# Patient Record
Sex: Male | Born: 1950
Health system: Southern US, Community
[De-identification: ages and names within clinical notes are randomized; demographics above are authoritative.]

## PROBLEM LIST (undated history)

## (undated) DIAGNOSIS — I499 Cardiac arrhythmia, unspecified: Secondary | ICD-10-CM

## (undated) DIAGNOSIS — N183 Chronic kidney disease, stage 3 unspecified: Secondary | ICD-10-CM

## (undated) DIAGNOSIS — I10 Essential (primary) hypertension: Secondary | ICD-10-CM

## (undated) DIAGNOSIS — I4891 Unspecified atrial fibrillation: Secondary | ICD-10-CM

## (undated) DIAGNOSIS — E785 Hyperlipidemia, unspecified: Secondary | ICD-10-CM

## (undated) DIAGNOSIS — M1990 Unspecified osteoarthritis, unspecified site: Secondary | ICD-10-CM

## (undated) DIAGNOSIS — E119 Type 2 diabetes mellitus without complications: Secondary | ICD-10-CM

## (undated) HISTORY — DX: Hyperlipidemia, unspecified: E78.5

## (undated) HISTORY — DX: Chronic kidney disease, stage 3 unspecified: N18.30

## (undated) HISTORY — PX: KIDNEY SURGERY: SHX687

---

## 2014-09-12 ENCOUNTER — Other Ambulatory Visit: Payer: Self-pay | Admitting: Physician Assistant

## 2014-09-12 DIAGNOSIS — M545 Low back pain: Secondary | ICD-10-CM

## 2014-09-15 ENCOUNTER — Other Ambulatory Visit: Payer: Self-pay

## 2017-10-26 ENCOUNTER — Encounter (HOSPITAL_COMMUNITY): Payer: Self-pay | Admitting: Emergency Medicine

## 2017-10-26 ENCOUNTER — Ambulatory Visit (HOSPITAL_COMMUNITY)
Admission: EM | Admit: 2017-10-26 | Discharge: 2017-10-26 | Disposition: A | Discharge status: Home / Self Care | Payer: 59 | Attending: Family Medicine | Admitting: Family Medicine

## 2017-10-26 ENCOUNTER — Ambulatory Visit (INDEPENDENT_AMBULATORY_CARE_PROVIDER_SITE_OTHER): Payer: 59

## 2017-10-26 DIAGNOSIS — M25561 Pain in right knee: Secondary | ICD-10-CM

## 2017-10-26 DIAGNOSIS — M25551 Pain in right hip: Secondary | ICD-10-CM

## 2017-10-26 DIAGNOSIS — M1711 Unilateral primary osteoarthritis, right knee: Secondary | ICD-10-CM

## 2017-10-26 HISTORY — DX: Unspecified osteoarthritis, unspecified site: M19.90

## 2017-10-26 HISTORY — DX: Type 2 diabetes mellitus without complications: E11.9

## 2017-10-26 HISTORY — DX: Essential (primary) hypertension: I10

## 2017-10-26 MED ORDER — CYCLOBENZAPRINE HCL 5 MG PO TABS: 5.0000 mg | ORAL_TABLET | Freq: Three times a day (TID) | ORAL | 0 refills | Status: DC | PRN

## 2017-10-26 MED ORDER — HYDROCODONE-ACETAMINOPHEN 7.5-325 MG PO TABS: 1.0000 | ORAL_TABLET | Freq: Four times a day (QID) | ORAL | 0 refills | Status: DC | PRN

## 2017-10-26 NOTE — Discharge Instructions (Signed)
Wear the knee immobilizer at all times that you are ambulatory/walking This will prevent another fall if your knee happens to get up Call your orthopedic in the morning to be seen this week Take pain medicine as needed.  Caution drowsiness.  Caution constipation. Take the muscle relaxer as needed for muscle spasm.  Caution drowsiness. Use ice on the injured knee to help take down the warmth and swelling

## 2017-10-26 NOTE — ED Triage Notes (Signed)
Pt states he has stage 3 arthritis in his R knee, c/o chronic pain, states he was walking and his R knee gave out and he fell on his bottom. Pt c/o severe R knee pain.

## 2017-10-26 NOTE — ED Provider Notes (Signed)
[facility]    CSN: [id number] Arrival date & time: 10/26/17  1809     History   Chief Complaint Chief Complaint  Patient presents with  . Appointment    6:18PM  . Knee Pain    HPI Calvin Schultz is a 67 y.o. male.   HPI  [name] has known arthritis in his knees.  Right greater than left.  He states his right knee has arthritis "bone-on-bone".  It is painful on a daily basis.  He was told 5 years ago that has needed to be replaced.  He states that a couple of days ago his right hip became painful.  No fall or injury.  No overuse.  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  He stayed home from work because of his pain, and was walking across the floor towards his kitchen.  Without provocation, his right knee buckled and gave out on him and he fell backwards landing on his backside.  He had some difficulty getting back up off of the floor that because of the pain in his hip and knee.  This happened yesterday.  Today the hip pain is slightly better, the knee pain is worse.  He cannot comfortably put weight on the right leg.  He has swelling in the knee, warmth in the knee, and swelling all the way down the leg to the ankle. He is an insulin-dependent diabetic.  He states his diabetes is well controlled.  He is compliant with his treatment.  He takes blood pressure medication. He called his orthopedic surgeon.  They are unable to see him today or tomorrow.  He is here for evaluation. Past Medical History:  Diagnosis Date  . Arthritis   . Diabetes mellitus without complication (HCC)   . Hypertension     There are no active problems to display for this patient.   Past Surgical History:  Procedure Laterality Date  . KIDNEY SURGERY         Home Medications    Prior to Admission medications   Medication Sig Start Date End Date Taking? Authorizing Provider  insulin aspart (NOVOLOG) 100 UNIT/ML injection  Inject into the skin 3 (three) times daily before meals.   Yes [provider]  insulin glargine (LANTUS) 100 UNIT/ML injection Inject into the skin at bedtime.   Yes [provider]  LISINOPRIL PO Take by mouth.   Yes [provider]  cyclobenzaprine (FLEXERIL) 5 MG tablet Take 1 tablet (5 mg total) by mouth 3 (three) times daily as needed for muscle spasms. 10/26/17   Calvin Moore, MD  HYDROcodone-acetaminophen (NORCO) 7.5-325 MG tablet Take 1 tablet by mouth every 6 (six) hours as needed for moderate pain. 10/26/17   Calvin Moore, MD    Family History No family history on file.  Social History Social History   Tobacco Use  . Smoking status: Not on file  Substance Use Topics  . Alcohol use: Not on file  . Drug use: Not on file     Allergies   Patient has no known allergies.   Review of Systems Review of Systems  Constitutional: Negative for chills and fever.  HENT: Negative for ear pain and sore throat.   Eyes: Negative for pain and visual disturbance.  Respiratory: Negative for cough and shortness of breath.   Cardiovascular: Negative for chest pain and palpitations.  Gastrointestinal: Negative for abdominal pain  and vomiting.  Genitourinary: Negative for dysuria and hematuria.  Musculoskeletal: Positive for arthralgias and gait problem. Negative for back pain.  Skin: Negative for color change and rash.  Neurological: Negative for seizures, syncope and headaches.  Psychiatric/Behavioral: Positive for sleep disturbance.       Pain kept him awake  All other systems reviewed and are negative.    Physical Exam Triage Vital Signs ED Triage Vitals [10/26/17 1827]  Enc Vitals Group     BP (!) 180/92     Pulse Rate 77     Resp 18     Temp 98.4 F (36.9 C)     Temp src      SpO2 97 %     Weight      Height      Head Circumference      Peak Flow      Pain Score      Pain Loc      Pain Edu?      Excl. in GC?    No data  found.  Updated Vital Signs BP (!) 180/92   Pulse 77   Temp 98.4 F (36.9 C)   Resp 18   SpO2 97%      Physical Exam  Constitutional: He appears well-developed and well-nourished. No distress.  Appears uncomfortable  HENT:  Head: Normocephalic and atraumatic.  Mouth/Throat: Oropharynx is clear and moist.  Eyes: Pupils are equal, round, and reactive to light. Conjunctivae are normal.  Neck: Normal range of motion.  Cardiovascular: Normal rate.  Pulmonary/Chest: Effort normal. No respiratory distress.  Abdominal: Soft. He exhibits no distension.  Musculoskeletal: Normal range of motion. He exhibits no edema.  Very antalgic.  Patient in wheelchair.  Can hardly put weight on right leg.  Right hip has no tenderness over the posterior pelvis, or greater trochanter.  Moderate hip pain with range of motion and external rotation.  Right knee has moderate effusion.  No tenderness to palpation.  Warmth.  No instability to examination but examination is limited due to patient's pain and large body habitus.  Neurological: He is alert.  Skin: Skin is warm and dry.     UC Treatments / Results  Labs (all labs ordered are listed, but only abnormal results are displayed) Labs Reviewed - No data to display  EKG None  Radiology Dg Knee Complete 4 Views Right  Result Date: 10/26/2017 CLINICAL DATA:  Knee pain after recent fall. EXAM: RIGHT KNEE - COMPLETE 4+ VIEW COMPARISON:  None. FINDINGS: Tricompartmental osteoarthritis with marked joint space narrowing and spurring is noted of the femorotibial and patellofemoral compartments. No significant joint effusion. No fracture or suspicious osseous lesions. Phleboliths are present along the posterior aspect of the proximal calf. IMPRESSION: Marked tricompartmental osteoarthritis of the right knee. Electronically Signed   By: Tollie Ethavid  Kwon M.D.   On: 10/26/2017 19:28   Dg Hip Unilat W Or Wo Pelvis 2-3 Views Right  Result Date: 10/26/2017 CLINICAL  DATA:  Patient fell this morning at the right hip and knee. Pain with nonweightbearing. EXAM: DG HIP (WITH OR WITHOUT PELVIS) 2-3V RIGHT COMPARISON:  None. FINDINGS: AP view the pelvis as well as AP and frog-leg views of the right hip are provided. The lower lumbar spine is maintained without significant disc flattening. There is mild facet arthropathy at L5-S1. No pelvic diastasis or fracture. Pubic rami are intact bilaterally. Femoral heads are seated within their respective acetabular components. No flattening of the femoral heads. No fracture of the  proximal femora. Small os acetabuli are noted bilaterally. IMPRESSION: No acute fracture or malalignment of either hip. Intact bony pelvis. Electronically Signed   By: Tollie Eth M.D.   On: 10/26/2017 19:27    Procedures Procedures (including critical care time)  Medications Ordered in UC Medications - No data to display  Initial Impression / Assessment and Plan / UC Course  I have reviewed the triage vital signs and the nursing notes.  Pertinent labs & imaging results that were available during my care of the patient were reviewed by me and considered in my medical decision making (see chart for details).      Final Clinical Impressions(s) / UC Diagnoses   Final diagnoses:  Acute pain of right knee  Osteoarthrosis, localized, primary, knee, right  Acute hip pain, right     Discharge Instructions     Wear the knee immobilizer at all times that you are ambulatory/walking This will prevent another fall if your knee happens to get up Call your orthopedic in the morning to be seen this week Take pain medicine as needed.  Caution drowsiness.  Caution constipation. Take the muscle relaxer as needed for muscle spasm.  Caution drowsiness. Use ice on the injured knee to help take down the warmth and swelling    ED Prescriptions    Medication Sig Dispense Auth. Provider   HYDROcodone-acetaminophen (NORCO) 7.5-325 MG tablet Take 1 tablet  by mouth every 6 (six) hours as needed for moderate pain. 20 tablet Calvin Moore, MD   cyclobenzaprine (FLEXERIL) 5 MG tablet Take 1 tablet (5 mg total) by mouth 3 (three) times daily as needed for muscle spasms. 30 tablet Calvin Moore, MD     Controlled Substance Prescriptions [place] Controlled Substance Registry consulted? Yes, I have consulted the [place] Controlled Substances Registry for this patient, and feel the risk/benefit ratio today is favorable for proceeding with this prescription for a controlled substance.  Prior prescriptions   Calvin Moore, MD 10/26/17 2047

## 2017-11-11 ENCOUNTER — Other Ambulatory Visit: Payer: Self-pay | Admitting: Family Medicine

## 2017-11-11 DIAGNOSIS — M1711 Unilateral primary osteoarthritis, right knee: Secondary | ICD-10-CM

## 2019-03-25 ENCOUNTER — Telehealth: Payer: Self-pay | Admitting: Hematology and Oncology

## 2019-03-25 NOTE — Telephone Encounter (Signed)
A new hem appt has been scheduled for Calvin Schultz to see Dr. Lorenso Courier on 12/2 at 2pm. Pt aware to arrive 15 minutes early.

## 2019-04-05 NOTE — Progress Notes (Deleted)
[place] [phone]   [phone]  INITIAL CONSULT NOTE  Patient Care Team: [facility] PCP - General (Family Medicine)  Hematological/Oncological History # ***  CHIEF COMPLAINTS/PURPOSE OF CONSULTATION:  "*** "  HISTORY OF PRESENTING ILLNESS:  Calvin Schultz 68 y.o. male with medical history significant for ***  MEDICAL HISTORY:  Past Medical History:  Diagnosis Date  . Arthritis   . Diabetes mellitus without complication ([place])   . Hypertension     SURGICAL HISTORY: Past Surgical History:  Procedure Laterality Date  . KIDNEY SURGERY      SOCIAL HISTORY: Social History   Socioeconomic History  . Marital status: Married    Spouse name: Not on file  . Number of children: Not on file  . Years of education: Not on file  . Highest education level: Not on file  Occupational History  . Not on file  Social Needs  . Financial resource strain: Not on file  . Food insecurity    Worry: Not on file    Inability: Not on file  . Transportation needs    Medical: Not on file    Non-medical: Not on file  Tobacco Use  . Smoking status: Not on file  Substance and Sexual Activity  . Alcohol use: Not on file  . Drug use: Not on file  . Sexual activity: Not on file  Lifestyle  . Physical activity    Days per week: Not on file    Minutes per session: Not on file  . Stress: Not on file  Relationships  . Social Herbalist on phone: Not on file    Gets together: Not on file    Attends religious service: Not on file    Active member of club or organization: Not on file    Attends meetings of clubs or organizations: Not on file    Relationship status: Not on file  . Intimate partner violence    Fear of current or ex partner: Not on file    Emotionally abused: Not on file    Physically abused: Not on file    Forced sexual activity: Not on file  Other Topics Concern  . Not on file  Social  History Narrative  . Not on file    FAMILY HISTORY: No family history on file.  ALLERGIES:  has No Known Allergies.  MEDICATIONS:  Current Outpatient Medications  Medication Sig Dispense Refill  . cyclobenzaprine (FLEXERIL) 5 MG tablet Take 1 tablet (5 mg total) by mouth 3 (three) times daily as needed for muscle spasms. 30 tablet 0  . HYDROcodone-acetaminophen (NORCO) 7.5-325 MG tablet Take 1 tablet by mouth every 6 (six) hours as needed for moderate pain. 20 tablet 0  . insulin aspart (NOVOLOG) 100 UNIT/ML injection Inject into the skin 3 (three) times daily before meals.    . insulin glargine (LANTUS) 100 UNIT/ML injection Inject into the skin at bedtime.    Marland Kitchen LISINOPRIL PO Take by mouth.     No current facility-administered medications for this visit.     REVIEW OF SYSTEMS:   Constitutional: ( - ) fevers, ( - )  chills , ( - ) night sweats Eyes: ( - ) blurriness of vision, ( - ) double vision, ( - ) watery eyes Ears, nose, mouth, throat, and face: ( - ) mucositis, ( - ) sore throat Respiratory: ( - ) cough, ( - ) dyspnea, ( - ) wheezes  Cardiovascular: ( - ) palpitation, ( - ) chest discomfort, ( - ) lower extremity swelling Gastrointestinal:  ( - ) nausea, ( - ) heartburn, ( - ) change in bowel habits Skin: ( - ) abnormal skin rashes Lymphatics: ( - ) new lymphadenopathy, ( - ) easy bruising Neurological: ( - ) numbness, ( - ) tingling, ( - ) new weaknesses Behavioral/Psych: ( - ) mood change, ( - ) new changes  All other systems were reviewed with the patient and are negative.  PHYSICAL EXAMINATION: ECOG PERFORMANCE STATUS: {CHL ONC ECOG PS:[id number]}  There were no vitals filed for this visit. There were no vitals filed for this visit.  GENERAL: well appearing *** in NAD  SKIN: skin color, texture, turgor are normal, no rashes or significant lesions EYES: conjunctiva are pink and non-injected, sclera clear OROPHARYNX: no exudate, no erythema; lips, buccal mucosa, and  tongue normal  NECK: supple, non-tender LYMPH:  no palpable lymphadenopathy in the cervical, axillary or inguinal LUNGS: clear to auscultation and percussion with normal breathing effort HEART: regular rate & rhythm and no murmurs and no lower extremity edema ABDOMEN: soft, non-tender, non-distended, normal bowel sounds Musculoskeletal: no cyanosis of digits and no clubbing  PSYCH: alert & oriented x 3, fluent speech NEURO: no focal motor/sensory deficits  LABORATORY DATA:  I have reviewed the data as listed No results found for: WBC, HGB, HCT, MCV, PLT, NEUTROABS  PATHOLOGY: ***  BLOOD FILM: *** Review of the peripheral blood smear showed normal appearing white cells with neutrophils that were appropriately lobated and granulated. There was no predominance of bi-lobed or hyper-segmented neutrophils appreciated. No Dohle bodies were noted. There was no left shifting, immature forms or blasts noted. Lymphocytes remain normal in size without any predominance of large granular lymphocytes. Red cells show no anisopoikilocytosis, macrocytes , microcytes or polychromasia. There were no schistocytes, target cells, echinocytes, acanthocytes, dacrocytes, or stomatocytes.There was no rouleaux formation, nucleated red cells, or intra-cellular inclusions noted. The platelets are normal in size, shape, and color without any clumping evident.  RADIOGRAPHIC STUDIES: I have personally reviewed the radiological images as listed and agreed with the findings in the report. No results found.  ASSESSMENT & PLAN ***  No orders of the defined types were placed in this encounter.   All questions were answered. The patient knows to call the clinic with any problems, questions or concerns.  A total of more than {CHL ONC TIME VISIT - [facility]:[id number]} were spent on this encounter and over half of that time was spent on counseling and coordination of care as outlined above.   Ulysees Barns, MD Department of  Hematology/Oncology [facility] Phone: [phone] Pager: [phone] Email: [email or web address]  04/05/2019 6:29 PM

## 2019-04-06 ENCOUNTER — Inpatient Hospital Stay: Payer: 59 | Attending: Hematology and Oncology | Admitting: Hematology and Oncology

## 2019-04-06 ENCOUNTER — Inpatient Hospital Stay: Payer: 59

## 2019-09-22 IMAGING — DX DG KNEE COMPLETE 4+V*R*
4 series · 4 of 4 positions shown · non-contrast
Comparison: None.

CLINICAL DATA: Knee pain after recent fall.

EXAM:
RIGHT KNEE - COMPLETE 4+ VIEW

[knee ap]
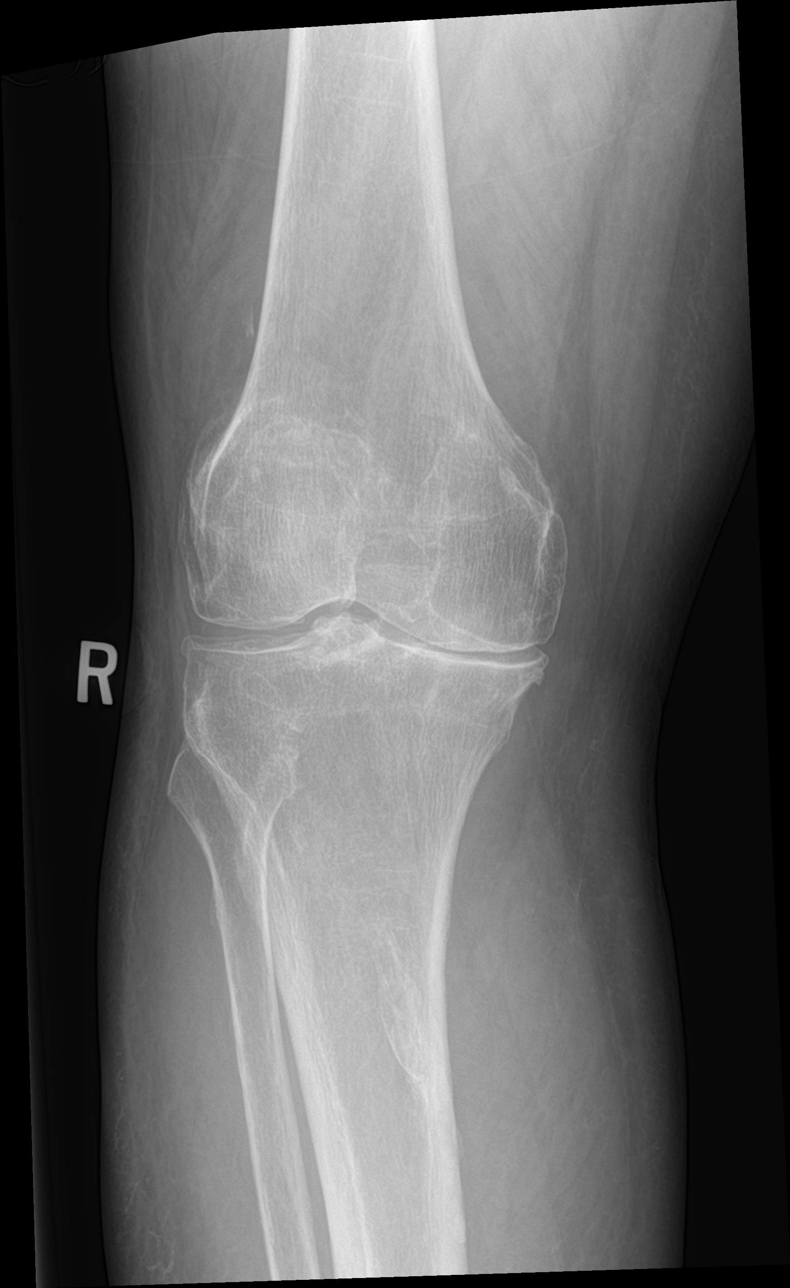

[knee obl (1 of 2)]
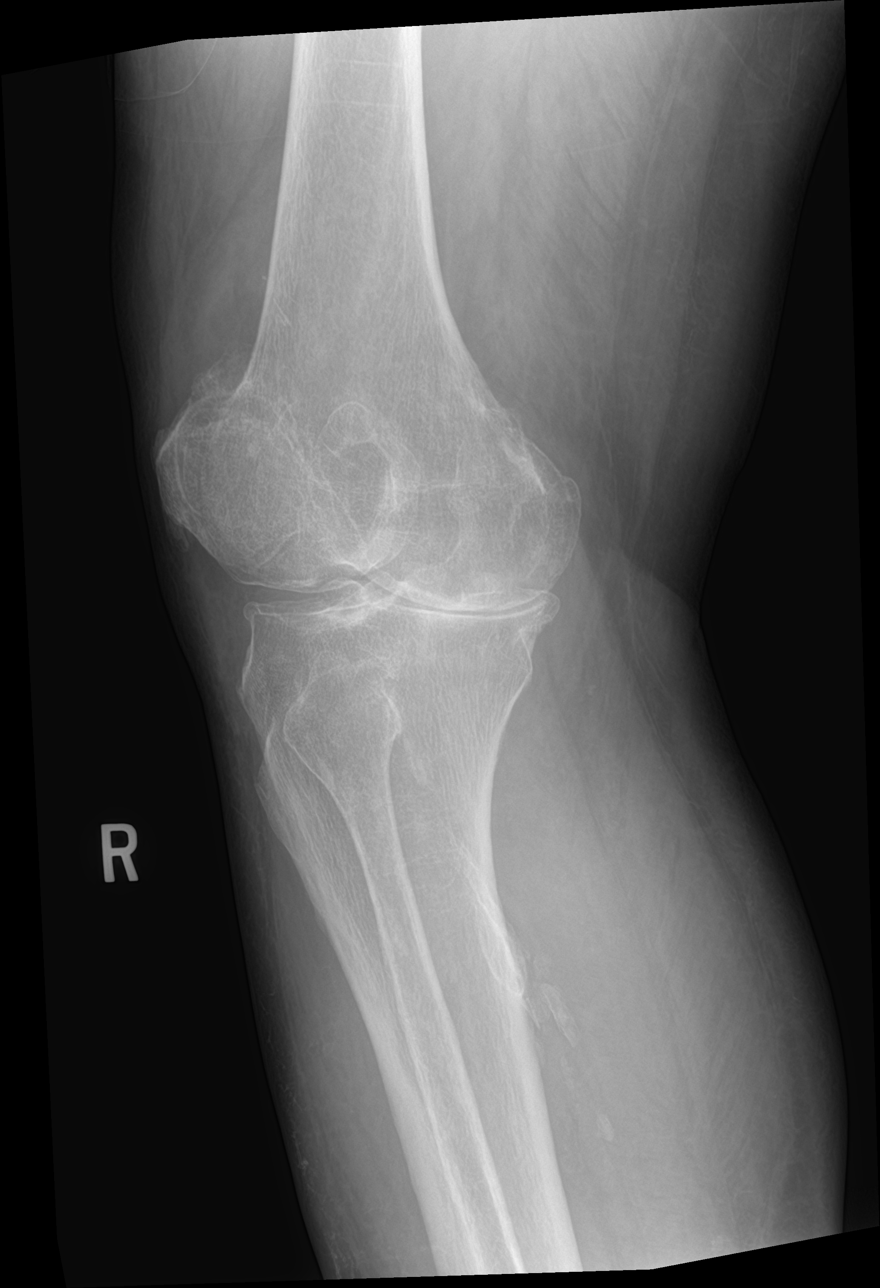

[knee obl (2 of 2)]
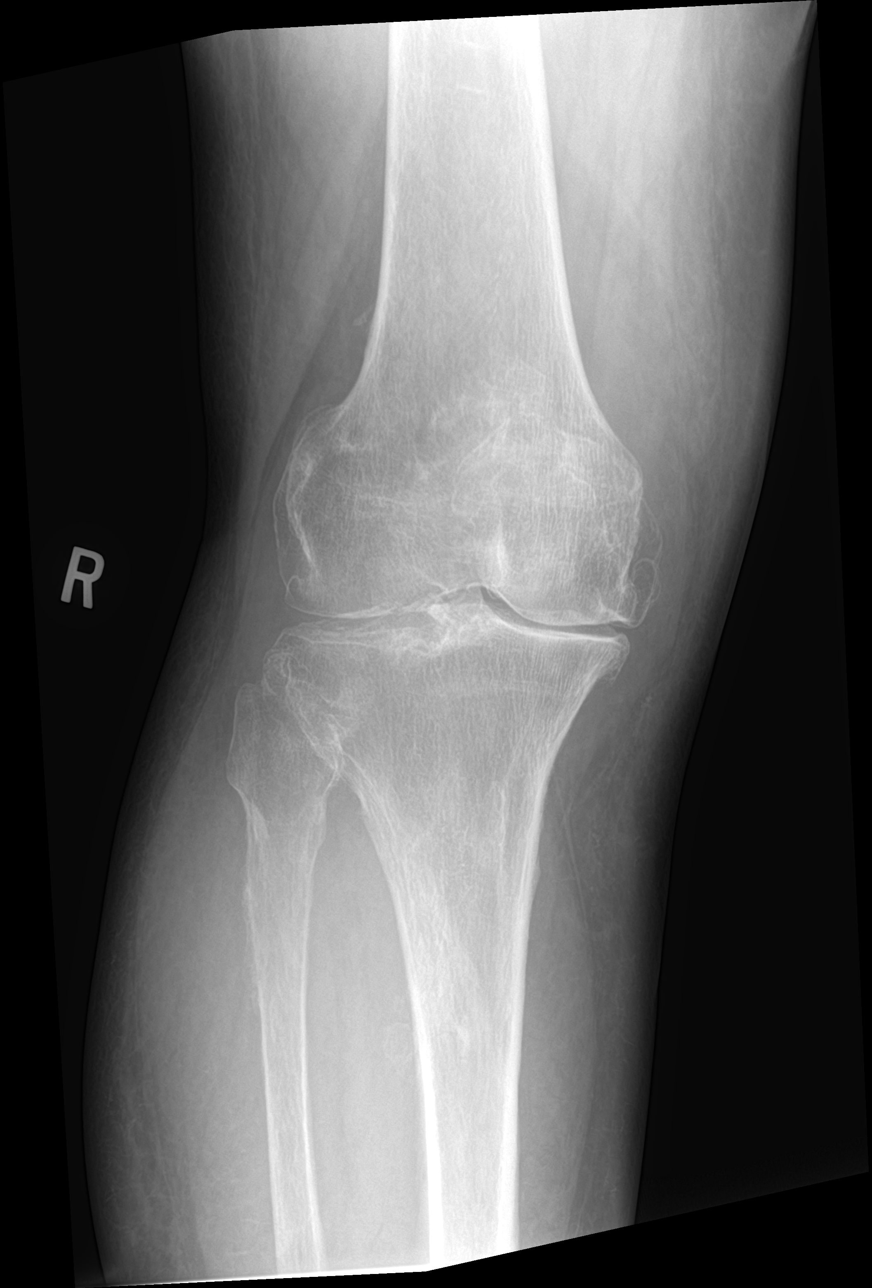

[knee lat]
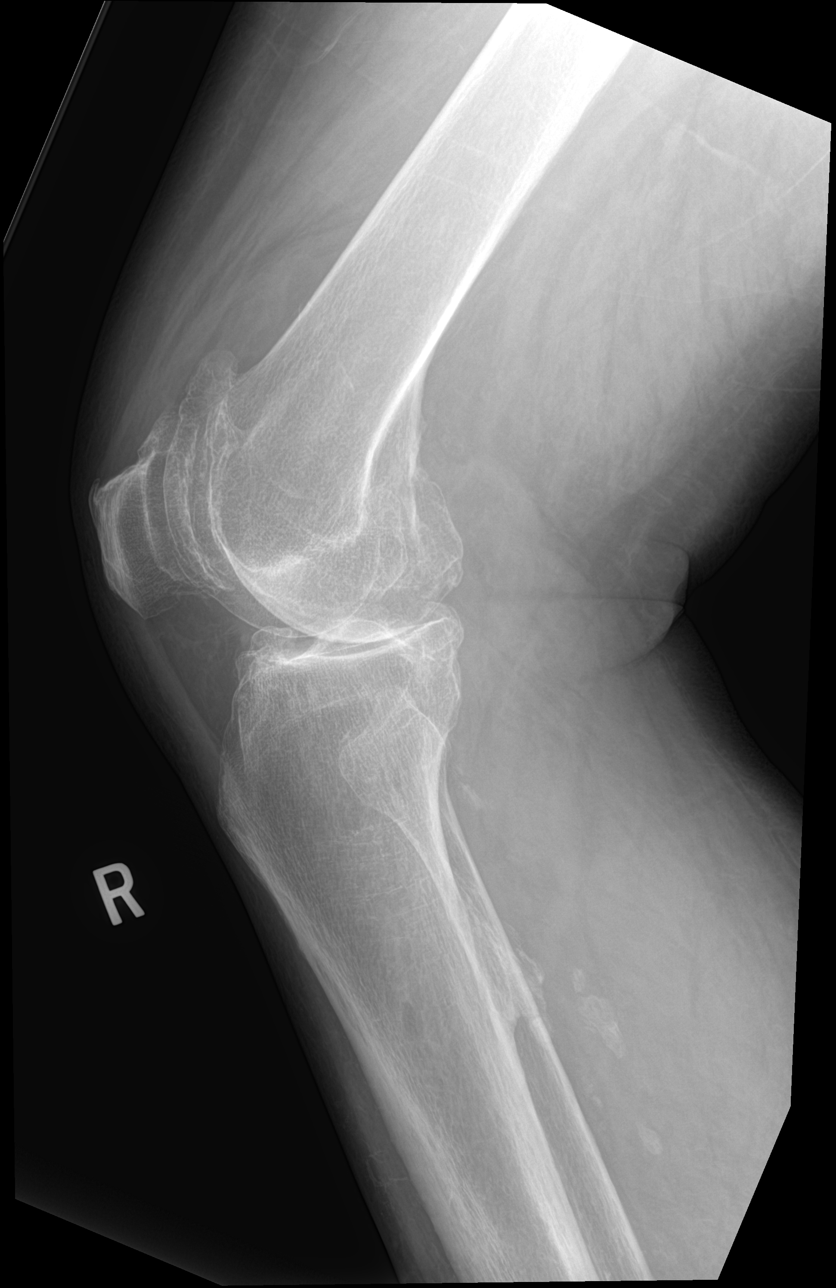

[4 of 4 positions shown; findings below may reference images not displayed]

FINDINGS: Tricompartmental osteoarthritis with marked joint space narrowing
and spurring is noted of the femorotibial and patellofemoral
compartments. No significant joint effusion. No fracture or
suspicious osseous lesions. Phleboliths are present along the
posterior aspect of the proximal calf.
IMPRESSION: Marked tricompartmental osteoarthritis of the right knee.

## 2020-01-20 ENCOUNTER — Ambulatory Visit: Payer: 59 | Admitting: Podiatry

## 2020-01-30 ENCOUNTER — Ambulatory Visit (INDEPENDENT_AMBULATORY_CARE_PROVIDER_SITE_OTHER): Payer: No Typology Code available for payment source | Admitting: Podiatry

## 2020-01-30 ENCOUNTER — Encounter: Payer: Self-pay | Admitting: Podiatry

## 2020-01-30 ENCOUNTER — Other Ambulatory Visit: Payer: Self-pay

## 2020-01-30 DIAGNOSIS — B351 Tinea unguium: Secondary | ICD-10-CM

## 2020-01-30 DIAGNOSIS — M79675 Pain in left toe(s): Secondary | ICD-10-CM | POA: Diagnosis not present

## 2020-01-30 DIAGNOSIS — L309 Dermatitis, unspecified: Secondary | ICD-10-CM | POA: Diagnosis not present

## 2020-01-30 DIAGNOSIS — M79674 Pain in right toe(s): Secondary | ICD-10-CM | POA: Diagnosis not present

## 2020-01-30 NOTE — Patient Instructions (Signed)
Diabetes Mellitus and Foot Care Foot care is an important part of your health, especially when you have diabetes. Diabetes may cause you to have problems because of poor blood flow (circulation) to your feet and legs, which can cause your skin to:  Become thinner and drier.  Break more easily.  Heal more slowly.  Peel and crack. You may also have nerve damage (neuropathy) in your legs and feet, causing decreased feeling in them. This means that you may not notice minor injuries to your feet that could lead to more serious problems. Noticing and addressing any potential problems early is the best way to prevent future foot problems. How to care for your feet Foot hygiene  Wash your feet daily with warm water and mild soap. Do not use hot water. Then, pat your feet and the areas between your toes until they are completely dry. Do not soak your feet as this can dry your skin.  Trim your toenails straight across. Do not dig under them or around the cuticle. File the edges of your nails with an emery board or nail file.  Apply a moisturizing lotion or petroleum jelly to the skin on your feet and to dry, brittle toenails. Use lotion that does not contain alcohol and is unscented. Do not apply lotion between your toes. Shoes and socks  Wear clean socks or stockings every day. Make sure they are not too tight. Do not wear knee-high stockings since they may decrease blood flow to your legs.  Wear shoes that fit properly and have enough cushioning. Always look in your shoes before you put them on to be sure there are no objects inside.  To break in new shoes, wear them for just a few hours a day. This prevents injuries on your feet. Wounds, scrapes, corns, and calluses  Check your feet daily for blisters, cuts, bruises, sores, and redness. If you cannot see the bottom of your feet, use a mirror or ask someone for help.  Do not cut corns or calluses or try to remove them with medicine.  If you  find a minor scrape, cut, or break in the skin on your feet, keep it and the skin around it clean and dry. You may clean these areas with mild soap and water. Do not clean the area with peroxide, alcohol, or iodine.  If you have a wound, scrape, corn, or callus on your foot, look at it several times a day to make sure it is healing and not infected. Check for: ? Redness, swelling, or pain. ? Fluid or blood. ? Warmth. ? Pus or a bad smell. General instructions  Do not cross your legs. This may decrease blood flow to your feet.  Do not use heating pads or hot water bottles on your feet. They may burn your skin. If you have lost feeling in your feet or legs, you may not know this is happening until it is too late.  Protect your feet from hot and cold by wearing shoes, such as at the beach or on hot pavement.  Schedule a complete foot exam at least once a year (annually) or more often if you have foot problems. If you have foot problems, report any cuts, sores, or bruises to your health care provider immediately. Contact a health care provider if:  You have a medical condition that increases your risk of infection and you have any cuts, sores, or bruises on your feet.  You have an injury that is not   healing.  You have redness on your legs or feet.  You feel burning or tingling in your legs or feet.  You have pain or cramps in your legs and feet.  Your legs or feet are numb.  Your feet always feel cold.  You have pain around a toenail. Get help right away if:  You have a wound, scrape, corn, or callus on your foot and: ? You have pain, swelling, or redness that gets worse. ? You have fluid or blood coming from the wound, scrape, corn, or callus. ? Your wound, scrape, corn, or callus feels warm to the touch. ? You have pus or a bad smell coming from the wound, scrape, corn, or callus. ? You have a fever. ? You have a red line going up your leg. Summary  Check your feet every day  for cuts, sores, red spots, swelling, and blisters.  Moisturize feet and legs daily.  Wear shoes that fit properly and have enough cushioning.  If you have foot problems, report any cuts, sores, or bruises to your health care provider immediately.  Schedule a complete foot exam at least once a year (annually) or more often if you have foot problems. This information is not intended to replace advice given to you by your health care provider. Make sure you discuss any questions you have with your health care provider. Document Revised: 01/12/2019 Document Reviewed: 05/23/2016 Elsevier Patient Education  2020 [employer]  

## 2020-01-30 NOTE — Progress Notes (Signed)
Subjective:   Patient ID: Calvin Schultz, male   DOB: 69 y.o.   MRN: [id number]   HPI Patient is a long-term diabetic with neuropathic-like symptomatology and numbness who has nail disease 1-5 both feet that get thick and distal formed and states that at times he cannot take care of them or his family and is concerned about his neuropathy.  Also complains of tissue that gets irritated and red.  Patient does not smoke likes to be active   Review of Systems  All other systems reviewed and are negative.       Objective:  Physical Exam Vitals and nursing note reviewed.  Constitutional:      Appearance: He is well-developed.  Pulmonary:     Effort: Pulmonary effort is normal.  Musculoskeletal:        General: Normal range of motion.  Skin:    General: Skin is warm.  Neurological:     Mental Status: He is alert.     Neurovascular status found to be intact but does have diminishment of sharp dull vibratory bilateral with redness around both feet in a moccasin-like appearance with dry type skin.  He is also noted to have thick yellow brittle nailbeds 1-5 both feet that are tender when pressed and make shoe gear difficult and states he tries to take care of them but is worried about doing it due to his diabetes and neuropathic-like condition.  Patient has good digital perfusion and he is well oriented     Assessment:  Mycotic nail infection 1-5 both feet long-term diabetes and what appears to be moderate fungal infection     Plan:  H&P diabetic foot education given to patient and discussed and I debrided nailbeds 1-5 both feet with no iatrogenic bleeding.  As far as his skin goes I do not recommend oral medicine but did discuss possible Lamisil we also discussed medicine if he were start to develop burning or shooting pain that is causing sleep issues or more chronic like discomfort.  Patient will be seen back as needed

## 2021-04-19 ENCOUNTER — Other Ambulatory Visit: Payer: Self-pay

## 2021-04-19 ENCOUNTER — Ambulatory Visit (INDEPENDENT_AMBULATORY_CARE_PROVIDER_SITE_OTHER): Payer: No Typology Code available for payment source

## 2021-04-19 ENCOUNTER — Ambulatory Visit (INDEPENDENT_AMBULATORY_CARE_PROVIDER_SITE_OTHER): Payer: No Typology Code available for payment source | Admitting: Podiatry

## 2021-04-19 DIAGNOSIS — L97512 Non-pressure chronic ulcer of other part of right foot with fat layer exposed: Secondary | ICD-10-CM

## 2021-04-19 DIAGNOSIS — E11621 Type 2 diabetes mellitus with foot ulcer: Secondary | ICD-10-CM | POA: Diagnosis not present

## 2021-04-19 DIAGNOSIS — L97412 Non-pressure chronic ulcer of right heel and midfoot with fat layer exposed: Secondary | ICD-10-CM | POA: Diagnosis not present

## 2021-04-19 MED ORDER — SILVER SULFADIAZINE 1 % EX CREA: TOPICAL_CREAM | CUTANEOUS | 0 refills | Status: DC

## 2021-04-19 MED ORDER — KETOCONAZOLE 2 % EX CREA: TOPICAL_CREAM | CUTANEOUS | 0 refills | Status: DC

## 2021-04-19 MED ORDER — DOXYCYCLINE HYCLATE 100 MG PO TABS: 100.0000 mg | ORAL_TABLET | Freq: Two times a day (BID) | ORAL | 0 refills | Status: DC

## 2021-04-19 NOTE — Progress Notes (Signed)
°  Subjective:  Patient ID: Calvin Schultz, male    DOB: 09/26/1950,  MRN: [id number]  Chief Complaint  Patient presents with   Foot Ulcer    Right first submet   70 y.o. male presents for wound care. Hx confirmed with patient. States he has had this ulcer for a few weeks. Unsure how this started. Denies N/V/F/Ch. Objective:  Physical Exam: Wound Location: right submet 1 Wound Measurement: 1.5x1.5 post-debridement Wound Base: Granular/Healthy Peri-wound: Calloused Exudate: wound dry + induration, + tenderness, and no undermining Foot warm and well perfused, pedal pulses palpable.  No images are attached to the encounter.  Radiographs:  X-ray of the right foot: no soft tissue emphysema, no signs of osteomyelitis; mild degenerative changes 1st metatarsophalangeal joint. Assessment:   1. Diabetic ulcer of right midfoot associated with type 2 diabetes mellitus, with fat layer exposed (HCC)    Plan:  Patient was evaluated and treated and all questions answered.  Ulcer right foot -XR reviewed with patient -Wound cleansed and debrided -Dressing applied consisting of silvadene, sterile gauze, kerlix, and ACE bandage -Offload ulcer with CAM boot -CAM boot dispensed  Procedure: Excisional Debridement of Wound Indication: Removal of non-viable soft tissue from the wound to promote healing.  Anesthesia: none Pre-Debridement Wound Measurements: 1 cm x 1 cm x 0.3 cm  Post-Debridement Wound Measurements: 1.5x1.5 x 0.3 cm  Type of Debridement: Sharp Excisional Tissue Removed: Non-viable soft tissue Instrumentation: 15 blade and tissue nipper Depth of Debridement: subcutaneous tissue. Technique: Sharp excisional debridement to bleeding, viable wound base.  Dressing: Dry, sterile, compression dressing. Disposition: Patient tolerated procedure well.  Return in about 2 weeks (around 05/03/2021) for Wound Care.

## 2021-05-10 ENCOUNTER — Other Ambulatory Visit: Payer: Self-pay

## 2021-05-10 ENCOUNTER — Ambulatory Visit (INDEPENDENT_AMBULATORY_CARE_PROVIDER_SITE_OTHER): Payer: No Typology Code available for payment source | Admitting: Podiatry

## 2021-05-10 DIAGNOSIS — L97412 Non-pressure chronic ulcer of right heel and midfoot with fat layer exposed: Secondary | ICD-10-CM | POA: Diagnosis not present

## 2021-05-10 DIAGNOSIS — E11621 Type 2 diabetes mellitus with foot ulcer: Secondary | ICD-10-CM

## 2021-05-13 NOTE — Progress Notes (Signed)
°  Subjective:  Patient ID: Calvin Schultz, male    DOB: 1951-03-21,  MRN: [id number]  Chief Complaint  Patient presents with   Diabetic Ulcer     Wound Care 2WK F/U   71 y.o. male presents for wound care. Hx confirmed with patient. Wound doing better per patient. Reports some clear dainage. Apply silvadene daily to wound. Oral temp 98.2 Objective:  Physical Exam: Wound Location: right submet 1 Wound Measurement: 1x1 Wound Base: Granular/Healthy Peri-wound: Calloused Exudate: wound dry + induration, + tenderness, and no undermining Foot warm and well perfused, pedal pulses palpable.  No images are attached to the encounter.  Radiographs:  X-ray of the right foot: no soft tissue emphysema, no signs of osteomyelitis; mild degenerative changes 1st metatarsophalangeal joint. Assessment:   1. Diabetic ulcer of right midfoot associated with type 2 diabetes mellitus, with fat layer exposed (HCC)     Plan:  Patient was evaluated and treated and all questions answered.  Ulcer right foot -Wound improving.  -Wound cleansed and minimally debrided -Dressing applied consisting of silvadene and foam border dressing. -Offload ulcer with CAM boot    No follow-ups on file.

## 2021-05-24 ENCOUNTER — Ambulatory Visit: Payer: No Typology Code available for payment source | Admitting: Podiatry

## 2021-05-28 ENCOUNTER — Other Ambulatory Visit: Payer: Self-pay

## 2021-05-28 ENCOUNTER — Ambulatory Visit (INDEPENDENT_AMBULATORY_CARE_PROVIDER_SITE_OTHER): Payer: No Typology Code available for payment source | Admitting: Podiatry

## 2021-05-28 DIAGNOSIS — E11621 Type 2 diabetes mellitus with foot ulcer: Secondary | ICD-10-CM

## 2021-05-28 DIAGNOSIS — L97412 Non-pressure chronic ulcer of right heel and midfoot with fat layer exposed: Secondary | ICD-10-CM | POA: Diagnosis not present

## 2021-05-28 NOTE — Patient Instructions (Signed)
I would recommend looking for compression socks that say 15 - 20 mmHg if the 20-30 mm Hg is too tight.

## 2021-05-28 NOTE — Progress Notes (Signed)
°  Subjective:  Patient ID: Calvin Schultz, male    DOB: Jan 26, 1951,  MRN: [id number]  No chief complaint on file.  71 y.o. male presents for wound care. Hx confirmed with patient. Thinks the wound is doing better. Minimal clear drainage daily - changing daily with silvadene and gauze. Pain rated 4/10. Oral temperature 97.8 Objective:  Physical Exam: Wound Location: right submet 1 Wound Measurement: 1x1.5 Wound Base: Granular/Healthy Peri-wound: Calloused Exudate: wound dry + induration, + tenderness, and no undermining Foot warm and well perfused, pedal pulses palpable.  No images are attached to the encounter.  Radiographs:  X-ray of the right foot: no soft tissue emphysema, no signs of osteomyelitis; mild degenerative changes 1st metatarsophalangeal joint. Assessment:   1. Diabetic ulcer of right midfoot associated with type 2 diabetes mellitus, with fat layer exposed ([place])    Plan:  Patient was evaluated and treated and all questions answered.  Ulcer right foot -Wound slightly larger than previous. -Wound cleansed and debrided to healthy bleeding wound. -Start [employer] for collagen scaffold to promote healing. Applied today. -Will verify benefits for advanced wound therapy to promote healing. -Offload ulcer with CAM boot  -Dressing supplies ordered from [employer]  Procedure: Excisional Debridement of Wound Indication: Removal of non-viable soft tissue from the wound to promote healing.  Anesthesia: none Pre-Debridement Wound Measurements: 1 cm x 1 cm x 0.3 cm  Post-Debridement Wound Measurements: 1 cm x 1.5 cm x 0.3 cm  Type of Debridement: Sharp Excisional Tissue Removed: Non-viable soft tissue Instrumentation: 15 blade and tissue nipper Depth of Debridement: subcutaneous tissue. Technique: Sharp excisional debridement to bleeding, viable wound base.  Dressing: Dry, sterile, compression dressing. Disposition: Patient tolerated procedure well.  Return in about 3 weeks  (around 06/18/2021) for Wound Care.

## 2021-06-03 ENCOUNTER — Telehealth: Payer: Self-pay | Admitting: Podiatry

## 2021-06-03 NOTE — Telephone Encounter (Signed)
Patient was seen in the office on 05/28/21 he states that he was told that some supplies would be ordered for him. Patient has not heard or received anything at this time and was told to call back if has not heard anything in a few days. Please advise

## 2021-06-03 NOTE — Telephone Encounter (Signed)
Ammie can we call [facility] to ensure they got the order and check on the status of it?

## 2021-06-04 ENCOUNTER — Telehealth: Payer: Self-pay | Admitting: *Deleted

## 2021-06-04 NOTE — Telephone Encounter (Signed)
New order form filled out please send

## 2021-06-04 NOTE — Telephone Encounter (Signed)
Called [facility] and spoke with [place], do not have order but if refax tonight, will try to have to patient in 48 hours. Notified patient,verbalized understanding and said that he may have enough until then.

## 2021-06-04 NOTE — Telephone Encounter (Addendum)
Patient called again , he is using the last of the supplies he had, doesn't know what he is suppose to do until the rest of the supplies get there, do you have an update?  Patient is requesting a call back today if possible.

## 2021-06-04 NOTE — Telephone Encounter (Signed)
Patient called again regarding supplies have not shown up, supply company is saying they do not have order?

## 2021-06-05 NOTE — Telephone Encounter (Signed)
Faxed to [facility] 06/04/21

## 2021-06-05 NOTE — Telephone Encounter (Signed)
[facility] order sent,patient notified

## 2021-06-21 ENCOUNTER — Ambulatory Visit: Payer: No Typology Code available for payment source | Admitting: Podiatry

## 2021-06-24 ENCOUNTER — Other Ambulatory Visit: Payer: Self-pay

## 2021-06-24 ENCOUNTER — Ambulatory Visit (INDEPENDENT_AMBULATORY_CARE_PROVIDER_SITE_OTHER): Payer: No Typology Code available for payment source

## 2021-06-24 ENCOUNTER — Ambulatory Visit (INDEPENDENT_AMBULATORY_CARE_PROVIDER_SITE_OTHER): Payer: No Typology Code available for payment source | Admitting: Podiatry

## 2021-06-24 DIAGNOSIS — L97512 Non-pressure chronic ulcer of other part of right foot with fat layer exposed: Secondary | ICD-10-CM

## 2021-06-25 ENCOUNTER — Telehealth: Payer: No Typology Code available for payment source | Admitting: *Deleted

## 2021-06-25 ENCOUNTER — Ambulatory Visit: Payer: No Typology Code available for payment source

## 2021-06-25 DIAGNOSIS — L97512 Non-pressure chronic ulcer of other part of right foot with fat layer exposed: Secondary | ICD-10-CM

## 2021-06-25 DIAGNOSIS — L97412 Non-pressure chronic ulcer of right heel and midfoot with fat layer exposed: Secondary | ICD-10-CM

## 2021-06-25 DIAGNOSIS — E11621 Type 2 diabetes mellitus with foot ulcer: Secondary | ICD-10-CM

## 2021-06-25 NOTE — Progress Notes (Signed)
SITUATION Reason for Consult: Follow-up with right CAM boot Patient / Caregiver Report: Patient needs offloading  OBJECTIVE DATA History / Diagnosis:    ICD-10-CM   1. Diabetic ulcer of right midfoot associated with type 2 diabetes mellitus, with fat layer exposed (HCC)  E11.621    L97.412     2. Ulcer of right foot with fat layer exposed (HCC)  L97.512       Change in Pathology: None  ACTIONS PERFORMED Patient's equipment was checked for structural stability and fit. Offloaded cam boot and reinstructed patient and wife in donning and doffing. Device(s) intact and fit is excellent. All questions answered and concerns addressed.  PLAN Follow-up as needed (PRN). Plan of care discussed with and agreed upon by patient / caregiver.

## 2021-06-25 NOTE — Telephone Encounter (Signed)
Prior authorization is needed for the [employer] ABI with /without TBI. Please contact Duncan when completed([phone]).

## 2021-06-26 LAB — BASIC METABOLIC PANEL
BUN/Creatinine Ratio: 11 (ref 10–24)
BUN: 22 mg/dL (ref 8–27)
CO2: 22 mmol/L (ref 20–29)
Calcium: 9.8 mg/dL (ref 8.6–10.2)
Chloride: 102 mmol/L (ref 96–106)
Creatinine, Ser: 2 mg/dL — ABNORMAL HIGH (ref 0.76–1.27)
Glucose: 275 mg/dL — ABNORMAL HIGH (ref 70–99)
Potassium: 3.5 mmol/L (ref 3.5–5.2)
Sodium: 139 mmol/L (ref 134–144)
eGFR: 36 mL/min/{1.73_m2} — ABNORMAL LOW (ref >59)

## 2021-06-26 LAB — CBC WITH DIFFERENTIAL/PLATELET
Basophils Absolute: 0.1 10*3/uL (ref 0.0–0.2)
Basos: 0 %
EOS (ABSOLUTE): 0.2 10*3/uL (ref 0.0–0.4)
Eos: 1 %
Hematocrit: 41.2 % (ref 37.5–51.0)
Hemoglobin: 13.2 g/dL (ref 13.0–17.7)
Immature Grans (Abs): 0 10*3/uL (ref 0.0–0.1)
Immature Granulocytes: 0 %
Lymphocytes Absolute: 2.4 10*3/uL (ref 0.7–3.1)
Lymphs: 17 %
MCH: 26.8 pg (ref 26.6–33.0)
MCHC: 32 g/dL (ref 31.5–35.7)
MCV: 84 fL (ref 79–97)
Monocytes Absolute: 1 10*3/uL — ABNORMAL HIGH (ref 0.1–0.9)
Monocytes: 8 %
Neutrophils Absolute: 10.1 10*3/uL — ABNORMAL HIGH (ref 1.4–7.0)
Neutrophils: 74 %
Platelets: 367 10*3/uL (ref 150–450)
RBC: 4.92 x10E6/uL (ref 4.14–5.80)
RDW: 12.6 % (ref 11.6–15.4)
WBC: 13.7 10*3/uL — ABNORMAL HIGH (ref 3.4–10.8)

## 2021-06-26 LAB — SEDIMENTATION RATE: Sed Rate: 38 mm/hr — ABNORMAL HIGH (ref 0–30)

## 2021-06-26 LAB — C-REACTIVE PROTEIN: CRP: 8 mg/L (ref 0–10)

## 2021-06-27 ENCOUNTER — Other Ambulatory Visit: Payer: Self-pay | Admitting: Podiatry

## 2021-06-27 MED ORDER — DOXYCYCLINE HYCLATE 100 MG PO TABS: 100.0000 mg | ORAL_TABLET | Freq: Two times a day (BID) | ORAL | 0 refills | Status: DC

## 2021-06-27 NOTE — Telephone Encounter (Signed)
Hi leah, can you pre authorize this?

## 2021-06-29 NOTE — Progress Notes (Signed)
Subjective: 71 year old male presents the office today for follow-up evaluation of an ulcer on his right foot submetatarsal 1.  He is previous been under the care of Dr. Samuella Cota and his care is been transitioned to myself.  His wife has been keeping a close monitoring of the wound and she states it was doing better but seems to have stalled out recently.  Did not see any purulence or increasing swelling or redness.  Denies any fevers or chills.  No other concerns.  Objective: AAO x3, NAD DP/PT pulses palpable bilaterally, CRT less than 3 seconds Protective sensation decreased with Simms Weinstein monofilament On the right foot submetatarsal 1 is hyperkeratotic tissue with central granular wound.  After debridement the wound is granular and measures 1 x 0.7 x 0.3 cm.  There is no probing, undermining or tunneling.  There is no surrounding erythema, ascending cellulitis.  No fluctuation or crepitation but there is no malodor.  No pain with calf compression, swelling, warmth, erythema       Assessment: Chronic ulceration right foot  Plan: -All treatment options discussed with the patient including all alternatives, risks, complications.  -I sharply debrided the wound today utilizing #312 with scalpel to any complications.  I debrided nonviable devitalized tissue in order to promote wound healing.  There was minimal blood loss and hemostasis achieved through manual compression.  Cleansed the wound with saline.  Silvadene was applied followed by dressing. -Clinically the wound looks healthy and based on the previous notes the wound is smaller.  However I would order blood work today including CBC, CRP, BMP, sed rate.  If abnormal will order MRI.   -ABI also ordered. -Offloading.  I will have him follow-up with Arlys John for further offloading. -Monitor for any clinical signs or symptoms of infection and directed to call the office immediately should any occur or go to the ER. -Patient encouraged to  call the office with any questions, concerns, change in symptoms.   30 minutes were spent with the patient greater than 50% time was in face-to-face contact discussing treatment options and debridement of the wound.  Vivi Barrack DPM

## 2021-07-08 ENCOUNTER — Encounter: Payer: Self-pay | Admitting: Podiatry

## 2021-07-08 ENCOUNTER — Other Ambulatory Visit: Payer: Self-pay

## 2021-07-08 ENCOUNTER — Ambulatory Visit (INDEPENDENT_AMBULATORY_CARE_PROVIDER_SITE_OTHER): Payer: No Typology Code available for payment source | Admitting: Podiatry

## 2021-07-08 VITALS — Temp 97.5°F

## 2021-07-08 DIAGNOSIS — E11621 Type 2 diabetes mellitus with foot ulcer: Secondary | ICD-10-CM

## 2021-07-08 DIAGNOSIS — L97412 Non-pressure chronic ulcer of right heel and midfoot with fat layer exposed: Secondary | ICD-10-CM

## 2021-07-08 DIAGNOSIS — L97512 Non-pressure chronic ulcer of other part of right foot with fat layer exposed: Secondary | ICD-10-CM

## 2021-07-08 MED ORDER — DOXYCYCLINE HYCLATE 100 MG PO TABS: 100.0000 mg | ORAL_TABLET | Freq: Two times a day (BID) | ORAL | 0 refills | Status: DC

## 2021-07-08 NOTE — Progress Notes (Unsigned)
Subjective:  Denies any systemic complaints such as fevers, chills, nausea, vomiting. No acute changes since last appointment, and no other complaints at this time.   Objective: AAO x3, NAD DP/PT pulses palpable bilaterally, CRT less than 3 seconds Protective sensation intact with Simms Weinstein monofilament, vibratory sensation intact, Achilles tendon reflex intact No areas of pinpoint bony tenderness or pain with vibratory sensation. MMT 5/5, ROM WNL. No edema, erythema, increase in warmth to bilateral lower extremities.  No open lesions or pre-ulcerative lesions.  No pain with calf compression, swelling, warmth, erythema  Assessment:  Plan: -All treatment options discussed with the patient including all alternatives, risks, complications.   -Patient encouraged to call the office with any questions, concerns, change in symptoms.   *x-ray next appointment, repeat blood work if needed

## 2021-07-22 ENCOUNTER — Other Ambulatory Visit: Payer: Self-pay

## 2021-07-22 ENCOUNTER — Ambulatory Visit (INDEPENDENT_AMBULATORY_CARE_PROVIDER_SITE_OTHER): Payer: No Typology Code available for payment source | Admitting: Podiatry

## 2021-07-22 DIAGNOSIS — E11621 Type 2 diabetes mellitus with foot ulcer: Secondary | ICD-10-CM | POA: Diagnosis not present

## 2021-07-22 DIAGNOSIS — L97412 Non-pressure chronic ulcer of right heel and midfoot with fat layer exposed: Secondary | ICD-10-CM

## 2021-07-26 ENCOUNTER — Telehealth: Payer: Self-pay | Admitting: *Deleted

## 2021-07-26 NOTE — Telephone Encounter (Signed)
Para March w/ vascular and vein is asking for prior authorization vas [place] . Please contact him at [phone] to schedule when completed. ?

## 2021-07-29 NOTE — Progress Notes (Signed)
Subjective: ?71 year old male presents the office today for follow-up evaluation of wound on the plantar aspect the right foot submetatarsal 1.  States he has some minimal drainage but he has no pain.  He thinks the wound is getting smaller.  Denies any fevers or chills.  No nausea or vomiting.  No other concerns. ? ? ?Last A1c was 9.4 on July 19, 2021 ? ?Objective: ?AAO x3, NAD ?DP/PT pulses palpable bilaterally, CRT less than 3 seconds ?Ulceration of the right foot submetatarsal 1.  Today the wound measures after debridement 0.7 x 0.3 x 0.3 cm.  There is no probing, undermining or tunneling.  There is granulation tissue present but the wound is smaller.  There is no significant cellulitis noted.  There is no drainage or pus.  No fluctuation or crepitation.  There is no malodor.  ?No pain with calf compression, swelling, warmth, erythema ? ? ? ? ? ? ? ?Assessment: ?Ulceration right foot ? ?Plan: ?-All treatment options discussed with the patient including all alternatives, risks, complications.  ?-Clinically the foot appears to be doing better today and the wound is healing although slowly.  I sharply debrided the wound today utilizing a #312 with scalpel to remove any nonviable devitalized tissue in order to promote wound healing.  Sharp debridement to healthy, granular tissue.  There is minimal blood loss anemia states that she through manual compression.  Irrigated with saline and Silvadene was applied followed by dressing.  Continue daily dressing changes as well as Silvadene. ?-Reviewed blood work (06/25/2021) CRP was 8, sed rate slightly elevated at 38, white blood cell count was elevated at 13.7, creatinine 2 ?-ABI: pending ?-MRI: Pending ?-Continue offloading at all times ?-Patient encouraged to call the office with any questions, concerns, change in symptoms.  ? ?*Repeat blood work if needed ? ?Vivi Barrack DPM ? ?

## 2021-07-31 ENCOUNTER — Telehealth: Payer: Self-pay | Admitting: Podiatry

## 2021-07-31 NOTE — Telephone Encounter (Signed)
Called patient left voicemail advising 4/3 appt has been changed from 4:00pm to 4:45pm  ?

## 2021-07-31 NOTE — Telephone Encounter (Signed)
Called insurance([employer]), no prior authorization is required, call reference # is : [id number]: Calvin Schultz. ?Have contacted [facility] ([facility]),of this and to schedule appointment for patient. ?

## 2021-08-01 ENCOUNTER — Telehealth: Payer: Self-pay | Admitting: *Deleted

## 2021-08-01 NOTE — Telephone Encounter (Signed)
-----   Message from Trula Slade, DPM sent at 07/16/2021  6:06 AM EDT ----- ?I had ordered a ABI previously and he states nobody called. Can you call to see if [facility] can schedule? Thanks.  ? ?

## 2021-08-01 NOTE — Telephone Encounter (Signed)
Patient has been pre approved, contacted [facility] to schedule. ?

## 2021-08-05 ENCOUNTER — Ambulatory Visit (INDEPENDENT_AMBULATORY_CARE_PROVIDER_SITE_OTHER): Payer: No Typology Code available for payment source | Admitting: Podiatry

## 2021-08-05 DIAGNOSIS — L97511 Non-pressure chronic ulcer of other part of right foot limited to breakdown of skin: Secondary | ICD-10-CM

## 2021-08-07 ENCOUNTER — Other Ambulatory Visit: Payer: No Typology Code available for payment source

## 2021-08-12 ENCOUNTER — Ambulatory Visit
Admission: RE | Admit: 2021-08-12 | Discharge: 2021-08-12 | Disposition: A | Discharge status: Home / Self Care | Payer: No Typology Code available for payment source | Source: Ambulatory Visit | Attending: Podiatry | Admitting: Podiatry

## 2021-08-12 DIAGNOSIS — E11621 Type 2 diabetes mellitus with foot ulcer: Secondary | ICD-10-CM

## 2021-08-12 NOTE — Progress Notes (Signed)
Subjective: ?71 year old male presents the office today for follow-up evaluation of wound on the plantar aspect right foot.  He feels that the wound is doing better.  Denies any drainage or pus.  Get some occasional swelling or redness still.  He has no pain.  No fevers or chills that he reports today. ? ?Last A1c was 9.4 on July 19, 2021 ? ?Objective: ?AAO x3, NAD ?DP/PT pulses palpable bilaterally, CRT less than 3 seconds ?Sensation decreased. ?On the plantar aspect right foot submetatarsal 1 is a hyperkeratotic tissue with dried blood without any visible wound however upon debridement there is still central pinpoint opening present but there is no edema, erythema or signs of infection.  No probing, amount or tunneling.  No fluctuation or crepitation.  There is no malodor.   ?No pain with calf compression, swelling, warmth, erythema ? ? ? ? ? ? ?Assessment: ?Ulceration right foot submetatarsal 1 ? ?Plan: ?-All treatment options discussed with the patient including all alternatives, risks, complications.  ?-Debrided the hyperkeratotic lesion today with a #312 with scalpel and then to healthy, granular tissue in order to help promote wound healing.  He tolerated the procedure well and there was no bleeding.  I cleansed the wound with saline.  Recommended a small amount of antibiotic ointment dressing changes daily.  Continue offloading. ?-Awaiting MRI that was ordered given longstanding nature of the wound  ?-Patient encouraged to call the office with any questions, concerns, change in symptoms.  ? ?Vivi Barrack DPM ? ?

## 2021-08-15 ENCOUNTER — Ambulatory Visit (INDEPENDENT_AMBULATORY_CARE_PROVIDER_SITE_OTHER): Payer: No Typology Code available for payment source | Admitting: Podiatry

## 2021-08-15 ENCOUNTER — Telehealth: Payer: Self-pay | Admitting: Podiatry

## 2021-08-15 DIAGNOSIS — M205X1 Other deformities of toe(s) (acquired), right foot: Secondary | ICD-10-CM

## 2021-08-15 DIAGNOSIS — L97511 Non-pressure chronic ulcer of other part of right foot limited to breakdown of skin: Secondary | ICD-10-CM

## 2021-08-15 NOTE — Telephone Encounter (Signed)
Damita Dunnings from [facility] has been trying to reach patient to get him scheduled for test and not getting a response back.  When he is in office today please call Damita Dunnings so that they can get him schedule for the testing you ordered.  [phone]

## 2021-08-17 LAB — COLOGUARD: COLOGUARD: POSITIVE — AB

## 2021-08-17 LAB — EXTERNAL GENERIC LAB PROCEDURE: COLOGUARD: POSITIVE — AB

## 2021-08-18 NOTE — Progress Notes (Signed)
Subjective: ?71 year old male presents the office today for follow-up evaluation of wound on the plantar aspect right foot.  She feels the wound is continuing to improve.  Has not seen any swelling or redness or any drainage.  He did have the MRI performed.  Arterial studies still pending. ? ?Last A1c was 9.4 on July 19, 2021 ? ?Objective: ?AAO x3, NAD ?DP/PT pulses palpable bilaterally, CRT less than 3 seconds ?Sensation decreased. ?On the plantar aspect right foot submetatarsal 1 is a hyperkeratotic tissue with dried blood without any visible wound.  After debridement is able to debride the callus, dried blood and the underlying wound appears to be healed.  There is no edema, erythema or signs of infection.  Today there is a new spot present on the dorsal lateral aspect the right fifth toe.  Hyperkeratotic tissue with dried blood.  Upon debridement the area is preulcerative.  There is no edema, erythema or signs of infection.  Adductovarus is present of the toe. ?No pain with calf compression, swelling, warmth, erythema ? ? ? ? ? ? ? ?Assessment: ?Ulceration right foot submetatarsal 1-healed; preulcerative lesion right fifth toe ? ?Plan: ?-All treatment options discussed with the patient including all alternatives, risks, complications.  ?-MRI was reviewed with the patient and his wife who is present today.  No signs of osteomyelitis.  Arthritis is noted. ?-Debrided the hyperkeratotic lesion today with a #312 scalpel and appears underlying ulcer is healed.  There is no skin breakdown to the area today.  There is a new area that I debrided on the right fifth toe this is hyperkeratotic tissue.  The area is preulcerative.  Continue offloading at all times. ?-Daily foot inspection, glucose control. ?-Monitor for any clinical signs or symptoms of infection and directed to call the office immediately should any occur or go to the ER. ? ? ? ?

## 2021-08-21 ENCOUNTER — Ambulatory Visit: Payer: No Typology Code available for payment source

## 2021-08-21 DIAGNOSIS — E11621 Type 2 diabetes mellitus with foot ulcer: Secondary | ICD-10-CM

## 2021-08-21 DIAGNOSIS — L97512 Non-pressure chronic ulcer of other part of right foot with fat layer exposed: Secondary | ICD-10-CM

## 2021-08-21 NOTE — Progress Notes (Signed)
SITUATION ?Patient Name:  Calvin Schultz ?MRN:   [id number] ?Reason for Visit: Evaluation for custom foot orthotics ? ?Patient Report: ?Chief Complaint: Patient would like to obtain pre-certification from their insurance regarding coverage and an out of pocket estimate before proceeding. ? ?OBJECTIVE DATA ?Patient History / Diagnosis:   ?  ICD-10-CM   ?1. Diabetic ulcer of right midfoot associated with type 2 diabetes mellitus, with fat layer exposed (HCC)  E11.621   ? B71.696   ?  ?2. Ulcer of right foot with fat layer exposed (HCC)  L97.512   ?  ? ? ?Physician Recommended Device(s): Custom foot orthotics ? ?Laterality HCPCS Code  ?bilateral L3020  ? ?ACTIONS PERFORMED ?Patient was seen for evaluation for foot orthotics. Financial responsibility was discussed, and patient and family requested a check of benefits and an estimation of their out of pocket cost for the equipment.  ? ?PLAN ?patient and family is to be contacted once insurance pre-certification and out of pocket cost estimate is obtained so that they make an informed decision regarding plan of care. In the event patient and family elects not to pursue orthotic treatment, referring physician is to be contacted in order to update plan of care as needed. All questions were answered and concerns addressed. ? ? ? ? ? ?

## 2021-08-30 ENCOUNTER — Encounter: Payer: Self-pay | Admitting: Cardiology

## 2021-08-30 ENCOUNTER — Ambulatory Visit: Payer: No Typology Code available for payment source | Admitting: Cardiology

## 2021-08-30 VITALS — BP 180/93 | HR 66 | Temp 98.0°F | Resp 17 | Ht 69.0 in | Wt 225.0 lb

## 2021-08-30 DIAGNOSIS — I480 Paroxysmal atrial fibrillation: Secondary | ICD-10-CM

## 2021-08-30 DIAGNOSIS — Z7901 Long term (current) use of anticoagulants: Secondary | ICD-10-CM

## 2021-08-30 DIAGNOSIS — E1169 Type 2 diabetes mellitus with other specified complication: Secondary | ICD-10-CM

## 2021-08-30 DIAGNOSIS — I10 Essential (primary) hypertension: Secondary | ICD-10-CM

## 2021-08-30 DIAGNOSIS — R0683 Snoring: Secondary | ICD-10-CM

## 2021-08-30 DIAGNOSIS — I4510 Unspecified right bundle-branch block: Secondary | ICD-10-CM

## 2021-08-30 DIAGNOSIS — E1165 Type 2 diabetes mellitus with hyperglycemia: Secondary | ICD-10-CM

## 2021-08-30 MED ORDER — METOPROLOL SUCCINATE ER 25 MG PO TB24: 25.0000 mg | ORAL_TABLET | Freq: Every morning | ORAL | 0 refills | Status: DC

## 2021-08-30 NOTE — Progress Notes (Signed)
? ?ID:  Calvin Schultz, DOB October 17, 1950, MRN [id number] ? ?PCP:  Nickola Major, MD  ?Cardiologist:  Rex Kras, DO, [facility] (established care August 30, 2021) ? ?REASON FOR CONSULT: Atrial fibrillation ? ?REQUESTING PHYSICIAN:  ?Nickola Major, MD ?[address] [place],  [place] [address] ? ?Chief Complaint  ?Patient presents with  ? Atrial Fibrillation  ? New Patient (Initial Visit)  ? ? ?HPI  ?Calvin Schultz is a 71 y.o. Caucasian male whose past medical history and cardiovascular risk factors include: Insulin-dependent diabetes mellitus type 2 complicated by diabetic foot ulcer, hypertension, hyperlipidemia, stage III CKD, paroxysmal atrial fibrillation, right bundle branch block, advanced age ? ?He is referred to the office at the request of Nickola Major, MD for evaluation of atrial fibrillation. ? ?Atrial fibrillation: ?Patient recently went for a follow-up visit to PCPs office and was noted to be in atrial fibrillation.  He was started on Eliquis with normal wall prophylaxis and referred to cardiology for further evaluation and management.  Patient states that he is asymptomatic and does not appreciate when he goes in and out of atrial fibrillation.  This is a relatively new diagnosis as of March 2023.  No prior history of intracranial or gastrointestinal bleeding.  He does have a history of falls which have been precipitated by either increased pain medications (once while at dentist office) or mechanical.  ? ?Office blood pressures are not well controlled at home blood pressures usually range between 130-170 mmHg. ? ?Diabetes is also not well controlled with hemoglobin A1c's in the past being as high as 15 per patient and family.  Most recent hemoglobin A1c being 10.1. ? ?FUNCTIONAL STATUS: ?No structured exercise program or daily routine.  ? ?ALLERGIES: ?No Known Allergies ? ?MEDICATION LIST PRIOR TO VISIT: ?Current Meds  ?Medication Sig  ? ELIQUIS 5 MG TABS tablet Take 5 mg by mouth 2 (two) times  daily.  ? LEVEMIR FLEXTOUCH 100 UNIT/ML FlexTouch Pen Inject 20 Units into the skin at bedtime.  ? lisinopril (ZESTRIL) 10 MG tablet Take 20 mg by mouth in the morning.  ? metoprolol succinate (TOPROL XL) 25 MG 24 hr tablet Take 1 tablet (25 mg total) by mouth every morning.  ? rosuvastatin (CRESTOR) 40 MG tablet Take 40 mg by mouth daily.  ?  ? ?PAST MEDICAL HISTORY: ?Past Medical History:  ?Diagnosis Date  ? Arthritis   ? CKD (chronic kidney disease) stage 3, GFR 30-59 ml/min (HCC)   ? Diabetes mellitus without complication ([place])   ? Hyperlipidemia   ? Hypertension   ? ? ?PAST SURGICAL HISTORY: ?Past Surgical History:  ?Procedure Laterality Date  ? KIDNEY SURGERY    ? ? ?FAMILY HISTORY: ?The patient family history includes Dementia in his mother. ? ?SOCIAL HISTORY:  ?The patient  reports that he has never smoked. He has never used smokeless tobacco. He reports that he does not drink alcohol and does not use drugs. ? ?REVIEW OF SYSTEMS: ?Review of Systems  ?Cardiovascular:  Negative for chest pain, claudication, dyspnea on exertion, leg swelling, near-syncope, orthopnea, palpitations, paroxysmal nocturnal dyspnea and syncope.  ?Respiratory:  Positive for sleep disturbances due to breathing and snoring. Negative for shortness of breath.   ? ?PHYSICAL EXAM: ? ?  08/30/2021  ? 10:38 AM 10/26/2017  ?  6:27 PM  ?Vitals with BMI  ?Height '5\' 9"'    ?Weight 225 lbs   ?BMI 33.21   ?Systolic 102 725  ?Diastolic 93 92  ?Pulse 66 77  ? ? ?  CONSTITUTIONAL: Age-appropriate, obese, well-developed and well-nourished. No acute distress.  ?SKIN: Skin is warm and dry. No rash noted. No cyanosis. No pallor. No jaundice ?HEAD: Normocephalic and atraumatic.  ?EYES: No scleral icterus ?MOUTH/THROAT: Moist oral membranes.  ?NECK: No JVD present. No thyromegaly noted. No carotid bruits  ?CHEST Normal respiratory effort. No intercostal retractions  ?LUNGS: Clear to auscultation bilaterally.  No stridor. No wheezes. No rales.  ?CARDIOVASCULAR:  Regular rate and rhythm, positive S1-S2, no murmurs rubs or gallops appreciated. ?ABDOMINAL: Obese, soft, nontender, nondistended, positive bowel sounds in all 4 quadrants, no apparent ascites.  ?EXTREMITIES: +1 lower extremity pitting edema bilateral, warm to touch, right lower extremity in a boot.  ?HEMATOLOGIC: No significant bruising ?NEUROLOGIC: Oriented to person, place, and time. Nonfocal. Normal muscle tone.  ?PSYCHIATRIC: Normal mood and affect. Normal behavior. Cooperative ? ?CARDIAC DATABASE: ?EKG: ?July 19, 2021 (provided by referring physician): Atrial fibrillation, 66 bpm, left anterior fascicular block, right bundle branch block. ? ?August 30, 2021: Probable normal sinus rhythm, 64 bpm, left axis, left anterior fascicular block, right bundle branch block.   ? ?Echocardiogram: ?No results found for this or any previous visit from the past 1095 days. ?  ?Stress Testing: ?No results found for this or any previous visit from the past 1095 days. ? ?Heart Catheterization: ?None ? ?LABORATORY DATA: ?External Labs: ?Collected: July 19, 2021.  Available in Care Everywhere ?Hemoglobin A1c 10.1 ?WBC 16.5, hemoglobin 12.6, hematocrit 38.8%, platelets 325. ?Sodium 141, potassium 3.1, chloride 107, bicarb 26, BUN 20, creatinine 1.73. ?AST 15, ALT 17, alkaline phosphatase 113. ?eGFR 42 ?Total cholesterol 97, triglycerides 138, HDL 41, LDL direct 32, non-HDL 56 ? ? ?IMPRESSION: ? ?  ICD-10-CM   ?1. Paroxysmal atrial fibrillation (HCC)  I48.0 EKG 12-Lead  ?  metoprolol succinate (TOPROL XL) 25 MG 24 hr tablet  ?  PCV ECHOCARDIOGRAM COMPLETE  ?  PCV MYOCARDIAL PERFUSION WITH LEXISCAN  ?  Ambulatory referral to Sleep Studies  ?  ?2. Long term (current) use of anticoagulants  Z79.01   ?  ?3. Type 2 diabetes mellitus with hyperglycemia, with long-term current use of insulin (HCC)  E11.65   ? Z79.4   ?  ?4. Type 2 diabetes mellitus with hyperlipidemia (HCC)  E11.69   ? E78.5   ?  ?5. Benign hypertension  I10   ?  ?6. RBBB   I45.10   ?  ?7. Snoring  R06.83 Ambulatory referral to Sleep Studies  ?  ?  ? ?RECOMMENDATIONS: ?Calvin Schultz is a 71 y.o. Caucasian male whose past medical history and cardiac risk factors include: Insulin-dependent diabetes mellitus type 2 complicated by diabetic foot ulcer, hypertension, hyperlipidemia, stage III CKD, paroxysmal atrial fibrillation, right bundle branch block, advanced age. ? ?Paroxysmal atrial fibrillation ([place]) ?Rate control: Start Toprol-XL 25 mg p.o. every afternoon. ?Rhythm control: N/A. ?Thromboembolic prophylaxis: Eliquis ?CHA2DS2-VASc SCORE is 3 which correlates to 3.2% risk of stroke per year. ?Discussed the pathophysiology, management of rate and rhythm control, and thromboembolic prophylaxis with regards to atrial fibrillation. ?Echo will be ordered to evaluate for structural heart disease and left ventricular systolic function. ?Plan nuclear stress test -would like to rule out ischemic substrate given his comorbid conditions along with new onset of A-fib.  Unable to exercise due to diabetic wound in the right lower extremity with the current boot in place.  ? ?Long term (current) use of anticoagulants ?Indication: Paroxysmal atrial fibrillation. ?Risks, benefits, alternatives to oral anticoagulation discussed. ?Patient is educated on importance of fall prevention. ?  May consider left atrial appendage closure device in the future if he experiences frequent falls or troubles with bleeding.   ? ?Type 2 diabetes mellitus with hyperglycemia, with long-term current use of insulin ([place]) ?Plans to follow-up with endocrinology in the near future. ?Consider addition of Farxiga/Jardiance if clinically warranted given his comorbid conditions and A1c ? ?Type 2 diabetes mellitus with hyperlipidemia ([place]) ?Currently on rosuvastatin 40 mg p.o. nightly. ?Outside labs independently reviewed and noted above. Direct LDL 39 mg/dL. ?Continue current medical therapy. ? ?Benign hypertension ?Office blood  pressures are not well controlled. ?Based on my discussion with the patient home blood pressures are also not well controlled. ?I have asked him to keep a log of his blood pressures and to review it with eith

## 2021-09-06 ENCOUNTER — Other Ambulatory Visit: Payer: No Typology Code available for payment source

## 2021-09-09 ENCOUNTER — Other Ambulatory Visit: Payer: No Typology Code available for payment source

## 2021-09-13 ENCOUNTER — Ambulatory Visit (INDEPENDENT_AMBULATORY_CARE_PROVIDER_SITE_OTHER): Payer: No Typology Code available for payment source

## 2021-09-13 ENCOUNTER — Other Ambulatory Visit: Payer: No Typology Code available for payment source

## 2021-09-13 ENCOUNTER — Ambulatory Visit (INDEPENDENT_AMBULATORY_CARE_PROVIDER_SITE_OTHER): Payer: No Typology Code available for payment source | Admitting: Podiatry

## 2021-09-13 DIAGNOSIS — E11621 Type 2 diabetes mellitus with foot ulcer: Secondary | ICD-10-CM

## 2021-09-13 DIAGNOSIS — E1149 Type 2 diabetes mellitus with other diabetic neurological complication: Secondary | ICD-10-CM | POA: Diagnosis not present

## 2021-09-13 DIAGNOSIS — M205X1 Other deformities of toe(s) (acquired), right foot: Secondary | ICD-10-CM

## 2021-09-13 DIAGNOSIS — L97412 Non-pressure chronic ulcer of right heel and midfoot with fat layer exposed: Secondary | ICD-10-CM | POA: Diagnosis not present

## 2021-09-13 NOTE — Progress Notes (Signed)
Subjective: ?71 year old male presents the office today for follow-up evaluation of wound on the plantar aspect right foot.  States that the wound is doing better.  No swelling redness or any drainage that he reports.  No fevers or chills. ? ?Last A1c was 9.4 on July 19, 2021 ? ?Objective: ?AAO x3, NAD ?DP/PT pulses palpable bilaterally, CRT less than 3 seconds ?Sensation decreased. ?Plantar aspect of right foot submetatarsal 1 is a hyperkeratotic lesion.  Upon debridement there is no underlying ulceration drainage or signs of infection.  There is new, healthy skin present and the wound appears to be healed.  No new ulcerations are noted. ?No pain with calf compression, swelling, warmth, erythema ? ? ? ? ? ? ?Assessment: ?Healed Ulceration right foot submetatarsal 1-healed ? ?Plan: ?-All treatment options discussed with the patient including all alternatives, risks, complications.  ?-Debrided the hyperkeratotic lesion without any complications or bleeding.  The wound appears to be healed.  Continue moisturizer, offloading.  I do think benefit from diabetic shoes to help offload and prevent further ulcerations. ? ?Return in about 4 weeks (around 10/11/2021). ? ?Vivi Barrack DPM ?

## 2021-09-13 NOTE — Progress Notes (Signed)
SITUATION ?Reason for Consult: Evaluation for Bilateral Custom Foot Orthoses ?Patient / Caregiver Report: Patient is ready for foot orthotics ? ?OBJECTIVE DATA: ?Patient History / Diagnosis:  ?  ICD-10-CM   ?1. Acquired adductovarus rotation of toe, right  M20.5X1   ?  ?2. Diabetic ulcer of right midfoot associated with type 2 diabetes mellitus, with fat layer exposed (HCC)  E11.621   ? L97.673   ?  ? ? ?Current or Previous Devices:   None and no history ? ?Foot Examination: ?Skin presentation:   Compromised ?Ulcers & Callousing:   Ulcer of right midfoot ?Toe / Foot Deformities:  Pes planus ?Weight Bearing Presentation:  Planus ?Sensation:    Compromised ? ?Shoe Size:    11XW ? ?ORTHOTIC RECOMMENDATION ?Recommended Device: 1x pair of custom functional foot orthotics ? ?GOALS OF ORTHOSES ?- Reduce Pain ?- Prevent Foot Deformity ?- Prevent Progression of Further Foot Deformity ?- Relieve Pressure ?- Improve the Overall Biomechanical Function of the Foot and Lower Extremity. ? ?ACTIONS PERFORMED ?Potential out of pocket cost was communicated to patient. Patient understood and consent to casting. Patient was casted for Foot Orthoses via crush box. Procedure was explained and patient tolerated procedure well. Casts were shipped to central fabrication. All questions were answered and concerns addressed. ? ?PLAN ?Patient is to be called for fitting when devices are ready.  ? ? ?

## 2021-10-08 ENCOUNTER — Ambulatory Visit: Payer: No Typology Code available for payment source

## 2021-10-08 ENCOUNTER — Ambulatory Visit (INDEPENDENT_AMBULATORY_CARE_PROVIDER_SITE_OTHER): Payer: No Typology Code available for payment source | Admitting: Podiatry

## 2021-10-08 DIAGNOSIS — L84 Corns and callosities: Secondary | ICD-10-CM

## 2021-10-08 DIAGNOSIS — E1149 Type 2 diabetes mellitus with other diabetic neurological complication: Secondary | ICD-10-CM

## 2021-10-08 DIAGNOSIS — E11621 Type 2 diabetes mellitus with foot ulcer: Secondary | ICD-10-CM

## 2021-10-08 NOTE — Progress Notes (Signed)
Subjective: 71 year old male presents the office today for follow-up evaluation of wound on the plantar aspect right foot.  He states he is continue to be doing well.  Denies any opening, swelling or redness or any drainage.  Denies any fevers or chills.  He has no other concerns.  Last A1c was 9.4 on July 19, 2021  Objective: AAO x3, NAD DP/PT pulses palpable bilaterally, CRT less than 3 seconds Sensation decreased. Minimal hyperkeratotic tissue submetatarsal 1 right foot.  There is no definitive skin opening at this time and more dry skin.  Is been applying moisturizer to the area.  There is no edema, erythema, drainage or pus.  No fluctuance or crepitation. No pain with calf compression, swelling, warmth, erythema  Assessment: Healed Ulceration right foot submetatarsal 1-healed  Plan: -All treatment options discussed with the patient including all alternatives, risks, complications.  -Minimal hyperkeratotic tissue which is debrided today without any complications or bleeding.  Continue moisturizer, offloading.  He is following up today with Arlys John for inserts to help with offloading.  Discussed with him that as he gets the inserts daily foot inspection and if he needs any adjustments to let us now. -Glucose control  RTC 3 months for diabetic foot exam or sooner if needed.  Vivi Barrack DPM

## 2021-10-08 NOTE — Progress Notes (Signed)
SITUATION: Reason for Visit: Fitting and Delivery of Custom Fabricated Foot Orthoses Patient Report: Patient reports comfort and is satisfied with device.  OBJECTIVE DATA: Patient History / Diagnosis:     ICD-10-CM   1. Type II diabetes mellitus with neurological manifestations (HCC)  E11.49     2. Diabetic ulcer of right midfoot associated with type 2 diabetes mellitus, with fat layer exposed (HCC)  E11.621    L97.412       Provided Device:  Custom Functional Foot Orthotics     [employer]: [id number]  GOAL OF ORTHOSIS - Improve gait - Decrease energy expenditure - Improve Balance - Provide Triplanar stability of foot complex - Facilitate motion  ACTIONS PERFORMED Patient was fit with foot orthotics trimmed to shoe last. Patient tolerated fittign procedure.   Patient was provided with verbal and written instruction and demonstration regarding donning, doffing, wear, care, proper fit, function, purpose, cleaning, and use of the orthosis and in all related precautions and risks and benefits regarding the orthosis.  Patient was also provided with verbal instruction regarding how to report any failures or malfunctions of the orthosis and necessary follow up care. Patient was also instructed to contact our office regarding any change in status that may affect the function of the orthosis.  Patient demonstrated independence with proper donning, doffing, and fit and verbalized understanding of all instructions.  PLAN: Patient is to follow up in one week or as necessary (PRN). All questions were answered and concerns addressed. Plan of care was discussed with and agreed upon by the patient.

## 2021-10-08 NOTE — Patient Instructions (Signed)
Diabetes Mellitus and Foot Care Foot care is an important part of your health, especially when you have diabetes. Diabetes may cause you to have problems because of poor blood flow (circulation) to your feet and legs, which can cause your skin to: Become thinner and drier. Break more easily. Heal more slowly. Peel and crack. You may also have nerve damage (neuropathy) in your legs and feet, causing decreased feeling in them. This means that you may not notice minor injuries to your feet that could lead to more serious problems. Noticing and addressing any potential problems early is the best way to prevent future foot problems. How to care for your feet Foot hygiene  Wash your feet daily with warm water and mild soap. Do not use hot water. Then, pat your feet and the areas between your toes until they are completely dry. Do not soak your feet as this can dry your skin. Trim your toenails straight across. Do not dig under them or around the cuticle. File the edges of your nails with an emery board or nail file. Apply a moisturizing lotion or petroleum jelly to the skin on your feet and to dry, brittle toenails. Use lotion that does not contain alcohol and is unscented. Do not apply lotion between your toes. Shoes and socks Wear clean socks or stockings every day. Make sure they are not too tight. Do not wear knee-high stockings since they may decrease blood flow to your legs. Wear shoes that fit properly and have enough cushioning. Always look in your shoes before you put them on to be sure there are no objects inside. To break in new shoes, wear them for just a few hours a day. This prevents injuries on your feet. Wounds, scrapes, corns, and calluses  Check your feet daily for blisters, cuts, bruises, sores, and redness. If you cannot see the bottom of your feet, use a mirror or ask someone for help. Do not cut corns or calluses or try to remove them with medicine. If you find a minor scrape,  cut, or break in the skin on your feet, keep it and the skin around it clean and dry. You may clean these areas with mild soap and water. Do not clean the area with peroxide, alcohol, or iodine. If you have a wound, scrape, corn, or callus on your foot, look at it several times a day to make sure it is healing and not infected. Check for: Redness, swelling, or pain. Fluid or blood. Warmth. Pus or a bad smell. General tips Do not cross your legs. This may decrease blood flow to your feet. Do not use heating pads or hot water bottles on your feet. They may burn your skin. If you have lost feeling in your feet or legs, you may not know this is happening until it is too late. Protect your feet from hot and cold by wearing shoes, such as at the beach or on hot pavement. Schedule a complete foot exam at least once a year (annually) or more often if you have foot problems. Report any cuts, sores, or bruises to your health care provider immediately. Where to find more information American Diabetes Association: www.diabetes.org Association of Diabetes Care & Education Specialists: www.diabeteseducator.org Contact a health care provider if: You have a medical condition that increases your risk of infection and you have any cuts, sores, or bruises on your feet. You have an injury that is not healing. You have redness on your legs or feet. You   feel burning or tingling in your legs or feet. You have pain or cramps in your legs and feet. Your legs or feet are numb. Your feet always feel cold. You have pain around any toenails. Get help right away if: You have a wound, scrape, corn, or callus on your foot and: You have pain, swelling, or redness that gets worse. You have fluid or blood coming from the wound, scrape, corn, or callus. Your wound, scrape, corn, or callus feels warm to the touch. You have pus or a bad smell coming from the wound, scrape, corn, or callus. You have a fever. You have a red  line going up your leg. Summary Check your feet every day for blisters, cuts, bruises, sores, and redness. Apply a moisturizing lotion or petroleum jelly to the skin on your feet and to dry, brittle toenails. Wear shoes that fit properly and have enough cushioning. If you have foot problems, report any cuts, sores, or bruises to your health care provider immediately. Schedule a complete foot exam at least once a year (annually) or more often if you have foot problems. This information is not intended to replace advice given to you by your health care provider. Make sure you discuss any questions you have with your health care provider. Document Revised: 11/10/2019 Document Reviewed: 11/10/2019 Elsevier Patient Education  2023 [employer].  

## 2021-10-11 ENCOUNTER — Ambulatory Visit: Payer: No Typology Code available for payment source | Admitting: Cardiology

## 2021-10-11 ENCOUNTER — Ambulatory Visit: Payer: No Typology Code available for payment source

## 2021-10-11 DIAGNOSIS — I480 Paroxysmal atrial fibrillation: Secondary | ICD-10-CM

## 2021-10-14 ENCOUNTER — Other Ambulatory Visit: Payer: No Typology Code available for payment source

## 2021-10-21 ENCOUNTER — Ambulatory Visit: Payer: No Typology Code available for payment source | Admitting: Cardiology

## 2021-10-25 ENCOUNTER — Other Ambulatory Visit: Payer: No Typology Code available for payment source

## 2021-10-28 ENCOUNTER — Institutional Professional Consult (permissible substitution): Payer: No Typology Code available for payment source | Admitting: Neurology

## 2021-10-30 ENCOUNTER — Other Ambulatory Visit: Payer: No Typology Code available for payment source

## 2021-11-06 ENCOUNTER — Ambulatory Visit: Payer: No Typology Code available for payment source | Admitting: Cardiology

## 2021-11-13 ENCOUNTER — Other Ambulatory Visit: Payer: No Typology Code available for payment source

## 2021-11-19 ENCOUNTER — Ambulatory Visit: Payer: No Typology Code available for payment source | Admitting: Cardiology

## 2021-11-28 ENCOUNTER — Other Ambulatory Visit: Payer: Self-pay | Admitting: Cardiology

## 2021-11-28 DIAGNOSIS — I480 Paroxysmal atrial fibrillation: Secondary | ICD-10-CM

## 2022-01-07 ENCOUNTER — Ambulatory Visit (INDEPENDENT_AMBULATORY_CARE_PROVIDER_SITE_OTHER): Payer: No Typology Code available for payment source | Admitting: Podiatry

## 2022-01-07 DIAGNOSIS — L84 Corns and callosities: Secondary | ICD-10-CM | POA: Diagnosis not present

## 2022-01-07 DIAGNOSIS — E1149 Type 2 diabetes mellitus with other diabetic neurological complication: Secondary | ICD-10-CM

## 2022-01-07 NOTE — Progress Notes (Signed)
Subjective: Chief Complaint  Patient presents with   Diabetes    Rm 11 Diabetic foot exam. Pt states he has not current issues. Noted that there is a callus on right ball of foot that pt has not complaints about. Pt states he does not need nail trim.    71 year old male presents with above concerns.  He states he is doing well.  Has not noticed any open lesions or any drainage.  No swelling or redness.  Last A1c 9.1 on 12/11/2021  Objective: AAO x3, NAD DP/PT pulses palpable bilaterally, CRT less than 3 seconds Sensation decreased with SWMF bilaterally On the right foot submetatarsal 1 is a hyperkeratotic lesion with dried blood.  See picture below which was present prior to debridement.  After debridement still some mild dried blood but there is no definitive skin opening there is no swelling or redness or any drainage.  No fluctuation or crepitation.  There is no malodor. No pain with calf compression, swelling, warmth, erythema     Assessment: Preulcerative calluses right foot, uncontrolled diabetes with neuropathy  Plan: -All treatment options discussed with the patient including all alternatives, risks, complications.  -Sharply debrided lesion to the any complications or bleeding.  Recommend moisturizer and offloading at all times. -Discussed daily foot inspection, glucose control. -Patient encouraged to call the office with any questions, concerns, change in symptoms.   Return in about 3 months (around 04/08/2022).  Vivi Barrack DPM

## 2022-02-26 ENCOUNTER — Other Ambulatory Visit: Payer: Self-pay | Admitting: Cardiology

## 2022-02-26 DIAGNOSIS — I480 Paroxysmal atrial fibrillation: Secondary | ICD-10-CM

## 2022-04-08 ENCOUNTER — Ambulatory Visit: Payer: No Typology Code available for payment source | Admitting: Podiatry

## 2022-05-25 ENCOUNTER — Other Ambulatory Visit: Payer: Self-pay | Admitting: Cardiology

## 2022-05-25 DIAGNOSIS — I480 Paroxysmal atrial fibrillation: Secondary | ICD-10-CM

## 2022-08-22 ENCOUNTER — Other Ambulatory Visit: Payer: Self-pay | Admitting: Cardiology

## 2022-08-22 DIAGNOSIS — I480 Paroxysmal atrial fibrillation: Secondary | ICD-10-CM

## 2023-05-30 ENCOUNTER — Inpatient Hospital Stay (HOSPITAL_COMMUNITY)
Admission: EM | Admit: 2023-05-30 | Discharge: 2023-06-05 | DRG: 683 | Disposition: A | Discharge status: Home / Self Care | Payer: No Typology Code available for payment source | Attending: Internal Medicine | Admitting: Internal Medicine

## 2023-05-30 ENCOUNTER — Observation Stay (HOSPITAL_COMMUNITY): Payer: Medicare Other

## 2023-05-30 ENCOUNTER — Other Ambulatory Visit: Payer: Self-pay

## 2023-05-30 ENCOUNTER — Encounter (HOSPITAL_COMMUNITY): Payer: Self-pay | Admitting: Internal Medicine

## 2023-05-30 DIAGNOSIS — Z7901 Long term (current) use of anticoagulants: Secondary | ICD-10-CM

## 2023-05-30 DIAGNOSIS — N185 Chronic kidney disease, stage 5: Secondary | ICD-10-CM | POA: Diagnosis present

## 2023-05-30 DIAGNOSIS — D509 Iron deficiency anemia, unspecified: Secondary | ICD-10-CM | POA: Diagnosis present

## 2023-05-30 DIAGNOSIS — E872 Acidosis, unspecified: Secondary | ICD-10-CM | POA: Diagnosis present

## 2023-05-30 DIAGNOSIS — D649 Anemia, unspecified: Secondary | ICD-10-CM | POA: Diagnosis present

## 2023-05-30 DIAGNOSIS — N189 Chronic kidney disease, unspecified: Secondary | ICD-10-CM | POA: Diagnosis present

## 2023-05-30 DIAGNOSIS — N136 Pyonephrosis: Secondary | ICD-10-CM | POA: Diagnosis present

## 2023-05-30 DIAGNOSIS — I083 Combined rheumatic disorders of mitral, aortic and tricuspid valves: Secondary | ICD-10-CM | POA: Diagnosis present

## 2023-05-30 DIAGNOSIS — N179 Acute kidney failure, unspecified: Secondary | ICD-10-CM | POA: Diagnosis not present

## 2023-05-30 DIAGNOSIS — R809 Proteinuria, unspecified: Secondary | ICD-10-CM | POA: Diagnosis present

## 2023-05-30 DIAGNOSIS — H9319 Tinnitus, unspecified ear: Secondary | ICD-10-CM | POA: Diagnosis present

## 2023-05-30 DIAGNOSIS — D631 Anemia in chronic kidney disease: Secondary | ICD-10-CM | POA: Diagnosis present

## 2023-05-30 DIAGNOSIS — E8770 Fluid overload, unspecified: Secondary | ICD-10-CM | POA: Diagnosis present

## 2023-05-30 DIAGNOSIS — Z794 Long term (current) use of insulin: Secondary | ICD-10-CM

## 2023-05-30 DIAGNOSIS — E785 Hyperlipidemia, unspecified: Secondary | ICD-10-CM | POA: Diagnosis present

## 2023-05-30 DIAGNOSIS — E1122 Type 2 diabetes mellitus with diabetic chronic kidney disease: Secondary | ICD-10-CM | POA: Diagnosis present

## 2023-05-30 DIAGNOSIS — I120 Hypertensive chronic kidney disease with stage 5 chronic kidney disease or end stage renal disease: Secondary | ICD-10-CM | POA: Diagnosis present

## 2023-05-30 DIAGNOSIS — R001 Bradycardia, unspecified: Secondary | ICD-10-CM | POA: Diagnosis present

## 2023-05-30 DIAGNOSIS — E876 Hypokalemia: Secondary | ICD-10-CM | POA: Diagnosis present

## 2023-05-30 DIAGNOSIS — R944 Abnormal results of kidney function studies: Secondary | ICD-10-CM | POA: Diagnosis not present

## 2023-05-30 DIAGNOSIS — R8290 Unspecified abnormal findings in urine: Secondary | ICD-10-CM | POA: Diagnosis present

## 2023-05-30 DIAGNOSIS — M1990 Unspecified osteoarthritis, unspecified site: Secondary | ICD-10-CM | POA: Diagnosis present

## 2023-05-30 DIAGNOSIS — Z87442 Personal history of urinary calculi: Secondary | ICD-10-CM

## 2023-05-30 DIAGNOSIS — I482 Chronic atrial fibrillation, unspecified: Secondary | ICD-10-CM | POA: Diagnosis present

## 2023-05-30 DIAGNOSIS — R310 Gross hematuria: Secondary | ICD-10-CM | POA: Diagnosis not present

## 2023-05-30 DIAGNOSIS — R600 Localized edema: Secondary | ICD-10-CM | POA: Diagnosis present

## 2023-05-30 DIAGNOSIS — R972 Elevated prostate specific antigen [PSA]: Secondary | ICD-10-CM | POA: Diagnosis present

## 2023-05-30 DIAGNOSIS — Z79899 Other long term (current) drug therapy: Secondary | ICD-10-CM

## 2023-05-30 DIAGNOSIS — M170 Bilateral primary osteoarthritis of knee: Secondary | ICD-10-CM | POA: Diagnosis present

## 2023-05-30 DIAGNOSIS — N200 Calculus of kidney: Secondary | ICD-10-CM

## 2023-05-30 LAB — COMPREHENSIVE METABOLIC PANEL
ALT: 16 U/L (ref 0–44)
AST: 14 U/L — ABNORMAL LOW (ref 15–41)
Albumin: 2.9 g/dL — ABNORMAL LOW (ref 3.5–5.0)
Alkaline Phosphatase: 101 U/L (ref 38–126)
Anion gap: 14 (ref 5–15)
BUN: 55 mg/dL — ABNORMAL HIGH (ref 8–23)
CO2: 15 mmol/L — ABNORMAL LOW (ref 22–32)
Calcium: 8.8 mg/dL — ABNORMAL LOW (ref 8.9–10.3)
Chloride: 111 mmol/L (ref 98–111)
Creatinine, Ser: 4.46 mg/dL — ABNORMAL HIGH (ref 0.61–1.24)
GFR, Estimated: 13 mL/min — ABNORMAL LOW (ref >60)
Glucose, Bld: 128 mg/dL — ABNORMAL HIGH (ref 70–99)
Potassium: 3.8 mmol/L (ref 3.5–5.1)
Sodium: 140 mmol/L (ref 135–145)
Total Bilirubin: 0.6 mg/dL (ref 0.0–1.2)
Total Protein: 6.1 g/dL — ABNORMAL LOW (ref 6.5–8.1)

## 2023-05-30 LAB — URINALYSIS, ROUTINE W REFLEX MICROSCOPIC
Bilirubin Urine: NEGATIVE
Glucose, UA: 150 mg/dL — AB
Ketones, ur: NEGATIVE mg/dL
Nitrite: NEGATIVE
Protein, ur: >=300 mg/dL — AB
Specific Gravity, Urine: 1.011 (ref 1.005–1.030)
WBC, UA: >50 WBC/hpf (ref 0–5)
pH: 6 (ref 5.0–8.0)

## 2023-05-30 LAB — MAGNESIUM: Magnesium: 2 mg/dL (ref 1.7–2.4)

## 2023-05-30 LAB — HIV ANTIBODY (ROUTINE TESTING W REFLEX): HIV Screen 4th Generation wRfx: NONREACTIVE

## 2023-05-30 LAB — CBC WITH DIFFERENTIAL/PLATELET
Abs Immature Granulocytes: 0.04 10*3/uL (ref 0.00–0.07)
Basophils Absolute: 0 10*3/uL (ref 0.0–0.1)
Basophils Relative: 0 %
Eosinophils Absolute: 0.2 10*3/uL (ref 0.0–0.5)
Eosinophils Relative: 2 %
HCT: 26.4 % — ABNORMAL LOW (ref 39.0–52.0)
Hemoglobin: 8 g/dL — ABNORMAL LOW (ref 13.0–17.0)
Immature Granulocytes: 0 %
Lymphocytes Relative: 6 %
Lymphs Abs: 0.7 10*3/uL (ref 0.7–4.0)
MCH: 27 pg (ref 26.0–34.0)
MCHC: 30.3 g/dL (ref 30.0–36.0)
MCV: 89.2 fL (ref 80.0–100.0)
Monocytes Absolute: 1.1 10*3/uL — ABNORMAL HIGH (ref 0.1–1.0)
Monocytes Relative: 9 %
Neutro Abs: 9.9 10*3/uL — ABNORMAL HIGH (ref 1.7–7.7)
Neutrophils Relative %: 83 %
Platelets: 221 10*3/uL (ref 150–400)
RBC: 2.96 MIL/uL — ABNORMAL LOW (ref 4.22–5.81)
RDW: 14.1 % (ref 11.5–15.5)
WBC: 12 10*3/uL — ABNORMAL HIGH (ref 4.0–10.5)
nRBC: 0 % (ref 0.0–0.2)

## 2023-05-30 LAB — PROTEIN / CREATININE RATIO, URINE
Creatinine, Urine: 20 mg/dL
Protein Creatinine Ratio: 10.15 mg/mg{creat} — ABNORMAL HIGH (ref 0.00–0.15)
Total Protein, Urine: 203 mg/dL

## 2023-05-30 LAB — CREATININE, URINE, RANDOM: Creatinine, Urine: 20 mg/dL

## 2023-05-30 LAB — SODIUM, URINE, RANDOM: Sodium, Ur: 114 mmol/L

## 2023-05-30 LAB — PHOSPHORUS: Phosphorus: 5.8 mg/dL — ABNORMAL HIGH (ref 2.5–4.6)

## 2023-05-30 MED ORDER — AMLODIPINE BESYLATE 5 MG PO TABS
5.0000 mg | ORAL_TABLET | Freq: Every day | ORAL | Status: DC
  Administered 2023-05-30 – 2023-06-05 (×7): 5 mg via ORAL
  Filled 2023-05-30 (×7): qty 1

## 2023-05-30 MED ORDER — HEPARIN SODIUM (PORCINE) 5000 UNIT/ML IJ SOLN
5000.0000 [IU] | Freq: Three times a day (TID) | INTRAMUSCULAR | Status: DC
  Administered 2023-05-30: 5000 [IU] via SUBCUTANEOUS
  Filled 2023-05-30: qty 1

## 2023-05-30 MED ORDER — APIXABAN 5 MG PO TABS
5.0000 mg | ORAL_TABLET | Freq: Two times a day (BID) | ORAL | Status: DC
  Administered 2023-05-30 – 2023-06-01 (×5): 5 mg via ORAL
  Filled 2023-05-30 (×4): qty 1
  Filled 2023-05-30: qty 2

## 2023-05-30 MED ORDER — ACETAMINOPHEN 325 MG PO TABS: 650.0000 mg | ORAL_TABLET | Freq: Four times a day (QID) | ORAL | Status: DC | PRN

## 2023-05-30 MED ORDER — FUROSEMIDE 10 MG/ML IJ SOLN
80.0000 mg | Freq: Three times a day (TID) | INTRAMUSCULAR | Status: DC
Start: 2023-05-30 — End: 2023-06-01
  Administered 2023-05-30 – 2023-06-01 (×7): 80 mg via INTRAVENOUS
  Filled 2023-05-30 (×7): qty 8

## 2023-05-30 MED ORDER — ROSUVASTATIN CALCIUM 20 MG PO TABS
40.0000 mg | ORAL_TABLET | Freq: Every day | ORAL | Status: DC
  Administered 2023-05-30 – 2023-06-05 (×7): 40 mg via ORAL
  Filled 2023-05-30 (×7): qty 2

## 2023-05-30 MED ORDER — SENNOSIDES-DOCUSATE SODIUM 8.6-50 MG PO TABS: 1.0000 | ORAL_TABLET | Freq: Every evening | ORAL | Status: DC | PRN

## 2023-05-30 MED ORDER — ACETAMINOPHEN 650 MG RE SUPP: 650.0000 mg | Freq: Four times a day (QID) | RECTAL | Status: DC | PRN

## 2023-05-30 NOTE — H&P (Cosign Needed Addendum)
Date: 05/31/2023               Patient Name:  Calvin Schultz MRN: [id number]  DOB: 1950-05-26 Age / Sex: 73 y.o., male   PCP: Gwenlyn Found, MD              Medical Service: Internal Medicine Teaching Service              Attending Physician: Dr. Mayford Knife, Dorene Ar, MD    First Contact: Filomena Jungling, MS 3 Pager: [phone]  Second Contact: Dr. Manuela Neptune, MD Pager: [phone]  Third Contact Dr. Gwenevere Abbot, MD Pager: [phone]       After Hours (After 5p/  First Contact Pager: [phone]  weekends / holidays): Second Contact Pager: [phone]   Chief Complaint: Elevated creatinine levels  History of Present Illness: Calvin Schultz is a 73 y.o. male with past medical hx significant for HTN, TII DM, HLD, and A Fib on Eliquis presenting from home for elevated creatinine levels found on pre-physical routine labs. Patient reports he has a PCP appointment schedule next week and had labs drawn yesterday. Reports his PCP contacted him this AM to go to the ED given his elevated creatinine levels. He denies dysuria, hematuria, polyuria or difficulty initiating or maintaining urinary stream. Endorses urinating 1-2 x per night and being able to feel the sensation to urinate. Denies new foods or drinks and endorses appetite and fluid intake ok. Endorses some lightheadedness when getting up but denies dizziness or lightheadedness on exam. Endorses current tinnitus that is not new and a vertigo episode about 8-9 months ago. Endorses hx of kidney stones requiring surgery x2 more than 10 years ago. Patient's wife reports he is a fall risk given his bilateral knee arthritis and uses a cane at baseline.  Patient denies starting new medications recently or having a recent infection. Endorses taking a diabetic supplement pack that includes vitamin C, Mg, K+ and iron. Reports he also takes a gummy supplement that contains ashwagandha and a supplement for his prostate.   While in the ED, nephrology was  called and recommended renal US.   Meds:  Olmesartan Medoxomil 20 mg daily Rosuvastatin 40 mg daily Eliquis 5 mg BID Insulin PRN for elevated glucose levels  Allergies: Allergies as of 05/30/2023   (No Known Allergies)   Past Medical History:  Diagnosis Date   Arthritis    CKD (chronic kidney disease) stage 3, GFR 30-59 ml/min (HCC)    Diabetes mellitus without complication (HCC)    Hyperlipidemia    Hypertension    Family History:  Family History  Problem Relation Age of Onset   Dementia Mother   Patient denies family hx of prostate cancer.  Social History: Denies tobacco or other drug use. Reports he stopped drinking alcohol about 4-5 years ago when he was diagnosed with TII DM. Reports living with his wife and daughter who recently had a leg amputation. Endorses walking with a cane for support with his bilateral knee arthritis.  Review of Systems: A complete ROS was negative except as per HPI.   Physical Exam: Blood pressure (!) 147/91, pulse 81, temperature 97.9 F (36.6 C), temperature source Oral, resp. rate 16, height 5\' 9"  (1.753 m), weight 108 kg, SpO2 100%. Physical Exam Constitutional:      General: He is not in acute distress.    Appearance: He is not ill-appearing or toxic-appearing.  Cardiovascular:     Rate and Rhythm: Normal rate and regular rhythm.  Pulmonary:  Effort: Pulmonary effort is normal.     Breath sounds: Normal breath sounds.  Abdominal:     General: Bowel sounds are normal.     Palpations: Abdomen is soft.  Musculoskeletal:     Right lower leg: Edema present.     Left lower leg: Edema present.  Skin:    General: Skin is warm and dry.  Neurological:     Mental Status: He is alert.  Psychiatric:        Mood and Affect: Mood normal.        Behavior: Behavior normal.       Latest Ref Rng & Units 05/31/2023    5:42 AM 05/30/2023    8:56 AM 06/25/2021    3:39 PM  CBC  WBC 4.0 - 10.5 K/uL 12.1  12.0  13.7   Hemoglobin 13.0 - 17.0  g/dL 8.5  8.0  54.0   Hematocrit 39.0 - 52.0 % 28.2  26.4  41.2   Platelets 150 - 400 K/uL 260  221  367       Latest Ref Rng & Units 05/31/2023    5:42 AM 05/30/2023    8:56 AM 06/25/2021    3:39 PM  CMP  Glucose 70 - 99 mg/dL 77  981  191   BUN 8 - 23 mg/dL 53  55  22   Creatinine 0.61 - 1.24 mg/dL 4.78  2.95  6.21   Sodium 135 - 145 mmol/L 140  140  139   Potassium 3.5 - 5.1 mmol/L 3.5  3.8  3.5   Chloride 98 - 111 mmol/L 111  111  102   CO2 22 - 32 mmol/L 16  15  22    Calcium 8.9 - 10.3 mg/dL 9.2  8.8  9.8   Total Protein 6.5 - 8.1 g/dL  6.1    Total Bilirubin 0.0 - 1.2 mg/dL  0.6    Alkaline Phos 38 - 126 U/L  101    AST 15 - 41 U/L  14    ALT 0 - 44 U/L  16     UA Hgb urine dipstick: Moderate Leukocytes: Large Protein: >= 300   Urinalysis Microscopy Bacteria: Few RBC/HPF: 11-20 WBC Clumps: Present WBC: >50  Renal US IMPRESSION: 1. Moderate left hydronephrosis. 2. Normal sonographic appearance of the right kidney.  Assessment & Plan by Problem: Principal Problem:   Acute kidney injury superimposed on chronic kidney disease (HCC) Active Problems:   Elevated PSA   Nephrolithiasis   Bilateral lower extremity edema   Chronic atrial fibrillation (HCC)  Calvin Schultz is a 73 y.o. male with past medical hx significant for HTN, TII DM, HLD, and A Fib on Eliquis presenting from home for elevated creatinine levels found on pre-physical routine labs and admitted to the [facility] for AKI superimposed on CKD.  AKI superimposed on CKD NAGMA Proteinuria Creatinine 4.46. Per chart review, patient appears to have declining kidney function with creatinine levels trending from ~1.38 in 2015 to ~2.48 in 2023. Patient is asymptomatic. Considered pre-renal; however, BUN/Creatinine ratio ~12.3 and patient's does not appear hypovolemic on exam. Infectious etiology considered as patient does have slight leukocytosis (12.0) in addition to large leukocytes, few bacteria and WBC>50 on UA.  Patient denies dysuria, polyuria or hematuria. Medication-induced etiology considered; however, patient does not endorse any new medications recently. Post-renal obstruction considered such as enlarged prostate given moderate L hydronephrosis seen on renal U/S; however, patient does not endorse difficulty initiating or maintaining stream. Suspect this is  an AKI superimposed on CKD. Will plan to consult nephrology, appreciate recommendations. - Plan to consult nephrology, appreciate recommendation  - HIV Antibody - Mg lab - Phosphorus lab - Fall precautions - Follow up urine culture  Elevated PSA Checked yesterday by PCP. Elevated at 17.86 with previous one being at 4.38 which is within normal range around 1.5 years ago. Will need urology follow up. Will discuss with our urology team.   Iron Deficiency Anemia Hematuria Hgb 8.0 and iron level 34, and ferritin 23. Will start iron supplementation. Given hematuria, will discuss with urology but will need outpatient follow up.   Chronic Conditions #HTN Patient hypertensive in the ED. Will hold home Olmesartan Medoxomil. May consider calcium channel blocker or hydralazine if needed.   #HLD Last lipid panel noted on 07/19/21 WNL. Continue home Rosuvastatin.  #TII DM Patient's glucose 128 on admission. Patient reports using insulin PRN as needed at home. Hx of uncontrolled diabetes.  - Start SSI while pt is here  #A Fib On Eliquis at home. Continue home Eliquis 5 mg BID.  Diet: NPO VTE: Eliquis Fluids: None Code: Full   Dispo: Admit patient to Observation with expected length of stay less than 2 midnights.  Signed: Miguel Aschoff, MD 05/31/2023, 7:09 PM  Pager: [phone]  Attestation for Student Documentation:  Mr.  Schultz admission was discussed with the admitting team.  I re-performed the history, physical exam and medical decision-making activities of this service and have verified that the service and findings are  accurately documented in the student's note.  Calvin Schultz has asymptomatic progression of CKD associated with findings of proteinuria and metabolic acidosis, as well as findings of staghorn calculi and a L hydroureter due to a UVP process.  He has undergone surgical procedures for former large stones over the decades.  Hematuria is suspected to be arising from mucosal injury due to stone abrasions.  LE edema may be a result of hypoalbuminemia third spacing resulting from proteinuria.  We appreciate expertise of nephrologist and urologist in determining next steps.  Given his asymptomatic nature he is hoping that further investigations can be done as an outpatient.    Miguel Aschoff, MD 05/31/2023, 7:09 PM

## 2023-05-30 NOTE — Hospital Course (Addendum)
Calvin Schultz is a 73 y.o. with a PMH significant for HTN, T2DM, HLD, Afib, CKD who presented to the ED after prompting by PCP because of elevated Cr levels and admitted for an AKI superimposed on CKD.  AKI superimposed on CKD stage V NAGMA Proteinuria Staghorn calculus (L)- non-obstructing Hx of bilateral staghorn calculi s/p surgery mod to severe hydronephrosis on the left  Nephrolithiasis  Patient obtained labs for a routine PCP appointment. Creatine found to be 4.46 on admission. Per chart review, Uncertain if creatinine had been increasing chronically, there was a gap in the labs from 2023 to 2025 (Cr 2.48 > 4.06). On admission, patient was asymptomatic except for significant bilateral leg swelling. Concern swelling may have been related to nephrotic syndrome related to diabetic kidney disease or heart-related. Echo revealed EF 60-65%. High dose lasix 80 TID started for volume overload. Sevelamer carbonate started with increased phosphorus levels. On admission, patient also had slight leukocytosis (12.0) in addition to large leukocytes, few bacteria and WBC>50 on UA. Patient received 1 dose of ceftriaxone. Urine cultures revealed multiple species present.  Nephrotic labs unremarkable other than increased kappa, lambda and kappa/lambda ratio. K/L ratio <3, less concerning for plasma cell disorder and more consistent with CKD. Protein electrophoresis WNL and M-spike not observed.  Renal U/S revealed moderate L hydronephrosis. CT revealed several calculi in the left renal collecting system with possible staghorn calculus concerning for L UPJ stricture. Given rising creatinine levels, patient underwent a left percutaneous nephrostomy tube placement on 06/03/2023 with outpt percutaneous nephrolithotomy recommended. After procedure, purulent fluid drained from nephrostomy tube was concerning for infection. Culture grew moderate WBC, predominately PMN with rare gram positive cocci in clusters. Continued  ceftriaxone for 2 more days. At discharge, speciation pending with few staphylococcus epidermidis growing. Patient remained asymptomatic. Patient transitioned from IV ceftriaxone to oral Bactrim per urology's recommendation.  During admission, lasix 80 mg IV TID decreased to lasix 80 mg IV daily with discontinuation after edema resolution. Patient's b/l LE edema significantly improved during admission.    -Pt to follow up with [facility] for CKD Stage V -Pt to follow up with Urology for antibiotic sensitivities, discharged on 15 days of Bactrim per Urology.  -Pt to follow up with Urology in 2-3 weeks for percutaneous nephrostomy tube placement  -Pt has an appt with IR in 4-6 weeks   Elevated PSA  Routine pre-hospital labs revealed a PSA of 17.86. As PSA can be elevated with acute injury, recommend outpatient follow-up. Timing of PSA may be more than 4-6 weeks as surgery can cause increased levels of PSA.  -Pt to follow up with Urology   Iron Deficiency Anemia Microscopic Hematuria Hbg on admission 8.0 with iron level 34, and ferritin 23. RBC 11-20 in urine microscopy. Per chart review, (+) cologuard in 2023. Do not see colonoscopy follow-up.  -Pt will need outpatient colonoscopy.   HTN  Patient on home Olmesartan Medoxomil prior to admission. Given AKI, medication held and amlodipine 5 mg started. Patient continued on amlodipine at discharge given CKD.   HLD  Last lipid panel noted on 07/19/21 WNL. Patient was continued on home rosuvastatin.   TII DM  Patient's glucose stable during admission. He reports using insulin PRN as needed at home prior to admission with hx of uncontrolled DM. Hbg A1c on prior hospital labs 6.4.  -ctm outpatient   A Fib  On Eliquis at home. Eliquis continued during hospitalization and held prior to IR procedure. Restarted prior to discharge. -ctm  outpatient

## 2023-05-30 NOTE — Consult Note (Addendum)
Renal Service Consult Note [facility]  Calvin Schultz 05/30/2023 Calvin Krabbe, MD Requesting Physician: Dr. Charissa Bash  Reason for Consult: Renal failure  HPI: The patient is a 72 y.o. year-old w/ PMH as below who presented to ED this am due to office labs that showed new renal failure. In the ED pt was asymptomatic, no hx of kidney disease. Labs here showed creat 4.4, UA shows >300 protein, > 50 wbc w/ clumps, few bacteria. WBC 12k, no fever, VS wnl, no UTI symptoms. Pt admitted for AKI. We are asked to see for renal failure.   Pt seen in ED. Pt states he is voiding well, no change in UOP or color. His PCP has told him along the way that he has had some kidney issues, but he doesn't know much other than that. He does not have a kidney doctor. He did have stone surgeries over 30 yrs ago, once on each side, in the flank area, they went in and pulled the stones out. He is diabetic on insulin < 10 yrs. Has been having sig LE swelling for several months. He has no SOB or orthopnea, cough, etc.    ROS - denies CP, no joint pain, no HA, no blurry vision, no rash, no diarrhea, no nausea/ vomiting, no dysuria, no difficulty voiding   Past Medical History  Past Medical History:  Diagnosis Date   Arthritis    CKD (chronic kidney disease) stage 3, GFR 30-59 ml/min (HCC)    Diabetes mellitus without complication (HCC)    Hyperlipidemia    Hypertension    Past Surgical History  Past Surgical History:  Procedure Laterality Date   KIDNEY SURGERY     Family History  Family History  Problem Relation Age of Onset   Dementia Mother    Social History  reports that he has never smoked. He has never used smokeless tobacco. He reports that he does not drink alcohol and does not use drugs. Allergies No Known Allergies Home medications Prior to Admission medications   Medication Sig Start Date End Date Taking? Authorizing Provider  doxycycline (VIBRA-TABS) 100 MG tablet Take 1  tablet (100 mg total) by mouth 2 (two) times daily. 07/08/21   Vivi Barrack, DPM  ELIQUIS 5 MG TABS tablet Take 5 mg by mouth 2 (two) times daily. 08/24/21   [provider]  insulin aspart (NOVOLOG) 100 UNIT/ML injection Inject into the skin 3 (three) times daily before meals.    [provider]  insulin glargine (LANTUS) 100 UNIT/ML injection Inject into the skin at bedtime.    [provider]  ketoconazole (NIZORAL) 2 % cream Apply 1 fingertip amount to toenails and bottom of each foot daily. 04/19/21   Park Liter, DPM  LEVEMIR FLEXTOUCH 100 UNIT/ML FlexTouch Pen Inject 20 Units into the skin at bedtime. 06/27/21   [provider]  lisinopril (ZESTRIL) 10 MG tablet Take 20 mg by mouth in the morning. 04/19/21   [provider]  metoprolol succinate (TOPROL-XL) 25 MG 24 hr tablet TAKE 1 TABLET BY MOUTH EVERY DAY IN THE MORNING 05/26/22   Tolia, Sunit, DO  rosuvastatin (CRESTOR) 40 MG tablet Take 40 mg by mouth daily. 08/27/21   [provider]  silver sulfADIAZINE (SILVADENE) 1 % cream Apply pea-sized amount to wound daily. 04/19/21   Park Liter, DPM     Vitals:   05/30/23 0830 05/30/23 0845 05/30/23 0900 05/30/23 0930  BP: (!) 154/82 (!) 165/130 Marland Kitchen)  153/105 (!) 163/66  Pulse: 89 65 71 86  Resp: (!) 21     Temp:      TempSrc:      SpO2: 98% 100% 100% 100%  Weight:      Height:       Exam Gen alert, no distress No rash, cyanosis or gangrene Sclera anicteric, throat clear  No jvd or bruits Chest clear bilat to bases, no rales/ wheezing RRR no MRG Abd soft ntnd no mass or ascites +bs GU nl male  MS no joint effusions or deformity Ext 2-3+ bilat pretib edema, 1+ hip edema, no other edema Neuro is alert, Ox 3 , nf, no asterixis.    Renal-related home meds: - lisinopril 20 qam - toprol xl 25 qam - others: crestor, insulins, eliquis, doxycycline   Date   Creat  eGFR  (ml/min) 04/18/2021  1.87  39 06/25/21  2.00  36 ml/min 07/2021   1.73  42 09/2021   2.17  32 12/2021   2.51  27 01/2022   2.48  28 ml/min, early stage IV 05/29/23  4.06  15 ml/min 05/30/23  4.46  13 ml/min   Renal US 05/30/23:     - R kidney 11 cm, no hydro, normal echo     - L kidney 12.8 cm, moderate hydro present, normal echo     - Bladder appears normal   UA 05/30/23 - turbid, amber, glu 150, mod Hb, large LE, prot >300, 11-20 rbc/ >50 wbc     BP's 140 -180/ 66- 95, HR 74-100, RR 20-21  on RA 100%   Assessment/ Plan: AKI on CKD 4 - b/l creat difficult to say, most recent labs are from 2023, creat 2.1- 2.4, eGFR 27-32 ml/min. Creat here is 4.6 which could you a new baseline, or AKI.  Pt is asymptomatic except for sig LE swelling which has been going on for several months. This may be nephrotic syndrome related to diabetic kidney disease, or could be heart -related. Would get echo. UA suggests UTI. Get UP/C ratio. L kidney shows significant hydronephrosis on the renal US --> have ordered noncon CT scan of abd/pelvis for this. BP's are high. Suspect CKD is due to HTN + DM2. Recommend high dose IV lasix 80 tid to start for the volume overload. Will follow.  L hydronephrosis - may be acute, he is not symptomatic. Ordered noncon CT to get more information. Will need urology if stones are involved (remote hx of stones causing blockage).  Volume overload - sig bilat LE edema, will try to diurese as above. Consider echo if not done recently. Will get baseline CXR.  HTN - hold acei while diuresing and w/ potential AKI. Can use BB, CCB's , hydralazine.  DM2 - on insulin Anemia - per pmd Atrial fib        [name]  MD [facility] 05/30/2023, 2:06 PM  Recent Labs  Lab 05/30/23 0856  HGB 8.0*  ALBUMIN 2.9*  CALCIUM 8.8*  CREATININE 4.46*  K 3.8   Inpatient medications:  heparin  5,000 Units Subcutaneous Q8H    acetaminophen **OR** acetaminophen, senna-docusate

## 2023-05-30 NOTE — ED Notes (Signed)
Pt ambulated to restroom without incident.

## 2023-05-30 NOTE — ED Triage Notes (Signed)
Pt called by PCP this morning and advised to come to the ED regarding AKI shown on blood work drawn at the office yesterday. Creatinine 4.06. Patient has no new complaints.

## 2023-05-30 NOTE — ED Provider Notes (Signed)
[place] [facility]ERGENCY DEPARTMENT AT [facility] Provider Note   CSN: [id number] Arrival date & time: 05/30/23  4166     History  Chief Complaint  Patient presents with   Abnormal Lab    Calvin Schultz is a 73 y.o. male.  Patient sent to ED by PCP after routine labs done yesterday showed an elevated Cr reported at 4.06. The patient is asymptomatic and without complaint. The labs were done as part of his general physical exam scheduled for next week. No history of kidney disease.   The history is provided by the patient and the spouse. No language interpreter was used.  Abnormal Lab      Home Medications Prior to Admission medications   Medication Sig Start Date End Date Taking? Authorizing Provider  doxycycline (VIBRA-TABS) 100 MG tablet Take 1 tablet (100 mg total) by mouth 2 (two) times daily. 07/08/21   Vivi Barrack, DPM  ELIQUIS 5 MG TABS tablet Take 5 mg by mouth 2 (two) times daily. 08/24/21   [provider]  insulin aspart (NOVOLOG) 100 UNIT/ML injection Inject into the skin 3 (three) times daily before meals.    [provider]  insulin glargine (LANTUS) 100 UNIT/ML injection Inject into the skin at bedtime.    [provider]  ketoconazole (NIZORAL) 2 % cream Apply 1 fingertip amount to toenails and bottom of each foot daily. 04/19/21   Park Liter, DPM  LEVEMIR FLEXTOUCH 100 UNIT/ML FlexTouch Pen Inject 20 Units into the skin at bedtime. 06/27/21   [provider]  lisinopril (ZESTRIL) 10 MG tablet Take 20 mg by mouth in the morning. 04/19/21   [provider]  metoprolol succinate (TOPROL-XL) 25 MG 24 hr tablet TAKE 1 TABLET BY MOUTH EVERY DAY IN THE MORNING 05/26/22   Tolia, Sunit, DO  rosuvastatin (CRESTOR) 40 MG tablet Take 40 mg by mouth daily. 08/27/21   [provider]  silver sulfADIAZINE (SILVADENE) 1 % cream Apply pea-sized amount to wound daily. 04/19/21   Park Liter, DPM       Allergies    Patient has no known allergies.    Review of Systems   Review of Systems  Physical Exam Updated Vital Signs BP (!) 163/66   Pulse 86   Temp 98 F (36.7 C) (Oral)   Resp (!) 21   Ht 5\' 9"  (1.753 m)   Wt 104.3 kg   SpO2 100%   BMI 33.97 kg/m  Physical Exam Constitutional:      Appearance: He is well-developed.  HENT:     Head: Normocephalic.  Cardiovascular:     Rate and Rhythm: Normal rate and regular rhythm.     Heart sounds: No murmur heard. Pulmonary:     Effort: Pulmonary effort is normal.     Breath sounds: Normal breath sounds. No wheezing, rhonchi or rales.  Abdominal:     Palpations: Abdomen is soft.     Tenderness: There is no abdominal tenderness. There is no right CVA tenderness, left CVA tenderness, guarding or rebound.  Musculoskeletal:        General: Normal range of motion.     Cervical back: Normal range of motion and neck supple.  Skin:    General: Skin is warm and dry.  Neurological:     General: No focal deficit present.     Mental Status: He is alert and oriented to person, place, and time.     ED Results / Procedures / Treatments  Labs (all labs ordered are listed, but only abnormal results are displayed) Labs Reviewed  CBC WITH DIFFERENTIAL/PLATELET - Abnormal; Notable for the following components:      Result Value   WBC 12.0 (*)    RBC 2.96 (*)    Hemoglobin 8.0 (*)    HCT 26.4 (*)    Neutro Abs 9.9 (*)    Monocytes Absolute 1.1 (*)    All other components within normal limits  COMPREHENSIVE METABOLIC PANEL - Abnormal; Notable for the following components:   CO2 15 (*)    Glucose, Bld 128 (*)    BUN 55 (*)    Creatinine, Ser 4.46 (*)    Calcium 8.8 (*)    Total Protein 6.1 (*)    Albumin 2.9 (*)    AST 14 (*)    GFR, Estimated 13 (*)    All other components within normal limits  URINALYSIS, ROUTINE W REFLEX MICROSCOPIC - Abnormal; Notable for the following components:   Color, Urine AMBER (*)    APPearance  TURBID (*)    Glucose, UA 150 (*)    Hgb urine dipstick MODERATE (*)    Protein, ur >=300 (*)    Leukocytes,Ua LARGE (*)    Bacteria, UA FEW (*)    All other components within normal limits  URINE CULTURE   Results for orders placed or performed during the hospital encounter of 05/30/23  CBC with Differential   Collection Time: 05/30/23  8:56 AM  Result Value Ref Range   WBC 12.0 (H) 4.0 - 10.5 K/uL   RBC 2.96 (L) 4.22 - 5.81 MIL/uL   Hemoglobin 8.0 (L) 13.0 - 17.0 g/dL   HCT 16.1 (L) 09.6 - 04.5 %   MCV 89.2 80.0 - 100.0 fL   MCH 27.0 26.0 - 34.0 pg   MCHC 30.3 30.0 - 36.0 g/dL   RDW 40.9 81.1 - 91.4 %   Platelets 221 150 - 400 K/uL   nRBC 0.0 0.0 - 0.2 %   Neutrophils Relative % 83 %   Neutro Abs 9.9 (H) 1.7 - 7.7 K/uL   Lymphocytes Relative 6 %   Lymphs Abs 0.7 0.7 - 4.0 K/uL   Monocytes Relative 9 %   Monocytes Absolute 1.1 (H) 0.1 - 1.0 K/uL   Eosinophils Relative 2 %   Eosinophils Absolute 0.2 0.0 - 0.5 K/uL   Basophils Relative 0 %   Basophils Absolute 0.0 0.0 - 0.1 K/uL   Immature Granulocytes 0 %   Abs Immature Granulocytes 0.04 0.00 - 0.07 K/uL  Comprehensive metabolic panel   Collection Time: 05/30/23  8:56 AM  Result Value Ref Range   Sodium 140 135 - 145 mmol/L   Potassium 3.8 3.5 - 5.1 mmol/L   Chloride 111 98 - 111 mmol/L   CO2 15 (L) 22 - 32 mmol/L   Glucose, Bld 128 (H) 70 - 99 mg/dL   BUN 55 (H) 8 - 23 mg/dL   Creatinine, Ser 7.82 (H) 0.61 - 1.24 mg/dL   Calcium 8.8 (L) 8.9 - 10.3 mg/dL   Total Protein 6.1 (L) 6.5 - 8.1 g/dL   Albumin 2.9 (L) 3.5 - 5.0 g/dL   AST 14 (L) 15 - 41 U/L   ALT 16 0 - 44 U/L   Alkaline Phosphatase 101 38 - 126 U/L   Total Bilirubin 0.6 0.0 - 1.2 mg/dL   GFR, Estimated 13 (L) >60 mL/min   Anion gap 14 5 - 15  Urinalysis, Routine w reflex  microscopic -Urine, Clean Catch   Collection Time: 05/30/23  8:56 AM  Result Value Ref Range   Color, Urine AMBER (A) YELLOW   APPearance TURBID (A) CLEAR   Specific Gravity, Urine  1.011 1.005 - 1.030   pH 6.0 5.0 - 8.0   Glucose, UA 150 (A) NEGATIVE mg/dL   Hgb urine dipstick MODERATE (A) NEGATIVE   Bilirubin Urine NEGATIVE NEGATIVE   Ketones, ur NEGATIVE NEGATIVE mg/dL   Protein, ur >=621 (A) NEGATIVE mg/dL   Nitrite NEGATIVE NEGATIVE   Leukocytes,Ua LARGE (A) NEGATIVE   RBC / HPF 11-20 0 - 5 RBC/hpf   WBC, UA >50 0 - 5 WBC/hpf   Bacteria, UA FEW (A) NONE SEEN   Squamous Epithelial / HPF 0-5 0 - 5 /HPF   WBC Clumps PRESENT      EKG None  Radiology No results found.  Procedures Procedures    Medications Ordered in ED Medications - No data to display  ED Course/ Medical Decision Making/ A&P                                 Medical Decision Making Here as advised by PCP reporting elevated Cr yesterday of 4.06. On chart review it appears his Cr has been progressively worsening over the last 2 years with last Cr 2.48 01/2022. He is asymptomatic.   VSS. Cr confirmed to be significantly elevated in ED today of 4.46 with GFR of 13. Urine with >300 protein, >50 WBC with clumps, few bacteria. Culture pending. Mild WBC of 12. No fever, normal vital signs, no UTI symptoms.   Discussed imaging with nephrology Arta Silence) who advises renal US, which was ordered. Patient to be admitted for AKI. Patient and spouse updated on plan of care.   Amount and/or Complexity of Data Reviewed Labs: ordered.           Final Clinical Impression(s) / ED Diagnoses Final diagnoses:  AKI (acute kidney injury) [facility])    Rx / DC Orders ED Discharge Orders     None         Elpidio Anis, PA-C 05/30/23 1039    Tegeler, Canary Brim, MD 05/30/23 [phone]

## 2023-05-30 NOTE — ED Notes (Signed)
Patient transported to Ultrasound

## 2023-05-30 NOTE — ED Notes (Signed)
Pt ambulated to restroom with cane without incident

## 2023-05-31 ENCOUNTER — Observation Stay (HOSPITAL_BASED_OUTPATIENT_CLINIC_OR_DEPARTMENT_OTHER): Payer: Medicare Other

## 2023-05-31 DIAGNOSIS — D649 Anemia, unspecified: Secondary | ICD-10-CM | POA: Diagnosis present

## 2023-05-31 DIAGNOSIS — E872 Acidosis, unspecified: Secondary | ICD-10-CM | POA: Diagnosis present

## 2023-05-31 DIAGNOSIS — N135 Crossing vessel and stricture of ureter without hydronephrosis: Secondary | ICD-10-CM

## 2023-05-31 DIAGNOSIS — E785 Hyperlipidemia, unspecified: Secondary | ICD-10-CM | POA: Diagnosis present

## 2023-05-31 DIAGNOSIS — Z794 Long term (current) use of insulin: Secondary | ICD-10-CM | POA: Diagnosis not present

## 2023-05-31 DIAGNOSIS — R600 Localized edema: Secondary | ICD-10-CM | POA: Diagnosis present

## 2023-05-31 DIAGNOSIS — N200 Calculus of kidney: Secondary | ICD-10-CM | POA: Diagnosis not present

## 2023-05-31 DIAGNOSIS — R809 Proteinuria, unspecified: Secondary | ICD-10-CM | POA: Diagnosis present

## 2023-05-31 DIAGNOSIS — N189 Chronic kidney disease, unspecified: Secondary | ICD-10-CM | POA: Diagnosis not present

## 2023-05-31 DIAGNOSIS — D509 Iron deficiency anemia, unspecified: Secondary | ICD-10-CM | POA: Diagnosis present

## 2023-05-31 DIAGNOSIS — N136 Pyonephrosis: Secondary | ICD-10-CM | POA: Diagnosis present

## 2023-05-31 DIAGNOSIS — I482 Chronic atrial fibrillation, unspecified: Secondary | ICD-10-CM | POA: Diagnosis present

## 2023-05-31 DIAGNOSIS — D631 Anemia in chronic kidney disease: Secondary | ICD-10-CM | POA: Diagnosis present

## 2023-05-31 DIAGNOSIS — Z7901 Long term (current) use of anticoagulants: Secondary | ICD-10-CM | POA: Diagnosis not present

## 2023-05-31 DIAGNOSIS — E876 Hypokalemia: Secondary | ICD-10-CM | POA: Diagnosis present

## 2023-05-31 DIAGNOSIS — R310 Gross hematuria: Secondary | ICD-10-CM | POA: Diagnosis not present

## 2023-05-31 DIAGNOSIS — N179 Acute kidney failure, unspecified: Principal | ICD-10-CM

## 2023-05-31 DIAGNOSIS — Z87442 Personal history of urinary calculi: Secondary | ICD-10-CM | POA: Diagnosis not present

## 2023-05-31 DIAGNOSIS — I083 Combined rheumatic disorders of mitral, aortic and tricuspid valves: Secondary | ICD-10-CM | POA: Diagnosis present

## 2023-05-31 DIAGNOSIS — R972 Elevated prostate specific antigen [PSA]: Secondary | ICD-10-CM

## 2023-05-31 DIAGNOSIS — I120 Hypertensive chronic kidney disease with stage 5 chronic kidney disease or end stage renal disease: Secondary | ICD-10-CM | POA: Diagnosis present

## 2023-05-31 DIAGNOSIS — M1990 Unspecified osteoarthritis, unspecified site: Secondary | ICD-10-CM | POA: Diagnosis present

## 2023-05-31 DIAGNOSIS — E1122 Type 2 diabetes mellitus with diabetic chronic kidney disease: Secondary | ICD-10-CM | POA: Diagnosis present

## 2023-05-31 DIAGNOSIS — H9319 Tinnitus, unspecified ear: Secondary | ICD-10-CM | POA: Diagnosis present

## 2023-05-31 DIAGNOSIS — R8290 Unspecified abnormal findings in urine: Secondary | ICD-10-CM | POA: Diagnosis present

## 2023-05-31 DIAGNOSIS — N185 Chronic kidney disease, stage 5: Secondary | ICD-10-CM | POA: Diagnosis present

## 2023-05-31 DIAGNOSIS — R001 Bradycardia, unspecified: Secondary | ICD-10-CM | POA: Diagnosis present

## 2023-05-31 DIAGNOSIS — Z79899 Other long term (current) drug therapy: Secondary | ICD-10-CM | POA: Diagnosis not present

## 2023-05-31 DIAGNOSIS — M170 Bilateral primary osteoarthritis of knee: Secondary | ICD-10-CM | POA: Diagnosis present

## 2023-05-31 DIAGNOSIS — R944 Abnormal results of kidney function studies: Secondary | ICD-10-CM | POA: Diagnosis present

## 2023-05-31 DIAGNOSIS — E8770 Fluid overload, unspecified: Secondary | ICD-10-CM | POA: Diagnosis present

## 2023-05-31 LAB — ECHOCARDIOGRAM COMPLETE
Area-P 1/2: 5.97 cm2
Height: 69 in
S' Lateral: 4.3 cm
Weight: 3809.55 [oz_av]

## 2023-05-31 LAB — CBC
HCT: 28.2 % — ABNORMAL LOW (ref 39.0–52.0)
Hemoglobin: 8.5 g/dL — ABNORMAL LOW (ref 13.0–17.0)
MCH: 26.8 pg (ref 26.0–34.0)
MCHC: 30.1 g/dL (ref 30.0–36.0)
MCV: 89 fL (ref 80.0–100.0)
Platelets: 260 10*3/uL (ref 150–400)
RBC: 3.17 MIL/uL — ABNORMAL LOW (ref 4.22–5.81)
RDW: 14.1 % (ref 11.5–15.5)
WBC: 12.1 10*3/uL — ABNORMAL HIGH (ref 4.0–10.5)
nRBC: 0 % (ref 0.0–0.2)

## 2023-05-31 LAB — URINE CULTURE

## 2023-05-31 LAB — BASIC METABOLIC PANEL
Anion gap: 13 (ref 5–15)
BUN: 53 mg/dL — ABNORMAL HIGH (ref 8–23)
CO2: 16 mmol/L — ABNORMAL LOW (ref 22–32)
Calcium: 9.2 mg/dL (ref 8.9–10.3)
Chloride: 111 mmol/L (ref 98–111)
Creatinine, Ser: 4.43 mg/dL — ABNORMAL HIGH (ref 0.61–1.24)
GFR, Estimated: 14 mL/min — ABNORMAL LOW (ref >60)
Glucose, Bld: 77 mg/dL (ref 70–99)
Potassium: 3.5 mmol/L (ref 3.5–5.1)
Sodium: 140 mmol/L (ref 135–145)

## 2023-05-31 LAB — HEPATITIS PANEL, ACUTE
HCV Ab: NONREACTIVE
Hep A IgM: NONREACTIVE
Hep B C IgM: NONREACTIVE
Hepatitis B Surface Ag: NONREACTIVE

## 2023-05-31 LAB — MRSA NEXT GEN BY PCR, NASAL: MRSA by PCR Next Gen: NOT DETECTED

## 2023-05-31 MED ORDER — SODIUM CHLORIDE 0.9 % IV SOLN
1.0000 g | INTRAVENOUS | Status: DC
  Administered 2023-05-31: 1 g via INTRAVENOUS
  Filled 2023-05-31: qty 10

## 2023-05-31 MED ORDER — SEVELAMER CARBONATE 800 MG PO TABS
800.0000 mg | ORAL_TABLET | Freq: Three times a day (TID) | ORAL | Status: DC
  Administered 2023-06-01 – 2023-06-05 (×12): 800 mg via ORAL
  Filled 2023-05-31 (×13): qty 1

## 2023-05-31 NOTE — Care Management Obs Status (Signed)
MEDICARE OBSERVATION STATUS NOTIFICATION   Patient Details  Name: Calvin Schultz MRN: [id number] Date of Birth: Feb 04, 1951   Medicare Observation Status Notification Given:  Yes    Ronny Bacon, RN 05/31/2023, 9:02 AM

## 2023-05-31 NOTE — Consult Note (Signed)
Urology Consult  Referring physician: Dr. Mayford Knife Reason for referral: Elevated PSA and bilateral renal calculi  Chief Complaint: bilateral renal calculi  History of Present Illness: Mr Calvin Schultz is a 73yo wit a history of nephrolithiasis, CKD who was admitted with acute renal failure. Ct scan in the ER shows bilaterla renal calculi moderate left hydronephrosis due to UPJ obstruction with staghorn calculus in the left renal pelvis. The patient denies nay flank pain. He denies any significant LUTS. He denies fevers. He is urinating normally per patient. He has a history of a right pyelolithotomy and then a left percutaneous nephrostolithotomy.  He was found to have an elevated PSA of 7 on admission. Patient does not recall his prior PSA values. He denies any dysuria or recent UTI.   Past Medical History:  Diagnosis Date   Arthritis    CKD (chronic kidney disease) stage 3, GFR 30-59 ml/min (HCC)    Diabetes mellitus without complication (HCC)    Hyperlipidemia    Hypertension    Past Surgical History:  Procedure Laterality Date   KIDNEY SURGERY      Medications: I have reviewed the patient's current medications. Allergies: No Known Allergies  Family History  Problem Relation Age of Onset   Dementia Mother    Social History:  reports that he has never smoked. He has never used smokeless tobacco. He reports that he does not drink alcohol and does not use drugs.  Review of Systems  All other systems reviewed and are negative.   Physical Exam:  Vital signs in last 24 hours: Temp:  [97.8 F (36.6 C)-98.4 F (36.9 C)] 98 F (36.7 C) (01/26 0757) Pulse Rate:  [68-91] 91 (01/26 0757) Resp:  [16-20] 18 (01/26 0757) BP: (135-180)/(71-102) 162/97 (01/26 0757) SpO2:  [97 %-100 %] 99 % (01/26 0757) Weight:  [638 kg] 108 kg (01/26 0500) Physical Exam Vitals reviewed.  Constitutional:      Appearance: Normal appearance.  HENT:     Head: Normocephalic and atraumatic.     Mouth/Throat:      Mouth: Mucous membranes are dry.  Eyes:     Extraocular Movements: Extraocular movements intact.     Pupils: Pupils are equal, round, and reactive to light.  Cardiovascular:     Rate and Rhythm: Normal rate and regular rhythm.  Abdominal:     General: Abdomen is flat. There is no distension.  Musculoskeletal:        General: Swelling present. Normal range of motion.     Cervical back: Normal range of motion and neck supple.  Skin:    General: Skin is warm and dry.  Neurological:     General: No focal deficit present.     Mental Status: He is alert and oriented to person, place, and time.  Psychiatric:        Mood and Affect: Mood normal.        Behavior: Behavior normal.        Thought Content: Thought content normal.        Judgment: Judgment normal.     Laboratory Data:  Results for orders placed or performed during the hospital encounter of 05/30/23 (from the past 72 hours)  CBC with Differential     Status: Abnormal   Collection Time: 05/30/23  8:56 AM  Result Value Ref Range   WBC 12.0 (H) 4.0 - 10.5 K/uL   RBC 2.96 (L) 4.22 - 5.81 MIL/uL   Hemoglobin 8.0 (L) 13.0 - 17.0 g/dL   HCT  26.4 (L) 39.0 - 52.0 %   MCV 89.2 80.0 - 100.0 fL   MCH 27.0 26.0 - 34.0 pg   MCHC 30.3 30.0 - 36.0 g/dL   RDW 02.7 25.3 - 66.4 %   Platelets 221 150 - 400 K/uL   nRBC 0.0 0.0 - 0.2 %   Neutrophils Relative % 83 %   Neutro Abs 9.9 (H) 1.7 - 7.7 K/uL   Lymphocytes Relative 6 %   Lymphs Abs 0.7 0.7 - 4.0 K/uL   Monocytes Relative 9 %   Monocytes Absolute 1.1 (H) 0.1 - 1.0 K/uL   Eosinophils Relative 2 %   Eosinophils Absolute 0.2 0.0 - 0.5 K/uL   Basophils Relative 0 %   Basophils Absolute 0.0 0.0 - 0.1 K/uL   Immature Granulocytes 0 %   Abs Immature Granulocytes 0.04 0.00 - 0.07 K/uL    Comment: Performed at [facility], [address], [place], [place] [address]  Comprehensive metabolic panel     Status: Abnormal   Collection Time: 05/30/23  8:56 AM  Result Value Ref  Range   Sodium 140 135 - 145 mmol/L   Potassium 3.8 3.5 - 5.1 mmol/L   Chloride 111 98 - 111 mmol/L   CO2 15 (L) 22 - 32 mmol/L   Glucose, Bld 128 (H) 70 - 99 mg/dL    Comment: Glucose reference range applies only to samples taken after fasting for at least 8 hours.   BUN 55 (H) 8 - 23 mg/dL   Creatinine, Ser 4.25 (H) 0.61 - 1.24 mg/dL   Calcium 8.8 (L) 8.9 - 10.3 mg/dL   Total Protein 6.1 (L) 6.5 - 8.1 g/dL   Albumin 2.9 (L) 3.5 - 5.0 g/dL   AST 14 (L) 15 - 41 U/L   ALT 16 0 - 44 U/L   Alkaline Phosphatase 101 38 - 126 U/L   Total Bilirubin 0.6 0.0 - 1.2 mg/dL   GFR, Estimated 13 (L) >60 mL/min    Comment: (NOTE) Calculated using the CKD-EPI Creatinine Equation (2021)    Anion gap 14 5 - 15    Comment: Performed at [facility], [address] [place], [place] [address]  Urinalysis, Routine w reflex microscopic -Urine, Clean Catch     Status: Abnormal   Collection Time: 05/30/23  8:56 AM  Result Value Ref Range   Color, Urine AMBER (A) YELLOW    Comment: BIOCHEMICALS MAY BE AFFECTED BY COLOR   APPearance TURBID (A) CLEAR   Specific Gravity, Urine 1.011 1.005 - 1.030   pH 6.0 5.0 - 8.0   Glucose, UA 150 (A) NEGATIVE mg/dL   Hgb urine dipstick MODERATE (A) NEGATIVE   Bilirubin Urine NEGATIVE NEGATIVE   Ketones, ur NEGATIVE NEGATIVE mg/dL   Protein, ur >=756 (A) NEGATIVE mg/dL   Nitrite NEGATIVE NEGATIVE   Leukocytes,Ua LARGE (A) NEGATIVE   RBC / HPF 11-20 0 - 5 RBC/hpf   WBC, UA >50 0 - 5 WBC/hpf   Bacteria, UA FEW (A) NONE SEEN   Squamous Epithelial / HPF 0-5 0 - 5 /HPF   WBC Clumps PRESENT     Comment: Performed at [facility], [address] [place], [place] [address]  Magnesium     Status: None   Collection Time: 05/30/23  8:56 AM  Result Value Ref Range   Magnesium 2.0 1.7 - 2.4 mg/dL    Comment: Performed at [facility], [address], [place], [place]  [address]  Phosphorus     Status: Abnormal   Collection Time: 05/30/23  8:56 AM  Result  Value Ref Range   Phosphorus 5.8 (H) 2.5 - 4.6 mg/dL    Comment: Performed at [facility], [address] [place], [place] [address]  Urine Culture     Status: Abnormal (Preliminary result)   Collection Time: 05/30/23  9:44 AM   Specimen: Urine, Clean Catch  Result Value Ref Range   Specimen Description URINE, CLEAN CATCH    Special Requests NONE    Culture (A)     <10,000 COLONIES/mL INSIGNIFICANT GROWTH Performed at [facility], [address] [place], [place] [address]    Report Status PENDING   Creatinine, urine, random     Status: None   Collection Time: 05/30/23  5:04 PM  Result Value Ref Range   Creatinine, Urine 20 mg/dL    Comment: Performed at [facility], [address] [place], [place] [address]  Sodium, urine, random     Status: None   Collection Time: 05/30/23  5:04 PM  Result Value Ref Range   Sodium, Ur 114 mmol/L    Comment: Performed at [facility], [address], [place], [place] [address]  Protein / creatinine ratio, urine     Status: Abnormal   Collection Time: 05/30/23  5:04 PM  Result Value Ref Range   Creatinine, Urine 20 mg/dL   Total Protein, Urine 203 mg/dL    Comment: RESULT CONFIRMED BY MANUAL DILUTION NO NORMAL RANGE ESTABLISHED FOR THIS TEST    Protein Creatinine Ratio 10.15 (H) 0.00 - 0.15 mg/mg[Cre]    Comment: Performed at [facility] Lab, [address] [place], [place] [address]  MRSA Next Gen by PCR, Nasal     Status: None   Collection Time: 05/30/23  9:03 PM   Specimen: Nasal Mucosa; Nasal Swab  Result Value Ref Range   MRSA by PCR Next Gen NOT DETECTED NOT DETECTED    Comment: (NOTE) The GeneXpert MRSA Assay (FDA approved for NASAL specimens only), is one component of a comprehensive MRSA colonization surveillance program. It is not intended to diagnose MRSA infection nor to guide or monitor treatment for MRSA infections. Test performance is not FDA approved in patients less than 16  years old. Performed at [facility], [address] [place], [place] [address]   HIV Antibody (routine testing w rflx)     Status: None   Collection Time: 05/30/23  9:35 PM  Result Value Ref Range   HIV Screen 4th Generation wRfx Non Reactive Non Reactive    Comment: Performed at [facility], [address] [place], [place] [address]  CBC     Status: Abnormal   Collection Time: 05/31/23  5:42 AM  Result Value Ref Range   WBC 12.1 (H) 4.0 - 10.5 K/uL   RBC 3.17 (L) 4.22 - 5.81 MIL/uL   Hemoglobin 8.5 (L) 13.0 - 17.0 g/dL   HCT 72.5 (L) 36.6 - 44.0 %   MCV 89.0 80.0 - 100.0 fL   MCH 26.8 26.0 - 34.0 pg   MCHC 30.1 30.0 - 36.0 g/dL   RDW 34.7 42.5 - 95.6 %   Platelets 260 150 - 400 K/uL   nRBC 0.0 0.0 - 0.2 %    Comment: Performed at [facility], [address], [place], [place] [address]  Basic metabolic panel     Status: Abnormal  Collection Time: 05/31/23  5:42 AM  Result Value Ref Range   Sodium 140 135 - 145 mmol/L   Potassium 3.5 3.5 - 5.1 mmol/L   Chloride 111 98 - 111 mmol/L   CO2 16 (L) 22 - 32 mmol/L   Glucose, Bld 77 70 - 99 mg/dL    Comment: Glucose reference range applies only to samples taken after fasting for at least 8 hours.   BUN 53 (H) 8 - 23 mg/dL   Creatinine, Ser 1.61 (H) 0.61 - 1.24 mg/dL   Calcium 9.2 8.9 - 09.6 mg/dL   GFR, Estimated 14 (L) >60 mL/min    Comment: (NOTE) Calculated using the CKD-EPI Creatinine Equation (2021)    Anion gap 13 5 - 15    Comment: Performed at [facility], [address] [place], [place] [address]   Recent Results (from the past 240 hours)  Urine Culture     Status: Abnormal (Preliminary result)   Collection Time: 05/30/23  9:44 AM   Specimen: Urine, Clean Catch  Result Value Ref Range Status   Specimen Description URINE, CLEAN CATCH  Final   Special Requests NONE  Final   Culture (A)  Final    <10,000 COLONIES/mL INSIGNIFICANT GROWTH Performed at [facility], [address], [place], [place] [address]    Report Status PENDING  Incomplete  MRSA Next Gen by PCR, Nasal     Status: None   Collection Time: 05/30/23  9:03 PM   Specimen: Nasal Mucosa; Nasal Swab  Result Value Ref Range Status   MRSA by PCR Next Gen NOT DETECTED NOT DETECTED Final    Comment: (NOTE) The GeneXpert MRSA Assay (FDA approved for NASAL specimens only), is one component of a comprehensive MRSA colonization surveillance program. It is not intended to diagnose MRSA infection nor to guide or monitor treatment for MRSA infections. Test performance is not FDA approved in patients less than 18 years old. Performed at [facility], [address] [place], [place] [address]    Creatinine: Recent Labs    05/30/23 9562 05/31/23 0542  CREATININE 4.46* 4.43*   Baseline Creatinine: 2  Impression/Assessment:  73yo with bilateral renal calculi, Left UPJ obstrcution, elevated PSA  Plan:  Left UPJ obstruction: We discussed the natural hx of UPJ obstruction and the various treatment options including observation, stetn placement and nephrostomy tube placement. For now we have agreed to observe his chronic UPJ obstruction but if his creatinine fails to improve he would benefit from left nephrostomy tube placement.  Elevated PSA:The patient and I talked about etiologies of elevated PSA.  We discussed the possible relationship between elevated PSA and prostate cancer, BPH, prostatitis, infection trauma and recent ejaculations.  recommended that we follow-up with a repeat PSA in 6 weeks.  If it remains elevated with a positive rising trend we will discuss prostate biopsy at his follow-up appointment.   Wilkie Aye 05/31/2023, 1:09 PM

## 2023-05-31 NOTE — Progress Notes (Signed)
[facility] Progress Note  Subjective:pt states he filled 2-3 of the liter jugs w/ urine overnight. Swelling a bit better today.   Vitals:   05/30/23 2051 05/31/23 0440 05/31/23 0500 05/31/23 0757  BP: (!) 145/71 135/82  (!) 162/97  Pulse: 74 68  91  Resp: 16 18  18   Temp: 98.4 F (36.9 C) 98.1 F (36.7 C)  98 F (36.7 C)  TempSrc: Oral Oral  Oral  SpO2: 100% 98%  99%  Weight:   108 kg   Height:        Exam: Gen alert, no distress Sclera anicteric, throat clear  No jvd or bruits Chest clear bilat to bases RRR no MRG Abd soft ntnd no mass or ascites +bs Ext 2+ bilat pretib edema, 1+ hip edema Neuro is alert, Ox 3 , nf, no asterixis.     Renal-related home meds: - lisinopril 20 qam - toprol xl 25 qam - others: crestor, insulins, eliquis, doxycycline     Date                             Creat               eGFR (ml/min) 04/18/2021                  1.87                 39 06/25/21                        2.00                 36 ml/min 07/2021                         1.73                 42 09/2021                         2.17                 32 12/2021                         2.51                 27 01/2022                         2.48                 28 ml/min, early stage IV 05/29/23                        4.06                 15 ml/min 05/30/23                        4.46                 13 ml/min    Renal US 05/30/23:     - R kidney 11 cm, no hydro, normal echo     - L kidney 12.8 cm, moderate hydro present, normal echo     - Bladder appears normal  Abd / pelvis noncon 1/25 - nonobstructing calculus in the right kidney lower pole w/ mild right hydroureter however, no discrete obstructing mass/ ureteric calculus seen. No right hydronephrosis. Several calculi in the left kidney with largest branching calculus in the interpolar region measuring 1.3 x 1.7 cm. Moderate-to-severe left hydronephrosis with moderate thinning of the left renal cortex. However, left  ureter is nondilated. The above-mentioned calculus in the interpolar region may represent portion of staghorn calculus with probable associated renal UPJ stricture.     UA 05/30/23 - turbid, amber, glu 150, mod Hb, large LE, prot >300, 11-20 rbc/ >50 wbc     BP's 140 -180/ 66- 95, HR 74-100, RR 20-21  on RA 100%    CXR - no active disease   Assessment/ Plan: AKI on CKD 4 - b/l creat difficult to say, most recent labs are from 2023, creat 2.1- 2.4, eGFR 27-32 ml/min. Creat here is 4.4. They have not seen nephrology before and would like to f/u w/ [facility] after dc. No symptoms except sig bilat LE edema x several months. Echo pending. UP/C ratio = 10gm. UA suggests UTI. CT showed L kidney w/ significant hydronephrosis but no ureteral hydro, and there is L staghorn calculus on CT but no clear obstructing ureteral mass. Hx of bilat staghorn stones rx'd surgically (remote). BP's high. DM hx < 10 yrs w/o eye involvemnent. Will send serologies for other cause of proteinuria. Long hx HTN. Urology consult pending re: poss obstruction. AKI may be decomp HF and/ or obstruction. Creat stable today. Diuresing well w/ IV lasix 80 tid, will continue.  L hydronephrosis - as above, not symptomatic.  Volume overload - cont IV lasix as above, improving. Echo pend.  HTN - hold lisinopril while diuresing and w/ potential AKI. Norvasc started.  DM2 - on insulin Anemia - per pmd Atrial fib         Vinson Moselle MD  [facility] 05/31/2023, 12:06 PM  Recent Labs  Lab 05/30/23 0856 05/31/23 0542  HGB 8.0* 8.5*  ALBUMIN 2.9*  --   CALCIUM 8.8* 9.2  PHOS 5.8*  --   CREATININE 4.46* 4.43*  K 3.8 3.5   No results for input(s): "IRON", "TIBC", "FERRITIN" in the last 168 hours. Inpatient medications:  amLODipine  5 mg Oral Daily   apixaban  5 mg Oral BID   furosemide  80 mg Intravenous Q8H   rosuvastatin  40 mg Oral Daily    cefTRIAXone (ROCEPHIN)  IV 1 g (05/31/23 1110)   acetaminophen **OR** acetaminophen,  senna-docusate

## 2023-05-31 NOTE — Plan of Care (Signed)
Pt is alert and oriented x 4. Up as tolerated. Wife at bedside. No prns given. Vitals stable. NO tele events this shift.  Problem: Education: Goal: Knowledge of General Education information will improve Description: Including pain rating scale, medication(s)/side effects and non-pharmacologic comfort measures Outcome: Progressing   Problem: Health Behavior/Discharge Planning: Goal: Ability to manage health-related needs will improve Outcome: Progressing   Problem: Clinical Measurements: Goal: Ability to maintain clinical measurements within normal limits will improve Outcome: Progressing Goal: Will remain free from infection Outcome: Progressing Goal: Diagnostic test results will improve Outcome: Progressing Goal: Respiratory complications will improve Outcome: Progressing Goal: Cardiovascular complication will be avoided Outcome: Progressing   Problem: Activity: Goal: Risk for activity intolerance will decrease Outcome: Progressing   Problem: Nutrition: Goal: Adequate nutrition will be maintained Outcome: Progressing   Problem: Coping: Goal: Level of anxiety will decrease Outcome: Progressing   Problem: Elimination: Goal: Will not experience complications related to bowel motility Outcome: Progressing Goal: Will not experience complications related to urinary retention Outcome: Progressing   Problem: Pain Managment: Goal: General experience of comfort will improve and/or be controlled Outcome: Progressing   Problem: Safety: Goal: Ability to remain free from injury will improve Outcome: Progressing   Problem: Skin Integrity: Goal: Risk for impaired skin integrity will decrease Outcome: Progressing   Problem: Education: Goal: Knowledge of disease and its progression will improve Outcome: Progressing   Problem: Fluid Volume: Goal: Compliance with measures to maintain balanced fluid volume will improve Outcome: Progressing   Problem: Health Behavior/Discharge  Planning: Goal: Ability to manage health-related needs will improve Outcome: Progressing   Problem: Nutritional: Goal: Ability to make healthy dietary choices will improve Outcome: Progressing   Problem: Clinical Measurements: Goal: Complications related to the disease process, condition or treatment will be avoided or minimized Outcome: Progressing

## 2023-05-31 NOTE — Progress Notes (Signed)
  Echocardiogram 2D Echocardiogram has been performed.  Calvin Schultz 05/31/2023, 10:25 AM

## 2023-05-31 NOTE — Progress Notes (Signed)
HD#0 SUBJECTIVE:  Patient Summary: Calvin Schultz is a 73 y.o. with a PMH of HTN and T2DM and neprholithiasis presenting with concerns of an increased creatinine admitted for an AKI superimposed on CKD.   Overnight Events: Nephro saw him. Renal US showing hydronephrosis.  CT ordered, concerns for CHF with worsening BLE edema.   Interim History: Doing well, no symptoms. Hx of nephrolithiasis requiring surgical intervention bilaterally.   OBJECTIVE:  Vital Signs: Vitals:   05/31/23 0440 05/31/23 0500 05/31/23 0757 05/31/23 1554  BP: 135/82  (!) 162/97 (!) 147/91  Pulse: 68  91 81  Resp: 18  18 16   Temp: 98.1 F (36.7 C)  98 F (36.7 C) 97.9 F (36.6 C)  TempSrc: Oral  Oral Oral  SpO2: 98%  99% 100%  Weight:  108 kg    Height:       Supplemental O2: Room Air SpO2: 100 %  Filed Weights   05/30/23 0823 05/31/23 0500  Weight: 104.3 kg 108 kg     Intake/Output Summary (Last 24 hours) at 05/31/2023 1626 Last data filed at 05/31/2023 1300 Gross per 24 hour  Intake 480 ml  Output 200 ml  Net 280 ml   Net IO Since Admission: 280 mL [05/31/23 1626]  Physical Exam: Physical Exam HENT:     Nose: No congestion.  Cardiovascular:     Rate and Rhythm: Normal rate. Rhythm irregular.     Heart sounds: No murmur heard.    No friction rub. No gallop.  Pulmonary:     Effort: Pulmonary effort is normal. No respiratory distress.     Breath sounds: No wheezing or rales.  Abdominal:     General: There is no distension.     Palpations: Abdomen is soft.     Tenderness: There is no abdominal tenderness. There is no right CVA tenderness, left CVA tenderness, guarding or rebound.  Musculoskeletal:     Right lower leg: Edema present.     Left lower leg: Edema present.  Neurological:     Mental Status: He is oriented to person, place, and time.    Patient Lines/Drains/Airways Status     Active Line/Drains/Airways     Name Placement date Placement time Site Days   Peripheral IV  05/30/23 20 G Anterior;Right Forearm 05/30/23  0834  Forearm  1           ECHOCARDIOGRAM COMPLETE Result Date: 05/31/2023    ECHOCARDIOGRAM REPORT   Patient Name:   Calvin Schultz Date of Exam: 05/31/2023 Medical Rec #:  [id number]   Height:       69.0 in Accession #:    [id number]  Weight:       238.1 lb Date of Birth:  1950-09-30  BSA:          2.225 m Patient Age:    72 years    BP:           162/97 mmHg Patient Gender: M           HR:           76 bpm. Exam Location:  Inpatient Procedure: 2D Echo, Color Doppler and Cardiac Doppler Indications:    Lower extremity edema  History:        Patient has no prior history of Echocardiogram examinations.                 Chronic Kidney Disease.  Sonographer:    Rosaland Lao Sonographer#2:  Delcie Roch RDCS Referring Phys:  [id number] JULIE ANNE WILLIAMS IMPRESSIONS  1. Left ventricular ejection fraction, by estimation, is 60 to 65%. The left ventricle has normal function. The left ventricle has no regional wall motion abnormalities. The left ventricular internal cavity size was moderately dilated. There is moderate  left ventricular hypertrophy. Left ventricular diastolic parameters were normal.  2. Right ventricular systolic function is normal. The right ventricular size is mildly enlarged.  3. Left atrial size was mildly dilated.  4. Right atrial size was mildly dilated.  5. The mitral valve is normal in structure. Mild mitral valve regurgitation. No evidence of mitral stenosis.  6. The aortic valve is tricuspid. Aortic valve regurgitation is not visualized. Aortic valve sclerosis is present, with no evidence of aortic valve stenosis.  7. Aortic dilatation noted. There is borderline dilatation of the aortic root, measuring 38 mm.  8. The inferior vena cava is normal in size with greater than 50% respiratory variability, suggesting right atrial pressure of 3 mmHg. Comparison(s): No prior Echocardiogram. FINDINGS  Left Ventricle: Left ventricular ejection fraction, by  estimation, is 60 to 65%. The left ventricle has normal function. The left ventricle has no regional wall motion abnormalities. The left ventricular internal cavity size was moderately dilated. There is moderate left ventricular hypertrophy. Left ventricular diastolic parameters were normal. Right Ventricle: The right ventricular size is mildly enlarged. Right ventricular systolic function is normal. Left Atrium: Left atrial size was mildly dilated. Right Atrium: Right atrial size was mildly dilated. Pericardium: There is no evidence of pericardial effusion. Mitral Valve: The mitral valve is normal in structure. Mild mitral annular calcification. Mild mitral valve regurgitation. No evidence of mitral valve stenosis. Tricuspid Valve: The tricuspid valve is normal in structure. Tricuspid valve regurgitation is trivial. No evidence of tricuspid stenosis. Aortic Valve: The aortic valve is tricuspid. Aortic valve regurgitation is not visualized. Aortic valve sclerosis is present, with no evidence of aortic valve stenosis. Pulmonic Valve: The pulmonic valve was normal in structure. Pulmonic valve regurgitation is not visualized. No evidence of pulmonic stenosis. Aorta: Aortic dilatation noted. There is borderline dilatation of the aortic root, measuring 38 mm. Venous: The inferior vena cava is normal in size with greater than 50% respiratory variability, suggesting right atrial pressure of 3 mmHg. IAS/Shunts: No atrial level shunt detected by color flow Doppler.  LEFT VENTRICLE PLAX 2D LVIDd:         6.10 cm   Diastology LVIDs:         4.30 cm   LV e' medial:    9.81 cm/s LV PW:         1.40 cm   LV E/e' medial:  10.5 LV IVS:        1.20 cm   LV e' lateral:   9.24 cm/s LVOT diam:     2.20 cm   LV E/e' lateral: 11.1 LV SV:         81 LV SV Index:   36 LVOT Area:     3.80 cm  RIGHT VENTRICLE             IVC RV Basal diam:  4.30 cm     IVC diam: 2.10 cm RV Mid diam:    3.80 cm RV S prime:     16.00 cm/s TAPSE (M-mode): 3.2  cm LEFT ATRIUM             Index        RIGHT ATRIUM           Index  LA diam:        5.20 cm 2.34 cm/m   RA Area:     24.50 cm LA Vol (A2C):   50.8 ml 22.83 ml/m  RA Volume:   85.60 ml  38.47 ml/m LA Vol (A4C):   72.1 ml 32.41 ml/m LA Biplane Vol: 60.8 ml 27.33 ml/m  AORTIC VALVE LVOT Vmax:   102.00 cm/s LVOT Vmean:  64.800 cm/s LVOT VTI:    0.213 m  AORTA Ao Root diam: 3.80 cm Ao Asc diam:  3.60 cm MITRAL VALVE MV Area (PHT): 5.97 cm     SHUNTS MV Decel Time: 127 msec     Systemic VTI:  0.21 m MV E velocity: 103.00 cm/s  Systemic Diam: 2.20 cm MV A velocity: 107.00 cm/s MV E/A ratio:  0.96 [name] MD Electronically signed by [name] MD Signature Date/Time: 05/31/2023/12:19:56 PM    Final    DG Chest 2 View Result Date: 05/30/2023 CLINICAL DATA:  Acute renal injury EXAM: CHEST - 2 VIEW COMPARISON:  None Available. FINDINGS: Normal lung volumes. No focal consolidations. Blunting of the left costophrenic angle with thickening of the left lateral pleura. No pneumothorax. Mildly enlarged cardiomediastinal silhouette. No acute osseous abnormality. IMPRESSION: 1. Mild cardiomegaly. 2. Blunting of the left costophrenic angle, which may represent pleural thickening. Electronically Signed   By: Agustin Cree M.D.   On: 05/30/2023 16:57   CT ABDOMEN PELVIS WO CONTRAST Result Date: 05/30/2023 CLINICAL DATA:  unilateral significant hydro on [place], creat 4.5 new diagnosis renal failure, eval for cause of hydro. EXAM: CT ABDOMEN AND PELVIS WITHOUT CONTRAST TECHNIQUE: Multidetector CT imaging of the abdomen and pelvis was performed following the standard protocol without IV contrast. RADIATION DOSE REDUCTION: This exam was performed according to the departmental dose-optimization program which includes automated exposure control, adjustment of the mA and/or kV according to patient size and/or use of iterative reconstruction technique. COMPARISON:  CT scan urogram report from 09/12/2014. Please note images are  not available for review at the time of dictation. FINDINGS: Lower chest: There are atelectatic changes at the left lung base. The lung bases are otherwise clear. No pleural effusion. The heart is normal in size. No pericardial effusion. Hepatobiliary: The liver is normal in size. There is subtle irregularity/nodularity of the liver surface, raising the concern for liver parenchymal disease. Correlate clinically. No suspicious mass. No intrahepatic or extrahepatic bile duct dilation. There are small volume calcified gallstones, without imaging signs of acute cholecystitis. Normal gallbladder wall thickness. No pericholecystic inflammatory changes. Pancreas: Unremarkable. No pancreatic ductal dilatation or surrounding inflammatory changes. Spleen: Within normal limits. No focal lesion. Adrenals/Urinary Tract: Adrenal glands are unremarkable. There is a 5 mm nonobstructing calculus in the right kidney lower pole. There is mild right hydroureter however, no discrete obstructing mass ureteric calculus seen. No right hydronephrosis. Mild right perinephric fat stranding, nonspecific. There are several calculi in the left kidney with largest branching calculus in the interpolar region measuring up to 1.3 x 1.7 cm. There is moderate-to-severe left hydronephrosis with moderate thinning of the left renal cortex. However, left ureter is nondilated. The above-mentioned calculus in the interpolar region may represent portion of staghorn calculus with probable associated renal UPJ stricture. However, examination is limited due to lack of intravenous contrast. There is mild left perinephric fat stranding as well. Urinary bladder is partially distended precluding optimal assessment however, no urinary bladder mass or calculi seen. No significant bladder wall thickening or perivesical fat stranding. Stomach/Bowel: No disproportionate dilation  of the small or large bowel loops. No evidence of abnormal bowel wall thickening or  inflammatory changes. The appendix is unremarkable. There are scattered diverticula throughout the colon, without imaging signs of diverticulitis. Vascular/Lymphatic: No ascites or pneumoperitoneum. No abdominal or pelvic lymphadenopathy, by size criteria. No aneurysmal dilation of the major abdominal arteries. There are mild peripheral atherosclerotic vascular calcifications of the aorta and its major branches. Reproductive: Enlarged prostate. Symmetric seminal vesicles. Other: There is a tiny fat containing umbilical hernia. There is mild anasarca. Musculoskeletal: No suspicious osseous lesions. There are mild - moderate multilevel degenerative changes in the visualized spine. IMPRESSION: 1. There are several calculi in the left renal collecting system with largest branching calculus in the interpolar region measuring up to 1.3 x 1.7 cm, favored to represent portion of staghorn calculus. There is moderate to severe left hydronephrosis with moderate left renal cortical thinning; however, left ureter is nondilated. No left ureterolithiasis. 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 Consider contrast enhanced/delayed images for further evaluation. 2. There are additional left renal calculi and a 5 mm nonobstructing calculus in the right kidney lower pole. No right hydroureteronephrosis or ureterolithiasis. 3. Multiple other nonacute observations (such as subtle liver surface irregularity/nodularity, cholelithiasis without acute cholecystitis, scattered colonic diverticulosis, etc.), As described above. Electronically Signed   By: Jules Schick M.D.   On: 05/30/2023 16:23   US RENAL Result Date: 05/30/2023 CLINICAL DATA:  Acute kidney injury. EXAM: RENAL / URINARY TRACT ULTRASOUND COMPLETE COMPARISON:  Report of renal ultrasound 06/08/2015. Images are not currently available. FINDINGS: Right Kidney: Renal measurements: 11.1 x 5.1 x 7.6 cm = volume:  228 mL. Echogenicity within normal limits. No mass or hydronephrosis visualized. Left Kidney: Renal measurements: 12.8 x 7.6 x 7.6 cm = volume: 247 mL. Moderate hydronephrosis is present. Renal parenchyma is within normal limits. Bladder: Appears normal for degree of bladder distention. Other: None. IMPRESSION: 1. Moderate left hydronephrosis. 2. Normal sonographic appearance of the right kidney. Electronically Signed   By: Marin Roberts M.D.   On: 05/30/2023 12:51     ASSESSMENT/PLAN:  Assessment: Principal Problem:   Acute kidney injury superimposed on chronic kidney disease (HCC)   Plan:  AKI superimposed on CKD NAGMA Proteinuria Staghorn calculus (L)- non-obstructing Hx of bilateral staghorn calculi s/p surgery mod to severe hydronephrosis on the left  Nephrolithiasis  Appreciate nephrology. US showing hydronephrosis on the left. Multiple stones but pt is asymptomatic. Seems like his Cr values have been increasing for several years. Will follow up with Nephro outpatient. Urology consult pending. BLE edema continues, patient urinating with lasix 80 TID. Echo showing normal EF with no regional wall motion abnormalities and moderate LVH. Edema could be related to worsening renal function. Started ceftriaxone today but cultures seem to be <100,000 colonies and he is asymptomatic. Will hold off more abx tomorrow. HIV negative. -Appreciate Nephrology - Urology recs pending  -Proteinuria studies pending    Elevated PSA -Pending urology recs   Iron Deficiency Anemia Hgb 8.5 today and stable. CTM cw iron supplementation. May most likely need colonoscopy if he has not gotten one yet. I do not see one in his records.   HTN Hypertensive today. Will hold home Olmesartan Medoxomil in setting of AKI. On Amlodipine 5mg  since yesterday. CTM.   HLD Last lipid panel noted on 07/19/21 WNL.  -Continue Rosuvastatin.   TII DM Glucose 77 this AM.  -CTM - SSI  -hypoglycemia precautions  A Fib On Eliquis at home. Continue home Eliquis 5 mg BID.  Best Practice: Diet: Cardiac diet Eliquis Code: Full DISPO: Anticipated discharge tomorrow to Home pending  medical work-up .  Signature: [facility]  Internal Medicine Resident, PGY-1 [facility]  Pager: [phone] 4:26 PM, 05/31/2023   Please contact the on call pager after 5 pm and on weekends at [phone].

## 2023-06-01 DIAGNOSIS — N189 Chronic kidney disease, unspecified: Secondary | ICD-10-CM | POA: Diagnosis not present

## 2023-06-01 DIAGNOSIS — N200 Calculus of kidney: Secondary | ICD-10-CM | POA: Diagnosis not present

## 2023-06-01 DIAGNOSIS — N179 Acute kidney failure, unspecified: Secondary | ICD-10-CM | POA: Diagnosis not present

## 2023-06-01 LAB — GLOMERULAR BASEMENT MEMBRANE ANTIBODIES: GBM Ab: <0.2 U (ref 0.0–0.9)

## 2023-06-01 LAB — MAGNESIUM: Magnesium: 1.9 mg/dL (ref 1.7–2.4)

## 2023-06-01 LAB — KAPPA/LAMBDA LIGHT CHAINS
Kappa free light chain: 102.1 mg/L — ABNORMAL HIGH (ref 3.3–19.4)
Kappa, lambda light chain ratio: 1.76 — ABNORMAL HIGH (ref 0.26–1.65)
Lambda free light chains: 58.1 mg/L — ABNORMAL HIGH (ref 5.7–26.3)

## 2023-06-01 LAB — CBC
HCT: 26.3 % — ABNORMAL LOW (ref 39.0–52.0)
Hemoglobin: 8.2 g/dL — ABNORMAL LOW (ref 13.0–17.0)
MCH: 26.8 pg (ref 26.0–34.0)
MCHC: 31.2 g/dL (ref 30.0–36.0)
MCV: 85.9 fL (ref 80.0–100.0)
Platelets: 238 10*3/uL (ref 150–400)
RBC: 3.06 MIL/uL — ABNORMAL LOW (ref 4.22–5.81)
RDW: 14 % (ref 11.5–15.5)
WBC: 10.5 10*3/uL (ref 4.0–10.5)
nRBC: 0 % (ref 0.0–0.2)

## 2023-06-01 LAB — RENAL FUNCTION PANEL
Albumin: 2.7 g/dL — ABNORMAL LOW (ref 3.5–5.0)
Anion gap: 12 (ref 5–15)
BUN: 52 mg/dL — ABNORMAL HIGH (ref 8–23)
CO2: 18 mmol/L — ABNORMAL LOW (ref 22–32)
Calcium: 8.7 mg/dL — ABNORMAL LOW (ref 8.9–10.3)
Chloride: 107 mmol/L (ref 98–111)
Creatinine, Ser: 4.71 mg/dL — ABNORMAL HIGH (ref 0.61–1.24)
GFR, Estimated: 13 mL/min — ABNORMAL LOW (ref >60)
Glucose, Bld: 121 mg/dL — ABNORMAL HIGH (ref 70–99)
Phosphorus: 5.9 mg/dL — ABNORMAL HIGH (ref 2.5–4.6)
Potassium: 3.3 mmol/L — ABNORMAL LOW (ref 3.5–5.1)
Sodium: 137 mmol/L (ref 135–145)

## 2023-06-01 MED ORDER — POTASSIUM CHLORIDE CRYS ER 20 MEQ PO TBCR
40.0000 meq | EXTENDED_RELEASE_TABLET | Freq: Two times a day (BID) | ORAL | Status: AC
  Administered 2023-06-01 (×2): 40 meq via ORAL
  Filled 2023-06-01 (×2): qty 2

## 2023-06-01 MED ORDER — FUROSEMIDE 10 MG/ML IJ SOLN
80.0000 mg | Freq: Every day | INTRAMUSCULAR | Status: DC
  Administered 2023-06-02: 80 mg via INTRAVENOUS
  Filled 2023-06-01: qty 8

## 2023-06-01 NOTE — Progress Notes (Signed)
Subjective: NAEON.  Reviewed the natural history of this kidney stone development and available interventions.  He is amenable to being n.p.o. at midnight and reevaluating surgical intervention in the morning.  Objective: Vital signs in last 24 hours: Temp:  [97.9 F (36.6 C)-98 F (36.7 C)] 98 F (36.7 C) (01/27 0804) Pulse Rate:  [60-88] 60 (01/27 0804) Resp:  [16-19] 19 (01/27 0804) BP: (133-148)/(61-91) 133/61 (01/27 0804) SpO2:  [96 %-100 %] 99 % (01/27 0804) Weight:  [103.3 kg] 103.3 kg (01/27 0500)  Assessment/Plan: #AoCKD # Staghorn stone # Hydronephrosis  CT A/P collected 05/30/2023 shows 13 x 17 mm left UPJ stone with associated hydronephrosis and moderate cortical thinning.  N.p.o. at midnight  Hold Eliquis if possible  In the a.m. will consider and left percutaneous nephrostomy tube with IR and outpatient percutaneous nephrolithotomy.  Intake/Output from previous day: 01/26 0701 - 01/27 0700 In: 1060 [P.O.:960; IV Piggyback:100] Out: -   Intake/Output this shift: Total I/O In: -  Out: 600 [Urine:600]  Physical Exam:  General: Alert and oriented CV: No cyanosis Lungs: equal chest rise  Lab Results: Recent Labs    05/30/23 0856 05/31/23 0542 06/01/23 0512  HGB 8.0* 8.5* 8.2*  HCT 26.4* 28.2* 26.3*   BMET Recent Labs    05/31/23 0542 06/01/23 0512  NA 140 137  K 3.5 3.3*  CL 111 107  CO2 16* 18*  GLUCOSE 77 121*  BUN 53* 52*  CREATININE 4.43* 4.71*  CALCIUM 9.2 8.7*     Studies/Results: ECHOCARDIOGRAM COMPLETE Result Date: 05/31/2023    ECHOCARDIOGRAM REPORT   Patient Name:   Calvin Schultz Date of Exam: 05/31/2023 Medical Rec #:  [id number]   Height:       69.0 in Accession #:    [id number]  Weight:       238.1 lb Date of Birth:  1950/09/01  BSA:          2.225 m Patient Age:    72 years    BP:           162/97 mmHg Patient Gender: M           HR:           76 bpm. Exam Location:  Inpatient Procedure: 2D Echo, Color Doppler and Cardiac  Doppler Indications:    Lower extremity edema  History:        Patient has no prior history of Echocardiogram examinations.                 Chronic Kidney Disease.  Sonographer:    Calvin Schultz Sonographer#2:  Calvin Schultz RDCS Referring Phys: [id number] Calvin Schultz IMPRESSIONS  1. Left ventricular ejection fraction, by estimation, is 60 to 65%. The left ventricle has normal function. The left ventricle has no regional wall motion abnormalities. The left ventricular internal cavity size was moderately dilated. There is moderate  left ventricular hypertrophy. Left ventricular diastolic parameters were normal.  2. Right ventricular systolic function is normal. The right ventricular size is mildly enlarged.  3. Left atrial size was mildly dilated.  4. Right atrial size was mildly dilated.  5. The mitral valve is normal in structure. Mild mitral valve regurgitation. No evidence of mitral stenosis.  6. The aortic valve is tricuspid. Aortic valve regurgitation is not visualized. Aortic valve sclerosis is present, with no evidence of aortic valve stenosis.  7. Aortic dilatation noted. There is borderline dilatation of the aortic root, measuring 38 mm.  8. The inferior vena cava is normal in size with greater than 50% respiratory variability, suggesting right atrial pressure of 3 mmHg. Comparison(s): No prior Echocardiogram. FINDINGS  Left Ventricle: Left ventricular ejection fraction, by estimation, is 60 to 65%. The left ventricle has normal function. The left ventricle has no regional wall motion abnormalities. The left ventricular internal cavity size was moderately dilated. There is moderate left ventricular hypertrophy. Left ventricular diastolic parameters were normal. Right Ventricle: The right ventricular size is mildly enlarged. Right ventricular systolic function is normal. Left Atrium: Left atrial size was mildly dilated. Right Atrium: Right atrial size was mildly dilated. Pericardium: There is no  evidence of pericardial effusion. Mitral Valve: The mitral valve is normal in structure. Mild mitral annular calcification. Mild mitral valve regurgitation. No evidence of mitral valve stenosis. Tricuspid Valve: The tricuspid valve is normal in structure. Tricuspid valve regurgitation is trivial. No evidence of tricuspid stenosis. Aortic Valve: The aortic valve is tricuspid. Aortic valve regurgitation is not visualized. Aortic valve sclerosis is present, with no evidence of aortic valve stenosis. Pulmonic Valve: The pulmonic valve was normal in structure. Pulmonic valve regurgitation is not visualized. No evidence of pulmonic stenosis. Aorta: Aortic dilatation noted. There is borderline dilatation of the aortic root, measuring 38 mm. Venous: The inferior vena cava is normal in size with greater than 50% respiratory variability, suggesting right atrial pressure of 3 mmHg. IAS/Shunts: No atrial level shunt detected by color flow Doppler.  LEFT VENTRICLE PLAX 2D LVIDd:         6.10 cm   Diastology LVIDs:         4.30 cm   LV e' medial:    9.81 cm/s LV PW:         1.40 cm   LV E/e' medial:  10.5 LV IVS:        1.20 cm   LV e' lateral:   9.24 cm/s LVOT diam:     2.20 cm   LV E/e' lateral: 11.1 LV SV:         81 LV SV Index:   36 LVOT Area:     3.80 cm  RIGHT VENTRICLE             IVC RV Basal diam:  4.30 cm     IVC diam: 2.10 cm RV Mid diam:    3.80 cm RV S prime:     16.00 cm/s TAPSE (M-mode): 3.2 cm LEFT ATRIUM             Index        RIGHT ATRIUM           Index LA diam:        5.20 cm 2.34 cm/m   RA Area:     24.50 cm LA Vol (A2C):   50.8 ml 22.83 ml/m  RA Volume:   85.60 ml  38.47 ml/m LA Vol (A4C):   72.1 ml 32.41 ml/m LA Biplane Vol: 60.8 ml 27.33 ml/m  AORTIC VALVE LVOT Vmax:   102.00 cm/s LVOT Vmean:  64.800 cm/s LVOT VTI:    0.213 m  AORTA Ao Root diam: 3.80 cm Ao Asc diam:  3.60 cm MITRAL VALVE MV Area (PHT): 5.97 cm     SHUNTS MV Decel Time: 127 msec     Systemic VTI:  0.21 m MV E velocity: 103.00  cm/s  Systemic Diam: 2.20 cm MV A velocity: 107.00 cm/s MV E/A ratio:  0.96 Calvin Millers MD Electronically signed by Calvin John  Crenshaw MD Signature Date/Time: 05/31/2023/12:19:56 PM    Final    DG Chest 2 View Result Date: 05/30/2023 CLINICAL DATA:  Acute renal injury EXAM: CHEST - 2 VIEW COMPARISON:  None Available. FINDINGS: Normal lung volumes. No focal consolidations. Blunting of the left costophrenic angle with thickening of the left lateral pleura. No pneumothorax. Mildly enlarged cardiomediastinal silhouette. No acute osseous abnormality. IMPRESSION: 1. Mild cardiomegaly. 2. Blunting of the left costophrenic angle, which may represent pleural thickening. Electronically Signed   By: Agustin Cree M.D.   On: 05/30/2023 16:57   CT ABDOMEN PELVIS WO CONTRAST Result Date: 05/30/2023 CLINICAL DATA:  unilateral significant hydro on [place], creat 4.5 new diagnosis renal failure, eval for cause of hydro. EXAM: CT ABDOMEN AND PELVIS WITHOUT CONTRAST TECHNIQUE: Multidetector CT imaging of the abdomen and pelvis was performed following the standard protocol without IV contrast. RADIATION DOSE REDUCTION: This exam was performed according to the departmental dose-optimization program which includes automated exposure control, adjustment of the mA and/or kV according to patient size and/or use of iterative reconstruction technique. COMPARISON:  CT scan urogram report from 09/12/2014. Please note images are not available for review at the time of dictation. FINDINGS: Lower chest: There are atelectatic changes at the left lung base. The lung bases are otherwise clear. No pleural effusion. The heart is normal in size. No pericardial effusion. Hepatobiliary: The liver is normal in size. There is subtle irregularity/nodularity of the liver surface, raising the concern for liver parenchymal disease. Correlate clinically. No suspicious mass. No intrahepatic or extrahepatic bile duct dilation. There are small volume calcified  gallstones, without imaging signs of acute cholecystitis. Normal gallbladder wall thickness. No pericholecystic inflammatory changes. Pancreas: Unremarkable. No pancreatic ductal dilatation or surrounding inflammatory changes. Spleen: Within normal limits. No focal lesion. Adrenals/Urinary Tract: Adrenal glands are unremarkable. There is a 5 mm nonobstructing calculus in the right kidney lower pole. There is mild right hydroureter however, no discrete obstructing mass ureteric calculus seen. No right hydronephrosis. Mild right perinephric fat stranding, nonspecific. There are several calculi in the left kidney with largest branching calculus in the interpolar region measuring up to 1.3 x 1.7 cm. There is moderate-to-severe left hydronephrosis with moderate thinning of the left renal cortex. However, left ureter is nondilated. The above-mentioned calculus in the interpolar region may represent portion of staghorn calculus with probable associated renal UPJ stricture. However, examination is limited due to lack of intravenous contrast. There is mild left perinephric fat stranding as well. Urinary bladder is partially distended precluding optimal assessment however, no urinary bladder mass or calculi seen. No significant bladder wall thickening or perivesical fat stranding. Stomach/Bowel: No disproportionate dilation of the small or large bowel loops. No evidence of abnormal bowel wall thickening or inflammatory changes. The appendix is unremarkable. There are scattered diverticula throughout the colon, without imaging signs of diverticulitis. Vascular/Lymphatic: No ascites or pneumoperitoneum. No abdominal or pelvic lymphadenopathy, by size criteria. No aneurysmal dilation of the major abdominal arteries. There are mild peripheral atherosclerotic vascular calcifications of the aorta and its major branches. Reproductive: Enlarged prostate. Symmetric seminal vesicles. Other: There is a tiny fat containing umbilical  hernia. There is mild anasarca. Musculoskeletal: No suspicious osseous lesions. There are mild - moderate multilevel degenerative changes in the visualized spine. IMPRESSION: 1. There are several calculi in the left renal collecting system with largest branching calculus in the interpolar region measuring up to 1.3 x 1.7 cm, favored to represent portion of staghorn calculus. There is moderate to severe left  hydronephrosis with moderate left renal cortical thinning; however, left ureter is nondilated. No left ureterolithiasis. 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 Consider contrast enhanced/delayed images for further evaluation. 2. There are additional left renal calculi and a 5 mm nonobstructing calculus in the right kidney lower pole. No right hydroureteronephrosis or ureterolithiasis. 3. Multiple other nonacute observations (such as subtle liver surface irregularity/nodularity, cholelithiasis without acute cholecystitis, scattered colonic diverticulosis, etc.), As described above. Electronically Signed   By: Jules Schick M.D.   On: 05/30/2023 16:23      LOS: 1 day   Elmon Kirschner, NP [facility] Pager: [phone]  06/01/2023, 1:56 PM

## 2023-06-01 NOTE — Plan of Care (Signed)
Problem: Education: Goal: Knowledge of General Education information will improve Description: Including pain rating scale, medication(s)/side effects and non-pharmacologic comfort measures Outcome: Progressing   Problem: Activity: Goal: Risk for activity intolerance will decrease Outcome: Progressing   Problem: Coping: Goal: Level of anxiety will decrease Outcome: Progressing   Problem: Elimination: Goal: Will not experience complications related to bowel motility Outcome: Progressing

## 2023-06-01 NOTE — Plan of Care (Addendum)
Pt is alert and oriented x 4. Up adlib to bathroom with cane. Wife at bedside. Vitals stable. No prns required.  Problem: Education: Goal: Knowledge of General Education information will improve Description: Including pain rating scale, medication(s)/side effects and non-pharmacologic comfort measures Outcome: Progressing   Problem: Health Behavior/Discharge Planning: Goal: Ability to manage health-related needs will improve Outcome: Progressing   Problem: Clinical Measurements: Goal: Ability to maintain clinical measurements within normal limits will improve Outcome: Progressing Goal: Will remain free from infection Outcome: Progressing Goal: Diagnostic test results will improve Outcome: Progressing Goal: Respiratory complications will improve Outcome: Progressing Goal: Cardiovascular complication will be avoided Outcome: Progressing   Problem: Activity: Goal: Risk for activity intolerance will decrease Outcome: Progressing   Problem: Nutrition: Goal: Adequate nutrition will be maintained Outcome: Progressing   Problem: Coping: Goal: Level of anxiety will decrease Outcome: Progressing   Problem: Elimination: Goal: Will not experience complications related to bowel motility Outcome: Progressing Goal: Will not experience complications related to urinary retention Outcome: Progressing   Problem: Pain Managment: Goal: General experience of comfort will improve and/or be controlled Outcome: Progressing   Problem: Safety: Goal: Ability to remain free from injury will improve Outcome: Progressing   Problem: Skin Integrity: Goal: Risk for impaired skin integrity will decrease Outcome: Progressing   Problem: Education: Goal: Knowledge of disease and its progression will improve Outcome: Progressing   Problem: Fluid Volume: Goal: Compliance with measures to maintain balanced fluid volume will improve Outcome: Progressing   Problem: Health Behavior/Discharge  Planning: Goal: Ability to manage health-related needs will improve Outcome: Progressing   Problem: Nutritional: Goal: Ability to make healthy dietary choices will improve Outcome: Progressing   Problem: Clinical Measurements: Goal: Complications related to the disease process, condition or treatment will be avoided or minimized Outcome: Progressing

## 2023-06-01 NOTE — Progress Notes (Signed)
Patient ID: Calvin Schultz, male   DOB: 10-12-1950, 73 y.o.   MRN: [id number] S: No new complaints but wants to go home today. O:BP 133/61 (BP Location: Left Arm)   Pulse 60   Temp 98 F (36.7 C) (Oral)   Resp 19   Ht 5\' 9"  (1.753 m)   Wt 103.3 kg   SpO2 99%   BMI 33.63 kg/m   Intake/Output Summary (Last 24 hours) at 06/01/2023 1222 Last data filed at 06/01/2023 1024 Gross per 24 hour  Intake 820 ml  Output 600 ml  Net 220 ml   Intake/Output: I/O last 3 completed shifts: In: 1060 [P.O.:960; IV Piggyback:100] Out: -   Intake/Output this shift:  Total I/O In: -  Out: 600 [Urine:600] Weight change: -1.027 kg Gen: NAD CVS: RRR Resp:CTA Abd: +BS, soft, NT/ND Ext: trace to 1+ pretibial edema  Recent Labs  Lab 05/30/23 0856 05/31/23 0542 06/01/23 0512  NA 140 140 137  K 3.8 3.5 3.3*  CL 111 111 107  CO2 15* 16* 18*  GLUCOSE 128* 77 121*  BUN 55* 53* 52*  CREATININE 4.46* 4.43* 4.71*  ALBUMIN 2.9*  --  2.7*  CALCIUM 8.8* 9.2 8.7*  PHOS 5.8*  --  5.9*  AST 14*  --   --   ALT 16  --   --    Liver Function Tests: Recent Labs  Lab 05/30/23 0856 06/01/23 0512  AST 14*  --   ALT 16  --   ALKPHOS 101  --   BILITOT 0.6  --   PROT 6.1*  --   ALBUMIN 2.9* 2.7*   No results for input(s): "LIPASE", "AMYLASE" in the last 168 hours. No results for input(s): "AMMONIA" in the last 168 hours. CBC: Recent Labs  Lab 05/30/23 0856 05/31/23 0542 06/01/23 0512  WBC 12.0* 12.1* 10.5  NEUTROABS 9.9*  --   --   HGB 8.0* 8.5* 8.2*  HCT 26.4* 28.2* 26.3*  MCV 89.2 89.0 85.9  PLT 221 260 238   Cardiac Enzymes: No results for input(s): "CKTOTAL", "CKMB", "CKMBINDEX", "TROPONINI" in the last 168 hours. CBG: No results for input(s): "GLUCAP" in the last 168 hours.  Iron Studies: No results for input(s): "IRON", "TIBC", "TRANSFERRIN", "FERRITIN" in the last 72 hours. Studies/Results: ECHOCARDIOGRAM COMPLETE Result Date: 05/31/2023    ECHOCARDIOGRAM REPORT   Patient Name:    Calvin Schultz Date of Exam: 05/31/2023 Medical Rec #:  [id number]   Height:       69.0 in Accession #:    [id number]  Weight:       238.1 lb Date of Birth:  08/20/1950  BSA:          2.225 m Patient Age:    72 years    BP:           162/97 mmHg Patient Gender: M           HR:           76 bpm. Exam Location:  Inpatient Procedure: 2D Echo, Color Doppler and Cardiac Doppler Indications:    Lower extremity edema  History:        Patient has no prior history of Echocardiogram examinations.                 Chronic Kidney Disease.  Sonographer:    Rosaland Lao Sonographer#2:  Delcie Roch RDCS Referring Phys: [id number] JULIE ANNE WILLIAMS IMPRESSIONS  1. Left ventricular ejection fraction, by estimation, is 60 to 65%. The  left ventricle has normal function. The left ventricle has no regional wall motion abnormalities. The left ventricular internal cavity size was moderately dilated. There is moderate  left ventricular hypertrophy. Left ventricular diastolic parameters were normal.  2. Right ventricular systolic function is normal. The right ventricular size is mildly enlarged.  3. Left atrial size was mildly dilated.  4. Right atrial size was mildly dilated.  5. The mitral valve is normal in structure. Mild mitral valve regurgitation. No evidence of mitral stenosis.  6. The aortic valve is tricuspid. Aortic valve regurgitation is not visualized. Aortic valve sclerosis is present, with no evidence of aortic valve stenosis.  7. Aortic dilatation noted. There is borderline dilatation of the aortic root, measuring 38 mm.  8. The inferior vena cava is normal in size with greater than 50% respiratory variability, suggesting right atrial pressure of 3 mmHg. Comparison(s): No prior Echocardiogram. FINDINGS  Left Ventricle: Left ventricular ejection fraction, by estimation, is 60 to 65%. The left ventricle has normal function. The left ventricle has no regional wall motion abnormalities. The left ventricular internal cavity size  was moderately dilated. There is moderate left ventricular hypertrophy. Left ventricular diastolic parameters were normal. Right Ventricle: The right ventricular size is mildly enlarged. Right ventricular systolic function is normal. Left Atrium: Left atrial size was mildly dilated. Right Atrium: Right atrial size was mildly dilated. Pericardium: There is no evidence of pericardial effusion. Mitral Valve: The mitral valve is normal in structure. Mild mitral annular calcification. Mild mitral valve regurgitation. No evidence of mitral valve stenosis. Tricuspid Valve: The tricuspid valve is normal in structure. Tricuspid valve regurgitation is trivial. No evidence of tricuspid stenosis. Aortic Valve: The aortic valve is tricuspid. Aortic valve regurgitation is not visualized. Aortic valve sclerosis is present, with no evidence of aortic valve stenosis. Pulmonic Valve: The pulmonic valve was normal in structure. Pulmonic valve regurgitation is not visualized. No evidence of pulmonic stenosis. Aorta: Aortic dilatation noted. There is borderline dilatation of the aortic root, measuring 38 mm. Venous: The inferior vena cava is normal in size with greater than 50% respiratory variability, suggesting right atrial pressure of 3 mmHg. IAS/Shunts: No atrial level shunt detected by color flow Doppler.  LEFT VENTRICLE PLAX 2D LVIDd:         6.10 cm   Diastology LVIDs:         4.30 cm   LV e' medial:    9.81 cm/s LV PW:         1.40 cm   LV E/e' medial:  10.5 LV IVS:        1.20 cm   LV e' lateral:   9.24 cm/s LVOT diam:     2.20 cm   LV E/e' lateral: 11.1 LV SV:         81 LV SV Index:   36 LVOT Area:     3.80 cm  RIGHT VENTRICLE             IVC RV Basal diam:  4.30 cm     IVC diam: 2.10 cm RV Mid diam:    3.80 cm RV S prime:     16.00 cm/s TAPSE (M-mode): 3.2 cm LEFT ATRIUM             Index        RIGHT ATRIUM           Index LA diam:        5.20 cm 2.34 cm/m   RA Area:  24.50 cm LA Vol (A2C):   50.8 ml 22.83 ml/m  RA  Volume:   85.60 ml  38.47 ml/m LA Vol (A4C):   72.1 ml 32.41 ml/m LA Biplane Vol: 60.8 ml 27.33 ml/m  AORTIC VALVE LVOT Vmax:   102.00 cm/s LVOT Vmean:  64.800 cm/s LVOT VTI:    0.213 m  AORTA Ao Root diam: 3.80 cm Ao Asc diam:  3.60 cm MITRAL VALVE MV Area (PHT): 5.97 cm     SHUNTS MV Decel Time: 127 msec     Systemic VTI:  0.21 m MV E velocity: 103.00 cm/s  Systemic Diam: 2.20 cm MV A velocity: 107.00 cm/s MV E/A ratio:  0.96 [name] MD Electronically signed by [name] MD Signature Date/Time: 05/31/2023/12:19:56 PM    Final    DG Chest 2 View Result Date: 05/30/2023 CLINICAL DATA:  Acute renal injury EXAM: CHEST - 2 VIEW COMPARISON:  None Available. FINDINGS: Normal lung volumes. No focal consolidations. Blunting of the left costophrenic angle with thickening of the left lateral pleura. No pneumothorax. Mildly enlarged cardiomediastinal silhouette. No acute osseous abnormality. IMPRESSION: 1. Mild cardiomegaly. 2. Blunting of the left costophrenic angle, which may represent pleural thickening. Electronically Signed   By: Agustin Cree M.D.   On: 05/30/2023 16:57   CT ABDOMEN PELVIS WO CONTRAST Result Date: 05/30/2023 CLINICAL DATA:  unilateral significant hydro on [place], creat 4.5 new diagnosis renal failure, eval for cause of hydro. EXAM: CT ABDOMEN AND PELVIS WITHOUT CONTRAST TECHNIQUE: Multidetector CT imaging of the abdomen and pelvis was performed following the standard protocol without IV contrast. RADIATION DOSE REDUCTION: This exam was performed according to the departmental dose-optimization program which includes automated exposure control, adjustment of the mA and/or kV according to patient size and/or use of iterative reconstruction technique. COMPARISON:  CT scan urogram report from 09/12/2014. Please note images are not available for review at the time of dictation. FINDINGS: Lower chest: There are atelectatic changes at the left lung base. The lung bases are otherwise clear. No  pleural effusion. The heart is normal in size. No pericardial effusion. Hepatobiliary: The liver is normal in size. There is subtle irregularity/nodularity of the liver surface, raising the concern for liver parenchymal disease. Correlate clinically. No suspicious mass. No intrahepatic or extrahepatic bile duct dilation. There are small volume calcified gallstones, without imaging signs of acute cholecystitis. Normal gallbladder wall thickness. No pericholecystic inflammatory changes. Pancreas: Unremarkable. No pancreatic ductal dilatation or surrounding inflammatory changes. Spleen: Within normal limits. No focal lesion. Adrenals/Urinary Tract: Adrenal glands are unremarkable. There is a 5 mm nonobstructing calculus in the right kidney lower pole. There is mild right hydroureter however, no discrete obstructing mass ureteric calculus seen. No right hydronephrosis. Mild right perinephric fat stranding, nonspecific. There are several calculi in the left kidney with largest branching calculus in the interpolar region measuring up to 1.3 x 1.7 cm. There is moderate-to-severe left hydronephrosis with moderate thinning of the left renal cortex. However, left ureter is nondilated. The above-mentioned calculus in the interpolar region may represent portion of staghorn calculus with probable associated renal UPJ stricture. However, examination is limited due to lack of intravenous contrast. There is mild left perinephric fat stranding as well. Urinary bladder is partially distended precluding optimal assessment however, no urinary bladder mass or calculi seen. No significant bladder wall thickening or perivesical fat stranding. Stomach/Bowel: No disproportionate dilation of the small or large bowel loops. No evidence of abnormal bowel wall thickening or inflammatory changes. The appendix is  unremarkable. There are scattered diverticula throughout the colon, without imaging signs of diverticulitis. Vascular/Lymphatic: No  ascites or pneumoperitoneum. No abdominal or pelvic lymphadenopathy, by size criteria. No aneurysmal dilation of the major abdominal arteries. There are mild peripheral atherosclerotic vascular calcifications of the aorta and its major branches. Reproductive: Enlarged prostate. Symmetric seminal vesicles. Other: There is a tiny fat containing umbilical hernia. There is mild anasarca. Musculoskeletal: No suspicious osseous lesions. There are mild - moderate multilevel degenerative changes in the visualized spine. IMPRESSION: 1. There are several calculi in the left renal collecting system with largest branching calculus in the interpolar region measuring up to 1.3 x 1.7 cm, favored to represent portion of staghorn calculus. There is moderate to severe left hydronephrosis with moderate left renal cortical thinning; however, left ureter is nondilated. No left ureterolithiasis. 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 Consider contrast enhanced/delayed images for further evaluation. 2. There are additional left renal calculi and a 5 mm nonobstructing calculus in the right kidney lower pole. No right hydroureteronephrosis or ureterolithiasis. 3. Multiple other nonacute observations (such as subtle liver surface irregularity/nodularity, cholelithiasis without acute cholecystitis, scattered colonic diverticulosis, etc.), As described above. Electronically Signed   By: Jules Schick M.D.   On: 05/30/2023 16:23    amLODipine  5 mg Oral Daily   apixaban  5 mg Oral BID   furosemide  80 mg Intravenous Q8H   potassium chloride  40 mEq Oral BID   rosuvastatin  40 mg Oral Daily   sevelamer carbonate  800 mg Oral TID WC    BMET    Component Value Date/Time   NA 137 06/01/2023 0512   NA 139 06/25/2021 1539   K 3.3 (L) 06/01/2023 0512   CL 107 06/01/2023 0512   CO2 18 (L) 06/01/2023 0512   GLUCOSE 121 (H) 06/01/2023 0512   BUN 52 (H) 06/01/2023 0512    BUN 22 06/25/2021 1539   CREATININE 4.71 (H) 06/01/2023 0512   CALCIUM 8.7 (L) 06/01/2023 0512   GFRNONAA 13 (L) 06/01/2023 0512   CBC    Component Value Date/Time   WBC 10.5 06/01/2023 0512   RBC 3.06 (L) 06/01/2023 0512   HGB 8.2 (L) 06/01/2023 0512   HGB 13.2 06/25/2021 1539   HCT 26.3 (L) 06/01/2023 0512   HCT 41.2 06/25/2021 1539   PLT 238 06/01/2023 0512   PLT 367 06/25/2021 1539   MCV 85.9 06/01/2023 0512   MCV 84 06/25/2021 1539   MCH 26.8 06/01/2023 0512   MCHC 31.2 06/01/2023 0512   RDW 14.0 06/01/2023 0512   RDW 12.6 06/25/2021 1539   LYMPHSABS 0.7 05/30/2023 0856   LYMPHSABS 2.4 06/25/2021 1539   MONOABS 1.1 (H) 05/30/2023 0856   EOSABS 0.2 05/30/2023 0856   EOSABS 0.2 06/25/2021 1539   BASOSABS 0.0 05/30/2023 0856   BASOSABS 0.1 06/25/2021 1539     Assessment/Plan:  AKI/CKD stage IV vs progressive disease - no labs for the past year and a half.  Renal US and CT with left sided hydronephrosis and staghorn calculus.  Scr up slightly but remains asymptomatic.  He is adamant that he is going home today.  HE will need to f/u with Dr. Ronne Binning as an outpatient, and we will see him in our office in the next 2-3 weeks.  Continue with furosemide 80 mg po. Left UPJ obstruction - if Scr continues to climb, would recommend left nephrostomy tube placement. Elevated PSA - f/u with  Dr. Ronne Binning. Iron deficiency anemia - CKD likely contributing. HTN - continue to hold olmesartan in AKI.  Continue with amlodipine. Will sign off.  Please call with questions or concerns.   Irena Cords, MD [employer] [phone]

## 2023-06-01 NOTE — Progress Notes (Signed)
Summary: Calvin Schultz is a 73 y.o. with a PMH of HTN and T2DM and neprholithiasis presenting with concerns of an increased creatinine admitted for an AKI superimposed on CKD.  HD: 2  Subjective:  Patient seen and evaluated while resting in bed. He states he feels fine. Reports swelling is better, no difficulty breathing and urinating well.   Objective:  Vital signs in last 24 hours: Vitals:   06/01/23 0500 06/01/23 0530 06/01/23 0804 06/01/23 1403  BP:  (!) 148/68 133/61 (!) 143/73  Pulse:  88 60 72  Resp:  18 19 18   Temp:  97.9 F (36.6 C) 98 F (36.7 C) 98 F (36.7 C)  TempSrc:   Oral Oral  SpO2:  96% 99% 100%  Weight: 103.3 kg     Height:       Physical Exam Constitutional:      General: He is not in acute distress.    Appearance: He is not ill-appearing or toxic-appearing.  Cardiovascular:     Rate and Rhythm: Normal rate and regular rhythm.  Pulmonary:     Effort: Pulmonary effort is normal.     Breath sounds: Normal breath sounds.  Abdominal:     Palpations: Abdomen is soft.     Tenderness: There is no abdominal tenderness.  Musculoskeletal:     Right lower leg: Edema present.     Left lower leg: Edema present.  Skin:    General: Skin is warm and dry.  Neurological:     Mental Status: He is alert.    Weight change: -1.027 kg  Intake/Output Summary (Last 24 hours) at 06/01/2023 1556 Last data filed at 06/01/2023 1024 Gross per 24 hour  Intake 580 ml  Output 600 ml  Net -20 ml      Latest Ref Rng & Units 06/01/2023    5:12 AM 05/31/2023    5:42 AM 05/30/2023    8:56 AM  CBC  WBC 4.0 - 10.5 K/uL 10.5  12.1  12.0   Hemoglobin 13.0 - 17.0 g/dL 8.2  8.5  8.0   Hematocrit 39.0 - 52.0 % 26.3  28.2  26.4   Platelets 150 - 400 K/uL 238  260  221       Latest Ref Rng & Units 06/01/2023    5:12 AM 05/31/2023    5:42 AM 05/30/2023    8:56 AM  BMP  Glucose 70 - 99 mg/dL 161  77  096   BUN 8 - 23 mg/dL 52  53  55   Creatinine 0.61 - 1.24 mg/dL 0.45  4.09   8.11   Sodium 135 - 145 mmol/L 137  140  140   Potassium 3.5 - 5.1 mmol/L 3.3  3.5  3.8   Chloride 98 - 111 mmol/L 107  111  111   CO2 22 - 32 mmol/L 18  16  15    Calcium 8.9 - 10.3 mg/dL 8.7  9.2  8.8    Albumin: 2.7 Mg: 1.9 Phosphorus: 5.9  Kappa free light chain: 102.1 Lambda free light chains: 58.1 Kappa, lambda light chain ratio: 1.76  Assessment/Plan:  Principal Problem:   Acute kidney injury superimposed on chronic kidney disease (HCC) Active Problems:   Elevated PSA   Nephrolithiasis   Bilateral lower extremity edema   Chronic atrial fibrillation (HCC)   Anemia   Metabolic acidosis   Abnormal urinalysis   Proteinuria  [name] is a 73 y.o. with a PMH of HTN and T2DM and neprholithiasis presenting  with concerns of an increased creatinine admitted for an AKI superimposed on CKD on HD 2.   AKI superimposed on CKD NAGMA Proteinuria Staghorn calculus (L)- non-obstructing Hx of bilateral staghorn calculi s/p surgery mod to severe hydronephrosis on the left  Nephrolithiasis  Nephrology and Urology following, appreciate recommendations. Creatinine increased to 4.71 from 4.43 yesterday. UP/C ratio = 10.15. US showing hydronephrosis on the left. Multiple stones but pt is asymptomatic. CT concerning for L UPJ stricture. Urology initially recommended observation and if creatinine fails to improve, may consider nephrostomy tube placement yesterday. Today, they recommend reevaluating surgical intervention tomorrow AM, so NPO at midnight and holding Eliquis until they decide. Nephrology sending serologies for other causes of proteinuria. BLE edema improving but still present. Echo showing normal EF with no regional wall motion abnormalities and moderate LVH. Edema could be related to worsening renal function. Patient urinating with lasix 80 TID. Reached out to nursing as output was not documented this AM. Per nursing, patient reported he was told not to measure output. Patient and  nursing staff encouraged to measure urine.   - Appreciate Nephrology - Appreciate Urology  - Recommend nephrology outpatient follow-up - Additional serologies for proteinuria pending  - Strict I/O - NPO at midnight  Hypokalemia K+ 3.3 from 3.5 yesterday. Suspect this is 2/2 to lasix. Mg lab resulted WNL (1.9). Will replete K+. - Start Potassium chloride SA 40 mEq BID   Elevated PSA Urology following, recommend repeat PSA in 6 weeks. Appreciate recommendations.  - PSA in 6 weeks outpatient    Iron Deficiency Anemia Hgb 8.2 today and stable. Per chart review, (+) cologuard in 2023. Do not see colonoscopy follow-up. Will likely need one outpatient.    HTN Hypertensive episode overnight with normal BP this AM. Will hold home Olmesartan Medoxomil in setting of AKI. Amlodipine 5mg  started on admission.  - CTM - Continue Amlodipine 5mg    HLD Last lipid panel noted on 07/19/21 WNL.  -Continue Rosuvastatin.   TII DM Glucose 121 this AM.  - CTM - SSI  - Hypoglycemia precautions   A Fib On Eliquis at home. - Hold home Eliquis 5 mg BID until decision about surgery is made   Diet: Cardiac diet VTE: Eliquis (Holding until decision about surgery) Fluids: None Code: Full  Dispo: Anticipate discharge in 1 day.   LOS: 1 day   Bubba Hales, Medical Student 06/01/2023, 3:56 PM

## 2023-06-02 DIAGNOSIS — N189 Chronic kidney disease, unspecified: Secondary | ICD-10-CM | POA: Diagnosis not present

## 2023-06-02 DIAGNOSIS — I482 Chronic atrial fibrillation, unspecified: Secondary | ICD-10-CM

## 2023-06-02 DIAGNOSIS — N179 Acute kidney failure, unspecified: Secondary | ICD-10-CM | POA: Diagnosis not present

## 2023-06-02 DIAGNOSIS — N200 Calculus of kidney: Secondary | ICD-10-CM | POA: Diagnosis not present

## 2023-06-02 LAB — RENAL FUNCTION PANEL
Albumin: 2.8 g/dL — ABNORMAL LOW (ref 3.5–5.0)
Anion gap: 12 (ref 5–15)
BUN: 49 mg/dL — ABNORMAL HIGH (ref 8–23)
CO2: 21 mmol/L — ABNORMAL LOW (ref 22–32)
Calcium: 8.9 mg/dL (ref 8.9–10.3)
Chloride: 106 mmol/L (ref 98–111)
Creatinine, Ser: 4.81 mg/dL — ABNORMAL HIGH (ref 0.61–1.24)
GFR, Estimated: 12 mL/min — ABNORMAL LOW (ref >60)
Glucose, Bld: 108 mg/dL — ABNORMAL HIGH (ref 70–99)
Phosphorus: 5.4 mg/dL — ABNORMAL HIGH (ref 2.5–4.6)
Potassium: 3.6 mmol/L (ref 3.5–5.1)
Sodium: 139 mmol/L (ref 135–145)

## 2023-06-02 LAB — ANCA TITERS
Atypical P-ANCA titer: <1:20 {titer}
C-ANCA: <1:20 {titer}
P-ANCA: <1:20 {titer}

## 2023-06-02 LAB — CBC
HCT: 27.2 % — ABNORMAL LOW (ref 39.0–52.0)
Hemoglobin: 8.8 g/dL — ABNORMAL LOW (ref 13.0–17.0)
MCH: 27.6 pg (ref 26.0–34.0)
MCHC: 32.4 g/dL (ref 30.0–36.0)
MCV: 85.3 fL (ref 80.0–100.0)
Platelets: 250 10*3/uL (ref 150–400)
RBC: 3.19 MIL/uL — ABNORMAL LOW (ref 4.22–5.81)
RDW: 13.9 % (ref 11.5–15.5)
WBC: 10.7 10*3/uL — ABNORMAL HIGH (ref 4.0–10.5)
nRBC: 0 % (ref 0.0–0.2)

## 2023-06-02 LAB — ANTINUCLEAR ANTIBODIES, IFA: ANA Ab, IFA: NEGATIVE

## 2023-06-02 MED ORDER — DARBEPOETIN ALFA 40 MCG/0.4ML IJ SOSY
40.0000 ug | PREFILLED_SYRINGE | INTRAMUSCULAR | Status: DC
  Administered 2023-06-02: 40 ug via SUBCUTANEOUS
  Filled 2023-06-02: qty 0.4

## 2023-06-02 MED ORDER — SODIUM CHLORIDE 0.9 % IV SOLN: 2.0000 g | INTRAVENOUS | Status: AC

## 2023-06-02 MED ORDER — MAGNESIUM SULFATE 2 GM/50ML IV SOLN
2.0000 g | Freq: Once | INTRAVENOUS | Status: AC
  Administered 2023-06-02: 2 g via INTRAVENOUS
  Filled 2023-06-02: qty 50

## 2023-06-02 NOTE — Progress Notes (Addendum)
Summary: Calvin Schultz is a 73 y.o. with a PMH of HTN and T2DM and neprholithiasis presenting with concerns of an increased creatinine admitted for an AKI superimposed on CKD.   HD: 3  Subjective:  Patient seen and evaluated while sitting on the side of hospital bed. Patient reports he is doing well. Reports improvement in leg swelling, breathing ok and currently in no pain. No acute complaints.  Objective:  Vital signs in last 24 hours: Vitals:   06/01/23 2111 06/02/23 0447 06/02/23 0734 06/02/23 0820  BP: (!) 164/76 138/64 (!) 146/88   Pulse: 65 (!) 59 93   Resp: 20 17 18    Temp: 97.8 F (36.6 C) 97.8 F (36.6 C) 98.2 F (36.8 C)   TempSrc: Oral Oral Oral   SpO2: 98% 99% 95%   Weight:    101.6 kg  Height:       Physical Exam Constitutional:      General: He is not in acute distress.    Appearance: He is not ill-appearing or toxic-appearing.  Cardiovascular:     Rate and Rhythm: Normal rate and regular rhythm.  Pulmonary:     Effort: Pulmonary effort is normal.     Breath sounds: Normal breath sounds.  Musculoskeletal:     Right lower leg: Edema present.     Left lower leg: Edema present.     Comments: B/l edema improving  Skin:    General: Skin is warm and dry.  Neurological:     Mental Status: He is alert.  Psychiatric:        Mood and Affect: Mood normal.        Behavior: Behavior normal.    Weight change:   Intake/Output Summary (Last 24 hours) at 06/02/2023 1337 Last data filed at 06/02/2023 1032 Gross per 24 hour  Intake --  Output 4150 ml  Net -4150 ml      Latest Ref Rng & Units 06/02/2023    5:52 AM 06/01/2023    5:12 AM 05/31/2023    5:42 AM  CBC  WBC 4.0 - 10.5 K/uL 10.7  10.5  12.1   Hemoglobin 13.0 - 17.0 g/dL 8.8  8.2  8.5   Hematocrit 39.0 - 52.0 % 27.2  26.3  28.2   Platelets 150 - 400 K/uL 250  238  260       Latest Ref Rng & Units 06/02/2023    5:52 AM 06/01/2023    5:12 AM 05/31/2023    5:42 AM  BMP  Glucose 70 - 99 mg/dL 161  096   77   BUN 8 - 23 mg/dL 49  52  53   Creatinine 0.61 - 1.24 mg/dL 0.45  4.09  8.11   Sodium 135 - 145 mmol/L 139  137  140   Potassium 3.5 - 5.1 mmol/L 3.6  3.3  3.5   Chloride 98 - 111 mmol/L 106  107  111   CO2 22 - 32 mmol/L 21  18  16    Calcium 8.9 - 10.3 mg/dL 8.9  8.7  9.2    Phosphorus: 5.4 Albumin: 2.8 GFR: 12  ANA Ab: Negative GBM Ab: <0.2  Assessment/Plan:  Principal Problem:   Acute kidney injury superimposed on chronic kidney disease (HCC) Active Problems:   Elevated PSA   Nephrolithiasis   Bilateral lower extremity edema   Chronic atrial fibrillation (HCC)   Anemia   Metabolic acidosis   Abnormal urinalysis   Proteinuria  Calvin Schultz is a 73  y.o. with a PMH of HTN and T2DM and neprholithiasis presenting with concerns of an increased creatinine admitted for an AKI superimposed on CKD on HD 3.    AKI superimposed on CKD NAGMA Proteinuria Staghorn calculus (L)- non-obstructing Hx of bilateral staghorn calculi s/p surgery mod to severe hydronephrosis on the left  Nephrolithiasis  Nephrology and Urology following, appreciate recommendations. Creatinine increased to 4.81 from 4.71 yesterday. US showing hydronephrosis on the left. Multiple stones but pt is asymptomatic. CT concerning for L UPJ stricture. Urology elects to proceed with with left percutaneous nephrostomy tube and outpatient percutaneous nephrolithotomy. IR consulted by urology. ANA and GBM antibodies negative. Kappa free chain, lambda free chains and Kappa/Lambda ratio elevated. Suspect this is 2/2 to CKD as ratio is <3. BLE edema much improved since admission. Patient urinating. Lasix switched to 80 mg IV daily from TID as creatinine continues to increase. 600 mL output measured yesterday; however, nursing reports this AM patient had 6 urinals in the bathroom equaling ~3700 mL. - Appreciate Nephrology - Appreciate Urology  - Recommend nephrology outpatient follow-up - Strict I/O - Renal function panel  tomorrow AM  Hypokalemia K+ stable at 3.6. Last Mag 1.9. Magnesium sulfate 2 g IV to increase Mag level >2.  - Mag lab tomorrow AM   Elevated PSA Urology following, recommend repeat PSA in 6 weeks. Appreciate recommendations.  - PSA in 6 weeks outpatient    Iron Deficiency Anemia Hgb 8.8 today and stable. Per chart review, (+) cologuard in 2023. Do not see colonoscopy follow-up. Will likely need one outpatient.    HTN Hypertensive episode overnight with normal BP this AM. BP elevated at last measurement 146/88. Will continue holding home Olmesartan Medoxomil in setting of AKI. Amlodipine 5mg  started on admission.  - CTM - Continue Amlodipine 5mg    HLD Last lipid panel noted on 07/19/21 WNL.  -Continue Rosuvastatin.   TII DM Glucose 108 this AM.  - CTM - SSI  - Hypoglycemia precautions   A Fib On Eliquis at home. - Continue holding home Eliquis 5 mg BID as patient prepares for procedures   Diet: Cardiac diet; NPO at midnight VTE: Holding Eliquis as patient prepares for procedures Fluids: None Code: Full   Dispo: Anticipate discharge in 1 day.   LOS: 2 days   Bubba Hales, Medical Student 06/02/2023, 1:37 PM

## 2023-06-02 NOTE — Progress Notes (Signed)
Patient ID: Calvin Schultz, male   DOB: 02-15-1951, 73 y.o.   MRN: [id number] S: No new complaints.  He decided to stay for nephrostomy tube placement, but it is delayed due to Eliquis. O:BP (!) 146/88 (BP Location: Right Arm)   Pulse 93   Temp 98.2 F (36.8 C) (Oral)   Resp 18   Ht 5\' 9"  (1.753 m)   Wt 101.6 kg   SpO2 95%   BMI 33.08 kg/m   Intake/Output Summary (Last 24 hours) at 06/02/2023 1157 Last data filed at 06/02/2023 1032 Gross per 24 hour  Intake --  Output 4150 ml  Net -4150 ml   Intake/Output: I/O last 3 completed shifts: In: 340 [P.O.:240; IV Piggyback:100] Out: 600 [Urine:600]  Intake/Output this shift:  Total I/O In: -  Out: 4150 [Urine:4150] Weight change:  Gen: NAD CVS: RRR Resp:CTA  Abd:+BS, soft, NT/ND Ext: 1+ pretibial edema  Recent Labs  Lab 05/30/23 0856 05/31/23 0542 06/01/23 0512 06/02/23 0552  NA 140 140 137 139  K 3.8 3.5 3.3* 3.6  CL 111 111 107 106  CO2 15* 16* 18* 21*  GLUCOSE 128* 77 121* 108*  BUN 55* 53* 52* 49*  CREATININE 4.46* 4.43* 4.71* 4.81*  ALBUMIN 2.9*  --  2.7* 2.8*  CALCIUM 8.8* 9.2 8.7* 8.9  PHOS 5.8*  --  5.9* 5.4*  AST 14*  --   --   --   ALT 16  --   --   --    Liver Function Tests: Recent Labs  Lab 05/30/23 0856 06/01/23 0512 06/02/23 0552  AST 14*  --   --   ALT 16  --   --   ALKPHOS 101  --   --   BILITOT 0.6  --   --   PROT 6.1*  --   --   ALBUMIN 2.9* 2.7* 2.8*   No results for input(s): "LIPASE", "AMYLASE" in the last 168 hours. No results for input(s): "AMMONIA" in the last 168 hours. CBC: Recent Labs  Lab 05/30/23 0856 05/31/23 0542 06/01/23 0512 06/02/23 0552  WBC 12.0* 12.1* 10.5 10.7*  NEUTROABS 9.9*  --   --   --   HGB 8.0* 8.5* 8.2* 8.8*  HCT 26.4* 28.2* 26.3* 27.2*  MCV 89.2 89.0 85.9 85.3  PLT 221 260 238 250   Cardiac Enzymes: No results for input(s): "CKTOTAL", "CKMB", "CKMBINDEX", "TROPONINI" in the last 168 hours. CBG: No results for input(s): "GLUCAP" in the last 168  hours.  Iron Studies: No results for input(s): "IRON", "TIBC", "TRANSFERRIN", "FERRITIN" in the last 72 hours. Studies/Results: No results found.  amLODipine  5 mg Oral Daily   furosemide  80 mg Intravenous Daily   rosuvastatin  40 mg Oral Daily   sevelamer carbonate  800 mg Oral TID WC    BMET    Component Value Date/Time   NA 139 06/02/2023 0552   NA 139 06/25/2021 1539   K 3.6 06/02/2023 0552   CL 106 06/02/2023 0552   CO2 21 (L) 06/02/2023 0552   GLUCOSE 108 (H) 06/02/2023 0552   BUN 49 (H) 06/02/2023 0552   BUN 22 06/25/2021 1539   CREATININE 4.81 (H) 06/02/2023 0552   CALCIUM 8.9 06/02/2023 0552   GFRNONAA 12 (L) 06/02/2023 0552   CBC    Component Value Date/Time   WBC 10.7 (H) 06/02/2023 0552   RBC 3.19 (L) 06/02/2023 0552   HGB 8.8 (L) 06/02/2023 0552   HGB 13.2 06/25/2021 1539  HCT 27.2 (L) 06/02/2023 0552   HCT 41.2 06/25/2021 1539   PLT 250 06/02/2023 0552   PLT 367 06/25/2021 1539   MCV 85.3 06/02/2023 0552   MCV 84 06/25/2021 1539   MCH 27.6 06/02/2023 0552   MCHC 32.4 06/02/2023 0552   RDW 13.9 06/02/2023 0552   RDW 12.6 06/25/2021 1539   LYMPHSABS 0.7 05/30/2023 0856   LYMPHSABS 2.4 06/25/2021 1539   MONOABS 1.1 (H) 05/30/2023 0856   EOSABS 0.2 05/30/2023 0856   EOSABS 0.2 06/25/2021 1539   BASOSABS 0.0 05/30/2023 0856   BASOSABS 0.1 06/25/2021 1539    Assessment/Plan:   AKI/CKD stage IV vs progressive disease - no labs for the past year and a half.  Renal US and CT with left sided hydronephrosis and staghorn calculus.  Scr up slightly but remains asymptomatic.  He is now willing to proceed with nephrostomy tube placement due to climbing Scr.  Will need to delay due to Eliquis.  Will continue to follow for now.  Left UPJ obstruction - for left nephrostomy tube placement by IR and Eliquis on hold since 06/01/23. Elevated PSA - f/u with Dr. Ronne Binning. Iron deficiency anemia - CKD likely contributing.  Will check iron stores and start ESA. HTN -  continue to hold olmesartan in AKI.  Continue with amlodipine.   Irena Cords, MD [facility]

## 2023-06-02 NOTE — Consult Note (Addendum)
Chief Complaint: Patient was seen in consultation today for renal stones and hydronephrosis--- for Left percutaneous nephrostomy drain placement Chief Complaint  Patient presents with   Abnormal Lab   at the request of Tressie Stalker NP   Supervising Physician: Marliss Coots  Patient Status: [facility] - In-pt  History of Present Illness: Calvin Schultz is a 73 y.o. male   FULL Code status per pt Previous renal stones; 30 yrs or more New stone; new left hydronephrosis  CT 1/25:IMPRESSION: 1. There are several calculi in the left renal collecting system with largest branching calculus in the interpolar region measuring up to 1.3 x 1.7 cm, favored to represent portion of staghorn calculus. There is moderate to severe left hydronephrosis with moderate left renal cortical thinning; however, left ureter is nondilated. No left ureterolithiasis. 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 Consider contrast enhanced/delayed images for further evaluation. 2. There are additional left renal calculi and a 5 mm nonobstructing calculus in the right kidney lower pole. No right hydroureteronephrosis or ureterolithiasis.  Request made for Left PCN placement per Urology Proceed with left percutaneous nephrostomy tube and outpatient percutaneous nephrolithotomy. IR consulted.   LD Eliquis 1/27   Approved with Dr Elby Showers Planned for 1/29   Past Medical History:  Diagnosis Date   Arthritis    CKD (chronic kidney disease) stage 3, GFR 30-59 ml/min (HCC)    Diabetes mellitus without complication (HCC)    Hyperlipidemia    Hypertension     Past Surgical History:  Procedure Laterality Date   KIDNEY SURGERY      Allergies: Patient has no known allergies.  Medications: Prior to Admission medications   Medication Sig Start Date End Date Taking? Authorizing Provider  ELIQUIS 5 MG TABS tablet Take 5 mg by mouth 2 (two) times  daily. 08/24/21  Yes [provider]  LEVEMIR FLEXTOUCH 100 UNIT/ML FlexTouch Pen Inject 20 Units into the skin at bedtime. 06/27/21  Yes [provider]  metoprolol succinate (TOPROL-XL) 25 MG 24 hr tablet TAKE 1 TABLET BY MOUTH EVERY DAY IN THE MORNING 05/26/22  Yes Tolia, Sunit, DO  rosuvastatin (CRESTOR) 40 MG tablet Take 40 mg by mouth daily. 08/27/21  Yes [provider]     Family History  Problem Relation Age of Onset   Dementia Mother     Social History   Socioeconomic History   Marital status: Married    Spouse name: Not on file   Number of children: 1   Years of education: Not on file   Highest education level: Not on file  Occupational History   Not on file  Tobacco Use   Smoking status: Never   Smokeless tobacco: Never  Vaping Use   Vaping status: Never Used  Substance and Sexual Activity   Alcohol use: Never   Drug use: Never   Sexual activity: Not on file  Other Topics Concern   Not on file  Social History Narrative   Not on file   Social Drivers of Health   Financial Resource Strain: Not on file  Food Insecurity: No Food Insecurity (05/30/2023)   Hunger Vital Sign    Worried About Running Out of Food in the Last Year: Never true    Ran Out of Food in the Last Year: Never true  Transportation Needs: No Transportation Needs (05/30/2023)   PRAPARE - Administrator, Civil Service (Medical): No    Lack of Transportation (  Non-Medical): No  Physical Activity: Not on file  Stress: Not on file  Social Connections: Moderately Isolated (05/30/2023)   Social Connection and Isolation Panel [NHANES]    Frequency of Communication with Friends and Family: More than three times a week    Frequency of Social Gatherings with Friends and Family: More than three times a week    Attends Religious Services: Never    Database administrator or Organizations: No    Attends Banker Meetings: Never    Marital Status: Married     Review of Systems: A 12 point ROS discussed and pertinent positives are indicated in the HPI above.  All other systems are negative.  Review of Systems  Constitutional:  Positive for activity change. Negative for fatigue and fever.  Respiratory:  Negative for cough and shortness of breath.   Cardiovascular:  Negative for chest pain.  Gastrointestinal:  Negative for abdominal pain and nausea.  Musculoskeletal:  Negative for back pain.  Psychiatric/Behavioral:  Negative for behavioral problems and confusion.     Vital Signs: BP (!) 146/88 (BP Location: Right Arm)   Pulse 93   Temp 98.2 F (36.8 C) (Oral)   Resp 18   Ht 5\' 9"  (1.753 m)   Wt 223 lb 15.8 oz (101.6 kg)   SpO2 95%   BMI 33.08 kg/m   Advance Care Plan: The advanced care plan/surrogate decision maker was discussed at the time of visit and documented in the medical record.    Physical Exam Vitals reviewed.  HENT:     Mouth/Throat:     Mouth: Mucous membranes are moist.  Cardiovascular:     Rate and Rhythm: Normal rate.     Heart sounds: No murmur heard. Pulmonary:     Effort: Pulmonary effort is normal.     Breath sounds: Normal breath sounds. No wheezing.  Abdominal:     Tenderness: There is no abdominal tenderness.  Musculoskeletal:        General: Normal range of motion.  Skin:    General: Skin is warm.  Neurological:     Mental Status: He is alert and oriented to person, place, and time.  Psychiatric:        Behavior: Behavior normal.     Imaging: ECHOCARDIOGRAM COMPLETE Result Date: 05/31/2023    ECHOCARDIOGRAM REPORT   Patient Name:   Calvin Schultz Date of Exam: 05/31/2023 Medical Rec #:  [id number]   Height:       69.0 in Accession #:    [id number]  Weight:       238.1 lb Date of Birth:  01-Oct-1950  BSA:          2.225 m Patient Age:    72 years    BP:           162/97 mmHg Patient Gender: M           HR:           76 bpm. Exam Location:  Inpatient Procedure: 2D Echo, Color Doppler and Cardiac  Doppler Indications:    Lower extremity edema  History:        Patient has no prior history of Echocardiogram examinations.                 Chronic Kidney Disease.  Sonographer:    Rosaland Lao Sonographer#2:  Delcie Roch RDCS Referring Phys: [id number] JULIE ANNE WILLIAMS IMPRESSIONS  1. Left ventricular ejection fraction, by estimation, is 60 to 65%. The left  ventricle has normal function. The left ventricle has no regional wall motion abnormalities. The left ventricular internal cavity size was moderately dilated. There is moderate  left ventricular hypertrophy. Left ventricular diastolic parameters were normal.  2. Right ventricular systolic function is normal. The right ventricular size is mildly enlarged.  3. Left atrial size was mildly dilated.  4. Right atrial size was mildly dilated.  5. The mitral valve is normal in structure. Mild mitral valve regurgitation. No evidence of mitral stenosis.  6. The aortic valve is tricuspid. Aortic valve regurgitation is not visualized. Aortic valve sclerosis is present, with no evidence of aortic valve stenosis.  7. Aortic dilatation noted. There is borderline dilatation of the aortic root, measuring 38 mm.  8. The inferior vena cava is normal in size with greater than 50% respiratory variability, suggesting right atrial pressure of 3 mmHg. Comparison(s): No prior Echocardiogram. FINDINGS  Left Ventricle: Left ventricular ejection fraction, by estimation, is 60 to 65%. The left ventricle has normal function. The left ventricle has no regional wall motion abnormalities. The left ventricular internal cavity size was moderately dilated. There is moderate left ventricular hypertrophy. Left ventricular diastolic parameters were normal. Right Ventricle: The right ventricular size is mildly enlarged. Right ventricular systolic function is normal. Left Atrium: Left atrial size was mildly dilated. Right Atrium: Right atrial size was mildly dilated. Pericardium: There is no  evidence of pericardial effusion. Mitral Valve: The mitral valve is normal in structure. Mild mitral annular calcification. Mild mitral valve regurgitation. No evidence of mitral valve stenosis. Tricuspid Valve: The tricuspid valve is normal in structure. Tricuspid valve regurgitation is trivial. No evidence of tricuspid stenosis. Aortic Valve: The aortic valve is tricuspid. Aortic valve regurgitation is not visualized. Aortic valve sclerosis is present, with no evidence of aortic valve stenosis. Pulmonic Valve: The pulmonic valve was normal in structure. Pulmonic valve regurgitation is not visualized. No evidence of pulmonic stenosis. Aorta: Aortic dilatation noted. There is borderline dilatation of the aortic root, measuring 38 mm. Venous: The inferior vena cava is normal in size with greater than 50% respiratory variability, suggesting right atrial pressure of 3 mmHg. IAS/Shunts: No atrial level shunt detected by color flow Doppler.  LEFT VENTRICLE PLAX 2D LVIDd:         6.10 cm   Diastology LVIDs:         4.30 cm   LV e' medial:    9.81 cm/s LV PW:         1.40 cm   LV E/e' medial:  10.5 LV IVS:        1.20 cm   LV e' lateral:   9.24 cm/s LVOT diam:     2.20 cm   LV E/e' lateral: 11.1 LV SV:         81 LV SV Index:   36 LVOT Area:     3.80 cm  RIGHT VENTRICLE             IVC RV Basal diam:  4.30 cm     IVC diam: 2.10 cm RV Mid diam:    3.80 cm RV S prime:     16.00 cm/s TAPSE (M-mode): 3.2 cm LEFT ATRIUM             Index        RIGHT ATRIUM           Index LA diam:        5.20 cm 2.34 cm/m   RA Area:  24.50 cm LA Vol (A2C):   50.8 ml 22.83 ml/m  RA Volume:   85.60 ml  38.47 ml/m LA Vol (A4C):   72.1 ml 32.41 ml/m LA Biplane Vol: 60.8 ml 27.33 ml/m  AORTIC VALVE LVOT Vmax:   102.00 cm/s LVOT Vmean:  64.800 cm/s LVOT VTI:    0.213 m  AORTA Ao Root diam: 3.80 cm Ao Asc diam:  3.60 cm MITRAL VALVE MV Area (PHT): 5.97 cm     SHUNTS MV Decel Time: 127 msec     Systemic VTI:  0.21 m MV E velocity: 103.00  cm/s  Systemic Diam: 2.20 cm MV A velocity: 107.00 cm/s MV E/A ratio:  0.96 [name] MD Electronically signed by [name] MD Signature Date/Time: 05/31/2023/12:19:56 PM    Final    DG Chest 2 View Result Date: 05/30/2023 CLINICAL DATA:  Acute renal injury EXAM: CHEST - 2 VIEW COMPARISON:  None Available. FINDINGS: Normal lung volumes. No focal consolidations. Blunting of the left costophrenic angle with thickening of the left lateral pleura. No pneumothorax. Mildly enlarged cardiomediastinal silhouette. No acute osseous abnormality. IMPRESSION: 1. Mild cardiomegaly. 2. Blunting of the left costophrenic angle, which may represent pleural thickening. Electronically Signed   By: Agustin Cree M.D.   On: 05/30/2023 16:57   CT ABDOMEN PELVIS WO CONTRAST Result Date: 05/30/2023 CLINICAL DATA:  unilateral significant hydro on [place], creat 4.5 new diagnosis renal failure, eval for cause of hydro. EXAM: CT ABDOMEN AND PELVIS WITHOUT CONTRAST TECHNIQUE: Multidetector CT imaging of the abdomen and pelvis was performed following the standard protocol without IV contrast. RADIATION DOSE REDUCTION: This exam was performed according to the departmental dose-optimization program which includes automated exposure control, adjustment of the mA and/or kV according to patient size and/or use of iterative reconstruction technique. COMPARISON:  CT scan urogram report from 09/12/2014. Please note images are not available for review at the time of dictation. FINDINGS: Lower chest: There are atelectatic changes at the left lung base. The lung bases are otherwise clear. No pleural effusion. The heart is normal in size. No pericardial effusion. Hepatobiliary: The liver is normal in size. There is subtle irregularity/nodularity of the liver surface, raising the concern for liver parenchymal disease. Correlate clinically. No suspicious mass. No intrahepatic or extrahepatic bile duct dilation. There are small volume calcified  gallstones, without imaging signs of acute cholecystitis. Normal gallbladder wall thickness. No pericholecystic inflammatory changes. Pancreas: Unremarkable. No pancreatic ductal dilatation or surrounding inflammatory changes. Spleen: Within normal limits. No focal lesion. Adrenals/Urinary Tract: Adrenal glands are unremarkable. There is a 5 mm nonobstructing calculus in the right kidney lower pole. There is mild right hydroureter however, no discrete obstructing mass ureteric calculus seen. No right hydronephrosis. Mild right perinephric fat stranding, nonspecific. There are several calculi in the left kidney with largest branching calculus in the interpolar region measuring up to 1.3 x 1.7 cm. There is moderate-to-severe left hydronephrosis with moderate thinning of the left renal cortex. However, left ureter is nondilated. The above-mentioned calculus in the interpolar region may represent portion of staghorn calculus with probable associated renal UPJ stricture. However, examination is limited due to lack of intravenous contrast. There is mild left perinephric fat stranding as well. Urinary bladder is partially distended precluding optimal assessment however, no urinary bladder mass or calculi seen. No significant bladder wall thickening or perivesical fat stranding. Stomach/Bowel: No disproportionate dilation of the small or large bowel loops. No evidence of abnormal bowel wall thickening or inflammatory changes. The appendix is  unremarkable. There are scattered diverticula throughout the colon, without imaging signs of diverticulitis. Vascular/Lymphatic: No ascites or pneumoperitoneum. No abdominal or pelvic lymphadenopathy, by size criteria. No aneurysmal dilation of the major abdominal arteries. There are mild peripheral atherosclerotic vascular calcifications of the aorta and its major branches. Reproductive: Enlarged prostate. Symmetric seminal vesicles. Other: There is a tiny fat containing umbilical  hernia. There is mild anasarca. Musculoskeletal: No suspicious osseous lesions. There are mild - moderate multilevel degenerative changes in the visualized spine. IMPRESSION: 1. There are several calculi in the left renal collecting system with largest branching calculus in the interpolar region measuring up to 1.3 x 1.7 cm, favored to represent portion of staghorn calculus. There is moderate to severe left hydronephrosis with moderate left renal cortical thinning; however, left ureter is nondilated. No left ureterolithiasis. 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 Consider contrast enhanced/delayed images for further evaluation. 2. There are additional left renal calculi and a 5 mm nonobstructing calculus in the right kidney lower pole. No right hydroureteronephrosis or ureterolithiasis. 3. Multiple other nonacute observations (such as subtle liver surface irregularity/nodularity, cholelithiasis without acute cholecystitis, scattered colonic diverticulosis, etc.), As described above. Electronically Signed   By: Jules Schick M.D.   On: 05/30/2023 16:23   US RENAL Result Date: 05/30/2023 CLINICAL DATA:  Acute kidney injury. EXAM: RENAL / URINARY TRACT ULTRASOUND COMPLETE COMPARISON:  Report of renal ultrasound 06/08/2015. Images are not currently available. FINDINGS: Right Kidney: Renal measurements: 11.1 x 5.1 x 7.6 cm = volume: 228 mL. Echogenicity within normal limits. No mass or hydronephrosis visualized. Left Kidney: Renal measurements: 12.8 x 7.6 x 7.6 cm = volume: 247 mL. Moderate hydronephrosis is present. Renal parenchyma is within normal limits. Bladder: Appears normal for degree of bladder distention. Other: None. IMPRESSION: 1. Moderate left hydronephrosis. 2. Normal sonographic appearance of the right kidney. Electronically Signed   By: Marin Roberts M.D.   On: 05/30/2023 12:51    Labs:  CBC: Recent Labs     05/30/23 0856 05/31/23 0542 06/01/23 0512 06/02/23 0552  WBC 12.0* 12.1* 10.5 10.7*  HGB 8.0* 8.5* 8.2* 8.8*  HCT 26.4* 28.2* 26.3* 27.2*  PLT 221 260 238 250    COAGS: No results for input(s): "INR", "APTT" in the last 8760 hours.  BMP: Recent Labs    05/30/23 0856 05/31/23 0542 06/01/23 0512 06/02/23 0552  NA 140 140 137 139  K 3.8 3.5 3.3* 3.6  CL 111 111 107 106  CO2 15* 16* 18* 21*  GLUCOSE 128* 77 121* 108*  BUN 55* 53* 52* 49*  CALCIUM 8.8* 9.2 8.7* 8.9  CREATININE 4.46* 4.43* 4.71* 4.81*  GFRNONAA 13* 14* 13* 12*    LIVER FUNCTION TESTS: Recent Labs    05/30/23 0856 06/01/23 0512 06/02/23 0552  BILITOT 0.6  --   --   AST 14*  --   --   ALT 16  --   --   ALKPHOS 101  --   --   PROT 6.1*  --   --   ALBUMIN 2.9* 2.7* 2.8*    TUMOR MARKERS: No results for input(s): "AFPTM", "CEA", "CA199", "CHROMGRNA" in the last 8760 hours.  Assessment and Plan:  Scheduled for L percutaneous nephrostomy drain placement in IR 1/29 Risks and benefits of Left PCN placement was discussed with the patient including, but not limited to, infection, bleeding, significant bleeding causing loss or decrease in renal function or damage to adjacent structures.  All of the patient's questions were answered, patient is agreeable to proceed.  Consent signed and in chart.  Thank you for this interesting consult.  I greatly enjoyed meeting Calvin Schultz and look forward to participating in their care.  A copy of this report was sent to the requesting provider on this date.  Electronically Signed: Robet Leu, PA-C 06/02/2023, 1:08 PM   I spent a total of 20 Minutes    in face to face in clinical consultation, greater than 50% of which was counseling/coordinating care for L PCN placement

## 2023-06-02 NOTE — Progress Notes (Signed)
     Subjective: NAEON.  Reviewed the natural history of this kidney stone development and available interventions again, now that his wife was able to join [place].  Objective: Vital signs in last 24 hours: Temp:  [97.8 F (36.6 C)-98.2 F (36.8 C)] 98.2 F (36.8 C) (01/28 0734) Pulse Rate:  [59-93] 93 (01/28 0734) Resp:  [17-20] 18 (01/28 0734) BP: (138-164)/(64-88) 146/88 (01/28 0734) SpO2:  [95 %-100 %] 95 % (01/28 0734) Weight:  [101.6 kg] 101.6 kg (01/28 0820)  Assessment/Plan: #AoCKD # Staghorn stone # Hydronephrosis  CT A/P collected 05/30/2023 shows 13 x 17 mm left UPJ stone with associated hydronephrosis and moderate cortical thinning.  Progressing AKI. Trend labs.  4.43-->4.71-->4.81  Lasix down to daily administration from TID.   Holding Eliquis. Pt to stay in hospital through washout. Last dose 1/27 a.m.  Proceed with left percutaneous nephrostomy tube and outpatient percutaneous nephrolithotomy. IR consulted.   Intake/Output from previous day: 01/27 0701 - 01/28 0700 In: -  Out: 600 [Urine:600]  Intake/Output this shift: Total I/O In: -  Out: 4150 [Urine:4150]  Physical Exam:  General: Alert and oriented CV: No cyanosis Lungs: equal chest rise  Lab Results: Recent Labs    05/31/23 0542 06/01/23 0512 06/02/23 0552  HGB 8.5* 8.2* 8.8*  HCT 28.2* 26.3* 27.2*   BMET Recent Labs    06/01/23 0512 06/02/23 0552  NA 137 139  K 3.3* 3.6  CL 107 106  CO2 18* 21*  GLUCOSE 121* 108*  BUN 52* 49*  CREATININE 4.71* 4.81*  CALCIUM 8.7* 8.9     Studies/Results: No results found.     LOS: 2 days   Elmon Kirschner, NP [facility] Pager: [phone]  06/02/2023, 11:12 AM

## 2023-06-02 NOTE — Plan of Care (Signed)
  Problem: Education: Goal: Knowledge of General Education information will improve Description: Including pain rating scale, medication(s)/side effects and non-pharmacologic comfort measures Outcome: Progressing   Problem: Activity: Goal: Risk for activity intolerance will decrease Outcome: Progressing   Problem: Nutrition: Goal: Adequate nutrition will be maintained Outcome: Progressing   Problem: Coping: Goal: Level of anxiety will decrease Outcome: Progressing   Problem: Elimination: Goal: Will not experience complications related to bowel motility Outcome: Progressing   Problem: Pain Managment: Goal: General experience of comfort will improve and/or be controlled Outcome: Progressing

## 2023-06-02 NOTE — Plan of Care (Signed)
  Problem: Education: Goal: Knowledge of General Education information will improve Description: Including pain rating scale, medication(s)/side effects and non-pharmacologic comfort measures Outcome: Progressing   Problem: Health Behavior/Discharge Planning: Goal: Ability to manage health-related needs will improve Outcome: Progressing   Problem: Clinical Measurements: Goal: Ability to maintain clinical measurements within normal limits will improve Outcome: Progressing Goal: Will remain free from infection Outcome: Progressing Goal: Diagnostic test results will improve Outcome: Progressing Goal: Respiratory complications will improve Outcome: Progressing Goal: Cardiovascular complication will be avoided Outcome: Progressing   Problem: Activity: Goal: Risk for activity intolerance will decrease Outcome: Progressing   Problem: Nutrition: Goal: Adequate nutrition will be maintained Outcome: Progressing   Problem: Coping: Goal: Level of anxiety will decrease Outcome: Progressing   Problem: Elimination: Goal: Will not experience complications related to bowel motility Outcome: Progressing Goal: Will not experience complications related to urinary retention Outcome: Progressing   Problem: Pain Managment: Goal: General experience of comfort will improve and/or be controlled Outcome: Progressing   Problem: Safety: Goal: Ability to remain free from injury will improve Outcome: Progressing   Problem: Skin Integrity: Goal: Risk for impaired skin integrity will decrease Outcome: Progressing   Problem: Education: Goal: Knowledge of disease and its progression will improve Outcome: Progressing   Problem: Fluid Volume: Goal: Compliance with measures to maintain balanced fluid volume will improve Outcome: Progressing   Problem: Health Behavior/Discharge Planning: Goal: Ability to manage health-related needs will improve Outcome: Progressing   Problem:  Nutritional: Goal: Ability to make healthy dietary choices will improve Outcome: Progressing   Problem: Clinical Measurements: Goal: Complications related to the disease process, condition or treatment will be avoided or minimized Outcome: Progressing

## 2023-06-02 NOTE — Plan of Care (Addendum)
Spoke to pt by phone and shared decision was made to proceed with left percutaneous nephrostomy tube and outpt percutaneous nephrolithotomy. Consult has been placed. Provided diet this morning while continuing with Eliquis washout. Primary team was to hold [facility] last night, so his last dose should have been 06/01/23 am. I will be by to assess him formally later today

## 2023-06-03 ENCOUNTER — Inpatient Hospital Stay (HOSPITAL_COMMUNITY): Payer: No Typology Code available for payment source

## 2023-06-03 HISTORY — PX: IR NEPHROSTOMY PLACEMENT LEFT: IMG6063

## 2023-06-03 LAB — IRON AND TIBC
Iron: 45 ug/dL (ref 45–182)
Saturation Ratios: 12 % — ABNORMAL LOW (ref 17.9–39.5)
TIBC: 384 ug/dL (ref 250–450)
UIBC: 339 ug/dL

## 2023-06-03 LAB — RENAL FUNCTION PANEL
Albumin: 3.3 g/dL — ABNORMAL LOW (ref 3.5–5.0)
Anion gap: 15 (ref 5–15)
BUN: 54 mg/dL — ABNORMAL HIGH (ref 8–23)
CO2: 20 mmol/L — ABNORMAL LOW (ref 22–32)
Calcium: 9.2 mg/dL (ref 8.9–10.3)
Chloride: 100 mmol/L (ref 98–111)
Creatinine, Ser: 5.34 mg/dL — ABNORMAL HIGH (ref 0.61–1.24)
GFR, Estimated: 11 mL/min — ABNORMAL LOW (ref >60)
Glucose, Bld: 120 mg/dL — ABNORMAL HIGH (ref 70–99)
Phosphorus: 5.9 mg/dL — ABNORMAL HIGH (ref 2.5–4.6)
Potassium: 3.6 mmol/L (ref 3.5–5.1)
Sodium: 135 mmol/L (ref 135–145)

## 2023-06-03 LAB — CBC
HCT: 32.4 % — ABNORMAL LOW (ref 39.0–52.0)
Hemoglobin: 10.2 g/dL — ABNORMAL LOW (ref 13.0–17.0)
MCH: 26.9 pg (ref 26.0–34.0)
MCHC: 31.5 g/dL (ref 30.0–36.0)
MCV: 85.5 fL (ref 80.0–100.0)
Platelets: 304 10*3/uL (ref 150–400)
RBC: 3.79 MIL/uL — ABNORMAL LOW (ref 4.22–5.81)
RDW: 14.1 % (ref 11.5–15.5)
WBC: 11.8 10*3/uL — ABNORMAL HIGH (ref 4.0–10.5)
nRBC: 0 % (ref 0.0–0.2)

## 2023-06-03 LAB — PROTEIN ELECTROPHORESIS, SERUM
A/G Ratio: 1 (ref 0.7–1.7)
Albumin ELP: 3 g/dL (ref 2.9–4.4)
Alpha-1-Globulin: 0.3 g/dL (ref 0.0–0.4)
Alpha-2-Globulin: 0.9 g/dL (ref 0.4–1.0)
Beta Globulin: 1.1 g/dL (ref 0.7–1.3)
Gamma Globulin: 0.8 g/dL (ref 0.4–1.8)
Globulin, Total: 3 g/dL (ref 2.2–3.9)
Total Protein ELP: 6 g/dL (ref 6.0–8.5)

## 2023-06-03 LAB — PROTIME-INR
INR: 1.1 (ref 0.8–1.2)
Prothrombin Time: 14.9 s (ref 11.4–15.2)

## 2023-06-03 LAB — MAGNESIUM: Magnesium: 2.4 mg/dL (ref 1.7–2.4)

## 2023-06-03 MED ORDER — APIXABAN 5 MG PO TABS
5.0000 mg | ORAL_TABLET | Freq: Two times a day (BID) | ORAL | Status: DC
  Administered 2023-06-04 – 2023-06-05 (×3): 5 mg via ORAL
  Filled 2023-06-03 (×3): qty 1

## 2023-06-03 MED ORDER — MIDAZOLAM HCL 2 MG/2ML IJ SOLN
INTRAMUSCULAR | Status: AC | PRN
  Administered 2023-06-03: 1 mg via INTRAVENOUS
  Administered 2023-06-03: .5 mg via INTRAVENOUS

## 2023-06-03 MED ORDER — SODIUM CHLORIDE 0.9% FLUSH
5.0000 mL | Freq: Three times a day (TID) | INTRAVENOUS | Status: DC
  Administered 2023-06-03 – 2023-06-05 (×8): 5 mL

## 2023-06-03 MED ORDER — FENTANYL CITRATE (PF) 100 MCG/2ML IJ SOLN
INTRAMUSCULAR | Status: AC
  Filled 2023-06-03: qty 2

## 2023-06-03 MED ORDER — FENTANYL CITRATE (PF) 100 MCG/2ML IJ SOLN
INTRAMUSCULAR | Status: AC | PRN
  Administered 2023-06-03: 50 ug via INTRAVENOUS
  Administered 2023-06-03: 25 ug via INTRAVENOUS

## 2023-06-03 MED ORDER — LIDOCAINE-EPINEPHRINE 1 %-1:100000 IJ SOLN
INTRAMUSCULAR | Status: AC
  Filled 2023-06-03: qty 1

## 2023-06-03 MED ORDER — MIDAZOLAM HCL 2 MG/2ML IJ SOLN
INTRAMUSCULAR | Status: AC
  Filled 2023-06-03: qty 2

## 2023-06-03 MED ORDER — SODIUM CHLORIDE 0.9 % IV SOLN
INTRAVENOUS | Status: AC | PRN
  Administered 2023-06-03: 2 g via INTRAVENOUS

## 2023-06-03 MED ORDER — SODIUM CHLORIDE 0.9 % IV SOLN
INTRAVENOUS | Status: AC
  Filled 2023-06-03: qty 20

## 2023-06-03 MED ORDER — IOHEXOL 300 MG/ML  SOLN
50.0000 mL | Freq: Once | INTRAMUSCULAR | Status: AC | PRN
  Administered 2023-06-03: 10 mL

## 2023-06-03 NOTE — TOC Initial Note (Signed)
Transition of Care [facility]) - Initial/Assessment Note    Patient Details  Name: Calvin Schultz MRN: [id number] Date of Birth: 12/18/1950  Transition of Care [facility]) CM/SW Contact:    Epifanio Lesches, RN Phone Number: 06/03/2023, 11:40 AM  Clinical Narrative:                 Admitted with  AKI/renal failure. Hx of nephrolithiasis, CKD. From home with wife. PTA independent with ADL's. States uses cane with ambulation. Pt without RX med concerns or transportation issues.     -  1/29 s/p left percutaneous nephrostomy placement....cultures / sensitivities pending  TOC team following and will assist with needs.  Expected Discharge Plan: Home/Self Care Barriers to Discharge: Continued Medical Work up   Patient Goals and CMS Choice            Expected Discharge Plan and Services   Discharge Planning Services: CM Consult   Living arrangements for the past 2 months: Single Family Home     Prior Living Arrangements/Services Living arrangements for the past 2 months: Single Family Home Lives with:: Spouse Patient language and need for interpreter reviewed:: Yes Do you feel safe going back to the place where you live?: Yes      Need for Family Participation in Patient Care: Yes (Comment) Care giver support system in place?: Yes (comment) Current home services: DME (cane) Criminal Activity/Legal Involvement Pertinent to Current Situation/Hospitalization: No - Comment as needed  Activities of Daily Living   ADL Screening (condition at time of admission) Independently performs ADLs?: Yes (appropriate for developmental age) Is the patient deaf or have difficulty hearing?: Yes Does the patient have difficulty seeing, even when wearing glasses/contacts?: No Does the patient have difficulty concentrating, remembering, or making decisions?: No  Permission Sought/Granted                  Emotional Assessment       Orientation: : Oriented to Self, Oriented to Place, Oriented  to  Time, Oriented to Situation Alcohol / Substance Use: Not Applicable Psych Involvement: No (comment)  Admission diagnosis:  AKI (acute kidney injury) (HCC) [N17.9] Acute kidney injury superimposed on chronic kidney disease (HCC) [N17.9, N18.9] Patient Active Problem List   Diagnosis Date Noted   Elevated PSA 05/31/2023   Nephrolithiasis 05/31/2023   Bilateral lower extremity edema 05/31/2023   Chronic atrial fibrillation (HCC) 05/31/2023   Anemia 05/31/2023   Metabolic acidosis 05/31/2023   Abnormal urinalysis 05/31/2023   Proteinuria 05/31/2023   Acute kidney injury superimposed on chronic kidney disease (HCC) 05/30/2023   PCP:  Gwenlyn Found, MD Pharmacy:   [facility] [phone] Ginette Otto, [place] - [address] [place] [address] Phone: [phone] Fax: [phone]     Social Drivers of Health (SDOH) Social History: SDOH Screenings   Food Insecurity: No Food Insecurity (05/30/2023)  Housing: Low Risk  (05/30/2023)  Transportation Needs: No Transportation Needs (05/30/2023)  Utilities: Not At Risk (05/30/2023)  Social Connections: Moderately Isolated (05/30/2023)  Tobacco Use: Low Risk  (05/30/2023)   SDOH Interventions:     Readmission Risk Interventions     No data to display

## 2023-06-03 NOTE — Progress Notes (Signed)
Patient ID: Calvin Schultz, male   DOB: July 14, 1950, 73 y.o.   MRN: [id number] S: Feels well, tolerated left percutaneous nephrostomy tube placement well.  He did have purulent urine aspirate which was sent for culture and sensitivity. O:BP 129/62 (BP Location: Right Arm)   Pulse 97   Temp 97.8 F (36.6 C) (Oral)   Resp 16   Ht 5\' 9"  (1.753 m)   Wt 101.6 kg   SpO2 97%   BMI 33.08 kg/m   Intake/Output Summary (Last 24 hours) at 06/03/2023 0958 Last data filed at 06/02/2023 1800 Gross per 24 hour  Intake 50 ml  Output 1750 ml  Net -1700 ml   Intake/Output: I/O last 3 completed shifts: In: 50 [IV Piggyback:50] Out: 5450 [Urine:5450]  Intake/Output this shift:  No intake/output data recorded. Weight change:  Gen: NAD CVS: RRR Resp:CTA  Abd:+BS, soft, NT/ND Ext: minimal pretibial edema.   Recent Labs  Lab 05/30/23 0856 05/31/23 0542 06/01/23 0512 06/02/23 0552 06/03/23 0647  NA 140 140 137 139 135  K 3.8 3.5 3.3* 3.6 3.6  CL 111 111 107 106 100  CO2 15* 16* 18* 21* 20*  GLUCOSE 128* 77 121* 108* 120*  BUN 55* 53* 52* 49* 54*  CREATININE 4.46* 4.43* 4.71* 4.81* 5.34*  ALBUMIN 2.9*  --  2.7* 2.8* 3.3*  CALCIUM 8.8* 9.2 8.7* 8.9 9.2  PHOS 5.8*  --  5.9* 5.4* 5.9*  AST 14*  --   --   --   --   ALT 16  --   --   --   --    Liver Function Tests: Recent Labs  Lab 05/30/23 0856 06/01/23 0512 06/02/23 0552 06/03/23 0647  AST 14*  --   --   --   ALT 16  --   --   --   ALKPHOS 101  --   --   --   BILITOT 0.6  --   --   --   PROT 6.1*  --   --   --   ALBUMIN 2.9* 2.7* 2.8* 3.3*   No results for input(s): "LIPASE", "AMYLASE" in the last 168 hours. No results for input(s): "AMMONIA" in the last 168 hours. CBC: Recent Labs  Lab 05/30/23 0856 05/31/23 0542 06/01/23 0512 06/02/23 0552 06/03/23 0647  WBC 12.0* 12.1* 10.5 10.7* 11.8*  NEUTROABS 9.9*  --   --   --   --   HGB 8.0* 8.5* 8.2* 8.8* 10.2*  HCT 26.4* 28.2* 26.3* 27.2* 32.4*  MCV 89.2 89.0 85.9 85.3 85.5   PLT 221 260 238 250 304   Cardiac Enzymes: No results for input(s): "CKTOTAL", "CKMB", "CKMBINDEX", "TROPONINI" in the last 168 hours. CBG: No results for input(s): "GLUCAP" in the last 168 hours.  Iron Studies:  Recent Labs    06/03/23 0647  IRON 45  TIBC 384   Studies/Results: IR NEPHROSTOMY PLACEMENT LEFT Result Date: 06/03/2023 INDICATION: 73 year old male with history of obstructive left nephrolithiasis. EXAM: 1. ULTRASOUND GUIDANCE FOR PUNCTURE OF THE LEFT RENAL COLLECTING SYSTEM 2. LEFT PERCUTANEOUS NEPHROSTOMY TUBE PLACEMENT. COMPARISON:  None Available. MEDICATIONS: The patient was receiving intravenous antibiotics as an inpatient. No additional antibiotics were administered. ANESTHESIA/SEDATION: Moderate (conscious) sedation was employed during this procedure. A total of Versed 1.5 mg and Fentanyl 75 mcg was administered intravenously. Moderate Sedation Time: 12 minutes. The patient's level of consciousness and vital signs were monitored continuously by radiology nursing throughout the procedure under my direct supervision. CONTRAST:  10  mL Omnipque 300 - administered into the renal collecting system FLUOROSCOPY TIME:  31 mGy COMPLICATIONS: None immediate. PROCEDURE: The procedure, risks, benefits, and alternatives were explained to the patient. Questions regarding the procedure were encouraged and answered. The patient understands and consents to the procedure. A timeout was performed prior to the initiation of the procedure. The left flank region was prepped and draped in the usual sterile fashion and a sterile drape was applied covering the operative field. A sterile gown and sterile gloves were used for the procedure. Local anesthesia was provided with 1% Lidocaine with epinephrine. Ultrasound was used to localize the left kidney. Under direct ultrasound guidance, a 20 gauge needle was advanced into the renal collecting system. An ultrasound image documentation was performed. Access  within the collecting system was confirmed with the efflux of purulent urine. Over a Nitrex wire, the tract was dilated with an Accustick stent. Next, under intermittent fluoroscopic guidance and over a short Amplatz wire, the track was dilated ultimately allowing placement of a 10-French percutaneous nephrostomy catheter which was advanced to the level of the renal pelvis where the coil was formed and locked. Contrast was injected and several spot fluoroscopic images were obtained in various obliquities. The catheter was secured at the skin with a Prolene retention suture and stat lock device and connected to a gravity bag was placed. Dressings were applied. The patient tolerated procedure well without immediate postprocedural complication. FINDINGS: Ultrasound scanning demonstrates a moderate to severely dilated left collecting system. Under a combination of ultrasound and fluoroscopic guidance, a posterior inferior calix was targeted allowing placement of a 10-French percutaneous nephrostomy catheter with end coiled and locked within the renal pelvis. Contrast injection confirmed appropriate positioning. Purulent urine sample was sent for culture. IMPRESSION: Successful ultrasound and fluoroscopic guided placement of a left sided 10 French percutaneous nephrostomy. Marliss Coots, MD [facility] Radiology Electronically Signed   By: Marliss Coots M.D.   On: 06/03/2023 09:29    amLODipine  5 mg Oral Daily   darbepoetin (ARANESP) injection - NON-DIALYSIS  40 mcg Subcutaneous Q Tue-1800   furosemide  80 mg Intravenous Daily   rosuvastatin  40 mg Oral Daily   sevelamer carbonate  800 mg Oral TID WC   sodium chloride flush  5 mL Intracatheter Q8H    BMET    Component Value Date/Time   NA 135 06/03/2023 0647   NA 139 06/25/2021 1539   K 3.6 06/03/2023 0647   CL 100 06/03/2023 0647   CO2 20 (L) 06/03/2023 0647   GLUCOSE 120 (H) 06/03/2023 0647   BUN 54  (H) 06/03/2023 0647   BUN 22 06/25/2021 1539   CREATININE 5.34 (H) 06/03/2023 0647   CALCIUM 9.2 06/03/2023 0647   GFRNONAA 11 (L) 06/03/2023 0647   CBC    Component Value Date/Time   WBC 11.8 (H) 06/03/2023 0647   RBC 3.79 (L) 06/03/2023 0647   HGB 10.2 (L) 06/03/2023 0647   HGB 13.2 06/25/2021 1539   HCT 32.4 (L) 06/03/2023 0647   HCT 41.2 06/25/2021 1539   PLT 304 06/03/2023 0647   PLT 367 06/25/2021 1539   MCV 85.5 06/03/2023 0647   MCV 84 06/25/2021 1539   MCH 26.9 06/03/2023 0647   MCHC 31.5 06/03/2023 0647   RDW 14.1 06/03/2023 0647   RDW 12.6 06/25/2021 1539   LYMPHSABS 0.7 05/30/2023 0856   LYMPHSABS 2.4 06/25/2021 1539   MONOABS 1.1 (H) 05/30/2023 0856   EOSABS 0.2 05/30/2023  0856   EOSABS 0.2 06/25/2021 1539   BASOSABS 0.0 05/30/2023 0856   BASOSABS 0.1 06/25/2021 1539    Assessment/Plan:   AKI/CKD stage IV vs progressive disease - no labs for the past year and a half.  Renal US and CT with left sided hydronephrosis and staghorn calculus.  Scr continues to climb but he remains asymptomatic.  Will hold IV lasix today due to resolution of lower extremity edema.  S/p nephrostomy tube placement today and will follow UOP and Scr.    Left UPJ obstruction - for left nephrostomy tube placement by IR and Eliquis on hold since 06/01/23.  S/p left nephrostomy tube placement today with purulent urine aspiration which was sent for culture and sensitivity.  Abx per primary svc. Elevated PSA - f/u with Dr. Ronne Binning. Iron deficiency anemia - CKD likely contributing.  Will check iron stores and start ESA. HTN - continue to hold olmesartan in AKI.  Continue with amlodipine.  Irena Cords, MD [facility]

## 2023-06-03 NOTE — Plan of Care (Signed)
  Problem: Education: Goal: Knowledge of General Education information will improve Description: Including pain rating scale, medication(s)/side effects and non-pharmacologic comfort measures Outcome: Progressing   Problem: Health Behavior/Discharge Planning: Goal: Ability to manage health-related needs will improve Outcome: Progressing   Problem: Clinical Measurements: Goal: Ability to maintain clinical measurements within normal limits will improve Outcome: Progressing Goal: Will remain free from infection Outcome: Progressing Goal: Diagnostic test results will improve Outcome: Progressing Goal: Respiratory complications will improve Outcome: Progressing Goal: Cardiovascular complication will be avoided Outcome: Progressing   Problem: Activity: Goal: Risk for activity intolerance will decrease Outcome: Progressing   Problem: Nutrition: Goal: Adequate nutrition will be maintained Outcome: Progressing   Problem: Coping: Goal: Level of anxiety will decrease Outcome: Progressing   Problem: Elimination: Goal: Will not experience complications related to bowel motility Outcome: Progressing Goal: Will not experience complications related to urinary retention Outcome: Progressing   Problem: Pain Managment: Goal: General experience of comfort will improve and/or be controlled Outcome: Progressing   Problem: Safety: Goal: Ability to remain free from injury will improve Outcome: Progressing   Problem: Skin Integrity: Goal: Risk for impaired skin integrity will decrease Outcome: Progressing   Problem: Education: Goal: Knowledge of disease and its progression will improve Outcome: Progressing   Problem: Fluid Volume: Goal: Compliance with measures to maintain balanced fluid volume will improve Outcome: Progressing   Problem: Health Behavior/Discharge Planning: Goal: Ability to manage health-related needs will improve Outcome: Progressing   Problem:  Nutritional: Goal: Ability to make healthy dietary choices will improve Outcome: Progressing   Problem: Clinical Measurements: Goal: Complications related to the disease process, condition or treatment will be avoided or minimized Outcome: Progressing

## 2023-06-03 NOTE — Progress Notes (Signed)
Summary: Calvin Schultz is a 73 y.o. with a PMH of HTN and T2DM and neprholithiasis presenting with concerns of an increased creatinine admitted for an AKI superimposed on CKD.   HD: 4  Subjective:  Patient seen and evaluated while resting in bed. He report he is doing better. No pain. Eating and drinking well. Urinating well. No acute complaints.   Objective:  Vital signs in last 24 hours: Vitals:   06/03/23 0815 06/03/23 0820 06/03/23 0823 06/03/23 0856  BP: (!) 143/85 135/67 138/67 129/62  Pulse: 79 77 75 97  Resp: (!) 22 12 19 16   Temp:    97.8 F (36.6 C)  TempSrc:    Oral  SpO2: 100% 100% 100% 97%  Weight:      Height:       Physical Exam Constitutional:      General: He is not in acute distress. Cardiovascular:     Rate and Rhythm: Normal rate. Rhythm irregular.  Pulmonary:     Effort: Pulmonary effort is normal.     Breath sounds: Normal breath sounds.  Musculoskeletal:     Right lower leg: Edema present.     Left lower leg: Edema present.     Comments: B/l edema significantly improved since admission  Skin:    General: Skin is warm and dry.  Neurological:     Mental Status: He is alert.  Psychiatric:        Mood and Affect: Mood normal.        Behavior: Behavior normal.     Weight change:   Intake/Output Summary (Last 24 hours) at 06/03/2023 1509 Last data filed at 06/03/2023 1055 Gross per 24 hour  Intake 50 ml  Output 1675 ml  Net -1625 ml      Latest Ref Rng & Units 06/03/2023    6:47 AM 06/02/2023    5:52 AM 06/01/2023    5:12 AM  CBC  WBC 4.0 - 10.5 K/uL 11.8  10.7  10.5   Hemoglobin 13.0 - 17.0 g/dL 09.8  8.8  8.2   Hematocrit 39.0 - 52.0 % 32.4  27.2  26.3   Platelets 150 - 400 K/uL 304  250  238       Latest Ref Rng & Units 06/03/2023    6:47 AM 06/02/2023    5:52 AM 06/01/2023    5:12 AM  BMP  Glucose 70 - 99 mg/dL 119  147  829   BUN 8 - 23 mg/dL 54  49  52   Creatinine 0.61 - 1.24 mg/dL 5.62  1.30  8.65   Sodium 135 - 145 mmol/L  135  139  137   Potassium 3.5 - 5.1 mmol/L 3.6  3.6  3.3   Chloride 98 - 111 mmol/L 100  106  107   CO2 22 - 32 mmol/L 20  21  18    Calcium 8.9 - 10.3 mg/dL 9.2  8.9  8.7    Iron: 45 UIBC: 339 TIBC: 384 Saturation Ratios: 12  Mag: 2.4 Phosphorus: 5.9  PT: 14.9 INR: 1.1  IR Nephrostomy Placement Left IMPRESSION: Successful ultrasound and fluoroscopic guided placement of a left sided 10 French percutaneous nephrostomy.  Assessment/Plan:  Principal Problem:   Acute kidney injury superimposed on chronic kidney disease (HCC) Active Problems:   Elevated PSA   Nephrolithiasis   Bilateral lower extremity edema   Chronic atrial fibrillation (HCC)   Anemia   Metabolic acidosis   Abnormal urinalysis   Proteinuria  Calvin Hidden  Schultz is a 72 y.o. with a PMH of HTN and T2DM and neprholithiasis presenting with concerns of an increased creatinine admitted for an AKI superimposed on CKD on HD 4.    AKI superimposed on CKD NAGMA Proteinuria Staghorn calculus (L)- non-obstructing Hx of bilateral staghorn calculi s/p surgery mod to severe hydronephrosis on the left  Nephrolithiasis  Nephrology and Urology following, appreciate recommendations. Creatinine increased to 5.34 from 4.81 yesterday. US showing hydronephrosis on the left. Multiple stones but pt is asymptomatic. CT concerning for L UPJ stricture. Left percutaneous nephrostomy tube placement occurred today and Urology recommends outpatient percutaneous nephrolithotomy. BLE edema significantly improved since admission. Patient urinating well. Nephrology recommended holding lasix given patient's resolution of lower extremity edema. Will observe patient overnight and reassess renal function panel tomorrow morning.  - Appreciate Nephrology - Appreciate Urology  - Recommend nephrology outpatient follow-up - Strict I/O - Renal function panel tomorrow AM   Hypokalemia K+ stable at 3.6. Mag level 2.4 after repletion yesterday.    Elevated  PSA Urology following, recommend repeat PSA in 6 weeks. Appreciate recommendations.  - PSA in 6 weeks outpatient    Iron Deficiency Anemia Hgb 10.2 today and stable. Per chart review, (+) cologuard in 2023. Do not see colonoscopy follow-up. Will likely need one outpatient.    HTN Normotensive overnight. Will continue holding home Olmesartan Medoxomil in setting of AKI. Amlodipine 5mg  started on admission.  - CTM - Continue Amlodipine 5mg    HLD Last lipid panel noted on 07/19/21 WNL.  -Continue Rosuvastatin.   TII DM Glucose 120 this AM.  - CTM - SSI  - Hypoglycemia precautions   A Fib On Eliquis at home. - Continue holding home Eliquis 5 mg BID. Plan to resume tomorrow AM.   Diet: Renal VTE: Holding Eliquis until tomorrow AM; SCD  Fluids: None Code: Full   Dispo: Anticipate discharge in 1 day.   LOS: 3 days   Bubba Hales, Medical Student 06/03/2023, 3:09 PM

## 2023-06-03 NOTE — Procedures (Signed)
Interventional Radiology Procedure Note  Procedure: Image guided left percutaneous nephrostomy placement.  Findings: Please refer to procedural dictation for full description. 10 Fr left nephrostomy, posterior lower pole access.  Purulent urine aspirate, sent for culture.  Complications: None immediate  Estimated Blood Loss: < 5 ml  Recommendations: Follow up culture. IR will follow.   Marliss Coots, MD

## 2023-06-04 DIAGNOSIS — N189 Chronic kidney disease, unspecified: Secondary | ICD-10-CM | POA: Diagnosis not present

## 2023-06-04 DIAGNOSIS — I482 Chronic atrial fibrillation, unspecified: Secondary | ICD-10-CM | POA: Diagnosis not present

## 2023-06-04 DIAGNOSIS — N179 Acute kidney failure, unspecified: Secondary | ICD-10-CM | POA: Diagnosis not present

## 2023-06-04 DIAGNOSIS — N200 Calculus of kidney: Secondary | ICD-10-CM | POA: Diagnosis not present

## 2023-06-04 LAB — CBC WITH DIFFERENTIAL/PLATELET
Abs Immature Granulocytes: 0.05 10*3/uL (ref 0.00–0.07)
Basophils Absolute: 0.1 10*3/uL (ref 0.0–0.1)
Basophils Relative: 0 %
Eosinophils Absolute: 0.3 10*3/uL (ref 0.0–0.5)
Eosinophils Relative: 2 %
HCT: 29.3 % — ABNORMAL LOW (ref 39.0–52.0)
Hemoglobin: 9.1 g/dL — ABNORMAL LOW (ref 13.0–17.0)
Immature Granulocytes: 0 %
Lymphocytes Relative: 9 %
Lymphs Abs: 1.1 10*3/uL (ref 0.7–4.0)
MCH: 26.8 pg (ref 26.0–34.0)
MCHC: 31.1 g/dL (ref 30.0–36.0)
MCV: 86.4 fL (ref 80.0–100.0)
Monocytes Absolute: 1.3 10*3/uL — ABNORMAL HIGH (ref 0.1–1.0)
Monocytes Relative: 11 %
Neutro Abs: 9.6 10*3/uL — ABNORMAL HIGH (ref 1.7–7.7)
Neutrophils Relative %: 78 %
Platelets: 261 10*3/uL (ref 150–400)
RBC: 3.39 MIL/uL — ABNORMAL LOW (ref 4.22–5.81)
RDW: 13.9 % (ref 11.5–15.5)
WBC: 12.3 10*3/uL — ABNORMAL HIGH (ref 4.0–10.5)
nRBC: 0 % (ref 0.0–0.2)

## 2023-06-04 LAB — RENAL FUNCTION PANEL
Albumin: 2.8 g/dL — ABNORMAL LOW (ref 3.5–5.0)
Anion gap: 9 (ref 5–15)
BUN: 53 mg/dL — ABNORMAL HIGH (ref 8–23)
CO2: 22 mmol/L (ref 22–32)
Calcium: 8.7 mg/dL — ABNORMAL LOW (ref 8.9–10.3)
Chloride: 106 mmol/L (ref 98–111)
Creatinine, Ser: 5.24 mg/dL — ABNORMAL HIGH (ref 0.61–1.24)
GFR, Estimated: 11 mL/min — ABNORMAL LOW (ref >60)
Glucose, Bld: 121 mg/dL — ABNORMAL HIGH (ref 70–99)
Phosphorus: 6 mg/dL — ABNORMAL HIGH (ref 2.5–4.6)
Potassium: 3.5 mmol/L (ref 3.5–5.1)
Sodium: 137 mmol/L (ref 135–145)

## 2023-06-04 LAB — FERRITIN: Ferritin: 32 ng/mL (ref 24–336)

## 2023-06-04 MED ORDER — IRON SUCROSE 200 MG IVPB - SIMPLE MED
200.0000 mg | Freq: Once | Status: AC
  Administered 2023-06-04: 200 mg via INTRAVENOUS
  Filled 2023-06-04: qty 200

## 2023-06-04 MED ORDER — ACETAMINOPHEN 500 MG PO TABS
1000.0000 mg | ORAL_TABLET | Freq: Four times a day (QID) | ORAL | Status: DC | PRN
  Administered 2023-06-04 – 2023-06-05 (×3): 1000 mg via ORAL
  Filled 2023-06-04 (×3): qty 2

## 2023-06-04 MED ORDER — SODIUM CHLORIDE 0.9 % IV SOLN
1.0000 g | INTRAVENOUS | Status: DC
  Administered 2023-06-04: 1 g via INTRAVENOUS
  Filled 2023-06-04 (×3): qty 10

## 2023-06-04 NOTE — Progress Notes (Addendum)
Summary: Rease Wence is a 73 y.o. with a PMH of HTN and T2DM and neprholithiasis presenting with concerns of an increased creatinine admitted for an AKI superimposed on CKD.   HD: 5  Subjective:  Patient seen and evaluated while resting in bed. Patient reports he is doing well. He denies pain, endorses eating and drinking well and endorses urinating well. No acute complaints.   Objective:  Vital signs in last 24 hours: Vitals:   06/03/23 0856 06/03/23 2051 06/04/23 0429 06/04/23 0808  BP: 129/62 135/70 128/72 (!) 95/59  Pulse: 97 65 78 (!) 57  Resp: 16 18 17 18   Temp: 97.8 F (36.6 C) 98 F (36.7 C) 98 F (36.7 C) 98.7 F (37.1 C)  TempSrc: Oral Oral Oral Oral  SpO2: 97% 97% 98% 100%  Weight:      Height:       Physical Exam Cardiovascular:     Rate and Rhythm: Normal rate. Rhythm irregular.     Heart sounds: No murmur heard.    No friction rub. No gallop.  Pulmonary:     Effort: Pulmonary effort is normal.     Breath sounds: Normal breath sounds.  Abdominal:     General: Bowel sounds are normal.     Palpations: Abdomen is soft.  Musculoskeletal:     Right lower leg: Edema present.     Left lower leg: Edema present.     Comments: B/L edema appears same as yesterday; significantly improved since admission  Neurological:     Mental Status: He is alert.     Weight change:   Intake/Output Summary (Last 24 hours) at 06/04/2023 1508 Last data filed at 06/04/2023 0438 Gross per 24 hour  Intake 10 ml  Output 775 ml  Net -765 ml      Latest Ref Rng & Units 06/04/2023    5:03 AM 06/03/2023    6:47 AM 06/02/2023    5:52 AM  CBC  WBC 4.0 - 10.5 K/uL 12.3  11.8  10.7   Hemoglobin 13.0 - 17.0 g/dL 9.1  81.1  8.8   Hematocrit 39.0 - 52.0 % 29.3  32.4  27.2   Platelets 150 - 400 K/uL 261  304  250       Latest Ref Rng & Units 06/04/2023    5:03 AM 06/03/2023    6:47 AM 06/02/2023    5:52 AM  BMP  Glucose 70 - 99 mg/dL 914  782  956   BUN 8 - 23 mg/dL 53  54  49    Creatinine 0.61 - 1.24 mg/dL 2.13  0.86  5.78   Sodium 135 - 145 mmol/L 137  135  139   Potassium 3.5 - 5.1 mmol/L 3.5  3.6  3.6   Chloride 98 - 111 mmol/L 106  100  106   CO2 22 - 32 mmol/L 22  20  21    Calcium 8.9 - 10.3 mg/dL 8.7  9.2  8.9    Aerobic/Anaerobic Culture w/ Gram Stain (surgical/deep wound): MODERATE WBC PRESENT, PREDOMINANTLY PMN  RARE GRAM POSITIVE COCCI IN CLUSTERS   Assessment/Plan:  Principal Problem:   Acute kidney injury superimposed on chronic kidney disease (HCC) Active Problems:   Elevated PSA   Nephrolithiasis   Bilateral lower extremity edema   Chronic atrial fibrillation (HCC)   Anemia   Metabolic acidosis   Abnormal urinalysis   Proteinuria  Yida Hyams is a 73 y.o. with a PMH of HTN and T2DM and neprholithiasis  presenting with concerns of an increased creatinine admitted for an AKI superimposed on CKD on HD 5.    AKI superimposed on CKD NAGMA Proteinuria Staghorn calculus (L)- non-obstructing Hx of bilateral staghorn calculi s/p surgery mod to severe hydronephrosis on the left  Nephrolithiasis  UTI Nephrology and Urology following, appreciate recommendations. Creatinine decreased to 5.24 from 5.34 yesterday. S/p left percutaneous nephrostomy tube placement POD 1. Report of purulent fluid draining from tube yesterday. Culture grew moderate WBC, predominately PMN with rare gram (+) cocci in clusters. Patient afebrile and endorses no pain. Appears asymptomatic. Team discussed with pharmacy about appropriate antibiotic coverage. Will continue ceftriaxone for now. BLE edema stable since yesterday with much improved since admission. Patient continues to be urinating well without lasix. Plan to trend creatinine and follow culture. Once specific bacteria is identified, will determine best bacterial coverage.  - Appreciate Nephrology - Appreciate Urology  - Recommend nephrology outpatient follow-up - Renal function panel tomorrow AM    Hypokalemia Stable. K+ 3.5.   Elevated PSA Urology following, recommend repeat PSA in 6 weeks. Appreciate recommendations.  - PSA in 6 weeks outpatient    Iron Deficiency Anemia Hgb 9.1 today from 10.2 yesterday. Per chart review, (+) cologuard in 2023. Do not see colonoscopy follow-up. Will likely need one outpatient.  - CBC tomorrow AM   HTN Normotensive overnight with episode of slight hypotension this AM. Will continue holding home Olmesartan Medoxomil in setting of AKI. Amlodipine 5mg  started on admission.  - CTM - Continue Amlodipine 5mg    HLD Last lipid panel noted on 07/19/21 WNL.  -Continue Rosuvastatin.   TII DM Glucose 121 this AM.  - CTM - SSI  - Hypoglycemia precautions   A Fib On Eliquis at home. Eliquis held for procedure yesterday. Will plan to restart this AM - Continue home Eliquis 5 mg BID   Diet: Renal VTE: Eliquis Fluids: None Code: Full   Dispo: Anticipate discharge in 1 day.   LOS: 4 days   Bubba Hales, Medical Student 06/04/2023, 3:08 PM

## 2023-06-04 NOTE — Plan of Care (Signed)
  Problem: Education: Goal: Knowledge of General Education information will improve Description: Including pain rating scale, medication(s)/side effects and non-pharmacologic comfort measures Outcome: Progressing   Problem: Health Behavior/Discharge Planning: Goal: Ability to manage health-related needs will improve Outcome: Progressing   Problem: Clinical Measurements: Goal: Ability to maintain clinical measurements within normal limits will improve Outcome: Progressing Goal: Will remain free from infection Outcome: Progressing Goal: Diagnostic test results will improve Outcome: Progressing Goal: Respiratory complications will improve Outcome: Progressing Goal: Cardiovascular complication will be avoided Outcome: Progressing   Problem: Activity: Goal: Risk for activity intolerance will decrease Outcome: Progressing   Problem: Nutrition: Goal: Adequate nutrition will be maintained Outcome: Progressing   Problem: Coping: Goal: Level of anxiety will decrease Outcome: Progressing   Problem: Elimination: Goal: Will not experience complications related to bowel motility Outcome: Progressing Goal: Will not experience complications related to urinary retention Outcome: Progressing   Problem: Pain Managment: Goal: General experience of comfort will improve and/or be controlled Outcome: Progressing   Problem: Safety: Goal: Ability to remain free from injury will improve Outcome: Progressing   Problem: Skin Integrity: Goal: Risk for impaired skin integrity will decrease Outcome: Progressing   Problem: Education: Goal: Knowledge of disease and its progression will improve Outcome: Progressing   Problem: Fluid Volume: Goal: Compliance with measures to maintain balanced fluid volume will improve Outcome: Progressing   Problem: Health Behavior/Discharge Planning: Goal: Ability to manage health-related needs will improve Outcome: Progressing   Problem:  Nutritional: Goal: Ability to make healthy dietary choices will improve Outcome: Progressing   Problem: Clinical Measurements: Goal: Complications related to the disease process, condition or treatment will be avoided or minimized Outcome: Progressing

## 2023-06-04 NOTE — Progress Notes (Signed)
Patient ID: Aaran Enberg, male   DOB: 25-Jan-1951, 73 y.o.   MRN: [id number] S: Feels better. O:BP (!) 95/59 (BP Location: Left Arm)   Pulse (!) 57   Temp 98.7 F (37.1 C) (Oral)   Resp 18   Ht 5\' 9"  (1.753 m)   Wt 101.6 kg   SpO2 100%   BMI 33.08 kg/m   Intake/Output Summary (Last 24 hours) at 06/04/2023 1252 Last data filed at 06/04/2023 0438 Gross per 24 hour  Intake 10 ml  Output 1125 ml  Net -1115 ml   Intake/Output: I/O last 3 completed shifts: In: 10 [Other:10] Out: 1500 [Urine:1500]  Intake/Output this shift:  No intake/output data recorded. Weight change:  Gen: NAD CVS: bradycardia Resp:CTA Abd:+BS, soft, NT/ND Ext: no edema  Recent Labs  Lab 05/30/23 0856 05/31/23 0542 06/01/23 0512 06/02/23 0552 06/03/23 0647 06/04/23 0503  NA 140 140 137 139 135 137  K 3.8 3.5 3.3* 3.6 3.6 3.5  CL 111 111 107 106 100 106  CO2 15* 16* 18* 21* 20* 22  GLUCOSE 128* 77 121* 108* 120* 121*  BUN 55* 53* 52* 49* 54* 53*  CREATININE 4.46* 4.43* 4.71* 4.81* 5.34* 5.24*  ALBUMIN 2.9*  --  2.7* 2.8* 3.3* 2.8*  CALCIUM 8.8* 9.2 8.7* 8.9 9.2 8.7*  PHOS 5.8*  --  5.9* 5.4* 5.9* 6.0*  AST 14*  --   --   --   --   --   ALT 16  --   --   --   --   --    Liver Function Tests: Recent Labs  Lab 05/30/23 0856 06/01/23 0512 06/02/23 0552 06/03/23 0647 06/04/23 0503  AST 14*  --   --   --   --   ALT 16  --   --   --   --   ALKPHOS 101  --   --   --   --   BILITOT 0.6  --   --   --   --   PROT 6.1*  --   --   --   --   ALBUMIN 2.9*   < > 2.8* 3.3* 2.8*   < > = values in this interval not displayed.   No results for input(s): "LIPASE", "AMYLASE" in the last 168 hours. No results for input(s): "AMMONIA" in the last 168 hours. CBC: Recent Labs  Lab 05/30/23 0856 05/31/23 0542 06/01/23 0512 06/02/23 0552 06/03/23 0647 06/04/23 0503  WBC 12.0* 12.1* 10.5 10.7* 11.8* 12.3*  NEUTROABS 9.9*  --   --   --   --  9.6*  HGB 8.0* 8.5* 8.2* 8.8* 10.2* 9.1*  HCT 26.4* 28.2* 26.3*  27.2* 32.4* 29.3*  MCV 89.2 89.0 85.9 85.3 85.5 86.4  PLT 221 260 238 250 304 261   Cardiac Enzymes: No results for input(s): "CKTOTAL", "CKMB", "CKMBINDEX", "TROPONINI" in the last 168 hours. CBG: No results for input(s): "GLUCAP" in the last 168 hours.  Iron Studies:  Recent Labs    06/03/23 0647 06/04/23 1126  IRON 45  --   TIBC 384  --   FERRITIN  --  32   Studies/Results: IR NEPHROSTOMY PLACEMENT LEFT Result Date: 06/03/2023 INDICATION: 73 year old male with history of obstructive left nephrolithiasis. EXAM: 1. ULTRASOUND GUIDANCE FOR PUNCTURE OF THE LEFT RENAL COLLECTING SYSTEM 2. LEFT PERCUTANEOUS NEPHROSTOMY TUBE PLACEMENT. COMPARISON:  None Available. MEDICATIONS: The patient was receiving intravenous antibiotics as an inpatient. No additional antibiotics were administered. ANESTHESIA/SEDATION: Moderate (  conscious) sedation was employed during this procedure. A total of Versed 1.5 mg and Fentanyl 75 mcg was administered intravenously. Moderate Sedation Time: 12 minutes. The patient's level of consciousness and vital signs were monitored continuously by radiology nursing throughout the procedure under my direct supervision. CONTRAST:  10 mL Omnipque 300 - administered into the renal collecting system FLUOROSCOPY TIME:  31 mGy COMPLICATIONS: None immediate. PROCEDURE: The procedure, risks, benefits, and alternatives were explained to the patient. Questions regarding the procedure were encouraged and answered. The patient understands and consents to the procedure. A timeout was performed prior to the initiation of the procedure. The left flank region was prepped and draped in the usual sterile fashion and a sterile drape was applied covering the operative field. A sterile gown and sterile gloves were used for the procedure. Local anesthesia was provided with 1% Lidocaine with epinephrine. Ultrasound was used to localize the left kidney. Under direct ultrasound guidance, a 20 gauge needle  was advanced into the renal collecting system. An ultrasound image documentation was performed. Access within the collecting system was confirmed with the efflux of purulent urine. Over a Nitrex wire, the tract was dilated with an Accustick stent. Next, under intermittent fluoroscopic guidance and over a short Amplatz wire, the track was dilated ultimately allowing placement of a 10-French percutaneous nephrostomy catheter which was advanced to the level of the renal pelvis where the coil was formed and locked. Contrast was injected and several spot fluoroscopic images were obtained in various obliquities. The catheter was secured at the skin with a Prolene retention suture and stat lock device and connected to a gravity bag was placed. Dressings were applied. The patient tolerated procedure well without immediate postprocedural complication. FINDINGS: Ultrasound scanning demonstrates a moderate to severely dilated left collecting system. Under a combination of ultrasound and fluoroscopic guidance, a posterior inferior calix was targeted allowing placement of a 10-French percutaneous nephrostomy catheter with end coiled and locked within the renal pelvis. Contrast injection confirmed appropriate positioning. Purulent urine sample was sent for culture. IMPRESSION: Successful ultrasound and fluoroscopic guided placement of a left sided 10 French percutaneous nephrostomy. Marliss Coots, MD [facility] Electronically Signed   By: Marliss Coots M.D.   On: 06/03/2023 09:29    amLODipine  5 mg Oral Daily   apixaban  5 mg Oral BID   darbepoetin (ARANESP) injection - NON-DIALYSIS  40 mcg Subcutaneous Q Tue-1800   rosuvastatin  40 mg Oral Daily   sevelamer carbonate  800 mg Oral TID WC   sodium chloride flush  5 mL Intracatheter Q8H    BMET    Component Value Date/Time   NA 137 06/04/2023 0503   NA 139 06/25/2021 1539   K 3.5 06/04/2023 0503   CL 106  06/04/2023 0503   CO2 22 06/04/2023 0503   GLUCOSE 121 (H) 06/04/2023 0503   BUN 53 (H) 06/04/2023 0503   BUN 22 06/25/2021 1539   CREATININE 5.24 (H) 06/04/2023 0503   CALCIUM 8.7 (L) 06/04/2023 0503   GFRNONAA 11 (L) 06/04/2023 0503   CBC    Component Value Date/Time   WBC 12.3 (H) 06/04/2023 0503   RBC 3.39 (L) 06/04/2023 0503   HGB 9.1 (L) 06/04/2023 0503   HGB 13.2 06/25/2021 1539   HCT 29.3 (L) 06/04/2023 0503   HCT 41.2 06/25/2021 1539   PLT 261 06/04/2023 0503   PLT 367 06/25/2021 1539   MCV 86.4 06/04/2023 0503   MCV 84 06/25/2021  1539   MCH 26.8 06/04/2023 0503   MCHC 31.1 06/04/2023 0503   RDW 13.9 06/04/2023 0503   RDW 12.6 06/25/2021 1539   LYMPHSABS 1.1 06/04/2023 0503   LYMPHSABS 2.4 06/25/2021 1539   MONOABS 1.3 (H) 06/04/2023 0503   EOSABS 0.3 06/04/2023 0503   EOSABS 0.2 06/25/2021 1539   BASOSABS 0.1 06/04/2023 0503   BASOSABS 0.1 06/25/2021 1539     Assessment/Plan:   AKI/CKD stage IV vs progressive disease - no labs for the past year and a half.  Renal US and CT with left sided hydronephrosis and staghorn calculus.  Scr peaked at  5.34 but since PNT, it has started to improve slightly to 5.24.  Will hold IV lasix today due to resolution of lower extremity edema.  S/p nephrostomy tube placement 06/03/23 and will  continue to follow UOP and Scr.  Hopefully Scr will continue to improve.  We did discuss that he does have underlying CKD and will need to follow up with me in our office after discharge.  He understands and agrees to follow up.   Left UPJ obstruction - for left nephrostomy tube placement by IR and Eliquis on hold since 06/01/23.  S/p left nephrostomy tube placement today with purulent urine aspiration which was sent for culture and sensitivity.  Preliminary results rare GPC in clusters.  Abx per primary svc. Elevated PSA - f/u with Dr. Ronne Binning. Iron deficiency anemia - CKD likely contributing.  Iron stores low and will dose with IV Iron and  started ESA. HTN - continue to hold olmesartan in AKI.  Continue with amlodipine.  Irena Cords, MD [facility], [place]

## 2023-06-05 ENCOUNTER — Other Ambulatory Visit (HOSPITAL_COMMUNITY): Payer: Self-pay

## 2023-06-05 DIAGNOSIS — N179 Acute kidney failure, unspecified: Secondary | ICD-10-CM | POA: Diagnosis not present

## 2023-06-05 DIAGNOSIS — N200 Calculus of kidney: Secondary | ICD-10-CM | POA: Diagnosis not present

## 2023-06-05 DIAGNOSIS — N189 Chronic kidney disease, unspecified: Secondary | ICD-10-CM | POA: Diagnosis not present

## 2023-06-05 LAB — RENAL FUNCTION PANEL
Albumin: 3.1 g/dL — ABNORMAL LOW (ref 3.5–5.0)
Anion gap: 10 (ref 5–15)
BUN: 48 mg/dL — ABNORMAL HIGH (ref 8–23)
CO2: 20 mmol/L — ABNORMAL LOW (ref 22–32)
Calcium: 8.9 mg/dL (ref 8.9–10.3)
Chloride: 105 mmol/L (ref 98–111)
Creatinine, Ser: 4.94 mg/dL — ABNORMAL HIGH (ref 0.61–1.24)
GFR, Estimated: 12 mL/min — ABNORMAL LOW (ref >60)
Glucose, Bld: 110 mg/dL — ABNORMAL HIGH (ref 70–99)
Phosphorus: 5.6 mg/dL — ABNORMAL HIGH (ref 2.5–4.6)
Potassium: 3.7 mmol/L (ref 3.5–5.1)
Sodium: 135 mmol/L (ref 135–145)

## 2023-06-05 LAB — CBC
HCT: 32.4 % — ABNORMAL LOW (ref 39.0–52.0)
Hemoglobin: 10.2 g/dL — ABNORMAL LOW (ref 13.0–17.0)
MCH: 27 pg (ref 26.0–34.0)
MCHC: 31.5 g/dL (ref 30.0–36.0)
MCV: 85.7 fL (ref 80.0–100.0)
Platelets: 309 10*3/uL (ref 150–400)
RBC: 3.78 MIL/uL — ABNORMAL LOW (ref 4.22–5.81)
RDW: 13.9 % (ref 11.5–15.5)
WBC: 12 10*3/uL — ABNORMAL HIGH (ref 4.0–10.5)
nRBC: 0 % (ref 0.0–0.2)

## 2023-06-05 MED ORDER — SEVELAMER CARBONATE 800 MG PO TABS
800.0000 mg | ORAL_TABLET | Freq: Three times a day (TID) | ORAL | 0 refills | Status: DC
  Filled 2023-06-05: qty 30, 10d supply, fill #0

## 2023-06-05 MED ORDER — AMLODIPINE BESYLATE 5 MG PO TABS
5.0000 mg | ORAL_TABLET | Freq: Every day | ORAL | 0 refills | Status: DC
  Filled 2023-06-05: qty 30, 30d supply, fill #0

## 2023-06-05 MED ORDER — SULFAMETHOXAZOLE-TRIMETHOPRIM 800-160 MG PO TABS
1.0000 | ORAL_TABLET | Freq: Two times a day (BID) | ORAL | 0 refills | Status: AC
  Filled 2023-06-05: qty 30, 15d supply, fill #0

## 2023-06-05 NOTE — Progress Notes (Signed)
Referring Physician(s): Elmon Kirschner, NP  Supervising Physician: Gilmer Mor  Patient Status:  Calvin Schultz - In-pt  Chief Complaint: Left renal calculi with hydronephrosis s/p Left PCN in IR 06/02/23  Subjective: Patient resting comfortably in bed watching TV. He is scheduled for discharge home today.   Allergies: Patient has no known allergies.  Medications: Prior to Admission medications   Medication Sig Start Date End Date Taking? Authorizing Provider  ELIQUIS 5 MG TABS tablet Take 5 mg by mouth 2 (two) times daily. 08/24/21  Yes [provider]  LEVEMIR FLEXTOUCH 100 UNIT/ML FlexTouch Pen Inject 20 Units into the skin at bedtime. 06/27/21  Yes [provider]  metoprolol succinate (TOPROL-XL) 25 MG 24 hr tablet TAKE 1 TABLET BY MOUTH EVERY DAY IN THE MORNING 05/26/22  Yes Tolia, Sunit, DO  rosuvastatin (CRESTOR) 40 MG tablet Take 40 mg by mouth daily. 08/27/21  Yes [provider]     Vital Signs: BP (!) 148/55 (BP Location: Left Arm)   Pulse (!) 103   Temp 97.8 F (36.6 C) (Oral)   Resp 18   Ht 5\' 9"  (1.753 m)   Wt 223 lb 15.8 oz (101.6 kg)   SpO2 100%   BMI 33.08 kg/m   Physical Exam Constitutional:      General: He is not in acute distress.    Appearance: He is not ill-appearing.  Pulmonary:     Effort: Pulmonary effort is normal.  Abdominal:     Comments: Left PCN with thin, clear, blood-tinged urine in bag. Dressing is clean/dry/intact. Site is non-tender.   Skin:    General: Skin is warm and dry.  Neurological:     Mental Status: He is alert and oriented to person, place, and time.  Psychiatric:        Mood and Affect: Mood normal.        Behavior: Behavior normal.        Thought Content: Thought content normal.        Judgment: Judgment normal.     Imaging: IR NEPHROSTOMY PLACEMENT LEFT Result Date: 06/03/2023 INDICATION: 73 year old male with history of obstructive left nephrolithiasis. EXAM: 1. ULTRASOUND GUIDANCE FOR  PUNCTURE OF THE LEFT RENAL COLLECTING SYSTEM 2. LEFT PERCUTANEOUS NEPHROSTOMY TUBE PLACEMENT. COMPARISON:  None Available. MEDICATIONS: The patient was receiving intravenous antibiotics as an inpatient. No additional antibiotics were administered. ANESTHESIA/SEDATION: Moderate (conscious) sedation was employed during this procedure. A total of Versed 1.5 mg and Fentanyl 75 mcg was administered intravenously. Moderate Sedation Time: 12 minutes. The patient's level of consciousness and vital signs were monitored continuously by radiology nursing throughout the procedure under my direct supervision. CONTRAST:  10 mL Omnipque 300 - administered into the renal collecting system FLUOROSCOPY TIME:  31 mGy COMPLICATIONS: None immediate. PROCEDURE: The procedure, risks, benefits, and alternatives were explained to the patient. Questions regarding the procedure were encouraged and answered. The patient understands and consents to the procedure. A timeout was performed prior to the initiation of the procedure. The left flank region was prepped and draped in the usual sterile fashion and a sterile drape was applied covering the operative field. A sterile gown and sterile gloves were used for the procedure. Local anesthesia was provided with 1% Lidocaine with epinephrine. Ultrasound was used to localize the left kidney. Under direct ultrasound guidance, a 20 gauge needle was advanced into the renal collecting system. An ultrasound image documentation was performed. Access within the collecting system was confirmed with the efflux of  purulent urine. Over a Nitrex wire, the tract was dilated with an Accustick stent. Next, under intermittent fluoroscopic guidance and over a short Amplatz wire, the track was dilated ultimately allowing placement of a 10-French percutaneous nephrostomy catheter which was advanced to the level of the renal pelvis where the coil was formed and locked. Contrast was injected and several spot fluoroscopic  images were obtained in various obliquities. The catheter was secured at the skin with a Prolene retention suture and stat lock device and connected to a gravity bag was placed. Dressings were applied. The patient tolerated procedure well without immediate postprocedural complication. FINDINGS: Ultrasound scanning demonstrates a moderate to severely dilated left collecting system. Under a combination of ultrasound and fluoroscopic guidance, a posterior inferior calix was targeted allowing placement of a 10-French percutaneous nephrostomy catheter with end coiled and locked within the renal pelvis. Contrast injection confirmed appropriate positioning. Purulent urine sample was sent for culture. IMPRESSION: Successful ultrasound and fluoroscopic guided placement of a left sided 10 French percutaneous nephrostomy. Marliss Coots, MD [facility] Electronically Signed   By: Marliss Coots M.D.   On: 06/03/2023 09:29    Labs:  CBC: Recent Labs    06/02/23 0552 06/03/23 0647 06/04/23 0503 06/05/23 0608  WBC 10.7* 11.8* 12.3* 12.0*  HGB 8.8* 10.2* 9.1* 10.2*  HCT 27.2* 32.4* 29.3* 32.4*  PLT 250 304 261 309    COAGS: Recent Labs    06/03/23 0647  INR 1.1    BMP: Recent Labs    06/02/23 0552 06/03/23 0647 06/04/23 0503 06/05/23 0608  NA 139 135 137 135  K 3.6 3.6 3.5 3.7  CL 106 100 106 105  CO2 21* 20* 22 20*  GLUCOSE 108* 120* 121* 110*  BUN 49* 54* 53* 48*  CALCIUM 8.9 9.2 8.7* 8.9  CREATININE 4.81* 5.34* 5.24* 4.94*  GFRNONAA 12* 11* 11* 12*    LIVER FUNCTION TESTS: Recent Labs    05/30/23 0856 06/01/23 0512 06/02/23 0552 06/03/23 0647 06/04/23 0503 06/05/23 0608  BILITOT 0.6  --   --   --   --   --   AST 14*  --   --   --   --   --   ALT 16  --   --   --   --   --   ALKPHOS 101  --   --   --   --   --   PROT 6.1*  --   --   --   --   --   ALBUMIN 2.9*   < > 2.8* 3.3* 2.8* 3.1*   < > = values in this interval  not displayed.    Assessment and Plan:  Left renal calculi with hydronephrosis s/p Left PCN in IR 06/02/23  Patient with tentative plans for discharge home today. Patient states Urology is planning for stone removal sometime next week. Patient aware Urology will determine timing of PCN removal but if the PCN is still in place in 4-6 weeks he will need a routine exchange. A scheduler from our office will call him with a date/time of his appointment. Patient will cancel the appointment if Urology removes the drain. We discussed drain care including emptying the gravity bag, changing the dressing and keeping the site clean and dry. Patient knows to call IR if he has any drain related issues.   Electronically Signed: Alwyn Ren, AGACNP-BC 06/05/2023, 10:15 AM   I spent a total of 15  Minutes at the the patient's bedside AND on the patient's hospital floor or unit, greater than 50% of which was counseling/coordinating care for Left PCN

## 2023-06-05 NOTE — Progress Notes (Signed)
Patient ID: Calvin Schultz, male   DOB: May 22, 1950, 73 y.o.   MRN: [id number]   S: Feels better. 800 of UOP - walking around in room-  crt again slightly better   O:BP (!) 148/55 (BP Location: Left Arm)   Pulse (!) 103   Temp 97.8 F (36.6 C) (Oral)   Resp 18   Ht 5\' 9"  (1.753 m)   Wt 101.6 kg   SpO2 100%   BMI 33.08 kg/m   Intake/Output Summary (Last 24 hours) at 06/05/2023 1129 Last data filed at 06/05/2023 1000 Gross per 24 hour  Intake 370 ml  Output 1000 ml  Net -630 ml   Intake/Output: I/O last 3 completed shifts: In: 20 [Other:20] Out: 1250 [Urine:1250]  Intake/Output this shift:  Total I/O In: 360 [P.O.:360] Out: 200 [Urine:200] Weight change:  Gen: NAD CVS: bradycardia Resp:CTA Abd:+BS, soft, NT/ND Ext: min edema  Recent Labs  Lab 05/30/23 0856 05/31/23 0542 06/01/23 0512 06/02/23 0552 06/03/23 0647 06/04/23 0503 06/05/23 0608  NA 140 140 137 139 135 137 135  K 3.8 3.5 3.3* 3.6 3.6 3.5 3.7  CL 111 111 107 106 100 106 105  CO2 15* 16* 18* 21* 20* 22 20*  GLUCOSE 128* 77 121* 108* 120* 121* 110*  BUN 55* 53* 52* 49* 54* 53* 48*  CREATININE 4.46* 4.43* 4.71* 4.81* 5.34* 5.24* 4.94*  ALBUMIN 2.9*  --  2.7* 2.8* 3.3* 2.8* 3.1*  CALCIUM 8.8* 9.2 8.7* 8.9 9.2 8.7* 8.9  PHOS 5.8*  --  5.9* 5.4* 5.9* 6.0* 5.6*  AST 14*  --   --   --   --   --   --   ALT 16  --   --   --   --   --   --    Liver Function Tests: Recent Labs  Lab 05/30/23 0856 06/01/23 0512 06/03/23 0647 06/04/23 0503 06/05/23 0608  AST 14*  --   --   --   --   ALT 16  --   --   --   --   ALKPHOS 101  --   --   --   --   BILITOT 0.6  --   --   --   --   PROT 6.1*  --   --   --   --   ALBUMIN 2.9*   < > 3.3* 2.8* 3.1*   < > = values in this interval not displayed.   No results for input(s): "LIPASE", "AMYLASE" in the last 168 hours. No results for input(s): "AMMONIA" in the last 168 hours. CBC: Recent Labs  Lab 05/30/23 0856 05/31/23 0542 06/01/23 0512 06/02/23 0552  06/03/23 0647 06/04/23 0503 06/05/23 0608  WBC 12.0*   < > 10.5 10.7* 11.8* 12.3* 12.0*  NEUTROABS 9.9*  --   --   --   --  9.6*  --   HGB 8.0*   < > 8.2* 8.8* 10.2* 9.1* 10.2*  HCT 26.4*   < > 26.3* 27.2* 32.4* 29.3* 32.4*  MCV 89.2   < > 85.9 85.3 85.5 86.4 85.7  PLT 221   < > 238 250 304 261 309   < > = values in this interval not displayed.   Cardiac Enzymes: No results for input(s): "CKTOTAL", "CKMB", "CKMBINDEX", "TROPONINI" in the last 168 hours. CBG: No results for input(s): "GLUCAP" in the last 168 hours.  Iron Studies:  Recent Labs    06/03/23 0647 06/04/23 1126  IRON 45  --  TIBC 384  --   FERRITIN  --  32   Studies/Results: No results found.   amLODipine  5 mg Oral Daily   apixaban  5 mg Oral BID   darbepoetin (ARANESP) injection - NON-DIALYSIS  40 mcg Subcutaneous Q Tue-1800   rosuvastatin  40 mg Oral Daily   sevelamer carbonate  800 mg Oral TID WC   sodium chloride flush  5 mL Intracatheter Q8H    BMET    Component Value Date/Time   NA 135 06/05/2023 0608   NA 139 06/25/2021 1539   K 3.7 06/05/2023 0608   CL 105 06/05/2023 0608   CO2 20 (L) 06/05/2023 0608   GLUCOSE 110 (H) 06/05/2023 0608   BUN 48 (H) 06/05/2023 0608   BUN 22 06/25/2021 1539   CREATININE 4.94 (H) 06/05/2023 0608   CALCIUM 8.9 06/05/2023 0608   GFRNONAA 12 (L) 06/05/2023 0608   CBC    Component Value Date/Time   WBC 12.0 (H) 06/05/2023 0608   RBC 3.78 (L) 06/05/2023 0608   HGB 10.2 (L) 06/05/2023 0608   HGB 13.2 06/25/2021 1539   HCT 32.4 (L) 06/05/2023 0608   HCT 41.2 06/25/2021 1539   PLT 309 06/05/2023 0608   PLT 367 06/25/2021 1539   MCV 85.7 06/05/2023 0608   MCV 84 06/25/2021 1539   MCH 27.0 06/05/2023 0608   MCHC 31.5 06/05/2023 0608   RDW 13.9 06/05/2023 0608   RDW 12.6 06/25/2021 1539   LYMPHSABS 1.1 06/04/2023 0503   LYMPHSABS 2.4 06/25/2021 1539   MONOABS 1.3 (H) 06/04/2023 0503   EOSABS 0.3 06/04/2023 0503   EOSABS 0.2 06/25/2021 1539   BASOSABS 0.1  06/04/2023 0503   BASOSABS 0.1 06/25/2021 1539     Assessment/Plan:   AKI/CKD stage IV vs progressive disease - no labs for the past year and a half.  Renal US and CT with left sided hydronephrosis and staghorn calculus.  Scr peaked at  5.34 but since PNT, it has started to improve slightly to 5.24-  4.9 today.  S/p nephrostomy tube placement 06/03/23 and will  continue to follow UOP and Scr.  Hopefully Scr will continue to improve- is down slightly again today .  We did discuss that he does have underlying CKD and will need to follow up with me in our office after discharge.  He understands and agrees to follow up.   Left UPJ obstruction - for left nephrostomy tube placement by IR and Eliquis on hold since 06/01/23.  S/p left nephrostomy tube placement today with purulent urine aspiration which was sent for culture and sensitivity.  Preliminary results rare GPC in clusters.  Abx per primary svc. Elevated PSA - f/u with Dr. Ronne Binning. Iron deficiency anemia - CKD likely contributing.  Iron stores low and will dose with IV Iron and started ESA. HTN - continue to hold olmesartan in AKI.  Continue with amlodipine.  Dispo-  pt is eager for discharge-  crt has trended down since PCN.   He looks good clinically so I would be ok with discharge-  I will arrange follow up labs next week and close follow up with [facility]   Cecille Aver  [facility]

## 2023-06-05 NOTE — Discharge Instructions (Addendum)
Calvin Schultz,   Thank you for entrusting [place] with your care. You were hospitalized due to an increase in your creatinine levels, which is an indicator of kidney function. Imaging revealed a staghorn caliculus in your kidney. Given that your creatinine levels continued to rise, urology performed a percutaneous nephrostomy tube placement in your left kidney. We cultured the draining fluid from the tube to assess for bacterial growth. It grew bacteria and now we are waiting to see what specific type of bacteria this is. You were started on ceftriaxone an antibiotic in your vein as prophylaxis before your procedure yesterday. Urology recommended we switch you to an oral antibiotic called Bactrim today until we know the more specific type of bacteria that may be growing. With regards to:  Acute Kidney Injury and Staghorn Caliculus  You saw three specialists during hospitalization Urologists, Nephrologists, and Interventional Radiologists. For urology:  - You will be discharged with Bactrim for two weeks. Urology will follow up with you should these antibiotics be changed once we determine what bacteria are growing and what antibiotics they will respond to. -Urology is working on scheduling your outpatient procedure to break the kidney stone ( percutaneous nephrolithotomy). Please follow-up with Urology in 2-3 weeks. You should receive a call from the Urology office. They mentioned they will attempt to call you twice. If you do not answer either call, you will need to call their office to schedule an appointment. Their phone number for Urology is [phone].  -Urology should also be able to follow your elevated PSA levels  For Interventional Radiology - Interventional Radiology placed the nephrostomy tube while you were hospitalized. They recommend following up with them in 4-6 weeks. They will be calling you to schedule this appointment.  - At your follow-up Urology appointment in 2-3 weeks, Urology will  assess if they can remove your nephrostomy tube. If they cannot remove it, you will need to see Interventional Radiology group. Urology recommends keeping the appointment with Interventional Radiology in the case they are unable to remove the tube. -IF THEY TUBE IS NOT DRAINING: Please call [phone]  -TO MAKE THE FOLLOW UP APPOINTMENT: Please call [phone]   For Nephrology:  - Your creatinine is slowly improving during your admission, which is reassuring; however, it is still elevated. Nephrology would like to follow up with you regarding more specialized care for your chronic kidney disease. They will be reaching out to you. If you do not hear from their office, please call to establish care with them. The number for [employer] is [phone].   - Nephrology started you on a medicine called Sevelamer because your phosphorus levels were elevated. We think this is due to your decreasing kidney function as phosphorus is excreted through the kidneys. Please continue this medication; you should take it three times per day with meals. Follow-up with nephrology for this.    High Blood Pressure: - Stop taking olmesartan because this medication is not recommended if you have a kidney injury - We started you on amlodipine 5 mg daily. Please continue this medication as prescribed.  - We recommend following up with your PCP on whether you need to take metoprolol XL.   Thank you again for allowing [place] to take care of you.   Sincerely,  Your Internal Medicine Team

## 2023-06-05 NOTE — Evaluation (Signed)
Physical Therapy Evaluation Patient Details Name: Zacariah Belue MRN: [id number] DOB: [birth year] Today's Date: 06/05/2023  History of Present Illness  73 y.o. male presents to [facility] hospital on 05/30/2023 with concerns of increased creatinine, admitted for an AKI on CKD. PMH includes HTN, DMII, HLD, afib.  Clinical Impression  Pt presents to PT at or near his baseline. Pt is able to ambulate with support of SPC for household distances. Pt reports that he has been mobilizing frequently during this admission both within the room and in the hallway without staff assistance, and he has no concerns about his mobility at this time. PT recommends discharge home, no PT or DME needs.        If plan is discharge home, recommend the following:     Can travel by private vehicle        Equipment Recommendations None recommended by PT  Recommendations for Other Services       Functional Status Assessment Patient has not had a recent decline in their functional status     Precautions / Restrictions Precautions Precautions: Other (comment) Precaution Comments: L nephrostomy tube Restrictions Weight Bearing Restrictions Per Provider Order: No      Mobility  Bed Mobility Overal bed mobility: Modified Independent                  Transfers Overall transfer level: Modified independent Equipment used: Straight cane               General transfer comment: from elevated surface, taller bed at home    Ambulation/Gait Ambulation/Gait assistance: Modified independent (Device/Increase time) Gait Distance (Feet): 300 Feet Assistive device: Straight cane Gait Pattern/deviations: Step-through pattern Gait velocity: reduced Gait velocity interpretation: 1.31 - 2.62 ft/sec, indicative of limited community ambulator   General Gait Details: steady step-through gait, no significant balance deviations noted, reduced stance time on RLE due to discomfort from knee OA  Stairs             Wheelchair Mobility     Tilt Bed    Modified Rankin (Stroke Patients Only)       Balance Overall balance assessment: Needs assistance Sitting-balance support: No upper extremity supported, Feet supported Sitting balance-Leahy Scale: Good     Standing balance support: Single extremity supported, Reliant on assistive device for balance Standing balance-Leahy Scale: Poor                               Pertinent Vitals/Pain Pain Assessment Pain Assessment: Faces Faces Pain Scale: Hurts little more Pain Location: low back Pain Descriptors / Indicators: Aching Pain Intervention(s): Monitored during session    Home Living Family/patient expects to be discharged to:: Private residence Living Arrangements: Spouse/significant other Available Help at Discharge: Family;Available PRN/intermittently Type of Home: House Home Access: Ramped entrance       Home Layout: Two level;Able to live on main level with bedroom/bathroom Home Equipment: Rolling Walker (2 wheels);Rollator (4 wheels);Cane - single point;BSC/3in1;Shower seat      Prior Function Prior Level of Function : Independent/Modified Independent;Driving             Mobility Comments: ambulatory with [facility]       Extremity/Trunk Assessment   Upper Extremity Assessment Upper Extremity Assessment: Overall WFL for tasks assessed    Lower Extremity Assessment Lower Extremity Assessment: RLE deficits/detail;LLE deficits/detail RLE Deficits / Details: pt with chronic knee OA, ROM WFL, at least 4/5 based on observed mobility.  LLE Deficits / Details: pt with chronic knee OA, ROM WFL, at least 4/5 based on observed mobility.    Cervical / Trunk Assessment Cervical / Trunk Assessment: Normal  Communication   Communication Communication: No apparent difficulties Cueing Techniques: Verbal cues  Cognition Arousal: Alert Behavior During Therapy: WFL for tasks assessed/performed Overall Cognitive Status:  Within Functional Limits for tasks assessed                                          General Comments General comments (skin integrity, edema, etc.): VSS on RA    Exercises     Assessment/Plan    PT Assessment Patient does not need any further PT services  PT Problem List         PT Treatment Interventions      PT Goals (Current goals can be found in the Care Plan section)       Frequency       Co-evaluation               AM-PAC PT "6 Clicks" Mobility  Outcome Measure Help needed turning from your back to your side while in a flat bed without using bedrails?: None Help needed moving from lying on your back to sitting on the side of a flat bed without using bedrails?: None Help needed moving to and from a bed to a chair (including a wheelchair)?: None Help needed standing up from a chair using your arms (e.g., wheelchair or bedside chair)?: None Help needed to walk in hospital room?: None Help needed climbing 3-5 steps with a railing? : None 6 Click Score: 24    End of Session   Activity Tolerance: Patient tolerated treatment well Patient left: with call bell/phone within reach;in bed Nurse Communication: Mobility status PT Visit Diagnosis: Other abnormalities of gait and mobility (R26.89)    Time: 1610-9604 PT Time Calculation (min) (ACUTE ONLY): 17 min   Charges:   PT Evaluation $PT Eval Low Complexity: 1 Low   PT General Charges $$ ACUTE PT VISIT: 1 Visit         Arlyss Gandy, PT, DPT Acute Rehabilitation Office [phone]   Arlyss Gandy 06/05/2023, 10:33 AM

## 2023-06-05 NOTE — Progress Notes (Signed)
Jaekwon and wife who is present at bedside for education. Pt and wife educated on dressing changes to left nephrosotmy tube and how to drain and document output, how to flush and to only use syringes and not any other water to keep sterile at site. Pt educated on signs and symptoms of infection and reasons to contact MD or call 911. Thor verbalized understanding and was able to demonstrate draining, and wife was able to demonstrate flushes and how to change dressing.

## 2023-06-05 NOTE — Discharge Summary (Cosign Needed)
Name: Calvin Schultz MRN: [id number] DOB: 1950-06-13 73 y.o. PCP: Calvin Found, MD  Date of Admission: 05/30/2023  8:10 AM Date of Discharge:  06/05/2023 Attending Physician: Dr. Cleda Schultz  DISCHARGE DIAGNOSIS:  Primary Problem: Acute kidney injury superimposed on chronic kidney disease [facility])   Hospital Problems: Principal Problem:   Acute kidney injury superimposed on chronic kidney disease (HCC) Active Problems:   Elevated PSA   Nephrolithiasis   Bilateral lower extremity edema   Chronic atrial fibrillation (HCC)   Anemia   Metabolic acidosis   Abnormal urinalysis   Proteinuria    DISCHARGE MEDICATIONS:   Allergies as of 06/05/2023   No Known Allergies      Medication List     STOP taking these medications    metoprolol succinate 25 MG 24 hr tablet Commonly known as: TOPROL-XL       TAKE these medications    amLODipine 5 MG tablet Commonly known as: NORVASC Take 1 tablet (5 mg total) by mouth daily.   Eliquis 5 MG Tabs tablet Generic drug: apixaban Take 5 mg by mouth 2 (two) times daily.   Levemir FlexTouch 100 UNIT/ML FlexTouch Pen Generic drug: insulin detemir Inject 20 Units into the skin at bedtime.   rosuvastatin 40 MG tablet Commonly known as: CRESTOR Take 40 mg by mouth daily.   sevelamer carbonate 800 MG tablet Commonly known as: RENVELA Take 1 tablet (800 mg total) by mouth 3 (three) times daily with meals.   sulfamethoxazole-trimethoprim 800-160 MG tablet Commonly known as: BACTRIM DS Take 1 tablet by mouth 2 (two) times daily for 15 days. Start taking on: June 06, 2023        DISPOSITION AND FOLLOW-UP:  Mr.Calvin Schultz was discharged from [facility] in Stable condition. At the hospital follow up visit please address:  Follow-up Recommendations: Consults: Nephrology, Urology, Interventional Radiology Labs: CBC and Kidney Profile PSA Medications: FU on metoprolol, stop olmesartan, on amlodipine and  sevelamer now.  - Urology to follow up on bacterial cultures and reach out to patient if antibiotic needs to be adjusted - Urology to schedule follow-up appointment in 2-3 weeks and assess if nephrostomy tube can be removed - Urology to schedule outpatient percutaneous nephrolithotomy  - Urology to follow-up on elevated PSA Schultz on pre-hospitalization labs - IR to schedule appointment in 4-6 weeks if nephrostomy tube is unable to be removed by urology - Nephrology to follow-up with CKD management - PCP to follow-up post hospitalization. Decide if patient needs to be on metoprolol.   Follow-up Appointments:  Follow-up Information     Calvin Found, MD Follow up.   Specialty: Family Medicine Contact information: [address] [place] [address] [phone]         [facility] Follow up.   Why: Routine left nephrostomy tube exchange will be needed in 4-6 weeks. A scheduler from our clinic will call you with a date/time of your appointment. Contact information: [address] [place] [address] [phone]         Calvin Rhodes, MD. Schedule an appointment as soon as possible for a visit.   Specialty: Nephrology Why: For hospital follow up and establishing care with nephrology Contact information: [address] [place] [address] [phone]                 HOSPITAL COURSE:  Patient Summary: Calvin Schultz is a 73 y.o. with a PMH significant for HTN, T2DM, HLD,  Afib, CKD who presented to the ED after prompting by PCP because of elevated Cr levels and admitted for an AKI superimposed on CKD.  AKI superimposed on CKD stage V NAGMA Proteinuria Staghorn calculus (L)- non-obstructing Hx of bilateral staghorn calculi s/p surgery mod to severe hydronephrosis on the left  Nephrolithiasis  Patient obtained labs for a routine PCP appointment. Creatine Schultz to be 4.46 on admission. Per chart review, Uncertain if  creatinine had been increasing chronically, there was a gap in the labs from 2023 to 2025 (Cr 2.48 > 4.06). On admission, patient was asymptomatic except for significant bilateral leg swelling. Concern swelling may have been related to nephrotic syndrome related to diabetic kidney disease or heart-related. Echo revealed EF 60-65%. High dose lasix 80 TID started for volume overload. Sevelamer carbonate started with increased phosphorus levels. On admission, patient also had slight leukocytosis (12.0) in addition to large leukocytes, few bacteria and WBC>50 on UA. Patient received 1 dose of ceftriaxone. Urine cultures revealed multiple species present.  Nephrotic labs unremarkable other than increased kappa, lambda and kappa/lambda ratio. K/L ratio <3, less concerning for plasma cell disorder and more consistent with CKD. Protein electrophoresis WNL and M-spike not observed.  Renal U/S revealed moderate L hydronephrosis. CT revealed several calculi in the left renal collecting system with possible staghorn calculus concerning for L UPJ stricture. Given rising creatinine levels, patient underwent a left percutaneous nephrostomy tube placement on 06/03/2023 with outpt percutaneous nephrolithotomy recommended. After procedure, purulent fluid drained from nephrostomy tube was concerning for infection. Culture grew moderate WBC, predominately PMN with rare gram positive cocci in clusters. Continued ceftriaxone for 2 more days. At discharge, speciation pending with few staphylococcus epidermidis growing. Patient remained asymptomatic. Patient transitioned from IV ceftriaxone to oral Bactrim per urology's recommendation.  During admission, lasix 80 mg IV TID decreased to lasix 80 mg IV daily with discontinuation after edema resolution. Patient's b/l LE edema significantly improved during admission.    -Pt to follow up with [facility] for CKD Stage V -Pt to follow up with Urology for antibiotic  sensitivities, discharged on 15 days of Bactrim per Urology.  -Pt to follow up with Urology in 2-3 weeks for percutaneous nephrostomy tube placement  -Pt has an appt with IR in 4-6 weeks   Elevated PSA  Routine pre-hospital labs revealed a PSA of 17.86. As PSA can be elevated with acute injury, recommend outpatient follow-up. Timing of PSA may be more than 4-6 weeks as surgery can cause increased levels of PSA.  -Pt to follow up with Urology   Iron Deficiency Anemia Microscopic Hematuria Hbg on admission 8.0 with iron level 34, and ferritin 23. RBC 11-20 in urine microscopy. Per chart review, (+) cologuard in 2023. Do not see colonoscopy follow-up.  -Pt will need outpatient colonoscopy.   HTN  Patient on home Olmesartan Medoxomil prior to admission. Given AKI, medication held and amlodipine 5 mg started. Patient continued on amlodipine at discharge given CKD.   HLD  Last lipid panel noted on 07/19/21 WNL. Patient was continued on home rosuvastatin.   TII DM  Patient's glucose stable during admission. He reports using insulin PRN as needed at home prior to admission with hx of uncontrolled DM. Hbg A1c on prior hospital labs 6.4.  -ctm outpatient   A Fib  On Eliquis at home. Eliquis continued during hospitalization and held prior to IR procedure. Restarted prior to discharge. -ctm outpatient   DISCHARGE INSTRUCTIONS:   Discharge Instructions  Call MD for:  difficulty breathing, headache or visual disturbances   Complete by: As directed    Call MD for:  extreme fatigue   Complete by: As directed    Call MD for:  hives   Complete by: As directed    Call MD for:  persistant dizziness or light-headedness   Complete by: As directed    Call MD for:  persistant nausea and vomiting   Complete by: As directed    Call MD for:  redness, tenderness, or signs of infection (pain, swelling, redness, odor or green/yellow discharge around incision site)   Complete by: As directed    Call MD  for:  severe uncontrolled pain   Complete by: As directed    Call MD for:  temperature >100.4   Complete by: As directed    Diet - low sodium heart healthy   Complete by: As directed    Discharge instructions   Complete by: As directed    Mr. Cubillos,    Thank you for entrusting [place] with your care. You were hospitalized due to an increase in your creatinine levels, which is an indicator of kidney function. Imaging revealed a staghorn caliculus in your kidney. Given that your creatinine levels continued to rise, urology performed a percutaneous nephrostomy tube placement in your left kidney. We cultured the draining fluid from the tube to assess for bacterial growth. It grew bacteria and now we are waiting to see what specific type of bacteria this is. You were started on ceftriaxone an antibiotic in your vein as prophylaxis before your procedure yesterday. Urology recommended we switch you to an oral antibiotic called Bactrim today until we know the more specific type of bacteria that may be growing. With regards to:   Acute Kidney Injury and Staghorn Caliculus   You saw three specialists during hospitalization Urologists, Nephrologists, and Interventional Radiologists. For urology:   - You will be discharged with Bactrim for two weeks. Urology will follow up with you should these antibiotics be changed once we determine what bacteria are growing and what antibiotics they will respond to. -Urology is working on scheduling your outpatient procedure to break the kidney stone ( percutaneous nephrolithotomy). Please follow-up with Urology in 2-3 weeks. You should receive a call from the Urology office. They mentioned they will attempt to call you twice. If you do not answer either call, you will need to call their office to schedule an appointment. Their phone number for Urology is [phone].  -Urology should also be able to follow your elevated PSA levels   For Interventional Radiology -  Interventional Radiology placed the nephrostomy tube while you were hospitalized. They recommend following up with them in 4-6 weeks. They will be calling you to schedule this appointment.  - At your follow-up Urology appointment in 2-3 weeks, Urology will assess if they can remove your nephrostomy tube. If they cannot remove it, you will need to see Interventional Radiology group. Urology recommends keeping the appointment with Interventional Radiology in the case they are unable to remove the tube. -IF THEY TUBE IS NOT DRAINING: Please call [phone]  -TO MAKE THE FOLLOW UP APPOINTMENT: Please call [phone]    For Nephrology:  - Your creatinine is slowly improving during your admission, which is reassuring; however, it is still elevated. Nephrology would like to follow up with you regarding more specialized care for your chronic kidney disease. They will be reaching out to you. If you do not hear from their  office, please call to establish care with them. The number for [employer] is [phone].    - Nephrology started you on a medicine called Sevelamer because your phosphorus levels were elevated. We think this is due to your decreasing kidney function as phosphorus is excreted through the kidneys. Please continue this medication; you should take it three times per day with meals. Follow-up with nephrology for this.     High Blood Pressure: - Stop taking olmesartan because this medication is not recommended if you have a kidney injury - We started you on amlodipine 5 mg daily. Please continue this medication as prescribed.  - We recommend following up with your PCP on whether you need to take metoprolol XL.    Thank you again for allowing [place] to take care of you.    Sincerely,  Your Internal Medicine Team   Increase activity slowly   Complete by: As directed    No wound care   Complete by: As directed        SUBJECTIVE:   Feeling well, has back pain from  sleeping on the bed. Took tylenol and that helped. Legs are not so edematous anymore. Wondering about antibiotic use.   Discharge Vitals:   BP (!) 148/55 (BP Location: Left Arm)   Pulse (!) 103   Temp 97.8 F (36.6 C) (Oral)   Resp 18   Ht 5\' 9"  (1.753 m)   Wt 101.6 kg   SpO2 100%   BMI 33.08 kg/m   OBJECTIVE:  Physical Exam Constitutional:      General: He is not in acute distress.    Appearance: He is not ill-appearing or toxic-appearing.  HENT:     Head: Normocephalic.  Cardiovascular:     Rate and Rhythm: Normal rate. Rhythm irregular.  Pulmonary:     Effort: Pulmonary effort is normal. No respiratory distress.     Breath sounds: Rales (on left base) present. No wheezing or rhonchi.  Abdominal:     General: Bowel sounds are normal. There is no distension.     Palpations: Abdomen is soft.     Tenderness: There is no abdominal tenderness. There is no right CVA tenderness, left CVA tenderness, guarding or rebound.  Musculoskeletal:     Right lower leg: 1+ Edema present.     Left lower leg: 1+ Edema present.  Skin:    General: Skin is warm and dry.     Capillary Refill: Capillary refill takes less than 2 seconds.  Neurological:     Mental Status: He is alert and oriented to person, place, and time.     Pertinent Labs, Studies, and Procedures:     Latest Ref Rng & Units 06/05/2023    6:08 AM 06/04/2023    5:03 AM 06/03/2023    6:47 AM  CBC  WBC 4.0 - 10.5 K/uL 12.0  12.3  11.8   Hemoglobin 13.0 - 17.0 g/dL 57.8  9.1  46.9   Hematocrit 39.0 - 52.0 % 32.4  29.3  32.4   Platelets 150 - 400 K/uL 309  261  304        Latest Ref Rng & Units 06/05/2023    6:08 AM 06/04/2023    5:03 AM 06/03/2023    6:47 AM  CMP  Glucose 70 - 99 mg/dL 629  528  413   BUN 8 - 23 mg/dL 48  53  54   Creatinine 0.61 - 1.24 mg/dL 2.44  0.10  2.72  Sodium 135 - 145 mmol/L 135  137  135   Potassium 3.5 - 5.1 mmol/L 3.7  3.5  3.6   Chloride 98 - 111 mmol/L 105  106  100   CO2 22 - 32 mmol/L  20  22  20    Calcium 8.9 - 10.3 mg/dL 8.9  8.7  9.2     ECHOCARDIOGRAM COMPLETE Result Date: 05/31/2023    ECHOCARDIOGRAM REPORT   Patient Name:   ELVEN LABOY Date of Exam: 05/31/2023 Medical Rec #:  [id number]   Height:       69.0 in Accession #:    [id number]  Weight:       238.1 lb Date of Birth:  07/30/1950  BSA:          2.225 m Patient Age:    72 years    BP:           162/97 mmHg Patient Gender: M           HR:           76 bpm. Exam Location:  Inpatient Procedure: 2D Echo, Color Doppler and Cardiac Doppler Indications:    Lower extremity edema  History:        Patient has no prior history of Echocardiogram examinations.                 Chronic Kidney Disease.  Sonographer:    Rosaland Lao Sonographer#2:  Delcie Roch RDCS Referring Phys: [id number] JULIE ANNE WILLIAMS IMPRESSIONS  1. Left ventricular ejection fraction, by estimation, is 60 to 65%. The left ventricle has normal function. The left ventricle has no regional wall motion abnormalities. The left ventricular internal cavity size was moderately dilated. There is moderate  left ventricular hypertrophy. Left ventricular diastolic parameters were normal.  2. Right ventricular systolic function is normal. The right ventricular size is mildly enlarged.  3. Left atrial size was mildly dilated.  4. Right atrial size was mildly dilated.  5. The mitral valve is normal in structure. Mild mitral valve regurgitation. No evidence of mitral stenosis.  6. The aortic valve is tricuspid. Aortic valve regurgitation is not visualized. Aortic valve sclerosis is present, with no evidence of aortic valve stenosis.  7. Aortic dilatation noted. There is borderline dilatation of the aortic root, measuring 38 mm.  8. The inferior vena cava is normal in size with greater than 50% respiratory variability, suggesting right atrial pressure of 3 mmHg. Comparison(s): No prior Echocardiogram. FINDINGS  Left Ventricle: Left ventricular ejection fraction, by estimation, is 60 to  65%. The left ventricle has normal function. The left ventricle has no regional wall motion abnormalities. The left ventricular internal cavity size was moderately dilated. There is moderate left ventricular hypertrophy. Left ventricular diastolic parameters were normal. Right Ventricle: The right ventricular size is mildly enlarged. Right ventricular systolic function is normal. Left Atrium: Left atrial size was mildly dilated. Right Atrium: Right atrial size was mildly dilated. Pericardium: There is no evidence of pericardial effusion. Mitral Valve: The mitral valve is normal in structure. Mild mitral annular calcification. Mild mitral valve regurgitation. No evidence of mitral valve stenosis. Tricuspid Valve: The tricuspid valve is normal in structure. Tricuspid valve regurgitation is trivial. No evidence of tricuspid stenosis. Aortic Valve: The aortic valve is tricuspid. Aortic valve regurgitation is not visualized. Aortic valve sclerosis is present, with no evidence of aortic valve stenosis. Pulmonic Valve: The pulmonic valve was normal in structure. Pulmonic valve regurgitation is not visualized. No evidence of pulmonic  stenosis. Aorta: Aortic dilatation noted. There is borderline dilatation of the aortic root, measuring 38 mm. Venous: The inferior vena cava is normal in size with greater than 50% respiratory variability, suggesting right atrial pressure of 3 mmHg. IAS/Shunts: No atrial level shunt detected by color flow Doppler.  LEFT VENTRICLE PLAX 2D LVIDd:         6.10 cm   Diastology LVIDs:         4.30 cm   LV e' medial:    9.81 cm/s LV PW:         1.40 cm   LV E/e' medial:  10.5 LV IVS:        1.20 cm   LV e' lateral:   9.24 cm/s LVOT diam:     2.20 cm   LV E/e' lateral: 11.1 LV SV:         81 LV SV Index:   36 LVOT Area:     3.80 cm  RIGHT VENTRICLE             IVC RV Basal diam:  4.30 cm     IVC diam: 2.10 cm RV Mid diam:    3.80 cm RV S prime:     16.00 cm/s TAPSE (M-mode): 3.2 cm LEFT ATRIUM              Index        RIGHT ATRIUM           Index LA diam:        5.20 cm 2.34 cm/m   RA Area:     24.50 cm LA Vol (A2C):   50.8 ml 22.83 ml/m  RA Volume:   85.60 ml  38.47 ml/m LA Vol (A4C):   72.1 ml 32.41 ml/m LA Biplane Vol: 60.8 ml 27.33 ml/m  AORTIC VALVE LVOT Vmax:   102.00 cm/s LVOT Vmean:  64.800 cm/s LVOT VTI:    0.213 m  AORTA Ao Root diam: 3.80 cm Ao Asc diam:  3.60 cm MITRAL VALVE MV Area (PHT): 5.97 cm     SHUNTS MV Decel Time: 127 msec     Systemic VTI:  0.21 m MV E velocity: 103.00 cm/s  Systemic Diam: 2.20 cm MV A velocity: 107.00 cm/s MV E/A ratio:  0.96 [name] MD Electronically signed by [name] MD Signature Date/Time: 05/31/2023/12:19:56 PM    Final    DG Chest 2 View Result Date: 05/30/2023 CLINICAL DATA:  Acute renal injury EXAM: CHEST - 2 VIEW COMPARISON:  None Available. FINDINGS: Normal lung volumes. No focal consolidations. Blunting of the left costophrenic angle with thickening of the left lateral pleura. No pneumothorax. Mildly enlarged cardiomediastinal silhouette. No acute osseous abnormality. IMPRESSION: 1. Mild cardiomegaly. 2. Blunting of the left costophrenic angle, which may represent pleural thickening. Electronically Signed   By: Agustin Cree M.D.   On: 05/30/2023 16:57   CT ABDOMEN PELVIS WO CONTRAST Result Date: 05/30/2023 CLINICAL DATA:  unilateral significant hydro on [place], creat 4.5 new diagnosis renal failure, eval for cause of hydro. EXAM: CT ABDOMEN AND PELVIS WITHOUT CONTRAST TECHNIQUE: Multidetector CT imaging of the abdomen and pelvis was performed following the standard protocol without IV contrast. RADIATION DOSE REDUCTION: This exam was performed according to the departmental dose-optimization program which includes automated exposure control, adjustment of the mA and/or kV according to patient size and/or use of iterative reconstruction technique. COMPARISON:  CT scan urogram report from 09/12/2014. Please note images are not available for review  at the  time of dictation. FINDINGS: Lower chest: There are atelectatic changes at the left lung base. The lung bases are otherwise clear. No pleural effusion. The heart is normal in size. No pericardial effusion. Hepatobiliary: The liver is normal in size. There is subtle irregularity/nodularity of the liver surface, raising the concern for liver parenchymal disease. Correlate clinically. No suspicious mass. No intrahepatic or extrahepatic bile duct dilation. There are small volume calcified gallstones, without imaging signs of acute cholecystitis. Normal gallbladder wall thickness. No pericholecystic inflammatory changes. Pancreas: Unremarkable. No pancreatic ductal dilatation or surrounding inflammatory changes. Spleen: Within normal limits. No focal lesion. Adrenals/Urinary Tract: Adrenal glands are unremarkable. There is a 5 mm nonobstructing calculus in the right kidney lower pole. There is mild right hydroureter however, no discrete obstructing mass ureteric calculus seen. No right hydronephrosis. Mild right perinephric fat stranding, nonspecific. There are several calculi in the left kidney with largest branching calculus in the interpolar region measuring up to 1.3 x 1.7 cm. There is moderate-to-severe left hydronephrosis with moderate thinning of the left renal cortex. However, left ureter is nondilated. The above-mentioned calculus in the interpolar region may represent portion of staghorn calculus with probable associated renal UPJ stricture. However, examination is limited due to lack of intravenous contrast. There is mild left perinephric fat stranding as well. Urinary bladder is partially distended precluding optimal assessment however, no urinary bladder mass or calculi seen. No significant bladder wall thickening or perivesical fat stranding. Stomach/Bowel: No disproportionate dilation of the small or large bowel loops. No evidence of abnormal bowel wall thickening or inflammatory changes. The  appendix is unremarkable. There are scattered diverticula throughout the colon, without imaging signs of diverticulitis. Vascular/Lymphatic: No ascites or pneumoperitoneum. No abdominal or pelvic lymphadenopathy, by size criteria. No aneurysmal dilation of the major abdominal arteries. There are mild peripheral atherosclerotic vascular calcifications of the aorta and its major branches. Reproductive: Enlarged prostate. Symmetric seminal vesicles. Other: There is a tiny fat containing umbilical hernia. There is mild anasarca. Musculoskeletal: No suspicious osseous lesions. There are mild - moderate multilevel degenerative changes in the visualized spine. IMPRESSION: 1. There are several calculi in the left renal collecting system with largest branching calculus in the interpolar region measuring up to 1.3 x 1.7 cm, favored to represent portion of staghorn calculus. There is moderate to severe left hydronephrosis with moderate left renal cortical thinning; however, left ureter is nondilated. No left ureterolithiasis. 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 Consider contrast enhanced/delayed images for further evaluation. 2. There are additional left renal calculi and a 5 mm nonobstructing calculus in the right kidney lower pole. No right hydroureteronephrosis or ureterolithiasis. 3. Multiple other nonacute observations (such as subtle liver surface irregularity/nodularity, cholelithiasis without acute cholecystitis, scattered colonic diverticulosis, etc.), As described above. Electronically Signed   By: Jules Schick M.D.   On: 05/30/2023 16:23   US RENAL Result Date: 05/30/2023 CLINICAL DATA:  Acute kidney injury. EXAM: RENAL / URINARY TRACT ULTRASOUND COMPLETE COMPARISON:  Report of renal ultrasound 06/08/2015. Images are not currently available. FINDINGS: Right Kidney: Renal measurements: 11.1 x 5.1 x 7.6 cm = volume: 228 mL. Echogenicity within  normal limits. No mass or hydronephrosis visualized. Left Kidney: Renal measurements: 12.8 x 7.6 x 7.6 cm = volume: 247 mL. Moderate hydronephrosis is present. Renal parenchyma is within normal limits. Bladder: Appears normal for degree of bladder distention. Other: None. IMPRESSION: 1. Moderate left hydronephrosis. 2. Normal sonographic appearance of the right kidney. Electronically  Signed   By: Marin Roberts M.D.   On: 05/30/2023 12:51     Signed: Manuela Neptune, MD Internal Medicine Resident, PGY-1 [facility]  Pager: [phone] 3:49 PM, 06/05/2023

## 2023-06-05 NOTE — Progress Notes (Signed)
TOC medications picked up by wife and brought to room.

## 2023-06-05 NOTE — Progress Notes (Signed)
Supplies given to [facility] for nephrostomy tube.

## 2023-06-05 NOTE — Progress Notes (Signed)
     Subjective: NAEON.  Reviewed the natural history of this kidney stone development and available interventions again, now that his wife was able to join [place].  Objective: Vital signs in last 24 hours: Temp:  [97.8 F (36.6 C)-98.5 F (36.9 C)] 97.8 F (36.6 C) (01/31 0905) Pulse Rate:  [71-107] 103 (01/31 0905) Resp:  [17-18] 18 (01/31 0905) BP: (105-148)/(55-63) 148/55 (01/31 0905) SpO2:  [98 %-100 %] 100 % (01/31 0905)  Assessment/Plan: #AoCKD # Staghorn stone # Hydronephrosis  CT A/P collected 05/30/2023 shows 13 x 17 mm left UPJ stone with associated hydronephrosis and moderate cortical thinning.  S/p left percutaneous nephrostomy tube placed with IR on 1/29.  Outpatient follow-up scheduled for tube exchange  Lasix down to daily administration from TID.   Nephrology following.  Close outpatient follow-up arranged.  Continued interval improvement in serum creatinine.    Okay to discharge from urologic perspective.  He will follow-up in clinic within a couple of weeks to discuss continuation of antibiotics if necessary and schedule ureteroscopy versus PCNL.  Intake/Output from previous day: 01/30 0701 - 01/31 0700 In: 10  Out: 800 [Urine:800]  Intake/Output this shift: Total I/O In: 360 [P.O.:360] Out: 200 [Urine:200]  Physical Exam:  General: Alert and oriented CV: No cyanosis Lungs: equal chest rise GU: Left PCNT in place   Lab Results: Recent Labs    06/03/23 0647 06/04/23 0503 06/05/23 0608  HGB 10.2* 9.1* 10.2*  HCT 32.4* 29.3* 32.4*   BMET Recent Labs    06/04/23 0503 06/05/23 0608  NA 137 135  K 3.5 3.7  CL 106 105  CO2 22 20*  GLUCOSE 121* 110*  BUN 53* 48*  CREATININE 5.24* 4.94*  CALCIUM 8.7* 8.9     Studies/Results: No results found.     LOS: 5 days   Elmon Kirschner, NP [facility] Pager: [phone]  06/05/2023, 1:54 PM

## 2023-06-08 ENCOUNTER — Other Ambulatory Visit: Payer: Self-pay | Admitting: Interventional Radiology

## 2023-06-08 ENCOUNTER — Other Ambulatory Visit (HOSPITAL_COMMUNITY): Payer: Self-pay

## 2023-06-08 DIAGNOSIS — N200 Calculus of kidney: Secondary | ICD-10-CM

## 2023-06-08 LAB — AEROBIC/ANAEROBIC CULTURE W GRAM STAIN (SURGICAL/DEEP WOUND)

## 2023-06-09 ENCOUNTER — Encounter: Payer: Self-pay | Admitting: Interventional Radiology

## 2023-06-22 ENCOUNTER — Other Ambulatory Visit: Payer: Self-pay | Admitting: Urology

## 2023-06-22 ENCOUNTER — Telehealth: Payer: Self-pay | Admitting: Cardiology

## 2023-06-22 NOTE — Telephone Encounter (Signed)
   Name: Calvin Schultz  DOB: 1951/05/03  MRN: [id number]  Primary Cardiologist: None  Chart reviewed as part of pre-operative protocol coverage. Because of [name] past medical history and time since last visit, he will require a follow-up in-office visit in order to better assess preoperative cardiovascular risk.  Pre-op covering staff: - Please schedule appointment and call patient to inform them. If patient already had an upcoming appointment within acceptable timeframe, please add "pre-op clearance" to the appointment notes so provider is aware. - Please contact requesting surgeon's office via preferred method (i.e, phone, fax) to inform them of need for appointment prior to surgery.  This message will also be routed to pharmacy pool for input on holding Eliquis as requested below so that this information is available to the clearing provider at time of patient's appointment.   [name], NP  06/22/2023, 4:49 PM

## 2023-06-22 NOTE — Telephone Encounter (Signed)
   Pre-operative Risk Assessment    Patient Name: Calvin Schultz  DOB: 1950/05/25 MRN: [id number]   Date of last office visit: 08/30/21 Date of next office visit:   Not yet scheduled   Request for Surgical Clearance    Procedure:   Urethroscopy, holmium laser of stone  Date of Surgery:  Clearance 07/24/23                                Surgeon:  Dr. Jerilee Field Surgeon's Group or Practice Name:  [facility]  Phone number:  [phone]  Fax number:  [phone]   Type of Clearance Requested:   - Medical  - Pharmacy:  Hold Apixaban (Eliquis)     Type of Anesthesia:  General    Additional requests/questions:   Caller Bjorn Loser) noted patient will need to hold his Eliquis for 2 days   Signed, Annetta Maw   06/22/2023, 3:21 PM

## 2023-06-22 NOTE — Telephone Encounter (Signed)
 Patient has been scheduled for in person office visit for preop clearance

## 2023-06-22 NOTE — Telephone Encounter (Signed)
 Please advise holding Eliquis prior to urethroscopy, holmium laser of stone.  Thank you!  DW

## 2023-06-24 NOTE — Telephone Encounter (Signed)
 Patient with diagnosis of atrial fibrillation on Eliquis for anticoagulation.    Procedure:   Urethroscopy, holmium laser of stone   Date of Surgery:  Clearance 07/24/23     CHA2DS2-VASc Score = 3   This indicates a 3.2% annual risk of stroke. The patient's score is based upon: CHF History: 0 HTN History: 1 Diabetes History: 1 Stroke History: 0 Vascular Disease History: 0 Age Score: 1 Gender Score: 0   CrCl 20 Platelet count 293  Per office protocol, patient can hold Eliquis for 2-3 days prior to procedure.   Patient will not need bridging with Lovenox (enoxaparin) around procedure.  **This guidance is not considered finalized until pre-operative APP has relayed final recommendations.**

## 2023-06-26 ENCOUNTER — Emergency Department (HOSPITAL_COMMUNITY): Payer: No Typology Code available for payment source

## 2023-06-26 ENCOUNTER — Encounter (HOSPITAL_COMMUNITY): Payer: Self-pay | Admitting: Pulmonary Disease

## 2023-06-26 ENCOUNTER — Other Ambulatory Visit: Payer: Self-pay

## 2023-06-26 ENCOUNTER — Inpatient Hospital Stay (HOSPITAL_COMMUNITY)
Admission: EM | Admit: 2023-06-26 | Discharge: 2023-07-10 | DRG: 371 | Disposition: A | Discharge status: Rehabilitation Facility | Payer: No Typology Code available for payment source | Attending: Internal Medicine | Admitting: Internal Medicine

## 2023-06-26 DIAGNOSIS — A0472 Enterocolitis due to Clostridium difficile, not specified as recurrent: Principal | ICD-10-CM | POA: Diagnosis present

## 2023-06-26 DIAGNOSIS — Z7901 Long term (current) use of anticoagulants: Secondary | ICD-10-CM | POA: Diagnosis not present

## 2023-06-26 DIAGNOSIS — R579 Shock, unspecified: Secondary | ICD-10-CM

## 2023-06-26 DIAGNOSIS — E669 Obesity, unspecified: Secondary | ICD-10-CM | POA: Diagnosis present

## 2023-06-26 DIAGNOSIS — N184 Chronic kidney disease, stage 4 (severe): Secondary | ICD-10-CM | POA: Diagnosis present

## 2023-06-26 DIAGNOSIS — R600 Localized edema: Secondary | ICD-10-CM | POA: Diagnosis not present

## 2023-06-26 DIAGNOSIS — E872 Acidosis, unspecified: Secondary | ICD-10-CM | POA: Diagnosis present

## 2023-06-26 DIAGNOSIS — A414 Sepsis due to anaerobes: Secondary | ICD-10-CM | POA: Diagnosis present

## 2023-06-26 DIAGNOSIS — I495 Sick sinus syndrome: Secondary | ICD-10-CM | POA: Diagnosis present

## 2023-06-26 DIAGNOSIS — I4891 Unspecified atrial fibrillation: Secondary | ICD-10-CM | POA: Diagnosis not present

## 2023-06-26 DIAGNOSIS — I4892 Unspecified atrial flutter: Secondary | ICD-10-CM | POA: Diagnosis present

## 2023-06-26 DIAGNOSIS — Z8619 Personal history of other infectious and parasitic diseases: Secondary | ICD-10-CM | POA: Diagnosis not present

## 2023-06-26 DIAGNOSIS — M1711 Unilateral primary osteoarthritis, right knee: Secondary | ICD-10-CM | POA: Diagnosis not present

## 2023-06-26 DIAGNOSIS — E860 Dehydration: Secondary | ICD-10-CM | POA: Diagnosis present

## 2023-06-26 DIAGNOSIS — N183 Chronic kidney disease, stage 3 unspecified: Secondary | ICD-10-CM

## 2023-06-26 DIAGNOSIS — E785 Hyperlipidemia, unspecified: Secondary | ICD-10-CM | POA: Diagnosis present

## 2023-06-26 DIAGNOSIS — Z6833 Body mass index (BMI) 33.0-33.9, adult: Secondary | ICD-10-CM

## 2023-06-26 DIAGNOSIS — E861 Hypovolemia: Secondary | ICD-10-CM | POA: Diagnosis present

## 2023-06-26 DIAGNOSIS — N179 Acute kidney failure, unspecified: Secondary | ICD-10-CM | POA: Diagnosis present

## 2023-06-26 DIAGNOSIS — M24512 Contracture, left shoulder: Secondary | ICD-10-CM | POA: Diagnosis not present

## 2023-06-26 DIAGNOSIS — D72823 Leukemoid reaction: Secondary | ICD-10-CM | POA: Diagnosis not present

## 2023-06-26 DIAGNOSIS — R6521 Severe sepsis with septic shock: Secondary | ICD-10-CM | POA: Diagnosis present

## 2023-06-26 DIAGNOSIS — E876 Hypokalemia: Secondary | ICD-10-CM | POA: Diagnosis present

## 2023-06-26 DIAGNOSIS — R262 Difficulty in walking, not elsewhere classified: Secondary | ICD-10-CM | POA: Diagnosis present

## 2023-06-26 DIAGNOSIS — I480 Paroxysmal atrial fibrillation: Secondary | ICD-10-CM | POA: Diagnosis present

## 2023-06-26 DIAGNOSIS — E871 Hypo-osmolality and hyponatremia: Secondary | ICD-10-CM | POA: Diagnosis present

## 2023-06-26 DIAGNOSIS — N3001 Acute cystitis with hematuria: Secondary | ICD-10-CM | POA: Diagnosis not present

## 2023-06-26 DIAGNOSIS — N200 Calculus of kidney: Secondary | ICD-10-CM | POA: Diagnosis not present

## 2023-06-26 DIAGNOSIS — R571 Hypovolemic shock: Secondary | ICD-10-CM | POA: Diagnosis present

## 2023-06-26 DIAGNOSIS — I4811 Longstanding persistent atrial fibrillation: Secondary | ICD-10-CM | POA: Diagnosis not present

## 2023-06-26 DIAGNOSIS — Z87442 Personal history of urinary calculi: Secondary | ICD-10-CM

## 2023-06-26 DIAGNOSIS — I129 Hypertensive chronic kidney disease with stage 1 through stage 4 chronic kidney disease, or unspecified chronic kidney disease: Secondary | ICD-10-CM | POA: Diagnosis present

## 2023-06-26 DIAGNOSIS — N170 Acute kidney failure with tubular necrosis: Secondary | ICD-10-CM | POA: Diagnosis not present

## 2023-06-26 DIAGNOSIS — E119 Type 2 diabetes mellitus without complications: Secondary | ICD-10-CM

## 2023-06-26 DIAGNOSIS — M170 Bilateral primary osteoarthritis of knee: Secondary | ICD-10-CM | POA: Diagnosis not present

## 2023-06-26 DIAGNOSIS — F39 Unspecified mood [affective] disorder: Secondary | ICD-10-CM | POA: Diagnosis not present

## 2023-06-26 DIAGNOSIS — N189 Chronic kidney disease, unspecified: Secondary | ICD-10-CM | POA: Diagnosis not present

## 2023-06-26 DIAGNOSIS — R339 Retention of urine, unspecified: Secondary | ICD-10-CM | POA: Diagnosis not present

## 2023-06-26 DIAGNOSIS — E118 Type 2 diabetes mellitus with unspecified complications: Secondary | ICD-10-CM | POA: Diagnosis not present

## 2023-06-26 DIAGNOSIS — D649 Anemia, unspecified: Secondary | ICD-10-CM | POA: Diagnosis not present

## 2023-06-26 DIAGNOSIS — I4510 Unspecified right bundle-branch block: Secondary | ICD-10-CM | POA: Diagnosis present

## 2023-06-26 DIAGNOSIS — Z794 Long term (current) use of insulin: Secondary | ICD-10-CM

## 2023-06-26 DIAGNOSIS — R319 Hematuria, unspecified: Secondary | ICD-10-CM | POA: Diagnosis not present

## 2023-06-26 DIAGNOSIS — I482 Chronic atrial fibrillation, unspecified: Secondary | ICD-10-CM | POA: Diagnosis present

## 2023-06-26 DIAGNOSIS — I10 Essential (primary) hypertension: Secondary | ICD-10-CM | POA: Diagnosis not present

## 2023-06-26 DIAGNOSIS — I483 Typical atrial flutter: Secondary | ICD-10-CM | POA: Diagnosis not present

## 2023-06-26 DIAGNOSIS — R310 Gross hematuria: Secondary | ICD-10-CM | POA: Diagnosis not present

## 2023-06-26 DIAGNOSIS — M1712 Unilateral primary osteoarthritis, left knee: Secondary | ICD-10-CM | POA: Diagnosis not present

## 2023-06-26 DIAGNOSIS — D72829 Elevated white blood cell count, unspecified: Secondary | ICD-10-CM | POA: Diagnosis not present

## 2023-06-26 DIAGNOSIS — I951 Orthostatic hypotension: Secondary | ICD-10-CM | POA: Diagnosis not present

## 2023-06-26 DIAGNOSIS — F418 Other specified anxiety disorders: Secondary | ICD-10-CM | POA: Diagnosis not present

## 2023-06-26 DIAGNOSIS — Z818 Family history of other mental and behavioral disorders: Secondary | ICD-10-CM

## 2023-06-26 DIAGNOSIS — Z79899 Other long term (current) drug therapy: Secondary | ICD-10-CM

## 2023-06-26 DIAGNOSIS — R5381 Other malaise: Secondary | ICD-10-CM | POA: Diagnosis not present

## 2023-06-26 DIAGNOSIS — N201 Calculus of ureter: Secondary | ICD-10-CM | POA: Diagnosis not present

## 2023-06-26 DIAGNOSIS — E1122 Type 2 diabetes mellitus with diabetic chronic kidney disease: Secondary | ICD-10-CM | POA: Diagnosis present

## 2023-06-26 DIAGNOSIS — Z936 Other artificial openings of urinary tract status: Secondary | ICD-10-CM

## 2023-06-26 DIAGNOSIS — D62 Acute posthemorrhagic anemia: Secondary | ICD-10-CM | POA: Diagnosis present

## 2023-06-26 DIAGNOSIS — G4700 Insomnia, unspecified: Secondary | ICD-10-CM | POA: Diagnosis not present

## 2023-06-26 DIAGNOSIS — A419 Sepsis, unspecified organism: Secondary | ICD-10-CM | POA: Diagnosis not present

## 2023-06-26 DIAGNOSIS — L8990 Pressure ulcer of unspecified site, unspecified stage: Secondary | ICD-10-CM | POA: Diagnosis not present

## 2023-06-26 LAB — CBC WITH DIFFERENTIAL/PLATELET
Abs Immature Granulocytes: 0.23 10*3/uL — ABNORMAL HIGH (ref 0.00–0.07)
Basophils Absolute: 0.1 10*3/uL (ref 0.0–0.1)
Basophils Relative: 0 %
Eosinophils Absolute: 0 10*3/uL (ref 0.0–0.5)
Eosinophils Relative: 0 %
HCT: 29.9 % — ABNORMAL LOW (ref 39.0–52.0)
Hemoglobin: 8.9 g/dL — ABNORMAL LOW (ref 13.0–17.0)
Immature Granulocytes: 1 %
Lymphocytes Relative: 2 %
Lymphs Abs: 0.7 10*3/uL (ref 0.7–4.0)
MCH: 26.6 pg (ref 26.0–34.0)
MCHC: 29.8 g/dL — ABNORMAL LOW (ref 30.0–36.0)
MCV: 89.3 fL (ref 80.0–100.0)
Monocytes Absolute: 2.4 10*3/uL — ABNORMAL HIGH (ref 0.1–1.0)
Monocytes Relative: 8 %
Neutro Abs: 26.1 10*3/uL — ABNORMAL HIGH (ref 1.7–7.7)
Neutrophils Relative %: 89 %
Platelets: 344 10*3/uL (ref 150–400)
RBC: 3.35 MIL/uL — ABNORMAL LOW (ref 4.22–5.81)
RDW: 14.6 % (ref 11.5–15.5)
Smear Review: NORMAL
WBC: 29.5 10*3/uL — ABNORMAL HIGH (ref 4.0–10.5)
nRBC: 0 % (ref 0.0–0.2)

## 2023-06-26 LAB — I-STAT CG4 LACTIC ACID, ED: Lactic Acid, Venous: 1.2 mmol/L (ref 0.5–1.9)

## 2023-06-26 LAB — BASIC METABOLIC PANEL
Anion gap: 14 (ref 5–15)
Anion gap: 14 (ref 5–15)
BUN: 96 mg/dL — ABNORMAL HIGH (ref 8–23)
BUN: 89 mg/dL — ABNORMAL HIGH (ref 8–23)
CO2: 10 mmol/L — ABNORMAL LOW (ref 22–32)
CO2: 11 mmol/L — ABNORMAL LOW (ref 22–32)
Calcium: 7.9 mg/dL — ABNORMAL LOW (ref 8.9–10.3)
Calcium: 7.8 mg/dL — ABNORMAL LOW (ref 8.9–10.3)
Chloride: 105 mmol/L (ref 98–111)
Chloride: 105 mmol/L (ref 98–111)
Creatinine, Ser: 10.8 mg/dL — ABNORMAL HIGH (ref 0.61–1.24)
Creatinine, Ser: 10.49 mg/dL — ABNORMAL HIGH (ref 0.61–1.24)
GFR, Estimated: 5 mL/min — ABNORMAL LOW (ref >60)
GFR, Estimated: 5 mL/min — ABNORMAL LOW (ref >60)
Glucose, Bld: 95 mg/dL (ref 70–99)
Glucose, Bld: 79 mg/dL (ref 70–99)
Potassium: 4.3 mmol/L (ref 3.5–5.1)
Potassium: 4.2 mmol/L (ref 3.5–5.1)
Sodium: 129 mmol/L — ABNORMAL LOW (ref 135–145)
Sodium: 130 mmol/L — ABNORMAL LOW (ref 135–145)

## 2023-06-26 LAB — C DIFFICILE QUICK SCREEN W PCR REFLEX
C Diff antigen: POSITIVE — AB
C Diff interpretation: DETECTED
C Diff toxin: POSITIVE — AB

## 2023-06-26 LAB — GLUCOSE, CAPILLARY
Glucose-Capillary: 75 mg/dL (ref 70–99)
Glucose-Capillary: 93 mg/dL (ref 70–99)

## 2023-06-26 LAB — MRSA NEXT GEN BY PCR, NASAL: MRSA by PCR Next Gen: NOT DETECTED

## 2023-06-26 LAB — CBG MONITORING, ED: Glucose-Capillary: 81 mg/dL (ref 70–99)

## 2023-06-26 MED ORDER — POLYETHYLENE GLYCOL 3350 17 G PO PACK: 17.0000 g | PACK | Freq: Every day | ORAL | Status: DC | PRN

## 2023-06-26 MED ORDER — METRONIDAZOLE 500 MG/100ML IV SOLN
500.0000 mg | Freq: Three times a day (TID) | INTRAVENOUS | Status: DC
  Administered 2023-06-26: 500 mg via INTRAVENOUS
  Filled 2023-06-26: qty 100

## 2023-06-26 MED ORDER — LACTATED RINGERS IV BOLUS
1000.0000 mL | Freq: Once | INTRAVENOUS | Status: AC
  Administered 2023-06-26: 1000 mL via INTRAVENOUS

## 2023-06-26 MED ORDER — LACTATED RINGERS IV SOLN: INTRAVENOUS | Status: DC

## 2023-06-26 MED ORDER — METRONIDAZOLE 500 MG/100ML IV SOLN
500.0000 mg | Freq: Three times a day (TID) | INTRAVENOUS | Status: DC
  Administered 2023-06-27 – 2023-07-06 (×27): 500 mg via INTRAVENOUS
  Filled 2023-06-26 (×27): qty 100

## 2023-06-26 MED ORDER — LACTATED RINGERS IV BOLUS
500.0000 mL | Freq: Once | INTRAVENOUS | Status: AC
  Administered 2023-06-26: 500 mL via INTRAVENOUS

## 2023-06-26 MED ORDER — NOREPINEPHRINE 4 MG/250ML-% IV SOLN
0.0000 ug/min | INTRAVENOUS | Status: DC
  Administered 2023-06-26: 2 ug/min via INTRAVENOUS
  Administered 2023-06-27: 10 ug/min via INTRAVENOUS
  Filled 2023-06-26 (×2): qty 250

## 2023-06-26 MED ORDER — INSULIN ASPART 100 UNIT/ML IJ SOLN
0.0000 [IU] | INTRAMUSCULAR | Status: DC
  Filled 2023-06-26: qty 0.15

## 2023-06-26 MED ORDER — ONDANSETRON HCL 4 MG/2ML IJ SOLN
4.0000 mg | Freq: Once | INTRAMUSCULAR | Status: AC
  Administered 2023-06-26: 4 mg via INTRAVENOUS
  Filled 2023-06-26: qty 2

## 2023-06-26 MED ORDER — CHLORHEXIDINE GLUCONATE CLOTH 2 % EX PADS
6.0000 | MEDICATED_PAD | Freq: Every day | CUTANEOUS | Status: DC
  Administered 2023-06-26 – 2023-07-10 (×13): 6 via TOPICAL

## 2023-06-26 MED ORDER — STERILE WATER FOR INJECTION IV SOLN
INTRAVENOUS | Status: DC
  Filled 2023-06-26 (×2): qty 1000
  Filled 2023-06-26 (×5): qty 150

## 2023-06-26 MED ORDER — METRONIDAZOLE 500 MG/100ML IV SOLN: 500.0000 mg | Freq: Two times a day (BID) | INTRAVENOUS | Status: DC

## 2023-06-26 MED ORDER — VANCOMYCIN HCL 250 MG PO CAPS
500.0000 mg | ORAL_CAPSULE | Freq: Four times a day (QID) | ORAL | Status: AC
  Administered 2023-06-26 – 2023-07-10 (×56): 500 mg via ORAL
  Filled 2023-06-26 (×18): qty 2
  Filled 2023-06-26: qty 4
  Filled 2023-06-26 (×10): qty 2
  Filled 2023-06-26 (×2): qty 4
  Filled 2023-06-26 (×24): qty 2
  Filled 2023-06-26: qty 4
  Filled 2023-06-26: qty 2
  Filled 2023-06-26: qty 4
  Filled 2023-06-26 (×4): qty 2

## 2023-06-26 MED ORDER — OXYCODONE HCL 5 MG PO TABS
5.0000 mg | ORAL_TABLET | Freq: Four times a day (QID) | ORAL | Status: AC | PRN
  Administered 2023-06-26 – 2023-06-28 (×4): 5 mg via ORAL
  Filled 2023-06-26 (×4): qty 1

## 2023-06-26 MED ORDER — FENTANYL CITRATE PF 50 MCG/ML IJ SOSY
50.0000 ug | PREFILLED_SYRINGE | Freq: Once | INTRAMUSCULAR | Status: AC
  Administered 2023-06-26: 50 ug via INTRAVENOUS
  Filled 2023-06-26: qty 1

## 2023-06-26 MED ORDER — HEPARIN (PORCINE) 25000 UT/250ML-% IV SOLN
1500.0000 [IU]/h | INTRAVENOUS | Status: DC
  Administered 2023-06-26: 1300 [IU]/h via INTRAVENOUS
  Filled 2023-06-26: qty 250

## 2023-06-26 MED ORDER — ORAL CARE MOUTH RINSE: 15.0000 mL | OROMUCOSAL | Status: DC | PRN

## 2023-06-26 MED ORDER — DOCUSATE SODIUM 100 MG PO CAPS: 100.0000 mg | ORAL_CAPSULE | Freq: Two times a day (BID) | ORAL | Status: DC | PRN

## 2023-06-26 MED ORDER — INSULIN ASPART 100 UNIT/ML IJ SOLN
0.0000 [IU] | Freq: Three times a day (TID) | INTRAMUSCULAR | Status: DC
  Administered 2023-06-27: 3 [IU] via SUBCUTANEOUS
  Administered 2023-06-28: 1 [IU] via SUBCUTANEOUS
  Administered 2023-06-28: 2 [IU] via SUBCUTANEOUS
  Administered 2023-06-29: 1 [IU] via SUBCUTANEOUS
  Administered 2023-06-29: 2 [IU] via SUBCUTANEOUS
  Administered 2023-06-29 – 2023-06-30 (×3): 1 [IU] via SUBCUTANEOUS
  Administered 2023-06-30: 2 [IU] via SUBCUTANEOUS
  Administered 2023-07-01: 1 [IU] via SUBCUTANEOUS
  Administered 2023-07-01 (×2): 3 [IU] via SUBCUTANEOUS
  Administered 2023-07-02 – 2023-07-03 (×2): 1 [IU] via SUBCUTANEOUS
  Administered 2023-07-03 – 2023-07-05 (×2): 2 [IU] via SUBCUTANEOUS
  Administered 2023-07-05: 3 [IU] via SUBCUTANEOUS
  Administered 2023-07-06 – 2023-07-08 (×5): 1 [IU] via SUBCUTANEOUS
  Administered 2023-07-09: 2 [IU] via SUBCUTANEOUS
  Administered 2023-07-09: 1 [IU] via SUBCUTANEOUS
  Administered 2023-07-10: 2 [IU] via SUBCUTANEOUS

## 2023-06-26 NOTE — Progress Notes (Signed)
 PHARMACY - ANTICOAGULATION CONSULT NOTE  Pharmacy Consult for heparin Indication: atrial fibrillation  No Known Allergies  Patient Measurements: Height: 5\' 9"  (175.3 cm) Weight: 101.6 kg (223 lb 15.8 oz) IBW/kg (Calculated) : 70.7 Heparin Dosing Weight: 92 kg  Vital Signs: Temp: 97.6 F (36.4 C) (02/21 1422) Temp Source: Oral (02/21 1016) BP: 157/140 (02/21 1630) Pulse Rate: 66 (02/21 1630)  Labs: Recent Labs    06/26/23 1045  HGB 8.9*  HCT 29.9*  PLT 344  CREATININE 10.80*    Estimated Creatinine Clearance: 7.5 mL/min (A) (by C-G formula based on SCr of 10.8 mg/dL (H)).   Medical History: Past Medical History:  Diagnosis Date   Arthritis    CKD (chronic kidney disease) stage 3, GFR 30-59 ml/min (HCC)    Diabetes mellitus without complication (HCC)    Hyperlipidemia    Hypertension      Assessment: 73 year old male on Eliquis for afib. Patient recently admitted with left-sided hydronephrosis secondary to several calculi, nephrostomy tubed placed. Patient began to experience worsening weakness associated with N/V/D. Patient presented to ED, was hypotensive. Imaging concerning for colitis, C.diff antigen and toxin positive. Found to have AKI on CKD. Eliquis on hold - pharmacy consulted to manage heparin infusion.  Last dose of Eliquis reported 2/20 at 2000.  Goal of Therapy:  Heparin level 0.3-0.7 units/ml aPTT 66-102 Monitor platelets by anticoagulation protocol: Yes   Plan:  -Start heparin infusion at 1300 units/hr -Check 8 hour aPTT - will follow aPTT until correlation with heparin level -Daily CBC and heparin level -Monitor for signs/symptoms of bleeding  Pricilla Riffle, PharmD, BCPS Clinical Pharmacist 06/26/2023 6:13 PM

## 2023-06-26 NOTE — H&P (Signed)
 NAME:  Calvin Schultz, MRN:  [id number], DOB:  1950/07/13, LOS: 0 ADMISSION DATE:  06/26/2023, CONSULTATION DATE: 2/21 REFERRING MD: Dr. Anitra Lauth, CHIEF COMPLAINT: Renal failure, hypotension  History of Present Illness:  73 year old male with past medical history as below, which is significant for atrial fibrillation on Eliquis, hypertension, diabetes mellitus, CKD, and recently admitted for acute on chronic renal failure with left-sided hydronephrosis secondary to several calculi in the left renal collecting system with possible staghorn calculus concerning for left UPJ stricture.  Nephrostomy tube was placed on the left and the patient was discharged with the nephrostomy tube as well as ongoing antibiotics transitioned from ceftriaxone to Bactrim p.o.  Also treated for lower extremity edema during that admission with aggressive IV Lasix diuresis.  Initial post discharge course was unremarkable, but ultimately the patient began to experience worsening weakness associated with nausea, vomiting, and diarrhea as well as left lower quadrant pain.  He has some difficulty walking at baseline due to knee pain but as a result of the mentioned symptoms he became unable to walk at all due to weakness.  This is what his family called EMS and he was transported to [facility] emergency department on 2/21.  Upon arrival to the emergency department he was noted to be hypotensive and IV fluid resuscitation was initiated.  Initially blood pressure did respond to IV fluids, however, he was ultimately required to be started on norepinephrine infusion.  Workup in the ED included CT scan which was concerning for colitis of the descending and sigmoid colon.  C. difficile antigen and toxin positive.  He was started on appropriate antibiotics.  PCCM was asked to evaluate in the setting of hypotension.  Pertinent  Medical History   has a past medical history of Arthritis, CKD (chronic kidney disease) stage 3, GFR 30-59 ml/min  (HCC), Diabetes mellitus without complication (HCC), Hyperlipidemia, and Hypertension.   Significant Hospital Events: Including procedures, antibiotic start and stop dates in addition to other pertinent events   1/31-2/5 admit to [facility] for acute on chronic renal failure. Left UJP stone, hydro. Nephrostomy tube placed.  2/21 admitted to [facility] with shock, dehydration, and acute renal failure.   Interim History / Subjective:    Objective   Blood pressure (!) 103/46, pulse 70, temperature 97.6 F (36.4 C), resp. rate 16, height 5\' 9"  (1.753 m), weight 101.6 kg, SpO2 100%.       No intake or output data in the 24 hours ending 06/26/23 1550 Filed Weights   06/26/23 1017  Weight: 101.6 kg    Examination: General: Older adult male in NAD resting comfortably in ED stretcher HENT: [place]/AT, PERRL, no JVD Lungs: Clear bilateral breath sounds, diminished Cardiovascular: IRIR, rate 60s, no MRG. 3+ bilateral lower extremity pitting edema.  Abdomen: Soft, NT, ND. L nephrostomy tube with amber urinary output.  Extremities: No acute deformity.  Neuro: Alert, oriented, non-focal. Somewhat hard of hearing.   Labs show severe leukocytosis with left shift, lactate 1.2, mild anemia hemoglobin 8.9 Hyponatremia, BUN/creatinine 96/10.8 with normal potassium  Resolved Hospital Problem list     Assessment & Plan:   Shock: hypovolemic vs septic -Has received 3 L of fluids, will continue IV fluids at 150 an hour -Use low-dose peripheral Levophed as needed to maintain MAP 65 and above  AKI superimposed on CKD stage IV, baseline creatinine 4.9 Status post left nephrostomy 1/29 by IR with plan for ureteric stent placement 3/21-has staghorn calculi on left -No immediate indication for dialysis -See if  urine output, renal function improves with fluids -Adding bicarbonate at 100 cc/h -Stop ARB. -Concern for lower extremity edema and will need diuretics eventually if not dialyzed  C. difficile colitis -He  does not seem to have been on long-term antibiotics, complains of diarrhea for a year -Will treat with oral vancomycin and Flagyl  Chronic atrial fibrillation -on Eliquis -Switch to IV heparin in anticipation  Diabetes type 2 -may have lowered insulin requirements -SSI   Best Practice (right click and "Reselect all SmartList Selections" daily)   Diet/type: Regular consistency (see orders) DVT prophylaxis DOAC Pressure ulcer(s): N/A GI prophylaxis: N/A Lines: N/A Foley:  N/A Code Status:  full code Last date of multidisciplinary goals of care discussion [NA]  Labs   CBC: Recent Labs  Lab 06/26/23 1045  WBC 29.5*  NEUTROABS 26.1*  HGB 8.9*  HCT 29.9*  MCV 89.3  PLT 344    Basic Metabolic Panel: Recent Labs  Lab 06/26/23 1045  NA 129*  K 4.3  CL 105  CO2 10*  GLUCOSE 95  BUN 96*  CREATININE 10.80*  CALCIUM 7.9*   GFR: Estimated Creatinine Clearance: 7.5 mL/min (A) (by C-G formula based on SCr of 10.8 mg/dL (H)). Recent Labs  Lab 06/26/23 1045 06/26/23 1327  WBC 29.5*  --   LATICACIDVEN  --  1.2    Liver Function Tests: No results for input(s): "AST", "ALT", "ALKPHOS", "BILITOT", "PROT", "ALBUMIN" in the last 168 hours. No results for input(s): "LIPASE", "AMYLASE" in the last 168 hours. No results for input(s): "AMMONIA" in the last 168 hours.  ABG No results found for: "PHART", "PCO2ART", "PO2ART", "HCO3", "TCO2", "ACIDBASEDEF", "O2SAT"   Coagulation Profile: No results for input(s): "INR", "PROTIME" in the last 168 hours.  Cardiac Enzymes: No results for input(s): "CKTOTAL", "CKMB", "CKMBINDEX", "TROPONINI" in the last 168 hours.  HbA1C: No results found for: "HGBA1C"  CBG: No results for input(s): "GLUCAP" in the last 168 hours.  Review of Systems:   Diarrhea for a year Vomiting 3 days No abdominal pain, fevers No shortness of breath, chest pain  Past Medical History:  He,  has a past medical history of Arthritis, CKD (chronic kidney  disease) stage 3, GFR 30-59 ml/min (HCC), Diabetes mellitus without complication (HCC), Hyperlipidemia, and Hypertension.   Surgical History:   Past Surgical History:  Procedure Laterality Date   IR NEPHROSTOMY PLACEMENT LEFT  06/03/2023   KIDNEY SURGERY       Social History:   reports that he has never smoked. He has never used smokeless tobacco. He reports that he does not drink alcohol and does not use drugs.   Family History:  His family history includes Dementia in his mother.   Allergies No Known Allergies   Home Medications  Prior to Admission medications   Medication Sig Start Date End Date Taking? Authorizing Provider  amLODipine (NORVASC) 5 MG tablet Take 1 tablet (5 mg total) by mouth daily. 06/05/23  Yes Alexander-Savino, [place], MD  ELIQUIS 5 MG TABS tablet Take 5 mg by mouth 2 (two) times daily. 08/24/21  Yes [provider]  LEVEMIR FLEXTOUCH 100 UNIT/ML FlexTouch Pen Inject 10 Units into the skin as needed (high blood sugar). 06/27/21  Yes [provider]  olmesartan (BENICAR) 20 MG tablet Take 20 mg by mouth daily.   Yes [provider]  rosuvastatin (CRESTOR) 40 MG tablet Take 40 mg by mouth daily. 08/27/21  Yes [provider]  lanthanum (FOSRENOL) 750 MG chewable tablet Chew by mouth.  Patient not taking: Reported on 06/26/2023    [provider]  sevelamer carbonate (RENVELA) 800 MG tablet Take 1 tablet (800 mg total) by mouth 3 (three) times daily with meals. Patient not taking: Reported on 06/26/2023 06/05/23   Manuela Neptune, MD     Critical care time: 71m      Cyril Mourning MD. [facility]. [facility] Pager : [phone]  If no response to pager , please call [phone] until 7 pm After 7:00 pm call Elink  [phone]   06/26/2023

## 2023-06-26 NOTE — ED Triage Notes (Signed)
 Pt c/o left groin pain that started yesterday, diagnosed with kidney stones and had nephrostomy tube placed approx. 3 weeks ago, pt noted less output from drain, no change in color

## 2023-06-26 NOTE — ED Provider Notes (Addendum)
 [place] EMERGENCY DEPARTMENT AT [facility] Provider Note   CSN: [id number] Arrival date & time: 06/26/23  1009     History  Chief Complaint  Patient presents with   Groin Pain    Calvin Schultz is a 73 y.o. male.  Patient is a 73 year old male with a history of diabetes, hypertension, atrial fibrillation on Eliquis, hyperlipidemia, chronic kidney disease stage III status post left nephrostomy tube placement on 06/03/2023 for persistent hydronephrosis, worsening AKI with a staghorn calculus and a UPJ stone 13 x 15 mm who presents today to the emergency room for worsening left-sided groin and abdominal pain that started yesterday.  He has been taking Tylenol as needed for the pain but it is not helping.  He has not had any nausea or vomiting.  He is continued to have some bloody urine in his nephrostomy tube.  He denies any back to hip pain or pain around the tube site.  He denies any fevers.  He denies any pain in his testicle or leg.  Pt also c/o of diarrhea over the last multiple days and he has also been on abx.  The history is provided by the patient and medical records.  Groin Pain       Home Medications Prior to Admission medications   Medication Sig Start Date End Date Taking? Authorizing Provider  amLODipine (NORVASC) 5 MG tablet Take 1 tablet (5 mg total) by mouth daily. 06/05/23  Yes Alexander-Savino, [place], MD  ELIQUIS 5 MG TABS tablet Take 5 mg by mouth 2 (two) times daily. 08/24/21  Yes [provider]  LEVEMIR FLEXTOUCH 100 UNIT/ML FlexTouch Pen Inject 10 Units into the skin as needed (high blood sugar). 06/27/21  Yes [provider]  olmesartan (BENICAR) 20 MG tablet Take 20 mg by mouth daily.   Yes [provider]  rosuvastatin (CRESTOR) 40 MG tablet Take 40 mg by mouth daily. 08/27/21  Yes [provider]  lanthanum (FOSRENOL) 750 MG chewable tablet Chew by mouth. Patient not taking: Reported on 06/26/2023    [provider]  sevelamer carbonate (RENVELA) 800 MG tablet Take 1 tablet (800 mg total) by mouth 3 (three) times daily with meals. Patient not taking: Reported on 06/26/2023 06/05/23   Manuela Neptune, MD      Allergies    Patient has no known allergies.    Review of Systems   Review of Systems  Physical Exam Updated Vital Signs BP (!) 89/51   Pulse 68   Temp 97.7 F (36.5 C) (Oral)   Resp (!) 22   Ht 5\' 9"  (1.753 m)   Wt 101.6 kg   SpO2 100%   BMI 33.08 kg/m  Physical Exam Vitals and nursing note reviewed.  Constitutional:      General: He is not in acute distress.    Appearance: He is well-developed. He is ill-appearing.  HENT:     Head: Normocephalic and atraumatic.  Eyes:     Conjunctiva/sclera: Conjunctivae normal.     Pupils: Pupils are equal, round, and reactive to light.  Cardiovascular:     Rate and Rhythm: Normal rate. Rhythm irregularly irregular.     Pulses: Normal pulses.     Heart sounds: No murmur heard. Pulmonary:     Effort: Pulmonary effort is normal. No respiratory distress.     Breath sounds: Normal breath sounds. No wheezing or rales.  Abdominal:     General: There is no distension.     Palpations: Abdomen  is soft.     Tenderness: There is abdominal tenderness in the left lower quadrant. There is no left CVA tenderness, guarding or rebound.     Comments: Nephrostomy tube in place draining bloody urine  Musculoskeletal:        General: No tenderness. Normal range of motion.     Cervical back: Normal range of motion and neck supple.     Right lower leg: Edema present.     Left lower leg: Edema present.     Comments: 2+ pitting edema bilateral lower extremities.  Skin:    General: Skin is warm and dry.     Capillary Refill: Capillary refill takes less than 2 seconds.     Findings: No erythema or rash.  Neurological:     Mental Status: He is alert and oriented to person, place, and time. Mental status is at baseline.  Psychiatric:         Behavior: Behavior normal.     ED Results / Procedures / Treatments   Labs (all labs ordered are listed, but only abnormal results are displayed) Labs Reviewed  C DIFFICILE QUICK SCREEN W PCR REFLEX   - Abnormal; Notable for the following components:      Result Value   C Diff antigen POSITIVE (*)    C Diff toxin POSITIVE (*)    All other components within normal limits  CBC WITH DIFFERENTIAL/PLATELET - Abnormal; Notable for the following components:   WBC 29.5 (*)    RBC 3.35 (*)    Hemoglobin 8.9 (*)    HCT 29.9 (*)    MCHC 29.8 (*)    Neutro Abs 26.1 (*)    Monocytes Absolute 2.4 (*)    Abs Immature Granulocytes 0.23 (*)    All other components within normal limits  BASIC METABOLIC PANEL - Abnormal; Notable for the following components:   Sodium 129 (*)    CO2 10 (*)    BUN 96 (*)    Creatinine, Ser 10.80 (*)    Calcium 7.9 (*)    GFR, Estimated 5 (*)    All other components within normal limits  I-STAT CG4 LACTIC ACID, ED  I-STAT CG4 LACTIC ACID, ED    EKG None  Radiology CT Renal Stone Study Result Date: 06/26/2023 CLINICAL DATA:  Acute left groin pain. EXAM: CT ABDOMEN AND PELVIS WITHOUT CONTRAST TECHNIQUE: Multidetector CT imaging of the abdomen and pelvis was performed following the standard protocol without IV contrast. RADIATION DOSE REDUCTION: This exam was performed according to the departmental dose-optimization program which includes automated exposure control, adjustment of the mA and/or kV according to patient size and/or use of iterative reconstruction technique. COMPARISON:  May 30, 2023. FINDINGS: Lower chest: Minimal left posterior basilar subsegmental atelectasis is noted. Hepatobiliary: Small gallstone is noted. No biliary dilatation is noted. Liver is unremarkable on these unenhanced images. Pancreas: Unremarkable. No pancreatic ductal dilatation or surrounding inflammatory changes. Spleen: Normal in size without focal abnormality.  Adrenals/Urinary Tract: Adrenal glands appear normal. Bilateral nephrolithiasis is noted. Percutaneous left nephrostomy is noted. No hydronephrosis or renal obstruction is noted. Urinary bladder is unremarkable. Stomach/Bowel: The stomach and appendix are unremarkable. There is no evidence of bowel obstruction. Mild to moderate wall thickening of descending and sigmoid colon is noted concerning for infectious or inflammatory colitis. Vascular/Lymphatic: Aortic atherosclerosis. No enlarged abdominal or pelvic lymph nodes. Reproductive: Prostate is unremarkable. Other: No abdominal wall hernia or abnormality. No abdominopelvic ascites. Musculoskeletal: No acute or significant osseous findings. IMPRESSION: Bilateral  nonobstructive nephrolithiasis. Percutaneous left nephrostomy is noted. Mild to moderate wall thickening of descending and sigmoid colon is noted concerning for infectious or inflammatory colitis. Aortic Atherosclerosis (ICD10-I70.0). Electronically Signed   By: Lupita Raider M.D.   On: 06/26/2023 12:42    Procedures Procedures    Medications Ordered in ED Medications  lactated ringers infusion ( Intravenous New Bag/Given 06/26/23 1343)  lactated ringers bolus 500 mL (0 mLs Intravenous Stopped 06/26/23 1147)  fentaNYL (SUBLIMAZE) injection 50 mcg (50 mcg Intravenous Given 06/26/23 1047)  ondansetron (ZOFRAN) injection 4 mg (4 mg Intravenous Given 06/26/23 1147)  lactated ringers bolus 1,000 mL (1,000 mLs Intravenous New Bag/Given 06/26/23 1243)  lactated ringers bolus 500 mL (500 mLs Intravenous New Bag/Given 06/26/23 1343)  fentaNYL (SUBLIMAZE) injection 50 mcg (50 mcg Intravenous Given 06/26/23 1340)    ED Course/ Medical Decision Making/ A&P                                 Medical Decision Making Amount and/or Complexity of Data Reviewed Independent Historian: spouse External Data Reviewed: notes. Labs: ordered. Decision-making details documented in ED Course. Radiology: ordered and  independent interpretation performed. Decision-making details documented in ED Course.  Risk Prescription drug management.   Pt with multiple medical problems and comorbidities and presenting today with a complaint that caries a high risk for morbidity and mortality.  Here today with worsening left lower quadrant pain.  Possibility this could be the known UPJ stone moving versus complication with recent nephrostomy tube which was placed versus developing infection.  No findings to suggest DVT or cellulitis.  No testicular pathology at this time.  Patient was noted to be slightly hypotensive so also concern for developing sepsis also concerning for worsening AKI.  Will give IV fluids, pain control.  Labs and imaging are pending. Patient's family members arrived and report all this week he has had significant diarrhea and some vomiting which he related to Renvela which she recently started this week.  However he is also been on antibiotics and concerned that patient may have C. Difficile.   1:47 PM Patient is responsive to fluids but when bolus is finished blood pressure drops again.  I independently interpreted patient's labs showed a CBC with a white count of 29, stable hemoglobin of 8.9, Actiq acid is within normal limits, BMP today with acute kidney failure with a creatinine of 10.8 from in the 4 range when he was here last and mild hyponatremia of 129.  Potassium is normal.  C. difficile toxin and antigen are positive.  Feel that patient will need treatment for C. difficile.  I have independently visualized and interpreted pt's images today.  Renal CT shows appropriate tube placement with no evidence of hydronephrosis.  Radiology reports mild to moderate wall thickening of the descending and sigmoid colon concerning for infectious or inflammatory colitis.  Discussed these results with the patient and his family.  Will continue IV fluids as he seems to be responsive at this time.  Will start dificid.   Will need admission to the hospital.  1:52 PM Spoke with Dr. Arlean Hopping with nephrology who feels pt can stay at [facility] but needs aggressive fluid resusication and they will consult.  Started oral vanc and IV flagyl.  Also patient started to become more persistently hypotensive.  He was started on Levophed.  At this time it was decided that he needed critical care and ICU admission.  CRITICAL CARE Performed by: Hildred Pharo Total critical care time: 45 minutes Critical care time was exclusive of separately billable procedures and treating other patients. Critical care was necessary to treat or prevent imminent or life-threatening deterioration. Critical care was time spent personally by me on the following activities: development of treatment plan with patient and/or surrogate as well as nursing, discussions with consultants, evaluation of patient's response to treatment, examination of patient, obtaining history from patient or surrogate, ordering and performing treatments and interventions, ordering and review of laboratory studies, ordering and review of radiographic studies, pulse oximetry and re-evaluation of patient's condition.          Final Clinical Impression(s) / ED Diagnoses Final diagnoses:  Clostridium difficile colitis  Acute renal failure superimposed on stage 3 chronic kidney disease, unspecified acute renal failure type, unspecified whether stage 3a or 3b CKD (HCC)  Hypotension due to hypovolemia    Rx / DC Orders ED Discharge Orders     None         Gwyneth Sprout, MD 06/26/23 1347    Gwyneth Sprout, MD 06/26/23 1353    Gwyneth Sprout, MD 06/26/23 1355    Gwyneth Sprout, MD 06/26/23 1430

## 2023-06-26 NOTE — Consult Note (Addendum)
 Renal Service Consult Note [facility]  Calvin Schultz 06/26/2023 Calvin Krabbe, MD Requesting Physician: Dr. Anitra Schultz  Reason for Consult: Renal failure HPI: The patient is a 73 y.o. year-old w/ PMH as below who presented to ED today w/ L groin pain x 1 day. Had nephrostomy tube placed on the L about 3 wks ago. No n/v. No back pain or fevers. Has had diarrhea over the last week per family and has been taking abx. Here he is + for Cdif. In ED BP's in the 70's, responds to IVF"s but then drops again. Creat 10 (b/l around 4-5). Renal CT today showed good L neph tube placement, also shows sigmoid/ descending colitis. Started on dificid and was given IVF's. Eventually his BP's kept dropping and he was started on levo gtt and CCM was called to admit. We are asked to see for renal failure.    Pt seen in ED room. Pt denies fatigue or loss of appetite for nausea , but his family members endorse all of these.  No confusion. No twitching or jerking of extremities. Main issue these past few days has been N/V and diarrhea. But the last 24 hrs he also was SOB, couldn't lie flat. His legs continue to be swollen, as during the last admission.    PMH CKD 3b DM2 HTN Staghorn renal calculus HL L nephrostomy tube 06/03/23 by IR   ROS - denies CP, no joint pain, no HA, no blurry vision, no rash, no diarrhea, no nausea/ vomiting, no dysuria, no difficulty voiding   Past Medical History  Past Medical History:  Diagnosis Date   Arthritis    CKD (chronic kidney disease) stage 3, GFR 30-59 ml/min (HCC)    Diabetes mellitus without complication (HCC)    Hyperlipidemia    Hypertension    Past Surgical History  Past Surgical History:  Procedure Laterality Date   IR NEPHROSTOMY PLACEMENT LEFT  06/03/2023   KIDNEY SURGERY     Family History  Family History  Problem Relation Age of Onset   Dementia Mother    Social History  reports that he has never smoked. He has never used smokeless  tobacco. He reports that he does not drink alcohol and does not use drugs. Allergies No Known Allergies Home medications Prior to Admission medications   Medication Sig Start Date End Date Taking? Authorizing Provider  amLODipine (NORVASC) 5 MG tablet Take 1 tablet (5 mg total) by mouth daily. 06/05/23  Yes Calvin Schultz, [place], MD  ELIQUIS 5 MG TABS tablet Take 5 mg by mouth 2 (two) times daily. 08/24/21  Yes [provider]  LEVEMIR FLEXTOUCH 100 UNIT/ML FlexTouch Pen Inject 10 Units into the skin as needed (high blood sugar). 06/27/21  Yes [provider]  olmesartan (BENICAR) 20 MG tablet Take 20 mg by mouth daily.   Yes [provider]  rosuvastatin (CRESTOR) 40 MG tablet Take 40 mg by mouth daily. 08/27/21  Yes [provider]  lanthanum (FOSRENOL) 750 MG chewable tablet Chew by mouth. Patient not taking: Reported on 06/26/2023    [provider]  sevelamer carbonate (RENVELA) 800 MG tablet Take 1 tablet (800 mg total) by mouth 3 (three) times daily with meals. Patient not taking: Reported on 06/26/2023 06/05/23   Calvin Neptune, MD     Vitals:   06/26/23 1330 06/26/23 1415 06/26/23 1422 06/26/23 1515  BP: (!) 83/50 98/80  (!) 103/46  Pulse: (!) 50 84  70  Resp: 17 17  16  Temp:   97.6 F (36.4 C)   TempSrc:      SpO2: 100% 98%  100%  Weight:      Height:       Exam Gen alert, no distress, 98-100% on RA No rash, cyanosis or gangrene Sclera anicteric, throat clear  +jvd at 40 deg bilat  Chest R clear, L +rales improved w/ deep breathing RRR no MRG Abd soft ntnd no mass or ascites +bs GU nl male MS no joint effusions or deformity Ext diffuse 2+ pretib edema bilat LE's, no other edema Neuro is alert, Ox 3 , nf, no asterixis, looks a bit tired      Renal-related home meds: - norvasc 5 - olmesartan 20 every day - fosrenol 750 mg ac tid - renvela 800 ac tid - others: insulin, eliquis      Date                              Creat               eGFR (ml/min) 04/18/2021                  1.87                 39 06/25/21                        2.00                 36 ml/min 07/2021                         1.73                 42 09/2021                         2.17                 32 12/2021                         2.51                 27 01/2022                         2.48                 28 ml/min, CKD 4 05/30/23- 06/05/23 4.43- 5.34 11- 14 ml/min 06/26/23  10.8  5 ml/min  UA pend UNa, UCr pend CT renal stone 2/21 - Adrenals/Urinary Tract: Adrenal glands appear normal. Bilateral nephrolithiasis is noted. Percutaneous left nephrostomy is noted. No hydronephrosis or renal obstruction is noted. Urinary bladder is unremarkable.   Na 129 K 4.3  CO2 10  BUN 96  creat 10.8    Assessment/ Plan: AKI on CKD 4 - b/l creat 4.5- 5.3 from jan 2025, eGFR 11-14 ml/min. Creat here is 10.8 in the setting on severe hypotension, new dx of Cdif (1 wk diarrhea, n/v), swollen legs and SOB. Has rec'd about 3 L bolus fluids in ED and does not look SOB, however w/ his leg swelling no more boluses to be given and they started levophed to rx his recurrent hypotension. Suspect AKI due to ATN vs hemodynamic related to hypotension +/- ARB +/-  intravasc vol depletion. Will be admitted to ICU. No acute indication for RRT at this time, but could change at anytime; would have low threshold for CRRT. Have d/w CCM. Will follow.  Metabolic acidosis - serum CO2 10, not symptomatic. Changing IVF"s to bicarb gtt at 100 cc/hr.  Shock/ hypotension - not sure cause , possibly related to colitis/ Cdif. Does not appear toxic. IV abx per CCM.  L nephrostomy tube - placed in Jan 2025 by IR, is f/b urology. Has staghorn calculi on the L and is supposed to have "surgery" per the wife on March 7th.  DM 2- on insulin Chronic atrial fib - on eliquis Bilat LE edema - presumably from renal failure vs heart failure, not sure which. Ordered CXR which is pending.  On RA.       Calvin Moselle  MD [facility] 06/26/2023, 3:43 PM  Recent Labs  Lab 06/26/23 1045  HGB 8.9*  CALCIUM 7.9*  CREATININE 10.80*  K 4.3   Inpatient medications:  vancomycin  500 mg Oral Q6H    lactated ringers 150 mL/hr at 06/26/23 1343   metronidazole 500 mg (06/26/23 1430)   norepinephrine (LEVOPHED) Adult infusion

## 2023-06-27 ENCOUNTER — Inpatient Hospital Stay (HOSPITAL_COMMUNITY): Payer: No Typology Code available for payment source

## 2023-06-27 ENCOUNTER — Encounter (HOSPITAL_COMMUNITY): Payer: Self-pay | Admitting: Pulmonary Disease

## 2023-06-27 DIAGNOSIS — N183 Chronic kidney disease, stage 3 unspecified: Secondary | ICD-10-CM | POA: Diagnosis not present

## 2023-06-27 DIAGNOSIS — I482 Chronic atrial fibrillation, unspecified: Secondary | ICD-10-CM | POA: Diagnosis not present

## 2023-06-27 DIAGNOSIS — N170 Acute kidney failure with tubular necrosis: Secondary | ICD-10-CM

## 2023-06-27 DIAGNOSIS — A0472 Enterocolitis due to Clostridium difficile, not specified as recurrent: Secondary | ICD-10-CM | POA: Diagnosis not present

## 2023-06-27 LAB — CBC
HCT: 32.3 % — ABNORMAL LOW (ref 39.0–52.0)
Hemoglobin: 9.7 g/dL — ABNORMAL LOW (ref 13.0–17.0)
MCH: 26.3 pg (ref 26.0–34.0)
MCHC: 30 g/dL (ref 30.0–36.0)
MCV: 87.5 fL (ref 80.0–100.0)
Platelets: 411 10*3/uL — ABNORMAL HIGH (ref 150–400)
RBC: 3.69 MIL/uL — ABNORMAL LOW (ref 4.22–5.81)
RDW: 14.6 % (ref 11.5–15.5)
WBC: 30 10*3/uL — ABNORMAL HIGH (ref 4.0–10.5)
nRBC: 0 % (ref 0.0–0.2)

## 2023-06-27 LAB — GLUCOSE, CAPILLARY
Glucose-Capillary: 86 mg/dL (ref 70–99)
Glucose-Capillary: 96 mg/dL (ref 70–99)
Glucose-Capillary: 169 mg/dL — ABNORMAL HIGH (ref 70–99)

## 2023-06-27 LAB — RENAL FUNCTION PANEL
Albumin: 1.9 g/dL — ABNORMAL LOW (ref 3.5–5.0)
Anion gap: 11 (ref 5–15)
BUN: 70 mg/dL — ABNORMAL HIGH (ref 8–23)
CO2: 31 mmol/L (ref 22–32)
Calcium: 5.9 mg/dL — CL (ref 8.9–10.3)
Chloride: 85 mmol/L — ABNORMAL LOW (ref 98–111)
Creatinine, Ser: 7.57 mg/dL — ABNORMAL HIGH (ref 0.61–1.24)
GFR, Estimated: 7 mL/min — ABNORMAL LOW (ref >60)
Glucose, Bld: 154 mg/dL — ABNORMAL HIGH (ref 70–99)
Phosphorus: 6.8 mg/dL — ABNORMAL HIGH (ref 2.5–4.6)
Potassium: 2.9 mmol/L — ABNORMAL LOW (ref 3.5–5.1)
Sodium: 127 mmol/L — ABNORMAL LOW (ref 135–145)

## 2023-06-27 LAB — PHOSPHORUS: Phosphorus: 9.8 mg/dL — ABNORMAL HIGH (ref 2.5–4.6)

## 2023-06-27 LAB — HEMOGLOBIN A1C
Hgb A1c MFr Bld: 6.2 % — ABNORMAL HIGH (ref 4.8–5.6)
Mean Plasma Glucose: 131.24 mg/dL

## 2023-06-27 LAB — MAGNESIUM: Magnesium: 2.3 mg/dL (ref 1.7–2.4)

## 2023-06-27 LAB — BASIC METABOLIC PANEL
Anion gap: 17 — ABNORMAL HIGH (ref 5–15)
BUN: 91 mg/dL — ABNORMAL HIGH (ref 8–23)
CO2: 10 mmol/L — ABNORMAL LOW (ref 22–32)
Calcium: 7.6 mg/dL — ABNORMAL LOW (ref 8.9–10.3)
Chloride: 103 mmol/L (ref 98–111)
Creatinine, Ser: 10.53 mg/dL — ABNORMAL HIGH (ref 0.61–1.24)
GFR, Estimated: 5 mL/min — ABNORMAL LOW (ref >60)
Glucose, Bld: 86 mg/dL (ref 70–99)
Potassium: 3.8 mmol/L (ref 3.5–5.1)
Sodium: 130 mmol/L — ABNORMAL LOW (ref 135–145)

## 2023-06-27 LAB — HEPARIN LEVEL (UNFRACTIONATED): Heparin Unfractionated: >1.1 [IU]/mL — ABNORMAL HIGH (ref 0.30–0.70)

## 2023-06-27 LAB — APTT: aPTT: 58 s — ABNORMAL HIGH (ref 24–36)

## 2023-06-27 MED ORDER — APIXABAN 2.5 MG PO TABS
2.5000 mg | ORAL_TABLET | Freq: Two times a day (BID) | ORAL | Status: DC
  Administered 2023-06-27 – 2023-07-10 (×27): 2.5 mg via ORAL
  Filled 2023-06-27 (×27): qty 1

## 2023-06-27 MED ORDER — TIZANIDINE HCL 4 MG PO TABS
4.0000 mg | ORAL_TABLET | Freq: Once | ORAL | Status: AC
  Administered 2023-06-27: 4 mg via ORAL
  Filled 2023-06-27: qty 1

## 2023-06-27 MED ORDER — NOREPINEPHRINE 4 MG/250ML-% IV SOLN
2.0000 ug/min | INTRAVENOUS | Status: DC
  Administered 2023-06-27: 8 ug/min via INTRAVENOUS
  Administered 2023-06-27: 10 ug/min via INTRAVENOUS
  Filled 2023-06-27: qty 250

## 2023-06-27 MED ORDER — PRISMASOL BGK 4/2.5 32-4-2.5 MEQ/L EC SOLN: Status: DC

## 2023-06-27 MED ORDER — SODIUM BICARBONATE 8.4 % IV SOLN
100.0000 meq | Freq: Once | INTRAVENOUS | Status: AC
  Administered 2023-06-27: 100 meq via INTRAVENOUS
  Filled 2023-06-27: qty 100

## 2023-06-27 MED ORDER — SODIUM CHLORIDE 0.9 % IV SOLN: 250.0000 mL | INTRAVENOUS | Status: AC

## 2023-06-27 MED ORDER — SODIUM CHLORIDE 0.9 % IV SOLN
500.0000 [IU]/h | INTRAVENOUS | Status: DC
  Administered 2023-06-27 – 2023-06-30 (×5): 500 [IU]/h via INTRAVENOUS_CENTRAL
  Filled 2023-06-27 (×5): qty 10000

## 2023-06-27 MED ORDER — PHENTOLAMINE MESYLATE 5 MG IJ SOLR
5.0000 mg | Freq: Once | INTRAMUSCULAR | Status: DC
  Filled 2023-06-27: qty 5

## 2023-06-27 MED ORDER — CALCIUM GLUCONATE-NACL 1-0.675 GM/50ML-% IV SOLN
1.0000 g | Freq: Once | INTRAVENOUS | Status: AC
  Administered 2023-06-27: 1000 mg via INTRAVENOUS
  Filled 2023-06-27: qty 50

## 2023-06-27 MED ORDER — HEPARIN SODIUM (PORCINE) 1000 UNIT/ML DIALYSIS
1000.0000 [IU] | INTRAMUSCULAR | Status: DC | PRN
  Filled 2023-06-27: qty 6

## 2023-06-27 MED ORDER — PRISMASOL BGK 4/2.5 32-4-2.5 MEQ/L EC SOLN
Status: DC
Start: 2023-06-27 — End: 2023-06-30

## 2023-06-27 MED ORDER — ALBUMIN HUMAN 5 % IV SOLN
25.0000 g | Freq: Once | INTRAVENOUS | Status: AC
  Administered 2023-06-27: 25 g via INTRAVENOUS
  Filled 2023-06-27: qty 500

## 2023-06-27 MED ORDER — NITROGLYCERIN 2 % TD OINT
1.0000 [in_us] | TOPICAL_OINTMENT | Freq: Three times a day (TID) | TRANSDERMAL | Status: AC
  Administered 2023-06-27 – 2023-06-29 (×6): 1 [in_us] via TOPICAL
  Filled 2023-06-27 (×4): qty 1

## 2023-06-27 MED ORDER — NOREPINEPHRINE 4 MG/250ML-% IV SOLN
0.0000 ug/min | INTRAVENOUS | Status: DC
  Administered 2023-06-27: 10 ug/min via INTRAVENOUS
  Administered 2023-06-27: 8 ug/min via INTRAVENOUS
  Administered 2023-06-28: 9 ug/min via INTRAVENOUS
  Administered 2023-06-28: 6 ug/min via INTRAVENOUS
  Administered 2023-06-28: 9 ug/min via INTRAVENOUS
  Administered 2023-06-29: 6 ug/min via INTRAVENOUS
  Administered 2023-06-29: 7 ug/min via INTRAVENOUS
  Filled 2023-06-27 (×6): qty 250

## 2023-06-27 NOTE — Progress Notes (Addendum)
 [facility] Progress Note  Subjective:  Creat up, will need CRRT  Vitals:   06/27/23 0915 06/27/23 0930 06/27/23 0945 06/27/23 1000  BP: 138/60 (!) 136/53 123/66 (!) 118/48  Pulse: 76 71 70 73  Resp: 14 11 11 10   Temp:      TempSrc:      SpO2: 97% 98% 100% 99%  Weight:      Height:        Exam: Gen alert, no distress, 98-100% on RA No rash, cyanosis or gangrene Sclera anicteric, throat clear  +jvd at 40 deg bilat  Chest R clear, L +rales improved w/ deep breathing RRR no MRG Abd soft ntnd no mass or ascites +bs GU nl male MS no joint effusions or deformity Ext diffuse 2+ pretib edema bilat LE's, no other edema Neuro is alert, Ox 3 , nf, no asterixis, looks a bit tired       Renal-related home meds: - norvasc 5 - olmesartan 20 every day - fosrenol 750 mg ac tid - renvela 800 ac tid - others: insulin, eliquis       Date                             Creat               eGFR (ml/min) 04/18/2021                  1.87                 39 06/25/21                        2.00                 36 ml/min 07/2021                         1.73                 42 09/2021                         2.17                 32 12/2021                         2.51                 27 01/2022                         2.48                 28 ml/min, CKD 4 05/30/23- 06/05/23 4.43- 5.34 11- 14 ml/min 06/26/23                        10.8                 5 ml/min   UA - prot > 300, mod Hb, large LE, 11-20 rbcs > 50 wbc, 0-5 epi UNa 114, UCr 20 CT renal stone 2/21 - Adrenals/Urinary Tract: Adrenal glands appear normal. Bilateral nephrolithiasis is noted. Percutaneous left nephrostomy is noted. No hydronephrosis or renal obstruction is noted. Urinary bladder is unremarkable.  CXR 2/21 - IMPRESSION:  1. No acute intrathoracic process.       Assessment/ Plan: AKI on CKD 4 - b/l creat 4.5- 5.3 from jan 2025, eGFR 11-14 ml/min. Creat here is 10.8 in the setting of hypotension, new dx of Cdif  (1 wk diarrhea, n/v), swollen legs and SOB. Has rec'd about 3 L bolus fluids in ED and does not look SOB, however due to his leg swelling no more boluses to be given and they started levophed to treatment the hypotension. UA wbc > rbc, prot > 300. CT w/o obstruction, has L neph tube. Suspect AKI is hemodynamic related to hypotension +/- ARB +/- intravasc vol depletion. ATN is possibility. Pt admitted to ICU. Creat no better, due to sig azotemia and metabolic acidosis would recommend CRRT. Have d/w CCM. Will follow.  Metabolic acidosis - serum CO2 10, plan is for CRRT Shock/ hypotension - possibly related to colitis/ Cdif. IV abx per CCM.  L nephrostomy tube - placed in Jan 2025 by IR, and is f/b urology. Has staghorn calculi on the L and is supposed to have "surgery" (per the wife) on March 7th.  DM 2- on insulin Chronic atrial fib - on eliquis Bilat LE edema - from venous insufficiency vs renal failure vs heart failure. Is on RA, CXR negative from 2/21. Vinson Moselle MD  [facility] 06/27/2023, 10:49 AM  Recent Labs  Lab 06/26/23 1045 06/26/23 1847 06/27/23 0245  HGB 8.9*  --  9.7*  CALCIUM 7.9* 7.8* 7.6*  PHOS  --   --  9.8*  CREATININE 10.80* 10.49* 10.53*  K 4.3 4.2 3.8   No results for input(s): "IRON", "TIBC", "FERRITIN" in the last 168 hours. Inpatient medications:  Chlorhexidine Gluconate Cloth  6 each Topical Daily   insulin aspart  0-9 Units Subcutaneous TID WC   vancomycin  500 mg Oral Q6H    sodium chloride     heparin 10,000 units/ 20 mL infusion syringe     heparin Stopped (06/27/23 0817)   metronidazole     norepinephrine (LEVOPHED) Adult infusion 8 mcg/min (06/27/23 1000)   prismasol BGK 4/2.5     prismasol BGK 4/2.5     prismasol BGK 4/2.5     sodium bicarbonate 150 mEq in sterile water 1,150 mL infusion 150 mL/hr at 06/27/23 1000   heparin, mouth rinse, oxyCODONE

## 2023-06-27 NOTE — TOC Initial Note (Signed)
 Transition of Care [facility]) - Initial/Assessment Note    Patient Details  Name: Calvin Schultz MRN: [id number] Date of Birth: 1951/02/09  Transition of Care [facility]) CM/SW Contact:    Adrian Prows, RN Phone Number: 06/27/2023, 1:02 PM  Clinical Narrative:                 TOC for d/c planning; pt in procedure; called his wife Luvern Mcisaac [phone]); pt's wife says he lives at home; she plans for him to return at d/c; pt's wife says she will provide transportation; she verified insurance/PCP; she denies pt experiencing SDOH risks; pt's wife says he has cane and walker; he does not receive HH services or home oxygen; TOC will follow.  Expected Discharge Plan: Home/Self Care Barriers to Discharge: Continued Medical Work up   Patient Goals and CMS Choice Patient states their goals for this hospitalization and ongoing recovery are:: pt's wife Aidan Moten says he will return home CMS Medicare.gov Compare Post Acute Care list provided to:: Patient Represenative (must comment) Nettie Elm Technical brewer (spouse))        Expected Discharge Plan and Services   Discharge Planning Services: CM Consult   Living arrangements for the past 2 months: Single Family Home                                      Prior Living Arrangements/Services Living arrangements for the past 2 months: Single Family Home Lives with:: Spouse Patient language and need for interpreter reviewed:: Yes Do you feel safe going back to the place where you live?: Yes      Need for Family Participation in Patient Care: Yes (Comment) Care giver support system in place?: Yes (comment) Current home services: DME (cane, walker) Criminal Activity/Legal Involvement Pertinent to Current Situation/Hospitalization: No - Comment as needed  Activities of Daily Living   ADL Screening (condition at time of admission) Independently performs ADLs?: Yes (appropriate for developmental age) Is the patient deaf or have difficulty  hearing?: Yes Does the patient have difficulty seeing, even when wearing glasses/contacts?: No Does the patient have difficulty concentrating, remembering, or making decisions?: No  Permission Sought/Granted Permission sought to share information with : Case Manager Permission granted to share information with : Yes, Verbal Permission Granted  Share Information with NAME: Case Manger     Permission granted to share info w Relationship: Albie Arizpe (spouse) [phone]     Emotional Assessment Appearance:: Appears stated age Attitude/Demeanor/Rapport: Unable to Assess Affect (typically observed): Unable to Assess Orientation: :  (unable to assess) Alcohol / Substance Use: Not Applicable Psych Involvement: No (comment)  Admission diagnosis:  Clostridium difficile colitis [A04.72] Acute renal failure superimposed on chronic kidney disease (HCC) [N17.9, N18.9] Hypotension due to hypovolemia [E86.1] Acute renal failure superimposed on stage 3 chronic kidney disease, unspecified acute renal failure type, unspecified whether stage 3a or 3b CKD (HCC) [N17.9, N18.30] Patient Active Problem List   Diagnosis Date Noted   Acute renal failure superimposed on chronic kidney disease (HCC) 06/26/2023   Elevated PSA 05/31/2023   Nephrolithiasis 05/31/2023   Bilateral lower extremity edema 05/31/2023   Chronic atrial fibrillation (HCC) 05/31/2023   Anemia 05/31/2023   Metabolic acidosis 05/31/2023   Abnormal urinalysis 05/31/2023   Proteinuria 05/31/2023   Acute kidney injury superimposed on chronic kidney disease (HCC) 05/30/2023   PCP:  Gwenlyn Found, MD Pharmacy:   [id number] G[place] Otto, [place] -  [address] [place] [address] Phone: [phone] Fax: [phone]  [facility] [address] [place] [address] Phone: [phone] Fax: [phone]     Social Drivers of Health (SDOH) Social  History: SDOH Screenings   Food Insecurity: No Food Insecurity (06/27/2023)  Housing: Low Risk  (06/27/2023)  Transportation Needs: No Transportation Needs (06/27/2023)  Utilities: Not At Risk (06/27/2023)  Social Connections: Moderately Isolated (06/27/2023)  Tobacco Use: Low Risk  (06/27/2023)   SDOH Interventions: Food Insecurity Interventions: Intervention Not Indicated, Inpatient TOC Housing Interventions: Intervention Not Indicated, Inpatient TOC Transportation Interventions: Intervention Not Indicated, Inpatient TOC Utilities Interventions: Intervention Not Indicated, Inpatient TOC   Readmission Risk Interventions    06/27/2023    1:00 PM  Readmission Risk Prevention Plan  Transportation Screening Complete  PCP or Specialist Appt within 5-7 Days Complete  Home Care Screening Complete  Medication Review (RN CM) Complete

## 2023-06-27 NOTE — Procedures (Signed)
 I have reviewed the pt's issues and adjusted the CRRT session to match the needs of the patient.  Vinson Moselle MD  [facility] 06/27/2023, 11:10 AM

## 2023-06-27 NOTE — Plan of Care (Signed)

## 2023-06-27 NOTE — Plan of Care (Signed)

## 2023-06-27 NOTE — Progress Notes (Signed)
 Cross covering I[facility] physican   Called to bedside for time pt is on   increasing levo at this time. Concern for need for cvc. Had already called Pccm.    Upon arrival pt was found to be acidotic which is likely contributing to hypotension  His Cr cont to rise as well and may indeed require a vas catheter for cvvh.   At this time we will attempt albumin  Omcr

## 2023-06-27 NOTE — Progress Notes (Signed)
 NAME:  Calvin Schultz, MRN:  [id number], DOB:  Sep 05, 1950, LOS: 1 ADMISSION DATE:  06/26/2023, CONSULTATION DATE: 2/21 REFERRING MD: Dr. Anitra Lauth, CHIEF COMPLAINT: Renal failure, hypotension  History of Present Illness:  73 year old male with past medical history as below, which is significant for atrial fibrillation on Eliquis, hypertension, diabetes mellitus, CKD, and recently admitted for acute on chronic renal failure with left-sided hydronephrosis secondary to several calculi in the left renal collecting system with possible staghorn calculus concerning for left UPJ stricture.  Nephrostomy tube was placed on the left and the patient was discharged with the nephrostomy tube as well as ongoing antibiotics transitioned from ceftriaxone to Bactrim p.o.  Also treated for lower extremity edema during that admission with aggressive IV Lasix diuresis.  Initial post discharge course was unremarkable, but ultimately the patient began to experience worsening weakness associated with nausea, vomiting, and diarrhea as well as left lower quadrant pain.  He has some difficulty walking at baseline due to knee pain but as a result of the mentioned symptoms he became unable to walk at all due to weakness.  This is what his family called EMS and he was transported to [facility] emergency department on 2/21.  Upon arrival to the emergency department he was noted to be hypotensive and IV fluid resuscitation was initiated.  Initially blood pressure did respond to IV fluids, however, he was ultimately required to be started on norepinephrine infusion.  Workup in the ED included CT scan which was concerning for colitis of the descending and sigmoid colon.  C. difficile antigen and toxin positive.  He was started on appropriate antibiotics.  PCCM was asked to evaluate in the setting of hypotension.  Pertinent  Medical History   has a past medical history of Arthritis, CKD (chronic kidney disease) stage 3, GFR 30-59 ml/min  (HCC), Diabetes mellitus without complication (HCC), Hyperlipidemia, and Hypertension.   Significant Hospital Events: Including procedures, antibiotic start and stop dates in addition to other pertinent events   1/31-2/5 admit to [facility] for acute on chronic renal failure. Left UJP stone, hydro. Nephrostomy tube placed.  2/21 admitted to [facility] with shock, dehydration, and acute renal failure.   Interim History / Subjective:   Critically ill, on 8 mics of Levophed this morning Afebrile Complains of loose stool 475 cc urine output  Objective   Blood pressure (!) 118/48, pulse 73, temperature 98.6 F (37 C), temperature source Oral, resp. rate 10, height 5\' 9"  (1.753 m), weight 105.5 kg, SpO2 99%.        Intake/Output Summary (Last 24 hours) at 06/27/2023 1141 Last data filed at 06/27/2023 1105 Gross per 24 hour  Intake 4469.03 ml  Output 1250 ml  Net 3219.03 ml   Filed Weights   06/26/23 1017 06/26/23 2000 06/27/23 0300  Weight: 101.6 kg 105.5 kg 105.5 kg    Examination: General: Older adult male in NAD resting comfortably in bed HENT: [place]/AT, PERRL, no JVD Lungs: Bilateral clear breath sounds, no accessory muscle use Cardiovascular: S1-S2 irregular Abdomen: Soft, NT, ND. L nephrostomy tube with amber urinary output.  Extremities: 2+ edema Neuro: Alert, oriented, non-focal. Somewhat hard of hearing.   Labs show severe leukocytosis with left shift, stable hyponatremia, stable anemia, BUNs/creatinine unchanged 91/10.5, normal potassium  Resolved Hospital Problem list     Assessment & Plan:   Shock: hypovolemic vs septic - continue IV fluids at 150 an hour -Can change to quad strength Levophed as needed to maintain MAP 65 and above  AKI  superimposed on CKD stage IV, baseline creatinine 4.9 Status post left nephrostomy 1/29 by IR with plan for ureteric stent placement 3/21-has staghorn calculi on left -Discussed with renal, will initiate CRRT -Hoping for some renal recovery at  least back down to baseline -Continue bicarbonate at 100 cc/h -Stop ARB.   C. difficile colitis -He does not seem to have been on long-term antibiotics, complains of diarrhea for a year -Will treat with oral vancomycin and Flagyl , being elderly he will qualify for fidaxomicin  Chronic atrial fibrillation -on Eliquis -Switch back to Eliquis once HD catheter placed  Diabetes type 2 -may have lowered insulin requirements -SSI   Best Practice (right click and "Reselect all SmartList Selections" daily)   Diet/type: Regular consistency (see orders) DVT prophylaxis DOAC Pressure ulcer(s): N/A GI prophylaxis: N/A Lines: N/A Foley:  N/A Code Status:  full code Last date of multidisciplinary goals of care discussion [patient updated at bedside]  Labs   CBC: Recent Labs  Lab 06/26/23 1045 06/27/23 0245  WBC 29.5* 30.0*  NEUTROABS 26.1*  --   HGB 8.9* 9.7*  HCT 29.9* 32.3*  MCV 89.3 87.5  PLT 344 411*    Basic Metabolic Panel: Recent Labs  Lab 06/26/23 1045 06/26/23 1847 06/27/23 0245  NA 129* 130* 130*  K 4.3 4.2 3.8  CL 105 105 103  CO2 10* 11* 10*  GLUCOSE 95 79 86  BUN 96* 89* 91*  CREATININE 10.80* 10.49* 10.53*  CALCIUM 7.9* 7.8* 7.6*  MG  --   --  2.3  PHOS  --   --  9.8*   GFR: Estimated Creatinine Clearance: 7.8 mL/min (A) (by C-G formula based on SCr of 10.53 mg/dL (H)). Recent Labs  Lab 06/26/23 1045 06/26/23 1327 06/27/23 0245  WBC 29.5*  --  30.0*  LATICACIDVEN  --  1.2  --     Liver Function Tests: No results for input(s): "AST", "ALT", "ALKPHOS", "BILITOT", "PROT", "ALBUMIN" in the last 168 hours. No results for input(s): "LIPASE", "AMYLASE" in the last 168 hours. No results for input(s): "AMMONIA" in the last 168 hours.  ABG No results found for: "PHART", "PCO2ART", "PO2ART", "HCO3", "TCO2", "ACIDBASEDEF", "O2SAT"   Coagulation Profile: No results for input(s): "INR", "PROTIME" in the last 168 hours.  Cardiac Enzymes: No results  for input(s): "CKTOTAL", "CKMB", "CKMBINDEX", "TROPONINI" in the last 168 hours.  HbA1C: No results found for: "HGBA1C"  CBG: Recent Labs  Lab 06/26/23 1647 06/26/23 2005 06/26/23 2220 06/27/23 0754  GLUCAP 81 75 93 86   Critical care time: 32 m      Cyril Mourning MD. FCCP. [facility] Pager : [phone]  If no response to pager , please call [phone] until 7 pm After 7:00 pm call Elink  [phone]   06/27/2023

## 2023-06-27 NOTE — Procedures (Signed)
 Central Venous Catheter Insertion Procedure Note  Daequan Kozma  [id number]  Aug 06, 1950  Date:06/27/23  Time:11:38 AM   Provider Performing:Haile Toppins V. Vassie Loll   Procedure: Insertion of Non-tunneled Central Venous Catheter(36556)with US guidance (29562)    Indication(s) Hemodialysis  Consent Risks of the procedure as well as the alternatives and risks of each were explained to the patient and/or caregiver.  Consent for the procedure was obtained and is signed in the bedside chart  Anesthesia Topical only with 1% lidocaine   Timeout Verified patient identification, verified procedure, site/side was marked, verified correct patient position, special equipment/implants available, medications/allergies/relevant history reviewed, required imaging and test results available.  Sterile Technique Maximal sterile technique including full sterile barrier drape, hand hygiene, sterile gown, sterile gloves, mask, hair covering, sterile ultrasound probe cover (if used).  Procedure Description Area of catheter insertion was cleaned with chlorhexidine and draped in sterile fashion.   With real-time ultrasound guidance a HD catheter was placed into the left internal jugular vein.  Nonpulsatile blood flow and easy flushing noted in all ports.  The catheter was sutured in place and sterile dressing applied.  Complications/Tolerance None; patient tolerated the procedure well. Chest X-ray is ordered to verify placement for internal jugular or subclavian cannulation.  Chest x-ray is not ordered for femoral cannulation.  EBL Minimal  Specimen(s) None  Majesty Oehlert V. Vassie Loll MD

## 2023-06-27 NOTE — Progress Notes (Signed)
 Patients IV watch which was placed for levo on Peripheral iv  began to red alarm. When checking the line patient began complaining about burning pain.line gave good blood return and flushed fine. IV site was red and warm to the touch. CCM was notified and orders were placed for nitroglycerin and regitine. Nitroglycerin was applied to site and border was marked with skin marker. Pharmacy was also consulted. Was instructed that no further interventions needed at this time.  Calvin Schultz

## 2023-06-27 NOTE — Progress Notes (Signed)
 Pharmacy Brief Note - CRRT Medication Dose Review:  Patient has history of CKD, baseline SCr 4.5-5.3. Admitted with shock, SCr 10.5. Nephrology consulted, CRRT initiated. Pharmacy consulted to review medications for CRRT dose adjustments.   PO vancomycin for C.diff not systemically absorbed, no dose adjustment needed CCM transitioning anticoagulation from UFH back to apixaban (Pt taking PTA for afib). Discussed with Dr Vassie Loll who ordered 2.5 mg PO BID.   Cindi Carbon, PharmD 06/27/23 2:32 PM

## 2023-06-27 NOTE — Progress Notes (Signed)
 PHARMACY - ANTICOAGULATION CONSULT NOTE  Pharmacy Consult for heparin Indication: atrial fibrillation  No Known Allergies  Patient Measurements: Height: 5\' 9"  (175.3 cm) Weight: 105.5 kg (232 lb 9.4 oz) IBW/kg (Calculated) : 70.7 Heparin Dosing Weight: 92 kg  Vital Signs: Temp: 98 F (36.7 C) (02/22 0300) Temp Source: Oral (02/22 0300) BP: 120/45 (02/22 0400) Pulse Rate: 85 (02/22 0400)  Labs: Recent Labs    06/26/23 1045 06/26/23 1847 06/27/23 0245  HGB 8.9*  --  9.7*  HCT 29.9*  --  32.3*  PLT 344  --  411*  APTT  --   --  58*  CREATININE 10.80* 10.49*  --     Estimated Creatinine Clearance: 7.8 mL/min (A) (by C-G formula based on SCr of 10.49 mg/dL (H)).   Medical History: Past Medical History:  Diagnosis Date   Arthritis    CKD (chronic kidney disease) stage 3, GFR 30-59 ml/min (HCC)    Diabetes mellitus without complication (HCC)    Hyperlipidemia    Hypertension      Assessment: 73 year old male on Eliquis for afib. Patient recently admitted with left-sided hydronephrosis secondary to several calculi, nephrostomy tubed placed. Patient began to experience worsening weakness associated with N/V/D. Patient presented to ED, was hypotensive. Imaging concerning for colitis, C.diff antigen and toxin positive. Found to have AKI on CKD. Eliquis on hold - pharmacy consulted to manage heparin infusion.  Last dose of Eliquis reported 2/20 at 2000.  06/27/2023 aPTT 58 subtherapeutic on 1300 units/hr Hgb 9.7, Plts 411 Per RN has blood in his nephrostomy drain at baseline but nothing has increased since admission   Goal of Therapy:  Heparin level 0.3-0.7 units/ml aPTT 66-102 Monitor platelets by anticoagulation protocol: Yes   Plan:  -increase heparin drip to 1500 units/hr -Check 8 hour aPTT - will follow aPTT until correlation with heparin level -Daily CBC and heparin level -Monitor for signs/symptoms of bleeding  Arley Phenix RPh 06/27/2023, 4:09  AM

## 2023-06-28 DIAGNOSIS — R6521 Severe sepsis with septic shock: Secondary | ICD-10-CM

## 2023-06-28 DIAGNOSIS — A419 Sepsis, unspecified organism: Secondary | ICD-10-CM

## 2023-06-28 DIAGNOSIS — N170 Acute kidney failure with tubular necrosis: Secondary | ICD-10-CM | POA: Diagnosis not present

## 2023-06-28 DIAGNOSIS — N183 Chronic kidney disease, stage 3 unspecified: Secondary | ICD-10-CM | POA: Diagnosis not present

## 2023-06-28 LAB — GLUCOSE, CAPILLARY
Glucose-Capillary: 112 mg/dL — ABNORMAL HIGH (ref 70–99)
Glucose-Capillary: 113 mg/dL — ABNORMAL HIGH (ref 70–99)
Glucose-Capillary: 141 mg/dL — ABNORMAL HIGH (ref 70–99)
Glucose-Capillary: 162 mg/dL — ABNORMAL HIGH (ref 70–99)
Glucose-Capillary: 181 mg/dL — ABNORMAL HIGH (ref 70–99)

## 2023-06-28 LAB — RENAL FUNCTION PANEL
Albumin: 2.1 g/dL — ABNORMAL LOW (ref 3.5–5.0)
Albumin: 1.9 g/dL — ABNORMAL LOW (ref 3.5–5.0)
Anion gap: 9 (ref 5–15)
Anion gap: 10 (ref 5–15)
BUN: 46 mg/dL — ABNORMAL HIGH (ref 8–23)
BUN: 31 mg/dL — ABNORMAL HIGH (ref 8–23)
CO2: 29 mmol/L (ref 22–32)
CO2: 26 mmol/L (ref 22–32)
Calcium: 7.1 mg/dL — ABNORMAL LOW (ref 8.9–10.3)
Calcium: 7.2 mg/dL — ABNORMAL LOW (ref 8.9–10.3)
Chloride: 93 mmol/L — ABNORMAL LOW (ref 98–111)
Chloride: 96 mmol/L — ABNORMAL LOW (ref 98–111)
Creatinine, Ser: 4.78 mg/dL — ABNORMAL HIGH (ref 0.61–1.24)
Creatinine, Ser: 3.52 mg/dL — ABNORMAL HIGH (ref 0.61–1.24)
GFR, Estimated: 12 mL/min — ABNORMAL LOW (ref >60)
GFR, Estimated: 18 mL/min — ABNORMAL LOW (ref >60)
Glucose, Bld: 120 mg/dL — ABNORMAL HIGH (ref 70–99)
Glucose, Bld: 155 mg/dL — ABNORMAL HIGH (ref 70–99)
Phosphorus: 4.2 mg/dL (ref 2.5–4.6)
Phosphorus: 3.7 mg/dL (ref 2.5–4.6)
Potassium: 3.2 mmol/L — ABNORMAL LOW (ref 3.5–5.1)
Potassium: 3.5 mmol/L (ref 3.5–5.1)
Sodium: 131 mmol/L — ABNORMAL LOW (ref 135–145)
Sodium: 132 mmol/L — ABNORMAL LOW (ref 135–145)

## 2023-06-28 LAB — CBC
HCT: 27 % — ABNORMAL LOW (ref 39.0–52.0)
Hemoglobin: 8.6 g/dL — ABNORMAL LOW (ref 13.0–17.0)
MCH: 26.9 pg (ref 26.0–34.0)
MCHC: 31.9 g/dL (ref 30.0–36.0)
MCV: 84.4 fL (ref 80.0–100.0)
Platelets: 321 10*3/uL (ref 150–400)
RBC: 3.2 MIL/uL — ABNORMAL LOW (ref 4.22–5.81)
RDW: 14.5 % (ref 11.5–15.5)
WBC: 22.7 10*3/uL — ABNORMAL HIGH (ref 4.0–10.5)
nRBC: 0 % (ref 0.0–0.2)

## 2023-06-28 LAB — MAGNESIUM: Magnesium: 2 mg/dL (ref 1.7–2.4)

## 2023-06-28 LAB — APTT: aPTT: 43 s — ABNORMAL HIGH (ref 24–36)

## 2023-06-28 MED ORDER — TIZANIDINE HCL 4 MG PO TABS
4.0000 mg | ORAL_TABLET | Freq: Two times a day (BID) | ORAL | Status: DC | PRN
  Administered 2023-06-28 – 2023-07-02 (×6): 4 mg via ORAL
  Filled 2023-06-28 (×6): qty 1

## 2023-06-28 MED ORDER — MELATONIN 3 MG PO TABS
3.0000 mg | ORAL_TABLET | Freq: Once | ORAL | Status: AC
  Administered 2023-06-28: 3 mg via ORAL
  Filled 2023-06-28: qty 1

## 2023-06-28 MED ORDER — ALUM & MAG HYDROXIDE-SIMETH 200-200-20 MG/5ML PO SUSP
30.0000 mL | ORAL | Status: DC | PRN
  Administered 2023-06-28: 30 mL via ORAL
  Filled 2023-06-28: qty 30

## 2023-06-28 MED ORDER — ONDANSETRON HCL 4 MG/2ML IJ SOLN
4.0000 mg | Freq: Three times a day (TID) | INTRAMUSCULAR | Status: DC | PRN
  Administered 2023-06-28 – 2023-07-08 (×8): 4 mg via INTRAVENOUS
  Filled 2023-06-28 (×9): qty 2

## 2023-06-28 MED ORDER — ACETAMINOPHEN 325 MG PO TABS
650.0000 mg | ORAL_TABLET | ORAL | Status: DC | PRN
  Administered 2023-07-10: 650 mg via ORAL
  Filled 2023-06-28: qty 2

## 2023-06-28 NOTE — Plan of Care (Signed)

## 2023-06-28 NOTE — Progress Notes (Signed)
 eLink Physician-Brief Progress Note Patient Name: Calvin Schultz DOB: [birth year] MRN: [id number]   Date of Service  06/28/2023  HPI/Events of Note  Not sleeping well.   eICU Interventions  Melatonin ordered for insomnia.      Intervention Category Minor Interventions: Other:  Ranee Gosselin 06/28/2023, 8:10 PM  01:19 Requesting for oxy 5mg  q6 PRN PO to be reordered. Looks like the med was completed. Patient c/o 9/10 abd pain.( Chronic). Nephrostomy tube, cl difficle.  AKI. On eliquis. Still on levo. On room air. His mental status is fine per RN discussion.  - ordered once oxycodone for tonight, AM team to re assess pain and order narcotics.

## 2023-06-28 NOTE — Progress Notes (Signed)
 NAME:  Calvin Schultz, MRN:  [id number], DOB:  Sep 02, 1950, LOS: 2 ADMISSION DATE:  06/26/2023, CONSULTATION DATE: 2/21 REFERRING MD: Dr. Anitra Lauth, CHIEF COMPLAINT: Renal failure, hypotension  History of Present Illness:  73 year old male with past medical history as below, which is significant for atrial fibrillation on Eliquis, hypertension, diabetes mellitus, CKD, and recently admitted for acute on chronic renal failure with left-sided hydronephrosis secondary to several calculi in the left renal collecting system with possible staghorn calculus concerning for left UPJ stricture.  Nephrostomy tube was placed on the left and the patient was discharged with the nephrostomy tube as well as ongoing antibiotics transitioned from ceftriaxone to Bactrim p.o.  Also treated for lower extremity edema during that admission with aggressive IV Lasix diuresis.  Initial post discharge course was unremarkable, but ultimately the patient began to experience worsening weakness associated with nausea, vomiting, and diarrhea as well as left lower quadrant pain.  He has some difficulty walking at baseline due to knee pain but as a result of the mentioned symptoms he became unable to walk at all due to weakness.  This is what his family called EMS and he was transported to [facility] emergency department on 2/21.  Upon arrival to the emergency department he was noted to be hypotensive and IV fluid resuscitation was initiated.  Initially blood pressure did respond to IV fluids, however, he was ultimately required to be started on norepinephrine infusion.  Workup in the ED included CT scan which was concerning for colitis of the descending and sigmoid colon.  C. difficile antigen and toxin positive.  He was started on appropriate antibiotics.  PCCM was asked to evaluate in the setting of hypotension.  Pertinent  Medical History   has a past medical history of Arthritis, CKD (chronic kidney disease) stage 3, GFR 30-59 ml/min  (HCC), Diabetes mellitus without complication (HCC), Hyperlipidemia, and Hypertension.   Significant Hospital Events: Including procedures, antibiotic start and stop dates in addition to other pertinent events   1/31-2/5 admit to [facility] for acute on chronic renal failure. Left UJP stone, hydro. Nephrostomy tube placed.  2/21 admitted to [facility] with shock, dehydration, and acute renal failure.  2/22 started CRRT  Interim History / Subjective:   Critically ill, remains on 10 mics of Levophed Tolerating CRRT -developed cramps, did not sleep well overnight Afebrile  Objective   Blood pressure (!) 127/48, pulse (!) 58, temperature 98.5 F (36.9 C), temperature source Oral, resp. rate 14, height 5\' 9"  (1.753 m), weight 108 kg, SpO2 96%.        Intake/Output Summary (Last 24 hours) at 06/28/2023 1133 Last data filed at 06/28/2023 1100 Gross per 24 hour  Intake 5072.49 ml  Output 5040 ml  Net 32.49 ml   Filed Weights   06/26/23 2000 06/27/23 0300 06/28/23 0500  Weight: 105.5 kg 105.5 kg 108 kg    Examination: General: Older adult male in NAD sitting up in bed HENT: [place]/AT, PERRL, no JVD Lungs: Bilateral clear breath sounds, no accessory muscle use Cardiovascular: S1-S2 irregular Abdomen: Soft, NT, ND. L nephrostomy tube with amber urinary output.  Extremities: 2+ edema Neuro: Alert, interactive, nonfocal, hard of hearing  Labs show decreasing leukocytosis, slight drop in hemoglobin, hypokalemia, decreased BUN and creatinine, albumin 2.1  Resolved Hospital Problem list     Assessment & Plan:   Septic shock -  Levophed as needed to maintain MAP 65 and above  AKI superimposed on CKD stage IV, baseline creatinine 4.9 Status post left nephrostomy  1/29 by IR with plan for ureteric stent placement 3/21-has staghorn calculi on left -Continue CRRT -Hoping for some renal recovery at least back down to baseline -Can DC bicarbonate -Stop ARB. -Zanaflex as needed for leg cramps  C.  difficile colitis -He does not seem to have been on long-term antibiotics, complains of diarrhea for a year -Will treat with oral vancomycin and Flagyl , being elderly he will qualify for fidaxomicin but holding off since clinically better  Chronic atrial fibrillation -on Eliquis -Switch back to Eliquis   Diabetes type 2 -may have lowered insulin requirements -SSI   Best Practice (right click and "Reselect all SmartList Selections" daily)   Diet/type: Regular consistency (see orders) DVT prophylaxis DOAC Pressure ulcer(s): N/A GI prophylaxis: N/A Lines: N/A Foley:  N/A Code Status:  full code Last date of multidisciplinary goals of care discussion [patient and spouse updated at bedside]  Labs   CBC: Recent Labs  Lab 06/26/23 1045 06/27/23 0245 06/28/23 0544  WBC 29.5* 30.0* 22.7*  NEUTROABS 26.1*  --   --   HGB 8.9* 9.7* 8.6*  HCT 29.9* 32.3* 27.0*  MCV 89.3 87.5 84.4  PLT 344 411* 321    Basic Metabolic Panel: Recent Labs  Lab 06/26/23 1045 06/26/23 1847 06/27/23 0245 06/27/23 1604 06/28/23 0544  NA 129* 130* 130* 127* 131*  K 4.3 4.2 3.8 2.9* 3.2*  CL 105 105 103 85* 93*  CO2 10* 11* 10* 31 29  GLUCOSE 95 79 86 154* 120*  BUN 96* 89* 91* 70* 46*  CREATININE 10.80* 10.49* 10.53* 7.57* 4.78*  CALCIUM 7.9* 7.8* 7.6* 5.9* 7.1*  MG  --   --  2.3  --  2.0  PHOS  --   --  9.8* 6.8* 4.2   GFR: Estimated Creatinine Clearance: 17.4 mL/min (A) (by C-G formula based on SCr of 4.78 mg/dL (H)). Recent Labs  Lab 06/26/23 1045 06/26/23 1327 06/27/23 0245 06/28/23 0544  WBC 29.5*  --  30.0* 22.7*  LATICACIDVEN  --  1.2  --   --     Liver Function Tests: Recent Labs  Lab 06/27/23 1604 06/28/23 0544  ALBUMIN 1.9* 2.1*   No results for input(s): "LIPASE", "AMYLASE" in the last 168 hours. No results for input(s): "AMMONIA" in the last 168 hours.  ABG No results found for: "PHART", "PCO2ART", "PO2ART", "HCO3", "TCO2", "ACIDBASEDEF", "O2SAT"   Coagulation  Profile: No results for input(s): "INR", "PROTIME" in the last 168 hours.  Cardiac Enzymes: No results for input(s): "CKTOTAL", "CKMB", "CKMBINDEX", "TROPONINI" in the last 168 hours.  HbA1C: Hgb A1c MFr Bld  Date/Time Value Ref Range Status  06/26/2023 04:48 PM 6.2 (H) 4.8 - 5.6 % Final    Comment:    (NOTE) Pre diabetes:          5.7%-6.4%  Diabetes:              >6.4%  Glycemic control for   <7.0% adults with diabetes     CBG: Recent Labs  Lab 06/27/23 0754 06/27/23 1206 06/27/23 1643 06/28/23 0005 06/28/23 0814  GLUCAP 86 96 169* 113* 112*   Critical care time: 31 m      Cyril Mourning MD. FCCP. [facility] Pager : [phone]  If no response to pager , please call [phone] until 7 pm After 7:00 pm call Elink  [phone]   06/28/2023

## 2023-06-28 NOTE — Progress Notes (Signed)
 [facility] Progress Note  Subjective:  UOP 1510 yest and 1735 so far today Total I/O = +3.1 L  Pt has no c/o's, didn't sleep well last night Pressors - levo gtt at 8 mcg /min    Vitals:   06/28/23 1830 06/28/23 1845 06/28/23 1859 06/28/23 1900  BP: 121/63 (!) 94/44 (!) 119/49 (!) 119/49  Pulse: 82 71 93 89  Resp: 13 13 11 12   Temp:   98.3 F (36.8 C)   TempSrc:   Oral   SpO2: 98% 98% 98% 98%  Weight:      Height:        Exam: Gen alert, no distress, 98-100% on RA No rash, cyanosis or gangrene Sclera anicteric, throat clear  +jvd at 40 deg bilat  Chest R clear, L +rales improved w/ deep breathing RRR no MRG Abd soft ntnd no mass or ascites +bs GU nl male MS no joint effusions or deformity Ext diffuse 2+ pretib edema bilat LE's, no other edema Neuro is alert, Ox 3 , nf, no asterixis, looks a bit tired       Renal-related home meds: - norvasc 5 - olmesartan 20 every day - fosrenol 750 mg ac tid - renvela 800 ac tid - others: insulin, eliquis       Date                             Creat               eGFR (ml/min) 04/18/2021                  1.87                 39 06/25/21                        2.00                 36 ml/min 07/2021                         1.73                 42 09/2021                         2.17                 32 12/2021                         2.51                 27 01/2022                         2.48                 28 ml/min, CKD 4 05/30/23- 06/05/23 4.43- 5.34 11- 14 ml/min 06/26/23                        10.8                 5 ml/min   UA - prot > 300, mod Hb, large LE, 11-20 rbcs > 50 wbc, 0-5 epi UNa 114, UCr 20 CT renal stone 2/21 -  Adrenals/Urinary Tract: Adrenal glands appear normal. Bilateral nephrolithiasis is noted. Percutaneous left nephrostomy is noted. No hydronephrosis or renal obstruction is noted. Urinary bladder is unremarkable.  CXR 2/21 - IMPRESSION: 1. No acute intrathoracic process.       Assessment/  Plan: AKI on CKD 4 - b/l creat 4.5- 5.3 from jan 2025, eGFR 11-14 ml/min. Creat here was 10.8 in the setting of hypotension, new dx of Cdif (1 wk diarrhea, n/v), swollen legs and SOB. Has rec'd about 3 L bolus fluids in ED and did not look SOB, however due to his leg swelling no more boluses were given and they started levophed to treat the hypotension. UA wbc > rbc, prot > 300. CT w/o obstruction, has L neph tube. Suspect AKI is hemodynamic related to hypotension +/- ARB +/- intravasc vol depletion. ATN is possibility, pt is not anuric though and making good urine. Creat did not improve and due to sig azotemia and metabolic acidosis, CRRT was started on 2/22. Cont CRRT.  Volume - wts are up and I/O are + 3 L. Change to UF 50 cc/hr net negative.  Metabolic acidosis - serum CO2 26, much better. DC IV bicarb. Shock/ hypotension - possibly related to colitis/ Cdif. IV abx per CCM.  L nephrostomy tube - placed in Jan 2025 by IR, and is f/b urology. Has staghorn calculi on the L and is supposed to have "surgery" (per the wife) on March 7th at [facility].  DM 2- on insulin Chronic atrial fib - on eliquis Bilat LE edema - from venous insufficiency vs renal failure vs heart failure. Is on RA, CXR negative from 2/21.        Vinson Moselle MD  [facility] 06/28/2023, 8:07 PM  Recent Labs  Lab 06/27/23 0245 06/27/23 1604 06/28/23 0544 06/28/23 1633  HGB 9.7*  --  8.6*  --   ALBUMIN  --    < > 2.1* 1.9*  CALCIUM 7.6*   < > 7.1* 7.2*  PHOS 9.8*   < > 4.2 3.7  CREATININE 10.53*   < > 4.78* 3.52*  K 3.8   < > 3.2* 3.5   < > = values in this interval not displayed.   No results for input(s): "IRON", "TIBC", "FERRITIN" in the last 168 hours. Inpatient medications:  apixaban  2.5 mg Oral BID   Chlorhexidine Gluconate Cloth  6 each Topical Daily   insulin aspart  0-9 Units Subcutaneous TID WC   nitroGLYCERIN  1 inch Topical Q8H   vancomycin  500 mg Oral Q6H    heparin 10,000 units/ 20 mL infusion syringe  500 Units/hr (06/28/23 0659)   metronidazole Stopped (06/28/23 1518)   norepinephrine (LEVOPHED) Adult infusion 6 mcg/min (06/28/23 2002)   prismasol BGK 4/2.5 1,500 mL/hr at 06/28/23 1930   prismasol BGK 4/2.5 400 mL/hr at 06/28/23 1437   prismasol BGK 4/2.5 400 mL/hr at 06/28/23 1436   sodium bicarbonate 150 mEq in sterile water 1,150 mL infusion Stopped (06/28/23 1554)   acetaminophen, alum & mag hydroxide-simeth, heparin, ondansetron (ZOFRAN) IV, mouth rinse, tiZANidine

## 2023-06-29 DIAGNOSIS — E876 Hypokalemia: Secondary | ICD-10-CM

## 2023-06-29 DIAGNOSIS — A419 Sepsis, unspecified organism: Secondary | ICD-10-CM | POA: Diagnosis not present

## 2023-06-29 DIAGNOSIS — A0472 Enterocolitis due to Clostridium difficile, not specified as recurrent: Secondary | ICD-10-CM | POA: Diagnosis not present

## 2023-06-29 DIAGNOSIS — N184 Chronic kidney disease, stage 4 (severe): Secondary | ICD-10-CM

## 2023-06-29 DIAGNOSIS — N170 Acute kidney failure with tubular necrosis: Secondary | ICD-10-CM | POA: Diagnosis not present

## 2023-06-29 LAB — GLUCOSE, CAPILLARY
Glucose-Capillary: 135 mg/dL — ABNORMAL HIGH (ref 70–99)
Glucose-Capillary: 149 mg/dL — ABNORMAL HIGH (ref 70–99)
Glucose-Capillary: 161 mg/dL — ABNORMAL HIGH (ref 70–99)
Glucose-Capillary: 161 mg/dL — ABNORMAL HIGH (ref 70–99)

## 2023-06-29 LAB — CBC
HCT: 28.7 % — ABNORMAL LOW (ref 39.0–52.0)
Hemoglobin: 8.8 g/dL — ABNORMAL LOW (ref 13.0–17.0)
MCH: 26.9 pg (ref 26.0–34.0)
MCHC: 30.7 g/dL (ref 30.0–36.0)
MCV: 87.8 fL (ref 80.0–100.0)
Platelets: 288 10*3/uL (ref 150–400)
RBC: 3.27 MIL/uL — ABNORMAL LOW (ref 4.22–5.81)
RDW: 14.6 % (ref 11.5–15.5)
WBC: 23.1 10*3/uL — ABNORMAL HIGH (ref 4.0–10.5)
nRBC: 0 % (ref 0.0–0.2)

## 2023-06-29 LAB — RENAL FUNCTION PANEL
Albumin: 2 g/dL — ABNORMAL LOW (ref 3.5–5.0)
Albumin: 2 g/dL — ABNORMAL LOW (ref 3.5–5.0)
Anion gap: 8 (ref 5–15)
Anion gap: 5 (ref 5–15)
BUN: 23 mg/dL (ref 8–23)
BUN: 17 mg/dL (ref 8–23)
CO2: 26 mmol/L (ref 22–32)
CO2: 27 mmol/L (ref 22–32)
Calcium: 7.6 mg/dL — ABNORMAL LOW (ref 8.9–10.3)
Calcium: 7.4 mg/dL — ABNORMAL LOW (ref 8.9–10.3)
Chloride: 98 mmol/L (ref 98–111)
Chloride: 100 mmol/L (ref 98–111)
Creatinine, Ser: 2.8 mg/dL — ABNORMAL HIGH (ref 0.61–1.24)
Creatinine, Ser: 2.18 mg/dL — ABNORMAL HIGH (ref 0.61–1.24)
GFR, Estimated: 24 mL/min — ABNORMAL LOW (ref >60)
GFR, Estimated: 32 mL/min — ABNORMAL LOW (ref >60)
Glucose, Bld: 148 mg/dL — ABNORMAL HIGH (ref 70–99)
Glucose, Bld: 182 mg/dL — ABNORMAL HIGH (ref 70–99)
Phosphorus: 2.7 mg/dL (ref 2.5–4.6)
Phosphorus: 2.4 mg/dL — ABNORMAL LOW (ref 2.5–4.6)
Potassium: 3.4 mmol/L — ABNORMAL LOW (ref 3.5–5.1)
Potassium: 4 mmol/L (ref 3.5–5.1)
Sodium: 132 mmol/L — ABNORMAL LOW (ref 135–145)
Sodium: 132 mmol/L — ABNORMAL LOW (ref 135–145)

## 2023-06-29 LAB — MAGNESIUM: Magnesium: 2.3 mg/dL (ref 1.7–2.4)

## 2023-06-29 LAB — LACTIC ACID, PLASMA: Lactic Acid, Venous: 1.2 mmol/L (ref 0.5–1.9)

## 2023-06-29 LAB — APTT: aPTT: 44 s — ABNORMAL HIGH (ref 24–36)

## 2023-06-29 MED ORDER — MORPHINE SULFATE (PF) 2 MG/ML IV SOLN
1.0000 mg | INTRAVENOUS | Status: DC | PRN
  Administered 2023-06-29: 1 mg via INTRAVENOUS
  Filled 2023-06-29: qty 1

## 2023-06-29 MED ORDER — OXYCODONE HCL 5 MG PO TABS
5.0000 mg | ORAL_TABLET | ORAL | Status: DC | PRN
  Administered 2023-06-29 – 2023-07-02 (×4): 5 mg via ORAL
  Administered 2023-07-02 – 2023-07-09 (×7): 10 mg via ORAL
  Administered 2023-07-09: 5 mg via ORAL
  Administered 2023-07-10: 10 mg via ORAL
  Filled 2023-06-29 (×6): qty 2
  Filled 2023-06-29 (×2): qty 1
  Filled 2023-06-29 (×5): qty 2
  Filled 2023-06-29: qty 1
  Filled 2023-06-29: qty 2

## 2023-06-29 MED ORDER — SODIUM PHOSPHATES 45 MMOLE/15ML IV SOLN
15.0000 mmol | Freq: Once | INTRAVENOUS | Status: AC
  Administered 2023-06-29: 15 mmol via INTRAVENOUS
  Filled 2023-06-29: qty 5

## 2023-06-29 MED ORDER — FENTANYL CITRATE PF 50 MCG/ML IJ SOSY
50.0000 ug | PREFILLED_SYRINGE | INTRAMUSCULAR | Status: DC | PRN
  Administered 2023-06-29: 50 ug via INTRAVENOUS
  Filled 2023-06-29: qty 1

## 2023-06-29 MED ORDER — RENA-VITE PO TABS
1.0000 | ORAL_TABLET | Freq: Every day | ORAL | Status: DC
  Administered 2023-06-29 – 2023-07-09 (×11): 1 via ORAL
  Filled 2023-06-29 (×12): qty 1

## 2023-06-29 MED ORDER — OXYCODONE-ACETAMINOPHEN 5-325 MG PO TABS
1.0000 | ORAL_TABLET | Freq: Once | ORAL | Status: AC | PRN
  Administered 2023-06-29: 1 via ORAL
  Filled 2023-06-29: qty 1

## 2023-06-29 MED ORDER — TRAZODONE HCL 50 MG PO TABS
50.0000 mg | ORAL_TABLET | Freq: Every evening | ORAL | Status: DC | PRN
  Administered 2023-06-29 – 2023-07-05 (×5): 50 mg via ORAL
  Filled 2023-06-29 (×5): qty 1

## 2023-06-29 MED ORDER — ALBUMIN HUMAN 25 % IV SOLN
25.0000 g | Freq: Once | INTRAVENOUS | Status: AC
  Administered 2023-06-29: 25 g via INTRAVENOUS
  Filled 2023-06-29: qty 100

## 2023-06-29 MED ORDER — NOREPINEPHRINE 16 MG/250ML-% IV SOLN
0.0000 ug/min | INTRAVENOUS | Status: DC
  Administered 2023-06-29: 7 ug/min via INTRAVENOUS
  Filled 2023-06-29: qty 250

## 2023-06-29 MED ORDER — MIDODRINE HCL 5 MG PO TABS
10.0000 mg | ORAL_TABLET | Freq: Three times a day (TID) | ORAL | Status: DC
  Administered 2023-06-30 – 2023-07-10 (×29): 10 mg via ORAL
  Filled 2023-06-29 (×31): qty 2

## 2023-06-29 MED ORDER — HYDROMORPHONE HCL 1 MG/ML IJ SOLN
1.0000 mg | INTRAMUSCULAR | Status: DC | PRN
  Administered 2023-06-29 – 2023-07-10 (×29): 1 mg via INTRAVENOUS
  Filled 2023-06-29 (×31): qty 1

## 2023-06-29 MED ORDER — POTASSIUM CHLORIDE CRYS ER 20 MEQ PO TBCR
40.0000 meq | EXTENDED_RELEASE_TABLET | Freq: Once | ORAL | Status: AC
  Administered 2023-06-29: 40 meq via ORAL
  Filled 2023-06-29: qty 2

## 2023-06-29 MED ORDER — ENSURE ENLIVE PO LIQD
237.0000 mL | Freq: Two times a day (BID) | ORAL | Status: DC
  Administered 2023-06-29 – 2023-07-03 (×5): 237 mL via ORAL

## 2023-06-29 NOTE — Progress Notes (Signed)
 NAME:  Calvin Schultz, MRN:  [id number], DOB:  1950/10/04, LOS: 3 ADMISSION DATE:  06/26/2023, CONSULTATION DATE: 2/21 REFERRING MD: Dr. Anitra Lauth, CHIEF COMPLAINT: Renal failure, hypotension  History of Present Illness:  73 year old male with past medical history as below, which is significant for atrial fibrillation on Eliquis, hypertension, diabetes mellitus, CKD, and recently admitted for acute on chronic renal failure with left-sided hydronephrosis secondary to several calculi in the left renal collecting system with possible staghorn calculus concerning for left UPJ stricture.  Nephrostomy tube was placed on the left and the patient was discharged with the nephrostomy tube as well as ongoing antibiotics transitioned from ceftriaxone to Bactrim p.o.  Also treated for lower extremity edema during that admission with aggressive IV Lasix diuresis.  Initial post discharge course was unremarkable, but ultimately the patient began to experience worsening weakness associated with nausea, vomiting, and diarrhea as well as left lower quadrant pain.  He has some difficulty walking at baseline due to knee pain but as a result of the mentioned symptoms he became unable to walk at all due to weakness.  This is what his family called EMS and he was transported to [facility] emergency department on 2/21.  Upon arrival to the emergency department he was noted to be hypotensive and IV fluid resuscitation was initiated.  Initially blood pressure did respond to IV fluids, however, he was ultimately required to be started on norepinephrine infusion.  Workup in the ED included CT scan which was concerning for colitis of the descending and sigmoid colon.  C. difficile antigen and toxin positive.  He was started on appropriate antibiotics.  PCCM was asked to evaluate in the setting of hypotension.  Pertinent  Medical History   has a past medical history of Arthritis, CKD (chronic kidney disease) stage 3, GFR 30-59 ml/min  (HCC), Diabetes mellitus without complication (HCC), Hyperlipidemia, and Hypertension.  Significant Hospital Events: Including procedures, antibiotic start and stop dates in addition to other pertinent events   1/31-2/5 admit to [facility] for acute on chronic renal failure. Left UJP stone, hydro. Nephrostomy tube placed.  2/21 admitted to [facility] with shock, dehydration, and acute renal failure.  2/22 started CRRT 2/23 Critically ill, remains on 10 mics of Levophed. Tolerating CRRT  2/24 Remains on CRRT with ongoing ABD pain  Interim History / Subjective:  Seen sitting up in bed with ongoing complaints of ABD pain, also states he isn's sleeping well   Objective   Blood pressure (!) 113/57, pulse 84, temperature 98 F (36.7 C), temperature source Oral, resp. rate (!) 22, height 5\' 9"  (1.753 m), weight 108.2 kg, SpO2 (!) 88%.        Intake/Output Summary (Last 24 hours) at 06/29/2023 2130 Last data filed at 06/29/2023 0700 Gross per 24 hour  Intake 3085.09 ml  Output 3849.2 ml  Net -764.11 ml   Filed Weights   06/27/23 0300 06/28/23 0500 06/29/23 0500  Weight: 105.5 kg 108 kg 108.2 kg    Examination: General: Acute on chronically ill appearing elderly male lying in bed on CRRT and mechanical ventilation, in NAD HEENT: ETT, MM pink/moist, PERRL,  Neuro: Alert and oriented x3, non-focal  CV: s1s2 regular rate and rhythm, no murmur, rubs, or gallops,  PULM:  Clear to auscultation, no increased work of breathing, no added breath sounds  GI: soft, bowel sounds active in all 4 quadrants, non-tender, non-distended, tolerating oral diet  Extremities: warm/dry, no edema  Skin: no rashes or lesions  Resolved Hospital Problem  list     Assessment & Plan:   Septic shock -in the setting of C-diff colitis, blood cultures remain negative x3 days  P: Continue pressor for MAP goal > 65 Volume removal per CRRT  Monitor urine output  AKI superimposed on CKD stage IV -baseline creatinine  4.9 Status post left nephrostomy  -Nephrostomy tube placed 1/29 by IR with plan for ureteric stent placement 3/21-has staghorn calculi on left P: Nephrology following appreciate assistance  CRRT per Nephrology  Follow renal function  Monitor for signs of renal recovery  Trend Bmet Avoid nephrotoxins Ensure adequate renal perfusion  Encourage enteral hydration  Stop Bicarb drip  Zanaflex for leg cramps   C. difficile colitis -He does not seem to have been on long-term antibiotics, complains of diarrhea for a year P: Continue oral vancomycin and IV Flagyl  Monitor stool output   Chronic atrial fibrillation - On Eliquis at baseline  P: Continue home Eliquis   Diabetes type 2  -may have lowered insulin requirements P: SSI  CBG goal 140-180 CBG ACHS  Hypokalemia  P: Supplement    Best Practice (right click and "Reselect all SmartList Selections" daily)   Diet/type: Regular consistency (see orders) DVT prophylaxis DOAC Pressure ulcer(s): N/A GI prophylaxis: N/A Lines: N/A Foley:  N/A Code Status:  full code Last date of multidisciplinary goals of care discussion [patient and spouse updated at bedside]  Critical care time:   CRITICAL CARE Performed by: Brandilynn Taormina D. Harris   Total critical care time: 38 minutes  Critical care time was exclusive of separately billable procedures and treating other patients.  Critical care was necessary to treat or prevent imminent or life-threatening deterioration.  Critical care was time spent personally by me on the following activities: development of treatment plan with patient and/or surrogate as well as nursing, discussions with consultants, evaluation of patient's response to treatment, examination of patient, obtaining history from patient or surrogate, ordering and performing treatments and interventions, ordering and review of laboratory studies, ordering and review of radiographic studies, pulse oximetry and re-evaluation  of patient's condition.  Ellis Mehaffey D. Harris, NP-C [facility] Personal contact information can be found on Amion  If no contact or response made please call [phone] 06/29/2023, 7:24 AM

## 2023-06-29 NOTE — Evaluation (Signed)
 Clinical/Bedside Swallow Evaluation Patient Details  Name: Calvin Schultz MRN: [id number] Date of Birth: 1950/12/11  Today's Date: 06/29/2023 Time: SLP Start Time (ACUTE ONLY): 1003 SLP Stop Time (ACUTE ONLY): 1033 SLP Time Calculation (min) (ACUTE ONLY): 30 min  Past Medical History:  Past Medical History:  Diagnosis Date   Arthritis    CKD (chronic kidney disease) stage 3, GFR 30-59 ml/min (HCC)    Diabetes mellitus without complication (HCC)    Hyperlipidemia    Hypertension    Past Surgical History:  Past Surgical History:  Procedure Laterality Date   IR NEPHROSTOMY PLACEMENT LEFT  06/03/2023   KIDNEY SURGERY     HPI:  73yo male admitted 06/26/23 with renal failure and hypotension. PMH: AFib, HTN, HLD, DM, CKD3, recent admit for acute on chronic renal failure (1/31-06/10/23), left hydronephrosis.    Assessment / Plan / Recommendation  Clinical Impression  Pt presents with functional oropharyngeal swallow, as best can be determined without instrumental study. Suspect primary esophageal dysphagia.   CN exam WFL. Voice quality is hoarse. Pt reports this is due to vomiting last week. Pt with adequate natural dentition. He completed oral care with suction after set up.  Following oral care, pt accepted trials of ice chips, thin liquid, puree, and solid textures. Timely oral prep and adequate clearing. No overt s/s aspiration observed or reported across textures. Rec continue current diet.   Pt with globus sensation with large pills prior to admit, and intermittent belching during eval. Recommend consideration of esophagram to assess esophageal motility. Pt reports hoarseness x1 week following vomiting. No report of choking on vomit, however. Could be related to suspected esophageal issues. Recommend consideration of ENT consult if voice quality does not improve or worsens. RN/MD informed of results and recommendations. ST signing off. Please reconsult if needs arise.  SLP Visit Diagnosis:  Dysphagia, unspecified (R13.10)    Aspiration Risk  Mild aspiration risk    Diet Recommendation Regular;Thin liquid    Liquid Administration via: Cup;Straw Medication Administration: Other (Comment) (as tolerated - break large pills) Compensations: Minimize environmental distractions;Small sips/bites;Slow rate Postural Changes: Remain upright for at least 30 minutes after po intake;Seated upright at 90 degrees    Other  Recommendations Oral Care Recommendations: Oral care BID    Recommendations for follow up therapy are one component of a multi-disciplinary discharge planning process, led by the attending physician.  Recommendations may be updated based on patient status, additional functional criteria and insurance authorization.  Follow up Recommendations No SLP follow up      Functional Status Assessment Patient has not had a recent decline in their functional status      Prognosis Prognosis for improved oropharyngeal function: Good      Swallow Study   General Date of Onset: 06/26/23 HPI: 73yo male admitted 06/26/23 with renal failure and hypotension. PMH: AFib, HTN, HLD, DM, CKD3, recent admit for acute on chronic renal failure (1/31-06/10/23), left hydronephrosis. Type of Study: Bedside Swallow Evaluation Previous Swallow Assessment: none Diet Prior to this Study: Regular;Thin liquids (Level 0);Other (Comment) (renal restrictions) Temperature Spikes Noted: No Respiratory Status: Room air History of Recent Intubation: No Behavior/Cognition: Cooperative;Alert;Pleasant mood Oral Cavity Assessment: Within Functional Limits Oral Care Completed by SLP: Yes (pt completed oral care with suction) Oral Cavity - Dentition: Adequate natural dentition;Missing dentition;Other (Comment) (missing some teeth) Vision: Functional for self-feeding Self-Feeding Abilities: Able to feed self Patient Positioning: Upright in bed Baseline Vocal Quality: Hoarse Volitional Cough: Weak Volitional  Swallow: Able to elicit  Oral/Motor/Sensory Function Overall Oral Motor/Sensory Function: Within functional limits   Ice Chips Ice chips: Within functional limits Presentation: Spoon   Thin Liquid Thin Liquid: Within functional limits Presentation: Cup;Straw    Nectar Thick Nectar Thick Liquid: Not tested   Honey Thick Honey Thick Liquid: Not tested   Puree Puree: Within functional limits Presentation: Spoon   Solid     Solid: Within functional limits Presentation: Self Fed     Calvin Schultz B. Murvin Natal, [facility], CCC-SLP Speech Language Pathologist  Leigh Aurora 06/29/2023,10:44 AM

## 2023-06-29 NOTE — Progress Notes (Signed)
 Initial Nutrition Assessment  DOCUMENTATION CODES:   Obesity unspecified  INTERVENTION:  - Liberalize diet to Carb Modified and remove renal restriction to allow a greater variety of menu options.  - Ensure Plus High Protein po BID, each supplement provides 350 kcal and 20 grams of protein. - Add Rena-vit to support micronutrient needs while on CRRT.  NUTRITION DIAGNOSIS:   Increased nutrient needs related to acute illness (AKI superimposed on CKD stage IV - on CRRT) as evidenced by estimated needs.  GOAL:   Patient will meet greater than or equal to 90% of their needs  MONITOR:   PO intake, Supplement acceptance, Weight trends  REASON FOR ASSESSMENT:   Consult Assessment of nutrition requirement/status  ASSESSMENT:   73 y.o. male with PMH CKD stage 3, Diabetes mellitus, HLD, HTN who presented with N/V/D and left lower quadrant pain. Admitted for renal failure and hypotension.  2/21 Admit 2/22 CRRT initiated  Patient reports a UBW of 215# and possible weight loss over the past 1 month since being sick.  Per EMR, only weight history over the past year starts in January. Weight appears stable/trending up over the past month. Patient is +2L which may be affecting weight status.   Patient endorses still eating well over the past month despite not feeling well. Typically eats 2 meals a day, breakfast and dinner. Appetite has been normal.   He admits he has been trying to eat 3 meals a day since admission but does not like the diet he is on (renal/carb mod). Discussed liberalizing diet to allow more choices and patient appreciative. Discussed with CCM who was agreeable to removing renal diet restriction while on CRRT.   Patient agreeable to try Ensure during admission to support intake. Discussed increased nutrient needs while on CRRT.    Admit weight: 223# Current weight: 238# I&O's: +2.6L   Medications reviewed and include:  Levophed @ 6 mcg/min  Labs reviewed:  Na  132 K+ 3.4 Creatinine 2.80 HA1C 6.2 Blood Glucose 112-181 x24 hours   NUTRITION - FOCUSED PHYSICAL EXAM:  Flowsheet Row Most Recent Value  Orbital Region No depletion  Upper Arm Region No depletion  Thoracic and Lumbar Region No depletion  Buccal Region No depletion  Temple Region No depletion  Clavicle Bone Region No depletion  Clavicle and Acromion Bone Region No depletion  Scapular Bone Region Unable to assess  Dorsal Hand No depletion  Patellar Region No depletion  Anterior Thigh Region No depletion  Posterior Calf Region No depletion  Edema (RD Assessment) Mild  Hair Reviewed  Eyes Reviewed  Mouth Reviewed  Skin Reviewed  Nails Reviewed       Diet Order:   Diet Order             Diet Carb Modified Fluid consistency: Thin; Room service appropriate? Yes; Fluid restriction: 2000 mL Fluid  Diet effective now                   EDUCATION NEEDS:  Education needs have been addressed  Skin:  Skin Assessment: Reviewed RN Assessment  Last BM:  2/24 - type 7 - rectal tube  Height:  Ht Readings from Last 1 Encounters:  06/26/23 5\' 9"  (1.753 m)   Weight:  Wt Readings from Last 1 Encounters:  06/29/23 108.2 kg   Ideal Body Weight:  72.73 kg  BMI:  Body mass index is 35.23 kg/m.  Estimated Nutritional Needs:  Kcal:  2200-2500 kcals Protein:  130-145 grams Fluid:  >/=  2.2L    Shelle Iron RD, LDN Contact via [employer].

## 2023-06-29 NOTE — Progress Notes (Signed)
 [facility] Progress Note  Subjective:  He had 350 mL uop over 2/23.  He had 3.4 liters UF over 2/23 with CRRT.  UF goal of keep even as tolerated.  His RN reports they are trying to get his levo down but that his BP drops to the 70's.  Spoke with wife at bedside.  Has stone removal and stent placement scheduled for early March with urology  Review of systems:  Denies shortness of breath  Has lost his voice after multiple episodes of emesis per pt   Vitals:   06/29/23 1635 06/29/23 1636 06/29/23 1637 06/29/23 1638  BP:  (!) 80/43  (!) 97/53  Pulse: 74 (!) 54 75 72  Resp: (!) 26 (!) 26 (!) 22 12  Temp:      TempSrc:      SpO2: 90% (!) 87% 96% 96%  Weight:      Height:        Physical Exam:  General adult male in bed in no acute distress HEENT normocephalic atraumatic extraocular movements intact sclera anicteric Neck supple trachea midline Lungs clear to auscultation bilaterally normal work of breathing at rest on room air  Heart S1S2 no rub Abdomen soft nontender nondistended Extremities 1- 2+ edema  Psych normal mood and affect Neuro alert and oriented x 3 provides hx and follows commands Access left internal jugular nontunneled dialysis catheter     Renal-related home meds: - norvasc 5 - olmesartan 20 every day - fosrenol 750 mg ac tid - renvela 800 ac tid - others: insulin, eliquis       Date                             Creat               eGFR (ml/min) 04/18/2021                  1.87                 39 06/25/21                        2.00                 36 ml/min 07/2021                         1.73                 42 09/2021                         2.17                 32 12/2021                         2.51                 27 01/2022                         2.48                 28 ml/min, CKD 4 05/30/23- 06/05/23 4.43- 5.34 11- 14 ml/min 06/26/23  10.8                 5 ml/min   UA - prot > 300, mod Hb, large LE, 11-20 rbcs >  50 wbc, 0-5 epi UNa 114, UCr 20 CT renal stone 2/21 - Adrenals/Urinary Tract: Adrenal glands appear normal. Bilateral nephrolithiasis is noted. Percutaneous left nephrostomy is noted. No hydronephrosis or renal obstruction is noted. Urinary bladder is unremarkable.  CXR 2/21 - IMPRESSION: 1. No acute intrathoracic process.       Assessment/ Plan: AKI on CKD 4 - b/l creat 4.5- 5.3 from jan 2025, eGFR 11-14 ml/min. Creat here was 10.8 in the setting of hypotension, new dx of Cdif (1 wk diarrhea, n/v), swollen legs and SOB. Rec'd 3 liters fluids in ED and did not look SOB, however due to his leg swelling no more boluses were given and they started levophed to treat the hypotension. UA wbc > rbc, prot > 300. CT w/o obstruction, has L neph tube. Suspect AKI is ischemic and pre-renal insults related to hypotension +/- ARB and intravasc vol depletion.  Creat did not improve and due to sig azotemia and metabolic acidosis, CRRT was started on 2/22 Continue CRRT.  4 K fluids UF goal keep even as tolerated.  Please page nephrology for UF goal changes  Metabolic acidosis - off bicarb Shock/ hypotension - possibly related to colitis/ Cdif. IV abx per CCM.  Giving albumin once   L nephrostomy tube - placed in Jan 2025 by IR, and is f/b urology. Has staghorn calculi on the L and is supposed to have stone removed and stent placed per his wife on March 7th at [facility].  Chronic atrial fib - on eliquis; per primary team  Bilat LE edema - from venous insufficiency vs renal failure vs heart failure. On room air  Disposition - in the ICU on CRRT     Recent Labs  Lab 06/28/23 0544 06/28/23 1633 06/29/23 0239 06/29/23 1612  HGB 8.6*  --  8.8*  --   ALBUMIN 2.1*   < > 2.0* 2.0*  CALCIUM 7.1*   < > 7.6* 7.4*  PHOS 4.2   < > 2.7 2.4*  CREATININE 4.78*   < > 2.80* 2.18*  K 3.2*   < > 3.4* 4.0   < > = values in this interval not displayed.   No results for input(s): "IRON", "TIBC", "FERRITIN" in the last  168 hours. Inpatient medications:  apixaban  2.5 mg Oral BID   Chlorhexidine Gluconate Cloth  6 each Topical Daily   feeding supplement  237 mL Oral BID BM   insulin aspart  0-9 Units Subcutaneous TID WC   multivitamin  1 tablet Oral QHS   vancomycin  500 mg Oral Q6H    heparin 10,000 units/ 20 mL infusion syringe 500 Units/hr (06/29/23 0203)   metronidazole Stopped (06/29/23 1506)   norepinephrine (LEVOPHED) Adult infusion 6 mcg/min (06/29/23 1600)   prismasol BGK 4/2.5 1,500 mL/hr at 06/29/23 1628   prismasol BGK 4/2.5 400 mL/hr at 06/29/23 1651   prismasol BGK 4/2.5 400 mL/hr at 06/29/23 0255   acetaminophen, alum & mag hydroxide-simeth, heparin, HYDROmorphone (DILAUDID) injection, ondansetron (ZOFRAN) IV, mouth rinse, oxyCODONE, tiZANidine, traZODone   Estanislado Emms, MD 5:35 PM 06/29/2023

## 2023-06-30 DIAGNOSIS — N179 Acute kidney failure, unspecified: Secondary | ICD-10-CM | POA: Diagnosis not present

## 2023-06-30 DIAGNOSIS — N184 Chronic kidney disease, stage 4 (severe): Secondary | ICD-10-CM | POA: Diagnosis not present

## 2023-06-30 DIAGNOSIS — A419 Sepsis, unspecified organism: Secondary | ICD-10-CM | POA: Diagnosis not present

## 2023-06-30 DIAGNOSIS — R6521 Severe sepsis with septic shock: Secondary | ICD-10-CM | POA: Diagnosis not present

## 2023-06-30 LAB — CBC
HCT: 27.3 % — ABNORMAL LOW (ref 39.0–52.0)
Hemoglobin: 8 g/dL — ABNORMAL LOW (ref 13.0–17.0)
MCH: 26.8 pg (ref 26.0–34.0)
MCHC: 29.3 g/dL — ABNORMAL LOW (ref 30.0–36.0)
MCV: 91.6 fL (ref 80.0–100.0)
Platelets: 281 10*3/uL (ref 150–400)
RBC: 2.98 MIL/uL — ABNORMAL LOW (ref 4.22–5.81)
RDW: 14.8 % (ref 11.5–15.5)
WBC: 21.1 10*3/uL — ABNORMAL HIGH (ref 4.0–10.5)
nRBC: 0 % (ref 0.0–0.2)

## 2023-06-30 LAB — RENAL FUNCTION PANEL
Albumin: 2.3 g/dL — ABNORMAL LOW (ref 3.5–5.0)
Albumin: 2.4 g/dL — ABNORMAL LOW (ref 3.5–5.0)
Anion gap: 7 (ref 5–15)
Anion gap: 5 (ref 5–15)
BUN: 13 mg/dL (ref 8–23)
BUN: 11 mg/dL (ref 8–23)
CO2: 26 mmol/L (ref 22–32)
CO2: 25 mmol/L (ref 22–32)
Calcium: 7.7 mg/dL — ABNORMAL LOW (ref 8.9–10.3)
Calcium: 7.4 mg/dL — ABNORMAL LOW (ref 8.9–10.3)
Chloride: 101 mmol/L (ref 98–111)
Chloride: 103 mmol/L (ref 98–111)
Creatinine, Ser: 1.81 mg/dL — ABNORMAL HIGH (ref 0.61–1.24)
Creatinine, Ser: 1.47 mg/dL — ABNORMAL HIGH (ref 0.61–1.24)
GFR, Estimated: 40 mL/min — ABNORMAL LOW
GFR, Estimated: 51 mL/min — ABNORMAL LOW (ref >60)
Glucose, Bld: 133 mg/dL — ABNORMAL HIGH (ref 70–99)
Glucose, Bld: 183 mg/dL — ABNORMAL HIGH (ref 70–99)
Phosphorus: 2.6 mg/dL (ref 2.5–4.6)
Phosphorus: 2 mg/dL — ABNORMAL LOW (ref 2.5–4.6)
Potassium: 3.8 mmol/L (ref 3.5–5.1)
Potassium: 3.9 mmol/L (ref 3.5–5.1)
Sodium: 134 mmol/L — ABNORMAL LOW (ref 135–145)
Sodium: 133 mmol/L — ABNORMAL LOW (ref 135–145)

## 2023-06-30 LAB — GLUCOSE, CAPILLARY
Glucose-Capillary: 125 mg/dL — ABNORMAL HIGH (ref 70–99)
Glucose-Capillary: 133 mg/dL — ABNORMAL HIGH (ref 70–99)
Glucose-Capillary: 156 mg/dL — ABNORMAL HIGH (ref 70–99)
Glucose-Capillary: 150 mg/dL — ABNORMAL HIGH (ref 70–99)

## 2023-06-30 LAB — APTT: aPTT: 34 s (ref 24–36)

## 2023-06-30 LAB — MAGNESIUM: Magnesium: 2.2 mg/dL (ref 1.7–2.4)

## 2023-06-30 MED ORDER — SODIUM PHOSPHATES 45 MMOLE/15ML IV SOLN
15.0000 mmol | Freq: Once | INTRAVENOUS | Status: AC
  Administered 2023-06-30: 15 mmol via INTRAVENOUS
  Filled 2023-06-30: qty 5

## 2023-06-30 MED ORDER — SIMETHICONE 80 MG PO CHEW
80.0000 mg | CHEWABLE_TABLET | Freq: Four times a day (QID) | ORAL | Status: DC | PRN
  Administered 2023-06-30 – 2023-07-01 (×3): 80 mg via ORAL
  Filled 2023-06-30 (×3): qty 1

## 2023-06-30 MED ORDER — MENTHOL 3 MG MT LOZG
1.0000 | LOZENGE | OROMUCOSAL | Status: DC | PRN
  Administered 2023-06-30: 3 mg via ORAL
  Filled 2023-06-30: qty 9

## 2023-06-30 MED ORDER — HEPARIN SODIUM (PORCINE) 1000 UNIT/ML DIALYSIS
1000.0000 [IU] | INTRAMUSCULAR | Status: DC | PRN
  Administered 2023-06-30: 2800 [IU] via INTRAVENOUS_CENTRAL
  Filled 2023-06-30: qty 6

## 2023-06-30 NOTE — Progress Notes (Signed)
 NAME:  Calvin Schultz, MRN:  [id number], DOB:  08-09-1950, LOS: 4 ADMISSION DATE:  06/26/2023, CONSULTATION DATE: 2/21 REFERRING MD: Dr. Anitra Lauth, CHIEF COMPLAINT: Renal failure, hypotension  History of Present Illness:  73 year old male with past medical history as below, which is significant for atrial fibrillation on Eliquis, hypertension, diabetes mellitus, CKD, and recently admitted for acute on chronic renal failure with left-sided hydronephrosis secondary to several calculi in the left renal collecting system with possible staghorn calculus concerning for left UPJ stricture.  Nephrostomy tube was placed on the left and the patient was discharged with the nephrostomy tube as well as ongoing antibiotics transitioned from ceftriaxone to Bactrim p.o.  Also treated for lower extremity edema during that admission with aggressive IV Lasix diuresis.  Initial post discharge course was unremarkable, but ultimately the patient began to experience worsening weakness associated with nausea, vomiting, and diarrhea as well as left lower quadrant pain.  He has some difficulty walking at baseline due to knee pain but as a result of the mentioned symptoms he became unable to walk at all due to weakness.  This is what his family called EMS and he was transported to [facility] emergency department on 2/21.  Upon arrival to the emergency department he was noted to be hypotensive and IV fluid resuscitation was initiated.  Initially blood pressure did respond to IV fluids, however, he was ultimately required to be started on norepinephrine infusion.  Workup in the ED included CT scan which was concerning for colitis of the descending and sigmoid colon.  C. difficile antigen and toxin positive.  He was started on appropriate antibiotics.  PCCM was asked to evaluate in the setting of hypotension.  Pertinent  Medical History   has a past medical history of Arthritis, CKD (chronic kidney disease) stage 3, GFR 30-59 ml/min  (HCC), Diabetes mellitus without complication (HCC), Hyperlipidemia, and Hypertension.  Significant Hospital Events: Including procedures, antibiotic start and stop dates in addition to other pertinent events   1/31-2/5 admit to [facility] for acute on chronic renal failure. Left UJP stone, hydro. Nephrostomy tube placed.  2/21 admitted to [facility] with shock, dehydration, and acute renal failure.  2/22 started CRRT 2/23 Critically ill, remains on 10 mics of Levophed. Tolerating CRRT  2/24 Remains on CRRT with ongoing ABD pain  Interim History / Subjective:   Critically ill, remains on CRRT. On low-dose pressors Complains of abdominal cramps Minimal urine output Diarrhea can   Objective   Blood pressure (!) 105/44, pulse 72, temperature 98.1 F (36.7 C), temperature source Oral, resp. rate 14, height 5\' 9"  (1.753 m), weight 101.7 kg, SpO2 100%.        Intake/Output Summary (Last 24 hours) at 06/30/2023 1009 Last data filed at 06/30/2023 1000 Gross per 24 hour  Intake 2936.96 ml  Output 3066 ml  Net -129.04 ml   Filed Weights   06/29/23 0500 06/30/23 0500 06/30/23 0745  Weight: 108.2 kg 102.7 kg 101.7 kg    Examination: General: Acute on chronically ill appearing elderly male lying in bed on CRRT and mechanical ventilation, in NAD HEENT: ETT, MM pink/moist, PERRL,  Neuro: Alert and oriented x3, non-focal  CV: s1s2 irregular, rate controlled PULM:  Clear to auscultation, no increased work of breathing, no added breath sounds  GI: soft, bowel sounds active in all 4 quadrants, non-tender, non-distended, tolerating oral diet  Extremities: warm/dry, no edema  Skin: no rashes or lesions  Labs show improving hyponatremia, albumin 2.3, slight decrease in leukocytosis  Resolved Hospital Problem list     Assessment & Plan:   Septic shock -in the setting of C-diff colitis, blood cultures remain negative x3 days  P: Continue pressor for MAP goal > 65, added midodrine to enable tapering  Levophed to off Volume removal per CRRT    AKI superimposed on CKD stage IV -baseline creatinine 4.9 Status post left nephrostomy  -Nephrostomy tube placed 1/29 by IR with plan for ureteric stent placement 3/21-has staghorn calculi on left P: Nephrology following appreciate assistance  CRRT per Nephrology , once off pressors can give him a break and see if IHD required Trend Bmet Avoid nephrotoxins Zanaflex for leg cramps   C. difficile colitis -He does not seem to have been on long-term antibiotics, complains of diarrhea for a year P: Continue oral vancomycin and IV Flagyl  Consider fidaxomicin  Chronic atrial fibrillation - On Eliquis at baseline  P: Continue home Eliquis   Diabetes type 2  -may have lowered insulin requirements P: SSI  CBG goal 140-180 CBG ACHS    Best Practice (right click and "Reselect all SmartList Selections" daily)   Diet/type: Regular consistency (see orders) DVT prophylaxis DOAC Pressure ulcer(s): N/A GI prophylaxis: N/A Lines: N/A Foley:  N/A Code Status:  full code Last date of multidisciplinary goals of care discussion [patient and spouse updated at bedside]  Critical care time:   CRITICAL CARE Performed by: Comer Locket Finneus Kaneshiro   Total critical care time: 32 minutes  Critical care time was exclusive of separately billable procedures and treating other patients.  Critical care was necessary to treat or prevent imminent or life-threatening deterioration.  Critical care was time spent personally by me on the following activities: development of treatment plan with patient and/or surrogate as well as nursing, discussions with consultants, evaluation of patient's response to treatment, examination of patient, obtaining history from patient or surrogate, ordering and performing treatments and interventions, ordering and review of laboratory studies, ordering and review of radiographic studies, pulse oximetry and re-evaluation of patient's  condition.  Cyril Mourning MD. Tonny Bollman. [facility] Pager : [phone]  If no response to pager , please call [phone] until 7 pm After 7:00 pm call Elink  [phone]    06/30/2023, 10:09 AM

## 2023-06-30 NOTE — Plan of Care (Signed)
  Problem: Coping: Goal: Ability to adjust to condition or change in health will improve Outcome: Progressing   Problem: Fluid Volume: Goal: Ability to maintain a balanced intake and output will improve Outcome: Progressing   Problem: Clinical Measurements: Goal: Ability to maintain clinical measurements within normal limits will improve Outcome: Progressing Goal: Diagnostic test results will improve Outcome: Progressing   Problem: Coping: Goal: Level of anxiety will decrease Outcome: Progressing

## 2023-06-30 NOTE — Progress Notes (Signed)
 eLink Physician-Brief Progress Note Patient Name: Calvin Schultz DOB: 10/28/1950 MRN: [id number]   Date of Service  06/30/2023  HPI/Events of Note  Notified of atrial flutter with rate at 74.  EKG confirms this.  BP 110/56, RR 11, O2 sats 98%.  Pt is on levophed.   eICU Interventions  Continue to monitor.  Goal is rate control to keep HR <110.      Intervention Category Intermediate Interventions: Arrhythmia - evaluation and management  Larinda Buttery 06/30/2023, 1:57 AM

## 2023-06-30 NOTE — Progress Notes (Signed)
 [facility] Progress Note  Subjective:  He had 175 mL uop over 2/24.  He had 3.0 liters UF over 2/24 with CRRT.  Spoke with pt and his wife at bedside.  Spoke with primary team - we discussed stopping CRRT today.   Levo was weaned from 6 mcg/min to off and now back on 2 mcg/min.  Primary team hoping to get off of levo.  They are planning to watch for a day in the ICU hopefully then transition to a floor bed at [facility]  Review of systems:    Denies shortness of breath or chest pain states lost his voice after multiple episodes of emesis; n/v better  Vitals:   06/30/23 1515 06/30/23 1530 06/30/23 1545 06/30/23 1600  BP: (!) 96/52 (!) 123/53 (!) 129/58 122/63  Pulse:    73  Resp:    10  Temp:      TempSrc:      SpO2:    100%  Weight:      Height:        Physical Exam:  General adult male in bed in no acute distress HEENT normocephalic atraumatic extraocular movements intact sclera anicteric Neck supple trachea midline Lungs clear to auscultation bilaterally normal work of breathing at rest on room air  Heart S1S2 no rub Abdomen soft nontender nondistended Extremities 1- 2+ edema lower extremities Psych normal mood and affect Neuro alert and oriented x 3 provides hx and follows commands Access left internal jugular nontunneled dialysis catheter     Renal-related home meds: - norvasc 5 - olmesartan 20 every day - fosrenol 750 mg ac tid - renvela 800 ac tid - others: insulin, eliquis       Date                             Creat               eGFR (ml/min) 04/18/2021                  1.87                 39 06/25/21                        2.00                 36 ml/min 07/2021                         1.73                 42 09/2021                         2.17                 32 12/2021                         2.51                 27 01/2022                         2.48                 28 ml/min, CKD 4 05/30/23- 06/05/23 4.43- 5.34 11- 14  ml/min 06/26/23                         10.8                 5 ml/min   UA - prot > 300, mod Hb, large LE, 11-20 rbcs > 50 wbc, 0-5 epi UNa 114, UCr 20 CT renal stone 2/21 - Adrenals/Urinary Tract: Adrenal glands appear normal. Bilateral nephrolithiasis is noted. Percutaneous left nephrostomy is noted. No hydronephrosis or renal obstruction is noted. Urinary bladder is unremarkable.  CXR 2/21 - IMPRESSION: 1. No acute intrathoracic process.       Assessment/ Plan: AKI on CKD 4 - b/l creat 4.5- 5.3 from jan 2025, eGFR 11-14 ml/min. Creat here was 10.8 in the setting of hypotension, new dx of Cdif (1 wk diarrhea, n/v), swollen legs and SOB. Rec'd 3 liters fluids in ED and did not look SOB, however due to his leg swelling no more boluses were given and they started levophed to treat the hypotension. UA wbc > rbc, prot > 300. CT w/o obstruction, has L neph tube. Suspect AKI is ischemic and pre-renal insults related to hypotension +/- ARB and intravasc vol depletion.  Creat did not improve and due to sig azotemia and metabolic acidosis, CRRT was started on 2/22 Stop CRRT Assess dialysis needs daily  Metabolic acidosis - off bicarb Shock/ hypotension - possibly related to colitis/ Cdif. IV abx per CCM. improving L nephrostomy tube - placed in Jan 2025 by IR, and is f/b urology. Has staghorn calculi on the L and is supposed to have stone removed and stent placed per his wife on March 7th at [facility].  Chronic atrial fib - on eliquis; per primary team  Bilat LE edema - from venous insufficiency vs renal failure vs heart failure. On room air Hypophosphatemia - from CRRT - replete  Disposition - continue inpatient monitoring.  Currently in ICU.  We are stopping CRRT   Recent Labs  Lab 06/29/23 0239 06/29/23 1612 06/30/23 0305 06/30/23 1519  HGB 8.8*  --  8.0*  --   ALBUMIN 2.0*   < > 2.3* 2.4*  CALCIUM 7.6*   < > 7.7* 7.4*  PHOS 2.7   < > 2.6 2.0*  CREATININE 2.80*   < > 1.81* 1.47*  K 3.4*   < > 3.8 3.9   < > =  values in this interval not displayed.   No results for input(s): "IRON", "TIBC", "FERRITIN" in the last 168 hours. Inpatient medications:  apixaban  2.5 mg Oral BID   Chlorhexidine Gluconate Cloth  6 each Topical Daily   feeding supplement  237 mL Oral BID BM   insulin aspart  0-9 Units Subcutaneous TID WC   midodrine  10 mg Oral TID WC   multivitamin  1 tablet Oral QHS   vancomycin  500 mg Oral Q6H    heparin 10,000 units/ 20 mL infusion syringe 500 Units/hr (06/30/23 1626)   metronidazole Stopped (06/30/23 1500)   norepinephrine (LEVOPHED) Adult infusion 2 mcg/min (06/30/23 1600)   prismasol BGK 4/2.5 1,500 mL/hr at 06/30/23 1512   prismasol BGK 4/2.5 400 mL/hr at 06/30/23 0528   prismasol BGK 4/2.5 400 mL/hr at 06/30/23 4098   acetaminophen, heparin, HYDROmorphone (DILAUDID) injection, menthol-cetylpyridinium, ondansetron (ZOFRAN) IV, mouth rinse, oxyCODONE, simethicone, tiZANidine, traZODone   Estanislado Emms, MD 4:58 PM 06/30/2023

## 2023-07-01 ENCOUNTER — Inpatient Hospital Stay (HOSPITAL_COMMUNITY): Payer: No Typology Code available for payment source

## 2023-07-01 DIAGNOSIS — N183 Chronic kidney disease, stage 3 unspecified: Secondary | ICD-10-CM | POA: Diagnosis not present

## 2023-07-01 DIAGNOSIS — A0472 Enterocolitis due to Clostridium difficile, not specified as recurrent: Secondary | ICD-10-CM | POA: Diagnosis not present

## 2023-07-01 DIAGNOSIS — N170 Acute kidney failure with tubular necrosis: Secondary | ICD-10-CM | POA: Diagnosis not present

## 2023-07-01 DIAGNOSIS — A419 Sepsis, unspecified organism: Secondary | ICD-10-CM | POA: Diagnosis not present

## 2023-07-01 LAB — RENAL FUNCTION PANEL
Albumin: 2.1 g/dL — ABNORMAL LOW (ref 3.5–5.0)
Anion gap: 8 (ref 5–15)
BUN: 14 mg/dL (ref 8–23)
CO2: 22 mmol/L (ref 22–32)
Calcium: 7.8 mg/dL — ABNORMAL LOW (ref 8.9–10.3)
Chloride: 103 mmol/L (ref 98–111)
Creatinine, Ser: 2.16 mg/dL — ABNORMAL HIGH (ref 0.61–1.24)
GFR, Estimated: 32 mL/min — ABNORMAL LOW (ref >60)
Glucose, Bld: 131 mg/dL — ABNORMAL HIGH (ref 70–99)
Phosphorus: 2.7 mg/dL (ref 2.5–4.6)
Potassium: 3.6 mmol/L (ref 3.5–5.1)
Sodium: 133 mmol/L — ABNORMAL LOW (ref 135–145)

## 2023-07-01 LAB — CBC
HCT: 27.2 % — ABNORMAL LOW (ref 39.0–52.0)
Hemoglobin: 7.9 g/dL — ABNORMAL LOW (ref 13.0–17.0)
MCH: 26.9 pg (ref 26.0–34.0)
MCHC: 29 g/dL — ABNORMAL LOW (ref 30.0–36.0)
MCV: 92.5 fL (ref 80.0–100.0)
Platelets: 262 10*3/uL (ref 150–400)
RBC: 2.94 MIL/uL — ABNORMAL LOW (ref 4.22–5.81)
RDW: 14.9 % (ref 11.5–15.5)
WBC: 22.4 10*3/uL — ABNORMAL HIGH (ref 4.0–10.5)
nRBC: 0 % (ref 0.0–0.2)

## 2023-07-01 LAB — CULTURE, BLOOD (ROUTINE X 2)
Culture: NO GROWTH
Culture: NO GROWTH

## 2023-07-01 LAB — GLUCOSE, CAPILLARY
Glucose-Capillary: 131 mg/dL — ABNORMAL HIGH (ref 70–99)
Glucose-Capillary: 202 mg/dL — ABNORMAL HIGH (ref 70–99)
Glucose-Capillary: 197 mg/dL — ABNORMAL HIGH (ref 70–99)
Glucose-Capillary: 149 mg/dL — ABNORMAL HIGH (ref 70–99)

## 2023-07-01 LAB — MAGNESIUM: Magnesium: 2.1 mg/dL (ref 1.7–2.4)

## 2023-07-01 NOTE — Plan of Care (Signed)
  Problem: Coping: Goal: Ability to adjust to condition or change in health will improve Outcome: Progressing   Problem: Clinical Measurements: Goal: Diagnostic test results will improve Outcome: Progressing   Problem: Coping: Goal: Level of anxiety will decrease Outcome: Progressing   Problem: Pain Managment: Goal: General experience of comfort will improve and/or be controlled Outcome: Progressing

## 2023-07-01 NOTE — Progress Notes (Addendum)
 eLink Physician-Brief Progress Note Patient Name: Calvin Schultz DOB: April 18, 1951 MRN: [id number]   Date of Service  07/01/2023  HPI/Events of Note  RN called to report intermittent bradycardia. Not sustained. Has had this issue last night and during the day as well along with some pauses. In A fib. Is on 1 mic/min of levophed. Very comfy on camera. HR currently in 57-58. BP is ok. CRRT is off. EKG done last around 1 am. Renal notes mention plan for cardiology consult but not sure if this was done, no notes in chart and RN unaware of it as well. No sustained bradycardia.   eICU Interventions  Recheck EKG Continue close monitoring If significant drops occur then will need to call cardiology We can switch to dopamine also if HR drops but I think current rates are ok since not associated with any symptoms or bp issues     Intervention Category Major Interventions: Arrhythmia - evaluation and management  Jian Hodgman G Emelee Rodocker 07/01/2023, 11:11 PM

## 2023-07-01 NOTE — Progress Notes (Addendum)
 eLink Physician-Brief Progress Note Patient Name: Calvin Schultz DOB: [birth year] MRN: [id number]   Date of Service  07/01/2023  HPI/Events of Note  Notified of change in rhythm with ST depression.  eICU Interventions  Check EKG.      Intervention Category Intermediate Interventions: Arrhythmia - evaluation and management  Larinda Buttery 07/01/2023, 1:03 AM  1:16 AM EKG shows sinus rhythm with RBBB which is not new.   HR in the 70s.    Plan> Continue current management.

## 2023-07-01 NOTE — Progress Notes (Signed)
 [facility] Progress Note  Subjective:  He had 245 mL uop over 2/25 as well as one unmeasured urine void.   He had 392 mL UF over 2/25 with CRRT - team had requested keep even to try and get off of levo so we discontinued CRRT.  Spoke with his wife at bedside and his primary team.  CCM has asked cardiology to consult re: bradycardia.    Review of systems:    Denies shortness of breath or chest pain Denies n/v  Vitals:   07/01/23 1205 07/01/23 1215 07/01/23 1226 07/01/23 1300  BP:  (!) 129/44  (!) 81/59  Pulse:   (!) 37 69  Resp:   15 14  Temp: 98.4 F (36.9 C)     TempSrc: Oral     SpO2:   100% 100%  Weight:      Height:        Physical Exam:  General adult male in bed in no acute distress HEENT normocephalic atraumatic extraocular movements intact sclera anicteric Neck supple trachea midline Lungs clear to auscultation bilaterally normal work of breathing at rest on room air  Heart S1S2 no rub Abdomen soft nontender nondistended Extremities 1- 2+ edema lower extremities Psych normal mood and affect Neuro alert and oriented x 3 provides hx and follows commands Access left internal jugular nontunneled dialysis catheter     Renal-related home meds: - norvasc 5 - olmesartan 20 every day - fosrenol 750 mg ac tid - renvela 800 ac tid - others: insulin, eliquis       Date                             Creat               eGFR (ml/min) 04/18/2021                  1.87                 39 06/25/21                        2.00                 36 ml/min 07/2021                         1.73                 42 09/2021                         2.17                 32 12/2021                         2.51                 27 01/2022                         2.48                 28 ml/min, CKD 4 05/30/23- 06/05/23 4.43- 5.34 11- 14 ml/min 06/26/23  10.8                 5 ml/min   UA - prot > 300, mod Hb, large LE, 11-20 rbcs > 50 wbc, 0-5 epi UNa 114, UCr  20 CT renal stone 2/21 - Adrenals/Urinary Tract: Adrenal glands appear normal. Bilateral nephrolithiasis is noted. Percutaneous left nephrostomy is noted. No hydronephrosis or renal obstruction is noted. Urinary bladder is unremarkable.  CXR 2/21 - IMPRESSION: 1. No acute intrathoracic process.       Assessment/ Plan: AKI on CKD 4 - b/l creat 4.5- 5.3 from jan 2025, eGFR 11-14 ml/min. Creat here was 10.8 in the setting of hypotension, new dx of Cdif (1 wk diarrhea, n/v), swollen legs and SOB. Rec'd 3 liters fluids in ED and did not look SOB, however due to his leg swelling no more boluses were given and they started levophed to treat the hypotension. UA wbc > rbc, prot > 300. CT w/o obstruction, has L neph tube. Suspect AKI is ischemic and pre-renal insults related to hypotension +/- ARB and intravasc vol depletion.  Creat did not improve and due to sig azotemia and metabolic acidosis, CRRT was started on 2/22.  Stopped 2/25 Assess dialysis needs daily  No acute indication for renal replacement therapy - urine output has been low and anticipate he will need HD in the next 24-48 hours Discussed with team - he is to be transferred to [facility] once stable for the floor Metabolic acidosis -improved with CRRT Shock/ hypotension - possibly related to colitis/ Cdif. IV abx per CCM.  Pressors per primary team  L nephrostomy tube - placed in Jan 2025 by IR, and is f/b urology. Has staghorn calculi on the L and is supposed to have stone removed and stent placed per his wife on March 7th at [facility].  Chronic atrial fib - on eliquis; per primary team  Hypophosphatemia - from CRRT - improved with repletion Bradycardia - CCM has consulted cardiology.    Disposition - continue inpatient monitoring.  Currently in ICU      Recent Labs  Lab 06/30/23 0305 06/30/23 1519 07/01/23 0718  HGB 8.0*  --  7.9*  ALBUMIN 2.3* 2.4* 2.1*  CALCIUM 7.7* 7.4* 7.8*  PHOS 2.6 2.0* 2.7  CREATININE 1.81* 1.47* 2.16*  K  3.8 3.9 3.6   No results for input(s): "IRON", "TIBC", "FERRITIN" in the last 168 hours. Inpatient medications:  apixaban  2.5 mg Oral BID   Chlorhexidine Gluconate Cloth  6 each Topical Daily   feeding supplement  237 mL Oral BID BM   insulin aspart  0-9 Units Subcutaneous TID WC   midodrine  10 mg Oral TID WC   multivitamin  1 tablet Oral QHS   vancomycin  500 mg Oral Q6H    metronidazole 500 mg (07/01/23 1537)   norepinephrine (LEVOPHED) Adult infusion Stopped (07/01/23 1247)   acetaminophen, heparin, HYDROmorphone (DILAUDID) injection, menthol-cetylpyridinium, ondansetron (ZOFRAN) IV, mouth rinse, oxyCODONE, simethicone, tiZANidine, traZODone   Estanislado Emms, MD 4:58 PM 07/01/2023

## 2023-07-01 NOTE — Progress Notes (Signed)
 NAME:  Calvin Schultz, MRN:  [id number], DOB:  05/30/1950, LOS: 5 ADMISSION DATE:  06/26/2023, CONSULTATION DATE: 2/21 REFERRING MD: Dr. Anitra Lauth, CHIEF COMPLAINT: Renal failure, hypotension  History of Present Illness:  73 year old male with past medical history as below, which is significant for atrial fibrillation on Eliquis, hypertension, diabetes mellitus, CKD, and recently admitted for acute on chronic renal failure with left-sided hydronephrosis secondary to several calculi in the left renal collecting system with possible staghorn calculus concerning for left UPJ stricture.  Nephrostomy tube was placed on the left and the patient was discharged with the nephrostomy tube as well as ongoing antibiotics transitioned from ceftriaxone to Bactrim p.o.  Also treated for lower extremity edema during that admission with aggressive IV Lasix diuresis.  Initial post discharge course was unremarkable, but ultimately the patient began to experience worsening weakness associated with nausea, vomiting, and diarrhea as well as left lower quadrant pain.  He has some difficulty walking at baseline due to knee pain but as a result of the mentioned symptoms he became unable to walk at all due to weakness.  This is what his family called EMS and he was transported to [facility] emergency department on 2/21.  Upon arrival to the emergency department he was noted to be hypotensive and IV fluid resuscitation was initiated.  Initially blood pressure did respond to IV fluids, however, he was ultimately required to be started on norepinephrine infusion.  Workup in the ED included CT scan which was concerning for colitis of the descending and sigmoid colon.  C. difficile antigen and toxin positive.  He was started on appropriate antibiotics.  PCCM was asked to evaluate in the setting of hypotension.  Pertinent  Medical History   has a past medical history of Arthritis, CKD (chronic kidney disease) stage 3, GFR 30-59 ml/min  (HCC), Diabetes mellitus without complication (HCC), Hyperlipidemia, and Hypertension.  Significant Hospital Events: Including procedures, antibiotic start and stop dates in addition to other pertinent events   1/31-2/5 admit to [facility] for acute on chronic renal failure. Left UJP stone, hydro. Nephrostomy tube placed.  2/21 admitted to [facility] with shock, dehydration, and acute renal failure.  2/22 started CRRT 2/23 Critically ill, remains on 10 mics of Levophed. Tolerating CRRT  2/24 Remains on CRRT with ongoing ABD pain  Interim History / Subjective:   Remains on CRRT, critically ill On low-dose Levophed. Complains of abdominal cramps Minimal urine output. Loose stool continues  Objective   Blood pressure (!) 106/48, pulse 75, temperature 98.4 F (36.9 C), temperature source Oral, resp. rate 16, height 5\' 9"  (1.753 m), weight 106.7 kg, SpO2 100%.        Intake/Output Summary (Last 24 hours) at 07/01/2023 1024 Last data filed at 07/01/2023 0730 Gross per 24 hour  Intake 1615.24 ml  Output 1061 ml  Net 554.24 ml   Filed Weights   06/30/23 0500 06/30/23 0745 07/01/23 0500  Weight: 102.7 kg 101.7 kg 106.7 kg    Examination: General: Acute on chronically ill appearing elderly male, no distress on CRRT HEENT: ETT, MM pink/moist, PERRL,  Neuro: Alert and oriented x3, non-focal  CV: s1s2 regular, rate controlled PULM:  Clear to auscultation, no increased work of breathing, GI: soft, bowel sounds+in all 4 quadrants, non-tender, non-distended, tolerating oral diet  Extremities: warm/dry, no edema  Skin: no rashes or lesions  Labs show improving hyponatremia, albumin 2.3, persistent leukocytosis  Resolved Hospital Problem list     Assessment & Plan:   Septic shock -  in the setting of C-diff colitis, blood cultures remain negative x3 days  P: On low-dose Levophed for MAP goal > 65 Continue midodrine    AKI superimposed on CKD stage IV -baseline creatinine 4.9 Status post  left nephrostomy  -Nephrostomy tube placed 1/29 by IR with plan for ureteric stent placement 3/21-has staghorn calculi on left P: Nephrology following appreciate assistance  CRRT stopped, will need IHD at some point once off pressors Trend Bmet , expect BUN to rise slowly Avoid nephrotoxins Zanaflex for leg cramps   C. difficile colitis -He does not seem to have been on long-term antibiotics, complains of diarrhea for a year P: Continue oral vancomycin and IV Flagyl  Consider fidaxomicin For abdomen today for abdominal cramps  Chronic atrial fibrillation - On Eliquis at baseline  P: Continue home Eliquis   Diabetes type 2  -may have lower insulin requirements P: SSI  CBG goal 140-180 CBG ACHS  We will plan for transfer to [facility] for intermittent dialysis eventually if he comes off pressors  Best Practice (right click and "Reselect all SmartList Selections" daily)   Diet/type: Regular consistency (see orders) DVT prophylaxis DOAC Pressure ulcer(s): N/A GI prophylaxis: N/A Lines: N/A Foley:  N/A Code Status:  full code Last date of multidisciplinary goals of care discussion [patient and spouse updated at bedside]  Critical care time:   CRITICAL CARE Performed by: Comer Locket Jayde Daffin   Total critical care time: 31 minutes  Critical care time was exclusive of separately billable procedures and treating other patients.  Critical care was necessary to treat or prevent imminent or life-threatening deterioration.  Critical care was time spent personally by me on the following activities: development of treatment plan with patient and/or surrogate as well as nursing, discussions with consultants, evaluation of patient's response to treatment, examination of patient, obtaining history from patient or surrogate, ordering and performing treatments and interventions, ordering and review of laboratory studies, ordering and review of radiographic studies, pulse oximetry and  re-evaluation of patient's condition.  Cyril Mourning MD. Tonny Bollman. [facility] Pager : [phone]  If no response to pager , please call [phone] until 7 pm After 7:00 pm call Elink  [phone]    07/01/2023, 10:24 AM

## 2023-07-02 ENCOUNTER — Ambulatory Visit (HOSPITAL_COMMUNITY)
Admission: EM | Admit: 2023-07-02 | Payer: No Typology Code available for payment source | Source: Other Acute Inpatient Hospital | Admitting: Urology

## 2023-07-02 DIAGNOSIS — I4891 Unspecified atrial fibrillation: Secondary | ICD-10-CM | POA: Diagnosis not present

## 2023-07-02 DIAGNOSIS — N183 Chronic kidney disease, stage 3 unspecified: Secondary | ICD-10-CM | POA: Diagnosis not present

## 2023-07-02 DIAGNOSIS — A0472 Enterocolitis due to Clostridium difficile, not specified as recurrent: Secondary | ICD-10-CM | POA: Diagnosis not present

## 2023-07-02 DIAGNOSIS — N170 Acute kidney failure with tubular necrosis: Secondary | ICD-10-CM | POA: Diagnosis not present

## 2023-07-02 LAB — CBC
HCT: 26.1 % — ABNORMAL LOW (ref 39.0–52.0)
Hemoglobin: 7.9 g/dL — ABNORMAL LOW (ref 13.0–17.0)
MCH: 27.1 pg (ref 26.0–34.0)
MCHC: 30.3 g/dL (ref 30.0–36.0)
MCV: 89.7 fL (ref 80.0–100.0)
Platelets: 271 10*3/uL (ref 150–400)
RBC: 2.91 MIL/uL — ABNORMAL LOW (ref 4.22–5.81)
RDW: 15.1 % (ref 11.5–15.5)
WBC: 23.5 10*3/uL — ABNORMAL HIGH (ref 4.0–10.5)
nRBC: 0 % (ref 0.0–0.2)

## 2023-07-02 LAB — GLUCOSE, CAPILLARY
Glucose-Capillary: 104 mg/dL — ABNORMAL HIGH (ref 70–99)
Glucose-Capillary: 105 mg/dL — ABNORMAL HIGH (ref 70–99)
Glucose-Capillary: 141 mg/dL — ABNORMAL HIGH (ref 70–99)
Glucose-Capillary: 114 mg/dL — ABNORMAL HIGH (ref 70–99)

## 2023-07-02 LAB — RENAL FUNCTION PANEL
Albumin: 2 g/dL — ABNORMAL LOW (ref 3.5–5.0)
Anion gap: 8 (ref 5–15)
BUN: 22 mg/dL (ref 8–23)
CO2: 23 mmol/L (ref 22–32)
Calcium: 7.5 mg/dL — ABNORMAL LOW (ref 8.9–10.3)
Chloride: 97 mmol/L — ABNORMAL LOW (ref 98–111)
Creatinine, Ser: 2.97 mg/dL — ABNORMAL HIGH (ref 0.61–1.24)
GFR, Estimated: 22 mL/min — ABNORMAL LOW (ref >60)
Glucose, Bld: 114 mg/dL — ABNORMAL HIGH (ref 70–99)
Phosphorus: 3.5 mg/dL (ref 2.5–4.6)
Potassium: 3.3 mmol/L — ABNORMAL LOW (ref 3.5–5.1)
Sodium: 128 mmol/L — ABNORMAL LOW (ref 135–145)

## 2023-07-02 LAB — MAGNESIUM: Magnesium: 2 mg/dL (ref 1.7–2.4)

## 2023-07-02 MED ORDER — POTASSIUM CHLORIDE CRYS ER 20 MEQ PO TBCR
20.0000 meq | EXTENDED_RELEASE_TABLET | Freq: Once | ORAL | Status: AC
  Administered 2023-07-02: 20 meq via ORAL
  Filled 2023-07-02: qty 1

## 2023-07-02 MED ORDER — ALTEPLASE 2 MG IJ SOLR
2.0000 mg | Freq: Once | INTRAMUSCULAR | Status: AC
  Administered 2023-07-02: 2 mg
  Filled 2023-07-02: qty 2

## 2023-07-02 NOTE — Consult Note (Signed)
 Cardiology Consultation   Patient ID: Calvin Schultz MRN: [id number]; DOB: May 29, 1950  Admit date: 06/26/2023 Date of Consult: 07/02/2023  PCP:  Gwenlyn Found, MD   [facility] Providers Cardiologist:  None        Patient Profile:   Calvin Schultz is a 73 y.o. male with a hx of atrial fibrillation on Eliquis, Hypertension, hyperlipidemia, type 2 diabetes, CKD,  hydronephrosis, who is being seen 07/02/2023 for the evaluation of chronic atrial fibrillation with episodes of bradycardia at the request of Cyril Mourning MD.  History of Present Illness:   Mr. Calvin Schultz was previously admitted for chronic renal failure with left-sided hydronephrosis secondary to calculi.  Nephrostomy tubes were placed.  During this admission was also treated with IV Lasix for lower extremity edema.  Being discharged patient had worsening weakness nausea vomiting and diarrhea with left lower quadrant pain.  When arriving to the emergency department by EMS was found to be hypotensive.  IV fluids were started.  Blood pressures initially improved but ended up requiring a norepinephrine infusion.  In the ER a CT scan found possible decending and sigmoid colitis and C. difficile toxin was positive.  Prior cardiac history Was previously seen Tolia Sunit DO on 08/22/2021 for paroxysmal A-fib.  At that time was on metoprolol XL 25 mg for rate control and Eliquis. Echo showe LVEF of 55-60%, mild LVH, and aortic sclerosis without stenosis  Past Medical History:  Diagnosis Date   Arthritis    CKD (chronic kidney disease) stage 3, GFR 30-59 ml/min (HCC)    Diabetes mellitus without complication (HCC)    Hyperlipidemia    Hypertension     Past Surgical History:  Procedure Laterality Date   IR NEPHROSTOMY PLACEMENT LEFT  06/03/2023   KIDNEY SURGERY       Home Medications:  Prior to Admission medications   Medication Sig Start Date End Date Taking? Authorizing Provider  amLODipine (NORVASC) 5 MG tablet Take 1  tablet (5 mg total) by mouth daily. 06/05/23  Yes Alexander-Savino, [place], MD  ELIQUIS 5 MG TABS tablet Take 5 mg by mouth 2 (two) times daily. 08/24/21  Yes [provider]  LEVEMIR FLEXTOUCH 100 UNIT/ML FlexTouch Pen Inject 10 Units into the skin as needed (high blood sugar). 06/27/21  Yes [provider]  olmesartan (BENICAR) 20 MG tablet Take 20 mg by mouth daily.   Yes [provider]  rosuvastatin (CRESTOR) 40 MG tablet Take 40 mg by mouth daily. 08/27/21  Yes [provider]  lanthanum (FOSRENOL) 750 MG chewable tablet Chew by mouth. Patient not taking: Reported on 06/26/2023    [provider]  sevelamer carbonate (RENVELA) 800 MG tablet Take 1 tablet (800 mg total) by mouth 3 (three) times daily with meals. Patient not taking: Reported on 06/26/2023 06/05/23   Manuela Neptune, MD    Inpatient Medications: Scheduled Meds:  apixaban  2.5 mg Oral BID   Chlorhexidine Gluconate Cloth  6 each Topical Daily   feeding supplement  237 mL Oral BID BM   insulin aspart  0-9 Units Subcutaneous TID WC   midodrine  10 mg Oral TID WC   multivitamin  1 tablet Oral QHS   vancomycin  500 mg Oral Q6H   Continuous Infusions:  metronidazole Stopped (07/02/23 4782)   norepinephrine (LEVOPHED) Adult infusion 2 mcg/min (07/02/23 0800)   PRN Meds: acetaminophen, heparin, HYDROmorphone (DILAUDID) injection, menthol-cetylpyridinium, ondansetron (ZOFRAN) IV, mouth rinse, oxyCODONE, simethicone, tiZANidine, traZODone  Allergies:   No Known  Allergies  Social History:   Social History   Socioeconomic History   Marital status: Married    Spouse name: Not on file   Number of children: 1   Years of education: Not on file   Highest education level: Not on file  Occupational History   Not on file  Tobacco Use   Smoking status: Never   Smokeless tobacco: Never  Vaping Use   Vaping status: Never Used  Substance and Sexual Activity   Alcohol use:  Never   Drug use: Never   Sexual activity: Not on file  Other Topics Concern   Not on file  Social History Narrative   Not on file   Social Drivers of Health   Financial Resource Strain: Not on file  Food Insecurity: No Food Insecurity (06/27/2023)   Hunger Vital Sign    Worried About Running Out of Food in the Last Year: Never true    Ran Out of Food in the Last Year: Never true  Transportation Needs: No Transportation Needs (06/27/2023)   PRAPARE - Administrator, Civil Service (Medical): No    Lack of Transportation (Non-Medical): No  Physical Activity: Not on file  Stress: Not on file  Social Connections: Moderately Isolated (06/27/2023)   Social Connection and Isolation Panel [NHANES]    Frequency of Communication with Friends and Family: More than three times a week    Frequency of Social Gatherings with Friends and Family: More than three times a week    Attends Religious Services: Never    Database administrator or Organizations: No    Attends Banker Meetings: Never    Marital Status: Married  Catering manager Violence: Not At Risk (06/27/2023)   Humiliation, Afraid, Rape, and Kick questionnaire    Fear of Current or Ex-Partner: No    Emotionally Abused: No    Physically Abused: No    Sexually Abused: No    Family History:    Family History  Problem Relation Age of Onset   Dementia Mother      ROS:  Please see the history of present illness.   All other ROS reviewed and negative.     Physical Exam/Data:   Vitals:   07/02/23 0845 07/02/23 0900 07/02/23 0915 07/02/23 0930  BP: 130/73 (!) 138/58 (!) 146/64 (!) 146/56  Pulse: 75 71 (!) 56 65  Resp: 17 18 15  (!) 32  Temp:      TempSrc:      SpO2: 100% 100% 100% 100%  Weight:      Height:        Intake/Output Summary (Last 24 hours) at 07/02/2023 0948 Last data filed at 07/02/2023 0800 Gross per 24 hour  Intake 779.86 ml  Output 2150 ml  Net -1370.14 ml      07/02/2023    5:30  AM 07/01/2023    5:00 AM 06/30/2023    7:45 AM  Last 3 Weights  Weight (lbs) 238 lb 15.7 oz 235 lb 3.7 oz 224 lb 3.3 oz  Weight (kg) 108.4 kg 106.7 kg 101.7 kg     Body mass index is 35.29 kg/m.  General:  Well nourished, well developed, in no acute distress HEENT: normal Neck: no JVD Vascular: No carotid bruits; Distal pulses 2+ bilaterally Cardiac:  normal S1, S2;  IRRR Lungs:  clear to auscultation bilaterally, no wheezing, rhonchi or rales  Abd: soft, nontender, no hepatomegaly  Ext: no edema Musculoskeletal:  No deformities, BUE and  BLE strength normal and equal Skin: warm and dry  Neuro:  CNs 2-12 intact, no focal abnormalities noted Psych:  Normal affect   EKG:  The EKG was personally reviewed and demonstrates:  atrial fibrillation  Telemetry:  Telemetry was personally reviewed and demonstrates:  afib with slow response to 40s, not sustained  Relevant CV Studies:  Echocardiogram 10/11/2021:  Normal LV systolic function with visual EF 55-60%. Left ventricle cavity  is normal in size. Moderate left ventricular hypertrophy. Normal global  wall motion. Normal diastolic filling pattern, normal LAP.  Aortic valve sclerosis without stenosis.  No significant valvular heart disease.  No prior study for comparison.   Laboratory Data:  High Sensitivity Troponin:  No results for input(s): "TROPONINIHS" in the last 720 hours.   Chemistry Recent Labs  Lab 06/30/23 0305 06/30/23 1519 07/01/23 0718 07/02/23 0748 07/02/23 0749  NA 134* 133* 133*  --  128*  K 3.8 3.9 3.6  --  3.3*  CL 101 103 103  --  97*  CO2 26 25 22   --  23  GLUCOSE 133* 183* 131*  --  114*  BUN 13 11 14   --  22  CREATININE 1.81* 1.47* 2.16*  --  2.97*  CALCIUM 7.7* 7.4* 7.8*  --  7.5*  MG 2.2  --  2.1 2.0  --   GFRNONAA 40* 51* 32*  --  22*  ANIONGAP 7 5 8   --  8    Recent Labs  Lab 06/30/23 1519 07/01/23 0718 07/02/23 0749  ALBUMIN 2.4* 2.1* 2.0*   Lipids No results for input(s): "CHOL",  "TRIG", "HDL", "LABVLDL", "LDLCALC", "CHOLHDL" in the last 168 hours.  Hematology Recent Labs  Lab 06/30/23 0305 07/01/23 0718 07/02/23 0748  WBC 21.1* 22.4* 23.5*  RBC 2.98* 2.94* 2.91*  HGB 8.0* 7.9* 7.9*  HCT 27.3* 27.2* 26.1*  MCV 91.6 92.5 89.7  MCH 26.8 26.9 27.1  MCHC 29.3* 29.0* 30.3  RDW 14.8 14.9 15.1  PLT 281 262 271   Thyroid No results for input(s): "TSH", "FREET4" in the last 168 hours.  BNPNo results for input(s): "BNP", "PROBNP" in the last 168 hours.  DDimer No results for input(s): "DDIMER" in the last 168 hours.   Radiology/Studies:  DG Abd 1 View Result Date: 07/01/2023 CLINICAL DATA:  C diff colitis EXAM: ABDOMEN - 1 VIEW COMPARISON:  CT 06/26/2023 noncontrast FINDINGS: Indwelling percutaneous left-sided nephrostomy tube with stones overlying the left kidney. Right-sided stone also suggested. There is gas seen in nondilated loops of small large bowel. There is some air-filled loops of transverse colon with some thumbprinting, fold thickening. Air in the stomach. Elevated right hemidiaphragm. No obvious free air on these supine radiographs. Degenerative changes along the spine and pelvis. IMPRESSION: Nondilated bowel gas pattern. Persistent areas of fold thickening along the transverse colon. Electronically Signed   By: Karen Kays M.D.   On: 07/01/2023 14:41     Assessment and Plan:   Paroxysmal atrial fibrillation He has atrial fibrillation with slow response.  This is a stable rhythm. -Avoid AV nodal blocking agents -If his rates slow to 20 or less or has significant pauses can start dopamine drip -K>4, Mg>2 -Continue Eliquis -Otherwise can manage underlying issues including acute renal failure with hydronephrosis and C. Difficile.   Cardiology will follow peripherally and if rates are stable we will sign off.  Risk Assessment/Risk Scores:       For questions or updates, please contact [facility] Please consult [email or web address] for  contact  info under    Maisie Fus 07/02/2023

## 2023-07-02 NOTE — Plan of Care (Signed)
  Problem: Coping: Goal: Ability to adjust to condition or change in health will improve Outcome: Progressing   Problem: Health Behavior/Discharge Planning: Goal: Ability to manage health-related needs will improve Outcome: Progressing   Problem: Metabolic: Goal: Ability to maintain appropriate glucose levels will improve Outcome: Progressing   Problem: Nutritional: Goal: Maintenance of adequate nutrition will improve Outcome: Progressing   Problem: Tissue Perfusion: Goal: Adequacy of tissue perfusion will improve Outcome: Progressing   Problem: Skin Integrity: Goal: Risk for impaired skin integrity will decrease Outcome: Progressing   Problem: Activity: Goal: Risk for activity intolerance will decrease Outcome: Not Progressing   Problem: Nutrition: Goal: Adequate nutrition will be maintained Outcome: Not Progressing

## 2023-07-02 NOTE — Progress Notes (Signed)
 [facility] Progress Note  Subjective:  He had 1.8 liters UOP over 2/26 as well as additional 500 mL UOP charted from urostomy and one unmeasured urine void.  He had an intermittent cath yesterday per charting and states that today he asked for a foley.  Spoke with his wife at bedside.  Review of systems:     Denies shortness of breath or chest pain Denies n/v Abdominal pain   Vitals:   07/02/23 1300 07/02/23 1400 07/02/23 1500 07/02/23 1600  BP: (!) 155/72 (!) 149/66 (!) 143/60   Pulse: 73 72 76   Resp: (!) 8 20 (!) 27   Temp:    98.3 F (36.8 C)  TempSrc:    Oral  SpO2: 100% 100% 98%   Weight:      Height:        Physical Exam:    General adult male in bed in no acute distress HEENT normocephalic atraumatic extraocular movements intact sclera anicteric Neck supple trachea midline Lungs clear to auscultation bilaterally normal work of breathing at rest on room air  Heart S1S2 no rub Abdomen soft nontender nondistended Extremities 1- 2+ edema lower extremities; left arm swelling as well  Psych normal mood and affect Neuro alert and oriented x 3 provides hx and follows commands Access left internal jugular nontunneled dialysis catheter     Renal-related home meds: - norvasc 5 - olmesartan 20 every day - fosrenol 750 mg ac tid - renvela 800 ac tid - others: insulin, eliquis       Date                             Creat               eGFR (ml/min) 04/18/2021                  1.87                 39 06/25/21                        2.00                 36 ml/min 07/2021                         1.73                 42 09/2021                         2.17                 32 12/2021                         2.51                 27 01/2022                         2.48                 28 ml/min, CKD 4 05/30/23- 06/05/23 4.43- 5.34 11- 14 ml/min 06/26/23                        10.8  5 ml/min   UA - prot > 300, mod Hb, large LE, 11-20 rbcs > 50 wbc, 0-5  epi UNa 114, UCr 20 CT renal stone 2/21 - Adrenals/Urinary Tract: Adrenal glands appear normal. Bilateral nephrolithiasis is noted. Percutaneous left nephrostomy is noted. No hydronephrosis or renal obstruction is noted. Urinary bladder is unremarkable.  CXR 2/21 - IMPRESSION: 1. No acute intrathoracic process.       Assessment/ Plan: AKI on CKD 4 - b/l creat 4.5- 5.3 from jan 2025, eGFR 11-14 ml/min. Creat here was 10.8 in the setting of hypotension, new dx of Cdif (1 wk diarrhea, n/v), swollen legs and SOB. Rec'd 3 liters fluids in ED and did not look SOB, however due to his leg swelling no more boluses were given and they started levophed to treat the hypotension. UA wbc > rbc, prot > 300. CT w/o obstruction, has L neph tube. Suspect AKI is ischemic and pre-renal insults related to hypotension +/- ARB and intravasc vol depletion.  Creat did not improve and due to sig azotemia and metabolic acidosis, CRRT was started on 2/22.  Stopped 2/25 Assess dialysis needs daily.  No acute indication for renal replacement therapy  Discussed with team - he is to be transferred to [facility] once stable for the floor Continue foley catheter Metabolic acidosis -improved with CRRT Shock/ hypotension - possibly related to colitis/ Cdif. IV abx per CCM. Off of pressors   L nephrostomy tube - placed in Jan 2025 by IR, and is f/b urology. Has staghorn calculi on the L and is supposed to have stone removed and stent placed per his wife on March 7th at [facility].  Urinary retention - continue foley catheter Chronic atrial fib - on eliquis; per primary team  Hypophosphatemia - from CRRT - improved with repletion Bradycardia - CCM has consulted cardiology. HR improved today.   Hypokalemia - replete with 20 meq potassium po once    Disposition - continue inpatient monitoring.  Currently in ICU      Recent Labs  Lab 07/01/23 0718 07/02/23 0748 07/02/23 0749  HGB 7.9* 7.9*  --   ALBUMIN 2.1*  --  2.0*  CALCIUM  7.8*  --  7.5*  PHOS 2.7  --  3.5  CREATININE 2.16*  --  2.97*  K 3.6  --  3.3*   No results for input(s): "IRON", "TIBC", "FERRITIN" in the last 168 hours. Inpatient medications:  apixaban  2.5 mg Oral BID   Chlorhexidine Gluconate Cloth  6 each Topical Daily   feeding supplement  237 mL Oral BID BM   insulin aspart  0-9 Units Subcutaneous TID WC   midodrine  10 mg Oral TID WC   multivitamin  1 tablet Oral QHS   vancomycin  500 mg Oral Q6H    metronidazole Stopped (07/02/23 1441)   norepinephrine (LEVOPHED) Adult infusion Stopped (07/02/23 0815)   acetaminophen, heparin, HYDROmorphone (DILAUDID) injection, menthol-cetylpyridinium, ondansetron (ZOFRAN) IV, mouth rinse, oxyCODONE, simethicone, tiZANidine, traZODone   Estanislado Emms, MD 5:46 PM] 07/02/2023

## 2023-07-02 NOTE — Progress Notes (Signed)
 NAME:  Calvin Schultz, MRN:  [id number], DOB:  1950-10-16, LOS: 6 ADMISSION DATE:  06/26/2023, CONSULTATION DATE: 2/21 REFERRING MD: Dr. Anitra Lauth, CHIEF COMPLAINT: Renal failure, hypotension  History of Present Illness:  73 year old male with past medical history as below, which is significant for atrial fibrillation on Eliquis, hypertension, diabetes mellitus, CKD, and recently admitted for acute on chronic renal failure with left-sided hydronephrosis secondary to several calculi in the left renal collecting system with possible staghorn calculus concerning for left UPJ stricture.  Nephrostomy tube was placed on the left and the patient was discharged with the nephrostomy tube as well as ongoing antibiotics transitioned from ceftriaxone to Bactrim p.o.  Also treated for lower extremity edema during that admission with aggressive IV Lasix diuresis.  Initial post discharge course was unremarkable, but ultimately the patient began to experience worsening weakness associated with nausea, vomiting, and diarrhea as well as left lower quadrant pain.  He has some difficulty walking at baseline due to knee pain but as a result of the mentioned symptoms he became unable to walk at all due to weakness.  This is what his family called EMS and he was transported to [facility] emergency department on 2/21.  Upon arrival to the emergency department he was noted to be hypotensive and IV fluid resuscitation was initiated.  Initially blood pressure did respond to IV fluids, however, he was ultimately required to be started on norepinephrine infusion.  Workup in the ED included CT scan which was concerning for colitis of the descending and sigmoid colon.  C. difficile antigen and toxin positive.  He was started on appropriate antibiotics.  PCCM was asked to evaluate in the setting of hypotension.  Pertinent  Medical History   has a past medical history of Arthritis, CKD (chronic kidney disease) stage 3, GFR 30-59 ml/min  (HCC), Diabetes mellitus without complication (HCC), Hyperlipidemia, and Hypertension.  Significant Hospital Events: Including procedures, antibiotic start and stop dates in addition to other pertinent events   1/31-2/5 admit to [facility] for acute on chronic renal failure. Left UJP stone, hydro. Nephrostomy tube placed.  2/21 admitted to [facility] with shock, dehydration, and acute renal failure.  2/22 started CRRT 2/23 Critically ill, remains on 10 mics of Levophed. Tolerating CRRT  2/24 Remains on CRRT with ongoing ABD pain 2/25 CRRT stopped, 2/26 bradycardic episodes to 30s on telemetry  Interim History / Subjective:   Much improved Off pressors Required straight cath yesterday for 1 L urine retention Stool output decreasing  Objective   Blood pressure (!) 146/56, pulse 65, temperature 97.8 F (36.6 C), temperature source Oral, resp. rate (!) 32, height 5\' 9"  (1.753 m), weight 108.4 kg, SpO2 100%.        Intake/Output Summary (Last 24 hours) at 07/02/2023 0941 Last data filed at 07/02/2023 0800 Gross per 24 hour  Intake 779.86 ml  Output 2150 ml  Net -1370.14 ml   Filed Weights   06/30/23 0745 07/01/23 0500 07/02/23 0530  Weight: 101.7 kg 106.7 kg 108.4 kg    Examination: General: Acute on chronically ill appearing elderly male, no distress on CRRT HEENT: ETT, MM pink/moist, PERRL,  Neuro: Alert, interactive, non-focal  CV: s1s2 irregular, rate controlled PULM:  Clear to auscultation, no increased work of breathing, GI: soft, bowel sounds+in all 4 quadrants, non-tender, non-distended, tolerating oral diet  Extremities: warm/dry, no edema  Skin: no rashes or lesions  Labs show  hyponatremia, hypokalemia, creatinine rising to 2.9, persistent leukocytosis, stable anemia  Resolved Hospital Problem  list     Assessment & Plan:   Septic shock -in the setting of C-diff colitis, blood cultures remain negative x3 days  P: Off pressors currently, we can stay off Continue  midodrine    AKI superimposed on CKD stage IV -baseline creatinine 4.9 Status post left nephrostomy  -Nephrostomy tube placed 1/29 by IR with plan for ureteric stent placement 3/21-has staghorn calculi on left P: Nephrology following appreciate assistance  CRRT stopped, may need IHD at some point although urine output reassuring for renal recovery. If again as urinary retention may need Foley Trend Bmet , expect creatinine to rise to baseline 4.5 range Avoid nephrotoxins Zanaflex for leg cramps   C. difficile colitis -He does not seem to have been on long-term antibiotics, complains of diarrhea for a year X-ray abdomen shows thumbprinting of transverse colon, no evidence of bowel obstruction on CT 2/21 P: Continue oral vancomycin and IV Flagyl  Consider fidaxomicin   Chronic atrial fibrillation with episodes of bradycardia?  Sick sinus - On Eliquis at baseline  P: Continue home Eliquis  Cardiology consult  Diabetes type 2  -may have lower insulin requirements P: SSI  CBG goal 140-180 CBG ACHS  If he remains off pressors, he can transfer to telemetry at [facility] for intermittent hemodialysis versus renal recovery  Best Practice (right click and "Reselect all SmartList Selections" daily)   Diet/type: Regular consistency (see orders) DVT prophylaxis DOAC Pressure ulcer(s): N/A GI prophylaxis: N/A Lines: N/A Foley:  N/A Code Status:  full code Last date of multidisciplinary goals of care discussion [patient and spouse updated at bedside]  Critical care time: NA   Performed by: Comer Locket Jodene Nam MD. Tonny Bollman. [facility] Pager : [phone]  If no response to pager , please call [phone] until 7 pm After 7:00 pm call Elink  [phone]    07/02/2023, 9:41 AM

## 2023-07-02 NOTE — Plan of Care (Signed)
  Problem: Coping: Goal: Ability to adjust to condition or change in health will improve Outcome: Progressing   Problem: Fluid Volume: Goal: Ability to maintain a balanced intake and output will improve Outcome: Progressing   Problem: Health Behavior/Discharge Planning: Goal: Ability to manage health-related needs will improve Outcome: Progressing   Problem: Clinical Measurements: Goal: Ability to maintain clinical measurements within normal limits will improve Outcome: Progressing Goal: Diagnostic test results will improve Outcome: Progressing   Problem: Coping: Goal: Level of anxiety will decrease Outcome: Progressing   Problem: Pain Managment: Goal: General experience of comfort will improve and/or be controlled Outcome: Progressing

## 2023-07-02 NOTE — Progress Notes (Signed)
 PT Cancellation Note  Patient Details Name: Calvin Schultz MRN: [id number] DOB: [birth year]   Cancelled Treatment:    Reason Eval/Treat Not Completed: Medical issues which prohibited therapy Patient is not feeling well at this time, will check back tomorrow. Blanchard Kelch PT Acute Rehabilitation Services Office [phone]   Rada Hay 07/02/2023, 2:35 PM

## 2023-07-03 DIAGNOSIS — A0472 Enterocolitis due to Clostridium difficile, not specified as recurrent: Principal | ICD-10-CM

## 2023-07-03 LAB — CBC WITH DIFFERENTIAL/PLATELET
Abs Immature Granulocytes: 0.29 10*3/uL — ABNORMAL HIGH (ref 0.00–0.07)
Basophils Absolute: 0.1 10*3/uL (ref 0.0–0.1)
Basophils Relative: 0 %
Eosinophils Absolute: 0.2 10*3/uL (ref 0.0–0.5)
Eosinophils Relative: 1 %
HCT: 23.9 % — ABNORMAL LOW (ref 39.0–52.0)
Hemoglobin: 7 g/dL — ABNORMAL LOW (ref 13.0–17.0)
Immature Granulocytes: 1 %
Lymphocytes Relative: 6 %
Lymphs Abs: 1.3 10*3/uL (ref 0.7–4.0)
MCH: 26.6 pg (ref 26.0–34.0)
MCHC: 29.3 g/dL — ABNORMAL LOW (ref 30.0–36.0)
MCV: 90.9 fL (ref 80.0–100.0)
Monocytes Absolute: 2 10*3/uL — ABNORMAL HIGH (ref 0.1–1.0)
Monocytes Relative: 9 %
Neutro Abs: 18.7 10*3/uL — ABNORMAL HIGH (ref 1.7–7.7)
Neutrophils Relative %: 83 %
Platelets: 228 10*3/uL (ref 150–400)
RBC: 2.63 MIL/uL — ABNORMAL LOW (ref 4.22–5.81)
RDW: 15.3 % (ref 11.5–15.5)
WBC: 22.5 10*3/uL — ABNORMAL HIGH (ref 4.0–10.5)
nRBC: 0 % (ref 0.0–0.2)

## 2023-07-03 LAB — IRON AND TIBC
Iron: 35 ug/dL — ABNORMAL LOW (ref 45–182)
Saturation Ratios: 20 % (ref 17.9–39.5)
TIBC: 179 ug/dL — ABNORMAL LOW (ref 250–450)
UIBC: 144 ug/dL

## 2023-07-03 LAB — RENAL FUNCTION PANEL
Albumin: 1.7 g/dL — ABNORMAL LOW (ref 3.5–5.0)
Anion gap: 6 (ref 5–15)
BUN: 24 mg/dL — ABNORMAL HIGH (ref 8–23)
CO2: 24 mmol/L (ref 22–32)
Calcium: 7.8 mg/dL — ABNORMAL LOW (ref 8.9–10.3)
Chloride: 102 mmol/L (ref 98–111)
Creatinine, Ser: 3.5 mg/dL — ABNORMAL HIGH (ref 0.61–1.24)
GFR, Estimated: 18 mL/min — ABNORMAL LOW (ref >60)
Glucose, Bld: 124 mg/dL — ABNORMAL HIGH (ref 70–99)
Phosphorus: 4.2 mg/dL (ref 2.5–4.6)
Potassium: 3.7 mmol/L (ref 3.5–5.1)
Sodium: 132 mmol/L — ABNORMAL LOW (ref 135–145)

## 2023-07-03 LAB — FERRITIN: Ferritin: 88 ng/mL (ref 24–336)

## 2023-07-03 LAB — GLUCOSE, CAPILLARY
Glucose-Capillary: 101 mg/dL — ABNORMAL HIGH (ref 70–99)
Glucose-Capillary: 163 mg/dL — ABNORMAL HIGH (ref 70–99)
Glucose-Capillary: 144 mg/dL — ABNORMAL HIGH (ref 70–99)
Glucose-Capillary: 182 mg/dL — ABNORMAL HIGH (ref 70–99)
Glucose-Capillary: 157 mg/dL — ABNORMAL HIGH (ref 70–99)

## 2023-07-03 LAB — MAGNESIUM: Magnesium: 1.9 mg/dL (ref 1.7–2.4)

## 2023-07-03 MED ORDER — SODIUM CHLORIDE 0.9% FLUSH: 10.0000 mL | INTRAVENOUS | Status: DC | PRN

## 2023-07-03 MED ORDER — DARBEPOETIN ALFA 40 MCG/0.4ML IJ SOSY
40.0000 ug | PREFILLED_SYRINGE | INTRAMUSCULAR | Status: DC
  Administered 2023-07-03: 40 ug via SUBCUTANEOUS
  Filled 2023-07-03: qty 0.4

## 2023-07-03 MED ORDER — FUROSEMIDE 10 MG/ML IJ SOLN
80.0000 mg | Freq: Once | INTRAMUSCULAR | Status: AC
  Administered 2023-07-03: 80 mg via INTRAVENOUS
  Filled 2023-07-03: qty 8

## 2023-07-03 NOTE — Plan of Care (Signed)

## 2023-07-03 NOTE — Progress Notes (Addendum)
 [facility] Progress Note  Subjective:  He had 1.2 liters UOP over 2/27.  He was transferred from [facility] to [place].  He states that he does not have a kidney doctor; does follow with urology.  His wife is on the phone with her employer about getting leave.   Review of systems:    Denies shortness of breath or chest pain Had nausea last night when he got hot and after the ambulance ride - improved now    Vitals:   07/02/23 1957 07/03/23 0512 07/03/23 0700 07/03/23 0739  BP: (!) 150/83 (!) 116/59  126/80  Pulse: 73 70  72  Resp: 20 18    Temp: 98.5 F (36.9 C) (!) 97.5 F (36.4 C)  97.9 F (36.6 C)  TempSrc:    Oral  SpO2: 100% 99%  94%  Weight:   105.2 kg   Height:        Physical Exam:    General adult male in bed in no acute distress HEENT normocephalic atraumatic extraocular movements intact sclera anicteric Neck supple trachea midline Lungs clear to auscultation bilaterally normal work of breathing at rest on room air  Heart S1S2 no rub Abdomen soft nontender nondistended Extremities 1- 2+ edema lower extremities; left arm swelling as well  Psych normal mood and affect Neuro alert and oriented x 3 provides hx and follows commands Access left internal jugular nontunneled dialysis catheter     Renal-related home meds: - norvasc 5 - olmesartan 20 every day - fosrenol 750 mg ac tid - renvela 800 ac tid - others: insulin, eliquis       Date                             Creat               eGFR (ml/min) 04/18/2021                  1.87                 39 06/25/21                        2.00                 36 ml/min 07/2021                         1.73                 42 09/2021                         2.17                 32 12/2021                         2.51                 27 01/2022                         2.48                 28 ml/min, CKD 4 05/30/23- 06/05/23 4.43- 5.34 11- 14 ml/min 06/26/23  10.8                 5 ml/min    UA - prot > 300, mod Hb, large LE, 11-20 rbcs > 50 wbc, 0-5 epi UNa 114, UCr 20 CT renal stone 2/21 - Adrenals/Urinary Tract: Adrenal glands appear normal. Bilateral nephrolithiasis is noted. Percutaneous left nephrostomy is noted. No hydronephrosis or renal obstruction is noted. Urinary bladder is unremarkable.  CXR 2/21 - IMPRESSION: 1. No acute intrathoracic process.       Assessment/ Plan: AKI on CKD 4 - AKI is ischemic and pre-renal insults related to hypotension +/- ARB and intravasc vol depletion.  CT w/o obstruction, has L neph tube.  He was on CRRT from 2/22 - 2/25.  Note that Cr 4.06- 5.3 from jan 2025, eGFR 11-15 ml/min.  For reference in 01/2022 with Cr 2.48 from Care Everywhere No emergent indication for hemodialysis.  Assess dialysis needs daily.  Anticipate HD on 3/1.  Continue nontunneled catheter and if needs HD tomorrow will need to plan for tunneled catheter early next week  Lasix 80 mg IV once now  Continue foley catheter Metabolic acidosis -improved with CRRT Shock/ hypotension - possibly related to colitis/ Cdif. IV abx per CCM. Off of pressors   L nephrostomy tube - placed in Jan 2025 by IR, and is f/b urology. Has staghorn calculi on the L and is supposed to have stone removed and stent placed per his wife on March 7th at [facility].  Urinary retention - continue foley catheter Chronic atrial fib - on eliquis; per primary team  Hypophosphatemia secondary to CRRT - improved with repletion Bradycardia - CCM has consulted cardiology. HR improved today.   Hypokalemia - improved  Disposition - continue inpatient monitoring   Recent Labs  Lab 07/02/23 0748 07/02/23 0749 07/03/23 0408  HGB 7.9*  --  7.0*  ALBUMIN  --  2.0* 1.7*  CALCIUM  --  7.5* 7.8*  PHOS  --  3.5 4.2  CREATININE  --  2.97* 3.50*  K  --  3.3* 3.7   No results for input(s): "IRON", "TIBC", "FERRITIN" in the last 168 hours. Inpatient medications:  apixaban  2.5 mg Oral BID   Chlorhexidine  Gluconate Cloth  6 each Topical Daily   feeding supplement  237 mL Oral BID BM   insulin aspart  0-9 Units Subcutaneous TID WC   midodrine  10 mg Oral TID WC   multivitamin  1 tablet Oral QHS   vancomycin  500 mg Oral Q6H    metronidazole 500 mg (07/03/23 1610)   acetaminophen, heparin, HYDROmorphone (DILAUDID) injection, menthol-cetylpyridinium, ondansetron (ZOFRAN) IV, mouth rinse, oxyCODONE, simethicone, sodium chloride flush, tiZANidine, traZODone   Estanislado Emms, MD 12:30 PM 07/03/2023   Normocytic Anemia   Add on iron panel Anticipate need for PRBC's soon  Spoke with pt and his wife.  Discussed risks/benefits indications for ESA and he does consent for ESA.  No known cancer   Start aranesp 40 mcg weekly.   Discussed other plans with his wife as well   Estanislado Emms, MD 07/03/2023 12:47 PM

## 2023-07-03 NOTE — TOC Progression Note (Signed)
 Transition of Care [facility]) - Progression Note    Patient Details  Name: Calvin Schultz MRN: [id number] Date of Birth: [birth year]  Transition of Care [facility]) CM/SW Contact  Marliss Coots, LCSW Phone Number: 07/03/2023, 12:08 PM  Clinical Narrative:     12:08 PM Per chart review, CIR is following patient to assess for admit candidacy.  Expected Discharge Plan: IP Rehab Facility Barriers to Discharge: Continued Medical Work up  Expected Discharge Plan and Services   Discharge Planning Services: CM Consult Post Acute Care Choice: IP Rehab Living arrangements for the past 2 months: Single Family Home                                       Social Determinants of Health (SDOH) Interventions SDOH Screenings   Food Insecurity: No Food Insecurity (06/27/2023)  Housing: Low Risk  (06/27/2023)  Transportation Needs: No Transportation Needs (06/27/2023)  Utilities: Not At Risk (06/27/2023)  Social Connections: Moderately Isolated (06/27/2023)  Tobacco Use: Low Risk  (06/27/2023)    Readmission Risk Interventions    06/27/2023    1:00 PM  Readmission Risk Prevention Plan  Transportation Screening Complete  PCP or Specialist Appt within 5-7 Days Complete  Home Care Screening Complete  Medication Review (RN CM) Complete

## 2023-07-03 NOTE — Progress Notes (Signed)
  Inpatient Rehab Admissions Coordinator :  Per therapy recommendations, patient was screened for CIR candidacy by Ottie Glazier RN MSN.  At this time patient appears to be a potential candidate for CIR.  Please place rehab consult if you would like [place] to assess for candidacy to admit to CIR. Please advise. Please call me with any questions.  Ottie Glazier RN MSN Admissions Coordinator [phone]

## 2023-07-03 NOTE — Progress Notes (Signed)
 Progress Note   Patient: Calvin Schultz [id number] DOB: [birth year] DOA: 06/26/2023     7 DOS: the patient was seen and examined on 07/03/2023   Brief hospital course: 73 year old man with PMH of atrial fibrillation on Eliquis, hypertension, diabetes mellitus, CKD, and recently admitted for acute on chronic renal failure with left-sided hydronephrosis secondary to several calculi in the left renal collecting system with possible staghorn calculus concerning for left UPJ stricture. Nephrostomy tube was placed on the left and the patient was discharged with the nephrostomy tube as well as ongoing antibiotics transitioned from ceftriaxone to Bactrim p.o. Also treated for lower extremity edema during that admission with aggressive IV Lasix diuresis.   Initial post discharge course was unremarkable, but ultimately the patient began to experience worsening weakness associated with nausea, vomiting, and diarrhea as well as left lower quadrant pain. Presented to [facility] emergency department on 2/21. Upon arrival to the emergency department he was noted to be hypotensive and IV fluid resuscitation was initiated. Initially blood pressure did respond to IV fluids, however, he was ultimately required pressors. Workup in the ED included CT scan which was concerning for colitis of the descending and sigmoid colon. C. difficile antigen and toxin positive. He was started on appropriate antibiotics.   He was started on CRRT on 2/22 and it was discontinued on 2/25.  He was noted to be bradycardic on 2/26 and was transferred to [facility] for evaluation by cardiology and further management.  Assessment and Plan:  Septic shock Due to C-diff colitis Blood cultures were negative.  Weaned off pressors. - Continue midodrine    C. difficile colitis He does not seem to have been on long-term antibiotics, complains of diarrhea for a year X-ray abdomen showed thumbprinting of transverse colon, no evidence of bowel  obstruction on CT 2/21 Tested positive for C diff on 06/26/23. Started on treatment. Diarrhea is ongoing, with rectal tube in place.  -Continue oral vancomycin and IV Flagyl   AKI superimposed on CKD stage IV Baseline creatinine 4.9 Nephrology following.  Patient was on CRRT prior to transfer to [facility].   Status post left nephrostomy  Nephrostomy tube placed 1/29 by IR with plan for ureteric stent placement 3/21-has staghorn calculi on left Nephrology following, appreciate assistance  CRRT stopped, may need IHD at some point although urine output reassuring for renal recovery. - Will monitor urine output, creatinine.     Chronic atrial fibrillation with episodes of bradycardia Seen by cardiology. Cardiology states patient has atrial fibrillation with slow response.  This is a stable rhythm. Per cardiology, -Avoid AV nodal blocking agents -If his rates slow to 20 or less or has significant pauses can start dopamine drip -K>4, Mg>2 -Continue Eliquis  T2DM On levemir insulin at home.  - Will continue diabetic diet.  - SSI.        Subjective: Patient is feeling better. He worked with PT and was sitting up having a meal during rounds.   Physical Exam: Vitals:   07/03/23 0512 07/03/23 0700 07/03/23 0739 07/03/23 1338  BP: (!) 116/59  126/80 (!) 104/48  Pulse: 70  72   Resp: 18     Temp: (!) 97.5 F (36.4 C)  97.9 F (36.6 C)   TempSrc:   Oral   SpO2: 99%  94%   Weight:  105.2 kg    Height:        General: Alert, oriented X3  Eyes: Pupils equal, reactive  Oral cavity: moist  mucous membranes  Head: Atraumatic, normocephalic  Neck: supple  Chest: clear to auscultation. No crackles, no wheezes  CVS: S1,S2 RRR. No murmurs  Abd: No distention, soft, non-tender. No masses palpable. L nephrostomy tube Extr: No edema   MSK: No joint deformities or swelling  Neurological: Grossly intact.   Other: Rectal tube and Foley catheter in place.   Data Reviewed:       Latest Ref Rng & Units 07/03/2023    4:08 AM 07/02/2023    7:48 AM 07/01/2023    7:18 AM  CBC  WBC 4.0 - 10.5 K/uL 22.5  23.5  22.4   Hemoglobin 13.0 - 17.0 g/dL 7.0  7.9  7.9   Hematocrit 39.0 - 52.0 % 23.9  26.1  27.2   Platelets 150 - 400 K/uL 228  271  262       Latest Ref Rng & Units 07/03/2023    4:08 AM 07/02/2023    7:49 AM 07/01/2023    7:18 AM  BMP  Glucose 70 - 99 mg/dL 295  284  132   BUN 8 - 23 mg/dL 24  22  14    Creatinine 0.61 - 1.24 mg/dL 4.40  1.02  7.25   Sodium 135 - 145 mmol/L 132  128  133   Potassium 3.5 - 5.1 mmol/L 3.7  3.3  3.6   Chloride 98 - 111 mmol/L 102  97  103   CO2 22 - 32 mmol/L 24  23  22    Calcium 8.9 - 10.3 mg/dL 7.8  7.5  7.8     Family Communication: Spoke with patient and wife at bedside.   Disposition: Status is: Inpatient Remains inpatient appropriate because: Remains on IV antibiotics, requiring renal replacement therapy  Planned Discharge Destination: Home DVT ppx: Systemic anticoagulation with Eliquis.     Time spent: 40 minutes  Author: Marcine Matar, MD 07/03/2023 3:29 PM  For on call review [email or web address].

## 2023-07-03 NOTE — Evaluation (Signed)
 Physical Therapy Evaluation Patient Details Name: Calvin Schultz MRN: [id number] DOB: [birth year] Today's Date: 07/03/2023  History of Present Illness  Pt is a 73 y/o M admitted on 06/26/23 after presenting with c/o worsening weakness, N&V & diarrhea, & LLQ pain. Pt found to be hypotensive with workup concerning for colitis, c. Difficile antigen & toxin positive. (Of note, pt with recent admission for acute on chronic renal failure with L sided hydronephrosis 2/2 several calculi in the L renal collecting system with possible staghorn calculus concerning for L UPJ stricture; nephrostomy tube placed & pt d/c. 06/05/23-06/10/23) PMH: a-fib on Eliquis, HTN, DM, CKD 3, arthritis, HLD  Clinical Impression  Pt seen for PT evaluation with pt agreeable to tx, wife present for session. Prior to admission pt was ambulatory mod I with SPC, required assistance from wife for sit<>stand transfers if not using his lift chair, denies falls. On this date, pt presents with generalized edema & weakness. Pt requires mod assist for supine>sit, mod assist +2 for STS & min assist +2 for step pivot to recliner with RW. Pt is limited by hx of BLE arthritis & decreased ability to flex knees to place underneath BOS to increase ease of STS. Pt is motivated to participate & eager to return to PLOF. Wife reports she plans to take leave of absence to assist pt at d/c. Will continue to follow pt acutely to progress mobility as able; recommend d/c to post acute rehab >3 hours therapy/day to maximize independence & reduce caregiver burden prior to d/c home.        If plan is discharge home, recommend the following: Two people to help with walking and/or transfers;Two people to help with bathing/dressing/bathroom;Help with stairs or ramp for entrance;Assist for transportation;Assistance with feeding;Direct supervision/assist for financial management;Assistance with cooking/housework   Can travel by private vehicle        Equipment  Recommendations Other (comment) (defer to next venue)  Recommendations for Other Services  Rehab consult;OT consult    Functional Status Assessment Patient has had a recent decline in their functional status and demonstrates the ability to make significant improvements in function in a reasonable and predictable amount of time.     Precautions / Restrictions Precautions Precautions: Fall Precaution/Restrictions Comments: L nephrostomy tube, foley catheter, rectal tube Restrictions Weight Bearing Restrictions Per Provider Order: No      Mobility  Bed Mobility Overal bed mobility: Needs Assistance Bed Mobility: Supine to Sit     Supine to sit: Mod assist, Used rails, HOB elevated     General bed mobility comments: Pt able to bring legs off EOB, requires cuing & assistance to upright trunk even with HOB elevated, assistance to scoot buttocks to EOB.    Transfers Overall transfer level: Needs assistance Equipment used: Rolling walker (2 wheels) Transfers: Sit to/from Stand, Bed to chair/wheelchair/BSC Sit to Stand: Mod assist, +2 physical assistance, +2 safety/equipment, From elevated surface   Step pivot transfers: Min assist, +2 physical assistance (significantly extra time, assistance/cuing for RW management/turning RW, cuing re: sequencing.)       General transfer comment: PT provides cuing re: hand placement (push to standing), significantly elevated EOB, pt with limited ability to move BLE underneath BOS 2/2 limited knee flexion. Pt requires mod assist +2 for STS to power up.    Ambulation/Gait                  Careers information officer  Tilt Bed    Modified Rankin (Stroke Patients Only)       Balance Overall balance assessment: Needs assistance Sitting-balance support: No upper extremity supported, Feet supported Sitting balance-Leahy Scale: Fair Sitting balance - Comments: supervision static sitting   Standing balance  support: Reliant on assistive device for balance, During functional activity, Bilateral upper extremity supported Standing balance-Leahy Scale: Poor                               Pertinent Vitals/Pain Pain Assessment Pain Assessment: Faces Faces Pain Scale: Hurts little more Pain Location: buttocks Pain Descriptors / Indicators: Discomfort Pain Intervention(s): Monitored during session, Repositioned    Home Living Family/patient expects to be discharged to:: Private residence Living Arrangements: Spouse/significant other Available Help at Discharge: Family;Available PRN/intermittently (wife & daughter work during the day but wife planning to take leave of absence to care for pt) Type of Home: House Home Access: Ramped entrance       Home Layout: Two level;Able to live on main level with bedroom/bathroom Home Equipment: Rolling Walker (2 wheels);Rollator (4 wheels);Cane - single point;BSC/3in1;Shower seat;Grab bars - toilet (equipment did/does belong to his daughter, pt using it as well)      Prior Function               Mobility Comments: Ambulatory with SPC, requires assistance for STS from standard seats in restaurants, otherwise always sits in lift chair at home & uses that to transfer STS 2/2 "bad" knees.       Extremity/Trunk Assessment   Upper Extremity Assessment Upper Extremity Assessment: Generalized weakness    Lower Extremity Assessment Lower Extremity Assessment: Generalized weakness (BLE flexion limited to ~75 degrees 2/2 hx of arthritis, significant BLE edema)    Cervical / Trunk Assessment Cervical / Trunk Assessment:  (entire body edema)  Communication   Communication Communication: No apparent difficulties    Cognition Arousal: Alert Behavior During Therapy: WFL for tasks assessed/performed   PT - Cognitive impairments: No apparent impairments                         Following commands: Intact       Cueing        General Comments      Exercises General Exercises - Lower Extremity Long Arc Quad: AROM, Seated, Strengthening, Both, 10 reps   Assessment/Plan    PT Assessment Patient needs continued PT services  PT Problem List Decreased strength;Pain;Cardiopulmonary status limiting activity;Decreased range of motion;Decreased activity tolerance;Decreased balance;Decreased mobility;Decreased knowledge of use of DME       PT Treatment Interventions DME instruction;Balance training;Modalities;Gait training;Neuromuscular re-education;Stair training;Cognitive remediation;Functional mobility training;Therapeutic activities;Therapeutic exercise;Patient/family education    PT Goals (Current goals can be found in the Care Plan section)  Acute Rehab PT Goals Patient Stated Goal: get stronger, return to PLOF PT Goal Formulation: With patient Time For Goal Achievement: 07/17/23 Potential to Achieve Goals: Good    Frequency Min 1X/week     Co-evaluation               AM-PAC PT "6 Clicks" Mobility  Outcome Measure Help needed turning from your back to your side while in a flat bed without using bedrails?: A Little Help needed moving from lying on your back to sitting on the side of a flat bed without using bedrails?: A Lot Help needed moving to and from a bed to a chair (  including a wheelchair)?: A Lot Help needed standing up from a chair using your arms (e.g., wheelchair or bedside chair)?: A Lot Help needed to walk in hospital room?: Total Help needed climbing 3-5 steps with a railing? : Total 6 Click Score: 11    End of Session   Activity Tolerance: Patient tolerated treatment well;Patient limited by fatigue Patient left: in chair;with chair alarm set;with call bell/phone within reach;with family/visitor present Nurse Communication: Mobility status PT Visit Diagnosis: Difficulty in walking, not elsewhere classified (R26.2);Other abnormalities of gait and mobility (R26.89);Muscle weakness  (generalized) (M62.81)    Time: 1308-6578 PT Time Calculation (min) (ACUTE ONLY): 31 min   Charges:   PT Evaluation $PT Eval Moderate Complexity: 1 Mod   PT General Charges $$ ACUTE PT VISIT: 1 Visit         Aleda Grana, PT, DPT 07/03/23, 11:35 AM   Sandi Mariscal 07/03/2023, 11:33 AM

## 2023-07-03 NOTE — Progress Notes (Signed)
 NEW ADMISSION NOTE New Admission Note:    Arrival Method: ED stretcher Mental Orientation: AAOx4 Telemetry: [phone] Assessment: Completed Skin: See flowsheet IV: RFA & LAC Pain: 9/10 Tubes: left nephrostomy, flexiseal, LHD cath Safety Measures: Safety Fall Prevention Plan has been given, discussed and signed Admission: Completed [facility] Orientation: Patient has been orientated to the room, unit and staff.  Family: wife at bedside   Orders have been reviewed and implemented. Will continue to monitor the patient. Call light has been placed within reach and bed alarm has been activated.

## 2023-07-04 LAB — CBC WITH DIFFERENTIAL/PLATELET
Abs Immature Granulocytes: 0.43 10*3/uL — ABNORMAL HIGH (ref 0.00–0.07)
Basophils Absolute: 0.1 10*3/uL (ref 0.0–0.1)
Basophils Relative: 0 %
Eosinophils Absolute: 0.4 10*3/uL (ref 0.0–0.5)
Eosinophils Relative: 2 %
HCT: 24.4 % — ABNORMAL LOW (ref 39.0–52.0)
Hemoglobin: 7.4 g/dL — ABNORMAL LOW (ref 13.0–17.0)
Immature Granulocytes: 2 %
Lymphocytes Relative: 6 %
Lymphs Abs: 1.5 10*3/uL (ref 0.7–4.0)
MCH: 27.1 pg (ref 26.0–34.0)
MCHC: 30.3 g/dL (ref 30.0–36.0)
MCV: 89.4 fL (ref 80.0–100.0)
Monocytes Absolute: 2.5 10*3/uL — ABNORMAL HIGH (ref 0.1–1.0)
Monocytes Relative: 11 %
Neutro Abs: 18.1 10*3/uL — ABNORMAL HIGH (ref 1.7–7.7)
Neutrophils Relative %: 79 %
Platelets: 266 10*3/uL (ref 150–400)
RBC: 2.73 MIL/uL — ABNORMAL LOW (ref 4.22–5.81)
RDW: 15.4 % (ref 11.5–15.5)
WBC: 23 10*3/uL — ABNORMAL HIGH (ref 4.0–10.5)
nRBC: 0 % (ref 0.0–0.2)

## 2023-07-04 LAB — RENAL FUNCTION PANEL
Albumin: 1.9 g/dL — ABNORMAL LOW (ref 3.5–5.0)
Anion gap: 8 (ref 5–15)
BUN: 29 mg/dL — ABNORMAL HIGH (ref 8–23)
CO2: 24 mmol/L (ref 22–32)
Calcium: 7.8 mg/dL — ABNORMAL LOW (ref 8.9–10.3)
Chloride: 99 mmol/L (ref 98–111)
Creatinine, Ser: 3.95 mg/dL — ABNORMAL HIGH (ref 0.61–1.24)
GFR, Estimated: 16 mL/min — ABNORMAL LOW (ref >60)
Glucose, Bld: 119 mg/dL — ABNORMAL HIGH (ref 70–99)
Phosphorus: 3.9 mg/dL (ref 2.5–4.6)
Potassium: 3.4 mmol/L — ABNORMAL LOW (ref 3.5–5.1)
Sodium: 131 mmol/L — ABNORMAL LOW (ref 135–145)

## 2023-07-04 LAB — GLUCOSE, CAPILLARY
Glucose-Capillary: 114 mg/dL — ABNORMAL HIGH (ref 70–99)
Glucose-Capillary: 121 mg/dL — ABNORMAL HIGH (ref 70–99)
Glucose-Capillary: 124 mg/dL — ABNORMAL HIGH (ref 70–99)
Glucose-Capillary: 111 mg/dL — ABNORMAL HIGH (ref 70–99)

## 2023-07-04 LAB — MAGNESIUM: Magnesium: 1.8 mg/dL (ref 1.7–2.4)

## 2023-07-04 MED ORDER — FUROSEMIDE 10 MG/ML IJ SOLN
80.0000 mg | Freq: Once | INTRAMUSCULAR | Status: AC
  Administered 2023-07-04: 80 mg via INTRAVENOUS
  Filled 2023-07-04: qty 8

## 2023-07-04 MED ORDER — MAGNESIUM SULFATE IN D5W 1-5 GM/100ML-% IV SOLN
1.0000 g | Freq: Once | INTRAVENOUS | Status: AC
  Administered 2023-07-04: 1 g via INTRAVENOUS
  Filled 2023-07-04: qty 100

## 2023-07-04 MED ORDER — POLYSACCHARIDE IRON COMPLEX 150 MG PO CAPS
150.0000 mg | ORAL_CAPSULE | Freq: Every day | ORAL | Status: DC
  Administered 2023-07-04 – 2023-07-10 (×7): 150 mg via ORAL
  Filled 2023-07-04 (×7): qty 1

## 2023-07-04 MED ORDER — POTASSIUM CHLORIDE CRYS ER 20 MEQ PO TBCR
40.0000 meq | EXTENDED_RELEASE_TABLET | Freq: Once | ORAL | Status: AC
  Administered 2023-07-04: 40 meq via ORAL
  Filled 2023-07-04: qty 2

## 2023-07-04 NOTE — Evaluation (Signed)
 Occupational Therapy Evaluation Patient Details Name: Calvin Schultz MRN: [id number] DOB: 1951/04/22 Today's Date: 07/04/2023   History of Present Illness   Pt is a 73 y/o M admitted on 06/26/23 after presenting with c/o worsening weakness, N&V & diarrhea, & LLQ pain. Pt found to be hypotensive with workup concerning for colitis, c. Difficile antigen & toxin positive. (Of note, pt with recent admission for acute on chronic renal failure with L sided hydronephrosis 2/2 several calculi in the L renal collecting system with possible staghorn calculus concerning for L UPJ stricture; nephrostomy tube placed & pt d/c. 06/05/23-06/10/23) PMH: a-fib on Eliquis, HTN, DM, CKD 3, arthritis, HLD     Clinical Impressions Pt. Is needing mod to max a with bed mobility for supine to sit and sit to supine. Pt. Is not able to stand with one person assist. Pt. Has decreased shoulder rom B UE which impair ADLs. Pt. Is not able to dc home at current level and they do want rehab prior to dc.      If plan is discharge home, recommend the following:   Two people to help with walking and/or transfers;Two people to help with bathing/dressing/bathroom (Pt. wife is not able to assist pt. at current level at home. They both want rehab prior to dc.)     Functional Status Assessment   Patient has had a recent decline in their functional status and demonstrates the ability to make significant improvements in function in a reasonable and predictable amount of time.     Equipment Recommendations   None recommended by OT     Recommendations for Other Services         Precautions/Restrictions   Precautions Precautions: Fall Restrictions Weight Bearing Restrictions Per Provider Order: No     Mobility Bed Mobility Overal bed mobility: Needs Assistance Bed Mobility: Supine to Sit     Supine to sit: Max assist          Transfers                          Balance Overall balance assessment:  Needs assistance   Sitting balance-Leahy Scale: Fair                                     ADL either performed or assessed with clinical judgement   ADL Overall ADL's : Needs assistance/impaired Eating/Feeding: Independent   Grooming: Minimal assistance   Upper Body Bathing: Minimal assistance   Lower Body Bathing: Maximal assistance;Bed level   Upper Body Dressing : Maximal assistance   Lower Body Dressing: Total assistance   Toilet Transfer:  (unable secondary to LE weakness and OA in knees)   Toileting- Clothing Manipulation and Hygiene: Total assistance       Functional mobility during ADLs:  (Max A with supine to sit and sit to supine.) General ADL Comments: Pt. is able to perform     Vision Baseline Vision/History: 1 Wears glasses Ability to See in Adequate Light: 0 Adequate Patient Visual Report: No change from baseline       Perception         Praxis         Pertinent Vitals/Pain Pain Assessment Pain Assessment: 0-10 Pain Score: 5  Pain Location: stomach Pain Descriptors / Indicators: Aching Pain Intervention(s): Premedicated before session     Extremity/Trunk Assessment Upper Extremity Assessment Upper Extremity Assessment:  RUE deficits/detail;LUE deficits/detail RUE:  (R shld flex 80 degrees and distal rom is wnl) RUE Coordination: WNL LUE Deficits / Details: unable to lift off of bed secondary to swelling and weakness. distal rom is wnl. LUE Coordination: WNL           Communication Communication Communication: No apparent difficulties   Cognition Arousal: Alert Behavior During Therapy: WFL for tasks assessed/performed Cognition: No apparent impairments                               Following commands: Intact       Cueing  General Comments   Cueing Techniques: Verbal cues      Exercises     Shoulder Instructions      Home Living Family/patient expects to be discharged to:: Private  residence Living Arrangements: Spouse/significant other Available Help at Discharge: Family;Available PRN/intermittently Type of Home: House Home Access: Ramped entrance     Home Layout: Two level;Able to live on main level with bedroom/bathroom     Bathroom Shower/Tub: Producer, television/film/video: Standard     Home Equipment: Agricultural consultant (2 wheels);Rollator (4 wheels);Cane - single point;BSC/3in1;Shower seat;Grab bars - toilet          Prior Functioning/Environment Prior Level of Function : Independent/Modified Independent;Driving               ADLs Comments: Pt. was Mod iI to I with ADLs and was assisting with daughterws care.    OT Problem List: Decreased strength;Decreased activity tolerance;Impaired balance (sitting and/or standing)   OT Treatment/Interventions: Self-care/ADL training;Therapeutic exercise;DME and/or AE instruction;Therapeutic activities;Patient/family education      OT Goals(Current goals can be found in the care plan section)   Acute Rehab OT Goals Patient Stated Goal: to regain strength and take care of himself OT Goal Formulation: With patient Time For Goal Achievement: 07/18/23 Potential to Achieve Goals: Good ADL Goals Pt Will Perform Grooming: with supervision;standing Pt Will Perform Upper Body Bathing: with set-up;with supervision;sitting Pt Will Perform Lower Body Bathing: with min assist;sit to/from stand Pt Will Perform Upper Body Dressing: with min assist;sitting Pt Will Perform Lower Body Dressing: with min assist;sit to/from stand Pt Will Transfer to Toilet: with min assist;ambulating Pt Will Perform Toileting - Clothing Manipulation and hygiene: with min assist;sit to/from stand   OT Frequency:  Min 2X/week    Co-evaluation              AM-PAC OT "6 Clicks" Daily Activity     Outcome Measure Help from another person eating meals?: None Help from another person taking care of personal grooming?: A Lot Help  from another person toileting, which includes using toliet, bedpan, or urinal?: Total Help from another person bathing (including washing, rinsing, drying)?: A Lot Help from another person to put on and taking off regular upper body clothing?: A Lot Help from another person to put on and taking off regular lower body clothing?: Total 6 Click Score: 12   End of Session Nurse Communication:  (ok therapy)  Activity Tolerance: Patient limited by pain Patient left: in bed;with call bell/phone within reach;with family/visitor present  OT Visit Diagnosis: Unsteadiness on feet (R26.81);Muscle weakness (generalized) (M62.81)                Time: 1002-1030 OT Time Calculation (min): 28 min Charges:  OT General Charges $OT Visit: 1 Visit OT Evaluation $OT Eval Moderate Complexity: 1 Mod OT Treatments $  Self Care/Home Management : 8-22 mins  07/04/2023 OT freq:  Sat: Sun:  Mon: Tues: Wed: Thurs: Fri:  Seen by:   Shikita Vaillancourt 07/04/2023, 10:59 AM

## 2023-07-04 NOTE — Progress Notes (Signed)
 [facility] Progress Note  Subjective:  He had 1.9 liters UOP over 2/28.  His wife is at bedside.  He feels ok - just tired.  He's glad to hear about no HD today.   Review of systems:    Denies shortness of breath or chest pain Had nausea on standing     Vitals:   07/04/23 0026 07/04/23 0427 07/04/23 0428 07/04/23 0902  BP: (!) 163/74  (!) 141/72 138/74  Pulse: 71  65 74  Resp: 19  17 18   Temp: 97.7 F (36.5 C)  (!) 97.4 F (36.3 C) 98.6 F (37 C)  TempSrc: Oral  Oral Oral  SpO2: 99%  95% 96%  Weight:  106.3 kg    Height:        Physical Exam:     General adult male in bed in no acute distress HEENT normocephalic atraumatic extraocular movements intact sclera anicteric Neck supple trachea midline Lungs clear to auscultation bilaterally normal work of breathing at rest on room air  Heart S1S2 no rub Abdomen soft nontender nondistended Extremities  2+ edema lower extremities; left arm swelling as well  Psych normal mood and affect Neuro alert and oriented x 3 provides hx and follows commands Access left internal jugular nontunneled dialysis catheter     Renal-related home meds: - norvasc 5 - olmesartan 20 every day - fosrenol 750 mg ac tid - renvela 800 ac tid - others: insulin, eliquis       Date                             Creat               eGFR (ml/min) 04/18/2021                  1.87                 39 06/25/21                        2.00                 36 ml/min 07/2021                         1.73                 42 09/2021                         2.17                 32 12/2021                         2.51                 27 01/2022                         2.48                 28 ml/min, CKD 4 05/30/23- 06/05/23 4.43- 5.34 11- 14 ml/min 06/26/23                        10.8  5 ml/min   UA - prot > 300, mod Hb, large LE, 11-20 rbcs > 50 wbc, 0-5 epi UNa 114, UCr 20 CT renal stone 2/21 - Adrenals/Urinary Tract: Adrenal glands  appear normal. Bilateral nephrolithiasis is noted. Percutaneous left nephrostomy is noted. No hydronephrosis or renal obstruction is noted. Urinary bladder is unremarkable.  CXR 2/21 - IMPRESSION: 1. No acute intrathoracic process.       Assessment/ Plan: AKI on CKD 4 - AKI is ischemic and pre-renal insults related to hypotension +/- ARB and intravasc vol depletion.  CT w/o obstruction, has L neph tube.  He was on CRRT from 2/22 - 2/25.  Note that Cr 4.06- 5.3 from jan 2025, eGFR 11-15 ml/min.  For reference in 01/2022 with Cr 2.48 from Care Everywhere No emergent indication for hemodialysis.  Assess dialysis needs daily.   Continue nontunneled catheter  Lasix 80 mg IV once now  Continue foley catheter Metabolic acidosis -improved with CRRT Shock/ hypotension - possibly related to colitis/ Cdif. Now on po vanc   L nephrostomy tube - placed in Jan 2025 by IR, and is f/b urology. Has staghorn calculi on the L and had previously been scheduled to have stone removed and stent placed per his wife on March 7th at [facility].  Urinary retention - continue foley catheter Chronic atrial fib - on eliquis; per primary team  Hypophosphatemia secondary to CRRT - improved with repletion Bradycardia - CCM has consulted cardiology. HR improved .   Hypokalemia - improved Anemia  - start PO iron - started aranesp 40 mcg every Friday on 2/28  - PRBC's per primary team   Disposition - continue inpatient monitoring   Recent Labs  Lab 07/03/23 0408 07/04/23 0140  HGB 7.0* 7.4*  ALBUMIN 1.7* 1.9*  CALCIUM 7.8* 7.8*  PHOS 4.2 3.9  CREATININE 3.50* 3.95*  K 3.7 3.4*   Recent Labs  Lab 07/03/23 1514  IRON 35*  TIBC 179*  FERRITIN 88   Inpatient medications:  apixaban  2.5 mg Oral BID   Chlorhexidine Gluconate Cloth  6 each Topical Daily   darbepoetin (ARANESP) injection - NON-DIALYSIS  40 mcg Subcutaneous Q Fri-1800   feeding supplement  237 mL Oral BID BM   insulin aspart  0-9 Units  Subcutaneous TID WC   midodrine  10 mg Oral TID WC   multivitamin  1 tablet Oral QHS   vancomycin  500 mg Oral Q6H    metronidazole 500 mg (07/04/23 0615)   acetaminophen, heparin, HYDROmorphone (DILAUDID) injection, menthol-cetylpyridinium, ondansetron (ZOFRAN) IV, mouth rinse, oxyCODONE, simethicone, sodium chloride flush, tiZANidine, traZODone   Estanislado Emms, MD 12:25 PM 07/04/2023

## 2023-07-04 NOTE — Progress Notes (Signed)
 Progress Note   Patient: Calvin Schultz UVO:[id number] DOB: 03-13-1951 DOA: 06/26/2023     8 DOS: the patient was seen and examined on 07/04/2023   Brief hospital course: 73 year old man with PMH of atrial fibrillation on Eliquis, hypertension, diabetes mellitus, CKD, and recently admitted for acute on chronic renal failure with left-sided hydronephrosis secondary to several calculi in the left renal collecting system with possible staghorn calculus concerning for left UPJ stricture. Nephrostomy tube was placed on the left and the patient was discharged with the nephrostomy tube as well as ongoing antibiotics transitioned from ceftriaxone to Bactrim p.o. Also treated for lower extremity edema during that admission with aggressive IV Lasix diuresis.   Initial post discharge course was unremarkable, but ultimately the patient began to experience worsening weakness associated with nausea, vomiting, and diarrhea as well as left lower quadrant pain. Presented to [facility] emergency department on 2/21. Upon arrival to the emergency department he was noted to be hypotensive and IV fluid resuscitation was initiated. Initially blood pressure did respond to IV fluids, however, he was ultimately required pressors. Workup in the ED included CT scan which was concerning for colitis of the descending and sigmoid colon. C. difficile antigen and toxin positive. He was started on appropriate antibiotics.   He was started on CRRT on 2/22 and it was discontinued on 2/25.  He was noted to be bradycardic on 2/26 and was transferred to [facility] for evaluation by cardiology and further management.  Assessment and Plan:  Septic shock Due to C-diff colitis Blood cultures were negative.  Weaned off pressors. - Continue midodrine    C. difficile colitis He does not seem to have been on long-term antibiotics, complains of diarrhea for a year X-ray abdomen showed thumbprinting of transverse colon, no evidence of bowel  obstruction on CT 2/21 Tested positive for C diff on 06/26/23. Started on treatment. Diarrhea is ongoing, with rectal tube in place.  -Continue oral vancomycin and IV Flagyl  for 10-14 days (End date 3/3 or 3/7). Will evaluate need for prolonged (14 day) course based on stool output after removal of rectal tube (removed 3/1).   AKI superimposed on CKD stage IV Baseline creatinine 4.9 Nephrology following.  Patient was on CRRT prior to transfer to [facility]   Status post left nephrostomy  Nephrostomy tube placed 1/29 by IR with plan for ureteric stent placement 3/21-has staghorn calculi on left Nephrology following, appreciate assistance  CRRT stopped, may need IHD at some point although urine output reassuring for renal recovery. - Will monitor urine output, creatinine.     Chronic atrial fibrillation with episodes of bradycardia Seen by cardiology. Cardiology states patient has atrial fibrillation with slow response.  This is a stable rhythm. Per cardiology, -Avoid AV nodal blocking agents -If his rates slow to 20 or less or has significant pauses can start dopamine drip -K>4, Mg>2 -Continue Eliquis  T2DM On levemir insulin at home.  - Will continue diabetic diet.  - SSI.        Subjective: Patient has no new complaints. Seen by PT and IPR recommended.   Physical Exam: Vitals:   07/04/23 0026 07/04/23 0427 07/04/23 0428 07/04/23 0902  BP: (!) 163/74  (!) 141/72 138/74  Pulse: 71  65 74  Resp: 19  17 18   Temp: 97.7 F (36.5 C)  (!) 97.4 F (36.3 C) 98.6 F (37 C)  TempSrc: Oral  Oral Oral  SpO2: 99%  95% 96%  Weight:  106.3  kg    Height:        General: Alert, oriented X3  Eyes: Pupils equal, reactive  Oral cavity: moist mucous membranes  Head: Atraumatic, normocephalic  Neck: supple  Chest: clear to auscultation. No crackles, no wheezes  CVS: S1,S2 RRR. No murmurs  Abd: No distention, soft, non-tender. No masses palpable. L nephrostomy tube Extr: Edema  + MSK: No joint deformities or swelling  Neurological: Grossly intact.   Other: Rectal tube and Foley catheter in place.   Data Reviewed:      Latest Ref Rng & Units 07/04/2023    1:40 AM 07/03/2023    4:08 AM 07/02/2023    7:48 AM  CBC  WBC 4.0 - 10.5 K/uL 23.0  22.5  23.5   Hemoglobin 13.0 - 17.0 g/dL 7.4  7.0  7.9   Hematocrit 39.0 - 52.0 % 24.4  23.9  26.1   Platelets 150 - 400 K/uL 266  228  271       Latest Ref Rng & Units 07/04/2023    1:40 AM 07/03/2023    4:08 AM 07/02/2023    7:49 AM  BMP  Glucose 70 - 99 mg/dL 161  096  045   BUN 8 - 23 mg/dL 29  24  22    Creatinine 0.61 - 1.24 mg/dL 4.09  8.11  9.14   Sodium 135 - 145 mmol/L 131  132  128   Potassium 3.5 - 5.1 mmol/L 3.4  3.7  3.3   Chloride 98 - 111 mmol/L 99  102  97   CO2 22 - 32 mmol/L 24  24  23    Calcium 8.9 - 10.3 mg/dL 7.8  7.8  7.5     Family Communication: Spoke with patient and wife at bedside.   Disposition: Status is: Inpatient Remains inpatient appropriate because: Remains on IV antibiotics, requiring renal replacement therapy  Planned Discharge Destination: Home DVT ppx: Systemic anticoagulation with Eliquis.     Time spent: 35 minutes  Author: Marcine Matar, MD 07/04/2023 1:59 PM  For on call review [email or web address].

## 2023-07-04 NOTE — Progress Notes (Signed)
 Inpatient Rehab Admissions Coordinator:    I met with pt. To discuss potential CIR admits. His wife was present. He states that he is interested and wife is taking FMLA and can provide 24/7 min A. Pt. Is not yet medically ready for CIR, as nephrology continues to follow for iHD needs. I will follow for potential admit pending medical readiness and insurance auth.   Megan Salon, MS, CCC-SLP Rehab Admissions Coordinator  [phone] (celll) [phone] (office)

## 2023-07-05 LAB — GLUCOSE, CAPILLARY
Glucose-Capillary: 85 mg/dL (ref 70–99)
Glucose-Capillary: 178 mg/dL — ABNORMAL HIGH (ref 70–99)
Glucose-Capillary: 201 mg/dL — ABNORMAL HIGH (ref 70–99)
Glucose-Capillary: 84 mg/dL (ref 70–99)

## 2023-07-05 LAB — CBC
HCT: 26.1 % — ABNORMAL LOW (ref 39.0–52.0)
Hemoglobin: 7.9 g/dL — ABNORMAL LOW (ref 13.0–17.0)
MCH: 27.4 pg (ref 26.0–34.0)
MCHC: 30.3 g/dL (ref 30.0–36.0)
MCV: 90.6 fL (ref 80.0–100.0)
Platelets: 258 10*3/uL (ref 150–400)
RBC: 2.88 MIL/uL — ABNORMAL LOW (ref 4.22–5.81)
RDW: 15.5 % (ref 11.5–15.5)
WBC: 19.2 10*3/uL — ABNORMAL HIGH (ref 4.0–10.5)
nRBC: 0 % (ref 0.0–0.2)

## 2023-07-05 LAB — RENAL FUNCTION PANEL
Albumin: 1.9 g/dL — ABNORMAL LOW (ref 3.5–5.0)
Anion gap: 10 (ref 5–15)
BUN: 32 mg/dL — ABNORMAL HIGH (ref 8–23)
CO2: 23 mmol/L (ref 22–32)
Calcium: 7.9 mg/dL — ABNORMAL LOW (ref 8.9–10.3)
Chloride: 97 mmol/L — ABNORMAL LOW (ref 98–111)
Creatinine, Ser: 4.16 mg/dL — ABNORMAL HIGH (ref 0.61–1.24)
GFR, Estimated: 15 mL/min — ABNORMAL LOW (ref >60)
Glucose, Bld: 104 mg/dL — ABNORMAL HIGH (ref 70–99)
Phosphorus: 4.6 mg/dL (ref 2.5–4.6)
Potassium: 3.9 mmol/L (ref 3.5–5.1)
Sodium: 130 mmol/L — ABNORMAL LOW (ref 135–145)

## 2023-07-05 LAB — MAGNESIUM: Magnesium: 1.8 mg/dL (ref 1.7–2.4)

## 2023-07-05 MED ORDER — SODIUM CHLORIDE 0.9 % IV SOLN
12.5000 mg | Freq: Four times a day (QID) | INTRAVENOUS | Status: DC | PRN
  Administered 2023-07-06 – 2023-07-08 (×2): 12.5 mg via INTRAVENOUS
  Filled 2023-07-05: qty 12.5
  Filled 2023-07-05: qty 0.5

## 2023-07-05 NOTE — Progress Notes (Signed)
 Progress Note   Patient: Calvin Schultz UJW:[id number] DOB: Mar 11, 1951 DOA: 06/26/2023     9 DOS: the patient was seen and examined on 07/05/2023   Brief hospital course: 73 year old man with PMH of atrial fibrillation on Eliquis, hypertension, diabetes mellitus, CKD, and recently admitted for acute on chronic renal failure with left-sided hydronephrosis secondary to several calculi in the left renal collecting system with possible staghorn calculus concerning for left UPJ stricture. Nephrostomy tube was placed on the left and the patient was discharged with the nephrostomy tube as well as ongoing antibiotics transitioned from ceftriaxone to Bactrim p.o. Also treated for lower extremity edema during that admission with aggressive IV Lasix diuresis.   Initial post discharge course was unremarkable, but ultimately the patient began to experience worsening weakness associated with nausea, vomiting, and diarrhea as well as left lower quadrant pain. Presented to [facility] emergency department on 2/21. Upon arrival to the emergency department he was noted to be hypotensive and IV fluid resuscitation was initiated. Initially blood pressure did respond to IV fluids, however, he was ultimately required pressors. Workup in the ED included CT scan which was concerning for colitis of the descending and sigmoid colon. C. difficile antigen and toxin positive. He was started on appropriate antibiotics.   He was started on CRRT on 2/22 and it was discontinued on 2/25.  He was noted to be bradycardic on 2/26 and was transferred to [facility] for evaluation by cardiology and further management.  Assessment and Plan:  Septic shock Due to C-diff colitis Blood cultures were negative.  Weaned off pressors. - Continue midodrine    C. difficile colitis He does not seem to have been on long-term antibiotics, complains of diarrhea for a year X-ray abdomen showed thumbprinting of transverse colon, no evidence of bowel  obstruction on CT 2/21 Tested positive for C diff on 06/26/23. Started on treatment. Diarrhea is ongoing.  Rectal tube was removed on 3/1.  Patient continues to have diarrhea. -Continue oral vancomycin and IV Flagyl  for 10-14 days (End date 3/3 or 3/7). Will evaluate need for prolonged (14 day) course based on stool output after removal of rectal tube (removed 3/1).   AKI superimposed on CKD stage IV Baseline creatinine 4.9 Nephrology following.  Patient was on CRRT prior to transfer to [facility].  Nephrology following and evaluating need for longer-term dialysis. -Monitor renal function. -Avoid nephrotoxins.  Status post left nephrostomy  Nephrostomy tube placed 1/29 by IR with plan for ureteric stent placement 3/21-has staghorn calculi on left Nephrology following, appreciate assistance  CRRT stopped, may need IHD at some point although urine output reassuring for renal recovery. - Will monitor urine output, creatinine.     Chronic atrial fibrillation with episodes of bradycardia Seen by cardiology. Cardiology states patient has atrial fibrillation with slow response.  This is a stable rhythm. Per cardiology, -Avoid AV nodal blocking agents -If his rates slow to 20 or less or has significant pauses can start dopamine drip -K>4, Mg>2 -Continue Eliquis  T2DM On levemir insulin at home.  - Will continue diabetic diet.  - SSI.   Deconditioning Seen by PT and IPR recommended. -IPR placement in the coming week.      Subjective: Patient continues to have diarrhea.  5 episodes overnight.  Physical Exam: Vitals:   07/05/23 0120 07/05/23 0520 07/05/23 0640 07/05/23 0907  BP: 126/64 97/62  117/70  Pulse: 73 72  71  Resp: 17 17  18   Temp: 97.7 F (  36.5 C) (!) 97.4 F (36.3 C)  97.7 F (36.5 C)  TempSrc: Oral Oral  Oral  SpO2: 97% 98%  100%  Weight:   102.8 kg   Height:        General: Alert, oriented X3  Eyes: Pupils equal, reactive  Oral cavity: moist mucous  membranes  Head: Atraumatic, normocephalic  Neck: supple  Chest: clear to auscultation. No crackles, no wheezes  CVS: S1,S2 RRR. No murmurs  Abd: No distention, soft, non-tender. No masses palpable. L nephrostomy tube Extr: Edema + MSK: No joint deformities or swelling  Neurological: Grossly intact.   Other: Rectal tube and Foley catheter in place.   Data Reviewed:      Latest Ref Rng & Units 07/05/2023    5:31 AM 07/04/2023    1:40 AM 07/03/2023    4:08 AM  CBC  WBC 4.0 - 10.5 K/uL 19.2  23.0  22.5   Hemoglobin 13.0 - 17.0 g/dL 7.9  7.4  7.0   Hematocrit 39.0 - 52.0 % 26.1  24.4  23.9   Platelets 150 - 400 K/uL 258  266  228       Latest Ref Rng & Units 07/05/2023    5:31 AM 07/04/2023    1:40 AM 07/03/2023    4:08 AM  BMP  Glucose 70 - 99 mg/dL 161  096  045   BUN 8 - 23 mg/dL 32  29  24   Creatinine 0.61 - 1.24 mg/dL 4.09  8.11  9.14   Sodium 135 - 145 mmol/L 130  131  132   Potassium 3.5 - 5.1 mmol/L 3.9  3.4  3.7   Chloride 98 - 111 mmol/L 97  99  102   CO2 22 - 32 mmol/L 23  24  24    Calcium 8.9 - 10.3 mg/dL 7.9  7.8  7.8     Family Communication: Spoke with patient and wife at bedside.   Disposition: Status is: Inpatient Remains inpatient appropriate because: Remains on IV and oral antibiotics, requiring renal replacement therapy  Planned Discharge Destination: Home DVT ppx: Systemic anticoagulation with Eliquis.     Time spent: 35 minutes  Author: Marcine Matar, MD 07/05/2023 2:06 PM  For on call review [email or web address].

## 2023-07-05 NOTE — Plan of Care (Signed)
  Problem: Coping: Goal: Ability to adjust to condition or change in health will improve Outcome: Progressing   Problem: Fluid Volume: Goal: Ability to maintain a balanced intake and output will improve Outcome: Progressing   Problem: Health Behavior/Discharge Planning: Goal: Ability to identify and utilize available resources and services will improve Outcome: Progressing Goal: Ability to manage health-related needs will improve Outcome: Progressing   Problem: Metabolic: Goal: Ability to maintain appropriate glucose levels will improve Outcome: Progressing   Problem: Nutritional: Goal: Maintenance of adequate nutrition will improve Outcome: Progressing Goal: Progress toward achieving an optimal weight will improve Outcome: Progressing   Problem: Skin Integrity: Goal: Risk for impaired skin integrity will decrease Outcome: Progressing   Problem: Tissue Perfusion: Goal: Adequacy of tissue perfusion will improve Outcome: Progressing   Problem: Education: Goal: Knowledge of General Education information will improve Description: Including pain rating scale, medication(s)/side effects and non-pharmacologic comfort measures Outcome: Progressing   Problem: Health Behavior/Discharge Planning: Goal: Ability to manage health-related needs will improve Outcome: Progressing   Problem: Clinical Measurements: Goal: Ability to maintain clinical measurements within normal limits will improve Outcome: Progressing Goal: Will remain free from infection Outcome: Progressing Goal: Diagnostic test results will improve Outcome: Progressing Goal: Respiratory complications will improve Outcome: Progressing Goal: Cardiovascular complication will be avoided Outcome: Progressing   Problem: Nutrition: Goal: Adequate nutrition will be maintained Outcome: Progressing   Problem: Coping: Goal: Level of anxiety will decrease Outcome: Progressing   Problem: Pain Managment: Goal: General  experience of comfort will improve and/or be controlled Outcome: Progressing   Problem: Safety: Goal: Ability to remain free from injury will improve Outcome: Progressing   Problem: Skin Integrity: Goal: Risk for impaired skin integrity will decrease Outcome: Progressing   Problem: Activity: Goal: Risk for activity intolerance will decrease Outcome: Not Progressing   Problem: Elimination: Goal: Will not experience complications related to bowel motility Outcome: Not Progressing Goal: Will not experience complications related to urinary retention Outcome: Not Progressing  Frequent stools. Afraid to eat but eating the protein. Limiting carbs. Encouraged turning and bed exercises to regain strength

## 2023-07-05 NOTE — Progress Notes (Signed)
 [facility] Progress Note  Subjective:  He had 3.3 liters UOP over 3/1.  His wife is at the bedside.  Glad to be improving a bit overall   Review of systems:    Denies shortness of breath or chest pain Denies n/v No dizziness or "lightheadedness", which is what he calls that sensation    Vitals:   07/04/23 2034 07/05/23 0120 07/05/23 0520 07/05/23 0640  BP: 112/77 126/64 97/62   Pulse: 72 73 72   Resp: 18 17 17    Temp: 98.4 F (36.9 C) 97.7 F (36.5 C) (!) 97.4 F (36.3 C)   TempSrc: Oral Oral Oral   SpO2: 100% 97% 98%   Weight:    102.8 kg  Height:        Physical Exam:     General adult male in bed in no acute distress HEENT normocephalic atraumatic extraocular movements intact sclera anicteric Neck supple trachea midline Lungs clear to auscultation bilaterally normal work of breathing at rest on room air  Heart S1S2 no rub Abdomen soft nontender nondistended Extremities  2+ edema lower extremities  Psych normal mood and affect Neuro alert and oriented x 3 provides hx and follows commands Access left internal jugular nontunneled dialysis catheter     Renal-related home meds: - norvasc 5 - olmesartan 20 every day - fosrenol 750 mg ac tid - renvela 800 ac tid - others: insulin, eliquis       Date                             Creat               eGFR (ml/min) 04/18/2021                  1.87                 39 06/25/21                        2.00                 36 ml/min 07/2021                         1.73                 42 09/2021                         2.17                 32 12/2021                         2.51                 27 01/2022                         2.48                 28 ml/min, CKD 4 05/30/23- 06/05/23 4.43- 5.34 11- 14 ml/min 06/26/23                        10.8                 5 ml/min  UA - prot > 300, mod Hb, large LE, 11-20 rbcs > 50 wbc, 0-5 epi UNa 114, UCr 20 CT renal stone 2/21 - Adrenals/Urinary Tract: Adrenal glands  appear normal. Bilateral nephrolithiasis is noted. Percutaneous left nephrostomy is noted. No hydronephrosis or renal obstruction is noted. Urinary bladder is unremarkable.  CXR 2/21 - IMPRESSION: 1. No acute intrathoracic process.       Assessment/ Plan: AKI on CKD 4 - AKI is ischemic and pre-renal insults related to hypotension +/- ARB and intravasc vol depletion.  CT w/o obstruction, has L neph tube.  He was on CRRT from 2/22 - 2/25.  Note that Cr 4.06- 5.3 from jan 2025, eGFR 11-15 ml/min.  For reference in 01/2022 with Cr 2.48 from Care Everywhere No emergent indication for hemodialysis.  Assess dialysis needs daily.  Nonoliguric.  Hopefully he has almost plateaued.      Continue nontunneled catheter for now.  Would need tunneled catheter if continues to require HD.  WBC trending down  Defer lasix today, he is diuresing well   Continue foley catheter Metabolic acidosis -improved with CRRT Shock/ hypotension - possibly related to colitis/ Cdif. Now on po vanc   L nephrostomy tube - placed in Jan 2025 by IR, and is f/b urology. Has staghorn calculi on the L and had previously been scheduled to have stone removed and stent placed per his wife on March 7th at [facility].  Urinary retention - continue foley catheter Chronic atrial fib - on eliquis; per primary team  Hypophosphatemia secondary to CRRT - improved with repletion Bradycardia - cardiology consulted. HR improved .   Hypokalemia - improved  Anemia normocytic  - on PO iron - started aranesp 40 mcg every Friday on 2/28  - PRBC's per primary team   Disposition - continue inpatient monitoring   Recent Labs  Lab 07/04/23 0140 07/05/23 0531  HGB 7.4* 7.9*  ALBUMIN 1.9* 1.9*  CALCIUM 7.8* 7.9*  PHOS 3.9 4.6  CREATININE 3.95* 4.16*  K 3.4* 3.9   Recent Labs  Lab 07/03/23 1514  IRON 35*  TIBC 179*  FERRITIN 88   Inpatient medications:  apixaban  2.5 mg Oral BID   Chlorhexidine Gluconate Cloth  6 each Topical Daily    darbepoetin (ARANESP) injection - NON-DIALYSIS  40 mcg Subcutaneous Q Fri-1800   feeding supplement  237 mL Oral BID BM   insulin aspart  0-9 Units Subcutaneous TID WC   iron polysaccharides  150 mg Oral Daily   midodrine  10 mg Oral TID WC   multivitamin  1 tablet Oral QHS   vancomycin  500 mg Oral Q6H    metronidazole 500 mg (07/05/23 0522)   acetaminophen, heparin, HYDROmorphone (DILAUDID) injection, menthol-cetylpyridinium, ondansetron (ZOFRAN) IV, mouth rinse, oxyCODONE, simethicone, sodium chloride flush, tiZANidine, traZODone   Estanislado Emms, MD 9:39 AM 07/05/2023

## 2023-07-06 LAB — CBC
HCT: 24.4 % — ABNORMAL LOW (ref 39.0–52.0)
Hemoglobin: 7.4 g/dL — ABNORMAL LOW (ref 13.0–17.0)
MCH: 26.9 pg (ref 26.0–34.0)
MCHC: 30.3 g/dL (ref 30.0–36.0)
MCV: 88.7 fL (ref 80.0–100.0)
Platelets: 272 10*3/uL (ref 150–400)
RBC: 2.75 MIL/uL — ABNORMAL LOW (ref 4.22–5.81)
RDW: 15.8 % — ABNORMAL HIGH (ref 11.5–15.5)
WBC: 16.1 10*3/uL — ABNORMAL HIGH (ref 4.0–10.5)
nRBC: 0 % (ref 0.0–0.2)

## 2023-07-06 LAB — RENAL FUNCTION PANEL
Albumin: 1.8 g/dL — ABNORMAL LOW (ref 3.5–5.0)
Anion gap: 9 (ref 5–15)
BUN: 34 mg/dL — ABNORMAL HIGH (ref 8–23)
CO2: 22 mmol/L (ref 22–32)
Calcium: 7.8 mg/dL — ABNORMAL LOW (ref 8.9–10.3)
Chloride: 99 mmol/L (ref 98–111)
Creatinine, Ser: 4.51 mg/dL — ABNORMAL HIGH (ref 0.61–1.24)
GFR, Estimated: 13 mL/min — ABNORMAL LOW (ref >60)
Glucose, Bld: 93 mg/dL (ref 70–99)
Phosphorus: 4.6 mg/dL (ref 2.5–4.6)
Potassium: 3.9 mmol/L (ref 3.5–5.1)
Sodium: 130 mmol/L — ABNORMAL LOW (ref 135–145)

## 2023-07-06 LAB — GLUCOSE, CAPILLARY
Glucose-Capillary: 81 mg/dL (ref 70–99)
Glucose-Capillary: 135 mg/dL — ABNORMAL HIGH (ref 70–99)
Glucose-Capillary: 107 mg/dL — ABNORMAL HIGH (ref 70–99)
Glucose-Capillary: 177 mg/dL — ABNORMAL HIGH (ref 70–99)

## 2023-07-06 LAB — MAGNESIUM: Magnesium: 1.9 mg/dL (ref 1.7–2.4)

## 2023-07-06 MED ORDER — ALTEPLASE 2 MG IJ SOLR
2.0000 mg | Freq: Once | INTRAMUSCULAR | Status: DC
  Filled 2023-07-06: qty 2

## 2023-07-06 NOTE — Progress Notes (Signed)
 Progress Note   Patient: Calvin Schultz [id number] DOB: June 08, 1950 DOA: 06/26/2023     10 DOS: the patient was seen and examined on 07/06/2023   Brief hospital course: 73 year old man with PMH of atrial fibrillation on Eliquis, hypertension, diabetes mellitus, CKD, and recently admitted for acute on chronic renal failure with left-sided hydronephrosis secondary to several calculi in the left renal collecting system with possible staghorn calculus concerning for left UPJ stricture. Nephrostomy tube was placed on the left and the patient was discharged with the nephrostomy tube as well as ongoing antibiotics transitioned from ceftriaxone to Bactrim p.o. Also treated for lower extremity edema during that admission with aggressive IV Lasix diuresis.   Initial post discharge course was unremarkable, but ultimately the patient began to experience worsening weakness associated with nausea, vomiting, and diarrhea as well as left lower quadrant pain. Presented to [facility] emergency department on 2/21. Upon arrival to the emergency department he was noted to be hypotensive and IV fluid resuscitation was initiated. Initially blood pressure did respond to IV fluids, however, he was ultimately required pressors. Workup in the ED included CT scan which was concerning for colitis of the descending and sigmoid colon. C. difficile antigen and toxin positive. He was started on appropriate antibiotics.   He was started on CRRT on 2/22 and it was discontinued on 2/25.  He was noted to be bradycardic on 2/26 and was transferred to [facility] for evaluation by cardiology and further management.  Assessment and Plan:  Septic shock Due to C-diff colitis Blood cultures were negative.  Weaned off pressors. - Continue midodrine    C. difficile colitis He does not seem to have been on long-term antibiotics, complains of diarrhea for a year X-ray abdomen showed thumbprinting of transverse colon, no evidence of bowel  obstruction on CT 2/21 Tested positive for C diff on 06/26/23. Started on treatment. Diarrhea is ongoing.  Rectal tube was removed on 3/1.  Patient continues to have diarrhea but it does seem to be slowing down. -Continue oral vancomycin for total of 14 days (end date 3/7). -DC IV Flagyl 3/3, as patient has completed 10 days of therapy and seems to be improving.  AKI superimposed on CKD stage IV Baseline creatinine 4.9 Nephrology following.  Patient was on CRRT prior to transfer to [facility].  Nephrology following and evaluating need for longer-term dialysis. -Monitor renal function. -Avoid nephrotoxins.  Status post left nephrostomy  Nephrostomy tube placed 1/29 by IR with plan for ureteric stent placement 3/21-has staghorn calculi on left Nephrology following, appreciate assistance  CRRT stopped, may need IHD at some point although urine output reassuring for renal recovery. - Will monitor urine output, creatinine.     Chronic atrial fibrillation with episodes of bradycardia Seen by cardiology. Cardiology states patient has atrial fibrillation with slow response.  This is a stable rhythm. Per cardiology, -Avoid AV nodal blocking agents -If his rates slow to 20 or less or has significant pauses can start dopamine drip -K>4, Mg>2 -Continue Eliquis  T2DM On levemir insulin at home.  - Will continue diabetic diet.  - SSI.   Deconditioning Seen by PT and IPR recommended. -Pending IPR placement this week.      Subjective: Patient says diarrhea is slowing down.  2-3 episodes over the past 24 hours.  Physical Exam: Vitals:   07/05/23 1632 07/05/23 2122 07/06/23 0503 07/06/23 0810  BP: 103/69 101/89 129/65 127/65  Pulse: 75 65 68 76  Resp:  18 19  19  Temp: 98.4 F (36.9 C) (!) 97.5 F (36.4 C) 98.1 F (36.7 C)   TempSrc: Oral Oral Oral   SpO2: 99% 96%  99%  Weight:      Height:        General: Alert, oriented X3  Eyes: Pupils equal, reactive  Oral cavity: moist  mucous membranes  Head: Atraumatic, normocephalic  Neck: supple. R internal jugular CVC Chest: clear to auscultation. No crackles, no wheezes  CVS: S1,S2 RRR. No murmurs  Abd: No distention, soft, non-tender. No masses palpable. L nephrostomy tube Extr: Trace edema MSK: No joint deformities or swelling  Neurological: Grossly intact.   Other: Foley catheter in place.   Data Reviewed:      Latest Ref Rng & Units 07/06/2023    4:19 AM 07/05/2023    5:31 AM 07/04/2023    1:40 AM  CBC  WBC 4.0 - 10.5 K/uL 16.1  19.2  23.0   Hemoglobin 13.0 - 17.0 g/dL 7.4  7.9  7.4   Hematocrit 39.0 - 52.0 % 24.4  26.1  24.4   Platelets 150 - 400 K/uL 272  258  266       Latest Ref Rng & Units 07/06/2023    4:19 AM 07/05/2023    5:31 AM 07/04/2023    1:40 AM  BMP  Glucose 70 - 99 mg/dL 93  846  962   BUN 8 - 23 mg/dL 34  32  29   Creatinine 0.61 - 1.24 mg/dL 9.52  8.41  3.24   Sodium 135 - 145 mmol/L 130  130  131   Potassium 3.5 - 5.1 mmol/L 3.9  3.9  3.4   Chloride 98 - 111 mmol/L 99  97  99   CO2 22 - 32 mmol/L 22  23  24    Calcium 8.9 - 10.3 mg/dL 7.8  7.9  7.8     Family Communication: Spoke with patient and wife at bedside.   Disposition: Status is: Inpatient Remains inpatient appropriate because: pending discharge to IPR  Planned Discharge Destination: Home DVT ppx: Systemic anticoagulation with Eliquis.     Time spent: 35 minutes  Author: Marcine Matar, MD 07/06/2023 2:35 PM  For on call review [email or web address].

## 2023-07-06 NOTE — Progress Notes (Signed)
 [facility] Progress Note  Subjective:  At bedside with wife Remains weak but is in chair Only 0.8L UOP reported, ? Accuracy SCr 4.16 to 4.51, K and HCO3 stable  Review of systems:       Vitals:   07/05/23 1632 07/05/23 2122 07/06/23 0503 07/06/23 0810  BP: 103/69 101/89 129/65 127/65  Pulse: 75 65 68 76  Resp:  18 19 19   Temp: 98.4 F (36.9 C) (!) 97.5 F (36.4 C) 98.1 F (36.7 C)   TempSrc: Oral Oral Oral   SpO2: 99% 96%  99%  Weight:      Height:        Physical Exam:     General adult male in no acute distress HEENT normocephalic atraumatic extraocular movements intact sclera anicteric Neck supple trachea midline Lungs clear to auscultation bilaterally normal work of breathing at rest on room air  Heart S1S2 no rub Abdomen soft nontender nondistended Extremities  2+ edema lower extremities  Psych normal mood and affect Neuro alert and oriented x 3 provides hx and follows commands Access left internal jugular nontunneled dialysis catheter     Renal-related home meds: - norvasc 5 - olmesartan 20 every day - fosrenol 750 mg ac tid - renvela 800 ac tid - others: insulin, eliquis       Date                             Creat               eGFR (ml/min) 04/18/2021                  1.87                 39 06/25/21                        2.00                 36 ml/min 07/2021                         1.73                 42 09/2021                         2.17                 32 12/2021                         2.51                 27 01/2022                         2.48                 28 ml/min, CKD 4 05/30/23- 06/05/23 4.43- 5.34 11- 14 ml/min 06/26/23                        10.8                 5 ml/min   UA - prot > 300, mod Hb, large LE, 11-20 rbcs > 50 wbc, 0-5 epi UNa 114, UCr 20  CT renal stone 2/21 - Adrenals/Urinary Tract: Adrenal glands appear normal. Bilateral nephrolithiasis is noted. Percutaneous left nephrostomy is noted. No hydronephrosis  or renal obstruction is noted. Urinary bladder is unremarkable.  CXR 2/21 - IMPRESSION: 1. No acute intrathoracic process.       Assessment/ Plan: Nonoliguric AKI on CKD 4 - AKI is ischemic and pre-renal insults related to hypotension +/- ARB and intravasc vol depletion.  CT w/o obstruction, has L neph tube.  He was on CRRT from 2/22 - 2/25.  Note that Cr 4.06- 5.3 from jan 2025, eGFR 11-15 ml/min.  For reference in 01/2022 with Cr 2.48 from Care Everywhere No emergent indication for hemodialysis.  Assess dialysis needs daily.  Nonoliguric.  Hopefully he has almost plateaued.    No HD today.  Continue nontunneled catheter for now.  Would need tunneled catheter if continues to require HD.  WBC trending down.  If stable again tomorrow perhaps remove temp HD cath Defer lasix today, trend UOP Continue foley catheter and PCN Metabolic acidosis -resolved Shock/ hypotension - possibly related to colitis/ Cdif. Now on po vanc; stable L nephrostomy tube - placed in Jan 2025 by IR, and is f/b urology. Has staghorn calculi on the L and had previously been scheduled to have stone removed and stent placed per his wife on March 7th at [facility]. Will need to be rescheduled.  Urinary retention - continue foley catheter Chronic atrial fib - on eliquis; per primary team  Hypophosphatemia secondary to CRRT - improved with repletion Bradycardia - cardiology consulted. HR improved .   Hypokalemia - improved  Anemia normocytic  - on PO iron - started aranesp 40 mcg every Friday on 2/28  - PRBC's per primary team   Disposition - continue inpatient monitoring   Recent Labs  Lab 07/05/23 0531 07/06/23 0419  HGB 7.9* 7.4*  ALBUMIN 1.9* 1.8*  CALCIUM 7.9* 7.8*  PHOS 4.6 4.6  CREATININE 4.16* 4.51*  K 3.9 3.9   Recent Labs  Lab 07/03/23 1514  IRON 35*  TIBC 179*  FERRITIN 88   Inpatient medications:  alteplase  2 mg Intracatheter Once   apixaban  2.5 mg Oral BID   Chlorhexidine Gluconate Cloth   6 each Topical Daily   darbepoetin (ARANESP) injection - NON-DIALYSIS  40 mcg Subcutaneous Q Fri-1800   feeding supplement  237 mL Oral BID BM   insulin aspart  0-9 Units Subcutaneous TID WC   iron polysaccharides  150 mg Oral Daily   midodrine  10 mg Oral TID WC   multivitamin  1 tablet Oral QHS   vancomycin  500 mg Oral Q6H    metronidazole 500 mg (07/06/23 0509)   promethazine (PHENERGAN) injection (IM or IVPB) 12.5 mg (07/06/23 0112)   acetaminophen, heparin, HYDROmorphone (DILAUDID) injection, menthol-cetylpyridinium, ondansetron (ZOFRAN) IV, mouth rinse, oxyCODONE, promethazine (PHENERGAN) injection (IM or IVPB), simethicone, sodium chloride flush, tiZANidine, traZODone   Arita Miss, MD 11:54 AM 07/06/2023

## 2023-07-06 NOTE — Progress Notes (Addendum)
 Nutrition Follow-up  DOCUMENTATION CODES:   Obesity unspecified  INTERVENTION:  - Liberalize diet to Carb Modified and remove renal restriction to allow a greater variety of menu options.  - Discontinue Ensure Plus High Protein  per patient preference -Double proteins w/ meals -Magic cup TID with meals, each supplement provides 290 kcal and 9 grams of protein - Continue renal MVI   NUTRITION DIAGNOSIS:  Increased nutrient needs related to acute illness (AKI superimposed on CKD stage IV - on CRRT) as evidenced by estimated needs. - remains applicable despite d/c CRRT  GOAL:  Patient will meet greater than or equal to 90% of their needs - progressing  MONITOR:  PO intake, Supplement acceptance, Weight trends  REASON FOR ASSESSMENT:  Consult Assessment of nutrition requirement/status  ASSESSMENT:  73 y.o. male with PMH CKD stage 3, Diabetes mellitus, HLD, HTN who presented with N/V/D and left lower quadrant pain. Admitted for renal failure and hypotension.  1/29 nephrostomy tube placed 2/21 admit to [facility] 2/22 CRRT initiated 2/25 CRRT stopped  2/26 transferred to [facility]  3/1 FMS removed  Pt experienced septic shock 2/2 C-diff colitis. Required CRRT, which has now been discontinued. Remains off of dialysis at this time. Monitoring for renal recovery. UOP reassuring, per nephrology.   Appetite picking up. Took 100% x2 meals today. Does not prefer Ensure supplement. Will discontinue. Add Magic Cup and double portion protein for continued protein supplementation. Protein intake recommendation adjusted now that CRRT has been discontinued. Will monitor for re-initiation of dialysis.   Still a bit above endorsed UBW of 97kg. Wt loss observed and consistent with fluid removed with CRRT. Following weight trend. Appears well-nourished.   Admit Weight: 105.5kg Current Weight: 102.8kg  With UOP yesterday. Mild, generalized edema noted. Moderate pitting edema to LUE. FMS  removed 3/1. Noted BM today.   Intake/Output Summary (Last 24 hours) at 07/06/2023 1641 Last data filed at 07/06/2023 1300 Gross per 24 hour  Intake 1111.99 ml  Output 1050 ml  Net 61.99 ml    Net IO Since Admission: -1,541.1 mL [07/06/23 1641]   Drains/Lines: L nephrostomy: 5mL RIJ UOP:  Baseline Crt 4.9 and back to baseline. Hgb low; remains on ESA. Pt endorses diarrhea is improving.   Meds: darbepoetin alfa, SSI, renal MVI, ABX 12.5mg  promethazine x1  Labs:   Na+ 131>130>130 (L) Crt 3.95>4.16>4.51 (H) BUN 29>32>34 (H) WBC 23>19.2>16.1 (H)  Hgb 7.4>7.9>7.4 (L) CBGs 93-104 x48 hours A1c 6.2 (06/2023)   Diet Order:   Diet Order             Diet Carb Modified Fluid consistency: Thin; Room service appropriate? Yes; Fluid restriction: 2000 mL Fluid  Diet effective now             EDUCATION NEEDS:  Education needs have been addressed  Skin:  Skin Assessment: Reviewed RN Assessment  Last BM:  3/3  Height:  Ht Readings from Last 1 Encounters:  06/26/23 5\' 9"  (1.753 m)   Weight:  Wt Readings from Last 1 Encounters:  07/05/23 102.8 kg   Ideal Body Weight:  72.73 kg  BMI:  Body mass index is 33.47 kg/m.  Estimated Nutritional Needs:   Kcal:  2200-2500 kcals  Protein:  100-115g  Fluid:  >/= 2.2L  Myrtie Cruise MS, RD, LDN Registered Dietitian Clinical Nutrition RD Inpatient Contact Info in Amion

## 2023-07-06 NOTE — Progress Notes (Signed)
 Inpatient Rehab Admissions Coordinator:   CIR following. Will send case to insurance today.   Megan Salon, MS, CCC-SLP Rehab Admissions Coordinator  [phone] (celll) [phone] (office)

## 2023-07-06 NOTE — Progress Notes (Signed)
   07/05/23 2234  Provider Notification  Provider Name/Title Dr. Arville Care  Date Provider Notified 07/05/23  Time Provider Notified 2234  Method of Notification Page  Notification Reason Requested by patient/family  Provider response See new orders  Date of Provider Response 07/05/23   Patient vomiting.  Zofran given at 2038 with minimal relief.  Dr. Arville Care made aware.  Order received for IV Phenergan and given to patient with good relief.  Will continue to monitor patient.  Bernie Covey RN

## 2023-07-06 NOTE — Progress Notes (Signed)
 Physical Therapy Treatment Patient Details Name: Calvin Schultz MRN: [id number] DOB: 09-Mar-1951 Today's Date: 07/06/2023   History of Present Illness Pt is a 73 y/o M admitted on 06/26/23 after presenting with c/o worsening weakness, N&V & diarrhea, & LLQ pain. Pt found to be hypotensive with workup concerning for colitis, c. Difficile antigen & toxin positive. (Of note, pt with recent admission for acute on chronic renal failure with L sided hydronephrosis 2/2 several calculi in the L renal collecting system with possible staghorn calculus concerning for L UPJ stricture; nephrostomy tube placed & pt d/c. 06/05/23-06/10/23) PMH: a-fib on Eliquis, HTN, DM, CKD 3, arthritis, HLD    PT Comments  Pt admitted with above diagnosis. Pt in chair at end of session and performing LE exercises with cues.  Pt was able to tolerate over 45 min session with PT today.  Able to stand for 2 1/2 min in [place] with min assist and max cues to stand and needing max assist of 2 to get to standing position. Pt very motivated to work with PT and to get stronger and go home. Wife present and supportive.  Continue to recommend post acute rehab > 3 hours day.  Will continue to follow.  Pt currently with functional limitations due to the deficits listed below (see PT Problem List). Pt will benefit from acute skilled PT to increase their independence and safety with mobility to allow discharge.       If plan is discharge home, recommend the following: Two people to help with walking and/or transfers;Two people to help with bathing/dressing/bathroom;Help with stairs or ramp for entrance;Assist for transportation;Assistance with feeding;Direct supervision/assist for financial management;Assistance with cooking/housework   Can travel by private vehicle        Equipment Recommendations  Other (comment) (defer to next venue)    Recommendations for Other Services Rehab consult;OT consult     Precautions / Restrictions  Precautions Precautions: Fall Precaution/Restrictions Comments: L nephrostomy tube, foley catheter, rectal tube Restrictions Weight Bearing Restrictions Per Provider Order: No     Mobility  Bed Mobility Overal bed mobility: Needs Assistance Bed Mobility: Supine to Sit, Rolling Rolling: Max assist, +2 for physical assistance, Used rails   Supine to sit: Max assist, +2 for physical assistance, HOB elevated, Used rails     General bed mobility comments: Pt reports he had a BM on arrival.  PT and mobility specialist assisted pt with rolling to be cleaned and changed linens.  Pt able to assist bringing legs off EOB, requires cuing & assistance to upright trunk even with HOB elevated, assistance to scoot buttocks to EOB with use of pad.    Transfers Overall transfer level: Needs assistance Equipment used: Ambulation equipment used Transfers: Sit to/from Stand, Bed to chair/wheelchair/BSC Sit to Stand: +2 physical assistance, +2 safety/equipment, From elevated surface, Max assist, Via lift equipment           General transfer comment: PT provides cuing re: hand and foot placement on the STedy, significantly elevated EOB, pt with limited ability to move BLE underneath BOS 2/2 limited knee flexion. Pt requires max assist +2 for STS to power up with use of pad and has difficulty with achieving full upright posture. Pt moved to chair in [place].  Pt stood again and was able to stand with CGA to min assist in [facility] for 2 1/2 min.  Pt pushed himself and stood as long as he could in the [place]. Transfer via Lift Equipment: Stedy  Ambulation/Gait  Stairs             Wheelchair Mobility     Tilt Bed    Modified Rankin (Stroke Patients Only)       Balance Overall balance assessment: Needs assistance Sitting-balance support: No upper extremity supported, Feet supported Sitting balance-Leahy Scale: Fair Sitting balance - Comments: supervision static  sitting   Standing balance support: Reliant on assistive device for balance, During functional activity, Bilateral upper extremity supported Standing balance-Leahy Scale: Poor Standing balance comment: relies on STedy and +2 min to CGA for static standing.                            Communication Communication Communication: No apparent difficulties  Cognition Arousal: Alert Behavior During Therapy: WFL for tasks assessed/performed   PT - Cognitive impairments: No apparent impairments                         Following commands: Intact      Cueing Cueing Techniques: Verbal cues  Exercises General Exercises - Lower Extremity Gluteal Sets: AROM, Both, 5 reps, Seated Long Arc Quad: AROM, Seated, Strengthening, Both, 10 reps Other Exercises Other Exercises: Weight shifting side to side in [place]    General Comments General comments (skin integrity, edema, etc.): VSS      Pertinent Vitals/Pain Pain Assessment Pain Assessment: Faces Faces Pain Scale: Hurts whole lot Pain Location: knees Pain Descriptors / Indicators: Aching, Grimacing, Guarding, Discomfort Pain Intervention(s): Limited activity within patient's tolerance, Monitored during session, Repositioned, Premedicated before session, Patient requesting pain meds-RN notified, RN gave pain meds during session    Home Living                          Prior Function            PT Goals (current goals can now be found in the care plan section) Progress towards PT goals: Progressing toward goals    Frequency    Min 1X/week      PT Plan      Co-evaluation              AM-PAC PT "6 Clicks" Mobility   Outcome Measure  Help needed turning from your back to your side while in a flat bed without using bedrails?: A Lot Help needed moving from lying on your back to sitting on the side of a flat bed without using bedrails?: A Lot Help needed moving to and from a bed to a chair  (including a wheelchair)?: Total Help needed standing up from a chair using your arms (e.g., wheelchair or bedside chair)?: Total Help needed to walk in hospital room?: Total Help needed climbing 3-5 steps with a railing? : Total 6 Click Score: 8    End of Session Equipment Utilized During Treatment: Gait belt Activity Tolerance: Patient tolerated treatment well;Patient limited by fatigue Patient left: in chair;with chair alarm set;with call bell/phone within reach;with family/visitor present Nurse Communication: Mobility status;Need for lift equipment Antony Salmon) PT Visit Diagnosis: Difficulty in walking, not elsewhere classified (R26.2);Other abnormalities of gait and mobility (R26.89);Muscle weakness (generalized) (M62.81)     Time: 1610-9604 PT Time Calculation (min) (ACUTE ONLY): 51 min  Charges:    $Therapeutic Exercise: 8-22 mins $Therapeutic Activity: 23-37 mins PT General Charges $$ ACUTE PT VISIT: 1 Visit  [facility] M,PT Acute Rehab Services [phone]    Calvin Schultz 07/06/2023, 10:19 AM

## 2023-07-07 LAB — RENAL FUNCTION PANEL
Albumin: 1.8 g/dL — ABNORMAL LOW (ref 3.5–5.0)
Anion gap: 9 (ref 5–15)
BUN: 38 mg/dL — ABNORMAL HIGH (ref 8–23)
CO2: 21 mmol/L — ABNORMAL LOW (ref 22–32)
Calcium: 7.9 mg/dL — ABNORMAL LOW (ref 8.9–10.3)
Chloride: 100 mmol/L (ref 98–111)
Creatinine, Ser: 4.71 mg/dL — ABNORMAL HIGH (ref 0.61–1.24)
GFR, Estimated: 13 mL/min — ABNORMAL LOW (ref >60)
Glucose, Bld: 131 mg/dL — ABNORMAL HIGH (ref 70–99)
Phosphorus: 4.7 mg/dL — ABNORMAL HIGH (ref 2.5–4.6)
Potassium: 3.6 mmol/L (ref 3.5–5.1)
Sodium: 130 mmol/L — ABNORMAL LOW (ref 135–145)

## 2023-07-07 LAB — GLUCOSE, CAPILLARY
Glucose-Capillary: 111 mg/dL — ABNORMAL HIGH (ref 70–99)
Glucose-Capillary: 149 mg/dL — ABNORMAL HIGH (ref 70–99)
Glucose-Capillary: 133 mg/dL — ABNORMAL HIGH (ref 70–99)
Glucose-Capillary: 131 mg/dL — ABNORMAL HIGH (ref 70–99)

## 2023-07-07 LAB — CBC
HCT: 24.5 % — ABNORMAL LOW (ref 39.0–52.0)
Hemoglobin: 7.4 g/dL — ABNORMAL LOW (ref 13.0–17.0)
MCH: 27.5 pg (ref 26.0–34.0)
MCHC: 30.2 g/dL (ref 30.0–36.0)
MCV: 91.1 fL (ref 80.0–100.0)
Platelets: 297 10*3/uL (ref 150–400)
RBC: 2.69 MIL/uL — ABNORMAL LOW (ref 4.22–5.81)
RDW: 16 % — ABNORMAL HIGH (ref 11.5–15.5)
WBC: 17.3 10*3/uL — ABNORMAL HIGH (ref 4.0–10.5)
nRBC: 0 % (ref 0.0–0.2)

## 2023-07-07 LAB — MAGNESIUM: Magnesium: 1.8 mg/dL (ref 1.7–2.4)

## 2023-07-07 NOTE — Plan of Care (Signed)
  Problem: Coping: Goal: Ability to adjust to condition or change in health will improve Outcome: Progressing   Problem: Fluid Volume: Goal: Ability to maintain a balanced intake and output will improve Outcome: Progressing   Problem: Health Behavior/Discharge Planning: Goal: Ability to identify and utilize available resources and services will improve Outcome: Progressing Goal: Ability to manage health-related needs will improve Outcome: Progressing   Problem: Metabolic: Goal: Ability to maintain appropriate glucose levels will improve Outcome: Progressing   Problem: Nutritional: Goal: Maintenance of adequate nutrition will improve Outcome: Progressing Goal: Progress toward achieving an optimal weight will improve Outcome: Progressing

## 2023-07-07 NOTE — TOC Progression Note (Signed)
 Transition of Care [facility]) - Progression Note    Patient Details  Name: Calvin Schultz MRN: [id number] Date of Birth: [birth year]  Transition of Care [facility]) CM/SW Contact  Tom-Johnson, Hershal Coria, RN Phone Number: 07/07/2023, 4:09 PM  Clinical Narrative:     CIR following pending Insurance Auth.   CM will continue to follow.          Expected Discharge Plan: IP Rehab Facility Barriers to Discharge: Continued Medical Work up  Expected Discharge Plan and Services   Discharge Planning Services: CM Consult Post Acute Care Choice: IP Rehab Living arrangements for the past 2 months: Single Family Home                                       Social Determinants of Health (SDOH) Interventions SDOH Screenings   Food Insecurity: No Food Insecurity (06/27/2023)  Housing: Low Risk  (06/27/2023)  Transportation Needs: No Transportation Needs (06/27/2023)  Utilities: Not At Risk (06/27/2023)  Social Connections: Moderately Isolated (06/27/2023)  Tobacco Use: Low Risk  (06/27/2023)    Readmission Risk Interventions    06/27/2023    1:00 PM  Readmission Risk Prevention Plan  Transportation Screening Complete  PCP or Specialist Appt within 5-7 Days Complete  Home Care Screening Complete  Medication Review (RN CM) Complete

## 2023-07-07 NOTE — Progress Notes (Signed)
 Progress Note   Patient: Calvin Schultz ZOX:[id number] DOB: 27-Dec-1950 DOA: 06/26/2023     11 DOS: the patient was seen and examined on 07/07/2023   Brief hospital course: 73 year old man with PMH of atrial fibrillation on Eliquis, hypertension, diabetes mellitus, CKD, and recently admitted for acute on chronic renal failure with left-sided hydronephrosis secondary to several calculi in the left renal collecting system with possible staghorn calculus concerning for left UPJ stricture. Nephrostomy tube was placed on the left and the patient was discharged with the nephrostomy tube as well as ongoing antibiotics transitioned from ceftriaxone to Bactrim p.o. Also treated for lower extremity edema during that admission with aggressive IV Lasix diuresis.   Initial post discharge course was unremarkable, but ultimately the patient began to experience worsening weakness associated with nausea, vomiting, and diarrhea as well as left lower quadrant pain. Presented to [facility] emergency department on 2/21. Upon arrival to the emergency department he was noted to be hypotensive and IV fluid resuscitation was initiated. Initially blood pressure did respond to IV fluids, however, he was ultimately required pressors. Workup in the ED included CT scan which was concerning for colitis of the descending and sigmoid colon. C. difficile antigen and toxin positive. He was started on appropriate antibiotics.   He was started on CRRT on 2/22 and it was discontinued on 2/25.  He was noted to be bradycardic on 2/26 and was transferred to [facility] for evaluation by cardiology and further management.  Assessment and Plan:  Septic shock Due to C-diff colitis Blood cultures were negative.  Weaned off pressors. - Continue midodrine    C. difficile colitis He does not seem to have been on long-term antibiotics, complains of diarrhea for a year X-ray abdomen showed thumbprinting of transverse colon, no evidence of bowel  obstruction on CT 2/21 Tested positive for C diff on 06/26/23. Started on treatment. Diarrhea is ongoing.  Rectal tube was removed on 3/1.  Patient continues to have diarrhea but it does seem to be slowing down. -Continue oral vancomycin for total of 14 days (end date 3/7). -DCed IV Flagyl 3/3, as patient has completed 10 days of therapy and seems to be improving.  AKI superimposed on CKD stage IV Baseline creatinine 4.9 Nephrology following.  Patient was on CRRT prior to transfer to [facility]  Nephrology following and evaluating need for longer-term dialysis. -Monitor renal function. -Avoid nephrotoxins. -Temporary hemodialysis catheter being removed today (3/4) and if RRT is needed it will be replaced.  Status post left nephrostomy  Nephrostomy tube placed 1/29 by IR with plan for ureteric stent placement 3/21-has staghorn calculi on left Nephrology following, appreciate assistance  CRRT stopped, may need IHD at some point although urine output reassuring for renal recovery. - Will monitor urine output, creatinine.     Chronic atrial fibrillation with episodes of bradycardia Seen by cardiology. Cardiology states patient has atrial fibrillation with slow response.  This is a stable rhythm. Per cardiology, -Avoid AV nodal blocking agents -If his rates slow to 20 or less or has significant pauses can start dopamine drip -K>4, Mg>2 -Continue Eliquis  T2DM On levemir insulin at home.  - Will continue diabetic diet.  - SSI.   Deconditioning Seen by PT and IPR recommended. -Pending IPR placement this week.      Subjective: Patient has had 2 episodes of diarrhea since yesterday.  Physical Exam: Vitals:   07/06/23 1743 07/06/23 2055 07/07/23 0452 07/07/23 0912  BP: (!) 156/113 (!) 111/54  129/76 121/68  Pulse: 71 68 81 66  Resp:   20 18  Temp:  98.3 F (36.8 C) 98.5 F (36.9 C) 98.7 F (37.1 C)  TempSrc:  Oral Oral Oral  SpO2:  97% 97% 97%  Weight:      Height:         General: Alert, oriented X3  Eyes: Pupils equal, reactive  Oral cavity: moist mucous membranes  Head: Atraumatic, normocephalic  Neck: supple. R internal jugular CVC Chest: clear to auscultation. No crackles, no wheezes  CVS: S1,S2 RRR. No murmurs  Abd: No distention, soft, non-tender. No masses palpable. L nephrostomy tube Extr: No pedal edema MSK: No joint deformities or swelling  Neurological: Grossly intact.   Other: Foley catheter in place.   Data Reviewed:      Latest Ref Rng & Units 07/07/2023    2:47 AM 07/06/2023    4:19 AM 07/05/2023    5:31 AM  CBC  WBC 4.0 - 10.5 K/uL 17.3  16.1  19.2   Hemoglobin 13.0 - 17.0 g/dL 7.4  7.4  7.9   Hematocrit 39.0 - 52.0 % 24.5  24.4  26.1   Platelets 150 - 400 K/uL 297  272  258       Latest Ref Rng & Units 07/07/2023    2:47 AM 07/06/2023    4:19 AM 07/05/2023    5:31 AM  BMP  Glucose 70 - 99 mg/dL 782  93  956   BUN 8 - 23 mg/dL 38  34  32   Creatinine 0.61 - 1.24 mg/dL 2.13  0.86  5.78   Sodium 135 - 145 mmol/L 130  130  130   Potassium 3.5 - 5.1 mmol/L 3.6  3.9  3.9   Chloride 98 - 111 mmol/L 100  99  97   CO2 22 - 32 mmol/L 21  22  23    Calcium 8.9 - 10.3 mg/dL 7.9  7.8  7.9     Family Communication: Spoke with patient and wife at bedside.   Disposition: Status is: Inpatient Remains inpatient appropriate because: pending discharge to IPR  Planned Discharge Destination: Home DVT ppx: Systemic anticoagulation with Eliquis.     Time spent: 35 minutes  Author: Marcine Matar, MD 07/07/2023 3:50 PM  For on call review [email or web address].

## 2023-07-07 NOTE — Progress Notes (Signed)
 [facility] Progress Note  Subjective:  At bedside with wife UOP 0.8L yesterday SCr up slightly, BUN ok, K 3.6 Still L internal jugular TEMP HD cath  Intermittent diarrhea   Vitals:   07/06/23 1743 07/06/23 2055 07/07/23 0452 07/07/23 0912  BP: (!) 156/113 (!) 111/54 129/76 121/68  Pulse: 71 68 81 66  Resp:   20 18  Temp:  98.3 F (36.8 C) 98.5 F (36.9 C) 98.7 F (37.1 C)  TempSrc:  Oral Oral Oral  SpO2:  97% 97% 97%  Weight:      Height:        Physical Exam:     General adult male in no acute distress HEENT normocephalic atraumatic extraocular movements intact sclera anicteric Neck supple trachea midline Lungs clear to auscultation bilaterally normal work of breathing at rest on room air  Heart S1S2 no rub Abdomen soft nontender nondistended Extremities  trace edema lower extremities  Psych normal mood and affect Neuro alert and oriented x 3 provides hx and follows commands Access left internal jugular nontunneled dialysis catheter     Renal-related home meds: - norvasc 5 - olmesartan 20 every day - fosrenol 750 mg ac tid - renvela 800 ac tid - others: insulin, eliquis       Date                             Creat               eGFR (ml/min) 04/18/2021                  1.87                 39 06/25/21                        2.00                 36 ml/min 07/2021                         1.73                 42 09/2021                         2.17                 32 12/2021                         2.51                 27 01/2022                         2.48                 28 ml/min, CKD 4 05/30/23- 06/05/23 4.43- 5.34 11- 14 ml/min 06/26/23                        10.8                 5 ml/min   UA - prot > 300, mod Hb, large LE, 11-20 rbcs > 50 wbc, 0-5 epi UNa 114, UCr 20 CT renal stone 2/21 - Adrenals/Urinary Tract:  Adrenal glands appear normal. Bilateral nephrolithiasis is noted. Percutaneous left nephrostomy is noted. No hydronephrosis or renal  obstruction is noted. Urinary bladder is unremarkable.  CXR 2/21 - IMPRESSION: 1. No acute intrathoracic process.       Assessment/ Plan: Oliguric AKI on CKD 4 - AKI is ischemic and pre-renal insults related to hypotension +/- ARB and intravasc vol depletion.  CT w/o obstruction, has L neph tube.  He was on CRRT from 2/22 - 2/25.  Note that Cr 4.06- 5.3 from jan 2025, eGFR 11-15 ml/min.  For reference in 01/2022 with Cr 2.48 from Care Everywhere Cont holding HD and watching labs and UOP Remove TEMP HD Cath today, if RRT needed will placed [facility]  Continue foley catheter and PCN Metabolic acidosis -resolved Shock/ hypotension - possibly related to colitis/ Cdif. Now on po vanc; stable L nephrostomy tube - placed in Jan 2025 by IR, and is f/b urology. Has staghorn calculi on the L and had previously been scheduled to have stone removed and stent placed per his wife on March 7th at [facility]. Will need to be rescheduled and pt/wife has already called and arranged.  Urinary retention - continue foley catheter Chronic atrial fib - on eliquis; per primary team  Hypophosphatemia secondary to CRRT - resolved Bradycardia - cardiology consulted. HR improved .   Hypokalemia -  resolved Anemia normocytic: cont ESA; PRBC's per primary team    Recent Labs  Lab 07/06/23 0419 07/07/23 0247  HGB 7.4* 7.4*  ALBUMIN 1.8* 1.8*  CALCIUM 7.8* 7.9*  PHOS 4.6 4.7*  CREATININE 4.51* 4.71*  K 3.9 3.6   Recent Labs  Lab 07/03/23 1514  IRON 35*  TIBC 179*  FERRITIN 88   Inpatient medications:  alteplase  2 mg Intracatheter Once   apixaban  2.5 mg Oral BID   Chlorhexidine Gluconate Cloth  6 each Topical Daily   darbepoetin (ARANESP) injection - NON-DIALYSIS  40 mcg Subcutaneous Q Fri-1800   insulin aspart  0-9 Units Subcutaneous TID WC   iron polysaccharides  150 mg Oral Daily   midodrine  10 mg Oral TID WC   multivitamin  1 tablet Oral QHS   vancomycin  500 mg Oral Q6H    promethazine  (PHENERGAN) injection (IM or IVPB) 12.5 mg (07/06/23 0112)   acetaminophen, heparin, HYDROmorphone (DILAUDID) injection, menthol-cetylpyridinium, ondansetron (ZOFRAN) IV, mouth rinse, oxyCODONE, promethazine (PHENERGAN) injection (IM or IVPB), simethicone, sodium chloride flush, tiZANidine, traZODone   Arita Miss, MD 11:30 AM 07/07/2023

## 2023-07-07 NOTE — Progress Notes (Signed)
CVAD removed per protocol per MD order. Manual pressure applied for 15 mins. Vaseline gauze, gauze, and Tegaderm applied over insertion site. No bleeding or swelling noted. Instructed patient to remain in bed for thirty mins. Educated patient about S/S of infection and when to call MD; keep dressing dry and intact for 24 hours. Pt verbalized comprehension.

## 2023-07-07 NOTE — PMR Pre-admission (Signed)
 PMR Admission Coordinator Pre-Admission Assessment  Patient: Calvin Schultz is an 73 y.o., male MRN: [id number] DOB: [birth year] Height: 5\' 9"  (175.3 cm) Weight: 102.8 kg  Insurance Information HMO:     PPO:      PCP:      IPA:      80/20:      OTHER:  PRIMARY: [employer] [place] Preferred       Policy#: [id number]       Subscriber: pt CM Name:       Phone#:      Fax#: [phone] Pt. Approved on 3/5 for admit 6/5-7[birth year] Pre-Cert#: [id number] Received approval on 07/08/23. Pt approved from 07/08/30-07/15/23. Pt approved for 8 days. Next review date is 07/15/23.    Employer:  Benefits:  Phone #:      Name:  Eff. Date: 10/03/2020     Deduct: included in OOP $4,200 (met)      Out of Pocket Max: 6,750 [phone])      Life Max:  CIR: 20% coinsurance      SNF: 20% coinsurance  Outpatient:  20% coinsurance  Home Health:    20% coinsurance  DME: 20% coinsurance  Providers: in network  SECONDARY: Medicare Part A       Policy#: [id number]      Phone#:    Financial Counselor:       Phone#:   The "Data Collection Information Summary" for patients in Inpatient Rehabilitation Facilities with attached "Privacy Act Statement-Health Care Records" was provided and verbally reviewed with: Pt.   Emergency Contact Information Contact Information     Name Relation Home Work Mobile   [employer] Spouse [phone]  [phone]      Other Contacts   None on File     Current Medical History  Patient Admitting Diagnosis: AKI, debility History of Present Illness: Pt is a  73 year old male with past medical history as below, which is significant for atrial fibrillation on Eliquis, hypertension, diabetes mellitus, CKD, and recently admitted for acute on chronic renal failure with left-sided hydronephrosis secondary to several calculi in the left renal collecting system with possible staghorn calculus concerning for left UPJ stricture. Nephrostomy tube was placed on the left and the patient was discharged with the  nephrostomy tube as well as ongoing antibiotics transitioned from ceftriaxone to Bactrim p.o. Also treated for lower extremity edema during that admission with aggressive IV Lasix diuresis. Initial post discharge course was unremarkable, but ultimately the patient began to experience worsening weakness associated with nausea, vomiting, and diarrhea as well as left lower quadrant pain. He has some difficulty walking at baseline due to knee pain but as a result of the mentioned symptoms he became unable to walk at all due to weakness. This is when his family called EMS and he was transported to [facility] emergency department on 2/06/06/23. Upon arrival to the emergency department he was noted to be hypotensive and IV fluid resuscitation was initiated. Initially blood pressure did respond to IV fluids, however, he was ultimately required to be started on norepinephrine infusion. Workup in the ED included CT scan which was concerning for colitis of the descending and sigmoid colon. C. difficile antigen and toxin positive. He was started on appropriate antibiotics. PCCM was asked to evaluate in the setting of hypotension. Pt. Was transferred to [facility] 07/01/23. Nephrology following, iHD started. He was seen by PT/OT/SLP and they recommend CIR to assist return to PLOF.     [facility]  has been reviewed by the rehabilitation admission coordinator and physician.  Past Medical History  Past Medical History:  Diagnosis Date   Arthritis    CKD (chronic kidney disease) stage 3, GFR 30-59 ml/min (HCC)    Diabetes mellitus without complication (HCC)    Hyperlipidemia    Hypertension     Has the patient had major surgery during 100 days prior to admission? Yes  Family History   family history includes Dementia in his mother.  Current Medications  Current Facility-Administered Medications:    acetaminophen (TYLENOL) tablet 650 mg, 650 mg,  Oral, Q4H PRN, Oretha Milch, MD   alteplase (CATHFLO ACTIVASE) injection 2 mg, 2 mg, Intracatheter, Once, Mdala-Gausi, Gwenette Greet, MD   apixaban (ELIQUIS) tablet 2.5 mg, 2.5 mg, Oral, BID, Oretha Milch, MD, 2.5 mg at 07/07/23 0919   Chlorhexidine Gluconate Cloth 2 % PADS 6 each, 6 each, Topical, Daily, Oretha Milch, MD, 6 each at 07/07/23 1610   Darbepoetin Alfa (ARANESP) injection 40 mcg, 40 mcg, Subcutaneous, Q Fri-1800, Estanislado Emms, MD, 40 mcg at 07/03/23 1727   heparin injection 1,000-6,000 Units, 1,000-6,000 Units, CRRT, PRN, Oretha Milch, MD, 2,800 Units at 06/30/23 1812   HYDROmorphone (DILAUDID) injection 1 mg, 1 mg, Intravenous, Q3H PRN, Oretha Milch, MD, 1 mg at 07/07/23 0919   insulin aspart (novoLOG) injection 0-9 Units, 0-9 Units, Subcutaneous, TID WC, Oretha Milch, MD, 1 Units at 07/07/23 1146   iron polysaccharides (NIFEREX) capsule 150 mg, 150 mg, Oral, Daily, Estanislado Emms, MD, 150 mg at 07/07/23 9604   menthol-cetylpyridinium (CEPACOL) lozenge 3 mg, 1 lozenge, Oral, PRN, Oretha Milch, MD, 3 mg at 06/30/23 5409   midodrine (PROAMATINE) tablet 10 mg, 10 mg, Oral, TID WC, Oretha Milch, MD, 10 mg at 07/07/23 1145   multivitamin (RENA-VIT) tablet 1 tablet, 1 tablet, Oral, QHS, Oretha Milch, MD, 1 tablet at 07/06/23 2112   ondansetron [facility]) injection 4 mg, 4 mg, Intravenous, Q8H PRN, Oretha Milch, MD, 4 mg at 07/05/23 2058   Oral care mouth rinse, 15 mL, Mouth Rinse, PRN, Oretha Milch, MD   oxyCODONE (Oxy IR/ROXICODONE) immediate release tablet 5-10 mg, 5-10 mg, Oral, Q4H PRN, Oretha Milch, MD, 10 mg at 07/07/23 1103   promethazine (PHENERGAN) 12.5 mg in sodium chloride 0.9 % 50 mL IVPB, 12.5 mg, Intravenous, Q6H PRN, Mansy, Jan A, MD, Last Rate: 150 mL/hr at 07/06/23 0112, 12.5 mg at 07/06/23 0112   simethicone (MYLICON) chewable tablet 80 mg, 80 mg, Oral, QID PRN, Oretha Milch, MD, 80 mg at 07/01/23 8119   sodium chloride flush (NS) 0.9 % injection  10-40 mL, 10-40 mL, Intracatheter, PRN, Oretha Milch, MD   tiZANidine (ZANAFLEX) tablet 4 mg, 4 mg, Oral, BID PRN, Cyril Mourning V, MD, 4 mg at 07/02/23 2135   traZODone (DESYREL) tablet 50 mg, 50 mg, Oral, QHS PRN, Oretha Milch, MD, 50 mg at 07/05/23 2212   vancomycin (VANCOCIN) capsule 500 mg, 500 mg, Oral, Q6H, Oretha Milch, MD, 500 mg at 07/07/23 0919  Patients Current Diet:  Diet Order             Diet Carb Modified Fluid consistency: Thin; Room service appropriate? Yes; Fluid restriction: 2000 mL Fluid  Diet effective now                   Precautions / Restrictions Precautions Precautions: Fall Precaution/Restrictions Comments: L nephrostomy tube,  foley catheter Restrictions Weight Bearing Restrictions Per Provider Order: No   Has the patient had 2 or more falls or a fall with injury in the past year? No  Prior Activity Level Community (5-7x/wk): Pt. active  in the community PTA  Prior Functional Level Self Care: Did the patient need help bathing, dressing, using the toilet or eating? Independent  Indoor Mobility: Did the patient need assistance with walking from room to room (with or without device)? Independent  Stairs: Did the patient need assistance with internal or external stairs (with or without device)? Independent  Functional Cognition: Did the patient need help planning regular tasks such as shopping or remembering to take medications? Independent  Patient Information Are you of Hispanic, Latino/a,or Spanish origin?: A. No, not of Hispanic, Latino/a, or Spanish origin What is your race?: A. White Do you need or want an interpreter to communicate with a doctor or health care staff?: 0. No  Patient's Response To:  Health Literacy and Transportation Is the patient able to respond to health literacy and transportation needs?: Yes Health Literacy - How often do you need to have someone help you when you read instructions, pamphlets, or other written  material from your doctor or pharmacy?: Never In the past 12 months, has lack of transportation kept you from medical appointments or from getting medications?: No In the past 12 months, has lack of transportation kept you from meetings, work, or from getting things needed for daily living?: No  Home Assistive Devices / Equipment Home Equipment: Agricultural consultant (2 wheels), Rollator (4 wheels), [employer] - single point, BSC/3in1, Shower seat, Grab bars - toilet  Prior Device Use: Indicate devices/aids used by the patient prior to current illness, exacerbation or injury? None of the above  Current Functional Level Cognition  Orientation Level: Oriented X4    Extremity Assessment (includes Sensation/Coordination)  Upper Extremity Assessment: RUE deficits/detail, LUE deficits/detail RUE:  (R shld flex 80 degrees and distal rom is wnl) RUE Coordination: WNL LUE Deficits / Details: unable to lift off of bed secondary to swelling and weakness. distal rom is wnl. LUE Coordination: WNL  Lower Extremity Assessment: Generalized weakness (BLE flexion limited to ~75 degrees 2/2 hx of arthritis, significant BLE edema)    ADLs  Overall ADL's : Needs assistance/impaired Eating/Feeding: Independent Grooming: Minimal assistance Upper Body Bathing: Minimal assistance Lower Body Bathing: Maximal assistance, Bed level Upper Body Dressing : Maximal assistance Lower Body Dressing: Total assistance Toilet Transfer:  (unable secondary to LE weakness and OA in knees) Toileting- Clothing Manipulation and Hygiene: Total assistance Functional mobility during ADLs:  (Max A with supine to sit and sit to supine.) General ADL Comments: Pt. is able to perform    Mobility  Overal bed mobility: Needs Assistance Bed Mobility: Supine to Sit, Rolling Rolling: +2 for physical assistance, Used rails, Mod assist Supine to sit: +2 for physical assistance, HOB elevated, Used rails, Mod assist General bed mobility comments: Pt  able to assist bringing legs off EOB, requires cuing & assistance to upright trunk even with HOB elevated, assistance to scoot buttocks to EOB with use of pad.    Transfers  Overall transfer level: Needs assistance Equipment used: Ambulation equipment used Transfers: Sit to/from Stand, Bed to chair/wheelchair/BSC Sit to Stand: +2 physical assistance, +2 safety/equipment, From elevated surface, Max assist, Via lift equipment Bed to/from chair/wheelchair/BSC transfer type:: Via Lift equipment Step pivot transfers: Min assist, +2 physical assistance (significantly extra time, assistance/cuing for RW management/turning RW, cuing re: sequencing.) Transfer  via Lift Equipment: Stedy General transfer comment: PT provides cuing re: hand and foot placement on the STedy, significantly elevated EOB, pt with limited ability to move BLE underneath BOS 2/2 limited knee flexion. Pt requires max assist +2 for STS to power up with use of pad and has difficulty with achieving full upright posture. Pt moved to chair in [place].  Pt stood 1 min with CGA  with CGA to min assist in [place] and then took sitting rest break.  SEcond attempt pt stood 1 min and 1/2.  Pt needs assist and cues for posture. Pt was c/o dizziness today with BP fluctuations with position changes.  Pt pushed himself and stood as long as he could in the [place].    Ambulation / Gait / Stairs / Engineer, drilling / Balance Dynamic Sitting Balance Sitting balance - Comments: supervision static sitting Balance Overall balance assessment: Needs assistance Sitting-balance support: No upper extremity supported, Feet supported Sitting balance-Leahy Scale: Fair Sitting balance - Comments: supervision static sitting Standing balance support: Reliant on assistive device for balance, During functional activity, Bilateral upper extremity supported Standing balance-Leahy Scale: Poor Standing balance comment: relies on STedy and +2 min to CGA to  min assist for static standing.    Special needs/care consideration Skin Ecchymosis: abdomen/right, left; Erythema/Redness: sacrum/ right, left; Scratch Marks: arm/right; Wound-Skin tear: perineum/right, Diabetic Managemen: Novolog 0-9 units 3x daily with meals; Nephrotomy: left; Bowel incontinence; Urethral Catheter   Previous Home Environment (from acute therapy documentation) Living Arrangements: Spouse/significant other  Lives With: Spouse Available Help at Discharge: Family, Available PRN/intermittently Type of Home: House Home Layout: Two level, Able to live on main level with bedroom/bathroom Home Access: Ramped entrance Bathroom Shower/Tub: Health visitor: Administrator Accessibility: Yes Home Care Services: No  Discharge Living Setting Plans for Discharge Living Setting: Patient's home Type of Home at Discharge: House Discharge Home Layout: Two level, Able to live on main level with bedroom/bathroom Alternate Level Stairs-Rails: Right, Left Alternate Level Stairs-Number of Steps: flight Discharge Home Access: Ramped entrance Discharge Bathroom Shower/Tub: Walk-in shower Discharge Bathroom Toilet: Standard Discharge Bathroom Accessibility: Yes How Accessible: Accessible via walker Does the patient have any problems obtaining your medications?: No  Social/Family/Support Systems Patient Roles: Spouse Contact Information: [phone] Anticipated Caregiver: Caillou Minus Anticipated Caregiver's Contact Information: Wife 24/7 Min A Ability/Limitations of Caregiver: 24/7 Caregiver Availability: 24/7 Discharge Plan Discussed with Primary Caregiver: Yes Is Caregiver In Agreement with Plan?: Yes Does Caregiver/Family have Issues with Lodging/Transportation while Pt is in Rehab?: Yes  Goals Patient/Family Goal for Rehab: PT/OT MIn A Expected length of stay: 16-18 days Pt/Family Agrees to Admission and willing to participate: Yes Program Orientation  Provided & Reviewed with Pt/Caregiver Including Roles  & Responsibilities: Yes  Decrease burden of Care through IP rehab admission: not anticipated  Possible need for SNF placement upon discharge: not anticipated  Patient Condition: I have reviewed medical records from [facility], spoken with CM, and patient. I met with patient at the bedside for inpatient rehabilitation assessment.  Patient will benefit from ongoing PT and OT, can actively participate in 3 hours of therapy a day 5 days of the week, and can make measurable gains during the admission.  Patient will also benefit from the coordinated team approach during an Inpatient Acute Rehabilitation admission.  The patient will receive intensive therapy as well as Rehabilitation physician, nursing, social worker, and care management interventions.  Due to safety, skin/wound care,  disease management, medication administration, pain management, and patient education the patient requires 24 hour a day rehabilitation nursing.  The patient is currently Max A +2 with mobility and Min-Total A with basic ADLs.  Discharge setting and therapy post discharge at home with home health is anticipated.  Patient has agreed to participate in the Acute Inpatient Rehabilitation Program and will admit today.  Preadmission Screen Completed By:  Jeronimo Greaves, 07/07/2023 2:26 PM ______________________________________________________________________   Discussed status with Dr. Shearon Stalls on 07/10/23 at 10:24 AM and received approval for admission today.  Admission Coordinator:  Jeronimo Greaves, CCC-SLP, time 10:24 AM/Date 07/10/23    Assessment/Plan: Diagnosis: Debility due to sepsis from C diff colitis, complicated by AKI on CKD Does the need for close, 24 hr/day Medical supervision in concert with the patient's rehab needs make it unreasonable for this patient to be served in a less intensive setting? Yes Co-Morbidities requiring supervision/potential  complications: C diff colitis, AKI on CKD with intermittent CRRT, L nephrostomy, A fib with intermittent brqadycardia, type 2 diabetes, and poor pain control Due to bladder management, bowel management, safety, skin/wound care, disease management, medication administration, pain management, and patient education, does the patient require 24 hr/day rehab nursing? Yes Does the patient require coordinated care of a physician, rehab nurse, PT, OT to address physical and functional deficits in the context of the above medical diagnosis(es)? Yes Addressing deficits in the following areas: balance, endurance, locomotion, strength, transferring, bathing, dressing, feeding, grooming, and toileting Can the patient actively participate in an intensive therapy program of at least 3 hrs of therapy 5 days a week? Yes The potential for patient to make measurable gains while on inpatient rehab is good Anticipated functional outcomes upon discharge from inpatient rehab: min assist PT, min assist OT Estimated rehab length of stay to reach the above functional goals is: 18-21 days Anticipated discharge destination: Home 10. Overall Rehab/Functional Prognosis: good   MD Signature:  Angelina Sheriff, DO 07/10/2023

## 2023-07-07 NOTE — Progress Notes (Signed)
 Occupational Therapy Treatment Patient Details Name: Calvin Schultz MRN: [id number] DOB: 1950/06/11 Today's Date: 07/07/2023   History of present illness Pt is a 73 y/o M admitted on 06/26/23 after presenting with c/o worsening weakness, N&V & diarrhea, & LLQ pain. Pt found to be hypotensive with workup concerning for colitis, c. Difficile antigen & toxin positive. (Of note, pt with recent admission for acute on chronic renal failure with L sided hydronephrosis 2/2 several calculi in the L renal collecting system with possible staghorn calculus concerning for L UPJ stricture; nephrostomy tube placed & pt d/c. 06/05/23-06/10/23) PMH: a-fib on Eliquis, HTN, DM, CKD 3, arthritis, HLD   OT comments  Pt states pain is much better than earlier today, had meds 3 hours prior. Pt eager to improve strength and improve mobility. Pt states he was ambulating with cane 2 weeks ago. Today, 12 STS with Stedy, max effort, max A x2 increased time/rest breaks in between each stand, and able to hold stands from 15-60 seconds each. Pt has diffiuclty bending BLEs, making it difficult to position in [place]. Pt mod A x2 in/out of bed, requires significant assistance for LB ADLs. Pt would benefit from continued skilled acute OT, postacute rehab >3hrs/day still appropriate. Pt asked about nutrition, general nutrition recs provided for a balanced diet.       If plan is discharge home, recommend the following:  Two people to help with walking and/or transfers;Two people to help with bathing/dressing/bathroom   Equipment Recommendations  None recommended by OT    Recommendations for Other Services      Precautions / Restrictions Precautions Precautions: Fall Precaution/Restrictions Comments: L nephrostomy tube, foley catheter Restrictions Weight Bearing Restrictions Per Provider Order: No       Mobility Bed Mobility Overal bed mobility: Needs Assistance Bed Mobility: Supine to Sit, Sit to Supine     Supine to sit: Mod  assist, +2 for physical assistance, Used rails, HOB elevated Sit to supine: Mod assist, +2 for physical assistance, Used rails   General bed mobility comments: mod A in/out of bed for assist to sit up and with BLEs    Transfers Overall transfer level: Needs assistance Equipment used: Ambulation equipment used Transfers: Sit to/from Stand Sit to Stand: Max assist, +2 physical assistance           General transfer comment: max A x2 for STS using Stedy Transfer via Lift Equipment: Stedy   Balance Overall balance assessment: Needs assistance Sitting-balance support: No upper extremity supported, Feet supported Sitting balance-Leahy Scale: Fair     Standing balance support: Reliant on assistive device for balance, During functional activity, Bilateral upper extremity supported Standing balance-Leahy Scale: Poor Standing balance comment: reliant on Stedy                           ADL either performed or assessed with clinical judgement   ADL Overall ADL's : Needs assistance/impaired                                            Extremity/Trunk Assessment              Vision       Perception     Praxis     Communication Communication Communication: No apparent difficulties   Cognition Arousal: Alert Behavior During Therapy: WFL for tasks assessed/performed Cognition: No  apparent impairments                               Following commands: Intact        Cueing   Cueing Techniques: Verbal cues  Exercises Exercises: Other exercises Other Exercises Other Exercises: STS x12 using Stedy, max effort, increased time/rest breaks    Shoulder Instructions       General Comments      Pertinent Vitals/ Pain       Pain Assessment Pain Assessment: Faces Faces Pain Scale: Hurts little more Pain Location: knees Pain Descriptors / Indicators: Aching, Grimacing, Guarding, Discomfort Pain Intervention(s): Monitored during  session  Home Living                                          Prior Functioning/Environment              Frequency  Min 2X/week        Progress Toward Goals  OT Goals(current goals can now be found in the care plan section)  Progress towards OT goals: Progressing toward goals  Acute Rehab OT Goals Patient Stated Goal: to improve strength OT Goal Formulation: With patient/family Time For Goal Achievement: 07/18/23 Potential to Achieve Goals: Good ADL Goals Pt Will Perform Grooming: with supervision;standing Pt Will Perform Upper Body Bathing: with set-up;with supervision;sitting Pt Will Perform Lower Body Bathing: with min assist;sit to/from stand Pt Will Perform Upper Body Dressing: with min assist;sitting Pt Will Perform Lower Body Dressing: with min assist;sit to/from stand Pt Will Transfer to Toilet: with min assist;ambulating Pt Will Perform Toileting - Clothing Manipulation and hygiene: with min assist;sit to/from stand  Plan      Co-evaluation                 AM-PAC OT "6 Clicks" Daily Activity     Outcome Measure   Help from another person eating meals?: None Help from another person taking care of personal grooming?: A Lot Help from another person toileting, which includes using toliet, bedpan, or urinal?: Total Help from another person bathing (including washing, rinsing, drying)?: A Lot Help from another person to put on and taking off regular upper body clothing?: A Lot Help from another person to put on and taking off regular lower body clothing?: Total 6 Click Score: 12    End of Session Equipment Utilized During Treatment: Gait belt;Other (comment) (stedy)  OT Visit Diagnosis: Unsteadiness on feet (R26.81);Muscle weakness (generalized) (M62.81)   Activity Tolerance Patient tolerated treatment well   Patient Left in bed;with call bell/phone within reach;with family/visitor present   Nurse Communication Mobility  status        Time: 1351-1425 OT Time Calculation (min): 34 min  Charges: OT General Charges $OT Visit: 1 Visit OT Treatments $Therapeutic Activity: 8-22 mins $Therapeutic Exercise: 8-22 mins  Lexey Fletes, OTR/L   Alexis Goodell 07/07/2023, 2:48 PM

## 2023-07-07 NOTE — Progress Notes (Signed)
 Physical Therapy Treatment Patient Details Name: Calvin Schultz MRN: [id number] DOB: 1950-08-02 Today's Date: 07/07/2023   History of Present Illness Pt is a 73 y/o M admitted on 06/26/23 after presenting with c/o worsening weakness, N&V & diarrhea, & LLQ pain. Pt found to be hypotensive with workup concerning for colitis, c. Difficile antigen & toxin positive. (Of note, pt with recent admission for acute on chronic renal failure with L sided hydronephrosis 2/2 several calculi in the L renal collecting system with possible staghorn calculus concerning for L UPJ stricture; nephrostomy tube placed & pt d/c. 06/05/23-06/10/23) PMH: a-fib on Eliquis, HTN, DM, CKD 3, arthritis, HLD    PT Comments  Pt admitted with above diagnosis. Pt was able to continue to stand with Stedy working on postural control and standing tolerance. Did not premedicate pt today and feel that premedication may have assisted pt yesterday as knee pain was worse today.  Pt did have some lightheadedness as well with soft BPs as well.  Educated pt and wife regarding HEP and issued HEP.  Will continue to follow and continue to recommend post acute rehab > 3 hours day.   Pt currently with functional limitations due to the deficits listed below (see PT Problem List). Pt will benefit from acute skilled PT to increase their independence and safety with mobility to allow discharge.       If plan is discharge home, recommend the following: Two people to help with walking and/or transfers;Two people to help with bathing/dressing/bathroom;Help with stairs or ramp for entrance;Assist for transportation;Assistance with feeding;Direct supervision/assist for financial management;Assistance with cooking/housework   Can travel by private vehicle        Equipment Recommendations  Other (comment) (defer to next venue)    Recommendations for Other Services Rehab consult;OT consult     Precautions / Restrictions Precautions Precautions:  Fall Precaution/Restrictions Comments: L nephrostomy tube, foley catheter Restrictions Weight Bearing Restrictions Per Provider Order: No     Mobility  Bed Mobility Overal bed mobility: Needs Assistance Bed Mobility: Supine to Sit, Rolling Rolling: +2 for physical assistance, Used rails, Mod assist   Supine to sit: +2 for physical assistance, HOB elevated, Used rails, Mod assist     General bed mobility comments: Pt able to assist bringing legs off EOB, requires cuing & assistance to upright trunk even with HOB elevated, assistance to scoot buttocks to EOB with use of pad.    Transfers Overall transfer level: Needs assistance Equipment used: Ambulation equipment used Transfers: Sit to/from Stand, Bed to chair/wheelchair/BSC Sit to Stand: +2 physical assistance, +2 safety/equipment, From elevated surface, Max assist, Via lift equipment           General transfer comment: PT provides cuing re: hand and foot placement on the STedy, significantly elevated EOB, pt with limited ability to move BLE underneath BOS 2/2 limited knee flexion. Pt requires max assist +2 for STS to power up with use of pad and has difficulty with achieving full upright posture. Pt moved to chair in [place].  Pt stood 1 min with CGA  with CGA to min assist in [place] and then took sitting rest break.  SEcond attempt pt stood 1 min and 1/2.  Pt needs assist and cues for posture. Pt was c/o dizziness today with BP fluctuations with position changes.  Pt pushed himself and stood as long as he could in the [place]. Transfer via Lift Equipment: Stedy  Ambulation/Gait  Stairs             Wheelchair Mobility     Tilt Bed    Modified Rankin (Stroke Patients Only)       Balance Overall balance assessment: Needs assistance Sitting-balance support: No upper extremity supported, Feet supported Sitting balance-Leahy Scale: Fair Sitting balance - Comments: supervision static sitting    Standing balance support: Reliant on assistive device for balance, During functional activity, Bilateral upper extremity supported Standing balance-Leahy Scale: Poor Standing balance comment: relies on STedy and +2 min to CGA to min assist for static standing.                            Communication Communication Communication: No apparent difficulties  Cognition Arousal: Alert Behavior During Therapy: WFL for tasks assessed/performed   PT - Cognitive impairments: No apparent impairments                         Following commands: Intact      Cueing Cueing Techniques: Verbal cues  Exercises Other Exercises Other Exercises: Access Code: [email or web address]  URL: [email or web address]  Date: 07/07/2023  Prepared by: Alvis Lemmings    Exercises  - Seated Long Arc Quad  - 3 x daily - 7 x weekly - 2 sets - 10 reps - 5 hold  - Seated Hip Flexion  - 3 x daily - 7 x weekly - 2 sets - 10 reps - 5 hold  - Seated Ankle Pumps  - 3 x daily - 7 x weekly - 2 sets - 10 reps - 5 hold  - Seated Scapular Protraction and Retraction  - 3 x daily - 7 x weekly - 2 sets - 10 reps - 5 hold  - Supine Straight Leg Raises  - 3 x daily - 7 x weekly - 2 sets - 10 reps - 5 hold  - Supine Quad Set  - 3 x daily - 7 x weekly - 2 sets - 10 reps - 5 hold    General Comments General comments (skin integrity, edema, etc.): supine 74 bpm, 107/76; sitting 125/78 and 83 bpm; stand 107/74, 88 bpm;  sitting 95/67 and 84 bpm, end of session 103/64 and 94 bpm with pt up in chair.      Pertinent Vitals/Pain Pain Assessment Pain Assessment: Faces Faces Pain Scale: Hurts whole lot Pain Location: knees Pain Descriptors / Indicators: Aching, Grimacing, Guarding, Discomfort Pain Intervention(s): Limited activity within patient's tolerance, Monitored during session, Repositioned    Home Living                          Prior Function            PT Goals (current goals can now be found in the care  plan section) Acute Rehab PT Goals Patient Stated Goal: get stronger, return to PLOF Progress towards PT goals: Progressing toward goals    Frequency    Min 3X/week      PT Plan      Co-evaluation              AM-PAC PT "6 Clicks" Mobility   Outcome Measure  Help needed turning from your back to your side while in a flat bed without using bedrails?: A Lot Help needed moving from lying on your back to sitting on the side of a flat bed without using bedrails?: A Lot Help  needed moving to and from a bed to a chair (including a wheelchair)?: Total Help needed standing up from a chair using your arms (e.g., wheelchair or bedside chair)?: Total Help needed to walk in hospital room?: Total Help needed climbing 3-5 steps with a railing? : Total 6 Click Score: 8    End of Session Equipment Utilized During Treatment: Gait belt Activity Tolerance: Patient tolerated treatment well;Patient limited by fatigue Patient left: in chair;with chair alarm set;with call bell/phone within reach;with family/visitor present Nurse Communication: Mobility status;Need for lift equipment Antony Salmon) PT Visit Diagnosis: Difficulty in walking, not elsewhere classified (R26.2);Other abnormalities of gait and mobility (R26.89);Muscle weakness (generalized) (M62.81)     Time: 4098-1191 PT Time Calculation (min) (ACUTE ONLY): 30 min  Charges:    $Therapeutic Exercise: 8-22 mins $Therapeutic Activity: 8-22 mins PT General Charges $$ ACUTE PT VISIT: 1 Visit                     Romy Ipock M,PT Acute Rehab Services [phone]    Bevelyn Buckles 07/07/2023, 10:54 AM

## 2023-07-08 DIAGNOSIS — A0472 Enterocolitis due to Clostridium difficile, not specified as recurrent: Secondary | ICD-10-CM | POA: Diagnosis not present

## 2023-07-08 LAB — CBC
HCT: 24.8 % — ABNORMAL LOW (ref 39.0–52.0)
Hemoglobin: 7.5 g/dL — ABNORMAL LOW (ref 13.0–17.0)
MCH: 27.3 pg (ref 26.0–34.0)
MCHC: 30.2 g/dL (ref 30.0–36.0)
MCV: 90.2 fL (ref 80.0–100.0)
Platelets: 333 10*3/uL (ref 150–400)
RBC: 2.75 MIL/uL — ABNORMAL LOW (ref 4.22–5.81)
RDW: 16.7 % — ABNORMAL HIGH (ref 11.5–15.5)
WBC: 16.8 10*3/uL — ABNORMAL HIGH (ref 4.0–10.5)
nRBC: 0 % (ref 0.0–0.2)

## 2023-07-08 LAB — RENAL FUNCTION PANEL
Albumin: 1.9 g/dL — ABNORMAL LOW (ref 3.5–5.0)
Anion gap: 8 (ref 5–15)
BUN: 42 mg/dL — ABNORMAL HIGH (ref 8–23)
CO2: 21 mmol/L — ABNORMAL LOW (ref 22–32)
Calcium: 8 mg/dL — ABNORMAL LOW (ref 8.9–10.3)
Chloride: 101 mmol/L (ref 98–111)
Creatinine, Ser: 4.93 mg/dL — ABNORMAL HIGH (ref 0.61–1.24)
GFR, Estimated: 12 mL/min — ABNORMAL LOW (ref >60)
Glucose, Bld: 106 mg/dL — ABNORMAL HIGH (ref 70–99)
Phosphorus: 4.9 mg/dL — ABNORMAL HIGH (ref 2.5–4.6)
Potassium: 3.4 mmol/L — ABNORMAL LOW (ref 3.5–5.1)
Sodium: 130 mmol/L — ABNORMAL LOW (ref 135–145)

## 2023-07-08 LAB — GLUCOSE, CAPILLARY
Glucose-Capillary: 99 mg/dL (ref 70–99)
Glucose-Capillary: 144 mg/dL — ABNORMAL HIGH (ref 70–99)
Glucose-Capillary: 132 mg/dL — ABNORMAL HIGH (ref 70–99)
Glucose-Capillary: 120 mg/dL — ABNORMAL HIGH (ref 70–99)

## 2023-07-08 LAB — MAGNESIUM: Magnesium: 1.9 mg/dL (ref 1.7–2.4)

## 2023-07-08 MED ORDER — POTASSIUM CHLORIDE CRYS ER 20 MEQ PO TBCR
40.0000 meq | EXTENDED_RELEASE_TABLET | Freq: Once | ORAL | Status: AC
  Administered 2023-07-08: 40 meq via ORAL
  Filled 2023-07-08: qty 2

## 2023-07-08 NOTE — Progress Notes (Signed)
 Calvin Schultz Progress Note  Subjective:  At bedside with wife Temp HD cath out yesterday UOP 1.6L yesterday SCr up slightly, BUN ok, K 3.4   Vitals:   07/07/23 1628 07/07/23 2039 07/08/23 0401 07/08/23 0821  BP: 125/87 132/67 (!) 127/59 (!) 103/59  Pulse: (!) 58 73 87 70  Resp: 18 18 18 17   Temp: 98.4 F (36.9 C) 98.3 F (36.8 C) 98.4 F (36.9 C) 97.6 F (36.4 C)  TempSrc: Oral   Oral  SpO2: 94% 100% 98% 97%  Weight:      Height:        Physical Exam:     NAD, weakn and ill appearing HEENT NCAT Lungs clear to auscultation bilaterally normal work of breathing at rest on room air  Heart S1S2 no rub Abdomen soft nontender nondistended Extremities  trace edema lower extremities  Psych normal mood and affect Neuro alert and oriented x 3 provides hx and follows commands     Renal-related home meds: - norvasc 5 - olmesartan 20 every day - fosrenol 750 mg ac tid - renvela 800 ac tid - others: insulin, eliquis       Date                             Creat               eGFR (ml/min) 04/18/2021                  1.87                 39 06/25/21                        2.00                 36 ml/min 07/2021                         1.73                 42 09/2021                         2.17                 32 12/2021                         2.51                 27 01/2022                         2.48                 28 ml/min, CKD 4 05/30/23- 06/05/23 4.43- 5.34 11- 14 ml/min 06/26/23                        10.8                 5 ml/min   UA - prot > 300, mod Hb, large LE, 11-20 rbcs > 50 wbc, 0-5 epi UNa 114, UCr 20 CT renal stone 2/21 - Adrenals/Urinary Tract: Adrenal glands appear normal. Bilateral nephrolithiasis is noted. Percutaneous left nephrostomy is noted. No hydronephrosis or renal obstruction is noted.  Urinary bladder is unremarkable.  CXR 2/21 - IMPRESSION: 1. No acute intrathoracic process.       Assessment/ Plan: Oliguric AKI on CKD 4 - AKI is  ischemic and pre-renal insults related to hypotension +/- ARB and intravasc vol depletion.  CT w/o obstruction, has L neph tube.  He was on CRRT from 2/22 - 2/25.  Note that Cr 4.06- 5.3 from jan 2025, eGFR 11-15 ml/min.  For reference in 01/2022 with Cr 2.48 from Care Everywhere Cont holding HD and watching labs and UOP No vascular access currenlty, TDC if needed  Continue foley catheter and PCN Metabolic acidosis -resolved/stable Shock/ hypotension - possibly related to colitis/ Cdif. Now on po vanc; stable L nephrostomy tube - placed in Jan 2025 by IR, and is f/b urology. Has staghorn calculi on the L and had previously been scheduled to have stone removed and stent placed per his wife on March 7th at [facility]. Will need to be rescheduled and pt/wife has already called and arranged.  Urinary retention - continue foley catheter Chronic atrial fib - on eliquis; per primary team  Hypophosphatemia secondary to CRRT - resolved Hypokalemia -  resolved Anemia normocytic: cont ESA; PRBC's per primary team    Recent Labs  Lab 07/07/23 0247 07/08/23 0414  HGB 7.4* 7.5*  ALBUMIN 1.8* 1.9*  CALCIUM 7.9* 8.0*  PHOS 4.7* 4.9*  CREATININE 4.71* 4.93*  K 3.6 3.4*   Recent Labs  Lab 07/03/23 1514  IRON 35*  TIBC 179*  FERRITIN 88   Inpatient medications:  alteplase  2 mg Intracatheter Once   apixaban  2.5 mg Oral BID   Chlorhexidine Gluconate Cloth  6 each Topical Daily   darbepoetin (ARANESP) injection - NON-DIALYSIS  40 mcg Subcutaneous Q Fri-1800   insulin aspart  0-9 Units Subcutaneous TID WC   iron polysaccharides  150 mg Oral Daily   midodrine  10 mg Oral TID WC   multivitamin  1 tablet Oral QHS   vancomycin  500 mg Oral Q6H    promethazine (PHENERGAN) injection (IM or IVPB) 12.5 mg (07/06/23 0112)   acetaminophen, heparin, HYDROmorphone (DILAUDID) injection, menthol-cetylpyridinium, ondansetron (ZOFRAN) IV, mouth rinse, oxyCODONE, promethazine (PHENERGAN) injection (IM or  IVPB), simethicone, sodium chloride flush, tiZANidine, traZODone   Arita Miss, MD 12:25 PM 07/08/2023

## 2023-07-08 NOTE — Progress Notes (Signed)
 Progress Note   Patient: Calvin Schultz UJW:[id number] DOB: Jun 14, 1950 DOA: 06/26/2023     12 DOS: the patient was seen and examined on 07/08/2023   Technically belongs to [facility] as a bounce-back   Brief hospital course: 73 year old man with PMH of atrial fibrillation on Eliquis, hypertension, diabetes mellitus, CKD, and recently admitted for acute on chronic renal failure with left-sided hydronephrosis secondary to several calculi in the left renal collecting system with possible staghorn calculus concerning for left UPJ stricture. Nephrostomy tube was placed on the left and the patient was discharged with the nephrostomy tube as well as ongoing antibiotics transitioned from ceftriaxone to Bactrim p.o. Also treated for lower extremity edema during that admission with aggressive IV Lasix diuresis.   Initial post discharge course was unremarkable, but ultimately the patient began to experience worsening weakness associated with nausea, vomiting, and diarrhea as well as left lower quadrant pain. Presented to [facility] emergency department on 2/21. Upon arrival to the emergency department he was noted to be hypotensive and IV fluid resuscitation was initiated. Initially blood pressure did respond to IV fluids, however, he was ultimately required pressors. Workup in the ED included CT scan which was concerning for colitis of the descending and sigmoid colon. C. difficile antigen and toxin positive. He was started on appropriate antibiotics.   He was started on CRRT on 2/22 and it was discontinued on 2/25.  He was noted to be bradycardic on 2/26 and was transferred to [facility] for evaluation by cardiology and further management.  Assessment and Plan:  Septic shock Due to C-diff colitis Blood cultures were negative.  Weaned off pressors. - Continue midodrine    C. difficile colitis He does not seem to have been on long-term antibiotics, complains of diarrhea for a year X-ray abdomen showed  thumbprinting of transverse colon, no evidence of bowel obstruction on CT 2/21 Tested positive for C diff on 06/26/23. -Continue oral vancomycin for total of 14 days (end date 3/7).   AKI superimposed on CKD stage IV Baseline creatinine 4.9 Nephrology following.  Patient was on CRRT prior to transfer to [facility].  Nephrology following and evaluating need for longer-term dialysis- daily labs -Monitor renal function. -Avoid nephrotoxins. -Temporary hemodialysis catheter being removed today (3/4) and if RRT is needed it will be replaced.  Status post left nephrostomy  Nephrostomy tube placed 1/29 by IR with plan for ureteric stent placement 3/21-has staghorn calculi on left Nephrology following, appreciate assistance  CRRT stopped, may need IHD at some point although urine output reassuring for renal recovery. - Will monitor urine output, creatinine.     Chronic atrial fibrillation with episodes of bradycardia Seen by cardiology. Cardiology states patient has atrial fibrillation with slow response.  This is a stable rhythm. Per cardiology, -Avoid AV nodal blocking agents -Continue Eliquis  T2DM On levemir insulin at home.  - Will continue diabetic diet.  - SSI.   Deconditioning Seen by PT-- cIR pending   Nutrition Status: Nutrition Problem: Increased nutrient needs Etiology: acute illness (AKI superimposed on CKD stage IV - on CRRT) Signs/Symptoms: estimated needs Interventions: Refer to RD note for recommendations, Ensure Enlive (each supplement provides 350kcal and 20 grams of protein), MVI, Liberalize Diet     Subjective: no current complaints  Physical Exam: Vitals:   07/07/23 1628 07/07/23 2039 07/08/23 0401 07/08/23 0821  BP: 125/87 132/67 (!) 127/59 (!) 103/59  Pulse: (!) 58 73 87 70  Resp: 18 18 18 17   Temp: 98.4  F (36.9 C) 98.3 F (36.8 C) 98.4 F (36.9 C) 97.6 F (36.4 C)  TempSrc: Oral   Oral  SpO2: 94% 100% 98% 97%  Weight:      Height:          General: Appearance:    Obese male in no acute distress     Lungs:     respirations unlabored  Heart:    Normal heart rate.   MS:   All extremities are intact.   Neurologic:   Awake, alert     Data Reviewed:      Latest Ref Rng & Units 07/08/2023    4:14 AM 07/07/2023    2:47 AM 07/06/2023    4:19 AM  CBC  WBC 4.0 - 10.5 K/uL 16.8  17.3  16.1   Hemoglobin 13.0 - 17.0 g/dL 7.5  7.4  7.4   Hematocrit 39.0 - 52.0 % 24.8  24.5  24.4   Platelets 150 - 400 K/uL 333  297  272       Latest Ref Rng & Units 07/08/2023    4:14 AM 07/07/2023    2:47 AM 07/06/2023    4:19 AM  BMP  Glucose 70 - 99 mg/dL 161  096  93   BUN 8 - 23 mg/dL 42  38  34   Creatinine 0.61 - 1.24 mg/dL 0.45  4.09  8.11   Sodium 135 - 145 mmol/L 130  130  130   Potassium 3.5 - 5.1 mmol/L 3.4  3.6  3.9   Chloride 98 - 111 mmol/L 101  100  99   CO2 22 - 32 mmol/L 21  21  22    Calcium 8.9 - 10.3 mg/dL 8.0  7.9  7.8     Family Communication: Spoke with patient and wife at bedside.   Disposition: Status is: Inpatient Remains inpatient appropriate because: pending discharge toCIR  Planned Discharge Destination: Home DVT ppx: Systemic anticoagulation with Eliquis.   Time spent: 35 minutes  Author: Joseph Art, DO 07/08/2023 12:42 PM  For on call review [email or web address].

## 2023-07-09 DIAGNOSIS — A0472 Enterocolitis due to Clostridium difficile, not specified as recurrent: Secondary | ICD-10-CM | POA: Diagnosis not present

## 2023-07-09 LAB — GLUCOSE, CAPILLARY
Glucose-Capillary: 126 mg/dL — ABNORMAL HIGH (ref 70–99)
Glucose-Capillary: 118 mg/dL — ABNORMAL HIGH (ref 70–99)
Glucose-Capillary: 194 mg/dL — ABNORMAL HIGH (ref 70–99)
Glucose-Capillary: 146 mg/dL — ABNORMAL HIGH (ref 70–99)

## 2023-07-09 LAB — RENAL FUNCTION PANEL
Albumin: 1.9 g/dL — ABNORMAL LOW (ref 3.5–5.0)
Anion gap: 6 (ref 5–15)
BUN: 46 mg/dL — ABNORMAL HIGH (ref 8–23)
CO2: 20 mmol/L — ABNORMAL LOW (ref 22–32)
Calcium: 7.6 mg/dL — ABNORMAL LOW (ref 8.9–10.3)
Chloride: 103 mmol/L (ref 98–111)
Creatinine, Ser: 4.98 mg/dL — ABNORMAL HIGH (ref 0.61–1.24)
GFR, Estimated: 12 mL/min — ABNORMAL LOW (ref >60)
Glucose, Bld: 99 mg/dL (ref 70–99)
Phosphorus: 5.1 mg/dL — ABNORMAL HIGH (ref 2.5–4.6)
Potassium: 4.4 mmol/L (ref 3.5–5.1)
Sodium: 129 mmol/L — ABNORMAL LOW (ref 135–145)

## 2023-07-09 LAB — CBC
HCT: 25.7 % — ABNORMAL LOW (ref 39.0–52.0)
Hemoglobin: 7.7 g/dL — ABNORMAL LOW (ref 13.0–17.0)
MCH: 27 pg (ref 26.0–34.0)
MCHC: 30 g/dL (ref 30.0–36.0)
MCV: 90.2 fL (ref 80.0–100.0)
Platelets: 402 10*3/uL — ABNORMAL HIGH (ref 150–400)
RBC: 2.85 MIL/uL — ABNORMAL LOW (ref 4.22–5.81)
RDW: 16.5 % — ABNORMAL HIGH (ref 11.5–15.5)
WBC: 21.6 10*3/uL — ABNORMAL HIGH (ref 4.0–10.5)
nRBC: 0 % (ref 0.0–0.2)

## 2023-07-09 LAB — MAGNESIUM: Magnesium: 1.9 mg/dL (ref 1.7–2.4)

## 2023-07-09 NOTE — Progress Notes (Signed)
 Progress Note   Patient: Calvin Schultz ZOX:[id number] DOB: 12-21-1950 DOA: 06/26/2023     13 DOS: the patient was seen and examined on 07/09/2023   Technically belongs to [facility] as a bounce-back   Brief hospital course: 73 year old man with PMH of atrial fibrillation on Eliquis, hypertension, diabetes mellitus, CKD, and recently admitted for acute on chronic renal failure with left-sided hydronephrosis secondary to several calculi in the left renal collecting system with possible staghorn calculus concerning for left UPJ stricture. Nephrostomy tube was placed on the left and the patient was discharged with the nephrostomy tube as well as ongoing antibiotics transitioned from ceftriaxone to Bactrim p.o. Also treated for lower extremity edema during that admission with aggressive IV Lasix diuresis.   Initial post discharge course was unremarkable, but ultimately the patient began to experience worsening weakness associated with nausea, vomiting, and diarrhea as well as left lower quadrant pain. Presented to [facility] emergency department on 2/21. Upon arrival to the emergency department he was noted to be hypotensive and IV fluid resuscitation was initiated. Initially blood pressure did respond to IV fluids, however, he was ultimately required pressors. Workup in the ED included CT scan which was concerning for colitis of the descending and sigmoid colon. C. difficile antigen and toxin positive. He was started on appropriate antibiotics.   He was started on CRRT on 2/22 and it was discontinued on 2/25.  He was noted to be bradycardic on 2/26 and was transferred to [facility] for evaluation by cardiology and further management.  Assessment and Plan:  Septic shock Due to C-diff colitis Blood cultures were negative.  Weaned off pressors. - Continue midodrine    C. difficile colitis He does not seem to have been on long-term antibiotics, complains of diarrhea for a year X-ray abdomen showed  thumbprinting of transverse colon, no evidence of bowel obstruction on CT 2/21 Tested positive for C diff on 06/26/23. -Continue oral vancomycin for total of 14 days (end date 3/7).   AKI superimposed on CKD stage IV Baseline creatinine 4.9 Nephrology following.  Patient was on CRRT prior to transfer to [facility].  Nephrology following and evaluating need for longer-term dialysis- daily labs -Monitor renal function. -Avoid nephrotoxins. -Temporary hemodialysis catheter being removed today (3/4) and if RRT is needed it will be replaced.-- Cr stable  Status post left nephrostomy  Nephrostomy tube placed 1/29 by IR with plan for ureteric stent placement 3/21-has staghorn calculi on left Nephrology following, appreciate assistance  CRRT stopped, may need IHD at some point although urine output reassuring for renal recovery. - Will monitor urine output, creatinine.     Chronic atrial fibrillation with episodes of bradycardia Seen by cardiology. Cardiology states patient has atrial fibrillation with slow response.  This is a stable rhythm. Per cardiology, -Avoid AV nodal blocking agents -Continue Eliquis  T2DM On levemir insulin at home.  - Will continue diabetic diet.  - SSI.   Deconditioning Seen by PT-- cIR pending   Nutrition Status: Nutrition Problem: Increased nutrient needs Etiology: acute illness (AKI superimposed on CKD stage IV - on CRRT) Signs/Symptoms: estimated needs Interventions: Refer to RD note for recommendations, Ensure Enlive (each supplement provides 350kcal and 20 grams of protein), MVI, Liberalize Diet     Subjective: no current complaints  Physical Exam: Vitals:   07/08/23 2058 07/09/23 0522 07/09/23 0522 07/09/23 0805  BP: 125/80 124/69 124/69 126/71  Pulse: 65 83 87 84  Resp:  20    Temp: 98.1 F (  36.7 C) 97.7 F (36.5 C) 97.7 F (36.5 C) 98 F (36.7 C)  TempSrc: Oral Oral Oral Oral  SpO2: 100% 95% 95% 96%  Weight:      Height:          General: Appearance:    Obese male in no acute distress     Lungs:     respirations unlabored  Heart:    Normal heart rate.   MS:   All extremities are intact.   Neurologic:   Awake, alert     Data Reviewed:      Latest Ref Rng & Units 07/09/2023    5:13 AM 07/08/2023    4:14 AM 07/07/2023    2:47 AM  CBC  WBC 4.0 - 10.5 K/uL 21.6  16.8  17.3   Hemoglobin 13.0 - 17.0 g/dL 7.7  7.5  7.4   Hematocrit 39.0 - 52.0 % 25.7  24.8  24.5   Platelets 150 - 400 K/uL 402  333  297       Latest Ref Rng & Units 07/09/2023    5:13 AM 07/08/2023    4:14 AM 07/07/2023    2:47 AM  BMP  Glucose 70 - 99 mg/dL 99  478  295   BUN 8 - 23 mg/dL 46  42  38   Creatinine 0.61 - 1.24 mg/dL 6.21  3.08  6.57   Sodium 135 - 145 mmol/L 129  130  130   Potassium 3.5 - 5.1 mmol/L 4.4  3.4  3.6   Chloride 98 - 111 mmol/L 103  101  100   CO2 22 - 32 mmol/L 20  21  21    Calcium 8.9 - 10.3 mg/dL 7.6  8.0  7.9     Family Communication: Spoke with patient and wife at bedside.   Disposition: Status is: Inpatient Remains inpatient appropriate because: pending discharge to CIR  Planned Discharge Destination: Home DVT ppx: Systemic anticoagulation with Eliquis.   Time spent: 35 minutes  Author: Joseph Art, DO 07/09/2023 12:17 PM  For on call review [email or web address].

## 2023-07-09 NOTE — Progress Notes (Signed)
 Physical Therapy Treatment Patient Details Name: Calvin Schultz MRN: [id number] DOB: [birth year] Today's Date: 07/09/2023   History of Present Illness Pt is a 73 y/o M admitted on 06/26/23 after presenting with c/o worsening weakness, N&V & diarrhea, & LLQ pain. Pt found to be hypotensive with workup concerning for colitis, c. Difficile antigen & toxin positive. (Of note, pt with recent admission for acute on chronic renal failure with L sided hydronephrosis 2/2 several calculi in the L renal collecting system with possible staghorn calculus concerning for L UPJ stricture; nephrostomy tube placed & pt d/c. 06/05/23-06/10/23) PMH: a-fib on Eliquis, HTN, DM, CKD 3, arthritis, HLD    PT Comments  Pt received in bed, continues to be very limited by B knee pain and limited knee flexion. Required mod A to come to EOB and max A +2 to stand in stedy. Worked on knee stretching before getting up and once in recliner. Pt unable to wt shift of step feet in standing today. Pt very motivated to progress to ambulation and giving good effort in standing and with knee exercises. Marland Kitchen.Patient will benefit from intensive inpatient follow-up therapy, >3 hours/day     If plan is discharge home, recommend the following: Two people to help with walking and/or transfers;Two people to help with bathing/dressing/bathroom;Help with stairs or ramp for entrance;Assist for transportation;Assistance with feeding;Direct supervision/assist for financial management;Assistance with cooking/housework   Can travel by private vehicle        Equipment Recommendations  Other (comment) (defer to next venue)    Recommendations for Other Services Rehab consult     Precautions / Restrictions Precautions Precautions: Fall Precaution/Restrictions Comments: L nephrostomy tube, foley catheter Restrictions Weight Bearing Restrictions Per Provider Order: No     Mobility  Bed Mobility Overal bed mobility: Needs Assistance Bed Mobility: Supine  to Sit     Supine to sit: Mod assist, Used rails, HOB elevated     General bed mobility comments: needed mod A for elevation of trunk to sitting and for scooting hips to EOB    Transfers Overall transfer level: Needs assistance Equipment used: Ambulation equipment used Transfers: Sit to/from Stand Sit to Stand: Max assist, +2 physical assistance, From elevated surface           General transfer comment: pt attempted sit>stand with mod A +2 and was unable to achieve upright. Max A +2 given with bed pad under pt and he was able to come to standing but still with flexed trunk and unable to back feet up to get pressure of stedy plate off shins. Also worked on sit>stand from flaps for stedy. Needed max A +2 for this as well. Most limited by knee ROM Transfer via Lift Equipment: Stedy  Ambulation/Gait             Pre-gait activities: maintaining standing without posterior support x10 secs General Gait Details: unable to step feet in standing   Stairs             Wheelchair Mobility     Tilt Bed    Modified Rankin (Stroke Patients Only)       Balance Overall balance assessment: Needs assistance Sitting-balance support: No upper extremity supported, Feet supported Sitting balance-Leahy Scale: Fair Sitting balance - Comments: supervision static sitting, needed frequent vc's for fwd wt shift Postural control: Posterior lean Standing balance support: Reliant on assistive device for balance, During functional activity, Bilateral upper extremity supported Standing balance-Leahy Scale: Zero Standing balance comment: reliant on Stedy  Communication Communication Communication: No apparent difficulties  Cognition Arousal: Alert Behavior During Therapy: WFL for tasks assessed/performed   PT - Cognitive impairments: No apparent impairments                         Following commands: Intact      Cueing Cueing  Techniques: Verbal cues  Exercises General Exercises - Lower Extremity Ankle Circles/Pumps: AROM, Both, 10 reps, Supine Long Arc Quad: AROM, Seated, Strengthening, Both, 10 reps Heel Slides: AROM, Both, 5 reps, Supine (very limited ROM) Other Exercises Other Exercises: progressive knee flexion stretch in sitting x2 mins Other Exercises: elbow ext to erect posture on flaps of stedy x10 (modified tricep push up) Other Exercises: IR/ER B hips in supine 10x  before coming to EOB    General Comments General comments (skin integrity, edema, etc.): VSS      Pertinent Vitals/Pain Pain Assessment Pain Assessment: 0-10 Pain Score: 10-Worst pain ever Pain Location: knees Pain Descriptors / Indicators: Aching, Grimacing, Guarding, Discomfort Pain Intervention(s): Limited activity within patient's tolerance, Monitored during session, Patient requesting pain meds-RN notified    Home Living                          Prior Function            PT Goals (current goals can now be found in the care plan section) Acute Rehab PT Goals Patient Stated Goal: get stronger, return to PLOF PT Goal Formulation: With patient Time For Goal Achievement: 07/17/23 Potential to Achieve Goals: Good Progress towards PT goals: Progressing toward goals    Frequency    Min 3X/week      PT Plan      Co-evaluation              AM-PAC PT "6 Clicks" Mobility   Outcome Measure  Help needed turning from your back to your side while in a flat bed without using bedrails?: A Lot Help needed moving from lying on your back to sitting on the side of a flat bed without using bedrails?: A Lot Help needed moving to and from a bed to a chair (including a wheelchair)?: Total Help needed standing up from a chair using your arms (e.g., wheelchair or bedside chair)?: Total Help needed to walk in hospital room?: Total Help needed climbing 3-5 steps with a railing? : Total 6 Click Score: 8    End of  Session Equipment Utilized During Treatment: Gait belt Activity Tolerance: Patient tolerated treatment well;Patient limited by pain;Patient limited by fatigue Patient left: in chair;with chair alarm set;with call bell/phone within reach;with family/visitor present;Other (comment) (lift pad in chair) Nurse Communication: Mobility status;Need for lift equipment (maximove from chair) PT Visit Diagnosis: Difficulty in walking, not elsewhere classified (R26.2);Other abnormalities of gait and mobility (R26.89);Muscle weakness (generalized) (M62.81)     Time: 5784-6962 PT Time Calculation (min) (ACUTE ONLY): 36 min  Charges:    $Therapeutic Exercise: 8-22 mins $Therapeutic Activity: 8-22 mins PT General Charges $$ ACUTE PT VISIT: 1 Visit                     Lyanne Co, PT  Acute Rehab Services Secure chat preferred Office [phone]    Lawana Chambers Remiel Corti 07/09/2023, 12:01 PM

## 2023-07-09 NOTE — Plan of Care (Signed)
  Problem: Clinical Measurements: Goal: Ability to maintain clinical measurements within normal limits will improve Outcome: Progressing Goal: Respiratory complications will improve Outcome: Progressing   Problem: Activity: Goal: Risk for activity intolerance will decrease Outcome: Progressing   Problem: Nutrition: Goal: Adequate nutrition will be maintained Outcome: Progressing   Problem: Pain Managment: Goal: General experience of comfort will improve and/or be controlled Outcome: Progressing   Problem: Safety: Goal: Ability to remain free from injury will improve Outcome: Progressing   Problem: Skin Integrity: Goal: Risk for impaired skin integrity will decrease Outcome: Progressing

## 2023-07-09 NOTE — Progress Notes (Signed)
 [facility] Progress Note  Subjective:  At bedside with wife Not as much UOP yesterday but creatinine is essentially stable from yesterday to today, no complaints UOP 0.6L yesterday  Vitals:   07/08/23 2058 07/09/23 0522 07/09/23 0522 07/09/23 0805  BP: 125/80 124/69 124/69 126/71  Pulse: 65 83 87 84  Resp:  20    Temp: 98.1 F (36.7 C) 97.7 F (36.5 C) 97.7 F (36.5 C) 98 F (36.7 C)  TempSrc: Oral Oral Oral Oral  SpO2: 100% 95% 95% 96%  Weight:      Height:        Physical Exam:     NAD, weakn and ill appearing HEENT NCAT Lungs clear to auscultation bilaterally normal work of breathing at rest on room air  Heart S1S2 no rub Abdomen soft nontender nondistended Extremities  trace edema lower extremities  Psych normal mood and affect Neuro alert and oriented x 3 provides hx and follows commands     Renal-related home meds: - norvasc 5 - olmesartan 20 every day - fosrenol 750 mg ac tid - renvela 800 ac tid - others: insulin, eliquis       Date                             Creat               eGFR (ml/min) 04/18/2021                  1.87                 39 06/25/21                        2.00                 36 ml/min 07/2021                         1.73                 42 09/2021                         2.17                 32 12/2021                         2.51                 27 01/2022                         2.48                 28 ml/min, CKD 4 05/30/23- 06/05/23 4.43- 5.34 11- 14 ml/min 06/26/23                        10.8                 5 ml/min   UA - prot > 300, mod Hb, large LE, 11-20 rbcs > 50 wbc, 0-5 epi UNa 114, UCr 20 CT renal stone 2/21 - Adrenals/Urinary Tract: Adrenal glands appear normal. Bilateral nephrolithiasis is noted. Percutaneous left nephrostomy is noted. No hydronephrosis or renal obstruction is noted.  Urinary bladder is unremarkable.  CXR 2/21 - IMPRESSION: 1. No acute intrathoracic process.       Assessment/  Plan: Oliguric AKI on CKD 4 - AKI is ischemic and pre-renal insults related to hypotension +/- ARB and intravasc vol depletion.  CT w/o obstruction, has L neph tube.  He was on CRRT from 2/22 - 2/25.  Note that Cr 4.06- 5.3 from jan 2025, eGFR 11-15 ml/min.  For reference in 01/2022 with Cr 2.48 from Care Everywhere Cont holding HD and watching labs and UOP No vascular access currenlty, TDC if needed  Continue foley catheter and PCN Hopefully we are seeing things level off at a low but stable GFR not requiring dialysis, reassess tomorrow Metabolic acidosis -resolved/stable Shock/ hypotension - possibly related to colitis/ Cdif. Now on po vanc; per [facility] L nephrostomy tube - placed in Jan 2025 by IR, and is f/b urology. Has staghorn calculi on the L and had previously been scheduled to have stone removed and stent placed per his wife on March 7th at [facility]. Will need to be rescheduled and pt/wife has already called and arranged.  Urinary retention - continue foley catheter Chronic atrial fib - on eliquis; per primary team  Hypophosphatemia secondary to CRRT - resolved Hypokalemia -  resolved Anemia normocytic: cont ESA; PRBC's per primary team    Recent Labs  Lab 07/08/23 0414 07/09/23 0513  HGB 7.5* 7.7*  ALBUMIN 1.9* 1.9*  CALCIUM 8.0* 7.6*  PHOS 4.9* 5.1*  CREATININE 4.93* 4.98*  K 3.4* 4.4   Recent Labs  Lab 07/03/23 1514  IRON 35*  TIBC 179*  FERRITIN 88   Inpatient medications:  alteplase  2 mg Intracatheter Once   apixaban  2.5 mg Oral BID   Chlorhexidine Gluconate Cloth  6 each Topical Daily   darbepoetin (ARANESP) injection - NON-DIALYSIS  40 mcg Subcutaneous Q Fri-1800   insulin aspart  0-9 Units Subcutaneous TID WC   iron polysaccharides  150 mg Oral Daily   midodrine  10 mg Oral TID WC   multivitamin  1 tablet Oral QHS   vancomycin  500 mg Oral Q6H    promethazine (PHENERGAN) injection (IM or IVPB) 12.5 mg (07/08/23 1821)   acetaminophen, heparin,  HYDROmorphone (DILAUDID) injection, menthol-cetylpyridinium, ondansetron (ZOFRAN) IV, mouth rinse, oxyCODONE, promethazine (PHENERGAN) injection (IM or IVPB), simethicone, sodium chloride flush, tiZANidine, traZODone   Arita Miss, MD 11:22 AM 07/09/2023

## 2023-07-10 ENCOUNTER — Other Ambulatory Visit: Payer: Self-pay

## 2023-07-10 ENCOUNTER — Encounter (HOSPITAL_COMMUNITY): Payer: Self-pay | Admitting: Physical Medicine & Rehabilitation

## 2023-07-10 ENCOUNTER — Inpatient Hospital Stay (HOSPITAL_COMMUNITY)
Admission: AD | Admit: 2023-07-10 | Discharge: 2023-08-21 | DRG: 940 | Disposition: A | Discharge status: Home Health | Source: Intra-hospital | Attending: Physical Medicine & Rehabilitation | Admitting: Physical Medicine & Rehabilitation

## 2023-07-10 ENCOUNTER — Inpatient Hospital Stay
Admission: RE | Admit: 2023-07-10 | Discharge: 2023-07-10 | Disposition: A | Discharge status: Home / Self Care | Payer: No Typology Code available for payment source | Source: Ambulatory Visit | Attending: Interventional Radiology | Admitting: Interventional Radiology

## 2023-07-10 DIAGNOSIS — M170 Bilateral primary osteoarthritis of knee: Secondary | ICD-10-CM | POA: Diagnosis not present

## 2023-07-10 DIAGNOSIS — N136 Pyonephrosis: Secondary | ICD-10-CM | POA: Diagnosis not present

## 2023-07-10 DIAGNOSIS — F32A Depression, unspecified: Secondary | ICD-10-CM | POA: Diagnosis present

## 2023-07-10 DIAGNOSIS — E872 Acidosis, unspecified: Secondary | ICD-10-CM | POA: Diagnosis present

## 2023-07-10 DIAGNOSIS — L89322 Pressure ulcer of left buttock, stage 2: Secondary | ICD-10-CM | POA: Diagnosis present

## 2023-07-10 DIAGNOSIS — N3001 Acute cystitis with hematuria: Secondary | ICD-10-CM

## 2023-07-10 DIAGNOSIS — N202 Calculus of kidney with calculus of ureter: Secondary | ICD-10-CM | POA: Diagnosis present

## 2023-07-10 DIAGNOSIS — E876 Hypokalemia: Secondary | ICD-10-CM | POA: Diagnosis present

## 2023-07-10 DIAGNOSIS — N189 Chronic kidney disease, unspecified: Secondary | ICD-10-CM | POA: Diagnosis not present

## 2023-07-10 DIAGNOSIS — M25561 Pain in right knee: Secondary | ICD-10-CM | POA: Diagnosis present

## 2023-07-10 DIAGNOSIS — R5381 Other malaise: Secondary | ICD-10-CM | POA: Diagnosis present

## 2023-07-10 DIAGNOSIS — N179 Acute kidney failure, unspecified: Secondary | ICD-10-CM | POA: Diagnosis present

## 2023-07-10 DIAGNOSIS — L89312 Pressure ulcer of right buttock, stage 2: Secondary | ICD-10-CM | POA: Diagnosis present

## 2023-07-10 DIAGNOSIS — E118 Type 2 diabetes mellitus with unspecified complications: Secondary | ICD-10-CM | POA: Diagnosis not present

## 2023-07-10 DIAGNOSIS — F418 Other specified anxiety disorders: Secondary | ICD-10-CM | POA: Diagnosis not present

## 2023-07-10 DIAGNOSIS — Z794 Long term (current) use of insulin: Secondary | ICD-10-CM

## 2023-07-10 DIAGNOSIS — D72823 Leukemoid reaction: Secondary | ICD-10-CM | POA: Diagnosis not present

## 2023-07-10 DIAGNOSIS — M7502 Adhesive capsulitis of left shoulder: Secondary | ICD-10-CM | POA: Diagnosis present

## 2023-07-10 DIAGNOSIS — G4700 Insomnia, unspecified: Secondary | ICD-10-CM | POA: Diagnosis not present

## 2023-07-10 DIAGNOSIS — L8990 Pressure ulcer of unspecified site, unspecified stage: Secondary | ICD-10-CM | POA: Diagnosis not present

## 2023-07-10 DIAGNOSIS — I10 Essential (primary) hypertension: Secondary | ICD-10-CM | POA: Diagnosis not present

## 2023-07-10 DIAGNOSIS — A0472 Enterocolitis due to Clostridium difficile, not specified as recurrent: Secondary | ICD-10-CM | POA: Diagnosis present

## 2023-07-10 DIAGNOSIS — I129 Hypertensive chronic kidney disease with stage 1 through stage 4 chronic kidney disease, or unspecified chronic kidney disease: Secondary | ICD-10-CM | POA: Diagnosis present

## 2023-07-10 DIAGNOSIS — N201 Calculus of ureter: Secondary | ICD-10-CM | POA: Diagnosis present

## 2023-07-10 DIAGNOSIS — D649 Anemia, unspecified: Secondary | ICD-10-CM | POA: Diagnosis present

## 2023-07-10 DIAGNOSIS — M25562 Pain in left knee: Secondary | ICD-10-CM | POA: Diagnosis present

## 2023-07-10 DIAGNOSIS — R310 Gross hematuria: Secondary | ICD-10-CM

## 2023-07-10 DIAGNOSIS — F419 Anxiety disorder, unspecified: Secondary | ICD-10-CM | POA: Diagnosis present

## 2023-07-10 DIAGNOSIS — N184 Chronic kidney disease, stage 4 (severe): Secondary | ICD-10-CM | POA: Diagnosis present

## 2023-07-10 DIAGNOSIS — I483 Typical atrial flutter: Secondary | ICD-10-CM | POA: Diagnosis present

## 2023-07-10 DIAGNOSIS — D72829 Elevated white blood cell count, unspecified: Secondary | ICD-10-CM | POA: Diagnosis present

## 2023-07-10 DIAGNOSIS — M1711 Unilateral primary osteoarthritis, right knee: Secondary | ICD-10-CM | POA: Diagnosis not present

## 2023-07-10 DIAGNOSIS — R54 Age-related physical debility: Secondary | ICD-10-CM | POA: Diagnosis present

## 2023-07-10 DIAGNOSIS — Z7901 Long term (current) use of anticoagulants: Secondary | ICD-10-CM

## 2023-07-10 DIAGNOSIS — E440 Moderate protein-calorie malnutrition: Secondary | ICD-10-CM | POA: Diagnosis present

## 2023-07-10 DIAGNOSIS — D631 Anemia in chronic kidney disease: Secondary | ICD-10-CM | POA: Diagnosis present

## 2023-07-10 DIAGNOSIS — I482 Chronic atrial fibrillation, unspecified: Secondary | ICD-10-CM | POA: Diagnosis present

## 2023-07-10 DIAGNOSIS — E785 Hyperlipidemia, unspecified: Secondary | ICD-10-CM | POA: Diagnosis present

## 2023-07-10 DIAGNOSIS — I951 Orthostatic hypotension: Secondary | ICD-10-CM | POA: Diagnosis not present

## 2023-07-10 DIAGNOSIS — N200 Calculus of kidney: Secondary | ICD-10-CM | POA: Diagnosis not present

## 2023-07-10 DIAGNOSIS — F39 Unspecified mood [affective] disorder: Secondary | ICD-10-CM | POA: Diagnosis not present

## 2023-07-10 DIAGNOSIS — E1122 Type 2 diabetes mellitus with diabetic chronic kidney disease: Secondary | ICD-10-CM | POA: Diagnosis present

## 2023-07-10 DIAGNOSIS — Z8619 Personal history of other infectious and parasitic diseases: Secondary | ICD-10-CM

## 2023-07-10 DIAGNOSIS — I959 Hypotension, unspecified: Secondary | ICD-10-CM | POA: Diagnosis present

## 2023-07-10 DIAGNOSIS — D62 Acute posthemorrhagic anemia: Secondary | ICD-10-CM | POA: Diagnosis present

## 2023-07-10 DIAGNOSIS — I4510 Unspecified right bundle-branch block: Secondary | ICD-10-CM | POA: Diagnosis present

## 2023-07-10 DIAGNOSIS — M1712 Unilateral primary osteoarthritis, left knee: Secondary | ICD-10-CM | POA: Diagnosis not present

## 2023-07-10 DIAGNOSIS — R339 Retention of urine, unspecified: Secondary | ICD-10-CM | POA: Diagnosis not present

## 2023-07-10 DIAGNOSIS — I4811 Longstanding persistent atrial fibrillation: Secondary | ICD-10-CM | POA: Diagnosis present

## 2023-07-10 DIAGNOSIS — R600 Localized edema: Secondary | ICD-10-CM | POA: Diagnosis not present

## 2023-07-10 DIAGNOSIS — E861 Hypovolemia: Secondary | ICD-10-CM | POA: Diagnosis present

## 2023-07-10 DIAGNOSIS — Z6835 Body mass index (BMI) 35.0-35.9, adult: Secondary | ICD-10-CM | POA: Diagnosis not present

## 2023-07-10 DIAGNOSIS — M24512 Contracture, left shoulder: Secondary | ICD-10-CM | POA: Diagnosis not present

## 2023-07-10 DIAGNOSIS — R319 Hematuria, unspecified: Secondary | ICD-10-CM | POA: Diagnosis not present

## 2023-07-10 DIAGNOSIS — M109 Gout, unspecified: Secondary | ICD-10-CM | POA: Diagnosis present

## 2023-07-10 DIAGNOSIS — E119 Type 2 diabetes mellitus without complications: Secondary | ICD-10-CM | POA: Diagnosis not present

## 2023-07-10 DIAGNOSIS — Z79899 Other long term (current) drug therapy: Secondary | ICD-10-CM

## 2023-07-10 DIAGNOSIS — E66811 Obesity, class 1: Secondary | ICD-10-CM | POA: Diagnosis present

## 2023-07-10 DIAGNOSIS — E869 Volume depletion, unspecified: Secondary | ICD-10-CM | POA: Diagnosis present

## 2023-07-10 HISTORY — DX: Cardiac arrhythmia, unspecified: I49.9

## 2023-07-10 HISTORY — DX: Unspecified atrial fibrillation: I48.91

## 2023-07-10 LAB — RENAL FUNCTION PANEL
Albumin: 1.9 g/dL — ABNORMAL LOW (ref 3.5–5.0)
Anion gap: 7 (ref 5–15)
BUN: 52 mg/dL — ABNORMAL HIGH (ref 8–23)
CO2: 21 mmol/L — ABNORMAL LOW (ref 22–32)
Calcium: 7.9 mg/dL — ABNORMAL LOW (ref 8.9–10.3)
Chloride: 103 mmol/L (ref 98–111)
Creatinine, Ser: 5.04 mg/dL — ABNORMAL HIGH (ref 0.61–1.24)
GFR, Estimated: 12 mL/min — ABNORMAL LOW (ref >60)
Glucose, Bld: 109 mg/dL — ABNORMAL HIGH (ref 70–99)
Phosphorus: 5.4 mg/dL — ABNORMAL HIGH (ref 2.5–4.6)
Potassium: 4.4 mmol/L (ref 3.5–5.1)
Sodium: 131 mmol/L — ABNORMAL LOW (ref 135–145)

## 2023-07-10 LAB — MAGNESIUM: Magnesium: 1.9 mg/dL (ref 1.7–2.4)

## 2023-07-10 LAB — GLUCOSE, CAPILLARY
Glucose-Capillary: 111 mg/dL — ABNORMAL HIGH (ref 70–99)
Glucose-Capillary: 165 mg/dL — ABNORMAL HIGH (ref 70–99)
Glucose-Capillary: 155 mg/dL — ABNORMAL HIGH (ref 70–99)
Glucose-Capillary: 132 mg/dL — ABNORMAL HIGH (ref 70–99)

## 2023-07-10 MED ORDER — POLYSACCHARIDE IRON COMPLEX 150 MG PO CAPS
150.0000 mg | ORAL_CAPSULE | Freq: Every day | ORAL | Status: DC
  Administered 2023-07-11 – 2023-08-21 (×41): 150 mg via ORAL
  Filled 2023-07-10 (×41): qty 1

## 2023-07-10 MED ORDER — MIDODRINE HCL 5 MG PO TABS
10.0000 mg | ORAL_TABLET | Freq: Three times a day (TID) | ORAL | Status: DC
  Administered 2023-07-10 – 2023-07-22 (×36): 10 mg via ORAL
  Filled 2023-07-10 (×37): qty 2

## 2023-07-10 MED ORDER — TIZANIDINE HCL 2 MG PO TABS
4.0000 mg | ORAL_TABLET | Freq: Two times a day (BID) | ORAL | Status: DC | PRN
  Administered 2023-07-10 – 2023-08-05 (×14): 4 mg via ORAL
  Filled 2023-07-10 (×16): qty 2

## 2023-07-10 MED ORDER — TRAZODONE HCL 50 MG PO TABS
50.0000 mg | ORAL_TABLET | Freq: Every evening | ORAL | Status: DC | PRN
  Administered 2023-07-10 – 2023-07-21 (×10): 50 mg via ORAL
  Filled 2023-07-10 (×11): qty 1

## 2023-07-10 MED ORDER — SIMETHICONE 80 MG PO CHEW: 80.0000 mg | CHEWABLE_TABLET | Freq: Four times a day (QID) | ORAL | Status: DC | PRN

## 2023-07-10 MED ORDER — DICLOFENAC SODIUM 1 % EX GEL
2.0000 g | Freq: Four times a day (QID) | CUTANEOUS | Status: DC
  Administered 2023-07-11 – 2023-08-21 (×112): 2 g via TOPICAL
  Filled 2023-07-10 (×3): qty 100

## 2023-07-10 MED ORDER — RENA-VITE PO TABS
1.0000 | ORAL_TABLET | Freq: Every day | ORAL | Status: DC
  Administered 2023-07-10 – 2023-08-20 (×42): 1 via ORAL
  Filled 2023-07-10 (×42): qty 1

## 2023-07-10 MED ORDER — DARBEPOETIN ALFA 40 MCG/0.4ML IJ SOSY: 40.0000 ug | PREFILLED_SYRINGE | INTRAMUSCULAR | Status: DC

## 2023-07-10 MED ORDER — TIZANIDINE HCL 4 MG PO TABS: 4.0000 mg | ORAL_TABLET | Freq: Two times a day (BID) | ORAL | Status: DC | PRN

## 2023-07-10 MED ORDER — APIXABAN 5 MG PO TABS: 5.0000 mg | ORAL_TABLET | Freq: Two times a day (BID) | ORAL | Status: DC

## 2023-07-10 MED ORDER — CHLORHEXIDINE GLUCONATE CLOTH 2 % EX PADS
6.0000 | MEDICATED_PAD | Freq: Two times a day (BID) | CUTANEOUS | Status: DC
  Administered 2023-07-10 – 2023-07-13 (×6): 6 via TOPICAL

## 2023-07-10 MED ORDER — POLYSACCHARIDE IRON COMPLEX 150 MG PO CAPS: 150.0000 mg | ORAL_CAPSULE | Freq: Every day | ORAL | Status: DC

## 2023-07-10 MED ORDER — INSULIN ASPART 100 UNIT/ML IJ SOLN: 0.0000 [IU] | Freq: Three times a day (TID) | INTRAMUSCULAR | Status: DC

## 2023-07-10 MED ORDER — MIDODRINE HCL 10 MG PO TABS: 10.0000 mg | ORAL_TABLET | Freq: Three times a day (TID) | ORAL | Status: DC

## 2023-07-10 MED ORDER — APIXABAN 5 MG PO TABS
5.0000 mg | ORAL_TABLET | Freq: Two times a day (BID) | ORAL | Status: DC
  Administered 2023-07-10 – 2023-08-04 (×49): 5 mg via ORAL
  Filled 2023-07-10 (×49): qty 1

## 2023-07-10 MED ORDER — INSULIN ASPART 100 UNIT/ML IJ SOLN
0.0000 [IU] | Freq: Three times a day (TID) | INTRAMUSCULAR | Status: DC
Start: 2023-07-10 — End: 2023-07-16
  Administered 2023-07-10: 2 [IU] via SUBCUTANEOUS
  Administered 2023-07-11 – 2023-07-12 (×5): 1 [IU] via SUBCUTANEOUS
  Administered 2023-07-12 – 2023-07-13 (×2): 2 [IU] via SUBCUTANEOUS
  Administered 2023-07-14 (×2): 1 [IU] via SUBCUTANEOUS
  Administered 2023-07-15: 2 [IU] via SUBCUTANEOUS

## 2023-07-10 MED ORDER — TRAZODONE HCL 50 MG PO TABS: 50.0000 mg | ORAL_TABLET | Freq: Every evening | ORAL | Status: DC | PRN

## 2023-07-10 MED ORDER — OXYCODONE HCL 5 MG PO TABS
5.0000 mg | ORAL_TABLET | ORAL | Status: DC | PRN
  Administered 2023-07-10 – 2023-07-11 (×4): 10 mg via ORAL
  Administered 2023-07-12 (×2): 5 mg via ORAL
  Administered 2023-07-13 (×2): 10 mg via ORAL
  Filled 2023-07-10: qty 2
  Filled 2023-07-10: qty 1
  Filled 2023-07-10 (×5): qty 2
  Filled 2023-07-10: qty 1

## 2023-07-10 MED ORDER — DARBEPOETIN ALFA 40 MCG/0.4ML IJ SOSY
40.0000 ug | PREFILLED_SYRINGE | INTRAMUSCULAR | Status: DC
  Administered 2023-07-10 – 2023-07-17 (×2): 40 ug via SUBCUTANEOUS
  Filled 2023-07-10 (×3): qty 0.4

## 2023-07-10 MED ORDER — ACETAMINOPHEN 325 MG PO TABS
650.0000 mg | ORAL_TABLET | ORAL | Status: DC | PRN
Start: 2023-07-10 — End: 2023-08-21
  Administered 2023-07-12 – 2023-08-04 (×12): 650 mg via ORAL
  Filled 2023-07-10 (×13): qty 2

## 2023-07-10 NOTE — Progress Notes (Signed)
 Signed     Expand All Collapse All PMR Admission Coordinator Pre-Admission Assessment   Patient: Calvin Schultz is an 73 y.o., male MRN: [id number] DOB: 04-18-1951 Height: 5\' 9"  (175.3 cm) Weight: 102.8 kg   Insurance Information HMO:     PPO:      PCP:      IPA:      80/20:      OTHER:  PRIMARY: [employer] [place] [employer]erred       Policy#: [id number]       Subscriber: pt CM Name:       Phone#:      Fax#: [phone] Pt. Approved on 3/5 for admit 7/4-2/59 Pre-Cert#: [id number] Received approval on 07/08/23. Pt approved from 07/08/30-07/15/23. Pt approved for 8 days. Next review date is 07/15/23.    Employer:  Benefits:  Phone #:      Name:  Eff. Date: 10/03/2020     Deduct: included in OOP $4,200 (met)      Out of Pocket Max: 6,750 [phone])      Life Max:  CIR: 20% coinsurance      SNF: 20% coinsurance  Outpatient:  20% coinsurance  Home Health:    20% coinsurance  DME: 20% coinsurance  Providers: in network  SECONDARY: Medicare Part A       Policy#: [id number]      Phone#:      Financial Counselor:       Phone#:    The "Data Collection Information Summary" for patients in Inpatient Rehabilitation Facilities with attached "Privacy Act Statement-Health Care Records" was provided and verbally reviewed with: Pt.    Emergency Contact Information Contact Information       Name Relation Home Work Mobile    [place] Spouse [phone]   [phone]         Other Contacts   None on File        Current Medical History  Patient Admitting Diagnosis: AKI, debility History of Present Illness: Pt is a  73 year old male with past medical history as below, which is significant for atrial fibrillation on Eliquis, hypertension, diabetes mellitus, CKD, and recently admitted for acute on chronic renal failure with left-sided hydronephrosis secondary to several calculi in the left renal collecting system with possible staghorn calculus concerning for left UPJ stricture. Nephrostomy tube was  placed on the left and the patient was discharged with the nephrostomy tube as well as ongoing antibiotics transitioned from ceftriaxone to Bactrim p.o. Also treated for lower extremity edema during that admission with aggressive IV Lasix diuresis. Initial post discharge course was unremarkable, but ultimately the patient began to experience worsening weakness associated with nausea, vomiting, and diarrhea as well as left lower quadrant pain. He has some difficulty walking at baseline due to knee pain but as a result of the mentioned symptoms he became unable to walk at all due to weakness. This is when his family called EMS and he was transported to [facility] emergency department on 2/06/06/23. Upon arrival to the emergency department he was noted to be hypotensive and IV fluid resuscitation was initiated. Initially blood pressure did respond to IV fluids, however, he was ultimately required to be started on norepinephrine infusion. Workup in the ED included CT scan which was concerning for colitis of the descending and sigmoid colon. C. difficile antigen and toxin positive. He was started on appropriate antibiotics. PCCM was asked to evaluate in the setting of hypotension. Pt. Was transferred to [facility] 07/01/23. Nephrology following, iHD started.  He was seen by PT/OT/SLP and they recommend CIR to assist return to PLOF.    Patient's medical record from [facility]  has been reviewed by the rehabilitation admission coordinator and physician.   Past Medical History      Past Medical History:  Diagnosis Date   Arthritis     CKD (chronic kidney disease) stage 3, GFR 30-59 ml/min (HCC)     Diabetes mellitus without complication (HCC)     Hyperlipidemia     Hypertension            Has the patient had major surgery during 100 days prior to admission? Yes   Family History   family history includes Dementia in his mother.   Current Medications  Current  Medications    Current Facility-Administered Medications:    acetaminophen (TYLENOL) tablet 650 mg, 650 mg, Oral, Q4H PRN, Oretha Milch, MD   alteplase (CATHFLO ACTIVASE) injection 2 mg, 2 mg, Intracatheter, Once, Mdala-Gausi, Gwenette Greet, MD   apixaban (ELIQUIS) tablet 2.5 mg, 2.5 mg, Oral, BID, Oretha Milch, MD, 2.5 mg at 07/07/23 0919   Chlorhexidine Gluconate Cloth 2 % PADS 6 each, 6 each, Topical, Daily, Oretha Milch, MD, 6 each at 07/07/23 1191   Darbepoetin Alfa (ARANESP) injection 40 mcg, 40 mcg, Subcutaneous, Q Fri-1800, Estanislado Emms, MD, 40 mcg at 07/03/23 1727   heparin injection 1,000-6,000 Units, 1,000-6,000 Units, CRRT, PRN, Oretha Milch, MD, 2,800 Units at 06/30/23 1812   HYDROmorphone (DILAUDID) injection 1 mg, 1 mg, Intravenous, Q3H PRN, Oretha Milch, MD, 1 mg at 07/07/23 0919   insulin aspart (novoLOG) injection 0-9 Units, 0-9 Units, Subcutaneous, TID WC, Oretha Milch, MD, 1 Units at 07/07/23 1146   iron polysaccharides (NIFEREX) capsule 150 mg, 150 mg, Oral, Daily, Estanislado Emms, MD, 150 mg at 07/07/23 4782   menthol-cetylpyridinium (CEPACOL) lozenge 3 mg, 1 lozenge, Oral, PRN, Oretha Milch, MD, 3 mg at 06/30/23 9562   midodrine (PROAMATINE) tablet 10 mg, 10 mg, Oral, TID WC, Oretha Milch, MD, 10 mg at 07/07/23 1145   multivitamin (RENA-VIT) tablet 1 tablet, 1 tablet, Oral, QHS, Oretha Milch, MD, 1 tablet at 07/06/23 2112   ondansetron [facility]) injection 4 mg, 4 mg, Intravenous, Q8H PRN, Oretha Milch, MD, 4 mg at 07/05/23 2058   Oral care mouth rinse, 15 mL, Mouth Rinse, PRN, Oretha Milch, MD   oxyCODONE (Oxy IR/ROXICODONE) immediate release tablet 5-10 mg, 5-10 mg, Oral, Q4H PRN, Oretha Milch, MD, 10 mg at 07/07/23 1103   promethazine (PHENERGAN) 12.5 mg in sodium chloride 0.9 % 50 mL IVPB, 12.5 mg, Intravenous, Q6H PRN, Mansy, Jan A, MD, Last Rate: 150 mL/hr at 07/06/23 0112, 12.5 mg at 07/06/23 0112   simethicone (MYLICON) chewable tablet 80 mg,  80 mg, Oral, QID PRN, Oretha Milch, MD, 80 mg at 07/01/23 0924   sodium chloride flush (NS) 0.9 % injection 10-40 mL, 10-40 mL, Intracatheter, PRN, Oretha Milch, MD   tiZANidine (ZANAFLEX) tablet 4 mg, 4 mg, Oral, BID PRN, Cyril Mourning V, MD, 4 mg at 07/02/23 2135   traZODone (DESYREL) tablet 50 mg, 50 mg, Oral, QHS PRN, Oretha Milch, MD, 50 mg at 07/05/23 2212   vancomycin (VANCOCIN) capsule 500 mg, 500 mg, Oral, Q6H, Oretha Milch, MD, 500 mg at 07/07/23 0919     Patients Current Diet:  Diet Order  Diet Carb Modified Fluid consistency: Thin; Room service appropriate? Yes; Fluid restriction: 2000 mL Fluid  Diet effective now                         Precautions / Restrictions Precautions Precautions: Fall Precaution/Restrictions Comments: L nephrostomy tube, foley catheter Restrictions Weight Bearing Restrictions Per Provider Order: No    Has the patient had 2 or more falls or a fall with injury in the past year? No   Prior Activity Level Community (5-7x/wk): Pt. active  in the community PTA   Prior Functional Level Self Care: Did the patient need help bathing, dressing, using the toilet or eating? Independent   Indoor Mobility: Did the patient need assistance with walking from room to room (with or without device)? Independent   Stairs: Did the patient need assistance with internal or external stairs (with or without device)? Independent   Functional Cognition: Did the patient need help planning regular tasks such as shopping or remembering to take medications? Independent   Patient Information Are you of Hispanic, Latino/a,or Spanish origin?: A. No, not of Hispanic, Latino/a, or Spanish origin What is your race?: A. White Do you need or want an interpreter to communicate with a doctor or health care staff?: 0. No   Patient's Response To:  Health Literacy and Transportation Is the patient able to respond to health literacy and transportation  needs?: Yes Health Literacy - How often do you need to have someone help you when you read instructions, pamphlets, or other written material from your doctor or pharmacy?: Never In the past 12 months, has lack of transportation kept you from medical appointments or from getting medications?: No In the past 12 months, has lack of transportation kept you from meetings, work, or from getting things needed for daily living?: No   Home Assistive Devices / Equipment Home Equipment: Agricultural consultant (2 wheels), Rollator (4 wheels), [employer] - single point, BSC/3in1, Shower seat, Grab bars - toilet   Prior Device Use: Indicate devices/aids used by the patient prior to current illness, exacerbation or injury? None of the above   Current Functional Level Cognition   Orientation Level: Oriented X4    Extremity Assessment (includes Sensation/Coordination)   Upper Extremity Assessment: RUE deficits/detail, LUE deficits/detail RUE:  (R shld flex 80 degrees and distal rom is wnl) RUE Coordination: WNL LUE Deficits / Details: unable to lift off of bed secondary to swelling and weakness. distal rom is wnl. LUE Coordination: WNL  Lower Extremity Assessment: Generalized weakness (BLE flexion limited to ~75 degrees 2/2 hx of arthritis, significant BLE edema)     ADLs   Overall ADL's : Needs assistance/impaired Eating/Feeding: Independent Grooming: Minimal assistance Upper Body Bathing: Minimal assistance Lower Body Bathing: Maximal assistance, Bed level Upper Body Dressing : Maximal assistance Lower Body Dressing: Total assistance Toilet Transfer:  (unable secondary to LE weakness and OA in knees) Toileting- Clothing Manipulation and Hygiene: Total assistance Functional mobility during ADLs:  (Max A with supine to sit and sit to supine.) General ADL Comments: Pt. is able to perform     Mobility   Overal bed mobility: Needs Assistance Bed Mobility: Supine to Sit, Rolling Rolling: +2 for physical  assistance, Used rails, Mod assist Supine to sit: +2 for physical assistance, HOB elevated, Used rails, Mod assist General bed mobility comments: Pt able to assist bringing legs off EOB, requires cuing & assistance to upright trunk even with HOB elevated, assistance to scoot buttocks to  EOB with use of pad.     Transfers   Overall transfer level: Needs assistance Equipment used: Ambulation equipment used Transfers: Sit to/from Stand, Bed to chair/wheelchair/BSC Sit to Stand: +2 physical assistance, +2 safety/equipment, From elevated surface, Max assist, Via lift equipment Bed to/from chair/wheelchair/BSC transfer type:: Via Lift equipment Step pivot transfers: Min assist, +2 physical assistance (significantly extra time, assistance/cuing for RW management/turning RW, cuing re: sequencing.) Transfer via Lift Equipment: Stedy General transfer comment: PT provides cuing re: hand and foot placement on the STedy, significantly elevated EOB, pt with limited ability to move BLE underneath BOS 2/2 limited knee flexion. Pt requires max assist +2 for STS to power up with use of pad and has difficulty with achieving full upright posture. Pt moved to chair in [place].  Pt stood 1 min with CGA  with CGA to min assist in [place] and then took sitting rest break.  SEcond attempt pt stood 1 min and 1/2.  Pt needs assist and cues for posture. Pt was c/o dizziness today with BP fluctuations with position changes.  Pt pushed himself and stood as long as he could in the [place].     Ambulation / Gait / Stairs / Clinical biochemist / Balance Dynamic Sitting Balance Sitting balance - Comments: supervision static sitting Balance Overall balance assessment: Needs assistance Sitting-balance support: No upper extremity supported, Feet supported Sitting balance-Leahy Scale: Fair Sitting balance - Comments: supervision static sitting Standing balance support: Reliant on assistive device for balance, During  functional activity, Bilateral upper extremity supported Standing balance-Leahy Scale: Poor Standing balance comment: relies on STedy and +2 min to CGA to min assist for static standing.     Special needs/care consideration Skin Ecchymosis: abdomen/right, left; Erythema/Redness: sacrum/ right, left; Scratch Marks: arm/right; Wound-Skin tear: perineum/right, Diabetic Managemen: Novolog 0-9 units 3x daily with meals; Nephrotomy: left; Bowel incontinence; Urethral Catheter    Previous Home Environment (from acute therapy documentation) Living Arrangements: Spouse/significant other  Lives With: Spouse Available Help at Discharge: Family, Available PRN/intermittently Type of Home: House Home Layout: Two level, Able to live on main level with bedroom/bathroom Home Access: Ramped entrance Bathroom Shower/Tub: Health visitor: Administrator Accessibility: Yes Home Care Services: No   Discharge Living Setting Plans for Discharge Living Setting: Patient's home Type of Home at Discharge: House Discharge Home Layout: Two level, Able to live on main level with bedroom/bathroom Alternate Level Stairs-Rails: Right, Left Alternate Level Stairs-Number of Steps: flight Discharge Home Access: Ramped entrance Discharge Bathroom Shower/Tub: Walk-in shower Discharge Bathroom Toilet: Standard Discharge Bathroom Accessibility: Yes How Accessible: Accessible via walker Does the patient have any problems obtaining your medications?: No   Social/Family/Support Systems Patient Roles: Spouse Contact Information: [phone] Anticipated Caregiver: Mekhi Sonn Anticipated Caregiver's Contact Information: Wife 24/7 Min A Ability/Limitations of Caregiver: 24/7 Caregiver Availability: 24/7 Discharge Plan Discussed with Primary Caregiver: Yes Is Caregiver In Agreement with Plan?: Yes Does Caregiver/Family have Issues with Lodging/Transportation while Pt is in Rehab?: Yes    Goals Patient/Family Goal for Rehab: PT/OT MIn A Expected length of stay: 16-18 days Pt/Family Agrees to Admission and willing to participate: Yes Program Orientation Provided & Reviewed with Pt/Caregiver Including Roles  & Responsibilities: Yes   Decrease burden of Care through IP rehab admission: not anticipated   Possible need for SNF placement upon discharge: not anticipated   Patient Condition: I have reviewed medical records from [facility], spoken with CM, and patient.  I met with patient at the bedside for inpatient rehabilitation assessment.  Patient will benefit from ongoing PT and OT, can actively participate in 3 hours of therapy a day 5 days of the week, and can make measurable gains during the admission.  Patient will also benefit from the coordinated team approach during an Inpatient Acute Rehabilitation admission.  The patient will receive intensive therapy as well as Rehabilitation physician, nursing, social worker, and care management interventions.  Due to safety, skin/wound care, disease management, medication administration, pain management, and patient education the patient requires 24 hour a day rehabilitation nursing.  The patient is currently Max A +2 with mobility and Min-Total A with basic ADLs.  Discharge setting and therapy post discharge at home with home health is anticipated.  Patient has agreed to participate in the Acute Inpatient Rehabilitation Program and will admit today.   Preadmission Screen Completed By:  Jeronimo Greaves, 07/07/2023 2:26 PM ______________________________________________________________________   Discussed status with Dr. Shearon Stalls on 07/10/23 at 10:24 AM and received approval for admission today.   Admission Coordinator:  Jeronimo Greaves, CCC-SLP, time 10:24 AM/Date 07/10/23     Assessment/Plan: Diagnosis: Debility due to sepsis from C diff colitis, complicated by AKI on CKD Does the need for close, 24 hr/day Medical  supervision in concert with the patient's rehab needs make it unreasonable for this patient to be served in a less intensive setting? Yes Co-Morbidities requiring supervision/potential complications: C diff colitis, AKI on CKD with intermittent CRRT, L nephrostomy, A fib with intermittent brqadycardia, type 2 diabetes, and poor pain control Due to bladder management, bowel management, safety, skin/wound care, disease management, medication administration, pain management, and patient education, does the patient require 24 hr/day rehab nursing? Yes Does the patient require coordinated care of a physician, rehab nurse, PT, OT to address physical and functional deficits in the context of the above medical diagnosis(es)? Yes Addressing deficits in the following areas: balance, endurance, locomotion, strength, transferring, bathing, dressing, feeding, grooming, and toileting Can the patient actively participate in an intensive therapy program of at least 3 hrs of therapy 5 days a week? Yes The potential for patient to make measurable gains while on inpatient rehab is good Anticipated functional outcomes upon discharge from inpatient rehab: min assist PT, min assist OT Estimated rehab length of stay to reach the above functional goals is: 18-21 days Anticipated discharge destination: Home 10. Overall Rehab/Functional Prognosis: good     MD Signature:   Angelina Sheriff, DO 07/10/2023

## 2023-07-10 NOTE — Discharge Summary (Signed)
 Physician Discharge Summary  Rawad Bochicchio WUJ:[id number] DOB: Dec 14, 1950 DOA: 06/26/2023  PCP: Gwenlyn Found, MD  Admit date: 06/26/2023 Discharge date: 07/10/2023  Admitted From: home Discharge disposition: CIR   Recommendations for Outpatient Follow-Up:   Monitor Cr closely-- may need renal to follow   Discharge Diagnosis:   Principal Problem:   Clostridium difficile colitis Active Problems:   Acute renal failure superimposed on chronic kidney disease (HCC)    Discharge Condition: Improved.  Diet recommendation: carb mod  Wound care: None.  Code status: Full.   History of Present Illness:   73 year old male with past medical history as below, which is significant for atrial fibrillation on Eliquis, hypertension, diabetes mellitus, CKD, and recently admitted for acute on chronic renal failure with left-sided hydronephrosis secondary to several calculi in the left renal collecting system with possible staghorn calculus concerning for left UPJ stricture. Nephrostomy tube was placed on the left and the patient was discharged with the nephrostomy tube as well as ongoing antibiotics transitioned from ceftriaxone to Bactrim p.o. Also treated for lower extremity edema during that admission with aggressive IV Lasix diuresis. Initial post discharge course was unremarkable, but ultimately the patient began to experience worsening weakness associated with nausea, vomiting, and diarrhea as well as left lower quadrant pain. He has some difficulty walking at baseline due to knee pain but as a result of the mentioned symptoms he became unable to walk at all due to weakness. This is what his family called EMS and he was transported to [facility] emergency department on 2/21. Upon arrival to the emergency department he was noted to be hypotensive and IV fluid resuscitation was initiated. Initially blood pressure did respond to IV fluids, however, he was ultimately required to be started  on norepinephrine infusion. Workup in the ED included CT scan which was concerning for colitis of the descending and sigmoid colon. C. difficile antigen and toxin positive. He was started on appropriate antibiotics. PCCM was asked to evaluate in the setting of hypotension.    Hospital Course by Problem:   Septic shock Due to C-diff colitis Blood cultures were negative.  Weaned off pressors. - Continue midodrine    C. difficile colitis He does not seem to have been on long-term antibiotics, complains of diarrhea for a year X-ray abdomen showed thumbprinting of transverse colon, no evidence of bowel obstruction on CT 2/21 Tested positive for C diff on 06/26/23. - oral vancomycin for total of 14 days (end date 3/7).     AKI superimposed on CKD stage IV Baseline creatinine 4.9 Nephrology following.  Patient was on CRRT prior to transfer to [facility].  Nephrology following and evaluating need for longer-term dialysis- daily labs -Monitor renal function. -Avoid nephrotoxins. -Temporary hemodialysis catheter removed  (3/4)    Status post left nephrostomy  Nephrostomy tube placed 1/29 by IR with plan for ureteric stent placement 3/21-has staghorn calculi on left Nephrology following, appreciate assistance  CRRT stopped, may need IHD at some point although urine output reassuring for renal recovery-- would have renal follow for stability   Chronic atrial fibrillation with episodes of bradycardia Seen by cardiology. Cardiology states patient has atrial fibrillation with slow response.  This is a stable rhythm. Per cardiology, -Avoid AV nodal blocking agents -Continue Eliquis   T2DM On levemir insulin at home.  - continue diabetic diet.  - SSI.    Deconditioning Seen by PT--to CIR     Nutrition Status: Nutrition Problem: Increased nutrient needs  Etiology: acute illness (AKI superimposed on CKD stage IV - on CRRT) Signs/Symptoms: estimated needs Interventions: Refer to RD note  for recommendations, Ensure Enlive (each supplement provides 350kcal and 20 grams of protein), MVI, Liberalize Diet      Medical Consultants:      Discharge Exam:   Vitals:   07/10/23 0513 07/10/23 0831  BP: 124/85 (!) 141/77  Pulse: 69 66  Resp: 18 18  Temp: 98 F (36.7 C) 98.3 F (36.8 C)  SpO2: 100% 96%   Vitals:   07/09/23 1642 07/09/23 2031 07/10/23 0513 07/10/23 0831  BP: 119/71 135/63 124/85 (!) 141/77  Pulse: 70 (!) 110 69 66  Resp: 18 18 18 18   Temp: 98.2 F (36.8 C) 98.3 F (36.8 C) 98 F (36.7 C) 98.3 F (36.8 C)  TempSrc: Oral Oral Oral Oral  SpO2: 99% 100% 100% 96%  Weight:      Height:        General exam: Appears calm and comfortable.  The results of significant diagnostics from this hospitalization (including imaging, microbiology, ancillary and laboratory) are listed below for reference.     Procedures and Diagnostic Studies:   DG CHEST PORT 1 VIEW Result Date: 06/27/2023 CLINICAL DATA:  Central line placement. EXAM: PORTABLE CHEST 1 VIEW COMPARISON:  AP chest 06/26/2023, chest two views 05/30/2023; CT abdomen and pelvis 06/26/2023 FINDINGS: New left internal jugular dual-lumen central venous catheter tip overlies the mid superior vena cava, just past the junction with the left brachiocephalic vein. Cardiac silhouette is again moderately enlarged. Mediastinal contours are within limits. Mildly decreased lung volumes. Left basilar retrocardiac linear densities corresponding to the tiny pleural effusion and subsegmental atelectasis on 06/26/2023 CT. No definite pleural effusion. No pneumothorax. No acute skeletal abnormality. IMPRESSION: 1. New left internal jugular dual-lumen central venous catheter tip overlies the mid superior vena cava, in appropriate position. No pneumothorax. 2. Left basilar retrocardiac linear densities corresponding to the tiny pleural effusion and subsegmental atelectasis on 06/26/2023 CT. Electronically Signed   By: Neita Garnet  M.D.   On: 06/27/2023 17:04   DG CHEST PORT 1 VIEW Result Date: 06/26/2023 CLINICAL DATA:  Acute renal failure, chronic renal insufficiency, volume overload EXAM: PORTABLE CHEST 1 VIEW COMPARISON:  05/30/2023 FINDINGS: 2 frontal views of the chest demonstrates stable enlargement of the cardiac silhouette. No acute airspace disease, effusion, or pneumothorax. Left hemidiaphragm is excluded by collimation. No acute bony abnormalities. IMPRESSION: 1. No acute intrathoracic process. Electronically Signed   By: Sharlet Salina M.D.   On: 06/26/2023 16:23   CT Renal Stone Study Result Date: 06/26/2023 CLINICAL DATA:  Acute left groin pain. EXAM: CT ABDOMEN AND PELVIS WITHOUT CONTRAST TECHNIQUE: Multidetector CT imaging of the abdomen and pelvis was performed following the standard protocol without IV contrast. RADIATION DOSE REDUCTION: This exam was performed according to the departmental dose-optimization program which includes automated exposure control, adjustment of the mA and/or kV according to patient size and/or use of iterative reconstruction technique. COMPARISON:  May 30, 2023. FINDINGS: Lower chest: Minimal left posterior basilar subsegmental atelectasis is noted. Hepatobiliary: Small gallstone is noted. No biliary dilatation is noted. Liver is unremarkable on these unenhanced images. Pancreas: Unremarkable. No pancreatic ductal dilatation or surrounding inflammatory changes. Spleen: Normal in size without focal abnormality. Adrenals/Urinary Tract: Adrenal glands appear normal. Bilateral nephrolithiasis is noted. Percutaneous left nephrostomy is noted. No hydronephrosis or renal obstruction is noted. Urinary bladder is unremarkable. Stomach/Bowel: The stomach and appendix are unremarkable. There is no evidence of bowel  obstruction. Mild to moderate wall thickening of descending and sigmoid colon is noted concerning for infectious or inflammatory colitis. Vascular/Lymphatic: Aortic atherosclerosis. No  enlarged abdominal or pelvic lymph nodes. Reproductive: Prostate is unremarkable. Other: No abdominal wall hernia or abnormality. No abdominopelvic ascites. Musculoskeletal: No acute or significant osseous findings. IMPRESSION: Bilateral nonobstructive nephrolithiasis. Percutaneous left nephrostomy is noted. Mild to moderate wall thickening of descending and sigmoid colon is noted concerning for infectious or inflammatory colitis. Aortic Atherosclerosis (ICD10-I70.0). Electronically Signed   By: Lupita Raider M.D.   On: 06/26/2023 12:42     Labs:   Basic Metabolic Panel: Recent Labs  Lab 07/06/23 0419 07/07/23 0247 07/08/23 0414 07/09/23 0513 07/10/23 0600  NA 130* 130* 130* 129* 131*  K 3.9 3.6 3.4* 4.4 4.4  CL 99 100 101 103 103  CO2 22 21* 21* 20* 21*  GLUCOSE 93 131* 106* 99 109*  BUN 34* 38* 42* 46* 52*  CREATININE 4.51* 4.71* 4.93* 4.98* 5.04*  CALCIUM 7.8* 7.9* 8.0* 7.6* 7.9*  MG 1.9 1.8 1.9 1.9 1.9  PHOS 4.6 4.7* 4.9* 5.1* 5.4*   GFR Estimated Creatinine Clearance: 16.1 mL/min (A) (by C-G formula based on SCr of 5.04 mg/dL (H)). Liver Function Tests: Recent Labs  Lab 07/06/23 0419 07/07/23 0247 07/08/23 0414 07/09/23 0513 07/10/23 0600  ALBUMIN 1.8* 1.8* 1.9* 1.9* 1.9*   No results for input(s): "LIPASE", "AMYLASE" in the last 168 hours. No results for input(s): "AMMONIA" in the last 168 hours. Coagulation profile No results for input(s): "INR", "PROTIME" in the last 168 hours.  CBC: Recent Labs  Lab 07/04/23 0140 07/05/23 0531 07/06/23 0419 07/07/23 0247 07/08/23 0414 07/09/23 0513  WBC 23.0* 19.2* 16.1* 17.3* 16.8* 21.6*  NEUTROABS 18.1*  --   --   --   --   --   HGB 7.4* 7.9* 7.4* 7.4* 7.5* 7.7*  HCT 24.4* 26.1* 24.4* 24.5* 24.8* 25.7*  MCV 89.4 90.6 88.7 91.1 90.2 90.2  PLT 266 258 272 297 333 402*   Cardiac Enzymes: No results for input(s): "CKTOTAL", "CKMB", "CKMBINDEX", "TROPONINI" in the last 168 hours. BNP: Invalid input(s):  "POCBNP" CBG: Recent Labs  Lab 07/09/23 0729 07/09/23 1123 07/09/23 1640 07/09/23 2053 07/10/23 0801  GLUCAP 126* 118* 194* 146* 111*   D-Dimer No results for input(s): "DDIMER" in the last 72 hours. Hgb A1c No results for input(s): "HGBA1C" in the last 72 hours. Lipid Profile No results for input(s): "CHOL", "HDL", "LDLCALC", "TRIG", "CHOLHDL", "LDLDIRECT" in the last 72 hours. Thyroid function studies No results for input(s): "TSH", "T4TOTAL", "T3FREE", "THYROIDAB" in the last 72 hours.  Invalid input(s): "FREET3" Anemia work up No results for input(s): "VITAMINB12", "FOLATE", "FERRITIN", "TIBC", "IRON", "RETICCTPCT" in the last 72 hours. Microbiology No results found for this or any previous visit (from the past 240 hours).   Discharge Instructions:   Discharge Instructions     Diet Carb Modified   Complete by: As directed    Increase activity slowly   Complete by: As directed    No wound care   Complete by: As directed       Allergies as of 07/10/2023   No Known Allergies      Medication List     STOP taking these medications    amLODipine 5 MG tablet Commonly known as: NORVASC   lanthanum 750 MG chewable tablet Commonly known as: FOSRENOL   Levemir FlexTouch 100 UNIT/ML FlexTouch Pen Generic drug: insulin detemir   olmesartan 20 MG tablet Commonly  known as: BENICAR   rosuvastatin 40 MG tablet Commonly known as: CRESTOR   sevelamer carbonate 800 MG tablet Commonly known as: RENVELA       TAKE these medications    Darbepoetin Alfa 40 MCG/0.4ML Sosy injection Commonly known as: ARANESP Inject 0.4 mLs (40 mcg total) into the skin every Friday at 6 PM.   Eliquis 5 MG Tabs tablet Generic drug: apixaban Take 5 mg by mouth 2 (two) times daily.   insulin aspart 100 UNIT/ML injection Commonly known as: novoLOG Inject 0-9 Units into the skin 3 (three) times daily with meals.   iron polysaccharides 150 MG capsule Commonly known as:  NIFEREX Take 1 capsule (150 mg total) by mouth daily. Start taking on: July 11, 2023   midodrine 10 MG tablet Commonly known as: PROAMATINE Take 1 tablet (10 mg total) by mouth 3 (three) times daily with meals.   tiZANidine 4 MG tablet Commonly known as: ZANAFLEX Take 1 tablet (4 mg total) by mouth 2 (two) times daily as needed for muscle spasms.   traZODone 50 MG tablet Commonly known as: DESYREL Take 1 tablet (50 mg total) by mouth at bedtime as needed for sleep.          Time coordinating discharge: 45 min  Signed:  Joseph Art DO  [facility] 07/10/2023, 10:04 AM

## 2023-07-10 NOTE — Progress Notes (Signed)
 [facility] Progress Note  Subjective:  At bedside with wife About the same UOP 0.6L yesterday  Vitals:   07/09/23 1642 07/09/23 2031 07/10/23 0513 07/10/23 0831  BP: 119/71 135/63 124/85 (!) 141/77  Pulse: 70 (!) 110 69 66  Resp: 18 18 18 18   Temp: 98.2 F (36.8 C) 98.3 F (36.8 C) 98 F (36.7 C) 98.3 F (36.8 C)  TempSrc: Oral Oral Oral Oral  SpO2: 99% 100% 100% 96%  Weight:      Height:        Physical Exam:     NAD, weak and ill appearing HEENT NCAT Lungs clear to auscultation bilaterally normal work of breathing at rest on room air  Heart S1S2 no rub Abdomen soft nontender nondistended Extremities  trace edema lower extremities  Psych normal mood and affect Neuro alert and oriented x 3 provides hx and follows commands     Renal-related home meds: - norvasc 5 - olmesartan 20 every day - fosrenol 750 mg ac tid - renvela 800 ac tid - others: insulin, eliquis       Date                             Creat               eGFR (ml/min) 04/18/2021                  1.87                 39 06/25/21                        2.00                 36 ml/min 07/2021                         1.73                 42 09/2021                         2.17                 32 12/2021                         2.51                 27 01/2022                         2.48                 28 ml/min, CKD 4 05/30/23- 06/05/23 4.43- 5.34 11- 14 ml/min 06/26/23                        10.8                 5 ml/min   UA - prot > 300, mod Hb, large LE, 11-20 rbcs > 50 wbc, 0-5 epi UNa 114, UCr 20 CT renal stone 2/21 - Adrenals/Urinary Tract: Adrenal glands appear normal. Bilateral nephrolithiasis is noted. Percutaneous left nephrostomy is noted. No hydronephrosis or renal obstruction is noted. Urinary bladder is unremarkable.  CXR 2/21 - IMPRESSION: 1. No  acute intrathoracic process.       Assessment/ Plan: Oliguric AKI on CKD 4 - AKI is ischemic and pre-renal insults related to  hypotension +/- ARB and intravasc vol depletion.  CT w/o obstruction, has L neph tube.  He was on CRRT from 2/22 - 2/25.  Note that Cr 4.06- 5.3 from jan 2025, eGFR 11-15 ml/min.  For reference in 01/2022 with Cr 2.48 from Care Everywhere Cont holding HD and watching labs and UOP No vascular access currenlty, TDC if needed  Continue foley catheter and PCN Hopefully we are seeing things level off at a low but stable GFR not requiring dialysis, cont daily assessments Metabolic acidosis -resolved/stable Shock/ hypotension - resolved L nephrostomy tube - placed in Jan 2025 by IR, and is f/b urology. Has staghorn calculi on the L and had previously been scheduled to have stone removed and stent placed per his wife on March 7th at [facility]. Postponed Urinary retention - continue foley catheter Chronic atrial fib - on eliquis; per primary team  Hypophosphatemia secondary to CRRT - resolved Hypokalemia -  resolved Anemia normocytic: cont ESA; PRBC's per primary team    Recent Labs  Lab 07/08/23 0414 07/09/23 0513 07/10/23 0600  HGB 7.5* 7.7*  --   ALBUMIN 1.9* 1.9* 1.9*  CALCIUM 8.0* 7.6* 7.9*  PHOS 4.9* 5.1* 5.4*  CREATININE 4.93* 4.98* 5.04*  K 3.4* 4.4 4.4   Recent Labs  Lab 07/03/23 1514  IRON 35*  TIBC 179*  FERRITIN 88   Inpatient medications:  alteplase  2 mg Intracatheter Once   apixaban  5 mg Oral BID   Chlorhexidine Gluconate Cloth  6 each Topical Daily   darbepoetin (ARANESP) injection - NON-DIALYSIS  40 mcg Subcutaneous Q Fri-1800   insulin aspart  0-9 Units Subcutaneous TID WC   iron polysaccharides  150 mg Oral Daily   midodrine  10 mg Oral TID WC   multivitamin  1 tablet Oral QHS    promethazine (PHENERGAN) injection (IM or IVPB) 12.5 mg (07/08/23 1821)   acetaminophen, heparin, HYDROmorphone (DILAUDID) injection, menthol-cetylpyridinium, ondansetron (ZOFRAN) IV, mouth rinse, oxyCODONE, promethazine (PHENERGAN) injection (IM or IVPB), simethicone, sodium  chloride flush, tiZANidine, traZODone   Arita Miss, MD 10:37 AM 07/10/2023

## 2023-07-10 NOTE — TOC Transition Note (Signed)
 Transition of Care [facility]) - Discharge Note   Patient Details  Name: Calvin Schultz MRN: [id number] Date of Birth: Sep 17, 1950  [facility] CM/SW Contact:  Tom-Johnson, Hershal Coria, RN Phone Number: 07/10/2023, 11:23 AM   Clinical Narrative:     Patient is scheduled for discharge to CIR today.  Readmission Risk Assessment done. Patient will be transported via bed as In-hospital transfer to CIR.   No further TOC needs noted.        Final next level of care: IP Rehab Facility Barriers to Discharge: Barriers Resolved   Patient Goals and CMS Choice Patient states their goals for this hospitalization and ongoing recovery are:: To go to CIR for rehab and return home. CMS Medicare.gov Compare Post Acute Care list provided to:: Patient Represenative (must comment) Calvin Schultz (spouse))        Discharge Placement                Patient to be transferred to facility by: In-hospital transfer to CIR via bed      Discharge Plan and Services Additional resources added to the After Visit Summary for     Discharge Planning Services: CM Consult Post Acute Care Choice: IP Rehab          DME Arranged: N/A DME Agency: NA       HH Arranged: NA HH Agency: NA        Social Drivers of Health (SDOH) Interventions SDOH Screenings   Food Insecurity: No Food Insecurity (06/27/2023)  Housing: Low Risk  (06/27/2023)  Transportation Needs: No Transportation Needs (06/27/2023)  Utilities: Not At Risk (06/27/2023)  Social Connections: Moderately Isolated (06/27/2023)  Tobacco Use: Low Risk  (06/27/2023)     Readmission Risk Interventions    07/10/2023   11:21 AM 06/27/2023    1:00 PM  Readmission Risk Prevention Plan  Transportation Screening Complete Complete  PCP or Specialist Appt within 5-7 Days  Complete  PCP or Specialist Appt within 3-5 Days Complete   Home Care Screening  Complete  Medication Review (RN CM)  Complete  HRI or Home Care Consult Complete    Social Work Consult for Recovery Care Planning/Counseling Complete   Palliative Care Screening Not Applicable   Medication Review Oceanographer) Referral to Pharmacy

## 2023-07-10 NOTE — Progress Notes (Signed)
 Inpatient Rehab Admissions Coordinator:  There is a bed available for pt in CIR today. Dr. Marisue Humble and Dr. Benjamine Mola are aware and in agreement. Pt, pt's wife, NSG and TOC made aware.   Wolfgang Phoenix, MS, CCC-SLP Admissions Coordinator [phone]

## 2023-07-10 NOTE — H&P (Signed)
 Physical Medicine and Rehabilitation Admission H&P        Chief Complaint  Patient presents with   Groin Pain  : HPI: Calvin Schultz is a 73 year old right-handed male with a history of CKD stage III with baseline creatinine 4.9, diabetes mellitus, hypertension, hyperlipidemia, atrial fibrillation with Eliquis.  Patient with recent admission 05/30/2023 - 06/05/2023 for acute kidney injury superimposed on CKD with findings of left side hydronephrosis secondary to several calculi in the left renal collecting system with possible staghorn calculus concerning for left UPJ stricture.  A nephrostomy tube was placed on the left and patient was discharged with nephrostomy tube that was placed 1/29 as well as ongoing antibiotics transition from ceftriaxone to Bactrim.  Patient was also treated for lower extremity edema during that admission with aggressive IV Lasix diuresis.  Per chart review patient lives with spouse.  Two-level home bed and bath main level with a ramped entrance.  Independent prior to admission.  Presented 06/26/2023 to [facility] with progressive weakness as well as nausea and vomiting with diarrhea and left lower quadrant pain.  Upon arrival to the ED he was noted to be hypotensive received IV fluid resuscitation.  Initially blood pressure did respond to IV fluids however he was ultimately required to be started on norepinephrine infusion.  CT scan of the abdomen and pelvis completed concerning for colitis of the descending and sigmoid colon.  C. difficile antigen and toxin positive.  He was started on appropriate antibiotics.  He also required a rectal tube that was removed 3/1.  Admission chemistries were unremarkable except WBC 29,500, hemoglobin 8.9, sodium 129, BUN 96, creatinine 10.80, hemoglobin A1c 6.2, blood cultures no growth to date.  He was started on CRRT on 2/22 followed by renal services discontinued 2/25.  Hospital course complicated by bouts of bradycardia 2/26 and  was transferred to [facility] for further evaluation.  Currently his CRRT has been stopped no current plan for long-term hemodialysis with latest creatinine 4.93.  Nephrostomy tube remains in place per IR with plan for ureteric stent placement 3/21.  He remains on chronic Eliquis for atrial fibrillation monitoring for episodes of bradycardia with no further workup currently indicated and latest echocardiogram showing ejection fraction of 60 to 65% no wall motion abnormalities.  Therapy evaluations completed due to patient's decreased functional mobility was admitted for a comprehensive rehab program.   Review of Systems  Constitutional:  Positive for malaise/fatigue. Negative for fever.  HENT:  Negative for hearing loss.   Eyes:  Negative for blurred vision and double vision.  Respiratory:  Positive for shortness of breath. Negative for cough and wheezing.   Cardiovascular:  Positive for palpitations and leg swelling. Negative for chest pain.  Gastrointestinal:  Positive for diarrhea, nausea and vomiting.  Genitourinary:  Negative for dysuria, flank pain and hematuria.  Musculoskeletal:  Positive for myalgias.  Skin:  Negative for rash.  All other systems reviewed and are negative.       Past Medical History:  Diagnosis Date   Arthritis     CKD (chronic kidney disease) stage 3, GFR 30-59 ml/min (HCC)     Diabetes mellitus without complication (HCC)     Hyperlipidemia     Hypertension               Past Surgical History:  Procedure Laterality Date   IR NEPHROSTOMY PLACEMENT LEFT   06/03/2023   KIDNEY SURGERY  Family History  Problem Relation Age of Onset   Dementia Mother          Social History:  reports that he has never smoked. He has never used smokeless tobacco. He reports that he does not drink alcohol and does not use drugs. Allergies:  Allergies  No Known Allergies         Medications Prior to Admission  Medication Sig Dispense Refill    amLODipine (NORVASC) 5 MG tablet Take 1 tablet (5 mg total) by mouth daily. 30 tablet 0   ELIQUIS 5 MG TABS tablet Take 5 mg by mouth 2 (two) times daily.       LEVEMIR FLEXTOUCH 100 UNIT/ML FlexTouch Pen Inject 10 Units into the skin as needed (high blood sugar).       olmesartan (BENICAR) 20 MG tablet Take 20 mg by mouth daily.       rosuvastatin (CRESTOR) 40 MG tablet Take 40 mg by mouth daily.       lanthanum (FOSRENOL) 750 MG chewable tablet Chew by mouth. (Patient not taking: Reported on 06/26/2023)       sevelamer carbonate (RENVELA) 800 MG tablet Take 1 tablet (800 mg total) by mouth 3 (three) times daily with meals. (Patient not taking: Reported on 06/26/2023) 30 tablet 0              Home: Home Living Family/patient expects to be discharged to:: Private residence Living Arrangements: Spouse/significant other Available Help at Discharge: Family, Available PRN/intermittently Type of Home: House Home Access: Ramped entrance Home Layout: Two level, Able to live on main level with bedroom/bathroom Bathroom Shower/Tub: Health visitor: Standard Bathroom Accessibility: Yes Home Equipment: Agricultural consultant (2 wheels), Rollator (4 wheels), Cane - single point, BSC/3in1, Shower seat, Grab bars - toilet  Lives With: Spouse   Functional History: Prior Function Prior Level of Function : Independent/Modified Independent, Driving Mobility Comments: Ambulatory with SPC, requires assistance for STS from standard seats in restaurants, otherwise always sits in lift chair at home & uses that to transfer STS 2/2 "bad" knees. ADLs Comments: Pt. was Mod iI to I with ADLs and was assisting with daughterws care.   Functional Status:  Mobility: Bed Mobility Overal bed mobility: Needs Assistance Bed Mobility: Supine to Sit Rolling: +2 for physical assistance, Used rails, Mod assist Supine to sit: Mod assist, Used rails, HOB elevated Sit to supine: Mod assist, +2 for physical  assistance, Used rails General bed mobility comments: needed mod A for elevation of trunk to sitting and for scooting hips to EOB Transfers Overall transfer level: Needs assistance Equipment used: Ambulation equipment used Transfers: Sit to/from Stand Sit to Stand: Max assist, +2 physical assistance, From elevated surface Bed to/from chair/wheelchair/BSC transfer type:: Via Lift equipment Step pivot transfers: Min assist, +2 physical assistance (significantly extra time, assistance/cuing for RW management/turning RW, cuing re: sequencing.) Transfer via Lift Equipment: Stedy General transfer comment: pt attempted sit>stand with mod A +2 and was unable to achieve upright. Max A +2 given with bed pad under pt and he was able to come to standing but still with flexed trunk and unable to back feet up to get pressure of stedy plate off shins. Also worked on sit>stand from flaps for stedy. Needed max A +2 for this as well. Most limited by knee ROM Ambulation/Gait General Gait Details: unable to step feet in standing Pre-gait activities: maintaining standing without posterior support x10 secs   ADL: ADL Overall ADL's : Needs assistance/impaired Eating/Feeding: Independent  Grooming: Minimal assistance Upper Body Bathing: Minimal assistance Lower Body Bathing: Maximal assistance, Bed level Upper Body Dressing : Maximal assistance Lower Body Dressing: Total assistance Toilet Transfer:  (unable secondary to LE weakness and OA in knees) Toileting- Clothing Manipulation and Hygiene: Total assistance Functional mobility during ADLs:  (Max A with supine to sit and sit to supine.) General ADL Comments: Pt. is able to perform   Cognition: Cognition Orientation Level: Oriented X4 Cognition Arousal: Alert Behavior During Therapy: WFL for tasks assessed/performed   Physical Exam: Blood pressure (!) 141/77, pulse 66, temperature 98.3 F (36.8 C), temperature source Oral, resp. rate 18, height 5\' 9"   (1.753 m), weight 102.8 kg, SpO2 96%. Physical Exam   Constitutional: No apparent distress. Appropriate appearance for age.  HENT: No JVD. Neck Supple. Trachea midline. Atraumatic, normocephalic. Eyes: PERRLA. EOMI. Visual fields grossly intact.  Cardiovascular: RRR, no murmurs/rub/gallops. No Edema. Peripheral pulses 2+  Respiratory: CTAB. No rales, rhonchi, or wheezing. On RA.  Abdomen: + bowel sounds, normoactive. No distention or tenderness.  GU: Not examined. +Foley, draining clear urine. + Nephrostomy tubes, with green-tinged output.   Skin: C/D/I. No apparent lesions.  Tube sites appear clean/dry/intact.  Mepilex on left shoulder. -Buttocks MASD   MSK:      Bilateral knees L>>R tender to palpation, greater on the medial aspect, with some surrounding warmth.  No palpable effusion.  No erythema.  No range of motion deficit.       Neurologic exam:  Cognition: AAO to person, place, time and event.  Mood: Pleasant affect, appropriate mood.  Sensation: To light touch intact in BL UEs and LEs  Reflexes: 2+ in BL UE and LEs. Negative Hoffman's and babinski signs bilaterally.  CN: 2-12 grossly intact.       Strength:                RUE: 5/5 SA, 5/5 EF, 5/5 EE, 5/5 WE, 5/5 FF, 5/5 FA                LUE:  5/5 SA, 5/5 EF, 5/5 EE, 5/5 WE, 5/5 FF, 5/5 FA                RLE: 4/5 HF, 4/5 KE, 5/5  DF, 5/5  EHL, 5/5  PF                 LLE:  3/5 HF, 4-/5 KE, 5/5  DF, 5/5  EHL, 5/5  PF         Lab Results Last 48 Hours        Results for orders placed or performed during the hospital encounter of 06/26/23 (from the past 48 hours)  Glucose, capillary     Status: Abnormal    Collection Time: 07/08/23 11:28 AM  Result Value Ref Range    Glucose-Capillary 144 (H) 70 - 99 mg/dL      Comment: Glucose reference range applies only to samples taken after fasting for at least 8 hours.  Glucose, capillary     Status: Abnormal    Collection Time: 07/08/23  5:09 PM  Result Value Ref Range     Glucose-Capillary 132 (H) 70 - 99 mg/dL      Comment: Glucose reference range applies only to samples taken after fasting for at least 8 hours.  Glucose, capillary     Status: Abnormal    Collection Time: 07/08/23  9:03 PM  Result Value Ref Range    Glucose-Capillary 120 (H) 70 - 99  mg/dL      Comment: Glucose reference range applies only to samples taken after fasting for at least 8 hours.  Renal function panel (daily at 0500)     Status: Abnormal    Collection Time: 07/09/23  5:13 AM  Result Value Ref Range    Sodium 129 (L) 135 - 145 mmol/L    Potassium 4.4 3.5 - 5.1 mmol/L    Chloride 103 98 - 111 mmol/L    CO2 20 (L) 22 - 32 mmol/L    Glucose, Bld 99 70 - 99 mg/dL      Comment: Glucose reference range applies only to samples taken after fasting for at least 8 hours.    BUN 46 (H) 8 - 23 mg/dL    Creatinine, Ser 1.61 (H) 0.61 - 1.24 mg/dL    Calcium 7.6 (L) 8.9 - 10.3 mg/dL    Phosphorus 5.1 (H) 2.5 - 4.6 mg/dL    Albumin 1.9 (L) 3.5 - 5.0 g/dL    GFR, Estimated 12 (L) >60 mL/min      Comment: (NOTE) Calculated using the CKD-EPI Creatinine Equation (2021)      Anion gap 6 5 - 15      Comment: Performed at [facility], [address] [place], [place] [address]  Magnesium     Status: None    Collection Time: 07/09/23  5:13 AM  Result Value Ref Range    Magnesium 1.9 1.7 - 2.4 mg/dL      Comment: Performed at [facility], [address] [place], [place] [address]  CBC     Status: Abnormal    Collection Time: 07/09/23  5:13 AM  Result Value Ref Range    WBC 21.6 (H) 4.0 - 10.5 K/uL    RBC 2.85 (L) 4.22 - 5.81 MIL/uL    Hemoglobin 7.7 (L) 13.0 - 17.0 g/dL    HCT 11.9 (L) 14.7 - 52.0 %    MCV 90.2 80.0 - 100.0 fL    MCH 27.0 26.0 - 34.0 pg    MCHC 30.0 30.0 - 36.0 g/dL    RDW 82.9 (H) 56.2 - 15.5 %    Platelets 402 (H) 150 - 400 K/uL    nRBC 0.0 0.0 - 0.2 %      Comment: Performed at [facility], [address], [place], [place] [address]  Glucose,  capillary     Status: Abnormal    Collection Time: 07/09/23  7:29 AM  Result Value Ref Range    Glucose-Capillary 126 (H) 70 - 99 mg/dL      Comment: Glucose reference range applies only to samples taken after fasting for at least 8 hours.  Glucose, capillary     Status: Abnormal    Collection Time: 07/09/23 11:23 AM  Result Value Ref Range    Glucose-Capillary 118 (H) 70 - 99 mg/dL      Comment: Glucose reference range applies only to samples taken after fasting for at least 8 hours.  Glucose, capillary     Status: Abnormal    Collection Time: 07/09/23  4:40 PM  Result Value Ref Range    Glucose-Capillary 194 (H) 70 - 99 mg/dL      Comment: Glucose reference range applies only to samples taken after fasting for at least 8 hours.  Glucose, capillary     Status: Abnormal    Collection Time: 07/09/23  8:53 PM  Result Value Ref Range    Glucose-Capillary 146 (H)  70 - 99 mg/dL      Comment: Glucose reference range applies only to samples taken after fasting for at least 8 hours.  Renal function panel (daily at 0500)     Status: Abnormal    Collection Time: 07/10/23  6:00 AM  Result Value Ref Range    Sodium 131 (L) 135 - 145 mmol/L    Potassium 4.4 3.5 - 5.1 mmol/L    Chloride 103 98 - 111 mmol/L    CO2 21 (L) 22 - 32 mmol/L    Glucose, Bld 109 (H) 70 - 99 mg/dL      Comment: Glucose reference range applies only to samples taken after fasting for at least 8 hours.    BUN 52 (H) 8 - 23 mg/dL    Creatinine, Ser 1.61 (H) 0.61 - 1.24 mg/dL    Calcium 7.9 (L) 8.9 - 10.3 mg/dL    Phosphorus 5.4 (H) 2.5 - 4.6 mg/dL    Albumin 1.9 (L) 3.5 - 5.0 g/dL    GFR, Estimated 12 (L) >60 mL/min      Comment: (NOTE) Calculated using the CKD-EPI Creatinine Equation (2021)      Anion gap 7 5 - 15      Comment: Performed at [facility], [address], [place], [place] [address]  Magnesium     Status: None    Collection Time: 07/10/23  6:00 AM  Result Value Ref Range    Magnesium 1.9 1.7 -  2.4 mg/dL      Comment: Performed at [facility], [address] [place], [place] [address]  Glucose, capillary     Status: Abnormal    Collection Time: 07/10/23  8:01 AM  Result Value Ref Range    Glucose-Capillary 111 (H) 70 - 99 mg/dL      Comment: Glucose reference range applies only to samples taken after fasting for at least 8 hours.      Imaging Results (Last 48 hours)  No results found.         Blood pressure (!) 141/77, pulse 66, temperature 98.3 F (36.8 C), temperature source Oral, resp. rate 18, height 5\' 9"  (1.753 m), weight 102.8 kg, SpO2 96%.   Medical Problem List and Plan: 1. Functional deficits secondary to debility related to septic shock/C. difficile colitis/AKI superimposed on CKD stage IV             -patient may shower with tubes/drains covered             -ELOS/Goals: 18 to 21 days, min assist PT/OT  -Stable to admit to inpatient rehab  2.  Antithrombotics: -DVT/anticoagulation:  Pharmaceutical: Eliquis             -antiplatelet therapy: N/A  3. Pain Management: Oxycodone as needed, Zanaflex 4 mg twice daily as needed  4. Mood/Behavior/Sleep: Trazodone 50 mg nightly as needed.  Provide emotional support             -antipsychotic agents: N/A  5. Neuropsych/cognition: This patient is capable of making decisions on his own behalf.  6. Skin/Wound Care: Routine skin checks  7. Fluids/Electrolytes/Nutrition: Routine in and outs with follow-up chemistries  8.  Oliguric AKI superimposed on CKD stage IV.  Baseline creatinine 4.9.  CRRT discontinued 2/25.  No current plan for long-term dialysis.  Follow-up renal services   - Per discussion with nephrology Dr. Verna Czech, keep Foley catheter in to allow accurate I's and O's through Monday; then, can remove and do  DC Foley trial while continuing to document strict outputs  - Continue daily assessments of renal function per nephrology  9.  C. difficile colitis.  Completing course of oral vancomycin 3/7.   DC'd IV Flagyl 3/3. Enteric precautions  10.  Status post left nephrostomy tube d/t staghorn calculi.  Nephrostomy tube placed 1/29 per IR with plan for ureteric stent placement 3/21   - Careful monitoring of nephrostomy output  11.  Chronic atrial fibrillation with episodes of bradycardia.  Followed by cardiology services.  Continue Eliquis.  Avoid AV nodal blocking agents  12.  Diabetes mellitus.  Hemoglobin A1c 6.2.  SSI Recent Labs    07/10/23 0801 07/10/23 1154 07/10/23 1635  GLUCAP 111* 165* 155*     13.  Hypotension.  ProAmatine 10 mg 3 times daily.  Monitor with increased mobility    07/10/2023    7:44 PM 07/10/2023    3:25 PM 07/10/2023    8:31 AM  Vitals with BMI  Height  5\' 9"    Weight  237 lbs 11 oz   BMI  35.09   Systolic 120 108 161  Diastolic 72 61 77  Pulse 83 64 66     - Normotensive on admission 14.  Chronic anemia.  Niferex daily/Aranesp.  Monitor for any bleeding episodes  15.  Bilateral knee pain, L>>R.  Baseline manages with Tylenol.   - Tylenol 650 every 4 hours as needed   - Voltaren gel to bilateral knees 4 times daily   - R knee xray 2019 showing tricompartmental arthritis; ordered L knee xray for AM  Mcarthur Rossetti Angiulli, PA-C 07/10/2023  I have examined the patient independently and edited the note for HPI, ROS, exam, assessment, and plan as appropriate. I am in agreement with the above recommendations.   Angelina Sheriff, DO 07/10/2023

## 2023-07-10 NOTE — Progress Notes (Signed)
 Surgery orders requested with Bjorn Loser at Dr. Estil Daft office.

## 2023-07-11 ENCOUNTER — Inpatient Hospital Stay (HOSPITAL_COMMUNITY)

## 2023-07-11 DIAGNOSIS — R5381 Other malaise: Secondary | ICD-10-CM | POA: Diagnosis not present

## 2023-07-11 LAB — CBC WITH DIFFERENTIAL/PLATELET
Abs Immature Granulocytes: 0.15 10*3/uL — ABNORMAL HIGH (ref 0.00–0.07)
Basophils Absolute: 0.1 10*3/uL (ref 0.0–0.1)
Basophils Relative: 0 %
Eosinophils Absolute: 0.2 10*3/uL (ref 0.0–0.5)
Eosinophils Relative: 1 %
HCT: 23.6 % — ABNORMAL LOW (ref 39.0–52.0)
Hemoglobin: 7.1 g/dL — ABNORMAL LOW (ref 13.0–17.0)
Immature Granulocytes: 1 %
Lymphocytes Relative: 6 %
Lymphs Abs: 1 10*3/uL (ref 0.7–4.0)
MCH: 27.1 pg (ref 26.0–34.0)
MCHC: 30.1 g/dL (ref 30.0–36.0)
MCV: 90.1 fL (ref 80.0–100.0)
Monocytes Absolute: 1.7 10*3/uL — ABNORMAL HIGH (ref 0.1–1.0)
Monocytes Relative: 11 %
Neutro Abs: 13.3 10*3/uL — ABNORMAL HIGH (ref 1.7–7.7)
Neutrophils Relative %: 81 %
Platelets: 385 10*3/uL (ref 150–400)
RBC: 2.62 MIL/uL — ABNORMAL LOW (ref 4.22–5.81)
RDW: 16.4 % — ABNORMAL HIGH (ref 11.5–15.5)
WBC: 16.4 10*3/uL — ABNORMAL HIGH (ref 4.0–10.5)
nRBC: 0 % (ref 0.0–0.2)

## 2023-07-11 LAB — COMPREHENSIVE METABOLIC PANEL
ALT: 11 U/L (ref 0–44)
AST: 11 U/L — ABNORMAL LOW (ref 15–41)
Albumin: 1.7 g/dL — ABNORMAL LOW (ref 3.5–5.0)
Alkaline Phosphatase: 72 U/L (ref 38–126)
Anion gap: 9 (ref 5–15)
BUN: 54 mg/dL — ABNORMAL HIGH (ref 8–23)
CO2: 21 mmol/L — ABNORMAL LOW (ref 22–32)
Calcium: 8.1 mg/dL — ABNORMAL LOW (ref 8.9–10.3)
Chloride: 103 mmol/L (ref 98–111)
Creatinine, Ser: 5.08 mg/dL — ABNORMAL HIGH (ref 0.61–1.24)
GFR, Estimated: 12 mL/min — ABNORMAL LOW (ref >60)
Glucose, Bld: 146 mg/dL — ABNORMAL HIGH (ref 70–99)
Potassium: 4.6 mmol/L (ref 3.5–5.1)
Sodium: 133 mmol/L — ABNORMAL LOW (ref 135–145)
Total Bilirubin: 0.7 mg/dL (ref 0.0–1.2)
Total Protein: 4.8 g/dL — ABNORMAL LOW (ref 6.5–8.1)

## 2023-07-11 LAB — PREPARE RBC (CROSSMATCH)

## 2023-07-11 LAB — GLUCOSE, CAPILLARY
Glucose-Capillary: 135 mg/dL — ABNORMAL HIGH (ref 70–99)
Glucose-Capillary: 143 mg/dL — ABNORMAL HIGH (ref 70–99)
Glucose-Capillary: 136 mg/dL — ABNORMAL HIGH (ref 70–99)
Glucose-Capillary: 153 mg/dL — ABNORMAL HIGH (ref 70–99)

## 2023-07-11 LAB — ABO/RH: ABO/RH(D): O POS

## 2023-07-11 MED ORDER — SODIUM CHLORIDE 0.9% IV SOLUTION: Freq: Once | INTRAVENOUS | Status: AC

## 2023-07-11 NOTE — Progress Notes (Signed)
 Occupational Therapy Session Note  Patient Details  Name: Calvin Schultz MRN: [id number] Date of Birth: [birth year]  Today's Date: 07/11/2023 OT Individual Time: 0200-0245 OT Individual Time Calculation (min): 45 min    Short Term Goals: Week 1:     Skilled Therapeutic Interventions/Progress Updates:    Patient very lethargic during treatment, he could barely keep his eyes open.  Nursing was called in per family request in relation to questions regarding his medication regimen and his functional performance during acute stay.  The was provided a face clothe to wash his face and was able to eat a sandwich in long sitting. Initially, I was unable to get the pt to raise his arm, but his was able to raise BUE during meal consumption and when retrieving his beverage. The pt went on to complete UB exercise by grasping the bed rails on both sides and long sitting 5x while maintaining position for a count of 10 with rest breaks as needed. The pt required 1 rest break at the end of each count. The pt was able to grasp the bed rails for repositioning and placement of his pillows with MaxA for mobility.  At the end of the session,the pt remained at bed LOF with his call light and bedside table within reach  and all additional needs addressed prior to me exiting the room.   Therapy Documentation Precautions:  Precautions Precautions: Fall Precaution/Restrictions Comments: L nephrostomy tube, foley catheter Restrictions Weight Bearing Restrictions Per Provider Order: No General: General Chart Reviewed: Yes Family/Caregiver Present: Yes (Spouse) Vital Signs: Therapy Vitals Temp: (!) 94.2 F (34.6 C) Pulse Rate: 63 BP: 110/69 Patient Position (if appropriate): Lying Oxygen Therapy SpO2: 100 % O2 Device: Room Air Patient Activity (if Appropriate): In bed Pulse Oximetry Type: Intermittent Pain: Pain Assessment Pain Scale: 0-10 Multiple Pain Sites: No ADL:   Vision   Perception     Praxis Praxis: WFL Balance   Exercises:   Other Treatments:     Therapy/Group: Individual Therapy  Lavona Mound 07/11/2023, 4:11 PM

## 2023-07-11 NOTE — Progress Notes (Signed)
 Inpatient Rehabilitation Admission Medication Review by a Pharmacist  A complete drug regimen review was completed for this patient to identify any potential clinically significant medication issues.  High Risk Drug Classes Is patient taking? Indication by Medication  Antipsychotic No   Anticoagulant Yes Apixaban - chronic Afib  Antibiotic No   Opioid Yes Oxycodone - pain  Antiplatelet No   Hypoglycemics/insulin Yes Insulin aspart SSI  - DM  Vasoactive Medication Yes Midodrine - blood pressure  Chemotherapy No   Other Yes Acetaminophen, diclofenac gel , tizanidine - pain management  Aranesp -normocytic anemia  Niferex - iron supplement Multivitamin - supplement Trazodone - sleep/ mood/ behavior     Type of Medication Issue Identified Description of Issue Recommendation(s)  Drug Interaction(s) (clinically significant)     Duplicate Therapy     Allergy     No Medication Administration End Date     Incorrect Dose     Additional Drug Therapy Needed     Significant med changes from prior encounter (inform family/care partners about these prior to discharge). PTA medications:   Amlodipine,  Levemir insulin, olmesartan were discontinued on discharge from acute care.  Lanthanum and sevelamer patient reported as NOT taking prior to acute admit and were discontinued on the acute admission discharge orders.  Restart PTA meds when and if necessary during CIR admission or at time of discharge, if warranted   Other       Clinically significant medication issues were identified that warrant physician communication and completion of prescribed/recommended actions by midnight of the next day:  No  Name of provider notified for urgent issues identified:   Provider Method of Notification:     Pharmacist comments:   Time spent performing this drug regimen review (minutes):  25   Noah Delaine, RPh Clinical Pharmacist 07/11/2023 12:27 PM

## 2023-07-11 NOTE — Discharge Summary (Signed)
 Physician Discharge Summary  Patient ID: Calvin Schultz MRN: [id number] DOB/AGE: 71-Mar-1952 71 y.o.  Admit date: 07/10/2023 Discharge date: 08/21/2023  Discharge Diagnoses:  Principal Problem:   Right ureteral calculus Active Problems:   Acute kidney injury superimposed on chronic kidney disease (HCC)   Chronic atrial fibrillation (HCC)   Normocytic anemia   Metabolic acidosis   Debility   Pressure injury of skin   Leukocytosis   History of Clostridium difficile colitis   Controlled diabetes mellitus type 2 with complications (HCC)   Acute cystitis with hematuria   C. difficile diarrhea   Depression with anxiety   Hypokalemia   Hypomagnesemia   Longstanding persistent atrial fibrillation (HCC)   Typical atrial flutter (HCC)   Acute blood loss anemia   Gross hematuria   Malnutrition of moderate degree DVT prophylaxis Septic shock Mood stabilization/insomnia Oliguric AKI superimposed on CKD stage IV C. difficile colitis Status post left nephrostomy tube D/T staghorn calculi Chronic atrial fibrillation with episodes of bradycardia Diabetes mellitus Chronic anemia Hypotension Leukocytosis UTI Hematuria Decreased nutritional storage Chronic osteoarthritis bilateral knees.  Discharged Condition: Guarded  Significant Diagnostic Studies: DG C-Arm 1-60 Min-No Report Result Date: 08/18/2023 Fluoroscopy was utilized by the requesting physician.  No radiographic interpretation.   DG C-Arm 1-60 Min-No Report Result Date: 08/18/2023 Fluoroscopy was utilized by the requesting physician.  No radiographic interpretation.   CT RENAL STONE STUDY Result Date: 08/12/2023 CLINICAL DATA:  Stone burden eval, post treatment EXAM: CT ABDOMEN AND PELVIS WITHOUT CONTRAST TECHNIQUE: Multidetector CT imaging of the abdomen and pelvis was performed following the standard protocol without IV contrast. RADIATION DOSE REDUCTION: This exam was performed according to the departmental dose-optimization  program which includes automated exposure control, adjustment of the mA and/or kV according to patient size and/or use of iterative reconstruction technique. COMPARISON:  CT abdomen pelvis 08/05/2023 FINDINGS: Lower chest: Bilateral lower lobe atelectasis. Hepatobiliary: No focal liver abnormality. Calcified gallstone noted within the gallbladder lumen. No gallbladder wall thickening or pericholecystic fluid. No biliary dilatation. Pancreas: Diffusely atrophic. No focal lesion. Otherwise normal pancreatic contour. No surrounding inflammatory changes. No main pancreatic ductal dilatation. Spleen: Normal in size without focal abnormality. Adrenals/Urinary Tract: No adrenal nodule bilaterally. Bilateral ureteral stents in appropriate position with the proximal pigtail of the left stent within a left superior renal pole calyx. Associated focal calyceal dilatation adjacent to this. Left nephrolithiasis measuring up to 1.7 cm. A 0.6 cm right distal ureterolithiasis. Associated moderate right hydroureter. No ureterolithiasis or hydroureter. The urinary bladder is decompressed with Foley catheter tip within the urinary bladder lumen. Inflated Foley catheter balloon not visualized. Stomach/Bowel: Stomach is within normal limits. No evidence of small bowel wall thickening or dilatation. Interval development of diffuse large bowel wall thickening and pericolonic fat stranding. Appendix appears normal. Vascular/Lymphatic: No abdominal aorta or iliac aneurysm. Severe atherosclerotic plaque of the aorta and its branches. No abdominal, pelvic, or inguinal lymphadenopathy. Reproductive: Prostate is unremarkable. Other: Small volume simple intraperitoneal free fluid. No intraperitoneal free gas. No organized fluid collection. Musculoskeletal: Diffuse subcutaneus soft tissue edema. No suspicious lytic or blastic osseous lesions. No acute displaced fracture. Multilevel severe degenerative changes of the spine. IMPRESSION: 1.  Pancolitis. 2. Small volume simple free fluid ascites. 3. Left nephrolithiasis measuring up to 1.7 cm. 4. Obstructive 0.6 cm right distal ureterolithiasis adjacent to the ureteral stent. 5. Bilateral ureteral stents. 6. Cholelithiasis no evidence of acute cholecystitis. 7.  Aortic Atherosclerosis (ICD10-I70.0). Electronically Signed   By: Morgane  Naveau M.D.   On:  08/12/2023 00:41   DG C-Arm 1-60 Min Result Date: 08/05/2023 CLINICAL DATA:  Cystoscopy and retrograde pyelogram EXAM: DG C-ARM 1-60 MIN COMPARISON:  08/05/2023 FINDINGS: Eight fluoroscopic images are obtained during the performance of the procedure and are provided for interpretation only. The right ureter is cannulated and opacified, with placement of a right ureteral stent. Proximal aspect coiled within the right renal pelvis and distal aspect coiled within the bladder. Please refer to the operative report. Fluoroscopy time: 1 minute 59 seconds, 52.14 mGy IMPRESSION: 1. Intraoperative exam as above. Please refer to the operative report. Electronically Signed   By: Bobbye Burrow M.D.   On: 08/05/2023 20:28   CT ABDOMEN PELVIS WO CONTRAST Result Date: 08/05/2023 CLINICAL DATA:  73 year old male with gross hematuria. No nephrolithiasis. Left nephrostomy in February. EXAM: CT ABDOMEN AND PELVIS WITHOUT CONTRAST TECHNIQUE: Multidetector CT imaging of the abdomen and pelvis was performed following the standard protocol without IV contrast. RADIATION DOSE REDUCTION: This exam was performed according to the departmental dose-optimization program which includes automated exposure control, adjustment of the mA and/or kV according to patient size and/or use of iterative reconstruction technique. COMPARISON:  Noncontrast CT Abdomen and Pelvis 06/26/2023. FINDINGS: Lower chest: Stable cardiomegaly. No pericardial effusion. But small volume simple fluid density right pleural effusion is new since February. Contralateral left lung base fibrothorax and pleural  thickening appears unchanged. Hepatobiliary: Cholelithiasis. Trace new perihepatic free fluid with simple fluid density. Mildly nodular liver contour again noted. No discrete liver lesion in the absence of contrast. Pancreas: Atrophied. Spleen: Stable and within normal limits. Adrenals/Urinary Tract: Adrenal glands remain within normal limits. Left side percutaneous nephrostomy seen in February has been removed but a left double-J ureteral stent is in place with appropriate positioning. Superimposed developing staghorn calculus in the left midpole renal calices is noted and there is moderate increased left hydronephrosis with hyperdense fluid within the dilated collecting system (series 6, image 41). Chronic but increased perinephric soft tissue stranding. No calculus identified along the course of the double-J stent. Contralateral new moderate to severe right hydronephrosis and severe right hydroureter (up to 2 cm diameter). And there is hyperdense fluid, layering hematocrit also within the dilated right renal collecting system and pelvis (series 3, image 39). Increased right pararenal space inflammation and stranding. The right ureter remains dilated to the level of the pelvic inlet where a large obstructing 7-8 mm diameter calculus is noted on series 3, image 70 and coronal image 53. Distal to that the right ureter is decompressed to the bladder. Foley catheter and distal double-J stent are present within the bladder. Small volume of gas within the bladder. Bladder wall is mildly thickened and indistinct. Stomach/Bowel: Increased large bowel retained stool since February, mild-to-moderate. No dilated large or small bowel loops. Evidence that the appendix remains diminutive and normal on series 3, image 59. Small volume retained fluid in the stomach. No pneumoperitoneum. Bilateral retroperitoneal, pericolic gutter inflammatory stranding which probably is emanating from the renal spaces. This is confluent in the  left lower quadrant series 3, image 72, but adjacent bowel seems unaffected. Vascular/Lymphatic: Aortoiliac calcified atherosclerosis. Normal caliber abdominal aorta. Vascular patency is not evaluated in the absence of IV contrast. No lymphadenopathy identified. Reproductive: Urethral catheter in place. Other: Mild to moderate presacral edema. Musculoskeletal: Moderate to severe new bilateral body wall, flank subcutaneous edema. Widespread lower thoracic through upper lumbar spinal ankylosis from bridging endplate osteophytes. No acute osseous abnormality identified. IMPRESSION: 1. Bilateral Acute Obstructive Uropathy with evidence of bilateral  collecting system Hematuria. Large 7-8 mm diameter obstructing calculus of the right ureter at the pelvic inlet. New left hydronephrosis despite normally positioned left double-J ureteral stent. Left nephrostomy tube has been removed since February. Underlying left renal staghorn calculus. Associated bilateral pararenal space inflammation tracking in the retroperitoneum to the pelvis. 2. Foley catheter and distal double-J stent within the Urinary Bladder which appears mildly inflamed. 3. Superimposed new Anasarca: Body wall edema, trace perihepatic ascites (see #4), trace layering right pleural effusion. 4. Nodular liver contour raising the possibility of Cirrhosis. Chronic cholelithiasis. 5. Cardiomegaly. Left lung base fibrothorax. Aortic Atherosclerosis (ICD10-I70.0). Electronically Signed   By: Marlise Simpers M.D.   On: 08/05/2023 06:36   DG Knee Complete 4 Views Right Result Date: 07/31/2023 CLINICAL DATA:  Right knee pain. EXAM: RIGHT KNEE - COMPLETE 4+ VIEW COMPARISON:  None Available. FINDINGS: No evidence of fracture or dislocation. Small suprapatellar joint effusion is noted. Severe degenerative changes seen involving the medial joint space. Severe degenerative changes also seen involving patellofemoral space with osteophyte formation. Mild degenerative changes seen  involving lateral joint space. Soft tissues are unremarkable. IMPRESSION: Severe tricompartmental degenerative joint disease. Small suprapatellar joint effusion. No fracture or dislocation. Electronically Signed   By: Rosalene Colon M.D.   On: 07/31/2023 20:35   DG C-Arm 1-60 Min-No Report Result Date: 07/24/2023 Fluoroscopy was utilized by the requesting physician.  No radiographic interpretation.   DG C-Arm 1-60 Min-No Report Result Date: 07/24/2023 Fluoroscopy was utilized by the requesting physician.  No radiographic interpretation.    Labs:  Basic Metabolic Panel: Recent Labs  Lab 08/14/23 0611 08/15/23 1300 08/16/23 0949 08/18/23 0529 08/18/23 0926 08/18/23 1351 08/19/23 0520 08/20/23 0518  NA 131* 131* 133* 133*  --  136 134* 137  K 3.6 3.4* 3.4* 2.9*  --  3.6 3.7 3.5  CL 107 105 107 109  --  112* 116* 114*  CO2 13* 14* 13* 12*  --   --  11* 12*  GLUCOSE 86 108* 88 118*  --  87 125* 105*  BUN 73* 70* 71* 68*  --  68* 65* 66*  CREATININE 5.49* 5.85* 5.85* 6.02*  --  6.90* 5.78* 5.92*  CALCIUM  8.1* 7.7* 8.0* 8.0*  --   --  7.7* 8.1*  MG 2.0  --   --   --  1.8  --   --   --   PHOS  --   --  7.2* 6.9*  --   --  7.3* 6.9*    CBC: Recent Labs  Lab 08/15/23 1300 08/18/23 0529 08/18/23 1351 08/20/23 0518  WBC 24.8* 17.7*  --  21.6*  HGB 8.6* 7.9* 9.9* 8.0*  HCT 27.5* 25.5* 29.0* 26.0*  MCV 84.1 84.7  --  87.0  PLT 289 261  --  243    CBG: Recent Labs  Lab 08/18/23 1306 08/18/23 1643 08/19/23 0834 08/19/23 2037 08/20/23 0545  GLUCAP 86 104* 107* 155* 103*   Family history.  Mother with dementia.  Denies any colon cancer esophageal cancer or rectal cancer  Brief HPI:   Torrell Krutz is a 73 y.o. right-handed male with history of CKD stage III with baseline creatinine 4.9 diabetes mellitus hypertension hyperlipidemia atrial fibrillation with Eliquis .  Patient with recent admission 05/30/2023 - 06/05/2023 for acute kidney injury superimposed on CKD with findings of  left side hydronephrosis secondary to several calculi in the left renal collecting system with possible staghorn calculus concerning for left UPJ stricture.  A  nephrostomy tube was placed on the left and patient was discharged with nephrostomy tube that was placed 1/29 as well as ongoing antibiotic transition from ceftriaxone  to Bactrim .  Patient was also treated for lower extremity edema during that admission with aggressive IV Lasix  diuresis.  Per chart review patient lives with spouse.  Two-level home bed and bath main level with a ramped entrance.  Independent prior to admission.  Presented 06/26/2023 to [facility] with progressive weakness as well as nausea and vomiting with diarrhea and left lower lobe quadrant pain.  Upon arrival to the ED he was noted to be hypotensive received IV fluid resuscitation.  Initially blood pressure did respond to IV fluids however he was ultimately required to be started on norepinephrine  infusion.  CT scan of the abdomen and pelvis completed concerning for colitis of the descending and sigmoid colon.  C. difficile antigen and toxin positive.  He was started on appropriate antibiotics.  He also required a rectal tube that was removed 3/1.  Admission chemistries unremarkable except WBC 29,500 hemoglobin 8.9 sodium 129 BUN 96 creatinine 10.80 hemoglobin A1c 6.2 blood cultures no growth to date.  He was started on CRRT on 2/22 follow-up by renal services discontinued 2/25.  Hospital course complicated by bouts of bradycardia 2/26 transferred to [facility] for further evaluation.  Currently his CRRT has been stopped no current plan for long-term hemodialysis latest creatinine 4.93.  Nephrostomy tube remained in place per IR with plan for ureteric stent placement 3/21.  He remained on chronic Eliquis  for atrial fibrillation monitoring for episodes of bradycardia no further workup indicated latest echocardiogram showing ejection fraction of 60 to 65% no wall motion  abnormalities.  Therapy evaluations completed due to patient decreased functional mobility was admitted for a comprehensive rehab program.   Hospital Course: Maahir Fariss was admitted to rehab 07/10/2023 for inpatient therapies to consist of PT, ST and OT at least three hours five days a week. Past admission physiatrist, therapy team and rehab RN have worked together to provide customized collaborative inpatient rehab.  Pertaining to patient's debility related to septic shock/C. difficile colitis/AKI superimposed on CKD stage IV remained stable.  He remained afebrile.  Completed course of oral vancomycin  for C. difficile colitis with enteric precautions monitoring of leukocytosis 53,000 transitioned to Dificid  with latest WBC 17,700.  And his latest bowel movements are much more firm with no diarrhea.  Followed by renal services for oliguric AKI superimposed on CKD stage IV baseline creatinine 4.9 and followed closely by renal services and patient currently is maintained on sodium bicarbonate .  He did experience some lower extremity edema responding well to IV Lasix  transition to p.o. and has since been discontinued.  History of left nephrostomy tube due to staghorn calculi nephrostomy tube placed 1/29 per IR and underwent cystoscopy with left retrograde pyelogram, laser lithotripsy and left ureteral stent placement with nephrostomy tube removal 07/24/2023 per Dr. Derrick Fling.  Patient did have urinary retention with hematuria necessitating the need for placement of indwelling Foley catheter tube.  CT of the abdomen and pelvis 08/05/2023 showed bilateral acute obstructive uropathy with evidence of bilateral collecting system hematuria.  Large 7-8 mm diameter obstructing calculus of the right ureter at the pelvic inlet.  New left hydronephrosis despite normally positioned left double-J ureteral stent.  Cystoscopy with bilateral  ureteroscopy laser lithotripsy and stent exchange completed 08/18/2023 per urology for right  ureteral stone, left renal stone.  Superimposed new anasarca/body wall edema trace perihepatic ascites trace layering  right pleural effusion.  Latest urinalysis study showed bacteria rare with WBCs greater than 50 as well as noted increasing leukocytosis 24,400 and placed empirically on Keflex .  History of atrial fibrillation cardiac rate controlled followed by cardiology services remained on Eliquis  however patient did have some hematuria and Eliquis  was held for due to hematuria and cardiology service is considering left atrial appendage occlusion device after discharge..  Blood sugars overall controlled hemoglobin A1c 6.2.  Monitoring of blood pressure has been soft initially on ProAmatine  and monitored.  Chronic anemia with Niferex as well as Aranesp  and monitoring closely CBC due to hematuria with latest hemoglobin 7.9.  Mood stabilization with use of trazodone  at nighttime providing emotional support.  Patient with persistent bilateral osteoarthritis of the knees and did receive joint injections.   Blood pressures were monitored on TID basis and remained soft and monitored  Diabetes has been monitored with ac/hs CBG checks and SSI was use prn for tighter BS control.    Rehab course: During patient's stay in rehab weekly team conferences were held to monitor patient's progress, set goals and discuss barriers to discharge. At admission, patient required max assist +2 physical assist sit to stand moderate assist supine to sit  Physical exam.  Blood pressure 141/77 pulse 66 temperature 98.3 respirations 18 oxygen saturation 96% room air Constitutional.  No acute distress HEENT Head.  Normocephalic and atraumatic Eyes.  Pupils round and reactive to light no discharge without nystagmus Neck.  Supple nontender no JVD without thyromegaly Cardiac regular rate and rhythm without any extra sounds or murmur heard Abdomen.  Soft nontender positive bowel sounds without rebound Respiratory effort normal no  respiratory distress without wheeze Skin.  C/D/I.  Buttocks MASD Neurologic.  Alert and oriented pleasant affect appropriate mood Manual muscle testing left upper and right upper extremity 5/5 Right lower extremity 4/5 HF 4/5 KE otherwise 5/5 Left lower extremity 3/5 HF 4 -/5 KE 5/5 DF 5/5 EHL 5/5 PF  He/She  has had improvement in activity tolerance, balance, postural control as well as ability to compensate for deficits. He/She has had improvement in functional use RUE/LUE  and RLE/LLE as well as improvement in awareness.  Perform supine to sit, and sit to supine with use of bed features and minimal assist for management of trunk for supine to sit, and moderate assist for management bilateral lower extremities for sit to supine-verbal cues provided for technique and sequencing and for initiation.  Wife worked with downhill Film/video editor transfers.  Teaching ongoing for power wheelchair and safety and mobility.  Wife assisted patient in all areas of ADLs completed hands-on toileting and dressing assist at bed level.  Wife also assisted with all aspects of dressing and toilet hygiene including cleaning, brief change, bed mobility and lower body dressing.  Extensive teaching ongoing with wife.  Full family teaching completed plan discharge to home.       Disposition:  Discharge disposition: 06-Home-Health Care Svc        Diet: Regular  Special Instructions: No driving smoking or alcohol  Continue to hold Eliquis  until further notice until hematuria resolved    Medications at discharge 1.  Tylenol  as needed 2.  Metamucil 1 packet twice daily 3.  Voltaren  gel 2 g 4 times daily 4.  Niferex 150 mg p.o. daily 5.  ProAmatine  5 mg p.o. 3 times daily as needed orthostatic hypotension 6.  Rena-Vite 1 tablet nightly 7.  Hydrocodone  5-325 mg 1 tablet every 6 hours as needed severe  pain 8.  Zanaflex  4 mg p.o. twice daily as needed muscle spasms 9.  Trazodone  75 mg p.o. nightly sleep 10.   Voltaren  gel 2 g 4 times daily 11.  Flomax  0.8 mg daily after supper 12.  Tramadol  50 mg twice daily as needed 13.  Sodium bicarbonate  1300 mg 3 times daily   30-35 minutes were spent completing discharge summary and discharge planning     Follow-up Information     Lylia Sand, MD Follow up.   Specialty: Physical Medicine and Rehabilitation Why: No formal follow-up needed Contact information: [address] [place] [address] [phone]         Chucky Craver, MD Follow up.   Specialty: Nephrology Why: Call for appointment Contact information: [address] [place] [address] [phone]         Christina Coyer, MD Follow up on 09/04/2023.   Specialty: Urology Why: at 9 AM with NP [facility] information: [address] [place] [address] [phone]         Olinda Bertrand, DO Follow up.   Specialties: Cardiology, Vascular Surgery Why: call for appointment Contact information: [address] [place] [address] [phone]                 Signed: Sterling Eisenmenger 08/21/2023, 5:09 AM

## 2023-07-11 NOTE — Evaluation (Signed)
 Occupational Therapy Assessment and Plan  Patient Details  Name: Calvin Schultz MRN: [id number] Date of Birth: 1951-04-11  OT Diagnosis: muscle weakness (generalized), pain in joint, and swelling of limb Rehab Potential: Rehab Potential (ACUTE ONLY): Good ELOS: 3wks   Today's Date: 07/11/2023 OT Individual Time: 1045-1200 OT Individual Time Calculation (min): 75 min     Hospital Problem: Principal Problem:   Debility   Past Medical History:  Past Medical History:  Diagnosis Date   Arthritis    CKD (chronic kidney disease) stage 3, GFR 30-59 ml/min (HCC)    Diabetes mellitus without complication (HCC)    Hyperlipidemia    Hypertension    Past Surgical History:  Past Surgical History:  Procedure Laterality Date   IR NEPHROSTOMY PLACEMENT LEFT  06/03/2023   KIDNEY SURGERY      Assessment & Plan Clinical Impression: Patient Admitting Diagnosis: AKI, debility History of Present Illness: Pt is a  73 year old male with past medical history as below, which is significant for atrial fibrillation on Eliquis, hypertension, diabetes mellitus, CKD, and recently admitted for acute on chronic renal failure with left-sided hydronephrosis secondary to several calculi in the left renal collecting system with possible staghorn calculus concerning for left UPJ stricture. Nephrostomy tube was placed on the left and the patient was discharged with the nephrostomy tube as well as ongoing antibiotics transitioned from ceftriaxone to Bactrim p.o. Also treated for lower extremity edema during that admission with aggressive IV Lasix diuresis. Initial post discharge course was unremarkable, but ultimately the patient began to experience worsening weakness associated with nausea, vomiting, and diarrhea as well as left lower quadrant pain. He has some difficulty walking at baseline due to knee pain but as a result of the mentioned symptoms he became unable to walk at all due to weakness. This is when his family  called EMS and he was transported to [facility] emergency department on 2/06/06/23. Upon arrival to the emergency department he was noted to be hypotensive and IV fluid resuscitation was initiated. Initially blood pressure did respond to IV fluids, however, he was ultimately required to be started on norepinephrine infusion. Workup in the ED included CT scan which was concerning for colitis of the descending and sigmoid colon. C. difficile antigen and toxin positive. He was started on appropriate antibiotics. PCCM was asked to evaluate in the setting of hypotension. Pt. Was transferred to [facility] 07/01/23. Nephrology following, iHD started. He was seen by PT/OT/SLP and they recommend CIR to assist return to PLOF.    Patient's medical record from [facility]  has been reviewed by the rehabilitation admission coordinator and physician.  Patient transferred to CIR on 07/10/2023 .    Patient currently requires min to dependent with basic self-care skills  for UB/LB task performance secondary to muscle weakness and muscle joint tightness.  Prior to hospitalization, patient could complete BADL in UB/LB  with modified independent  incorporating a single prong cane for assistance with functional balance.  Patient will benefit from skilled intervention to decrease level of assist with basic self-care skills prior to discharge home with care partner.  Anticipate patient will require intermittent supervision and follow up home health.  OT - End of Session Activity Tolerance: Tolerates 30+ min activity with multiple rests Endurance Deficit: Yes Endurance Deficit Description: Frequent rest breaks OT Assessment Rehab Potential (ACUTE ONLY): Good OT Barriers to Discharge: Hemodialysis OT Patient demonstrates impairments in the following area(s): Endurance;Balance (functional mobility) OT Basic ADL's Functional Problem(s):  Dressing;Bathing;Toileting OT Advanced ADL's Functional  Problem(s): Simple Meal Preparation;Light Housekeeping OT Transfers Functional Problem(s): Toilet;Tub/Shower OT Additional Impairment(s): Other (comment) (challenges with flexion/extension of the knees and carrying weight through the extremities.) OT Plan OT Intensity: Minimum of 1-2 x/day, 45 to 90 minutes OT Frequency: Total of 15 hours over 7 days of combined therapies OT Duration/Estimated Length of Stay: 3wks OT Treatment/Interventions: Self Care/advanced ADL retraining;Therapeutic Exercise;UE/LE Coordination activities;Therapeutic Activities;UE/LE Strength taining/ROM;Functional mobility training;DME/adaptive equipment instruction;Balance/vestibular training OT Self Feeding Anticipated Outcome(s): s/u A OT Basic Self-Care Anticipated Outcome(s): ModI OT Toileting Anticipated Outcome(s): ModI (with proper equipment and consistent improvements with functional mobility) OT Bathroom Transfers Anticipated Outcome(s): ModI (with functional gains in mobility) OT Recommendation Patient destination: Home Follow Up Recommendations: Other (comment) (To be determined prior to d/c based on performance.) Equipment Recommended: Tub/shower bench;3 in 1 bedside comode;Sliding board   OT Evaluation Precautions/Restrictions  Precautions Precautions: Fall Precaution/Restrictions Comments: L nephrostomy tube, foley catheter Restrictions Weight Bearing Restrictions Per Provider Order: No General Chart Reviewed: Yes Family/Caregiver Present: Yes Vital Signs Pulse 79, BP 106/65,02 97%,HR78   Pain Pain Assessment Pain Scale: 0-10 Home Living/Prior Functioning Home Living Family/patient expects to be discharged to:: Private residence Living Arrangements: Spouse/significant other Available Help at Discharge: Family, Available PRN/intermittently (She works) Type of Home: House Home Access: Ramped entrance (Patient typically enters through garage with ramp into main level, which is also his living  quarters.) Home Layout: Two level, Able to live on main level with bedroom/bathroom Alternate Level Stairs-Number of Steps: 4 stair at the front of the home with landing and a rise of 5 to 6 inches into the house Bathroom Shower/Tub: Health visitor: Standard Bathroom Accessibility: Yes Additional Comments: Patient's wife indicated that the patient needs a shower bench  Lives With: Spouse IADL History Homemaking Responsibilities: Yes (patient was able to prepare simple meal and complete some household task) Meal Prep Responsibility: Secondary Cleaning Responsibility: Secondary Current License: Yes Mode of Transportation: Car (Patient reports driving if he needed to) Occupation: Retired Type of Occupation: Counsellor Leisure and Hobbies: enjoys watching tv Prior Function Level of Independence: Requires assistive device for independence (patient reported as using a single prong cane) Driving: Yes Vision Baseline Vision/History: 1 Wears glasses (readers) Ability to See in Adequate Light: 0 Adequate Patient Visual Report: No change from baseline Perception  Perception: Within Functional Limits Praxis   Cognition Cognition Overall Cognitive Status: Within Functional Limits for tasks assessed Arousal/Alertness: Lethargic (family reported pain meds and muscle relaxer prior to arrival) Orientation Level: Person;Place Memory: Impaired Memory Impairment: Retrieval deficit (short-term) Attention:  (appeared fatigue) Awareness: Appears intact Problem Solving: Appears intact Safety/Judgment: Appears intact Brief Interview for Mental Status (BIMS) Repetition of Three Words (First Attempt): 2 Temporal Orientation: Year: Correct Temporal Orientation: Month: Accurate within 5 days Temporal Orientation: Day: Incorrect Recall: "Sock": No, could not recall Recall: "Blue": Yes, after cueing ("a color") Recall: "Bed": Yes, no cue required BIMS Summary Score:  10 Sensation Sensation Light Touch: Appears Intact (able to detect external stimuli with vision occluded) Coordination Gross Motor Movements are Fluid and Coordinated: No (Patient present with swelling and joint change with bilateral knees impacting his ability to ambulate or bear weight at the time of this evaluation.) Fine Motor Movements are Fluid and Coordinated: No (Patient presents with edema associated with the LUE and he presented with limited AROM during the evaluation.) Coordination and Movement Description: Generalized weakness, debility, and bilateral knee pain limiting movement Motor  Motor Motor: Within Functional Limits  Trunk/Postural Assessment  Cervical Assessment Cervical Assessment: Exceptions to [facility] (Patient presents was very lethargic at the time of this assessment and he presented with challenges in relation to making adjustments in his anatomical positioning.) Thoracic Assessment Thoracic Assessment: Exceptions to [facility] Lumbar Assessment Lumbar Assessment: Exceptions to [facility] Postural Control Postural Control: Deficits on evaluation  Balance Balance Balance Assessed: No (patient able to sit upright with the support of the bed rail.  The pt is unable to flex BLE which results in a increase risk for falls.) Static Sitting Balance Static Sitting - Balance Support: Bilateral upper extremity supported (Patient was able to complete long sitting holding on to the bed rails for additional balance.) Static Sitting - Level of Assistance: 5: Stand by assistance Dynamic Sitting Balance Dynamic Sitting - Level of Assistance: 3: Mod assist;2: Max assist Dynamic Sitting Balance - Compensations: incorporating the grab bars. Sitting balance - Comments: supervision static sitting, needed frequent vc's for fwd wt shift Extremity/Trunk Assessment RUE Assessment RUE Assessment: Within Functional Limits Passive Range of Motion (PROM) Comments: WFL Active Range of Motion (AROM)  Comments: Patient is able to bring his arm into shld flexion of 70-75 degree with limitation for maintaining position. General Strength Comments: 3/5MMT LUE Assessment LUE Assessment: Within Functional Limits Passive Range of Motion (PROM) Comments: WFL Active Range of Motion (AROM) Comments: Patient is able to demonstrate AROM of about 70-75 degree without maintaining position over time General Strength Comments: 3/5MMT  Care Tool Care Tool Self Care Eating   Eating Assist Level: Set up assist    Oral Care    Oral Care Assist Level: Set up assist    Bathing Bathing activity did not occur:  (patient completed a simulated task at bed LOF) Body parts bathed by patient: Right arm;Left arm;Chest;Abdomen;Front perineal area;Right upper leg;Left upper leg;Face Body parts bathed by helper: Buttocks;Left lower leg;Right lower leg   Assist Level: Maximal Assistance - Patient 24 - 49%    Upper Body Dressing(including orthotics)   What is the patient wearing?: Pull over shirt   Assist Level: Minimal Assistance - Patient > 75%    Lower Body Dressing (excluding footwear)   What is the patient wearing?: Pants;Underwear/pull up;Incontinence brief Assist for lower body dressing: Maximal Assistance - Patient 25 - 49%    Putting on/Taking off footwear   What is the patient wearing?: Socks;Shoes Assist for footwear: Dependent - Patient 0%       Care Tool Toileting Toileting activity   Assist for toileting: Dependent - Patient 0%     Care Tool Bed Mobility Roll left and right activity   Roll left and right assist level: Dependent - Patient 0%    Sit to lying activity   Sit to lying assist level: Maximal Assistance - Patient 25 - 49%    Lying to sitting on side of bed activity   Lying to sitting on side of bed assist level: the ability to move from lying on the back to sitting on the side of the bed with no back support.:  (didn't occur secondary to inability to flex BLE secodary to  swelling of bilateral knee joints.)     Care Tool Transfers Sit to stand transfer Sit to stand activity did not occur: Safety/medical concerns      Chair/bed transfer   Chair/bed transfer assist level: Total Assistance - Patient < 25%     Toilet transfer Toilet transfer activity did not occur: Safety/medical concerns       Care Tool Cognition  Expression of Ideas and Wants Expression of Ideas and Wants: 4. Without difficulty (complex and basic) - expresses complex messages without difficulty and with speech that is clear and easy to understand  Understanding Verbal and Non-Verbal Content Understanding Verbal and Non-Verbal Content: 4. Understands (complex and basic) - clear comprehension without cues or repetitions   Memory/Recall Ability Memory/Recall Ability :  (Patient was able to demonstrate recall in most instance.)   Refer to Care Plan for Long Term Goals  SHORT TERM GOAL WEEK 1 OT Short Term Goal 1 (Week 1): The pt will transfer from supine in bed to chair LOF with ModA using a sliding board x2 at 95% safe. OT Short Term Goal 2 (Week 1): The pt will complete UB dressing with s/u A at 95% safe OT Short Term Goal 3 (Week 1): The pt will complete LB bathing with  MinA incorporating AE at 95% safe OT Short Term Goal 4 (Week 1): The pt will complete LB dressing with MinA incorporating AE at 95% safe OT Short Term Goal 5 (Week 1): The pt will tolerate > 45 minutes of OT activity at 95% safe.  Recommendations for other services: None    Skilled Therapeutic Intervention  Patient seen this day for skilled OT evaluation, patient very lethargic at the time of the evaluation secondary to not sleeping well on the night before and recent administration of medications. Patient in good spirits overall with a willingness to participate. Patient spouse present at the time of evaluation. Patient met with challenges for LB function secondary to swelling of bilateral knees impacting functional  transfers to all surfaces. Base on observation of the patient during simulated task,  I was able to determine that he requires MinA with UB bathing and dressing  secondary to limitation with AROM of bilateral UE during the execution of over head task performance. The pt presented with limited functional mobility for BLE impacting bathing and dressing, which resulted a performance level of dependent with the same values noted for all functional transfers to all surfaces. The pt is Dep for toileting skills secondary to the same precipitant. It is my recommendation that the pt receives 3 wks of OT service to improve upon his functional Ind with the aid of AE for LB task performance, core strengthening, and modificaitions in his technique to improve his functional outcome to ModI for safe return to home with intermittent care.  Home health service to be determined based on the pt's outcome prior to d/c.      ADL ADL Eating: Set up Where Assessed-Eating: Bed level Grooming: Setup Where Assessed-Grooming: Edge of bed Upper Body Bathing: Minimal assistance Lower Body Bathing: Dependent (secondary to joint constriction associated with arthritis) Where Assessed-Lower Body Bathing: Bed level Upper Body Dressing: Minimal assistance (Secondary to challenges with AROM for BUE coordination) Where Assessed-Upper Body Dressing: Bed level Lower Body Dressing: Dependent Where Assessed-Lower Body Dressing: Bed level Toileting: Dependent Where Assessed-Toileting: Bed level Toilet Transfer: Dependent (challenges with weight bearing through BLE, primarily his knees) Toilet Transfer Method: Other (comment) (Requires the assistance of device) Toilet Transfer Equipment: Other (comment) (didn't occur based on observation of performance with BLE pt presented as a safety risk.) Tub/Shower Transfer: Dependent Tub/Shower Transfer Method:  (didn't occur based on the pt's performance with BLE) Tub/Shower Equipment: Other  (comment) (didn't occur secondary to safety concerns with limited mobility of BLE) Psychologist, counselling Transfer: Dependent Film/video editor Method: Fish farm manager (based on limitation with BLE) Astronomer: Other (comment) (  shower didn't occur, however, based on limited mobility with BLE the pt presents a Dep at this time and would require the Maxi for safe transfer) Mobility  Bed Mobility Bed Mobility: Not assessed (supine to sit edge of bed didn't occur) Supine to Sit: Maximal Assistance - Patient - Patient 25-49% (based on chart review and the level of mobility the patient presented with at the time of the evaluation.) Sit to Supine: Maximal Assistance - Patient 25-49% (based on chart review)   Discharge Criteria: Patient will be discharged from OT if patient refuses treatment 3 consecutive times without medical reason, if treatment goals not met, if there is a change in medical status, if patient makes no progress towards goals or if patient is discharged from hospital.  The above assessment, treatment plan, treatment alternatives and goals were discussed and mutually agreed upon: by patient and by family  Lavona Mound 07/11/2023, 6:51 PM

## 2023-07-11 NOTE — Discharge Instructions (Addendum)
 Inpatient Rehab Discharge Instructions  Calvin Schultz Discharge date and time: No discharge date for patient encounter.   Activities/Precautions/ Functional Status: Activity: As tolerated Diet: regular Wound Care: Routine skin checks Functional status:  ___ No restrictions     ___ Walk up steps independently ___ 24/7 supervision/assistance   ___ Walk up steps with assistance ___ Intermittent supervision/assistance  ___ Bathe/dress independently ___ Walk with walker     __x_ Bathe/dress with assistance ___ Walk Independently    ___ Shower independently ___ Walk with assistance    ___ Shower with assistance ___ No alcohol     ___ Return to work/school ________  Special Instructions: No driving smoking or alcohol  Hold Eliquis until further notice until hematuria resolved  COMMUNITY REFERRALS UPON DISCHARGE:    Home Health:   PT   OT     RN                Agency:[facility]    Phone:[phone]     Medical Equipment/Items Ordered:HOSPITAL BED, SLIDE BOARD AND POWER CHAIR                                                 Agency/Supplier:[facility]   [phone]  [employer] 336-  [facility] RESOURCE GIVEN TO WIFE IN CASE NEEDED FOR FOLLOW UP APPOINTMENTS   My questions have been answered and I understand these instructions. I will adhere to these goals and the provided educational materials after my discharge from the hospital.  Patient/Caregiver Signature _______________________________ Date __________  Clinician Signature _______________________________________ Date __________  Please bring this form and your medication list with you to all your follow-up doctor's appointments.   Ureteral Stent Implantation, Care After -be sure to follow-up with Dr. Derrick Fling for stent/stone removal The following information offers guidance on how to care for yourself after your procedure. Your health care provider may also give you more specific instructions. If you have  problems or questions, contact your health care provider. What can I expect after the procedure? After the procedure, it is common to have: Nausea. Mild pain when you urinate. You may feel this pain in your lower back or lower abdomen. The pain should stop within a few minutes after you urinate. This pattern may last for up to 1 week. A small amount of blood in your urine for several days. Follow these instructions at home: Medicines Take over-the-counter and prescription medicines only as told by your health care provider. If you were prescribed antibiotics, take them as told by your health care provider. Do not stop using the antibiotic even if you start to feel better. If you were given a sedative during the procedure, it can affect you for several hours. Do not drive or operate machinery until your health care provider says that it is safe. Ask your health care provider if the medicine prescribed to you: Requires you to avoid driving or using machinery. Can cause constipation. You may need to take these actions to prevent or treat constipation: Take over-the-counter or prescription medicines. Eat foods that are high in fiber, such as beans, whole grains, and fresh fruits and vegetables. Limit foods that are high in fat and processed sugars, such as fried or sweet foods. Activity Rest as told by your health care provider. Do not sit for a long time without moving. Get up to  take short walks every 1-2 hours. This will improve blood flow and breathing. Ask for help if you feel weak or unsteady. Return to your normal activities as told by your health care provider. Ask your health care provider what activities are safe for you. General instructions  If you have a catheter: Follow instructions from your health care provider about taking care of your catheter and collection bag. Do not take baths, swim, or use a hot tub until your health care provider approves. Ask your health care provider if  you may take showers. You may only be allowed to take sponge baths. Drink enough fluid to keep your urine pale yellow. Do not use any products that contain nicotine or tobacco. These products include cigarettes, chewing tobacco, and vaping devices, such as e-cigarettes. These can delay healing after surgery. If you need help quitting, ask your health care provider. Keep all follow-up visits. Contact a health care provider if: You start passing blood clots, or you have more than a small amount of blood in your urine. You have pain that gets worse or does not get better with medicine, especially pain when you urinate. You have trouble urinating. You feel nauseous or you vomit again and again during a period of more than 2 days after the procedure. You have a fever. Get help right away if: You are passing blood clots that are 1 inch (2.5 cm) or larger in size. You are leaking urine (have incontinence), or you cannot urinate. The end of the stent comes out of your urethra. You have sudden, sharp, or severe pain in your abdomen or lower back. You have swelling or pain in your legs. You have trouble breathing. These symptoms may be an emergency. Get help right away. Call 911. Do not wait to see if the symptoms will go away. Do not drive yourself to the hospital. Summary After the procedure, it is common to have mild pain when you urinate that goes away within a few minutes after you urinate. This may last for up to 1 week. Take over-the-counter and prescription medicines only as told by your health care provider. Drink enough fluid to keep your urine pale yellow. Call your health care provider if you start passing blood clots, or you have more than a small amount of blood in your urine.

## 2023-07-11 NOTE — Progress Notes (Signed)
 PROGRESS NOTE   Subjective/Complaints: Pt reports knee hurts as well as buttocks from MASD- raw skin.  Only eating peaches for breakfast- doesn't like the hospital food.  Wife to bring in food.  LBM overnight- is improving.  Knee pain - controlled usually with pain meds.   Also per nursing, had fall this afternoon- ended up sitting on foot plate- no pain- leaned forward to grab something off tray and fell.      Objective:   DG Knee 1-2 Views Left Result Date: 07/11/2023 CLINICAL DATA:  Chronic knee pain. EXAM: LEFT KNEE - 1-2 VIEW COMPARISON:  None Available. FINDINGS: No fracture or dislocation. Marked loss of joint space noted in both the medial and lateral compartments. Advanced hypertrophic spurring in all 3 compartments. Small to moderate joint effusion. IMPRESSION: Advanced tricompartmental degenerative changes with small to moderate joint effusion. Electronically Signed   By: Kennith Center M.D.   On: 07/11/2023 08:28   Recent Labs    07/09/23 0513 07/11/23 0627  WBC 21.6* 16.4*  HGB 7.7* 7.1*  HCT 25.7* 23.6*  PLT 402* 385   Recent Labs    07/10/23 0600 07/11/23 0627  NA 131* 133*  K 4.4 4.6  CL 103 103  CO2 21* 21*  GLUCOSE 109* 146*  BUN 52* 54*  CREATININE 5.04* 5.08*  CALCIUM 7.9* 8.1*    Intake/Output Summary (Last 24 hours) at 07/11/2023 1605 Last data filed at 07/11/2023 0600 Gross per 24 hour  Intake --  Output 575 ml  Net -575 ml     Pressure Injury 07/10/23 Buttocks Right;Mid;Upper Stage 2 -  Partial thickness loss of dermis presenting as a shallow open injury with a red, pink wound bed without slough. Pink opened stage 2 right upper buttock (Active)  07/10/23 1608  Location: Buttocks  Location Orientation: Right;Mid;Upper  Staging: Stage 2 -  Partial thickness loss of dermis presenting as a shallow open injury with a red, pink wound bed without slough.  Wound Description (Comments): Pink  opened stage 2 right upper buttock  Present on Admission: Yes     Pressure Injury 07/10/23 Buttocks Right;Mid;Upper Stage 2 -  Partial thickness loss of dermis presenting as a shallow open injury with a red, pink wound bed without slough. Pink stage 2 directly beside the right upper stage 2 (Active)  07/10/23 1609  Location: Buttocks  Location Orientation: Right;Mid;Upper  Staging: Stage 2 -  Partial thickness loss of dermis presenting as a shallow open injury with a red, pink wound bed without slough.  Wound Description (Comments): Pink stage 2 directly beside the right upper stage 2  Present on Admission: Yes     Pressure Injury 07/10/23 Buttocks Right;Mid;Lower Stage 2 -  Partial thickness loss of dermis presenting as a shallow open injury with a red, pink wound bed without slough. Pink stage 2 to right buttock below the upper stage 2 (Active)  07/10/23 1610  Location: Buttocks  Location Orientation: Right;Mid;Lower  Staging: Stage 2 -  Partial thickness loss of dermis presenting as a shallow open injury with a red, pink wound bed without slough.  Wound Description (Comments): Pink stage 2 to right buttock below the upper stage  2  Present on Admission: Yes     Pressure Injury 07/10/23 Buttocks Left;Mid;Lower Stage 2 -  Partial thickness loss of dermis presenting as a shallow open injury with a red, pink wound bed without slough. Pink stage 2 to left buttock (Active)  07/10/23 1611  Location: Buttocks  Location Orientation: Left;Mid;Lower  Staging: Stage 2 -  Partial thickness loss of dermis presenting as a shallow open injury with a red, pink wound bed without slough.  Wound Description (Comments): Pink stage 2 to left buttock  Present on Admission: Yes    Physical Exam: Vital Signs Blood pressure 110/69, pulse 63, temperature (!) 94.2 F (34.6 C), resp. rate 16, height 5\' 9"  (1.753 m), weight 105.8 kg, SpO2 100%.   General: awake, alert, appropriate, HOH; on enteric precautions  from C Diff- c/o pain on buttocks and Knee; NAD HENT: conjugate gaze; oropharynx moist CV: regular rate and rhythm; no JVD Pulmonary: CTA B/L; no W/R/R- good air movement GI: soft, NT, ND, (+)BS- slightly hyperactive- only ate peaches for breakfast Psychiatric: appropriate Neurological: HOH- alert Skin: C/D/I. No apparent lesions.  Tube sites appear clean/dry/intact.  Mepilex on left shoulder. -Buttocks MASD    MSK:      Bilateral knees L>>R tender to palpation, greater on the medial aspect, with some surrounding warmth.  No palpable effusion.  No erythema.  No range of motion deficit.       Neurologic exam:  Cognition: AAO to person, place, time and event.  Mood: Pleasant affect, appropriate mood.  Sensation: To light touch intact in BL UEs and LEs  Reflexes: 2+ in BL UE and LEs. Negative Hoffman's and babinski signs bilaterally.  CN: 2-12 grossly intact.       Strength:                RUE: 5/5 SA, 5/5 EF, 5/5 EE, 5/5 WE, 5/5 FF, 5/5 FA                LUE:  5/5 SA, 5/5 EF, 5/5 EE, 5/5 WE, 5/5 FF, 5/5 FA                RLE: 4/5 HF, 4/5 KE, 5/5  DF, 5/5  EHL, 5/5  PF                 LLE:  3/5 HF, 4-/5 KE, 5/5  DF, 5/5  EHL, 5/5  PF       Assessment/Plan: 1. Functional deficits which require 3+ hours per day of interdisciplinary therapy in a comprehensive inpatient rehab setting. Physiatrist is providing close team supervision and 24 hour management of active medical problems listed below. Physiatrist and rehab team continue to assess barriers to discharge/monitor patient progress toward functional and medical goals  Care Tool:  Bathing              Bathing assist       Upper Body Dressing/Undressing Upper body dressing        Upper body assist      Lower Body Dressing/Undressing Lower body dressing            Lower body assist       Toileting Toileting    Toileting assist       Transfers Chair/bed transfer  Transfers assist     Chair/bed  transfer assist level: Total Assistance - Patient < 25% (slideboard)     Locomotion Ambulation   Ambulation assist   Ambulation activity did not occur: Safety/medical concerns  Walk 10 feet activity   Assist  Walk 10 feet activity did not occur: Safety/medical concerns        Walk 50 feet activity   Assist Walk 50 feet with 2 turns activity did not occur: Safety/medical concerns         Walk 150 feet activity   Assist Walk 150 feet activity did not occur: Safety/medical concerns         Walk 10 feet on uneven surface  activity   Assist Walk 10 feet on uneven surfaces activity did not occur: Safety/medical concerns         Wheelchair     Assist Is the patient using a wheelchair?: Yes Type of Wheelchair: Manual    Wheelchair assist level: Dependent - Patient 0% Max wheelchair distance: 150'    Wheelchair 50 feet with 2 turns activity    Assist        Assist Level: Dependent - Patient 0%   Wheelchair 150 feet activity     Assist      Assist Level: Dependent - Patient 0%   Blood pressure 110/69, pulse 63, temperature (!) 94.2 F (34.6 C), resp. rate 16, height 5\' 9"  (1.753 m), weight 105.8 kg, SpO2 100%.   Medical Problem List and Plan: 1. Functional deficits secondary to debility related to septic shock/C. difficile colitis/AKI superimposed on CKD stage IV             -patient may shower with tubes/drains covered             -ELOS/Goals: 18 to 21 days, min assist PT/OT             First day of evaluations- con't CIR PT and OT  -will transfuse 1 unit pRBCS today  Pt had fall today- leaned forward to grab something off tray and fell onto foot plate of w/c- has no pain from fall  2.  Antithrombotics: -DVT/anticoagulation:  Pharmaceutical: Eliquis             -antiplatelet therapy: N/A   3. Pain Management: Oxycodone as needed, Zanaflex 4 mg twice daily as needed   3/8- said knee hurting as well as buttocks- but  is controlled with current meds 4. Mood/Behavior/Sleep: Trazodone 50 mg nightly as needed.  Provide emotional support             -antipsychotic agents: N/A   5. Neuropsych/cognition: This patient is capable of making decisions on his own behalf.   6. Skin/Wound Care: Routine skin checks   7. Fluids/Electrolytes/Nutrition: Routine in and outs with follow-up chemistries   8.  Oliguric AKI superimposed on CKD stage IV.  Baseline creatinine 4.9.  CRRT discontinued 2/25.  No current plan for long-term dialysis.  Follow-up renal services              - Per discussion with nephrology Dr. Verna Czech, keep Foley catheter in to allow accurate I's and O's through Monday; then, can remove and do DC Foley trial while continuing to document strict outputs             - Continue daily assessments of renal function per nephrology   3/8- per renal, Cr leveling out- and con't Foley- will remove MOnday 9.  C. difficile colitis.  Completing course of oral vancomycin 3/7.  DC'd IV Flagyl 3/3. Enteric precautions    10.  Status post left nephrostomy tube d/t staghorn calculi.  Nephrostomy tube placed 1/29 per IR with plan for ureteric stent placement 3/21              -  Careful monitoring of nephrostomy output   11.  Chronic atrial fibrillation with episodes of bradycardia.  Followed by cardiology services.  Continue Eliquis.  Avoid AV nodal blocking agents   12.  Diabetes mellitus.  Hemoglobin A1c 6.2.  SSI  3/8- Cbgs controlled overall- con't regimen for now      Recent Labs    07/10/23 0801 07/10/23 1154 07/10/23 1635  GLUCAP 111* 165* 155*     13.  Hypotension.  ProAmatine 10 mg 3 times daily.  Monitor with increased mobility     07/10/2023    7:44 PM 07/10/2023    3:25 PM 07/10/2023    8:31 AM  Vitals with BMI  Height   5\' 9"     Weight   237 lbs 11 oz    BMI   35.09    Systolic 120 108 161  Diastolic 72 61 77  Pulse 83 64 66               - Normotensive on admission 14.  Chronic anemia.   Niferex daily/Aranesp.  Monitor for any bleeding episodes   3/8- Hb 7.1 today- will order transfusion- transfusion of 1 unit pRBCs.   15.  Bilateral knee pain, L>>R.  Baseline manages with Tylenol.              - Tylenol 650 every 4 hours as needed              - Voltaren gel to bilateral knees 4 times daily              - R knee xray 2019 showing tricompartmental arthritis; ordered L knee xray for AM  3/8- Xray shows severe tricompartment DJD- with small to moderate effusion- if pain doesn't improve, can call ortho to tap    I spent a total of 54   minutes on total care today- >50% coordination of care- due to  Review of chart, renal's note, H&P; labs, showing Hb 7.1 and elevated Cr/BUN- knee xray- already independently- d/w nursing about blood needed   LOS: 1 days A FACE TO FACE EVALUATION WAS PERFORMED  Diem Pagnotta 07/11/2023, 4:05 PM

## 2023-07-11 NOTE — Evaluation (Signed)
 Physical Therapy Assessment and Plan  Patient Details  Name: Calvin Schultz MRN: [id number] Date of Birth: 1950/07/12  PT Diagnosis: Difficulty walking and Muscle weakness Rehab Potential: Fair ELOS: ~3 weeks   Today's Date: 07/11/2023 PT Individual Time: 0801-0915 PT Individual Time Calculation (min): 74 min    Hospital Problem: Principal Problem:   Debility   Past Medical History:  Past Medical History:  Diagnosis Date   Arthritis    CKD (chronic kidney disease) stage 3, GFR 30-59 ml/min (HCC)    Diabetes mellitus without complication (HCC)    Hyperlipidemia    Hypertension    Past Surgical History:  Past Surgical History:  Procedure Laterality Date   IR NEPHROSTOMY PLACEMENT LEFT  06/03/2023   KIDNEY SURGERY      Assessment & Plan Clinical Impression: Patient is a 73 year old right-handed male with a history of CKD stage III with baseline creatinine 4.9, diabetes mellitus, hypertension, hyperlipidemia, atrial fibrillation with Eliquis. Patient with recent admission 05/30/2023 - 06/05/2023 for acute kidney injury superimposed on CKD with findings of left side hydronephrosis secondary to several calculi in the left renal collecting system with possible staghorn calculus concerning for left UPJ stricture. A nephrostomy tube was placed on the left and patient was discharged with nephrostomy tube that was placed 1/29 as well as ongoing antibiotics transition from ceftriaxone to Bactrim. Patient was also treated for lower extremity edema during that admission with aggressive IV Lasix diuresis. Per chart review patient lives with spouse. Two-level home bed and bath main level with a ramped entrance. Independent prior to admission. Presented 06/26/2023 to [facility] with progressive weakness as well as nausea and vomiting with diarrhea and left lower quadrant pain. Upon arrival to the ED he was noted to be hypotensive received IV fluid resuscitation. Initially blood pressure did  respond to IV fluids however he was ultimately required to be started on norepinephrine infusion. CT scan of the abdomen and pelvis completed concerning for colitis of the descending and sigmoid colon. C. difficile antigen and toxin positive. He was started on appropriate antibiotics. He also required a rectal tube that was removed 3/1. Admission chemistries were unremarkable except WBC 29,500, hemoglobin 8.9, sodium 129, BUN 96, creatinine 10.80, hemoglobin A1c 6.2, blood cultures no growth to date. He was started on CRRT on 2/22 followed by renal services discontinued 2/25. Hospital course complicated by bouts of bradycardia 2/26 and was transferred to [facility] for further evaluation. Currently his CRRT has been stopped no current plan for long-term hemodialysis with latest creatinine 4.93. Nephrostomy tube remains in place per IR with plan for ureteric stent placement 3/21. He remains on chronic Eliquis for atrial fibrillation monitoring for episodes of bradycardia with no further workup currently indicated and latest echocardiogram showing ejection fraction of 60 to 65% no wall motion abnormalities. Patient transferred to CIR on 07/10/2023 .   Patient currently requires total with mobility secondary to muscle weakness, decreased cardiorespiratoy endurance, and decreased sitting balance, decreased standing balance, decreased postural control, and decreased balance strategies.  Prior to hospitalization, patient was modified independent  with mobility and lived with Spouse in a House home.  Home access is  Ramped entrance.  Patient will benefit from skilled PT intervention to maximize safe functional mobility, minimize fall risk, and decrease caregiver burden for planned discharge home with 24 hour assist.  Anticipate patient will benefit from follow up [facility] at discharge.  PT - End of Session Activity Tolerance: Tolerates 30+ min activity with multiple rests Endurance  Deficit: Yes Endurance Deficit  Description: Frequent rest breaks PT Assessment Rehab Potential (ACUTE/IP ONLY): Fair PT Barriers to Discharge: Other (comments);Incontinence PT Barriers to Discharge Comments: severe knee pain limiting mobility PT Patient demonstrates impairments in the following area(s): Balance;Behavior;Endurance;Motor;Pain;Safety;Sensory PT Transfers Functional Problem(s): Bed Mobility;Bed to Chair;Car;Furniture PT Locomotion Functional Problem(s): Ambulation;Wheelchair Mobility;Stairs PT Plan PT Intensity: Minimum of 1-2 x/day ,45 to 90 minutes PT Frequency: 5 out of 7 days PT Duration Estimated Length of Stay: ~3 weeks PT Treatment/Interventions: Ambulation/gait training;Community reintegration;DME/adaptive equipment instruction;Neuromuscular re-education;Psychosocial support;Stair training;UE/LE Strength taining/ROM;Wheelchair propulsion/positioning;Balance/vestibular training;Discharge planning;Functional electrical stimulation;Pain management;Skin care/wound management;Therapeutic Activities;UE/LE Coordination activities;Cognitive remediation/compensation;Disease management/prevention;Functional mobility training;Patient/family education;Splinting/orthotics;Therapeutic Exercise;Visual/perceptual remediation/compensation PT Transfers Anticipated Outcome(s): MinA PT Locomotion Anticipated Outcome(s): MinA household distances PT Recommendation Recommendations for Other Services: Therapeutic Recreation consult Therapeutic Recreation Interventions: Stress management Follow Up Recommendations: Home health PT;24 hour supervision/assistance Patient destination: Home Equipment Recommended: To be determined   PT Evaluation Precautions/Restrictions   General   Vital Signs  Pain Pain Assessment Pain Scale: 0-10 Pain Score: 10-Worst pain ever Pain Location: Knee Pain Intervention(s): Medication (See eMAR) Pain Interference Pain Interference Pain Effect on Sleep: 3. Frequently Pain Interference with  Therapy Activities: 3. Frequently Pain Interference with Day-to-Day Activities: 3. Frequently Home Living/Prior Functioning Home Living Available Help at Discharge: Family;Available PRN/intermittently Type of Home: House Home Access: Ramped entrance Home Layout: Two level;Able to live on main level with bedroom/bathroom Bathroom Shower/Tub: Health visitor: Standard Bathroom Accessibility: Yes  Lives With: Spouse Prior Function Level of Independence: Requires assistive device for independence (Has been using SPC for past 6 months) Driving: Yes Vision/Perception  Vision - History Ability to See in Adequate Light: 0 Adequate Perception Perception: Within Functional Limits Praxis Praxis: WFL  Cognition Overall Cognitive Status: Within Functional Limits for tasks assessed Arousal/Alertness: Awake/alert Orientation Level: Oriented X4 Safety/Judgment: Appears intact Sensation Sensation Light Touch: Appears Intact Coordination Gross Motor Movements are Fluid and Coordinated: No Fine Motor Movements are Fluid and Coordinated: No Coordination and Movement Description: Generalized weakness, debility, and bilateral knee pain limiting movement Motor  Motor Motor: Within Functional Limits  Trunk/Postural Assessment  Cervical Assessment Cervical Assessment: Exceptions to [facility] (forward head) Thoracic Assessment Thoracic Assessment: Exceptions to [facility] (rounded shoulders) Lumbar Assessment Lumbar Assessment: Exceptions to [facility] (posterior pelvic tilt) Postural Control Postural Control: Deficits on evaluation (delayed and inadequate)  Balance Balance Balance Assessed: Yes Static Sitting Balance Static Sitting - Balance Support: Feet supported;Bilateral upper extremity supported Static Sitting - Level of Assistance: 5: Stand by assistance Dynamic Sitting Balance Dynamic Sitting - Balance Support: Feet supported;Bilateral upper extremity supported Dynamic Sitting - Level  of Assistance: 3: Mod assist Extremity Assessment  RLE Assessment RLE Assessment: Exceptions to [facility] General Strength Comments: Grossly 3+/5, very limited by knee pain LLE Assessment LLE Assessment: Exceptions to [facility] General Strength Comments: Grossly 3/5, very limited by knee pain  Care Tool Care Tool Bed Mobility Roll left and right activity   Roll left and right assist level: Moderate Assistance - Patient 50 - 74%    Sit to lying activity   Sit to lying assist level: Maximal Assistance - Patient 25 - 49%    Lying to sitting on side of bed activity   Lying to sitting on side of bed assist level: the ability to move from lying on the back to sitting on the side of the bed with no back support.: Maximal Assistance - Patient 25 - 49%     Care Tool Transfers Sit to stand transfer Sit  to stand activity did not occur: Safety/medical concerns (weakness and knee pain)      Chair/bed transfer   Chair/bed transfer assist level: Total Assistance - Patient < 25% (slideboard)    Licensed conveyancer transfer activity did not occur: Safety/medical concerns        Care Tool Locomotion Ambulation Ambulation activity did not occur: Safety/medical concerns        Walk 10 feet activity Walk 10 feet activity did not occur: Safety/medical concerns       Walk 50 feet with 2 turns activity Walk 50 feet with 2 turns activity did not occur: Safety/medical concerns      Walk 150 feet activity Walk 150 feet activity did not occur: Safety/medical concerns      Walk 10 feet on uneven surfaces activity Walk 10 feet on uneven surfaces activity did not occur: Safety/medical concerns      Stairs Stair activity did not occur: Safety/medical concerns        Walk up/down 1 step activity Walk up/down 1 step or curb (drop down) activity did not occur: Safety/medical concerns      Walk up/down 4 steps activity Walk up/down 4 steps activity did not occur: Safety/medical concerns      Walk up/down 12  steps activity Walk up/down 12 steps activity did not occur: Safety/medical concerns      Pick up small objects from floor Pick up small object from the floor (from standing position) activity did not occur: Safety/medical concerns      Wheelchair Is the patient using a wheelchair?: Yes Type of Wheelchair: Manual   Wheelchair assist level: Dependent - Patient 0% Max wheelchair distance: 150'  Wheel 50 feet with 2 turns activity   Assist Level: Dependent - Patient 0%  Wheel 150 feet activity   Assist Level: Dependent - Patient 0%    Refer to Care Plan for Long Term Goals  SHORT TERM GOAL WEEK 1 PT Short Term Goal 1 (Week 1): Pt will complete bed mobility with modA consistently. PT Short Term Goal 2 (Week 1): Pt will complete sit to stand with maxA. PT Short Term Goal 3 (Week 1): Pt will complete bed to chair with maxA consistently. PT Short Term Goal 4 (Week 1): Pt will initiate gait training.  Recommendations for other services: Therapeutic Recreation  Stress management  Skilled Therapeutic Intervention  Evaluation completed (see details above and below) with education on PT POC and goals and individual treatment initiated with focus on bed mobility, balance, transfers, and activity tolerance. Pt received supine in bed and agrees to therapy. Reports pain in both knees, Lt>Rt. PT provides rest breaks and elevation to manage pain. Pt performs supine to sit slowly with bed features and cues for sequencing, requiring maA at trunk to facilitate sdielying to sit, with manual assistance initially required due to posterior bias, wthich patient is able to improve with cueing and increased time at EOB. Pt provided with increased time seated at EOB to acclimate to upright positioning. Pt performs sitting to Rt sideleaning on Rt elbow with minA/modA for transition. PT places slideboard under hips. PT provides education on head/hips relationship and demonstrates desired movement. Pt then performs  slideboard trnasfer from elevated bed to [facility] with totalA to facilitate anterior weight shift, trunk lean, and sequencing of transfer. Pt is very limited in mobility by knee pain, and is hesitant to bear weigh through legs. WC transport to gym for time management. Pt completes kinetron for strengthening of bilateral hip extensors  with minimal pain provocation through knees. Pt completes at resistance of 80 cm/sec with PT providing cues for body mechanics and completing full available ROM. PT provides pt with elevating leg rests to allow pt to stay out of bed to work on activity tolerance and endurance, with legs elevated and propped on pillow over slideboard positioned on leg rests. Pt left seated with alarm intact and all needs within reach.  Mobility Bed Mobility Bed Mobility: Supine to Sit;Sit to Supine Supine to Sit: Maximal Assistance - Patient - Patient 25-49% Sit to Supine: Maximal Assistance - Patient 25-49% Transfers Transfers: Lateral/Scoot Transfers Lateral/Scoot Transfers: Total Assistance - Patient < 25% Transfer (Assistive device):  (slideboard) Locomotion  Gait Ambulation: No Gait Gait: No Stairs / Additional Locomotion Stairs: No Wheelchair Mobility Wheelchair Mobility: Yes Wheelchair Assistance: Dependent - Patient 0% Wheelchair Propulsion: Both upper extremities Wheelchair Parts Management: Needs assistance Distance: 150'   Discharge Criteria: Patient will be discharged from PT if patient refuses treatment 3 consecutive times without medical reason, if treatment goals not met, if there is a change in medical status, if patient makes no progress towards goals or if patient is discharged from hospital.  The above assessment, treatment plan, treatment alternatives and goals were discussed and mutually agreed upon: by patient  Beau Fanny, PT, DPT 07/11/2023, 10:27 AM

## 2023-07-11 NOTE — Progress Notes (Signed)
  [facility] Progress Note  Subjective:  Now in CIR Creatinine about the same, UOP about 0.8 L No c/o this AM  Vitals:   07/10/23 1525 07/10/23 1944 07/11/23 0422 07/11/23 0600  BP: 108/61 120/72 (!) 109/54   Pulse: 64 83 64   Resp: 17 18 16    Temp: 98.2 F (36.8 C) 98.3 F (36.8 C) 97.8 F (36.6 C)   TempSrc: Oral Oral    SpO2: 99% 100% 100%   Weight: 107.8 kg   105.8 kg  Height: 5\' 9"  (1.753 m)       Physical Exam:     NAD, weak and ill appearing HEENT NCAT Lungs clear to auscultation bilaterally normal work of breathing at rest on room air  Heart S1S2 no rub Abdomen soft nontender nondistended Extremities  trace edema lower extremities  Psych normal mood and affect Neuro alert and oriented x 3 provides hx and follows commands     Renal-related home meds: - norvasc 5 - olmesartan 20 every day - fosrenol 750 mg ac tid - renvela 800 ac tid - others: insulin, eliquis       Date                             Creat               eGFR (ml/min) 04/18/2021                  1.87                 39 06/25/21                        2.00                 36 ml/min 07/2021                         1.73                 42 09/2021                         2.17                 32 12/2021                         2.51                 27 01/2022                         2.48                 28 ml/min, CKD 4 05/30/23- 06/05/23 4.43- 5.34 11- 14 ml/min 06/26/23                        10.8                 5 ml/min   UA - prot > 300, mod Hb, large LE, 11-20 rbcs > 50 wbc, 0-5 epi UNa 114, UCr 20 CT renal stone 2/21 - Adrenals/Urinary Tract: Adrenal glands appear normal. Bilateral nephrolithiasis is noted. Percutaneous left nephrostomy is noted. No hydronephrosis or renal obstruction is noted. Urinary bladder is unremarkable.  CXR 2/21 - IMPRESSION: 1. No acute intrathoracic process.       Assessment/ Plan: Oliguric AKI on CKD 4 - AKI is ischemic and pre-renal insults related  to hypotension +/- ARB and intravasc vol depletion.  CT w/o obstruction, has L neph tube.  He was on CRRT from 2/22 - 2/25.  Note that Cr 4.06- 5.3 from jan 2025, eGFR 11-15 ml/min.  For reference in 01/2022 with Cr 2.48 from Care Everywhere Cont holding HD and watching labs and UOP; creatinine is leveling out No vascular access currenlty, TDC if needed  Continue foley catheter and PCN Continuing to monitor UOP will be very helpful here Metabolic acidosis -resolved/stable Shock/ hypotension - resolved L nephrostomy tube - placed in Jan 2025 by IR, and is f/b urology. Has staghorn calculi on the L and had previously been scheduled to have stone removed and stent placed per his wife on March 7th at [facility]. Postponed Urinary retention - continue foley catheter Chronic atrial fib - on eliquis; per primary team  Hypophosphatemia secondary to CRRT - resolved Hypokalemia -  resolved Anemia normocytic: cont ESA; PRBC's per primary team    Recent Labs  Lab 07/09/23 0513 07/10/23 0600 07/11/23 0627  HGB 7.7*  --  7.1*  ALBUMIN 1.9* 1.9* 1.7*  CALCIUM 7.6* 7.9* 8.1*  PHOS 5.1* 5.4*  --   CREATININE 4.98* 5.04* 5.08*  K 4.4 4.4 4.6   No results for input(s): "IRON", "TIBC", "FERRITIN" in the last 168 hours.  Inpatient medications:  apixaban  5 mg Oral BID   Chlorhexidine Gluconate Cloth  6 each Topical BID   darbepoetin (ARANESP) injection - NON-DIALYSIS  40 mcg Subcutaneous Q Fri-1800   diclofenac Sodium  2 g Topical QID   insulin aspart  0-9 Units Subcutaneous TID WC   iron polysaccharides  150 mg Oral Daily   midodrine  10 mg Oral TID WC   multivitamin  1 tablet Oral QHS     acetaminophen, oxyCODONE, simethicone, tiZANidine, traZODone   Arita Miss, MD 9:36 AM 07/11/2023

## 2023-07-12 DIAGNOSIS — R5381 Other malaise: Secondary | ICD-10-CM | POA: Diagnosis not present

## 2023-07-12 LAB — TYPE AND SCREEN
ABO/RH(D): O POS
Antibody Screen: NEGATIVE
Unit division: 0

## 2023-07-12 LAB — BASIC METABOLIC PANEL
Anion gap: 11 (ref 5–15)
BUN: 57 mg/dL — ABNORMAL HIGH (ref 8–23)
CO2: 16 mmol/L — ABNORMAL LOW (ref 22–32)
Calcium: 8.1 mg/dL — ABNORMAL LOW (ref 8.9–10.3)
Chloride: 103 mmol/L (ref 98–111)
Creatinine, Ser: 5.1 mg/dL — ABNORMAL HIGH (ref 0.61–1.24)
GFR, Estimated: 11 mL/min — ABNORMAL LOW (ref >60)
Glucose, Bld: 120 mg/dL — ABNORMAL HIGH (ref 70–99)
Potassium: 4.8 mmol/L (ref 3.5–5.1)
Sodium: 130 mmol/L — ABNORMAL LOW (ref 135–145)

## 2023-07-12 LAB — BPAM RBC
Blood Product Expiration Date: 202504062359
ISSUE DATE / TIME: 202503082309
Unit Type and Rh: 5100

## 2023-07-12 LAB — GLUCOSE, CAPILLARY
Glucose-Capillary: 125 mg/dL — ABNORMAL HIGH (ref 70–99)
Glucose-Capillary: 145 mg/dL — ABNORMAL HIGH (ref 70–99)
Glucose-Capillary: 179 mg/dL — ABNORMAL HIGH (ref 70–99)
Glucose-Capillary: 160 mg/dL — ABNORMAL HIGH (ref 70–99)

## 2023-07-12 LAB — CBC
HCT: 28.3 % — ABNORMAL LOW (ref 39.0–52.0)
Hemoglobin: 8.8 g/dL — ABNORMAL LOW (ref 13.0–17.0)
MCH: 27.8 pg (ref 26.0–34.0)
MCHC: 31.1 g/dL (ref 30.0–36.0)
MCV: 89.3 fL (ref 80.0–100.0)
Platelets: 376 10*3/uL (ref 150–400)
RBC: 3.17 MIL/uL — ABNORMAL LOW (ref 4.22–5.81)
RDW: 16.4 % — ABNORMAL HIGH (ref 11.5–15.5)
WBC: 21.7 10*3/uL — ABNORMAL HIGH (ref 4.0–10.5)
nRBC: 0 % (ref 0.0–0.2)

## 2023-07-12 NOTE — Progress Notes (Signed)
 [facility] Progress Note  Subjective:  Decent UOP and SCr is stable No c/o this AM  Vitals:   07/11/23 2302 07/11/23 2326 07/12/23 0151 07/12/23 0513  BP: 109/66 114/67 125/79   Pulse: (!) 49 96 (!) 49   Resp: 17 16 16    Temp: (!) 97.5 F (36.4 C) (!) 97.5 F (36.4 C) 97.8 F (36.6 C)   TempSrc: Oral Axillary Axillary   SpO2: 98% 92% 100%   Weight:    105.7 kg  Height:        Physical Exam:     NAD, weak and ill appearing HEENT NCAT Lungs clear to auscultation bilaterally normal work of breathing at rest on room air  Heart S1S2 no rub Abdomen soft nontender nondistended Extremities  trace edema lower extremities  Psych normal mood and affect Neuro alert and oriented x 3 provides hx and follows commands     Renal-related home meds: - norvasc 5 - olmesartan 20 every day - fosrenol 750 mg ac tid - renvela 800 ac tid - others: insulin, eliquis       Date                             Creat               eGFR (ml/min) 04/18/2021                  1.87                 39 06/25/21                        2.00                 36 ml/min 07/2021                         1.73                 42 09/2021                         2.17                 32 12/2021                         2.51                 27 01/2022                         2.48                 28 ml/min, CKD 4 05/30/23- 06/05/23 4.43- 5.34 11- 14 ml/min 06/26/23                        10.8                 5 ml/min   UA - prot > 300, mod Hb, large LE, 11-20 rbcs > 50 wbc, 0-5 epi UNa 114, UCr 20 CT renal stone 2/21 - Adrenals/Urinary Tract: Adrenal glands appear normal. Bilateral nephrolithiasis is noted. Percutaneous left nephrostomy is noted. No hydronephrosis or renal obstruction is noted. Urinary bladder is unremarkable.  CXR 2/21 - IMPRESSION: 1. No  acute intrathoracic process.       Assessment/ Plan: Oliguric AKI on CKD 4 - AKI is ischemic and pre-renal insults related to hypotension +/- ARB and  intravasc vol depletion.  CT w/o obstruction, has L neph tube.  He was on CRRT from 2/22 - 2/25.  Note that Cr 4.06- 5.3 from jan 2025, eGFR 11-15 ml/min.  For reference in 01/2022 with Cr 2.48 from Care Everywhere Cont holding HD and watching labs and UOP; creatinine is leveling out No vascular access currenlty, TDC if needed  Continue foley catheter and PCN OK to remove foley if adequate UOP can be measured Metabolic acidosis -worsened today, CTM for now Shock/ hypotension - resolved L nephrostomy tube - placed in Jan 2025 by IR, and is f/b urology. Has staghorn calculi on the L and had previously been scheduled to have stone removed and stent placed per his wife on March 7th at [facility]. Postponed Urinary retention - as above, per CIR Chronic atrial fib - on eliquis; per primary team  Hypophosphatemia secondary to CRRT - resolved Hypokalemia -  resolved Anemia normocytic: cont ESA; PRBC's per primary team    Recent Labs  Lab 07/09/23 0513 07/10/23 0600 07/11/23 0627 07/12/23 0731  HGB 7.7*  --  7.1* 8.8*  ALBUMIN 1.9* 1.9* 1.7*  --   CALCIUM 7.6* 7.9* 8.1* 8.1*  PHOS 5.1* 5.4*  --   --   CREATININE 4.98* 5.04* 5.08* 5.10*  K 4.4 4.4 4.6 4.8   No results for input(s): "IRON", "TIBC", "FERRITIN" in the last 168 hours.  Inpatient medications:  apixaban  5 mg Oral BID   Chlorhexidine Gluconate Cloth  6 each Topical BID   darbepoetin (ARANESP) injection - NON-DIALYSIS  40 mcg Subcutaneous Q Fri-1800   diclofenac Sodium  2 g Topical QID   insulin aspart  0-9 Units Subcutaneous TID WC   iron polysaccharides  150 mg Oral Daily   midodrine  10 mg Oral TID WC   multivitamin  1 tablet Oral QHS     acetaminophen, oxyCODONE, simethicone, tiZANidine, traZODone   Arita Miss, MD 10:35 AM 07/12/2023

## 2023-07-12 NOTE — Progress Notes (Signed)
 PROGRESS NOTE   Subjective/Complaints: Pt reports just woke him up He got the blood yesterday- feels about the same.  No issues Per chart, is afebrile    ROS: Limited due to sleepiness   Objective:   DG Knee 1-2 Views Left Result Date: 07/11/2023 CLINICAL DATA:  Chronic knee pain. EXAM: LEFT KNEE - 1-2 VIEW COMPARISON:  None Available. FINDINGS: No fracture or dislocation. Marked loss of joint space noted in both the medial and lateral compartments. Advanced hypertrophic spurring in all 3 compartments. Small to moderate joint effusion. IMPRESSION: Advanced tricompartmental degenerative changes with small to moderate joint effusion. Electronically Signed   By: Kennith Center M.D.   On: 07/11/2023 08:28   Recent Labs    07/11/23 0627 07/12/23 0731  WBC 16.4* 21.7*  HGB 7.1* 8.8*  HCT 23.6* 28.3*  PLT 385 376   Recent Labs    07/11/23 0627 07/12/23 0731  NA 133* 130*  K 4.6 4.8  CL 103 103  CO2 21* 16*  GLUCOSE 146* 120*  BUN 54* 57*  CREATININE 5.08* 5.10*  CALCIUM 8.1* 8.1*    Intake/Output Summary (Last 24 hours) at 07/12/2023 1024 Last data filed at 07/12/2023 1000 Gross per 24 hour  Intake 1163.33 ml  Output 965 ml  Net 198.33 ml     Pressure Injury 07/10/23 Buttocks Right;Mid;Upper Stage 2 -  Partial thickness loss of dermis presenting as a shallow open injury with a red, pink wound bed without slough. Pink opened stage 2 right upper buttock (Active)  07/10/23 1608  Location: Buttocks  Location Orientation: Right;Mid;Upper  Staging: Stage 2 -  Partial thickness loss of dermis presenting as a shallow open injury with a red, pink wound bed without slough.  Wound Description (Comments): Pink opened stage 2 right upper buttock  Present on Admission: Yes     Pressure Injury 07/10/23 Buttocks Right;Mid;Upper Stage 2 -  Partial thickness loss of dermis presenting as a shallow open injury with a red, pink wound  bed without slough. Pink stage 2 directly beside the right upper stage 2 (Active)  07/10/23 1609  Location: Buttocks  Location Orientation: Right;Mid;Upper  Staging: Stage 2 -  Partial thickness loss of dermis presenting as a shallow open injury with a red, pink wound bed without slough.  Wound Description (Comments): Pink stage 2 directly beside the right upper stage 2  Present on Admission: Yes     Pressure Injury 07/10/23 Buttocks Right;Mid;Lower Stage 2 -  Partial thickness loss of dermis presenting as a shallow open injury with a red, pink wound bed without slough. Pink stage 2 to right buttock below the upper stage 2 (Active)  07/10/23 1610  Location: Buttocks  Location Orientation: Right;Mid;Lower  Staging: Stage 2 -  Partial thickness loss of dermis presenting as a shallow open injury with a red, pink wound bed without slough.  Wound Description (Comments): Pink stage 2 to right buttock below the upper stage 2  Present on Admission: Yes     Pressure Injury 07/10/23 Buttocks Left;Mid;Lower Stage 2 -  Partial thickness loss of dermis presenting as a shallow open injury with a red, pink wound bed without slough. Pink stage 2  to left buttock (Active)  07/10/23 1611  Location: Buttocks  Location Orientation: Left;Mid;Lower  Staging: Stage 2 -  Partial thickness loss of dermis presenting as a shallow open injury with a red, pink wound bed without slough.  Wound Description (Comments): Pink stage 2 to left buttock  Present on Admission: Yes    Physical Exam: Vital Signs Blood pressure 125/79, pulse (!) 49, temperature 97.8 F (36.6 C), temperature source Axillary, resp. rate 16, height 5\' 9"  (1.753 m), weight 105.7 kg, SpO2 100%.    General: awake, alert, appropriate, just woke up; NAD HENT: conjugate gaze; oropharynx moist CV: bradycardic rate-; no JVD Pulmonary: CTA B/L; no W/R/R- good air movement GI: soft, NT, ND, (+)BS- normoactive Psychiatric: appropriate-  sleepy Neurological: Ox3  Neurological: HOH- alert Skin: C/D/I. No apparent lesions.  Tube sites appear clean/dry/intact.  Mepilex on left shoulder. -Buttocks MASD    MSK:      Bilateral knees L>>R tender to palpation, greater on the medial aspect, with some surrounding warmth.  No palpable effusion.  No erythema.  No range of motion deficit.       Neurologic exam:  Cognition: AAO to person, place, time and event.  Mood: Pleasant affect, appropriate mood.  Sensation: To light touch intact in BL UEs and LEs  Reflexes: 2+ in BL UE and LEs. Negative Hoffman's and babinski signs bilaterally.  CN: 2-12 grossly intact.       Strength:                RUE: 5/5 SA, 5/5 EF, 5/5 EE, 5/5 WE, 5/5 FF, 5/5 FA                LUE:  5/5 SA, 5/5 EF, 5/5 EE, 5/5 WE, 5/5 FF, 5/5 FA                RLE: 4/5 HF, 4/5 KE, 5/5  DF, 5/5  EHL, 5/5  PF                 LLE:  3/5 HF, 4-/5 KE, 5/5  DF, 5/5  EHL, 5/5  PF       Assessment/Plan: 1. Functional deficits which require 3+ hours per day of interdisciplinary therapy in a comprehensive inpatient rehab setting. Physiatrist is providing close team supervision and 24 hour management of active medical problems listed below. Physiatrist and rehab team continue to assess barriers to discharge/monitor patient progress toward functional and medical goals  Care Tool:  Bathing  Bathing activity did not occur:  (patient completed a simulated task at bed LOF) Body parts bathed by patient: Right arm, Left arm, Chest, Abdomen, Front perineal area, Right upper leg, Left upper leg, Face   Body parts bathed by helper: Buttocks, Left lower leg, Right lower leg     Bathing assist Assist Level: Maximal Assistance - Patient 24 - 49%     Upper Body Dressing/Undressing Upper body dressing   What is the patient wearing?: Pull over shirt    Upper body assist Assist Level: Minimal Assistance - Patient > 75%    Lower Body Dressing/Undressing Lower body dressing       What is the patient wearing?: Pants, Underwear/pull up, Incontinence brief     Lower body assist Assist for lower body dressing: Maximal Assistance - Patient 25 - 49%     Toileting Toileting    Toileting assist Assist for toileting: Dependent - Patient 0%     Transfers Chair/bed transfer  Transfers assist  Chair/bed transfer assist level: Total Assistance - Patient < 25%     Locomotion Ambulation   Ambulation assist   Ambulation activity did not occur: Safety/medical concerns          Walk 10 feet activity   Assist  Walk 10 feet activity did not occur: Safety/medical concerns        Walk 50 feet activity   Assist Walk 50 feet with 2 turns activity did not occur: Safety/medical concerns         Walk 150 feet activity   Assist Walk 150 feet activity did not occur: Safety/medical concerns         Walk 10 feet on uneven surface  activity   Assist Walk 10 feet on uneven surfaces activity did not occur: Safety/medical concerns         Wheelchair     Assist Is the patient using a wheelchair?: Yes Type of Wheelchair: Manual    Wheelchair assist level: Dependent - Patient 0% Max wheelchair distance: 150'    Wheelchair 50 feet with 2 turns activity    Assist        Assist Level: Dependent - Patient 0%   Wheelchair 150 feet activity     Assist      Assist Level: Dependent - Patient 0%   Blood pressure 125/79, pulse (!) 49, temperature 97.8 F (36.6 C), temperature source Axillary, resp. rate 16, height 5\' 9"  (1.753 m), weight 105.7 kg, SpO2 100%.   Medical Problem List and Plan: 1. Functional deficits secondary to debility related to septic shock/C. difficile colitis/AKI superimposed on CKD stage IV             -patient may shower with tubes/drains covered             -ELOS/Goals: 18 to 21 days, min assist PT/OT             Con't CIR PT and OT  WBC 21.7- per ID recheck in Am- wait to start ABX due to recent C  diff  Pt had fall today- leaned forward to grab something off tray and fell onto foot plate of w/c- has no pain from fall  2.  Antithrombotics: -DVT/anticoagulation:  Pharmaceutical: Eliquis             -antiplatelet therapy: N/A   3. Pain Management: Oxycodone as needed, Zanaflex 4 mg twice daily as needed   3/8-3/9 said knee hurting as well as buttocks- but is controlled with current meds 4. Mood/Behavior/Sleep: Trazodone 50 mg nightly as needed.  Provide emotional support             -antipsychotic agents: N/A   5. Neuropsych/cognition: This patient is capable of making decisions on his own behalf.   6. Skin/Wound Care: Routine skin checks   7. Fluids/Electrolytes/Nutrition: Routine in and outs with follow-up chemistries   8.  Oliguric AKI superimposed on CKD stage IV.  Baseline creatinine 4.9.  CRRT discontinued 2/25.  No current plan for long-term dialysis.  Follow-up renal services              - Per discussion with nephrology Dr. Verna Czech, keep Foley catheter in to allow accurate I's and O's through Monday; then, can remove and do DC Foley trial while continuing to document strict outputs             - Continue daily assessments of renal function per nephrology   3/8- per renal, Cr leveling out- and con't Foley- will remove Monday  3/9- foley, per renal can ne removed Monday 9.  C. difficile colitis.  Completing course of oral vancomycin 3/7.  DC'd IV Flagyl 3/3. Enteric precautions   3/9- spoke with pahrmacy- pt has adequate tx for Cdiff and LBM yesterday- is improved 10.  Status post left nephrostomy tube d/t staghorn calculi. Has Foley Nephrostomy tube placed 1/29 per IR with plan for ureteric stent placement 3/21              - Careful monitoring of nephrostomy output 11.  Chronic atrial fibrillation with episodes of bradycardia.  Followed by cardiology services.  Continue Eliquis.  Avoid AV nodal blocking agents   12.  Diabetes mellitus.  Hemoglobin A1c 6.2.  SSI  3/8-3/9  Cbgs controlled overall- con't regimen for now      Recent Labs    07/10/23 0801 07/10/23 1154 07/10/23 1635  GLUCAP 111* 165* 155*     13.  Hypotension.  ProAmatine 10 mg 3 times daily.  Monitor with increased mobility   - Normotensive on admission  3/9- BP running 100-s to 120s systolic- con't regimen 14.  Chronic anemia.  Niferex daily/Aranesp.  Monitor for any bleeding episodes   3/8- Hb 7.1 today- will order transfusion- transfusion of 1 unit pRBCs.   3/9- Hb up to 8.8- con't to monitor trend 15.  Bilateral knee pain, L>>R.  Baseline manages with Tylenol.              - Tylenol 650 every 4 hours as needed              - Voltaren gel to bilateral knees 4 times daily              - R knee xray 2019 showing tricompartmental arthritis; ordered L knee xray for AM  3/8- Xray shows severe tricompartment DJD- with small to moderate effusion- if pain doesn't improve, can call ortho to tap  3/9- pt reports pain better this AM  16. Leukocytosis  3/9- Pt's WBC is 21.7- up from 16k- is afebrile- due to his recent C diff, that just finished correct dosing of ABX for, called ID for guidance on treatment- don't want to cause more Cdiff- she suggested rechecking in AM- so no other Sx's- and call ID back if worse.    I spent a total of 55   minutes on total care today- >50% coordination of care- due to  D/w pharmacy, nursing as well as ID- due to renal issues, as well as leukocytosis   LOS: 2 days A FACE TO FACE EVALUATION WAS PERFORMED  Jenniferlynn Saad 07/12/2023, 10:24 AM

## 2023-07-13 ENCOUNTER — Other Ambulatory Visit: Payer: Self-pay | Admitting: Urology

## 2023-07-13 DIAGNOSIS — D72823 Leukemoid reaction: Secondary | ICD-10-CM | POA: Diagnosis not present

## 2023-07-13 DIAGNOSIS — M170 Bilateral primary osteoarthritis of knee: Secondary | ICD-10-CM

## 2023-07-13 DIAGNOSIS — A0472 Enterocolitis due to Clostridium difficile, not specified as recurrent: Secondary | ICD-10-CM

## 2023-07-13 DIAGNOSIS — R5381 Other malaise: Secondary | ICD-10-CM | POA: Diagnosis not present

## 2023-07-13 DIAGNOSIS — N179 Acute kidney failure, unspecified: Secondary | ICD-10-CM

## 2023-07-13 DIAGNOSIS — N184 Chronic kidney disease, stage 4 (severe): Secondary | ICD-10-CM

## 2023-07-13 LAB — BASIC METABOLIC PANEL
Anion gap: 8 (ref 5–15)
BUN: 56 mg/dL — ABNORMAL HIGH (ref 8–23)
CO2: 19 mmol/L — ABNORMAL LOW (ref 22–32)
Calcium: 8 mg/dL — ABNORMAL LOW (ref 8.9–10.3)
Chloride: 104 mmol/L (ref 98–111)
Creatinine, Ser: 5.16 mg/dL — ABNORMAL HIGH (ref 0.61–1.24)
GFR, Estimated: 11 mL/min — ABNORMAL LOW (ref >60)
Glucose, Bld: 107 mg/dL — ABNORMAL HIGH (ref 70–99)
Potassium: 4.1 mmol/L (ref 3.5–5.1)
Sodium: 131 mmol/L — ABNORMAL LOW (ref 135–145)

## 2023-07-13 LAB — CBC WITH DIFFERENTIAL/PLATELET
Abs Immature Granulocytes: 0.16 10*3/uL — ABNORMAL HIGH (ref 0.00–0.07)
Basophils Absolute: 0.1 10*3/uL (ref 0.0–0.1)
Basophils Relative: 0 %
Eosinophils Absolute: 0.3 10*3/uL (ref 0.0–0.5)
Eosinophils Relative: 2 %
HCT: 26.6 % — ABNORMAL LOW (ref 39.0–52.0)
Hemoglobin: 8.2 g/dL — ABNORMAL LOW (ref 13.0–17.0)
Immature Granulocytes: 1 %
Lymphocytes Relative: 6 %
Lymphs Abs: 1 10*3/uL (ref 0.7–4.0)
MCH: 27.5 pg (ref 26.0–34.0)
MCHC: 30.8 g/dL (ref 30.0–36.0)
MCV: 89.3 fL (ref 80.0–100.0)
Monocytes Absolute: 1.5 10*3/uL — ABNORMAL HIGH (ref 0.1–1.0)
Monocytes Relative: 9 %
Neutro Abs: 14.2 10*3/uL — ABNORMAL HIGH (ref 1.7–7.7)
Neutrophils Relative %: 82 %
Platelets: 403 10*3/uL — ABNORMAL HIGH (ref 150–400)
RBC: 2.98 MIL/uL — ABNORMAL LOW (ref 4.22–5.81)
RDW: 16.2 % — ABNORMAL HIGH (ref 11.5–15.5)
Smear Review: NORMAL
WBC: 17.1 10*3/uL — ABNORMAL HIGH (ref 4.0–10.5)
nRBC: 0 % (ref 0.0–0.2)

## 2023-07-13 LAB — GLUCOSE, CAPILLARY
Glucose-Capillary: 103 mg/dL — ABNORMAL HIGH (ref 70–99)
Glucose-Capillary: 116 mg/dL — ABNORMAL HIGH (ref 70–99)
Glucose-Capillary: 166 mg/dL — ABNORMAL HIGH (ref 70–99)
Glucose-Capillary: 145 mg/dL — ABNORMAL HIGH (ref 70–99)

## 2023-07-13 MED ORDER — TRAMADOL HCL 50 MG PO TABS
50.0000 mg | ORAL_TABLET | Freq: Two times a day (BID) | ORAL | Status: DC | PRN
  Administered 2023-07-13 – 2023-08-21 (×17): 50 mg via ORAL
  Filled 2023-07-13 (×17): qty 1

## 2023-07-13 MED ORDER — CHLORHEXIDINE GLUCONATE CLOTH 2 % EX PADS
6.0000 | MEDICATED_PAD | Freq: Two times a day (BID) | CUTANEOUS | Status: DC
  Administered 2023-07-14 – 2023-07-16 (×3): 6 via TOPICAL

## 2023-07-13 NOTE — Progress Notes (Signed)
 [facility]patient [facility] Individual Statement of Services  Patient Name:  Calvin Schultz  Date:  07/13/2023  Welcome to the Inpatient Rehabilitation Center.  Our goal is to provide you with an individualized program based on your diagnosis and situation, designed to meet your specific needs.  With this comprehensive rehabilitation program, you will be expected to participate in at least 3 hours of rehabilitation therapies Monday-Friday, with modified therapy programming on the weekends.  Your rehabilitation program will include the following services:  Physical Therapy (PT), Occupational Therapy (OT), 24 hour per day rehabilitation nursing, Neuropsychology, Care Coordinator, Rehabilitation Medicine, Nutrition Services, and Pharmacy Services  Weekly team conferences will be held on Wednesday to discuss your progress.  Your Inpatient Rehabilitation Care Coordinator will talk with you frequently to get your input and to update you on team discussions.  Team conferences with you and your family in attendance may also be held.  Expected length of stay: 3 weeks  Overall anticipated outcome: min assist level  Depending on your progress and recovery, your program may change. Your Inpatient Rehabilitation Care Coordinator will coordinate services and will keep you informed of any changes. Your Inpatient Rehabilitation Care Coordinator's name and contact numbers are listed  below.  The following services may also be recommended but are not provided by the Inpatient Rehabilitation Center:  Driving Evaluations Home Health Rehabiltiation Services Outpatient Rehabilitation Services    Arrangements will be made to provide these services after discharge if needed.  Arrangements include referral to agencies that provide these services.  Your insurance has been verified to be:  Community education officer and Medicare part A Your primary doctor is:  Brett Fairy  Pertinent information will be shared with your doctor and  your insurance company.  Inpatient Rehabilitation Care Coordinator:  Dossie Der, [place] [phone] or Luna Glasgow  Information discussed with and copy given to patient by: Lucy Chris, 07/13/2023, 11:24 AM

## 2023-07-13 NOTE — Progress Notes (Signed)
 Physical Therapy Session Note  Patient Details  Name: Joshuah Minella MRN: [id number] Date of Birth: 1951/01/31  Today's Date: 07/13/2023 PT Individual Time: 1000-1110 PT Individual Time Calculation (min): 70 min   Short Term Goals: Week 1:  PT Short Term Goal 1 (Week 1): Pt will complete bed mobility with modA consistently. PT Short Term Goal 2 (Week 1): Pt will complete sit to stand with maxA. PT Short Term Goal 3 (Week 1): Pt will complete bed to chair with maxA consistently. PT Short Term Goal 4 (Week 1): Pt will initiate gait training.  Skilled Therapeutic Interventions/Progress Updates:     Pt seated in TIS WC upon arrival. Pt agreeable to therapy. Pt reports 9/10 B knee pain, R knee > L knee today. Pt requesting pain medicine. Notified nursing.   Education provided to pt and pt wife regarding tilt function for pressure relief in WC. Therapist provided recommendation of pt wife adjusting pt positioning every 30 min for accurate pressure relief. Pt also has roho cushion in TIS for pressure relief.   Pt performed the following therex while seated in chair for B LE/core  strengthening/ROM:   1x10 active seated heel slides B with foot on towel  1x10 active assisted heel slides B with foot on towel, and pt assisting with motion for improved movement within available range-empty end feel   Upright sitting forward trunk lean x10, verbal cues provided for encouragement 2/2 pt FOF   +2 available, attempted sit to stand with steady. Discontinued for safety/pt pain management as pt unable to tolerate adequate knee flexion positioning for sufficient leverage   Pt performed slide board transfer TIS to bed with +2 max A, verbal and tactile cues provided for technique sequencing.   Pt performed lateral scooting along edge of bed with +1 max A, verbal and tactile cues provided for technique, sequencing, with emphasis on anterior weight shift for buttocks clearance, pt demos little to no clearance  without assistance.   Sit to supine with max A, for B LE, verbal cues provided for technique/sequencing.   Pt scooted up in bed with use of B UE on HOB, and bed tilted, verbal cues provided for technique.   Pt performed 1x10 active assisted supine knee to chest to assess if pt would be appropriate for CPM. Notified team via secure chat to request CPM for improved knee flexion ROM tolerance.   Education provided for active assisted knee flexion with use of gait belt, pt performed x10 B. Therapist encouraged pt to perform while in bed to promote increased knee flexion ROM for independence with mobility/transfers.   Pt supine in bed with all needs within reach and bed alarm on.       Therapy Documentation Precautions:  Precautions Precautions: Fall Precaution/Restrictions Comments: L nephrostomy tube, foley catheter Restrictions Weight Bearing Restrictions Per Provider Order: No  Therapy/Group: Individual Therapy  [facility] T[place], Collins, DPT  07/13/2023, 7:54 AM

## 2023-07-13 NOTE — Progress Notes (Signed)
 Occupational Therapy Session Note  Patient Details  Name: Cleburne Savini MRN: [id number] Date of Birth: May 28, 1950  {CHL IP REHAB OT TIME CALCULATIONS:[id number]}   Short Term Goals: Week 1:  OT Short Term Goal 1 (Week 1): The pt will transfer from supine in bed to chair LOF with ModA using a sliding board x2 at 95% safe. OT Short Term Goal 2 (Week 1): The pt will complete UB dressing with s/u A at 95% safe OT Short Term Goal 3 (Week 1): The pt will complete LB bathing with  MinA incorporating AE at 95% safe OT Short Term Goal 4 (Week 1): The pt will complete LB dressing with MinA incorporating AE at 95% safe OT Short Term Goal 5 (Week 1): The pt will tolerate > 45 minutes of OT activity at 95% safe.  Skilled Therapeutic Interventions/Progress Updates:    Patient agreeable to participate in OT session. Reports *** pain level.   Patient participated in skilled OT session focusing on ***. Therapist facilitated/assessed/developed/educated/integrated/elicited *** in order to improve/facilitate/promote    Therapy Documentation Precautions:  Precautions Precautions: Fall Precaution/Restrictions Comments: L nephrostomy tube, foley catheter Restrictions Weight Bearing Restrictions Per Provider Order: No   Therapy/Group: Individual Therapy  Limmie Patricia, OTR/L,CBIS  Supplemental OT - [facility] and [facility] Secure Chat Preferred   07/13/2023, 11:46 PM

## 2023-07-13 NOTE — Progress Notes (Addendum)
 PROGRESS NOTE   Subjective/Complaints: No major issues. Does c/o bilateral knee pain. Therapy asked if knee sleeves might help.  Sleep is fair.  ROS: Patient denies fever, rash, sore throat, blurred vision, dizziness, nausea, vomiting, diarrhea, cough, shortness of breath or chest pain,  headache, or mood change.    Objective:   No results found.  Recent Labs    07/12/23 0731 07/13/23 0635  WBC 21.7* 17.1*  HGB 8.8* 8.2*  HCT 28.3* 26.6*  PLT 376 403*   Recent Labs    07/12/23 0731 07/13/23 0635  NA 130* 131*  K 4.8 4.1  CL 103 104  CO2 16* 19*  GLUCOSE 120* 107*  BUN 57* 56*  CREATININE 5.10* 5.16*  CALCIUM 8.1* 8.0*    Intake/Output Summary (Last 24 hours) at 07/13/2023 1304 Last data filed at 07/13/2023 0909 Gross per 24 hour  Intake 840 ml  Output 1150 ml  Net -310 ml     Pressure Injury 07/10/23 Buttocks Right;Mid;Upper Stage 2 -  Partial thickness loss of dermis presenting as a shallow open injury with a red, pink wound bed without slough. Pink opened stage 2 right upper buttock (Active)  07/10/23 1608  Location: Buttocks  Location Orientation: Right;Mid;Upper  Staging: Stage 2 -  Partial thickness loss of dermis presenting as a shallow open injury with a red, pink wound bed without slough.  Wound Description (Comments): Pink opened stage 2 right upper buttock  Present on Admission: Yes     Pressure Injury 07/10/23 Buttocks Right;Mid;Upper Stage 2 -  Partial thickness loss of dermis presenting as a shallow open injury with a red, pink wound bed without slough. Pink stage 2 directly beside the right upper stage 2 (Active)  07/10/23 1609  Location: Buttocks  Location Orientation: Right;Mid;Upper  Staging: Stage 2 -  Partial thickness loss of dermis presenting as a shallow open injury with a red, pink wound bed without slough.  Wound Description (Comments): Pink stage 2 directly beside the right upper  stage 2  Present on Admission: Yes     Pressure Injury 07/10/23 Buttocks Right;Mid;Lower Stage 2 -  Partial thickness loss of dermis presenting as a shallow open injury with a red, pink wound bed without slough. Pink stage 2 to right buttock below the upper stage 2 (Active)  07/10/23 1610  Location: Buttocks  Location Orientation: Right;Mid;Lower  Staging: Stage 2 -  Partial thickness loss of dermis presenting as a shallow open injury with a red, pink wound bed without slough.  Wound Description (Comments): Pink stage 2 to right buttock below the upper stage 2  Present on Admission: Yes     Pressure Injury 07/10/23 Buttocks Left;Mid;Lower Stage 2 -  Partial thickness loss of dermis presenting as a shallow open injury with a red, pink wound bed without slough. Pink stage 2 to left buttock (Active)  07/10/23 1611  Location: Buttocks  Location Orientation: Left;Mid;Lower  Staging: Stage 2 -  Partial thickness loss of dermis presenting as a shallow open injury with a red, pink wound bed without slough.  Wound Description (Comments): Pink stage 2 to left buttock  Present on Admission: Yes    Physical Exam: Vital Signs  Blood pressure (!) 91/56, pulse 60, temperature 97.7 F (36.5 C), temperature source Oral, resp. rate 18, height 5\' 9"  (1.753 m), weight 105.6 kg, SpO2 98%.    Constitutional: No distress . Vital signs reviewed. HEENT: NCAT, EOMI, oral membranes moist Neck: supple Cardiovascular: RRR without murmur. No JVD    Respiratory/Chest: CTA Bilaterally without wheezes or rales. Normal effort    GI/Abdomen: BS +, non-tender, non-distended Ext: no clubbing, cyanosis, or edema Psych: flat but cooperative  Neurological: HOH- alert Skin: C/D/I. No apparent lesions.  Tube sites appear clean/dry/intact.  Mepilex on left shoulder. Left drain in place/flank with clear yellow fluid -Buttocks MASD    MSK:      Bilateral knees L>>R tender to palpation, greater on the medial aspect, with  some surrounding warmth.  No palpable effusion.  No erythema.  No range of motion deficit.       Neurologic exam:  Cognition: AAO to person, place, time and event.  Mood: Pleasant affect, appropriate mood.  Sensation: To light touch intact in BL UEs and LEs  Reflexes: 2+ in BL UE and LEs. Negative Hoffman's and babinski signs bilaterally.  CN: 2-12 grossly intact.       Strength:                RUE: 5/5 SA, 5/5 EF, 5/5 EE, 5/5 WE, 5/5 FF, 5/5 FA                LUE:  5/5 SA, 5/5 EF, 5/5 EE, 5/5 WE, 5/5 FF, 5/5 FA                RLE: 4/5 HF, 4/5 KE, 5/5  DF, 5/5  EHL, 5/5  PF                 LLE:  3/5 HF, 4-/5 KE, 5/5  DF, 5/5  EHL, 5/5  PF       Assessment/Plan: 1. Functional deficits which require 3+ hours per day of interdisciplinary therapy in a comprehensive inpatient rehab setting. Physiatrist is providing close team supervision and 24 hour management of active medical problems listed below. Physiatrist and rehab team continue to assess barriers to discharge/monitor patient progress toward functional and medical goals  Care Tool:  Bathing  Bathing activity did not occur:  (patient completed a simulated task at bed LOF) Body parts bathed by patient: Right arm, Left arm, Chest, Abdomen, Front perineal area, Right upper leg, Left upper leg, Face   Body parts bathed by helper: Buttocks, Left lower leg, Right lower leg     Bathing assist Assist Level: Maximal Assistance - Patient 24 - 49%     Upper Body Dressing/Undressing Upper body dressing   What is the patient wearing?: Pull over shirt    Upper body assist Assist Level: Minimal Assistance - Patient > 75%    Lower Body Dressing/Undressing Lower body dressing      What is the patient wearing?: Pants, Underwear/pull up, Incontinence brief     Lower body assist Assist for lower body dressing: Maximal Assistance - Patient 25 - 49%     Toileting Toileting    Toileting assist Assist for toileting: Dependent - Patient  0%     Transfers Chair/bed transfer  Transfers assist  Chair/bed transfer activity did not occur: Safety/medical concerns (not safe to get up)  Chair/bed transfer assist level: Total Assistance - Patient < 25%     Locomotion Ambulation   Ambulation assist   Ambulation activity did not  occur: Safety/medical concerns          Walk 10 feet activity   Assist  Walk 10 feet activity did not occur: Safety/medical concerns        Walk 50 feet activity   Assist Walk 50 feet with 2 turns activity did not occur: Safety/medical concerns         Walk 150 feet activity   Assist Walk 150 feet activity did not occur: Safety/medical concerns         Walk 10 feet on uneven surface  activity   Assist Walk 10 feet on uneven surfaces activity did not occur: Safety/medical concerns         Wheelchair     Assist Is the patient using a wheelchair?: Yes Type of Wheelchair: Manual    Wheelchair assist level: Dependent - Patient 0% Max wheelchair distance: 150'    Wheelchair 50 feet with 2 turns activity    Assist        Assist Level: Dependent - Patient 0%   Wheelchair 150 feet activity     Assist      Assist Level: Dependent - Patient 0%   Blood pressure (!) 91/56, pulse 60, temperature 97.7 F (36.5 C), temperature source Oral, resp. rate 18, height 5\' 9"  (1.753 m), weight 105.6 kg, SpO2 98%.   Medical Problem List and Plan: 1. Functional deficits secondary to debility related to septic shock/C. difficile colitis/AKI superimposed on CKD stage IV             -patient may shower with tubes/drains covered             -ELOS/Goals: 18 to 21 days, min assist PT/OT            -Continue CIR therapies including PT, OT   WBC 21.7- per ID recheck in Am- wait to start ABX due to recent C diff  Luckily no obvious repercussions from fall.  2.  Antithrombotics: -DVT/anticoagulation:  Pharmaceutical: Eliquis             -antiplatelet therapy: N/A    3. Pain Management: Oxycodone as needed, Zanaflex 4 mg twice daily as needed   3/8-3/9 said knee hurting as well as buttocks- but is controlled with current meds  3/10 wife will try to bring in neoprine knee sleeves from home 4. Mood/Behavior/Sleep: Trazodone 50 mg nightly as needed.  Provide emotional support             -antipsychotic agents: N/A   5. Neuropsych/cognition: This patient is capable of making decisions on his own behalf.   6. Skin/Wound Care: Routine skin checks   7. Fluids/Electrolytes/Nutrition: Routine in and outs with follow-up chemistries   8.  Oliguric AKI superimposed on CKD stage IV.  Baseline creatinine 4.9.  CRRT discontinued 2/25.  No current plan for long-term dialysis.  Follow-up renal services              - Per discussion with nephrology Dr. Verna Czech, keep Foley catheter in to allow accurate I's and O's through Monday; then, can remove and do DC Foley trial while continuing to document strict outputs             - Continue daily assessments of renal function per nephrology   3/8- per renal, Cr leveling out- and con't Foley- will remove Monday  3/10- will remove foley today--strict I's and O's 9.  C. difficile colitis.  Completing course of oral vancomycin 3/7.  DC'd IV Flagyl 3/3. Enteric precautions  3/9- spoke with pahrmacy- pt has adequate tx for Cdiff and LBM yesterday- is improved 10.  Status post left nephrostomy tube d/t staghorn calculi. Has Foley Nephrostomy tube placed 1/29 per IR with plan for ureteric stent placement 3/21              - Careful monitoring of nephrostomy output 11.  Chronic atrial fibrillation with episodes of bradycardia.  Followed by cardiology services.  Continue Eliquis.  Avoid AV nodal blocking agents   12.  Diabetes mellitus.  Hemoglobin A1c 6.2.  SSI  3/8-3/9 Cbgs controlled overall- con't regimen for now      Recent Labs    07/10/23 0801 07/10/23 1154 07/10/23 1635  GLUCAP 111* 165* 155*     13.  Hypotension.   ProAmatine 10 mg 3 times daily.  Monitor with increased mobility   - Normotensive on admission  3/9- BP running 100-s to 120s systolic- con't regimen 14.  Chronic anemia.  Niferex daily/Aranesp.  Monitor for any bleeding episodes   3/8- Hb 7.1 today- will order transfusion- transfusion of 1 unit pRBCs.   3/9- Hb up to 8.8- con't to monitor trend 15.  Bilateral knee pain, L>>R.  Baseline manages with Tylenol.              - Tylenol 650 every 4 hours as needed              - Voltaren gel to bilateral knees 4 times daily              - R knee xray 2019 showing tricompartmental arthritis; ordered L knee xray for AM  3/8- Xray shows severe tricompartment DJD- with small to moderate effusion- if pain doesn't improve, can call ortho to tap  3/10- see discussion above  16. Leukocytosis  3/9- Pt's WBC is 21.7- up from 16k- is afebrile- due to his recent C diff, that just finished correct dosing of ABX for, called ID for guidance on treatment- don't want to cause more Cdiff- ID consulted ---no new orders  3/10 WBC's down to 17k today--have been hovering above and below 20 for awhile. No new clinical symptoms. Nephrostomy drainage clear, yellow   -recheck Wednesday    LOS: 3 days A FACE TO FACE EVALUATION WAS PERFORMED  Ranelle Oyster 07/13/2023, 1:04 PM

## 2023-07-13 NOTE — IPOC Note (Signed)
 Overall Plan of Care [facility]) Patient Details Name: Calvin Schultz MRN: [id number] DOB: Jun 28, 1950  Admitting Diagnosis: Debility  Hospital Problems: Principal Problem:   Debility     Functional Problem List: Nursing Bowel, Bladder, Pain, Safety, Endurance, Medication Management, Skin Integrity  PT Balance, Behavior, Endurance, Motor, Pain, Safety, Sensory  OT Endurance, Balance (functional mobility)  SLP    TR         Basic ADL's: OT Dressing, Bathing, Toileting     Advanced  ADL's: OT Simple Meal Preparation, Light Housekeeping     Transfers: PT Bed Mobility, Bed to Chair, Car, Occupational psychologist, Research scientist (life sciences): PT Ambulation, Psychologist, prison and probation services, Stairs     Additional Impairments: OT Other (comment) (challenges with flexion/extension of the knees and carrying weight through the extremities.)  SLP        TR      Anticipated Outcomes Item Anticipated Outcome  Self Feeding s/u A  Swallowing      Basic self-care  ModI  Toileting  ModI (with proper equipment and consistent improvements with functional mobility)   Bathroom Transfers ModI (with functional gains in mobility)  Bowel/Bladder  manage bowel w mod I and bladder w toileting  Transfers  MinA  Locomotion  MinA household distances  Communication     Cognition     Pain  < 4 with prns  Safety/Judgment  manage safety w cues   Therapy Plan: PT Intensity: Minimum of 1-2 x/day ,45 to 90 minutes PT Frequency: 5 out of 7 days PT Duration Estimated Length of Stay: ~3 weeks OT Intensity: Minimum of 1-2 x/day, 45 to 90 minutes OT Frequency: Total of 15 hours over 7 days of combined therapies OT Duration/Estimated Length of Stay: 3wks     Team Interventions: Nursing Interventions Patient/Family Education, Pain Management, Medication Management, Bladder Management, Discharge Planning, Skin Care/Wound Management, Bowel Management, Disease Management/Prevention  PT interventions  Ambulation/gait training, Community reintegration, DME/adaptive equipment instruction, Neuromuscular re-education, Psychosocial support, Stair training, UE/LE Strength taining/ROM, Wheelchair propulsion/positioning, Warden/ranger, Discharge planning, Functional electrical stimulation, Pain management, Skin care/wound management, Therapeutic Activities, UE/LE Coordination activities, Cognitive remediation/compensation, Disease management/prevention, Functional mobility training, Patient/family education, Splinting/orthotics, Therapeutic Exercise, Visual/perceptual remediation/compensation  OT Interventions Self Care/advanced ADL retraining, Therapeutic Exercise, UE/LE Coordination activities, Therapeutic Activities, UE/LE Strength taining/ROM, Functional mobility training, DME/adaptive equipment instruction, Balance/vestibular training  SLP Interventions    TR Interventions    SW/CM Interventions Discharge Planning, Psychosocial Support, Patient/Family Education   Barriers to Discharge MD  Medical stability  Nursing Decreased caregiver support 2 level main B+B w ramped entry with spouse  PT Other (comments), Incontinence severe knee pain limiting mobility  OT Hemodialysis    SLP      SW Decreased caregiver support, Insurance for SNF coverage, Other (comments) bad knees   Team Discharge Planning: Destination: PT-Home ,OT- Home , SLP-  Projected Follow-up: PT-Home health PT, 24 hour supervision/assistance, OT-  Other (comment) (To be determined prior to d/c based on performance.), SLP-  Projected Equipment Needs: PT-To be determined, OT- Tub/shower bench, 3 in 1 bedside comode, Sliding board, SLP-  Equipment Details: PT- , OT-  Patient/family involved in discharge planning: PT- Patient,  OT-Patient, Family member/caregiver, SLP-   MD ELOS: 21 days Medical Rehab Prognosis:  Excellent Assessment: The patient has been admitted for CIR therapies with the diagnosis of debility after  septic shock, C diff colitis. The team will be addressing functional mobility, strength, stamina, balance, safety, adaptive techniques and equipment, self-care, bowel  and bladder mgt, patient and caregiver education, pain mgt, community reentry. Goals have been set at mod I for self-care and min assist for mobility. Anticipated discharge destination is home with wife.        See Team Conference Notes for weekly updates to the plan of care

## 2023-07-13 NOTE — Progress Notes (Signed)
 Inpatient Rehabilitation Care Coordinator Assessment and Plan Patient Details  Name: Calvin Schultz MRN: [id number] Date of Birth: 06/20/1950  Today's Date: 07/13/2023  Hospital Problems: Principal Problem:   Debility  Past Medical History:  Past Medical History:  Diagnosis Date   Arthritis    CKD (chronic kidney disease) stage 3, GFR 30-59 ml/min (HCC)    Diabetes mellitus without complication (HCC)    Hyperlipidemia    Hypertension    Past Surgical History:  Past Surgical History:  Procedure Laterality Date   IR NEPHROSTOMY PLACEMENT LEFT  06/03/2023   KIDNEY SURGERY     Social History:  reports that he has never smoked. He has never used smokeless tobacco. He reports that he does not drink alcohol and does not use drugs.  Family / Support Systems Marital Status: Married Patient Roles: Spouse, Parent, Other (Comment) (grandfather) Spouse/Significant Other: Nettie Elm [phone] Children: Stacy-daughter was a pt here after BKA Other Supports: Grandson at [employer] Anticipated Caregiver: Wife Ability/Limitations of Caregiver: Works days at Engineer, mining but plans on taking FMLA has a bad knee also Caregiver Availability: 24/7 (short time) Family Dynamics: Close knit family has had daughter staying with them since her BKA planning on going back to her home once the adaptations have been made. Pt was assisting her with her care.  Social History Preferred language: English Religion: Unknown Cultural Background: NA Education: HS Health Literacy - How often do you need to have someone help you when you read instructions, pamphlets, or other written material from your doctor or pharmacy?: Never Writes: Yes Employment Status: Retired Marine scientist Issues: No issues Guardian/Conservator: None-according to MD pt is capable of making his own decisions while here. Will include wife per his request   Abuse/Neglect Abuse/Neglect Assessment Can Be Completed: Yes Physical Abuse:  Denies Verbal Abuse: Denies Sexual Abuse: Denies Exploitation of patient/patient's resources: Denies Self-Neglect: Denies  Patient response to: Social Isolation - How often do you feel lonely or isolated from those around you?: Rarely  Emotional Status Pt's affect, behavior and adjustment status: Pt is motivated to improve and regain his mobility he was getting around with cane prior to this due to his bad knees. He feels they have gotten worse since being in the hospital. he has multiple medical issues and will need to work on his strenghtening and deconditioning Recent Psychosocial Issues: other health issues-neph tube had pta Psychiatric History: No issues multiple hospitalizations may benefit from seeing neuro-psych while here Substance Abuse History: NA  Patient / Family Perceptions, Expectations & Goals Pt/Family understanding of illness & functional limitations: Pt  and wife have spoken with the MD's invovled and feel have a good understanding of his health issues and treatment moving forward. Premorbid pt/family roles/activities: husband, father, grandfather, retiree, etc Anticipated changes in roles/activities/participation: resume Pt/family expectations/goals: Pt states: " I hope to do well and get more mobile my knees have gotten worse since being here. "  Wife states": I am hopeful he will do well here."  [employer]: None Premorbid Home Care/DME Agencies: Other (Comment) (cane has ramp for daughter) Transportation available at discharge: self and wife Is the patient able to respond to transportation needs?: Yes In the past 12 months, has lack of transportation kept you from medical appointments or from getting medications?: No In the past 12 months, has lack of transportation kept you from meetings, work, or from getting things needed for daily living?: No Resource referrals recommended: Neuropsychology  Discharge Planning Living Arrangements:  Spouse/significant other,  Children Support Systems: Spouse/significant other, Children, Other relatives, Friends/neighbors Type of Residence: Private residence Insurance Resources: Media planner (specify), [employer] Administrator) Financial Resources: Social Security, Family Support Financial Screen Referred: No Living Expenses: Own Money Management: Patient, Spouse Does the patient have any problems obtaining your medications?: No Home Management: both Patient/Family Preliminary Plans: Return home with wife taking a FMLA and daughter is there for a short time. She does work during the day Wife has a bad knee also so hopefully pt will get to a levle she can manage him. Goals are for min assist. Care Coordinator Barriers to Discharge: Decreased caregiver support, Insurance for SNF coverage, Other (comments) Care Coordinator Barriers to Discharge Comments: bad knees Care Coordinator Anticipated Follow Up Needs: HH/OP  Clinical Impression Pleasant gentleman who is motivated to do well. His wife is supportive and both familiar with rehab since their daughter was here after having a BKA she is doing well but waiting to have condo adapted bathroom and carpet removed before moving back. Wife to take FMLA since she still works full time. Will work on discharge needs both aware he will require assist when discharged. Update on Wednesday  Lucy Chris 07/13/2023, 11:22 AM

## 2023-07-13 NOTE — Progress Notes (Signed)
 Inpatient Rehabilitation  Patient information reviewed and entered into eRehab system by Cheri Rous, OTR/L, Rehab Quality Coordinator.   Information including medical coding, functional ability and quality indicators will be reviewed and updated through discharge.

## 2023-07-13 NOTE — Progress Notes (Signed)
 [facility] NEPHROLOGY PROGRESS NOTE  Assessment/ Plan: Pt is a 73 y.o. yo male    # AKI on CKD 4 - AKI is ischemic and pre-renal insults related to hypotension +/- ARB and intravasc vol depletion.  CT w/o obstruction, has L neph tube.  He was on CRRT from 2/22 - 2/25.  Note that Cr 4.06- 5.3 from jan 2025, eGFR 11-15 ml/min.  For reference in 01/2022 with Cr 2.48 from Care Everywhere Cont holding HD and watching labs and UOP; creatinine is leveling out No vascular access currenlty, TDC if needed  Removed foley catheter, has PCN No Need for HD today.  # Metabolic acidosis -Start sod bicarb. #Shock/ hypotension - resolved #L nephrostomy tube - placed in Jan 2025 by IR, and is f/b urology. Has staghorn calculi on the L and had previously been scheduled to have stone removed and stent placed per his wife on March 7th at [facility]. Postponed #Urinary retention - as above, per CIR #Chronic atrial fib - on eliquis; per primary team  #Hypophosphatemia secondary to CRRT - resolved #Hypokalemia -  resolved #Anemia normocytic: cont ESA; PRBC's per primary team  Subjective: No new event.  Working with therapist in the rehab.  His wife was present with him.  Urine output around 1.1 L in 24 hours. Objective Vital signs in last 24 hours: Vitals:   07/12/23 1935 07/13/23 0209 07/13/23 0607 07/13/23 1349  BP: 123/78 (!) 91/56  109/65  Pulse: (!) 58 60  69  Resp: 18 18  16   Temp: 98 F (36.7 C) 97.7 F (36.5 C)  98 F (36.7 C)  TempSrc: Oral Oral    SpO2: 99% 98%  98%  Weight:   105.6 kg   Height:       Weight change: -0.1 kg  Intake/Output Summary (Last 24 hours) at 07/13/2023 1513 Last data filed at 07/13/2023 0909 Gross per 24 hour  Intake 840 ml  Output 1150 ml  Net -310 ml       Labs: RENAL PANEL Recent Labs  Lab 07/07/23 0247 07/08/23 0414 07/09/23 0513 07/10/23 0600 07/11/23 0627 07/12/23 0731 07/13/23 0635  NA 130* 130* 129* 131* 133* 130* 131*  K  3.6 3.4* 4.4 4.4 4.6 4.8 4.1  CL 100 101 103 103 103 103 104  CO2 21* 21* 20* 21* 21* 16* 19*  GLUCOSE 131* 106* 99 109* 146* 120* 107*  BUN 38* 42* 46* 52* 54* 57* 56*  CREATININE 4.71* 4.93* 4.98* 5.04* 5.08* 5.10* 5.16*  CALCIUM 7.9* 8.0* 7.6* 7.9* 8.1* 8.1* 8.0*  MG 1.8 1.9 1.9 1.9  --   --   --   PHOS 4.7* 4.9* 5.1* 5.4*  --   --   --   ALBUMIN 1.8* 1.9* 1.9* 1.9* 1.7*  --   --     Liver Function Tests: Recent Labs  Lab 07/09/23 0513 07/10/23 0600 07/11/23 0627  AST  --   --  11*  ALT  --   --  11  ALKPHOS  --   --  72  BILITOT  --   --  0.7  PROT  --   --  4.8*  ALBUMIN 1.9* 1.9* 1.7*   No results for input(s): "LIPASE", "AMYLASE" in the last 168 hours. No results for input(s): "AMMONIA" in the last 168 hours. CBC: Recent Labs    06/03/23 4098 06/04/23 0503 06/04/23 1126 06/05/23 1191 07/03/23 1514 07/04/23 0140 07/08/23 4782 07/09/23 9562 07/11/23 1308 07/12/23 6578 07/13/23  0635  HGB 10.2*   < >  --    < >  --    < > 7.5* 7.7* 7.1* 8.8* 8.2*  MCV 85.5   < >  --    < >  --    < > 90.2 90.2 90.1 89.3 89.3  FERRITIN  --   --  32  --  88  --   --   --   --   --   --   TIBC 384  --   --   --  179*  --   --   --   --   --   --   IRON 45  --   --   --  35*  --   --   --   --   --   --    < > = values in this interval not displayed.    Cardiac Enzymes: No results for input(s): "CKTOTAL", "CKMB", "CKMBINDEX", "TROPONINI" in the last 168 hours. CBG: Recent Labs  Lab 07/12/23 1125 07/12/23 1727 07/12/23 2050 07/13/23 0541 07/13/23 1203  GLUCAP 145* 179* 160* 103* 116*    Iron Studies: No results for input(s): "IRON", "TIBC", "TRANSFERRIN", "FERRITIN" in the last 72 hours. Studies/Results: No results found.  Medications: Infusions:   Scheduled Medications:  apixaban  5 mg Oral BID   Chlorhexidine Gluconate Cloth  6 each Topical Q12H   darbepoetin (ARANESP) injection - NON-DIALYSIS  40 mcg Subcutaneous Q Fri-1800   diclofenac Sodium  2 g Topical QID    insulin aspart  0-9 Units Subcutaneous TID WC   iron polysaccharides  150 mg Oral Daily   midodrine  10 mg Oral TID WC   multivitamin  1 tablet Oral QHS    have reviewed scheduled and prn medications.  Physical Exam: General:NAD, comfortable Heart:RRR, s1s2 nl Lungs:clear b/l, no crackle Abdomen:soft, Non-tender, non-distended Extremities:No edema Dialysis Access: None.  Calvin Schultz 07/13/2023,3:13 PM  LOS: 3 days

## 2023-07-13 NOTE — Progress Notes (Signed)
 Patient ID: Calvin Schultz, male   DOB: 25-Dec-1950, 73 y.o.   MRN: [id number] Met with the patient and wife to review current medical situation, rehab process, team conference and plan of care. Discussed medication for secondary risk and CMM diet/2000 cc FR. Reviewed protein options. Working on skin care and plan for foley removal and continue with nephrostomy tube. Patient limited by knee pain; ? Tramadol prn as oxy made him drowsy. Continue to follow along to address educational needs to facilitate preparation for discharge. Pamelia Hoit

## 2023-07-13 NOTE — Progress Notes (Signed)
 Occupational Therapy Session Note  Patient Details  Name: Calvin Schultz MRN: [id number] Date of Birth: 1950/10/26  Today's Date: 07/13/2023 OT Individual Time: 0805-0900 OT Individual Time Calculation (min): 55 min   Today's Date: 07/13/2023 OT Individual Time: 1335-1445 OT Individual Time Calculation (min): 70 min   Short Term Goals: Week 1:  OT Short Term Goal 1 (Week 1): The pt will transfer from supine in bed to chair LOF with ModA using a sliding board x2 at 95% safe. OT Short Term Goal 2 (Week 1): The pt will complete UB dressing with s/u A at 95% safe OT Short Term Goal 3 (Week 1): The pt will complete LB bathing with  MinA incorporating AE at 95% safe OT Short Term Goal 4 (Week 1): The pt will complete LB dressing with MinA incorporating AE at 95% safe OT Short Term Goal 5 (Week 1): The pt will tolerate > 45 minutes of OT activity at 95% safe.  Skilled Therapeutic Interventions/Progress Updates:   Session 1: Pt greeted resting in bed for skilled OT session with focus on BADL retraining and functional transfers.   Pain: Pt reported 9/10 B knee pain, pre-medicated. OT offering intermediate rest breaks and positioning suggestions throughout session to address pain/fatigue and maximize participation/safety in session. OT applies arthritic ointment.  Functional Transfers: Bed mobility with Mod A (x2) for management of BLE pain, slide-board transfer from EOB>TIS WC with Max A (x2).   Self Care Tasks: Pt completes the following self care tasks with levels of assistance noted below, UB: Donning of t-shirt at Mod A for RUE and bringing material down back. LB: Donning of knee-high TEDs and sweat-pants at total A (bed-level) due to increased B-knee pain.   Pt remained sitting in TIS WC with 4Ps assessed and immediate needs met. Pt continues to be appropriate for skilled OT intervention to promote further functional independence in ADLs/IADLs.   Session 2:  Pt greeted resting in bed for  skilled OT session with focus on functional transfers for carryover into ADL participation.   Pain: Pt with fluctuating/un-rated B-knee pain, OT offering intermediate rest breaks and positioning suggestions throughout session to address pain/fatigue and maximize participation/safety in session.   Pt transitions to EOB with Max A (x1), tolerating sitting EOB for upwards of 5 mins with close supervision + BUE on bed rails. Use of stedy attempted for progression with sit<>stand transfer, unsuccessful this session due to increased level of knee flexion required for lift mechanics. Huntley Dec plus retrieved and dependently setup for use. Sling with bottom support not available therefore standard belt attachment utilized and bed height adjusted accordingly to prevent patient from falling out of lift. Increased time and efforts required for patient to reach full-stance, slow pacing of all movement beneficial for pain management and dependent postioning of BLE on floor. Pt tolerates stance for ~2 mins. Returned to bed with Mod A (x2) and dependent boost towards HOB. Pt requires Mod-Max A for hip advancement towards/away from EOB for above mobility activities. Pt remained resting in bed with all needs met and spouse present in room.   Pt continues to be appropriate for skilled OT intervention to promote further functional independence in ADLs/IADLs.   Therapy Documentation Precautions:  Precautions Precautions: Fall Precaution/Restrictions Comments: L nephrostomy tube, foley catheter Restrictions Weight Bearing Restrictions Per Provider Order: No   Therapy/Group: Individual Therapy  Lou Cal, OTR/L, MSOT  07/13/2023, 6:17 AM

## 2023-07-14 DIAGNOSIS — A0472 Enterocolitis due to Clostridium difficile, not specified as recurrent: Secondary | ICD-10-CM | POA: Diagnosis not present

## 2023-07-14 DIAGNOSIS — D72823 Leukemoid reaction: Secondary | ICD-10-CM | POA: Diagnosis not present

## 2023-07-14 DIAGNOSIS — N179 Acute kidney failure, unspecified: Secondary | ICD-10-CM | POA: Diagnosis not present

## 2023-07-14 DIAGNOSIS — R5381 Other malaise: Secondary | ICD-10-CM | POA: Diagnosis not present

## 2023-07-14 LAB — GLUCOSE, CAPILLARY
Glucose-Capillary: 108 mg/dL — ABNORMAL HIGH (ref 70–99)
Glucose-Capillary: 142 mg/dL — ABNORMAL HIGH (ref 70–99)
Glucose-Capillary: 148 mg/dL — ABNORMAL HIGH (ref 70–99)
Glucose-Capillary: 115 mg/dL — ABNORMAL HIGH (ref 70–99)

## 2023-07-14 LAB — BASIC METABOLIC PANEL
Anion gap: 6 (ref 5–15)
BUN: 54 mg/dL — ABNORMAL HIGH (ref 8–23)
CO2: 19 mmol/L — ABNORMAL LOW (ref 22–32)
Calcium: 7.5 mg/dL — ABNORMAL LOW (ref 8.9–10.3)
Chloride: 106 mmol/L (ref 98–111)
Creatinine, Ser: 4.91 mg/dL — ABNORMAL HIGH (ref 0.61–1.24)
GFR, Estimated: 12 mL/min — ABNORMAL LOW (ref >60)
Glucose, Bld: 105 mg/dL — ABNORMAL HIGH (ref 70–99)
Potassium: 4.1 mmol/L (ref 3.5–5.1)
Sodium: 131 mmol/L — ABNORMAL LOW (ref 135–145)

## 2023-07-14 MED ORDER — TRAMADOL HCL 50 MG PO TABS
50.0000 mg | ORAL_TABLET | Freq: Two times a day (BID) | ORAL | Status: DC
  Administered 2023-07-14 – 2023-08-21 (×71): 50 mg via ORAL
  Filled 2023-07-14 (×74): qty 1

## 2023-07-14 NOTE — Progress Notes (Signed)
 Occupational Therapy Session Note  Patient Details  Name: Calvin Schultz MRN: [id number] Date of Birth: 11-13-1950  Today's Date: 07/14/2023 OT Individual Time: 1030-1045 OT Individual Time Calculation (min): 15 min  and Today's Date: 07/14/2023 OT Missed Time: 15 Minutes Missed Time Reason: Nursing care   Short Term Goals: Week 1:  OT Short Term Goal 1 (Week 1): The pt will transfer from supine in bed to chair LOF with ModA using a sliding board x2 at 95% safe. OT Short Term Goal 2 (Week 1): The pt will complete UB dressing with s/u A at 95% safe OT Short Term Goal 3 (Week 1): The pt will complete LB bathing with  MinA incorporating AE at 95% safe OT Short Term Goal 4 (Week 1): The pt will complete LB dressing with MinA incorporating AE at 95% safe OT Short Term Goal 5 (Week 1): The pt will tolerate > 45 minutes of OT activity at 95% safe.  Skilled Therapeutic Interventions/Progress Updates:    OT intervention with focus on TB transfer and bed mobility. Initiated BUE therex and nursing staff requested pt be returned to bed for nursing care. TB transfer to EOB with max A. Sit>supine with max A+2 and dependent for repositioning. Pt remained in bed with nursing staff present.   Therapy Documentation Precautions:  Precautions Precautions: Fall Precaution/Restrictions Comments: L nephrostomy tube, foley catheter Restrictions Weight Bearing Restrictions Per Provider Order: No General: General OT Amount of Missed Time: 15 Minutes   Pain: Pain Assessment Pain Scale: 0-10 Pain Score: 10-Worst pain ever Pain Type: Chronic pain Pain Location: Knee Pain Descriptors / Indicators: Aching Pain Frequency: Constant Pain Onset: On-going Pain Intervention(s): meds admin prior to therapy; returned to bed and repositioned  Therapy/Group: Individual Therapy  Rich Brave 07/14/2023, 10:54 AM

## 2023-07-14 NOTE — Progress Notes (Signed)
 PROGRESS NOTE   Subjective/Complaints: Slept fairly well. Feels tired. Still having knee pain bilaterally. Both knees swollen. Voltaren gel not helping a great deal  ROS: Patient denies fever, rash, sore throat, blurred vision, dizziness, nausea, vomiting, diarrhea, cough, shortness of breath or chest pain, headache, or mood change.    Objective:   No results found.  Recent Labs    07/12/23 0731 07/13/23 0635  WBC 21.7* 17.1*  HGB 8.8* 8.2*  HCT 28.3* 26.6*  PLT 376 403*   Recent Labs    07/13/23 0635 07/14/23 0507  NA 131* 131*  K 4.1 4.1  CL 104 106  CO2 19* 19*  GLUCOSE 107* 105*  BUN 56* 54*  CREATININE 5.16* 4.91*  CALCIUM 8.0* 7.5*    Intake/Output Summary (Last 24 hours) at 07/14/2023 0941 Last data filed at 07/14/2023 0600 Gross per 24 hour  Intake --  Output 600 ml  Net -600 ml     Pressure Injury 07/10/23 Buttocks Right;Mid;Upper Stage 2 -  Partial thickness loss of dermis presenting as a shallow open injury with a red, pink wound bed without slough. Pink opened stage 2 right upper buttock (Active)  07/10/23 1608  Location: Buttocks  Location Orientation: Right;Mid;Upper  Staging: Stage 2 -  Partial thickness loss of dermis presenting as a shallow open injury with a red, pink wound bed without slough.  Wound Description (Comments): Pink opened stage 2 right upper buttock  Present on Admission: Yes     Pressure Injury 07/10/23 Buttocks Right;Mid;Upper Stage 2 -  Partial thickness loss of dermis presenting as a shallow open injury with a red, pink wound bed without slough. Pink stage 2 directly beside the right upper stage 2 (Active)  07/10/23 1609  Location: Buttocks  Location Orientation: Right;Mid;Upper  Staging: Stage 2 -  Partial thickness loss of dermis presenting as a shallow open injury with a red, pink wound bed without slough.  Wound Description (Comments): Pink stage 2 directly beside  the right upper stage 2  Present on Admission: Yes     Pressure Injury 07/10/23 Buttocks Right;Mid;Lower Stage 2 -  Partial thickness loss of dermis presenting as a shallow open injury with a red, pink wound bed without slough. Pink stage 2 to right buttock below the upper stage 2 (Active)  07/10/23 1610  Location: Buttocks  Location Orientation: Right;Mid;Lower  Staging: Stage 2 -  Partial thickness loss of dermis presenting as a shallow open injury with a red, pink wound bed without slough.  Wound Description (Comments): Pink stage 2 to right buttock below the upper stage 2  Present on Admission: Yes     Pressure Injury 07/10/23 Buttocks Left;Mid;Lower Stage 2 -  Partial thickness loss of dermis presenting as a shallow open injury with a red, pink wound bed without slough. Pink stage 2 to left buttock (Active)  07/10/23 1611  Location: Buttocks  Location Orientation: Left;Mid;Lower  Staging: Stage 2 -  Partial thickness loss of dermis presenting as a shallow open injury with a red, pink wound bed without slough.  Wound Description (Comments): Pink stage 2 to left buttock  Present on Admission: Yes    Physical Exam: Vital Signs Blood  pressure 108/61, pulse 71, temperature 97.6 F (36.4 C), resp. rate 18, height 5\' 9"  (1.753 m), weight 105.6 kg, SpO2 98%.    Constitutional: No distress . Vital signs reviewed. HEENT: NCAT, EOMI, oral membranes moist Neck: supple Cardiovascular: RRR without murmur. No JVD    Respiratory/Chest: CTA Bilaterally without wheezes or rales. Normal effort    GI/Abdomen: BS +, non-tender, non-distended Ext: no clubbing, cyanosis, or edema Psych: sl flat but pleasant and cooperative  Neurological: HOH- alert Skin: C/D/I. No apparent lesions.  Tube sites appear clean/dry/intact.  Mepilex on left shoulder. Left drain in place/flank with clear yellow fluid -Buttocks MASD--not visualized today    MSK:     Both knees with effusions/chronic changes. Right  knee more pronounced than left. Did not attempt ROM today       Neurologic exam:  Cognition: AAO to person, place, time and event.  Mood: Pleasant affect, appropriate mood.  Sensation: To light touch intact in BL UEs and LEs  Reflexes: 2+ in BL UE and LEs. Negative Hoffman's and babinski signs bilaterally.  CN: 2-12 grossly intact.       Strength:                RUE: 5/5 SA, 5/5 EF, 5/5 EE, 5/5 WE, 5/5 FF, 5/5 FA                LUE:  5/5 SA, 5/5 EF, 5/5 EE, 5/5 WE, 5/5 FF, 5/5 FA                RLE: 4/5 HF, 4/5 KE, 5/5  DF, 5/5  EHL, 5/5  PF                 LLE:  3/5 HF, 4-/5 KE, 5/5  DF, 5/5  EHL, 5/5  PF       Assessment/Plan: 1. Functional deficits which require 3+ hours per day of interdisciplinary therapy in a comprehensive inpatient rehab setting. Physiatrist is providing close team supervision and 24 hour management of active medical problems listed below. Physiatrist and rehab team continue to assess barriers to discharge/monitor patient progress toward functional and medical goals  Care Tool:  Bathing  Bathing activity did not occur:  (patient completed a simulated task at bed LOF) Body parts bathed by patient: Right arm, Left arm, Chest, Abdomen, Front perineal area, Right upper leg, Left upper leg, Face   Body parts bathed by helper: Buttocks, Left lower leg, Right lower leg     Bathing assist Assist Level: Maximal Assistance - Patient 24 - 49%     Upper Body Dressing/Undressing Upper body dressing   What is the patient wearing?: Pull over shirt    Upper body assist Assist Level: Minimal Assistance - Patient > 75%    Lower Body Dressing/Undressing Lower body dressing      What is the patient wearing?: Pants, Underwear/pull up, Incontinence brief     Lower body assist Assist for lower body dressing: Maximal Assistance - Patient 25 - 49%     Toileting Toileting    Toileting assist Assist for toileting: Dependent - Patient 0%     Transfers Chair/bed  transfer  Transfers assist  Chair/bed transfer activity did not occur: Safety/medical concerns (not safe to get up)  Chair/bed transfer assist level: Total Assistance - Patient < 25%     Locomotion Ambulation   Ambulation assist   Ambulation activity did not occur: Safety/medical concerns          Walk  10 feet activity   Assist  Walk 10 feet activity did not occur: Safety/medical concerns        Walk 50 feet activity   Assist Walk 50 feet with 2 turns activity did not occur: Safety/medical concerns         Walk 150 feet activity   Assist Walk 150 feet activity did not occur: Safety/medical concerns         Walk 10 feet on uneven surface  activity   Assist Walk 10 feet on uneven surfaces activity did not occur: Safety/medical concerns         Wheelchair     Assist Is the patient using a wheelchair?: Yes Type of Wheelchair: Manual    Wheelchair assist level: Dependent - Patient 0% Max wheelchair distance: 150'    Wheelchair 50 feet with 2 turns activity    Assist        Assist Level: Dependent - Patient 0%   Wheelchair 150 feet activity     Assist      Assist Level: Dependent - Patient 0%   Blood pressure 108/61, pulse 71, temperature 97.6 F (36.4 C), resp. rate 18, height 5\' 9"  (1.753 m), weight 105.6 kg, SpO2 98%.   Medical Problem List and Plan: 1. Functional deficits secondary to debility related to septic shock/C. difficile colitis/AKI superimposed on CKD stage IV             -patient may shower with tubes/drains covered             -ELOS/Goals: 18 to 21 days, min assist PT/OT            -Continue CIR therapies including PT, OT, and SLP  2.  Antithrombotics: -DVT/anticoagulation:  Pharmaceutical: Eliquis             -antiplatelet therapy: N/A   3. Pain Management: tramadol prn, Zanaflex 4 mg twice daily as needed   Biliateral OA of knees  -wife will try to bring in neoprine knee sleeves from  home  -voltaren gel tid to knees  -will schedule tramadol 50mg  at 0700 and 1200 daily. Continue prn as well 4. Mood/Behavior/Sleep: Trazodone 50 mg nightly as needed.  Provide emotional support             -antipsychotic agents: N/A   5. Neuropsych/cognition: This patient is capable of making decisions on his own behalf.   6. Skin/Wound Care: Routine skin checks   7. Fluids/Electrolytes/Nutrition: Routine in and outs with follow-up chemistries   8.  Oliguric AKI superimposed on CKD stage IV.  Baseline creatinine 4.9.  CRRT discontinued 2/25.  No current plan for long-term dialysis.  Follow-up renal services              - Per discussion with nephrology Dr. Verna Czech, keep Foley catheter in to allow accurate I's and O's through Monday; then, can remove and do DC Foley trial while continuing to document strict outputs             - Continue daily assessments of renal function per nephrology   3/8- per renal, Cr leveling out- and con't Foley- will remove Monday  3/11 continue voiding trial. -strict I's and O's NOT RECORDED 9.  C. difficile colitis.  Completed course of oral vancomycin 3/7.  DC'd IV Flagyl 3/3. Enteric precautions   3/9- spoke with pahrmacy- pt has adequate tx for Cdiff and LBM yesterday- is improved 10.  Status post left nephrostomy tube d/t staghorn calculi. Has Foley Nephrostomy tube  placed 1/29 per IR with plan for ureteric stent placement 3/21              - Careful monitoring of nephrostomy output 11.  Chronic atrial fibrillation with episodes of bradycardia.  Followed by cardiology services.  Continue Eliquis.  Avoid AV nodal blocking agents   12.  Diabetes mellitus.  Hemoglobin A1c 6.2.  SSI  3/8-3/10 Cbgs under reasonable control. Continue with current regimen      Recent Labs    07/10/23 0801 07/10/23 1154 07/10/23 1635  GLUCAP 111* 165* 155*     13.  Hypotension.  ProAmatine 10 mg 3 times daily.  Monitor with increased mobility   - Normotensive on  admission  3/9- BP running 100-s to 120s systolic- con't regimen 14.  Chronic anemia.  Niferex daily/Aranesp.  Monitor for any bleeding episodes   3/8- Hb 7.1 today- will order transfusion- transfusion of 1 unit pRBCs.   3/9- Hb up to 8.8- con't to monitor trend    16. Leukocytosis  3/9- Pt's WBC is 21.7- up from 16k- is afebrile- due to his recent C diff, that just finished correct dosing of ABX for, called ID for guidance on treatment- don't want to cause more Cdiff- ID consulted ---no new orders  3/10 WBC's down to 17k  --have been hovering above and below 20 for awhile. No new clinical symptoms. Nephrostomy drainage clear, yellow  3/11 -recheck Wednesday    LOS: 4 days A FACE TO FACE EVALUATION WAS PERFORMED  Ranelle Oyster 07/14/2023, 9:41 AM

## 2023-07-14 NOTE — Progress Notes (Signed)
 [facility] NEPHROLOGY PROGRESS NOTE  Assessment/ Plan: Pt is a 73 y.o. yo male    # AKI on CKD 4 - AKI is ischemic and pre-renal insults related to hypotension +/- ARB and intravasc vol depletion.  CT w/o obstruction, has L neph tube.  He was on CRRT from 2/22 - 2/25.  Note that Cr 4.06- 5.3 from jan 2025, eGFR 11-15 ml/min.  For reference in 01/2022 with Cr 2.48 from [facility] Cont holding HD and watching labs and UOP; creatinine is leveling trending down No vascular access currenlty, TDC if needed  Removed foley catheter, has PCN No Need for HD today.  # Metabolic acidosis -on sod bicarb. #Shock/ hypotension - resolved #L nephrostomy tube - placed in Jan 2025 by IR, and is f/b urology. Has staghorn calculi on the L and had previously been scheduled to have stone removed and stent placed per his wife on March 7th at [facility]. Postponed #Urinary retention - as above, per CIR #Chronic atrial fib - on eliquis; per primary team  #Hypophosphatemia secondary to CRRT - resolved #Hypokalemia -  resolved #Anemia normocytic: cont ESA; PRBC's per primary team  Subjective: No new event.  Working with therapist in the rehab.  His wife was present with him.  Urine output 600 cc documented, creatinine level trending down.  Discussed with the patient's wife.  Objective Vital signs in last 24 hours: Vitals:   07/13/23 0607 07/13/23 1349 07/14/23 0500 07/14/23 0608  BP:  109/65  108/61  Pulse:  69  71  Resp:  16  18  Temp:  98 F (36.7 C)  97.6 F (36.4 C)  TempSrc:      SpO2:  98%  98%  Weight: 105.6 kg  105.6 kg   Height:       Weight change: 0 kg  Intake/Output Summary (Last 24 hours) at 07/14/2023 1301 Last data filed at 07/14/2023 1055 Gross per 24 hour  Intake --  Output 1050 ml  Net -1050 ml       Labs: RENAL PANEL Recent Labs  Lab 07/08/23 0414 07/09/23 0513 07/10/23 0600 07/11/23 0627 07/12/23 0731 07/13/23 0635 07/14/23 0507  NA 130* 129*  131* 133* 130* 131* 131*  K 3.4* 4.4 4.4 4.6 4.8 4.1 4.1  CL 101 103 103 103 103 104 106  CO2 21* 20* 21* 21* 16* 19* 19*  GLUCOSE 106* 99 109* 146* 120* 107* 105*  BUN 42* 46* 52* 54* 57* 56* 54*  CREATININE 4.93* 4.98* 5.04* 5.08* 5.10* 5.16* 4.91*  CALCIUM 8.0* 7.6* 7.9* 8.1* 8.1* 8.0* 7.5*  MG 1.9 1.9 1.9  --   --   --   --   PHOS 4.9* 5.1* 5.4*  --   --   --   --   ALBUMIN 1.9* 1.9* 1.9* 1.7*  --   --   --     Liver Function Tests: Recent Labs  Lab 07/09/23 0513 07/10/23 0600 07/11/23 0627  AST  --   --  11*  ALT  --   --  11  ALKPHOS  --   --  72  BILITOT  --   --  0.7  PROT  --   --  4.8*  ALBUMIN 1.9* 1.9* 1.7*   No results for input(s): "LIPASE", "AMYLASE" in the last 168 hours. No results for input(s): "AMMONIA" in the last 168 hours. CBC: Recent Labs    06/03/23 0647 06/04/23 0503 06/04/23 1126 06/05/23 0608 07/03/23 1514 07/04/23  0140 07/08/23 0414 07/09/23 0513 07/11/23 0627 07/12/23 0731 07/13/23 0635  HGB 10.2*   < >  --    < >  --    < > 7.5* 7.7* 7.1* 8.8* 8.2*  MCV 85.5   < >  --    < >  --    < > 90.2 90.2 90.1 89.3 89.3  FERRITIN  --   --  32  --  88  --   --   --   --   --   --   TIBC 384  --   --   --  179*  --   --   --   --   --   --   IRON 45  --   --   --  35*  --   --   --   --   --   --    < > = values in this interval not displayed.    Cardiac Enzymes: No results for input(s): "CKTOTAL", "CKMB", "CKMBINDEX", "TROPONINI" in the last 168 hours. CBG: Recent Labs  Lab 07/13/23 1203 07/13/23 1651 07/13/23 2126 07/14/23 0613 07/14/23 1200  GLUCAP 116* 166* 145* 108* 142*    Iron Studies: No results for input(s): "IRON", "TIBC", "TRANSFERRIN", "FERRITIN" in the last 72 hours. Studies/Results: No results found.  Medications: Infusions:   Scheduled Medications:  apixaban  5 mg Oral BID   Chlorhexidine Gluconate Cloth  6 each Topical Q12H   darbepoetin (ARANESP) injection - NON-DIALYSIS  40 mcg Subcutaneous Q Fri-1800    diclofenac Sodium  2 g Topical QID   insulin aspart  0-9 Units Subcutaneous TID WC   iron polysaccharides  150 mg Oral Daily   midodrine  10 mg Oral TID WC   multivitamin  1 tablet Oral QHS   traMADol  50 mg Oral BID    have reviewed scheduled and prn medications.  Physical Exam: General:NAD, comfortable Heart:RRR, s1s2 nl Lungs:clear b/l, no crackle Abdomen:soft, Non-tender, non-distended Extremities:No edema Dialysis Access: None.  Nethra Mehlberg Jaynie Collins 07/14/2023,1:01 PM  LOS: 4 days

## 2023-07-14 NOTE — Progress Notes (Signed)
 Physical Therapy Session Note  Patient Details  Name: Calvin Schultz MRN: [id number] Date of Birth: 24-Nov-1950  Today's Date: 07/14/2023 PT Individual Time: 2956-2130 PT Individual Time Calculation (min): 55 min   Short Term Goals: Week 1:  PT Short Term Goal 1 (Week 1): Pt will complete bed mobility with modA consistently. PT Short Term Goal 2 (Week 1): Pt will complete sit to stand with maxA. PT Short Term Goal 3 (Week 1): Pt will complete bed to chair with maxA consistently. PT Short Term Goal 4 (Week 1): Pt will initiate gait training.  Skilled Therapeutic Interventions/Progress Updates:    Session focused on functional bed mobility, slideboard transfers, adjustments to w/c, and therex to address UE and core strength. Pt reports significant fatigue from AM session but agreeable to session. Slow and extra time for rest breaks needed throughout. Mod to max assist for bed mobility due to assist with trunk and scooting to the edge of the bed with cues for technique. Extra time EOB with cues for deep breathing. Max +2 for slideboard transfer from bed > TIS w/c with focus on head hips relationship, hand placement, and anterior weightshift. Once in w/c, pt able to work on scooting reciprocally back in the w/c with cues for technique. RLE legrest adjusted for tolerance and support/positioning for improved sitting tolerance. Also added padding to the legrests where resting on outer knee area for skin protection with pool noodle that is easily removable for transfers as needed also. Pt reports overall improvement with comfort and tolerance and agreeable at end of session to stay up in w/c for lunch. While making adjustments, pt performed bicep curls x 20 reps each side with 2# dumbell and mini crunch with exhale on exertion while reaching forward with 1 kg weighted medicine ball x 10 reps to improve overall strength and endurance to increased functional mobility.   Therapy Documentation Precautions:   Precautions Precautions: Fall Precaution/Restrictions Comments: L nephrostomy tube, foley catheter Restrictions Weight Bearing Restrictions Per Provider Order: No    Pain: Reports pain in B knees - premedicated per report.     Therapy/Group: Individual Therapy  Calvin Schultz, PT, DPT, CBIS  07/14/2023, 1:39 PM

## 2023-07-14 NOTE — Progress Notes (Signed)
 Physical Therapy Session Note  Patient Details  Name: Calvin Schultz MRN: [id number] Date of Birth: 1950-05-18  Today's Date: 07/14/2023 PT Individual Time: 0800-0913 PT Individual Time Calculation (min): 73 min   Short Term Goals: Week 1:  PT Short Term Goal 1 (Week 1): Pt will complete bed mobility with modA consistently. PT Short Term Goal 2 (Week 1): Pt will complete sit to stand with maxA. PT Short Term Goal 3 (Week 1): Pt will complete bed to chair with maxA consistently. PT Short Term Goal 4 (Week 1): Pt will initiate gait training.  Skilled Therapeutic Interventions/Progress Updates:      Pt supine in bed upon arrival. Pt agreeable to therapy. Pt reports 10/10 B knee pain, nurse present to administer medications at start of session.   MD present for AM rounds. Therapist requesting CPM for improved knee flexion ROM. MD deemed inappropriate at this time. Therapist will continue to progress AROM, AAROM and PROM to improved independence with mobility. MD plans to adjust medications for improved pain tolerance with therapy.   Pt performed the following therex for B LE strength/ROM/contracture prevention:    1x10 B SLR, active assisted-pt able to initiate but required assist R LE > L LE for completion within available range  1x20 B ankle pumps -verbal cues provided for completion within available range  1x10 B quad sets holding for 5 seconds--pt demos limited activation 2/2 B LE edema  1x10 glute sets holding for 5 seconds  2x10 heel slides B first set passive, 2nd set active assisted with use of gait belt and therapist assist-empty end feel ~20 deg R knee, and L LE ~25 deg  Pt performed rolling to R with max A, and rolling to L with heavy mod A with use of bedrails verbal and tactile cues provided for initiation and sequencing/technique--donned pants with total A.   Pt performed supine to sit with HOB elevated to highest setting and max A for trunk and B LE 2/2 significant posterior  bias.  Pt performed reciprocal scoot with max A, verbal cues provided for technique/sequencing.   Pt performed slide board transfer bed to TIS with +2 max A, verbal and tactile cues provided for technique/sequencing with emphasis on anterior weight shift.   Pt demos improved posterior scoot into WC today, with B UE on arm rest, verbal cues provided for technique.   Elevated B LE on elevating leg rests for edema management. Recommended pt perform quad sets/glute sets and ankle pumps while seated in WC for improved strength/ROM/edema mangement/contracture prevention.   Pt seated in WC at end of session with all needs within reach and seatbelt alarm on.          Therapy Documentation Precautions:  Precautions Precautions: Fall Precaution/Restrictions Comments: L nephrostomy tube, foley catheter Restrictions Weight Bearing Restrictions Per Provider Order: No  Therapy/Group: Individual Therapy  [facility] Ambrose Finland, [place], DPT  07/14/2023, 7:35 AM

## 2023-07-15 DIAGNOSIS — R5381 Other malaise: Secondary | ICD-10-CM | POA: Diagnosis not present

## 2023-07-15 DIAGNOSIS — D72823 Leukemoid reaction: Secondary | ICD-10-CM | POA: Diagnosis not present

## 2023-07-15 DIAGNOSIS — N179 Acute kidney failure, unspecified: Secondary | ICD-10-CM | POA: Diagnosis not present

## 2023-07-15 DIAGNOSIS — A0472 Enterocolitis due to Clostridium difficile, not specified as recurrent: Secondary | ICD-10-CM | POA: Diagnosis not present

## 2023-07-15 LAB — BASIC METABOLIC PANEL
Anion gap: 7 (ref 5–15)
BUN: 53 mg/dL — ABNORMAL HIGH (ref 8–23)
CO2: 20 mmol/L — ABNORMAL LOW (ref 22–32)
Calcium: 8.1 mg/dL — ABNORMAL LOW (ref 8.9–10.3)
Chloride: 104 mmol/L (ref 98–111)
Creatinine, Ser: 4.75 mg/dL — ABNORMAL HIGH (ref 0.61–1.24)
GFR, Estimated: 12 mL/min — ABNORMAL LOW (ref >60)
Glucose, Bld: 91 mg/dL (ref 70–99)
Potassium: 3.7 mmol/L (ref 3.5–5.1)
Sodium: 131 mmol/L — ABNORMAL LOW (ref 135–145)

## 2023-07-15 LAB — CBC
HCT: 26.2 % — ABNORMAL LOW (ref 39.0–52.0)
Hemoglobin: 8.1 g/dL — ABNORMAL LOW (ref 13.0–17.0)
MCH: 27.4 pg (ref 26.0–34.0)
MCHC: 30.9 g/dL (ref 30.0–36.0)
MCV: 88.5 fL (ref 80.0–100.0)
Platelets: 443 10*3/uL — ABNORMAL HIGH (ref 150–400)
RBC: 2.96 MIL/uL — ABNORMAL LOW (ref 4.22–5.81)
RDW: 16.3 % — ABNORMAL HIGH (ref 11.5–15.5)
WBC: 15.8 10*3/uL — ABNORMAL HIGH (ref 4.0–10.5)
nRBC: 0 % (ref 0.0–0.2)

## 2023-07-15 LAB — GLUCOSE, CAPILLARY
Glucose-Capillary: 87 mg/dL (ref 70–99)
Glucose-Capillary: 119 mg/dL — ABNORMAL HIGH (ref 70–99)
Glucose-Capillary: 160 mg/dL — ABNORMAL HIGH (ref 70–99)
Glucose-Capillary: 105 mg/dL — ABNORMAL HIGH (ref 70–99)

## 2023-07-15 NOTE — Progress Notes (Signed)
 Occupational Therapy Session Note  Patient Details  Name: Calvin Schultz MRN: [id number] Date of Birth: [birth year]  Today's Date: 07/15/2023 OT Individual Time: 1005-1100 OT Individual Time Calculation (min): 55 min   Short Term Goals: Week 1:  OT Short Term Goal 1 (Week 1): The pt will transfer from supine in bed to chair LOF with ModA using a sliding board x2 at 95% safe. OT Short Term Goal 2 (Week 1): The pt will complete UB dressing with s/u A at 95% safe OT Short Term Goal 3 (Week 1): The pt will complete LB bathing with  MinA incorporating AE at 95% safe OT Short Term Goal 4 (Week 1): The pt will complete LB dressing with MinA incorporating AE at 95% safe OT Short Term Goal 5 (Week 1): The pt will tolerate > 45 minutes of OT activity at 95% safe.  Skilled Therapeutic Interventions/Progress Updates:   Pt greeted sitting in TIS WC for skilled OT session with focus on LUE gentle ROM and functional transfers/upright tolerance.   Pain: Pt reported 10/10 generalized pain, medicated during session. OT offering intermediate rest breaks and positioning suggestions throughout session to address pain/fatigue and maximize participation/safety in session.   Pt shares LUE pain/limited ROM stems for placement of dialysis port, subsequent weeping edema, and increased stiffness.   Pt dependently transported from room<>main therapy gym in TIS WC. Heat applied to L-shoulder in preparation for ROM exercises, tolerated well. Pt then instructed in series of table-top glides targeting: Shoulder and Elbow Flexion/Extension Shoulder Abduction/Adduction Circumduction (clockwise/counterclockwise)  Crepitus noted during most of the above, covering MD made aware.  Once +2 assistance available, appropriate sling retrieved for [employer] lift. Increased time/efforts required for x1 sit<>stand, pt tolerating stance for ~1 min, assistance provided to offload LUE. Minimal glutea activation noted to bring hips  anteriorly. All to address BLE strength deficits and standing tolerance for carryover into ADL participation and decrease caregiver burden.   Pt remained sitting in TIS WC, heat applied to B-knees for pain management, 4Ps assessed and immediate needs met. Pt continues to be appropriate for skilled OT intervention to promote further functional independence in ADLs/IADLs.   Therapy Documentation Precautions:  Precautions Precautions: Fall Precaution/Restrictions Comments: L nephrostomy tube, foley catheter Restrictions Weight Bearing Restrictions Per Provider Order: No   Therapy/Group: Individual Therapy  Lou Cal, OTR/L, MSOT  07/15/2023, 5:19 AM

## 2023-07-15 NOTE — Progress Notes (Signed)
 [facility] NEPHROLOGY PROGRESS NOTE  Assessment/ Plan: Pt is a 73 y.o. yo male    # AKI on CKD 4 - AKI is ischemic and pre-renal insults related to hypotension +/- ARB and intravasc vol depletion.  CT w/o obstruction, has L neph tube.  He was on CRRT from 2/22 - 2/25.  Note that Cr 4.06- 5.3 from jan 2025, eGFR 11-15 ml/min.  For reference in 01/2022 with Cr 2.48 from [facility] Cont holding HD and watching labs and UOP; creatinine is leveling trending down, increasing urine output. No vascular access currenlty, TDC if needed  Removed foley catheter, has PCN No Need for HD today.  # Metabolic acidosis -on sod bicarb. #Shock/ hypotension - resolved #L nephrostomy tube - placed in Jan 2025 by IR, and is f/b urology. Has staghorn calculi on the L and had previously been scheduled to have stone removed and stent placed per his wife on March 7th at [facility]. Postponed #Urinary retention - as above, per CIR #Chronic atrial fib - on eliquis; per primary team  #Hypophosphatemia secondary to CRRT - resolved #Hypokalemia -  resolved #Anemia normocytic: cont ESA; PRBC's per primary team  Subjective: No new event.  No new event.  Updated patient's wife who was presented with him.  No nausea, vomiting, chest pain or shortness of breath. Objective Vital signs in last 24 hours: Vitals:   07/14/23 1541 07/14/23 1949 07/15/23 0500 07/15/23 0542  BP: 127/63 135/77  116/60  Pulse: 78 70  (!) 55  Resp: 16 17  18   Temp: 97.7 F (36.5 C) 97.7 F (36.5 C)  97.9 F (36.6 C)  TempSrc: Axillary   Oral  SpO2: 100% 99%  97%  Weight:   102.6 kg   Height:       Weight change: -3 kg  Intake/Output Summary (Last 24 hours) at 07/15/2023 1138 Last data filed at 07/15/2023 0604 Gross per 24 hour  Intake 240 ml  Output 816 ml  Net -576 ml       Labs: RENAL PANEL Recent Labs  Lab 07/09/23 0513 07/10/23 0600 07/11/23 0627 07/12/23 0731 07/13/23 0635 07/14/23 0507  07/15/23 0504  NA 129* 131* 133* 130* 131* 131* 131*  K 4.4 4.4 4.6 4.8 4.1 4.1 3.7  CL 103 103 103 103 104 106 104  CO2 20* 21* 21* 16* 19* 19* 20*  GLUCOSE 99 109* 146* 120* 107* 105* 91  BUN 46* 52* 54* 57* 56* 54* 53*  CREATININE 4.98* 5.04* 5.08* 5.10* 5.16* 4.91* 4.75*  CALCIUM 7.6* 7.9* 8.1* 8.1* 8.0* 7.5* 8.1*  MG 1.9 1.9  --   --   --   --   --   PHOS 5.1* 5.4*  --   --   --   --   --   ALBUMIN 1.9* 1.9* 1.7*  --   --   --   --     Liver Function Tests: Recent Labs  Lab 07/09/23 0513 07/10/23 0600 07/11/23 0627  AST  --   --  11*  ALT  --   --  11  ALKPHOS  --   --  72  BILITOT  --   --  0.7  PROT  --   --  4.8*  ALBUMIN 1.9* 1.9* 1.7*   No results for input(s): "LIPASE", "AMYLASE" in the last 168 hours. No results for input(s): "AMMONIA" in the last 168 hours. CBC: Recent Labs    06/03/23 0647 06/04/23 0503 06/04/23 1126  06/05/23 5621 07/03/23 1514 07/04/23 0140 07/09/23 0513 07/11/23 0627 07/12/23 0731 07/13/23 0635 07/15/23 0504  HGB 10.2*   < >  --    < >  --    < > 7.7* 7.1* 8.8* 8.2* 8.1*  MCV 85.5   < >  --    < >  --    < > 90.2 90.1 89.3 89.3 88.5  FERRITIN  --   --  32  --  88  --   --   --   --   --   --   TIBC 384  --   --   --  179*  --   --   --   --   --   --   IRON 45  --   --   --  35*  --   --   --   --   --   --    < > = values in this interval not displayed.    Cardiac Enzymes: No results for input(s): "CKTOTAL", "CKMB", "CKMBINDEX", "TROPONINI" in the last 168 hours. CBG: Recent Labs  Lab 07/14/23 0613 07/14/23 1200 07/14/23 1614 07/14/23 2119 07/15/23 0614  GLUCAP 108* 142* 148* 115* 87    Iron Studies: No results for input(s): "IRON", "TIBC", "TRANSFERRIN", "FERRITIN" in the last 72 hours. Studies/Results: No results found.  Medications: Infusions:   Scheduled Medications:  apixaban  5 mg Oral BID   Chlorhexidine Gluconate Cloth  6 each Topical Q12H   darbepoetin (ARANESP) injection - NON-DIALYSIS  40 mcg  Subcutaneous Q Fri-1800   diclofenac Sodium  2 g Topical QID   insulin aspart  0-9 Units Subcutaneous TID WC   iron polysaccharides  150 mg Oral Daily   midodrine  10 mg Oral TID WC   multivitamin  1 tablet Oral QHS   traMADol  50 mg Oral BID    have reviewed scheduled and prn medications.  Physical Exam: General:NAD, comfortable Heart:RRR, s1s2 nl Lungs:clear b/l, no crackle Abdomen:soft, Non-tender, non-distended Extremities:No edema Dialysis Access: None.  Calvin Schultz 07/15/2023,11:38 AM  LOS: 5 days

## 2023-07-15 NOTE — Progress Notes (Signed)
 Orthopedic Tech Progress Note Patient Details:  Calvin Schultz [birth year] [id number]  Ortho Devices Type of Ortho Device: Knee Sleeve Ortho Device/Splint Location: BLE Ortho Device/Splint Interventions: Ordered, Other (comment) Delivered to front desk to charge RN patient is with PT at the time    Post Interventions Patient Tolerated: Well Instructions Provided: Care of device  Donald Pore 07/15/2023, 10:29 AM

## 2023-07-15 NOTE — Progress Notes (Signed)
 Physical Therapy Session Note  Patient Details  Name: Calvin Schultz MRN: [id number] Date of Birth: [birth year]  Today's Date: 07/15/2023 PT Individual Time: 0800-0858 PT Individual Time Calculation (min): 58 min   Short Term Goals: Week 1:  PT Short Term Goal 1 (Week 1): Pt will complete bed mobility with modA consistently. PT Short Term Goal 2 (Week 1): Pt will complete sit to stand with maxA. PT Short Term Goal 3 (Week 1): Pt will complete bed to chair with maxA consistently. PT Short Term Goal 4 (Week 1): Pt will initiate gait training.  Skilled Therapeutic Interventions/Progress Updates:      Pt supine in bed upon arrival. Pt agreeable to therapy. Pt reports 10/10 B knee pain, premedicated. Pt wife brought B knee brace from home, donned at start of session, pt and wife requesting knee brace be ordered as pt current knee braces are pt wife. Notified MD to request B knee brace for pain management.   No evidence of bowel/urinary incontinence, pt denies need to use bathroom. Pt performed rolling B with use of bed rails with HOH for L UE placement on bedrail and heavy mod-max A, pt demos improved recall of technique. Donned pants with max A for feeding LE-pt lifting legs with VC, and total A for donning over buttocks.   Supine to sit with use of bed features and mod A (to reduce friction for B LE, and assist with trunk, pt demos improved initiation/activation, with verbal cues provided for technique/psoitioning.   Seated balance with sup/intermittent min A for posterior bias. Pt performed reciprocal scoot with max A to get feet on floor  Slide board +2 max A, with therapist positioning pt legs into as much knee flexion ROM as tolerated, pt demos improved carry over of anterior weight shift throughout session.   Pt performed seated LAQ/knee flexion x15 as tolerated. Recommended pt perform these as well as ankle pumps, quad sets, and glute sets while in [facility] and not in therapy for improved  ROM/strength.  Pt assessed pt and caregiver recall of frequency technique in TIS for adequate pressure relief. Pt wife able to recall technique, however needed reminder for frequency. Recommended pt perform every 20-30 min, holding for 3 min. Pt and caregiver verbalized understanding.   Pt unable to find pool noodle for one of the leg rests, pt provided additional pool noodle for leg rest to reduce skin irriation/pressure injury.   Pt seated in TIS WC at end of session with all needs within reach and bed alarm on.   Therapy Documentation Precautions:  Precautions Precautions: Fall Precaution/Restrictions Comments: L nephrostomy tube, foley catheter Restrictions Weight Bearing Restrictions Per Provider Order: No  Therapy/Group: Individual Therapy  [facility] Ambrose Finland, [place], DPT  07/15/2023, 7:40 AM

## 2023-07-15 NOTE — Plan of Care (Signed)
  Problem: RH Balance Goal: LTG Patient will maintain dynamic standing with ADLs (OT) Description: LTG:  Patient will maintain dynamic standing balance with assist during activities of daily living (OT)  Flowsheets (Taken 07/15/2023 0515) LTG: Pt will maintain dynamic standing balance during ADLs with: Minimal Assistance - Patient > 75% Note: Downgraded 3/12 due to patient's current functioning.    Problem: Sit to Stand Goal: LTG:  Patient will perform sit to stand in prep for activites of daily living with assistance level (OT) Description: LTG:  Patient will perform sit to stand in prep for activites of daily living with assistance level (OT) Flowsheets (Taken 07/15/2023 0515) LTG: PT will perform sit to stand in prep for activites of daily living with assistance level: Minimal Assistance - Patient > 75% Note: Downgraded 3/12 due to patient's current functioning.    Problem: RH Bathing Goal: LTG Patient will bathe all body parts with assist levels (OT) Description: LTG: Patient will bathe all body parts with assist levels (OT) Flowsheets (Taken 07/15/2023 0515) LTG: Pt will perform bathing with assistance level/cueing: Minimal Assistance - Patient > 75% Note: Downgraded 3/12 due to patient's current functioning.    Problem: RH Dressing Goal: LTG Patient will perform lower body dressing w/assist (OT) Description: LTG: Patient will perform lower body dressing with assist, with/without cues in positioning using equipment (OT) Flowsheets (Taken 07/15/2023 0515) LTG: Pt will perform lower body dressing with assistance level of: Minimal Assistance - Patient > 75% Note: Downgraded 3/12 due to patient's current functioning.    Problem: RH Toileting Goal: LTG Patient will perform toileting task (3/3 steps) with assistance level (OT) Description: LTG: Patient will perform toileting task (3/3 steps) with assistance level (OT)  Flowsheets (Taken 07/15/2023 0515) LTG: Pt will perform toileting task  (3/3 steps) with assistance level: Minimal Assistance - Patient > 75% Note: Downgraded 3/12 due to patient's current functioning.    Problem: RH Tub/Shower Transfers Goal: LTG Patient will perform tub/shower transfers w/assist (OT) Description: LTG: Patient will perform tub/shower transfers with assist, with/without cues using equipment (OT) Flowsheets (Taken 07/15/2023 0515) LTG: Pt will perform tub/shower stall transfers with assistance level of: Minimal Assistance - Patient > 75% Note: Downgraded 3/12 due to patient's current functioning.

## 2023-07-15 NOTE — Progress Notes (Signed)
 Occupational Therapy Session Note  Patient Details  Name: Calvin Schultz MRN: [id number] Date of Birth: Sep 04, 1950  Today's Date: 07/15/2023 OT Individual Time: 1350-1430 OT Individual Time Calculation (min): 40 min    Short Term Goals: Week 1:  OT Short Term Goal 1 (Week 1): The pt will transfer from supine in bed to chair LOF with ModA using a sliding board x2 at 95% safe. OT Short Term Goal 2 (Week 1): The pt will complete UB dressing with s/u A at 95% safe OT Short Term Goal 3 (Week 1): The pt will complete LB bathing with  MinA incorporating AE at 95% safe OT Short Term Goal 4 (Week 1): The pt will complete LB dressing with MinA incorporating AE at 95% safe OT Short Term Goal 5 (Week 1): The pt will tolerate > 45 minutes of OT activity at 95% safe.  Skilled Therapeutic Interventions/Progress Updates:    Pt received supine with no c/o pain, agreeable to OT session but reporting he is "wiped out". Assessed L shoulder limitations, pt reporting sudden decrease in AROM with edema from HD port being the only change in this limb he can remember, his wife endorses. He is able to be passively ranged to 100 degrees without pain. Actively he is able to demonstrate about 15 degrees of shoulder flexion and abduction with compensatory trap elevation being used. (+) Drop arm test. Pt transitioned into R sidelying with min A and pillows placed to support him in this position. He completed gravity eliminated shoulder flexion and was able to reach 40 degrees in this plane. He did report pain with >30 degrees. Pt had to be brought into sitting x3 for him to spit up thick, blood tinged sputum. He then transitioned into supine and completed RUE 2x8 weighted shoulder raise (6lb), with mod A, as well as un-weighted AAROM LUE. He ended with 2x10 repetitions single leg raise with cueing for core activation and stabilization. Pt left supine with all needs met, bed alarm set. Wife present.   Notified PA of shoulder  limitations as no documentation of this, as well as the blood tinged sputum coughed up during session.   Therapy Documentation Precautions:  Precautions Precautions: Fall Precaution/Restrictions Comments: L nephrostomy tube, foley catheter Restrictions Weight Bearing Restrictions Per Provider Order: No  Therapy/Group: Individual Therapy  Crissie Reese 07/15/2023, 6:27 AM

## 2023-07-15 NOTE — Progress Notes (Signed)
 Patient ID: Calvin Schultz, male   DOB: 12-27-1950, 73 y.o.   MRN: [id number]  Met with pt and wife who is present to give them the team conference update regarding goals of min assist and target discharge date of 3/28. Will need to make sure wife can provide the care he will require and she is here daily and will start hands on once team feels appropriate. Pt is trying but his knees and shoulder limit him. Aware stent to be placed 3/21. Both glad can be done while here. Continue to work on discharge needs.

## 2023-07-15 NOTE — Progress Notes (Signed)
 Physical Therapy Session Note  Patient Details  Name: Calvin Schultz MRN: [id number] Date of Birth: 1950/10/19  Today's Date: 07/15/2023 PT Individual Time: 8413-2440 PT Individual Time Calculation (min): 41 min   Short Term Goals: Week 1:  PT Short Term Goal 1 (Week 1): Pt will complete bed mobility with modA consistently. PT Short Term Goal 2 (Week 1): Pt will complete sit to stand with maxA. PT Short Term Goal 3 (Week 1): Pt will complete bed to chair with maxA consistently. PT Short Term Goal 4 (Week 1): Pt will initiate gait training.  Skilled Therapeutic Interventions/Progress Updates: Patient sitting in TIS with wife present on entrance to room. Patient alert and agreeable to PT session.   Patient reported unrated B knee pain (B heat packs donned underneath wraps on knees). PTA informed pt of today's goal to stand in standing frame to increase WB tolerance in B LE's. Pt agreeable to participate. Pt transported to day room gym. PTA adjusted strap underneath pt's hips in TIS but ultimately required + 2 to assist with donning strap as PTA stood in front of pt providing VC to anteriorly lean, and to power through LE's to slightly lift hips off of seat cushion (heavy maxA). Pt unable to get adequate knee flexion (close to 90*) in order to place knees on padding without increasing pain. Several attempts performed but ultimately deferred due to pt's presentation. Pt required maxA to scoot in TIS during session. Pt transported back to room and performed slide board transfer maxA + 2 with VC for technique of head/hip relationship and safe hand placement. Pt maxA + 2 to advance B LE's onto bed with VC to lean on L elbow.   Patient supine in bed at end of session with brakes locked, nsg present to perform in-out cath, wife present, and all needs within reach.      Therapy Documentation Precautions:  Precautions Precautions: Fall Precaution/Restrictions Comments: L nephrostomy tube, foley  catheter Restrictions Weight Bearing Restrictions Per Provider Order: No  Therapy/Group: Individual Therapy  Jennings Stirling PTA 07/15/2023, 12:08 PM

## 2023-07-15 NOTE — Progress Notes (Signed)
 Occupational Therapy Session Note  Patient Details  Name: Calvin Schultz MRN: [id number] Date of Birth: 07/30/1950  {CHL IP REHAB OT TIME CALCULATIONS:[id number]}   Short Term Goals: Week 1:  OT Short Term Goal 1 (Week 1): The pt will transfer from supine in bed to chair LOF with ModA using a sliding board x2 at 95% safe. OT Short Term Goal 2 (Week 1): The pt will complete UB dressing with s/u A at 95% safe OT Short Term Goal 3 (Week 1): The pt will complete LB bathing with  MinA incorporating AE at 95% safe OT Short Term Goal 4 (Week 1): The pt will complete LB dressing with MinA incorporating AE at 95% safe OT Short Term Goal 5 (Week 1): The pt will tolerate > 45 minutes of OT activity at 95% safe.  Skilled Therapeutic Interventions/Progress Updates:    Patient agreeable to participate in OT session. Reports *** pain level.   Patient participated in skilled OT session focusing on ***. Therapist facilitated/assessed/developed/educated/integrated/elicited *** in order to improve/facilitate/promote    Therapy Documentation Precautions:  Precautions Precautions: Fall Precaution/Restrictions Comments: L nephrostomy tube, foley catheter Restrictions Weight Bearing Restrictions Per Provider Order: No   Therapy/Group: Individual Therapy  Limmie Patricia, OTR/L,CBIS  Supplemental OT - [facility] and [facility] Secure Chat Preferred   07/15/2023, 9:21 PM

## 2023-07-15 NOTE — Progress Notes (Signed)
 PROGRESS NOTE   Subjective/Complaints: Pt with ongoing knee pain. Tramadol helps somewhat to take edge off, but it takes a bit for it to help. Wife brought in Knee sleeves but they were hers.   ROS: Patient denies fever, rash, sore throat, blurred vision, dizziness, nausea, vomiting, diarrhea, cough, shortness of breath or chest pain,  headache, or mood change.    Objective:   No results found.  Recent Labs    07/13/23 0635 07/15/23 0504  WBC 17.1* 15.8*  HGB 8.2* 8.1*  HCT 26.6* 26.2*  PLT 403* 443*   Recent Labs    07/14/23 0507 07/15/23 0504  NA 131* 131*  K 4.1 3.7  CL 106 104  CO2 19* 20*  GLUCOSE 105* 91  BUN 54* 53*  CREATININE 4.91* 4.75*  CALCIUM 7.5* 8.1*    Intake/Output Summary (Last 24 hours) at 07/15/2023 0825 Last data filed at 07/15/2023 1610 Gross per 24 hour  Intake 240 ml  Output 1266 ml  Net -1026 ml     Pressure Injury 07/10/23 Buttocks Right;Mid;Upper Stage 2 -  Partial thickness loss of dermis presenting as a shallow open injury with a red, pink wound bed without slough. Pink opened stage 2 right upper buttock (Active)  07/10/23 1608  Location: Buttocks  Location Orientation: Right;Mid;Upper  Staging: Stage 2 -  Partial thickness loss of dermis presenting as a shallow open injury with a red, pink wound bed without slough.  Wound Description (Comments): Pink opened stage 2 right upper buttock  Present on Admission: Yes     Pressure Injury 07/10/23 Buttocks Right;Mid;Upper Stage 2 -  Partial thickness loss of dermis presenting as a shallow open injury with a red, pink wound bed without slough. Pink stage 2 directly beside the right upper stage 2 (Active)  07/10/23 1609  Location: Buttocks  Location Orientation: Right;Mid;Upper  Staging: Stage 2 -  Partial thickness loss of dermis presenting as a shallow open injury with a red, pink wound bed without slough.  Wound Description  (Comments): Pink stage 2 directly beside the right upper stage 2  Present on Admission: Yes     Pressure Injury 07/10/23 Buttocks Right;Mid;Lower Stage 2 -  Partial thickness loss of dermis presenting as a shallow open injury with a red, pink wound bed without slough. Pink stage 2 to right buttock below the upper stage 2 (Active)  07/10/23 1610  Location: Buttocks  Location Orientation: Right;Mid;Lower  Staging: Stage 2 -  Partial thickness loss of dermis presenting as a shallow open injury with a red, pink wound bed without slough.  Wound Description (Comments): Pink stage 2 to right buttock below the upper stage 2  Present on Admission: Yes     Pressure Injury 07/10/23 Buttocks Left;Mid;Lower Stage 2 -  Partial thickness loss of dermis presenting as a shallow open injury with a red, pink wound bed without slough. Pink stage 2 to left buttock (Active)  07/10/23 1611  Location: Buttocks  Location Orientation: Left;Mid;Lower  Staging: Stage 2 -  Partial thickness loss of dermis presenting as a shallow open injury with a red, pink wound bed without slough.  Wound Description (Comments): Pink stage 2 to left buttock  Present on Admission: Yes    Physical Exam: Vital Signs Blood pressure 116/60, pulse (!) 55, temperature 97.9 F (36.6 C), temperature source Oral, resp. rate 18, height 5\' 9"  (1.753 m), weight 102.6 kg, SpO2 97%.    Constitutional: No distress . Vital signs reviewed. HEENT: NCAT, EOMI, oral membranes moist Neck: supple Cardiovascular: RRR without murmur. No JVD    Respiratory/Chest: CTA Bilaterally without wheezes or rales. Normal effort    GI/Abdomen: BS +, non-tender, non-distended Ext: no clubbing, cyanosis, or edema Psych: flat but generally pleasant and cooperative  Neurological: HOH- alert Skin: C/D/I. No apparent lesions.  Tube sites appear clean/dry/intact.  Mepilex on left shoulder. Left drain in place/flank with clear yellow fluid -Buttocks MASD--not  visualized today    MSK:     Both knees with effusions/chronic changes. Right knee more pronounced than left. No changes today       Neurologic exam:  Cognition: AAO to person, place, time and event.  Mood: Pleasant affect, appropriate mood.  Sensation: To light touch intact in BL UEs and LEs  Reflexes: 2+ in BL UE and LEs. Negative Hoffman's and babinski signs bilaterally.  CN: 2-12 grossly intact.       Strength:                RUE: 5/5 SA, 5/5 EF, 5/5 EE, 5/5 WE, 5/5 FF, 5/5 FA                LUE:  5/5 SA, 5/5 EF, 5/5 EE, 5/5 WE, 5/5 FF, 5/5 FA                RLE: 4/5 HF, 4/5 KE, 5/5  DF, 5/5  EHL, 5/5  PF                 LLE:  3/5 HF, 4-/5 KE, 5/5  DF, 5/5  EHL, 5/5  PF       Assessment/Plan: 1. Functional deficits which require 3+ hours per day of interdisciplinary therapy in a comprehensive inpatient rehab setting. Physiatrist is providing close team supervision and 24 hour management of active medical problems listed below. Physiatrist and rehab team continue to assess barriers to discharge/monitor patient progress toward functional and medical goals  Care Tool:  Bathing  Bathing activity did not occur:  (patient completed a simulated task at bed LOF) Body parts bathed by patient: Right arm, Left arm, Chest, Abdomen, Front perineal area, Right upper leg, Left upper leg, Face   Body parts bathed by helper: Buttocks, Left lower leg, Right lower leg     Bathing assist Assist Level: Maximal Assistance - Patient 24 - 49%     Upper Body Dressing/Undressing Upper body dressing   What is the patient wearing?: Pull over shirt    Upper body assist Assist Level: Minimal Assistance - Patient > 75%    Lower Body Dressing/Undressing Lower body dressing      What is the patient wearing?: Pants, Underwear/pull up, Incontinence brief     Lower body assist Assist for lower body dressing: Maximal Assistance - Patient 25 - 49%     Toileting Toileting    Toileting assist  Assist for toileting: Dependent - Patient 0%     Transfers Chair/bed transfer  Transfers assist  Chair/bed transfer activity did not occur: Safety/medical concerns (not safe to get up)  Chair/bed transfer assist level: 2 Helpers     Locomotion Ambulation   Ambulation assist   Ambulation activity did not occur: Safety/medical concerns  Walk 10 feet activity   Assist  Walk 10 feet activity did not occur: Safety/medical concerns        Walk 50 feet activity   Assist Walk 50 feet with 2 turns activity did not occur: Safety/medical concerns         Walk 150 feet activity   Assist Walk 150 feet activity did not occur: Safety/medical concerns         Walk 10 feet on uneven surface  activity   Assist Walk 10 feet on uneven surfaces activity did not occur: Safety/medical concerns         Wheelchair     Assist Is the patient using a wheelchair?: Yes Type of Wheelchair: Manual    Wheelchair assist level: Dependent - Patient 0% Max wheelchair distance: 150'    Wheelchair 50 feet with 2 turns activity    Assist        Assist Level: Dependent - Patient 0%   Wheelchair 150 feet activity     Assist      Assist Level: Dependent - Patient 0%   Blood pressure 116/60, pulse (!) 55, temperature 97.9 F (36.6 C), temperature source Oral, resp. rate 18, height 5\' 9"  (1.753 m), weight 102.6 kg, SpO2 97%.   Medical Problem List and Plan: 1. Functional deficits secondary to debility related to septic shock/C. difficile colitis/AKI superimposed on CKD stage IV             -patient may shower with tubes/drains covered             -ELOS/Goals: 18 to 21 days, min assist PT/OT            -Continue CIR therapies including PT and OT. Interdisciplinary team conference today to discuss goals, barriers to discharge, and dc planning.   2.  Antithrombotics: -DVT/anticoagulation:  Pharmaceutical: Eliquis             -antiplatelet therapy:  N/A   3. Pain Management: tramadol prn, Zanaflex 4 mg twice daily as needed   Biliateral OA of knees  -wife will try to bring in neoprine knee sleeves from home  -voltaren gel tid to knees  -have scheduled tramadol 50mg  at 0700 and 1200 daily. Continue prn as well 4. Mood/Behavior/Sleep: Trazodone 50 mg nightly as needed.  Provide emotional support             -antipsychotic agents: N/A   5. Neuropsych/cognition: This patient is capable of making decisions on his own behalf.   6. Skin/Wound Care: Routine skin checks   7. Fluids/Electrolytes/Nutrition: Routine in and outs with follow-up chemistries   8.  Oliguric AKI superimposed on CKD stage IV.  Baseline creatinine 4.9.  CRRT discontinued 2/25.  No current plan for long-term dialysis.  Follow-up renal services              - Per discussion with nephrology Dr. Verna Czech, keep Foley catheter in to allow accurate I's and O's through Monday; then, can remove and do DC Foley trial while continuing to document strict outputs             - Continue daily assessments of renal function per nephrology   3/8- per renal, Cr leveling out- and con't Foley- will remove Monday  3/12 continue voiding trial. -strict I's and O's    -had one PVR of 300. Remainder of output through nephrostomy  -nephrology following 9.  C. difficile colitis.  Completed course of oral vancomycin 3/7.  DC'd IV Flagyl 3/3. Enteric  precautions   3/9- spoke with pahrmacy- pt has adequate tx for Cdiff and LBM yesterday- is improved 10.  Status post left nephrostomy tube d/t staghorn calculi. Has Foley Nephrostomy tube placed 1/29 per IR with plan for ureteric stent placement 3/21              - Careful monitoring of nephrostomy output 11.  Chronic atrial fibrillation with episodes of bradycardia.  Followed by cardiology services.  Continue Eliquis.  Avoid AV nodal blocking agents   12.  Diabetes mellitus.  Hemoglobin A1c 6.2.  SSI  3/8-3/10 Cbgs under reasonable control.  Continue with current regimen      Recent Labs    07/10/23 0801 07/10/23 1154 07/10/23 1635  GLUCAP 111* 165* 155*     13.  Hypotension.  ProAmatine 10 mg 3 times daily.  Monitor with increased mobility   - Normotensive on admission  3/9- BP running 100-s to 120s systolic- con't regimen 14.  Chronic anemia.  Niferex daily/Aranesp.  Monitor for any bleeding episodes   3/8- Hb 7.1 today- will order transfusion- transfusion of 1 unit pRBCs.   3/12 Hb  8.1    16. Leukocytosis  3/9- Pt's WBC is 21.7- up from 16k- is afebrile- due to his recent C diff, that just finished correct dosing of ABX for, called ID for guidance on treatment- don't want to cause more Cdiff- ID consulted ---no new orders  3/10 WBC's down to 17k  --have been hovering above and below 20 for awhile. No new clinical symptoms. Nephrostomy drainage clear, yellow  3/12 wbc's down to 15.8k   -no clinical signs of infection    LOS: 5 days A FACE TO FACE EVALUATION WAS PERFORMED  Ranelle Oyster 07/15/2023, 8:25 AM

## 2023-07-16 ENCOUNTER — Telehealth: Payer: Self-pay | Admitting: Physical Medicine & Rehabilitation

## 2023-07-16 DIAGNOSIS — R5381 Other malaise: Secondary | ICD-10-CM | POA: Diagnosis not present

## 2023-07-16 DIAGNOSIS — D72823 Leukemoid reaction: Secondary | ICD-10-CM | POA: Diagnosis not present

## 2023-07-16 DIAGNOSIS — A0472 Enterocolitis due to Clostridium difficile, not specified as recurrent: Secondary | ICD-10-CM | POA: Diagnosis not present

## 2023-07-16 DIAGNOSIS — N179 Acute kidney failure, unspecified: Secondary | ICD-10-CM | POA: Diagnosis not present

## 2023-07-16 LAB — GLUCOSE, CAPILLARY
Glucose-Capillary: 66 mg/dL — ABNORMAL LOW (ref 70–99)
Glucose-Capillary: 100 mg/dL — ABNORMAL HIGH (ref 70–99)

## 2023-07-16 LAB — BASIC METABOLIC PANEL
Anion gap: 9 (ref 5–15)
BUN: 49 mg/dL — ABNORMAL HIGH (ref 8–23)
CO2: 18 mmol/L — ABNORMAL LOW (ref 22–32)
Calcium: 8.2 mg/dL — ABNORMAL LOW (ref 8.9–10.3)
Chloride: 107 mmol/L (ref 98–111)
Creatinine, Ser: 4.27 mg/dL — ABNORMAL HIGH (ref 0.61–1.24)
GFR, Estimated: 14 mL/min — ABNORMAL LOW (ref >60)
Glucose, Bld: 80 mg/dL (ref 70–99)
Potassium: 4.2 mmol/L (ref 3.5–5.1)
Sodium: 134 mmol/L — ABNORMAL LOW (ref 135–145)

## 2023-07-16 MED ORDER — LIDOCAINE HCL URETHRAL/MUCOSAL 2 % EX GEL
CUTANEOUS | Status: DC | PRN
  Administered 2023-07-16 – 2023-07-28 (×8): 6 via TOPICAL
  Administered 2023-07-28: 1 via TOPICAL
  Administered 2023-07-31: 6 via TOPICAL
  Filled 2023-07-16 (×26): qty 6

## 2023-07-16 MED ORDER — TROLAMINE SALICYLATE 10 % EX CREA: TOPICAL_CREAM | Freq: Three times a day (TID) | CUTANEOUS | Status: DC

## 2023-07-16 MED ORDER — MUSCLE RUB 10-15 % EX CREA
TOPICAL_CREAM | Freq: Three times a day (TID) | CUTANEOUS | Status: DC
  Administered 2023-07-21 – 2023-08-19 (×2): 1 via TOPICAL
  Filled 2023-07-16 (×2): qty 85

## 2023-07-16 MED ORDER — TAMSULOSIN HCL 0.4 MG PO CAPS
0.4000 mg | ORAL_CAPSULE | Freq: Every day | ORAL | Status: DC
  Administered 2023-07-16 – 2023-07-27 (×12): 0.4 mg via ORAL
  Filled 2023-07-16 (×12): qty 1

## 2023-07-16 NOTE — Telephone Encounter (Signed)
 Dr. Mena Goes with Urology needs to speak to you about patient, please call his cell phone [phone].

## 2023-07-16 NOTE — Progress Notes (Signed)
 PROGRESS NOTE   Subjective/Complaints: Pt received knee sleeves yesterday. Has not worn yet. Tramadol scheduled is helping. OT mentioned left shoulder pain. Pt/wife report pain and stiffness in left shoulder since being in the hospital. He has no history of shoulder injury or pain PTA  ROS: Patient denies fever, rash, sore throat, blurred vision, dizziness, nausea, vomiting, diarrhea, cough, shortness of breath or chest pain,   headache, or mood change.    Objective:   No results found.  Recent Labs    07/15/23 0504  WBC 15.8*  HGB 8.1*  HCT 26.2*  PLT 443*   Recent Labs    07/15/23 0504 07/16/23 0606  NA 131* 134*  K 3.7 4.2  CL 104 107  CO2 20* 18*  GLUCOSE 91 80  BUN 53* 49*  CREATININE 4.75* 4.27*  CALCIUM 8.1* 8.2*    Intake/Output Summary (Last 24 hours) at 07/16/2023 1610 Last data filed at 07/16/2023 0600 Gross per 24 hour  Intake 480 ml  Output 2950 ml  Net -2470 ml     Pressure Injury 07/10/23 Buttocks Right;Mid;Upper Stage 2 -  Partial thickness loss of dermis presenting as a shallow open injury with a red, pink wound bed without slough. Pink opened stage 2 right upper buttock (Active)  07/10/23 1608  Location: Buttocks  Location Orientation: Right;Mid;Upper  Staging: Stage 2 -  Partial thickness loss of dermis presenting as a shallow open injury with a red, pink wound bed without slough.  Wound Description (Comments): Pink opened stage 2 right upper buttock  Present on Admission: Yes     Pressure Injury 07/10/23 Buttocks Right;Mid;Upper Stage 2 -  Partial thickness loss of dermis presenting as a shallow open injury with a red, pink wound bed without slough. Pink stage 2 directly beside the right upper stage 2 (Active)  07/10/23 1609  Location: Buttocks  Location Orientation: Right;Mid;Upper  Staging: Stage 2 -  Partial thickness loss of dermis presenting as a shallow open injury with a red, pink  wound bed without slough.  Wound Description (Comments): Pink stage 2 directly beside the right upper stage 2  Present on Admission: Yes     Pressure Injury 07/10/23 Buttocks Right;Mid;Lower Stage 2 -  Partial thickness loss of dermis presenting as a shallow open injury with a red, pink wound bed without slough. Pink stage 2 to right buttock below the upper stage 2 (Active)  07/10/23 1610  Location: Buttocks  Location Orientation: Right;Mid;Lower  Staging: Stage 2 -  Partial thickness loss of dermis presenting as a shallow open injury with a red, pink wound bed without slough.  Wound Description (Comments): Pink stage 2 to right buttock below the upper stage 2  Present on Admission: Yes     Pressure Injury 07/10/23 Buttocks Left;Mid;Lower Stage 2 -  Partial thickness loss of dermis presenting as a shallow open injury with a red, pink wound bed without slough. Pink stage 2 to left buttock (Active)  07/10/23 1611  Location: Buttocks  Location Orientation: Left;Mid;Lower  Staging: Stage 2 -  Partial thickness loss of dermis presenting as a shallow open injury with a red, pink wound bed without slough.  Wound Description (Comments): Pink  stage 2 to left buttock  Present on Admission: Yes    Physical Exam: Vital Signs Blood pressure 137/75, pulse 70, temperature 98 F (36.7 C), resp. rate 16, height 5\' 9"  (1.753 m), weight 102.8 kg, SpO2 100%.    Constitutional: No distress . Vital signs reviewed. HEENT: NCAT, EOMI, oral membranes moist Neck: supple Cardiovascular: RRR without murmur. No JVD    Respiratory/Chest: CTA Bilaterally without wheezes or rales. Normal effort    GI/Abdomen: BS +, non-tender, non-distended Ext: no clubbing, cyanosis, or edema Psych: pleasant and cooperative  Neurological: HOH- alert Skin: C/D/I. No apparent lesions.  Tube sites appear clean/dry/intact.  Mepilex on left shoulder. Left drain in place/flank with clear yellow fluid -Buttocks MASD--not visualized  today    MSK:    Both knees with effusions, tight. Left shoulder tight in ER/IR/F/E  some pain with IR especially when at 45 degrees or more       Neurologic exam:  Alert and oriented x 3. Normal insight and awareness. Intact Memory. Normal language and speech. Cranial nerve exam unremarkable.  .   Reflexes: 2+ in BL UE and LEs.    CN: 2-12 grossly intact.       Strength:                RUE: 5/5 SA, 5/5 EF, 5/5 EE, 5/5 WE, 5/5 FF, 5/5 FA                LUE:  5/5 SA, 5/5 EF, 5/5 EE, 5/5 WE, 5/5 FF, 5/5 FA                RLE: 4/5 HF, 4/5 KE, 5/5  DF, 5/5  EHL, 5/5  PF                 LLE:  3/5 HF, 4-/5 KE, 5/5  DF, 5/5  EHL, 5/5  PF   Sensory exam normal for light touch and pain in all 4 limbs. No limb ataxia or cerebellar signs. No abnormal tone appreciated.      Assessment/Plan: 1. Functional deficits which require 3+ hours per day of interdisciplinary therapy in a comprehensive inpatient rehab setting. Physiatrist is providing close team supervision and 24 hour management of active medical problems listed below. Physiatrist and rehab team continue to assess barriers to discharge/monitor patient progress toward functional and medical goals  Care Tool:  Bathing  Bathing activity did not occur:  (patient completed a simulated task at bed LOF) Body parts bathed by patient: Right arm, Left arm, Chest, Abdomen, Front perineal area, Right upper leg, Left upper leg, Face   Body parts bathed by helper: Buttocks, Left lower leg, Right lower leg     Bathing assist Assist Level: Maximal Assistance - Patient 24 - 49%     Upper Body Dressing/Undressing Upper body dressing   What is the patient wearing?: Pull over shirt    Upper body assist Assist Level: Minimal Assistance - Patient > 75%    Lower Body Dressing/Undressing Lower body dressing      What is the patient wearing?: Pants, Underwear/pull up, Incontinence brief     Lower body assist Assist for lower body dressing: Maximal  Assistance - Patient 25 - 49%     Toileting Toileting    Toileting assist Assist for toileting: Dependent - Patient 0%     Transfers Chair/bed transfer  Transfers assist  Chair/bed transfer activity did not occur: Safety/medical concerns (not safe to get up)  Chair/bed transfer assist  level: 2 Helpers     Locomotion Ambulation   Ambulation assist   Ambulation activity did not occur: Safety/medical concerns          Walk 10 feet activity   Assist  Walk 10 feet activity did not occur: Safety/medical concerns        Walk 50 feet activity   Assist Walk 50 feet with 2 turns activity did not occur: Safety/medical concerns         Walk 150 feet activity   Assist Walk 150 feet activity did not occur: Safety/medical concerns         Walk 10 feet on uneven surface  activity   Assist Walk 10 feet on uneven surfaces activity did not occur: Safety/medical concerns         Wheelchair     Assist Is the patient using a wheelchair?: Yes Type of Wheelchair: Manual    Wheelchair assist level: Dependent - Patient 0% Max wheelchair distance: 150'    Wheelchair 50 feet with 2 turns activity    Assist        Assist Level: Dependent - Patient 0%   Wheelchair 150 feet activity     Assist      Assist Level: Dependent - Patient 0%   Blood pressure 137/75, pulse 70, temperature 98 F (36.7 C), resp. rate 16, height 5\' 9"  (1.753 m), weight 102.8 kg, SpO2 100%.   Medical Problem List and Plan: 1. Functional deficits secondary to debility related to septic shock/C. difficile colitis/AKI superimposed on CKD stage IV             -patient may shower with tubes/drains covered             -ELOS/Goals:  3/28, min assist PT/OT           -Continue CIR therapies including PT, OT   2.  Antithrombotics: -DVT/anticoagulation:  Pharmaceutical: Eliquis             -antiplatelet therapy: N/A   3. Pain Management: tramadol prn, Zanaflex 4 mg twice  daily as needed   Biliateral OA of knees  -ordered bilateral neoprine knee sleeves --try today  -voltaren gel tid to knees  -have scheduled tramadol 50mg  at 0700 and 1200 daily. Continue prn as well  Mild adhesive capsulitis left shoulder---will add sports cream. Can use voltaren gel also. Begin aggressive ROM with therapy 4. Mood/Behavior/Sleep: Trazodone 50 mg nightly as needed.  Provide emotional support             -antipsychotic agents: N/A   5. Neuropsych/cognition: This patient is capable of making decisions on his own behalf.   6. Skin/Wound Care: Routine skin checks   7. Fluids/Electrolytes/Nutrition: Routine in and outs with follow-up chemistries   8.  Oliguric AKI superimposed on CKD stage IV.  Baseline creatinine 4.9.  CRRT discontinued 2/25.  No current plan for long-term dialysis.  Follow-up renal services              - Per discussion with nephrology Dr. Verna Czech, keep Foley catheter in to allow accurate I's and O's through Monday; then, can remove and do DC Foley trial while continuing to document strict outputs             - Continue daily assessments of renal function per nephrology   3/8- per renal, Cr leveling out- and con't Foley- will remove Monday  -nephrology following, Cr remains in 4's.   3/13 still retaining. Will add flomax, continue caths prn  9.  C. difficile colitis.  Completed course of oral vancomycin 3/7.  DC'd IV Flagyl 3/3. Enteric precautions   3/9- spoke with pahrmacy- pt has adequate tx for Cdiff and LBM yesterday- is improved 10.  Status post left nephrostomy tube d/t staghorn calculi. Has Foley Nephrostomy tube placed 1/29 per IR with plan for ureteric stent placement 3/21              - Careful monitoring of nephrostomy output 11.  Chronic atrial fibrillation with episodes of bradycardia.  Followed by cardiology services.  Continue Eliquis.  Avoid AV nodal blocking agents   12.  Diabetes mellitus.  Hemoglobin A1c 6.2.    -tightly controlled. Dc  SSI and change cbg checks to qam only   CBG (last 3)  Recent Labs    07/15/23 2104 07/16/23 0603 07/16/23 0635  GLUCAP 105* 66* 100*       13.  Hypotension.  ProAmatine 10 mg 3 times daily.  Monitor with increased mobility   - Normotensive on admission  3/9- BP running 100-s to 120s systolic- con't regimen 14.  Chronic anemia.  Niferex daily/Aranesp.  Monitor for any bleeding episodes   3/8- Hb 7.1 today- will order transfusion- transfusion of 1 unit pRBCs.   3/12 Hb  8.1    16. Leukocytosis  3/9- Pt's WBC is 21.7- up from 16k- is afebrile- due to his recent C diff, that just finished correct dosing of ABX for, called ID for guidance on treatment- don't want to cause more Cdiff- ID consulted ---no new orders  3/10 WBC's down to 17k  --have been hovering above and below 20 for awhile. No new clinical symptoms. Nephrostomy drainage clear, yellow  3/12 wbc's down to 15.8k  3/13 recheck cbc tomorrow    LOS: 6 days A FACE TO FACE EVALUATION WAS PERFORMED  Ranelle Oyster 07/16/2023, 9:23 AM

## 2023-07-16 NOTE — Patient Instructions (Addendum)
 SURGICAL WAITING ROOM VISITATION  Patients having surgery or a procedure may have no more than 2 support people in the waiting area - these visitors may rotate.    Children under the age of 38 must have an adult with them who is not the patient.  Due to an increase in RSV and influenza rates and associated hospitalizations, children ages 72 and under may not visit patients in [facility] hospitals.  Visitors with respiratory illnesses are discouraged from visiting and should remain at home.  If the patient needs to stay at the hospital during part of their recovery, the visitor guidelines for inpatient rooms apply. Pre-op nurse will coordinate an appropriate time for 1 support person to accompany patient in pre-op.  This support person may not rotate.    Please refer to the [facility] website for the visitor guidelines for Inpatients (after your surgery is over and you are in a regular room).       Your procedure is scheduled on: 07-24-23   Report to [facility] Main Entrance    Report to admitting at       1155 AM   Call this number if you have problems the morning of surgery [phone]   Do not eat food :After Midnight.   After Midnight you may have the following liquids until __0800___ AM/ DAY OF SURGERY    then nothing by mouth  Water Non-Citrus Juices (without pulp, NO RED-Apple, White grape, White cranberry) Black Coffee (NO MILK/CREAM OR CREAMERS, sugar ok)  Clear Tea (NO MILK/CREAM OR CREAMERS, sugar ok) regular and decaf                             Plain Jell-O (NO RED)                                           Fruit ices (not with fruit pulp, NO RED)                                     Popsicles (NO RED)                                                               Sports drinks like Gatorade (NO RED)                          If you have questions, please contact your surgeon's office.   FOLLOW  ANY ADDITIONAL PRE OP INSTRUCTIONS YOU RECEIVED FROM YOUR  SURGEON'S OFFICE!!!     Oral Hygiene is also important to reduce your risk of infection.                                    Remember - BRUSH YOUR TEETH THE MORNING OF SURGERY WITH YOUR REGULAR TOOTHPASTE  DENTURES WILL BE REMOVED PRIOR TO SURGERY PLEASE DO NOT APPLY "Poly grip" OR ADHESIVES!!!   Do NOT smoke after Midnight  Stop all vitamins and herbal supplements 7 days before surgery.   Take these medicines the morning of surgery with A SIP OF WATER: midodrine  DO NOT TAKE ANY ORAL DIABETIC MEDICATIONS DAY OF YOUR SURGERY  Bring CPAP mask and tubing day of surgery.                              You may not have any metal on your body including hair pins, jewelry, and body piercing             Do not wear lotions, powders, /cologne, or deodorant               Men may shave face and neck.   Do not bring valuables to the hospital. [place] IS NOT             RESPONSIBLE   FOR VALUABLES.   Contacts, glasses, dentures or bridgework may not be worn into surgery.   Bring small overnight bag day of surgery.   DO NOT BRING YOUR HOME MEDICATIONS TO THE HOSPITAL. PHARMACY WILL DISPENSE MEDICATIONS LISTED ON YOUR MEDICATION LIST TO YOU DURING YOUR ADMISSION IN THE HOSPITAL!    Patients discharged on the day of surgery will not be allowed to drive home.  Someone NEEDS to stay with you for the first 24 hours after anesthesia.   Special Instructions: Bring a copy of your healthcare power of attorney and living will documents the day of surgery if you haven't scanned them before.              Please read over the following fact sheets you were given: IF YOU HAVE QUESTIONS ABOUT YOUR PRE-OP INSTRUCTIONS PLEASE CALL [phone]    If you test positive for Covid or have been in contact with anyone that has tested positive in the last 10 days please notify you surgeon.    [place] - Preparing for Surgery Before surgery, you can play an important role.  Because skin is not sterile,  your skin needs to be as free of germs as possible.  You can reduce the number of germs on your skin by washing with CHG (chlorahexidine gluconate) soap before surgery.  CHG is an antiseptic cleaner which kills germs and bonds with the skin to continue killing germs even after washing. Please DO NOT use if you have an allergy to CHG or antibacterial soaps.  If your skin becomes reddened/irritated stop using the CHG and inform your nurse when you arrive at Short Stay. Do not shave (including legs and underarms) for at least 48 hours prior to the first CHG shower.  You may shave your face/neck. Please follow these instructions carefully:  1.  Shower with CHG Soap the night before surgery and the  morning of Surgery.  2.  If you choose to wash your hair, wash your hair first as usual with your  normal  shampoo.  3.  After you shampoo, rinse your hair and body thoroughly to remove the  shampoo.                           4.  Use CHG as you would any other liquid soap.  You can apply chg directly  to the skin and wash                       Gently with  a scrungie or clean washcloth.  5.  Apply the CHG Soap to your body ONLY FROM THE NECK DOWN.   Do not use on face/ open                           Wound or open sores. Avoid contact with eyes, ears mouth and genitals (private parts).                       Wash face,  Genitals (private parts) with your normal soap.             6.  Wash thoroughly, paying special attention to the area where your surgery  will be performed.  7.  Thoroughly rinse your body with warm water from the neck down.  8.  DO NOT shower/wash with your normal soap after using and rinsing off  the CHG Soap.                9.  Pat yourself dry with a clean towel.            10.  Wear clean pajamas.            11.  Place clean sheets on your bed the night of your first shower and do not  sleep with pets. Day of Surgery : Do not apply any lotions/deodorants the morning of surgery.  Please wear  clean clothes to the hospital/surgery center.  FAILURE TO FOLLOW THESE INSTRUCTIONS MAY RESULT IN THE CANCELLATION OF YOUR SURGERY PATIENT SIGNATURE_________________________________  NURSE SIGNATURE__________________________________  ________________________________________________________________________

## 2023-07-16 NOTE — Progress Notes (Signed)
 Physical Therapy Session Note  Patient Details  Name: Calvin Schultz MRN: [id number] Date of Birth: 02/23/1951  Today's Date: 07/16/2023 PT Individual Time: 0102-7253, [id number] PT Individual Time Calculation (min): 70 min, 65 min   Short Term Goals: Week 1:  PT Short Term Goal 1 (Week 1): Pt will complete bed mobility with modA consistently. PT Short Term Goal 2 (Week 1): Pt will complete sit to stand with maxA. PT Short Term Goal 3 (Week 1): Pt will complete bed to chair with maxA consistently. PT Short Term Goal 4 (Week 1): Pt will initiate gait training.  Skilled Therapeutic Interventions/Progress Updates:     Pt supine in bed upon arrival. Pt agreeable to therapy. Pt reports B knee pain, and L shoulder pain, premedicated. Therapist provided rest breaks and repositioning.   Pt performed supine to sit with use of bed features and min A, verbal cues provided for technique. Pt performed sit to supine with max A for trunk and B LE.   Pt performed lateral trunk lean onto elbow B for placement of sara plus sling.   Pt performed sit to stand with use of sara plus x3, first trial-unable to tolerate full erect posture but noted contraction of glutes and quads-pt held for 1.5 min. 2nd trial, pt able to achieve erect standing position holding for 20 seconds. On 3rd trail, pt able to hold erect standing for 2 min, and began repositioning shoulders for improved thoracic and cervical extension. Pt overall still very pain dominant, and requires a lot of encouragement, and increased rest breaks but pt motivated to improve.   Pt supine in bed with B LE elevated and needs within reach and bed alarm on.   Treatmetn Session 2  Pt supine in bed upon arrival. PT agreeable to therapy. Pt denies any pain.   Supine to sit with use of bed rails and bed flat with max A, with tactile cues provided at rib cage for core activation, and verbal and tactile cues provided for UE positioning   Seated EOB pt performed  the following exercises for B LE/core strengthening, and to improve independence with dynamic seated balance/transfers:   [place] twist x10 while holding pt wife purse  Mini crunch x10 with arms across pt chest and pt leaning back into therapist hands--pt demos L lateral trunk lean with this activity  Lateral trunk leans onto forearm x10 B  Reaching with R UE to therapist hand positioned to R of pt  Cross body reaching with R UE to therapist hand positined to L of pt   Forward trunk lean x10    B LE LAQ x10   B LE heel toe raises x10   B LE seated knee flexion with pt legs dangling EOB, progressed to x10 active assisted from therapist holding 5", progressed to active assisted with use of pt alternate LE holding for 5"    B LE heel slides active x10. Active assisted x10 with use of towel and therapist  Sit to supine with max A, for trunk and B LE, pt scooted up in bed with +2 A.   Pt supine in bed with all needs within reach and bed alarm on.        Therapy Documentation Precautions:  Precautions Precautions: Fall Precaution/Restrictions Comments: L nephrostomy tube, foley catheter Restrictions Weight Bearing Restrictions Per Provider Order: No  Therapy/Group: Individual Therapy  [facility] Ambrose [place], [place], DPT  07/16/2023, 7:43 AM

## 2023-07-16 NOTE — Plan of Care (Signed)
  Problem: Consults Goal: RH GENERAL PATIENT EDUCATION Description: See Patient Education module for education specifics. Outcome: Progressing   Problem: RH BOWEL ELIMINATION Goal: RH STG MANAGE BOWEL WITH ASSISTANCE Description: STG Manage Bowel withtoileting Assistance. Outcome: Progressing   Problem: RH BLADDER ELIMINATION Goal: RH STG MANAGE BLADDER WITH ASSISTANCE Description: STG Manage Bladder With equipment/min Assistance Outcome: Progressing   Problem: RH SKIN INTEGRITY Goal: RH STG SKIN FREE OF INFECTION/BREAKDOWN Description: Maintain skin free of infection with min assist Outcome: Progressing   Problem: RH SKIN INTEGRITY Goal: RH STG MAINTAIN SKIN INTEGRITY WITH ASSISTANCE Description: STG Maintain Skin Integrity With min Assistance. Outcome: Progressing   Problem: RH SAFETY Goal: RH STG ADHERE TO SAFETY PRECAUTIONS W/ASSISTANCE/DEVICE Description: STG Adhere to Safety Precautions With cues Assistance/Device. Outcome: Progressing   Problem: RH KNOWLEDGE DEFICIT GENERAL Goal: RH STG INCREASE KNOWLEDGE OF SELF CARE AFTER HOSPITALIZATION Description: Patient and spouse will be able to manage care using educational resources for medications and dietary modification independently Outcome: Progressing

## 2023-07-16 NOTE — Patient Care Conference (Signed)
 Inpatient RehabilitationTeam Conference and Plan of Care Update Date: 07/15/2023   Time: 12:14 PM    Patient Name: Calvin Schultz      Medical Record Number: [id number]  Date of Birth: February 26, 1951 Sex: Male         Room/Bed: 4M01C/4M01C-01 Payor Info: Payor: [employer] / Plan: [place] PREFERRED / Product Type: *No Product type* /    Admit Date/Time:  07/10/2023  2:55 PM  Primary Diagnosis:  Debility  Hospital Problems: Principal Problem:   Debility    Expected Discharge Date: Expected Discharge Date: 07/31/23  Team Members Present: Physician leading conference: Dr. Sula Soda Social Worker Present: Dossie Der, LCSW Nurse Present: Chana Bode, RN PT Present: Ambrose Finland, PT OT Present: Lou Cal, OT PPS Coordinator present : Fae Pippin, SLP     Current Status/Progress Goal Weekly Team Focus  Bowel/Bladder   Left Nephosptomy tube inplace good return, I/O cath Bladder scan, LBM 07/15/23   Assess and monitor for changes QS/PRN   Continue voiding trial and monitoring of Neprostomy yube QS/PRN, Contnue scans and strict I/O monitoring    Swallow/Nutrition/ Hydration               ADL's   Mod A for UB care (painful L-shoulder); Total A for LB care (painful B-knees and restricted ROM)   Setup/supervision for UB care; Min A for LB care & functional transfers   General conditioning, LB AE as appropriate, functional transfers, activity tolerance    Mobility   bed mobility heavy mod-max A with use of bed features, slide board transfer +2 max A, sit to stand unable 2/2 knee flexion ROM deficits   min A  follow up HHPT, DME: TBD closer to DC, pt has lift chair recliner and cane D/C 3/28 barriers to discharge: severe B knee pain limiting knee ROM, for positioning for transfers, overall debility/fatigue, pain in L shoulder --from dialysis port    Communication                Safety/Cognition/ Behavioral Observations               Pain   Rate pain 8-10/19  pain scale to bilateral LE'S   <3-4/10   Assess pain QS/PRN, with fillow up    Skin   Stage 2 areas to right and left buttocks areas   maintain healing to area wound aras of sacral at all times,QS/PRN  Assess QS/PRN, enocurage patient to reposition q2 hrs and prn, provide treament and monitorig as order      Discharge Planning:  HOme with wife who is taking a FMLA from her job, she has knee issues also. She is here daily hopefully she can provide the care he will need at DC   Team Discussion: Patient with debility post C-diff and leukocytosis and chronic anemia, with nephrostomy tube and pending ureter stent placement. Foley for hydronephrosis discontinued; patient with inability to void on his own, requiring I+O catheterization.   Patient on target to meet rehab goals: Currently needs mod assist for upper body care with decreased ROM and pain in lower body/knees.  Needs mod assist for bed mobility; unable to complete a sit - stand transfer. Requires max assist +2 for slide board transfers. Goals for discharge set for set up for upper body - min assist for lower body.  *See Care Plan and progress notes for long and short-term goals.   Revisions to Treatment Plan:  A/PROM Knee sleeves   Teaching Needs: Safety, medications, skin care,  pressure relief measures, bowel/bladder management, toileting, transfers, etc.   Current Barriers to Discharge: Decreased caregiver support  Possible Resolutions to Barriers: Family education HH follow up services [id number]     Medical Summary Current Status: debility after colitis. wbc's falling. hgb stable. voiding trial this week. nephrostomy in place. pt off HD--nephro following. bilateral knee pain  Barriers to Discharge: Medical stability   Possible Resolutions to Barriers/Weekly Focus: voiding trial, cath prn, nephrostomy mgt, scheduled tramadol for knees. will order bilateral knee sleeves for support, regular lab follow  up   Continued Need for Acute Rehabilitation Level of Care: The patient requires daily medical management by a physician with specialized training in physical medicine and rehabilitation for the following reasons: Direction of a multidisciplinary physical rehabilitation program to maximize functional independence : Yes Medical management of patient stability for increased activity during participation in an intensive rehabilitation regime.: Yes Analysis of laboratory values and/or radiology reports with any subsequent need for medication adjustment and/or medical intervention. : Yes   I attest that I was present, lead the team conference, and concur with the assessment and plan of the team.   Chana Bode B 07/16/2023, 11:27 AM

## 2023-07-16 NOTE — Progress Notes (Signed)
 [facility] NEPHROLOGY PROGRESS NOTE  Assessment/ Plan: Pt is a 73 y.o. yo male    # AKI on CKD 4 - AKI is ischemic and pre-renal insults related to hypotension +/- ARB and intravasc vol depletion.  CT w/o obstruction, has L neph tube.  He was on CRRT from 2/22 - 2/25.  Note that Cr 4.06- 5.3 from jan 2025, eGFR 11-15 ml/min.  For reference in 01/2022 with Cr 2.48 from Care Everywhere Cont holding HD and watching labs and UOP; creatinine is leveling trending down, increasing urine output. No vascular access currenlty, TDC if needed  Removed foley catheter, has PCN No Need for HD today.  # Metabolic acidosis -on sod bicarb. #Shock/ hypotension - resolved #L nephrostomy tube - placed in Jan 2025 by IR, and is f/b urology. Has staghorn calculi on the L and had previously been scheduled to have stone removed and stent placed per his wife on March 7th at [facility]. Postponed #Urinary retention - as above, per CIR #Chronic atrial fib - on eliquis; per primary team  #Hypophosphatemia secondary to CRRT - resolved #Hypokalemia -  resolved #Anemia normocytic: cont ESA; PRBC's per primary team  Subjective: No new event.  Urine output is recorded around 2.9 L.  Denies nausea, vomiting, chest pain, shortness of breath.  Working with physical therapist.  Objective Vital signs in last 24 hours: Vitals:   07/15/23 0542 07/15/23 1542 07/15/23 2029 07/16/23 0635  BP: 116/60 (!) 140/67 137/75   Pulse: (!) 55 84 70   Resp: 18 18 16    Temp: 97.9 F (36.6 C) 98.2 F (36.8 C) 98 F (36.7 C)   TempSrc: Oral     SpO2: 97% 100% 100%   Weight:    102.8 kg  Height:       Weight change: 0.2 kg  Intake/Output Summary (Last 24 hours) at 07/16/2023 1217 Last data filed at 07/16/2023 1000 Gross per 24 hour  Intake 480 ml  Output 2475 ml  Net -1995 ml       Labs: RENAL PANEL Recent Labs  Lab 07/10/23 0600 07/11/23 0627 07/12/23 0731 07/13/23 0635 07/14/23 0507 07/15/23 0504  07/16/23 0606  NA 131* 133* 130* 131* 131* 131* 134*  K 4.4 4.6 4.8 4.1 4.1 3.7 4.2  CL 103 103 103 104 106 104 107  CO2 21* 21* 16* 19* 19* 20* 18*  GLUCOSE 109* 146* 120* 107* 105* 91 80  BUN 52* 54* 57* 56* 54* 53* 49*  CREATININE 5.04* 5.08* 5.10* 5.16* 4.91* 4.75* 4.27*  CALCIUM 7.9* 8.1* 8.1* 8.0* 7.5* 8.1* 8.2*  MG 1.9  --   --   --   --   --   --   PHOS 5.4*  --   --   --   --   --   --   ALBUMIN 1.9* 1.7*  --   --   --   --   --     Liver Function Tests: Recent Labs  Lab 07/10/23 0600 07/11/23 0627  AST  --  11*  ALT  --  11  ALKPHOS  --  72  BILITOT  --  0.7  PROT  --  4.8*  ALBUMIN 1.9* 1.7*   No results for input(s): "LIPASE", "AMYLASE" in the last 168 hours. No results for input(s): "AMMONIA" in the last 168 hours. CBC: Recent Labs    06/03/23 1610 06/04/23 0503 06/04/23 1126 06/05/23 9604 07/03/23 1514 07/04/23 0140 07/09/23 5409 07/11/23 8119 07/12/23  2956 07/13/23 0635 07/15/23 0504  HGB 10.2*   < >  --    < >  --    < > 7.7* 7.1* 8.8* 8.2* 8.1*  MCV 85.5   < >  --    < >  --    < > 90.2 90.1 89.3 89.3 88.5  FERRITIN  --   --  32  --  88  --   --   --   --   --   --   TIBC 384  --   --   --  179*  --   --   --   --   --   --   IRON 45  --   --   --  35*  --   --   --   --   --   --    < > = values in this interval not displayed.    Cardiac Enzymes: No results for input(s): "CKTOTAL", "CKMB", "CKMBINDEX", "TROPONINI" in the last 168 hours. CBG: Recent Labs  Lab 07/15/23 1216 07/15/23 1701 07/15/23 2104 07/16/23 0603 07/16/23 0635  GLUCAP 119* 160* 105* 66* 100*    Iron Studies: No results for input(s): "IRON", "TIBC", "TRANSFERRIN", "FERRITIN" in the last 72 hours. Studies/Results: No results found.  Medications: Infusions:   Scheduled Medications:  apixaban  5 mg Oral BID   Chlorhexidine Gluconate Cloth  6 each Topical Q12H   darbepoetin (ARANESP) injection - NON-DIALYSIS  40 mcg Subcutaneous Q Fri-1800   diclofenac Sodium  2  g Topical QID   iron polysaccharides  150 mg Oral Daily   midodrine  10 mg Oral TID WC   multivitamin  1 tablet Oral QHS   Muscle Rub   Topical TID   tamsulosin  0.4 mg Oral QPC supper   traMADol  50 mg Oral BID    have reviewed scheduled and prn medications.  Physical Exam: General:NAD, comfortable Heart:RRR, s1s2 nl Lungs:clear b/l, no crackle Abdomen:soft, Non-tender, non-distended Extremities:No edema Dialysis Access: None.  Calvin Schultz 07/16/2023,12:17 PM  LOS: 6 days

## 2023-07-16 NOTE — Plan of Care (Signed)
  Problem: Consults Goal: RH GENERAL PATIENT EDUCATION Description: See Patient Education module for education specifics. Outcome: Progressing   Problem: RH BOWEL ELIMINATION Goal: RH STG MANAGE BOWEL WITH ASSISTANCE Description: STG Manage Bowel withtoileting Assistance. Outcome: Progressing Flowsheets (Taken 07/16/2023 1329) STG: Pt will manage bowels with assistance: 2-Maximum assistance   Problem: RH BLADDER ELIMINATION Goal: RH STG MANAGE BLADDER WITH ASSISTANCE Description: STG Manage Bladder With equipment/min Assistance Outcome: Not Progressing Flowsheets (Taken 07/16/2023 1329) STG: Pt will manage bladder with assistance: 1-Total assistance   Problem: RH SAFETY Goal: RH STG ADHERE TO SAFETY PRECAUTIONS W/ASSISTANCE/DEVICE Description: STG Adhere to Safety Precautions With cues Assistance/Device. Outcome: Progressing   Problem: RH KNOWLEDGE DEFICIT GENERAL Goal: RH STG INCREASE KNOWLEDGE OF SELF CARE AFTER HOSPITALIZATION Description: Patient and spouse will be able to manage care using educational resources for medications and dietary modification independently Outcome: Progressing

## 2023-07-16 NOTE — Progress Notes (Addendum)
 Physical Therapy Session Note  Patient Details  Name: Calvin Schultz MRN: [id number] Date of Birth: 1950-10-18  Today's Date: 07/16/2023 PT Individual Time: 1914-7829 PT Individual Time Calculation (min): 43 min   Short Term Goals: Week 1:  PT Short Term Goal 1 (Week 1): Pt will complete bed mobility with modA consistently. PT Short Term Goal 2 (Week 1): Pt will complete sit to stand with maxA. PT Short Term Goal 3 (Week 1): Pt will complete bed to chair with maxA consistently. PT Short Term Goal 4 (Week 1): Pt will initiate gait training.  Skilled Therapeutic Interventions/Progress Updates: Patient supine in bed with wife present on entrance to room. Patient alert and agreeable to PT session.   Patient reported 10/10 pain B knees with pain medication provided prior to therapy. Rest breaks provided prn.  Therapeutic Activity: Bed Mobility: Pt performed supine<>sit on EOB with mod/maxA .Pt advances B LE's close to off of bed, then used R UE on L HOB railing to assist with rolling slightly to L side with VC for hand sequence to assist with truncal elevation (HOB elevated). Pt required mod/maxA to advance B LE's into bed at end of session with VC to move L LE closer to R when elevating (while leaning to L elbow) Transfers: Pt performed sliding board transfers throughout session with maxA from EOB<>TIS (bed elevated/lowered to assist with transferring in and out of bed). Provided VC for head/hip relationship/sequence. Pt transported from room<>ortho gym in TIS.  Therapeutic Exercise: Pt performed the following exercises with therapist providing the described cuing and facilitation for improvement. - Sitting EOB prior to sliding board transfer performing x 10 B LAQ with added cue to bring knees to peak flexion (<90* ability). PTA then provided isometric hold (light resistance) of B knees in flexion with cues for pt to extend following x 8. Pt then provided with isometric hold of quadriceps (min  resistance) with cues to bring knees into flexion as far as able. - B hip flexion sitting TIS with PTA providing maxA into hip flexion with cues for pt to control eccentric (2 x 5) with active rest breaks of pt performing LAQ  Patient supine in bed at end of session with brakes locked, nsg present to perform in-out cath, wife present, and all needs within reach.      Therapy Documentation Precautions:  Precautions Precautions: Fall Precaution/Restrictions Comments: L nephrostomy tube, foley catheter Restrictions Weight Bearing Restrictions Per Provider Order: No   Therapy/Group: Individual Therapy  [name] PTA 07/16/2023, 12:25 PM

## 2023-07-16 NOTE — Progress Notes (Signed)
 Patient ID: Calvin Schultz, male   DOB: 1950-07-09, 73 y.o.   MRN: [id number]  FMLA form completed by PA and faxed to [employer] Dept have given original form back to wife for her records.

## 2023-07-16 NOTE — Progress Notes (Signed)
 Occupational Therapy Session Note  Patient Details  Name: Malosi Hemstreet MRN: [id number] Date of Birth: 06-26-1950  {CHL IP REHAB OT TIME CALCULATIONS:[id number]}   Short Term Goals: Week 1:  OT Short Term Goal 1 (Week 1): The pt will transfer from supine in bed to chair LOF with ModA using a sliding board x2 at 95% safe. OT Short Term Goal 2 (Week 1): The pt will complete UB dressing with s/u A at 95% safe OT Short Term Goal 3 (Week 1): The pt will complete LB bathing with  MinA incorporating AE at 95% safe OT Short Term Goal 4 (Week 1): The pt will complete LB dressing with MinA incorporating AE at 95% safe OT Short Term Goal 5 (Week 1): The pt will tolerate > 45 minutes of OT activity at 95% safe.  Skilled Therapeutic Interventions/Progress Updates:    Patient agreeable to participate in OT session. Reports *** pain level.   Patient participated in skilled OT session focusing on ***. Therapist facilitated/assessed/developed/educated/integrated/elicited *** in order to improve/facilitate/promote    Therapy Documentation Precautions:  Precautions Precautions: Fall Precaution/Restrictions Comments: L nephrostomy tube, foley catheter Restrictions Weight Bearing Restrictions Per Provider Order: No   Therapy/Group: Individual Therapy  Limmie Patricia, OTR/L,CBIS  Supplemental OT - [facility] and [facility] Secure Chat Preferred   07/16/2023, 11:59 PM

## 2023-07-16 NOTE — Progress Notes (Signed)
 Provider Love, PA called informed and made aware of appearance of patient's recent catheterization of fluids from bladder. Fluids yellow/straw like in color changed to white prior to catheterization emptying fluids from bladder. Fluid thick and urine becoming cloudy in appearance with blood clots/sediment observed. Verbal orders obtained to collect a UA and order CBC w/diff in the AM on 07/17/23.

## 2023-07-17 ENCOUNTER — Ambulatory Visit: Payer: No Typology Code available for payment source | Attending: Cardiology | Admitting: Physician Assistant

## 2023-07-17 ENCOUNTER — Encounter (HOSPITAL_COMMUNITY)
Admission: RE | Admit: 2023-07-17 | Discharge: 2023-07-17 | Disposition: A | Discharge status: Home / Self Care | Payer: No Typology Code available for payment source | Source: Ambulatory Visit | Attending: Family Medicine | Admitting: Family Medicine

## 2023-07-17 DIAGNOSIS — R339 Retention of urine, unspecified: Secondary | ICD-10-CM

## 2023-07-17 DIAGNOSIS — R600 Localized edema: Secondary | ICD-10-CM | POA: Diagnosis not present

## 2023-07-17 DIAGNOSIS — R5381 Other malaise: Secondary | ICD-10-CM | POA: Diagnosis not present

## 2023-07-17 DIAGNOSIS — N184 Chronic kidney disease, stage 4 (severe): Secondary | ICD-10-CM | POA: Diagnosis not present

## 2023-07-17 DIAGNOSIS — N200 Calculus of kidney: Secondary | ICD-10-CM | POA: Diagnosis not present

## 2023-07-17 LAB — URINALYSIS, ROUTINE W REFLEX MICROSCOPIC
Bilirubin Urine: NEGATIVE
Glucose, UA: 50 mg/dL — AB
Ketones, ur: NEGATIVE mg/dL
Nitrite: NEGATIVE
Protein, ur: 100 mg/dL — AB
Specific Gravity, Urine: 1.006 (ref 1.005–1.030)
WBC, UA: >50 WBC/hpf (ref 0–5)
pH: 6 (ref 5.0–8.0)

## 2023-07-17 LAB — CBC WITH DIFFERENTIAL/PLATELET
Abs Immature Granulocytes: 0.11 10*3/uL — ABNORMAL HIGH (ref 0.00–0.07)
Basophils Absolute: 0.1 10*3/uL (ref 0.0–0.1)
Basophils Relative: 0 %
Eosinophils Absolute: 0.3 10*3/uL (ref 0.0–0.5)
Eosinophils Relative: 2 %
HCT: 30.9 % — ABNORMAL LOW (ref 39.0–52.0)
Hemoglobin: 9.1 g/dL — ABNORMAL LOW (ref 13.0–17.0)
Immature Granulocytes: 1 %
Lymphocytes Relative: 6 %
Lymphs Abs: 0.9 10*3/uL (ref 0.7–4.0)
MCH: 26.6 pg (ref 26.0–34.0)
MCHC: 29.4 g/dL — ABNORMAL LOW (ref 30.0–36.0)
MCV: 90.4 fL (ref 80.0–100.0)
Monocytes Absolute: 1 10*3/uL (ref 0.1–1.0)
Monocytes Relative: 7 %
Neutro Abs: 11.2 10*3/uL — ABNORMAL HIGH (ref 1.7–7.7)
Neutrophils Relative %: 84 %
Platelets: 489 10*3/uL — ABNORMAL HIGH (ref 150–400)
RBC: 3.42 MIL/uL — ABNORMAL LOW (ref 4.22–5.81)
RDW: 16.3 % — ABNORMAL HIGH (ref 11.5–15.5)
WBC: 13.4 10*3/uL — ABNORMAL HIGH (ref 4.0–10.5)
nRBC: 0 % (ref 0.0–0.2)

## 2023-07-17 LAB — BASIC METABOLIC PANEL
Anion gap: 8 (ref 5–15)
BUN: 42 mg/dL — ABNORMAL HIGH (ref 8–23)
CO2: 19 mmol/L — ABNORMAL LOW (ref 22–32)
Calcium: 8.6 mg/dL — ABNORMAL LOW (ref 8.9–10.3)
Chloride: 109 mmol/L (ref 98–111)
Creatinine, Ser: 3.88 mg/dL — ABNORMAL HIGH (ref 0.61–1.24)
GFR, Estimated: 16 mL/min — ABNORMAL LOW (ref >60)
Glucose, Bld: 88 mg/dL (ref 70–99)
Potassium: 3.7 mmol/L (ref 3.5–5.1)
Sodium: 136 mmol/L (ref 135–145)

## 2023-07-17 LAB — GLUCOSE, CAPILLARY
Glucose-Capillary: 85 mg/dL (ref 70–99)
Glucose-Capillary: 161 mg/dL — ABNORMAL HIGH (ref 70–99)

## 2023-07-17 MED ORDER — CEPHALEXIN 250 MG PO CAPS
250.0000 mg | ORAL_CAPSULE | Freq: Two times a day (BID) | ORAL | Status: AC
  Administered 2023-07-20 – 2023-07-24 (×9): 250 mg via ORAL
  Filled 2023-07-17 (×9): qty 1

## 2023-07-17 NOTE — Progress Notes (Signed)
 [facility] NEPHROLOGY PROGRESS NOTE  Assessment/ Plan: Pt is a 73 y.o. yo male    # AKI on CKD 4 - AKI is ischemic and pre-renal insults related to hypotension +/- ARB and intravasc vol depletion.  CT w/o obstruction, has L neph tube.  He was on CRRT from 2/22 - 2/25.  Note that Cr 4.06- 5.3 from jan 2025, eGFR 11-15 ml/min.  For reference in 01/2022 with Cr 2.48 from Care Everywhere Cont holding HD and watching labs and UOP; creatinine is leveling trending down, increasing urine output. No vascular access currenlty, TDC if needed  Removed foley catheter, has PCN No Need for HD today. Have made great strides the last 4 days in volume and clearance  # Metabolic acidosis -improved #Shock/ hypotension - resolved #L nephrostomy tube - placed in Jan 2025 by IR, and is f/b urology. Has staghorn calculi on the L and had previously been scheduled to have stone removed and stent placed per his wife on March 7th at [facility]. Postponed #Urinary retention - as above, per CIR #Chronic atrial fib - on eliquis; per primary team  #Hypophosphatemia secondary to CRRT - resolved #Hypokalemia -  resolved #Anemia normocytic: cont ESA; PRBC's per primary team  Subjective: No new event.  Urine output is recorded around 2.8 L.  Denies nausea, vomiting, chest pain, shortness of breath.  Wife says edema is much better-  over the last 4 days crt has improved from 5.1 to 3.8  Objective Vital signs in last 24 hours: Vitals:   07/15/23 2029 07/16/23 0635 07/16/23 1414 07/17/23 0500  BP: 137/75  (!) 140/71 133/80  Pulse: 70  93 89  Resp: 16  18 18   Temp: 98 F (36.7 C)  97.9 F (36.6 C) 98 F (36.7 C)  TempSrc:   Oral Oral  SpO2: 100%     Weight:  102.8 kg    Height:       Weight change:   Intake/Output Summary (Last 24 hours) at 07/17/2023 1247 Last data filed at 07/17/2023 0519 Gross per 24 hour  Intake 560 ml  Output 2300 ml  Net -1740 ml       Labs: RENAL PANEL Recent Labs   Lab 07/11/23 0627 07/12/23 0731 07/13/23 0635 07/14/23 0507 07/15/23 0504 07/16/23 0606 07/17/23 0856  NA 133*   < > 131* 131* 131* 134* 136  K 4.6   < > 4.1 4.1 3.7 4.2 3.7  CL 103   < > 104 106 104 107 109  CO2 21*   < > 19* 19* 20* 18* 19*  GLUCOSE 146*   < > 107* 105* 91 80 88  BUN 54*   < > 56* 54* 53* 49* 42*  CREATININE 5.08*   < > 5.16* 4.91* 4.75* 4.27* 3.88*  CALCIUM 8.1*   < > 8.0* 7.5* 8.1* 8.2* 8.6*  ALBUMIN 1.7*  --   --   --   --   --   --    < > = values in this interval not displayed.    Liver Function Tests: Recent Labs  Lab 07/11/23 0627  AST 11*  ALT 11  ALKPHOS 72  BILITOT 0.7  PROT 4.8*  ALBUMIN 1.7*   No results for input(s): "LIPASE", "AMYLASE" in the last 168 hours. No results for input(s): "AMMONIA" in the last 168 hours. CBC: Recent Labs    06/03/23 2956 06/04/23 0503 06/04/23 1126 06/05/23 2130 07/03/23 1514 07/04/23 0140 07/11/23 8657 07/12/23 8469 07/13/23  9604 07/15/23 0504 07/17/23 0856  HGB 10.2*   < >  --    < >  --    < > 7.1* 8.8* 8.2* 8.1* 9.1*  MCV 85.5   < >  --    < >  --    < > 90.1 89.3 89.3 88.5 90.4  FERRITIN  --   --  32  --  88  --   --   --   --   --   --   TIBC 384  --   --   --  179*  --   --   --   --   --   --   IRON 45  --   --   --  35*  --   --   --   --   --   --    < > = values in this interval not displayed.    Cardiac Enzymes: No results for input(s): "CKTOTAL", "CKMB", "CKMBINDEX", "TROPONINI" in the last 168 hours. CBG: Recent Labs  Lab 07/15/23 1701 07/15/23 2104 07/16/23 0603 07/16/23 0635 07/17/23 0627  GLUCAP 160* 105* 66* 100* 85    Iron Studies: No results for input(s): "IRON", "TIBC", "TRANSFERRIN", "FERRITIN" in the last 72 hours. Studies/Results: No results found.  Medications: Infusions:   Scheduled Medications:  apixaban  5 mg Oral BID   [START ON 07/20/2023] cephALEXin  250 mg Oral BID   darbepoetin (ARANESP) injection - NON-DIALYSIS  40 mcg Subcutaneous Q Fri-1800    diclofenac Sodium  2 g Topical QID   iron polysaccharides  150 mg Oral Daily   midodrine  10 mg Oral TID WC   multivitamin  1 tablet Oral QHS   Muscle Rub   Topical TID   tamsulosin  0.4 mg Oral QPC supper   traMADol  50 mg Oral BID    have reviewed scheduled and prn medications.  Physical Exam: General:NAD, comfortable Heart:RRR, s1s2 nl Lungs:clear b/l, no crackle Abdomen:soft, Non-tender, non-distended Extremities:No edema Dialysis Access: None.  Calvin Schultz 07/17/2023,12:47 PM  LOS: 7 days

## 2023-07-17 NOTE — Progress Notes (Addendum)
 PROGRESS NOTE   Subjective/Complaints: No new issues. Tired from therapies. Still having pain in knees as well as left shoulder. Tramadol does help as well as heat.  Pt had to be cathed yesterday afternoon and urine was cloudy, blood clots seen, ?white discharge. Pt denies any discomfort, fever/chilss.   ROS: Patient denies fever, rash, sore throat, blurred vision, dizziness, nausea, vomiting, diarrhea, cough, shortness of breath or chest pain,  headache, or mood change.    Objective:   No results found.  Recent Labs    07/15/23 0504  WBC 15.8*  HGB 8.1*  HCT 26.2*  PLT 443*   Recent Labs    07/15/23 0504 07/16/23 0606  NA 131* 134*  K 3.7 4.2  CL 104 107  CO2 20* 18*  GLUCOSE 91 80  BUN 53* 49*  CREATININE 4.75* 4.27*  CALCIUM 8.1* 8.2*    Intake/Output Summary (Last 24 hours) at 07/17/2023 1610 Last data filed at 07/17/2023 9604 Gross per 24 hour  Intake 560 ml  Output 2875 ml  Net -2315 ml     Pressure Injury 07/10/23 Buttocks Right;Mid;Upper Stage 2 -  Partial thickness loss of dermis presenting as a shallow open injury with a red, pink wound bed without slough. Pink opened stage 2 right upper buttock (Active)  07/10/23 1608  Location: Buttocks  Location Orientation: Right;Mid;Upper  Staging: Stage 2 -  Partial thickness loss of dermis presenting as a shallow open injury with a red, pink wound bed without slough.  Wound Description (Comments): Pink opened stage 2 right upper buttock  Present on Admission: Yes     Pressure Injury 07/10/23 Buttocks Right;Mid;Upper Stage 2 -  Partial thickness loss of dermis presenting as a shallow open injury with a red, pink wound bed without slough. Pink stage 2 directly beside the right upper stage 2 (Active)  07/10/23 1609  Location: Buttocks  Location Orientation: Right;Mid;Upper  Staging: Stage 2 -  Partial thickness loss of dermis presenting as a shallow open injury  with a red, pink wound bed without slough.  Wound Description (Comments): Pink stage 2 directly beside the right upper stage 2  Present on Admission: Yes     Pressure Injury 07/10/23 Buttocks Right;Mid;Lower Stage 2 -  Partial thickness loss of dermis presenting as a shallow open injury with a red, pink wound bed without slough. Pink stage 2 to right buttock below the upper stage 2 (Active)  07/10/23 1610  Location: Buttocks  Location Orientation: Right;Mid;Lower  Staging: Stage 2 -  Partial thickness loss of dermis presenting as a shallow open injury with a red, pink wound bed without slough.  Wound Description (Comments): Pink stage 2 to right buttock below the upper stage 2  Present on Admission: Yes     Pressure Injury 07/10/23 Buttocks Left;Mid;Lower Stage 2 -  Partial thickness loss of dermis presenting as a shallow open injury with a red, pink wound bed without slough. Pink stage 2 to left buttock (Active)  07/10/23 1611  Location: Buttocks  Location Orientation: Left;Mid;Lower  Staging: Stage 2 -  Partial thickness loss of dermis presenting as a shallow open injury with a red, pink wound bed without slough.  Wound Description (Comments): Pink stage 2 to left buttock  Present on Admission: Yes    Physical Exam: Vital Signs Blood pressure 133/80, pulse 89, temperature 98 F (36.7 C), temperature source Oral, resp. rate 18, height 5\' 9"  (1.753 m), weight 102.8 kg, SpO2 100%.    Constitutional: No distress . Vital signs reviewed. HEENT: NCAT, EOMI, oral membranes moist Neck: supple Cardiovascular: RRR without murmur. No JVD    Respiratory/Chest: CTA Bilaterally without wheezes or rales. Normal effort    GI/Abdomen: BS +, non-tender, non-distended Ext: no clubbing, cyanosis, 1-2+ LE edema Psych: pleasant and cooperative  Neurological: HOH- alert Skin: C/D/I. No apparent lesions.  Tube sites appear clean/dry/intact.  Mepilex on left shoulder. Left drain in place/flank still  with clear yellow fluid -Buttocks MASD--improving compared to photo below    MSK:    Both knees with effusions, tight. Left shoulder tight in ER/IR/F/E  Pain more with IR/ER than any movement. Was able to abduct to 90+ degrees       Neurologic exam:  Alert and oriented x 3. Normal insight and awareness. Intact Memory. Normal language and speech. Cranial nerve exam unremarkable.  Reflexes: 2+ in BL UE and LEs.    CN: 2-12 grossly intact.       Strength:                RUE: 5/5 SA, 5/5 EF, 5/5 EE, 5/5 WE, 5/5 FF, 5/5 FA                LUE:  5/5 SA, 5/5 EF, 5/5 EE, 5/5 WE, 5/5 FF, 5/5 FA                RLE: 4/5 HF, 4/5 KE, 5/5  DF, 5/5  EHL, 5/5  PF--some pain inhib                 LLE:  3/5 HF, 4-/5 KE, 5/5  DF, 5/5  EHL, 5/5  PF --some pain inhib  Sensory exam normal for light touch and pain in all 4 limbs. No limb ataxia or cerebellar signs. No abnormal tone appreciated.      Assessment/Plan: 1. Functional deficits which require 3+ hours per day of interdisciplinary therapy in a comprehensive inpatient rehab setting. Physiatrist is providing close team supervision and 24 hour management of active medical problems listed below. Physiatrist and rehab team continue to assess barriers to discharge/monitor patient progress toward functional and medical goals  Care Tool:  Bathing  Bathing activity did not occur:  (patient completed a simulated task at bed LOF) Body parts bathed by patient: Right arm, Left arm, Chest, Abdomen, Front perineal area, Right upper leg, Left upper leg, Face   Body parts bathed by helper: Buttocks, Left lower leg, Right lower leg     Bathing assist Assist Level: Maximal Assistance - Patient 24 - 49%     Upper Body Dressing/Undressing Upper body dressing   What is the patient wearing?: Pull over shirt    Upper body assist Assist Level: Minimal Assistance - Patient > 75%    Lower Body Dressing/Undressing Lower body dressing      What is the patient  wearing?: Pants, Underwear/pull up, Incontinence brief     Lower body assist Assist for lower body dressing: Maximal Assistance - Patient 25 - 49%     Toileting Toileting    Toileting assist Assist for toileting: Dependent - Patient 0%     Transfers Chair/bed transfer  Transfers assist  Chair/bed transfer  activity did not occur: Safety/medical concerns (not safe to get up)  Chair/bed transfer assist level: 2 Helpers     Locomotion Ambulation   Ambulation assist   Ambulation activity did not occur: Safety/medical concerns          Walk 10 feet activity   Assist  Walk 10 feet activity did not occur: Safety/medical concerns        Walk 50 feet activity   Assist Walk 50 feet with 2 turns activity did not occur: Safety/medical concerns         Walk 150 feet activity   Assist Walk 150 feet activity did not occur: Safety/medical concerns         Walk 10 feet on uneven surface  activity   Assist Walk 10 feet on uneven surfaces activity did not occur: Safety/medical concerns         Wheelchair     Assist Is the patient using a wheelchair?: Yes Type of Wheelchair: Manual    Wheelchair assist level: Dependent - Patient 0% Max wheelchair distance: 150'    Wheelchair 50 feet with 2 turns activity    Assist        Assist Level: Dependent - Patient 0%   Wheelchair 150 feet activity     Assist      Assist Level: Dependent - Patient 0%   Blood pressure 133/80, pulse 89, temperature 98 F (36.7 C), temperature source Oral, resp. rate 18, height 5\' 9"  (1.753 m), weight 102.8 kg, SpO2 100%.   Medical Problem List and Plan: 1. Functional deficits secondary to debility related to septic shock/C. difficile colitis/AKI superimposed on CKD stage IV             -patient may shower with tubes/drains covered             -ELOS/Goals:  3/28, min assist PT/OT             -Continue CIR therapies including PT, OT     -ordered THT's for  edema control. See discussion below re: kidneys/nutritional status 2.  Antithrombotics: -DVT/anticoagulation:  Pharmaceutical: Eliquis             -antiplatelet therapy: N/A   3. Pain Management: tramadol prn, Zanaflex 4 mg twice daily as needed   Biliateral OA of knees  -ordered bilateral neoprine knee sleeves --try today  -voltaren gel tid to knees  -have scheduled tramadol 50mg  at 0700 and 1200 daily. Continue prn as well  Mild adhesive capsulitis left shoulder---added sports cream. Can use voltaren gel also. Aggressive ROM with therapy and while in bed 4. Mood/Behavior/Sleep: Trazodone 50 mg nightly as needed.  Provide emotional support             -antipsychotic agents: N/A   5. Neuropsych/cognition: This patient is capable of making decisions on his own behalf.   6. Skin/Wound Care: Routine skin checks   7. Fluids/Electrolytes/Nutrition:    -3/14 po intake remains poor, albumin low, weights falling  -will ask RD for assistance. Have spoken to patient about intake  -?IV albumin (see below)  -CMET monday 8.  Oliguric AKI superimposed on CKD stage IV.  Baseline creatinine 4.9.  CRRT discontinued 2/25.  No current plan for long-term dialysis.  Follow-up renal services              - Per discussion with nephrology Dr. Verna Czech, keep Foley catheter in to allow accurate I's and O's through Monday; then, can remove and do DC Foley trial while  continuing to document strict outputs             - Continue daily assessments of renal function per nephrology   3/8- per renal, Cr leveling out- and con't Foley- will remove Monday  -nephrology following, Cr remains in 4's.   3/14 renal function with some improvement today     -suspect lower extremity edema is related to renal function/low albumin   -?would he benefit from IV albumin infusion   -will reach out to nephrology today 9.  C. difficile colitis.  Completed course of oral vancomycin 3/7.  DC'd IV Flagyl 3/3. Enteric precautions   3/14  stools formed this morning 10.  Status post left nephrostomy tube d/t staghorn calculi. Has Foley Nephrostomy tube placed 1/29 per IR with plan for ureteric stent placement 3/21              - Careful monitoring of nephrostomy output  -3/14 flomax has been added to improve emptying   -spoke to Dr. Mena Goes today who will perform endoscopy on 3/21 to initially break up calculi/ remove nephrostomy tube. No stent apparently    -he requested prophylactic keflex 250mg  bid beginning 3/17    -UA with some positive findings, however, no other clinical signs of infection. WBC's down to 13k. Will observe only for now given hx of c diff.  11.  Chronic atrial fibrillation with episodes of bradycardia.  Followed by cardiology services.  Continue Eliquis.  Avoid AV nodal blocking agents   12.  Diabetes mellitus.  Hemoglobin A1c 6.2.    -tightly controlled. Dc SSI and change cbg checks to qam only   CBG (last 3)  Recent Labs    07/16/23 0603 07/16/23 0635 07/17/23 0627  GLUCAP 66* 100* 85    -encourage PO/ HS snack for low am readings    13.  Hypotension.  ProAmatine 10 mg 3 times daily.  Monitor with increased mobility   - Normotensive on admission  3/9- BP running 100-s to 120s systolic- con't regimen 14.  Chronic anemia.  Niferex daily/Aranesp.  Monitor for any bleeding episodes   3/8- Hb 7.1 today- will order transfusion- transfusion of 1 unit pRBCs.   3/14 hgb up to 9.1    16. Leukocytosis  3/9- Pt's WBC is 21.7- up from 16k- is afebrile- due to his recent C diff, that just finished correct dosing of ABX for, called ID for guidance on treatment- don't want to cause more Cdiff- ID consulted ---no new orders  3/10 WBC's down to 17k  --have been hovering above and below 20 for awhile. No new clinical symptoms. Nephrostomy drainage clear, yellow  3/14 wbc's down to 13k.--recheck Monday    Greater than 50 total minutes were spent in examination of patient, assessment of pertinent data,  formulation  of a treatment plan, and in discussion with patient, family, and/or treating providers.     LOS: 7 days A FACE TO FACE EVALUATION WAS PERFORMED  Ranelle Oyster 07/17/2023, 9:38 AM

## 2023-07-17 NOTE — Plan of Care (Signed)
  Problem: Consults Goal: RH GENERAL PATIENT EDUCATION Description: See Patient Education module for education specifics. Outcome: Progressing   Problem: RH BOWEL ELIMINATION Goal: RH STG MANAGE BOWEL WITH ASSISTANCE Description: STG Manage Bowel withtoileting Assistance. Outcome: Progressing   Problem: RH BLADDER ELIMINATION Goal: RH STG MANAGE BLADDER WITH ASSISTANCE Description: STG Manage Bladder With equipment/min Assistance Outcome: Progressing   Problem: RH SKIN INTEGRITY Goal: RH STG SKIN FREE OF INFECTION/BREAKDOWN Description: Maintain skin free of infection with min assist Outcome: Progressing Goal: RH STG MAINTAIN SKIN INTEGRITY WITH ASSISTANCE Description: STG Maintain Skin Integrity With min Assistance. Outcome: Progressing   Problem: RH SAFETY Goal: RH STG ADHERE TO SAFETY PRECAUTIONS W/ASSISTANCE/DEVICE Description: STG Adhere to Safety Precautions With cues Assistance/Device. Outcome: Progressing   Problem: RH PAIN MANAGEMENT Goal: RH STG PAIN MANAGED AT OR BELOW PT'S PAIN GOAL Description: < 4 with prns Outcome: Progressing   Problem: RH KNOWLEDGE DEFICIT GENERAL Goal: RH STG INCREASE KNOWLEDGE OF SELF CARE AFTER HOSPITALIZATION Description: Patient and spouse will be able to manage care using educational resources for medications and dietary modification independently Outcome: Progressing

## 2023-07-17 NOTE — Progress Notes (Signed)
 Physical Therapy Session Note  Patient Details  Name: Calvin Schultz MRN: [id number] Date of Birth: 20-Jul-1950  Today's Date: 07/17/2023 PT Individual Time: 1300-1415 PT Individual Time Calculation (min): 75 min   Short Term Goals: Week 1:  PT Short Term Goal 1 (Week 1): Pt will complete bed mobility with modA consistently. PT Short Term Goal 2 (Week 1): Pt will complete sit to stand with maxA. PT Short Term Goal 3 (Week 1): Pt will complete bed to chair with maxA consistently. PT Short Term Goal 4 (Week 1): Pt will initiate gait training.  Skilled Therapeutic Interventions/Progress Updates: Pt presents supine in bed and agreeable to therapy.  Pt requires brief change 2/2 incontinent bowel.  Pt rolled side to side w/ mod A to Left but increased to R 2/2 L shoulder pain/immobility.  P  Assists nurse w/ brief change.  Pt transfers sup to sit w/ max A to Left w/ log roll technique.  B knee sleeves already donned.  Pt leans to left for SB placement and then requires A +2 for transfer bed > w/c w/ cues for hand placement and forward lean.  Blocking of knees provided by PT.  Pt wheeled to main gym.  Pt performed 3 x 10 calf raises, LAQ and hip flexion w/ cues for increased knee flexion, L w/ more ROM.  Pt performed scap pinches, shoulder shrugs and cervical flex/extension for improved posture.  Pt wheeled to side of // bars and attempted sit to stand using bar, but unable even w/ max A 2/2 B knee pain.  Pt returned to room and remained sitting in TIS, reclined back and handed off to ancillary services/spouse.     Therapy Documentation Precautions:  Precautions Precautions: Fall Precaution/Restrictions Comments: L nephrostomy tube, foley catheter Restrictions Weight Bearing Restrictions Per Provider Order: No General:   Vital Signs:   Pain:8/10 Pain Assessment Pain Scale: 0-10 Pain Score: 9  Pain Location: Leg Pain Intervention(s): Medication (See eMAR)     Therapy/Group: Individual  Therapy  Lucio Edward 07/17/2023, 3:37 PM

## 2023-07-17 NOTE — Progress Notes (Signed)
 Physical Therapy Session Note  Patient Details  Name: Calvin Schultz MRN: [id number] Date of Birth: 05-10-1950  Today's Date: 07/17/2023 PT Individual Time: 0804-0904 PT Individual Time Calculation (min): 60 min  And  PT Amount of Missed Time (min): 15 Minutes PT Missed Treatment Reason: Other (Comment) (Pt requesting to rest and eat breakfast)  Short Term Goals: Week 1:  PT Short Term Goal 1 (Week 1): Pt will complete bed mobility with modA consistently. PT Short Term Goal 2 (Week 1): Pt will complete sit to stand with maxA. PT Short Term Goal 3 (Week 1): Pt will complete bed to chair with maxA consistently. PT Short Term Goal 4 (Week 1): Pt will initiate gait training.   Skilled Therapeutic Interventions/Progress Updates:  PPE donned prior to entering room for enteric/ contact precautions. Patient supine in bed on entrance to room. Patient alert and agreeable to PT session. Breakfast tray arrived but pt unwilling to eat. Relates wife is bringing him breakfast.   Patient with pain complaint of constant high level pain at start of session. Pt up to moving to w/c for improved positioning prior to wife's arrival.    Therapeutic Activity: Bed Mobility: Pt does not have compression socks in room and requires spiral compression wrap to BLE while in supine. Appropriate sized TED hose not available on unit. Applied with ~25% stretch to short stretch wrap. Once donned, Pt able to bring BLE to EOB with time and use of bed features. Then requires partial turn to side with UB in order to complete. Attempts to push up with LUE to reach upright seated position but is unable without ModA. Forward scoot performed with minimal movement requiring Min/ ModA to complete to good foot positioning on floor. While seated, socks and shoes donned with MaxA. Good seated balance throughout. Guided in BLE LAQs in gravity dependent positioning for improved lubrication of knee joint prior to use during  transfer.  Transfers: Pt performed slide board transfer toward R side into TIS w/c. Is able to initiate lean to L and lift of RLE but requires MaxA to appropriately place slide board under RLE. Pt requires slight elevation to bed and Mod/ MaxA to initiate then is able to perform minimal movements with gravity assist. Ultimately requires Min/ ModA for improved lateral movement with vc for forward lean and repositioning of hands for improved technique.   Patient seated upright in TIS w/c at end of session with brakes locked, belt alarm set, and all needs within reach. Wife in room. RN notified re: need for morning medications including pain medication.    Therapy Documentation Precautions:  Precautions Precautions: Fall Precaution/Restrictions Comments: L nephrostomy tube, foley catheter Restrictions Weight Bearing Restrictions Per Provider Order: No  Pain: Pt relates 9/10 pain in Bil knees d/t not having pain medication prior to session.   Therapy/Group: Individual Therapy  Loel Dubonnet PT, DPT, CSRS 07/17/2023, 10:29 AM

## 2023-07-17 NOTE — Plan of Care (Signed)
  Problem: Consults Goal: RH GENERAL PATIENT EDUCATION Description: See Patient Education module for education specifics. Outcome: Progressing   Problem: RH BOWEL ELIMINATION Goal: RH STG MANAGE BOWEL WITH ASSISTANCE Description: STG Manage Bowel withtoileting Assistance. Outcome: Progressing   Problem: RH BLADDER ELIMINATION Goal: RH STG MANAGE BLADDER WITH ASSISTANCE Description: STG Manage Bladder With equipment/min Assistance Outcome: Progressing   Problem: RH SKIN INTEGRITY Goal: RH STG SKIN FREE OF INFECTION/BREAKDOWN Description: Maintain skin free of infection with min assist Outcome: Progressing   Problem: RH SAFETY Goal: RH STG ADHERE TO SAFETY PRECAUTIONS W/ASSISTANCE/DEVICE Description: STG Adhere to Safety Precautions With cues Assistance/Device. Outcome: Progressing   Problem: RH PAIN MANAGEMENT Goal: RH STG PAIN MANAGED AT OR BELOW PT'S PAIN GOAL Description: < 4 with prns Outcome: Progressing

## 2023-07-17 NOTE — Progress Notes (Signed)
 Initial Nutrition Assessment  DOCUMENTATION CODES:   Obesity unspecified  INTERVENTION:  Pt to receive automatic meal trays to ensure a meal is not missed Magic cup TID with meals, each supplement provides 290 kcal and 9 grams of protein Liberalize diet to promote adequate po intake Encouraged family to bring in familiar foods from outside hospital to promote po intake Continue renal MVI daily  NUTRITION DIAGNOSIS:   Increased nutrient needs related to acute illness (AKI superimposed on CKD IV) as evidenced by estimated needs.  GOAL:   Patient will meet greater than or equal to 90% of their needs  MONITOR:   PO intake, Weight trends, Supplement acceptance  REASON FOR ASSESSMENT:   Consult Poor PO  ASSESSMENT:     Pt presented with N/V/D, admitted for AKI superimposed on renal failure and hypotension. Pt with PMH CKD IV, DM2, HLD, HTN.  1/29 nephrostomy tube placed 2/21 admit to [facility] 2/22 CRRT initiated 2/25 CRRT stopped  2/26 transferred to [facility]  3/1 FMS removed 3/7: admit to CIR  Staff providing care during visit. Spoke with pt's wife regarding pt's intake and appetite. Wife reports pt will not eat any of the meals from the hospital due to the smell. Wife reports pt will only eat 2 meals a day, which is standard for him at home. She reports she has been bringing in fast food for breakfast and getting sandwiches from the cafeteria for lunch for him. She reports pt will only eat the lunch meat off the sandwich and sherbet for lunch. She reports pt will not drink any nutrition supplements at all. Pt reports he will not eat the hospital food but says he has been doing well with the food his wife brings in for breakfast. Pt agreeable to magic cup but refuses any protein drinks, including Ensure, Boost, Boost breeze and prosource. Discussed with pt the importance of nutrition at this time, given multiple pressure injuries and exerting more energy while in rehab. Discussed  with pt that next steps would be a feeding tube if intake remains poor. Pt verbalized not wanting that and that he would try to eat more. Pt's wife reports not having a problem getting him food form the cafeteria or at home if it means he will eat. Encouraged pt to eat any familiar/favorite foods he thinks he can tolerate.  Meds: rena vit  Labs: BUN 42, Creatinine 3.88, CBGs range from 66-160 in last 24 hours  Current weight: 102.8 kg *Per chart review, weight has remained stable since 08/2021   NUTRITION - FOCUSED PHYSICAL EXAM: -defer to f/u; pt receiving care during visit.   Diet Order:   Diet Order             Diet Carb Modified Fluid consistency: Thin; Room service appropriate? No; Fluid restriction: 2000 mL Fluid  Diet effective now                   EDUCATION NEEDS:   Not appropriate for education at this time  Skin:  Skin Assessment: Skin Integrity Issues: Skin Integrity Issues:: Stage II, Incisions Stage II: left & right buttocks Incisions: coccyx  Last BM:  3/14-type 6  Height:   Ht Readings from Last 1 Encounters:  07/10/23 5\' 9"  (1.753 m)    Weight:   Wt Readings from Last 1 Encounters:  07/16/23 102.8 kg    Ideal Body Weight:  72.7 kg  BMI:  Body mass index is 33.47 kg/m.  Estimated Nutritional Needs:  Kcal:  2200-2400  Protein:  110-120 g  Fluid:  >/= 2.2 L    Maceo Pro, MS Dietetic Intern

## 2023-07-17 NOTE — Progress Notes (Deleted)
  Cardiology Office Note:  .   Date:  07/17/2023  ID:  Calvin Schultz, DOB 1951/01/24, MRN [id number] PCP: Calvin Found, MD  [facility] Providers Cardiologist:  None {    History of Present Illness: Marland Kitchen   Calvin Schultz is a 73 y.o. male with a past medical history and cardiovascular risk factors including insulin-dependent diabetes mellitus type 2 complicated by diabetic foot ulcer, HTN, HLD, stage III CKD, PAF, and RBBB here for follow-up appointment.  He was originally referred for evaluation of atrial fibrillation.  Patient recently went for follow-up visit to PCPs office and was noted to be in atrial fibrillation.  Started on Eliquis and referred to cardiology for further evaluation and management.  Patient stated at his last visit that he was asymptomatic with no prior history of intracranial or gastrointestinal bleeding.  Does have history of falls which he has been precipitated by either increased pain medicine or mechanical falls.  Office blood pressures were not well-controlled but at home usually in the range of 130 to 170 mmHg.  Diabetes also not well-controlled with hemoglobin A1c in the past as high as 15 per patient and family report.  Most recent hemoglobin A1c was 10.1.  He presents today for overdue follow-up appointment with his last follow-up April 2023. ***Recent admission for groin pain.  ROS: ***  Studies Reviewed: .        *** Risk Assessment/Calculations:   {Does this patient have ATRIAL FIBRILLATION?[phone]}         Physical Exam:   VS:  There were no vitals taken for this visit.   Wt Readings from Last 3 Encounters:  07/16/23 226 lb 10.1 oz (102.8 kg)  07/05/23 226 lb 10.1 oz (102.8 kg)  06/02/23 223 lb 15.8 oz (101.6 kg)    GEN: Well nourished, well developed in no acute distress NECK: No JVD; No carotid bruits CARDIAC: ***RRR, no murmurs, rubs, gallops RESPIRATORY:  Clear to auscultation without rales, wheezing or rhonchi  ABDOMEN: Soft,  non-tender, non-distended EXTREMITIES:  No edema; No deformity   ASSESSMENT AND PLAN: .   Atrial fibrillation Hypertension Hyperlipidemia    {Are you ordering a CV Procedure (e.g. stress test, cath, DCCV, TEE, etc)?   Press F2        :[id number]}  Dispo: ***  Signed, Sharlene Dory, PA-C

## 2023-07-18 DIAGNOSIS — M1712 Unilateral primary osteoarthritis, left knee: Secondary | ICD-10-CM

## 2023-07-18 DIAGNOSIS — M24512 Contracture, left shoulder: Secondary | ICD-10-CM

## 2023-07-18 DIAGNOSIS — M1711 Unilateral primary osteoarthritis, right knee: Secondary | ICD-10-CM

## 2023-07-18 LAB — BASIC METABOLIC PANEL
Anion gap: 8 (ref 5–15)
BUN: 40 mg/dL — ABNORMAL HIGH (ref 8–23)
CO2: 18 mmol/L — ABNORMAL LOW (ref 22–32)
Calcium: 7.9 mg/dL — ABNORMAL LOW (ref 8.9–10.3)
Chloride: 110 mmol/L (ref 98–111)
Creatinine, Ser: 3.61 mg/dL — ABNORMAL HIGH (ref 0.61–1.24)
GFR, Estimated: 17 mL/min — ABNORMAL LOW (ref >60)
Glucose, Bld: 125 mg/dL — ABNORMAL HIGH (ref 70–99)
Potassium: 3.8 mmol/L (ref 3.5–5.1)
Sodium: 136 mmol/L (ref 135–145)

## 2023-07-18 LAB — GLUCOSE, CAPILLARY
Glucose-Capillary: 114 mg/dL — ABNORMAL HIGH (ref 70–99)
Glucose-Capillary: 137 mg/dL — ABNORMAL HIGH (ref 70–99)
Glucose-Capillary: 233 mg/dL — ABNORMAL HIGH (ref 70–99)

## 2023-07-18 NOTE — Plan of Care (Signed)
  Problem: Consults Goal: RH GENERAL PATIENT EDUCATION Description: See Patient Education module for education specifics. Outcome: Progressing   Problem: RH BOWEL ELIMINATION Goal: RH STG MANAGE BOWEL WITH ASSISTANCE Description: STG Manage Bowel withtoileting Assistance. Outcome: Progressing Flowsheets (Taken 07/18/2023 1719) STG: Pt will manage bowels with assistance: 2-Maximum assistance   Problem: RH BLADDER ELIMINATION Goal: RH STG MANAGE BLADDER WITH ASSISTANCE Description: STG Manage Bladder With equipment/min Assistance Outcome: Not Progressing   Problem: RH SKIN INTEGRITY Goal: RH STG SKIN FREE OF INFECTION/BREAKDOWN Description: Maintain skin free of infection with min assist Outcome: Progressing   Problem: RH SAFETY Goal: RH STG ADHERE TO SAFETY PRECAUTIONS W/ASSISTANCE/DEVICE Description: STG Adhere to Safety Precautions With cues Assistance/Device. Outcome: Progressing   Problem: RH PAIN MANAGEMENT Goal: RH STG PAIN MANAGED AT OR BELOW PT'S PAIN GOAL Description: < 4 with prns Outcome: Progressing   Problem: RH KNOWLEDGE DEFICIT GENERAL Goal: RH STG INCREASE KNOWLEDGE OF SELF CARE AFTER HOSPITALIZATION Description: Patient and spouse will be able to manage care using educational resources for medications and dietary modification independently Outcome: Progressing

## 2023-07-18 NOTE — Progress Notes (Signed)
 PROGRESS NOTE   Subjective/Complaints:  Knee pain ok, has chronic limitation left shoulder No abd pain, stools "firming up"" last one type6 Remains on ICP  ROS: Patient denies abd pain , no breathing problems, no N?V Objective:   No results found.  Recent Labs    07/17/23 0856  WBC 13.4*  HGB 9.1*  HCT 30.9*  PLT 489*   Recent Labs    07/17/23 0856 07/18/23 0534  NA 136 136  K 3.7 3.8  CL 109 110  CO2 19* 18*  GLUCOSE 88 125*  BUN 42* 40*  CREATININE 3.88* 3.61*  CALCIUM 8.6* 7.9*    Intake/Output Summary (Last 24 hours) at 07/18/2023 0655 Last data filed at 07/18/2023 0315 Gross per 24 hour  Intake 118 ml  Output 1350 ml  Net -1232 ml     Pressure Injury 07/10/23 Buttocks Right;Mid;Upper Stage 2 -  Partial thickness loss of dermis presenting as a shallow open injury with a red, pink wound bed without slough. Pink opened stage 2 right upper buttock (Active)  07/10/23 1608  Location: Buttocks  Location Orientation: Right;Mid;Upper  Staging: Stage 2 -  Partial thickness loss of dermis presenting as a shallow open injury with a red, pink wound bed without slough.  Wound Description (Comments): Pink opened stage 2 right upper buttock  Present on Admission: Yes     Pressure Injury 07/10/23 Buttocks Right;Mid;Upper Stage 2 -  Partial thickness loss of dermis presenting as a shallow open injury with a red, pink wound bed without slough. Pink stage 2 directly beside the right upper stage 2 (Active)  07/10/23 1609  Location: Buttocks  Location Orientation: Right;Mid;Upper  Staging: Stage 2 -  Partial thickness loss of dermis presenting as a shallow open injury with a red, pink wound bed without slough.  Wound Description (Comments): Pink stage 2 directly beside the right upper stage 2  Present on Admission: Yes     Pressure Injury 07/10/23 Buttocks Right;Mid;Lower Stage 2 -  Partial thickness loss of dermis  presenting as a shallow open injury with a red, pink wound bed without slough. Pink stage 2 to right buttock below the upper stage 2 (Active)  07/10/23 1610  Location: Buttocks  Location Orientation: Right;Mid;Lower  Staging: Stage 2 -  Partial thickness loss of dermis presenting as a shallow open injury with a red, pink wound bed without slough.  Wound Description (Comments): Pink stage 2 to right buttock below the upper stage 2  Present on Admission: Yes     Pressure Injury 07/10/23 Buttocks Left;Mid;Lower Stage 2 -  Partial thickness loss of dermis presenting as a shallow open injury with a red, pink wound bed without slough. Pink stage 2 to left buttock (Active)  07/10/23 1611  Location: Buttocks  Location Orientation: Left;Mid;Lower  Staging: Stage 2 -  Partial thickness loss of dermis presenting as a shallow open injury with a red, pink wound bed without slough.  Wound Description (Comments): Pink stage 2 to left buttock  Present on Admission: Yes    Physical Exam: Vital Signs Blood pressure 139/75, pulse 80, temperature 98 F (36.7 C), temperature source Oral, resp. rate 18, height 5\' 9"  (1.753 m),  weight 102.9 kg, SpO2 99%.  General: No acute distress Mood and affect are appropriate Heart: Regular rate and rhythm no rubs murmurs or extra sounds Lungs: Clear to auscultation, breathing unlabored, no rales or wheezes Abdomen: Positive bowel sounds, soft nontender to palpation, nondistended Extremities: No clubbing, cyanosis, or edema Skin: No evidence of breakdown, no evidence of rash Neurologic: Cranial nerves II through XII intact, motor strength is 4/5 in rght 3- left  deltoid, bicep, tricep, grip,3- RIght 4- left  hip flexor, knee extensors, 4/4 B ankle dorsiflexor and plantar flexor   -Buttocks MASD--not visualized this am     MSK:    Both knees with effusions, tight. Left shoulder tight in ER/IR/F/E  Pain more with IR/ER than any movement. Was able to abduct to 90+  degrees            Assessment/Plan: 1. Functional deficits which require 3+ hours per day of interdisciplinary therapy in a comprehensive inpatient rehab setting. Physiatrist is providing close team supervision and 24 hour management of active medical problems listed below. Physiatrist and rehab team continue to assess barriers to discharge/monitor patient progress toward functional and medical goals  Care Tool:  Bathing  Bathing activity did not occur:  (patient completed a simulated task at bed LOF) Body parts bathed by patient: Right arm, Left arm, Chest, Abdomen, Front perineal area, Right upper leg, Left upper leg, Face   Body parts bathed by helper: Buttocks, Left lower leg, Right lower leg     Bathing assist Assist Level: Maximal Assistance - Patient 24 - 49%     Upper Body Dressing/Undressing Upper body dressing   What is the patient wearing?: Pull over shirt    Upper body assist Assist Level: Minimal Assistance - Patient > 75%    Lower Body Dressing/Undressing Lower body dressing      What is the patient wearing?: Pants, Underwear/pull up, Incontinence brief     Lower body assist Assist for lower body dressing: Maximal Assistance - Patient 25 - 49%     Toileting Toileting    Toileting assist Assist for toileting: Dependent - Patient 0%     Transfers Chair/bed transfer  Transfers assist  Chair/bed transfer activity did not occur: Safety/medical concerns (not safe to get up)  Chair/bed transfer assist level: 2 Helpers (slide board.)     Locomotion Ambulation   Ambulation assist   Ambulation activity did not occur: Safety/medical concerns          Walk 10 feet activity   Assist  Walk 10 feet activity did not occur: Safety/medical concerns        Walk 50 feet activity   Assist Walk 50 feet with 2 turns activity did not occur: Safety/medical concerns         Walk 150 feet activity   Assist Walk 150 feet activity did not occur:  Safety/medical concerns         Walk 10 feet on uneven surface  activity   Assist Walk 10 feet on uneven surfaces activity did not occur: Safety/medical concerns         Wheelchair     Assist Is the patient using a wheelchair?: Yes Type of Wheelchair: Manual    Wheelchair assist level: Dependent - Patient 0% Max wheelchair distance: 150'    Wheelchair 50 feet with 2 turns activity    Assist        Assist Level: Dependent - Patient 0%   Wheelchair 150 feet activity  Assist      Assist Level: Dependent - Patient 0%   Blood pressure 139/75, pulse 80, temperature 98 F (36.7 C), temperature source Oral, resp. rate 18, height 5\' 9"  (1.753 m), weight 102.9 kg, SpO2 99%.   Medical Problem List and Plan: 1. Functional deficits secondary to debility related to septic shock/C. difficile colitis/AKI superimposed on CKD stage IV             -patient may shower with tubes/drains covered             -ELOS/Goals:  3/28, min assist PT/OT             -Continue CIR therapies including PT, OT     2.  Antithrombotics: -DVT/anticoagulation:  Pharmaceutical: Eliquis             -antiplatelet therapy: N/A   3. Pain Management: tramadol prn, Zanaflex 4 mg twice daily as needed   Biliateral OA of knees  -ordered bilateral neoprine knee sleeves --try today  -voltaren gel tid to knees  -have scheduled tramadol 50mg  at 0700 and 1200 daily. Continue prn as well  Mild adhesive capsulitis left shoulder---added sports cream. Can use voltaren gel also. Aggressive ROM with therapy and while in bed 4. Mood/Behavior/Sleep: Trazodone 50 mg nightly as needed.  Provide emotional support             -antipsychotic agents: N/A   5. Neuropsych/cognition: This patient is capable of making decisions on his own behalf.   6. Skin/Wound Care: Routine skin checks   7. Fluids/Electrolytes/Nutrition:    -3/14 po intake remains poor, albumin low, weights falling  -will ask RD for  assistance. Have spoken to patient about intake  -?IV albumin (see below)  -CMET monday 8.  Oliguric AKI superimposed on CKD stage IV.  Baseline creatinine 4.9.  CRRT discontinued 2/25.  No current plan for long-term dialysis.  Follow-up renal services              - Per discussion with nephrology Dr. Verna Czech, keep Foley catheter in to allow accurate I's and O's through Monday; then, can remove and do DC Foley trial while continuing to document strict outputs             - Continue daily assessments of renal function per nephrology   3/8- per renal, Cr leveling out- and con't Foley- will remove Monday  -nephrology following, Cr remains in 4's.   3/14 renal function with some improvement today     -suspect lower extremity edema is related to renal function/low albumin   Appreciate nephro input  9.  C. difficile colitis.  Completed course of oral vancomycin 3/7.  DC'd IV Flagyl 3/3. Enteric precautions   3/14 stools formed this morning 10.  Status post left nephrostomy tube d/t staghorn calculi. Has Foley Nephrostomy tube placed 1/29 per IR with plan for ureteric stent placement 3/21              - Careful monitoring of nephrostomy output  -3/14 flomax has been added to improve emptying   -spoke to Dr. Mena Goes today who will perform endoscopy on 3/21 to initially break up calculi/ remove nephrostomy tube. No stent apparently    -he requested prophylactic keflex 250mg  bid beginning 3/17    Still on enteric prec 11.  Chronic atrial fibrillation with episodes of bradycardia.  Followed by cardiology services.  Continue Eliquis.  Avoid AV nodal blocking agents   12.  Diabetes mellitus.  Hemoglobin A1c 6.2.    -  tightly controlled. Dc SSI and change cbg checks to qam only   CBG (last 3)  Recent Labs    07/17/23 0627 07/17/23 2107 07/18/23 0628  GLUCAP 85 161* 114*    Controlled diet    13.  Hypotension.  ProAmatine 10 mg 3 times daily.  Monitor with increased mobility   - Normotensive on  admission  3/9- BP running 100-s to 120s systolic- con't regimen 14.  Chronic anemia.  Niferex daily/Aranesp.  Monitor for any bleeding episodes   3/8- Hb 7.1 today- will order transfusion- transfusion of 1 unit pRBCs.   3/14 hgb up to 9.1    16. Leukocytosis  3/9- Pt's WBC is 21.7- up from 16k- is afebrile- due to his recent C diff, that just finished correct dosing of ABX for, called ID for guidance on treatment- don't want to cause more Cdiff- ID consulted ---no new orders  3/10 WBC's down to 17k  --have been hovering above and below 20 for awhile. No new clinical symptoms. Nephrostomy drainage clear, yellow  3/14 wbc's down to 13k.--recheck Monday       LOS: 8 days A FACE TO FACE EVALUATION WAS PERFORMED  Erick Colace 07/18/2023, 6:55 AM

## 2023-07-18 NOTE — Progress Notes (Signed)
 [facility] NEPHROLOGY PROGRESS NOTE  Assessment/ Plan: Pt is a 73 y.o. yo male    # AKI on CKD 4 - AKI is ischemic and pre-renal insults related to hypotension +/- ARB and intravasc vol depletion.  CT w/o obstruction, has L neph tube.  He was on CRRT from 2/22 - 2/25.  Note that Cr 4.06- 5.3 from jan 2025, eGFR 11-15 ml/min.  For reference in 01/2022 with Cr 2.48 from Care Everywhere Cont holding HD and watching labs and UOP; creatinine is leveling trending down, increasing urine output. No vascular access currenlty Removed foley catheter, has PCN No Need for HD today. Have made great strides the last 4 days in volume and clearance  BP/volume-  still overall overloaded but have made progress-  O's greater than I's-  UOP down a little from the past 2 days-  no diuretic-  will watch and add on diuretic if needed   #L nephrostomy tube - placed in Jan 2025 by IR, and is f/b urology. Has staghorn calculi on the L and had previously been scheduled to have stone removed and stent placed per his wife on March 7th at [facility]. Postponed #Urinary retention - as above, per CIR #Chronic atrial fib - on eliquis; per primary team  #Hypophosphatemia secondary to CRRT - resolved #Hypokalemia -  resolved #Anemia normocytic: cont ESA; PRBC's per primary team  Subjective: No new event.  Urine output 1.3 L which is a little less.  Denies nausea, vomiting, chest pain, shortness of breath.   over the last 5 days crt has improved from 5.1 to 3.6  Objective Vital signs in last 24 hours: Vitals:   07/17/23 0500 07/17/23 1953 07/18/23 0320 07/18/23 0329  BP: 133/80 (!) 143/78 139/75   Pulse: 89 96 80   Resp: 18 18 18    Temp: 98 F (36.7 C) (!) 97.5 F (36.4 C) 98 F (36.7 C)   TempSrc: Oral Oral Oral   SpO2:  100% 99%   Weight:    102.9 kg  Height:       Weight change:   Intake/Output Summary (Last 24 hours) at 07/18/2023 1155 Last data filed at 07/18/2023 0315 Gross per 24 hour   Intake 118 ml  Output 850 ml  Net -732 ml       Labs: RENAL PANEL Recent Labs  Lab 07/14/23 0507 07/15/23 0504 07/16/23 0606 07/17/23 0856 07/18/23 0534  NA 131* 131* 134* 136 136  K 4.1 3.7 4.2 3.7 3.8  CL 106 104 107 109 110  CO2 19* 20* 18* 19* 18*  GLUCOSE 105* 91 80 88 125*  BUN 54* 53* 49* 42* 40*  CREATININE 4.91* 4.75* 4.27* 3.88* 3.61*  CALCIUM 7.5* 8.1* 8.2* 8.6* 7.9*    Liver Function Tests: No results for input(s): "AST", "ALT", "ALKPHOS", "BILITOT", "PROT", "ALBUMIN" in the last 168 hours.  No results for input(s): "LIPASE", "AMYLASE" in the last 168 hours. No results for input(s): "AMMONIA" in the last 168 hours. CBC: Recent Labs    06/03/23 0647 06/04/23 0503 06/04/23 1126 06/05/23 3664 07/03/23 1514 07/04/23 0140 07/11/23 4034 07/12/23 0731 07/13/23 0635 07/15/23 0504 07/17/23 0856  HGB 10.2*   < >  --    < >  --    < > 7.1* 8.8* 8.2* 8.1* 9.1*  MCV 85.5   < >  --    < >  --    < > 90.1 89.3 89.3 88.5 90.4  FERRITIN  --   --  32  --  88  --   --   --   --   --   --   TIBC 384  --   --   --  179*  --   --   --   --   --   --   IRON 45  --   --   --  35*  --   --   --   --   --   --    < > = values in this interval not displayed.    Cardiac Enzymes: No results for input(s): "CKTOTAL", "CKMB", "CKMBINDEX", "TROPONINI" in the last 168 hours. CBG: Recent Labs  Lab 07/16/23 0603 07/16/23 0635 07/17/23 0627 07/17/23 2107 07/18/23 0628  GLUCAP 66* 100* 85 161* 114*    Iron Studies: No results for input(s): "IRON", "TIBC", "TRANSFERRIN", "FERRITIN" in the last 72 hours. Studies/Results: No results found.  Medications: Infusions:   Scheduled Medications:  apixaban  5 mg Oral BID   [START ON 07/20/2023] cephALEXin  250 mg Oral BID   darbepoetin (ARANESP) injection - NON-DIALYSIS  40 mcg Subcutaneous Q Fri-1800   diclofenac Sodium  2 g Topical QID   iron polysaccharides  150 mg Oral Daily   midodrine  10 mg Oral TID WC    multivitamin  1 tablet Oral QHS   Muscle Rub   Topical TID   tamsulosin  0.4 mg Oral QPC supper   traMADol  50 mg Oral BID    have reviewed scheduled and prn medications.  Physical Exam: General:NAD, comfortable Heart:RRR, s1s2 nl Lungs:clear b/l, no crackle Abdomen:soft, Non-tender, non-distended Extremities:has edema but looks good with compression hose on  Dialysis Access: None.  Rahel Carlton A Dalyce Renne 07/18/2023,11:55 AM  LOS: 8 days

## 2023-07-19 LAB — URINALYSIS, ROUTINE W REFLEX MICROSCOPIC
Bacteria, UA: NONE SEEN
Bilirubin Urine: NEGATIVE
Glucose, UA: 150 mg/dL — AB
Ketones, ur: NEGATIVE mg/dL
Nitrite: NEGATIVE
Protein, ur: 100 mg/dL — AB
RBC / HPF: >50 RBC/hpf (ref 0–5)
Specific Gravity, Urine: 1.011 (ref 1.005–1.030)
WBC, UA: >50 WBC/hpf (ref 0–5)
pH: 6 (ref 5.0–8.0)

## 2023-07-19 LAB — RENAL FUNCTION PANEL
Albumin: 1.9 g/dL — ABNORMAL LOW (ref 3.5–5.0)
Anion gap: 6 (ref 5–15)
BUN: 37 mg/dL — ABNORMAL HIGH (ref 8–23)
CO2: 21 mmol/L — ABNORMAL LOW (ref 22–32)
Calcium: 8.2 mg/dL — ABNORMAL LOW (ref 8.9–10.3)
Chloride: 110 mmol/L (ref 98–111)
Creatinine, Ser: 3.36 mg/dL — ABNORMAL HIGH (ref 0.61–1.24)
GFR, Estimated: 19 mL/min — ABNORMAL LOW (ref >60)
Glucose, Bld: 113 mg/dL — ABNORMAL HIGH (ref 70–99)
Phosphorus: 4.9 mg/dL — ABNORMAL HIGH (ref 2.5–4.6)
Potassium: 3.8 mmol/L (ref 3.5–5.1)
Sodium: 137 mmol/L (ref 135–145)

## 2023-07-19 LAB — GLUCOSE, CAPILLARY
Glucose-Capillary: 119 mg/dL — ABNORMAL HIGH (ref 70–99)
Glucose-Capillary: 147 mg/dL — ABNORMAL HIGH (ref 70–99)

## 2023-07-19 NOTE — Progress Notes (Signed)
 Nephrostomy bag drained this afternoon. Color of fluids is maroon like red color but see through. Nephrostomy drain dressing changed. Area of skin around the tube is red in color. Area cleaned before putting new dressing over site.

## 2023-07-19 NOTE — Progress Notes (Signed)
 Occupational Therapy Session Note  Patient Details  Name: Calvin Schultz MRN: [id number] Date of Birth: 16-Aug-1950  Today's Date: 07/19/2023 OT Individual Time: 4098-1191 OT Individual Time Calculation (min): 60 min    Short Term Goals: Week 1:  OT Short Term Goal 1 (Week 1): The pt will transfer from supine in bed to chair LOF with ModA using a sliding board x2 at 95% safe. OT Short Term Goal 2 (Week 1): The pt will complete UB dressing with s/u A at 95% safe OT Short Term Goal 3 (Week 1): The pt will complete LB bathing with  MinA incorporating AE at 95% safe OT Short Term Goal 4 (Week 1): The pt will complete LB dressing with MinA incorporating AE at 95% safe OT Short Term Goal 5 (Week 1): The pt will tolerate > 45 minutes of OT activity at 95% safe.  Skilled Therapeutic Interventions/Progress Updates:      Therapy Documentation Precautions:  Precautions Precautions: Fall Precaution/Restrictions Comments: L nephrostomy tube, foley catheter Restrictions Weight Bearing Restrictions Per Provider Order: No General: "That was great!" Pt supine in bed upon OT arrival, agreeable to OT session. Wife present for session  Pain:  unrated pain reported in knees and Lt shoulder, activity, intermittent rest breaks, distractions provided for pain management, pt reports tolerable to proceed. Heat packs given at end of session for pain relief  ADL: OT providing skilled therapeutic intervention for increased indepenence with bed mobility, standing and activity tolerance. Pt completed bed mobility with Min A +1 for assistance for trunk management, pt able to manage BLE off of bed. Pt seated EOB SBA for increased time engaging in conversation with OT with no LOB/SOB. OT instructing Pt to stand in stedy with increased weight bearing into BLE and increase independence with transfers. Pt completed 5x sit to stands with Max A+1 using BUE on stedy to assist into standing. Pt completing ~1:00 stand on each  trial. Pt fading to Mod-Min A within stedy for static standing.  Other Treatments: OT providing pt with therapeutic use of self for rapport building and pt goals for therapy.   Pt seated in W/C at end of session with W/C alarm donned, call light within reach and 4Ps assessed. Wife present.   Therapy/Group: Individual Therapy  Velia Meyer, OTD, OTR/L 07/19/2023, 4:44 PM

## 2023-07-19 NOTE — Plan of Care (Signed)
  Problem: Consults Goal: RH GENERAL PATIENT EDUCATION Description: See Patient Education module for education specifics. Outcome: Progressing   Problem: RH BOWEL ELIMINATION Goal: RH STG MANAGE BOWEL WITH ASSISTANCE Description: STG Manage Bowel withtoileting Assistance. Outcome: Progressing   Problem: RH BLADDER ELIMINATION Goal: RH STG MANAGE BLADDER WITH ASSISTANCE Description: STG Manage Bladder With equipment/min Assistance Outcome: Not Progressing   Problem: RH SKIN INTEGRITY Goal: RH STG SKIN FREE OF INFECTION/BREAKDOWN Description: Maintain skin free of infection with min assist Outcome: Progressing   Problem: RH SAFETY Goal: RH STG ADHERE TO SAFETY PRECAUTIONS W/ASSISTANCE/DEVICE Description: STG Adhere to Safety Precautions With cues Assistance/Device. Outcome: Progressing   Problem: RH PAIN MANAGEMENT Goal: RH STG PAIN MANAGED AT OR BELOW PT'S PAIN GOAL Description: < 4 with prns Outcome: Progressing

## 2023-07-19 NOTE — Progress Notes (Signed)
 PROGRESS NOTE   Subjective/Complaints:  Appreciate renal note , no therapy yesterday but has several sessions today , was up in chair 3 h yesterdya  ROS: Patient denies abd pain , no breathing problems, no N/V Objective:   No results found.  Recent Labs    07/17/23 0856  WBC 13.4*  HGB 9.1*  HCT 30.9*  PLT 489*   Recent Labs    07/17/23 0856 07/18/23 0534  NA 136 136  K 3.7 3.8  CL 109 110  CO2 19* 18*  GLUCOSE 88 125*  BUN 42* 40*  CREATININE 3.88* 3.61*  CALCIUM 8.6* 7.9*    Intake/Output Summary (Last 24 hours) at 07/19/2023 1610 Last data filed at 07/19/2023 0300 Gross per 24 hour  Intake 360 ml  Output 1616 ml  Net -1256 ml     Pressure Injury 07/10/23 Buttocks Right;Mid;Upper Stage 2 -  Partial thickness loss of dermis presenting as a shallow open injury with a red, pink wound bed without slough. Pink opened stage 2 right upper buttock (Active)  07/10/23 1608  Location: Buttocks  Location Orientation: Right;Mid;Upper  Staging: Stage 2 -  Partial thickness loss of dermis presenting as a shallow open injury with a red, pink wound bed without slough.  Wound Description (Comments): Pink opened stage 2 right upper buttock  Present on Admission: Yes     Pressure Injury 07/10/23 Buttocks Right;Mid;Upper Stage 2 -  Partial thickness loss of dermis presenting as a shallow open injury with a red, pink wound bed without slough. Pink stage 2 directly beside the right upper stage 2 (Active)  07/10/23 1609  Location: Buttocks  Location Orientation: Right;Mid;Upper  Staging: Stage 2 -  Partial thickness loss of dermis presenting as a shallow open injury with a red, pink wound bed without slough.  Wound Description (Comments): Pink stage 2 directly beside the right upper stage 2  Present on Admission: Yes     Pressure Injury 07/10/23 Buttocks Right;Mid;Lower Stage 2 -  Partial thickness loss of dermis presenting as  a shallow open injury with a red, pink wound bed without slough. Pink stage 2 to right buttock below the upper stage 2 (Active)  07/10/23 1610  Location: Buttocks  Location Orientation: Right;Mid;Lower  Staging: Stage 2 -  Partial thickness loss of dermis presenting as a shallow open injury with a red, pink wound bed without slough.  Wound Description (Comments): Pink stage 2 to right buttock below the upper stage 2  Present on Admission: Yes     Pressure Injury 07/10/23 Buttocks Left;Mid;Lower Stage 2 -  Partial thickness loss of dermis presenting as a shallow open injury with a red, pink wound bed without slough. Pink stage 2 to left buttock (Active)  07/10/23 1611  Location: Buttocks  Location Orientation: Left;Mid;Lower  Staging: Stage 2 -  Partial thickness loss of dermis presenting as a shallow open injury with a red, pink wound bed without slough.  Wound Description (Comments): Pink stage 2 to left buttock  Present on Admission: Yes    Physical Exam: Vital Signs Blood pressure 125/65, pulse 65, temperature 97.8 F (36.6 C), temperature source Oral, resp. rate 18, height 5\' 9"  (1.753 m),  weight 102.8 kg, SpO2 100%.  General: No acute distress Mood and affect are appropriate Heart: Regular rate and rhythm no rubs murmurs or extra sounds Lungs: Clear to auscultation, breathing unlabored, no rales or wheezes Abdomen: Positive bowel sounds, soft nontender to palpation, nondistended Extremities: No clubbing, cyanosis, or edema Skin: No evidence of breakdown, no evidence of rash Neurologic: Cranial nerves II through XII intact, motor strength is 4/5 in rght 3- left  deltoid, bicep, tricep, grip,3- RIght 4- left  hip flexor, knee extensors, 4/4 B ankle dorsiflexor and plantar flexor   -Buttocks MASD--not visualized this am     MSK:    Both knees with effusions, tight. Left shoulder tight in ER/IR/F/E  Pain more with IR/ER than any movement. Was able to abduct to 90+ degrees             Assessment/Plan: 1. Functional deficits which require 3+ hours per day of interdisciplinary therapy in a comprehensive inpatient rehab setting. Physiatrist is providing close team supervision and 24 hour management of active medical problems listed below. Physiatrist and rehab team continue to assess barriers to discharge/monitor patient progress toward functional and medical goals  Care Tool:  Bathing  Bathing activity did not occur:  (patient completed a simulated task at bed LOF) Body parts bathed by patient: Right arm, Left arm, Chest, Abdomen, Front perineal area, Right upper leg, Left upper leg, Face   Body parts bathed by helper: Buttocks, Left lower leg, Right lower leg     Bathing assist Assist Level: Maximal Assistance - Patient 24 - 49%     Upper Body Dressing/Undressing Upper body dressing   What is the patient wearing?: Pull over shirt    Upper body assist Assist Level: Minimal Assistance - Patient > 75%    Lower Body Dressing/Undressing Lower body dressing      What is the patient wearing?: Pants, Underwear/pull up, Incontinence brief     Lower body assist Assist for lower body dressing: Maximal Assistance - Patient 25 - 49%     Toileting Toileting    Toileting assist Assist for toileting: Dependent - Patient 0%     Transfers Chair/bed transfer  Transfers assist  Chair/bed transfer activity did not occur: Safety/medical concerns (not safe to get up)  Chair/bed transfer assist level: 2 Helpers (slide board.)     Locomotion Ambulation   Ambulation assist   Ambulation activity did not occur: Safety/medical concerns          Walk 10 feet activity   Assist  Walk 10 feet activity did not occur: Safety/medical concerns        Walk 50 feet activity   Assist Walk 50 feet with 2 turns activity did not occur: Safety/medical concerns         Walk 150 feet activity   Assist Walk 150 feet activity did not occur: Safety/medical  concerns         Walk 10 feet on uneven surface  activity   Assist Walk 10 feet on uneven surfaces activity did not occur: Safety/medical concerns         Wheelchair     Assist Is the patient using a wheelchair?: Yes Type of Wheelchair: Manual    Wheelchair assist level: Dependent - Patient 0% Max wheelchair distance: 150'    Wheelchair 50 feet with 2 turns activity    Assist        Assist Level: Dependent - Patient 0%   Wheelchair 150 feet activity  Assist      Assist Level: Dependent - Patient 0%   Blood pressure 125/65, pulse 65, temperature 97.8 F (36.6 C), temperature source Oral, resp. rate 18, height 5\' 9"  (1.753 m), weight 102.8 kg, SpO2 100%.   Medical Problem List and Plan: 1. Functional deficits secondary to debility related to septic shock/C. difficile colitis/AKI superimposed on CKD stage IV             -patient may shower with tubes/drains covered             -ELOS/Goals:  3/28, min assist PT/OT             -Continue CIR therapies including PT, OT     2.  Antithrombotics: -DVT/anticoagulation:  Pharmaceutical: Eliquis             -antiplatelet therapy: N/A   3. Pain Management: tramadol prn, Zanaflex 4 mg twice daily as needed   Biliateral OA of knees  -ordered bilateral neoprine knee sleeves --try today  -voltaren gel tid to knees  -have scheduled tramadol 50mg  at 0700 and 1200 daily. Continue prn as well  Mild adhesive capsulitis left shoulder---added sports cream. Can use voltaren gel also. Aggressive ROM with therapy and while in bed 4. Mood/Behavior/Sleep: Trazodone 50 mg nightly as needed.  Provide emotional support             -antipsychotic agents: N/A   5. Neuropsych/cognition: This patient is capable of making decisions on his own behalf.   6. Skin/Wound Care: Routine skin checks   7. Fluids/Electrolytes/Nutrition:    -3/14 po intake remains poor, albumin low, weights falling  -will ask RD for assistance. Have  spoken to patient about intake  -?IV albumin (see below)  -CMET monday 8.  Oliguric AKI superimposed on CKD stage IV.  Baseline creatinine 4.9.  CRRT discontinued 2/25.  No current plan for long-term dialysis.  Follow-up renal services              - Per discussion with nephrology Dr. Verna Czech, keep Foley catheter in to allow accurate I's and O's through Monday; then, can remove and do DC Foley trial while continuing to document strict outputs             - Continue daily assessments of renal function per nephrology   3/8- per renal, Cr leveling out- and con't Foley- will remove Monday  -nephrology following, Cr remains in 4's.   3/14 renal function with some improvement today     -suspect lower extremity edema is related to renal function/low albumin   Appreciate nephro input  9.  C. difficile colitis.  Completed course of oral vancomycin 3/7.  DC'd IV Flagyl 3/3. Enteric precautions   3/14 stools formed this morning 10.  Status post left nephrostomy tube d/t staghorn calculi. Has Foley Nephrostomy tube placed 1/29 per IR with plan for ureteric stent placement 3/21              - Careful monitoring of nephrostomy output  -3/14 flomax has been added to improve emptying   -spoke to Dr. Mena Goes today who will perform endoscopy on 3/21 to initially break up calculi/ remove nephrostomy tube. No stent apparently    -he requested prophylactic keflex 250mg  bid beginning 3/17    Still on enteric prec 11.  Chronic atrial fibrillation with episodes of bradycardia.  Followed by cardiology services.  Continue Eliquis.  Avoid AV nodal blocking agents   12.  Diabetes mellitus.  Hemoglobin A1c 6.2.    -  tightly controlled. Dc SSI and change cbg checks to qam only   CBG (last 3)  Recent Labs    07/18/23 1206 07/18/23 2058 07/19/23 0607  GLUCAP 137* 233* 119*    Controlled diet 3/16    13.  Hypotension.  ProAmatine 10 mg 3 times daily.  Monitor with increased mobility   - Normotensive on  admission  3/9- BP running 100-s to 120s systolic- con't regimen 14.  Chronic anemia.  Niferex daily/Aranesp.  Monitor for any bleeding episodes   3/8- Hb 7.1 today- will order transfusion- transfusion of 1 unit pRBCs.   3/14 hgb up to 9.1    16. Leukocytosis  3/9- Pt's WBC is 21.7- up from 16k- is afebrile- due to his recent C diff, that just finished correct dosing of ABX for, called ID for guidance on treatment- don't want to cause more Cdiff- ID consulted ---no new orders  3/10 WBC's down to 17k  --have been hovering above and below 20 for awhile. No new clinical symptoms. Nephrostomy drainage clear, yellow  3/14 wbc's down to 13k.--recheck Monday       LOS: 9 days A FACE TO FACE EVALUATION WAS PERFORMED  Erick Colace 07/19/2023, 6:43 AM

## 2023-07-19 NOTE — Progress Notes (Signed)
 [facility] NEPHROLOGY PROGRESS NOTE  Assessment/ Plan: Pt is a 73 y.o. yo male    # AKI on CKD 4 - AKI is ischemic and pre-renal insults related to hypotension +/- ARB and intravasc vol depletion.  CT w/o obstruction, has L neph tube.  He was on CRRT from 2/22 - 2/25.  Note that Cr 4.06- 5.3 from jan 2025, eGFR 11-15 ml/min.  For reference in 01/2022 with Cr 2.48 from Care Everywhere Cont holding HD and watching labs and UOP; creatinine is leveling trending down, increasing urine output. No vascular access currenlty Removed foley catheter, has PCN No Need for HD today. Have made great strides the last 4 days in volume and clearance  BP/volume-  still overall overloaded but have made progress-  O's greater than I's-  UOP down a little from the past 2 days-  no diuretic-  will watch and add on diuretic if needed   #L nephrostomy tube - placed in Jan 2025 by IR, and is f/b urology. Has staghorn calculi on the L and had previously been scheduled to have stone removed and stent placed per his wife on March 7th at [facility]. Postponed #Urinary retention - as above, per CIR #Chronic atrial fib - on eliquis; per primary team  #Hypophosphatemia secondary to CRRT - resolved #Hypokalemia -  resolved #Anemia normocytic: cont ESA; PRBC's per primary team  Subjective: No new event.  Urine output 1.6 L .  Denies nausea, vomiting, chest pain, shortness of breath.   over the last 6 days crt has improved from 5.1 to 3.3  Objective Vital signs in last 24 hours: Vitals:   07/18/23 1400 07/18/23 1945 07/19/23 0342 07/19/23 0600  BP: 136/89 (!) 150/65 125/65   Pulse: 64 63 65   Resp: 19 18 18    Temp: 97.9 F (36.6 C) 97.9 F (36.6 C) 97.8 F (36.6 C)   TempSrc: Oral Oral Oral   SpO2: 100% 100% 100%   Weight:    102.8 kg  Height:       Weight change: -0.1 kg  Intake/Output Summary (Last 24 hours) at 07/19/2023 1116 Last data filed at 07/19/2023 0300 Gross per 24 hour  Intake 360  ml  Output 1616 ml  Net -1256 ml       Labs: RENAL PANEL Recent Labs  Lab 07/15/23 0504 07/16/23 0606 07/17/23 0856 07/18/23 0534 07/19/23 0615  NA 131* 134* 136 136 137  K 3.7 4.2 3.7 3.8 3.8  CL 104 107 109 110 110  CO2 20* 18* 19* 18* 21*  GLUCOSE 91 80 88 125* 113*  BUN 53* 49* 42* 40* 37*  CREATININE 4.75* 4.27* 3.88* 3.61* 3.36*  CALCIUM 8.1* 8.2* 8.6* 7.9* 8.2*  PHOS  --   --   --   --  4.9*  ALBUMIN  --   --   --   --  1.9*    Liver Function Tests: Recent Labs  Lab 07/19/23 0615  ALBUMIN 1.9*    No results for input(s): "LIPASE", "AMYLASE" in the last 168 hours. No results for input(s): "AMMONIA" in the last 168 hours. CBC: Recent Labs    06/03/23 0647 06/04/23 0503 06/04/23 1126 06/05/23 1610 07/03/23 1514 07/04/23 0140 07/11/23 9604 07/12/23 0731 07/13/23 5409 07/15/23 0504 07/17/23 0856  HGB 10.2*   < >  --    < >  --    < > 7.1* 8.8* 8.2* 8.1* 9.1*  MCV 85.5   < >  --    < >  --    < >  90.1 89.3 89.3 88.5 90.4  FERRITIN  --   --  32  --  88  --   --   --   --   --   --   TIBC 384  --   --   --  179*  --   --   --   --   --   --   IRON 45  --   --   --  35*  --   --   --   --   --   --    < > = values in this interval not displayed.    Cardiac Enzymes: No results for input(s): "CKTOTAL", "CKMB", "CKMBINDEX", "TROPONINI" in the last 168 hours. CBG: Recent Labs  Lab 07/17/23 2107 07/18/23 0628 07/18/23 1206 07/18/23 2058 07/19/23 0607  GLUCAP 161* 114* 137* 233* 119*    Iron Studies: No results for input(s): "IRON", "TIBC", "TRANSFERRIN", "FERRITIN" in the last 72 hours. Studies/Results: No results found.  Medications: Infusions:   Scheduled Medications:  apixaban  5 mg Oral BID   [START ON 07/20/2023] cephALEXin  250 mg Oral BID   darbepoetin (ARANESP) injection - NON-DIALYSIS  40 mcg Subcutaneous Q Fri-1800   diclofenac Sodium  2 g Topical QID   iron polysaccharides  150 mg Oral Daily   midodrine  10 mg Oral TID WC    multivitamin  1 tablet Oral QHS   Muscle Rub   Topical TID   tamsulosin  0.4 mg Oral QPC supper   traMADol  50 mg Oral BID    have reviewed scheduled and prn medications.  Physical Exam: General:NAD, comfortable Heart:RRR, s1s2 nl Lungs:clear b/l, no crackle Abdomen:soft, Non-tender, non-distended Extremities:has edema but looks good with compression hose on  Dialysis Access: None.  [facility] 07/19/2023,11:16 AM  LOS: 9 days

## 2023-07-19 NOTE — Progress Notes (Signed)
 Occupational Therapy Weekly Progress Note  Patient Details  Name: [facility]ultz MRN: [id number] Date of Birth: February 03, 1951  Beginning of progress report period: July 11, 2023 End of progress report period: July 20, 2023  Today's Date: 07/20/2023 OT Individual Time: 1017-1100 OT Individual Time Calculation (min): 43 min    Patient has met 2 of 5 short term goals.  Pt is limited by bilateral knee pain and lack of ROM requiring increased assistance to complete all LB ADL tasks.   Patient continues to demonstrate the following deficits: muscle weakness and joint pain, decreased cardiorespiratoy endurance, and decreased sitting balance, decreased standing balance, decreased postural control, and decreased balance strategies and therefore will continue to benefit from skilled OT intervention to enhance overall performance with BADL and Reduce care partner burden.  Patient progressing toward long term goals..  Continue plan of care.  OT Short Term Goals Week 1:  OT Short Term Goal 1 (Week 1): The pt will transfer from supine in bed to chair LOF with ModA using a sliding board x2 at 95% safe. OT Short Term Goal 1 - Progress (Week 1): Met OT Short Term Goal 2 (Week 1): The pt will complete UB dressing with s/u A at 95% safe OT Short Term Goal 2 - Progress (Week 1): Progressing toward goal OT Short Term Goal 3 (Week 1): The pt will complete LB bathing with  MinA incorporating AE at 95% safe OT Short Term Goal 3 - Progress (Week 1): Not met OT Short Term Goal 4 (Week 1): The pt will complete LB dressing with MinA incorporating AE at 95% safe OT Short Term Goal 4 - Progress (Week 1): Not met OT Short Term Goal 5 (Week 1): The pt will tolerate > 45 minutes of OT activity at 95% safe. OT Short Term Goal 5 - Progress (Week 1): Met Week 2:  OT Short Term Goal 1 (Week 2): Pt will complete LB dressing while seated on EOB with Mod A while utilizing AE as needed. OT Short Term Goal 2 (Week 2): Pt will  consistently demonstrate lateral scoot transfer at Min A with use of SB when transferring from surface to surface.  Skilled Therapeutic Interventions/Progress Updates:    Patient agreeable to participate in OT session. Reports on-going bilateral knee pain although no pain number provided. Monitored during session.   Patient participated in skilled OT session focusing on functional transfers and ADL re-training including LB dressing and toileting hygiene. Wife in room and did assist OT during session as needed.  Functional transfers: Pt completed functional transfers twice during session utilizing SB. Total assist needed for board placement each transfer. When performing lateral scoot transfer from TIS chair to bed (towards left), pt was able to complete with Min A and increased time with rest breaks taken as needed. When transferring back to TIS chair from bed (towards right side), pt required Mod A to complete lateral scoot as Roho cushion caused interference with right leg.  Bed mobility: Pt required Mod A to transition from sitting to supine and also left sidelying to sitting on EOB with HOB flat and use of bed rails. Mod A provided for rolling right and left using bed rails.  LB dressing: Pt required total A at bed level for LB dressing while managing shorts/pants and shoes.  Toileting hygiene: Pt required total A to complete toileting hygiene at bed level d/t incontinence. Pt reports that he does feel the urge to go but it usually is urgent and he doesn't  have time to transfer to [facility].   Therapy Documentation Precautions:  Precautions Precautions: Fall Precaution/Restrictions Comments: L nephrostomy tube, foley catheter Restrictions Weight Bearing Restrictions Per Provider Order: No  Therapy/Group: Individual Therapy  Limmie Patricia, OTR/L,CBIS  Supplemental OT - [facility] and [facility] Secure Chat Preferred   07/20/2023, 4:12 PM

## 2023-07-20 DIAGNOSIS — R5381 Other malaise: Secondary | ICD-10-CM | POA: Diagnosis not present

## 2023-07-20 DIAGNOSIS — E1122 Type 2 diabetes mellitus with diabetic chronic kidney disease: Principal | ICD-10-CM

## 2023-07-20 DIAGNOSIS — N200 Calculus of kidney: Secondary | ICD-10-CM | POA: Diagnosis not present

## 2023-07-20 DIAGNOSIS — N184 Chronic kidney disease, stage 4 (severe): Secondary | ICD-10-CM | POA: Diagnosis not present

## 2023-07-20 DIAGNOSIS — D72829 Elevated white blood cell count, unspecified: Secondary | ICD-10-CM

## 2023-07-20 LAB — CBC
HCT: 27.6 % — ABNORMAL LOW (ref 39.0–52.0)
Hemoglobin: 8.4 g/dL — ABNORMAL LOW (ref 13.0–17.0)
MCH: 27.1 pg (ref 26.0–34.0)
MCHC: 30.4 g/dL (ref 30.0–36.0)
MCV: 89 fL (ref 80.0–100.0)
Platelets: 382 K/uL (ref 150–400)
RBC: 3.1 MIL/uL — ABNORMAL LOW (ref 4.22–5.81)
RDW: 16 % — ABNORMAL HIGH (ref 11.5–15.5)
WBC: 12.4 K/uL — ABNORMAL HIGH (ref 4.0–10.5)
nRBC: 0 % (ref 0.0–0.2)

## 2023-07-20 LAB — COMPREHENSIVE METABOLIC PANEL
ALT: 16 U/L (ref 0–44)
AST: 16 U/L (ref 15–41)
Albumin: 1.9 g/dL — ABNORMAL LOW (ref 3.5–5.0)
Alkaline Phosphatase: 74 U/L (ref 38–126)
Anion gap: 7 (ref 5–15)
BUN: 34 mg/dL — ABNORMAL HIGH (ref 8–23)
CO2: 18 mmol/L — ABNORMAL LOW (ref 22–32)
Calcium: 8.1 mg/dL — ABNORMAL LOW (ref 8.9–10.3)
Chloride: 112 mmol/L — ABNORMAL HIGH (ref 98–111)
Creatinine, Ser: 3.31 mg/dL — ABNORMAL HIGH (ref 0.61–1.24)
GFR, Estimated: 19 mL/min — ABNORMAL LOW (ref >60)
Glucose, Bld: 121 mg/dL — ABNORMAL HIGH (ref 70–99)
Potassium: 4.1 mmol/L (ref 3.5–5.1)
Sodium: 137 mmol/L (ref 135–145)
Total Bilirubin: 0.3 mg/dL (ref 0.0–1.2)
Total Protein: 5.1 g/dL — ABNORMAL LOW (ref 6.5–8.1)

## 2023-07-20 LAB — GLUCOSE, CAPILLARY
Glucose-Capillary: 108 mg/dL — ABNORMAL HIGH (ref 70–99)
Glucose-Capillary: 117 mg/dL — ABNORMAL HIGH (ref 70–99)
Glucose-Capillary: 168 mg/dL — ABNORMAL HIGH (ref 70–99)
Glucose-Capillary: 144 mg/dL — ABNORMAL HIGH (ref 70–99)

## 2023-07-20 NOTE — Progress Notes (Signed)
 Occupational Therapy Session Note  Patient Details  Name: Amalio Loe MRN: [id number] Date of Birth: 03/17/1951  Session 1: {CHL IP REHAB OT TIME CALCULATIONS:304400400} Session 2: {CHL IP REHAB OT TIME CALCULATIONS:304400400}  Short Term Goals: Week 2:  OT Short Term Goal 1 (Week 2): Pt will complete LB dressing while seated on EOB with Mod A while utilizing AE as needed. OT Short Term Goal 2 (Week 2): Pt will consistently demonstrate lateral scoot transfer at Min A with use of SB when transferring from surface to surface.  Skilled Therapeutic Interventions/Progress Updates:    Session 1: Patient agreeable to participate in OT session. Reports *** pain level.   Patient participated in skilled OT session focusing on ***. Therapist facilitated/assessed/developed/educated/integrated/elicited *** in order to improve/facilitate/promote   Session 2: Patient agreeable to participate in OT session. Reports *** pain level.   Patient participated in skilled OT session focusing on ***. Therapist facilitated/assessed/developed/educated/integrated/elicited *** in order to improve/facilitate/promote    Therapy Documentation Precautions:  Precautions Precautions: Fall Precaution/Restrictions Comments: L nephrostomy tube, foley catheter Restrictions Weight Bearing Restrictions Per Provider Order: No      Therapy/Group: Individual Therapy  Limmie Patricia, OTR/L,CBIS  Supplemental OT - [facility] Secure Chat Preferred   07/20/2023, 10:28 PM

## 2023-07-20 NOTE — Progress Notes (Signed)
 PROGRESS NOTE   Subjective/Complaints: Working with therapy this morning.  Poor sleep last night due to diarrhea.  Also had bowel movements this morning.  Nephrostomy tube output continues to be red colored.    ROS: Patient denies abd pain , no breathing problems, no N/V + diarrhea  Objective:   No results found.  Recent Labs    07/20/23 0448  WBC 12.4*  HGB 8.4*  HCT 27.6*  PLT 382   Recent Labs    07/19/23 0615 07/20/23 0448  NA 137 137  K 3.8 4.1  CL 110 112*  CO2 21* 18*  GLUCOSE 113* 121*  BUN 37* 34*  CREATININE 3.36* 3.31*  CALCIUM 8.2* 8.1*    Intake/Output Summary (Last 24 hours) at 07/20/2023 1330 Last data filed at 07/20/2023 1312 Gross per 24 hour  Intake 0 ml  Output 1050 ml  Net -1050 ml     Pressure Injury 07/10/23 Buttocks Right;Mid;Upper Stage 2 -  Partial thickness loss of dermis presenting as a shallow open injury with a red, pink wound bed without slough. Pink opened stage 2 right upper buttock (Active)  07/10/23 1608  Location: Buttocks  Location Orientation: Right;Mid;Upper  Staging: Stage 2 -  Partial thickness loss of dermis presenting as a shallow open injury with a red, pink wound bed without slough.  Wound Description (Comments): Pink opened stage 2 right upper buttock  Present on Admission: Yes     Pressure Injury 07/10/23 Buttocks Right;Mid;Upper Stage 2 -  Partial thickness loss of dermis presenting as a shallow open injury with a red, pink wound bed without slough. Pink stage 2 directly beside the right upper stage 2 (Active)  07/10/23 1609  Location: Buttocks  Location Orientation: Right;Mid;Upper  Staging: Stage 2 -  Partial thickness loss of dermis presenting as a shallow open injury with a red, pink wound bed without slough.  Wound Description (Comments): Pink stage 2 directly beside the right upper stage 2  Present on Admission: Yes     Pressure Injury 07/10/23  Buttocks Right;Mid;Lower Stage 2 -  Partial thickness loss of dermis presenting as a shallow open injury with a red, pink wound bed without slough. Pink stage 2 to right buttock below the upper stage 2 (Active)  07/10/23 1610  Location: Buttocks  Location Orientation: Right;Mid;Lower  Staging: Stage 2 -  Partial thickness loss of dermis presenting as a shallow open injury with a red, pink wound bed without slough.  Wound Description (Comments): Pink stage 2 to right buttock below the upper stage 2  Present on Admission: Yes     Pressure Injury 07/10/23 Buttocks Left;Mid;Lower Stage 2 -  Partial thickness loss of dermis presenting as a shallow open injury with a red, pink wound bed without slough. Pink stage 2 to left buttock (Active)  07/10/23 1611  Location: Buttocks  Location Orientation: Left;Mid;Lower  Staging: Stage 2 -  Partial thickness loss of dermis presenting as a shallow open injury with a red, pink wound bed without slough.  Wound Description (Comments): Pink stage 2 to left buttock  Present on Admission: Yes    Physical Exam: Vital Signs Blood pressure 139/86, pulse 79, temperature 98.3 F (  36.8 C), temperature source Oral, resp. rate 18, height 5\' 9"  (1.753 m), weight 102.2 kg, SpO2 100%.  General: No acute distress Mood and affect are appropriate Heart: Regular rate and rhythm no rubs murmurs or extra sounds Lungs: Clear to auscultation, breathing unlabored, no rales or wheezes Abdomen: Positive bowel sounds, soft nontender to palpation, nondistended Nephrostomy with red-colored output Extremities: No clubbing, cyanosis, or edema Skin: No evidence of breakdown, no evidence of rash Neurologic: Cranial nerves II through XII intact, motor strength is 4/5 in rght 3- left  deltoid, bicep, tricep, grip,3- RIght 4- left  hip flexor, knee extensors, 4/4 B ankle dorsiflexor and plantar flexor   -Buttocks MASD--not visualized this am     MSK:    Both knees with effusions,  tight. Left shoulder tight in ER/IR/F/E  Pain more with IR/ER than any movement. Was able to abduct to 90+ degrees       Sitting edge of bed with therapy       Assessment/Plan: 1. Functional deficits which require 3+ hours per day of interdisciplinary therapy in a comprehensive inpatient rehab setting. Physiatrist is providing close team supervision and 24 hour management of active medical problems listed below. Physiatrist and rehab team continue to assess barriers to discharge/monitor patient progress toward functional and medical goals  Care Tool:  Bathing  Bathing activity did not occur:  (patient completed a simulated task at bed LOF) Body parts bathed by patient: Right arm, Left arm, Chest, Abdomen, Front perineal area, Right upper leg, Left upper leg, Face   Body parts bathed by helper: Buttocks, Left lower leg, Right lower leg     Bathing assist Assist Level: Maximal Assistance - Patient 24 - 49%     Upper Body Dressing/Undressing Upper body dressing   What is the patient wearing?: Pull over shirt    Upper body assist Assist Level: Minimal Assistance - Patient > 75%    Lower Body Dressing/Undressing Lower body dressing      What is the patient wearing?: Pants, Underwear/pull up, Incontinence brief     Lower body assist Assist for lower body dressing: Maximal Assistance - Patient 25 - 49%     Toileting Toileting    Toileting assist Assist for toileting: 2 Helpers     Transfers Chair/bed transfer  Transfers assist  Chair/bed transfer activity did not occur: Safety/medical concerns (not safe to get up)  Chair/bed transfer assist level: 2 Helpers (slide board.)     Locomotion Ambulation   Ambulation assist   Ambulation activity did not occur: Safety/medical concerns          Walk 10 feet activity   Assist  Walk 10 feet activity did not occur: Safety/medical concerns        Walk 50 feet activity   Assist Walk 50 feet with 2 turns  activity did not occur: Safety/medical concerns         Walk 150 feet activity   Assist Walk 150 feet activity did not occur: Safety/medical concerns         Walk 10 feet on uneven surface  activity   Assist Walk 10 feet on uneven surfaces activity did not occur: Safety/medical concerns         Wheelchair     Assist Is the patient using a wheelchair?: Yes Type of Wheelchair: Manual    Wheelchair assist level: Dependent - Patient 0% Max wheelchair distance: 150'    Wheelchair 50 feet with 2 turns activity    Assist  Assist Level: Dependent - Patient 0%   Wheelchair 150 feet activity     Assist      Assist Level: Dependent - Patient 0%   Blood pressure 139/86, pulse 79, temperature 98.3 F (36.8 C), temperature source Oral, resp. rate 18, height 5\' 9"  (1.753 m), weight 102.2 kg, SpO2 100%.   Medical Problem List and Plan: 1. Functional deficits secondary to debility related to septic shock/C. difficile colitis/AKI superimposed on CKD stage IV             -patient may shower with tubes/drains covered             -ELOS/Goals:  3/28, min assist PT/OT             -Continue CIR therapies including PT, OT     2.  Antithrombotics: -DVT/anticoagulation:  Pharmaceutical: Eliquis             -antiplatelet therapy: N/A   3. Pain Management: tramadol prn, Zanaflex 4 mg twice daily as needed   Biliateral OA of knees  -ordered bilateral neoprine knee sleeves --try today  -voltaren gel tid to knees  -have scheduled tramadol 50mg  at 0700 and 1200 daily. Continue prn as well  Mild adhesive capsulitis left shoulder---added sports cream. Can use voltaren gel also. Aggressive ROM with therapy and while in bed 4. Mood/Behavior/Sleep: Trazodone 50 mg nightly as needed.  Provide emotional support             -antipsychotic agents: N/A   5. Neuropsych/cognition: This patient is capable of making decisions on his own behalf.   6. Skin/Wound Care:  Routine skin checks   7. Fluids/Electrolytes/Nutrition:    -3/14 po intake remains poor, albumin low, weights falling  -will ask RD for assistance. Have spoken to patient about intake  -?IV albumin (see below) 8.  Oliguric AKI superimposed on CKD stage IV.  Baseline creatinine 4.9.  CRRT discontinued 2/25.  No current plan for long-term dialysis.  Follow-up renal services              - Per discussion with nephrology Dr. Verna Czech, keep Foley catheter in to allow accurate I's and O's through Monday; then, can remove and do DC Foley trial while continuing to document strict outputs             - Continue daily assessments of renal function per nephrology   3/8- per renal, Cr leveling out- and con't Foley- will remove Monday  -nephrology following, Cr remains in 4's.   3/14 renal function with some improvement today     -suspect lower extremity edema is related to renal function/low albumin   Appreciate nephro input   -3/17 BUN/creatinine slightly improved 34/3.31, appreciate nephrology assistance 9.  C. difficile colitis.  Completed course of oral vancomycin 3/7.  DC'd IV Flagyl 3/3. Enteric precautions   3/14 stools formed this morning   3/17 discussed with ID pharmacy, patient having more frequent bowel movements.  Monitor today and if continues consider Dificid treatment 10.  Status post left nephrostomy tube d/t staghorn calculi. Has Foley Nephrostomy tube placed 1/29 per IR with plan for ureteric stent placement 3/21              - Careful monitoring of nephrostomy output  -3/14 flomax has been added to improve emptying   -spoke to Dr. Mena Goes today who will perform endoscopy on 3/21 to initially break up calculi/ remove nephrostomy tube. No stent apparently    -he requested prophylactic keflex 250mg   bid beginning 3/17    Still on enteric prec  -Contact IR today regarding blood in nephrostomy-understandable given calculi that can cause irritation, IR PA to evaluate to make sure it is  functioning correctly, appreciate assistance 11.  Chronic atrial fibrillation with episodes of bradycardia.  Followed by cardiology services.  Continue Eliquis.  Avoid AV nodal blocking agents   12.  Diabetes mellitus.  Hemoglobin A1c 6.2.    -tightly controlled. Dc SSI and change cbg checks to qam only   CBG (last 3)  Recent Labs    07/19/23 1128 07/20/23 0615 07/20/23 1159  GLUCAP 147* 108* 117*    Controlled diet 3/16  3/17 CBGs controlled    13.  Hypotension.  ProAmatine 10 mg 3 times daily.  Monitor with increased mobility   - Normotensive on admission  3/9- BP running 100-s to 120s systolic- con't regimen 14.  Chronic anemia.  Niferex daily/Aranesp.  Monitor for any bleeding episodes   3/8- Hb 7.1 today- will order transfusion- transfusion of 1 unit pRBCs.   3/14 hgb up to 9.1  -3/17 hemoglobin down to 8.4, continue to monitor    16. Leukocytosis  3/9- Pt's WBC is 21.7- up from 16k- is afebrile- due to his recent C diff, that just finished correct dosing of ABX for, called ID for guidance on treatment- don't want to cause more Cdiff- ID consulted ---no new orders  3/10 WBC's down to 17k  --have been hovering above and below 20 for awhile. No new clinical symptoms. Nephrostomy drainage clear, yellow  3/14 wbc's down to 13k.--recheck Monday   3/17 WBC is down to 12.4      LOS: 10 days A FACE TO FACE EVALUATION WAS PERFORMED  Fanny Dance 07/20/2023, 1:30 PM

## 2023-07-20 NOTE — Progress Notes (Signed)
 [facility] NEPHROLOGY PROGRESS NOTE  Assessment/ Plan: Pt is a 73 y.o. yo male    # AKI on CKD 4 - AKI is ischemic and pre-renal insults related to hypotension +/- ARB and intravasc vol depletion.  CT w/o obstruction, has L neph tube.  He was on CRRT from 2/22 - 2/25.  Note that Cr 4.06- 5.3 from jan 2025, eGFR 11-15 ml/min.  For reference in 01/2022 with Cr 2.48 from Care Everywhere Cont holding HD and watching labs and UOP; creatinine is leveling trending down, increasing urine output. No vascular access currenlty Removed foley catheter (CIC 3-4x/d), has lt PCN No Need for HD today. Have made great strides the last 5 days in volume and clearance. Stable renal function and slowly improving.  BP/volume-  still overall overloaded but have made progress-  no diuretic-  will watch and add on diuretic if needed  net neg 10.5L during this hospitalization.  #L nephrostomy tube - placed in Jan 2025 by IR, and is f/b urology. Has staghorn calculi on the L and had previously been scheduled to have stone removed and stent placed per his wife on March 7th at [facility]. Postponed #Urinary retention - as above, per CIR #Chronic atrial fib - on eliquis; per primary team  #Hypophosphatemia secondary to CRRT - resolved #Hypokalemia -  resolved #Anemia normocytic: cont ESA; PRBC's per primary team  Subjective: No new event.  Urine output 1.6 L . Lt PCN and CIC 3-4x/day Denies nausea, vomiting, chest pain, shortness of breath.   Crt in the 3's and slowly improving.  Objective Vital signs in last 24 hours: Vitals:   07/19/23 1500 07/19/23 1939 07/20/23 0242 07/20/23 0305  BP: (!) (P) 149/87 (!) 161/82 139/86   Pulse: (P) 70 71 79   Resp: (P) 18 18 18    Temp: (P) 97.9 F (36.6 C) 98.3 F (36.8 C) 98.3 F (36.8 C)   TempSrc: (P) Oral Oral Oral   SpO2: (P) 99% 100% 100%   Weight:    102.2 kg  Height:       Weight change: -0.6 kg  Intake/Output Summary (Last 24 hours) at 07/20/2023  1031 Last data filed at 07/20/2023 0811 Gross per 24 hour  Intake 240 ml  Output 1675 ml  Net -1435 ml       Labs: RENAL PANEL Recent Labs  Lab 07/16/23 0606 07/17/23 0856 07/18/23 0534 07/19/23 0615 07/20/23 0448  NA 134* 136 136 137 137  K 4.2 3.7 3.8 3.8 4.1  CL 107 109 110 110 112*  CO2 18* 19* 18* 21* 18*  GLUCOSE 80 88 125* 113* 121*  BUN 49* 42* 40* 37* 34*  CREATININE 4.27* 3.88* 3.61* 3.36* 3.31*  CALCIUM 8.2* 8.6* 7.9* 8.2* 8.1*  PHOS  --   --   --  4.9*  --   ALBUMIN  --   --   --  1.9* 1.9*    Liver Function Tests: Recent Labs  Lab 07/19/23 0615 07/20/23 0448  AST  --  16  ALT  --  16  ALKPHOS  --  74  BILITOT  --  0.3  PROT  --  5.1*  ALBUMIN 1.9* 1.9*    No results for input(s): "LIPASE", "AMYLASE" in the last 168 hours. No results for input(s): "AMMONIA" in the last 168 hours. CBC: Recent Labs    06/03/23 0347 06/04/23 0503 06/04/23 1126 06/05/23 4259 07/03/23 1514 07/04/23 0140 07/12/23 5638 07/13/23 7564 07/15/23 3329 07/17/23 5188 07/20/23  0448  HGB 10.2*   < >  --    < >  --    < > 8.8* 8.2* 8.1* 9.1* 8.4*  MCV 85.5   < >  --    < >  --    < > 89.3 89.3 88.5 90.4 89.0  FERRITIN  --   --  32  --  88  --   --   --   --   --   --   TIBC 384  --   --   --  179*  --   --   --   --   --   --   IRON 45  --   --   --  35*  --   --   --   --   --   --    < > = values in this interval not displayed.    Cardiac Enzymes: No results for input(s): "CKTOTAL", "CKMB", "CKMBINDEX", "TROPONINI" in the last 168 hours. CBG: Recent Labs  Lab 07/18/23 1206 07/18/23 2058 07/19/23 0607 07/19/23 1128 07/20/23 0615  GLUCAP 137* 233* 119* 147* 108*    Iron Studies: No results for input(s): "IRON", "TIBC", "TRANSFERRIN", "FERRITIN" in the last 72 hours. Studies/Results: No results found.  Medications: Infusions:   Scheduled Medications:  apixaban  5 mg Oral BID   cephALEXin  250 mg Oral BID   darbepoetin (ARANESP) injection -  NON-DIALYSIS  40 mcg Subcutaneous Q Fri-1800   diclofenac Sodium  2 g Topical QID   iron polysaccharides  150 mg Oral Daily   midodrine  10 mg Oral TID WC   multivitamin  1 tablet Oral QHS   Muscle Rub   Topical TID   tamsulosin  0.4 mg Oral QPC supper   traMADol  50 mg Oral BID    have reviewed scheduled and prn medications.  Physical Exam: General:NAD, comfortable Heart:RRR, s1s2 nl Lungs:clear b/l, no crackle Abdomen:soft, Non-tender, non-distended Extremities:has edema but looks good with compression hose on  Back: lt PCN Dialysis Access: None. GU: no foley  Nastassja Witkop W 07/20/2023,10:31 AM  LOS: 10 days

## 2023-07-20 NOTE — Progress Notes (Signed)
 Occupational Therapy Session Note  Patient Details  Name: Calvin Schultz MRN: [id number] Date of Birth: 1951/01/26  Today's Date: 07/20/2023 OT Individual Time: 1141-1210 OT Individual Time Calculation (min): 29 min    Short Term Goals: Week 1:  OT Short Term Goal 1 (Week 1): The pt will transfer from supine in bed to chair LOF with ModA using a sliding board x2 at 95% safe. OT Short Term Goal 1 - Progress (Week 1): Met OT Short Term Goal 2 (Week 1): The pt will complete UB dressing with s/u A at 95% safe OT Short Term Goal 2 - Progress (Week 1): Progressing toward goal OT Short Term Goal 3 (Week 1): The pt will complete LB bathing with  MinA incorporating AE at 95% safe OT Short Term Goal 3 - Progress (Week 1): Not met OT Short Term Goal 4 (Week 1): The pt will complete LB dressing with MinA incorporating AE at 95% safe OT Short Term Goal 4 - Progress (Week 1): Not met OT Short Term Goal 5 (Week 1): The pt will tolerate > 45 minutes of OT activity at 95% safe. OT Short Term Goal 5 - Progress (Week 1): Met  Skilled Therapeutic Interventions/Progress Updates:  Pt greeted in bed with NT present providing pericare, pt agreeable to OT intervention.      Transfers/bed mobility/functional mobility: pt completed 2 slide board transfers to R and L side with pt needing increased assist to scoot to R side with pt needing MAX A +2 and total A to place SB, pt did better scooting back to L side with pt needing MODA +2. MAX cues needed for technique, anterior lean and head/hips relationship. Session greatly limited by BMs.    ADLs:  LB dressing: total A +2 to don pants from bed level   Toileting: total A +2 for 3/3 toileting tasks from bed level   Ended session with pt on bed pan, NT aware and wife present.                Therapy Documentation Precautions:  Precautions Precautions: Fall Precaution/Restrictions Comments: L nephrostomy tube, foley catheter Restrictions Weight Bearing  Restrictions Per Provider Order: No G Pain: Unrated pain reported in bilateral knees, rest breaks provided as needed.    Therapy/Group: Individual Therapy  [name] 07/20/2023, 12:33 PM

## 2023-07-20 NOTE — Progress Notes (Signed)
 Physical Therapy Weekly Progress Note  Patient Details  Name: Calvin Schultz MRN: [id number] Date of Birth: December 31, 1950  Beginning of progress report period: July 11, 2023 End of progress report period: July 10, 2023  Today's Date: 07/20/2023 PT Individual Time: 0800-0915, 4098-1191 PT Individual Time Calculation (min): 75 min, 77 min   Patient has met 3 of 4 short term goals.  Pt has made great progress towards his goals this week. Pt level of assist overall fluctuates with fatigue but pt is performing bed mobility with use of hospital bed features and min -mod A, slide board transfer with min A-mod A, sit to stand with use of steady and +1 max A, pt began gait training with use of sara plus. Discharge curently scheduled for 3/28. Plan to schedule [facility] consult with [facility]. Therapy believes pt would benefit from increased length of stay to further progress pt independence, will discuss further during team conference on 3/19.   Patient continues to demonstrate the following deficits muscle weakness and muscle joint tightness, decreased cardiorespiratoy endurance, and decreased sitting balance, decreased standing balance, and decreased balance strategies and therefore will continue to benefit from skilled PT intervention to increase functional independence with mobility.  Patient progressing toward long term goals..  Continue plan of care.  PT Short Term Goals Week 1:  PT Short Term Goal 1 (Week 1): Pt will complete bed mobility with modA consistently. PT Short Term Goal 1 - Progress (Week 1): Met PT Short Term Goal 2 (Week 1): Pt will complete sit to stand with maxA. PT Short Term Goal 2 - Progress (Week 1): Met (with use of steady) PT Short Term Goal 3 (Week 1): Pt will complete bed to chair with maxA consistently. PT Short Term Goal 3 - Progress (Week 1): Met PT Short Term Goal 4 (Week 1): Pt will initiate gait training. PT Short Term Goal 4 - Progress (Week 1): Progressing toward  goal Week 2:  PT Short Term Goal 1 (Week 2): pt will initiate gait training PT Short Term Goal 2 (Week 2): pt will complete sit to stand with LRAD and mod A or less PT Short Term Goal 3 (Week 2): Pt will complete bed to chair transfer with mod A or less consistently PT Short Term Goal 4 (Week 2): pt will perform custom WC eval  Skilled Therapeutic Interventions/Progress Updates:      Pt supine in bed upon arrival. Pt agreeable to therapy. Pt reports increased fatigue 2/2 lack of sleep last night  due to multiple BM. Pt reports 9/10 pain in B knee, premedicated. Therapist provided rest breaks and repositioning.   Donned ted hose and B knee braces while supine in bed with total A.   Pt performed supine to sit with use of bed rails and HOB elevated with min A for trunk, pt able to manage legs oneself, verbal cues provided for technique with emphasis on L sidelying and UE positioning.  Pt performed reciprocal scooting EOB with supervision an increased time to get feet on the floor.  Pt attempted sit to stand x3 from elevated bed with use of steady and +1 max A, but unable to obtain fully erect positioning 2/2 pain in B knee however pt able to get enough clearance for placement of seat platform on steady.   Pt stood from seat platform of steady x1 with +1 mod A, with heavy trunk lean, pt incontinent of bowel with standing. Returned to sitting EOB with min A with use of  steady.   Lateral scooting to L with mod A, with mod verbal/tactile cues provided for head hip ratio. Pt demos   Sit to supine with use of bed features and light mod A for B LE with max verbal cues provided for technique.   Pt performed rolling to R and L with use of bed rails and min A, for placement of L UE on bed rail for rolling to R, verbal cues provided for technique/sequencing.   Performed pericare and donning/doffing pants with total A.   Pt performed bridges x10 with therapist placing B LE into knee flexion (as much as  pt can tolerate) and providing approximation. Pt demos limited clearance however improved glute activation in comparison to last week.   Pt performed supine to sit with min A with use of bed features and verbal cues provided for technique.   Pt performed lateral trunk lean to L with min A for placement of slide board. Pt performed slide board transfer bed to level WC with +1 min A with +2 on stand by for safety.   Pt seated in TIS WC at end of session with all needs within reach.   Treatment Session 2   Pt supine asleep in bed upon arrival. Pt agreeable to therapy. Pt reports pain in B knee, not quantified. Pt provided rest breaks and repositioning throughout session.   Pt initiated gait training with use of sara plus (with foot plate removed however knee pad still in place), pt able to perform 2x10 heel raises, and 1x10 alternating toe raises, however unable to get adequate foot clearance to take a step. Pt performed lateral weight shifting x 10 B with verbal and tactile cues provided for technique.  Removed knee pad on sara plus, and attempted sit to stand x4, however discontinued each trail 2/2 inadequate hip/knee extension, and significantly increased shoulder elevation. Plan to attempt next session with different sara plus sling.   Pt requesting to end session in WC. Pt performed slide board transfer bed to TIS with +2 A for safety 2/2 positioning of bed and pt fatigue.   Pt seated in WC at end of session with all needs within reach and seatbelt alarm on.     Therapy Documentation Precautions:  Precautions Precautions: Fall Precaution/Restrictions Comments: L nephrostomy tube, foley catheter Restrictions Weight Bearing Restrictions Per Provider Order: No  Therapy/Group: Individual Therapy  [facility] Ambrose Finland, [place], DPT  07/20/2023, 7:35 AM

## 2023-07-21 LAB — CBC
HCT: 28.1 % — ABNORMAL LOW (ref 39.0–52.0)
Hemoglobin: 8.3 g/dL — ABNORMAL LOW (ref 13.0–17.0)
MCH: 26.5 pg (ref 26.0–34.0)
MCHC: 29.5 g/dL — ABNORMAL LOW (ref 30.0–36.0)
MCV: 89.8 fL (ref 80.0–100.0)
Platelets: 375 10*3/uL (ref 150–400)
RBC: 3.13 MIL/uL — ABNORMAL LOW (ref 4.22–5.81)
RDW: 15.9 % — ABNORMAL HIGH (ref 11.5–15.5)
WBC: 13.6 10*3/uL — ABNORMAL HIGH (ref 4.0–10.5)
nRBC: 0 % (ref 0.0–0.2)

## 2023-07-21 LAB — GLUCOSE, CAPILLARY
Glucose-Capillary: 89 mg/dL (ref 70–99)
Glucose-Capillary: 129 mg/dL — ABNORMAL HIGH (ref 70–99)
Glucose-Capillary: 115 mg/dL — ABNORMAL HIGH (ref 70–99)

## 2023-07-21 NOTE — Progress Notes (Signed)
 Nutrition Follow-up  DOCUMENTATION CODES:   Obesity unspecified  INTERVENTION:   Continue carb modified diet with 2 L fluid restriction. Wife to continue to bring in meals for patient.   Continue Magic cup TID with meals, each supplement provides 290 kcal and 9 grams of protein.  Continue renal MVI daily.  NUTRITION DIAGNOSIS:   Increased nutrient needs related to acute illness (AKI superimposed on CKD IV) as evidenced by estimated needs.  Ongoing   GOAL:   Patient will meet greater than or equal to 90% of their needs  Unmet   MONITOR:   PO intake, Weight trends, Supplement acceptance  REASON FOR ASSESSMENT:   Consult Poor PO  ASSESSMENT:   73 yo male admitted with functional deficits and debility r/t septic shock, C difficile colitis, AKI superimposed on CKD stage IV. PMH includes DM, HTN, arthritis, HLD, CKD, nephrostomy tube placement 06/03/23.  Patient receiving nursing care, so RD unable to speak with him. Patient has been on a carb modified diet with 2 L fluid restriction since admission. Meal intakes documented at 0% since 3/17. Per chat with nursing, patient's wife is bringing in breakfast, lunch, and dinner every day and patient is eating ~75% of each meal.  Patient had 5 loose BMs on 3/16 and 4 loose BMs on 3/17.  UOP 1085 ml x 24 hours. Nephrostomy tube output 485 ml x 24 hours.   Labs reviewed. Phos 4.9 (3/16) CBG: [phone]  Medications reviewed and include aranesp, niferex, rena-vit, flomax.  Admit weight: 107.8 kg Current weight: 99.8 kg  I/O -11.8 L since admission Patient with moderate pitting edema to BLE this morning per RN assessment.  Diet Order:   Diet Order             Diet Carb Modified Fluid consistency: Thin; Room service appropriate? No; Fluid restriction: 2000 mL Fluid  Diet effective now                   EDUCATION NEEDS:   Not appropriate for education at this time  Skin:  Skin Assessment: Skin Integrity  Issues: Skin Integrity Issues:: Stage II, Incisions Stage II: left & right buttocks Incisions: coccyx  Last BM:  3/18 type 6  Height:   Ht Readings from Last 1 Encounters:  07/10/23 5\' 9"  (1.753 m)    Weight:   Wt Readings from Last 1 Encounters:  07/21/23 99.8 kg    Ideal Body Weight:  72.7 kg  BMI:  Body mass index is 32.49 kg/m.  Estimated Nutritional Needs:   Kcal:  2200-2400  Protein:  110-120 g  Fluid:  >/= 2.2 L   Gabriel Rainwater RD, LDN, CNSC Contact via secure chat. If unavailable, use group chat "RD Inpatient."

## 2023-07-21 NOTE — Progress Notes (Signed)
 Physical Therapy Session Note  Patient Details  Name: Calvin Schultz MRN: [id number] Date of Birth: October 16, 1950  Today's Date: 07/21/2023 PT Individual Time: 1001-1043 PT Individual Time Calculation (min): 42 min   Short Term Goals: Week 2:  PT Short Term Goal 1 (Week 2): pt will initiate gait training PT Short Term Goal 2 (Week 2): pt will complete sit to stand with LRAD and mod A or less PT Short Term Goal 3 (Week 2): Pt will complete bed to chair transfer with mod A or less consistently PT Short Term Goal 4 (Week 2): pt will perform custom WC eval  Skilled Therapeutic Interventions/Progress Updates:      Pt seated in TIS WC upon arrival. Pt agreeable to therapy. Pt reports 9/10 B knee pain, premedicated.   WC eval consult for [facility] scheduled for 3/25 at 9 am. Next session will provide pt with power WC.   Followed up regarding pt home measurement sheet. Pt wife reports she will complete it this week.   Followed up regarding pt options for transportation home: include [employer] or [employer]. Verified with [name] for nu motion, best option will be use of [employer]. Plan to provide education to family regarding this next session.   Sit to stand with use of steady and max A to get on seat of steady.    Tretametn session focused on standing tolerance with emphasis on knee extension and hip extension for improved upright posture and muscle activation. Pt unable to achieve full terminal knee extension this session, pt heavily dependent on trunk flexion compensation for knee extension despite verbal/tactile cues.   Sit to stand from seat of steady x10 (holding for as long as tolerated-~20 seconds today each trails) with therapist providing mod-max A.   Pt seated in TIS WC at end of session with all needs within reach and wife in room.       Therapy Documentation Precautions:  Precautions Precautions: Fall Precaution/Restrictions Comments: L  nephrostomy tube, foley catheter Restrictions Weight Bearing Restrictions Per Provider Order: No   Therapy/Group: Individual Therapy  [facility] Ambrose Finland, [place], DPT  07/21/2023, 10:19 AM

## 2023-07-21 NOTE — Progress Notes (Signed)
 [facility] NEPHROLOGY PROGRESS NOTE  Assessment/ Plan: Pt is a 74 y.o. yo male    # AKI on CKD 4 - AKI is ischemic and pre-renal insults related to hypotension +/- ARB and intravasc vol depletion.  CT Schultz/o obstruction, has L neph tube.  He was on CRRT from 2/22 - 2/25.  Note that Cr 4.06- 5.3 from jan 2025, eGFR 11-15 ml/min.  For reference in 01/2022 with Cr 2.48 from Care Everywhere Cont holding HD and watching labs and UOP; creatinine is leveling trending down, increasing urine output. No vascular access currenlty Removed foley catheter (CIC 3-4x/d), has lt PCN No Need for HD today. Have made great strides the last 6 days in volume and clearance. Renal function was slowly improving; no labs today. Will draw Bmet tomorrow.Marland Kitchen  BP/volume-  still overall overloaded but have made progress-  no diuretic-  will watch and add on diuretic if needed  net neg 10.5L during this hospitalization.  #L nephrostomy tube - placed in Jan 2025 by IR, and is f/b urology. Has staghorn calculi on the L and had previously been scheduled to have stone removed and stent placed per his wife on March 7th at [facility]. Postponed #Urinary retention - as above, per CIR #Chronic atrial fib - on eliquis; per primary team  #Hypophosphatemia secondary to CRRT - resolved #Hypokalemia -  resolved #Anemia normocytic: cont ESA; PRBC's per primary team  Subjective: No new event.  Urine output 1.6/1 L . Lt PCN and CIC 3-4x/day Denies nausea, vomiting, chest pain, shortness of breath.   Crt in the 3's and slowly improving.  Objective Vital signs in last 24 hours: Vitals:   07/20/23 1958 07/21/23 0505 07/21/23 0554 07/21/23 1336  BP: (!) 153/80 125/77  (!) 146/80  Pulse: 96 91  98  Resp: 18 16  18   Temp: 98.1 F (36.7 C) 98 F (36.7 C)  97.6 F (36.4 C)  TempSrc: Oral   Oral  SpO2:  99%  100%  Weight:   99.8 kg   Height:       Weight change: -2.4 kg  Intake/Output Summary (Last 24 hours) at 07/21/2023  1501 Last data filed at 07/21/2023 1300 Gross per 24 hour  Intake 0 ml  Output 1075 ml  Net -1075 ml       Labs: RENAL PANEL Recent Labs  Lab 07/16/23 0606 07/17/23 0856 07/18/23 0534 07/19/23 0615 07/20/23 0448  NA 134* 136 136 137 137  K 4.2 3.7 3.8 3.8 4.1  CL 107 109 110 110 112*  CO2 18* 19* 18* 21* 18*  GLUCOSE 80 88 125* 113* 121*  BUN 49* 42* 40* 37* 34*  CREATININE 4.27* 3.88* 3.61* 3.36* 3.31*  CALCIUM 8.2* 8.6* 7.9* 8.2* 8.1*  PHOS  --   --   --  4.9*  --   ALBUMIN  --   --   --  1.9* 1.9*    Liver Function Tests: Recent Labs  Lab 07/19/23 0615 07/20/23 0448  AST  --  16  ALT  --  16  ALKPHOS  --  74  BILITOT  --  0.3  PROT  --  5.1*  ALBUMIN 1.9* 1.9*    No results for input(s): "LIPASE", "AMYLASE" in the last 168 hours. No results for input(s): "AMMONIA" in the last 168 hours. CBC: Recent Labs    06/03/23 6578 06/04/23 0503 06/04/23 1126 06/05/23 4696 07/03/23 1514 07/04/23 0140 07/13/23 2952 07/15/23 8413 07/17/23 2440 07/20/23 0448 07/21/23  0501  HGB 10.2*   < >  --    < >  --    < > 8.2* 8.1* 9.1* 8.4* 8.3*  MCV 85.5   < >  --    < >  --    < > 89.3 88.5 90.4 89.0 89.8  FERRITIN  --   --  32  --  88  --   --   --   --   --   --   TIBC 384  --   --   --  179*  --   --   --   --   --   --   IRON 45  --   --   --  35*  --   --   --   --   --   --    < > = values in this interval not displayed.    Cardiac Enzymes: No results for input(s): "CKTOTAL", "CKMB", "CKMBINDEX", "TROPONINI" in the last 168 hours. CBG: Recent Labs  Lab 07/20/23 1159 07/20/23 1857 07/20/23 2152 07/21/23 0553 07/21/23 1142  GLUCAP 117* 168* 144* 89 129*    Iron Studies: No results for input(s): "IRON", "TIBC", "TRANSFERRIN", "FERRITIN" in the last 72 hours. Studies/Results: No results found.  Medications: Infusions:   Scheduled Medications:  apixaban  5 mg Oral BID   cephALEXin  250 mg Oral BID   darbepoetin (ARANESP) injection - NON-DIALYSIS   40 mcg Subcutaneous Q Fri-1800   diclofenac Sodium  2 g Topical QID   iron polysaccharides  150 mg Oral Daily   midodrine  10 mg Oral TID WC   multivitamin  1 tablet Oral QHS   Muscle Rub   Topical TID   tamsulosin  0.4 mg Oral QPC supper   traMADol  50 mg Oral BID    have reviewed scheduled and prn medications.  Physical Exam: General:NAD, comfortable Heart:RRR, s1s2 nl Lungs:clear b/l, no crackle Abdomen:soft, Non-tender, non-distended Extremities:has edema but looks good with compression hose on  Back: lt PCN Dialysis Access: None. GU: no foley  Calvin Schultz 07/21/2023,3:01 PM  LOS: 11 days

## 2023-07-21 NOTE — Progress Notes (Signed)
 Occupational Therapy Session Note  Patient Details  Name: Calvin Schultz MRN: [id number] Date of Birth: 1950-06-09  {CHL IP REHAB OT TIME CALCULATIONS:[id number]}   Short Term Goals: Week 2:  OT Short Term Goal 1 (Week 2): Pt will complete LB dressing while seated on EOB with Mod A while utilizing AE as needed. OT Short Term Goal 2 (Week 2): Pt will consistently demonstrate lateral scoot transfer at Min A with use of SB when transferring from surface to surface.  Skilled Therapeutic Interventions/Progress Updates:    Patient agreeable to participate in OT session. Reports *** pain level.   Patient participated in skilled OT session focusing on ***. Therapist facilitated/assessed/developed/educated/integrated/elicited *** in order to improve/facilitate/promote    Therapy Documentation Precautions:  Precautions Precautions: Fall Precaution/Restrictions Comments: L nephrostomy tube Restrictions Weight Bearing Restrictions Per Provider Order: No  Therapy/Group: Individual Therapy  Limmie Patricia, OTR/L,CBIS  Supplemental OT - [facility] and [facility] Secure Chat Preferred   07/21/2023, 10:09 PM

## 2023-07-21 NOTE — Progress Notes (Addendum)
 PROGRESS NOTE   Subjective/Complaints: Working with therapy again this morning.  Slept better last night, not having as many bowel movements overnight.  He did have a large bowel movement yesterday afternoon and this morning.  Nephrostomy tube output was bloody, more blood-tinged today.   ROS: Patient denies abd pain , no breathing problems, no N/V + diarrhea-improved  Objective:   No results found.  Recent Labs    07/20/23 0448 07/21/23 0501  WBC 12.4* 13.6*  HGB 8.4* 8.3*  HCT 27.6* 28.1*  PLT 382 375   Recent Labs    07/19/23 0615 07/20/23 0448  NA 137 137  K 3.8 4.1  CL 110 112*  CO2 21* 18*  GLUCOSE 113* 121*  BUN 37* 34*  CREATININE 3.36* 3.31*  CALCIUM 8.2* 8.1*    Intake/Output Summary (Last 24 hours) at 07/21/2023 1340 Last data filed at 07/21/2023 2952 Gross per 24 hour  Intake 0 ml  Output 1075 ml  Net -1075 ml     Pressure Injury 07/10/23 Buttocks Right;Mid;Upper Stage 2 -  Partial thickness loss of dermis presenting as a shallow open injury with a red, pink wound bed without slough. Pink opened stage 2 right upper buttock (Active)  07/10/23 1608  Location: Buttocks  Location Orientation: Right;Mid;Upper  Staging: Stage 2 -  Partial thickness loss of dermis presenting as a shallow open injury with a red, pink wound bed without slough.  Wound Description (Comments): Pink opened stage 2 right upper buttock  Present on Admission: Yes     Pressure Injury 07/10/23 Buttocks Right;Mid;Upper Stage 2 -  Partial thickness loss of dermis presenting as a shallow open injury with a red, pink wound bed without slough. Pink stage 2 directly beside the right upper stage 2 (Active)  07/10/23 1609  Location: Buttocks  Location Orientation: Right;Mid;Upper  Staging: Stage 2 -  Partial thickness loss of dermis presenting as a shallow open injury with a red, pink wound bed without slough.  Wound Description  (Comments): Pink stage 2 directly beside the right upper stage 2  Present on Admission: Yes     Pressure Injury 07/10/23 Buttocks Right;Mid;Lower Stage 2 -  Partial thickness loss of dermis presenting as a shallow open injury with a red, pink wound bed without slough. Pink stage 2 to right buttock below the upper stage 2 (Active)  07/10/23 1610  Location: Buttocks  Location Orientation: Right;Mid;Lower  Staging: Stage 2 -  Partial thickness loss of dermis presenting as a shallow open injury with a red, pink wound bed without slough.  Wound Description (Comments): Pink stage 2 to right buttock below the upper stage 2  Present on Admission: Yes     Pressure Injury 07/10/23 Buttocks Left;Mid;Lower Stage 2 -  Partial thickness loss of dermis presenting as a shallow open injury with a red, pink wound bed without slough. Pink stage 2 to left buttock (Active)  07/10/23 1611  Location: Buttocks  Location Orientation: Left;Mid;Lower  Staging: Stage 2 -  Partial thickness loss of dermis presenting as a shallow open injury with a red, pink wound bed without slough.  Wound Description (Comments): Pink stage 2 to left buttock  Present on Admission:  Yes    Physical Exam: Vital Signs Blood pressure (!) 146/80, pulse 98, temperature 97.6 F (36.4 C), resp. rate 18, height 5\' 9"  (1.753 m), weight 99.8 kg, SpO2 100%.  General: No acute distress Mood and affect are appropriate Heart: Regular rate and rhythm no rubs murmurs or extra sounds Lungs: Clear to auscultation, breathing unlabored, no rales or wheezes Abdomen: Positive bowel sounds, soft nontender to palpation, nondistended Nephrostomy with blood-tinged urine output-improved from yesterday Extremities: No clubbing, cyanosis, or edema Skin: No evidence of breakdown, no evidence of rash Neurologic: Cranial nerves II through XII intact, motor strength is 4/5 in rght 3- left  deltoid, bicep, tricep, grip,3- RIght 4- left  hip flexor, knee extensors,  4/4 B ankle dorsiflexor and plantar flexor   -Buttocks MASD--not visualized this am     MSK:    Both knees with effusions, tight. Left shoulder tight in ER/IR/F/E  Pain more with IR/ER than any movement. Was able to abduct to 90+ degrees       Sitting in wheelchair with therapy      Assessment/Plan: 1. Functional deficits which require 3+ hours per day of interdisciplinary therapy in a comprehensive inpatient rehab setting. Physiatrist is providing close team supervision and 24 hour management of active medical problems listed below. Physiatrist and rehab team continue to assess barriers to discharge/monitor patient progress toward functional and medical goals  Care Tool:  Bathing  Bathing activity did not occur:  (patient completed a simulated task at bed LOF) Body parts bathed by patient: Right arm, Left arm, Chest, Abdomen, Front perineal area, Right upper leg, Left upper leg, Face   Body parts bathed by helper: Buttocks, Left lower leg, Right lower leg     Bathing assist Assist Level: Maximal Assistance - Patient 24 - 49%     Upper Body Dressing/Undressing Upper body dressing   What is the patient wearing?: Pull over shirt    Upper body assist Assist Level: Minimal Assistance - Patient > 75%    Lower Body Dressing/Undressing Lower body dressing      What is the patient wearing?: Pants, Underwear/pull up, Incontinence brief     Lower body assist Assist for lower body dressing: Maximal Assistance - Patient 25 - 49%     Toileting Toileting    Toileting assist Assist for toileting: 2 Helpers     Transfers Chair/bed transfer  Transfers assist  Chair/bed transfer activity did not occur: Safety/medical concerns (not safe to get up)  Chair/bed transfer assist level: Moderate Assistance - Patient 50 - 74%     Locomotion Ambulation   Ambulation assist   Ambulation activity did not occur: Safety/medical concerns          Walk 10 feet  activity   Assist  Walk 10 feet activity did not occur: Safety/medical concerns        Walk 50 feet activity   Assist Walk 50 feet with 2 turns activity did not occur: Safety/medical concerns         Walk 150 feet activity   Assist Walk 150 feet activity did not occur: Safety/medical concerns         Walk 10 feet on uneven surface  activity   Assist Walk 10 feet on uneven surfaces activity did not occur: Safety/medical concerns         Wheelchair     Assist Is the patient using a wheelchair?: Yes Type of Wheelchair: Manual    Wheelchair assist level: Dependent - Patient 0% Max  wheelchair distance: 150'    Wheelchair 50 feet with 2 turns activity    Assist        Assist Level: Dependent - Patient 0%   Wheelchair 150 feet activity     Assist      Assist Level: Dependent - Patient 0%   Blood pressure (!) 146/80, pulse 98, temperature 97.6 F (36.4 C), resp. rate 18, height 5\' 9"  (1.753 m), weight 99.8 kg, SpO2 100%.   Medical Problem List and Plan: 1. Functional deficits secondary to debility related to septic shock/C. difficile colitis/AKI superimposed on CKD stage IV             -patient may shower with tubes/drains covered             -ELOS/Goals:  3/28, min assist PT/OT             -Continue CIR therapies including PT, OT     -Team conference tomorrow  2.  Antithrombotics: -DVT/anticoagulation:  Pharmaceutical: Eliquis             -antiplatelet therapy: N/A   3. Pain Management: tramadol prn, Zanaflex 4 mg twice daily as needed   Biliateral OA of knees  -ordered bilateral neoprine knee sleeves --try today  -voltaren gel tid to knees  -have scheduled tramadol 50mg  at 0700 and 1200 daily. Continue prn as well  Mild adhesive capsulitis left shoulder---added sports cream. Can use voltaren gel also. Aggressive ROM with therapy and while in bed 4. Mood/Behavior/Sleep: Trazodone 50 mg nightly as needed.  Provide emotional  support             -antipsychotic agents: N/A   5. Neuropsych/cognition: This patient is capable of making decisions on his own behalf.   6. Skin/Wound Care: Routine skin checks   7. Fluids/Electrolytes/Nutrition:    -3/14 po intake remains poor, albumin low, weights falling  -will ask RD for assistance. Have spoken to patient about intake  -?IV albumin (see below)  3/18 discussed with dietitian, patient recorded is eating 0% of his meals.  Appears that wife bringing in 3 meals a day and he is eating about 75%, appears to have fairly adequate intake. 8.  Oliguric AKI superimposed on CKD stage IV.  Baseline creatinine 4.9.  CRRT discontinued 2/25.  No current plan for long-term dialysis.  Follow-up renal services              - Per discussion with nephrology Dr. Verna Czech, keep Foley catheter in to allow accurate I's and O's through Monday; then, can remove and do DC Foley trial while continuing to document strict outputs             - Continue daily assessments of renal function per nephrology   3/8- per renal, Cr leveling out- and con't Foley- will remove Monday  -nephrology following, Cr remains in 4's.   3/14 renal function with some improvement today     -suspect lower extremity edema is related to renal function/low albumin   Appreciate nephro input   -3/17 BUN/creatinine slightly improved 34/3.31, appreciate nephrology assistance  Recheck labs tomorrow ordered 9.  C. difficile colitis.  Completed course of oral vancomycin 3/7.  DC'd IV Flagyl 3/3. Enteric precautions   3/14 stools formed this morning   3/17 discussed with ID pharmacy, patient having more frequent bowel movements.  Monitor today and if continues consider Dificid treatment  3/18 diarrhea appears to be improving, continue to monitor for now 10.  Status post left nephrostomy tube  d/t staghorn calculi. Has Foley Nephrostomy tube placed 1/29 per IR with plan for ureteric stent placement 3/21              - Careful  monitoring of nephrostomy output  -3/14 flomax has been added to improve emptying   -spoke to Dr. Mena Goes today who will perform endoscopy on 3/21 to initially break up calculi/ remove nephrostomy tube. No stent apparently    -he requested prophylactic keflex 250mg  bid beginning 3/17    Still on enteric prec  -Contact IR today regarding blood in nephrostomy-understandable given calculi that can cause irritation, IR PA to evaluate to make sure it is functioning correctly, appreciate assistance  -3/18 nephrostomy tube with good output, only blood-tinged urine today.  Appears to be improving continue to monitor.  Urine culture pending 11.  Chronic atrial fibrillation with episodes of bradycardia.  Followed by cardiology services.  Continue Eliquis.  Avoid AV nodal blocking agents   12.  Diabetes mellitus.  Hemoglobin A1c 6.2.    -tightly controlled. Dc SSI and change cbg checks to qam only   CBG (last 3)  Recent Labs    07/20/23 2152 07/21/23 0553 07/21/23 1142  GLUCAP 144* 89 129*    Controlled diet 3/16  3/18 well-controlled continue to monitor    13.  Hypotension.  ProAmatine 10 mg 3 times daily.  Monitor with increased mobility   - Normotensive on admission  3/9- BP running 100-s to 120s systolic- con't regimen  3/18 BP appears stable continue to monitor     07/21/2023    1:36 PM 07/21/2023    5:54 AM 07/21/2023    5:05 AM  Vitals with BMI  Weight  220 lbs   BMI  32.48   Systolic 146  125  Diastolic 80  77  Pulse 98  91    14.  Chronic anemia.  Niferex daily/Aranesp.  Monitor for any bleeding episodes   3/8- Hb 7.1 today- will order transfusion- transfusion of 1 unit pRBCs.   3/14 hgb up to 9.1  -3/17 hemoglobin down to 8.4, continue to monitor  Recheck tomorrow    16. Leukocytosis  3/9- Pt's WBC is 21.7- up from 16k- is afebrile- due to his recent C diff, that just finished correct dosing of ABX for, called ID for guidance on treatment- don't want to cause more Cdiff- ID  consulted ---no new orders  3/10 WBC's down to 17k  --have been hovering above and below 20 for awhile. No new clinical symptoms. Nephrostomy drainage clear, yellow  3/14 wbc's down to 13k.--recheck Monday   3/17 WBC is down to 12.4  Recheck tomorrow      LOS: 11 days A FACE TO FACE EVALUATION WAS PERFORMED  Fanny Dance 07/21/2023, 1:40 PM

## 2023-07-22 DIAGNOSIS — Z794 Long term (current) use of insulin: Secondary | ICD-10-CM

## 2023-07-22 DIAGNOSIS — F39 Unspecified mood [affective] disorder: Secondary | ICD-10-CM

## 2023-07-22 LAB — CBC
HCT: 27.8 % — ABNORMAL LOW (ref 39.0–52.0)
Hemoglobin: 8.3 g/dL — ABNORMAL LOW (ref 13.0–17.0)
MCH: 26.8 pg (ref 26.0–34.0)
MCHC: 29.9 g/dL — ABNORMAL LOW (ref 30.0–36.0)
MCV: 89.7 fL (ref 80.0–100.0)
Platelets: 394 10*3/uL (ref 150–400)
RBC: 3.1 MIL/uL — ABNORMAL LOW (ref 4.22–5.81)
RDW: 16 % — ABNORMAL HIGH (ref 11.5–15.5)
WBC: 13.4 10*3/uL — ABNORMAL HIGH (ref 4.0–10.5)
nRBC: 0 % (ref 0.0–0.2)

## 2023-07-22 LAB — BASIC METABOLIC PANEL
Anion gap: 8 (ref 5–15)
BUN: 34 mg/dL — ABNORMAL HIGH (ref 8–23)
CO2: 19 mmol/L — ABNORMAL LOW (ref 22–32)
Calcium: 8.4 mg/dL — ABNORMAL LOW (ref 8.9–10.3)
Chloride: 111 mmol/L (ref 98–111)
Creatinine, Ser: 3.24 mg/dL — ABNORMAL HIGH (ref 0.61–1.24)
GFR, Estimated: 20 mL/min — ABNORMAL LOW (ref >60)
Glucose, Bld: 91 mg/dL (ref 70–99)
Potassium: 3.6 mmol/L (ref 3.5–5.1)
Sodium: 138 mmol/L (ref 135–145)

## 2023-07-22 LAB — GLUCOSE, CAPILLARY
Glucose-Capillary: 97 mg/dL (ref 70–99)
Glucose-Capillary: 157 mg/dL — ABNORMAL HIGH (ref 70–99)
Glucose-Capillary: 157 mg/dL — ABNORMAL HIGH (ref 70–99)

## 2023-07-22 MED ORDER — MIDODRINE HCL 5 MG PO TABS: 5.0000 mg | ORAL_TABLET | Freq: Three times a day (TID) | ORAL | Status: DC | PRN

## 2023-07-22 NOTE — Progress Notes (Signed)
 Patient ID: Calvin Schultz, male   DOB: 08-23-1950, 73 y.o.   MRN: [id number] Spoke with [facility] to arrange transport for Friday surgery. Scheduled for pick up at 11;15 wanted to do a little before need to be there at 12:00. Surgery is at 2:00 with Dr. Mena Goes. Will let bedside RN know of pick up time.

## 2023-07-22 NOTE — Patient Care Conference (Signed)
 Inpatient RehabilitationTeam Conference and Plan of Care Update Date: 07/22/2023   Time: 12:13 PM    Patient Name: Calvin Schultz      Medical Record Number: [id number]  Date of Birth: 01/12/1951 Sex: Male         Room/Bed: 4M01C/4M01C-01 Payor Info: Payor: [employer] / Plan: [place] PREFERRED / Product Type: *No Product type* /    Admit Date/Time:  07/10/2023  2:55 PM  Primary Diagnosis:  Debility  Hospital Problems: Principal Problem:   Debility    Expected Discharge Date: Expected Discharge Date: 08/07/23  Team Members Present: Physician leading conference: Dr. Fanny Dance Social Worker Present: Dossie Der, LCSW Nurse Present: Chana Bode, RN PT Present: Ambrose Finland, PT OT Present: Limmie Patricia, OT PPS Coordinator present : Fae Pippin, SLP     Current Status/Progress Goal Weekly Team Focus  Bowel/Bladder   L nephrostomy tube in place, Q4-6 bladder scan and I/O cath   Regain continence   Continue voiding trails    Swallow/Nutrition/ Hydration               ADL's   Min A for UB care (painful L-shoulder); Max-Total A for LB care (painful B-knees and restricted ROM); slide-board transfer with Min-mod A 1-2 person   Setup/supervision for UB care; Min A for LB care & functional transfers   functional transfers, core strength/stability, LE strengthening    Mobility   bed mobility min A with use of bed features, slide board transfer with +1 min A to +2 max A (pt level of assist varies with pain/fatigue). Sit to stand with +1 max A with use of steady, progressed today to sit to stand with use of bed rail and mod A holding for 10-20 seconds. Initiated gait training with use of sara plus, pt able to perform heel to raises but unable to get enough clearance to advance LE.   min A  follow up HHPT: DME; TBD closer to DC --currently hospital bed, slide board. power WC eval scheduled for 3/25. Pt D/C currently scheduled for 3/28. Pt would benefit from extension to 4/4  for improved independence with transfers and reduced burden of care.    Communication                Safety/Cognition/ Behavioral Observations               Pain   pt c/o bilateral knee pain 8/9-10. prn and scheduled meds given   <3 on pain scale   Assess qshift and prn    Skin   Stage 2 healing on bilateral buttocks   promote healing  Assess qshift and prn      Discharge Planning:  Pt making progress slowly-limited by knees and shoulder issues. Wife wants to take home but needs to be able to provide the care he will need. Will need extension if this is a possibility   Team Discussion: Patient post C-diff with debility, Nephrostomy tube; hematuria, Dr. Ansel Bong aware; planning stent placement 07/24/23. Progress limited by knee pain and decreased ROM (decreased knee and hip extension) and fatigue.  Patient on target to meet rehab goals: Currently needs min assist for upper body care and max assist for lower body care. Needs min assist for bed mobility using bed features. Needs min - mod assist to transfer using a slide board to the right and mod - max assist to the left. Able to complete sit - stand without an assistive device with mod assist. Goals for discharge set  for supervison- min assist overall.  *See Care Plan and progress notes for long and short-term goals.   Revisions to Treatment Plan:  Stress management class Power wheelchair eval   Teaching Needs: Safety, medications, transfers, toileting, skin care, etc.   Current Barriers to Discharge: Decreased caregiver support, Home enviroment access/layout, and Wound care  Possible Resolutions to Barriers: Family education HH follow up services DME: power wheelchair     Medical Summary Current Status: AKI, C diff, DM2, Hypotension, incontinence, anemia, leukocytosis, nephrostomy tube  Barriers to Discharge: Hypotension;Medical stability;Uncontrolled Pain;Self-care education;Incontinence  Barriers to Discharge  Comments: AKI, C diff, DM2, Hypotension, incontinence, anemia, leukocytosis, nephrostomy tube Possible Resolutions to [employer] Focus: Urology procedure friday, Monitor CBGs, monitor BP, pain medications, monitor bowel function   Continued Need for Acute Rehabilitation Level of Care: The patient requires daily medical management by a physician with specialized training in physical medicine and rehabilitation for the following reasons: Direction of a multidisciplinary physical rehabilitation program to maximize functional independence : Yes Medical management of patient stability for increased activity during participation in an intensive rehabilitation regime.: Yes Analysis of laboratory values and/or radiology reports with any subsequent need for medication adjustment and/or medical intervention. : Yes   I attest that I was present, lead the team conference, and concur with the assessment and plan of the team.   Chana Bode B 07/22/2023, 2:41 PM

## 2023-07-22 NOTE — Progress Notes (Signed)
 Physical Therapy Session Note  Patient Details  Name: Calvin Schultz MRN: [id number] Date of Birth: August 14, 1950  Today's Date: 07/22/2023 PT Individual Time: 0915-1003, 2440-1027 PT Individual Time Calculation (min): 48 min, 74 min   Short Term Goals: Week 2:  PT Short Term Goal 1 (Week 2): pt will initiate gait training PT Short Term Goal 2 (Week 2): pt will complete sit to stand with LRAD and mod A or less PT Short Term Goal 3 (Week 2): Pt will complete bed to chair transfer with mod A or less consistently PT Short Term Goal 4 (Week 2): pt will perform custom WC eval  Skilled Therapeutic Interventions/Progress Updates:      Treatment Session 1  Pt seated in TIS WC upon arrival. Pt agreeable to therapy. Pt reports B Knee pain, not quantified. Pt denies need for pain medicine. Therapist provided rest breaks and positioning as needed.   Pt performed sit to stand x3 from TIS [facility] with B UE support on foot board of hospital bed and therpaist providing mod A, holding for 10 seconds on first two trials and 30 seconds on 3rd trail, vebrla and tactile cues provided for hip/knee extension.   Pt wife reports pt was depressed after initiating conversation for power WC yesterday 2/2 concerns he will not be walking again.   Extensive education provided to pt and caregiver regarding pros of power WC as tool to improve pt overall independence and reduce caregiver burden with overall mobility/locomotion, pressure relief. Emphasized therapy will continue to progress pt standing tolerance and gait training but therapy is anticipating pt to be able to work short distances only upon 2/2 endurance/pain deficits. Pt verbalized understanding. Emphasized pt will continue to progress with follow up therapy upon discharge.   Introduced power WC. Pt performed slide board transfer TIS to bed with max A to L. Therapist reinflated roho cushion for adequate pressure relief. Pt performed slide board transfer bed to power WC  with min A, pt required min-mod A for posterior scooting into WC.   Education and demonstration provided for power controls on power WC. Pt will benefit from continued practice.   Pt seated in power WC at end of session with all needs within reach and seatbelt on.   Treatment Session 2   Pt seated in power WC upon arrival. Pt agreeable to therapy.   Pt performed slide board transfer power WC to mat table, TIS to mat table, mat table to power WC, power WC to hospital bed with min A to R and mod A to L, verbal cues provided for technique, education provided for use of power tilt control to assist with posterior scoot back into chair.   Pt propelled power WC in hallways and room, and positioned next to [facility] with supervision/min A, verbal cues provided for sequencing and technique.   Pt ambulated 1x5, 1x8, 1x18 steps with use of steady plus and +2, pt required seated rest break between trials 2/2 B knee pain.   Pt performed sit to supine with use min A for assist with B LE.   Pt supine in bed at end of session with all needs within reach and bed alarm on.   Therapy Documentation Precautions:  Precautions Precautions: Fall Precaution/Restrictions Comments: L nephrostomy tube Restrictions Weight Bearing Restrictions Per Provider Order: No   Therapy/Group: Individual Therapy  [facility] Ambrose Finland, [place], DPT  07/22/2023, 7:54 AM

## 2023-07-22 NOTE — Progress Notes (Signed)
 Occupational Therapy Session Note  Patient Details  Name: Calvin Schultz MRN: [id number] Date of Birth: [birth year]  {CHL IP REHAB OT TIME CALCULATIONS:[id number]}   Short Term Goals: Week 2:  OT Short Term Goal 1 (Week 2): Pt will complete LB dressing while seated on EOB with Mod A while utilizing AE as needed. OT Short Term Goal 2 (Week 2): Pt will consistently demonstrate lateral scoot transfer at Min A with use of SB when transferring from surface to surface.  Skilled Therapeutic Interventions/Progress Updates:    Patient agreeable to participate in OT session. Reports *** pain level.   Patient participated in skilled OT session focusing on ***. Therapist facilitated/assessed/developed/educated/integrated/elicited *** in order to improve/facilitate/promote    Therapy Documentation Precautions:  Precautions Precautions: Fall Precaution/Restrictions Comments: L nephrostomy tube Restrictions Weight Bearing Restrictions Per Provider Order: No   Therapy/Group: Individual Therapy  Limmie Patricia, OTR/L,CBIS  Supplemental OT - [facility] and [facility] Secure Chat Preferred   07/22/2023, 8:32 PM

## 2023-07-22 NOTE — Progress Notes (Addendum)
 PROGRESS NOTE   Subjective/Complaints: Working with therapy. Pain in knees continues but overall under control. No new complaints this AM.   Therapy reported he has endorsed decreased mood.    ROS: Patient denies abd pain , no breathing problems, no N/V + diarrhea-improved + chronic knee pain  Objective:   No results found.  Recent Labs    07/21/23 0501 07/22/23 0518  WBC 13.6* 13.4*  HGB 8.3* 8.3*  HCT 28.1* 27.8*  PLT 375 394   Recent Labs    07/20/23 0448 07/22/23 0518  NA 137 138  K 4.1 3.6  CL 112* 111  CO2 18* 19*  GLUCOSE 121* 91  BUN 34* 34*  CREATININE 3.31* 3.24*  CALCIUM 8.1* 8.4*    Intake/Output Summary (Last 24 hours) at 07/22/2023 1013 Last data filed at 07/22/2023 0749 Gross per 24 hour  Intake 240 ml  Output 600 ml  Net -360 ml     Pressure Injury 07/10/23 Buttocks Right;Mid;Upper Stage 2 -  Partial thickness loss of dermis presenting as a shallow open injury with a red, pink wound bed without slough. Pink opened stage 2 right upper buttock (Active)  07/10/23 1608  Location: Buttocks  Location Orientation: Right;Mid;Upper  Staging: Stage 2 -  Partial thickness loss of dermis presenting as a shallow open injury with a red, pink wound bed without slough.  Wound Description (Comments): Pink opened stage 2 right upper buttock  Present on Admission: Yes     Pressure Injury 07/10/23 Buttocks Right;Mid;Upper Stage 2 -  Partial thickness loss of dermis presenting as a shallow open injury with a red, pink wound bed without slough. Pink stage 2 directly beside the right upper stage 2 (Active)  07/10/23 1609  Location: Buttocks  Location Orientation: Right;Mid;Upper  Staging: Stage 2 -  Partial thickness loss of dermis presenting as a shallow open injury with a red, pink wound bed without slough.  Wound Description (Comments): Pink stage 2 directly beside the right upper stage 2  Present on  Admission: Yes     Pressure Injury 07/10/23 Buttocks Right;Mid;Lower Stage 2 -  Partial thickness loss of dermis presenting as a shallow open injury with a red, pink wound bed without slough. Pink stage 2 to right buttock below the upper stage 2 (Active)  07/10/23 1610  Location: Buttocks  Location Orientation: Right;Mid;Lower  Staging: Stage 2 -  Partial thickness loss of dermis presenting as a shallow open injury with a red, pink wound bed without slough.  Wound Description (Comments): Pink stage 2 to right buttock below the upper stage 2  Present on Admission: Yes     Pressure Injury 07/10/23 Buttocks Left;Mid;Lower Stage 2 -  Partial thickness loss of dermis presenting as a shallow open injury with a red, pink wound bed without slough. Pink stage 2 to left buttock (Active)  07/10/23 1611  Location: Buttocks  Location Orientation: Left;Mid;Lower  Staging: Stage 2 -  Partial thickness loss of dermis presenting as a shallow open injury with a red, pink wound bed without slough.  Wound Description (Comments): Pink stage 2 to left buttock  Present on Admission: Yes    Physical Exam: Vital Signs Blood pressure  133/66, pulse 67, temperature 97.8 F (36.6 C), temperature source Oral, resp. rate 18, height 5\' 9"  (1.753 m), weight 99.8 kg, SpO2 100%.  General: No acute distress Mood and affect are appropriate Heart: Regular rate and rhythm no rubs murmurs or extra sounds Lungs: Clear to auscultation, breathing unlabored, no rales or wheezes Abdomen: Positive bowel sounds, soft nontender to palpation, nondistended Nephrostomy with blood-tinged urine output- a little improved from yesterday Extremities: No clubbing, cyanosis, or edema Skin: No evidence of breakdown, no evidence of rash Neurologic: Cranial nerves II through XII intact, motor strength is 4/5 in rght 3- left  deltoid, bicep, tricep, grip,3- RIght 4- left  hip flexor, knee extensors, 4/4 B ankle dorsiflexor and plantar  flexor   -Buttocks MASD--not visualized this am     MSK:    Both knees with effusions, tight. Left shoulder tight in ER/IR/F/E  Pain more with IR/ER than any movement. Was able to abduct to 90+ degrees       Sitting in wheelchair with therapy      Assessment/Plan: 1. Functional deficits which require 3+ hours per day of interdisciplinary therapy in a comprehensive inpatient rehab setting. Physiatrist is providing close team supervision and 24 hour management of active medical problems listed below. Physiatrist and rehab team continue to assess barriers to discharge/monitor patient progress toward functional and medical goals  Care Tool:  Bathing  Bathing activity did not occur:  (patient completed a simulated task at bed LOF) Body parts bathed by patient: Right arm, Left arm, Chest, Abdomen, Front perineal area, Right upper leg, Left upper leg, Face   Body parts bathed by helper: Buttocks, Left lower leg, Right lower leg     Bathing assist Assist Level: Maximal Assistance - Patient 24 - 49%     Upper Body Dressing/Undressing Upper body dressing   What is the patient wearing?: Pull over shirt    Upper body assist Assist Level: Minimal Assistance - Patient > 75%    Lower Body Dressing/Undressing Lower body dressing      What is the patient wearing?: Pants, Underwear/pull up, Incontinence brief     Lower body assist Assist for lower body dressing: Maximal Assistance - Patient 25 - 49%     Toileting Toileting    Toileting assist Assist for toileting: 2 Helpers     Transfers Chair/bed transfer  Transfers assist  Chair/bed transfer activity did not occur: Safety/medical concerns (not safe to get up)  Chair/bed transfer assist level: Moderate Assistance - Patient 50 - 74%     Locomotion Ambulation   Ambulation assist   Ambulation activity did not occur: Safety/medical concerns          Walk 10 feet activity   Assist  Walk 10 feet activity did not  occur: Safety/medical concerns        Walk 50 feet activity   Assist Walk 50 feet with 2 turns activity did not occur: Safety/medical concerns         Walk 150 feet activity   Assist Walk 150 feet activity did not occur: Safety/medical concerns         Walk 10 feet on uneven surface  activity   Assist Walk 10 feet on uneven surfaces activity did not occur: Safety/medical concerns         Wheelchair     Assist Is the patient using a wheelchair?: Yes Type of Wheelchair: Manual    Wheelchair assist level: Dependent - Patient 0% Max wheelchair distance: 150'  Wheelchair 50 feet with 2 turns activity    Assist        Assist Level: Dependent - Patient 0%   Wheelchair 150 feet activity     Assist      Assist Level: Dependent - Patient 0%   Blood pressure 133/66, pulse 67, temperature 97.8 F (36.6 C), temperature source Oral, resp. rate 18, height 5\' 9"  (1.753 m), weight 99.8 kg, SpO2 100%.   Medical Problem List and Plan: 1. Functional deficits secondary to debility related to septic shock/C. difficile colitis/AKI superimposed on CKD stage IV             -patient may shower with tubes/drains covered             -ELOS/Goals:  3/28, min assist PT/OT             -Continue CIR therapies including PT, OT     -Team conference today please see physician documentation under team conference tab, met with team  to discuss problems,progress, and goals. Formulized individual treatment plan based on medical history, underlying problem and comorbidities.    2.  Antithrombotics: -DVT/anticoagulation:  Pharmaceutical: Eliquis             -antiplatelet therapy: N/A   3. Pain Management: tramadol prn, Zanaflex 4 mg twice daily as needed   Biliateral OA of knees  -ordered bilateral neoprine knee sleeves --try today  -voltaren gel tid to knees  -have scheduled tramadol 50mg  at 0700 and 1200 daily. Continue prn as well  Mild adhesive capsulitis left  shoulder---added sports cream. Can use voltaren gel also. Aggressive ROM with therapy and while in bed 4. Mood/Behavior/Sleep: Trazodone 50 mg nightly as needed.  Provide emotional support             -antipsychotic agents: N/A   5. Neuropsych/cognition: This patient is capable of making decisions on his own behalf.   6. Skin/Wound Care: Routine skin checks   7. Fluids/Electrolytes/Nutrition:    -3/14 po intake remains poor, albumin low, weights falling  -will ask RD for assistance. Have spoken to patient about intake  -?IV albumin (see below)  3/18 discussed with dietitian, patient recorded is eating 0% of his meals.  Appears that wife bringing in 3 meals a day and he is eating about 75%, appears to have fairly adequate intake. 8.  Oliguric AKI superimposed on CKD stage IV.  Baseline creatinine 4.9.  CRRT discontinued 2/25.  No current plan for long-term dialysis.  Follow-up renal services              - Per discussion with nephrology Dr. Verna Czech, keep Foley catheter in to allow accurate I's and O's through Monday; then, can remove and do DC Foley trial while continuing to document strict outputs             - Continue daily assessments of renal function per nephrology   3/8- per renal, Cr leveling out- and con't Foley- will remove Monday  -nephrology following, Cr remains in 4's.   3/14 renal function with some improvement today     -suspect lower extremity edema is related to renal function/low albumin   Appreciate nephro input   -3/17 BUN/creatinine slightly improved 34/3.31, appreciate nephrology assistance  3/19 BUN/CR stable at 34/3.24, nephrology signing off, ok to check BMET twice a week 9.  C. difficile colitis.  Completed course of oral vancomycin 3/7.  DC'd IV Flagyl 3/3. Enteric precautions   3/14 stools formed this morning   3/17  discussed with ID pharmacy, patient having more frequent bowel movements.  Monitor today and if continues consider Dificid treatment  3/18 diarrhea  appears to be improving, continue to monitor for now  3/19 Frequent Bms improved, continue to follow 10.  Status post left nephrostomy tube d/t staghorn calculi. Has Foley Nephrostomy tube placed 1/29 per IR with plan for ureteric stent placement 3/21              - Careful monitoring of nephrostomy output  -3/14 flomax has been added to improve emptying   -spoke to Dr. Mena Goes today who will perform endoscopy on 3/21 to initially break up calculi/ remove nephrostomy tube. No stent apparently    -he requested prophylactic keflex 250mg  bid beginning 3/17    Still on enteric prec  -Contact IR today regarding blood in nephrostomy-understandable given calculi that can cause irritation, IR PA to evaluate to make sure it is functioning correctly, appreciate assistance  -3/18 nephrostomy tube with good output, only blood-tinged urine today.  Appears to be improving continue to monitor.  Urine culture pending  -3/19 blood tinged urine in tube today, continue current 11.  Chronic atrial fibrillation with episodes of bradycardia.  Followed by cardiology services.  Continue Eliquis.  Avoid AV nodal blocking agents   12.  Diabetes mellitus.  Hemoglobin A1c 6.2.    -tightly controlled. Dc SSI and change cbg checks to qam only   CBG (last 3)  Recent Labs    07/21/23 1142 07/21/23 1648 07/22/23 0653  GLUCAP 129* 115* 97    Controlled diet 3/16  3/19  CBGs controlled, continue to monitor     13.  Hypotension.  ProAmatine 10 mg 3 times daily.  Monitor with increased mobility   - Normotensive on admission  3/9- BP running 100-s to 120s systolic- con't regimen  3/19 BP has been on high side, will change midodrine to 5mg  TID PRN orthostatic hypotension     07/21/2023    9:05 PM 07/21/2023    1:36 PM 07/21/2023    5:54 AM  Vitals with BMI  Weight   220 lbs  BMI   32.48  Systolic 133 146   Diastolic 66 80   Pulse 67 98     14.  Chronic anemia.  Niferex daily/Aranesp.  Monitor for any bleeding  episodes   3/8- Hb 7.1 today- will order transfusion- transfusion of 1 unit pRBCs.   3/14 hgb up to 9.1  -3/17 hemoglobin down to 8.4, continue to monitor  -3/19 HGB 8.3 today, stable overall, continue to monitor     16. Leukocytosis  3/9- Pt's WBC is 21.7- up from 16k- is afebrile- due to his recent C diff, that just finished correct dosing of ABX for, called ID for guidance on treatment- don't want to cause more Cdiff- ID consulted ---no new orders  3/10 WBC's down to 17k  --have been hovering above and below 20 for awhile. No new clinical symptoms. Nephrostomy drainage clear, yellow  3/14 wbc's down to 13k.--recheck Monday   3/17 WBC is down to 12.4  3/19 WBC 13.4 today, continue to monitor 17. Decreased mood reported by therapy  -Consider SSRI    LOS: 12 days A FACE TO FACE EVALUATION WAS PERFORMED  Fanny Dance 07/22/2023, 10:13 AM

## 2023-07-22 NOTE — Progress Notes (Signed)
 [facility] NEPHROLOGY PROGRESS NOTE  Assessment/ Plan: Pt is a 73 y.o. yo male    # AKI on CKD 4 - AKI is ischemic and pre-renal insults related to hypotension +/- ARB and intravasc vol depletion.  CT Schultz/o obstruction, has L neph tube.  He was on CRRT from 2/22 - 2/25.  Note that Cr 4.06- 5.3 from jan 2025, eGFR 11-15 ml/min.  For reference in 01/2022 with Cr 2.48 from [facility] Everywhere Cont holding HD and watching labs and UOP; creatinine is leveling trending down, increasing urine output. No vascular access currently Removed foley catheter (CIC 3-4x/d), has lt PCN No Need for HD today. Have made great strides the last 7 days in volume and clearance. Renal function was slowly improving; no labs today.  Renal function very stable and possibly at his baseline given Cr was 2.48 in 01/2022. Signing off at this time; please reconsult as needed. No need to check BMet every day. Probably twice a week should be sufficient. He has been very stable.   BP/volume-  still overall overloaded but have made progress-  no diuretic-  will watch and add on diuretic if needed  net neg 12.2L during this hospitalization.  #L nephrostomy tube - placed in Jan 2025 by IR, and is f/b urology. Has staghorn calculi on the L and had previously been scheduled to have stone removed and stent placed per his wife on March 7th at [facility]. Postponed #Urinary retention - as above, per CIR #Chronic atrial fib - on eliquis; per primary team  #Hypophosphatemia secondary to CRRT - resolved #Hypokalemia -  resolved #Anemia normocytic: cont ESA; PRBC's per primary team  Subjective: No new event.  Urine output 1.6/1/0.8 L . Lt PCN and CIC 3-4x/day Denies nausea, vomiting, chest pain, shortness of breath.   Crt in the 3's and slowly improving.  Objective Vital signs in last 24 hours: Vitals:   07/21/23 0505 07/21/23 0554 07/21/23 1336 07/21/23 2105  BP: 125/77  (!) 146/80 133/66  Pulse: 91  98 67  Resp: 16  18  18   Temp: 98 F (36.7 C)  97.6 F (36.4 C) 97.8 F (36.6 C)  TempSrc:   Oral Oral  SpO2: 99%  100%   Weight:  99.8 kg    Height:       Weight change:   Intake/Output Summary (Last 24 hours) at 07/22/2023 9562 Last data filed at 07/22/2023 1308 Gross per 24 hour  Intake 240 ml  Output 800 ml  Net -560 ml       Labs: RENAL PANEL Recent Labs  Lab 07/17/23 0856 07/18/23 0534 07/19/23 0615 07/20/23 0448 07/22/23 0518  NA 136 136 137 137 138  K 3.7 3.8 3.8 4.1 3.6  CL 109 110 110 112* 111  CO2 19* 18* 21* 18* 19*  GLUCOSE 88 125* 113* 121* 91  BUN 42* 40* 37* 34* 34*  CREATININE 3.88* 3.61* 3.36* 3.31* 3.24*  CALCIUM 8.6* 7.9* 8.2* 8.1* 8.4*  PHOS  --   --  4.9*  --   --   ALBUMIN  --   --  1.9* 1.9*  --     Liver Function Tests: Recent Labs  Lab 07/19/23 0615 07/20/23 0448  AST  --  16  ALT  --  16  ALKPHOS  --  74  BILITOT  --  0.3  PROT  --  5.1*  ALBUMIN 1.9* 1.9*    No results for input(s): "LIPASE", "AMYLASE" in the last  168 hours. No results for input(s): "AMMONIA" in the last 168 hours. CBC: Recent Labs    06/03/23 0647 06/04/23 0503 06/04/23 1126 06/05/23 0608 07/03/23 1514 07/04/23 0140 07/15/23 0504 07/17/23 0856 07/20/23 0448 07/21/23 0501 07/22/23 0518  HGB 10.2*   < >  --    < >  --    < > 8.1* 9.1* 8.4* 8.3* 8.3*  MCV 85.5   < >  --    < >  --    < > 88.5 90.4 89.0 89.8 89.7  FERRITIN  --   --  32  --  88  --   --   --   --   --   --   TIBC 384  --   --   --  179*  --   --   --   --   --   --   IRON 45  --   --   --  35*  --   --   --   --   --   --    < > = values in this interval not displayed.    Cardiac Enzymes: No results for input(s): "CKTOTAL", "CKMB", "CKMBINDEX", "TROPONINI" in the last 168 hours. CBG: Recent Labs  Lab 07/20/23 2152 07/21/23 0553 07/21/23 1142 07/21/23 1648 07/22/23 0653  GLUCAP 144* 89 129* 115* 97    Iron Studies: No results for input(s): "IRON", "TIBC", "TRANSFERRIN", "FERRITIN" in the  last 72 hours. Studies/Results: No results found.  Medications: Infusions:   Scheduled Medications:  apixaban  5 mg Oral BID   cephALEXin  250 mg Oral BID   darbepoetin (ARANESP) injection - NON-DIALYSIS  40 mcg Subcutaneous Q Fri-1800   diclofenac Sodium  2 g Topical QID   iron polysaccharides  150 mg Oral Daily   midodrine  10 mg Oral TID WC   multivitamin  1 tablet Oral QHS   Muscle Rub   Topical TID   tamsulosin  0.4 mg Oral QPC supper   traMADol  50 mg Oral BID    have reviewed scheduled and prn medications.  Physical Exam: General:NAD, comfortable Heart:RRR, s1s2 nl Lungs:clear b/l, no crackle Abdomen:soft, Non-tender, non-distended Extremities:has edema but looks good with compression hose on  Back: lt PCN Dialysis Access: None. GU: no foley  Calvin Schultz 07/22/2023,7:12 AM  LOS: 12 days

## 2023-07-23 DIAGNOSIS — G4700 Insomnia, unspecified: Secondary | ICD-10-CM

## 2023-07-23 LAB — GLUCOSE, CAPILLARY
Glucose-Capillary: 98 mg/dL (ref 70–99)
Glucose-Capillary: 148 mg/dL — ABNORMAL HIGH (ref 70–99)
Glucose-Capillary: 123 mg/dL — ABNORMAL HIGH (ref 70–99)
Glucose-Capillary: 180 mg/dL — ABNORMAL HIGH (ref 70–99)

## 2023-07-23 MED ORDER — SODIUM CHLORIDE 0.9 % IV SOLN
2.0000 g | INTRAVENOUS | Status: AC
  Administered 2023-07-24: 2 g via INTRAVENOUS
  Filled 2023-07-23: qty 20

## 2023-07-23 MED ORDER — TRAZODONE HCL 50 MG PO TABS
75.0000 mg | ORAL_TABLET | Freq: Every day | ORAL | Status: DC
  Administered 2023-07-23 – 2023-08-20 (×28): 75 mg via ORAL
  Filled 2023-07-23 (×18): qty 2
  Filled 2023-07-23: qty 1.5
  Filled 2023-07-23 (×11): qty 2

## 2023-07-23 NOTE — Progress Notes (Signed)
 Occupational Therapy Session Note  Patient Details  Name: Calvin Schultz MRN: [id number] Date of Birth: Mar 26, 1951  Today's Date: 07/22/2023 OT Individual Time:  - 830-915  Late entry       Short Term Goals: Week 3:     Skilled Therapeutic Interventions/Progress Updates:    1:1 Pt received in the bed already dressed. Pt reported having an accident in his brief with bowels. Focus on rolling side to side with mod A with decr ability to bend knees up to use feet to help propel self into sidelying. Pt able to come to EOB with min A with extra time to scoot to EOB. Assisted pt with donning bilateral knee sleeves and shoes with setup. Pt choose to perform slide board transfer to w/c; to the right. Instructions for maintaining forward weight shift during a lateral scoot instead of rapid forward/ backward movements. Pt required A to place board and min-mod A (+2 present for safety) into the w/c.   Practiced sit to stands standing at the end of the bed at the foot board. With the bed slightly elevated - like a parallel bar pt performed sit to stand with max A and able to maintain  standing position for 10 seconds. Transitioned to next sit to stand with pt pushing up from arm rest on the right and was able to come into standing with mod A and then stand for 10 seconds. Pt was able to perform sit to stand one more time in this same manner with even lighter mod A!!. Pt left sitting up in w/c in prep for next therapist - coming right in. Wife present for session.   Therapy Documentation Precautions:  Precautions Precautions: Fall Precaution/Restrictions Comments: L nephrostomy tube Restrictions Weight Bearing Restrictions Per Provider Order: No  Pain:  No reports of new pain - on going pain in bilateral knees. Pt received meds and gel applied to knees   Therapy/Group: Individual Therapy  Roney Mans [facility] 07/23/2023, 5:55 PM

## 2023-07-23 NOTE — Progress Notes (Signed)
 Physical Therapy Session Note  Patient Details  Name: Calvin Schultz MRN: [id number] Date of Birth: 1950/11/08  Today's Date: 07/23/2023 PT Individual Time: 7829-5621, 0800-0912 PT Individual Time Calculation (min): 77 min, 72 min   Short Term Goals: Week 2:  PT Short Term Goal 1 (Week 2): pt will initiate gait training PT Short Term Goal 2 (Week 2): pt will complete sit to stand with LRAD and mod A or less PT Short Term Goal 3 (Week 2): Pt will complete bed to chair transfer with mod A or less consistently PT Short Term Goal 4 (Week 2): pt will perform custom WC eval  Skilled Therapeutic Interventions/Progress Updates:      Treatment Session 1   Pt supine in bed upon arrival. Pt agreeable to therapy. Pt reports 10/10 B Knee pain requesiting pain medicine, notified nursing, nurse present at start of session to administer pain medication.   Pt incontinent of bowel. Pt performed rolling B with heavy min A to R and light min A to L with use of bed rails. Supine to sit with use of bed features and bed rails with min-mod A 2/2  L lateral trunk lean today.   Slide board transfer to R with light min A, pt able to use power WC controls to tilt WC posteriorly for improved posterior scoot in chair.   Pt positioned power WC in front of sink to wash hands, brush hair, with supervision for management of control panel.   Pt navigated power WC in north tower for new scenery, pt practiced navigating WC at various speeds-and adjusting speed for crowded spaces versus open spaces.   Noted issues with joystick-glitching 2/2 faulty equipment, requiring manual override.   Pt seated in power WC at end of session with all needs within reach and bed alarm on. Plan to get new power WC next session.   Treatment Session 2  Pt supine in bed upon arrival. Pt agreeable to therapy. Pt denies any pain.   Pt incontinent of bowel. Pt performed rolling B with light min A, pt performed pericare and donning/doffing  pants with total A for time/energy conservation.   Supine to sit with supervision with use of bed features (HOB raised to 60 deg) and use of bedrails.   Slide board transfer bed to [facility] to R with CGA to stabilize slide board, pt performed posterior scoot into seat with reciprocal technique and supervsion and utilized setting of power WC for posterior scoot remainder distance.   Navigated power WC room to main gym with supervision, verbal cues provided for safety as pt demos significant deviation to L unintentionally when looking to Left at rehab apartment.   Pt performed slide board transfer to L WC to mat table downhill with CGA to stabilize slide board and increased time.   Pt performed lateral scooting along length of mat x3 bilaterally, verbal and tactile cues provided for technique, and anterior weight shift for buttock clearance.   Pt performed squat pivot transfer power WC to mat table x2 each direction with +2 max A.   Pt performed sit to stand in x2  // bars from power WC and mod-max A for power up, verbal/tactile cues provided for hip/knee extension and UE positioning for upright posture. Pt required gloves on B UE to assist with pt grip on // bars.   Pt performed standing lateral weight shifting 3x10 B, without seated rest break verbal/tactile cues provided for technique.   Pt seated in [facility] with all needs within  reach at end of session.       Therapy Documentation Precautions:  Precautions Precautions: Fall Precaution/Restrictions Comments: L nephrostomy tube Restrictions Weight Bearing Restrictions Per Provider Order: No   Therapy/Group: Individual Therapy  [facility], [place], DPT  07/23/2023, 7:44 AM

## 2023-07-23 NOTE — Progress Notes (Signed)
 Reported by Physical Therapy during his section Pt tried to reposition his mechanical arm rest by using his elbow which resulted in a new Right Elbow skin tear. Wound was assessed, cleansed, and dressed. New wound placed in LDA.

## 2023-07-23 NOTE — Progress Notes (Signed)
 Occupational Therapy Session Note  Patient Details  Name: Bartlett Enke MRN: [id number] Date of Birth: 1950-12-01  {CHL IP REHAB OT TIME CALCULATIONS:[id number]}   Short Term Goals: Week 2:  OT Short Term Goal 1 (Week 2): Pt will complete LB dressing while seated on EOB with Mod A while utilizing AE as needed. OT Short Term Goal 2 (Week 2): Pt will consistently demonstrate lateral scoot transfer at Min A with use of SB when transferring from surface to surface.  Skilled Therapeutic Interventions/Progress Updates:    Patient agreeable to participate in OT session. Reports *** pain level.   Patient participated in skilled OT session focusing on ***. Therapist facilitated/assessed/developed/educated/integrated/elicited *** in order to improve/facilitate/promote    Therapy Documentation Precautions:  Precautions Precautions: Fall Precaution/Restrictions Comments: L nephrostomy tube Restrictions Weight Bearing Restrictions Per Provider Order: No  Therapy/Group: Individual Therapy  Limmie Patricia, OTR/L,CBIS  Supplemental OT - [facility] and [facility] Secure Chat Preferred   07/23/2023, 4:43 PM

## 2023-07-23 NOTE — Progress Notes (Signed)
 Patient ID: Calvin Schultz, male   DOB: [birth year], 73 y.o.   MRN: [id number]  Met with pt and wife to give them the team conference update regarding progress this week in therapies. He continues to make slow and steady progress he reports he took 18 steps yesterday with the lite gait. Aware team has extended his stay to 4/4. The main issue is that wife can provide the level of care he will need at discharge. She is here daily and has seen him in therapies. Aware of surgery tomorrow wife will go over to [facility] Stay to get the MD report and see pt when is in recovery.

## 2023-07-23 NOTE — Progress Notes (Signed)
 PROGRESS NOTE   Subjective/Complaints: Working with therapy this morning.  Reports he continues to have knee pain but it is overall under control.  Reports poor sleep last night.  Patient reports his mood is okay, denies depression.   ROS: Patient denies abd pain , no breathing problems, no N/V, fever, chills + diarrhea-continues to be improved + chronic knee pain-unchanged  Objective:   No results found.  Recent Labs    07/21/23 0501 07/22/23 0518  WBC 13.6* 13.4*  HGB 8.3* 8.3*  HCT 28.1* 27.8*  PLT 375 394   Recent Labs    07/22/23 0518  NA 138  K 3.6  CL 111  CO2 19*  GLUCOSE 91  BUN 34*  CREATININE 3.24*  CALCIUM 8.4*    Intake/Output Summary (Last 24 hours) at 07/23/2023 1240 Last data filed at 07/23/2023 1308 Gross per 24 hour  Intake 540 ml  Output 1075 ml  Net -535 ml     Pressure Injury 07/10/23 Buttocks Right;Mid;Upper Stage 2 -  Partial thickness loss of dermis presenting as a shallow open injury with a red, pink wound bed without slough. Pink opened stage 2 right upper buttock (Active)  07/10/23 1608  Location: Buttocks  Location Orientation: Right;Mid;Upper  Staging: Stage 2 -  Partial thickness loss of dermis presenting as a shallow open injury with a red, pink wound bed without slough.  Wound Description (Comments): Pink opened stage 2 right upper buttock  Present on Admission: Yes     Pressure Injury 07/10/23 Buttocks Right;Mid;Upper Stage 2 -  Partial thickness loss of dermis presenting as a shallow open injury with a red, pink wound bed without slough. Pink stage 2 directly beside the right upper stage 2 (Active)  07/10/23 1609  Location: Buttocks  Location Orientation: Right;Mid;Upper  Staging: Stage 2 -  Partial thickness loss of dermis presenting as a shallow open injury with a red, pink wound bed without slough.  Wound Description (Comments): Pink stage 2 directly beside the right  upper stage 2  Present on Admission: Yes     Pressure Injury 07/10/23 Buttocks Right;Mid;Lower Stage 2 -  Partial thickness loss of dermis presenting as a shallow open injury with a red, pink wound bed without slough. Pink stage 2 to right buttock below the upper stage 2 (Active)  07/10/23 1610  Location: Buttocks  Location Orientation: Right;Mid;Lower  Staging: Stage 2 -  Partial thickness loss of dermis presenting as a shallow open injury with a red, pink wound bed without slough.  Wound Description (Comments): Pink stage 2 to right buttock below the upper stage 2  Present on Admission: Yes     Pressure Injury 07/10/23 Buttocks Left;Mid;Lower Stage 2 -  Partial thickness loss of dermis presenting as a shallow open injury with a red, pink wound bed without slough. Pink stage 2 to left buttock (Active)  07/10/23 1611  Location: Buttocks  Location Orientation: Left;Mid;Lower  Staging: Stage 2 -  Partial thickness loss of dermis presenting as a shallow open injury with a red, pink wound bed without slough.  Wound Description (Comments): Pink stage 2 to left buttock  Present on Admission: Yes    Physical Exam: Vital  Signs Blood pressure (!) 143/87, pulse 65, temperature 97.6 F (36.4 C), resp. rate 17, height 5\' 9"  (1.753 m), weight 99.8 kg, SpO2 100%.  General: No acute distress Mood and affect are appropriate Heart: Regular rate and rhythm no rubs murmurs or extra sounds Lungs: Clear to auscultation, breathing unlabored, no rales or wheezes Abdomen: Positive bowel sounds, soft nontender to palpation, nondistended Nephrostomy with yellow urine output, not blood-tinged this morning Extremities: No clubbing, cyanosis, or edema Skin: No evidence of breakdown, no evidence of rash Neurologic: Cranial nerves II through XII intact, motor strength is 4/5 in rght 3- left  deltoid, bicep, tricep, grip,3- RIght 4- left  hip flexor, knee extensors, 4/4 B ankle dorsiflexor and plantar  flexor   -Buttocks MASD--not visualized this am     MSK:    Both knees with effusions, tight. Left shoulder tight in ER/IR/F/E  Pain more with IR/ER than any movement. Was able to abduct to 90+ degrees       Sitting in wheelchair working with therapy      Assessment/Plan: 1. Functional deficits which require 3+ hours per day of interdisciplinary therapy in a comprehensive inpatient rehab setting. Physiatrist is providing close team supervision and 24 hour management of active medical problems listed below. Physiatrist and rehab team continue to assess barriers to discharge/monitor patient progress toward functional and medical goals  Care Tool:  Bathing  Bathing activity did not occur:  (patient completed a simulated task at bed LOF) Body parts bathed by patient: Right arm, Left arm, Chest, Abdomen, Front perineal area, Right upper leg, Left upper leg, Face   Body parts bathed by helper: Buttocks, Left lower leg, Right lower leg     Bathing assist Assist Level: Maximal Assistance - Patient 24 - 49%     Upper Body Dressing/Undressing Upper body dressing   What is the patient wearing?: Pull over shirt    Upper body assist Assist Level: Minimal Assistance - Patient > 75%    Lower Body Dressing/Undressing Lower body dressing      What is the patient wearing?: Pants, Underwear/pull up, Incontinence brief     Lower body assist Assist for lower body dressing: Maximal Assistance - Patient 25 - 49%     Toileting Toileting    Toileting assist Assist for toileting: 2 Helpers     Transfers Chair/bed transfer  Transfers assist  Chair/bed transfer activity did not occur: Safety/medical concerns (not safe to get up)  Chair/bed transfer assist level: Moderate Assistance - Patient 50 - 74%     Locomotion Ambulation   Ambulation assist   Ambulation activity did not occur: Safety/medical concerns          Walk 10 feet activity   Assist  Walk 10 feet activity  did not occur: Safety/medical concerns        Walk 50 feet activity   Assist Walk 50 feet with 2 turns activity did not occur: Safety/medical concerns         Walk 150 feet activity   Assist Walk 150 feet activity did not occur: Safety/medical concerns         Walk 10 feet on uneven surface  activity   Assist Walk 10 feet on uneven surfaces activity did not occur: Safety/medical concerns         Wheelchair     Assist Is the patient using a wheelchair?: Yes Type of Wheelchair: Manual    Wheelchair assist level: Dependent - Patient 0% Max wheelchair distance: 150'  Wheelchair 50 feet with 2 turns activity    Assist        Assist Level: Dependent - Patient 0%   Wheelchair 150 feet activity     Assist      Assist Level: Dependent - Patient 0%   Blood pressure (!) 143/87, pulse 65, temperature 97.6 F (36.4 C), resp. rate 17, height 5\' 9"  (1.753 m), weight 99.8 kg, SpO2 100%.   Medical Problem List and Plan: 1. Functional deficits secondary to debility related to septic shock/C. difficile colitis/AKI superimposed on CKD stage IV             -patient may shower with tubes/drains covered             -ELOS/Goals:  3/28, min assist PT/OT             -Continue CIR therapies including PT, OT     -Expected discharge 4/4   2.  Antithrombotics: -DVT/anticoagulation:  Pharmaceutical: Eliquis             -antiplatelet therapy: N/A   3. Pain Management: tramadol prn, Zanaflex 4 mg twice daily as needed   Biliateral OA of knees  -ordered bilateral neoprine knee sleeves --try today  -voltaren gel tid to knees  -have scheduled tramadol 50mg  at 0700 and 1200 daily. Continue prn as well  Mild adhesive capsulitis left shoulder---added sports cream. Can use voltaren gel also. Aggressive ROM with therapy and while in bed 4. Mood/Behavior/Sleep: Trazodone 50 mg nightly as needed.  Provide emotional support             -antipsychotic agents: N/A    5. Neuropsych/cognition: This patient is capable of making decisions on his own behalf.   6. Skin/Wound Care: Routine skin checks   7. Fluids/Electrolytes/Nutrition:    -3/14 po intake remains poor, albumin low, weights falling  -will ask RD for assistance. Have spoken to patient about intake  -?IV albumin (see below)  3/18 discussed with dietitian, patient recorded is eating 0% of his meals.  Appears that wife bringing in 3 meals a day and he is eating about 75%, appears to have fairly adequate intake. 8.  Oliguric AKI superimposed on CKD stage IV.  Baseline creatinine 4.9.  CRRT discontinued 2/25.  No current plan for long-term dialysis.  Follow-up renal services              - Per discussion with nephrology Dr. Verna Czech, keep Foley catheter in to allow accurate I's and O's through Monday; then, can remove and do DC Foley trial while continuing to document strict outputs             - Continue daily assessments of renal function per nephrology   3/8- per renal, Cr leveling out- and con't Foley- will remove Monday  -nephrology following, Cr remains in 4's.   3/14 renal function with some improvement today     -suspect lower extremity edema is related to renal function/low albumin   Appreciate nephro input   -3/17 BUN/creatinine slightly improved 34/3.31, appreciate nephrology assistance  3/19 BUN/CR stable at 34/3.24, nephrology signing off, ok to check BMET twice a week  -Schedule labs Monday and Thursday 9.  C. difficile colitis.  Completed course of oral vancomycin 3/7.  DC'd IV Flagyl 3/3. Enteric precautions   3/14 stools formed this morning   3/17 discussed with ID pharmacy, patient having more frequent bowel movements.  Monitor today and if continues consider Dificid treatment  3/18 diarrhea appears to be improving,  continue to monitor for now  3/19 Frequent Bms improved, continue to follow 10.  Status post left nephrostomy tube d/t staghorn calculi. Has Foley Nephrostomy tube  placed 1/29 per IR with plan for ureteric stent placement 3/21              - Careful monitoring of nephrostomy output  -3/14 flomax has been added to improve emptying   -spoke to Dr. Mena Goes today who will perform endoscopy on 3/21 to initially break up calculi/ remove nephrostomy tube. No stent apparently    -he requested prophylactic keflex 250mg  bid beginning 3/17    Still on enteric prec  -Contact IR today regarding blood in nephrostomy-understandable given calculi that can cause irritation, IR PA to evaluate to make sure it is functioning correctly, appreciate assistance  -3/18 nephrostomy tube with good output, only blood-tinged urine today.  Appears to be improving continue to monitor.  Urine culture pending  -3/19 blood tinged urine in tube today, continue current  3/20 urine more clear today, urology procedure tomorrow 11.  Chronic atrial fibrillation with episodes of bradycardia.  Followed by cardiology services.  Continue Eliquis.  Avoid AV nodal blocking agents   12.  Diabetes mellitus.  Hemoglobin A1c 6.2.    -tightly controlled. Dc SSI and change cbg checks to qam only   CBG (last 3)  Recent Labs    07/22/23 1133 07/22/23 1628 07/23/23 0623  GLUCAP 157* 157* 98    Controlled diet 3/16    3/20 CBGs controlled, continue current regimen 13.  Hypotension.  ProAmatine 10 mg 3 times daily.  Monitor with increased mobility   - Normotensive on admission  3/9- BP running 100-s to 120s systolic- con't regimen  3/19 BP has been on high side, will change midodrine to 5mg  TID PRN orthostatic hypotension     07/23/2023    2:51 AM 07/22/2023    8:03 PM 07/22/2023    3:00 PM  Vitals with BMI  Systolic 143 151 213  Diastolic 87 92 85  Pulse 65 100 68    14.  Chronic anemia.  Niferex daily/Aranesp.  Monitor for any bleeding episodes   3/8- Hb 7.1 today- will order transfusion- transfusion of 1 unit pRBCs.   3/14 hgb up to 9.1  -3/17 hemoglobin down to 8.4, continue to  monitor  -3/19 HGB 8.3 today, stable overall, continue to monitor     16. Leukocytosis  3/9- Pt's WBC is 21.7- up from 16k- is afebrile- due to his recent C diff, that just finished correct dosing of ABX for, called ID for guidance on treatment- don't want to cause more Cdiff- ID consulted ---no new orders  3/10 WBC's down to 17k  --have been hovering above and below 20 for awhile. No new clinical symptoms. Nephrostomy drainage clear, yellow  3/14 wbc's down to 13k.--recheck Monday   3/17 WBC is down to 12.4  3/19 WBC 13.4 today, continue to monitor  -will schedule labs Monday and Thursday 17. Decreased mood reported by therapy  -Consider SSRI  -3/20 patient reports his mood is okay, denies depression this morning.  Continue to monitor 18. Insomnia   -3/20 schedule trazodone and increase to 75mg  QHS   LOS: 13 days A FACE TO FACE EVALUATION WAS PERFORMED  Fanny Dance 07/23/2023, 12:40 PM

## 2023-07-23 NOTE — Progress Notes (Signed)
 Occupational Therapy Session Note  Patient Details  Name: Calvin Schultz MRN: [id number] Date of Birth: [birth year]  Today's Date: 07/23/2023 OT Individual Time: 7829-5621 OT Individual Time Calculation (min): 43 min    Short Term Goals: Week 2:  OT Short Term Goal 1 (Week 2): Pt will complete LB dressing while seated on EOB with Mod A while utilizing AE as needed. OT Short Term Goal 2 (Week 2): Pt will consistently demonstrate lateral scoot transfer at Min A with use of SB when transferring from surface to surface.  Skilled Therapeutic Interventions/Progress Updates: Patient received sitting up in his power w/c. Agreeable to OT treatment with continued report of joint pain. Patient seen for functional transfer training working on slide board transfer to and from power chair and bed. Patient able to maneuver w/c into good position next to bed. Assist to place SB, but did well with positioning leg rests and arm rest in preparation for transfer. Patient able to transfer with Min assist physically to and from bed. Continued treatment with AAROM of LUE working on improving pain free ROM with scapular depression stretch and GH mobilization prior to AAROM. Patient naturally moved into scapular elevation with assisted movement. Only tolerating shoulder flexion to 60 degrees before joint pain increased. Patient with good participation throughout treatment. Very motivated to regain function. Continue with skilled OT POC to improve independence with self care and functional transfers.      Therapy Documentation Precautions:  Precautions Precautions: Fall Precaution/Restrictions Comments: L nephrostomy tube Restrictions Weight Bearing Restrictions Per Provider Order: No    Pain: Pain Assessment Pain Scale: 0-10 Pain Score: 10-Worst pain ever Pain Location: Knee Pain Intervention(s): Medication (See eMAR)    Therapy/Group: Individual Therapy  Warnell Forester 07/23/2023, 10:28 AM

## 2023-07-23 NOTE — Plan of Care (Signed)
 Reviewed case and plan with Mr. Calvin Schultz and his wife.  All questions were answered to their satisfaction and consent was obtained for cystoscopy with laser lithotripsy, retrograde pyelogram, and likely ureteral stent placement.  N.p.o. at midnight

## 2023-07-24 ENCOUNTER — Encounter (HOSPITAL_COMMUNITY)
Admission: AD | Disposition: A | Discharge status: Home Health | Payer: Self-pay | Source: Intra-hospital | Attending: Physical Medicine & Rehabilitation

## 2023-07-24 ENCOUNTER — Inpatient Hospital Stay (HOSPITAL_COMMUNITY): Admitting: Anesthesiology

## 2023-07-24 ENCOUNTER — Inpatient Hospital Stay (HOSPITAL_COMMUNITY)

## 2023-07-24 DIAGNOSIS — E119 Type 2 diabetes mellitus without complications: Secondary | ICD-10-CM

## 2023-07-24 DIAGNOSIS — N200 Calculus of kidney: Secondary | ICD-10-CM

## 2023-07-24 DIAGNOSIS — I10 Essential (primary) hypertension: Secondary | ICD-10-CM

## 2023-07-24 HISTORY — PX: CYSTOSCOPY/URETEROSCOPY/HOLMIUM LASER/STENT PLACEMENT: SHX6546

## 2023-07-24 LAB — GLUCOSE, CAPILLARY
Glucose-Capillary: 112 mg/dL — ABNORMAL HIGH (ref 70–99)
Glucose-Capillary: 115 mg/dL — ABNORMAL HIGH (ref 70–99)

## 2023-07-24 SURGERY — CYSTOSCOPY/URETEROSCOPY/HOLMIUM LASER/STENT PLACEMENT
Anesthesia: General | Laterality: Left

## 2023-07-24 MED ORDER — ONDANSETRON HCL 4 MG/2ML IJ SOLN: 4.0000 mg | Freq: Four times a day (QID) | INTRAMUSCULAR | Status: DC | PRN

## 2023-07-24 MED ORDER — IOHEXOL 300 MG/ML  SOLN
INTRAMUSCULAR | Status: DC | PRN
  Administered 2023-07-24: 30 mL

## 2023-07-24 MED ORDER — FENTANYL CITRATE PF 50 MCG/ML IJ SOSY: 25.0000 ug | PREFILLED_SYRINGE | INTRAMUSCULAR | Status: DC | PRN

## 2023-07-24 MED ORDER — LACTATED RINGERS IV SOLN
INTRAVENOUS | Status: DC
Start: 2023-07-24 — End: 2023-07-24

## 2023-07-24 MED ORDER — LIDOCAINE HCL (PF) 2 % IJ SOLN
INTRAMUSCULAR | Status: DC | PRN
  Administered 2023-07-24: 100 mg via INTRADERMAL

## 2023-07-24 MED ORDER — ORAL CARE MOUTH RINSE: 15.0000 mL | Freq: Once | OROMUCOSAL | Status: AC

## 2023-07-24 MED ORDER — SODIUM CHLORIDE 0.9 % IR SOLN
Status: DC | PRN
  Administered 2023-07-24: 6000 mL

## 2023-07-24 MED ORDER — PHENYLEPHRINE 80 MCG/ML (10ML) SYRINGE FOR IV PUSH (FOR BLOOD PRESSURE SUPPORT)
PREFILLED_SYRINGE | INTRAVENOUS | Status: DC | PRN
  Administered 2023-07-24: 240 ug via INTRAVENOUS
  Administered 2023-07-24 (×2): 160 ug via INTRAVENOUS
  Administered 2023-07-24: 240 ug via INTRAVENOUS

## 2023-07-24 MED ORDER — CHLORHEXIDINE GLUCONATE CLOTH 2 % EX PADS
6.0000 | MEDICATED_PAD | Freq: Every day | CUTANEOUS | Status: DC
  Administered 2023-07-24: 6 via TOPICAL

## 2023-07-24 MED ORDER — PROPOFOL 10 MG/ML IV BOLUS
INTRAVENOUS | Status: DC | PRN
  Administered 2023-07-24: 150 mg via INTRAVENOUS

## 2023-07-24 MED ORDER — FENTANYL CITRATE (PF) 100 MCG/2ML IJ SOLN
INTRAMUSCULAR | Status: DC | PRN
  Administered 2023-07-24: 100 ug via INTRAVENOUS

## 2023-07-24 MED ORDER — CHLORHEXIDINE GLUCONATE 0.12 % MT SOLN
15.0000 mL | Freq: Once | OROMUCOSAL | Status: AC
  Administered 2023-07-24: 15 mL via OROMUCOSAL

## 2023-07-24 MED ORDER — DEXAMETHASONE SODIUM PHOSPHATE 10 MG/ML IJ SOLN
INTRAMUSCULAR | Status: DC | PRN
  Administered 2023-07-24: 5 mg via INTRAVENOUS

## 2023-07-24 MED ORDER — ONDANSETRON HCL 4 MG/2ML IJ SOLN
INTRAMUSCULAR | Status: DC | PRN
  Administered 2023-07-24: 4 mg via INTRAVENOUS

## 2023-07-24 MED ORDER — OXYCODONE HCL 5 MG/5ML PO SOLN: 5.0000 mg | Freq: Once | ORAL | Status: DC | PRN

## 2023-07-24 MED ORDER — INSULIN ASPART 100 UNIT/ML IJ SOLN: 0.0000 [IU] | INTRAMUSCULAR | Status: DC | PRN

## 2023-07-24 MED ORDER — OXYCODONE HCL 5 MG PO TABS: 5.0000 mg | ORAL_TABLET | Freq: Once | ORAL | Status: DC | PRN

## 2023-07-24 SURGICAL SUPPLY — 22 items
BAG URO CATCHER STRL LF (MISCELLANEOUS) ×1 IMPLANT
BASKET ZERO TIP NITINOL 2.4FR (BASKET) IMPLANT
CATH URETL OPEN END 6FR 70 (CATHETERS) ×1 IMPLANT
CATH UROLOGY TORQUE 65 (CATHETERS) IMPLANT
CLOTH BEACON ORANGE TIMEOUT ST (SAFETY) ×1 IMPLANT
EXTRACTOR STONE 1.7FRX115CM (UROLOGICAL SUPPLIES) IMPLANT
FIBER LASER MOSES 200 DFL (Laser) IMPLANT
FIBER LASER MOSES 365 DFL (Laser) IMPLANT
GLOVE BIO SURGEON STRL SZ7.5 (GLOVE) ×1 IMPLANT
GOWN STRL REUS W/ TWL XL LVL3 (GOWN DISPOSABLE) ×1 IMPLANT
GUIDEWIRE ANG ZIPWIRE 038X150 (WIRE) IMPLANT
GUIDEWIRE STR DUAL SENSOR (WIRE) ×1 IMPLANT
GUIDEWIRE ZIPWRE .038 STRAIGHT (WIRE) IMPLANT
KIT TURNOVER KIT A (KITS) IMPLANT
MANIFOLD NEPTUNE II (INSTRUMENTS) ×1 IMPLANT
PACK CYSTO (CUSTOM PROCEDURE TRAY) ×1 IMPLANT
SHEATH NAVIGATOR HD 11/13X28 (SHEATH) IMPLANT
SHEATH NAVIGATOR HD 11/13X36 (SHEATH) IMPLANT
STENT URET 6FRX28 CONTOUR (STENTS) IMPLANT
TRAY FOLEY MTR SLVR 16FR STAT (SET/KITS/TRAYS/PACK) IMPLANT
TUBING CONNECTING 10 (TUBING) ×1 IMPLANT
TUBING UROLOGY SET (TUBING) ×1 IMPLANT

## 2023-07-24 NOTE — Progress Notes (Signed)
 Occupational Therapy Note  Patient Details  Name: Calvin Schultz MRN: [id number] Date of Birth: [birth year]  Today's Date: 07/24/2023 OT Missed Time: 60 Minutes Missed Time Reason: Unavailable (comment);Other (comment) (pt to be transported to [facility] for surgery)  Pt missed 60 mins of group session d/t pt waiting to be transported to [facility] for surgery.    [name] 07/24/2023, 12:08 PM

## 2023-07-24 NOTE — Op Note (Signed)
 Preoperative diagnosis: Left UPJ stone, left lower pole stones Postoperative diagnosis: Same  Surgeon: Mena Goes  Anesthesia: General  Procedure:  1)Cystoscopy with left retrograde pyelogram, left ureteroscopy laser lithotripsy and left ureteral stent placement 2) antegrade nephrostogram and nephrostomy tube removal  Surgeon: Mena Goes  Anesthesia: General  Indication for procedure: Calvin Schultz is a 73 year old male who underwent an urgent left nephrostomy tube for a left obstructing stone, acute on chronic renal failure and UTI.  He presents today for definitive stone management.  Is also had trouble voiding.  He is in inpatient rehab and required in and out cath.  Findings: On exam the penis is uncircumcised but no mass or lesion of the foreskin.  The glans and the meatus appear normal.  Scrotum appears normal and testicles palpably normal.  On DRE prostate is smooth and about 30 g without hard area or nodule.  On cystoscopy there is some bruising and trauma of the distal bulb/proximal penile urethra from what looks like the tip of an In-N-Out catheters.  There is no actual false passage but there is some breach of the mucosa in a couple of places.  More proximally the prostate is short without obstruction.  Bladder appeared normal.  No stone or foreign body in the bladder.  No mucosal lesion.  Ureteral orifices were in their normal orthotopic position with clear E flux.  Left retrograde pyelogram-this outlined a single ureter single collecting system unit.  There was a very narrow track of contrast that found its way into the lower pole in the upper pole.  Repeat retrograde after attempted wire passage revealed there to be no contrast passage into the collecting system.  After gaining upper pole access repeat injection of contrast outlined the upper pole calyx and infundibulum but again narrow UPJ.  Left antegrade nephrostogram-this outlined the lower pole with contrast filling into the renal  pelvis and upper pole without extravasation.  Contrast advanced down the ureter normally.  The nephrostomy tube was removed.  Repeat image showed the stent to be in good position crossing the UPJ and coiling up in the upper calyx.  Description of procedure: After consent was obtained patient brought to the operating room.  After adequate anesthesia he is placed in lithotomy position and prepped and draped in the usual sterile fashion.  Timeout was performed to confirm the patient and procedure.  The cystoscope was passed per urethra and the bladder inspected.  The left ureteral orifice was then cannulated with a 6 [place] open-ended catheter and left retrograde injection of contrast was performed.  A sensor wire was then advanced but coiled at the UPJ or more accurate description at the junction of the upper and lower infundibula at the stone.  Therefore advanced a 6 [place] open-ended up toward the kidney to brace the wire and passed the sensor again to no avail.  Contrast was injected but not seen to now really be advancing into the collecting system.  More of a blind ending channel.  I then passed an angled zip wire but it also coiled and came back down the ureter.  Therefore I removed the open-ended catheter and passed a torque catheter but again could not steer the wire superiorly laterally or inferiorly.  The wire coiled and came back down the ureter.  Therefore I left 1 wire coiled in the renal pelvis and backed out the torque catheter.  I then used a medium access sheath to get 2 wires in place passed a digital ureteroscope where it appeared the  UPJ had closed off.  Under direct vision where I could see the possibly a fold or turn in the mucosa I passed the angled zip wire and it coiled possibly in a midpole calyx.  Therefore I backed the scope out passed the open-ended catheter over the wire into the space and remove the wire.  There was no urine drip and injection of contrast seem to be more extravasation.   Therefore this access was abandoned.  I repassed the digital scope and thought I could just see a glimmer of stone behind the mucosa.  Therefore I directed the zip wire in this location and it appeared to go up and then coiled in the upper pole calyx.  I repassed the open-ended catheter over the wire into the upper calyx and remove the wire.  Now I got a hydronephrotic drip and injection of contrast confirmed the collecting system access.  Therefore I repassed the wire and coiled that in the upper pole.  I repositioned the access sheath beside this wire leaving it as the safety wire.  I will add the access sheath went without difficulty at all times.  I repassed the dual channel digital and in the location of what looked like the stone I passed a 200 m laser fiber and fired the laser 0.2 and 50 and also 1 and 15 this open the mucosa and the stone became visible.  I carefully worked this channel and was able to advance right on top of the stone where it became visible.  I then was able to advance the scope into the space once I had more room.  The stone was hard and I was able to dust about half of the stone.  He also had stones in the lower pole and therefore I thought at this point it was a good time to place a stent for a staged procedure.  The access sheath was backed out on the ureteroscope and the collecting system renal pelvis UPJ ureter inspected on the way out noted to have no injury.  Swapped out for the cystoscope and backloaded the wire on the cystoscope and passed a 6 x 28 cm stent.  The wire was removed with a good coil seen in the upper calyx and a good coil coming down through the renal pelvis and coil in the bladder.  I then moved to the patient's left flank and remove the drainage tubing.  I we will add I left drainage tubing so that he had continuous drainage during the ureteroscopic phase.  A syringe of contrast was attached to the nephrostomy tube and antegrade and injection of contrast was  performed.  I then cut the suture at the skin and securing the tube and removed it.  I then popped off the class and let the string loose and holding the nephrostomy tube coil in place.  The nephrostomy tube now slid out without difficulty.  Another image showed the stent to be in good position and had not changed position.  I then did an exam under anesthesia, changed gloves and placed a 16 French Foley catheter to max drain the system over the weekend and let the urethra and nephrostomy tube site heal.  He was then awakened and taken to recovery room in stable condition.  Complications: None  Blood loss: Minimal  Specimens: None  Drains: #1 6 x 28 cm left ureteral stent #2 16 French Foley catheter  Disposition: Patient stable to PACU.  I placed an order to remove  the Foley on Monday morning around 8 AM.  Foley catheter placed to max drain the system and allow the nephrostomy tube site and the urethra to heal.  I discussed the procedure with Nettie Elm along with the postop care and follow-up.  He will need a staged procedure and we will schedule him for a left ureteroscopy in 2-3 weeks.

## 2023-07-24 NOTE — Progress Notes (Signed)
 Patient ID: Calvin Schultz, male   DOB: 01-23-1951, 73 y.o.   MRN: [id number]  Message sent to let staff know pt set to leave via [facility] at 11:15 to go to [facility] stay for his surgery and then will be bought back once completed. Wife to meet his over there and hear the results of the surgery.

## 2023-07-24 NOTE — Progress Notes (Signed)
*   Day of Surgery * Subjective: Patient reports no complaints.  He is getting stronger.  He said he is taking a few steps now.  Left nephrostomy tube draining well.  Urine clear.  He has had some trouble voiding and needed In-N-Out cath.  No dysuria or gross hematuria.  Objective: Vital signs in last 24 hours: Temp:  [98.2 F (36.8 C)-98.3 F (36.8 C)] 98.3 F (36.8 C) (03/21 1258) Pulse Rate:  [69-74] 74 (03/21 1258) Resp:  [18] 18 (03/21 1258) BP: (128-143)/(73-83) 128/83 (03/21 1258) SpO2:  [99 %-100 %] 99 % (03/21 1258) Weight:  [99.7 kg] 99.7 kg (03/21 0458)  Intake/Output from previous day: 03/20 0701 - 03/21 0700 In: 180 [P.O.:180] Out: 1340 [Urine:1340] Intake/Output this shift: Total I/O In: 0  Out: 100 [Urine:100]  Physical Exam:  Looks well in the preop area. Left nephrostomy tube in place with clear urine   Lab Results: Recent Labs    07/22/23 0518  HGB 8.3*  HCT 27.8*   BMET Recent Labs    07/22/23 0518  NA 138  K 3.6  CL 111  CO2 19*  GLUCOSE 91  BUN 34*  CREATININE 3.24*  CALCIUM 8.4*   No results for input(s): "LABPT", "INR" in the last 72 hours. No results for input(s): "LABURIN" in the last 72 hours. Results for orders placed or performed during the hospital encounter of 07/10/23  Urine Culture (for pregnant, neutropenic or urologic patients or patients with an indwelling urinary catheter)     Status: Abnormal (Preliminary result)   Collection Time: 07/20/23  3:23 PM   Specimen: Urine, Catheterized  Result Value Ref Range Status   Specimen Description URINE, CATHETERIZED  Final   Special Requests NONE  Final   Culture (A)  Final    >=100,000 COLONIES/mL PSEUDOMONAS FLUORESCENS Sent to [facility] for further susceptibility testing. Performed at [facility], [address], [place], [place] [address]    Report Status PENDING  Incomplete    Studies/Results: No results found.  Assessment/Plan: Chronic kidney disease with  significant left stone burden status post left nephrostomy-discussed again with patient and wife the nature, potential benefits, risks and alternatives to cystoscopy with left retrograde pyelogram, left ureteroscopy laser lithotripsy and left ureteral stent placement, including side effects of the proposed treatment, the likelihood of the patient achieving the goals of the procedure, and any potential problems that might occur during the procedure or recuperation. All questions answered.  Discussed goals of procedure to get stent in place and nephrostomy tube removed.  Also begin to work on the stones although significant stone burden may need a staged procedure.  He has had trouble voiding and we will perform cystoscopy and evaluate prostate as part of procedure.  His prostate was about 35 g on prior CT scan and he is on tamsulosin.  Patient elects to proceed.     LOS: 14 days   Jerilee Field 07/24/2023, 2:16 PM

## 2023-07-24 NOTE — Anesthesia Procedure Notes (Signed)
 Procedure Name: LMA Insertion Date/Time: 07/24/2023 2:37 PM  Performed by: Doran Clay, CRNAPre-anesthesia Checklist: Patient identified, Emergency Drugs available, Suction available and Patient being monitored Patient Re-evaluated:Patient Re-evaluated prior to induction Oxygen Delivery Method: Circle system utilized Preoxygenation: Pre-oxygenation with 100% oxygen Induction Type: IV induction LMA: LMA inserted LMA Size: 4.0 Tube type: Oral Number of attempts: 1 Placement Confirmation: positive ETCO2 and breath sounds checked- equal and bilateral Tube secured with: Tape Dental Injury: Teeth and Oropharynx as per pre-operative assessment

## 2023-07-24 NOTE — Anesthesia Preprocedure Evaluation (Signed)
 Anesthesia Evaluation  Patient identified by MRN, date of birth, ID band Patient awake    Reviewed: Allergy & Precautions, H&P , NPO status , Patient's Chart, lab work & pertinent test results  Airway Mallampati: II   Neck ROM: full    Dental   Pulmonary neg pulmonary ROS   breath sounds clear to auscultation       Cardiovascular hypertension,  Rhythm:regular Rate:Normal     Neuro/Psych    GI/Hepatic   Endo/Other  diabetes, Type 2    Renal/GU Renal InsufficiencyRenal diseasestones     Musculoskeletal  (+) Arthritis ,    Abdominal   Peds  Hematology  (+) Blood dyscrasia, anemia Hemoglobin 8.3   Anesthesia Other Findings   Reproductive/Obstetrics                             Anesthesia Physical Anesthesia Plan  ASA: 3  Anesthesia Plan: General   Post-op Pain Management:    Induction: Intravenous  PONV Risk Score and Plan: 2 and Ondansetron, Dexamethasone and Treatment may vary due to age or medical condition  Airway Management Planned: LMA  Additional Equipment:   Intra-op Plan:   Post-operative Plan: Extubation in OR  Informed Consent: I have reviewed the patients History and Physical, chart, labs and discussed the procedure including the risks, benefits and alternatives for the proposed anesthesia with the patient or authorized representative who has indicated his/her understanding and acceptance.     Dental advisory given  Plan Discussed with: CRNA, Anesthesiologist and Surgeon  Anesthesia Plan Comments:        Anesthesia Quick Evaluation

## 2023-07-24 NOTE — Progress Notes (Signed)
 Spoke with [facility], they are bringing patient over now

## 2023-07-24 NOTE — Progress Notes (Signed)
 PROGRESS NOTE   Subjective/Complaints: Patient had a few bowel movements last night, was able to fall asleep afterwards.  Patient has cystoscopy with laser lithotripsy, retrograde pyelogram and likely ureteral stent placement planned by urology today.   ROS: Patient denies abd pain , no breathing problems, no N/V, fever, chills + diarrhea-few bowel movements last night + chronic knee pain-well-controlled today  Objective:   No results found.  Recent Labs    07/22/23 0518  WBC 13.4*  HGB 8.3*  HCT 27.8*  PLT 394   Recent Labs    07/22/23 0518  NA 138  K 3.6  CL 111  CO2 19*  GLUCOSE 91  BUN 34*  CREATININE 3.24*  CALCIUM 8.4*    Intake/Output Summary (Last 24 hours) at 07/24/2023 0932 Last data filed at 07/24/2023 0802 Gross per 24 hour  Intake 0 ml  Output 1140 ml  Net -1140 ml     Pressure Injury 07/10/23 Buttocks Right;Mid;Upper Stage 2 -  Partial thickness loss of dermis presenting as a shallow open injury with a red, pink wound bed without slough. Pink opened stage 2 right upper buttock (Active)  07/10/23 1608  Location: Buttocks  Location Orientation: Right;Mid;Upper  Staging: Stage 2 -  Partial thickness loss of dermis presenting as a shallow open injury with a red, pink wound bed without slough.  Wound Description (Comments): Pink opened stage 2 right upper buttock  Present on Admission: Yes     Pressure Injury 07/10/23 Buttocks Right;Mid;Upper Stage 2 -  Partial thickness loss of dermis presenting as a shallow open injury with a red, pink wound bed without slough. Pink stage 2 directly beside the right upper stage 2 (Active)  07/10/23 1609  Location: Buttocks  Location Orientation: Right;Mid;Upper  Staging: Stage 2 -  Partial thickness loss of dermis presenting as a shallow open injury with a red, pink wound bed without slough.  Wound Description (Comments): Pink stage 2 directly beside the right  upper stage 2  Present on Admission: Yes     Pressure Injury 07/10/23 Buttocks Right;Mid;Lower Stage 2 -  Partial thickness loss of dermis presenting as a shallow open injury with a red, pink wound bed without slough. Pink stage 2 to right buttock below the upper stage 2 (Active)  07/10/23 1610  Location: Buttocks  Location Orientation: Right;Mid;Lower  Staging: Stage 2 -  Partial thickness loss of dermis presenting as a shallow open injury with a red, pink wound bed without slough.  Wound Description (Comments): Pink stage 2 to right buttock below the upper stage 2  Present on Admission: Yes     Pressure Injury 07/10/23 Buttocks Left;Mid;Lower Stage 2 -  Partial thickness loss of dermis presenting as a shallow open injury with a red, pink wound bed without slough. Pink stage 2 to left buttock (Active)  07/10/23 1611  Location: Buttocks  Location Orientation: Left;Mid;Lower  Staging: Stage 2 -  Partial thickness loss of dermis presenting as a shallow open injury with a red, pink wound bed without slough.  Wound Description (Comments): Pink stage 2 to left buttock  Present on Admission: Yes    Physical Exam: Vital Signs Blood pressure (!) 143/73, pulse  69, temperature 98.2 F (36.8 C), temperature source Oral, resp. rate 18, height 5\' 9"  (1.753 m), weight 99.7 kg, SpO2 100%.  General: No acute distress Mood and affect are appropriate Heart: Regular rate and rhythm no rubs murmurs or extra sounds Lungs: Clear to auscultation, breathing unlabored, no rales or wheezes Abdomen: Positive bowel sounds, soft nontender to palpation, nondistended Nephrostomy with dark yellow urine output Extremities: No clubbing, cyanosis, or edema Skin: No evidence of breakdown, no evidence of rash Neurologic: Cranial nerves II through XII grossly intact, motor strength is 4/5 in rght 3- left  deltoid, bicep, tricep, grip,3- RIght 4- left  hip flexor, knee extensors, 4/4 B ankle dorsiflexor and plantar  flexor   -Buttocks MASD--not visualized this am     MSK: Bilateral knee effusions Left shoulder tight in ER/IR/F/E            Assessment/Plan: 1. Functional deficits which require 3+ hours per day of interdisciplinary therapy in a comprehensive inpatient rehab setting. Physiatrist is providing close team supervision and 24 hour management of active medical problems listed below. Physiatrist and rehab team continue to assess barriers to discharge/monitor patient progress toward functional and medical goals  Care Tool:  Bathing  Bathing activity did not occur:  (patient completed a simulated task at bed LOF) Body parts bathed by patient: Right arm, Left arm, Chest, Abdomen, Front perineal area, Right upper leg, Left upper leg, Face   Body parts bathed by helper: Buttocks, Left lower leg, Right lower leg     Bathing assist Assist Level: Maximal Assistance - Patient 24 - 49%     Upper Body Dressing/Undressing Upper body dressing   What is the patient wearing?: Pull over shirt    Upper body assist Assist Level: Minimal Assistance - Patient > 75%    Lower Body Dressing/Undressing Lower body dressing      What is the patient wearing?: Pants, Underwear/pull up, Incontinence brief     Lower body assist Assist for lower body dressing: Maximal Assistance - Patient 25 - 49%     Toileting Toileting    Toileting assist Assist for toileting: 2 Helpers     Transfers Chair/bed transfer  Transfers assist  Chair/bed transfer activity did not occur: Safety/medical concerns (not safe to get up)  Chair/bed transfer assist level: Minimal Assistance - Patient > 75% (min A slide board, +2 max A squat pivot)     Locomotion Ambulation   Ambulation assist   Ambulation activity did not occur: Safety/medical concerns  Assist level: Dependent - Patient 0% Assistive device: Other (comment) (sara plus) Max distance: 18 steps   Walk 10 feet activity   Assist  Walk 10 feet  activity did not occur: Safety/medical concerns        Walk 50 feet activity   Assist Walk 50 feet with 2 turns activity did not occur: Safety/medical concerns         Walk 150 feet activity   Assist Walk 150 feet activity did not occur: Safety/medical concerns         Walk 10 feet on uneven surface  activity   Assist Walk 10 feet on uneven surfaces activity did not occur: Safety/medical concerns         Wheelchair     Assist Is the patient using a wheelchair?: Yes Type of Wheelchair: Power    Wheelchair assist level: Supervision/Verbal cueing Max wheelchair distance: 150'    Wheelchair 50 feet with 2 turns activity    Assist  Assist Level: Supervision/Verbal cueing   Wheelchair 150 feet activity     Assist      Assist Level: Supervision/Verbal cueing   Blood pressure (!) 143/73, pulse 69, temperature 98.2 F (36.8 C), temperature source Oral, resp. rate 18, height 5\' 9"  (1.753 m), weight 99.7 kg, SpO2 100%.   Medical Problem List and Plan: 1. Functional deficits secondary to debility related to septic shock/C. difficile colitis/AKI superimposed on CKD stage IV             -patient may shower with tubes/drains covered             -ELOS/Goals:  3/28, min assist PT/OT             -Continue CIR therapies including PT, OT     -Expected discharge 4/4   2.  Antithrombotics: -DVT/anticoagulation:  Pharmaceutical: Eliquis             -antiplatelet therapy: N/A   3. Pain Management: tramadol prn, Zanaflex 4 mg twice daily as needed   Biliateral OA of knees  -ordered bilateral neoprine knee sleeves --try today  -voltaren gel tid to knees  -have scheduled tramadol 50mg  at 0700 and 1200 daily. Continue prn as well  Mild adhesive capsulitis left shoulder---added sports cream. Can use voltaren gel also. Aggressive ROM with therapy and while in bed 4. Mood/Behavior/Sleep: Trazodone 50 mg nightly as needed.  Provide emotional support              -antipsychotic agents: N/A   5. Neuropsych/cognition: This patient is capable of making decisions on his own behalf.   6. Skin/Wound Care: Routine skin checks   7. Fluids/Electrolytes/Nutrition:    -3/14 po intake remains poor, albumin low, weights falling  -will ask RD for assistance. Have spoken to patient about intake  -?IV albumin (see below)  3/18 discussed with dietitian, patient recorded is eating 0% of his meals.  Appears that wife bringing in 3 meals a day and he is eating about 75%, appears to have fairly adequate intake. 8.  Oliguric AKI superimposed on CKD stage IV.  Baseline creatinine 4.9.  CRRT discontinued 2/25.  No current plan for long-term dialysis.  Follow-up renal services              - Per discussion with nephrology Dr. Verna Czech, keep Foley catheter in to allow accurate I's and O's through Monday; then, can remove and do DC Foley trial while continuing to document strict outputs             - Continue daily assessments of renal function per nephrology   3/8- per renal, Cr leveling out- and con't Foley- will remove Monday  -nephrology following, Cr remains in 4's.   3/14 renal function with some improvement today     -suspect lower extremity edema is related to renal function/low albumin   Appreciate nephro input   -3/17 BUN/creatinine slightly improved 34/3.31, appreciate nephrology assistance  3/19 BUN/CR stable at 34/3.24, nephrology signing off, ok to check BMET twice a week  -Schedule labs Monday and Thursday 9.  C. difficile colitis.  Completed course of oral vancomycin 3/7.  DC'd IV Flagyl 3/3. Enteric precautions   3/14 stools formed this morning   3/17 discussed with ID pharmacy, patient having more frequent bowel movements.  Monitor today and if continues consider Dificid treatment  3/18 diarrhea appears to be improving, continue to monitor for now  3/19 Frequent Bms improved, continue to follow  3/21 having more frequent bowel  movements again  today, continue to monitor and if frequent diarrhea persists this weekend consider treatment with Dificid 10.  Status post left nephrostomy tube d/t staghorn calculi. Has Foley Nephrostomy tube placed 1/29 per IR with plan for ureteric stent placement 3/21              - Careful monitoring of nephrostomy output  -3/14 flomax has been added to improve emptying   -spoke to Dr. Mena Goes today who will perform endoscopy on 3/21 to initially break up calculi/ remove nephrostomy tube. No stent apparently    -he requested prophylactic keflex 250mg  bid beginning 3/17    Still on enteric prec  -Contact IR today regarding blood in nephrostomy-understandable given calculi that can cause irritation, IR PA to evaluate to make sure it is functioning correctly, appreciate assistance  -3/18 nephrostomy tube with good output, only blood-tinged urine today.  Appears to be improving continue to monitor.  Urine culture pending  -3/19 blood tinged urine in tube today, continue current  3/20 urine more clear today, urology procedure tomorrow  -3/21 patient scheduled today for cystoscopy with left retrograde pyelogram, left ureteroscopy laser lithotripsy and left ureteral stent placement.  Also urology planning cystoscopy to evaluate prostate. 11.  Chronic atrial fibrillation with episodes of bradycardia.  Followed by cardiology services.  Continue Eliquis.  Avoid AV nodal blocking agents   12.  Diabetes mellitus.  Hemoglobin A1c 6.2.    -tightly controlled. Dc SSI and change cbg checks to qam only   CBG (last 3)  Recent Labs    07/23/23 1648 07/23/23 2103 07/24/23 0541  GLUCAP 123* 180* 112*      3/21 controlled continue to monitor  13.  Hypotension.  ProAmatine 10 mg 3 times daily.  Monitor with increased mobility   - Normotensive on admission  3/9- BP running 100-s to 120s systolic- con't regimen  3/19 BP has been on high side, will change midodrine to 5mg  TID PRN orthostatic hypotension  3/21 BP stable,  continue current regimen     07/24/2023    4:58 AM 07/23/2023    8:00 PM 07/23/2023    1:37 PM  Vitals with BMI  Weight 219 lbs 13 oz    BMI 32.44    Systolic  143 142  Diastolic  73 75  Pulse  69 69    14.  Chronic anemia.  Niferex daily/Aranesp.  Monitor for any bleeding episodes   3/8- Hb 7.1 today- will order transfusion- transfusion of 1 unit pRBCs.   3/14 hgb up to 9.1  -3/17 hemoglobin down to 8.4, continue to monitor  -3/19 HGB 8.3 today, stable overall, continue to monitor     16. Leukocytosis  3/9- Pt's WBC is 21.7- up from 16k- is afebrile- due to his recent C diff, that just finished correct dosing of ABX for, called ID for guidance on treatment- don't want to cause more Cdiff- ID consulted ---no new orders  3/10 WBC's down to 17k  --have been hovering above and below 20 for awhile. No new clinical symptoms. Nephrostomy drainage clear, yellow  3/14 wbc's down to 13k.--recheck Monday   3/17 WBC is down to 12.4  3/19 WBC 13.4 today, continue to monitor  -will schedule labs Monday and Thursday 17. Decreased mood reported by therapy  -Consider SSRI  -3/20 patient reports his mood is okay, denies depression this morning.  Continue to monitor 18. Insomnia   -3/20 schedule trazodone and increase to 75mg  at bedtime  -3/21 insomnia improved  but bowel movements at night did wake him up a few times 19. Urinary retention requiring IC  -3/21 consider increase Flomax to 0.8 mg persist, urology plans to also perform cystoscopy to evaluate prostate today  LOS: 14 days A FACE TO FACE EVALUATION WAS PERFORMED  Fanny Dance 07/24/2023, 9:32 AM

## 2023-07-24 NOTE — Progress Notes (Signed)
 Physical Therapy Note  Patient Details  Name: Calvin Schultz MRN: [id number] Date of Birth: 12-Oct-1950 Today's Date: 07/24/2023    Pt missed 90 min total of skilled PT due to awaiting transport and then off unit for procedure at [facility]. Transport planned for 11am (start of first session) and then had this patient scheduled again at 1pm. Will follow up as able.   Karolee Stamps Darrol Poke, PT, DPT, CBIS  07/24/2023, 12:21 PM

## 2023-07-25 LAB — GLUCOSE, CAPILLARY
Glucose-Capillary: 115 mg/dL — ABNORMAL HIGH (ref 70–99)
Glucose-Capillary: 136 mg/dL — ABNORMAL HIGH (ref 70–99)

## 2023-07-25 MED ORDER — CHLORHEXIDINE GLUCONATE CLOTH 2 % EX PADS
6.0000 | MEDICATED_PAD | Freq: Two times a day (BID) | CUTANEOUS | Status: DC
  Administered 2023-07-25 – 2023-07-27 (×4): 6 via TOPICAL

## 2023-07-25 MED ORDER — DARBEPOETIN ALFA 40 MCG/0.4ML IJ SOSY
40.0000 ug | PREFILLED_SYRINGE | INTRAMUSCULAR | Status: DC
  Administered 2023-07-25 – 2023-08-07 (×3): 40 ug via SUBCUTANEOUS
  Filled 2023-07-25 (×4): qty 0.4

## 2023-07-25 MED ORDER — PSYLLIUM 95 % PO PACK
1.0000 | PACK | Freq: Every day | ORAL | Status: DC
  Administered 2023-07-25: 1 via ORAL
  Filled 2023-07-25: qty 1

## 2023-07-25 NOTE — Progress Notes (Signed)
 PROGRESS NOTE   Subjective/Complaints: Pt had a reasonable night. Still feels tired. No issues with stent placement yesterday. Pt denies pain. Says he's eating better. Eats what wife brings from home  ROS: Patient denies fever, rash, sore throat, blurred vision, dizziness, nausea, vomiting, diarrhea, cough, shortness of breath or chest pain, joint or back/neck pain, headache, or mood change.    Objective:   DG C-Arm 1-60 Min-No Report Result Date: 07/24/2023 Fluoroscopy was utilized by the requesting physician.  No radiographic interpretation.   DG C-Arm 1-60 Min-No Report Result Date: 07/24/2023 Fluoroscopy was utilized by the requesting physician.  No radiographic interpretation.    No results for input(s): "WBC", "HGB", "HCT", "PLT" in the last 72 hours.  No results for input(s): "NA", "K", "CL", "CO2", "GLUCOSE", "BUN", "CREATININE", "CALCIUM" in the last 72 hours.   Intake/Output Summary (Last 24 hours) at 07/25/2023 0848 Last data filed at 07/25/2023 0745 Gross per 24 hour  Intake 1117 ml  Output 825 ml  Net 292 ml     Pressure Injury 07/10/23 Buttocks Right;Mid;Upper Stage 2 -  Partial thickness loss of dermis presenting as a shallow open injury with a red, pink wound bed without slough. Pink opened stage 2 right upper buttock (Active)  07/10/23 1608  Location: Buttocks  Location Orientation: Right;Mid;Upper  Staging: Stage 2 -  Partial thickness loss of dermis presenting as a shallow open injury with a red, pink wound bed without slough.  Wound Description (Comments): Pink opened stage 2 right upper buttock  Present on Admission: Yes     Pressure Injury 07/10/23 Buttocks Right;Mid;Upper Stage 2 -  Partial thickness loss of dermis presenting as a shallow open injury with a red, pink wound bed without slough. Pink stage 2 directly beside the right upper stage 2 (Active)  07/10/23 1609  Location: Buttocks  Location  Orientation: Right;Mid;Upper  Staging: Stage 2 -  Partial thickness loss of dermis presenting as a shallow open injury with a red, pink wound bed without slough.  Wound Description (Comments): Pink stage 2 directly beside the right upper stage 2  Present on Admission: Yes     Pressure Injury 07/10/23 Buttocks Right;Mid;Lower Stage 2 -  Partial thickness loss of dermis presenting as a shallow open injury with a red, pink wound bed without slough. Pink stage 2 to right buttock below the upper stage 2 (Active)  07/10/23 1610  Location: Buttocks  Location Orientation: Right;Mid;Lower  Staging: Stage 2 -  Partial thickness loss of dermis presenting as a shallow open injury with a red, pink wound bed without slough.  Wound Description (Comments): Pink stage 2 to right buttock below the upper stage 2  Present on Admission: Yes     Pressure Injury 07/10/23 Buttocks Left;Mid;Lower Stage 2 -  Partial thickness loss of dermis presenting as a shallow open injury with a red, pink wound bed without slough. Pink stage 2 to left buttock (Active)  07/10/23 1611  Location: Buttocks  Location Orientation: Left;Mid;Lower  Staging: Stage 2 -  Partial thickness loss of dermis presenting as a shallow open injury with a red, pink wound bed without slough.  Wound Description (Comments): Pink stage 2 to left buttock  Present on Admission: Yes    Physical Exam: Vital Signs Blood pressure 121/80, pulse 99, temperature 98.2 F (36.8 C), resp. rate 18, height 5\' 9"  (1.753 m), weight 99.4 kg, SpO2 99%.  Constitutional: No distress . Vital signs reviewed. HEENT: NCAT, EOMI, oral membranes moist Neck: supple Cardiovascular: RRR without murmur. No JVD    Respiratory/Chest: CTA Bilaterally without wheezes or rales. Normal effort    GI/Abdomen: BS +, non-tender, non-distended Ext: no clubbing, cyanosis, 1-2+ bilateral LE edema,  Psych: pleasant and cooperative Uro: foley bag in place, urine is clear.   Skin: No  evidence of breakdown, no evidence of rash Neurologic: Cranial nerves II through XII grossly intact, motor strength is 4/5 in rght 3- left  deltoid, bicep, tricep, grip,3- RIght 4- left  hip flexor, knee extensors, 4/4 B ankle dorsiflexor and plantar flexor   -Buttocks MASD--not examined today     MSK: Bilateral knee effusions ongoing Left shoulder tight in ER/IR/F/E            Assessment/Plan: 1. Functional deficits which require 3+ hours per day of interdisciplinary therapy in a comprehensive inpatient rehab setting. Physiatrist is providing close team supervision and 24 hour management of active medical problems listed below. Physiatrist and rehab team continue to assess barriers to discharge/monitor patient progress toward functional and medical goals  Care Tool:  Bathing  Bathing activity did not occur:  (patient completed a simulated task at bed LOF) Body parts bathed by patient: Right arm, Left arm, Chest, Abdomen, Front perineal area, Right upper leg, Left upper leg, Face   Body parts bathed by helper: Buttocks, Left lower leg, Right lower leg     Bathing assist Assist Level: Maximal Assistance - Patient 24 - 49%     Upper Body Dressing/Undressing Upper body dressing   What is the patient wearing?: Pull over shirt    Upper body assist Assist Level: Minimal Assistance - Patient > 75%    Lower Body Dressing/Undressing Lower body dressing      What is the patient wearing?: Pants, Underwear/pull up, Incontinence brief     Lower body assist Assist for lower body dressing: Maximal Assistance - Patient 25 - 49%     Toileting Toileting    Toileting assist Assist for toileting: 2 Helpers     Transfers Chair/bed transfer  Transfers assist  Chair/bed transfer activity did not occur: Safety/medical concerns (not safe to get up)  Chair/bed transfer assist level: Minimal Assistance - Patient > 75% (min A slide board, +2 max A squat pivot)      Locomotion Ambulation   Ambulation assist   Ambulation activity did not occur: Safety/medical concerns  Assist level: Dependent - Patient 0% Assistive device: Other (comment) (sara plus) Max distance: 18 steps   Walk 10 feet activity   Assist  Walk 10 feet activity did not occur: Safety/medical concerns        Walk 50 feet activity   Assist Walk 50 feet with 2 turns activity did not occur: Safety/medical concerns         Walk 150 feet activity   Assist Walk 150 feet activity did not occur: Safety/medical concerns         Walk 10 feet on uneven surface  activity   Assist Walk 10 feet on uneven surfaces activity did not occur: Safety/medical concerns         Wheelchair     Assist Is the patient using a wheelchair?: Yes Type of Wheelchair: Power  Wheelchair assist level: Supervision/Verbal cueing Max wheelchair distance: 150'    Wheelchair 50 feet with 2 turns activity    Assist        Assist Level: Supervision/Verbal cueing   Wheelchair 150 feet activity     Assist      Assist Level: Supervision/Verbal cueing   Blood pressure 121/80, pulse 99, temperature 98.2 F (36.8 C), resp. rate 18, height 5\' 9"  (1.753 m), weight 99.4 kg, SpO2 99%.   Medical Problem List and Plan: 1. Functional deficits secondary to debility related to septic shock/C. difficile colitis/AKI superimposed on CKD stage IV             -patient may shower with tubes/drains covered             -ELOS/Goals:  3/28, min assist PT/OT             -Continue CIR therapies including PT, OT   -Expected discharge 4/4   2.  Antithrombotics: -DVT/anticoagulation:  Pharmaceutical: Eliquis             -antiplatelet therapy: N/A   3. Pain Management: tramadol prn, Zanaflex 4 mg twice daily as needed   Biliateral OA of knees  -ordered bilateral neoprine knee sleeves --try today  -voltaren gel tid to knees  -have scheduled tramadol 50mg  at 0700 and 1200 daily.  Continue prn as well  Mild adhesive capsulitis left shoulder---added sports cream. Can use voltaren gel also. Aggressive ROM with therapy and while in bed 4. Mood/Behavior/Sleep: Trazodone 50 mg nightly as needed.  Provide emotional support             -antipsychotic agents: N/A   5. Neuropsych/cognition: This patient is capable of making decisions on his own behalf.   6. Skin/Wound Care: Routine skin checks   7. Fluids/Electrolytes/Nutrition:    -3/14 po intake remains poor, albumin low, weights falling  -will ask RD for assistance. Have spoken to patient about intake  -?IV albumin (see below)  3/18 discussed with dietitian, patient recorded is eating 0% of his meals.  Appears that wife bringing in 3 meals a day and he is eating about 75%, appears to have fairly adequate intake. 8.  Oliguric AKI superimposed on CKD stage IV.  Baseline creatinine 4.9.  CRRT discontinued 2/25.  No current plan for long-term dialysis.  Follow-up renal services              - Per discussion with nephrology Dr. Verna Czech, keep Foley catheter in to allow accurate I's and O's through Monday; then, can remove and do DC Foley trial while continuing to document strict outputs             - Continue daily assessments of renal function per nephrology   3/8- per renal, Cr leveling out- and con't Foley- will remove Monday  -nephrology following, Cr remains in 4's.   3/14 renal function with some improvement today     -suspect lower extremity edema is related to renal function/low albumin   Appreciate nephro input   -3/17 BUN/creatinine slightly improved 34/3.31, appreciate nephrology assistance  3/19 BUN/CR stable at 34/3.24, nephrology signing off, ok to check BMET twice a week  3/22 again encouraged PO, happy that he's eating food from home   -labs again Monday  9.  C. difficile colitis.  Completed course of oral vancomycin 3/7.  DC'd IV Flagyl 3/3. Enteric precautions   3/14 stools formed this morning   3/17  discussed with ID pharmacy, patient having more frequent  bowel movements.  Monitor today and if continues consider Dificid treatment  3/18 diarrhea appears to be improving, continue to monitor for now  3/19 Frequent Bms improved, continue to follow  3/21 having more frequent bowel movements again today, continue to monitor and if frequent diarrhea persists this weekend consider treatment with Dificid  3/22--two semi-formed stools this morning.    -no dificid at this point. Discuss with GI first?    -add fiber to regimen 10.  Status post left nephrostomy tube d/t staghorn calculi. Has Foley Nephrostomy tube placed 1/29 per IR with plan for ureteric stent placement 3/21              - Careful monitoring of nephrostomy output  -3/14 flomax has been added to improve emptying  -3/21 patient scheduled today for cystoscopy with left retrograde pyelogram, left ureteroscopy laser lithotripsy and left ureteral stent placement.     3/22- procedure appears successful thus far.    -urine clear, patient comfortable   -foley to come out on Monday per urology 11.  Chronic atrial fibrillation with episodes of bradycardia.  Followed by cardiology services.  Continue Eliquis.  Avoid AV nodal blocking agents   12.  Diabetes mellitus.  Hemoglobin A1c 6.2.    -tightly controlled. Dc SSI and change cbg checks to qam only   CBG (last 3)  Recent Labs    07/24/23 0541 07/24/23 1145 07/25/23 0707  GLUCAP 112* 115* 115*      3/22 good control 13.  Hypotension.  ProAmatine 10 mg 3 times daily.  Monitor with increased mobility   - Normotensive on admission  3/9- BP running 100-s to 120s systolic- con't regimen  3/19 BP has been on high side, will change midodrine to 5mg  TID PRN orthostatic hypotension  3/21 BP stable, continue current regimen     07/25/2023    6:15 AM 07/25/2023    5:00 AM 07/24/2023    8:42 PM  Vitals with BMI  Weight  219 lbs 2 oz   BMI  32.35   Systolic 121  134  Diastolic 80  72  Pulse  99  72    14.  Chronic anemia.  Niferex daily/Aranesp.  Monitor for any bleeding episodes   3/8- Hb 7.1 today- will order transfusion- transfusion of 1 unit pRBCs.   3/14 hgb up to 9.1  -3/17 hemoglobin down to 8.4, continue to monitor  -3/19 HGB 8.3 today, stable overall, continue to monitor     16. Leukocytosis  3/9- Pt's WBC is 21.7- up from 16k- is afebrile- due to his recent C diff, that just finished correct dosing of ABX for, called ID for guidance on treatment- don't want to cause more Cdiff- ID consulted ---no new orders  3/10 WBC's down to 17k  --have been hovering above and below 20 for awhile. No new clinical symptoms. Nephrostomy drainage clear, yellow  3/14 wbc's down to 13k.--recheck Monday   3/17 WBC is down to 12.4  3/19 WBC 13.4 today, continue to monitor  -scheduled labs Monday and Thursday 17. Decreased mood reported by therapy  -Consider SSRI  -3/20 patient reports his mood is okay, denies depression this morning.  Continue to monitor 18. Insomnia   -3/20 schedule trazodone and increase to 75mg  at bedtime  -3/21 insomnia improved but bowel movements at night did wake him up a few times  3/22 slept better lastnight    LOS: 15 days A FACE TO FACE EVALUATION WAS PERFORMED  Ranelle Oyster 07/25/2023,  8:48 AM

## 2023-07-26 LAB — GLUCOSE, CAPILLARY: Glucose-Capillary: 97 mg/dL (ref 70–99)

## 2023-07-26 MED ORDER — PSYLLIUM 95 % PO PACK
1.0000 | PACK | Freq: Two times a day (BID) | ORAL | Status: DC
  Administered 2023-07-26 – 2023-08-17 (×37): 1 via ORAL
  Filled 2023-07-26 (×48): qty 1

## 2023-07-26 NOTE — Progress Notes (Signed)
 PROGRESS NOTE   Subjective/Complaints: Had a mushy and then two liquid stools overnight/early this morning.  Is comfortable at present albeit a little sleepy.   ROS: Patient denies fever, rash, sore throat, blurred vision, dizziness, nausea, vomiting, diarrhea, cough, shortness of breath or chest pain,   headache, or mood change.     Objective:   DG C-Arm 1-60 Min-No Report Result Date: 07/24/2023 Fluoroscopy was utilized by the requesting physician.  No radiographic interpretation.   DG C-Arm 1-60 Min-No Report Result Date: 07/24/2023 Fluoroscopy was utilized by the requesting physician.  No radiographic interpretation.    No results for input(s): "WBC", "HGB", "HCT", "PLT" in the last 72 hours.  No results for input(s): "NA", "K", "CL", "CO2", "GLUCOSE", "BUN", "CREATININE", "CALCIUM" in the last 72 hours.   Intake/Output Summary (Last 24 hours) at 07/26/2023 0750 Last data filed at 07/26/2023 0732 Gross per 24 hour  Intake 360 ml  Output 675 ml  Net -315 ml     Pressure Injury 07/10/23 Buttocks Right;Mid;Upper Stage 2 -  Partial thickness loss of dermis presenting as a shallow open injury with a red, pink wound bed without slough. Pink opened stage 2 right upper buttock (Active)  07/10/23 1608  Location: Buttocks  Location Orientation: Right;Mid;Upper  Staging: Stage 2 -  Partial thickness loss of dermis presenting as a shallow open injury with a red, pink wound bed without slough.  Wound Description (Comments): Pink opened stage 2 right upper buttock  Present on Admission: Yes     Pressure Injury 07/10/23 Buttocks Right;Mid;Upper Stage 2 -  Partial thickness loss of dermis presenting as a shallow open injury with a red, pink wound bed without slough. Pink stage 2 directly beside the right upper stage 2 (Active)  07/10/23 1609  Location: Buttocks  Location Orientation: Right;Mid;Upper  Staging: Stage 2 -  Partial  thickness loss of dermis presenting as a shallow open injury with a red, pink wound bed without slough.  Wound Description (Comments): Pink stage 2 directly beside the right upper stage 2  Present on Admission: Yes     Pressure Injury 07/10/23 Buttocks Right;Mid;Lower Stage 2 -  Partial thickness loss of dermis presenting as a shallow open injury with a red, pink wound bed without slough. Pink stage 2 to right buttock below the upper stage 2 (Active)  07/10/23 1610  Location: Buttocks  Location Orientation: Right;Mid;Lower  Staging: Stage 2 -  Partial thickness loss of dermis presenting as a shallow open injury with a red, pink wound bed without slough.  Wound Description (Comments): Pink stage 2 to right buttock below the upper stage 2  Present on Admission: Yes     Pressure Injury 07/10/23 Buttocks Left;Mid;Lower Stage 2 -  Partial thickness loss of dermis presenting as a shallow open injury with a red, pink wound bed without slough. Pink stage 2 to left buttock (Active)  07/10/23 1611  Location: Buttocks  Location Orientation: Left;Mid;Lower  Staging: Stage 2 -  Partial thickness loss of dermis presenting as a shallow open injury with a red, pink wound bed without slough.  Wound Description (Comments): Pink stage 2 to left buttock  Present on Admission: Yes  Physical Exam: Vital Signs Blood pressure 105/63, pulse 69, temperature 98 F (36.7 C), resp. rate 18, height 5\' 9"  (1.753 m), weight 99.4 kg, SpO2 99%.  Constitutional: No distress . Vital signs reviewed. HEENT: NCAT, EOMI, oral membranes moist Neck: supple Cardiovascular: RRR without murmur. No JVD    Respiratory/Chest: CTA Bilaterally without wheezes or rales. Normal effort    GI/Abdomen: BS +, non-tender, non-distended Ext: no clubbing, cyanosis, 1-2+ bilateral LE edema,  Psych: pleasant and cooperative Uro: foley bag in place, urine is clear.   Skin: No evidence of breakdown, no evidence of rash Neurologic: Cranial  nerves II through XII grossly intact, motor strength is 4/5 in rght 3- left  deltoid, bicep, tricep, grip,3- RIght 4- left  hip flexor, knee extensors, 4/4 B ankle dorsiflexor and plantar flexor.  C/W today's exam 07/26/2023.    -Buttocks MASD--not examined today     MSK: Bilateral knee effusions ongoing Left shoulder tight in ER/IR/F/E            Assessment/Plan: 1. Functional deficits which require 3+ hours per day of interdisciplinary therapy in a comprehensive inpatient rehab setting. Physiatrist is providing close team supervision and 24 hour management of active medical problems listed below. Physiatrist and rehab team continue to assess barriers to discharge/monitor patient progress toward functional and medical goals  Care Tool:  Bathing  Bathing activity did not occur:  (patient completed a simulated task at bed LOF) Body parts bathed by patient: Right arm, Left arm, Chest, Abdomen, Front perineal area, Right upper leg, Left upper leg, Face   Body parts bathed by helper: Buttocks, Left lower leg, Right lower leg     Bathing assist Assist Level: Maximal Assistance - Patient 24 - 49%     Upper Body Dressing/Undressing Upper body dressing   What is the patient wearing?: Pull over shirt    Upper body assist Assist Level: Minimal Assistance - Patient > 75%    Lower Body Dressing/Undressing Lower body dressing      What is the patient wearing?: Pants, Underwear/pull up, Incontinence brief     Lower body assist Assist for lower body dressing: Maximal Assistance - Patient 25 - 49%     Toileting Toileting    Toileting assist Assist for toileting: 2 Helpers     Transfers Chair/bed transfer  Transfers assist  Chair/bed transfer activity did not occur: Safety/medical concerns (not safe to get up)  Chair/bed transfer assist level: Minimal Assistance - Patient > 75% (min A slide board, +2 max A squat pivot)     Locomotion Ambulation   Ambulation assist    Ambulation activity did not occur: Safety/medical concerns  Assist level: Dependent - Patient 0% Assistive device: Other (comment) (sara plus) Max distance: 18 steps   Walk 10 feet activity   Assist  Walk 10 feet activity did not occur: Safety/medical concerns        Walk 50 feet activity   Assist Walk 50 feet with 2 turns activity did not occur: Safety/medical concerns         Walk 150 feet activity   Assist Walk 150 feet activity did not occur: Safety/medical concerns         Walk 10 feet on uneven surface  activity   Assist Walk 10 feet on uneven surfaces activity did not occur: Safety/medical concerns         Wheelchair     Assist Is the patient using a wheelchair?: Yes Type of Wheelchair: Power    Wheelchair  assist level: Supervision/Verbal cueing Max wheelchair distance: 150'    Wheelchair 50 feet with 2 turns activity    Assist        Assist Level: Supervision/Verbal cueing   Wheelchair 150 feet activity     Assist      Assist Level: Supervision/Verbal cueing   Blood pressure 105/63, pulse 69, temperature 98 F (36.7 C), resp. rate 18, height 5\' 9"  (1.753 m), weight 99.4 kg, SpO2 99%.   Medical Problem List and Plan: 1. Functional deficits secondary to debility related to septic shock/C. difficile colitis/AKI superimposed on CKD stage IV             -patient may shower with tubes/drains covered             -ELOS/Goals:  3/28, min assist PT/OT             -Continue CIR therapies including PT, OT   -Expected discharge 4/4   2.  Antithrombotics: -DVT/anticoagulation:  Pharmaceutical: Eliquis             -antiplatelet therapy: N/A   3. Pain Management: tramadol prn, Zanaflex 4 mg twice daily as needed   Biliateral OA of knees  -ordered bilateral neoprine knee sleeves --try today  -voltaren gel tid to knees  -have scheduled tramadol 50mg  at 0700 and 1200 daily. Continue prn as well  Mild adhesive capsulitis left  shoulder---  sports cream. Can use voltaren gel also. Aggressive ROM with therapy and while in bed--continue 4. Mood/Behavior/Sleep: Trazodone 50 mg nightly as needed.  Provide emotional support             -antipsychotic agents: N/A   5. Neuropsych/cognition: This patient is capable of making decisions on his own behalf.   6. Skin/Wound Care: Routine skin checks   7. Fluids/Electrolytes/Nutrition:    -3/14 po intake remains poor, albumin low, weights falling  -will ask RD for assistance. Have spoken to patient about intake  -?IV albumin (see below)  3/18 discussed with dietitian, patient recorded is eating 0% of his meals.  Appears that wife bringing in 3 meals a day and he is eating about 75%, appears to have fairly adequate intake. 8.  Oliguric AKI superimposed on CKD stage IV.  Baseline creatinine 4.9.  CRRT discontinued 2/25.  No current plan for long-term dialysis.  Follow-up renal services              - Per discussion with nephrology Dr. Verna Czech, keep Foley catheter in to allow accurate I's and O's through Monday; then, can remove and do DC Foley trial while continuing to document strict outputs             - Continue daily assessments of renal function per nephrology   3/8- per renal, Cr leveling out- and con't Foley- will remove Monday  -nephrology following, Cr remains in 4's.   3/14 renal function with some improvement today     -suspect lower extremity edema is related to renal function/low albumin   Appreciate nephro input   -3/17 BUN/creatinine slightly improved 34/3.31, appreciate nephrology assistance  3/19 BUN/CR stable at 34/3.24, nephrology signing off, ok to check BMET twice a week  3/23 encouraging po this weekend. happy that he's eating food from home   -labs again Monday  9.  C. difficile colitis.  Completed course of oral vancomycin 3/7.  DC'd IV Flagyl 3/3. Enteric precautions   3/14 stools formed this morning   3/17 discussed with ID pharmacy, patient having  more  frequent bowel movements.  Monitor today and if continues consider Dificid treatment  3/18 diarrhea appears to be improving, continue to monitor for now  3/19 Frequent Bms improved, continue to follow  3/21 having more frequent bowel movements again today, continue to monitor and if frequent diarrhea persists this weekend consider treatment with Dificid  3/23- mushy/lqiuid stool again overnight    -no dificid at this point. Discuss with GI first on Monday    -added fiber to regimen yesterday, will increase to bid today   -labs pending for tomorrow 10.  Status post left nephrostomy tube d/t staghorn calculi. Has Foley Nephrostomy tube placed 1/29 per IR with plan for ureteric stent placement 3/21              - Careful monitoring of nephrostomy output  -3/14 flomax has been added to improve emptying  -3/21 patient scheduled today for cystoscopy with left retrograde pyelogram, left ureteroscopy laser lithotripsy and left ureteral stent placement.     3/22- procedure appears successful thus far.    -urine clear, patient comfortable   -foley to come out on Monday per urology 11.  Chronic atrial fibrillation with episodes of bradycardia.  Followed by cardiology services.  Continue Eliquis.  Avoid AV nodal blocking agents   12.  Diabetes mellitus.  Hemoglobin A1c 6.2.    -tightly controlled. Dc SSI and change cbg checks to qam only   CBG (last 3)  Recent Labs    07/25/23 0707 07/25/23 2119 07/26/23 0626  GLUCAP 115* 136* 97      3/23 good control, po intake sporadic still though 13.  Hypotension.  ProAmatine 10 mg 3 times daily.  Monitor with increased mobility   - Normotensive on admission  3/9- BP running 100-s to 120s systolic- con't regimen  3/19 BP has been on high side, will change midodrine to 5mg  TID PRN orthostatic hypotension  3/21 BP stable, continue current regimen     07/26/2023    3:37 AM 07/25/2023    8:33 PM 07/25/2023    1:35 PM  Vitals with BMI  Systolic 105 100  135  Diastolic 63 53 69  Pulse 69 69 69    14.  Chronic anemia.  Niferex daily/Aranesp.  Monitor for any bleeding episodes   3/8- Hb 7.1 today- will order transfusion- transfusion of 1 unit pRBCs.   3/14 hgb up to 9.1  -3/17 hemoglobin down to 8.4, continue to monitor  -3/19 HGB 8.3 today, stable overall, continue to monitor     16. Leukocytosis  3/9- Pt's WBC is 21.7- up from 16k- is afebrile- due to his recent C diff, that just finished correct dosing of ABX for, called ID for guidance on treatment- don't want to cause more Cdiff- ID consulted ---no new orders  3/10 WBC's down to 17k  --have been hovering above and below 20 for awhile. No new clinical symptoms. Nephrostomy drainage clear, yellow  3/14 wbc's down to 13k.--recheck Monday   3/17 WBC is down to 12.4  3/19 WBC 13.4 today, continue to monitor  -scheduled labs Monday and Thursday 17. Decreased mood reported by therapy  -Consider SSRI  -3/20 patient reports his mood is okay, denies depression this morning.  Continue to monitor 18. Insomnia   -3/20 schedule trazodone and increase to 75mg  at bedtime  -3/21 insomnia improved but bowel movements at night did wake him up a few times  3/23 sleeping better when stooling isn't an issue    LOS: 16 days A  FACE TO FACE EVALUATION WAS PERFORMED  Ranelle Oyster 07/26/2023, 7:50 AM

## 2023-07-27 ENCOUNTER — Encounter (HOSPITAL_COMMUNITY): Payer: Self-pay | Admitting: Urology

## 2023-07-27 LAB — CBC
HCT: 27.6 % — ABNORMAL LOW (ref 39.0–52.0)
Hemoglobin: 8 g/dL — ABNORMAL LOW (ref 13.0–17.0)
MCH: 26.1 pg (ref 26.0–34.0)
MCHC: 29 g/dL — ABNORMAL LOW (ref 30.0–36.0)
MCV: 90.2 fL (ref 80.0–100.0)
Platelets: 349 10*3/uL (ref 150–400)
RBC: 3.06 MIL/uL — ABNORMAL LOW (ref 4.22–5.81)
RDW: 16.3 % — ABNORMAL HIGH (ref 11.5–15.5)
WBC: 18.7 10*3/uL — ABNORMAL HIGH (ref 4.0–10.5)
nRBC: 0 % (ref 0.0–0.2)

## 2023-07-27 LAB — BASIC METABOLIC PANEL
Anion gap: 9 (ref 5–15)
BUN: 40 mg/dL — ABNORMAL HIGH (ref 8–23)
CO2: 14 mmol/L — ABNORMAL LOW (ref 22–32)
Calcium: 8.3 mg/dL — ABNORMAL LOW (ref 8.9–10.3)
Chloride: 112 mmol/L — ABNORMAL HIGH (ref 98–111)
Creatinine, Ser: 3.26 mg/dL — ABNORMAL HIGH (ref 0.61–1.24)
GFR, Estimated: 20 mL/min — ABNORMAL LOW (ref >60)
Glucose, Bld: 86 mg/dL (ref 70–99)
Potassium: 3.9 mmol/L (ref 3.5–5.1)
Sodium: 135 mmol/L (ref 135–145)

## 2023-07-27 LAB — GLUCOSE, CAPILLARY: Glucose-Capillary: 97 mg/dL (ref 70–99)

## 2023-07-27 NOTE — Progress Notes (Signed)
 Occupational Therapy Weekly Progress Note  Patient Details  Name: Conner Muegge MRN: [id number] Date of Birth: March 13, 1951  Beginning of progress report period: July 20, 2023 End of progress report period: July 27, 2023     Patient has met 1 of 2 short term goals.  Pt has partly met one goal related to lateral scoot transfers using SB as pt has not consistently required Min A of 1 person to determine that it was met.   Patient continues to demonstrate the following deficits: muscle weakness and decreased activity tolerance/endurance, chronic knee pain, decreased cardiorespiratoy endurance, and decreased sitting balance, decreased standing balance, decreased postural control, and decreased balance strategies and therefore will continue to benefit from skilled OT intervention to enhance overall performance with BADL and Reduce care partner burden.  Patient progressing toward long term goals..  Continue plan of care.  OT Short Term Goals Week 2:  OT Short Term Goal 1 (Week 2): Pt will complete LB dressing while seated on EOB with Mod A while utilizing AE as needed. OT Short Term Goal 1 - Progress (Week 2): Met OT Short Term Goal 2 (Week 2): Pt will consistently demonstrate lateral scoot transfer at Min A with use of SB when transferring from surface to surface. OT Short Term Goal 2 - Progress (Week 2): Partly met Week 3:  OT Short Term Goal 1 (Week 3): Pt will consistently demonstrate lateral scoot transfer at Min A with use of SB when transferring from surface to surface. OT Short Term Goal 2 (Week 3): Pt will complete toileting with Mod A while utilizing drop arm BSC.   Therapy/Group: Individual Therapy  Limmie Patricia, OTR/L,CBIS  Supplemental OT - [facility] Secure Chat Preferred   07/27/2023, 8:32 AM

## 2023-07-27 NOTE — Progress Notes (Signed)
 Occupational Therapy Session Note  Patient Details  Name: Calvin Schultz MRN: [id number] Date of Birth: 1951/05/01  {CHL IP REHAB OT TIME CALCULATIONS:304400400}   Short Term Goals: Week 3:  OT Short Term Goal 1 (Week 3): Pt will consistently demonstrate lateral scoot transfer at Min A with use of SB when transferring from surface to surface. OT Short Term Goal 2 (Week 3): Pt will complete toileting with Mod A while utilizing drop arm BSC.  Skilled Therapeutic Interventions/Progress Updates:    Patient agreeable to participate in OT session. Reports *** pain level.   Patient participated in skilled OT session focusing on ***. Therapist facilitated/assessed/developed/educated/integrated/elicited *** in order to improve/facilitate/promote    Therapy Documentation Precautions:  Precautions Precautions: Fall Restrictions Weight Bearing Restrictions Per Provider Order: No  Therapy/Group: Individual Therapy  Limmie Patricia, OTR/L,CBIS  Supplemental OT - [facility] Secure Chat Preferred   07/27/2023, 5:15 PM

## 2023-07-27 NOTE — Progress Notes (Signed)
 Occupational Therapy Session Note  Patient Details  Name: Calvin Schultz MRN: [id number] Date of Birth: 1951-03-26  Today's Date: 07/27/2023 OT Individual Time: 0800-0900 OT Individual Time Calculation (min): 60 min    Short Term Goals: Week 2:  OT Short Term Goal 1 (Week 2): Pt will complete LB dressing while seated on EOB with Mod A while utilizing AE as needed. OT Short Term Goal 1 - Progress (Week 2): Met OT Short Term Goal 2 (Week 2): Pt will consistently demonstrate lateral scoot transfer at Min A with use of SB when transferring from surface to surface. OT Short Term Goal 2 - Progress (Week 2): Partly met  Skilled Therapeutic Interventions/Progress Updates: Patient received resting in bed. Reports feeling stiff and painful, but agreeable to getting OOB with therapist help to perform ADL's. Patient feels that being in bed for 3 days without exercise has increased his stiffness in B Knees. Patient assisted to the EOB from supine with Mod assist. Patient needing Max assist with slideboard transfer to w/c for bathing at the sink. Patient with difficulty bearing weight through LE's during the transfer and has difficulty with forward lean to unweight for lateral scoot. Assited patient to the sink where he was able to perform grooming and UE bathing with set up. Needed Max assist for LB bathing. Patient able to don shirt sitting in the w/c but performed LE dressing at bed level. Continue with skilled OT POC for ADL retraining and transfer training. Patient received pain medication during treatment, but appeared more limited this session compared to treatment the week prior.     Therapy Documentation Precautions:  Precautions Precautions: Fall Precaution/Restrictions Comments: L nephrostomy tube Restrictions Weight Bearing Restrictions Per Provider Order: No General:   Vital Signs: Therapy Vitals Temp: 97.9 F (36.6 C) Pulse Rate: 69 Resp: 18 BP: 117/62 Patient Position (if  appropriate): Lying Oxygen Therapy SpO2: 100 % O2 Device: Room Air Pain: Pain Assessment Pain Scale: 0-10 Pain Score: 9  Pain Location: Knee Pain Intervention(s): Medication (See eMAR)   Therapy/Group: Individual Therapy  Warnell Forester 07/27/2023, 11:07 AM

## 2023-07-27 NOTE — Progress Notes (Signed)
 Patient ID: Calvin Schultz, male   DOB: December 08, 1950, 73 y.o.   MRN: [id number]  Foley discontinued with no discomfort to patient. Will scan bladder in 6 hrs. If patient unable to void, will in/out cath patient. Patient tolerated removal of foley with no adverse effects.

## 2023-07-27 NOTE — Progress Notes (Signed)
 Occupational Therapy Session Note  Patient Details  Name: Calvin Schultz MRN: [id number] Date of Birth: 1951/03/25  {CHL IP REHAB OT TIME CALCULATIONS:304400400}   Short Term Goals: Week 3:  OT Short Term Goal 1 (Week 3): Pt will consistently demonstrate lateral scoot transfer at Min A with use of SB when transferring from surface to surface. OT Short Term Goal 2 (Week 3): Pt will complete toileting with Mod A while utilizing drop arm BSC.  Skilled Therapeutic Interventions/Progress Updates:      Therapy Documentation Precautions:  Precautions Precautions: Fall Precaution/Restrictions Comments: L nephrostomy tube Restrictions Weight Bearing Restrictions Per Provider Order: No   Therapy/Group: Individual Therapy  Lou Cal, OTR/L, MSOT  07/27/2023, 10:05 PM

## 2023-07-27 NOTE — Progress Notes (Signed)
 PROGRESS NOTE   Subjective/Complaints: Reports having 2 liquid/soft bowel movements a yesterday.  LBM this morning.  Feels a little weaker overall after not having worked with therapy for about 3 days.  ROS: Patient denies fever, new vision changes, dizziness, nausea, vomiting, shortness of breath or chest pain, headache, or mood change. +diarrhea    Objective:   No results found.   Recent Labs    07/27/23 0709  WBC 18.7*  HGB 8.0*  HCT 27.6*  PLT 349    Recent Labs    07/27/23 0709  NA 135  K 3.9  CL 112*  CO2 14*  GLUCOSE 86  BUN 40*  CREATININE 3.26*  CALCIUM 8.3*     Intake/Output Summary (Last 24 hours) at 07/27/2023 1422 Last data filed at 07/27/2023 1307 Gross per 24 hour  Intake 300 ml  Output 1125 ml  Net -825 ml     Pressure Injury 07/10/23 Buttocks Right;Mid;Upper Stage 2 -  Partial thickness loss of dermis presenting as a shallow open injury with a red, pink wound bed without slough. Pink opened stage 2 right upper buttock (Active)  07/10/23 1608  Location: Buttocks  Location Orientation: Right;Mid;Upper  Staging: Stage 2 -  Partial thickness loss of dermis presenting as a shallow open injury with a red, pink wound bed without slough.  Wound Description (Comments): Pink opened stage 2 right upper buttock  Present on Admission: Yes     Pressure Injury 07/10/23 Buttocks Right;Mid;Upper Stage 2 -  Partial thickness loss of dermis presenting as a shallow open injury with a red, pink wound bed without slough. Pink stage 2 directly beside the right upper stage 2 (Active)  07/10/23 1609  Location: Buttocks  Location Orientation: Right;Mid;Upper  Staging: Stage 2 -  Partial thickness loss of dermis presenting as a shallow open injury with a red, pink wound bed without slough.  Wound Description (Comments): Pink stage 2 directly beside the right upper stage 2  Present on Admission: Yes      Pressure Injury 07/10/23 Buttocks Right;Mid;Lower Stage 2 -  Partial thickness loss of dermis presenting as a shallow open injury with a red, pink wound bed without slough. Pink stage 2 to right buttock below the upper stage 2 (Active)  07/10/23 1610  Location: Buttocks  Location Orientation: Right;Mid;Lower  Staging: Stage 2 -  Partial thickness loss of dermis presenting as a shallow open injury with a red, pink wound bed without slough.  Wound Description (Comments): Pink stage 2 to right buttock below the upper stage 2  Present on Admission: Yes     Pressure Injury 07/10/23 Buttocks Left;Mid;Lower Stage 2 -  Partial thickness loss of dermis presenting as a shallow open injury with a red, pink wound bed without slough. Pink stage 2 to left buttock (Active)  07/10/23 1611  Location: Buttocks  Location Orientation: Left;Mid;Lower  Staging: Stage 2 -  Partial thickness loss of dermis presenting as a shallow open injury with a red, pink wound bed without slough.  Wound Description (Comments): Pink stage 2 to left buttock  Present on Admission: Yes    Physical Exam: Vital Signs Blood pressure 117/62, pulse 69, temperature 97.9 F (  36.6 C), resp. rate 18, height 5\' 9"  (1.753 m), weight 102 kg, SpO2 100%.  Constitutional: No distress . Vital signs reviewed. HEENT: NCAT, EOMI, oral membranes moist Neck: supple Cardiovascular: RRR without murmur. No JVD    Respiratory/Chest: CTA Bilaterally without wheezes or rales. Normal effort    GI/Abdomen: BS +, non-tender, non-distended Ext: no clubbing, cyanosis, 1-2+ bilateral LE edema,  Psych: pleasant and cooperative Uro: foley bag in place, urine is bloody in color Skin: No evidence of breakdown, no evidence of rash Neurologic: Cranial nerves II through XII grossly intact, motor strength is 4/5 in rght 3- left  deltoid, bicep, tricep, grip,3- RIght 4- left  hip flexor, knee extensors, 4/4 B ankle dorsiflexor and plantar flexor.  C/W today's exam  07/27/2023.    -Buttocks MASD--not examined today     MSK: Bilateral knee effusions ongoing Left shoulder tight in ER/IR/F/E            Assessment/Plan: 1. Functional deficits which require 3+ hours per day of interdisciplinary therapy in a comprehensive inpatient rehab setting. Physiatrist is providing close team supervision and 24 hour management of active medical problems listed below. Physiatrist and rehab team continue to assess barriers to discharge/monitor patient progress toward functional and medical goals  Care Tool:  Bathing  Bathing activity did not occur:  (patient completed a simulated task at bed LOF) Body parts bathed by patient: Right arm, Left arm, Chest, Abdomen, Front perineal area, Right upper leg, Left upper leg, Face   Body parts bathed by helper: Buttocks, Left lower leg, Right lower leg     Bathing assist Assist Level: Maximal Assistance - Patient 24 - 49%     Upper Body Dressing/Undressing Upper body dressing   What is the patient wearing?: Pull over shirt    Upper body assist Assist Level: Contact Guard/Touching assist    Lower Body Dressing/Undressing Lower body dressing      What is the patient wearing?: Pants, Underwear/pull up, Incontinence brief     Lower body assist Assist for lower body dressing: Maximal Assistance - Patient 25 - 49%     Toileting Toileting    Toileting assist Assist for toileting: 2 Helpers     Transfers Chair/bed transfer  Transfers assist  Chair/bed transfer activity did not occur: Safety/medical concerns (not safe to get up)  Chair/bed transfer assist level: Maximal Assistance - Patient 25 - 49%     Locomotion Ambulation   Ambulation assist   Ambulation activity did not occur: Safety/medical concerns  Assist level: Dependent - Patient 0% Assistive device: Other (comment) (sara plus) Max distance: 18 steps   Walk 10 feet activity   Assist  Walk 10 feet activity did not occur:  Safety/medical concerns        Walk 50 feet activity   Assist Walk 50 feet with 2 turns activity did not occur: Safety/medical concerns         Walk 150 feet activity   Assist Walk 150 feet activity did not occur: Safety/medical concerns         Walk 10 feet on uneven surface  activity   Assist Walk 10 feet on uneven surfaces activity did not occur: Safety/medical concerns         Wheelchair     Assist Is the patient using a wheelchair?: Yes Type of Wheelchair: Power    Wheelchair assist level: Supervision/Verbal cueing Max wheelchair distance: 150'    Wheelchair 50 feet with 2 turns activity    Assist  Assist Level: Supervision/Verbal cueing   Wheelchair 150 feet activity     Assist      Assist Level: Supervision/Verbal cueing   Blood pressure 117/62, pulse 69, temperature 97.9 F (36.6 C), resp. rate 18, height 5\' 9"  (1.753 m), weight 102 kg, SpO2 100%.   Medical Problem List and Plan: 1. Functional deficits secondary to debility related to septic shock/C. difficile colitis/AKI superimposed on CKD stage IV             -patient may shower with tubes/drains covered             -ELOS/Goals:  3/28, min assist PT/OT             -Continue CIR therapies including PT, OT   -Expected discharge 4/4   2.  Antithrombotics: -DVT/anticoagulation:  Pharmaceutical: Eliquis             -antiplatelet therapy: N/A   3. Pain Management: tramadol prn, Zanaflex 4 mg twice daily as needed   Biliateral OA of knees  -ordered bilateral neoprine knee sleeves --try today  -voltaren gel tid to knees  -have scheduled tramadol 50mg  at 0700 and 1200 daily. Continue prn as well  Mild adhesive capsulitis left shoulder---  sports cream. Can use voltaren gel also. Aggressive ROM with therapy and while in bed--continue 4. Mood/Behavior/Sleep: Trazodone 50 mg nightly as needed.  Provide emotional support             -antipsychotic agents: N/A   5.  Neuropsych/cognition: This patient is capable of making decisions on his own behalf.   6. Skin/Wound Care: Routine skin checks   7. Fluids/Electrolytes/Nutrition:    -3/14 po intake remains poor, albumin low, weights falling  -will ask RD for assistance. Have spoken to patient about intake  -?IV albumin (see below)  3/18 discussed with dietitian, patient recorded is eating 0% of his meals.  Appears that wife bringing in 3 meals a day and he is eating about 75%, appears to have fairly adequate intake. 8.  Oliguric AKI superimposed on CKD stage IV.  Baseline creatinine 4.9.  CRRT discontinued 2/25.  No current plan for long-term dialysis.  Follow-up renal services              - Per discussion with nephrology Dr. Verna Czech, keep Foley catheter in to allow accurate I's and O's through Monday; then, can remove and do DC Foley trial while continuing to document strict outputs             - Continue daily assessments of renal function per nephrology   3/8- per renal, Cr leveling out- and con't Foley- will remove Monday  -nephrology following, Cr remains in 4's.   3/14 renal function with some improvement today     -suspect lower extremity edema is related to renal function/low albumin   Appreciate nephro input   -3/17 BUN/creatinine slightly improved 34/3.31, appreciate nephrology assistance  3/19 BUN/CR stable at 34/3.24, nephrology signing off, ok to check BMET twice a week  3/23 encouraging po this weekend. happy that he's eating food from home  3/24 BUN/CR overall stable at 40/3.26 9.  C. difficile colitis.  Completed course of oral vancomycin 3/7.  DC'd IV Flagyl 3/3. Enteric precautions   3/14 stools formed this morning   3/17 discussed with ID pharmacy, patient having more frequent bowel movements.  Monitor today and if continues consider Dificid treatment  3/18 diarrhea appears to be improving, continue to monitor for now  3/19 Frequent Bms improved,  continue to follow  3/21 having more  frequent bowel movements again today, continue to monitor and if frequent diarrhea persists this weekend consider treatment with Dificid  3/23- mushy/lqiuid stool again overnight    -no dificid at this point. Discuss with GI first on Monday    -added fiber to regimen yesterday, will increase to bid today  3/24 mushy/ liquid BMs, about 2 a day yesterday, more other days- will discuss GI 10.  Status post left nephrostomy tube d/t staghorn calculi. Has Foley Nephrostomy tube placed 1/29 per IR with plan for ureteric stent placement 3/21              - Careful monitoring of nephrostomy output  -3/14 flomax has been added to improve emptying  -3/21 patient scheduled today for cystoscopy with left retrograde pyelogram, left ureteroscopy laser lithotripsy and left ureteral stent placement.     3/22- procedure appears successful thus far.    -urine clear, patient comfortable   -foley to come out on Monday per urology 11.  Chronic atrial fibrillation with episodes of bradycardia.  Followed by cardiology services.  Continue Eliquis.  Avoid AV nodal blocking agents   12.  Diabetes mellitus.  Hemoglobin A1c 6.2.    -tightly controlled. Dc SSI and change cbg checks to qam only   CBG (last 3)  Recent Labs    07/25/23 2119 07/26/23 0626 07/27/23 0618  GLUCAP 136* 97 97      3/23 good control, po intake sporadic still though  3/24 CBGs well controlled 13.  Hypotension.  ProAmatine 10 mg 3 times daily.  Monitor with increased mobility   - Normotensive on admission  3/9- BP running 100-s to 120s systolic- con't regimen  3/19 BP has been on high side, will change midodrine to 5mg  TID PRN orthostatic hypotension  3/24 BP well controlled     07/27/2023    6:27 AM 07/27/2023    6:21 AM 07/26/2023    8:06 PM  Vitals with BMI  Weight 224 lbs 14 oz    BMI 33.19    Systolic  117 129  Diastolic  62 66  Pulse  69 68    14.  Chronic anemia.  Niferex daily/Aranesp.  Monitor for any bleeding episodes    3/8- Hb 7.1 today- will order transfusion- transfusion of 1 unit pRBCs.   3/14 hgb up to 9.1  -3/17 hemoglobin down to 8.4, continue to monitor  -3/19 HGB 8.3 today, stable overall, continue to monitor     16. Leukocytosis  3/9- Pt's WBC is 21.7- up from 16k- is afebrile- due to his recent C diff, that just finished correct dosing of ABX for, called ID for guidance on treatment- don't want to cause more Cdiff- ID consulted ---no new orders  3/10 WBC's down to 17k  --have been hovering above and below 20 for awhile. No new clinical symptoms. Nephrostomy drainage clear, yellow  3/14 wbc's down to 13k.--recheck Monday   3/17 WBC is down to 12.4  3/19 WBC 13.4 today, continue to monitor  3/24 WBC higher today 18.7, Discuss C diff treatment with GI a above 17. Decreased mood reported by therapy  -Consider SSRI  -3/20 patient reports his mood is okay, denies depression this morning.  Continue to monitor 18. Insomnia   -3/20 schedule trazodone and increase to 75mg  at bedtime  -3/21 insomnia improved but bowel movements at night did wake him up a few times  3/23 sleeping better when stooling isn't an issue  LOS: 17 days A FACE TO FACE EVALUATION WAS PERFORMED  Fanny Dance 07/27/2023, 2:22 PM

## 2023-07-27 NOTE — Progress Notes (Signed)
 Physical Therapy Session Note  Patient Details  Name: Hoa Deriso MRN: [id number] Date of Birth: [birth year]  Today's Date: 07/27/2023 PT Individual Time: [id number], 1415-1530 PT Individual Time Calculation (min): 74 min, 75 min   Short Term Goals: Week 2:  PT Short Term Goal 1 (Week 2): pt will initiate gait training PT Short Term Goal 2 (Week 2): pt will complete sit to stand with LRAD and mod A or less PT Short Term Goal 3 (Week 2): Pt will complete bed to chair transfer with mod A or less consistently PT Short Term Goal 4 (Week 2): pt will perform custom WC eval  Skilled Therapeutic Interventions/Progress Updates:      Treatment Session 1   Pt seated in power WC upon arrival. Pt agreeable to therapy. Pt reports pain in B LE, not quantified, premedicated. Pt provided rest breaks and repositioning as needed. Pt overall frustrated and reports he hasn't had therapy since prior to procedure on Friday and pt feeling overall weaker as a result. Therapist notified scheduling.   Pt performed slide board transfer with increased time and min A power WC to bed. Pt performed squat pivot transfer bed to TIS with +2 max A.   Pt performed sit to stand in // bars with +1 mod A, verbal and tactile cues provided for hip and knee extension. Pt abel to tolerate standing for 1.5 min x 3.  Pt attempted to ambulate in // bars however unable 2/2 pain in B knee. Pt performed 1x10 lateral weight shifting with verbal/tactile cues for technique. Pt performed alternating heel raises x10.   Pt performed squat pivot transfer +2 max A TIS to mat table, mat table to power WC, verbal and tactile cues provided for technique.   Pt seated in power WC at end of session with all needs within reach.   Treatment Session 2   Pt seated in power WC upon arrival. Pt agreeable to therapy.   Pt self propelled power WC to ortho gym with supervision, verbal cues provided for slowing down with navigating in narrow spaces.   Pt  attempted to perform slide board transfer to car simualtor with use of elevator function of power WC with use of +3 A however discontinued for safety. Plan to trail on Wednesday at 1:00 to pt actual car.   Pt slide board transfer power WC to heightened mat table with elevator function and min-mod A and B LE on foot rests versus floor, verbal cues provided for technqiue.   Pt performed tricep extension on foam yoga blocks x10 holding for 3 seconds. Pt performed lateral scooting along edge of mat table with supervision, pt demos improved buttock clearance with verbal cues provided for tricep extension.   Pt performed slide board transfer mat table to [facility] with therapist placing and stabilizng the board.   Pt seated in power WC at end of session with all needs within reach and bed alarm on.   Therapy Documentation Precautions:  Precautions Precautions: Fall Precaution/Restrictions Comments: L nephrostomy tube Restrictions Weight Bearing Restrictions Per Provider Order: No  Therapy/Group: Individual Therapy  [facility], [place], DPT  07/27/2023, 4:14 PM

## 2023-07-28 ENCOUNTER — Encounter (HOSPITAL_COMMUNITY): Payer: Self-pay | Admitting: Urology

## 2023-07-28 DIAGNOSIS — I10 Essential (primary) hypertension: Secondary | ICD-10-CM

## 2023-07-28 MED ORDER — TAMSULOSIN HCL 0.4 MG PO CAPS
0.8000 mg | ORAL_CAPSULE | Freq: Every day | ORAL | Status: DC
  Administered 2023-07-28 – 2023-08-20 (×24): 0.8 mg via ORAL
  Filled 2023-07-28 (×25): qty 2

## 2023-07-28 NOTE — Progress Notes (Signed)
 Physical Therapy Session Note  Patient Details  Name: Calvin Schultz MRN: [id number] Date of Birth: [birth year]  Today's Date: 07/28/2023 PT Individual Time: 0930-1000, 1101-1130 PT Individual Time Calculation (min): 30 min, 29 min   Short Term Goals: Week 3:  PT Short Term Goal 1 (Week 3): Pt will trail slide board transfer to pt car PT Short Term Goal 2 (Week 3): pt will complete custom WC eval PT Short Term Goal 3 (Week 3): pt will perform sit to stand with RW and and mod A or less  Skilled Therapeutic Interventions/Progress Updates:      Treatment Session 1   Pt present with PT, OT and [name] from [employer] for custom power WC consult. PT consulted with OT, pt, pt wife, and [name] to   Pt, pt caregiver [name], ATP, PT and OT present for custom W/C evaluation in order to increase functional mobility in order to complete ADLs and community integration. Pt will be receiving [employer] J4E power WC with tilt, elevator function and elevating leg rests for adequate pressure relief, edema management, positioning, and independence with slide board transfers to pt ford explorer.   Pt initially very fatigued at start of session and requiring max instructional verbal cues for safety with navigating WC in narrow spaces,however at end of session demos improved understanding and recall of proper technique.   Pt seated in power WC at end of session with all needs within reach and seatbelt on.   Treatment Session 2   Pt supine in bed upon arrival. Pt agreeable to therapy. Pt reports feeling sore.   Therapist performed thorough assessment of sensation, strength, ROM, edema for power WC LMN.   Pt performed supine to sit with use of bed features and supervision.   Pt performed slide board transfer with therapist placing slide board and holding it in place bed to power WC with increased time. Pt wife present to observe. Plan to intiaite hands on training next session to allow pt wife to get  more comfortable with pt handling.   Pt scooted posteriorly as much as possible in seat, but required tilt function for adequate positioning.   Pt positioned WC next to bed with cues from pt wife for navigating different obstacles in pt rear view.   Pt seated in power WC with all needs within reach and seatbelt on.     Therapy Documentation Precautions:  Precautions Precautions: Fall Precaution/Restrictions Comments: L nephrostomy tube Restrictions Weight Bearing Restrictions Per Provider Order: No  Therapy/Group: Individual Therapy  [facility] [place], [place], DPT  07/28/2023, 12:34 PM

## 2023-07-28 NOTE — Anesthesia Postprocedure Evaluation (Signed)
 Anesthesia Post Note  Patient: Calvin Schultz  Procedure(s) Performed: CYSTOSCOPY/URETEROSCOPY/HOLMIUM LASER/ STENT PLACEMENT/REMOVAL OF NEPHROSTOMY TUBE (Left)     Patient location during evaluation: PACU Anesthesia Type: General Level of consciousness: awake and alert Pain management: pain level controlled Vital Signs Assessment: post-procedure vital signs reviewed and stable Respiratory status: spontaneous breathing, nonlabored ventilation, respiratory function stable and patient connected to nasal cannula oxygen Cardiovascular status: blood pressure returned to baseline and stable Postop Assessment: no apparent nausea or vomiting Anesthetic complications: no   No notable events documented.  Last Vitals:  Vitals:   07/27/23 2042 07/28/23 0555  BP: 128/60 105/64  Pulse: 69 69  Resp: 18 18  Temp: 36.7 C 36.6 C  SpO2: 100% 100%    Last Pain:  Vitals:   07/28/23 0832  TempSrc:   PainSc: 8                  [name] S

## 2023-07-28 NOTE — Progress Notes (Signed)
 Physical Therapy Session Note  Patient Details  Name: Calvin Schultz MRN: [id number] Date of Birth: 1950/08/05  Today's Date: 07/28/2023 PT Individual Time: 1504-1600 PT Individual Time Calculation (min): 56 min   Short Term Goals: Week 2:  PT Short Term Goal 1 (Week 2): pt will initiate gait training PT Short Term Goal 1 - Progress (Week 2): Met PT Short Term Goal 2 (Week 2): pt will complete sit to stand with LRAD and mod A or less PT Short Term Goal 2 - Progress (Week 2): Met (in // bars) PT Short Term Goal 3 (Week 2): Pt will complete bed to chair transfer with mod A or less consistently PT Short Term Goal 3 - Progress (Week 2): Met PT Short Term Goal 4 (Week 2): pt will perform custom WC eval PT Short Term Goal 4 - Progress (Week 2): Progressing toward goal Week 3:  PT Short Term Goal 1 (Week 3): Pt will trail slide board transfer to pt car PT Short Term Goal 2 (Week 3): pt will complete custom WC eval PT Short Term Goal 3 (Week 3): pt will perform sit to stand with RW and and mod A or less Week 4:     Skilled Therapeutic Interventions/Progress Updates:      Therapy Documentation Precautions:  Precautions Precautions: Fall Precaution/Restrictions Comments: L nephrostomy tube Restrictions Weight Bearing Restrictions Per Provider Order: No General:   Vital Signs:   Pain:   Mobility:   Locomotion :    Trunk/Postural Assessment :    Balance:   Exercises:   Other Treatments:      Therapy/Group: Individual Therapy  Loel Dubonnet PT, DPT, CSRS 07/28/2023, 6:43 PM

## 2023-07-28 NOTE — Progress Notes (Signed)
 PROGRESS NOTE   Subjective/Complaints: Patient reports he is doing well overall.  Had 2 bowel movements this morning, mushy.  Reports he is tired from working with therapy yesterday.  ROS: Patient denies fever, new vision changes, dizziness, nausea, vomiting, shortness of breath or chest pain, headache, or mood change. +diarrhea- improved    Objective:   No results found.   Recent Labs    07/27/23 0709  WBC 18.7*  HGB 8.0*  HCT 27.6*  PLT 349    Recent Labs    07/27/23 0709  NA 135  K 3.9  CL 112*  CO2 14*  GLUCOSE 86  BUN 40*  CREATININE 3.26*  CALCIUM 8.3*     Intake/Output Summary (Last 24 hours) at 07/28/2023 1345 Last data filed at 07/28/2023 1251 Gross per 24 hour  Intake 120 ml  Output 780 ml  Net -660 ml     Pressure Injury 07/10/23 Buttocks Right;Mid;Upper Stage 2 -  Partial thickness loss of dermis presenting as a shallow open injury with a red, pink wound bed without slough. Pink opened stage 2 right upper buttock (Active)  07/10/23 1608  Location: Buttocks  Location Orientation: Right;Mid;Upper  Staging: Stage 2 -  Partial thickness loss of dermis presenting as a shallow open injury with a red, pink wound bed without slough.  Wound Description (Comments): Pink opened stage 2 right upper buttock  Present on Admission: Yes     Pressure Injury 07/10/23 Buttocks Right;Mid;Upper Stage 2 -  Partial thickness loss of dermis presenting as a shallow open injury with a red, pink wound bed without slough. Pink stage 2 directly beside the right upper stage 2 (Active)  07/10/23 1609  Location: Buttocks  Location Orientation: Right;Mid;Upper  Staging: Stage 2 -  Partial thickness loss of dermis presenting as a shallow open injury with a red, pink wound bed without slough.  Wound Description (Comments): Pink stage 2 directly beside the right upper stage 2  Present on Admission: Yes     Pressure Injury  07/10/23 Buttocks Right;Mid;Lower Stage 2 -  Partial thickness loss of dermis presenting as a shallow open injury with a red, pink wound bed without slough. Pink stage 2 to right buttock below the upper stage 2 (Active)  07/10/23 1610  Location: Buttocks  Location Orientation: Right;Mid;Lower  Staging: Stage 2 -  Partial thickness loss of dermis presenting as a shallow open injury with a red, pink wound bed without slough.  Wound Description (Comments): Pink stage 2 to right buttock below the upper stage 2  Present on Admission: Yes     Pressure Injury 07/10/23 Buttocks Left;Mid;Lower Stage 2 -  Partial thickness loss of dermis presenting as a shallow open injury with a red, pink wound bed without slough. Pink stage 2 to left buttock (Active)  07/10/23 1611  Location: Buttocks  Location Orientation: Left;Mid;Lower  Staging: Stage 2 -  Partial thickness loss of dermis presenting as a shallow open injury with a red, pink wound bed without slough.  Wound Description (Comments): Pink stage 2 to left buttock  Present on Admission: Yes    Physical Exam: Vital Signs Blood pressure (!) 146/75, pulse 69, temperature 97.6 F (36.4  C), resp. rate 18, height 5\' 9"  (1.753 m), weight 102.8 kg, SpO2 100%.  Constitutional: No distress . Vital signs reviewed.  Working with therapy in his room HEENT: NCAT, EOMI, oral membranes moist Neck: supple Cardiovascular: RRR without murmur. No JVD    Respiratory/Chest: CTA Bilaterally without wheezes or rales. Normal effort    GI/Abdomen: BS +, non-tender, non-distended Ext: no clubbing, cyanosis, 1-2+ bilateral LE edema,  Psych: pleasant and cooperative Uro: Foley has been discontinued Skin: No evidence of breakdown, no evidence of rash Neurologic: Cranial nerves II through XII grossly intact, motor strength is 4/5 in rght 3- left  deltoid, bicep, tricep, grip,3- RIght 4- left  hip flexor, knee extensors, 4/4 B ankle dorsiflexor and plantar flexor.  C/W today's  exam 07/28/2023.    -Buttocks MASD--not examined today     MSK: Bilateral knee effusions ongoing Left shoulder tight in ER/IR/F/E            Assessment/Plan: 1. Functional deficits which require 3+ hours per day of interdisciplinary therapy in a comprehensive inpatient rehab setting. Physiatrist is providing close team supervision and 24 hour management of active medical problems listed below. Physiatrist and rehab team continue to assess barriers to discharge/monitor patient progress toward functional and medical goals  Care Tool:  Bathing  Bathing activity did not occur:  (patient completed a simulated task at bed LOF) Body parts bathed by patient: Right arm, Left arm, Chest, Abdomen, Front perineal area, Right upper leg, Left upper leg, Face   Body parts bathed by helper: Buttocks, Left lower leg, Right lower leg     Bathing assist Assist Level: Maximal Assistance - Patient 24 - 49%     Upper Body Dressing/Undressing Upper body dressing   What is the patient wearing?: Pull over shirt    Upper body assist Assist Level: Contact Guard/Touching assist    Lower Body Dressing/Undressing Lower body dressing      What is the patient wearing?: Pants, Underwear/pull up, Incontinence brief     Lower body assist Assist for lower body dressing: Maximal Assistance - Patient 25 - 49%     Toileting Toileting    Toileting assist Assist for toileting: 2 Helpers     Transfers Chair/bed transfer  Transfers assist  Chair/bed transfer activity did not occur: Safety/medical concerns (not safe to get up)  Chair/bed transfer assist level: Maximal Assistance - Patient 25 - 49%     Locomotion Ambulation   Ambulation assist   Ambulation activity did not occur: Safety/medical concerns  Assist level: Dependent - Patient 0% Assistive device: Other (comment) (sara plus) Max distance: 18 steps   Walk 10 feet activity   Assist  Walk 10 feet activity did not occur:  Safety/medical concerns        Walk 50 feet activity   Assist Walk 50 feet with 2 turns activity did not occur: Safety/medical concerns         Walk 150 feet activity   Assist Walk 150 feet activity did not occur: Safety/medical concerns         Walk 10 feet on uneven surface  activity   Assist Walk 10 feet on uneven surfaces activity did not occur: Safety/medical concerns         Wheelchair     Assist Is the patient using a wheelchair?: Yes Type of Wheelchair: Power    Wheelchair assist level: Supervision/Verbal cueing Max wheelchair distance: 150'    Wheelchair 50 feet with 2 turns activity    Assist  Assist Level: Supervision/Verbal cueing   Wheelchair 150 feet activity     Assist      Assist Level: Supervision/Verbal cueing   Blood pressure (!) 146/75, pulse 69, temperature 97.6 F (36.4 C), resp. rate 18, height 5\' 9"  (1.753 m), weight 102.8 kg, SpO2 100%.   Medical Problem List and Plan: 1. Functional deficits secondary to debility related to septic shock/C. difficile colitis/AKI superimposed on CKD stage IV             -patient may shower with tubes/drains covered             -ELOS/Goals:  3/28, min assist PT/OT             -Continue CIR therapies including PT, OT   -Expected discharge 4/4  -Team conference tomorrow   2.  Antithrombotics: -DVT/anticoagulation:  Pharmaceutical: Eliquis             -antiplatelet therapy: N/A   3. Pain Management: tramadol prn, Zanaflex 4 mg twice daily as needed   Biliateral OA of knees  -ordered bilateral neoprine knee sleeves --try today  -voltaren gel tid to knees  -have scheduled tramadol 50mg  at 0700 and 1200 daily. Continue prn as well  Mild adhesive capsulitis left shoulder---  sports cream. Can use voltaren gel also. Aggressive ROM with therapy and while in bed--continue 4. Mood/Behavior/Sleep: Trazodone 50 mg nightly as needed.  Provide emotional support              -antipsychotic agents: N/A   5. Neuropsych/cognition: This patient is capable of making decisions on his own behalf.   6. Skin/Wound Care: Routine skin checks   7. Fluids/Electrolytes/Nutrition:    -3/14 po intake remains poor, albumin low, weights falling  -will ask RD for assistance. Have spoken to patient about intake  -?IV albumin (see below)  3/18 discussed with dietitian, patient recorded is eating 0% of his meals.  Appears that wife bringing in 3 meals a day and he is eating about 75%, appears to have fairly adequate intake. 8.  Oliguric AKI superimposed on CKD stage IV.  Baseline creatinine 4.9.  CRRT discontinued 2/25.  No current plan for long-term dialysis.  Follow-up renal services              - Per discussion with nephrology Dr. Verna Czech, keep Foley catheter in to allow accurate I's and O's through Monday; then, can remove and do DC Foley trial while continuing to document strict outputs             - Continue daily assessments of renal function per nephrology   3/8- per renal, Cr leveling out- and con't Foley- will remove Monday  -nephrology following, Cr remains in 4's.   3/14 renal function with some improvement today     -suspect lower extremity edema is related to renal function/low albumin   Appreciate nephro input   -3/17 BUN/creatinine slightly improved 34/3.31, appreciate nephrology assistance  3/19 BUN/CR stable at 34/3.24, nephrology signing off, ok to check BMET twice a week  3/23 encouraging po this weekend. happy that he's eating food from home  3/24 BUN/CR overall stable at 40/3.26  -Recheck Thursday 9.  C. difficile colitis.  Completed course of oral vancomycin 3/7.  DC'd IV Flagyl 3/3. Enteric precautions   3/14 stools formed this morning   3/17 discussed with ID pharmacy, patient having more frequent bowel movements.  Monitor today and if continues consider Dificid treatment  3/18 diarrhea appears to be improving, continue to  monitor for now  3/19 Frequent  Bms improved, continue to follow  3/21 having more frequent bowel movements again today, continue to monitor and if frequent diarrhea persists this weekend consider treatment with Dificid  3/23- mushy/lqiuid stool again overnight    -no dificid at this point. Discuss with GI first on Monday    -added fiber to regimen yesterday, will increase to bid today  3/24 mushy/ liquid BMs, about 2 a day yesterday, more other days- will discuss GI  3/25 GI recommended repeat C. difficile screening. Discussed with [facility]ol[facility], hold off on repeat screening test, he only had about 2 bowel movements a day but if continues to have frequent BMs consider retreatment with oral vancomycin for 10 days 10.  Status post left nephrostomy tube d/t staghorn calculi. Has Foley Nephrostomy tube placed 1/29 per IR with plan for ureteric stent placement 3/21              - Careful monitoring of nephrostomy output  -3/14 flomax has been added to improve emptying  -3/21 patient scheduled today for cystoscopy with left retrograde pyelogram, left ureteroscopy laser lithotripsy and left ureteral stent placement.     3/22- procedure appears successful thus far.    -urine clear, patient comfortable   -foley to come out on Monday per urology  3/25 Foley was removed patient continues to have urinary retention requiring IC.  Increase Flomax to 0.8 mg 11.  Chronic atrial fibrillation with episodes of bradycardia.  Followed by cardiology services.  Continue Eliquis.  Avoid AV nodal blocking agents   12.  Diabetes mellitus.  Hemoglobin A1c 6.2.    -tightly controlled. Dc SSI and change cbg checks to qam only   CBG (last 3)  Recent Labs    07/25/23 2119 07/26/23 0626 07/27/23 0618  GLUCAP 136* 97 97      3/23 good control, po intake sporadic still though  3/25 controlled continue to monitor 13.  Hypotension.  ProAmatine 10 mg 3 times daily.  Monitor with increased mobility   - Normotensive on admission  3/9- BP running 100-s  to 120s systolic- con't regimen  3/19 BP has been on high side, will change midodrine to 5mg  TID PRN orthostatic hypotension  3/25 BP stable overall, continue current regimen     07/28/2023    1:41 PM 07/28/2023    5:55 AM 07/28/2023    5:00 AM  Vitals with BMI  Weight   226 lbs 10 oz  BMI   33.45  Systolic 146 105   Diastolic 75 64   Pulse 69 69     14.  Chronic anemia.  Niferex daily/Aranesp.  Monitor for any bleeding episodes   3/8- Hb 7.1 today- will order transfusion- transfusion of 1 unit pRBCs.   3/14 hgb up to 9.1  -3/17 hemoglobin down to 8.4, continue to monitor  -3/19 HGB 8.3 today, stable overall, continue to monitor     16. Leukocytosis  3/9- Pt's WBC is 21.7- up from 16k- is afebrile- due to his recent C diff, that just finished correct dosing of ABX for, called ID for guidance on treatment- don't want to cause more Cdiff- ID consulted ---no new orders  3/10 WBC's down to 17k  --have been hovering above and below 20 for awhile. No new clinical symptoms. Nephrostomy drainage clear, yellow  3/14 wbc's down to 13k.--recheck Monday   3/17 WBC is down to 12.4  3/19 WBC 13.4 today, continue to monitor  3/24 WBC higher today  18.7, Discuss C diff treatment with GI a above 17. Decreased mood reported by therapy  -Consider SSRI  -3/20 patient reports his mood is okay, denies depression this morning.  Continue to monitor 18. Insomnia   -3/20 schedule trazodone and increase to 75mg  at bedtime  -3/21 insomnia improved but bowel movements at night did wake him up a few times  3/23 sleeping better when stooling isn't an issue    LOS: 18 days A FACE TO FACE EVALUATION WAS PERFORMED  Fanny Dance 07/28/2023, 1:45 PM

## 2023-07-28 NOTE — Transfer of Care (Signed)
 Immediate Anesthesia Transfer of Care Note  Patient: Calvin Schultz  Procedure(s) Performed: CYSTOSCOPY/URETEROSCOPY/HOLMIUM LASER/ STENT PLACEMENT/REMOVAL OF NEPHROSTOMY TUBE (Left)  Patient Location: PACU  Anesthesia Type:General  Level of Consciousness: sedated  Airway & Oxygen Therapy: Patient Spontanous Breathing and Patient connected to face mask oxygen  Post-op Assessment: Report given to RN and Post -op Vital signs reviewed and stable  Post vital signs: Reviewed and stable  Last Vitals:  Vitals Value Taken Time  BP 156/81 07/24/23 1730  Temp 36.4 C 07/24/23 1700  Pulse 65 07/24/23 1730  Resp 16 07/24/23 1730  SpO2 99 % 07/24/23 1730    Last Pain:  Vitals:   07/28/23 1348  TempSrc:   PainSc: 8       Patients Stated Pain Goal: 3 (07/27/23 0823)  Complications: No notable events documented.

## 2023-07-28 NOTE — Progress Notes (Signed)
 Physical Therapy Weekly Progress Note  Patient Details  Name: Calvin Schultz MRN: [id number] Date of Birth: [birth year]  Beginning of progress report period: July 20, 2023 End of progress report period: July 25, 2023   Patient has met 3 of 4 short term goals.  Pt overall level of assist fluctates with pain/fatigue. Pt is completing bed mobility with use of bed features and sup/min A. Slide board transfer on level surfaces with min-mod A. Pt ambualted 18 steps with use of +2 and sara plus. Pt performed sit to stand in // bars with mod A. Custom WC eval scheduled for 07/28/23. D/C Scheduled or 4/4.   Patient continues to demonstrate the following deficits muscle weakness and muscle joint tightness, decreased cardiorespiratoy endurance, and decreased sitting balance, decreased standing balance, decreased balance strategies, and difficulty maintaining precautions and therefore will continue to benefit from skilled PT intervention to increase functional independence with mobility.  Patient progressing toward long term goals..  Continue plan of care.  PT Short Term Goals Week 2:  PT Short Term Goal 1 (Week 2): pt will initiate gait training PT Short Term Goal 1 - Progress (Week 2): Met PT Short Term Goal 2 (Week 2): pt will complete sit to stand with LRAD and mod A or less PT Short Term Goal 2 - Progress (Week 2): Met (in // bars) PT Short Term Goal 3 (Week 2): Pt will complete bed to chair transfer with mod A or less consistently PT Short Term Goal 3 - Progress (Week 2): Met PT Short Term Goal 4 (Week 2): pt will perform custom WC eval PT Short Term Goal 4 - Progress (Week 2): Progressing toward goal Week 3:  PT Short Term Goal 1 (Week 3): Pt will trail slide board transfer to pt car PT Short Term Goal 2 (Week 3): pt will complete custom WC eval PT Short Term Goal 3 (Week 3): pt will perform sit to stand with RW and and mod A or less   Therapy Documentation Precautions:   Precautions Precautions: Fall Precaution/Restrictions Comments: L nephrostomy tube Restrictions Weight Bearing Restrictions Per Provider Order: No   Therapy/Group: Individual Therapy  [facility] [place], [place], DPT  07/28/2023, 7:39 AM

## 2023-07-29 DIAGNOSIS — D649 Anemia, unspecified: Secondary | ICD-10-CM

## 2023-07-29 NOTE — Progress Notes (Addendum)
 PROGRESS NOTE   Subjective/Complaints: Pt did have few Bms in last 24 hours but he feels diarrhea improving and Bms becoming more firm. Had some bleeding with IC yesterday but reports last IC was normal/yellow in color.  Nursing thinks may have false passage.   ROS: Patient denies fever, new vision changes, dizziness, nausea, vomiting, shortness of breath or chest pain, headache, or mood change. +diarrhea- improved +urinary retention    Objective:   No results found.   Recent Labs    07/27/23 0709  WBC 18.7*  HGB 8.0*  HCT 27.6*  PLT 349    Recent Labs    07/27/23 0709  NA 135  K 3.9  CL 112*  CO2 14*  GLUCOSE 86  BUN 40*  CREATININE 3.26*  CALCIUM 8.3*     Intake/Output Summary (Last 24 hours) at 07/29/2023 1154 Last data filed at 07/29/2023 0630 Gross per 24 hour  Intake 240 ml  Output 1600 ml  Net -1360 ml     Pressure Injury 07/10/23 Buttocks Right;Mid;Upper Stage 2 -  Partial thickness loss of dermis presenting as a shallow open injury with a red, pink wound bed without slough. Pink opened stage 2 right upper buttock (Active)  07/10/23 1608  Location: Buttocks  Location Orientation: Right;Mid;Upper  Staging: Stage 2 -  Partial thickness loss of dermis presenting as a shallow open injury with a red, pink wound bed without slough.  Wound Description (Comments): Pink opened stage 2 right upper buttock  Present on Admission: Yes     Pressure Injury 07/10/23 Buttocks Right;Mid;Upper Stage 2 -  Partial thickness loss of dermis presenting as a shallow open injury with a red, pink wound bed without slough. Pink stage 2 directly beside the right upper stage 2 (Active)  07/10/23 1609  Location: Buttocks  Location Orientation: Right;Mid;Upper  Staging: Stage 2 -  Partial thickness loss of dermis presenting as a shallow open injury with a red, pink wound bed without slough.  Wound Description (Comments): Pink  stage 2 directly beside the right upper stage 2  Present on Admission: Yes     Pressure Injury 07/10/23 Buttocks Right;Mid;Lower Stage 2 -  Partial thickness loss of dermis presenting as a shallow open injury with a red, pink wound bed without slough. Pink stage 2 to right buttock below the upper stage 2 (Active)  07/10/23 1610  Location: Buttocks  Location Orientation: Right;Mid;Lower  Staging: Stage 2 -  Partial thickness loss of dermis presenting as a shallow open injury with a red, pink wound bed without slough.  Wound Description (Comments): Pink stage 2 to right buttock below the upper stage 2  Present on Admission: Yes     Pressure Injury 07/10/23 Buttocks Left;Mid;Lower Stage 2 -  Partial thickness loss of dermis presenting as a shallow open injury with a red, pink wound bed without slough. Pink stage 2 to left buttock (Active)  07/10/23 1611  Location: Buttocks  Location Orientation: Left;Mid;Lower  Staging: Stage 2 -  Partial thickness loss of dermis presenting as a shallow open injury with a red, pink wound bed without slough.  Wound Description (Comments): Pink stage 2 to left buttock  Present on Admission:  Yes    Physical Exam: Vital Signs Blood pressure 114/69, pulse 67, temperature 97.9 F (36.6 C), resp. rate 18, height 5\' 9"  (1.753 m), weight 103.2 kg, SpO2 100%.  Constitutional: No distress . Vital signs reviewed. Sitting in [facility], wife his his room HEENT: NCAT, EOMI, oral membranes moist Neck: supple Cardiovascular: RRR without murmur. No JVD    Respiratory/Chest: CTA Bilaterally without wheezes or rales. Normal effort    GI/Abdomen: BS +, non-tender, non-distended Ext: no clubbing, cyanosis, 1-2+ bilateral LE edema,  Psych: pleasant and cooperative Uro: Foley has been discontinued Skin: No evidence of breakdown, no evidence of rash Neurologic: Cranial nerves II through XII grossly intact, motor strength is 4/5 in rght 3- left  deltoid, bicep, tricep, grip,3- RIght  4- left  hip flexor, knee extensors, 4/4 B ankle dorsiflexor and plantar flexor.  C/W today's exam 07/29/2023.    -Buttocks MASD--not examined today     MSK: Bilateral knee effusions ongoing Left shoulder tight in ER/IR/F/E            Assessment/Plan: 1. Functional deficits which require 3+ hours per day of interdisciplinary therapy in a comprehensive inpatient rehab setting. Physiatrist is providing close team supervision and 24 hour management of active medical problems listed below. Physiatrist and rehab team continue to assess barriers to discharge/monitor patient progress toward functional and medical goals  Care Tool:  Bathing  Bathing activity did not occur:  (patient completed a simulated task at bed LOF) Body parts bathed by patient: Right arm, Left arm, Chest, Abdomen, Front perineal area, Right upper leg, Left upper leg, Face   Body parts bathed by helper: Buttocks, Left lower leg, Right lower leg     Bathing assist Assist Level: Maximal Assistance - Patient 24 - 49%     Upper Body Dressing/Undressing Upper body dressing   What is the patient wearing?: Pull over shirt    Upper body assist Assist Level: Contact Guard/Touching assist    Lower Body Dressing/Undressing Lower body dressing      What is the patient wearing?: Pants, Underwear/pull up, Incontinence brief     Lower body assist Assist for lower body dressing: Maximal Assistance - Patient 25 - 49%     Toileting Toileting    Toileting assist Assist for toileting: 2 Helpers     Transfers Chair/bed transfer  Transfers assist  Chair/bed transfer activity did not occur: Safety/medical concerns (not safe to get up)  Chair/bed transfer assist level: Maximal Assistance - Patient 25 - 49%     Locomotion Ambulation   Ambulation assist   Ambulation activity did not occur: Safety/medical concerns  Assist level: Dependent - Patient 0% Assistive device: Other (comment) (sara plus) Max distance:  18 steps   Walk 10 feet activity   Assist  Walk 10 feet activity did not occur: Safety/medical concerns        Walk 50 feet activity   Assist Walk 50 feet with 2 turns activity did not occur: Safety/medical concerns         Walk 150 feet activity   Assist Walk 150 feet activity did not occur: Safety/medical concerns         Walk 10 feet on uneven surface  activity   Assist Walk 10 feet on uneven surfaces activity did not occur: Safety/medical concerns         Wheelchair     Assist Is the patient using a wheelchair?: Yes Type of Wheelchair: Power    Wheelchair assist level: Supervision/Verbal cueing Max  wheelchair distance: 150'    Wheelchair 50 feet with 2 turns activity    Assist        Assist Level: Supervision/Verbal cueing   Wheelchair 150 feet activity     Assist      Assist Level: Supervision/Verbal cueing   Blood pressure 114/69, pulse 67, temperature 97.9 F (36.6 C), resp. rate 18, height 5\' 9"  (1.753 m), weight 103.2 kg, SpO2 100%.   Medical Problem List and Plan: 1. Functional deficits secondary to debility related to septic shock/C. difficile colitis/AKI superimposed on CKD stage IV             -patient may shower with tubes/drains covered             -ELOS/Goals:  3/28, min assist PT/OT             -Continue CIR therapies including PT, OT   -Expected discharge 4/4  -Team conference today please see physician documentation under team conference tab, met with team  to discuss problems,progress, and goals. Formulized individual treatment plan based on medical history, underlying problem and comorbidities.     2.  Antithrombotics: -DVT/anticoagulation:  Pharmaceutical: Eliquis             -antiplatelet therapy: N/A   3. Pain Management: tramadol prn, Zanaflex 4 mg twice daily as needed   Biliateral OA of knees  -ordered bilateral neoprine knee sleeves --try today  -voltaren gel tid to knees  -have scheduled  tramadol 50mg  at 0700 and 1200 daily. Continue prn as well  Mild adhesive capsulitis left shoulder---  sports cream. Can use voltaren gel also. Aggressive ROM with therapy and while in bed--continue 4. Mood/Behavior/Sleep: Trazodone 50 mg nightly as needed.  Provide emotional support             -antipsychotic agents: N/A   5. Neuropsych/cognition: This patient is capable of making decisions on his own behalf.   6. Skin/Wound Care: Routine skin checks   7. Fluids/Electrolytes/Nutrition:    -3/14 po intake remains poor, albumin low, weights falling  -will ask RD for assistance. Have spoken to patient about intake  -?IV albumin (see below)  3/18 discussed with dietitian, patient recorded is eating 0% of his meals.  Appears that wife bringing in 3 meals a day and he is eating about 75%, appears to have fairly adequate intake. 8.  Oliguric AKI superimposed on CKD stage IV.  Baseline creatinine 4.9.  CRRT discontinued 2/25.  No current plan for long-term dialysis.  Follow-up renal services              - Per discussion with nephrology Dr. Verna Czech, keep Foley catheter in to allow accurate I's and O's through Monday; then, can remove and do DC Foley trial while continuing to document strict outputs             - Continue daily assessments of renal function per nephrology   3/8- per renal, Cr leveling out- and con't Foley- will remove Monday  -nephrology following, Cr remains in 4's.   3/14 renal function with some improvement today     -suspect lower extremity edema is related to renal function/low albumin   Appreciate nephro input   -3/17 BUN/creatinine slightly improved 34/3.31, appreciate nephrology assistance  3/19 BUN/CR stable at 34/3.24, nephrology signing off, ok to check BMET twice a week  3/23 encouraging po this weekend. happy that he's eating food from home  3/24 BUN/CR overall stable at 40/3.26  [facility] tomorrow 9.  C.  difficile colitis.  Completed course of oral vancomycin 3/7.   DC'd IV Flagyl 3/3. Enteric precautions   3/14 stools formed this morning   3/17 discussed with ID pharmacy, patient having more frequent bowel movements.  Monitor today and if continues consider Dificid treatment  3/18 diarrhea appears to be improving, continue to monitor for now  3/19 Frequent Bms improved, continue to follow  3/21 having more frequent bowel movements again today, continue to monitor and if frequent diarrhea persists this weekend consider treatment with Dificid  3/23- mushy/lqiuid stool again overnight    -no dificid at this point. Discuss with GI first on Monday    -added fiber to regimen yesterday, will increase to bid today  3/24 mushy/ liquid BMs, about 2 a day yesterday, more other days- will discuss GI  3/25 GI recommended repeat C. difficile screening. Discussed with [facility]ol[facility], hold off on repeat screening test, he only had about 2 bowel movements a day but if continues to have frequent BMs consider retreatment with oral vancomycin for 10 days  3/26 pt reports Bms more firm, continue to monitor  10.  Status post left nephrostomy tube d/t staghorn calculi. Has Foley Nephrostomy tube placed 1/29 per IR with plan for ureteric stent placement 3/21              - Careful monitoring of nephrostomy output  -3/14 flomax has been added to improve emptying  -3/21 patient scheduled today for cystoscopy with left retrograde pyelogram, left ureteroscopy laser lithotripsy and left ureteral stent placement.     3/22- procedure appears successful thus far.    -urine clear, patient comfortable   -foley to come out on Monday per urology  3/25 Foley was removed patient continues to have urinary retention requiring IC.  Increase Flomax to 0.8 mg  3/26 intermittent blood noted with IC, if continues will restart foley 11.  Chronic atrial fibrillation with episodes of bradycardia.  Followed by cardiology services.  Continue Eliquis.  Avoid AV nodal blocking agents   12.  Diabetes  mellitus.  Hemoglobin A1c 6.2.    -tightly controlled. Dc SSI and change cbg checks to qam only   CBG (last 3)  Recent Labs    07/27/23 0618  GLUCAP 97      3/23 good control, po intake sporadic still though 13.  Hypotension.  ProAmatine 10 mg 3 times daily.  Monitor with increased mobility   - Normotensive on admission  3/9- BP running 100-s to 120s systolic- con't regimen  3/19 BP has been on high side, will change midodrine to 5mg  TID PRN orthostatic hypotension  3/26 well controlled, continue current      07/29/2023    4:56 AM 07/28/2023    7:48 PM 07/28/2023    1:41 PM  Vitals with BMI  Weight 227 lbs 8 oz    BMI 33.58    Systolic 114 126 161  Diastolic 69 63 75  Pulse 67 67 69    14.  Chronic anemia.  Niferex daily/Aranesp.  Monitor for any bleeding episodes   3/8- Hb 7.1 today- will order transfusion- transfusion of 1 unit pRBCs.   3/14 hgb up to 9.1  -3/17 hemoglobin down to 8.4, continue to monitor  -3/19 HGB 8.3 today, stable overall, continue to monitor    Recheck tomorrow   16. Leukocytosis  3/9- Pt's WBC is 21.7- up from 16k- is afebrile- due to his recent C diff, that just finished correct dosing of ABX for, called ID  for guidance on treatment- don't want to cause more Cdiff- ID consulted ---no new orders  3/10 WBC's down to 17k  --have been hovering above and below 20 for awhile. No new clinical symptoms. Nephrostomy drainage clear, yellow  3/14 wbc's down to 13k.--recheck Monday   3/17 WBC is down to 12.4  3/19 WBC 13.4 today, continue to monitor  3/24 WBC higher today 18.7, Discuss C diff treatment with GI a above  Recheck tomorrow  17. Decreased mood reported by therapy  -Consider SSRI  -3/20 patient reports his mood is okay, denies depression this morning.  Continue to monitor 18. Insomnia   -3/20 schedule trazodone and increase to 75mg  at bedtime  -3/21 insomnia improved but bowel movements at night did wake him up a few times  3/23 sleeping better  when stooling isn't an issue    LOS: 19 days A FACE TO FACE EVALUATION WAS PERFORMED  Fanny Dance 07/29/2023, 11:54 AM

## 2023-07-29 NOTE — Progress Notes (Signed)
 Physical Therapy Session Note  Patient Details  Name: Calvin Schultz MRN: [id number] Date of Birth: 08/08/1950  Today's Date: 07/29/2023 PT Individual Time: [phone], 1330-1430 PT Individual Time Calculation (min): 74 min, 60 min   Short Term Goals: Week 3:  PT Short Term Goal 1 (Week 3): Pt will trail slide board transfer to pt car PT Short Term Goal 2 (Week 3): pt will complete custom WC eval PT Short Term Goal 3 (Week 3): pt will perform sit to stand with RW and and mod A or less PT Short Term Goal 4 (Week 3): pt wife will begin hands on family training  Skilled Therapeutic Interventions/Progress Updates:      Treatment Session 1  Pt supine in bed upon arrival. Pt agreeable to therapy. Pt reports 10/10 pain in B LE, notified nurse, nurse present to administer pain medication.   Supine to sit EOB with use of bed features and min A for trunk, of note posterior bias requiring min A for correction while taking medicine seated EOB.   Pt wife positioned power WC next to bed for slide board transfer. Pt wife placed and removed board while pt performed lateral trunk lean to L, verbal cues provided from therapist for proper positioning of board. Pt wife stood in front of pt to guard for anterior slippage while stabilizing board while pt performed level/downhill slide board transfer bed to WC.   Pt performed posterior scoot with combination of reciprocal scoot and use of tilt function for gravity assisted scoot, pt wife managed pt arm rests, and foot pedals as needed.   Initiated sit to stand with use of RW, pt performed x5 with +2 max A, pt demos improved upright positoning with each rep but overall demos crouched standing 2/2 B knee pain/crepitus, decreased hip/knee/tricep extension despite verbal and tactile cues for correction 2/2 strength deficits.   Pt performed seated active assisted LAQ, with pt assisting into remaining tolerable motion-empty end feel--pt performed quad sets holding for  5 seconds at end of motion with each rep.   Pt raised leg rest with power control and performed 1x10 combined ankle pump, quad set, and glute set holding for 5 seconds B.   Pt performed 1x10 seated WC push ups in power WC, pt demos tricep activation but unable to get full clearance-pt able to hold for ~3 seconds each rep.   Pt navigated power WC, up and down ramp x2 to simulate home entry, with supervision, verbal cues provided for obstacle negotiation with turning around on platform at top of ramp.   Pt navigated around obstacle course with power WC with supervision, verbal cues provided intermittently for obstacle negotiation.   Pt positioned WC next to bed in room with mod I.   Education provided to utilize leg rests in raised position between sessions for edema management. Pt verablized understanding and agreeable.   Pt seated in [facility] with all needs within reach and wife in the room.   Treatment Session 2   Pt seated in power WC upon arrival. Pt agreeable to therapy. Pt denies any pain.   Pt navigated power WC in hallways, in/out of service elevator, and to outside with supervision, verbal cues provided for safety in the elevator, navigating pedestrians (pt announcing his presence).   Pt performed slide board transfer power WC (with elevator function) to pt ford explorer with +2 min A for placement/removal of slide board, and intermittently assisting with LE management. Pt demos improved ability to get into pt seat in  comparison to car simualtor but pt unable to tolerate the hip and knee flexion to lift legs into car. Plan to continue to work on hip and knee flexion over threshold for improved independence with transportation to follow up appointments. Discussed non emergency medical transport for transportation home. Pt and wife verbalized understanding and agreeable. Discussed alternative option of purchasing wheelchair accessible van. Pt and pt wife appear to be open to this option if  needed.   Discussed trailing slide board transfer to back seat for improved leg room however upon attempt unable to place slide board correctly 2/2 contour of seat and decreased opening of back door. Opted not to trail for pt safety.   Pt navigated power WC on uneven terrain out doors up/down ramp with supervision, until very end pt veered to R (with distraction) and went off of sidewalk path with R anterior/posterior wheel on mulch. Pt required total A from therapist to get out. Therapist provided education on importance of navigating in middle of sidewalk upon return up ramp versus veering to one side of the other for pt overall safety, and to prevent pt from tipping the power WC. Pt demos improved technique upon return.   Pt seated in power WC at end of session with all needs within reach and seatbelt on.    Therapy Documentation Precautions:  Precautions Precautions: Fall Precaution/Restrictions Comments: L nephrostomy tube Restrictions Weight Bearing Restrictions Per Provider Order: No  Therapy/Group: Individual Therapy  [facility] Ambrose Finland, Calvin, DPT  07/29/2023, 7:53 AM

## 2023-07-29 NOTE — Patient Care Conference (Signed)
 Inpatient RehabilitationTeam Conference and Plan of Care Update Date: 07/29/2023   Time: 12:12 PM    Patient Name: Calvin Schultz      Medical Record Number: [id number]  Date of Birth: 06-20-1950 Sex: Male         Room/Bed: 4M01C/4M01C-01 Payor Info: Payor: [employer] / Plan: [place] PREFERRED / Product Type: *No Product type* /    Admit Date/Time:  07/10/2023  2:55 PM  Primary Diagnosis:  Debility  Hospital Problems: Principal Problem:   Debility    Expected Discharge Date: Expected Discharge Date: 08/07/23  Team Members Present: Physician leading conference: Dr. Fanny Dance Social Worker Present: Dossie Der, LCSW Nurse Present: Chana Bode, RN PT Present: Ambrose Finland, PT OT Present: Candee Furbish, OT PPS Coordinator present : Fae Pippin, SLP     Current Status/Progress Goal Weekly Team Focus  Bowel/Bladder   nephromostomy tube removed. patient unable to urinate. i/o cath q6 patient incontient of bowel LBM 3/26 patient is on enteric contact for C-Diff   regain continence   continue voiding trials    Swallow/Nutrition/ Hydration               ADL's   Set-up UB ADL, Max LB Bathing (bed level), Mod A LB dressing (with use of reacher and sock aid). Continues to flucuate between 1-2 person assist for SB transfers.   Setup/supervision for UB care; Min A for LB care & functional transfers   Continue working on SB transfers working towards toileting on [facility] core strength and lateral weight shifting (side to side) to work on Runner, broadcasting/film/video during toileting.    Mobility   level of assist overall fluctuates with pain/fatigue bed mobility supervision/min A with use of bed features, slide board transfer with CGA-mod Awith pain/fatigue. Sit to stand with mod-max A in // bars, and with +2 max A with RW, ambulation 18 steps in sara plus, trailed ambulation in // bars, pt unable to get clearance for advancement but able to perform heel raise, began slide board transfer to car  simulator however required +3 and unsuccessful--will trail with pt car   min A/mod A  follow up HHPT, DME: WC eval completed 3/25, power WC to be delivered to house on day of discharge, pt will need non emergency medical transport, slide board, D/C 4/4. barriers to discharge: pain/incontinence,  car transfer to pt car planned for 3/26    Communication                Safety/Cognition/ Behavioral Observations               Pain   patient has chronic knee pain 8/10. patient uses kpad, diclofenac, and tylenol for pain management   3/10 on pain scale   assess pain qshift and PRN    Skin   stage 2 healing on buttocks   promote wound healing  assess qshift and PRN Turn patient q2hr      Discharge Planning:  Wife here daily and supportive, pt still requires much care. Will need to see if wife can provide the care he requires at DC   Team Discussion: Patient admitted with debility with kidney issues/kidney stone, nephrostomy tube changed out with stent placement and C-diff. Patient having issues with inability to void on his own; required I+O caths that are limited by hematuria, questionable false passage even with coude' catheter use. Functional level fluctuates with fatigue and limited quad extension and knee pain.    Patient on target to meet rehab goals:  Currently needs mod - max assist for mobility in the parallel bars. Working on Investment banker, operational using a Sara +. Working on Teacher, early years/pre with the wife and providing hands on practice for home.   *See Care Plan and progress notes for long and short-term goals.   Revisions to Treatment Plan:  Indwelling foley placement pending   Teaching Needs: Safety, medications, skin care, foley care/ peri-care, transfers, toileting, etc.   Current Barriers to Discharge: Decreased caregiver support and Home enviroment access/layout  Possible Resolutions to Barriers: Family education HH follow up services DME: hospital bed power  wheelchair/hitch lift     Medical Summary Current Status: Debility, Urinary retention, Cdiff, AKI/CKD  Barriers to Discharge: Medical stability;Self-care education;Renal Insufficiency/Failure;Hypotension  Barriers to Discharge Comments: Debility, Urinary retention, Cdiff, AKI/CKD Possible Resolutions to Barriers/Weekly Focus: Will start foley, monitor bowel function, consider retreat Cdiff   Continued Need for Acute Rehabilitation Level of Care: The patient requires daily medical management by a physician with specialized training in physical medicine and rehabilitation for the following reasons: Direction of a multidisciplinary physical rehabilitation program to maximize functional independence : Yes Medical management of patient stability for increased activity during participation in an intensive rehabilitation regime.: Yes Analysis of laboratory values and/or radiology reports with any subsequent need for medication adjustment and/or medical intervention. : Yes   I attest that I was present, lead the team conference, and concur with the assessment and plan of the team.   Chana Bode B 07/29/2023, 3:56 PM

## 2023-07-29 NOTE — Progress Notes (Signed)
 Occupational Therapy Session Note  Patient Details  Name: Calvin Schultz MRN: [id number] Date of Birth: [birth year]  Today's Date: 07/29/2023 OT Individual Time: 1052-1202 OT Individual Time Calculation (min): 70 min    Short Term Goals: Week 3:  OT Short Term Goal 1 (Week 3): Pt will consistently demonstrate lateral scoot transfer at Min A with use of SB when transferring from surface to surface. OT Short Term Goal 2 (Week 3): Pt will complete toileting with Mod A while utilizing drop arm BSC.  Skilled Therapeutic Interventions/Progress Updates:    Pt received sitting in the power w/c with 9/10 pain in his B knees, no request for intervention other than limiting standing this session. Wife present throughout session. He was able to navigate power w/c to the therapy gym with (S). He set up for slideboard transfer to the mat with (S). His wife placed slideboard with min A from OT d/t ROHO making placement difficult. He completed transfer with mod A d/t fatigue. He sat EOM with focus on LUE AROM. Used beasy slideboard to provide gravity reduced shoulder flexion. Blocked practice, high repetitions in flexion, abduction, and scaption. He completed closed chain BUE functional reaching in supported recline. Also used this position to provide gentle stretch to thoracic and cervical spine. He completed slideboard transfer back to the power w/c with min A. He was able to navigate the w/c back to his room. He was left sitting up with all needs met.   Therapy Documentation Precautions:  Precautions Precautions: Fall Precaution/Restrictions Comments: L nephrostomy tube Restrictions Weight Bearing Restrictions Per Provider Order: No  Therapy/Group: Individual Therapy  Crissie Reese 07/29/2023, 11:21 AM

## 2023-07-29 NOTE — Progress Notes (Addendum)
 Nutrition Follow-up  DOCUMENTATION CODES:   Obesity unspecified  INTERVENTION:  Continue carb modified diet with 2 L fluid restriction. Wife to continue to bring in meals for patient.    Continue Magic cup TID with meals, each supplement provides 290 kcal and 9 grams of protein.   Continue renal MVI daily.  NUTRITION DIAGNOSIS:   Increased nutrient needs related to acute illness (AKI superimposed on CKD IV) as evidenced by estimated needs. *continues  GOAL:   Patient will meet greater than or equal to 90% of their needs *improving   MONITOR:   PO intake, Weight trends, Supplement acceptance  REASON FOR ASSESSMENT:   Consult Poor PO  ASSESSMENT:   73 yo male admitted with functional deficits and debility r/t septic shock, C difficile colitis, AKI superimposed on CKD stage IV. PMH includes DM, HTN, arthritis, HLD, CKD, nephrostomy tube placement 06/03/23.  RD met with pt and wife in room. Wife continues to bring in every meal from home. Wife reports usually 2 meals scheduled around therapy schedules. The first meal is usually in the morning around 10a and the 2nd meal around 5pm. Pt reports having at least 2 snacks daily which are observed in room. Pt also reports completing the 3 magic cup daily sent on food service trays but does not usually eat anything else on trays. Pt and wife had questions about d/c diet. Anticipated d/c on 4/4.  24h Recall (3/25): 10am: egg, cheese and ham biscuit Snack: graham crackers, sprite 5pm: 2 chicken legs, cabbage and pinto beans 8pm: belvita bar or peanut butter crackers 3 Magic cup daily on trays.  And sips on water or powerade zero throughout day.    Intake/Output Summary (Last 24 hours) at 07/29/2023 1007 Last data filed at 07/29/2023 0630 Gross per 24 hour  Intake 240 ml  Output 1600 ml  Net -1360 ml   Medications reviewed and include: Rena-vite, Flomax  Labs reviewed. Weights reviewed. Admit weight 107.8 kg. Current weight  103.2 kg.  Diet Order:   Diet Order             Diet renal/carb modified with fluid restriction Diet-HS Snack? Nothing; Fluid restriction: 1200 mL Fluid; Room service appropriate? Yes; Fluid consistency: Thin  Diet effective now                   EDUCATION NEEDS:   Not appropriate for education at this time  Skin:  Skin Assessment: Skin Integrity Issues: Skin Integrity Issues:: Stage II, Incisions Stage II: left & right buttocks Incisions: coccyx  Last BM:  3/18 type 6  Height:   Ht Readings from Last 1 Encounters:  07/10/23 5\' 9"  (1.753 m)    Weight:   Wt Readings from Last 1 Encounters:  07/29/23 103.2 kg    Ideal Body Weight:  72.7 kg  BMI:  Body mass index is 33.6 kg/m.  Estimated Nutritional Needs:   Kcal:  2200-2400  Protein:  110-120 g  Fluid:  >/= 2.2 L  Kathrynn Speed, MPH, RD, LDN Clinical Dietitian Contact information can be found at [facility].

## 2023-07-30 LAB — BASIC METABOLIC PANEL WITH GFR
Anion gap: 8 (ref 5–15)
BUN: 51 mg/dL — ABNORMAL HIGH (ref 8–23)
CO2: 16 mmol/L — ABNORMAL LOW (ref 22–32)
Calcium: 8.4 mg/dL — ABNORMAL LOW (ref 8.9–10.3)
Chloride: 111 mmol/L (ref 98–111)
Creatinine, Ser: 3.65 mg/dL — ABNORMAL HIGH (ref 0.61–1.24)
GFR, Estimated: 17 mL/min — ABNORMAL LOW (ref >60)
Glucose, Bld: 104 mg/dL — ABNORMAL HIGH (ref 70–99)
Potassium: 4.2 mmol/L (ref 3.5–5.1)
Sodium: 135 mmol/L (ref 135–145)

## 2023-07-30 LAB — CBC
HCT: 25.2 % — ABNORMAL LOW (ref 39.0–52.0)
Hemoglobin: 7.7 g/dL — ABNORMAL LOW (ref 13.0–17.0)
MCH: 27.2 pg (ref 26.0–34.0)
MCHC: 30.6 g/dL (ref 30.0–36.0)
MCV: 89 fL (ref 80.0–100.0)
Platelets: 331 10*3/uL (ref 150–400)
RBC: 2.83 MIL/uL — ABNORMAL LOW (ref 4.22–5.81)
RDW: 16.2 % — ABNORMAL HIGH (ref 11.5–15.5)
WBC: 17.9 10*3/uL — ABNORMAL HIGH (ref 4.0–10.5)
nRBC: 0 % (ref 0.0–0.2)

## 2023-07-30 NOTE — Progress Notes (Signed)
 Occupational Therapy Session Note  Patient Details  Name: Calvin Schultz MRN: [id number] Date of Birth: 1950-11-16  Today's Date: 07/30/2023 OT Individual Time: 0830-0930 OT Individual Time Calculation (min): 60 min    Short Term Goals: Week 3:  OT Short Term Goal 1 (Week 3): Pt will consistently demonstrate lateral scoot transfer at Min A with use of SB when transferring from surface to surface. OT Short Term Goal 2 (Week 3): Pt will complete toileting with Mod A while utilizing drop arm BSC. Week 4:     Skilled Therapeutic Interventions/Progress Updates:    1:1 Pt received in the bed. Pt able to come to EOB with min A with the bed feature and bed rail with extra time. Performed bathing at shower level today from elevated wide BSC in sitting position. Used the SARA plus to assist with sit to stand and transfer via lift into the shower to [facility]. Pt able to shower all parts except for peri area, buttocks and feet (pt able to reach lower legs but not scrub feet efficiently.  Pt transferred out of shower with the SARA to the power chair with focus on maintaining upright position with trunk and UE support. Pt was able to thread and unthread his pants with reacher with extra time. Total A for shoes and compression hose. Pt with ongoing swelling in lower Les. Attempted twice to stand from elevated seat power chair but unable to fully come upright. Switched to using a the end of bed frame as a grab bar and pt was able to come into standing with mod A +2 and maintain for donning brief and pulling up pants.  PT able to perform two stands with this method. Pt does still require cues for safe use of power chair features. Pt wanted to tilt himself and pt ended up reclining it and sliding forward. Assisted pt into safe position and setup with his needs at his side.   Therapy Documentation Precautions:  Precautions Precautions: Fall Precaution/Restrictions Comments: L nephrostomy tube Restrictions Weight  Bearing Restrictions Per Provider Order: No  Pain: Pain Assessment Pain Scale: 0-10 Pain Score: 9  Pain Location: Knee Pain Intervention(s): Pain med given for lower pain score than stated, per patient request; applied cream and knee sleeves and allowed rest breaks as needed   Therapy/Group: Individual Therapy  Roney Mans [facility] 07/30/2023, 9:45 AM

## 2023-07-30 NOTE — Progress Notes (Signed)
 PROGRESS NOTE   Subjective/Complaints: Only 1 BM last 24 h recorded. Urine no longer recorded as bloody with ICs, reports these are going well.   ROS: Patient denies fever, new vision changes, dizziness, nausea, vomiting, shortness of breath or chest pain, headache, or mood change. +diarrhea- improved +urinary retention- continued Knee pain- chronic    Objective:   No results found.   Recent Labs    07/30/23 0533  WBC 17.9*  HGB 7.7*  HCT 25.2*  PLT 331    Recent Labs    07/30/23 0533  NA 135  K 4.2  CL 111  CO2 16*  GLUCOSE 104*  BUN 51*  CREATININE 3.65*  CALCIUM 8.4*     Intake/Output Summary (Last 24 hours) at 07/30/2023 1023 Last data filed at 07/30/2023 1610 Gross per 24 hour  Intake 717 ml  Output 650 ml  Net 67 ml     Pressure Injury 07/10/23 Buttocks Right;Mid;Upper Stage 2 -  Partial thickness loss of dermis presenting as a shallow open injury with a red, pink wound bed without slough. Pink opened stage 2 right upper buttock (Active)  07/10/23 1608  Location: Buttocks  Location Orientation: Right;Mid;Upper  Staging: Stage 2 -  Partial thickness loss of dermis presenting as a shallow open injury with a red, pink wound bed without slough.  Wound Description (Comments): Pink opened stage 2 right upper buttock  Present on Admission: Yes     Pressure Injury 07/10/23 Buttocks Right;Mid;Upper Stage 2 -  Partial thickness loss of dermis presenting as a shallow open injury with a red, pink wound bed without slough. Pink stage 2 directly beside the right upper stage 2 (Active)  07/10/23 1609  Location: Buttocks  Location Orientation: Right;Mid;Upper  Staging: Stage 2 -  Partial thickness loss of dermis presenting as a shallow open injury with a red, pink wound bed without slough.  Wound Description (Comments): Pink stage 2 directly beside the right upper stage 2  Present on Admission: Yes      Pressure Injury 07/10/23 Buttocks Right;Mid;Lower Stage 2 -  Partial thickness loss of dermis presenting as a shallow open injury with a red, pink wound bed without slough. Pink stage 2 to right buttock below the upper stage 2 (Active)  07/10/23 1610  Location: Buttocks  Location Orientation: Right;Mid;Lower  Staging: Stage 2 -  Partial thickness loss of dermis presenting as a shallow open injury with a red, pink wound bed without slough.  Wound Description (Comments): Pink stage 2 to right buttock below the upper stage 2  Present on Admission: Yes     Pressure Injury 07/10/23 Buttocks Left;Mid;Lower Stage 2 -  Partial thickness loss of dermis presenting as a shallow open injury with a red, pink wound bed without slough. Pink stage 2 to left buttock (Active)  07/10/23 1611  Location: Buttocks  Location Orientation: Left;Mid;Lower  Staging: Stage 2 -  Partial thickness loss of dermis presenting as a shallow open injury with a red, pink wound bed without slough.  Wound Description (Comments): Pink stage 2 to left buttock  Present on Admission: Yes    Physical Exam: Vital Signs Blood pressure 120/72, pulse 96, temperature 97.9 F (  36.6 C), resp. rate 16, height 5\' 9"  (1.753 m), weight 104.2 kg, SpO2 100%.  Constitutional: No distress . Vital signs reviewed. Sitting in [facility] working with therapy HEENT: NCAT, EOMI, oral membranes moist Neck: supple Cardiovascular: RRR without murmur. No JVD    Respiratory/Chest: CTA Bilaterally without wheezes or rales. Normal effort    GI/Abdomen: BS +, non-tender, non-distended Ext: no clubbing, cyanosis, 2+ bilateral LE edema Psych: pleasant and cooperative Uro: Foley has been discontinued Skin: No evidence of breakdown, no evidence of rash Neurologic: Cranial nerves II through XII grossly intact, motor strength is 4/5 in rght 3- left  deltoid, bicep, tricep, grip,3- RIght 4- left  hip flexor, knee extensors, 4/4 B ankle dorsiflexor and plantar flexor.   C/W today's exam 07/30/2023.    -Buttocks MASD--not examined today     MSK: Bilateral knee effusions ongoing Left shoulder tight in ER/IR/F/E            Assessment/Plan: 1. Functional deficits which require 3+ hours per day of interdisciplinary therapy in a comprehensive inpatient rehab setting. Physiatrist is providing close team supervision and 24 hour management of active medical problems listed below. Physiatrist and rehab team continue to assess barriers to discharge/monitor patient progress toward functional and medical goals  Care Tool:  Bathing  Bathing activity did not occur:  (patient completed a simulated task at bed LOF) Body parts bathed by patient: Right arm, Left arm, Chest, Abdomen, Front perineal area, Right upper leg, Left upper leg, Face   Body parts bathed by helper: Buttocks, Left lower leg, Right lower leg     Bathing assist Assist Level: Maximal Assistance - Patient 24 - 49%     Upper Body Dressing/Undressing Upper body dressing   What is the patient wearing?: Pull over shirt    Upper body assist Assist Level: Contact Guard/Touching assist    Lower Body Dressing/Undressing Lower body dressing      What is the patient wearing?: Pants, Underwear/pull up, Incontinence brief     Lower body assist Assist for lower body dressing: Maximal Assistance - Patient 25 - 49%     Toileting Toileting    Toileting assist Assist for toileting: 2 Helpers     Transfers Chair/bed transfer  Transfers assist  Chair/bed transfer activity did not occur: Safety/medical concerns (not safe to get up)  Chair/bed transfer assist level: Maximal Assistance - Patient 25 - 49%     Locomotion Ambulation   Ambulation assist   Ambulation activity did not occur: Safety/medical concerns  Assist level: Dependent - Patient 0% Assistive device: Other (comment) (sara plus) Max distance: 18 steps   Walk 10 feet activity   Assist  Walk 10 feet activity did not  occur: Safety/medical concerns        Walk 50 feet activity   Assist Walk 50 feet with 2 turns activity did not occur: Safety/medical concerns         Walk 150 feet activity   Assist Walk 150 feet activity did not occur: Safety/medical concerns         Walk 10 feet on uneven surface  activity   Assist Walk 10 feet on uneven surfaces activity did not occur: Safety/medical concerns         Wheelchair     Assist Is the patient using a wheelchair?: Yes Type of Wheelchair: Power    Wheelchair assist level: Supervision/Verbal cueing Max wheelchair distance: 150'    Wheelchair 50 feet with 2 turns activity    Assist  Assist Level: Supervision/Verbal cueing   Wheelchair 150 feet activity     Assist      Assist Level: Supervision/Verbal cueing   Blood pressure 120/72, pulse 96, temperature 97.9 F (36.6 C), resp. rate 16, height 5\' 9"  (1.753 m), weight 104.2 kg, SpO2 100%.   Medical Problem List and Plan: 1. Functional deficits secondary to debility related to septic shock/C. difficile colitis/AKI superimposed on CKD stage IV             -patient may shower with tubes/drains covered             -ELOS/Goals:  3/28, min assist PT/OT             -Continue CIR therapies including PT, OT   -Expected discharge 4/4  2.  Antithrombotics: -DVT/anticoagulation:  Pharmaceutical: Eliquis             -antiplatelet therapy: N/A   3. Pain Management: tramadol prn, Zanaflex 4 mg twice daily as needed   Biliateral OA of knees  -ordered bilateral neoprine knee sleeves --try today  -voltaren gel tid to knees  -have scheduled tramadol 50mg  at 0700 and 1200 daily. Continue prn as well  Mild adhesive capsulitis left shoulder---  sports cream. Can use voltaren gel also. Aggressive ROM with therapy and while in bed--continue 4. Mood/Behavior/Sleep: Trazodone 50 mg nightly as needed.  Provide emotional support             -antipsychotic agents: N/A   5.  Neuropsych/cognition: This patient is capable of making decisions on his own behalf.   6. Skin/Wound Care: Routine skin checks   7. Fluids/Electrolytes/Nutrition:    -3/14 po intake remains poor, albumin low, weights falling  -will ask RD for assistance. Have spoken to patient about intake  -?IV albumin (see below)  3/18 discussed with dietitian, patient recorded is eating 0% of his meals.  Appears that wife bringing in 3 meals a day and he is eating about 75%, appears to have fairly adequate intake.  3/27 eating frequent outside foods-doesn't like hospital food as much 8.  Oliguric AKI superimposed on CKD stage IV.  Baseline creatinine 4.9.  CRRT discontinued 2/25.  No current plan for long-term dialysis.  Follow-up renal services              - Per discussion with nephrology Dr. Verna Czech, keep Foley catheter in to allow accurate I's and O's through Monday; then, can remove and do DC Foley trial while continuing to document strict outputs             - Continue daily assessments of renal function per nephrology   3/8- per renal, Cr leveling out- and con't Foley- will remove Monday  -nephrology following, Cr remains in 4's.   3/14 renal function with some improvement today     -suspect lower extremity edema is related to renal function/low albumin   Appreciate nephro input   -3/17 BUN/creatinine slightly improved 34/3.31, appreciate nephrology assistance  3/19 BUN/CR stable at 34/3.24, nephrology signing off, ok to check BMET twice a week  3/23 encouraging po this weekend. happy that he's eating food from home  3/24 BUN/CR overall stable at 40/3.26  [facility] tomorrow 9.  C. difficile colitis.  Completed course of oral vancomycin 3/7.  DC'd IV Flagyl 3/3. Enteric precautions   3/14 stools formed this morning   3/17 discussed with ID pharmacy, patient having more frequent bowel movements.  Monitor today and if continues consider Dificid treatment  3/18 diarrhea appears  to be improving,  continue to monitor for now  3/19 Frequent Bms improved, continue to follow  3/21 having more frequent bowel movements again today, continue to monitor and if frequent diarrhea persists this weekend consider treatment with Dificid  3/23- mushy/lqiuid stool again overnight    -no dificid at this point. Discuss with GI first on Monday    -added fiber to regimen yesterday, will increase to bid today  3/24 mushy/ liquid BMs, about 2 a day yesterday, more other days- will discuss GI  3/25 GI recommended repeat C. difficile screening. Discussed with Jerolyn Center, hold off on repeat screening test, he only had about 2 bowel movements a day but if continues to have frequent BMs consider retreatment with oral vancomycin for 10 days  3/26 pt reports Bms more firm, continue to monitor   3/27 improved today, continue to monitor  10.  Status post left nephrostomy tube d/t staghorn calculi. Has Foley Nephrostomy tube placed 1/29 per IR with plan for ureteric stent placement 3/21              - Careful monitoring of nephrostomy output  -3/14 flomax has been added to improve emptying  -3/21 patient scheduled today for cystoscopy with left retrograde pyelogram, left ureteroscopy laser lithotripsy and left ureteral stent placement.     3/22- procedure appears successful thus far.    -urine clear, patient comfortable   -foley to come out on Monday per urology  3/25 Foley was removed patient continues to have urinary retention requiring IC.  Increase Flomax to 0.8 mg  3/26 intermittent blood noted with IC, if continues will restart foley  3/27 Urine no longer blood tinged, continue current regimen, consider restart foley if reoccurs 11.  Chronic atrial fibrillation with episodes of bradycardia.  Followed by cardiology services.  Continue Eliquis.  Avoid AV nodal blocking agents   12.  Diabetes mellitus.  Hemoglobin A1c 6.2.    -tightly controlled. Dc SSI and change cbg checks to qam only   CBG (last 3)  No  results for input(s): "GLUCAP" in the last 72 hours.     3/23 good control, po intake sporadic still though 13.  Hypotension.  ProAmatine 10 mg 3 times daily.  Monitor with increased mobility   - Normotensive on admission  3/9- BP running 100-s to 120s systolic- con't regimen  3/19 BP has been on high side, will change midodrine to 5mg  TID PRN orthostatic hypotension  3/27 Controlled, continue to monitor     07/30/2023    6:00 AM 07/30/2023    4:33 AM 07/29/2023    5:47 PM  Vitals with BMI  Weight 229 lbs 12 oz    BMI 33.91    Systolic  120 132  Diastolic  72 80  Pulse  96 96    14.  Chronic anemia.  Niferex daily/Aranesp.  Monitor for any bleeding episodes   3/8- Hb 7.1 today- will order transfusion- transfusion of 1 unit pRBCs.   3/14 hgb up to 9.1  -3/17 hemoglobin down to 8.4, continue to monitor  -3/19 HGB 8.3 today, stable overall, continue to monitor    -3/27 HGB 7.7- a little lower likely due to GU procedure/hematuria, continue to monitor  16. Leukocytosis  3/9- Pt's WBC is 21.7- up from 16k- is afebrile- due to his recent C diff, that just finished correct dosing of ABX for, called ID for guidance on treatment- don't want to cause more Cdiff- ID consulted ---no new orders  3/10  WBC's down to 17k  --have been hovering above and below 20 for awhile. No new clinical symptoms. Nephrostomy drainage clear, yellow  3/14 wbc's down to 13k.--recheck Monday   3/17 WBC is down to 12.4  3/19 WBC 13.4 today, continue to monitor  3/24 WBC higher today 18.7, Discuss C diff treatment with GI a above 3/27 slightly lower at 17.9, appears to be chronically elevated 17. Decreased mood reported by therapy  -Consider SSRI  -3/20 patient reports his mood is okay, denies depression this morning.  Continue to monitor 18. Insomnia   -3/20 schedule trazodone and increase to 75mg  at bedtime  -3/21 insomnia improved but bowel movements at night did wake him up a few times  3/23 sleeping better  when stooling isn't an issue 19. LE edema  -Check albumin/cmp with next labs. Leg elevation and compression    LOS: 20 days A FACE TO FACE EVALUATION WAS PERFORMED  Fanny Dance 07/30/2023, 10:23 AM

## 2023-07-30 NOTE — Progress Notes (Signed)
 Physical Therapy Session Note  Patient Details  Name: Calvin Schultz MRN: [id number] Date of Birth: 25-May-1950  Today's Date: 07/30/2023 PT Individual Time: 5956-3875, 1145-1208, 1430-1535 PT Individual Time Calculation (min): 60 min, 23 min, 65 min   Short Term Goals: Week 3:  PT Short Term Goal 1 (Week 3): Pt will trail slide board transfer to pt car PT Short Term Goal 2 (Week 3): pt will complete custom WC eval PT Short Term Goal 3 (Week 3): pt will perform sit to stand with RW and and mod A or less PT Short Term Goal 4 (Week 3): pt wife will begin hands on family training  Skilled Therapeutic Interventions/Progress Updates:      Treatment Session 1   Pt seated in power WC upon arrival. Pt agreeable to therapy. Pt reports B knee pain at end of session, not quantified, requesting pain medicine, notified nursing.   Pt navigated power WC to main gym and positioned WC next to mat table with supervision, verbal cues provided for positioning at slight angle for safety with slide board.   Pt raised mat table to simulate height of pt ford explorer, with supervision. Without cues pt aimlessly pressing power control buttons. Therapist encouraged pt throughout session to articulate the function/purpose of each button prior to using it for improved purposeful use of controls and pt safety.  Pt utilized elevator function to raise power WC slightly above mat table for downhill slide board transfer. Pt performed slide board transfer with use of feet on foot petals of power WC and min A.   Practiced marching B LE over box to simulate pt lifting LE over theshold for car entry. Pt unable to get adequate clearance actively 2/2 hip flexion weakness, however able to get adequate clearance with therapist providing asssitance. Increased time spent problem solving pt lifting legs into car in tight space with caregiver have decreased access to pt legs. Donned leg lifter to L LE. Pt demos improved clearance and  leverage with use of leg lifter. Pt reports his wife his bringing his chevy impala this afternoon and would like to trail getting into/out of it during PM session. Plan to trail with use of leg lifter. Pt aware he will not be able to attach hitch lift to [employer] for power WC transport.   Pt performed lateral scooting to L with supervision, after pt performed lateral trunk lean B to allow therapist to adjust shorts to reduce friction.   Pt performed lateral trunk lean B for placement of sara plus sling. Pt attempted to ambulate in sara plus with +2 A however unable 2/2 increased B knee pain today. Pt stood and performed hip extension, quad extension x10 holding for 3" while standing in sara plus --pt demos improved activation espiecally of glutes, however unable to achieve full upright positioning. Pt performed 1x10 lateral weight shifts B while standing with sara plus.   Pt performed slide board transfer mat table to power WC at lowest setting with B LE on floor and CGA only to stabilize board, with therpaist placing and removing board.   Pt performed posterior scoot as much as possible with use of B UE on arm rest and verbal cues provided for anterior trunk lean and tricep extension, but required use of tilt function to scoot back.   Therapist utilized leg rest elevation function at end of session for management of B LE edema.   Pt positioned WC next to bed with supervision, verbal cues provided for proper alignement  with backing up to avoid back into bed.   Pt seated in power WC at end of session with all needs within reach and seatbelt on.   Treatment Session 2   Pt seated in power WC upon arrival. Pt agreeable to therapy. Pt denies any pain.   Extensive education provided regarding appropriate use of power controls for pt/caregiver/community safety, to reduce fall risk and reduce pt tipping power WC. Emphasized importance of using controls purposefully versus hapazardly.   Therapist  reviewed each of the control functions, their purpose and appropriate use: elevator, tilt, recline, and leg rest. Therapist recommended pt never use the recliner function (3rd control) as pt inappropriately uses it at times (when intending to use tilt function for pressure relief or scooting back), and is at increased risk of sliding forward. Pt practiced with each of these controls and able to provide teach back of each. Therapist demos improved recall throughout session with repetition (navigating in room, and in [employer], as well as placing WC into full tilt position for pressure relief; will continue to practice and assess in new environments and with distractions.  Pt seated in power WC at end of session with all needs within reach and seatbelt on.   Treatment Session 3   Pt seated in TIS WC upon arrival. Pt agreeable to therapy. Pt reports B Knee pain is manageable.   Pt wife present and reports she took off pt shorts and attempted to donn pt pants while in TIS however unable to get over buttocks. Attemtped to donn in TIS with pt performing lateral trunk lean, however unable to get sufficient clearance.   Pt performed sit to stand with B UE on foot board of bed with max A, while +2 donned pants with total A.   Pt navigated WC out of room, in and out of service and regular elevator, on level and unlevel terrain outside, up and down inclined sidewalk (previously attempted yesterday) and across cross walk at highest speed with supervision, pt requires supervision for safety, especially with distractions/in narrower spaces but pt overall demos improved carry over of management of power controls.  Pt reports yesterday he felt very defeated not being able to get into the car, and he and his wife discussed methods to problem solve.   Pt positioned power WC next to passenger side of chevy impala with assist from therapist for proper positioning.   Pt performed slide board transfer down  hill power WC to car (seat positioned in most forward position with min A, pt required mod A for B LE management (therapist able to lift pt legs into/out of car with pt seat in furthest position and seat reclined.). Pt performed slide board transfer car to power WC with car seat in forward most highest position (once legs out of car) with min A.   Pt very appreciative of ability to get in car. Discussed barriers to performing slide board transfer to [employer] for follow up appointments including:   -Chevy impala inability to transport power WC  -extensive time/energy required to perform transfer  -pt unsafe to perform slide board transfer to transport chair for follow up appointments as therapist concerned pt will slide out of transport chair   Pt and wife dicussed purchasing alterative car options that have bigger openings for driver side car such as ford expedition. Therapist discussed current plan for non emergency transport for transportation home, and pt power WC to be delivered to house on morning of discharge.  Recommended pt purchase vehicle with WC lift for transportation to follow up appointments if pt is able to afford to purchase new vehicle. Pt verbalized understanding and agreeable and plan to look into options upon discharge home.   Pt seated in power WC at end of session with all needs within reach and bed alarm on.     Therapy Documentation Precautions:  Precautions Precautions: Fall Precaution/Restrictions Comments: L nephrostomy tube Restrictions Weight Bearing Restrictions Per Provider Order: No  Therapy/Group: Individual Therapy  [facility], [place], DPT  07/30/2023, 12:14 PM

## 2023-07-30 NOTE — Progress Notes (Signed)
 Patient ID: Calvin Schultz, male   DOB: 06-Dec-1950, 73 y.o.   MRN: [id number]  Met with pt wife coming in later today to try another car transfer with a different car of there's. He is aware of the team conference update and progress this week. He hopes to continue and be able to get into a car due to for his follow up appointments. Will continue to work on discharge needs.

## 2023-07-30 NOTE — Progress Notes (Incomplete)
 Occupational Therapy Session Note  Patient Details  Name: Calvin Schultz MRN: [id number] Date of Birth: 1951/04/16  Session 1: {CHL IP REHAB OT TIME CALCULATIONS:304400400} Session 2:  {CHL IP REHAB OT TIME CALCULATIONS:304400400}  Short Term Goals: Week 3:  OT Short Term Goal 1 (Week 3): Pt will consistently demonstrate lateral scoot transfer at Min A with use of SB when transferring from surface to surface. OT Short Term Goal 2 (Week 3): Pt will complete toileting with Mod A while utilizing drop arm BSC.  Skilled Therapeutic Interventions/Progress Updates:    Session 1: Patient agreeable to participate in OT session. Reports *** pain level.   Patient participated in skilled OT session focusing on ***. Therapist facilitated/assessed/developed/educated/integrated/elicited *** in order to improve/facilitate/promote   Session 2: Patient agreeable to participate in OT session. Reports *** pain level.   Patient participated in skilled OT session focusing on ***. Therapist facilitated/assessed/developed/educated/integrated/elicited *** in order to improve/facilitate/promote    Therapy Documentation Precautions:  Precautions Precautions: Fall Precaution/Restrictions Comments: L nephrostomy tube Restrictions Weight Bearing Restrictions Per Provider Order: No General:   Vital Signs: Therapy Vitals Temp: (!) 96.8 F (36 C) Temp Source: Axillary Pulse Rate: 90 Resp: 18 BP: 123/67 Patient Position (if appropriate): Lying Oxygen Therapy SpO2: 100 % O2 Device: Room Air Pain:   ADL: ADL Eating: Set up Where Assessed-Eating: Bed level Grooming: Setup Where Assessed-Grooming: Edge of bed Upper Body Bathing: Minimal assistance Lower Body Bathing: Dependent (secondary to joint constriction associated with arthritis) Where Assessed-Lower Body Bathing: Bed level Upper Body Dressing: Minimal assistance (Secondary to challenges with AROM for BUE coordination) Where Assessed-Upper  Body Dressing: Bed level Lower Body Dressing: Dependent Where Assessed-Lower Body Dressing: Bed level Toileting: Dependent Where Assessed-Toileting: Bed level Toilet Transfer: Dependent (challenges with weight bearing through BLE, primarily his knees) Toilet Transfer Method: Other (comment) (Requires the assistance of device) Toilet Transfer Equipment: Other (comment) (didn't occur based on observation of performance with BLE pt presented as a safety risk.) Tub/Shower Transfer: Dependent Tub/Shower Transfer Method:  (didn't occur based on the pt's performance with BLE) Tub/Shower Equipment: Other (comment) (didn't occur secondary to safety concerns with limited mobility of BLE) Film/video editor: Dependent Film/video editor Method: Fish farm manager (based on limitation with BLE) Astronomer: Other (comment) (shower didn't occur, however, based on limited mobility with BLE the pt presents a Dep at this time and would require the Maxi for safe transfer) Vision   Perception    Praxis   Balance   Exercises:   Other Treatments:     Therapy/Group: Individual Therapy  Anyra Kaufman, Charisse March 07/30/2023, 9:33 PM

## 2023-07-31 ENCOUNTER — Inpatient Hospital Stay (HOSPITAL_COMMUNITY)

## 2023-07-31 LAB — GLUCOSE, CAPILLARY: Glucose-Capillary: 102 mg/dL — ABNORMAL HIGH (ref 70–99)

## 2023-07-31 LAB — SYNOVIAL CELL COUNT + DIFF, W/ CRYSTALS
Eosinophils-Synovial: 0 % (ref 0–1)
Lymphocytes-Synovial Fld: 2 % (ref 0–20)
Monocyte-Macrophage-Synovial Fluid: 3 % — ABNORMAL LOW (ref 50–90)
Neutrophil, Synovial: 95 % — ABNORMAL HIGH (ref 0–25)
WBC, Synovial: 78500 /mm3 — ABNORMAL HIGH (ref 0–200)

## 2023-07-31 MED ORDER — BUPIVACAINE HCL (PF) 0.5 % IJ SOLN
10.0000 mL | Freq: Once | INTRAMUSCULAR | Status: AC
Start: 2023-07-31 — End: 2023-07-31
  Administered 2023-07-31: 10 mL
  Filled 2023-07-31: qty 10

## 2023-07-31 MED ORDER — METHYLPREDNISOLONE ACETATE 40 MG/ML IJ SUSP
40.0000 mg | Freq: Once | INTRAMUSCULAR | Status: AC
  Administered 2023-07-31: 40 mg via INTRA_ARTICULAR
  Filled 2023-07-31: qty 1

## 2023-07-31 MED ORDER — CHLORHEXIDINE GLUCONATE CLOTH 2 % EX PADS
6.0000 | MEDICATED_PAD | Freq: Every day | CUTANEOUS | Status: DC
Start: 2023-07-31 — End: 2023-08-02
  Administered 2023-07-31 – 2023-08-02 (×3): 6 via TOPICAL

## 2023-07-31 NOTE — Consult Note (Signed)
 Reason for Consult:Left knee pain Referring Physician: Fanny Dance Time called: 1039 Time at bedside: 1140   Calvin Schultz is an 73 y.o. male.  HPI: Calvin Schultz was admitted to [facility] 3w ago. He's been having knee pain for quite some time but it's inhibiting his progression on rehab. Orthopedic surgery was consulted for knee injection. He has had them done before at [facility] but probably not for 10y.  Past Medical History:  Diagnosis Date   Arthritis    CKD (chronic kidney disease) stage 3, GFR 30-59 ml/min (HCC)    Diabetes mellitus without complication (HCC)    Hyperlipidemia    Hypertension     Past Surgical History:  Procedure Laterality Date   CYSTOSCOPY/URETEROSCOPY/HOLMIUM LASER/STENT PLACEMENT Left 07/24/2023   Procedure: CYSTOSCOPY/URETEROSCOPY/HOLMIUM LASER/ STENT PLACEMENT/REMOVAL OF NEPHROSTOMY TUBE;  Surgeon: Jerilee Field, MD;  Location: [facility];  Service: Urology;  Laterality: Left;  90 MINUTE CASE   IR NEPHROSTOMY PLACEMENT LEFT  06/03/2023   KIDNEY SURGERY      Family History  Problem Relation Age of Onset   Dementia Mother     Social History:  reports that he has never smoked. He has never used smokeless tobacco. He reports that he does not drink alcohol and does not use drugs.  Allergies: No Known Allergies  Medications: I have reviewed the patient's current medications.  Results for orders placed or performed during the hospital encounter of 07/10/23 (from the past 48 hours)  CBC     Status: Abnormal   Collection Time: 07/30/23  5:33 AM  Result Value Ref Range   WBC 17.9 (H) 4.0 - 10.5 K/uL   RBC 2.83 (L) 4.22 - 5.81 MIL/uL   Hemoglobin 7.7 (L) 13.0 - 17.0 g/dL   HCT 16.1 (L) 09.6 - 04.5 %   MCV 89.0 80.0 - 100.0 fL   MCH 27.2 26.0 - 34.0 pg   MCHC 30.6 30.0 - 36.0 g/dL   RDW 40.9 (H) 81.1 - 91.4 %   Platelets 331 150 - 400 K/uL   nRBC 0.0 0.0 - 0.2 %    Comment: Performed at [facility], [address], [place], [place] [address]  Basic  metabolic panel     Status: Abnormal   Collection Time: 07/30/23  5:33 AM  Result Value Ref Range   Sodium 135 135 - 145 mmol/L   Potassium 4.2 3.5 - 5.1 mmol/L   Chloride 111 98 - 111 mmol/L   CO2 16 (L) 22 - 32 mmol/L   Glucose, Bld 104 (H) 70 - 99 mg/dL    Comment: Glucose reference range applies only to samples taken after fasting for at least 8 hours.   BUN 51 (H) 8 - 23 mg/dL   Creatinine, Ser 6.21 (H) 0.61 - 1.24 mg/dL   Calcium 8.4 (L) 8.9 - 10.3 mg/dL   GFR, Estimated 17 (L) >60 mL/min    Comment: (NOTE) Calculated using the CKD-EPI Creatinine Equation (2021)    Anion gap 8 5 - 15    Comment: Performed at [facility], [address] [place], [place] [address]  Glucose, capillary     Status: Abnormal   Collection Time: 07/31/23  9:30 AM  Result Value Ref Range   Glucose-Capillary 102 (H) 70 - 99 mg/dL    Comment: Glucose reference range applies only to samples taken after fasting for at least 8 hours.    No results found.  Review of Systems  HENT:  Negative for ear discharge, ear  pain, hearing loss and tinnitus.   Eyes:  Negative for photophobia and pain.  Respiratory:  Negative for cough and shortness of breath.   Cardiovascular:  Negative for chest pain.  Gastrointestinal:  Negative for abdominal pain, nausea and vomiting.  Genitourinary:  Negative for dysuria, flank pain, frequency and urgency.  Musculoskeletal:  Positive for arthralgias (Bilateral knees). Negative for back pain, myalgias and neck pain.  Neurological:  Negative for dizziness and headaches.  Hematological:  Does not bruise/bleed easily.  Psychiatric/Behavioral:  The patient is not nervous/anxious.    Blood pressure 98/62, pulse (!) 57, temperature 98.1 F (36.7 C), temperature source Oral, resp. rate 18, height 5\' 9"  (1.753 m), weight 103.8 kg, SpO2 99%. Physical Exam Constitutional:      General: He is not in acute distress.    Appearance: He is well-developed. He is not diaphoretic.   HENT:     Head: Normocephalic and atraumatic.  Eyes:     General: No scleral icterus.       Right eye: No discharge.        Left eye: No discharge.     Conjunctiva/sclera: Conjunctivae normal.  Cardiovascular:     Rate and Rhythm: Normal rate and regular rhythm.  Pulmonary:     Effort: Pulmonary effort is normal. No respiratory distress.  Musculoskeletal:     Cervical back: Normal range of motion.     Comments: LLE No traumatic wounds, ecchymosis, or rash  Mild TTP  Mild knee effusion  Knee stable to varus/ valgus and anterior/posterior stress  Sens DPN, SPN, TN intact  Motor EHL, ext, flex, evers 5/5  No significant edema  Skin:    General: Skin is warm and dry.  Neurological:     Mental Status: He is alert.  Psychiatric:        Mood and Affect: Mood normal.        Behavior: Behavior normal.     Assessment/Plan: Left knee pain -- Have aspirated and injected.    Calvin Caldron, PA-C Orthopedic Surgery [phone] 07/31/2023, 2:19 PM

## 2023-07-31 NOTE — Group Note (Signed)
 Patient Details Name: Calvin Schultz MRN: [id number] DOB: 1950/09/16 Today's Date: 07/31/2023  Time Calculation: OT Group Time Calculation OT Group Start Time: 0932 OT Group Stop Time: 1032 OT Group Time Calculation (min): 60 min      Group Description: Stress management: Pt participated in group session with a focus on stress mgmt, education provided on healthy coping strategies, and social interaction. Focus of session on providing coping strategies to manage new diagnosis to allow for improved mental health to increase overall quality of life . Discussed how to break down stressors into "daily hassles," "major life stressors" and "life circumstances" in an effort to allow pts to chunk their stressors into groups and determine where to best put their efforts/time when dealing with stress. Provided active listening, emotional support and therapeutic use of self. Offered education on factors that protect [place] against stress such as "daily uplifts," "healthy coping strategies" and "protective factors." Encouraged all group members to make an effort to actively recall one event from their day that was a daily uplift in an effort to protect their mindset from stressors as well as sharing this information with their caregivers to facilitate improved caregiver communication and decrease overall burden of care.  Issued pt handouts on healthy coping strategies to implement into routine.   Individual level documentation: Patient participated with full collaboration during session.   Pain: No pain reported during session   [name] 07/31/2023, 12:16 PM

## 2023-07-31 NOTE — Procedures (Signed)
 Procedure: Left knee aspiration and injection   Indication: Left knee effusion(s)   Surgeon: Charma Igo, PA-C   Assist: None   Anesthesia: Topical refrigerant   EBL: None   Complications: None   Findings: After risks/benefits explained patient desires to undergo procedure. Consent obtained and time out performed. The left knee was sterilely prepped and aspirated. 18ml opaque yellow fluid obtained. 6ml 0.5% Marcaine and 40mg  depo-medrol instilled. Pt tolerated the procedure well.       Freeman Caldron, PA-C Orthopedic Surgery [phone]

## 2023-07-31 NOTE — Progress Notes (Addendum)
 PROGRESS NOTE   Subjective/Complaints: Patient had a few bowel movements last night, reports they were not liquidy diarrhea and they are getting more formed.  Patient had difficulty with knee pain left greater than right.  Pain did improve with tramadol.  Reports IC has been going fairly well, no further bleeding catheterizations.  ROS: Patient denies fever, new vision changes, dizziness, nausea, vomiting, shortness of breath or chest pain, headache, or mood change. +diarrhea- improved +urinary retention- continued Knee pain- chronic    Objective:   No results found.   Recent Labs    07/30/23 0533  WBC 17.9*  HGB 7.7*  HCT 25.2*  PLT 331    Recent Labs    07/30/23 0533  NA 135  K 4.2  CL 111  CO2 16*  GLUCOSE 104*  BUN 51*  CREATININE 3.65*  CALCIUM 8.4*     Intake/Output Summary (Last 24 hours) at 07/31/2023 1124 Last data filed at 07/31/2023 0230 Gross per 24 hour  Intake 480 ml  Output 550 ml  Net -70 ml     Pressure Injury 07/10/23 Buttocks Right;Mid;Upper Stage 2 -  Partial thickness loss of dermis presenting as a shallow open injury with a red, pink wound bed without slough. Pink opened stage 2 right upper buttock (Active)  07/10/23 1608  Location: Buttocks  Location Orientation: Right;Mid;Upper  Staging: Stage 2 -  Partial thickness loss of dermis presenting as a shallow open injury with a red, pink wound bed without slough.  Wound Description (Comments): Pink opened stage 2 right upper buttock  Present on Admission: Yes     Pressure Injury 07/10/23 Buttocks Right;Mid;Upper Stage 2 -  Partial thickness loss of dermis presenting as a shallow open injury with a red, pink wound bed without slough. Pink stage 2 directly beside the right upper stage 2 (Active)  07/10/23 1609  Location: Buttocks  Location Orientation: Right;Mid;Upper  Staging: Stage 2 -  Partial thickness loss of dermis presenting as  a shallow open injury with a red, pink wound bed without slough.  Wound Description (Comments): Pink stage 2 directly beside the right upper stage 2  Present on Admission: Yes     Pressure Injury 07/10/23 Buttocks Right;Mid;Lower Stage 2 -  Partial thickness loss of dermis presenting as a shallow open injury with a red, pink wound bed without slough. Pink stage 2 to right buttock below the upper stage 2 (Active)  07/10/23 1610  Location: Buttocks  Location Orientation: Right;Mid;Lower  Staging: Stage 2 -  Partial thickness loss of dermis presenting as a shallow open injury with a red, pink wound bed without slough.  Wound Description (Comments): Pink stage 2 to right buttock below the upper stage 2  Present on Admission: Yes     Pressure Injury 07/10/23 Buttocks Left;Mid;Lower Stage 2 -  Partial thickness loss of dermis presenting as a shallow open injury with a red, pink wound bed without slough. Pink stage 2 to left buttock (Active)  07/10/23 1611  Location: Buttocks  Location Orientation: Left;Mid;Lower  Staging: Stage 2 -  Partial thickness loss of dermis presenting as a shallow open injury with a red, pink wound bed without slough.  Wound  Description (Comments): Pink stage 2 to left buttock  Present on Admission: Yes    Physical Exam: Vital Signs Blood pressure 98/62, pulse (!) 57, temperature 98.1 F (36.7 C), temperature source Oral, resp. rate 18, height 5\' 9"  (1.753 m), weight 103.8 kg, SpO2 99%.  Constitutional: No distress . Vital signs reviewed. Sitting in [facility] working with therapy HEENT: NCAT, EOMI, oral membranes moist Neck: supple Cardiovascular: RRR without murmur. No JVD    Respiratory/Chest: CTA Bilaterally without wheezes or rales. Normal effort    GI/Abdomen: BS +, non-tender, non-distended Ext: no clubbing, cyanosis, 2+ bilateral LE edema Bilateral knee TTP and effusions Psych: pleasant and cooperative Skin: No evidence of breakdown, no evidence of  rash Neurologic: Cranial nerves II through XII grossly intact, motor strength is 4/5 in rght 3- left  deltoid, bicep, tricep, grip,3- RIght 4- left  hip flexor, knee extensors, 4/4 B ankle dorsiflexor and plantar flexor.  C/W today's exam 07/31/2023.    -Buttocks MASD--not examined today     MSK: Bilateral knee effusions ongoing Left shoulder tight in ER/IR/F/E            Assessment/Plan: 1. Functional deficits which require 3+ hours per day of interdisciplinary therapy in a comprehensive inpatient rehab setting. Physiatrist is providing close team supervision and 24 hour management of active medical problems listed below. Physiatrist and rehab team continue to assess barriers to discharge/monitor patient progress toward functional and medical goals  Care Tool:  Bathing  Bathing activity did not occur:  (patient completed a simulated task at bed LOF) Body parts bathed by patient: Right arm, Left arm, Chest, Abdomen, Front perineal area, Right upper leg, Left upper leg, Face   Body parts bathed by helper: Buttocks, Left lower leg, Right lower leg     Bathing assist Assist Level: Maximal Assistance - Patient 24 - 49%     Upper Body Dressing/Undressing Upper body dressing   What is the patient wearing?: Pull over shirt    Upper body assist Assist Level: Contact Guard/Touching assist    Lower Body Dressing/Undressing Lower body dressing      What is the patient wearing?: Pants, Underwear/pull up, Incontinence brief     Lower body assist Assist for lower body dressing: Maximal Assistance - Patient 25 - 49%     Toileting Toileting    Toileting assist Assist for toileting: 2 Helpers     Transfers Chair/bed transfer  Transfers assist  Chair/bed transfer activity did not occur: Safety/medical concerns (not safe to get up)  Chair/bed transfer assist level: Maximal Assistance - Patient 25 - 49%     Locomotion Ambulation   Ambulation assist   Ambulation activity  did not occur: Safety/medical concerns  Assist level: Dependent - Patient 0% Assistive device: Other (comment) (sara plus) Max distance: 18 steps   Walk 10 feet activity   Assist  Walk 10 feet activity did not occur: Safety/medical concerns        Walk 50 feet activity   Assist Walk 50 feet with 2 turns activity did not occur: Safety/medical concerns         Walk 150 feet activity   Assist Walk 150 feet activity did not occur: Safety/medical concerns         Walk 10 feet on uneven surface  activity   Assist Walk 10 feet on uneven surfaces activity did not occur: Safety/medical concerns         Wheelchair     Assist Is the patient using a wheelchair?:  Yes Type of Wheelchair: Power    Wheelchair assist level: Supervision/Verbal cueing Max wheelchair distance: 150'    Wheelchair 50 feet with 2 turns activity    Assist        Assist Level: Supervision/Verbal cueing   Wheelchair 150 feet activity     Assist      Assist Level: Supervision/Verbal cueing   Blood pressure 98/62, pulse (!) 57, temperature 98.1 F (36.7 C), temperature source Oral, resp. rate 18, height 5\' 9"  (1.753 m), weight 103.8 kg, SpO2 99%.   Medical Problem List and Plan: 1. Functional deficits secondary to debility related to septic shock/C. difficile colitis/AKI superimposed on CKD stage IV             -patient may shower with tubes/drains covered             -ELOS/Goals:  3/28, min assist PT/OT             -Continue CIR therapies including PT, OT   -Expected discharge 4/4  2.  Antithrombotics: -DVT/anticoagulation:  Pharmaceutical: Eliquis             -antiplatelet therapy: N/A   3. Pain Management: tramadol prn, Zanaflex 4 mg twice daily as needed   Biliateral OA of knees  -ordered bilateral neoprine knee sleeves --try today  -voltaren gel tid to knees  -have scheduled tramadol 50mg  at 0700 and 1200 daily. Continue prn as well  Mild adhesive capsulitis  left shoulder---  sports cream. Can use voltaren gel also. Aggressive ROM with therapy and while in bed--continue  -Will check right knee x-ray, left knee x-ray on 3/8 with advanced OA.  Will consult orthopedics for cortisone injections 4. Mood/Behavior/Sleep: Trazodone 50 mg nightly as needed.  Provide emotional support             -antipsychotic agents: N/A   5. Neuropsych/cognition: This patient is capable of making decisions on his own behalf.   6. Skin/Wound Care: Routine skin checks   7. Fluids/Electrolytes/Nutrition:    -3/14 po intake remains poor, albumin low, weights falling  -will ask RD for assistance. Have spoken to patient about intake  -?IV albumin (see below)  3/18 discussed with dietitian, patient recorded is eating 0% of his meals.  Appears that wife bringing in 3 meals a day and he is eating about 75%, appears to have fairly adequate intake.  3/27 eating frequent outside foods-doesn't like hospital food as much 8.  Oliguric AKI superimposed on CKD stage IV.  Baseline creatinine 4.9.  CRRT discontinued 2/25.  No current plan for long-term dialysis.  Follow-up renal services              - Per discussion with nephrology Dr. Verna Czech, keep Foley catheter in to allow accurate I's and O's through Monday; then, can remove and do DC Foley trial while continuing to document strict outputs             - Continue daily assessments of renal function per nephrology   3/8- per renal, Cr leveling out- and con't Foley- will remove Monday  -nephrology following, Cr remains in 4's.   3/14 renal function with some improvement today     -suspect lower extremity edema is related to renal function/low albumin   Appreciate nephro input   -3/17 BUN/creatinine slightly improved 34/3.31, appreciate nephrology assistance  3/19 BUN/CR stable at 34/3.24, nephrology signing off, ok to check BMET twice a week  3/23 encouraging po this weekend. happy that he's eating food from  home  3/24 BUN/CR overall  stable at 40/3.26  -Recheck Monday 9.  C. difficile colitis.  Completed course of oral vancomycin 3/7.  DC'd IV Flagyl 3/3. Enteric precautions   3/14 stools formed this morning   3/17 discussed with ID pharmacy, patient having more frequent bowel movements.  Monitor today and if continues consider Dificid treatment  3/18 diarrhea appears to be improving, continue to monitor for now  3/19 Frequent Bms improved, continue to follow  3/21 having more frequent bowel movements again today, continue to monitor and if frequent diarrhea persists this weekend consider treatment with Dificid  3/23- mushy/lqiuid stool again overnight    -no dificid at this point. Discuss with GI first on Monday    -added fiber to regimen yesterday, will increase to bid today  3/24 mushy/ liquid BMs, about 2 a day yesterday, more other days- will discuss GI  3/25 GI recommended repeat C. difficile screening. Discussed with [facility], hold off on repeat screening test, he only had about 2 bowel movements a day but if continues to have frequent BMs consider retreatment with oral vancomycin for 10 days  3/26 pt reports Bms more firm, continue to monitor   3/28 patient reports BMs have been more solid, continue to monitor.  If worsening diarrhea occurs consider retreatment with vancomycin oral for 10 days 10.  Status post left nephrostomy tube d/t staghorn calculi. Has Foley Nephrostomy tube placed 1/29 per IR with plan for ureteric stent placement 3/21              - Careful monitoring of nephrostomy output  -3/14 flomax has been added to improve emptying  -3/21 patient scheduled today for cystoscopy with left retrograde pyelogram, left ureteroscopy laser lithotripsy and left ureteral stent placement.     3/22- procedure appears successful thus far.    -urine clear, patient comfortable   -foley to come out on Monday per urology  3/25 Foley was removed patient continues to have urinary retention requiring IC.  Increase  Flomax to 0.8 mg  3/26 intermittent blood noted with IC, if continues will restart foley  3/27 Urine no longer blood tinged, continue current regimen, consider restart foley if reoccurs  3/27 continues to require catheterization but no longer blood-tinged Addendum 3/8 foley started after traumatic cath, possible false passage, continue foley for now, f/u with urology outpatient 11.  Chronic atrial fibrillation with episodes of bradycardia.  Followed by cardiology services.  Continue Eliquis.  Avoid AV nodal blocking agents   12.  Diabetes mellitus.  Hemoglobin A1c 6.2.    -tightly controlled. Dc SSI and change cbg checks to qam only   CBG (last 3)  Recent Labs    07/31/23 0930  GLUCAP 102*       3/23 good control, po intake sporadic still though 13.  Hypotension.  ProAmatine 10 mg 3 times daily.  Monitor with increased mobility   - Normotensive on admission  3/9- BP running 100-s to 120s systolic- con't regimen  3/19 BP has been on high side, will change midodrine to 5mg  TID PRN orthostatic hypotension  3/28 BP stable, continue current regimen     07/31/2023    3:31 AM 07/31/2023    2:51 AM 07/30/2023    6:45 PM  Vitals with BMI  Weight 228 lbs 13 oz    BMI 33.78    Systolic  98 123  Diastolic  62 67  Pulse  57 90    14.  Chronic anemia.  Niferex  daily/Aranesp.  Monitor for any bleeding episodes   3/8- Hb 7.1 today- will order transfusion- transfusion of 1 unit pRBCs.   3/14 hgb up to 9.1  -3/17 hemoglobin down to 8.4, continue to monitor  -3/19 HGB 8.3 today, stable overall, continue to monitor    -3/27 HGB 7.7- a little lower likely due to GU procedure/hematuria, continue to monitor  16. Leukocytosis  3/9- Pt's WBC is 21.7- up from 16k- is afebrile- due to his recent C diff, that just finished correct dosing of ABX for, called ID for guidance on treatment- don't want to cause more Cdiff- ID consulted ---no new orders  3/10 WBC's down to 17k  --have been hovering above and  below 20 for awhile. No new clinical symptoms. Nephrostomy drainage clear, yellow  3/14 wbc's down to 13k.--recheck Monday   3/17 WBC is down to 12.4  3/19 WBC 13.4 today, continue to monitor  3/24 WBC higher today 18.7, Discuss C diff treatment with GI a above 3/27 slightly lower at 17.9, appears to be chronically elevated Recheck monday 17. Decreased mood reported by therapy  -Consider SSRI  -3/20 patient reports his mood is okay, denies depression this morning.  Continue to monitor 18. Insomnia   -3/20 schedule trazodone and increase to 75mg  at bedtime  -3/21 insomnia improved but bowel movements at night did wake him up a few times  3/23 sleeping better when stooling isn't an issue 19. LE edema  -Check albumin/cmp with next labs. Leg elevation and compression    LOS: 21 days A FACE TO FACE EVALUATION WAS PERFORMED  Fanny Dance 07/31/2023, 11:24 AM

## 2023-07-31 NOTE — Progress Notes (Signed)
 Nurse attempted to do in and out cath, some bleeding was noted. Wife reported patient may have false passage/empty cavity that sometimes cause bleeding. MD and PA notified.  New orders was given for coude catheter insertion.  Catheter was inserted, patient tolerated well.

## 2023-07-31 NOTE — Progress Notes (Signed)
 Physical Therapy Session Note  Patient Details  Name: Calvin Schultz MRN: [id number] Date of Birth: 01/17/1951  Today's Date: 07/31/2023 PT Individual Time: 1101-1200 PT Individual Time Calculation (min): 59 min   Short Term Goals: Week 3:  PT Short Term Goal 1 (Week 3): Pt will trail slide board transfer to pt car PT Short Term Goal 2 (Week 3): pt will complete custom WC eval PT Short Term Goal 3 (Week 3): pt will perform sit to stand with RW and and mod A or less PT Short Term Goal 4 (Week 3): pt wife will begin hands on family training  Skilled Therapeutic Interventions/Progress Updates: Pt presents sitting in PWC and agreeable to therapy.  Pt able to negotiate w/c out of confined space in room around table and into hallway.  Pt negotiated to 4N tower w/ changes to speed level as appropriate for open vs confined spaces.  Pt able to correctly recline the w/c seat using correct control for pressure relief.  Pt negotiated onto elevator and able to perform multiple point turn to face forward w/o hitting walls.  Pt cued on foot plate movement as well for increased space.  Pt negotiated through gift shop at low speed following directions w/o collisions or close calls.  Pt negotiated outside over uneven surfaces and through parking barricades for slalom course.  Pt returned to elevator and backed in w/ multiple cues for direction.  Pt negotiated into room and around table to back into position next to bed w/o collision.  Pt reclined w/c and remained in PWC w/ seat belt on and all needs in reach.     Therapy Documentation Precautions:  Precautions Precautions: Fall Precaution/Restrictions Comments: L nephrostomy tube Restrictions Weight Bearing Restrictions Per Provider Order: No General:   Vital Signs:   Pain:10/10 Pain Assessment Pain Scale: 0-10 Pain Score: 10-Worst pain ever Pain Location: Knee Pain Intervention(s): Medication (See eMAR)   Therapy/Group: Individual Therapy  Lucio Edward 07/31/2023, 12:09 PM

## 2023-08-01 LAB — GLUCOSE, CAPILLARY: Glucose-Capillary: 201 mg/dL — ABNORMAL HIGH (ref 70–99)

## 2023-08-01 NOTE — Plan of Care (Signed)
  Problem: Consults Goal: RH GENERAL PATIENT EDUCATION Description: See Patient Education module for education specifics. Outcome: Progressing   Problem: RH BOWEL ELIMINATION Goal: RH STG MANAGE BOWEL WITH ASSISTANCE Description: STG Manage Bowel withtoileting Assistance. Outcome: Progressing   Problem: RH BLADDER ELIMINATION Goal: RH STG MANAGE BLADDER WITH ASSISTANCE Description: STG Manage Bladder With equipment/min Assistance Outcome: Progressing   Problem: RH SKIN INTEGRITY Goal: RH STG SKIN FREE OF INFECTION/BREAKDOWN Description: Maintain skin free of infection with min assist Outcome: Progressing Goal: RH STG MAINTAIN SKIN INTEGRITY WITH ASSISTANCE Description: STG Maintain Skin Integrity With min Assistance. Outcome: Progressing   Problem: RH SAFETY Goal: RH STG ADHERE TO SAFETY PRECAUTIONS W/ASSISTANCE/DEVICE Description: STG Adhere to Safety Precautions With cues Assistance/Device. Outcome: Progressing   Problem: RH PAIN MANAGEMENT Goal: RH STG PAIN MANAGED AT OR BELOW PT'S PAIN GOAL Description: < 4 with prns Outcome: Progressing   Problem: RH KNOWLEDGE DEFICIT GENERAL Goal: RH STG INCREASE KNOWLEDGE OF SELF CARE AFTER HOSPITALIZATION Description: Patient and spouse will be able to manage care using educational resources for medications and dietary modification independently Outcome: Progressing

## 2023-08-01 NOTE — Progress Notes (Signed)
 PROGRESS NOTE   Subjective/Complaints: Patient had left knee effusion aspiration and injection yesterday with orthopedics; endorses significant pain relief with this.  Still with significant pain on the right knee.  He states that he is getting a loaner power wheelchair for home and is having a wheelchair ramp put in.  A little frustrated regarding nonambulatory goals, but positive regarding improving strength.  ROS: Patient denies fever, new vision changes, dizziness, nausea, vomiting, shortness of breath or chest pain, headache, or mood change. +diarrhea- improved +urinary retention-patient has Foley Knee pain-left improved, right ongoing    Objective:   DG Knee Complete 4 Views Right Result Date: 07/31/2023 CLINICAL DATA:  Right knee pain. EXAM: RIGHT KNEE - COMPLETE 4+ VIEW COMPARISON:  None Available. FINDINGS: No evidence of fracture or dislocation. Small suprapatellar joint effusion is noted. Severe degenerative changes seen involving the medial joint space. Severe degenerative changes also seen involving patellofemoral space with osteophyte formation. Mild degenerative changes seen involving lateral joint space. Soft tissues are unremarkable. IMPRESSION: Severe tricompartmental degenerative joint disease. Small suprapatellar joint effusion. No fracture or dislocation. Electronically Signed   By: Lupita Raider M.D.   On: 07/31/2023 20:35     Recent Labs    07/30/23 0533  WBC 17.9*  HGB 7.7*  HCT 25.2*  PLT 331    Recent Labs    07/30/23 0533  NA 135  K 4.2  CL 111  CO2 16*  GLUCOSE 104*  BUN 51*  CREATININE 3.65*  CALCIUM 8.4*     Intake/Output Summary (Last 24 hours) at 08/01/2023 0817 Last data filed at 08/01/2023 0408 Gross per 24 hour  Intake 200 ml  Output 775 ml  Net -575 ml     Pressure Injury 07/10/23 Buttocks Right;Mid;Upper Stage 2 -  Partial thickness loss of dermis presenting as a shallow  open injury with a red, pink wound bed without slough. Pink opened stage 2 right upper buttock (Active)  07/10/23 1608  Location: Buttocks  Location Orientation: Right;Mid;Upper  Staging: Stage 2 -  Partial thickness loss of dermis presenting as a shallow open injury with a red, pink wound bed without slough.  Wound Description (Comments): Pink opened stage 2 right upper buttock  Present on Admission: Yes     Pressure Injury 07/10/23 Buttocks Right;Mid;Upper Stage 2 -  Partial thickness loss of dermis presenting as a shallow open injury with a red, pink wound bed without slough. Pink stage 2 directly beside the right upper stage 2 (Active)  07/10/23 1609  Location: Buttocks  Location Orientation: Right;Mid;Upper  Staging: Stage 2 -  Partial thickness loss of dermis presenting as a shallow open injury with a red, pink wound bed without slough.  Wound Description (Comments): Pink stage 2 directly beside the right upper stage 2  Present on Admission: Yes     Pressure Injury 07/10/23 Buttocks Right;Mid;Lower Stage 2 -  Partial thickness loss of dermis presenting as a shallow open injury with a red, pink wound bed without slough. Pink stage 2 to right buttock below the upper stage 2 (Active)  07/10/23 1610  Location: Buttocks  Location Orientation: Right;Mid;Lower  Staging: Stage 2 -  Partial thickness loss  of dermis presenting as a shallow open injury with a red, pink wound bed without slough.  Wound Description (Comments): Pink stage 2 to right buttock below the upper stage 2  Present on Admission: Yes     Pressure Injury 07/10/23 Buttocks Left;Mid;Lower Stage 2 -  Partial thickness loss of dermis presenting as a shallow open injury with a red, pink wound bed without slough. Pink stage 2 to left buttock (Active)  07/10/23 1611  Location: Buttocks  Location Orientation: Left;Mid;Lower  Staging: Stage 2 -  Partial thickness loss of dermis presenting as a shallow open injury with a red, pink  wound bed without slough.  Wound Description (Comments): Pink stage 2 to left buttock  Present on Admission: Yes    Physical Exam: Vital Signs Blood pressure (!) 141/67, pulse 60, temperature 97.9 F (36.6 C), resp. rate 17, height 5\' 9"  (1.753 m), weight 105 kg, SpO2 100%.  Constitutional: No distress . Vital signs reviewed.  Sitting up in wheelchair at bedside. HEENT: NCAT, EOMI, oral membranes moist Neck: supple Cardiovascular: RRR without murmur. No JVD    Respiratory/Chest: CTA Bilaterally without wheezes or rales. Normal effort    GI/Abdomen: BS +, non-tender, non-distended Ext: no clubbing, cyanosis, 2+ right lower extremity edema with joint effusions at the knee, 1+ left lower extremity.  Psych: pleasant and cooperative Skin: No evidence of breakdown, no evidence of rash Neurologic: Cranial nerves II through XII grossly intact, motor strength is 4/5 in rght 3- left  deltoid, bicep, tricep, grip,3- RIght 4- left  hip flexor, knee extensors, 4/4 B ankle dorsiflexor and plantar flexor.  C/W today's exam 08/01/2023.    -Buttocks MASD--not examined today     MSK: Bilateral knee effusions ongoing Left shoulder tight in ER/IR/F/E        Unchanged 3-29    Assessment/Plan: 1. Functional deficits which require 3+ hours per day of interdisciplinary therapy in a comprehensive inpatient rehab setting. Physiatrist is providing close team supervision and 24 hour management of active medical problems listed below. Physiatrist and rehab team continue to assess barriers to discharge/monitor patient progress toward functional and medical goals  Care Tool:  Bathing  Bathing activity did not occur:  (patient completed a simulated task at bed LOF) Body parts bathed by patient: Right arm, Left arm, Chest, Abdomen, Front perineal area, Right upper leg, Left upper leg, Face   Body parts bathed by helper: Buttocks, Left lower leg, Right lower leg     Bathing assist Assist Level: Maximal  Assistance - Patient 24 - 49%     Upper Body Dressing/Undressing Upper body dressing   What is the patient wearing?: Pull over shirt    Upper body assist Assist Level: Contact Guard/Touching assist    Lower Body Dressing/Undressing Lower body dressing      What is the patient wearing?: Pants, Underwear/pull up, Incontinence brief     Lower body assist Assist for lower body dressing: Maximal Assistance - Patient 25 - 49%     Toileting Toileting    Toileting assist Assist for toileting: 2 Helpers     Transfers Chair/bed transfer  Transfers assist  Chair/bed transfer activity did not occur: Safety/medical concerns (not safe to get up)  Chair/bed transfer assist level: Maximal Assistance - Patient 25 - 49%     Locomotion Ambulation   Ambulation assist   Ambulation activity did not occur: Safety/medical concerns  Assist level: Dependent - Patient 0% Assistive device: Other (comment) (sara plus) Max distance: 18 steps  Walk 10 feet activity   Assist  Walk 10 feet activity did not occur: Safety/medical concerns        Walk 50 feet activity   Assist Walk 50 feet with 2 turns activity did not occur: Safety/medical concerns         Walk 150 feet activity   Assist Walk 150 feet activity did not occur: Safety/medical concerns         Walk 10 feet on uneven surface  activity   Assist Walk 10 feet on uneven surfaces activity did not occur: Safety/medical concerns         Wheelchair     Assist Is the patient using a wheelchair?: Yes Type of Wheelchair: Power    Wheelchair assist level: Supervision/Verbal cueing Max wheelchair distance: 300+    Wheelchair 50 feet with 2 turns activity    Assist        Assist Level: Supervision/Verbal cueing   Wheelchair 150 feet activity     Assist      Assist Level: Supervision/Verbal cueing   Blood pressure (!) 141/67, pulse 60, temperature 97.9 F (36.6 C), resp. rate 17,  height 5\' 9"  (1.753 m), weight 105 kg, SpO2 100%.   Medical Problem List and Plan: 1. Functional deficits secondary to debility related to septic shock/C. difficile colitis/AKI superimposed on CKD stage IV             -patient may shower with tubes/drains covered             -ELOS/Goals:  3/28, min assist PT/OT             -Continue CIR therapies including PT, OT   -Expected discharge 4/4  2.  Antithrombotics: -DVT/anticoagulation:  Pharmaceutical: Eliquis             -antiplatelet therapy: N/A   3. Pain Management: tramadol prn, Zanaflex 4 mg twice daily as needed   Biliateral OA of knees  -ordered bilateral neoprine knee sleeves --try today  -voltaren gel tid to knees  -have scheduled tramadol 50mg  at 0700 and 1200 daily. Continue prn as well  Mild adhesive capsulitis left shoulder---  sports cream. Can use voltaren gel also. Aggressive ROM with therapy and while in bed--continue  -Will check right knee x-ray, left knee x-ray on 3/8 with advanced OA.  Will consult orthopedics for cortisone injections  -3-28: Orthopedics aspirated and injected left knee, with improvement.  Ongoing pain with right knee.  4. Mood/Behavior/Sleep: Trazodone 50 mg nightly as needed.  Provide emotional support             -antipsychotic agents: N/A   5. Neuropsych/cognition: This patient is capable of making decisions on his own behalf.   6. Skin/Wound Care: Routine skin checks   7. Fluids/Electrolytes/Nutrition:    -3/14 po intake remains poor, albumin low, weights falling  -will ask RD for assistance. Have spoken to patient about intake  -?IV albumin (see below)  3/18 discussed with dietitian, patient recorded is eating 0% of his meals.  Appears that wife bringing in 3 meals a day and he is eating about 75%, appears to have fairly adequate intake.  3/27 eating frequent outside foods-doesn't like hospital food as much  8.  Oliguric AKI superimposed on CKD stage IV.  Baseline creatinine 4.9.  CRRT  discontinued 2/25.  No current plan for long-term dialysis.  Follow-up renal services              - Per discussion with nephrology Dr. Verna Czech,  keep Foley catheter in to allow accurate I's and O's through Monday; then, can remove and do DC Foley trial while continuing to document strict outputs             - Continue daily assessments of renal function per nephrology   3/8- per renal, Cr leveling out- and con't Foley- will remove Monday  -nephrology following, Cr remains in 4's.   3/14 renal function with some improvement today     -suspect lower extremity edema is related to renal function/low albumin   Appreciate nephro input   -3/17 BUN/creatinine slightly improved 34/3.31, appreciate nephrology assistance  3/19 BUN/CR stable at 34/3.24, nephrology signing off, ok to check BMET twice a week  3/23 encouraging po this weekend. happy that he's eating food from home  3/24 BUN/CR overall stable at 40/3.26  -Recheck Monday  9.  C. difficile colitis.  Completed course of oral vancomycin 3/7.  DC'd IV Flagyl 3/3. Enteric precautions   3/14 stools formed this morning   3/17 discussed with ID pharmacy, patient having more frequent bowel movements.  Monitor today and if continues consider Dificid treatment  3/18 diarrhea appears to be improving, continue to monitor for now  3/19 Frequent Bms improved, continue to follow  3/21 having more frequent bowel movements again today, continue to monitor and if frequent diarrhea persists this weekend consider treatment with Dificid  3/23- mushy/lqiuid stool again overnight    -no dificid at this point. Discuss with GI first on Monday    -added fiber to regimen yesterday, will increase to bid today  3/24 mushy/ liquid BMs, about 2 a day yesterday, more other days- will discuss GI  3/25 GI recommended repeat C. difficile screening. Discussed with [facility], hold off on repeat screening test, he only had about 2 bowel movements a day but if continues to  have frequent BMs consider retreatment with oral vancomycin for 10 days  3/26 pt reports Bms more firm, continue to monitor   3/28 patient reports BMs have been more solid, continue to monitor.  If worsening diarrhea occurs consider retreatment with vancomycin oral for 10 days  3-29: Diarrhea this a.m., no bowel movements this afternoon recorded  10.  Status post left nephrostomy tube d/t staghorn calculi. Has Foley Nephrostomy tube placed 1/29 per IR with plan for ureteric stent placement 3/21              - Careful monitoring of nephrostomy output  -3/14 flomax has been added to improve emptying  -3/21 patient scheduled today for cystoscopy with left retrograde pyelogram, left ureteroscopy laser lithotripsy and left ureteral stent placement.     3/22- procedure appears successful thus far.    -urine clear, patient comfortable   -foley to come out on Monday per urology  3/25 Foley was removed patient continues to have urinary retention requiring IC.  Increase Flomax to 0.8 mg  3/26 intermittent blood noted with IC, if continues will restart foley  3/27 Urine no longer blood tinged, continue current regimen, consider restart foley if reoccurs  3/27 continues to require catheterization but no longer blood-tinged Addendum 3/8 foley started after traumatic cath, possible false passage, continue foley for now, f/u with urology outpatient   11.  Chronic atrial fibrillation with episodes of bradycardia.  Followed by cardiology services.  Continue Eliquis.  Avoid AV nodal blocking agents   12.  Diabetes mellitus.  Hemoglobin A1c 6.2.    -tightly controlled. Dc SSI and change cbg checks to qam only  CBG (last 3)  Recent Labs    07/31/23 0930  GLUCAP 102*       3/23 good control, po intake sporadic still though 13.  Hypotension.  ProAmatine 10 mg 3 times daily.  Monitor with increased mobility   - Normotensive on admission  3/9- BP running 100-s to 120s systolic- con't regimen  3/19 BP has  been on high side, will change midodrine to 5mg  TID PRN orthostatic hypotension  3/28 BP stable, continue current regimen     08/01/2023    3:57 AM 08/01/2023    3:39 AM 07/31/2023    6:48 PM  Vitals with BMI  Weight 231 lbs 8 oz    BMI 34.17    Systolic  141 129  Diastolic  67 53  Pulse  60 104    14.  Chronic anemia.  Niferex daily/Aranesp.  Monitor for any bleeding episodes   3/8- Hb 7.1 today- will order transfusion- transfusion of 1 unit pRBCs.   3/14 hgb up to 9.1  -3/17 hemoglobin down to 8.4, continue to monitor  -3/19 HGB 8.3 today, stable overall, continue to monitor    -3/27 HGB 7.7- a little lower likely due to GU procedure/hematuria, continue to monitor  16. Leukocytosis  3/9- Pt's WBC is 21.7- up from 16k- is afebrile- due to his recent C diff, that just finished correct dosing of ABX for, called ID for guidance on treatment- don't want to cause more Cdiff- ID consulted ---no new orders  3/10 WBC's down to 17k  --have been hovering above and below 20 for awhile. No new clinical symptoms. Nephrostomy drainage clear, yellow  3/14 wbc's down to 13k.--recheck Monday   3/17 WBC is down to 12.4  3/19 WBC 13.4 today, continue to monitor  3/24 WBC higher today 18.7, Discuss C diff treatment with GI a above 3/27 slightly lower at 17.9, appears to be chronically elevated Recheck Monday  17. Decreased mood reported by therapy  -Consider SSRI  -3/20 patient reports his mood is okay, denies depression this morning.  Continue to monitor 18. Insomnia   -3/20 schedule trazodone and increase to 75mg  at bedtime  -3/21 insomnia improved but bowel movements at night did wake him up a few times  3/23 sleeping better when stooling isn't an issue  19. LE edema  -Check albumin/cmp with next labs. Leg elevation and compression    LOS: 22 days A FACE TO FACE EVALUATION WAS PERFORMED  Angelina Sheriff 08/01/2023, 8:17 AM

## 2023-08-02 LAB — GLUCOSE, CAPILLARY
Glucose-Capillary: 235 mg/dL — ABNORMAL HIGH (ref 70–99)
Glucose-Capillary: 193 mg/dL — ABNORMAL HIGH (ref 70–99)
Glucose-Capillary: 267 mg/dL — ABNORMAL HIGH (ref 70–99)

## 2023-08-02 MED ORDER — CHLORHEXIDINE GLUCONATE CLOTH 2 % EX PADS
6.0000 | MEDICATED_PAD | Freq: Two times a day (BID) | CUTANEOUS | Status: DC
  Administered 2023-08-03 – 2023-08-21 (×37): 6 via TOPICAL

## 2023-08-02 NOTE — Plan of Care (Signed)
 Discontinued home ambualtion goal, and added power WC goal as not anticipating pt to be functional ambulator upon discharge.  Problem: RH Ambulation Goal: LTG Patient will ambulate in home environment (PT) Description: LTG: Patient will ambulate in home environment, # of feet with assistance (PT). Outcome: Not Applicable Flowsheets (Taken 08/02/2023 1645) LTG: Pt will ambulate in home environ  assist needed:: (discontinued as not anticipating pt to be functional ambulator upon discharge) --   Problem: RH Wheelchair Mobility Goal: LTG Patient will propel w/c in controlled environment (PT) Description: LTG: Patient will propel wheelchair in controlled environment, # of feet with assist (PT) Flowsheets (Taken 08/02/2023 1646) LTG: Pt will propel w/c in controlled environ  assist needed:: Independent with assistive device LTG: Propel w/c distance in controlled environment: 150+ Goal: LTG Patient will propel w/c in home environment (PT) Description: LTG: Patient will propel wheelchair in home environment, # of feet with assistance (PT). Flowsheets (Taken 08/02/2023 1646) LTG: Pt will propel w/c in home environ  assist needed:: Independent with assistive device LTG: Propel w/c distance in home environment: 50 feet Goal: LTG Patient will propel w/c in community environment (PT) Description: LTG: Patient will propel wheelchair in community environment, # of feet with assist (PT) Flowsheets (Taken 08/02/2023 1646) LTG: Pt will propel w/c in community environ  assist needed:: Independent with assistive device [id number] w/c distance in community environment: 150+

## 2023-08-02 NOTE — Progress Notes (Signed)
 PROGRESS NOTE   Subjective/Complaints: No events overnight.  Ongoing 8 out of 10 right knee pain.  Vital stable Patient endorses no concerns this morning  ROS: Patient denies fever, new vision changes, dizziness, nausea, vomiting, shortness of breath or chest pain, headache, or mood change. Knee pain-left improved, right ongoing    Objective:   DG Knee Complete 4 Views Right Result Date: 07/31/2023 CLINICAL DATA:  Right knee pain. EXAM: RIGHT KNEE - COMPLETE 4+ VIEW COMPARISON:  None Available. FINDINGS: No evidence of fracture or dislocation. Small suprapatellar joint effusion is noted. Severe degenerative changes seen involving the medial joint space. Severe degenerative changes also seen involving patellofemoral space with osteophyte formation. Mild degenerative changes seen involving lateral joint space. Soft tissues are unremarkable. IMPRESSION: Severe tricompartmental degenerative joint disease. Small suprapatellar joint effusion. No fracture or dislocation. Electronically Signed   By: Lupita Raider M.D.   On: 07/31/2023 20:35     No results for input(s): "WBC", "HGB", "HCT", "PLT" in the last 72 hours.   No results for input(s): "NA", "K", "CL", "CO2", "GLUCOSE", "BUN", "CREATININE", "CALCIUM" in the last 72 hours.    Intake/Output Summary (Last 24 hours) at 08/02/2023 0900 Last data filed at 08/02/2023 0636 Gross per 24 hour  Intake --  Output 1550 ml  Net -1550 ml     Pressure Injury 07/10/23 Buttocks Right;Mid;Upper Stage 2 -  Partial thickness loss of dermis presenting as a shallow open injury with a red, pink wound bed without slough. Pink opened stage 2 right upper buttock (Active)  07/10/23 1608  Location: Buttocks  Location Orientation: Right;Mid;Upper  Staging: Stage 2 -  Partial thickness loss of dermis presenting as a shallow open injury with a red, pink wound bed without slough.  Wound Description  (Comments): Pink opened stage 2 right upper buttock  Present on Admission: Yes     Pressure Injury 07/10/23 Buttocks Right;Mid;Upper Stage 2 -  Partial thickness loss of dermis presenting as a shallow open injury with a red, pink wound bed without slough. Pink stage 2 directly beside the right upper stage 2 (Active)  07/10/23 1609  Location: Buttocks  Location Orientation: Right;Mid;Upper  Staging: Stage 2 -  Partial thickness loss of dermis presenting as a shallow open injury with a red, pink wound bed without slough.  Wound Description (Comments): Pink stage 2 directly beside the right upper stage 2  Present on Admission: Yes     Pressure Injury 07/10/23 Buttocks Right;Mid;Lower Stage 2 -  Partial thickness loss of dermis presenting as a shallow open injury with a red, pink wound bed without slough. Pink stage 2 to right buttock below the upper stage 2 (Active)  07/10/23 1610  Location: Buttocks  Location Orientation: Right;Mid;Lower  Staging: Stage 2 -  Partial thickness loss of dermis presenting as a shallow open injury with a red, pink wound bed without slough.  Wound Description (Comments): Pink stage 2 to right buttock below the upper stage 2  Present on Admission: Yes     Pressure Injury 07/10/23 Buttocks Left;Mid;Lower Stage 2 -  Partial thickness loss of dermis presenting as a shallow open injury with a red, pink wound bed without  slough. Pink stage 2 to left buttock (Active)  07/10/23 1611  Location: Buttocks  Location Orientation: Left;Mid;Lower  Staging: Stage 2 -  Partial thickness loss of dermis presenting as a shallow open injury with a red, pink wound bed without slough.  Wound Description (Comments): Pink stage 2 to left buttock  Present on Admission: Yes    Physical Exam: Vital Signs Blood pressure 132/67, pulse 60, temperature 97.7 F (36.5 C), resp. rate 18, height 5\' 9"  (1.753 m), weight 103.4 kg, SpO2 100%.  Constitutional: No distress . Vital signs reviewed.   Sitting up in wheelchair at bedside. HEENT: NCAT, EOMI, oral membranes moist Neck: supple Cardiovascular: RRR without murmur. No JVD    Respiratory/Chest: CTA Bilaterally without wheezes or rales. Normal effort    GI/Abdomen: BS +, non-tender, non-distended Ext: no clubbing, cyanosis, 2+ right lower extremity edema with joint effusions at the knee, 1+ left lower extremity.  Psych: pleasant and cooperative Skin: No evidence of breakdown, no evidence of rash Neurologic: Cranial nerves II through XII grossly intact, motor strength is 4/5 in rght 3- left  deltoid, bicep, tricep, grip,3- RIght 4- left  hip flexor, knee extensors, 4/4 B ankle dorsiflexor and plantar flexor.  C/W today's exam 08/02/2023.    -Buttocks MASD--not examined today     MSK: Bilateral knee effusions ongoing Left shoulder tight in ER/IR/F/E        Unchanged 3-30    Assessment/Plan: 1. Functional deficits which require 3+ hours per day of interdisciplinary therapy in a comprehensive inpatient rehab setting. Physiatrist is providing close team supervision and 24 hour management of active medical problems listed below. Physiatrist and rehab team continue to assess barriers to discharge/monitor patient progress toward functional and medical goals  Care Tool:  Bathing  Bathing activity did not occur:  (patient completed a simulated task at bed LOF) Body parts bathed by patient: Right arm, Left arm, Chest, Abdomen, Front perineal area, Right upper leg, Left upper leg, Face   Body parts bathed by helper: Buttocks, Left lower leg, Right lower leg     Bathing assist Assist Level: Maximal Assistance - Patient 24 - 49%     Upper Body Dressing/Undressing Upper body dressing   What is the patient wearing?: Pull over shirt    Upper body assist Assist Level: Contact Guard/Touching assist    Lower Body Dressing/Undressing Lower body dressing      What is the patient wearing?: Pants, Underwear/pull up, Incontinence  brief     Lower body assist Assist for lower body dressing: Maximal Assistance - Patient 25 - 49%     Toileting Toileting    Toileting assist Assist for toileting: 2 Helpers     Transfers Chair/bed transfer  Transfers assist  Chair/bed transfer activity did not occur: Safety/medical concerns (not safe to get up)  Chair/bed transfer assist level: Maximal Assistance - Patient 25 - 49%     Locomotion Ambulation   Ambulation assist   Ambulation activity did not occur: Safety/medical concerns  Assist level: Dependent - Patient 0% Assistive device: Other (comment) (sara plus) Max distance: 18 steps   Walk 10 feet activity   Assist  Walk 10 feet activity did not occur: Safety/medical concerns        Walk 50 feet activity   Assist Walk 50 feet with 2 turns activity did not occur: Safety/medical concerns         Walk 150 feet activity   Assist Walk 150 feet activity did not occur: Safety/medical concerns  Walk 10 feet on uneven surface  activity   Assist Walk 10 feet on uneven surfaces activity did not occur: Safety/medical concerns         Wheelchair     Assist Is the patient using a wheelchair?: Yes Type of Wheelchair: Power    Wheelchair assist level: Supervision/Verbal cueing Max wheelchair distance: 300+    Wheelchair 50 feet with 2 turns activity    Assist        Assist Level: Supervision/Verbal cueing   Wheelchair 150 feet activity     Assist      Assist Level: Supervision/Verbal cueing   Blood pressure 132/67, pulse 60, temperature 97.7 F (36.5 C), resp. rate 18, height 5\' 9"  (1.753 m), weight 103.4 kg, SpO2 100%.   Medical Problem List and Plan: 1. Functional deficits secondary to debility related to septic shock/C. difficile colitis/AKI superimposed on CKD stage IV             -patient may shower with tubes/drains covered             -ELOS/Goals:  3/28, min assist PT/OT             -Continue CIR  therapies including PT, OT   -Expected discharge 4/4  2.  Antithrombotics: -DVT/anticoagulation:  Pharmaceutical: Eliquis             -antiplatelet therapy: N/A   3. Pain Management: tramadol prn, Zanaflex 4 mg twice daily as needed   Biliateral OA of knees  -ordered bilateral neoprine knee sleeves --try today  -voltaren gel tid to knees  -have scheduled tramadol 50mg  at 0700 and 1200 daily. Continue prn as well  Mild adhesive capsulitis left shoulder---  sports cream. Can use voltaren gel also. Aggressive ROM with therapy and while in bed--continue  -Will check right knee x-ray, left knee x-ray on 3/8 with advanced OA.  Will consult orthopedics for cortisone injections  -3-28: Orthopedics aspirated and injected left knee, with improvement.  Ongoing pain with right knee.  4. Mood/Behavior/Sleep: Trazodone 50 mg nightly as needed.  Provide emotional support             -antipsychotic agents: N/A   5. Neuropsych/cognition: This patient is capable of making decisions on his own behalf.   6. Skin/Wound Care: Routine skin checks   7. Fluids/Electrolytes/Nutrition:    -3/14 po intake remains poor, albumin low, weights falling  -will ask RD for assistance. Have spoken to patient about intake  -?IV albumin (see below)  3/18 discussed with dietitian, patient recorded is eating 0% of his meals.  Appears that wife bringing in 3 meals a day and he is eating about 75%, appears to have fairly adequate intake.  3/27 eating frequent outside foods-doesn't like hospital food as much  8.  Oliguric AKI superimposed on CKD stage IV.  Baseline creatinine 4.9.  CRRT discontinued 2/25.  No current plan for long-term dialysis.  Follow-up renal services              - Per discussion with nephrology Dr. Verna Czech, keep Foley catheter in to allow accurate I's and O's through Monday; then, can remove and do DC Foley trial while continuing to document strict outputs             - Continue daily assessments of renal  function per nephrology   3/8- per renal, Cr leveling out- and con't Foley- will remove Monday  -nephrology following, Cr remains in 4's.   3/14 renal function with some improvement  today     -suspect lower extremity edema is related to renal function/low albumin   Appreciate nephro input   -3/17 BUN/creatinine slightly improved 34/3.31, appreciate nephrology assistance  3/19 BUN/CR stable at 34/3.24, nephrology signing off, ok to check BMET twice a week  3/23 encouraging po this weekend. happy that he's eating food from home  3/24 BUN/CR overall stable at 40/3.26  -Recheck Monday  9.  C. difficile colitis.  Completed course of oral vancomycin 3/7.  DC'd IV Flagyl 3/3. Enteric precautions   3/14 stools formed this morning   3/17 discussed with ID pharmacy, patient having more frequent bowel movements.  Monitor today and if continues consider Dificid treatment  3/18 diarrhea appears to be improving, continue to monitor for now  3/19 Frequent Bms improved, continue to follow  3/21 having more frequent bowel movements again today, continue to monitor and if frequent diarrhea persists this weekend consider treatment with Dificid  3/23- mushy/lqiuid stool again overnight    -no dificid at this point. Discuss with GI first on Monday    -added fiber to regimen yesterday, will increase to bid today  3/24 mushy/ liquid BMs, about 2 a day yesterday, more other days- will discuss GI  3/25 GI recommended repeat C. difficile screening. Discussed with Jerol[facility], hold off on repeat screening test, he only had about 2 bowel movements a day but if continues to have frequent BMs consider retreatment with oral vancomycin for 10 days  3/26 pt reports Bms more firm, continue to monitor   3/28 patient reports BMs have been more solid, continue to monitor.  If worsening diarrhea occurs consider retreatment with vancomycin oral for 10 days  3-29: Diarrhea this a.m., no bowel movements this afternoon  recorded  3-30: No BM since yesterday.  10.  Status post left nephrostomy tube d/t staghorn calculi. Has Foley Nephrostomy tube placed 1/29 per IR with plan for ureteric stent placement 3/21              - Careful monitoring of nephrostomy output  -3/14 flomax has been added to improve emptying  -3/21 patient scheduled today for cystoscopy with left retrograde pyelogram, left ureteroscopy laser lithotripsy and left ureteral stent placement.     3/22- procedure appears successful thus far.    -urine clear, patient comfortable   -foley to come out on Monday per urology  3/25 Foley was removed patient continues to have urinary retention requiring IC.  Increase Flomax to 0.8 mg  3/26 intermittent blood noted with IC, if continues will restart foley  3/27 Urine no longer blood tinged, continue current regimen, consider restart foley if reoccurs  3/27 continues to require catheterization but no longer blood-tinged Addendum 3/28 foley started after traumatic cath, possible false passage, continue foley for now, f/u with urology outpatient  Tolerating Foley well  11.  Chronic atrial fibrillation with episodes of bradycardia.  Followed by cardiology services.  Continue Eliquis.  Avoid AV nodal blocking agents   12.  Diabetes mellitus.  Hemoglobin A1c 6.2.    -tightly controlled. Dc SSI and change cbg checks to qam only   CBG (last 3)  Recent Labs    08/02/23 0605 08/02/23 1137 08/02/23 2029  GLUCAP 235* 193* 267*       3-30: Recently increased.  Somewhat expected with intra-articular steroid injection.  Continue to trend.  13.  Hypotension.  ProAmatine 10 mg 3 times daily.  Monitor with increased mobility   - Normotensive on admission  3/9- BP  running 100-s to 120s systolic- con't regimen  3/19 BP has been on high side, will change midodrine to 5mg  TID PRN orthostatic hypotension  3/28 BP stable, continue current regimen  -Vital stable, no complaints of orthostasis.     08/02/2023     6:10 AM 08/02/2023    5:00 AM 08/01/2023    7:53 PM  Vitals with BMI  Weight  227 lbs 15 oz   BMI  33.65   Systolic 132  139  Diastolic 67  75  Pulse 60  62    14.  Chronic anemia.  Niferex daily/Aranesp.  Monitor for any bleeding episodes   3/8- Hb 7.1 today- will order transfusion- transfusion of 1 unit pRBCs.   3/14 hgb up to 9.1  -3/17 hemoglobin down to 8.4, continue to monitor  -3/19 HGB 8.3 today, stable overall, continue to monitor    -3/27 HGB 7.7- a little lower likely due to GU procedure/hematuria, continue to monitor  16. Leukocytosis  3/9- Pt's WBC is 21.7- up from 16k- is afebrile- due to his recent C diff, that just finished correct dosing of ABX for, called ID for guidance on treatment- don't want to cause more Cdiff- ID consulted ---no new orders  3/10 WBC's down to 17k  --have been hovering above and below 20 for awhile. No new clinical symptoms. Nephrostomy drainage clear, yellow  3/14 wbc's down to 13k.--recheck Monday   3/17 WBC is down to 12.4  3/19 WBC 13.4 today, continue to monitor  3/24 WBC higher today 18.7, Discuss C diff treatment with GI a above 3/27 slightly lower at 17.9, appears to be chronically elevated Recheck Monday--likely to be elevated from the injection  17. Decreased mood reported by therapy  -Consider SSRI  -3/20 patient reports his mood is okay, denies depression this morning.  Continue to monitor  18. Insomnia   -3/20 schedule trazodone and increase to 75mg  at bedtime  -3/21 insomnia improved but bowel movements at night did wake him up a few times  3/23 sleeping better when stooling isn't an issue  19. LE edema  -Check albumin/cmp with next labs. Leg elevation and compression   -Significantly improved on the left after steroid injection and effusion tap.  LOS: 23 days A FACE TO FACE EVALUATION WAS PERFORMED  Angelina Sheriff 08/02/2023, 9:00 AM

## 2023-08-03 DIAGNOSIS — I951 Orthostatic hypotension: Secondary | ICD-10-CM

## 2023-08-03 LAB — CBC
HCT: 25.1 % — ABNORMAL LOW (ref 39.0–52.0)
Hemoglobin: 7.6 g/dL — ABNORMAL LOW (ref 13.0–17.0)
MCH: 26.4 pg (ref 26.0–34.0)
MCHC: 30.3 g/dL (ref 30.0–36.0)
MCV: 87.2 fL (ref 80.0–100.0)
Platelets: 368 10*3/uL (ref 150–400)
RBC: 2.88 MIL/uL — ABNORMAL LOW (ref 4.22–5.81)
RDW: 16.1 % — ABNORMAL HIGH (ref 11.5–15.5)
WBC: 19 10*3/uL — ABNORMAL HIGH (ref 4.0–10.5)
nRBC: 0.1 % (ref 0.0–0.2)

## 2023-08-03 LAB — COMPREHENSIVE METABOLIC PANEL WITH GFR
ALT: 27 U/L (ref 0–44)
AST: 19 U/L (ref 15–41)
Albumin: 2.1 g/dL — ABNORMAL LOW (ref 3.5–5.0)
Alkaline Phosphatase: 145 U/L — ABNORMAL HIGH (ref 38–126)
Anion gap: 11 (ref 5–15)
BUN: 74 mg/dL — ABNORMAL HIGH (ref 8–23)
CO2: 12 mmol/L — ABNORMAL LOW (ref 22–32)
Calcium: 8.1 mg/dL — ABNORMAL LOW (ref 8.9–10.3)
Chloride: 109 mmol/L (ref 98–111)
Creatinine, Ser: 4.22 mg/dL — ABNORMAL HIGH (ref 0.61–1.24)
GFR, Estimated: 14 mL/min — ABNORMAL LOW (ref >60)
Glucose, Bld: 127 mg/dL — ABNORMAL HIGH (ref 70–99)
Potassium: 4 mmol/L (ref 3.5–5.1)
Sodium: 132 mmol/L — ABNORMAL LOW (ref 135–145)
Total Bilirubin: 0.2 mg/dL (ref 0.0–1.2)
Total Protein: 5.5 g/dL — ABNORMAL LOW (ref 6.5–8.1)

## 2023-08-03 LAB — SUSCEPTIBILITY RESULT

## 2023-08-03 LAB — URINE CULTURE: Culture: >=100000 — AB

## 2023-08-03 LAB — SUSCEPTIBILITY, AER + ANAEROB

## 2023-08-03 LAB — BODY FLUID CULTURE W GRAM STAIN: Culture: NO GROWTH

## 2023-08-03 LAB — GLUCOSE, CAPILLARY
Glucose-Capillary: 122 mg/dL — ABNORMAL HIGH (ref 70–99)
Glucose-Capillary: 151 mg/dL — ABNORMAL HIGH (ref 70–99)
Glucose-Capillary: 164 mg/dL — ABNORMAL HIGH (ref 70–99)

## 2023-08-03 MED ORDER — SODIUM CHLORIDE 0.9 % IV SOLN: INTRAVENOUS | Status: DC

## 2023-08-03 NOTE — Progress Notes (Addendum)
 PROGRESS NOTE   Subjective/Complaints: No new concerns this morning.  No new events overnight noted.  Continues to have right sided knee pain, left knee pain improved after injection.  Patient reports diarrhea remains improved.  ROS: Patient denies fever, new vision changes, dizziness, nausea, vomiting, shortness of breath or chest pain, headache, or mood change. Diarrhea- improved Knee pain-left improved, right ongoing    Objective:   No results found.    Recent Labs    08/03/23 0521  WBC 19.0*  HGB 7.6*  HCT 25.1*  PLT 368     Recent Labs    08/03/23 0521  NA 132*  K 4.0  CL 109  CO2 12*  GLUCOSE 127*  BUN 74*  CREATININE 4.22*  CALCIUM 8.1*      Intake/Output Summary (Last 24 hours) at 08/03/2023 1243 Last data filed at 08/02/2023 2309 Gross per 24 hour  Intake --  Output 500 ml  Net -500 ml     Pressure Injury 07/10/23 Buttocks Right;Mid;Upper Stage 2 -  Partial thickness loss of dermis presenting as a shallow open injury with a red, pink wound bed without slough. Pink opened stage 2 right upper buttock (Active)  07/10/23 1608  Location: Buttocks  Location Orientation: Right;Mid;Upper  Staging: Stage 2 -  Partial thickness loss of dermis presenting as a shallow open injury with a red, pink wound bed without slough.  Wound Description (Comments): Pink opened stage 2 right upper buttock  Present on Admission: Yes     Pressure Injury 07/10/23 Buttocks Right;Mid;Upper Stage 2 -  Partial thickness loss of dermis presenting as a shallow open injury with a red, pink wound bed without slough. Pink stage 2 directly beside the right upper stage 2 (Active)  07/10/23 1609  Location: Buttocks  Location Orientation: Right;Mid;Upper  Staging: Stage 2 -  Partial thickness loss of dermis presenting as a shallow open injury with a red, pink wound bed without slough.  Wound Description (Comments): Pink stage 2  directly beside the right upper stage 2  Present on Admission: Yes     Pressure Injury 07/10/23 Buttocks Right;Mid;Lower Stage 2 -  Partial thickness loss of dermis presenting as a shallow open injury with a red, pink wound bed without slough. Pink stage 2 to right buttock below the upper stage 2 (Active)  07/10/23 1610  Location: Buttocks  Location Orientation: Right;Mid;Lower  Staging: Stage 2 -  Partial thickness loss of dermis presenting as a shallow open injury with a red, pink wound bed without slough.  Wound Description (Comments): Pink stage 2 to right buttock below the upper stage 2  Present on Admission: Yes     Pressure Injury 07/10/23 Buttocks Left;Mid;Lower Stage 2 -  Partial thickness loss of dermis presenting as a shallow open injury with a red, pink wound bed without slough. Pink stage 2 to left buttock (Active)  07/10/23 1611  Location: Buttocks  Location Orientation: Left;Mid;Lower  Staging: Stage 2 -  Partial thickness loss of dermis presenting as a shallow open injury with a red, pink wound bed without slough.  Wound Description (Comments): Pink stage 2 to left buttock  Present on Admission: Yes    Physical  Exam: Vital Signs Blood pressure 123/67, pulse 66, temperature 97.7 F (36.5 C), resp. rate 18, height 5\' 9"  (1.753 m), weight 106.1 kg, SpO2 100%.  Constitutional: No distress . Vital signs reviewed.  Sitting up in wheelchair at bedside. HEENT: NCAT, EOMI, oral membranes moist Neck: supple Cardiovascular: RRR without murmur. No JVD    Respiratory/Chest: CTA Bilaterally without wheezes or rales. Normal effort    GI/Abdomen: BS +, non-tender, non-distended Ext: no clubbing, cyanosis, 2+ right lower extremity edema with joint effusions at the knee, 1+ left lower extremity. GU: yellow urine in foley bag  Psych: pleasant and cooperative Skin: No evidence of breakdown, no evidence of rash Neurologic: Cranial nerves II through XII grossly intact, motor strength is  4/5 in rght 3- left  deltoid, bicep, tricep, grip,3- RIght 4- left  hip flexor, knee extensors, 4/4 B ankle dorsiflexor and plantar flexor.  C/W today's exam 08/03/2023.    -Buttocks MASD--not examined today     MSK: Bilateral knee effusions ongoing Left shoulder tight in ER/IR/F/E        Unchanged 3-31    Assessment/Plan: 1. Functional deficits which require 3+ hours per day of interdisciplinary therapy in a comprehensive inpatient rehab setting. Physiatrist is providing close team supervision and 24 hour management of active medical problems listed below. Physiatrist and rehab team continue to assess barriers to discharge/monitor patient progress toward functional and medical goals  Care Tool:  Bathing  Bathing activity did not occur:  (patient completed a simulated task at bed LOF) Body parts bathed by patient: Right arm, Left arm, Chest, Abdomen, Front perineal area, Right upper leg, Left upper leg, Face   Body parts bathed by helper: Buttocks, Left lower leg, Right lower leg     Bathing assist Assist Level: Maximal Assistance - Patient 24 - 49%     Upper Body Dressing/Undressing Upper body dressing   What is the patient wearing?: Pull over shirt    Upper body assist Assist Level: Contact Guard/Touching assist    Lower Body Dressing/Undressing Lower body dressing      What is the patient wearing?: Pants, Underwear/pull up, Incontinence brief     Lower body assist Assist for lower body dressing: Maximal Assistance - Patient 25 - 49%     Toileting Toileting    Toileting assist Assist for toileting: 2 Helpers     Transfers Chair/bed transfer  Transfers assist  Chair/bed transfer activity did not occur: Safety/medical concerns (not safe to get up)  Chair/bed transfer assist level: Maximal Assistance - Patient 25 - 49%     Locomotion Ambulation   Ambulation assist   Ambulation activity did not occur: Safety/medical concerns  Assist level: Dependent -  Patient 0% Assistive device: Other (comment) (sara plus) Max distance: 18 steps   Walk 10 feet activity   Assist  Walk 10 feet activity did not occur: Safety/medical concerns        Walk 50 feet activity   Assist Walk 50 feet with 2 turns activity did not occur: Safety/medical concerns         Walk 150 feet activity   Assist Walk 150 feet activity did not occur: Safety/medical concerns         Walk 10 feet on uneven surface  activity   Assist Walk 10 feet on uneven surfaces activity did not occur: Safety/medical concerns         Wheelchair     Assist Is the patient using a wheelchair?: Yes Type of Wheelchair: Power  Wheelchair assist level: Supervision/Verbal cueing Max wheelchair distance: 300+    Wheelchair 50 feet with 2 turns activity    Assist        Assist Level: Supervision/Verbal cueing   Wheelchair 150 feet activity     Assist      Assist Level: Supervision/Verbal cueing   Blood pressure 123/67, pulse 66, temperature 97.7 F (36.5 C), resp. rate 18, height 5\' 9"  (1.753 m), weight 106.1 kg, SpO2 100%.   Medical Problem List and Plan: 1. Functional deficits secondary to debility related to septic shock/C. difficile colitis/AKI superimposed on CKD stage IV             -patient may shower with tubes/drains covered             -ELOS/Goals:  3/28, min assist PT/OT             -Continue CIR therapies including PT, OT   -Expected discharge 4/4  2.  Antithrombotics: -DVT/anticoagulation:  Pharmaceutical: Eliquis             -antiplatelet therapy: N/A   3. Pain Management: tramadol prn, Zanaflex 4 mg twice daily as needed   Biliateral OA of knees  -ordered bilateral neoprine knee sleeves --try today  -voltaren gel tid to knees  -have scheduled tramadol 50mg  at 0700 and 1200 daily. Continue prn as well  Mild adhesive capsulitis left shoulder---  sports cream. Can use voltaren gel also. Aggressive ROM with therapy and  while in bed--continue  -Will check right knee x-ray, left knee x-ray on 3/8 with advanced OA.  Will consult orthopedics for cortisone injections  -3-28: Orthopedics aspirated and injected left knee, with improvement.  Ongoing pain with right knee.  3/31 Knee fluid calcium pyrophosphate cyrystals, pseudogout?, cultures with no growth    4. Mood/Behavior/Sleep: Trazodone 50 mg nightly as needed.  Provide emotional support             -antipsychotic agents: N/A   5. Neuropsych/cognition: This patient is capable of making decisions on his own behalf.   6. Skin/Wound Care: Routine skin checks   7. Fluids/Electrolytes/Nutrition:    -3/14 po intake remains poor, albumin low, weights falling  -will ask RD for assistance. Have spoken to patient about intake  -?IV albumin (see below)  3/18 discussed with dietitian, patient recorded is eating 0% of his meals.  Appears that wife bringing in 3 meals a day and he is eating about 75%, appears to have fairly adequate intake.  3/27 eating frequent outside foods-doesn't like hospital food as much  8.  Oliguric AKI superimposed on CKD stage IV.  Baseline creatinine 4.9.  CRRT discontinued 2/25.  No current plan for long-term dialysis.  Follow-up renal services              - Per discussion with nephrology Dr. Verna Czech, keep Foley catheter in to allow accurate I's and O's through Monday; then, can remove and do DC Foley trial while continuing to document strict outputs             - Continue daily assessments of renal function per nephrology   3/8- per renal, Cr leveling out- and con't Foley- will remove Monday  -nephrology following, Cr remains in 4's.   3/14 renal function with some improvement today     -suspect lower extremity edema is related to renal function/low albumin   Appreciate nephro input   -3/17 BUN/creatinine slightly improved 34/3.31, appreciate nephrology assistance  3/19 BUN/CR stable at 34/3.24, nephrology signing  off, ok to check BMET  twice a week  3/23 encouraging po this weekend. happy that he's eating food from home  3/24 BUN/CR overall stable at 40/3.26  -3/31 BUN/Cr a little higher today  9.  C. difficile colitis.  Completed course of oral vancomycin 3/7.  DC'd IV Flagyl 3/3. Enteric precautions   3/14 stools formed this morning   3/17 discussed with ID pharmacy, patient having more frequent bowel movements.  Monitor today and if continues consider Dificid treatment  3/18 diarrhea appears to be improving, continue to monitor for now  3/19 Frequent Bms improved, continue to follow  3/21 having more frequent bowel movements again today, continue to monitor and if frequent diarrhea persists this weekend consider treatment with Dificid  3/23- mushy/lqiuid stool again overnight    -no dificid at this point. Discuss with GI first on Monday    -added fiber to regimen yesterday, will increase to bid today  3/24 mushy/ liquid BMs, about 2 a day yesterday, more other days- will discuss GI  3/25 GI recommended repeat C. difficile screening. Discussed with Jerol[facility], hold off on repeat screening test, he only had about 2 bowel movements a day but if continues to have frequent BMs consider retreatment with oral vancomycin for 10 days  3/26 pt reports Bms more firm, continue to monitor   3/28 patient reports BMs have been more solid, continue to monitor.  If worsening diarrhea occurs consider retreatment with vancomycin oral for 10 days  3-29: Diarrhea this a.m., no bowel movements this afternoon recorded  3-30: No BM since yesterday.  3-31 diarrhea continues to be improved overall  10.  Status post left nephrostomy tube d/t staghorn calculi. Has Foley Nephrostomy tube placed 1/29 per IR with plan for ureteric stent placement 3/21              - Careful monitoring of nephrostomy output  -3/14 flomax has been added to improve emptying  -3/21 patient scheduled today for cystoscopy with left retrograde pyelogram, left  ureteroscopy laser lithotripsy and left ureteral stent placement.     3/22- procedure appears successful thus far.    -urine clear, patient comfortable   -foley to come out on Monday per urology  3/25 Foley was removed patient continues to have urinary retention requiring IC.  Increase Flomax to 0.8 mg  3/26 intermittent blood noted with IC, if continues will restart foley  3/27 Urine no longer blood tinged, continue current regimen, consider restart foley if reoccurs  3/27 continues to require catheterization but no longer blood-tinged Addendum 3/28 foley started after traumatic cath, possible false passage, continue foley for now, f/u with urology outpatient 3/31 continue foley, no longer blood tinged  11.  Chronic atrial fibrillation with episodes of bradycardia.  Followed by cardiology services.  Continue Eliquis.  Avoid AV nodal blocking agents   12.  Diabetes mellitus.  Hemoglobin A1c 6.2.    -tightly controlled. Dc SSI and change cbg checks to qam only   CBG (last 3)  Recent Labs    08/02/23 2029 08/03/23 0602 08/03/23 1146  GLUCAP 267* 122* 151*       3-30: Recently increased.  Somewhat expected with intra-articular steroid injection.  Continue to trend.  13.  Hypotension.  ProAmatine 10 mg 3 times daily.  Monitor with increased mobility   - Normotensive on admission  3/9- BP running 100-s to 120s systolic- con't regimen  3/19 BP has been on high side, will change midodrine to 5mg  TID PRN  orthostatic hypotension  3/28 BP stable, continue current regimen  -3/31 BP has been stable, continue to monitor     08/03/2023    6:00 AM 08/03/2023    5:00 AM 08/02/2023    8:00 PM  Vitals with BMI  Weight  233 lbs 15 oz   BMI  34.53   Systolic 123  127  Diastolic 67  71  Pulse 66  65    14.  Chronic anemia.  Niferex daily/Aranesp.  Monitor for any bleeding episodes   3/8- Hb 7.1 today- will order transfusion- transfusion of 1 unit pRBCs.   3/14 hgb up to 9.1  -3/17 hemoglobin  down to 8.4, continue to monitor  -3/19 HGB 8.3 today, stable overall, continue to monitor    -3/27 HGB 7.7- a little lower likely due to GU procedure/hematuria, continue to monitor  3/31 HGB 7.6 today, transfuse 7 or less  16. Leukocytosis  3/9- Pt's WBC is 21.7- up from 16k- is afebrile- due to his recent C diff, that just finished correct dosing of ABX for, called ID for guidance on treatment- don't want to cause more Cdiff- ID consulted ---no new orders  3/10 WBC's down to 17k  --have been hovering above and below 20 for awhile. No new clinical symptoms. Nephrostomy drainage clear, yellow  3/14 wbc's down to 13k.--recheck Monday   3/17 WBC is down to 12.4  3/19 WBC 13.4 today, continue to monitor  3/24 WBC higher today 18.7, Discuss C diff treatment with GI a above 3/31 WBC still elevated 19.0, monitor for signs of infection  17. Decreased mood reported by therapy  -Consider SSRI  -3/20 patient reports his mood is okay, denies depression this morning.  Continue to monitor  18. Insomnia   -3/20 schedule trazodone and increase to 75mg  at bedtime  -3/21 insomnia improved but bowel movements at night did wake him up a few times  3/23 sleeping better when stooling isn't an issue  19. LE edema  -Check albumin/cmp with next labs. Leg elevation and compression   -Significantly improved on the left after steroid injection and effusion tap.  LOS: 24 days A FACE TO FACE EVALUATION WAS PERFORMED  Fanny Dance 08/03/2023, 12:43 PM

## 2023-08-03 NOTE — Progress Notes (Signed)
 Patient ID: Calvin Schultz, male   DOB: [birth year], 73 y.o.   MRN: [id number]  Met with pt and wife who is present to answer their questions regarding rental handicapped vans and follow up. Discussed a few of rental [employer] and wife will follow up with. Do not want to purchase one due to feel it will be short term. Discussed home health preference and they had none. Will order DME and find [facility] agency to take referral and plan for discharge Friday

## 2023-08-03 NOTE — Progress Notes (Signed)
 Occupational Therapy Weekly Progress Note  Patient Details  Name: Calvin Schultz MRN: [id number] Date of Birth: 02/16/1951  Beginning of progress report period: July 27, 2023 End of progress report period: August 03, 2023  {CHL IP REHAB OT TIME CALCULATIONS:304400400}   Patient has met {number 1-5:22450} of {number 1-5:20334} short term goals.  ***  Patient continues to demonstrate the following deficits: {impairments:3041632} and therefore will continue to benefit from skilled OT intervention to enhance overall performance with {ADL/iADL:3041649}.  Patient {LTG progression:3041653}.  {plan of BJYN:8295621}  OT Short Term Goals {OT HYQ:6578469}  Skilled Therapeutic Interventions/Progress Updates:    Patient agreeable to participate in OT session. Reports *** pain level.   Patient participated in skilled OT session focusing on ***. Therapist facilitated/assessed/developed/educated/integrated/elicited *** in order to improve/facilitate/promote    Therapy Documentation Precautions:  Precautions Precautions: Fall Precaution/Restrictions Comments: Foley, bilateral knee pain and decreased ROM Restrictions Weight Bearing Restrictions Per Provider Order: No   Therapy/Group: Individual Therapy  Limmie Patricia, OTR/L,CBIS  Supplemental OT - [facility] and [facility] Secure Chat Preferred   08/03/2023, 4:41 PM

## 2023-08-03 NOTE — Progress Notes (Signed)
 Occupational Therapy Session Note  Patient Details  Name: Calvin Schultz MRN: [id number] Date of Birth: 04/14/1951  Today's Date: 08/03/2023 OT Individual Time: 0830-0930 OT Individual Time Calculation (min): 60 min    Short Term Goals: Week 1:  OT Short Term Goal 1 (Week 1): The pt will transfer from supine in bed to chair LOF with ModA using a sliding board x2 at 95% safe. OT Short Term Goal 1 - Progress (Week 1): Met OT Short Term Goal 2 (Week 1): The pt will complete UB dressing with s/u A at 95% safe OT Short Term Goal 2 - Progress (Week 1): Progressing toward goal OT Short Term Goal 3 (Week 1): The pt will complete LB bathing with  MinA incorporating AE at 95% safe OT Short Term Goal 3 - Progress (Week 1): Not met OT Short Term Goal 4 (Week 1): The pt will complete LB dressing with MinA incorporating AE at 95% safe OT Short Term Goal 4 - Progress (Week 1): Not met OT Short Term Goal 5 (Week 1): The pt will tolerate > 45 minutes of OT activity at 95% safe. OT Short Term Goal 5 - Progress (Week 1): Met Week 2:  OT Short Term Goal 1 (Week 2): Pt will complete LB dressing while seated on EOB with Mod A while utilizing AE as needed. OT Short Term Goal 1 - Progress (Week 2): Met OT Short Term Goal 2 (Week 2): Pt will consistently demonstrate lateral scoot transfer at Min A with use of SB when transferring from surface to surface. OT Short Term Goal 2 - Progress (Week 2): Partly met Week 3:  OT Short Term Goal 1 (Week 3): Pt will consistently demonstrate lateral scoot transfer at Min A with use of SB when transferring from surface to surface. OT Short Term Goal 2 (Week 3): Pt will complete toileting with Mod A while utilizing drop arm BSC.     Skilled Therapeutic Interventions/Progress Updates:    Upon entering room, pt stated he had a bowel movement.  Unfortunately it was extremely liquidy and seeped around to the front and got onto his foley, the anchor, his clothing. The RN arrived to  assist with clean up.  It took 45 minutes to cleanse pt due to spillage as pt worked on rolling side to side with cues to use his core strength.  Donned TED hose and knee supports, new shorts.    Pt sat to EOB with heavy mod A and mod A to position self on bed.  Slide board to [facility] to his R with heavy mod A and cues for forward lean and leaning head to L.  Pt used recline function of chair to better position self in chair.  Pt in chair at end of session and all needs met.   Therapy Documentation Precautions:  Precautions Precautions: Fall Precaution/Restrictions Comments: Foley, bilateral knee pain and decreased ROM Restrictions Weight Bearing Restrictions Per Provider Order: No   Pain: Pain Assessment Pain Scale: 0-10 Pain Score: 10-Worst pain ever Pain Location: Knee Pain Intervention(s): Medication (See eMAR) ADL: ADL Eating: Set up Where Assessed-Eating: Bed level Grooming: Setup Where Assessed-Grooming: Edge of bed Upper Body Bathing: Minimal assistance Lower Body Bathing: Dependent (secondary to joint constriction associated with arthritis) Where Assessed-Lower Body Bathing: Bed level Upper Body Dressing: Minimal assistance (Secondary to challenges with AROM for BUE coordination) Where Assessed-Upper Body Dressing: Bed level Lower Body Dressing: Dependent Where Assessed-Lower Body Dressing: Bed level Toileting: Dependent Where Assessed-Toileting: Bed level Toilet  Transfer: Dependent (challenges with weight bearing through BLE, primarily his knees) Toilet Transfer Method: Other (comment) (Requires the assistance of device) Toilet Transfer Equipment: Other (comment) (didn't occur based on observation of performance with BLE pt presented as a safety risk.) Tub/Shower Transfer: Dependent Tub/Shower Transfer Method:  (didn't occur based on the pt's performance with BLE) Tub/Shower Equipment: Other (comment) (didn't occur secondary to safety concerns with limited mobility of  BLE) Film/video editor: Dependent Film/video editor Method: Fish farm manager (based on limitation with BLE) Astronomer: Other (comment) (shower didn't occur, however, based on limited mobility with BLE the pt presents a Dep at this time and would require the Maxi for safe transfer)  Therapy/Group: Individual Therapy  Zaraya Delauder 08/03/2023, 11:44 AM

## 2023-08-03 NOTE — Progress Notes (Signed)
 Physical Therapy Weekly Progress Note  Patient Details  Name: Calvin Schultz MRN: [id number] Date of Birth: 1950/08/11  Beginning of progress report period: July 25, 2023 End of progress report period: August 03, 2023  Today's Date: 08/03/2023 PT Individual Time: 1300-1431, 5284-1324 PT Individual Time Calculation (min): 91 min, 73 min   Patient has met 3 of 4 short term goals.  Pt overall progress has been limited and inconsistent 2/2 B knee pain, fatigue, lack of sleep at night, and frequent bowel incontinence/diarrhea. Pt is completing bed mobility with supervision/heavy min A with use of bed rails and bed features, slide board transfer with min-mod A, sit to stand with RW and/or steady and +2 max A, ambulation 18 steps with sara plus and +2 A. Pt is navigating power WC with supervision/mod I. Began hands on family training with pt wife, Nettie Elm. D/C scheduled for 4/4. Discontinued standing/ambulation goals 2/2 lack of progress. Downgraded slide board transfer, bed mobility goals, and WC goals for pt overall safety and consistency.   Patient continues to demonstrate the following deficits muscle weakness and muscle joint tightness, decreased cardiorespiratoy endurance,  , and decreased sitting balance, decreased standing balance, decreased postural control, and decreased balance strategies and therefore will continue to benefit from skilled PT intervention to increase functional independence with mobility.  Patient not progressing toward long term goals.  See goal revision..  Plan of care revisions: 3/31.  PT Short Term Goals Week 3:  PT Short Term Goal 1 (Week 3): Pt will trail slide board transfer to pt car PT Short Term Goal 1 - Progress (Week 3): Met PT Short Term Goal 2 (Week 3): pt will complete custom WC eval PT Short Term Goal 2 - Progress (Week 3): Met PT Short Term Goal 3 (Week 3): pt will perform sit to stand with RW and and mod A or less PT Short Term Goal 3 - Progress (Week 3):  Revised due to lack of progress PT Short Term Goal 4 (Week 3): pt wife will begin hands on family training PT Short Term Goal 4 - Progress (Week 3): Progressing toward goal Week 4:  PT Short Term Goal 1 (Week 4): STG=LTG 2/2 ELOS  Skilled Therapeutic Interventions/Progress Updates:      Treatment Session 1  Pt seated in power WC upon arrival. Pt agreeable to therapy. Pt reports 10/10 B knee pain, premedicated-pt reports L knee is overall feeling better 2/2 I&D.   Pt navigated power WC room to main gym with mod I, pt demos carry over of operating power controls. Pt position WC next to mat table with supervision, verbal cues provided for angle of WC.   Pt performed slide board transfer with min A and increased time power WC to mat table. , verbal cues provided for technique.   Pt performed sit to stand with steady and +2 max A to perch. Pt stood from perch x3 with +2 max A, with therapist facilitating hip/trunk extension. Discontinued 2/2 increased pain in B knee.   Pt reports increased fatigue today 2/2 to lack of sleep last night (woke up at 3 am) and also had multiple Bms this morning.   Pt performed lateral trunk lean on mat table B to pull shorts down (to correct wedgie)--pt able to complete with repetition but activity very taxing for pt  Pt performed the following activities for core strengthening/independence with seated balance/postural control   1x10 lateral trunk leans x10 to forearm on mat table   1x10 modified crunch to  wedge  1x10 russian twists while holding medicine ball  1x10 leaning forward and tapping therapist hand with R UE  1x10 seated rows, with verbal and tactile cues provided for correction of scapular elevation  Pt performed slide board transfer mat table to WC and +1 mod A 2/2 fatigue.   Pt performed posterior scoot with use of tilt function.   Pt seated in power WC at end of session.   Treatment Session 2   Pt supine in bed upon arrival. Pt agreeable to  therapy. Pt reports B knee pain, not quantified.   Therapist had extensive conversation regarding pt CLOF and therapist concerns with pt fluctuations with level of assist with bed mobility/slide board transfer with  pain/fatigue/sleep and pt wife ability to manage this at home. Emphasized that pt will need 24/7 assistance at home. Pt and pt wife verbalized understanding and agreeable, and still plan to D/C home.   Education provided for manual hoyer lift to use at home as alternative to slide board transfer as needed when pt requiring increased assist. Pt and pt wife open to suggestion.   Therapist providing demonstration of how to operate hoyer lift, place/remove sling. Pt required mod A for rolling B for placement of sling.  Pt and pt wife returned demonstration throughout session, with verbal cues provided for sequencing/technique.   Attempted hoyer lift transfer to bed x3 from supine positioning to power WC with PT, tech and pt wife, unable to successfully perform with traditional method 2/2 to difficulty getting B LE around hoyer pole.   Will trial hoyer lift from seated EOB next session.   Pt wife positioned power WC next to pt for slide board transfer with intermittent assist from therapist. Therapist emphasized positioning power WC to face HOB versus foot of bed for improved ease. Recommended pt wife practice navigating pt power WC throughout room when pt not in it for improved technique. Will continue to practice throughout next sessions.   Pt performed down hill slide board transfer bed to [facility], with increased time and CGA to stabilize the board. Pt required tilt function for adequate posterior scoot in chair.   Pt seated in power WC at end of session with wife in room and needs within reach.    Therapy Documentation Precautions:  Precautions Precautions: Fall Precaution/Restrictions Comments: Foley, bilateral knee pain and decreased ROM Restrictions Weight Bearing Restrictions Per  Provider Order: No  Therapy/Group: Individual Therapy  [facility] [place], [place], DPT  08/03/2023, 12:24 PM

## 2023-08-03 NOTE — Progress Notes (Incomplete Revision)
 PROGRESS NOTE   Subjective/Complaints: No new concerns this morning.  No new events overnight noted.  Continues to have right sided knee pain, left knee pain improved after injection.  Patient reports diarrhea remains improved.  ROS: Patient denies fever, new vision changes, dizziness, nausea, vomiting, shortness of breath or chest pain, headache, or mood change. Diarrhea- improved Knee pain-left improved, right ongoing    Objective:   No results found.    Recent Labs    08/03/23 0521  WBC 19.0*  HGB 7.6*  HCT 25.1*  PLT 368     Recent Labs    08/03/23 0521  NA 132*  K 4.0  CL 109  CO2 12*  GLUCOSE 127*  BUN 74*  CREATININE 4.22*  CALCIUM 8.1*      Intake/Output Summary (Last 24 hours) at 08/03/2023 1243 Last data filed at 08/02/2023 2309 Gross per 24 hour  Intake --  Output 500 ml  Net -500 ml     Pressure Injury 07/10/23 Buttocks Right;Mid;Upper Stage 2 -  Partial thickness loss of dermis presenting as a shallow open injury with a red, pink wound bed without slough. Pink opened stage 2 right upper buttock (Active)  07/10/23 1608  Location: Buttocks  Location Orientation: Right;Mid;Upper  Staging: Stage 2 -  Partial thickness loss of dermis presenting as a shallow open injury with a red, pink wound bed without slough.  Wound Description (Comments): Pink opened stage 2 right upper buttock  Present on Admission: Yes     Pressure Injury 07/10/23 Buttocks Right;Mid;Upper Stage 2 -  Partial thickness loss of dermis presenting as a shallow open injury with a red, pink wound bed without slough. Pink stage 2 directly beside the right upper stage 2 (Active)  07/10/23 1609  Location: Buttocks  Location Orientation: Right;Mid;Upper  Staging: Stage 2 -  Partial thickness loss of dermis presenting as a shallow open injury with a red, pink wound bed without slough.  Wound Description (Comments): Pink stage 2  directly beside the right upper stage 2  Present on Admission: Yes     Pressure Injury 07/10/23 Buttocks Right;Mid;Lower Stage 2 -  Partial thickness loss of dermis presenting as a shallow open injury with a red, pink wound bed without slough. Pink stage 2 to right buttock below the upper stage 2 (Active)  07/10/23 1610  Location: Buttocks  Location Orientation: Right;Mid;Lower  Staging: Stage 2 -  Partial thickness loss of dermis presenting as a shallow open injury with a red, pink wound bed without slough.  Wound Description (Comments): Pink stage 2 to right buttock below the upper stage 2  Present on Admission: Yes     Pressure Injury 07/10/23 Buttocks Left;Mid;Lower Stage 2 -  Partial thickness loss of dermis presenting as a shallow open injury with a red, pink wound bed without slough. Pink stage 2 to left buttock (Active)  07/10/23 1611  Location: Buttocks  Location Orientation: Left;Mid;Lower  Staging: Stage 2 -  Partial thickness loss of dermis presenting as a shallow open injury with a red, pink wound bed without slough.  Wound Description (Comments): Pink stage 2 to left buttock  Present on Admission: Yes    Physical  Exam: Vital Signs Blood pressure 123/67, pulse 66, temperature 97.7 F (36.5 C), resp. rate 18, height 5\' 9"  (1.753 m), weight 106.1 kg, SpO2 100%.  Constitutional: No distress . Vital signs reviewed.  Sitting up in wheelchair at bedside. HEENT: NCAT, EOMI, oral membranes moist Neck: supple Cardiovascular: RRR without murmur. No JVD    Respiratory/Chest: CTA Bilaterally without wheezes or rales. Normal effort    GI/Abdomen: BS +, non-tender, non-distended Ext: no clubbing, cyanosis, 2+ right lower extremity edema with joint effusions at the knee, 1+ left lower extremity. GU: yellow urine in foley bag  Psych: pleasant and cooperative Skin: No evidence of breakdown, no evidence of rash Neurologic: Cranial nerves II through XII grossly intact, motor strength is  4/5 in rght 3- left  deltoid, bicep, tricep, grip,3- RIght 4- left  hip flexor, knee extensors, 4/4 B ankle dorsiflexor and plantar flexor.  C/W today's exam 08/03/2023.    -Buttocks MASD--not examined today     MSK: Bilateral knee effusions ongoing Left shoulder tight in ER/IR/F/E        Unchanged 3-31    Assessment/Plan: 1. Functional deficits which require 3+ hours per day of interdisciplinary therapy in a comprehensive inpatient rehab setting. Physiatrist is providing close team supervision and 24 hour management of active medical problems listed below. Physiatrist and rehab team continue to assess barriers to discharge/monitor patient progress toward functional and medical goals  Care Tool:  Bathing  Bathing activity did not occur:  (patient completed a simulated task at bed LOF) Body parts bathed by patient: Right arm, Left arm, Chest, Abdomen, Front perineal area, Right upper leg, Left upper leg, Face   Body parts bathed by helper: Buttocks, Left lower leg, Right lower leg     Bathing assist Assist Level: Maximal Assistance - Patient 24 - 49%     Upper Body Dressing/Undressing Upper body dressing   What is the patient wearing?: Pull over shirt    Upper body assist Assist Level: Contact Guard/Touching assist    Lower Body Dressing/Undressing Lower body dressing      What is the patient wearing?: Pants, Underwear/pull up, Incontinence brief     Lower body assist Assist for lower body dressing: Maximal Assistance - Patient 25 - 49%     Toileting Toileting    Toileting assist Assist for toileting: 2 Helpers     Transfers Chair/bed transfer  Transfers assist  Chair/bed transfer activity did not occur: Safety/medical concerns (not safe to get up)  Chair/bed transfer assist level: Maximal Assistance - Patient 25 - 49%     Locomotion Ambulation   Ambulation assist   Ambulation activity did not occur: Safety/medical concerns  Assist level: Dependent -  Patient 0% Assistive device: Other (comment) (sara plus) Max distance: 18 steps   Walk 10 feet activity   Assist  Walk 10 feet activity did not occur: Safety/medical concerns        Walk 50 feet activity   Assist Walk 50 feet with 2 turns activity did not occur: Safety/medical concerns         Walk 150 feet activity   Assist Walk 150 feet activity did not occur: Safety/medical concerns         Walk 10 feet on uneven surface  activity   Assist Walk 10 feet on uneven surfaces activity did not occur: Safety/medical concerns         Wheelchair     Assist Is the patient using a wheelchair?: Yes Type of Wheelchair: Power  Wheelchair assist level: Supervision/Verbal cueing Max wheelchair distance: 300+    Wheelchair 50 feet with 2 turns activity    Assist        Assist Level: Supervision/Verbal cueing   Wheelchair 150 feet activity     Assist      Assist Level: Supervision/Verbal cueing   Blood pressure 123/67, pulse 66, temperature 97.7 F (36.5 C), resp. rate 18, height 5\' 9"  (1.753 m), weight 106.1 kg, SpO2 100%.   Medical Problem List and Plan: 1. Functional deficits secondary to debility related to septic shock/C. difficile colitis/AKI superimposed on CKD stage IV             -patient may shower with tubes/drains covered             -ELOS/Goals:  3/28, min assist PT/OT             -Continue CIR therapies including PT, OT   -Expected discharge 4/4  2.  Antithrombotics: -DVT/anticoagulation:  Pharmaceutical: Eliquis             -antiplatelet therapy: N/A   3. Pain Management: tramadol prn, Zanaflex 4 mg twice daily as needed   Biliateral OA of knees  -ordered bilateral neoprine knee sleeves --try today  -voltaren gel tid to knees  -have scheduled tramadol 50mg  at 0700 and 1200 daily. Continue prn as well  Mild adhesive capsulitis left shoulder---  sports cream. Can use voltaren gel also. Aggressive ROM with therapy and  while in bed--continue  -Will check right knee x-ray, left knee x-ray on 3/8 with advanced OA.  Will consult orthopedics for cortisone injections  -3-28: Orthopedics aspirated and injected left knee, with improvement.  Ongoing pain with right knee.  3/31 Knee fluid calcium pyrophosphate cyrystals, pseudogout?, cultures with no growth    4. Mood/Behavior/Sleep: Trazodone 50 mg nightly as needed.  Provide emotional support             -antipsychotic agents: N/A   5. Neuropsych/cognition: This patient is capable of making decisions on his own behalf.   6. Skin/Wound Care: Routine skin checks   7. Fluids/Electrolytes/Nutrition:    -3/14 po intake remains poor, albumin low, weights falling  -will ask RD for assistance. Have spoken to patient about intake  -?IV albumin (see below)  3/18 discussed with dietitian, patient recorded is eating 0% of his meals.  Appears that wife bringing in 3 meals a day and he is eating about 75%, appears to have fairly adequate intake.  3/27 eating frequent outside foods-doesn't like hospital food as much  8.  Oliguric AKI superimposed on CKD stage IV.  Baseline creatinine 4.9.  CRRT discontinued 2/25.  No current plan for long-term dialysis.  Follow-up renal services              - Per discussion with nephrology Dr. Verna Czech, keep Foley catheter in to allow accurate I's and O's through Monday; then, can remove and do DC Foley trial while continuing to document strict outputs             - Continue daily assessments of renal function per nephrology   3/8- per renal, Cr leveling out- and con't Foley- will remove Monday  -nephrology following, Cr remains in 4's.   3/14 renal function with some improvement today     -suspect lower extremity edema is related to renal function/low albumin   Appreciate nephro input   -3/17 BUN/creatinine slightly improved 34/3.31, appreciate nephrology assistance  3/19 BUN/CR stable at 34/3.24, nephrology signing  off, ok to check BMET  twice a week  3/23 encouraging po this weekend. happy that he's eating food from home  3/24 BUN/CR overall stable at 40/3.26  -3/31 BUN/Cr a little higher today, gentle IVF 42ml/hr  9.  C. difficile colitis.  Completed course of oral vancomycin 3/7.  DC'd IV Flagyl 3/3. Enteric precautions   3/14 stools formed this morning   3/17 discussed with ID pharmacy, patient having more frequent bowel movements.  Monitor today and if continues consider Dificid treatment  3/18 diarrhea appears to be improving, continue to monitor for now  3/19 Frequent Bms improved, continue to follow  3/21 having more frequent bowel movements again today, continue to monitor and if frequent diarrhea persists this weekend consider treatment with Dificid  3/23- mushy/lqiuid stool again overnight    -no dificid at this point. Discuss with GI first on Monday    -added fiber to regimen yesterday, will increase to bid today  3/24 mushy/ liquid BMs, about 2 a day yesterday, more other days- will discuss GI  3/25 GI recommended repeat C. difficile screening. Discussed with Jerolyn [facility], hold off on repeat screening test, he only had about 2 bowel movements a day but if continues to have frequent BMs consider retreatment with oral vancomycin for 10 days  3/26 pt reports Bms more firm, continue to monitor   3/28 patient reports BMs have been more solid, continue to monitor.  If worsening diarrhea occurs consider retreatment with vancomycin oral for 10 days  3-29: Diarrhea this a.m., no bowel movements this afternoon recorded  3-30: No BM since yesterday.  3-31 diarrhea continues to be improved overall  10.  Status post left nephrostomy tube d/t staghorn calculi. Has Foley Nephrostomy tube placed 1/29 per IR with plan for ureteric stent placement 3/21              - Careful monitoring of nephrostomy output  -3/14 flomax has been added to improve emptying  -3/21 patient scheduled today for cystoscopy with left retrograde  pyelogram, left ureteroscopy laser lithotripsy and left ureteral stent placement.     3/22- procedure appears successful thus far.    -urine clear, patient comfortable   -foley to come out on Monday per urology  3/25 Foley was removed patient continues to have urinary retention requiring IC.  Increase Flomax to 0.8 mg  3/26 intermittent blood noted with IC, if continues will restart foley  3/27 Urine no longer blood tinged, continue current regimen, consider restart foley if reoccurs  3/27 continues to require catheterization but no longer blood-tinged Addendum 3/28 foley started after traumatic cath, possible false passage, continue foley for now, f/u with urology outpatient 3/31 continue foley, no longer blood tinged  11.  Chronic atrial fibrillation with episodes of bradycardia.  Followed by cardiology services.  Continue Eliquis.  Avoid AV nodal blocking agents   12.  Diabetes mellitus.  Hemoglobin A1c 6.2.    -tightly controlled. Dc SSI and change cbg checks to qam only   CBG (last 3)  Recent Labs    08/02/23 2029 08/03/23 0602 08/03/23 1146  GLUCAP 267* 122* 151*       3-30: Recently increased.  Somewhat expected with intra-articular steroid injection.  Continue to trend.  13.  Hypotension.  ProAmatine 10 mg 3 times daily.  Monitor with increased mobility   - Normotensive on admission  3/9- BP running 100-s to 120s systolic- con't regimen  3/19 BP has been on high side, will change midodrine to  5mg  TID PRN orthostatic hypotension  3/28 BP stable, continue current regimen  -3/31 BP has been stable, continue to monitor     08/03/2023    6:00 AM 08/03/2023    5:00 AM 08/02/2023    8:00 PM  Vitals with BMI  Weight  233 lbs 15 oz   BMI  34.53   Systolic 123  127  Diastolic 67  71  Pulse 66  65    14.  Chronic anemia.  Niferex daily/Aranesp.  Monitor for any bleeding episodes   3/8- Hb 7.1 today- will order transfusion- transfusion of 1 unit pRBCs.   3/14 hgb up to  9.1  -3/17 hemoglobin down to 8.4, continue to monitor  -3/19 HGB 8.3 today, stable overall, continue to monitor    -3/27 HGB 7.7- a little lower likely due to GU procedure/hematuria, continue to monitor  3/31 HGB 7.6 today, transfuse 7 or less  16. Leukocytosis  3/9- Pt's WBC is 21.7- up from 16k- is afebrile- due to his recent C diff, that just finished correct dosing of ABX for, called ID for guidance on treatment- don't want to cause more Cdiff- ID consulted ---no new orders  3/10 WBC's down to 17k  --have been hovering above and below 20 for awhile. No new clinical symptoms. Nephrostomy drainage clear, yellow  3/14 wbc's down to 13k.--recheck Monday   3/17 WBC is down to 12.4  3/19 WBC 13.4 today, continue to monitor  3/24 WBC higher today 18.7, Discuss C diff treatment with GI a above 3/31 WBC still elevated 19.0, monitor for signs of infection  17. Decreased mood reported by therapy  -Consider SSRI  -3/20 patient reports his mood is okay, denies depression this morning.  Continue to monitor  18. Insomnia   -3/20 schedule trazodone and increase to 75mg  at bedtime  -3/21 insomnia improved but bowel movements at night did wake him up a few times  3/23 sleeping better when stooling isn't an issue  19. LE edema  -Check albumin/cmp with next labs. Leg elevation and compression   -Significantly improved on the left after steroid injection and effusion tap.  LOS: 24 days A FACE TO FACE EVALUATION WAS PERFORMED  Fanny Dance 08/03/2023, 12:43 PM

## 2023-08-03 NOTE — Plan of Care (Signed)
 Discontinued standing and ambulation goals 2/2 lack of progress and not anticipating pt to be ambulatory upon discharge.   Downgraded bed mobility goal for pt consistency  Problem: RH Bed Mobility Goal: LTG Patient will perform bed mobility with assist (PT) Description: LTG: Patient will perform bed mobility with assistance, with/without cues (PT). Flowsheets (Taken 08/03/2023 1224) LTG: Pt will perform bed mobility with assistance level of: (downgraded for pt overall consistency) Minimal Assistance - Patient > 75%   Problem: RH Balance Goal: LTG Patient will maintain dynamic standing balance (PT) Description: LTG:  Patient will maintain dynamic standing balance with assistance during mobility activities (PT) Outcome: Not Applicable Flowsheets (Taken 08/03/2023 1224) LTG: Pt will maintain dynamic standing balance during mobility activities with:: (discontinued 2/2 lack of progress) --   Problem: Sit to Stand Goal: LTG:  Patient will perform sit to stand with assistance level (PT) Description: LTG:  Patient will perform sit to stand with assistance level (PT) Outcome: Not Applicable Flowsheets (Taken 08/03/2023 1224) LTG: PT will perform sit to stand in preparation for functional mobility with assistance level: (discontinued 2/2 lack of progress) --   Problem: RH Ambulation Goal: LTG Patient will ambulate in controlled environment (PT) Description: LTG: Patient will ambulate in a controlled environment, # of feet with assistance (PT). Outcome: Not Applicable Flowsheets (Taken 08/03/2023 1224) LTG: Pt will ambulate in controlled environ  assist needed:: (discontinued 2/2 lack of progress) --

## 2023-08-04 DIAGNOSIS — R319 Hematuria, unspecified: Secondary | ICD-10-CM

## 2023-08-04 LAB — BASIC METABOLIC PANEL WITH GFR
Anion gap: 10 (ref 5–15)
BUN: 76 mg/dL — ABNORMAL HIGH (ref 8–23)
CO2: 12 mmol/L — ABNORMAL LOW (ref 22–32)
Calcium: 8.2 mg/dL — ABNORMAL LOW (ref 8.9–10.3)
Chloride: 111 mmol/L (ref 98–111)
Creatinine, Ser: 4.45 mg/dL — ABNORMAL HIGH (ref 0.61–1.24)
GFR, Estimated: 13 mL/min — ABNORMAL LOW (ref >60)
Glucose, Bld: 98 mg/dL (ref 70–99)
Potassium: 4.2 mmol/L (ref 3.5–5.1)
Sodium: 133 mmol/L — ABNORMAL LOW (ref 135–145)

## 2023-08-04 LAB — URINALYSIS, ROUTINE W REFLEX MICROSCOPIC

## 2023-08-04 LAB — CBC
HCT: 25.2 % — ABNORMAL LOW (ref 39.0–52.0)
Hemoglobin: 7.7 g/dL — ABNORMAL LOW (ref 13.0–17.0)
MCH: 26.5 pg (ref 26.0–34.0)
MCHC: 30.6 g/dL (ref 30.0–36.0)
MCV: 86.6 fL (ref 80.0–100.0)
Platelets: 350 10*3/uL (ref 150–400)
RBC: 2.91 MIL/uL — ABNORMAL LOW (ref 4.22–5.81)
RDW: 15.9 % — ABNORMAL HIGH (ref 11.5–15.5)
WBC: 19.1 10*3/uL — ABNORMAL HIGH (ref 4.0–10.5)
nRBC: 0.1 % (ref 0.0–0.2)

## 2023-08-04 LAB — URINALYSIS, MICROSCOPIC (REFLEX)
RBC / HPF: >50 RBC/hpf (ref 0–5)
Squamous Epithelial / HPF: NONE SEEN /HPF (ref 0–5)
WBC, UA: >50 WBC/hpf (ref 0–5)

## 2023-08-04 LAB — GLUCOSE, CAPILLARY: Glucose-Capillary: 91 mg/dL (ref 70–99)

## 2023-08-04 MED ORDER — HYDROCODONE-ACETAMINOPHEN 5-325 MG PO TABS
1.0000 | ORAL_TABLET | Freq: Four times a day (QID) | ORAL | Status: DC | PRN
  Administered 2023-08-04 – 2023-08-20 (×4): 1 via ORAL
  Filled 2023-08-04 (×5): qty 1

## 2023-08-04 MED ORDER — CEPHALEXIN 250 MG PO CAPS
250.0000 mg | ORAL_CAPSULE | Freq: Every day | ORAL | Status: DC
  Administered 2023-08-05 – 2023-08-07 (×3): 250 mg via ORAL
  Filled 2023-08-04 (×3): qty 1

## 2023-08-04 MED ORDER — ONDANSETRON HCL 4 MG/2ML IJ SOLN
4.0000 mg | Freq: Four times a day (QID) | INTRAMUSCULAR | Status: DC | PRN
Start: 2023-08-04 — End: 2023-08-21
  Administered 2023-08-04 – 2023-08-17 (×2): 4 mg via INTRAVENOUS
  Filled 2023-08-04 (×2): qty 2

## 2023-08-04 NOTE — Progress Notes (Signed)
 Physical Therapy Session Note  Patient Details  Name: Calvin Schultz MRN: [id number] Date of Birth: [birth year]  Today's Date: 08/04/2023 PT Individual Time: 1001-1113 PT Individual Time Calculation (min): 72 min   Short Term Goals: Week 4:  PT Short Term Goal 1 (Week 4): STG=LTG 2/2 ELOS  Skilled Therapeutic Interventions/Progress Updates:      Pt seated in power WC upon arrival. Pt agreeable to therapy. Pt reports B knee pain 9/10, nurse present to administer pain medication.   Of note: blood in foley bag today, pt reports MD aware and plans to run some tests.   Pt on IV at start of session, nurse present to remove IV.   Pt performed downhill slide board transfer WC to bed to L with WC facing HOB and heavy mod-max A 2/2 pt fatigue.   Pt performed lateral trunk lean side to side for placement of manual hoyer lift sling. Pt performed manual hoyer lift transfer to power WC from seated EOB position successfully. Pt required tilt function of power WC for safe removal of sling, to prevent pt from sliding anteriorly. Plan to provide education for this method of transfer when pt wife present next session.   Pt required supervision for managing power controls for tilt function/leg elevating function for pt overall safety with fatigue.  Of note: pt typically demos improved ease wit slide board transfer to R versus L. Trailed downhill slide board transfer power WC to bed to R with power WC facing foot of bed, pt able to perform with min A and increased time.   Pt performed lateral scooting to HOB with min A. Sit to supine with use of bed features and heavy min A for B LE management, pt required boost function and B UE on headboard to scoot up in bed.   Pt wife arrived near end of session. Pt wife crying and now considering discharge to SNF versus home 2/2 to safety concerns with her managing him at home.  Therpaist provides education regarding pt CLOF, impairments and concerns with her providing  assist at home. Pt wife plans to have extensive conversation with pt between now and next session to discuss pros and cons and each. Notified Child psychotherapist.   Pt supine in bed at end of session with all needs within reach and bed alarm on.     Therapy Documentation Precautions:  Precautions Precautions: Fall Precaution/Restrictions Comments: Foley, bilateral knee pain and decreased ROM Restrictions Weight Bearing Restrictions Per Provider Order: No  Therapy/Group: Individual Therapy  [facility] Ambrose Finland, [place], DPT  08/04/2023, 7:53 AM

## 2023-08-04 NOTE — Progress Notes (Addendum)
 PROGRESS NOTE   Subjective/Complaints: Patient working with therapy this morning.  Left knee pain continues to be improved, continues to have pain in his right knee.  Reports bowel movements continue to be more firm, denies diarrhea.  Has been having blood in urine since sometime yesterday.  ROS: Patient denies fever, new vision changes, dizziness, nausea, vomiting, shortness of breath or chest pain, headache, or mood change. Diarrhea- improved Knee pain-left improved, right ongoing Hematuria    Objective:   No results found.    Recent Labs    08/03/23 0521  WBC 19.0*  HGB 7.6*  HCT 25.1*  PLT 368     Recent Labs    08/03/23 0521 08/04/23 0531  NA 132* 133*  K 4.0 4.2  CL 109 111  CO2 12* 12*  GLUCOSE 127* 98  BUN 74* 76*  CREATININE 4.22* 4.45*  CALCIUM 8.1* 8.2*      Intake/Output Summary (Last 24 hours) at 08/04/2023 1303 Last data filed at 08/04/2023 6440 Gross per 24 hour  Intake 1202.27 ml  Output 950 ml  Net 252.27 ml     Pressure Injury 07/10/23 Buttocks Right;Mid;Upper Stage 2 -  Partial thickness loss of dermis presenting as a shallow open injury with a red, pink wound bed without slough. Pink opened stage 2 right upper buttock (Active)  07/10/23 1608  Location: Buttocks  Location Orientation: Right;Mid;Upper  Staging: Stage 2 -  Partial thickness loss of dermis presenting as a shallow open injury with a red, pink wound bed without slough.  Wound Description (Comments): Pink opened stage 2 right upper buttock  Present on Admission: Yes     Pressure Injury 07/10/23 Buttocks Right;Mid;Upper Stage 2 -  Partial thickness loss of dermis presenting as a shallow open injury with a red, pink wound bed without slough. Pink stage 2 directly beside the right upper stage 2 (Active)  07/10/23 1609  Location: Buttocks  Location Orientation: Right;Mid;Upper  Staging: Stage 2 -  Partial thickness loss of  dermis presenting as a shallow open injury with a red, pink wound bed without slough.  Wound Description (Comments): Pink stage 2 directly beside the right upper stage 2  Present on Admission: Yes     Pressure Injury 07/10/23 Buttocks Right;Mid;Lower Stage 2 -  Partial thickness loss of dermis presenting as a shallow open injury with a red, pink wound bed without slough. Pink stage 2 to right buttock below the upper stage 2 (Active)  07/10/23 1610  Location: Buttocks  Location Orientation: Right;Mid;Lower  Staging: Stage 2 -  Partial thickness loss of dermis presenting as a shallow open injury with a red, pink wound bed without slough.  Wound Description (Comments): Pink stage 2 to right buttock below the upper stage 2  Present on Admission: Yes     Pressure Injury 07/10/23 Buttocks Left;Mid;Lower Stage 2 -  Partial thickness loss of dermis presenting as a shallow open injury with a red, pink wound bed without slough. Pink stage 2 to left buttock (Active)  07/10/23 1611  Location: Buttocks  Location Orientation: Left;Mid;Lower  Staging: Stage 2 -  Partial thickness loss of dermis presenting as a shallow open injury with a red,  pink wound bed without slough.  Wound Description (Comments): Pink stage 2 to left buttock  Present on Admission: Yes    Physical Exam: Vital Signs Blood pressure 118/70, pulse 65, temperature 97.7 F (36.5 C), resp. rate 16, height 5\' 9"  (1.753 m), weight 104.9 kg, SpO2 100%.  Constitutional: No distress . Vital signs reviewed.  Sitting up in wheelchair at bedside. HEENT: NCAT, EOMI, oral membranes moist Neck: supple Cardiovascular: RRR without murmur. No JVD    Respiratory/Chest: CTA Bilaterally without wheezes or rales. Normal effort    GI/Abdomen: BS +, non-tender, non-distended Ext: no clubbing, cyanosis, 2+ right lower extremity edema with joint effusions at the knee, 1+ left lower extremity. GU: yellow urine in foley bag  Psych: pleasant and  cooperative Skin: No evidence of breakdown, no evidence of rash Neurologic: Cranial nerves II through XII grossly intact, motor strength is 4/5 in rght 3- left  deltoid, bicep, tricep, grip,3- RIght 4- left  hip flexor, knee extensors, 4/4 B ankle dorsiflexor and plantar flexor.  C/W today's exam 08/04/2023.    -Buttocks MASD--not examined today     MSK: Bilateral knee effusions ongoing Left shoulder tight in ER/IR/F/E        Unchanged 3-31    Assessment/Plan: 1. Functional deficits which require 3+ hours per day of interdisciplinary therapy in a comprehensive inpatient rehab setting. Physiatrist is providing close team supervision and 24 hour management of active medical problems listed below. Physiatrist and rehab team continue to assess barriers to discharge/monitor patient progress toward functional and medical goals  Care Tool:  Bathing  Bathing activity did not occur:  (patient completed a simulated task at bed LOF) Body parts bathed by patient: Right arm, Left arm, Chest, Abdomen, Front perineal area, Right upper leg, Left upper leg, Face   Body parts bathed by helper: Buttocks, Left lower leg, Right lower leg     Bathing assist Assist Level: Maximal Assistance - Patient 24 - 49%     Upper Body Dressing/Undressing Upper body dressing   What is the patient wearing?: Pull over shirt    Upper body assist Assist Level: Contact Guard/Touching assist    Lower Body Dressing/Undressing Lower body dressing      What is the patient wearing?: Pants, Underwear/pull up, Incontinence brief     Lower body assist Assist for lower body dressing: Maximal Assistance - Patient 25 - 49%     Toileting Toileting    Toileting assist Assist for toileting: 2 Helpers     Transfers Chair/bed transfer  Transfers assist  Chair/bed transfer activity did not occur: Safety/medical concerns (not safe to get up)  Chair/bed transfer assist level: Maximal Assistance - Patient 25 - 49%      Locomotion Ambulation   Ambulation assist   Ambulation activity did not occur: Safety/medical concerns  Assist level: Dependent - Patient 0% Assistive device: Other (comment) (sara plus) Max distance: 18 steps   Walk 10 feet activity   Assist  Walk 10 feet activity did not occur: Safety/medical concerns        Walk 50 feet activity   Assist Walk 50 feet with 2 turns activity did not occur: Safety/medical concerns         Walk 150 feet activity   Assist Walk 150 feet activity did not occur: Safety/medical concerns         Walk 10 feet on uneven surface  activity   Assist Walk 10 feet on uneven surfaces activity did not occur: Safety/medical concerns  Wheelchair     Assist Is the patient using a wheelchair?: Yes Type of Wheelchair: Power    Wheelchair assist level: Supervision/Verbal cueing Max wheelchair distance: 300+    Wheelchair 50 feet with 2 turns activity    Assist        Assist Level: Supervision/Verbal cueing   Wheelchair 150 feet activity     Assist      Assist Level: Supervision/Verbal cueing   Blood pressure 118/70, pulse 65, temperature 97.7 F (36.5 C), resp. rate 16, height 5\' 9"  (1.753 m), weight 104.9 kg, SpO2 100%.   Medical Problem List and Plan: 1. Functional deficits secondary to debility related to septic shock/C. difficile colitis/AKI superimposed on CKD stage IV             -patient may shower with tubes/drains covered             -ELOS/Goals:  3/28, min assist PT/OT             -Continue CIR therapies including PT, OT   -Expected discharge 4/4  2.  Antithrombotics: -DVT/anticoagulation:  Pharmaceutical: Eliquis             -antiplatelet therapy: N/A   3. Pain Management: tramadol prn, Zanaflex 4 mg twice daily as needed   Biliateral OA of knees  -ordered bilateral neoprine knee sleeves --try today  -voltaren gel tid to knees  -have scheduled tramadol 50mg  at 0700 and 1200 daily.  Continue prn as well  Mild adhesive capsulitis left shoulder---  sports cream. Can use voltaren gel also. Aggressive ROM with therapy and while in bed--continue  -Will check right knee x-ray, left knee x-ray on 3/8 with advanced OA.  Will consult orthopedics for cortisone injections  -3-28: Orthopedics aspirated and injected left knee, with improvement.  Ongoing pain with right knee.  3/31 Knee fluid calcium pyrophosphate cyrystals, pseudogout?, cultures with no growth    4. Mood/Behavior/Sleep: Trazodone 50 mg nightly as needed.  Provide emotional support             -antipsychotic agents: N/A   5. Neuropsych/cognition: This patient is capable of making decisions on his own behalf.   6. Skin/Wound Care: Routine skin checks   7. Fluids/Electrolytes/Nutrition:    -3/14 po intake remains poor, albumin low, weights falling  -will ask RD for assistance. Have spoken to patient about intake  -?IV albumin (see below)  3/18 discussed with dietitian, patient recorded is eating 0% of his meals.  Appears that wife bringing in 3 meals a day and he is eating about 75%, appears to have fairly adequate intake.  3/27 eating frequent outside foods-doesn't like hospital food as much  8.  Oliguric AKI superimposed on CKD stage IV.  Baseline creatinine 4.9.  CRRT discontinued 2/25.  No current plan for long-term dialysis.  Follow-up renal services              - Per discussion with nephrology Dr. Verna Czech, keep Foley catheter in to allow accurate I's and O's through Monday; then, can remove and do DC Foley trial while continuing to document strict outputs             - Continue daily assessments of renal function per nephrology   3/8- per renal, Cr leveling out- and con't Foley- will remove Monday  -nephrology following, Cr remains in 4's.   3/14 renal function with some improvement today     -suspect lower extremity edema is related to renal function/low albumin   Appreciate  nephro input   -3/17  BUN/creatinine slightly improved 34/3.31, appreciate nephrology assistance  3/19 BUN/CR stable at 34/3.24, nephrology signing off, ok to check BMET twice a week  3/23 encouraging po this weekend. happy that he's eating food from home  3/24 BUN/CR overall stable at 40/3.26  -3/31 BUN/Cr a little higher today, IVF 47ml/hr  -4/1 Cr a little higher again, will contact nephrology  9.  C. difficile colitis.  Completed course of oral vancomycin 3/7.  DC'd IV Flagyl 3/3. Enteric precautions   3/14 stools formed this morning   3/17 discussed with ID pharmacy, patient having more frequent bowel movements.  Monitor today and if continues consider Dificid treatment  3/18 diarrhea appears to be improving, continue to monitor for now  3/19 Frequent Bms improved, continue to follow  3/21 having more frequent bowel movements again today, continue to monitor and if frequent diarrhea persists this weekend consider treatment with Dificid  3/23- mushy/lqiuid stool again overnight    -no dificid at this point. Discuss with GI first on Monday    -added fiber to regimen yesterday, will increase to bid today  3/24 mushy/ liquid BMs, about 2 a day yesterday, more other days- will discuss GI  3/25 GI recommended repeat C. difficile screening. Discussed with [facility]ol[facility], hold off on repeat screening test, he only had about 2 bowel movements a day but if continues to have frequent BMs consider retreatment with oral vancomycin for 10 days  3/26 pt reports Bms more firm, continue to monitor   3/28 patient reports BMs have been more solid, continue to monitor.  If worsening diarrhea occurs consider retreatment with vancomycin oral for 10 days  3-29: Diarrhea this a.m., no bowel movements this afternoon recorded  3-30: No BM since yesterday.  3-31 diarrhea continues to be improved overall  10.  Status post left nephrostomy tube d/t staghorn calculi. Has Foley Nephrostomy tube placed 1/29 per IR with plan for ureteric  stent placement 3/21              - Careful monitoring of nephrostomy output  -3/14 flomax has been added to improve emptying  -3/21 patient scheduled today for cystoscopy with left retrograde pyelogram, left ureteroscopy laser lithotripsy and left ureteral stent placement.     3/22- procedure appears successful thus far.    -urine clear, patient comfortable   -foley to come out on Monday per urology  3/25 Foley was removed patient continues to have urinary retention requiring IC.  Increase Flomax to 0.8 mg  3/26 intermittent blood noted with IC, if continues will restart foley  3/27 Urine no longer blood tinged, continue current regimen, consider restart foley if reoccurs  3/27 continues to require catheterization but no longer blood-tinged Addendum 3/28 foley started after traumatic cath, possible false passage, continue foley for now, f/u with urology outpatient 4/1 foley with hematuria, denies known trauma to this, will ask nursing to check bladder scan. U/A culture ordered.  Called his urologist to discuss 4/1 addendum, CT abdomen/pelvis ordered  Addendum discussed with urology, will hold eliquis for now due to bleeding, check EKG   11.  Chronic atrial fibrillation with episodes of bradycardia.  Followed by cardiology services.  Continue Eliquis.  Avoid AV nodal blocking agents   12.  Diabetes mellitus.  Hemoglobin A1c 6.2.    -tightly controlled. Dc SSI and change cbg checks to qam only   CBG (last 3)  Recent Labs    08/03/23 1146 08/03/23 1654 08/04/23 0454  GLUCAP 151* 164* 91       3-30: Recently increased.  Somewhat expected with intra-articular steroid injection.  Continue to trend.  13.  Hypotension.  ProAmatine 10 mg 3 times daily.  Monitor with increased mobility   - Normotensive on admission  3/9- BP running 100-s to 120s systolic- con't regimen  3/19 BP has been on high side, will change midodrine to 5mg  TID PRN orthostatic hypotension  3/28 BP stable, continue  current regimen  -4/1 BP Stable, continue to monitor      08/04/2023    5:33 AM 08/04/2023    5:00 AM 08/03/2023    7:58 PM  Vitals with BMI  Weight  231 lbs 4 oz   BMI  34.14   Systolic 118  127  Diastolic 70  67  Pulse 65  64    14.  Chronic anemia.  Niferex daily/Aranesp.  Monitor for any bleeding episodes   3/8- Hb 7.1 today- will order transfusion- transfusion of 1 unit pRBCs.   3/14 hgb up to 9.1  -3/17 hemoglobin down to 8.4, continue to monitor  -3/19 HGB 8.3 today, stable overall, continue to monitor    -3/27 HGB 7.7- a little lower likely due to GU procedure/hematuria, continue to monitor  3/31 HGB 7.6 today, transfuse 7 or less  Recheck tomorrow   16. Leukocytosis  3/9- Pt's WBC is 21.7- up from 16k- is afebrile- due to his recent C diff, that just finished correct dosing of ABX for, called ID for guidance on treatment- don't want to cause more Cdiff- ID consulted ---no new orders  3/10 WBC's down to 17k  --have been hovering above and below 20 for awhile. No new clinical symptoms. Nephrostomy drainage clear, yellow  3/14 wbc's down to 13k.--recheck Monday   3/17 WBC is down to 12.4  3/19 WBC 13.4 today, continue to monitor  3/24 WBC higher today 18.7, Discuss C diff treatment with GI a above 3/31 WBC still elevated 19.0, monitor for signs of infection  17. Decreased mood reported by therapy  -Consider SSRI  -3/20 patient reports his mood is okay, denies depression this morning.  Continue to monitor  18. Insomnia   -3/20 schedule trazodone and increase to 75mg  at bedtime  -3/21 insomnia improved but bowel movements at night did wake him up a few times  3/23 sleeping better when stooling isn't an issue  19. LE edema  -Check albumin/cmp with next labs. Leg elevation and compression   -Significantly improved on the left after steroid injection and effusion tap.  LOS: 25 days A FACE TO FACE EVALUATION WAS PERFORMED  Fanny Dance 08/04/2023, 1:03 PM

## 2023-08-04 NOTE — Progress Notes (Signed)
 Occupational Therapy Session Note  Patient Details  Name: Calvin Schultz MRN: [id number] Date of Birth: 1951-03-07  {CHL IP REHAB OT TIME CALCULATIONS:[id number]}   Short Term Goals: Week 4:  OT Short Term Goal 1 (Week 4): STG = LTG (d/t ELOS)  Skilled Therapeutic Interventions/Progress Updates:    Patient agreeable to participate in OT session. Reports *** pain level.   Patient participated in skilled OT session focusing on ***. Therapist facilitated/assessed/developed/educated/integrated/elicited *** in order to improve/facilitate/promote    Therapy Documentation Precautions:  Precautions Precautions: Fall Precaution/Restrictions Comments: Foley, bilateral knee pain and decreased ROM Restrictions Weight Bearing Restrictions Per Provider Order: No   Therapy/Group: Individual Therapy  Limmie Patricia, OTR/L,CBIS  Supplemental OT - [facility] and [facility] Secure Chat Preferred   08/04/2023, 4:50 PM

## 2023-08-04 NOTE — Plan of Care (Signed)
  Problem: RH Bed to Chair Transfers Goal: LTG Patient will perform bed/chair transfers w/assist (PT) Description: LTG: Patient will perform bed to chair transfers with assistance (PT). Flowsheets (Taken 08/04/2023 1004) LTG: Pt will perform Bed to Chair Transfers with assistance level: (downgraded to mod A 2/2 pt inconsistent level of assist) Moderate Assistance - Patient 50 - 74%   Problem: RH Car Transfers Goal: LTG Patient will perform car transfers with assist (PT) Description: LTG: Patient will perform car transfers with assistance (PT). Flowsheets (Taken 08/04/2023 1005) LTG: Pt will perform car transfers with assist:: Moderate Assistance - Patient 50 - 74%   Problem: RH Wheelchair Mobility Goal: LTG Patient will propel w/c in controlled environment (PT) Description: LTG: Patient will propel wheelchair in controlled environment, # of feet with assist (PT) Flowsheets (Taken 08/04/2023 1020) LTG: Pt will propel w/c in controlled environ  assist needed:: Supervision/Verbal cueing Goal: LTG Patient will propel w/c in home environment (PT) Description: LTG: Patient will propel wheelchair in home environment, # of feet with assistance (PT). Flowsheets (Taken 08/04/2023 1020) LTG: Pt will propel w/c in home environ  assist needed:: Supervision/Verbal cueing Goal: LTG Patient will propel w/c in community environment (PT) Description: LTG: Patient will propel wheelchair in community environment, # of feet with assist (PT) Flowsheets (Taken 08/04/2023 1020) LTG: Pt will propel w/c in community environ  assist needed:: Set up assist

## 2023-08-04 NOTE — Plan of Care (Signed)
  Problem: RH Bed to Chair Transfers Goal: LTG Patient will perform bed/chair transfers w/assist (PT) Description: LTG: Patient will perform bed to chair transfers with assistance (PT). Flowsheets (Taken 08/04/2023 1004) LTG: Pt will perform Bed to Chair Transfers with assistance level: (downgraded to mod A 2/2 pt inconsistent level of assist) Moderate Assistance - Patient 50 - 74%

## 2023-08-04 NOTE — Progress Notes (Signed)
 Nutrition Follow-up  DOCUMENTATION CODES:   Obesity unspecified  INTERVENTION:   Continue Magic cup TID with meals, each supplement provides 290 kcal and 9 grams of protein. Continue renal MVI daily. Wife to continue bringing in meals for patient.  NUTRITION DIAGNOSIS:   Increased nutrient needs related to acute illness (AKI superimposed on CKD IV) as evidenced by estimated needs.  Ongoing   GOAL:   Patient will meet greater than or equal to 90% of their needs  Progressing   MONITOR:   PO intake, Weight trends, Supplement acceptance  REASON FOR ASSESSMENT:   Consult Poor PO  ASSESSMENT:   73 yo male admitted with functional deficits and debility r/t septic shock, C difficile colitis, AKI superimposed on CKD stage IV. PMH includes DM, HTN, arthritis, HLD, CKD, nephrostomy tube placement 06/03/23.  Wife continues to bring in meals for patient from home. He receives magic cup supplements with meals TID. He really likes the magic cups. He will eat fruit from the hospital for some meals, but mostly just eats what his wife brings in and the magic cups. Intake of meals is 100%.   Plans for CT abdomen this afternoon d/t new L flank pain and hematuria.   Labs reviewed. Na 133 CBG range: 91-164 over the past 24 hours  Medications reviewed and include aranesp, niferex, rena-vit, psyllium, flomax. IVF: NS at 50 ml/h.   Weights: 3/07: 107.8 kg 3/15: 102.9 kg 3/22: 99.4 kg 3/29: 105 kg 4/1: 104.9 kg  Diet Order:   Diet Order             Diet renal/carb modified with fluid restriction Diet-HS Snack? Nothing; Fluid restriction: 1200 mL Fluid; Room service appropriate? Yes; Fluid consistency: Thin  Diet effective now                   EDUCATION NEEDS:   Not appropriate for education at this time  Skin:  Skin Assessment: Skin Integrity Issues: Skin Integrity Issues:: Stage II, Incisions Stage II: left & right buttocks Incisions: coccyx  Last BM:  3/31 type  6  Height:   Ht Readings from Last 1 Encounters:  07/10/23 5\' 9"  (1.753 m)    Weight:   Wt Readings from Last 1 Encounters:  08/04/23 104.9 kg    Ideal Body Weight:  72.7 kg  BMI:  Body mass index is 34.15 kg/m.  Estimated Nutritional Needs:   Kcal:  2200-2400  Protein:  110-120 g  Fluid:  >/= 2.2 L   Gabriel Rainwater RD, LDN, CNSC Contact via secure chat. If unavailable, use group chat "RD Inpatient."

## 2023-08-04 NOTE — Progress Notes (Signed)
 Physical Therapy Session Note  Patient Details  Name: Nahshon Reich MRN: [id number] Date of Birth: 1950/08/17  Today's Date: 08/04/2023 PT Missed Time: 45 Minutes Missed Time Reason: MD hold (Comment)  Short Term Goals: Week 4:  PT Short Term Goal 1 (Week 4): STG=LTG 2/2 ELOS  Skilled Therapeutic Interventions/Progress Updates:      Pt supine in bed upon arrival. Pt wife and nurse present and report MD hold for CT, and additional medical work up. Pt missed 45 min 2/2 MD hold.   Therapy Documentation Precautions:  Precautions Precautions: Fall Precaution/Restrictions Comments: Foley, bilateral knee pain and decreased ROM Restrictions Weight Bearing Restrictions Per Provider Order: No  Therapy/Group: Individual Therapy  [facility] Ambrose Finland, [place], DPT  08/04/2023, 3:46 PM

## 2023-08-04 NOTE — Progress Notes (Signed)
 [facility] Progress Note   Nephrology reconsulted today for AKI on CKD.  Briefly, 73yo M A fib, DM, A fib on eliquis, nephrolithiasis s/p recent PCN for staghorn calculus currently admitted to CIR for debility following a protracted hospitalization for C diff colitis complicated by AKI on CKD4 requiring CRRT in mid 06/2023.  His AKI was secondary to ATN (hypovolemia + ARB).  Baseline kidney function 05/2023 Cr 4.1 - 5.3.  After CRRT stopped his creatinine improved to nadir of 3.2 on 3/19 and has gradually trended up 3/27 3.6 > 3/31 4.2 > 4/1 4.45.     He had L PCN (which was placed 1/29) removed 3/21 at time of lithotripsy and ureter stent placement.  He has a foley catheter still in place and in the past few days has developed gross hematuria and L flank pain.  No bladder pain or spasms. He has no GI issues, has had stable po intake, no contrast, no NSAIDs.  No rashes.  His flank pain is bad enough currently he wishes to forgo afternoon therapy session.    Wife is bedside and helps with the history.  He has some LE edema which is actually improved as is LUE edema.    Objective Vitals:   08/03/23 1443 08/03/23 1958 08/04/23 0500 08/04/23 0533  BP: 139/80 127/67  118/70  Pulse: 65 64  65  Resp: 19 18  16   Temp: (!) 97.4 F (36.3 C) 97.6 F (36.4 C)  97.7 F (36.5 C)  TempSrc:      SpO2: 100% 100%  100%  Weight:   104.9 kg   Height:       Physical Exam General: lying flat, comfortable lying still Heart: RRR Lungs: clear Abdomen: soft Extremities: 1+ L tibial edema 1-2+ R tibial, L UE 1+ pitting at elbow GU:  foley draining frankly bloody urine, no clots noted  Additional Objective Labs: Basic Metabolic Panel: Recent Labs  Lab 07/30/23 0533 08/03/23 0521 08/04/23 0531  NA 135 132* 133*  K 4.2 4.0 4.2  CL 111 109 111  CO2 16* 12* 12*  GLUCOSE 104* 127* 98  BUN 51* 74* 76*  CREATININE 3.65* 4.22* 4.45*  CALCIUM 8.4* 8.1* 8.2*   Liver Function Tests: Recent  Labs  Lab 08/03/23 0521  AST 19  ALT 27  ALKPHOS 145*  BILITOT 0.2  PROT 5.5*  ALBUMIN 2.1*   No results for input(s): "LIPASE", "AMYLASE" in the last 168 hours. CBC: Recent Labs  Lab 07/30/23 0533 08/03/23 0521  WBC 17.9* 19.0*  HGB 7.7* 7.6*  HCT 25.2* 25.1*  MCV 89.0 87.2  PLT 331 368   Blood Culture    Component Value Date/Time   SDES FLUID 07/31/2023 1420   SPECREQUEST LEFT KNEE 07/31/2023 1420   CULT  07/31/2023 1420    NO GROWTH 3 DAYS Performed at [facility], [address] [place], [place] [address]    REPTSTATUS 08/03/2023 FINAL 07/31/2023 1420    Cardiac Enzymes: No results for input(s): "CKTOTAL", "CKMB", "CKMBINDEX", "TROPONINI" in the last 168 hours. CBG: Recent Labs  Lab 08/02/23 2029 08/03/23 0602 08/03/23 1146 08/03/23 1654 08/04/23 0613  GLUCAP 267* 122* 151* 164* 91   Iron Studies: No results for input(s): "IRON", "TIBC", "TRANSFERRIN", "FERRITIN" in the last 72 hours. @lablastinr3 @ Studies/Results: No results found. Medications:  sodium chloride 50 mL/hr at 08/04/23 1217    apixaban  5 mg Oral BID   Chlorhexidine Gluconate Cloth  6 each Topical BID  darbepoetin (ARANESP) injection - NON-DIALYSIS  40 mcg Subcutaneous Q Fri-1800   diclofenac Sodium  2 g Topical QID   iron polysaccharides  150 mg Oral Daily   multivitamin  1 tablet Oral QHS   Muscle Rub   Topical TID   psyllium  1 packet Oral BID   tamsulosin  0.8 mg Oral QPC supper   traMADol  50 mg Oral BID   traZODone  75 mg Oral QHS    Assessment/Plan:73yo M A fib, DM, A fib on eliquis, nephrolithiasis s/p recent PCN for staghorn calculus currently admitted to CIR for debility and nephrology is reconsulted for AKI on CKD.  **AKI on CKD 4:   Baseline kidney function 05/2023 Cr 4.1 - 5.3 f/b CKA.  After CRRT stopped his creatinine improved to nadir of 3.2 on 3/19 and has gradually trended up 3/27 3.6 > 3/31 4.2 > 4/1 4.45.   He is appearing euvolemic and I don't find any  hypotension, nephrotoxin exposures.  He has new L flank pain and hematuria so would proceed to CT this afternoon.  Ok to continue IVF for now.  Daily labs for now.  Strict I/Os.  Avoid hypotension and nephrotoxins.  No indications for RRT currently.   **Anemia, normocytic:  Transfuse as needed for Hb < 7.  Having acute blood loss through foley.  Eliquis has been held.   **A fib:  eliquis on hold for acute bleeding  **Orthostatic hypotension:  has been on midodrine for the last few weeks, BPs look ok currently.   **Debility:  on CIR, requesting pause in therapies this afternoon due to pain - wife to alert RN.   Will follow closely with you, please reach out with concerns.    Estill Bakes MD 08/04/2023, 1:40 PM  [facility] Pager: [phone]

## 2023-08-05 ENCOUNTER — Inpatient Hospital Stay (HOSPITAL_COMMUNITY): Admitting: Certified Registered Nurse Anesthetist

## 2023-08-05 ENCOUNTER — Encounter (HOSPITAL_COMMUNITY)
Admission: AD | Disposition: A | Discharge status: Home Health | Payer: Self-pay | Source: Intra-hospital | Attending: Physical Medicine & Rehabilitation

## 2023-08-05 ENCOUNTER — Other Ambulatory Visit: Payer: Self-pay | Admitting: Urology

## 2023-08-05 ENCOUNTER — Other Ambulatory Visit: Payer: Self-pay

## 2023-08-05 ENCOUNTER — Inpatient Hospital Stay (HOSPITAL_COMMUNITY)

## 2023-08-05 ENCOUNTER — Inpatient Hospital Stay: Admit: 2023-08-05 | Admitting: Urology

## 2023-08-05 ENCOUNTER — Encounter (HOSPITAL_COMMUNITY): Payer: Self-pay | Admitting: Physical Medicine & Rehabilitation

## 2023-08-05 DIAGNOSIS — Z8619 Personal history of other infectious and parasitic diseases: Secondary | ICD-10-CM

## 2023-08-05 DIAGNOSIS — N201 Calculus of ureter: Secondary | ICD-10-CM | POA: Diagnosis present

## 2023-08-05 DIAGNOSIS — I482 Chronic atrial fibrillation, unspecified: Secondary | ICD-10-CM

## 2023-08-05 DIAGNOSIS — L8990 Pressure ulcer of unspecified site, unspecified stage: Secondary | ICD-10-CM | POA: Diagnosis present

## 2023-08-05 DIAGNOSIS — I10 Essential (primary) hypertension: Secondary | ICD-10-CM

## 2023-08-05 DIAGNOSIS — E119 Type 2 diabetes mellitus without complications: Secondary | ICD-10-CM

## 2023-08-05 DIAGNOSIS — N189 Chronic kidney disease, unspecified: Secondary | ICD-10-CM

## 2023-08-05 DIAGNOSIS — D72829 Elevated white blood cell count, unspecified: Secondary | ICD-10-CM | POA: Diagnosis present

## 2023-08-05 DIAGNOSIS — N202 Calculus of kidney with calculus of ureter: Secondary | ICD-10-CM

## 2023-08-05 DIAGNOSIS — R310 Gross hematuria: Secondary | ICD-10-CM

## 2023-08-05 DIAGNOSIS — E872 Acidosis, unspecified: Secondary | ICD-10-CM

## 2023-08-05 DIAGNOSIS — E118 Type 2 diabetes mellitus with unspecified complications: Secondary | ICD-10-CM | POA: Diagnosis present

## 2023-08-05 HISTORY — PX: CYSTOSCOPY W/ URETERAL STENT PLACEMENT: SHX1429

## 2023-08-05 LAB — PREPARE RBC (CROSSMATCH)

## 2023-08-05 LAB — URINE CULTURE: Culture: >=100000 — AB

## 2023-08-05 LAB — CBC
HCT: 23.8 % — ABNORMAL LOW (ref 39.0–52.0)
Hemoglobin: 7.3 g/dL — ABNORMAL LOW (ref 13.0–17.0)
MCH: 26.8 pg (ref 26.0–34.0)
MCHC: 30.7 g/dL (ref 30.0–36.0)
MCV: 87.5 fL (ref 80.0–100.0)
Platelets: 372 10*3/uL (ref 150–400)
RBC: 2.72 MIL/uL — ABNORMAL LOW (ref 4.22–5.81)
RDW: 16.1 % — ABNORMAL HIGH (ref 11.5–15.5)
WBC: 24.4 10*3/uL — ABNORMAL HIGH (ref 4.0–10.5)
nRBC: 0.1 % (ref 0.0–0.2)

## 2023-08-05 LAB — RENAL FUNCTION PANEL
Albumin: 2 g/dL — ABNORMAL LOW (ref 3.5–5.0)
Anion gap: 11 (ref 5–15)
BUN: 81 mg/dL — ABNORMAL HIGH (ref 8–23)
CO2: 11 mmol/L — ABNORMAL LOW (ref 22–32)
Calcium: 8.1 mg/dL — ABNORMAL LOW (ref 8.9–10.3)
Chloride: 110 mmol/L (ref 98–111)
Creatinine, Ser: 4.69 mg/dL — ABNORMAL HIGH (ref 0.61–1.24)
GFR, Estimated: 13 mL/min — ABNORMAL LOW (ref >60)
Glucose, Bld: 87 mg/dL (ref 70–99)
Phosphorus: 7.4 mg/dL — ABNORMAL HIGH (ref 2.5–4.6)
Potassium: 4.3 mmol/L (ref 3.5–5.1)
Sodium: 132 mmol/L — ABNORMAL LOW (ref 135–145)

## 2023-08-05 LAB — GLUCOSE, CAPILLARY
Glucose-Capillary: 80 mg/dL (ref 70–99)
Glucose-Capillary: 93 mg/dL (ref 70–99)
Glucose-Capillary: 70 mg/dL (ref 70–99)
Glucose-Capillary: 70 mg/dL (ref 70–99)
Glucose-Capillary: 80 mg/dL (ref 70–99)
Glucose-Capillary: 92 mg/dL (ref 70–99)
Glucose-Capillary: 148 mg/dL — ABNORMAL HIGH (ref 70–99)

## 2023-08-05 SURGERY — CYSTOSCOPY, WITH RETROGRADE PYELOGRAM AND URETERAL STENT INSERTION
Anesthesia: General | Laterality: Right

## 2023-08-05 MED ORDER — PHENYLEPHRINE HCL-NACL 20-0.9 MG/250ML-% IV SOLN
INTRAVENOUS | Status: AC
Start: 2023-08-05 — End: ?
  Filled 2023-08-05: qty 250

## 2023-08-05 MED ORDER — FENTANYL CITRATE (PF) 250 MCG/5ML IJ SOLN
INTRAMUSCULAR | Status: AC
  Filled 2023-08-05: qty 5

## 2023-08-05 MED ORDER — PSYLLIUM 95 % PO PACK: 1.0000 | PACK | Freq: Two times a day (BID) | ORAL | Status: DC

## 2023-08-05 MED ORDER — ACETAMINOPHEN 325 MG PO TABS: 650.0000 mg | ORAL_TABLET | ORAL | Status: DC | PRN

## 2023-08-05 MED ORDER — SODIUM CHLORIDE 0.9% IV SOLUTION: Freq: Once | INTRAVENOUS | Status: AC

## 2023-08-05 MED ORDER — CHLORHEXIDINE GLUCONATE 0.12 % MT SOLN
OROMUCOSAL | Status: AC
  Administered 2023-08-05: 15 mL via OROMUCOSAL
  Filled 2023-08-05: qty 15

## 2023-08-05 MED ORDER — HYDROCODONE-ACETAMINOPHEN 5-325 MG PO TABS: 1.0000 | ORAL_TABLET | Freq: Four times a day (QID) | ORAL | 0 refills | Status: DC | PRN

## 2023-08-05 MED ORDER — RENA-VITE PO TABS: 1.0000 | ORAL_TABLET | Freq: Every day | ORAL | Status: DC

## 2023-08-05 MED ORDER — SODIUM CHLORIDE 0.9 % IV SOLN: INTRAVENOUS | Status: DC

## 2023-08-05 MED ORDER — TRAMADOL HCL 50 MG PO TABS: 50.0000 mg | ORAL_TABLET | Freq: Two times a day (BID) | ORAL | Status: DC

## 2023-08-05 MED ORDER — IOHEXOL 300 MG/ML  SOLN
INTRAMUSCULAR | Status: DC | PRN
  Administered 2023-08-05: 100 mL via URETHRAL

## 2023-08-05 MED ORDER — FENTANYL CITRATE (PF) 100 MCG/2ML IJ SOLN
25.0000 ug | INTRAMUSCULAR | Status: DC | PRN
Start: 2023-08-05 — End: 2023-08-05

## 2023-08-05 MED ORDER — ONDANSETRON HCL 4 MG/2ML IJ SOLN
INTRAMUSCULAR | Status: DC | PRN
  Administered 2023-08-05: 4 mg via INTRAVENOUS

## 2023-08-05 MED ORDER — FENTANYL CITRATE (PF) 250 MCG/5ML IJ SOLN
INTRAMUSCULAR | Status: DC | PRN
  Administered 2023-08-05: 50 ug via INTRAVENOUS

## 2023-08-05 MED ORDER — SODIUM CHLORIDE 0.9 % IV SOLN
2.0000 g | Freq: Once | INTRAVENOUS | Status: AC
  Administered 2023-08-05: 2 g via INTRAVENOUS
  Filled 2023-08-05: qty 20

## 2023-08-05 MED ORDER — TIZANIDINE HCL 4 MG PO TABS: 4.0000 mg | ORAL_TABLET | Freq: Two times a day (BID) | ORAL | Status: DC | PRN

## 2023-08-05 MED ORDER — DEXAMETHASONE SODIUM PHOSPHATE 10 MG/ML IJ SOLN
INTRAMUSCULAR | Status: DC | PRN
  Administered 2023-08-05: 5 mg via INTRAVENOUS

## 2023-08-05 MED ORDER — TRAZODONE HCL 150 MG PO TABS: 75.0000 mg | ORAL_TABLET | Freq: Every day | ORAL | Status: DC

## 2023-08-05 MED ORDER — ONDANSETRON HCL 4 MG/2ML IJ SOLN
INTRAMUSCULAR | Status: AC
  Filled 2023-08-05: qty 2

## 2023-08-05 MED ORDER — CHLORHEXIDINE GLUCONATE 0.12 % MT SOLN: 15.0000 mL | Freq: Once | OROMUCOSAL | Status: AC

## 2023-08-05 MED ORDER — LIDOCAINE 2% (20 MG/ML) 5 ML SYRINGE
INTRAMUSCULAR | Status: DC | PRN
  Administered 2023-08-05: 100 mg via INTRAVENOUS

## 2023-08-05 MED ORDER — SODIUM CHLORIDE 0.9 % IV SOLN: 12.5000 mg | INTRAVENOUS | Status: DC | PRN

## 2023-08-05 MED ORDER — EPHEDRINE SULFATE-NACL 50-0.9 MG/10ML-% IV SOSY
PREFILLED_SYRINGE | INTRAVENOUS | Status: DC | PRN
Start: 2023-08-05 — End: 2023-08-05
  Administered 2023-08-05 (×3): 5 mg via INTRAVENOUS

## 2023-08-05 MED ORDER — STERILE WATER FOR INJECTION IV SOLN
INTRAVENOUS | Status: DC
  Filled 2023-08-05: qty 1000

## 2023-08-05 MED ORDER — STERILE WATER FOR IRRIGATION IR SOLN
Status: DC | PRN
  Administered 2023-08-05: 1000 mL via INTRAVESICAL

## 2023-08-05 MED ORDER — DICLOFENAC SODIUM 1 % EX GEL
2.0000 g | Freq: Four times a day (QID) | CUTANEOUS | Status: AC
Start: 2023-08-05 — End: ?

## 2023-08-05 MED ORDER — ORAL CARE MOUTH RINSE: 15.0000 mL | Freq: Once | OROMUCOSAL | Status: AC

## 2023-08-05 MED ORDER — CEPHALEXIN 250 MG PO CAPS: 250.0000 mg | ORAL_CAPSULE | Freq: Every day | ORAL | Status: DC

## 2023-08-05 MED ORDER — PHENYLEPHRINE HCL-NACL 20-0.9 MG/250ML-% IV SOLN
INTRAVENOUS | Status: DC | PRN
  Administered 2023-08-05: 50 ug/min via INTRAVENOUS

## 2023-08-05 MED ORDER — SODIUM CHLORIDE 0.9 % IR SOLN
Status: DC | PRN
  Administered 2023-08-05: 3000 mL via INTRAVESICAL

## 2023-08-05 MED ORDER — TAMSULOSIN HCL 0.4 MG PO CAPS: 0.8000 mg | ORAL_CAPSULE | Freq: Every day | ORAL | Status: DC

## 2023-08-05 MED ORDER — LIDOCAINE 2% (20 MG/ML) 5 ML SYRINGE
INTRAMUSCULAR | Status: AC
  Filled 2023-08-05: qty 5

## 2023-08-05 MED ORDER — SODIUM BICARBONATE 8.4 % IV SOLN
INTRAVENOUS | Status: DC
  Filled 2023-08-05: qty 1000

## 2023-08-05 MED ORDER — SODIUM CHLORIDE 0.9 % IV SOLN
50.0000 mL | INTRAVENOUS | Status: DC
Start: 2023-08-05 — End: 2023-08-20

## 2023-08-05 MED ORDER — INSULIN ASPART 100 UNIT/ML IJ SOLN: 0.0000 [IU] | INTRAMUSCULAR | Status: DC | PRN

## 2023-08-05 MED ORDER — PROPOFOL 10 MG/ML IV BOLUS
INTRAVENOUS | Status: DC | PRN
  Administered 2023-08-05: 200 mg via INTRAVENOUS

## 2023-08-05 MED ORDER — MIDODRINE HCL 5 MG PO TABS: 5.0000 mg | ORAL_TABLET | Freq: Three times a day (TID) | ORAL | Status: DC | PRN

## 2023-08-05 MED ORDER — PROPOFOL 10 MG/ML IV BOLUS
INTRAVENOUS | Status: AC
  Filled 2023-08-05: qty 20

## 2023-08-05 SURGICAL SUPPLY — 21 items
BAG URINE DRAIN 2000ML AR STRL (UROLOGICAL SUPPLIES) ×1 IMPLANT
BAG URO CATCHER STRL LF (MISCELLANEOUS) ×1 IMPLANT
CATH FOLEY 2WAY SLVR 30CC 20FR (CATHETERS) IMPLANT
CATH URET 5FR 28IN OPEN ENDED (CATHETERS) IMPLANT
CATH URETHERAL OPEN END 7FR (CATHETERS) IMPLANT
CATH URETL OPEN 5X70 (CATHETERS) IMPLANT
CATH URTH STD 16FR FL 2W DRN (CATHETERS) IMPLANT
GLOVE BIO SURGEON STRL SZ7.5 (GLOVE) ×1 IMPLANT
GOWN STRL REUS W/ TWL LRG LVL3 (GOWN DISPOSABLE) ×1 IMPLANT
GOWN STRL REUS W/ TWL XL LVL3 (GOWN DISPOSABLE) ×1 IMPLANT
GUIDEWIRE ANG ZIPWIRE 038X150 (WIRE) IMPLANT
GUIDEWIRE STR DUAL SENSOR (WIRE) IMPLANT
KIT TURNOVER KIT B (KITS) ×1 IMPLANT
MANIFOLD NEPTUNE II (INSTRUMENTS) ×1 IMPLANT
NS IRRIG 1000ML POUR BTL (IV SOLUTION) ×1 IMPLANT
PACK CYSTO (CUSTOM PROCEDURE TRAY) ×1 IMPLANT
SOL .9 NS 3000ML IRR UROMATIC (IV SOLUTION) ×2 IMPLANT
STENT CONTOUR 6FRX24X.038 (STENTS) IMPLANT
STENT CONTOUR 6FRX26X.038 (STENTS) IMPLANT
STENT CONTOUR 7FRX28X.038 (STENTS) IMPLANT
TUBE CONNECTING 12X1/4 (SUCTIONS) IMPLANT

## 2023-08-05 NOTE — Progress Notes (Addendum)
 [facility] Progress Note   Subjective:  continued flank pain - CT done this AM showing BL hydro and blood in collecting systems; D/w primary bedside this AM and he confirmed he's reaching out to urology now. Wife bedside.  PO intake is poor.   Objective Vitals:   08/04/23 1442 08/04/23 2039 08/05/23 0500 08/05/23 0509  BP: 139/76 135/69  106/60  Pulse: 64 64  64  Resp: 18 18  16   Temp: (!) 97.4 F (36.3 C) 97.6 F (36.4 C)  97.7 F (36.5 C)  TempSrc:  Oral  Oral  SpO2: 100% 100%  98%  Weight:   105.3 kg   Height:       Physical Exam General: lying flat, comfortable lying still Heart: RRR Lungs: clear Abdomen: soft Extremities: 1+ L tibial edema 1-2+ R tibial, L UE 1+ pitting at elbow GU:  foley draining frankly bloody urine, no clots noted  Additional Objective Labs: Basic Metabolic Panel: Recent Labs  Lab 08/03/23 0521 08/04/23 0531 08/05/23 0916  NA 132* 133* 132*  K 4.0 4.2 4.3  CL 109 111 110  CO2 12* 12* 11*  GLUCOSE 127* 98 87  BUN 74* 76* 81*  CREATININE 4.22* 4.45* 4.69*  CALCIUM 8.1* 8.2* 8.1*  PHOS  --   --  7.4*   Liver Function Tests: Recent Labs  Lab 08/03/23 0521 08/05/23 0916  AST 19  --   ALT 27  --   ALKPHOS 145*  --   BILITOT 0.2  --   PROT 5.5*  --   ALBUMIN 2.1* 2.0*   No results for input(s): "LIPASE", "AMYLASE" in the last 168 hours. CBC: Recent Labs  Lab 07/30/23 0533 08/03/23 0521 08/04/23 1939 08/05/23 0527  WBC 17.9* 19.0* 19.1* 24.4*  HGB 7.7* 7.6* 7.7* 7.3*  HCT 25.2* 25.1* 25.2* 23.8*  MCV 89.0 87.2 86.6 87.5  PLT 331 368 350 372   Blood Culture    Component Value Date/Time   SDES FLUID 07/31/2023 1420   SPECREQUEST LEFT KNEE 07/31/2023 1420   CULT  07/31/2023 1420    NO GROWTH 3 DAYS Performed at [facility], [address] [place], [place] [address]    REPTSTATUS 08/03/2023 FINAL 07/31/2023 1420    Cardiac Enzymes: No results for input(s): "CKTOTAL", "CKMB", "CKMBINDEX",  "TROPONINI" in the last 168 hours. CBG: Recent Labs  Lab 08/03/23 1146 08/03/23 1654 08/04/23 0613 08/05/23 0556 08/05/23 1120  GLUCAP 151* 164* 91 80 93   Iron Studies: No results for input(s): "IRON", "TIBC", "TRANSFERRIN", "FERRITIN" in the last 72 hours. @lablastinr3 @ Studies/Results: CT ABDOMEN PELVIS WO CONTRAST Result Date: 08/05/2023 CLINICAL DATA:  73 year old male with gross hematuria. No nephrolithiasis. Left nephrostomy in February. EXAM: CT ABDOMEN AND PELVIS WITHOUT CONTRAST TECHNIQUE: Multidetector CT imaging of the abdomen and pelvis was performed following the standard protocol without IV contrast. RADIATION DOSE REDUCTION: This exam was performed according to the departmental dose-optimization program which includes automated exposure control, adjustment of the mA and/or kV according to patient size and/or use of iterative reconstruction technique. COMPARISON:  Noncontrast CT Abdomen and Pelvis 06/26/2023. FINDINGS: Lower chest: Stable cardiomegaly. No pericardial effusion. But small volume simple fluid density right pleural effusion is new since February. Contralateral left lung base fibrothorax and pleural thickening appears unchanged. Hepatobiliary: Cholelithiasis. Trace new perihepatic free fluid with simple fluid density. Mildly nodular liver contour again noted. No discrete liver lesion in the absence of contrast. Pancreas: Atrophied. Spleen: Stable and within normal limits. Adrenals/Urinary  Tract: Adrenal glands remain within normal limits. Left side percutaneous nephrostomy seen in February has been removed but a left double-J ureteral stent is in place with appropriate positioning. Superimposed developing staghorn calculus in the left midpole renal calices is noted and there is moderate increased left hydronephrosis with hyperdense fluid within the dilated collecting system (series 6, image 41). Chronic but increased perinephric soft tissue stranding. No calculus identified  along the course of the double-J stent. Contralateral new moderate to severe right hydronephrosis and severe right hydroureter (up to 2 cm diameter). And there is hyperdense fluid, layering hematocrit also within the dilated right renal collecting system and pelvis (series 3, image 39). Increased right pararenal space inflammation and stranding. The right ureter remains dilated to the level of the pelvic inlet where a large obstructing 7-8 mm diameter calculus is noted on series 3, image 70 and coronal image 53. Distal to that the right ureter is decompressed to the bladder. Foley catheter and distal double-J stent are present within the bladder. Small volume of gas within the bladder. Bladder wall is mildly thickened and indistinct. Stomach/Bowel: Increased large bowel retained stool since February, mild-to-moderate. No dilated large or small bowel loops. Evidence that the appendix remains diminutive and normal on series 3, image 59. Small volume retained fluid in the stomach. No pneumoperitoneum. Bilateral retroperitoneal, pericolic gutter inflammatory stranding which probably is emanating from the renal spaces. This is confluent in the left lower quadrant series 3, image 72, but adjacent bowel seems unaffected. Vascular/Lymphatic: Aortoiliac calcified atherosclerosis. Normal caliber abdominal aorta. Vascular patency is not evaluated in the absence of IV contrast. No lymphadenopathy identified. Reproductive: Urethral catheter in place. Other: Mild to moderate presacral edema. Musculoskeletal: Moderate to severe new bilateral body wall, flank subcutaneous edema. Widespread lower thoracic through upper lumbar spinal ankylosis from bridging endplate osteophytes. No acute osseous abnormality identified. IMPRESSION: 1. Bilateral Acute Obstructive Uropathy with evidence of bilateral collecting system Hematuria. Large 7-8 mm diameter obstructing calculus of the right ureter at the pelvic inlet. New left hydronephrosis  despite normally positioned left double-J ureteral stent. Left nephrostomy tube has been removed since February. Underlying left renal staghorn calculus. Associated bilateral pararenal space inflammation tracking in the retroperitoneum to the pelvis. 2. Foley catheter and distal double-J stent within the Urinary Bladder which appears mildly inflamed. 3. Superimposed new Anasarca: Body wall edema, trace perihepatic ascites (see #4), trace layering right pleural effusion. 4. Nodular liver contour raising the possibility of Cirrhosis. Chronic cholelithiasis. 5. Cardiomegaly. Left lung base fibrothorax. Aortic Atherosclerosis (ICD10-I70.0). Electronically Signed   By: Odessa Fleming M.D.   On: 08/05/2023 06:36   Medications:    cephALEXin  250 mg Oral Daily   Chlorhexidine Gluconate Cloth  6 each Topical BID   darbepoetin (ARANESP) injection - NON-DIALYSIS  40 mcg Subcutaneous Q Fri-1800   diclofenac Sodium  2 g Topical QID   iron polysaccharides  150 mg Oral Daily   multivitamin  1 tablet Oral QHS   Muscle Rub   Topical TID   psyllium  1 packet Oral BID   tamsulosin  0.8 mg Oral QPC supper   traMADol  50 mg Oral BID   traZODone  75 mg Oral QHS    Assessment/Plan:72yo M A fib, DM, A fib on eliquis, nephrolithiasis s/p recent PCN for staghorn calculus currently admitted to CIR for debility and nephrology is reconsulted for AKI on CKD.  **AKI on CKD 4:   Baseline kidney function 05/2023 Cr 4.1 - 5.3 f/b CKA.  After CRRT stopped his creatinine improved to nadir of 3.2 on 3/19 and has gradually trended up 3/27 3.6 > 3/31 4.2 > 4/1 4.45 > 4/2 4.7.   He is appearing euvolemic and I don't find any hypotension, nephrotoxin exposures.  He has new L flank pain and hematuria --> CT showing bleeding and obstruction.  Urology to be involved.  Eliquis stopped yesterday.  Ok to continue IVF for now in light of poor po intake.  Daily labs for now.  Strict I/Os.  Avoid hypotension and nephrotoxins.  No indications for RRT  currently.   **Anemia, normocytic:  Transfuse as needed for Hb < 7.  Having acute blood loss through foley but H/H stable.  Eliquis has been held.   **Metabolic acidosis:  worsening in context of AKI; switch to bicarb gtt IVF.   **A fib:  eliquis on hold for acute bleeding  **Orthostatic hypotension:  has been on midodrine for the last few weeks, BPs look ok currently.   **Debility:  on CIR, therapies on hold pending medical issues.   Will follow closely with you, please reach out with concerns.   Estill Bakes MD 08/05/2023, 11:28 AM  [facility] Pager: [phone]

## 2023-08-05 NOTE — Progress Notes (Signed)
 At beginning of shift patient was in bed nauseous and had recently vomited.  He reported his knee pain was well controlled at this time.  Order was obtained from Harvel Ricks PA for IV zofran. Pt was medicated and repositioned after being cleaned from BM. He asked when he would have CT done. I explained day nurse had spoke with radiology and they reported seeing ER patients and they would do his CT once they have caught up. He verbalized understanding.  IV zofran was effective and pt nausea/vomiting resolved. He did request PRN norco around 0000 for knee pain. Medication was effective and he appeared to be sleeping at followup. No other concerns at this time.

## 2023-08-05 NOTE — Progress Notes (Signed)
 Patient slept well this shift. Pain was controlled with PRN norco. He continues to have hematuria. NS infusing at 50cc/hr. CT contacted and they will be coming to get patient soon for ABD/pelvis CT. He has not had any further nausea or vomiting since last night. Vitals stable at this time.

## 2023-08-05 NOTE — Progress Notes (Signed)
 PROGRESS NOTE   Subjective/Complaints: Flank pain improved today. Continues to have hematuria. Feel tired today.  Discussed CT scan results with urology, going to OR for procedure today.   ROS: Patient denies fever, new vision changes, dizziness, shortness of breath or chest pain, headache, or mood change. Diarrhea- improved Knee pain-left improved, right ongoing Hematuria + Nausea improving + fatigue     Objective:   CT ABDOMEN PELVIS WO CONTRAST Result Date: 08/05/2023 CLINICAL DATA:  73 year old male with gross hematuria. No nephrolithiasis. Left nephrostomy in February. EXAM: CT ABDOMEN AND PELVIS WITHOUT CONTRAST TECHNIQUE: Multidetector CT imaging of the abdomen and pelvis was performed following the standard protocol without IV contrast. RADIATION DOSE REDUCTION: This exam was performed according to the departmental dose-optimization program which includes automated exposure control, adjustment of the mA and/or kV according to patient size and/or use of iterative reconstruction technique. COMPARISON:  Noncontrast CT Abdomen and Pelvis 06/26/2023. FINDINGS: Lower chest: Stable cardiomegaly. No pericardial effusion. But small volume simple fluid density right pleural effusion is new since February. Contralateral left lung base fibrothorax and pleural thickening appears unchanged. Hepatobiliary: Cholelithiasis. Trace new perihepatic free fluid with simple fluid density. Mildly nodular liver contour again noted. No discrete liver lesion in the absence of contrast. Pancreas: Atrophied. Spleen: Stable and within normal limits. Adrenals/Urinary Tract: Adrenal glands remain within normal limits. Left side percutaneous nephrostomy seen in February has been removed but a left double-J ureteral stent is in place with appropriate positioning. Superimposed developing staghorn calculus in the left midpole renal calices is noted and there is moderate  increased left hydronephrosis with hyperdense fluid within the dilated collecting system (series 6, image 41). Chronic but increased perinephric soft tissue stranding. No calculus identified along the course of the double-J stent. Contralateral new moderate to severe right hydronephrosis and severe right hydroureter (up to 2 cm diameter). And there is hyperdense fluid, layering hematocrit also within the dilated right renal collecting system and pelvis (series 3, image 39). Increased right pararenal space inflammation and stranding. The right ureter remains dilated to the level of the pelvic inlet where a large obstructing 7-8 mm diameter calculus is noted on series 3, image 70 and coronal image 53. Distal to that the right ureter is decompressed to the bladder. Foley catheter and distal double-J stent are present within the bladder. Small volume of gas within the bladder. Bladder wall is mildly thickened and indistinct. Stomach/Bowel: Increased large bowel retained stool since February, mild-to-moderate. No dilated large or small bowel loops. Evidence that the appendix remains diminutive and normal on series 3, image 59. Small volume retained fluid in the stomach. No pneumoperitoneum. Bilateral retroperitoneal, pericolic gutter inflammatory stranding which probably is emanating from the renal spaces. This is confluent in the left lower quadrant series 3, image 72, but adjacent bowel seems unaffected. Vascular/Lymphatic: Aortoiliac calcified atherosclerosis. Normal caliber abdominal aorta. Vascular patency is not evaluated in the absence of IV contrast. No lymphadenopathy identified. Reproductive: Urethral catheter in place. Other: Mild to moderate presacral edema. Musculoskeletal: Moderate to severe new bilateral body wall, flank subcutaneous edema. Widespread lower thoracic through upper lumbar spinal ankylosis from bridging endplate osteophytes. No acute osseous abnormality identified. IMPRESSION: 1.  Bilateral  Acute Obstructive Uropathy with evidence of bilateral collecting system Hematuria. Large 7-8 mm diameter obstructing calculus of the right ureter at the pelvic inlet. New left hydronephrosis despite normally positioned left double-J ureteral stent. Left nephrostomy tube has been removed since February. Underlying left renal staghorn calculus. Associated bilateral pararenal space inflammation tracking in the retroperitoneum to the pelvis. 2. Foley catheter and distal double-J stent within the Urinary Bladder which appears mildly inflamed. 3. Superimposed new Anasarca: Body wall edema, trace perihepatic ascites (see #4), trace layering right pleural effusion. 4. Nodular liver contour raising the possibility of Cirrhosis. Chronic cholelithiasis. 5. Cardiomegaly. Left lung base fibrothorax. Aortic Atherosclerosis (ICD10-I70.0). Electronically Signed   By: Odessa Fleming M.D.   On: 08/05/2023 06:36      Recent Labs    08/04/23 1939 08/05/23 0527  WBC 19.1* 24.4*  HGB 7.7* 7.3*  HCT 25.2* 23.8*  PLT 350 372     Recent Labs    08/04/23 0531 08/05/23 0916  NA 133* 132*  K 4.2 4.3  CL 111 110  CO2 12* 11*  GLUCOSE 98 87  BUN 76* 81*  CREATININE 4.45* 4.69*  CALCIUM 8.2* 8.1*      Intake/Output Summary (Last 24 hours) at 08/05/2023 1204 Last data filed at 08/05/2023 0800 Gross per 24 hour  Intake 1664.62 ml  Output 1500 ml  Net 164.62 ml     Pressure Injury 07/10/23 Buttocks Right;Mid;Upper Stage 2 -  Partial thickness loss of dermis presenting as a shallow open injury with a red, pink wound bed without slough. Pink opened stage 2 right upper buttock (Active)  07/10/23 1608  Location: Buttocks  Location Orientation: Right;Mid;Upper  Staging: Stage 2 -  Partial thickness loss of dermis presenting as a shallow open injury with a red, pink wound bed without slough.  Wound Description (Comments): Pink opened stage 2 right upper buttock  Present on Admission: Yes     Pressure Injury 07/10/23  Buttocks Right;Mid;Upper Stage 2 -  Partial thickness loss of dermis presenting as a shallow open injury with a red, pink wound bed without slough. Pink stage 2 directly beside the right upper stage 2 (Active)  07/10/23 1609  Location: Buttocks  Location Orientation: Right;Mid;Upper  Staging: Stage 2 -  Partial thickness loss of dermis presenting as a shallow open injury with a red, pink wound bed without slough.  Wound Description (Comments): Pink stage 2 directly beside the right upper stage 2  Present on Admission: Yes     Pressure Injury 07/10/23 Buttocks Right;Mid;Lower Stage 2 -  Partial thickness loss of dermis presenting as a shallow open injury with a red, pink wound bed without slough. Pink stage 2 to right buttock below the upper stage 2 (Active)  07/10/23 1610  Location: Buttocks  Location Orientation: Right;Mid;Lower  Staging: Stage 2 -  Partial thickness loss of dermis presenting as a shallow open injury with a red, pink wound bed without slough.  Wound Description (Comments): Pink stage 2 to right buttock below the upper stage 2  Present on Admission: Yes     Pressure Injury 07/10/23 Buttocks Left;Mid;Lower Stage 2 -  Partial thickness loss of dermis presenting as a shallow open injury with a red, pink wound bed without slough. Pink stage 2 to left buttock (Active)  07/10/23 1611  Location: Buttocks  Location Orientation: Left;Mid;Lower  Staging: Stage 2 -  Partial thickness loss of dermis presenting as a shallow open injury with a red, pink wound bed without slough.  Wound Description (Comments): Pink stage 2 to left buttock  Present on Admission: Yes    Physical Exam: Vital Signs Blood pressure 120/60, pulse 64, temperature 97.9 F (36.6 C), temperature source Oral, resp. rate 18, height 5\' 9"  (1.753 m), weight 105.3 kg, SpO2 100%.  Constitutional: No distress . Vital signs reviewed.  Sitting in bed. Appears comfortable HEENT: NCAT, EOMI, oral membranes moist Neck:  supple Cardiovascular: RRR without murmur. No JVD    Respiratory/Chest: CTA Bilaterally without wheezes or rales. Normal effort    GI/Abdomen: BS +, non-tender, non-distended Ext: no clubbing, cyanosis, + 2-3 + B/L LE and 2+ LUE edema increased from yesterday GU: Small amount of Bloody urine   Psych: pleasant and cooperative Skin: No evidence of breakdown, no evidence of rash Neurologic: Cranial nerves II through XII grossly intact, motor strength is 4/5 in rght 3- left  deltoid, bicep, tricep, grip,3- RIght 4- left  hip flexor, knee extensors, 4/4 B ankle dorsiflexor and plantar flexor.  C/W today's exam 08/05/2023.    -Buttocks MASD--not examined today     MSK: Bilateral knee effusions ongoing Left shoulder tight in ER/IR/F/E        Unchanged 3-31    Assessment/Plan: 1. Functional deficits which require 3+ hours per day of interdisciplinary therapy in a comprehensive inpatient rehab setting. Physiatrist is providing close team supervision and 24 hour management of active medical problems listed below. Physiatrist and rehab team continue to assess barriers to discharge/monitor patient progress toward functional and medical goals  Care Tool:  Bathing  Bathing activity did not occur:  (patient completed a simulated task at bed LOF) Body parts bathed by patient: Right arm, Left arm, Chest, Abdomen, Front perineal area, Right upper leg, Left upper leg, Face   Body parts bathed by helper: Buttocks, Left lower leg, Right lower leg     Bathing assist Assist Level: Maximal Assistance - Patient 24 - 49%     Upper Body Dressing/Undressing Upper body dressing   What is the patient wearing?: Pull over shirt    Upper body assist Assist Level: Contact Guard/Touching assist    Lower Body Dressing/Undressing Lower body dressing      What is the patient wearing?: Pants, Underwear/pull up, Incontinence brief     Lower body assist Assist for lower body dressing: Maximal Assistance -  Patient 25 - 49%     Toileting Toileting    Toileting assist Assist for toileting: 2 Helpers     Transfers Chair/bed transfer  Transfers assist  Chair/bed transfer activity did not occur: Safety/medical concerns (not safe to get up)  Chair/bed transfer assist level: Maximal Assistance - Patient 25 - 49%     Locomotion Ambulation   Ambulation assist   Ambulation activity did not occur: Safety/medical concerns  Assist level: Dependent - Patient 0% Assistive device: Other (comment) (sara plus) Max distance: 18 steps   Walk 10 feet activity   Assist  Walk 10 feet activity did not occur: Safety/medical concerns        Walk 50 feet activity   Assist Walk 50 feet with 2 turns activity did not occur: Safety/medical concerns         Walk 150 feet activity   Assist Walk 150 feet activity did not occur: Safety/medical concerns         Walk 10 feet on uneven surface  activity   Assist Walk 10 feet on uneven surfaces activity did not occur: Safety/medical concerns  Wheelchair     Assist Is the patient using a wheelchair?: Yes Type of Wheelchair: Power    Wheelchair assist level: Supervision/Verbal cueing Max wheelchair distance: 300+    Wheelchair 50 feet with 2 turns activity    Assist        Assist Level: Supervision/Verbal cueing   Wheelchair 150 feet activity     Assist      Assist Level: Supervision/Verbal cueing   Blood pressure 120/60, pulse 64, temperature 97.9 F (36.6 C), temperature source Oral, resp. rate 18, height 5\' 9"  (1.753 m), weight 105.3 kg, SpO2 100%.   Medical Problem List and Plan: 1. Functional deficits secondary to debility related to septic shock/C. difficile colitis/AKI superimposed on CKD stage IV             -patient may shower with tubes/drains covered             -ELOS/Goals:  3/28, min assist PT/OT             -Continue CIR therapies including PT, OT   -Team conference today please  see physician documentation under team conference tab, met with team  to discuss problems,progress, and goals. Formulized individual treatment plan based on medical history, underlying problem and comorbidities.    2.  Antithrombotics: -DVT/anticoagulation:  Pharmaceutical: Eliquis             -antiplatelet therapy: N/A   3. Pain Management: tramadol prn, Zanaflex 4 mg twice daily as needed   Biliateral OA of knees  -ordered bilateral neoprine knee sleeves --try today  -voltaren gel tid to knees  -have scheduled tramadol 50mg  at 0700 and 1200 daily. Continue prn as well  Mild adhesive capsulitis left shoulder---  sports cream. Can use voltaren gel also. Aggressive ROM with therapy and while in bed--continue  -Will check right knee x-ray, left knee x-ray on 3/8 with advanced OA.  Will consult orthopedics for cortisone injections  -3-28: Orthopedics aspirated and injected left knee, with improvement.  Ongoing pain with right knee.  3/31 Knee fluid calcium pyrophosphate cyrystals, pseudogout?, cultures with no growth  -4/2 hydrocodone PRN started yesterday for flank pain    4. Mood/Behavior/Sleep: Trazodone 50 mg nightly as needed.  Provide emotional support             -antipsychotic agents: N/A   5. Neuropsych/cognition: This patient is capable of making decisions on his own behalf.   6. Skin/Wound Care: Routine skin checks   7. Fluids/Electrolytes/Nutrition:    -3/14 po intake remains poor, albumin low, weights falling  -will ask RD for assistance. Have spoken to patient about intake  -?IV albumin (see below)  3/18 discussed with dietitian, patient recorded is eating 0% of his meals.  Appears that wife bringing in 3 meals a day and he is eating about 75%, appears to have fairly adequate intake.  3/27 eating frequent outside foods-doesn't like hospital food as much  8.  Oliguric AKI superimposed on CKD stage IV.  Baseline creatinine 4.9.  CRRT discontinued 2/25.  No current plan for  long-term dialysis.  Follow-up renal services              - Per discussion with nephrology Dr. Verna Czech, keep Foley catheter in to allow accurate I's and O's through Monday; then, can remove and do DC Foley trial while continuing to document strict outputs             - Continue daily assessments of renal function per nephrology   3/8-  per renal, Cr leveling out- and con't Foley- will remove Monday  -nephrology following, Cr remains in 4's.   3/14 renal function with some improvement today     -suspect lower extremity edema is related to renal function/low albumin   Appreciate nephro input   -3/17 BUN/creatinine slightly improved 34/3.31, appreciate nephrology assistance  3/19 BUN/CR stable at 34/3.24, nephrology signing off, ok to check BMET twice a week  3/23 encouraging po this weekend. happy that he's eating food from home  3/24 BUN/CR overall stable at 40/3.26  -3/31 BUN/Cr a little higher today, IVF 71ml/hr  -4/1 Cr a little higher again, will contact nephrology  -4/2 reviewed nephrology note, OK to continue gentle IVF  9.  C. difficile colitis.  Completed course of oral vancomycin 3/7.  DC'd IV Flagyl 3/3. Enteric precautions   3/14 stools formed this morning   3/17 discussed with ID pharmacy, patient having more frequent bowel movements.  Monitor today and if continues consider Dificid treatment  3/18 diarrhea appears to be improving, continue to monitor for now  3/19 Frequent Bms improved, continue to follow  3/21 having more frequent bowel movements again today, continue to monitor and if frequent diarrhea persists this weekend consider treatment with Dificid  3/23- mushy/lqiuid stool again overnight    -no dificid at this point. Discuss with GI first on Monday    -added fiber to regimen yesterday, will increase to bid today  3/24 mushy/ liquid BMs, about 2 a day yesterday, more other days- will discuss GI  3/25 GI recommended repeat C. difficile screening. Discussed with  Jerol[facility], hold off on repeat screening test, he only had about 2 bowel movements a day but if continues to have frequent BMs consider retreatment with oral vancomycin for 10 days  3/26 pt reports Bms more firm, continue to monitor   3/28 patient reports BMs have been more solid, continue to monitor.  If worsening diarrhea occurs consider retreatment with vancomycin oral for 10 days  3-29: Diarrhea this a.m., no bowel movements this afternoon recorded  3-30: No BM since yesterday.  3-31 diarrhea continues to be improved overall  4-2 LBM yesterday, continue to monitor   10.  Status post left nephrostomy tube d/t staghorn calculi. Has Foley Nephrostomy tube placed 1/29 per IR with plan for ureteric stent placement 3/21              - Careful monitoring of nephrostomy output  -3/14 flomax has been added to improve emptying  -3/21 patient scheduled today for cystoscopy with left retrograde pyelogram, left ureteroscopy laser lithotripsy and left ureteral stent placement.     3/22- procedure appears successful thus far.    -urine clear, patient comfortable   -foley to come out on Monday per urology  3/25 Foley was removed patient continues to have urinary retention requiring IC.  Increase Flomax to 0.8 mg  3/26 intermittent blood noted with IC, if continues will restart foley  3/27 Urine no longer blood tinged, continue current regimen, consider restart foley if reoccurs  3/27 continues to require catheterization but no longer blood-tinged Addendum 3/28 foley started after traumatic cath, possible false passage, continue foley for now, f/u with urology outpatient 4/1 foley with hematuria, denies known trauma to this, will ask nursing to check bladder scan. U/A culture ordered.  Called his urologist to discuss 4/2 CT abdomen/pelvis-bilateral obstructive uropathy with collecting system hematuria, large calculus in right ureter and pelvic inlet, new left hydronephrosis, new anasarca, liver nodules  raising  possibility of cirrhosis.  Discussed patient with urology - surgical procedure today  Addendum discussed with urology, will hold eliquis for now due to bleeding, check EKG- A flutter -4/2 consult hospitalist tloday, continue to hold eliquis for now   11.  Chronic atrial fibrillation with episodes of bradycardia.  Followed by cardiology services.  Continue Eliquis.  Avoid AV nodal blocking agents   12.  Diabetes mellitus.  Hemoglobin A1c 6.2.    -tightly controlled. Dc SSI and change cbg checks to qam only   CBG (last 3)  Recent Labs    08/04/23 0613 08/05/23 0556 08/05/23 1120  GLUCAP 91 80 93       3-30: Recently increased.  Somewhat expected with intra-articular steroid injection.  Continue to trend.  13.  Hypotension.  ProAmatine 10 mg 3 times daily.  Monitor with increased mobility   - Normotensive on admission  3/9- BP running 100-s to 120s systolic- con't regimen  3/19 BP has been on high side, will change midodrine to 5mg  TID PRN orthostatic hypotension  3/28 BP stable, continue current regimen  -4/1 BP Stable, continue to monitor      08/05/2023   11:54 AM 08/05/2023    5:09 AM 08/05/2023    5:00 AM  Vitals with BMI  Height 5\' 9"     Weight 232 lbs 2 oz  232 lbs 2 oz  BMI 34.27  34.27  Systolic 120 106   Diastolic 60 60   Pulse  64     14.  Chronic anemia.  Niferex daily/Aranesp.  Monitor for any bleeding episodes   3/8- Hb 7.1 today- will order transfusion- transfusion of 1 unit pRBCs.   3/14 hgb up to 9.1  -3/17 hemoglobin down to 8.4, continue to monitor  -3/19 HGB 8.3 today, stable overall, continue to monitor    -3/27 HGB 7.7- a little lower likely due to GU procedure/hematuria, continue to monitor  3/31 HGB 7.6 today, transfuse 7 or less  Recheck CBC tomorrow, may need transfusion  16. Leukocytosis  3/9- Pt's WBC is 21.7- up from 16k- is afebrile- due to his recent C diff, that just finished correct dosing of ABX for, called ID for guidance on  treatment- don't want to cause more Cdiff- ID consulted ---no new orders  3/10 WBC's down to 17k  --have been hovering above and below 20 for awhile. No new clinical symptoms. Nephrostomy drainage clear, yellow  3/14 wbc's down to 13k.--recheck Monday   3/17 WBC is down to 12.4  3/19 WBC 13.4 today, continue to monitor  3/24 WBC higher today 18.7, Discuss C diff treatment with GI a above 3/31 WBC still elevated 19.0, monitor for signs of infection 4/1 WBC up to 24.4 will consult hospitalist  17. Decreased mood reported by therapy  -Consider SSRI  -3/20 patient reports his mood is okay, denies depression this morning.  Continue to monitor  18. Insomnia   -3/20 schedule trazodone and increase to 75mg  at bedtime  -3/21 insomnia improved but bowel movements at night did wake him up a few times  3/23 sleeping better when stooling isn't an issue  19. LE edema  -Check albumin/cmp with next labs. Leg elevation and compression   -Significantly improved on the left after steroid injection and effusion tap.  -Worsening edema, continue IVF for now, discussed with nephrology. Will consult hospitalist as above  LOS: 26 days A FACE TO FACE EVALUATION WAS PERFORMED  Fanny Dance 08/05/2023, 12:04 PM

## 2023-08-05 NOTE — Consult Note (Signed)
 Urology Consult   Physician requesting consult: Cruzita Lederer, PA-C  Reason for consult: Right ureteral calculus  History of Present Illness: Calvin Schultz is a 73 y.o. male with history of nephrolithiasis who is seen today for surgical management of a right ureteral calculus.  He has been followed by Dr. Mena Goes with [facility].  He underwent an urgent left nephrostomy tube placement for an obstructing stone with acute on chronic renal failure and UTI in 1/25.  He subsequently underwent cystoscopy with left retrograde pyelogram, left ureteroscopy with laser lithotripsy and insertion of a left ureteral stent and removal of the nephrostomy tube on 07/24/2023.  The left renal calculus was incompletely treated and he was scheduled for a staged procedure in several weeks.  He was preparing for discharge today but developed right-sided flank pain. CT imaging performed earlier this morning showed a 7-8 mm obstructing calculus in the right ureter with associated nephrosis and some left hydronephrosis despite a normal position left ureteral stent.  He had some slight worsening of his renal function with a creatinine of 4.69 this morning.  White blood cell count elevated at 24.4. Urologic consultation was requested for management of the new right ureteral calculus with obstruction.  Past Medical History:  Diagnosis Date   Arthritis    Atrial fibrillation (HCC)    CKD (chronic kidney disease) stage 3, GFR 30-59 ml/min (HCC)    Diabetes mellitus without complication (HCC)    Dysrhythmia    Hyperlipidemia    Hypertension     Past Surgical History:  Procedure Laterality Date   CYSTOSCOPY/URETEROSCOPY/HOLMIUM LASER/STENT PLACEMENT Left 07/24/2023   Procedure: CYSTOSCOPY/URETEROSCOPY/HOLMIUM LASER/ STENT PLACEMENT/REMOVAL OF NEPHROSTOMY TUBE;  Surgeon: Jerilee Field, MD;  Location: [facility];  Service: Urology;  Laterality: Left;  90 MINUTE CASE   IR NEPHROSTOMY PLACEMENT LEFT  06/03/2023   KIDNEY  SURGERY      Medications:  Home meds:  Allergies as of 08/05/2023   No Known Allergies      Medication List     STOP taking these medications    Eliquis 5 MG Tabs tablet Generic drug: apixaban   insulin aspart 100 UNIT/ML injection Commonly known as: novoLOG       TAKE these medications    acetaminophen 325 MG tablet Commonly known as: TYLENOL Take 2 tablets (650 mg total) by mouth every 4 (four) hours as needed for mild pain (pain score 1-3) or fever.   cephALEXin 250 MG capsule Commonly known as: KEFLEX Take 1 capsule (250 mg total) by mouth daily.   Darbepoetin Alfa 40 MCG/0.4ML Sosy injection Commonly known as: ARANESP Inject 0.4 mLs (40 mcg total) into the skin every Friday at 6 PM.   diclofenac Sodium 1 % Gel Commonly known as: VOLTAREN Apply 2 g topically 4 (four) times daily.   HYDROcodone-acetaminophen 5-325 MG tablet Commonly known as: NORCO/VICODIN Take 1 tablet by mouth every 6 (six) hours as needed for severe pain (pain score 7-10).   iron polysaccharides 150 MG capsule Commonly known as: NIFEREX Take 1 capsule (150 mg total) by mouth daily.   midodrine 5 MG tablet Commonly known as: PROAMATINE Take 1 tablet (5 mg total) by mouth 3 (three) times daily as needed (Orthosatic hypotension). What changed:  medication strength how much to take when to take this reasons to take this   multivitamin Tabs tablet Take 1 tablet by mouth at bedtime.   psyllium 95 % Pack Commonly known as: HYDROCIL/METAMUCIL Take 1 packet by mouth 2 (two) times daily.  sodium chloride 0.9 % infusion Inject 50 mLs into the vein continuous.   tamsulosin 0.4 MG Caps capsule Commonly known as: FLOMAX Take 2 capsules (0.8 mg total) by mouth daily after supper.   tiZANidine 4 MG tablet Commonly known as: ZANAFLEX Take 1 tablet (4 mg total) by mouth 2 (two) times daily as needed for muscle spasms.   traMADol 50 MG tablet Commonly known as: ULTRAM Take 1 tablet  (50 mg total) by mouth 2 (two) times daily.   traZODone 150 MG tablet Commonly known as: DESYREL Take 0.5 tablets (75 mg total) by mouth at bedtime. What changed:  medication strength how much to take when to take this reasons to take this        Scheduled Meds:  [MAR Hold] cephALEXin  250 mg Oral Daily   chlorhexidine       [MAR Hold] Chlorhexidine Gluconate Cloth  6 each Topical BID   [MAR Hold] darbepoetin (ARANESP) injection - NON-DIALYSIS  40 mcg Subcutaneous Q Fri-1800   [MAR Hold] diclofenac Sodium  2 g Topical QID   [MAR Hold] iron polysaccharides  150 mg Oral Daily   [MAR Hold] multivitamin  1 tablet Oral QHS   [MAR Hold] Muscle Rub   Topical TID   [MAR Hold] psyllium  1 packet Oral BID   [MAR Hold] tamsulosin  0.8 mg Oral QPC supper   [MAR Hold] traMADol  50 mg Oral BID   [MAR Hold] traZODone  75 mg Oral QHS   Continuous Infusions: PRN Meds:.[MAR Hold] acetaminophen, chlorhexidine, [MAR Hold] HYDROcodone-acetaminophen, [MAR Hold] lidocaine, [MAR Hold] midodrine, [MAR Hold] ondansetron (ZOFRAN) IV, [MAR Hold] simethicone, [MAR Hold] tiZANidine, [MAR Hold] traMADol  Allergies: No Known Allergies  Family History  Problem Relation Age of Onset   Dementia Mother     Social History:  reports that he has never smoked. He has never used smokeless tobacco. He reports that he does not drink alcohol and does not use drugs.  ROS: A complete review of systems was performed.  All systems are negative except for pertinent findings as noted.  Physical Exam:  Vital signs in last 24 hours: Temp:  [97.4 F (36.3 C)-97.9 F (36.6 C)] 97.9 F (36.6 C) (04/02 1154) Pulse Rate:  [64] 64 (04/02 0509) Resp:  [16-18] 18 (04/02 1154) BP: (106-139)/(60-76) 120/60 (04/02 1154) SpO2:  [98 %-100 %] 100 % (04/02 1154) Weight:  [105.3 kg] 105.3 kg (04/02 1154) GENERAL APPEARANCE:  Well appearing, well developed, well nourished, NAD HEENT:  Atraumatic, normocephalic, oropharynx  clear NECK:  Supple without lymphadenopathy or thyromegaly ABDOMEN:  Soft, non-tender, no masses EXTREMITIES:  Moves all extremities well, without clubbing, cyanosis, or edema NEUROLOGIC:  Alert and oriented x 3, normal gait, CN II-XII grossly intact MENTAL STATUS:  appropriate BACK:  Non-tender to palpation, No CVAT SKIN:  Warm, dry, and intact  Laboratory Data:  Recent Labs    08/03/23 0521 08/04/23 1939 08/05/23 0527  WBC 19.0* 19.1* 24.4*  HGB 7.6* 7.7* 7.3*  HCT 25.1* 25.2* 23.8*  PLT 368 350 372    Recent Labs    08/03/23 0521 08/04/23 0531 08/05/23 0916  NA 132* 133* 132*  K 4.0 4.2 4.3  CL 109 111 110  GLUCOSE 127* 98 87  BUN 74* 76* 81*  CALCIUM 8.1* 8.2* 8.1*  CREATININE 4.22* 4.45* 4.69*     Results for orders placed or performed during the hospital encounter of 07/10/23 (from the past 24 hours)  Urinalysis, Routine w reflex microscopic -  Urine, Unspecified Source     Status: Abnormal   Collection Time: 08/04/23  2:34 PM  Result Value Ref Range   Color, Urine RED (A) YELLOW   APPearance TURBID (A) CLEAR   Specific Gravity, Urine  1.005 - 1.030    TEST NOT REPORTED DUE TO COLOR INTERFERENCE OF URINE PIGMENT   pH  5.0 - 8.0    TEST NOT REPORTED DUE TO COLOR INTERFERENCE OF URINE PIGMENT   Glucose, UA (A) NEGATIVE mg/dL    TEST NOT REPORTED DUE TO COLOR INTERFERENCE OF URINE PIGMENT   Hgb urine dipstick (A) NEGATIVE    TEST NOT REPORTED DUE TO COLOR INTERFERENCE OF URINE PIGMENT   Bilirubin Urine (A) NEGATIVE    TEST NOT REPORTED DUE TO COLOR INTERFERENCE OF URINE PIGMENT   Ketones, ur (A) NEGATIVE mg/dL    TEST NOT REPORTED DUE TO COLOR INTERFERENCE OF URINE PIGMENT   Protein, ur (A) NEGATIVE mg/dL    TEST NOT REPORTED DUE TO COLOR INTERFERENCE OF URINE PIGMENT   Nitrite (A) NEGATIVE    TEST NOT REPORTED DUE TO COLOR INTERFERENCE OF URINE PIGMENT   Leukocytes,Ua (A) NEGATIVE    TEST NOT REPORTED DUE TO COLOR INTERFERENCE OF URINE PIGMENT   Urinalysis, Microscopic (reflex)     Status: Abnormal   Collection Time: 08/04/23  2:34 PM  Result Value Ref Range   RBC / HPF >50 0 - 5 RBC/hpf   WBC, UA >50 0 - 5 WBC/hpf   Bacteria, UA RARE (A) NONE SEEN   Squamous Epithelial / HPF NONE SEEN 0 - 5 /HPF   Budding Yeast PRESENT   CBC     Status: Abnormal   Collection Time: 08/04/23  7:39 PM  Result Value Ref Range   WBC 19.1 (H) 4.0 - 10.5 K/uL   RBC 2.91 (L) 4.22 - 5.81 MIL/uL   Hemoglobin 7.7 (L) 13.0 - 17.0 g/dL   HCT 16.1 (L) 09.6 - 04.5 %   MCV 86.6 80.0 - 100.0 fL   MCH 26.5 26.0 - 34.0 pg   MCHC 30.6 30.0 - 36.0 g/dL   RDW 40.9 (H) 81.1 - 91.4 %   Platelets 350 150 - 400 K/uL   nRBC 0.1 0.0 - 0.2 %  CBC     Status: Abnormal   Collection Time: 08/05/23  5:27 AM  Result Value Ref Range   WBC 24.4 (H) 4.0 - 10.5 K/uL   RBC 2.72 (L) 4.22 - 5.81 MIL/uL   Hemoglobin 7.3 (L) 13.0 - 17.0 g/dL   HCT 78.2 (L) 95.6 - 21.3 %   MCV 87.5 80.0 - 100.0 fL   MCH 26.8 26.0 - 34.0 pg   MCHC 30.7 30.0 - 36.0 g/dL   RDW 08.6 (H) 57.8 - 46.9 %   Platelets 372 150 - 400 K/uL   nRBC 0.1 0.0 - 0.2 %  Glucose, capillary     Status: None   Collection Time: 08/05/23  5:56 AM  Result Value Ref Range   Glucose-Capillary 80 70 - 99 mg/dL  Renal function panel     Status: Abnormal   Collection Time: 08/05/23  9:16 AM  Result Value Ref Range   Sodium 132 (L) 135 - 145 mmol/L   Potassium 4.3 3.5 - 5.1 mmol/L   Chloride 110 98 - 111 mmol/L   CO2 11 (L) 22 - 32 mmol/L   Glucose, Bld 87 70 - 99 mg/dL   BUN 81 (H) 8 - 23 mg/dL  Creatinine, Ser 4.69 (H) 0.61 - 1.24 mg/dL   Calcium 8.1 (L) 8.9 - 10.3 mg/dL   Phosphorus 7.4 (H) 2.5 - 4.6 mg/dL   Albumin 2.0 (L) 3.5 - 5.0 g/dL   GFR, Estimated 13 (L) >60 mL/min   Anion gap 11 5 - 15  Glucose, capillary     Status: None   Collection Time: 08/05/23 11:20 AM  Result Value Ref Range   Glucose-Capillary 93 70 - 99 mg/dL   Recent Results (from the past 240 hours)  Body fluid culture w Gram  Stain     Status: None   Collection Time: 07/31/23  2:20 PM   Specimen: Body Fluid  Result Value Ref Range Status   Specimen Description FLUID  Final   Special Requests LEFT KNEE  Final   Gram Stain   Final    ABUNDANT WBC PRESENT, PREDOMINANTLY PMN NO ORGANISMS SEEN    Culture   Final    NO GROWTH 3 DAYS Performed at [facility], [address], [place], [place] [address]    Report Status 08/03/2023 FINAL  Final    Renal Function: Recent Labs    07/30/23 0533 08/03/23 0521 08/04/23 0531 08/05/23 0916  CREATININE 3.65* 4.22* 4.45* 4.69*   Estimated Creatinine Clearance: 17.5 mL/min (A) (by C-G formula based on SCr of 4.69 mg/dL (H)).  Radiologic Imaging: CT ABDOMEN PELVIS WO CONTRAST Result Date: 08/05/2023 CLINICAL DATA:  73 year old male with gross hematuria. No nephrolithiasis. Left nephrostomy in February. EXAM: CT ABDOMEN AND PELVIS WITHOUT CONTRAST TECHNIQUE: Multidetector CT imaging of the abdomen and pelvis was performed following the standard protocol without IV contrast. RADIATION DOSE REDUCTION: This exam was performed according to the departmental dose-optimization program which includes automated exposure control, adjustment of the mA and/or kV according to patient size and/or use of iterative reconstruction technique. COMPARISON:  Noncontrast CT Abdomen and Pelvis 06/26/2023. FINDINGS: Lower chest: Stable cardiomegaly. No pericardial effusion. But small volume simple fluid density right pleural effusion is new since February. Contralateral left lung base fibrothorax and pleural thickening appears unchanged. Hepatobiliary: Cholelithiasis. Trace new perihepatic free fluid with simple fluid density. Mildly nodular liver contour again noted. No discrete liver lesion in the absence of contrast. Pancreas: Atrophied. Spleen: Stable and within normal limits. Adrenals/Urinary Tract: Adrenal glands remain within normal limits. Left side percutaneous nephrostomy seen in  February has been removed but a left double-J ureteral stent is in place with appropriate positioning. Superimposed developing staghorn calculus in the left midpole renal calices is noted and there is moderate increased left hydronephrosis with hyperdense fluid within the dilated collecting system (series 6, image 41). Chronic but increased perinephric soft tissue stranding. No calculus identified along the course of the double-J stent. Contralateral new moderate to severe right hydronephrosis and severe right hydroureter (up to 2 cm diameter). And there is hyperdense fluid, layering hematocrit also within the dilated right renal collecting system and pelvis (series 3, image 39). Increased right pararenal space inflammation and stranding. The right ureter remains dilated to the level of the pelvic inlet where a large obstructing 7-8 mm diameter calculus is noted on series 3, image 70 and coronal image 53. Distal to that the right ureter is decompressed to the bladder. Foley catheter and distal double-J stent are present within the bladder. Small volume of gas within the bladder. Bladder wall is mildly thickened and indistinct. Stomach/Bowel: Increased large bowel retained stool since February, mild-to-moderate. No dilated large or small bowel loops. Evidence that the appendix  remains diminutive and normal on series 3, image 59. Small volume retained fluid in the stomach. No pneumoperitoneum. Bilateral retroperitoneal, pericolic gutter inflammatory stranding which probably is emanating from the renal spaces. This is confluent in the left lower quadrant series 3, image 72, but adjacent bowel seems unaffected. Vascular/Lymphatic: Aortoiliac calcified atherosclerosis. Normal caliber abdominal aorta. Vascular patency is not evaluated in the absence of IV contrast. No lymphadenopathy identified. Reproductive: Urethral catheter in place. Other: Mild to moderate presacral edema. Musculoskeletal: Moderate to severe new  bilateral body wall, flank subcutaneous edema. Widespread lower thoracic through upper lumbar spinal ankylosis from bridging endplate osteophytes. No acute osseous abnormality identified. IMPRESSION: 1. Bilateral Acute Obstructive Uropathy with evidence of bilateral collecting system Hematuria. Large 7-8 mm diameter obstructing calculus of the right ureter at the pelvic inlet. New left hydronephrosis despite normally positioned left double-J ureteral stent. Left nephrostomy tube has been removed since February. Underlying left renal staghorn calculus. Associated bilateral pararenal space inflammation tracking in the retroperitoneum to the pelvis. 2. Foley catheter and distal double-J stent within the Urinary Bladder which appears mildly inflamed. 3. Superimposed new Anasarca: Body wall edema, trace perihepatic ascites (see #4), trace layering right pleural effusion. 4. Nodular liver contour raising the possibility of Cirrhosis. Chronic cholelithiasis. 5. Cardiomegaly. Left lung base fibrothorax. Aortic Atherosclerosis (ICD10-I70.0). Electronically Signed   By: Odessa Fleming M.D.   On: 08/05/2023 06:36    I independently reviewed the above imaging studies.  Impression/Recommendation Right renal calculus with obstruction Left nephrolithiasis  Given the evidence of obstruction secondary to a large right ureteral calculus, recommend surgical management with cystoscopy, retrograde pyelogram, and insertion of right ureteral stent to allow decompression of the right collecting system.  He understands that this procedure will not remove the stone and he will need a definitive procedure in the near future.  This will likely be done at the same time as his second stage left ureteroscopy by Dr. Mena Goes.  Risks, benefits, alternatives were discussed with the patient in detail.  Potential risks including, but not limited to, infection; bleeding;  injury to urethra, bladder, or ureter; possible need of other treatments;  ureteral  stricture formation; cardiac, pulmonary, cerebrovascular events; and anesthetic complications were discussed.  The patient understands and wishes to proceed.  Will proceed with cystoscopy, right retrograde pyelogram, and insertion of right ureteral stent.   Di Kindle 08/05/2023, 12:33 PM

## 2023-08-05 NOTE — Interval H&P Note (Signed)
 History and Physical Interval Note:  08/05/2023 12:42 PM  Calvin Schultz  has presented today for surgery, with the diagnosis of obstructed  ureteral stent.  The various methods of treatment have been discussed with the patient and family. After consideration of risks, benefits and other options for treatment, the patient has consented to  Procedure(s): CYSTOSCOPY, WITH RETROGRADE PYELOGRAM AND URETERAL STENT INSERTION (Right) as a surgical intervention.  The patient's history has been reviewed, patient examined, no change in status, stable for surgery.  I have reviewed the patient's chart and labs.  Questions were answered to the patient's satisfaction.     Di Kindle

## 2023-08-05 NOTE — Progress Notes (Signed)
 Physical Therapy Session Note  Patient Details  Name: Calvin Schultz MRN: [id number] Date of Birth: 1950-11-11  Today's Date: 08/05/2023 PT Missed Time: 135 Minutes Missed Time Reason: MD hold (Comment)  Short Term Goals: Week 4:  PT Short Term Goal 1 (Week 4): STG=LTG 2/2 ELOS  Skilled Therapeutic Interventions/Progress Updates:      Pt missed 135 min today 2/2 MD hold.   Therapy Documentation Precautions:  Precautions Precautions: Fall Precaution/Restrictions Comments: Foley, bilateral knee pain and decreased ROM Restrictions Weight Bearing Restrictions Per Provider Order: No   Therapy/Group: Individual Therapy  [facility] Ambrose [place], [place], DPT  08/05/2023, 3:37 PM

## 2023-08-05 NOTE — Anesthesia Procedure Notes (Signed)
 Procedure Name: LMA Insertion Date/Time: 08/05/2023 4:03 PM  Performed by: Venia Carbon, CRNAPre-anesthesia Checklist: Patient identified, Emergency Drugs available, Suction available, Patient being monitored and Timeout performed Patient Re-evaluated:Patient Re-evaluated prior to induction Oxygen Delivery Method: Circle system utilized Preoxygenation: Pre-oxygenation with 100% oxygen Induction Type: IV induction LMA: LMA inserted LMA Size: 4.0 Number of attempts: 1 Airway Equipment and Method: Patient positioned with wedge pillow Placement Confirmation: ETT inserted through vocal cords under direct vision, positive ETCO2, CO2 detector and breath sounds checked- equal and bilateral Tube secured with: Tape

## 2023-08-05 NOTE — Transfer of Care (Signed)
 Immediate Anesthesia Transfer of Care Note  Patient: Calvin Schultz  Procedure(s) Performed: CYSTOSCOPY, WITH RETROGRADE PYELOGRAM AND URETERAL STENT INSERTION (Right)  Patient Location: PACU  Anesthesia Type:General  Level of Consciousness: awake, alert , oriented, and patient cooperative  Airway & Oxygen Therapy: Patient Spontanous Breathing and Patient connected to face mask oxygen  Post-op Assessment: Report given to RN, Post -op Vital signs reviewed and stable, Patient moving all extremities, Patient moving all extremities X 4, and Patient able to stick tongue midline  Post vital signs: Reviewed and stable  Last Vitals:  Vitals Value Taken Time  BP 114/67 08/05/23 1645  Temp    Pulse 71 08/05/23 1646  Resp 11 08/05/23 1646  SpO2 100 % 08/05/23 1646  Vitals shown include unfiled device data.  Last Pain:  Vitals:   08/05/23 1216  TempSrc:   PainSc: 3       Patients Stated Pain Goal: 2 (08/02/23 2025)  Complications: No notable events documented.

## 2023-08-05 NOTE — Anesthesia Preprocedure Evaluation (Addendum)
 Anesthesia Evaluation  Patient identified by MRN, date of birth, ID band Patient awake    Reviewed: Allergy & Precautions, H&P , NPO status , Patient's Chart, lab work & pertinent test results  Airway Mallampati: II  TM Distance: >3 FB Neck ROM: full    Dental  (+) Teeth Intact, Dental Advisory Given   Pulmonary neg pulmonary ROS   breath sounds clear to auscultation       Cardiovascular hypertension, (-) angina (-) Past MI + dysrhythmias Atrial Fibrillation  Rhythm:regular Rate:Normal     Neuro/Psych negative neurological ROS     GI/Hepatic negative GI ROS, Neg liver ROS,,,  Endo/Other  diabetes, Type 2    Renal/GU Renal Insufficiency and CRFRenal diseasestones     Musculoskeletal  (+) Arthritis ,    Abdominal   Peds  Hematology  (+) Blood dyscrasia, anemia Hemoglobin 8.3   Anesthesia Other Findings   Reproductive/Obstetrics                             Anesthesia Physical Anesthesia Plan  ASA: 3  Anesthesia Plan: General   Post-op Pain Management: Tylenol PO (pre-op)*   Induction: Intravenous  PONV Risk Score and Plan: 2 and Ondansetron, Dexamethasone and Treatment may vary due to age or medical condition  Airway Management Planned: LMA  Additional Equipment:   Intra-op Plan:   Post-operative Plan: Extubation in OR  Informed Consent: I have reviewed the patients History and Physical, chart, labs and discussed the procedure including the risks, benefits and alternatives for the proposed anesthesia with the patient or authorized representative who has indicated his/her understanding and acceptance.     Dental advisory given  Plan Discussed with: CRNA and Surgeon  Anesthesia Plan Comments:        Anesthesia Quick Evaluation

## 2023-08-05 NOTE — Progress Notes (Signed)
 Patient was taken to CT and is now back in his room. He is denying any pain at this time. He is drowsy and fatigued. No needs at times time.

## 2023-08-05 NOTE — Anesthesia Postprocedure Evaluation (Signed)
 Anesthesia Post Note  Patient: Calvin Schultz  Procedure(s) Performed: CYSTOSCOPY, WITH RETROGRADE PYELOGRAM AND URETERAL STENT INSERTION (Right)     Patient location during evaluation: PACU Anesthesia Type: General Level of consciousness: awake Pain management: pain level controlled Vital Signs Assessment: post-procedure vital signs reviewed and stable Respiratory status: spontaneous breathing, nonlabored ventilation and respiratory function stable Cardiovascular status: blood pressure returned to baseline and stable Postop Assessment: no apparent nausea or vomiting Anesthetic complications: no   No notable events documented.  Last Vitals:  Vitals:   08/05/23 1728 08/05/23 1901  BP: 132/65 (!) 113/51  Pulse: 82 60  Resp: 16 16  Temp: (!) 36.3 C (!) 36.3 C  SpO2: 98% 99%    Last Pain:  Vitals:   08/05/23 1901  TempSrc: Oral  PainSc:                  [name]

## 2023-08-05 NOTE — Progress Notes (Signed)
 Occupational Therapy Session Note  Patient Details  Name: Calvin Schultz MRN: [id number] Date of Birth: [birth year]  {CHL IP REHAB OT TIME CALCULATIONS:[id number]}   Short Term Goals: Week 4:  OT Short Term Goal 1 (Week 4): STG = LTG (d/t ELOS)  Skilled Therapeutic Interventions/Progress Updates:    Patient agreeable to participate in OT session. Reports *** pain level.   Patient participated in skilled OT session focusing on ***. Therapist facilitated/assessed/developed/educated/integrated/elicited *** in order to improve/facilitate/promote    Therapy Documentation Precautions:  Precautions Precautions: Fall Precaution/Restrictions Comments: Foley, bilateral knee pain and decreased ROM Restrictions Weight Bearing Restrictions Per Provider Order: No  Therapy/Group: Individual Therapy  Limmie Patricia, OTR/L,CBIS  Supplemental OT - [facility] and [facility] Secure Chat Preferred   08/05/2023, 7:42 PM

## 2023-08-05 NOTE — H&P (View-Only) (Signed)
 Urology Consult   Physician requesting consult: Cruzita Lederer, PA-C  Reason for consult: Right ureteral calculus  History of Present Illness: Calvin Schultz is a 73 y.o. male with history of nephrolithiasis who is seen today for surgical management of a right ureteral calculus.  He has been followed by Dr. Mena Goes with [facility].  He underwent an urgent left nephrostomy tube placement for an obstructing stone with acute on chronic renal failure and UTI in 1/25.  He subsequently underwent cystoscopy with left retrograde pyelogram, left ureteroscopy with laser lithotripsy and insertion of a left ureteral stent and removal of the nephrostomy tube on 07/24/2023.  The left renal calculus was incompletely treated and he was scheduled for a staged procedure in several weeks.  He was preparing for discharge today but developed right-sided flank pain. CT imaging performed earlier this morning showed a 7-8 mm obstructing calculus in the right ureter with associated nephrosis and some left hydronephrosis despite a normal position left ureteral stent.  He had some slight worsening of his renal function with a creatinine of 4.69 this morning.  White blood cell count elevated at 24.4. Urologic consultation was requested for management of the new right ureteral calculus with obstruction.  Past Medical History:  Diagnosis Date   Arthritis    Atrial fibrillation (HCC)    CKD (chronic kidney disease) stage 3, GFR 30-59 ml/min (HCC)    Diabetes mellitus without complication (HCC)    Dysrhythmia    Hyperlipidemia    Hypertension     Past Surgical History:  Procedure Laterality Date   CYSTOSCOPY/URETEROSCOPY/HOLMIUM LASER/STENT PLACEMENT Left 07/24/2023   Procedure: CYSTOSCOPY/URETEROSCOPY/HOLMIUM LASER/ STENT PLACEMENT/REMOVAL OF NEPHROSTOMY TUBE;  Surgeon: Jerilee Field, MD;  Location: [facility];  Service: Urology;  Laterality: Left;  90 MINUTE CASE   IR NEPHROSTOMY PLACEMENT LEFT  06/03/2023   KIDNEY  SURGERY      Medications:  Home meds:  Allergies as of 08/05/2023   No Known Allergies      Medication List     STOP taking these medications    Eliquis 5 MG Tabs tablet Generic drug: apixaban   insulin aspart 100 UNIT/ML injection Commonly known as: novoLOG       TAKE these medications    acetaminophen 325 MG tablet Commonly known as: TYLENOL Take 2 tablets (650 mg total) by mouth every 4 (four) hours as needed for mild pain (pain score 1-3) or fever.   cephALEXin 250 MG capsule Commonly known as: KEFLEX Take 1 capsule (250 mg total) by mouth daily.   Darbepoetin Alfa 40 MCG/0.4ML Sosy injection Commonly known as: ARANESP Inject 0.4 mLs (40 mcg total) into the skin every Friday at 6 PM.   diclofenac Sodium 1 % Gel Commonly known as: VOLTAREN Apply 2 g topically 4 (four) times daily.   HYDROcodone-acetaminophen 5-325 MG tablet Commonly known as: NORCO/VICODIN Take 1 tablet by mouth every 6 (six) hours as needed for severe pain (pain score 7-10).   iron polysaccharides 150 MG capsule Commonly known as: NIFEREX Take 1 capsule (150 mg total) by mouth daily.   midodrine 5 MG tablet Commonly known as: PROAMATINE Take 1 tablet (5 mg total) by mouth 3 (three) times daily as needed (Orthosatic hypotension). What changed:  medication strength how much to take when to take this reasons to take this   multivitamin Tabs tablet Take 1 tablet by mouth at bedtime.   psyllium 95 % Pack Commonly known as: HYDROCIL/METAMUCIL Take 1 packet by mouth 2 (two) times daily.  sodium chloride 0.9 % infusion Inject 50 mLs into the vein continuous.   tamsulosin 0.4 MG Caps capsule Commonly known as: FLOMAX Take 2 capsules (0.8 mg total) by mouth daily after supper.   tiZANidine 4 MG tablet Commonly known as: ZANAFLEX Take 1 tablet (4 mg total) by mouth 2 (two) times daily as needed for muscle spasms.   traMADol 50 MG tablet Commonly known as: ULTRAM Take 1 tablet  (50 mg total) by mouth 2 (two) times daily.   traZODone 150 MG tablet Commonly known as: DESYREL Take 0.5 tablets (75 mg total) by mouth at bedtime. What changed:  medication strength how much to take when to take this reasons to take this        Scheduled Meds:  [MAR Hold] cephALEXin  250 mg Oral Daily   chlorhexidine       [MAR Hold] Chlorhexidine Gluconate Cloth  6 each Topical BID   [MAR Hold] darbepoetin (ARANESP) injection - NON-DIALYSIS  40 mcg Subcutaneous Q Fri-1800   [MAR Hold] diclofenac Sodium  2 g Topical QID   [MAR Hold] iron polysaccharides  150 mg Oral Daily   [MAR Hold] multivitamin  1 tablet Oral QHS   [MAR Hold] Muscle Rub   Topical TID   [MAR Hold] psyllium  1 packet Oral BID   [MAR Hold] tamsulosin  0.8 mg Oral QPC supper   [MAR Hold] traMADol  50 mg Oral BID   [MAR Hold] traZODone  75 mg Oral QHS   Continuous Infusions: PRN Meds:.[MAR Hold] acetaminophen, chlorhexidine, [MAR Hold] HYDROcodone-acetaminophen, [MAR Hold] lidocaine, [MAR Hold] midodrine, [MAR Hold] ondansetron (ZOFRAN) IV, [MAR Hold] simethicone, [MAR Hold] tiZANidine, [MAR Hold] traMADol  Allergies: No Known Allergies  Family History  Problem Relation Age of Onset   Dementia Mother     Social History:  reports that he has never smoked. He has never used smokeless tobacco. He reports that he does not drink alcohol and does not use drugs.  ROS: A complete review of systems was performed.  All systems are negative except for pertinent findings as noted.  Physical Exam:  Vital signs in last 24 hours: Temp:  [97.4 F (36.3 C)-97.9 F (36.6 C)] 97.9 F (36.6 C) (04/02 1154) Pulse Rate:  [64] 64 (04/02 0509) Resp:  [16-18] 18 (04/02 1154) BP: (106-139)/(60-76) 120/60 (04/02 1154) SpO2:  [98 %-100 %] 100 % (04/02 1154) Weight:  [105.3 kg] 105.3 kg (04/02 1154) GENERAL APPEARANCE:  Well appearing, well developed, well nourished, NAD HEENT:  Atraumatic, normocephalic, oropharynx  clear NECK:  Supple without lymphadenopathy or thyromegaly ABDOMEN:  Soft, non-tender, no masses EXTREMITIES:  Moves all extremities well, without clubbing, cyanosis, or edema NEUROLOGIC:  Alert and oriented x 3, normal gait, CN II-XII grossly intact MENTAL STATUS:  appropriate BACK:  Non-tender to palpation, No CVAT SKIN:  Warm, dry, and intact  Laboratory Data:  Recent Labs    08/03/23 0521 08/04/23 1939 08/05/23 0527  WBC 19.0* 19.1* 24.4*  HGB 7.6* 7.7* 7.3*  HCT 25.1* 25.2* 23.8*  PLT 368 350 372    Recent Labs    08/03/23 0521 08/04/23 0531 08/05/23 0916  NA 132* 133* 132*  K 4.0 4.2 4.3  CL 109 111 110  GLUCOSE 127* 98 87  BUN 74* 76* 81*  CALCIUM 8.1* 8.2* 8.1*  CREATININE 4.22* 4.45* 4.69*     Results for orders placed or performed during the hospital encounter of 07/10/23 (from the past 24 hours)  Urinalysis, Routine w reflex microscopic -  Urine, Unspecified Source     Status: Abnormal   Collection Time: 08/04/23  2:34 PM  Result Value Ref Range   Color, Urine RED (A) YELLOW   APPearance TURBID (A) CLEAR   Specific Gravity, Urine  1.005 - 1.030    TEST NOT REPORTED DUE TO COLOR INTERFERENCE OF URINE PIGMENT   pH  5.0 - 8.0    TEST NOT REPORTED DUE TO COLOR INTERFERENCE OF URINE PIGMENT   Glucose, UA (A) NEGATIVE mg/dL    TEST NOT REPORTED DUE TO COLOR INTERFERENCE OF URINE PIGMENT   Hgb urine dipstick (A) NEGATIVE    TEST NOT REPORTED DUE TO COLOR INTERFERENCE OF URINE PIGMENT   Bilirubin Urine (A) NEGATIVE    TEST NOT REPORTED DUE TO COLOR INTERFERENCE OF URINE PIGMENT   Ketones, ur (A) NEGATIVE mg/dL    TEST NOT REPORTED DUE TO COLOR INTERFERENCE OF URINE PIGMENT   Protein, ur (A) NEGATIVE mg/dL    TEST NOT REPORTED DUE TO COLOR INTERFERENCE OF URINE PIGMENT   Nitrite (A) NEGATIVE    TEST NOT REPORTED DUE TO COLOR INTERFERENCE OF URINE PIGMENT   Leukocytes,Ua (A) NEGATIVE    TEST NOT REPORTED DUE TO COLOR INTERFERENCE OF URINE PIGMENT   Urinalysis, Microscopic (reflex)     Status: Abnormal   Collection Time: 08/04/23  2:34 PM  Result Value Ref Range   RBC / HPF >50 0 - 5 RBC/hpf   WBC, UA >50 0 - 5 WBC/hpf   Bacteria, UA RARE (A) NONE SEEN   Squamous Epithelial / HPF NONE SEEN 0 - 5 /HPF   Budding Yeast PRESENT   CBC     Status: Abnormal   Collection Time: 08/04/23  7:39 PM  Result Value Ref Range   WBC 19.1 (H) 4.0 - 10.5 K/uL   RBC 2.91 (L) 4.22 - 5.81 MIL/uL   Hemoglobin 7.7 (L) 13.0 - 17.0 g/dL   HCT 16.1 (L) 09.6 - 04.5 %   MCV 86.6 80.0 - 100.0 fL   MCH 26.5 26.0 - 34.0 pg   MCHC 30.6 30.0 - 36.0 g/dL   RDW 40.9 (H) 81.1 - 91.4 %   Platelets 350 150 - 400 K/uL   nRBC 0.1 0.0 - 0.2 %  CBC     Status: Abnormal   Collection Time: 08/05/23  5:27 AM  Result Value Ref Range   WBC 24.4 (H) 4.0 - 10.5 K/uL   RBC 2.72 (L) 4.22 - 5.81 MIL/uL   Hemoglobin 7.3 (L) 13.0 - 17.0 g/dL   HCT 78.2 (L) 95.6 - 21.3 %   MCV 87.5 80.0 - 100.0 fL   MCH 26.8 26.0 - 34.0 pg   MCHC 30.7 30.0 - 36.0 g/dL   RDW 08.6 (H) 57.8 - 46.9 %   Platelets 372 150 - 400 K/uL   nRBC 0.1 0.0 - 0.2 %  Glucose, capillary     Status: None   Collection Time: 08/05/23  5:56 AM  Result Value Ref Range   Glucose-Capillary 80 70 - 99 mg/dL  Renal function panel     Status: Abnormal   Collection Time: 08/05/23  9:16 AM  Result Value Ref Range   Sodium 132 (L) 135 - 145 mmol/L   Potassium 4.3 3.5 - 5.1 mmol/L   Chloride 110 98 - 111 mmol/L   CO2 11 (L) 22 - 32 mmol/L   Glucose, Bld 87 70 - 99 mg/dL   BUN 81 (H) 8 - 23 mg/dL  Creatinine, Ser 4.69 (H) 0.61 - 1.24 mg/dL   Calcium 8.1 (L) 8.9 - 10.3 mg/dL   Phosphorus 7.4 (H) 2.5 - 4.6 mg/dL   Albumin 2.0 (L) 3.5 - 5.0 g/dL   GFR, Estimated 13 (L) >60 mL/min   Anion gap 11 5 - 15  Glucose, capillary     Status: None   Collection Time: 08/05/23 11:20 AM  Result Value Ref Range   Glucose-Capillary 93 70 - 99 mg/dL   Recent Results (from the past 240 hours)  Body fluid culture w Gram  Stain     Status: None   Collection Time: 07/31/23  2:20 PM   Specimen: Body Fluid  Result Value Ref Range Status   Specimen Description FLUID  Final   Special Requests LEFT KNEE  Final   Gram Stain   Final    ABUNDANT WBC PRESENT, PREDOMINANTLY PMN NO ORGANISMS SEEN    Culture   Final    NO GROWTH 3 DAYS Performed at [facility], [address], [place], [place] [address]    Report Status 08/03/2023 FINAL  Final    Renal Function: Recent Labs    07/30/23 0533 08/03/23 0521 08/04/23 0531 08/05/23 0916  CREATININE 3.65* 4.22* 4.45* 4.69*   Estimated Creatinine Clearance: 17.5 mL/min (A) (by C-G formula based on SCr of 4.69 mg/dL (H)).  Radiologic Imaging: CT ABDOMEN PELVIS WO CONTRAST Result Date: 08/05/2023 CLINICAL DATA:  73 year old male with gross hematuria. No nephrolithiasis. Left nephrostomy in February. EXAM: CT ABDOMEN AND PELVIS WITHOUT CONTRAST TECHNIQUE: Multidetector CT imaging of the abdomen and pelvis was performed following the standard protocol without IV contrast. RADIATION DOSE REDUCTION: This exam was performed according to the departmental dose-optimization program which includes automated exposure control, adjustment of the mA and/or kV according to patient size and/or use of iterative reconstruction technique. COMPARISON:  Noncontrast CT Abdomen and Pelvis 06/26/2023. FINDINGS: Lower chest: Stable cardiomegaly. No pericardial effusion. But small volume simple fluid density right pleural effusion is new since February. Contralateral left lung base fibrothorax and pleural thickening appears unchanged. Hepatobiliary: Cholelithiasis. Trace new perihepatic free fluid with simple fluid density. Mildly nodular liver contour again noted. No discrete liver lesion in the absence of contrast. Pancreas: Atrophied. Spleen: Stable and within normal limits. Adrenals/Urinary Tract: Adrenal glands remain within normal limits. Left side percutaneous nephrostomy seen in  February has been removed but a left double-J ureteral stent is in place with appropriate positioning. Superimposed developing staghorn calculus in the left midpole renal calices is noted and there is moderate increased left hydronephrosis with hyperdense fluid within the dilated collecting system (series 6, image 41). Chronic but increased perinephric soft tissue stranding. No calculus identified along the course of the double-J stent. Contralateral new moderate to severe right hydronephrosis and severe right hydroureter (up to 2 cm diameter). And there is hyperdense fluid, layering hematocrit also within the dilated right renal collecting system and pelvis (series 3, image 39). Increased right pararenal space inflammation and stranding. The right ureter remains dilated to the level of the pelvic inlet where a large obstructing 7-8 mm diameter calculus is noted on series 3, image 70 and coronal image 53. Distal to that the right ureter is decompressed to the bladder. Foley catheter and distal double-J stent are present within the bladder. Small volume of gas within the bladder. Bladder wall is mildly thickened and indistinct. Stomach/Bowel: Increased large bowel retained stool since February, mild-to-moderate. No dilated large or small bowel loops. Evidence that the appendix  remains diminutive and normal on series 3, image 59. Small volume retained fluid in the stomach. No pneumoperitoneum. Bilateral retroperitoneal, pericolic gutter inflammatory stranding which probably is emanating from the renal spaces. This is confluent in the left lower quadrant series 3, image 72, but adjacent bowel seems unaffected. Vascular/Lymphatic: Aortoiliac calcified atherosclerosis. Normal caliber abdominal aorta. Vascular patency is not evaluated in the absence of IV contrast. No lymphadenopathy identified. Reproductive: Urethral catheter in place. Other: Mild to moderate presacral edema. Musculoskeletal: Moderate to severe new  bilateral body wall, flank subcutaneous edema. Widespread lower thoracic through upper lumbar spinal ankylosis from bridging endplate osteophytes. No acute osseous abnormality identified. IMPRESSION: 1. Bilateral Acute Obstructive Uropathy with evidence of bilateral collecting system Hematuria. Large 7-8 mm diameter obstructing calculus of the right ureter at the pelvic inlet. New left hydronephrosis despite normally positioned left double-J ureteral stent. Left nephrostomy tube has been removed since February. Underlying left renal staghorn calculus. Associated bilateral pararenal space inflammation tracking in the retroperitoneum to the pelvis. 2. Foley catheter and distal double-J stent within the Urinary Bladder which appears mildly inflamed. 3. Superimposed new Anasarca: Body wall edema, trace perihepatic ascites (see #4), trace layering right pleural effusion. 4. Nodular liver contour raising the possibility of Cirrhosis. Chronic cholelithiasis. 5. Cardiomegaly. Left lung base fibrothorax. Aortic Atherosclerosis (ICD10-I70.0). Electronically Signed   By: Odessa Fleming M.D.   On: 08/05/2023 06:36    I independently reviewed the above imaging studies.  Impression/Recommendation Right renal calculus with obstruction Left nephrolithiasis  Given the evidence of obstruction secondary to a large right ureteral calculus, recommend surgical management with cystoscopy, retrograde pyelogram, and insertion of right ureteral stent to allow decompression of the right collecting system.  He understands that this procedure will not remove the stone and he will need a definitive procedure in the near future.  This will likely be done at the same time as his second stage left ureteroscopy by Dr. Mena Goes.  Risks, benefits, alternatives were discussed with the patient in detail.  Potential risks including, but not limited to, infection; bleeding;  injury to urethra, bladder, or ureter; possible need of other treatments;  ureteral  stricture formation; cardiac, pulmonary, cerebrovascular events; and anesthetic complications were discussed.  The patient understands and wishes to proceed.  Will proceed with cystoscopy, right retrograde pyelogram, and insertion of right ureteral stent.   Di Kindle 08/05/2023, 12:33 PM

## 2023-08-05 NOTE — Consult Note (Signed)
 Initial Consultation Note   Patient: Calvin Schultz WUJ:[id number] DOB: 1950-11-04 PCP: Gwenlyn Found, MD DOA: 07/10/2023 DOS: the patient was seen and examined on 08/05/2023 Primary service: Fanny Dance, MD  Referring physician: Fanny Dance  Reason for consult: medical management  Assessment/Plan: Assessment and Plan:     Obstructive uropathy AKI on CKD stage IV Metabolic acidosis Creatinine continuing to trend up to 4.69 with BUN 81.  CO2 noted to be 11 without elevated anion gap.  Patient creatinine previously had been improved down to after CRRT had been stopped on 3/19.  On patient reported complaints of left-sided flank pain.  CT revealed A CT scan of the abdomen pelvis was obtained which noted bilateral acute obstructive uropathy with evidence of bilateral collecting system hematuria with large 7- 8 mm diameter obstructing calculus of the right ureter at the pelvic inlet and new left hydronephrosis despite normally positioned double-J ureteral stent.  Urology had been consulted and plan on cystoscopy with possible stent placement.  Patient was given empiric antibiotics to Rocephin perioperatively. -Follow-up urine cultures -N.p.o. for procedure -Strict I&Os -Avoid possible nephrotoxic agents -Sodium bicarb drip overnight  -Recheck kidney function in a.m.  Leukocytosis Acute on chronic.  White blood cell count trending up to 24.4 today.  Patient otherwise appears to be afebrile.  Suspect possibly related to obstructive uropathy or the possibility of underlying infection.  No other SIRS criteria met at this time. -Recheck CBC tomorrow morning.  Normocytic anemia Acute on chronic noted to drop from 7.7 down to 7.3.  Patient with more than 300 mL of grossly frank blood present in Foley catheter at this time prior to his urologic procedure. -Type and screen and ordered 1 unit of packed red blood cells as patient was noted to have 12 baseline blood pressures.  Atrial  fibrillation/flutter Chronic. Patient appears to be relatively rate controlled.  Eliquis on hold since yesterday due to hematuria. -Continue to hold Eliquis.  History of C diff colitis Patient had been treated for C. difficile last month.  He reports improvement in diarrhea symptoms since that time.  Controlled diabetes mellitus type 2  Blood sugars have been in low normal.  Last available hemoglobin A1c was noted to be 6.2 when checked on 06/2023. -Hypoglycemic protocols -Consider need to place on very sensitive sliding scale insulin in a.m. once patient tolerating p.o. and blood sugars noted to trend greater than 180  Orthostatic hypotension -Continue midodrine prn  Debility -Per CIR    TR[facility] will continue to follow the patient.  HPI: Calvin Schultz is a 73 y.o. male with past medical history of atrial fibrillation on Eliquis, hypertension, diabetes mellitus type 2, CKD.  Records note patient had been hospitalized 1/25-1/31 acute on chronic renal failure with left-sided hydronephrosis secondary to several calculi in the left renal collecting system with concern for staghorn calculus concerning for left UPJ stricture.  Patient underwent nephrostomy tube placement and was placed on empiric antibiotics of Rocephin and Bactrim. Also treated for lower extremity edema during that admission with aggressive IV Lasix diuresis initial postop course had been uncomplicated, but patient developed worsening weakness with reports of nausea, vomiting, diarrhea, and left lower quadrant pain.  Rehospitalized from 2/21-3/7 at [facility] with concern for septic shock initially on norepinephrine drip related to colitis of the descending colon. C. difficile antigen and toxin positive.  Patient was noted to have AKI superimposed on CKD stage IV for which nephrology was consulted and patient was placed on CRRT prior to requiring  transfer to [facility].  Due to patient's weakness he was admitted to inpatient rehab on 3/7.   He subsequently underwent cystoscopy with left retrograde pyelogram, left ureteroscopy with laser lithotripsy and insertion of a left ureteral stent and removal of the nephrostomy tube on 07/24/2023 by Dr. Mena Goes.  The left renal calculus was incompletely treated and he was scheduled for a staged procedure in a couple weeks.    Additional history is obtained from patient's wife.  During his inpatient rehab stay there have been issues with catheterization, including bleeding after certain procedures, and blood has been noted in the catheter, possibly related to trauma during catheterization. He has been having pain mostly on the left flank. He has not had nausea or vomiting recently, although he started coughing up sputum two days ago with no food intake during that time.  Patient has been off of Eliquis since yesterday.  Hemoglobin has been relatively stable but trended from 7.7 down to 7.3 on check today.  The sputum is described as clear or sometimes milky. No shortness of breath. He has noticed leg swelling, which has not worsened in the last two days.  A CT scan of the abdomen pelvis was obtained which noted bilateral acute obstructive uropathy with evidence of bilateral collecting system hematuria with large 7- 8 mm diameter obstructing calculus of the right ureter at the pelvic inlet and new left hydronephrosis despite normally positioned double-J ureteral stent, bladder wall inflammation, anasarca, trace pelvic ascites, nodular liver contour giving concern for cirrhosis. Diarrhea was present but has improved with Metamucil, which he finds unpleasant, but effective.    Review of Systems: As mentioned in the history of present illness. All other systems reviewed and are negative. Past Medical History:  Diagnosis Date   Arthritis    Atrial fibrillation (HCC)    CKD (chronic kidney disease) stage 3, GFR 30-59 ml/min (HCC)    Diabetes mellitus without complication (HCC)    Dysrhythmia     Hyperlipidemia    Hypertension    Past Surgical History:  Procedure Laterality Date   CYSTOSCOPY/URETEROSCOPY/HOLMIUM LASER/STENT PLACEMENT Left 07/24/2023   Procedure: CYSTOSCOPY/URETEROSCOPY/HOLMIUM LASER/ STENT PLACEMENT/REMOVAL OF NEPHROSTOMY TUBE;  Surgeon: Jerilee Field, MD;  Location: [facility] ORS;  Service: Urology;  Laterality: Left;  90 MINUTE CASE   IR NEPHROSTOMY PLACEMENT LEFT  06/03/2023   KIDNEY SURGERY     Social History:  reports that he has never smoked. He has never used smokeless tobacco. He reports that he does not drink alcohol and does not use drugs.  No Known Allergies  Family History  Problem Relation Age of Onset   Dementia Mother     Prior to Admission medications   Medication Sig Start Date End Date Taking? Authorizing Provider  acetaminophen (TYLENOL) 325 MG tablet Take 2 tablets (650 mg total) by mouth every 4 (four) hours as needed for mild pain (pain score 1-3) or fever. 08/05/23   Angiulli, Mcarthur Rossetti, PA-C  cephALEXin (KEFLEX) 250 MG capsule Take 1 capsule (250 mg total) by mouth daily. 08/05/23   Angiulli, Mcarthur Rossetti, PA-C  Darbepoetin Alfa (ARANESP) 40 MCG/0.4ML SOSY injection Inject 0.4 mLs (40 mcg total) into the skin every Friday at 6 PM. 07/10/23   Joseph Art, DO  diclofenac Sodium (VOLTAREN) 1 % GEL Apply 2 g topically 4 (four) times daily. 08/05/23   Angiulli, Mcarthur Rossetti, PA-C  ELIQUIS 5 MG TABS tablet Take 5 mg by mouth 2 (two) times daily. 08/24/21   [provider]  HYDROcodone-acetaminophen (NORCO/VICODIN) 5-325 MG tablet Take 1 tablet by mouth every 6 (six) hours as needed for severe pain (pain score 7-10). 08/05/23   Angiulli, Mcarthur Rossetti, PA-C  insulin aspart (NOVOLOG) 100 UNIT/ML injection Inject 0-9 Units into the skin 3 (three) times daily with meals. 07/10/23   Joseph Art, DO  iron polysaccharides (NIFEREX) 150 MG capsule Take 1 capsule (150 mg total) by mouth daily. 07/11/23   Joseph Art, DO  midodrine (PROAMATINE) 10 MG tablet Take 1  tablet (10 mg total) by mouth 3 (three) times daily with meals. 07/10/23   Joseph Art, DO  midodrine (PROAMATINE) 5 MG tablet Take 1 tablet (5 mg total) by mouth 3 (three) times daily as needed (Orthosatic hypotension). 08/05/23   Angiulli, Mcarthur Rossetti, PA-C  multivitamin (RENA-VIT) TABS tablet Take 1 tablet by mouth at bedtime. 08/05/23   Angiulli, Mcarthur Rossetti, PA-C  psyllium (HYDROCIL/METAMUCIL) 95 % PACK Take 1 packet by mouth 2 (two) times daily. 08/05/23   Angiulli, Mcarthur Rossetti, PA-C  sodium chloride 0.9 % infusion Inject 50 mLs into the vein continuous. 08/05/23   Angiulli, Mcarthur Rossetti, PA-C  tamsulosin (FLOMAX) 0.4 MG CAPS capsule Take 2 capsules (0.8 mg total) by mouth daily after supper. 08/05/23   Angiulli, Mcarthur Rossetti, PA-C  tiZANidine (ZANAFLEX) 4 MG tablet Take 1 tablet (4 mg total) by mouth 2 (two) times daily as needed for muscle spasms. 07/10/23   Joseph Art, DO  tiZANidine (ZANAFLEX) 4 MG tablet Take 1 tablet (4 mg total) by mouth 2 (two) times daily as needed for muscle spasms. 08/05/23   Angiulli, Mcarthur Rossetti, PA-C  traMADol (ULTRAM) 50 MG tablet Take 1 tablet (50 mg total) by mouth 2 (two) times daily. 08/05/23   Angiulli, Mcarthur Rossetti, PA-C  traZODone (DESYREL) 150 MG tablet Take 0.5 tablets (75 mg total) by mouth at bedtime. 08/05/23   Angiulli, Mcarthur Rossetti, PA-C  traZODone (DESYREL) 50 MG tablet Take 1 tablet (50 mg total) by mouth at bedtime as needed for sleep. 07/10/23   Joseph Art, DO    Physical Exam: Vitals:   08/05/23 1645 08/05/23 1700 08/05/23 1712 08/05/23 1728  BP: 114/67 116/70 (!) 115/58 132/65  Pulse: 75 77 77 82  Resp: 15 16 19 16   Temp:   (!) 97.2 F (36.2 C) (!) 97.4 F (36.3 C)  TempSrc:    Oral  SpO2: 100% 97% 97% 98%  Weight:      Height:        Constitutional: Elderly male currently in NAD, calm, comfortable Eyes: PERRL, lids and conjunctivae normal ENMT: Mucous membranes are moist.  Normal dentition.  Neck: normal, supple  Respiratory: clear to auscultation bilaterally,  no wheezing, no crackles. Normal respiratory effort. No accessory muscle use.  Cardiovascular: irregular, no  rubs / gallops. 2+ lower extremity edema.  No carotid bruits.  Abdomen: no tenderness, no masses palpated.  Foley catheter in place draining frankly bloody urine.  Bowel sounds positive.  Musculoskeletal: no clubbing / cyanosis. No joint deformity upper and lower extremities. Good ROM, no contractures. Normal muscle tone.  Skin: no rashes, lesions, ulcers.   Neurologic: CN 2-12 grossly intact.   Psychiatric: Normal judgment and insight. Alert and oriented x 3. Normal mood.   Data Reviewed:   EKG revealed atrial flutter 64 bpm.   Family Communication: Wife updated at bedside Primary team communication:   Thank you very much for involving [place] in the care of your patient.  Author: Arn Medal  Burtis Junes, MD 08/05/2023 6:44 PM  For on call review [email or web address].

## 2023-08-05 NOTE — Op Note (Signed)
 OPERATIVE NOTE   Patient Name: Calvin Schultz  MRN: [id number]   Date of Procedure: 08/05/23    Preoperative diagnosis:  Right ureteral calculus Left nephrolithiasis Gross hematuria  Postoperative diagnosis:  Right ureteral calculus Left nephrolithiasis Gross hematuria  Procedure:  Cystoscopy Right retrograde pyelogram Insertion of right ureteral stent (72F x 28 cm, no tether)  Attending: Milderd Meager, MD  Anesthesia: General  Estimated blood loss:  None  Fluids: Per anesthesia record  Drains: 72F x 28 cm right ureteral stent, tether removed  Specimens: None  Antibiotics: Ceftriaxone 1 gm IV  Findings: No obvious urethral trauma; normal prostatic urethra; small blood clots in the bladder; left ureteral stent emanating from left UO; ureteral calculus in the upper portion of the distal right ureter with significant dilation of the ureter and collecting system proximally  Indications:  73 y.o. male with history of nephrolithiasis presents for surgical management of a right ureteral calculus. He underwent an urgent left nephrostomy tube placement for an obstructing stone with acute on chronic renal failure and UTI in 1/25.  He subsequently underwent cystoscopy with left retrograde pyelogram, left ureteroscopy with laser lithotripsy and insertion of a left ureteral stent and removal of the nephrostomy tube on 07/24/2023 by Dr. Mena Goes.  The left renal calculus was incompletely treated and he was scheduled for a staged procedure in several weeks.  He was preparing for discharge  from inpatient rehab today but developed left-sided flank pain.  He has had gross hematuria as well. CT imaging performed earlier this morning showed a 7-8 mm obstructing calculus in the right ureter with associated nephrosis and some left hydronephrosis despite a normal position left ureteral stent.  He had some slight worsening of his renal function with a creatinine of 4.69 this morning.  White blood  cell count elevated at 24.4. He presents now for cystoscopy, right retrograde pyelogram, insertion of right ureteral stent.  The procedure including potential risk was discussed with the patient and his wife in detail.  They understand and wish to proceed as described.  Description of Procedure:  The patient received IV Rocephin preoperatively.  He was taken to the operating room suite and properly identified.  The indwelling Foley catheter was removed.  After successful induction of a general anesthetic, he was placed in the dorsolithotomy position.  The patient's genitalia was prepped and draped in sterile fashion.  A preoperative timeout was performed.  Under direct visualization, a 21 French rigid cystoscope was passed through the urethra into the bladder.  No obvious urethral trauma was identified.  There did appear to be a small pocket in the bulbar urethra possibly from prior catheterization attempts.  No significant prostate enlargement was identified.  Upon entering the bladder, visualization was initially somewhat limited due to hematuria.  The bladder was irrigated with return of a few small clots.  Evaluation of the bladder demonstrated a left ureteral stent emanating from the left UO.  No other obvious mucosal lesions were seen.  Scout film demonstrated a calcification in the upper pelvis consistent with the known ureteral calculus.  A nonspecific bowel gas pattern was seen.  No other obvious bony abnormalities were appreciated.  I attempted catheterization of the right UO but met some resistance in the distal ureter.  A sensor guidewire was placed through the open-ended catheter but would not easily pass into the distal ureter.  After several attempts at passage of the wire, I elected to perform ureteroscopy of the distal ureter.  The semirigid ureteroscope  was passed easily into the distal ureter.  I passed a sensor guidewire through the ureteroscope and into the right renal pelvis under  fluoroscopic guidance.  Following passage of the wire, a open-ended ureteral catheter was passed over the guidewire.  Contrast was injected confirming position within the right renal pelvis.  There was dilation of the renal pelvis and the proximal ureter.  The guidewire was replaced.  A 7 French by 28 cm double-J stent was passed over the guidewire and into the right renal pelvis.  The tether was removed prior to stent placement.  Position was confirmed with fluoroscopy.  A good curl was noted proximally within the right collecting system and distally within the bladder.  The cystoscope was removed.  A 20 French Foley catheter was placed with 30 mL of sterile water in the balloon.  There was return of light pink urine without clots.  The patient was then extubated and taken to the postanesthesia care unit in stable condition.  Complications: None  Condition: Stable, extubated, transferred to PACU  Plan:  Continue bilateral ureteral stents Continue foley Gross hematuria will be expected with indwelling stents and anti-coagulation Definitive management of the right ureteral calculus and left renal calculi will be scheduled with Dr. Mena Goes in approximately 2 weeks.

## 2023-08-06 ENCOUNTER — Encounter (HOSPITAL_COMMUNITY): Payer: Self-pay | Admitting: Urology

## 2023-08-06 LAB — BASIC METABOLIC PANEL WITH GFR
Anion gap: 13 (ref 5–15)
BUN: 84 mg/dL — ABNORMAL HIGH (ref 8–23)
CO2: 10 mmol/L — ABNORMAL LOW (ref 22–32)
Calcium: 8 mg/dL — ABNORMAL LOW (ref 8.9–10.3)
Chloride: 107 mmol/L (ref 98–111)
Creatinine, Ser: 4.89 mg/dL — ABNORMAL HIGH (ref 0.61–1.24)
GFR, Estimated: 12 mL/min — ABNORMAL LOW (ref >60)
Glucose, Bld: 159 mg/dL — ABNORMAL HIGH (ref 70–99)
Potassium: 4.9 mmol/L (ref 3.5–5.1)
Sodium: 130 mmol/L — ABNORMAL LOW (ref 135–145)

## 2023-08-06 LAB — GLUCOSE, CAPILLARY: Glucose-Capillary: 164 mg/dL — ABNORMAL HIGH (ref 70–99)

## 2023-08-06 LAB — TYPE AND SCREEN
ABO/RH(D): O POS
Antibody Screen: NEGATIVE
Unit division: 0

## 2023-08-06 LAB — BPAM RBC
Blood Product Expiration Date: 202504292359
ISSUE DATE / TIME: 202504022123
Unit Type and Rh: 5100

## 2023-08-06 LAB — CBC
HCT: 26.3 % — ABNORMAL LOW (ref 39.0–52.0)
Hemoglobin: 8.2 g/dL — ABNORMAL LOW (ref 13.0–17.0)
MCH: 26.5 pg (ref 26.0–34.0)
MCHC: 31.2 g/dL (ref 30.0–36.0)
MCV: 85.1 fL (ref 80.0–100.0)
Platelets: 341 10*3/uL (ref 150–400)
RBC: 3.09 MIL/uL — ABNORMAL LOW (ref 4.22–5.81)
RDW: 16.5 % — ABNORMAL HIGH (ref 11.5–15.5)
WBC: 23.8 10*3/uL — ABNORMAL HIGH (ref 4.0–10.5)
nRBC: 0 % (ref 0.0–0.2)

## 2023-08-06 MED ORDER — FUROSEMIDE 10 MG/ML IJ SOLN
40.0000 mg | Freq: Once | INTRAMUSCULAR | Status: AC
  Administered 2023-08-06: 40 mg via INTRAVENOUS
  Filled 2023-08-06: qty 4

## 2023-08-06 MED ORDER — METHYLPREDNISOLONE ACETATE 40 MG/ML IJ SUSP
40.0000 mg | Freq: Once | INTRAMUSCULAR | Status: AC
  Administered 2023-08-06: 40 mg via INTRA_ARTICULAR
  Filled 2023-08-06: qty 1

## 2023-08-06 MED ORDER — SODIUM BICARBONATE 650 MG PO TABS
1300.0000 mg | ORAL_TABLET | Freq: Three times a day (TID) | ORAL | Status: DC
  Administered 2023-08-06 – 2023-08-21 (×41): 1300 mg via ORAL
  Filled 2023-08-06 (×44): qty 2

## 2023-08-06 MED ORDER — BUPIVACAINE HCL (PF) 0.5 % IJ SOLN
10.0000 mL | Freq: Once | INTRAMUSCULAR | Status: AC
  Administered 2023-08-06: 10 mL
  Filled 2023-08-06: qty 10

## 2023-08-06 NOTE — Procedures (Signed)
 Procedure: Right knee aspiration and injection   Indication: Right knee effusion(s)   Surgeon: Charma Igo, PA-C   Assist: None   Anesthesia: Topical refrigerant   EBL: None   Complications: None   Findings: After risks/benefits explained patient desires to undergo procedure. Consent obtained and time out performed. The right knee was sterilely prepped and aspirated. 5ml yellow fluid obtained. 40mg  depo-medrol and 6ml 0.5% Marcaine instilled. Pt tolerated the procedure well.       Freeman Caldron, PA-C Orthopedic Surgery [phone]

## 2023-08-06 NOTE — Progress Notes (Signed)
 Patient in bed with wife a t bedside at shift change. He is drowsy but easily awakened. He denied pain and had no signs of distress. I educated to pt and wife he will need a blood transfusion. Wife verbalized understanding and denied any previous reactions during transfusions. Consent is in chart. Patient and wife denied questions. Spoke with blood bank and they reported needing an order for transfuse and release. Charge nurse spoke with physician regarding this order. It was late found the order is in chart and no further action needed by physician. Blood and patient confirmed with second nurse. Pre-transfusion vitals taken, 15 minute post start of transfusion vitals taken and post transfusion vitals take all recorded in chart. Patient tolerated transfusion well with no  reaction. Once transfusion was completed sodium bicarbonate in Sterile water was started IV at 49ml/hr.  Patient is tolerating infusion well at this time.  He has denied pain this shift.  Will continue to monitor.

## 2023-08-06 NOTE — Hospital Course (Addendum)
  Calvin Schultz is a 73 y.o. male with past medical history of atrial fibrillation on Eliquis, hypertension, diabetes mellitus type 2, CKD who was recently admitted hospital for acute renal failure and left-sided hydronephrosis secondary to staghorn calculus with left UPC stricture underwent nephrostomy placement.  Rehospitalization from 2/21-3/7 at [facility] with concern for septic shock initially on norepinephrine drip related to colitis of the descending colon. C. difficile antigen and toxin positive.  Patient was noted to have AKI superimposed on CKD stage IV for which nephrology was consulted and patient was placed on CRRT.  Patient was subsequently transferred to inpatient rehab.    Patient had been complaining of left flank pain cough with productive sputum.    A CT scan of the abdomen pelvis was obtained which noted bilateral acute obstructive uropathy with evidence of bilateral collecting system hematuria with large 7- 8 mm diameter obstructing calculus of the right ureter at the pelvic inlet and new left hydronephrosis despite normally positioned double-J ureteral stent, bladder wall inflammation, anasarca, trace pelvic ascites, nodular liver contour giving concern for cirrhosis   Obstructive uropathy Left AKI on CKD stage IV Metabolic acidosis Creatinine today at 4.8 trending up from 4.6.  CO2 10.  Patient had previously been on CRRT which was stopped on 3/19.    CT revealed A CT scan of the abdomen pelvis was obtained which noted bilateral acute obstructive uropathy with evidence of bilateral collecting system hematuria with large 7- 8 mm diameter obstructing calculus of the right ureter at the pelvic inlet and new left hydronephrosis despite normally positioned double-J ureteral stent.  Urology was consulted and patient underwent cystoscopy with right retrograde pyelogram and insertion of right ureteral stent on 08/05/2023.  Patient did have staged treatment with Dr. Mena Goes for left obstructive  ureteral stone on 07/24/2023.  Urine culture with yeast.  On bicarb drip.   Leukocytosis Acute on chronic.  White blood cell count trending up to 23.8 from 24.4 today.  Afebrile.  Will continue to monitor closely.  Has been started on Keflex.  Urine culture showing yeast could be colonization.  Normocytic anemia Hemoglobin today at 8.2.  Received 1 unit of packed RBC during hospitalization.  Continue to monitor.   Atrial fibrillation/flutter Had hematuria so Eliquis on hold.  Rate controlled at this time.   History of C diff colitis Patient had been treated for C. difficile last month.  Improvement of symptoms at this time   Controlled diabetes mellitus type 2  Hemoglobin A1c of 6.2 on 06/2023.  Continue sliding scale insulin Accu-Cheks.  Orthostatic hypotension -Continue midodrine prn   Debility, deconditioning. Undergoing rehabilitation.

## 2023-08-06 NOTE — Progress Notes (Signed)
 PROGRESS NOTE   Subjective/Complaints: Patient reports he continues to feel fatigued today.  Patient had cystoscopy, right retrograde pyelogram, right ureteral stent insertion yesterday by Dr. Pete Glatter.  Flank pain has resolved.  Continues to have pain in his right knee.  Wife reports she does not want him to go to SNF.  Wife would like team meeting, this was scheduled for Tuesday at 1 PM.   ROS: Patient denies fever, new vision changes, dizziness, nausea, vomiting shortness of breath or chest pain, headache, or mood change. Diarrhea- improved Knee pain-left improved, right ongoing Hematuria continued + fatigue     Objective:   DG C-Arm 1-60 Min Result Date: 08/05/2023 CLINICAL DATA:  Cystoscopy and retrograde pyelogram EXAM: DG C-ARM 1-60 MIN COMPARISON:  08/05/2023 FINDINGS: Eight fluoroscopic images are obtained during the performance of the procedure and are provided for interpretation only. The right ureter is cannulated and opacified, with placement of a right ureteral stent. Proximal aspect coiled within the right renal pelvis and distal aspect coiled within the bladder. Please refer to the operative report. Fluoroscopy time: 1 minute 59 seconds, 52.14 mGy IMPRESSION: 1. Intraoperative exam as above. Please refer to the operative report. Electronically Signed   By: Sharlet Salina M.D.   On: 08/05/2023 20:28   CT ABDOMEN PELVIS WO CONTRAST Result Date: 08/05/2023 CLINICAL DATA:  73 year old male with gross hematuria. No nephrolithiasis. Left nephrostomy in February. EXAM: CT ABDOMEN AND PELVIS WITHOUT CONTRAST TECHNIQUE: Multidetector CT imaging of the abdomen and pelvis was performed following the standard protocol without IV contrast. RADIATION DOSE REDUCTION: This exam was performed according to the departmental dose-optimization program which includes automated exposure control, adjustment of the mA and/or kV according to patient  size and/or use of iterative reconstruction technique. COMPARISON:  Noncontrast CT Abdomen and Pelvis 06/26/2023. FINDINGS: Lower chest: Stable cardiomegaly. No pericardial effusion. But small volume simple fluid density right pleural effusion is new since February. Contralateral left lung base fibrothorax and pleural thickening appears unchanged. Hepatobiliary: Cholelithiasis. Trace new perihepatic free fluid with simple fluid density. Mildly nodular liver contour again noted. No discrete liver lesion in the absence of contrast. Pancreas: Atrophied. Spleen: Stable and within normal limits. Adrenals/Urinary Tract: Adrenal glands remain within normal limits. Left side percutaneous nephrostomy seen in February has been removed but a left double-J ureteral stent is in place with appropriate positioning. Superimposed developing staghorn calculus in the left midpole renal calices is noted and there is moderate increased left hydronephrosis with hyperdense fluid within the dilated collecting system (series 6, image 41). Chronic but increased perinephric soft tissue stranding. No calculus identified along the course of the double-J stent. Contralateral new moderate to severe right hydronephrosis and severe right hydroureter (up to 2 cm diameter). And there is hyperdense fluid, layering hematocrit also within the dilated right renal collecting system and pelvis (series 3, image 39). Increased right pararenal space inflammation and stranding. The right ureter remains dilated to the level of the pelvic inlet where a large obstructing 7-8 mm diameter calculus is noted on series 3, image 70 and coronal image 53. Distal to that the right ureter is decompressed to the bladder. Foley catheter and distal double-J stent are  present within the bladder. Small volume of gas within the bladder. Bladder wall is mildly thickened and indistinct. Stomach/Bowel: Increased large bowel retained stool since February, mild-to-moderate. No  dilated large or small bowel loops. Evidence that the appendix remains diminutive and normal on series 3, image 59. Small volume retained fluid in the stomach. No pneumoperitoneum. Bilateral retroperitoneal, pericolic gutter inflammatory stranding which probably is emanating from the renal spaces. This is confluent in the left lower quadrant series 3, image 72, but adjacent bowel seems unaffected. Vascular/Lymphatic: Aortoiliac calcified atherosclerosis. Normal caliber abdominal aorta. Vascular patency is not evaluated in the absence of IV contrast. No lymphadenopathy identified. Reproductive: Urethral catheter in place. Other: Mild to moderate presacral edema. Musculoskeletal: Moderate to severe new bilateral body wall, flank subcutaneous edema. Widespread lower thoracic through upper lumbar spinal ankylosis from bridging endplate osteophytes. No acute osseous abnormality identified. IMPRESSION: 1. Bilateral Acute Obstructive Uropathy with evidence of bilateral collecting system Hematuria. Large 7-8 mm diameter obstructing calculus of the right ureter at the pelvic inlet. New left hydronephrosis despite normally positioned left double-J ureteral stent. Left nephrostomy tube has been removed since February. Underlying left renal staghorn calculus. Associated bilateral pararenal space inflammation tracking in the retroperitoneum to the pelvis. 2. Foley catheter and distal double-J stent within the Urinary Bladder which appears mildly inflamed. 3. Superimposed new Anasarca: Body wall edema, trace perihepatic ascites (see #4), trace layering right pleural effusion. 4. Nodular liver contour raising the possibility of Cirrhosis. Chronic cholelithiasis. 5. Cardiomegaly. Left lung base fibrothorax. Aortic Atherosclerosis (ICD10-I70.0). Electronically Signed   By: Odessa Fleming M.D.   On: 08/05/2023 06:36      Recent Labs    08/05/23 0527 08/06/23 0607  WBC 24.4* 23.8*  HGB 7.3* 8.2*  HCT 23.8* 26.3*  PLT 372 341      Recent Labs    08/05/23 0916 08/06/23 0607  NA 132* 130*  K 4.3 4.9  CL 110 107  CO2 11* 10*  GLUCOSE 87 159*  BUN 81* 84*  CREATININE 4.69* 4.89*  CALCIUM 8.1* 8.0*      Intake/Output Summary (Last 24 hours) at 08/06/2023 1251 Last data filed at 08/06/2023 0700 Gross per 24 hour  Intake 1340 ml  Output 550 ml  Net 790 ml     Pressure Injury 07/10/23 Buttocks Right;Mid;Upper Stage 2 -  Partial thickness loss of dermis presenting as a shallow open injury with a red, pink wound bed without slough. Pink opened stage 2 right upper buttock (Active)  07/10/23 1608  Location: Buttocks  Location Orientation: Right;Mid;Upper  Staging: Stage 2 -  Partial thickness loss of dermis presenting as a shallow open injury with a red, pink wound bed without slough.  Wound Description (Comments): Pink opened stage 2 right upper buttock  Present on Admission: Yes     Pressure Injury 07/10/23 Buttocks Right;Mid;Upper Stage 2 -  Partial thickness loss of dermis presenting as a shallow open injury with a red, pink wound bed without slough. Pink stage 2 directly beside the right upper stage 2 (Active)  07/10/23 1609  Location: Buttocks  Location Orientation: Right;Mid;Upper  Staging: Stage 2 -  Partial thickness loss of dermis presenting as a shallow open injury with a red, pink wound bed without slough.  Wound Description (Comments): Pink stage 2 directly beside the right upper stage 2  Present on Admission: Yes     Pressure Injury 07/10/23 Buttocks Right;Mid;Lower Stage 2 -  Partial thickness loss of dermis presenting as a shallow open injury  with a red, pink wound bed without slough. Pink stage 2 to right buttock below the upper stage 2 (Active)  07/10/23 1610  Location: Buttocks  Location Orientation: Right;Mid;Lower  Staging: Stage 2 -  Partial thickness loss of dermis presenting as a shallow open injury with a red, pink wound bed without slough.  Wound Description (Comments): Pink stage  2 to right buttock below the upper stage 2  Present on Admission: Yes     Pressure Injury 07/10/23 Buttocks Left;Mid;Lower Stage 2 -  Partial thickness loss of dermis presenting as a shallow open injury with a red, pink wound bed without slough. Pink stage 2 to left buttock (Active)  07/10/23 1611  Location: Buttocks  Location Orientation: Left;Mid;Lower  Staging: Stage 2 -  Partial thickness loss of dermis presenting as a shallow open injury with a red, pink wound bed without slough.  Wound Description (Comments): Pink stage 2 to left buttock  Present on Admission: Yes    Physical Exam: Vital Signs Blood pressure 109/70, pulse (!) 59, temperature 97.7 F (36.5 C), temperature source Oral, resp. rate 17, height 5\' 9"  (1.753 m), weight 106.6 kg, SpO2 98%.  Constitutional: No distress . Vital signs reviewed.  Sitting in bed. Obese, Appears comfortable HEENT: NCAT, EOMI, oral membranes moist Neck: supple Cardiovascular: RRR Respiratory/Chest: CTA Bilaterally without wheezes or rales. Normal effort    GI/Abdomen: BS +, non-tender, non-distended Ext: no clubbing, cyanosis, + 2-3 + B/L LE and 2+ b/l UE edema  GU: Small amount of Bloody urine in Foley bag  Psych: pleasant and cooperative Skin: No evidence of breakdown, no evidence of rash Neurologic: Cranial nerves II through XII grossly intact, motor strength is 4/5 in rght 3- left  deltoid, bicep, tricep, grip,3- RIght 4- left  hip flexor, knee extensors, 4/4 B ankle dorsiflexor and plantar flexor.  C/W today's exam 08/06/2023.    MSK: Bilateral knee effusions ongoing Left shoulder tight in ER/IR/F/E            Assessment/Plan: 1. Functional deficits which require 3+ hours per day of interdisciplinary therapy in a comprehensive inpatient rehab setting. Physiatrist is providing close team supervision and 24 hour management of active medical problems listed below. Physiatrist and rehab team continue to assess barriers to  discharge/monitor patient progress toward functional and medical goals  Care Tool:  Bathing  Bathing activity did not occur:  (patient completed a simulated task at bed LOF) Body parts bathed by patient: Right arm, Left arm, Chest, Abdomen, Front perineal area, Right upper leg, Left upper leg, Face   Body parts bathed by helper: Buttocks, Left lower leg, Right lower leg     Bathing assist Assist Level: Maximal Assistance - Patient 24 - 49%     Upper Body Dressing/Undressing Upper body dressing   What is the patient wearing?: Pull over shirt    Upper body assist Assist Level: Contact Guard/Touching assist    Lower Body Dressing/Undressing Lower body dressing      What is the patient wearing?: Pants, Underwear/pull up, Incontinence brief     Lower body assist Assist for lower body dressing: Maximal Assistance - Patient 25 - 49%     Toileting Toileting    Toileting assist Assist for toileting: 2 Helpers     Transfers Chair/bed transfer  Transfers assist  Chair/bed transfer activity did not occur: Safety/medical concerns (not safe to get up)  Chair/bed transfer assist level: Maximal Assistance - Patient 25 - 49%     Locomotion Ambulation  Ambulation assist   Ambulation activity did not occur: Safety/medical concerns  Assist level: Dependent - Patient 0% Assistive device: Other (comment) (sara plus) Max distance: 18 steps   Walk 10 feet activity   Assist  Walk 10 feet activity did not occur: Safety/medical concerns        Walk 50 feet activity   Assist Walk 50 feet with 2 turns activity did not occur: Safety/medical concerns         Walk 150 feet activity   Assist Walk 150 feet activity did not occur: Safety/medical concerns         Walk 10 feet on uneven surface  activity   Assist Walk 10 feet on uneven surfaces activity did not occur: Safety/medical concerns         Wheelchair     Assist Is the patient using a  wheelchair?: Yes Type of Wheelchair: Power    Wheelchair assist level: Supervision/Verbal cueing Max wheelchair distance: 300+    Wheelchair 50 feet with 2 turns activity    Assist        Assist Level: Supervision/Verbal cueing   Wheelchair 150 feet activity     Assist      Assist Level: Supervision/Verbal cueing   Blood pressure 109/70, pulse (!) 59, temperature 97.7 F (36.5 C), temperature source Oral, resp. rate 17, height 5\' 9"  (1.753 m), weight 106.6 kg, SpO2 98%.   Medical Problem List and Plan: 1. Functional deficits secondary to debility related to septic shock/C. difficile colitis/AKI superimposed on CKD stage IV             -patient may shower with tubes/drains covered             -ELOS/Goals:  3/28, min assist PT/OT             -Continue CIR therapies including PT, OT   -Team meeting scheduled for Tuesday  -Discussed with plan with patient and wife on rounds and then called wife later this morning for continued discussion.  Will plan team meeting for Tuesday at 1 PM.  -Daily therapy for now   2.  Antithrombotics: -DVT/anticoagulation:  Pharmaceutical: Eliquis             -antiplatelet therapy: N/A   3. Pain Management: tramadol prn, Zanaflex 4 mg twice daily as needed   Biliateral OA of knees  -ordered bilateral neoprine knee sleeves --try today  -voltaren gel tid to knees  -have scheduled tramadol 50mg  at 0700 and 1200 daily. Continue prn as well  Mild adhesive capsulitis left shoulder---  sports cream. Can use voltaren gel also. Aggressive ROM with therapy and while in bed--continue  -Will check right knee x-ray, left knee x-ray on 3/8 with advanced OA.  Will consult orthopedics for cortisone injections  -3-28: Orthopedics aspirated and injected left knee, with improvement.  Ongoing pain with right knee.  3/31 Knee fluid calcium pyrophosphate cyrystals, pseudogout?, cultures with no growth  -4/2 hydrocodone PRN started yesterday for flank  pain  4/3 reconsult orthopedics, will come up and talk to him today about his right knee.  I had spoken with orthopedics on 4/1 are considering oral steroids due to crystals, pseudogout?  But held off due to more acute medical issues    4. Mood/Behavior/Sleep: Trazodone 50 mg nightly as needed.  Provide emotional support             -antipsychotic agents: N/A   5. Neuropsych/cognition: This patient is capable of making decisions on his own behalf.  6. Skin/Wound Care: Routine skin checks   7. Fluids/Electrolytes/Nutrition:    -3/14 po intake remains poor, albumin low, weights falling  -will ask RD for assistance. Have spoken to patient about intake  -?IV albumin (see below)  3/18 discussed with dietitian, patient recorded is eating 0% of his meals.  Appears that wife bringing in 3 meals a day and he is eating about 75%, appears to have fairly adequate intake.  3/27 eating frequent outside foods-doesn't like hospital food as much  8.  Oliguric AKI superimposed on CKD stage IV.  Baseline creatinine 4.9.  CRRT discontinued 2/25.  No current plan for long-term dialysis.  Follow-up renal services              - Per discussion with nephrology Dr. Verna Czech, keep Foley catheter in to allow accurate I's and O's through Monday; then, can remove and do DC Foley trial while continuing to document strict outputs             - Continue daily assessments of renal function per nephrology   3/8- per renal, Cr leveling out- and con't Foley- will remove Monday  -nephrology following, Cr remains in 4's.   3/14 renal function with some improvement today     -suspect lower extremity edema is related to renal function/low albumin   Appreciate nephro input   -3/17 BUN/creatinine slightly improved 34/3.31, appreciate nephrology assistance  3/19 BUN/CR stable at 34/3.24, nephrology signing off, ok to check BMET twice a week  3/23 encouraging po this weekend. happy that he's eating food from home  3/24 BUN/CR  overall stable at 40/3.26  -3/31 BUN/Cr a little higher today, IVF 1ml/hr  -4/1 Cr a little higher again, will contact nephrology  -4/3 nephrology note reviewed, IV Lasix discontinued and he was given some Lasix today, changed oral bicarb by nephrology, can give IV as needed  9.  C. difficile colitis.  Completed course of oral vancomycin 3/7.  DC'd IV Flagyl 3/3. Enteric precautions   3/14 stools formed this morning   3/17 discussed with ID pharmacy, patient having more frequent bowel movements.  Monitor today and if continues consider Dificid treatment  3/18 diarrhea appears to be improving, continue to monitor for now  3/19 Frequent Bms improved, continue to follow  3/21 having more frequent bowel movements again today, continue to monitor and if frequent diarrhea persists this weekend consider treatment with Dificid  3/23- mushy/lqiuid stool again overnight    -no dificid at this point. Discuss with GI first on Monday    -added fiber to regimen yesterday, will increase to bid today  3/24 mushy/ liquid BMs, about 2 a day yesterday, more other days- will discuss GI  3/25 GI recommended repeat C. difficile screening. Discussed with [facility], hold off on repeat screening test, he only had about 2 bowel movements a day but if continues to have frequent BMs consider retreatment with oral vancomycin for 10 days  3/26 pt reports Bms more firm, continue to monitor   3/28 patient reports BMs have been more solid, continue to monitor.  If worsening diarrhea occurs consider retreatment with vancomycin oral for 10 days  3-29: Diarrhea this a.m., no bowel movements this afternoon recorded  3-30: No BM since yesterday.  3-31 diarrhea continues to be improved overall  4-3 LBM yesterday, reports diarrhea continues to be improved  10.  Status post left nephrostomy tube d/t staghorn calculi. Has Foley Nephrostomy tube placed 1/29 per IR with plan for ureteric stent placement  3/21              -  Careful monitoring of nephrostomy output  -3/14 flomax has been added to improve emptying  -3/21 patient scheduled today for cystoscopy with left retrograde pyelogram, left ureteroscopy laser lithotripsy and left ureteral stent placement.     3/22- procedure appears successful thus far.    -urine clear, patient comfortable   -foley to come out on Monday per urology  3/25 Foley was removed patient continues to have urinary retention requiring IC.  Increase Flomax to 0.8 mg  3/26 intermittent blood noted with IC, if continues will restart foley  3/27 Urine no longer blood tinged, continue current regimen, consider restart foley if reoccurs  3/27 continues to require catheterization but no longer blood-tinged Addendum 3/28 foley started after traumatic cath, possible false passage, continue foley for now, f/u with urology outpatient 4/1 foley with hematuria, denies known trauma to this, will ask nursing to check bladder scan. U/A culture ordered.  Called his urologist to discuss 4/2 CT abdomen/pelvis-bilateral obstructive uropathy with collecting system hematuria, large calculus in right ureter and pelvic inlet, new left hydronephrosis, new anasarca, liver nodules raising possibility of cirrhosis.  Discussed patient with urology - surgical procedure today  Addendum discussed with urology, will hold eliquis for now due to bleeding, check EKG- A flutter -4/3 cystoscopy, right retrograde pyelogram, right ureteral stent insertion yesterday by Dr. Pete Glatter, further procedure scheduled on 4/15 with Dr. Mena Goes.  Discussed with [facility] urology.  Do not think traumatic cath last week caused recurrent bleeding his kidney, his wife was concerned about this.  Patient was having nonbloody urine for several days on the Foley.  Will continue Foley and not attempt intermittent cath.  Patient suspected to have false passage.   11.  Chronic atrial fibrillation with episodes of bradycardia.  Followed by  cardiology services.  Continue Eliquis.  Avoid AV nodal blocking agents   12.  Diabetes mellitus.  Hemoglobin A1c 6.2.    -tightly controlled. Dc SSI and change cbg checks to qam only   CBG (last 3)  Recent Labs    08/05/23 1725 08/05/23 2137 08/06/23 0616  GLUCAP 92 148* 164*       3-30: Recently increased.  Somewhat expected with intra-articular steroid injection.  Continue to trend.  4/3 controlled continue current regimen  13.  Hypotension.  ProAmatine 10 mg 3 times daily.  Monitor with increased mobility   - Normotensive on admission  3/9- BP running 100-s to 120s systolic- con't regimen  3/19 BP has been on high side, will change midodrine to 5mg  TID PRN orthostatic hypotension  3/28 BP stable, continue current regimen  -4/3 BP continue current regimen and monitor     08/06/2023    6:19 AM 08/06/2023    5:00 AM 08/06/2023   12:45 AM  Vitals with BMI  Weight  235 lbs   BMI  34.69   Systolic 109  106  Diastolic 70  54  Pulse 59  67    14.  Chronic anemia.  Niferex daily/Aranesp.  Monitor for any bleeding episodes   3/8- Hb 7.1 today- will order transfusion- transfusion of 1 unit pRBCs.   3/14 hgb up to 9.1  -3/17 hemoglobin down to 8.4, continue to monitor  -3/19 HGB 8.3 today, stable overall, continue to monitor    -3/27 HGB 7.7- a little lower likely due to GU procedure/hematuria, continue to monitor  3/31 HGB 7.6 today, transfuse 7 or less  4/3 hemoglobin stable today after transfusion yesterday  16. Leukocytosis  3/9- Pt's WBC is 21.7- up from 16k- is afebrile- due to his recent C diff, that just finished correct dosing of ABX for, called ID for guidance on treatment- don't want to cause more Cdiff- ID consulted ---no new orders  3/10 WBC's down to 17k  --have been hovering above and below 20 for awhile. No new clinical symptoms. Nephrostomy drainage clear, yellow  3/14 wbc's down to 13k.--recheck Monday   3/17 WBC is down to 12.4  3/19 WBC 13.4 today, continue to  monitor  3/24 WBC higher today 18.7, Discuss C diff treatment with GI a above 3/31 WBC still elevated 19.0, monitor for signs of infection 4/3 WBC still elevated to 23.8, potentially due to his urological issues and procedure.  Hospitalist also following appreciate assistance  17. Decreased mood reported by therapy  -Consider SSRI  -3/20 patient reports his mood is okay, denies depression this morning.  Continue to monitor  18. Insomnia   -3/20 schedule trazodone and increase to 75mg  at bedtime  -3/21 insomnia improved but bowel movements at night did wake him up a few times  3/23 sleeping better when stooling isn't an issue  19. LE edema  -Check albumin/cmp with next labs. Leg elevation and compression   -Significantly improved on the left after steroid injection and effusion tap.  -IVF discontinued as above  LOS: 27 days A FACE TO FACE EVALUATION WAS PERFORMED  Fanny Dance 08/06/2023, 12:51 PM

## 2023-08-06 NOTE — Progress Notes (Signed)
 1 Day Post-Op Subjective: Pt resting in bed on my arrival. He was accompanied by his wife. Urine was a clear dark red.   Objective: Vital signs in last 24 hours: Temp:  [97.2 F (36.2 C)-97.9 F (36.6 C)] 97.7 F (36.5 C) (04/03 0619) Pulse Rate:  [59-86] 59 (04/03 0619) Resp:  [15-19] 17 (04/03 0619) BP: (104-132)/(51-70) 109/70 (04/03 0619) SpO2:  [97 %-100 %] 98 % (04/03 0619) Weight:  [105.3 kg-106.6 kg] 106.6 kg (04/03 0500)  Assessment/Plan: #left obstructing ureteral stone S/p first step of staged treatment with Dr. Mena Goes 07/24/23  #right obstructing ureteral stone The day before discharging home (4/2) an existing right side stone obstructed and pt underwent cysto/stent placement w/ Dr. Pete Glatter.   Definitive stone mgmt with Dr. Mena Goes on 4/15.   #Hematuria Previous bleeding from traumatic foley In/Out reprted to have resolved prior to spontaneous renal bleed.   CT A/P shows some presumed blood products in the right kidney. This side had not undergone any procedures at the time of the bleed. No clear cause identified. No active bleeding noted in bladder/prostate at time of cysto. False passage noted.  Ongoing hematuria today. Hold blood thinners if medically reasonable.   Intake/Output from previous day: 04/02 0701 - 04/03 0700 In: 1340 [P.O.:320; I.V.:600; Blood:370; IV Piggyback:50] Out: 850 [Urine:850]  Intake/Output this shift: No intake/output data recorded.  Physical Exam:  General: Alert and oriented CV: No cyanosis Lungs: equal chest rise Gu: foley catheter in placed draining clear dark red/brown urine  Lab Results: Recent Labs    08/04/23 1939 08/05/23 0527 08/06/23 0607  HGB 7.7* 7.3* 8.2*  HCT 25.2* 23.8* 26.3*   BMET Recent Labs    08/05/23 0916 08/06/23 0607  NA 132* 130*  K 4.3 4.9  CL 110 107  CO2 11* 10*  GLUCOSE 87 159*  BUN 81* 84*  CREATININE 4.69* 4.89*  CALCIUM 8.1* 8.0*     Studies/Results: DG C-Arm 1-60  Min Result Date: 08/05/2023 CLINICAL DATA:  Cystoscopy and retrograde pyelogram EXAM: DG C-ARM 1-60 MIN COMPARISON:  08/05/2023 FINDINGS: Eight fluoroscopic images are obtained during the performance of the procedure and are provided for interpretation only. The right ureter is cannulated and opacified, with placement of a right ureteral stent. Proximal aspect coiled within the right renal pelvis and distal aspect coiled within the bladder. Please refer to the operative report. Fluoroscopy time: 1 minute 59 seconds, 52.14 mGy IMPRESSION: 1. Intraoperative exam as above. Please refer to the operative report. Electronically Signed   By: Sharlet Salina M.D.   On: 08/05/2023 20:28   CT ABDOMEN PELVIS WO CONTRAST Result Date: 08/05/2023 CLINICAL DATA:  73 year old male with gross hematuria. No nephrolithiasis. Left nephrostomy in February. EXAM: CT ABDOMEN AND PELVIS WITHOUT CONTRAST TECHNIQUE: Multidetector CT imaging of the abdomen and pelvis was performed following the standard protocol without IV contrast. RADIATION DOSE REDUCTION: This exam was performed according to the departmental dose-optimization program which includes automated exposure control, adjustment of the mA and/or kV according to patient size and/or use of iterative reconstruction technique. COMPARISON:  Noncontrast CT Abdomen and Pelvis 06/26/2023. FINDINGS: Lower chest: Stable cardiomegaly. No pericardial effusion. But small volume simple fluid density right pleural effusion is new since February. Contralateral left lung base fibrothorax and pleural thickening appears unchanged. Hepatobiliary: Cholelithiasis. Trace new perihepatic free fluid with simple fluid density. Mildly nodular liver contour again noted. No discrete liver lesion in the absence of contrast. Pancreas: Atrophied. Spleen: Stable and within normal limits. Adrenals/Urinary  Tract: Adrenal glands remain within normal limits. Left side percutaneous nephrostomy seen in February has  been removed but a left double-J ureteral stent is in place with appropriate positioning. Superimposed developing staghorn calculus in the left midpole renal calices is noted and there is moderate increased left hydronephrosis with hyperdense fluid within the dilated collecting system (series 6, image 41). Chronic but increased perinephric soft tissue stranding. No calculus identified along the course of the double-J stent. Contralateral new moderate to severe right hydronephrosis and severe right hydroureter (up to 2 cm diameter). And there is hyperdense fluid, layering hematocrit also within the dilated right renal collecting system and pelvis (series 3, image 39). Increased right pararenal space inflammation and stranding. The right ureter remains dilated to the level of the pelvic inlet where a large obstructing 7-8 mm diameter calculus is noted on series 3, image 70 and coronal image 53. Distal to that the right ureter is decompressed to the bladder. Foley catheter and distal double-J stent are present within the bladder. Small volume of gas within the bladder. Bladder wall is mildly thickened and indistinct. Stomach/Bowel: Increased large bowel retained stool since February, mild-to-moderate. No dilated large or small bowel loops. Evidence that the appendix remains diminutive and normal on series 3, image 59. Small volume retained fluid in the stomach. No pneumoperitoneum. Bilateral retroperitoneal, pericolic gutter inflammatory stranding which probably is emanating from the renal spaces. This is confluent in the left lower quadrant series 3, image 72, but adjacent bowel seems unaffected. Vascular/Lymphatic: Aortoiliac calcified atherosclerosis. Normal caliber abdominal aorta. Vascular patency is not evaluated in the absence of IV contrast. No lymphadenopathy identified. Reproductive: Urethral catheter in place. Other: Mild to moderate presacral edema. Musculoskeletal: Moderate to severe new bilateral body  wall, flank subcutaneous edema. Widespread lower thoracic through upper lumbar spinal ankylosis from bridging endplate osteophytes. No acute osseous abnormality identified. IMPRESSION: 1. Bilateral Acute Obstructive Uropathy with evidence of bilateral collecting system Hematuria. Large 7-8 mm diameter obstructing calculus of the right ureter at the pelvic inlet. New left hydronephrosis despite normally positioned left double-J ureteral stent. Left nephrostomy tube has been removed since February. Underlying left renal staghorn calculus. Associated bilateral pararenal space inflammation tracking in the retroperitoneum to the pelvis. 2. Foley catheter and distal double-J stent within the Urinary Bladder which appears mildly inflamed. 3. Superimposed new Anasarca: Body wall edema, trace perihepatic ascites (see #4), trace layering right pleural effusion. 4. Nodular liver contour raising the possibility of Cirrhosis. Chronic cholelithiasis. 5. Cardiomegaly. Left lung base fibrothorax. Aortic Atherosclerosis (ICD10-I70.0). Electronically Signed   By: Odessa Fleming M.D.   On: 08/05/2023 06:36      LOS: 27 days   Elmon Kirschner, NP [facility] Pager: [phone]  08/06/2023, 11:15 AM

## 2023-08-06 NOTE — Patient Care Conference (Signed)
 Inpatient RehabilitationTeam Conference and Plan of Care Update Date: 08/05/2023   Time: 12:02 PM    Patient Name: Calvin Schultz      Medical Record Number: [id number]  Date of Birth: 21-Jun-1950 Sex: Male         Room/Bed: 4M01C/4M01C-01 Payor Info: Payor: [employer] / Plan: Drew PREFERRED / Product Type: *No Product type* /    Admit Date/Time:  07/10/2023  2:55 PM  Primary Diagnosis:  Right ureteral calculus  Hospital Problems: Principal Problem:   Right ureteral calculus Active Problems:   Acute kidney injury superimposed on chronic kidney disease (HCC)   Chronic atrial fibrillation (HCC)   Normocytic anemia   Metabolic acidosis   Debility   Pressure injury of skin   Leukocytosis   History of Clostridium difficile colitis   Controlled diabetes mellitus type 2 with complications [facility])    Expected Discharge Date: Expected Discharge Date:  (??)  Team Members Present: Physician leading conference: Dr. Fanny Dance Social Worker Present: Dossie Der, LCSW Nurse Present: Chana Bode, RN PT Present: Ambrose Finland, PT OT Present: Candee Furbish, OT SLP Present: Jeannie Done, SLP PPS Coordinator present : Fae Pippin, SLP     Current Status/Progress Goal Weekly Team Focus  Bowel/Bladder   Patient continues to have soft incontinent BM's several times per day. He has coude catheter that was inserted 3/28. His urine is dark bloody in foley      Manage bowel w mod I assist ; off precautions and manage bladder w min assist    Monitor urinary output; color and amount. Urology consulted -Cystoscopy scheduled due to hydronephrosis and kidney stones  Swallow/Nutrition/ Hydration               ADL's   Set-up UB, Max A LB bathing, LB dressing. Mod A 1-2 person for SBA transfers.   goals recently downgraded. Max A: toileting, Mod A Bathing, LB dressing. Removed sit to stand, tub/shower, and dynamic standing balance goals.   Continue working on SB transfers including to drop arm  BSC. Core strength, lateral weight shifting    Mobility   level of assist overall fluctuates with pain/fatigue/lack of sleep. bed mobility sup-mod A with use of bed features, downhill slide board transfer min-heavy mod A; sit to stand to steady with +2 max A; therapist concerned about safety with discharge home. WC mobility with power WC and supervision--especially with fatigue, Pt and wife considering D/C to SNF on 4/1,   downgraded goals  follow up HHPT, DME: power WC, slide board, hoyer lift, non emergency medical transport. recommending WC accessible Lawyer Observations               Pain   Patient was started on PRN norco 4/1 for knee pain. He rates knee pain at 8-10/10, Norco has decreased his pain to 2-3/10.            Skin   Patient has stage II to coccyx/bilateral glutes. He has a skin tear to his right elbow. He has preventative foam to bilateral heals. He is Q 2 hour turns at night.             Discharge Planning:  Wife has been here and done hands on education quite a bit of care and now goals have been downgraded. Discussion regarding home versus SNF   Team Discussion: Patient with leukocytosis; ID following. Hematuria,  anemia; foley replaced. Cystoscopy ordered for hydronephrosis/kidney stones with increase in hematuria with anemia. Fluctuation in functional status due to medical issues, knee pain/flank pain, and edema. ? Pseudogout flare and continued - C-diff precautions.  Patient on target to meet rehab goals: no, goals were downgraded goals to mod - max assist and working on [employer] lift transfers.   *See Care Plan and progress notes for long and short-term goals.   Revisions to Treatment Plan:  Changed to q day therapy sessions, Urology consult ID following   Teaching Needs: Safety, medications, skin care, transfers, toileting, etc.   Current Barriers to Discharge: Decreased caregiver  support, Home enviroment access/layout, IV antibiotics, and Wound care  Possible Resolutions to Barriers: SNF recommendation discussion Power wheelchair ordered/hitch lift DME: hospital bed, Psychologist, sport and exercise Summary Current Status: Debility, hematuria, pain, AKI/CKD, CDIFF, DM2, anemia, leukcytosis  Barriers to Discharge: Medical stability;Infection/IV Antibiotics;Renal Insufficiency/Failure;Self-care education;Uncontrolled Pain;Symptomatic Anemia  Barriers to Discharge Comments: Debility, hematuria, pain, AKI/CKD, CDIFF, DM2, anemia, leukcytosis Possible Resolutions to [employer] Focus: Urology procedure today, possible SNF, IM consult, IVF   Continued Need for Acute Rehabilitation Level of Care: The patient requires daily medical management by a physician with specialized training in physical medicine and rehabilitation for the following reasons: Direction of a multidisciplinary physical rehabilitation program to maximize functional independence : Yes Medical management of patient stability for increased activity during participation in an intensive rehabilitation regime.: Yes Analysis of laboratory values and/or radiology reports with any subsequent need for medication adjustment and/or medical intervention. : Yes   I attest that I was present, lead the team conference, and concur with the assessment and plan of the team.   Chana Bode B 08/06/2023, 11:38 AM

## 2023-08-06 NOTE — Progress Notes (Signed)
 [facility] Progress Note   Subjective:  s/p cysto and stenting R kidney with Dr. Pete Glatter yesterday.  This AM tired but says flank pain is resolved.  Continued hematuria through foley bag. UOP .   PO intake is not great.  Worsening edema.  No uremic symptoms.  Objective Vitals:   08/05/23 2204 08/06/23 0045 08/06/23 0500 08/06/23 0619  BP: (!) 112/58 (!) 106/54  109/70  Pulse: 60 67  (!) 59  Resp: 16 16  17   Temp: (!) 97.5 F (36.4 C) 97.6 F (36.4 C)  97.7 F (36.5 C)  TempSrc: Oral Oral  Oral  SpO2: 99% 98%  98%  Weight:   106.6 kg   Height:       Physical Exam General: lying flat, comfortable lying still Heart: RRR Lungs: clear Abdomen: soft Extremities: 2+ L tibial edema 2+ R tibial, LUE 2+ RUE 1+ GU:  foley draining frankly bloody urine, no clots noted  Additional Objective Labs: Basic Metabolic Panel: Recent Labs  Lab 08/04/23 0531 08/05/23 0916 08/06/23 0607  NA 133* 132* 130*  K 4.2 4.3 4.9  CL 111 110 107  CO2 12* 11* 10*  GLUCOSE 98 87 159*  BUN 76* 81* 84*  CREATININE 4.45* 4.69* 4.89*  CALCIUM 8.2* 8.1* 8.0*  PHOS  --  7.4*  --    Liver Function Tests: Recent Labs  Lab 08/03/23 0521 08/05/23 0916  AST 19  --   ALT 27  --   ALKPHOS 145*  --   BILITOT 0.2  --   PROT 5.5*  --   ALBUMIN 2.1* 2.0*   No results for input(s): "LIPASE", "AMYLASE" in the last 168 hours. CBC: Recent Labs  Lab 08/03/23 0521 08/04/23 1939 08/05/23 0527 08/06/23 0607  WBC 19.0* 19.1* 24.4* 23.8*  HGB 7.6* 7.7* 7.3* 8.2*  HCT 25.1* 25.2* 23.8* 26.3*  MCV 87.2 86.6 87.5 85.1  PLT 368 350 372 341   Blood Culture    Component Value Date/Time   SDES URINE, CATHETERIZED 08/04/2023 1408   SPECREQUEST  08/04/2023 1408    NONE Performed at [facility], [address], [place], [place] [address]    CULT >=100,000 COLONIES/mL YEAST (A) 08/04/2023 1408   REPTSTATUS 08/05/2023 FINAL 08/04/2023 1408    Cardiac Enzymes: No results for  input(s): "CKTOTAL", "CKMB", "CKMBINDEX", "TROPONINI" in the last 168 hours. CBG: Recent Labs  Lab 08/05/23 1514 08/05/23 1646 08/05/23 1725 08/05/23 2137 08/06/23 0616  GLUCAP 70 80 92 148* 164*   Iron Studies: No results for input(s): "IRON", "TIBC", "TRANSFERRIN", "FERRITIN" in the last 72 hours. @lablastinr3 @ Studies/Results: DG C-Arm 1-60 Min Result Date: 08/05/2023 CLINICAL DATA:  Cystoscopy and retrograde pyelogram EXAM: DG C-ARM 1-60 MIN COMPARISON:  08/05/2023 FINDINGS: Eight fluoroscopic images are obtained during the performance of the procedure and are provided for interpretation only. The right ureter is cannulated and opacified, with placement of a right ureteral stent. Proximal aspect coiled within the right renal pelvis and distal aspect coiled within the bladder. Please refer to the operative report. Fluoroscopy time: 1 minute 59 seconds, 52.14 mGy IMPRESSION: 1. Intraoperative exam as above. Please refer to the operative report. Electronically Signed   By: Sharlet Salina M.D.   On: 08/05/2023 20:28   CT ABDOMEN PELVIS WO CONTRAST Result Date: 08/05/2023 CLINICAL DATA:  73 year old male with gross hematuria. No nephrolithiasis. Left nephrostomy in February. EXAM: CT ABDOMEN AND PELVIS WITHOUT CONTRAST TECHNIQUE: Multidetector CT imaging of the abdomen and pelvis was  performed following the standard protocol without IV contrast. RADIATION DOSE REDUCTION: This exam was performed according to the departmental dose-optimization program which includes automated exposure control, adjustment of the mA and/or kV according to patient size and/or use of iterative reconstruction technique. COMPARISON:  Noncontrast CT Abdomen and Pelvis 06/26/2023. FINDINGS: Lower chest: Stable cardiomegaly. No pericardial effusion. But small volume simple fluid density right pleural effusion is new since February. Contralateral left lung base fibrothorax and pleural thickening appears unchanged. Hepatobiliary:  Cholelithiasis. Trace new perihepatic free fluid with simple fluid density. Mildly nodular liver contour again noted. No discrete liver lesion in the absence of contrast. Pancreas: Atrophied. Spleen: Stable and within normal limits. Adrenals/Urinary Tract: Adrenal glands remain within normal limits. Left side percutaneous nephrostomy seen in February has been removed but a left double-J ureteral stent is in place with appropriate positioning. Superimposed developing staghorn calculus in the left midpole renal calices is noted and there is moderate increased left hydronephrosis with hyperdense fluid within the dilated collecting system (series 6, image 41). Chronic but increased perinephric soft tissue stranding. No calculus identified along the course of the double-J stent. Contralateral new moderate to severe right hydronephrosis and severe right hydroureter (up to 2 cm diameter). And there is hyperdense fluid, layering hematocrit also within the dilated right renal collecting system and pelvis (series 3, image 39). Increased right pararenal space inflammation and stranding. The right ureter remains dilated to the level of the pelvic inlet where a large obstructing 7-8 mm diameter calculus is noted on series 3, image 70 and coronal image 53. Distal to that the right ureter is decompressed to the bladder. Foley catheter and distal double-J stent are present within the bladder. Small volume of gas within the bladder. Bladder wall is mildly thickened and indistinct. Stomach/Bowel: Increased large bowel retained stool since February, mild-to-moderate. No dilated large or small bowel loops. Evidence that the appendix remains diminutive and normal on series 3, image 59. Small volume retained fluid in the stomach. No pneumoperitoneum. Bilateral retroperitoneal, pericolic gutter inflammatory stranding which probably is emanating from the renal spaces. This is confluent in the left lower quadrant series 3, image 72, but  adjacent bowel seems unaffected. Vascular/Lymphatic: Aortoiliac calcified atherosclerosis. Normal caliber abdominal aorta. Vascular patency is not evaluated in the absence of IV contrast. No lymphadenopathy identified. Reproductive: Urethral catheter in place. Other: Mild to moderate presacral edema. Musculoskeletal: Moderate to severe new bilateral body wall, flank subcutaneous edema. Widespread lower thoracic through upper lumbar spinal ankylosis from bridging endplate osteophytes. No acute osseous abnormality identified. IMPRESSION: 1. Bilateral Acute Obstructive Uropathy with evidence of bilateral collecting system Hematuria. Large 7-8 mm diameter obstructing calculus of the right ureter at the pelvic inlet. New left hydronephrosis despite normally positioned left double-J ureteral stent. Left nephrostomy tube has been removed since February. Underlying left renal staghorn calculus. Associated bilateral pararenal space inflammation tracking in the retroperitoneum to the pelvis. 2. Foley catheter and distal double-J stent within the Urinary Bladder which appears mildly inflamed. 3. Superimposed new Anasarca: Body wall edema, trace perihepatic ascites (see #4), trace layering right pleural effusion. 4. Nodular liver contour raising the possibility of Cirrhosis. Chronic cholelithiasis. 5. Cardiomegaly. Left lung base fibrothorax. Aortic Atherosclerosis (ICD10-I70.0). Electronically Signed   By: Odessa Fleming M.D.   On: 08/05/2023 06:36   Medications:  sodium bicarbonate 150 mEq in sterile water 1,150 mL infusion 75 mL/hr at 08/06/23 0252     cephALEXin  250 mg Oral Daily   Chlorhexidine Gluconate Cloth  6  each Topical BID   darbepoetin (ARANESP) injection - NON-DIALYSIS  40 mcg Subcutaneous Q Fri-1800   diclofenac Sodium  2 g Topical QID   iron polysaccharides  150 mg Oral Daily   multivitamin  1 tablet Oral QHS   Muscle Rub   Topical TID   psyllium  1 packet Oral BID   tamsulosin  0.8 mg Oral QPC supper    traMADol  50 mg Oral BID   traZODone  75 mg Oral QHS    Assessment/Plan:72yo M A fib, DM, A fib on eliquis, nephrolithiasis s/p recent PCN for staghorn calculus currently admitted to CIR for debility and nephrology is reconsulted for AKI on CKD.  **AKI on CKD 4:   Baseline kidney function 05/2023 Cr 4.1 - 5.3 f/b CKA.  After CRRT stopped his creatinine improved to nadir of 3.2 on 3/19 and has gradually trended up 3/27 3.6 > 3/31 4.2 > 4/1 4.45 > 4/2 4.7 > 4/3 4.9 after obstruction now s/p OR R stone 4/2.  In setting of worsening anasarca will hold IVF and give dose of lasix IV today.   Daily labs for now.  Strict I/Os.  Avoid hypotension and nephrotoxins.  No indications for RRT currently but discussed need may arise in the next few days.   **Anemia, normocytic:  Transfuse as needed for Hb < 7.  Having acute blood loss through foley but H/H stable.  Eliquis has been held.   **Metabolic acidosis:  worsening in context of AKI; in light of worsening overload d/c bicarb gtt and switch to oral, can give IV bicarb as needed  **A fib:  eliquis on hold for acute bleeding  **Orthostatic hypotension:  has been on midodrine for the last few weeks, BPs look ok currently.   **Debility:  on CIR, therapies on hold pending medical issues.   Will follow closely with you, please reach out with concerns.   Estill Bakes MD 08/06/2023, 11:58 AM  [facility] Pager: [phone]

## 2023-08-06 NOTE — Progress Notes (Signed)
 PROGRESS NOTE  Calvin Schultz:[id number] DOB: 1951-02-03 DOA: 07/10/2023 PCP: Gwenlyn Found, MD   LOS: 27 days   Brief narrative:   Calvin Schultz is a 73 y.o. male with past medical history of atrial fibrillation on Eliquis, hypertension, diabetes mellitus type 2, CKD who was recently admitted hospital for acute renal failure and left-sided hydronephrosis secondary to staghorn calculus with left UPC stricture underwent nephrostomy placement.  Rehospitalization from 2/21-3/7 at [facility] with concern for septic shock initially on norepinephrine drip related to colitis of the descending colon. C. difficile antigen and toxin positive.  Patient was noted to have AKI superimposed on CKD stage IV for which nephrology was consulted and patient was placed on CRRT.  Patient was subsequently transferred to inpatient rehab.    Patient had been complaining of left flank pain cough with productive sputum.    A CT scan of the abdomen pelvis was obtained which noted bilateral acute obstructive uropathy with evidence of bilateral collecting system hematuria with large 7- 8 mm diameter obstructing calculus of the right ureter at the pelvic inlet and new left hydronephrosis despite normally positioned double-J ureteral stent, bladder wall inflammation, anasarca, trace pelvic ascites, nodular liver contour giving concern for cirrhosis   Assessment/Plan: Principal Problem:   Right ureteral calculus Active Problems:   Acute kidney injury superimposed on chronic kidney disease (HCC)   Normocytic anemia   Chronic atrial fibrillation (HCC)   Debility   Metabolic acidosis   Pressure injury of skin   Leukocytosis   History of Clostridium difficile colitis   Controlled diabetes mellitus type 2 with complications (HCC)   Obstructive uropathy Left AKI on CKD stage IV Metabolic acidosis Creatinine today at 4.8 trending up from 4.6.  CO2 10.  Patient had previously been on CRRT which was stopped on 3/19.    CT  revealed A CT scan of the abdomen pelvis was obtained which noted bilateral acute obstructive uropathy with evidence of bilateral collecting system hematuria with large 7- 8 mm diameter obstructing calculus of the right ureter at the pelvic inlet and new left hydronephrosis despite normally positioned double-J ureteral stent.  Urology was consulted and patient underwent cystoscopy with right retrograde pyelogram and insertion of right ureteral stent on 08/05/2023.  Patient did have staged treatment with Dr. Mena Goes for left obstructive ureteral stone on 07/24/2023.  Urine culture with yeast.  On bicarb drip.  Will continue for now.  Check BMP in AM.   Leukocytosis Acute on chronic.  White blood cell count trending up to 23.8 from 24.4 today.  Afebrile.  Will continue to monitor closely.  Has been started on Keflex.  Urine culture showing yeast could be colonization. Will discuss with primary team and urology  Hematuria. On Foley catheter.  Urology on board.  Status post ureteric stent placement yesterday.  Off anticoagulation.  Normocytic anemia Hemoglobin today at 8.2.  Received 1 unit of packed RBC during hospitalization.  Continue to monitor.   Atrial fibrillation/flutter Had hematuria so Eliquis on hold.  Rate controlled at this time.   History of C diff colitis Patient had been treated for C. difficile last month.  Improvement of symptoms at this time   Controlled diabetes mellitus type 2  Hemoglobin A1c of 6.2 on 06/2023.  Continue sliding scale insulin Accu-Cheks.  Orthostatic hypotension -Continue midodrine prn   Debility, deconditioning. Undergoing rehabilitation.   Pressure injury right buttocks stage II, left buttocks stage II.  Present on admission.  Continue wound care. Pressure Injury  07/10/23 Buttocks Right;Mid;Upper Stage 2 -  Partial thickness loss of dermis presenting as a shallow open injury with a red, pink wound bed without slough. Pink opened stage 2 right upper buttock  (Active)  07/10/23 1608  Location: Buttocks  Location Orientation: Right;Mid;Upper  Staging: Stage 2 -  Partial thickness loss of dermis presenting as a shallow open injury with a red, pink wound bed without slough.  Wound Description (Comments): Pink opened stage 2 right upper buttock  Present on Admission: Yes     Pressure Injury 07/10/23 Buttocks Right;Mid;Upper Stage 2 -  Partial thickness loss of dermis presenting as a shallow open injury with a red, pink wound bed without slough. Pink stage 2 directly beside the right upper stage 2 (Active)  07/10/23 1609  Location: Buttocks  Location Orientation: Right;Mid;Upper  Staging: Stage 2 -  Partial thickness loss of dermis presenting as a shallow open injury with a red, pink wound bed without slough.  Wound Description (Comments): Pink stage 2 directly beside the right upper stage 2  Present on Admission: Yes     Pressure Injury 07/10/23 Buttocks Right;Mid;Lower Stage 2 -  Partial thickness loss of dermis presenting as a shallow open injury with a red, pink wound bed without slough. Pink stage 2 to right buttock below the upper stage 2 (Active)  07/10/23 1610  Location: Buttocks  Location Orientation: Right;Mid;Lower  Staging: Stage 2 -  Partial thickness loss of dermis presenting as a shallow open injury with a red, pink wound bed without slough.  Wound Description (Comments): Pink stage 2 to right buttock below the upper stage 2  Present on Admission: Yes     Pressure Injury 07/10/23 Buttocks Left;Mid;Lower Stage 2 -  Partial thickness loss of dermis presenting as a shallow open injury with a red, pink wound bed without slough. Pink stage 2 to left buttock (Active)  07/10/23 1611  Location: Buttocks  Location Orientation: Left;Mid;Lower  Staging: Stage 2 -  Partial thickness loss of dermis presenting as a shallow open injury with a red, pink wound bed without slough.  Wound Description (Comments): Pink stage 2 to left buttock  Present  on Admission: Yes      DVT prophylaxis: Place TED hose Start: 07/17/23 4098   Disposition: currently at CIR   Code Status:     Code Status: Full Code  Family Communication: Spoke with the patient's wife at bedside  Consultants: Urology  Procedures: PRBC transfusion Cystoscopy and double-J ureteric stent placement on the right on 08/05/2023  Anti-infectives:  Keflex orally  Anti-infectives (From admission, onward)    Start     Dose/Rate Route Frequency Ordered Stop   08/05/23 1315  cefTRIAXone (ROCEPHIN) 2 g in sodium chloride 0.9 % 100 mL IVPB        2 g 200 mL/hr over 30 Minutes Intravenous  Once 08/05/23 1308 08/05/23 1620   08/05/23 0800  cephALEXin (KEFLEX) capsule 250 mg        250 mg Oral Daily 08/04/23 1916     08/05/23 0000  cephALEXin (KEFLEX) 250 MG capsule        250 mg Oral Daily 08/05/23 0657     07/24/23 0600  cefTRIAXone (ROCEPHIN) 2 g in sodium chloride 0.9 % 100 mL IVPB        2 g 200 mL/hr over 30 Minutes Intravenous 30 min pre-op 07/23/23 1600 07/24/23 0701   07/20/23 0800  cephALEXin (KEFLEX) capsule 250 mg        250 mg  Oral 2 times daily 07/17/23 0938 07/25/23 0759       Subjective: Today, patient was seen and examined at bedside.  Patient's wife at bedside states that patient has been hurting on his right knee and inquiring about cortisone injection.  Has been having lower extremity edema.  Wife frustrated about the hematuria and catheter.  Objective: Vitals:   08/06/23 0045 08/06/23 0619  BP: (!) 106/54 109/70  Pulse: 67 (!) 59  Resp: 16 17  Temp: 97.6 F (36.4 C) 97.7 F (36.5 C)  SpO2: 98% 98%    Intake/Output Summary (Last 24 hours) at 08/06/2023 1152 Last data filed at 08/06/2023 0700 Gross per 24 hour  Intake 1340 ml  Output 550 ml  Net 790 ml   Filed Weights   08/05/23 0500 08/05/23 1154 08/06/23 0500  Weight: 105.3 kg 105.3 kg 106.6 kg   Body mass index is 34.71 kg/m.   Physical Exam:  GENERAL: Patient is alert awake  and oriented. Not in obvious distress.  Obese. HENT: No scleral pallor or icterus. Pupils equally reactive to light. Oral mucosa is moist NECK: is supple, no gross swelling noted. CHEST:  Diminished breath sounds bilaterally. CVS: S1 and S2 heard, no murmur.  Irregular rhythm. ABDOMEN: Soft, non-tender, bowel sounds are present.  Foley catheter in place with hematuria. EXTREMITIES: Bilateral lower extremity pitting edema with tenderness over the joint line of the right. CNS: Cranial nerves are intact. No focal motor deficits. SKIN: warm and dry, pressure injury bilateral buttocks.  Data Review: I have personally reviewed the following laboratory data and studies,  CBC: Recent Labs  Lab 08/03/23 0521 08/04/23 1939 08/05/23 0527 08/06/23 0607  WBC 19.0* 19.1* 24.4* 23.8*  HGB 7.6* 7.7* 7.3* 8.2*  HCT 25.1* 25.2* 23.8* 26.3*  MCV 87.2 86.6 87.5 85.1  PLT 368 350 372 341   Basic Metabolic Panel: Recent Labs  Lab 08/03/23 0521 08/04/23 0531 08/05/23 0916 08/06/23 0607  NA 132* 133* 132* 130*  K 4.0 4.2 4.3 4.9  CL 109 111 110 107  CO2 12* 12* 11* 10*  GLUCOSE 127* 98 87 159*  BUN 74* 76* 81* 84*  CREATININE 4.22* 4.45* 4.69* 4.89*  CALCIUM 8.1* 8.2* 8.1* 8.0*  PHOS  --   --  7.4*  --    Liver Function Tests: Recent Labs  Lab 08/03/23 0521 08/05/23 0916  AST 19  --   ALT 27  --   ALKPHOS 145*  --   BILITOT 0.2  --   PROT 5.5*  --   ALBUMIN 2.1* 2.0*   No results for input(s): "LIPASE", "AMYLASE" in the last 168 hours. No results for input(s): "AMMONIA" in the last 168 hours. Cardiac Enzymes: No results for input(s): "CKTOTAL", "CKMB", "CKMBINDEX", "TROPONINI" in the last 168 hours. BNP (last 3 results) No results for input(s): "BNP" in the last 8760 hours.  ProBNP (last 3 results) No results for input(s): "PROBNP" in the last 8760 hours.  CBG: Recent Labs  Lab 08/05/23 1514 08/05/23 1646 08/05/23 1725 08/05/23 2137 08/06/23 0616  GLUCAP 70 80 92 148*  164*   Recent Results (from the past 240 hours)  Body fluid culture w Gram Stain     Status: None   Collection Time: 07/31/23  2:20 PM   Specimen: Body Fluid  Result Value Ref Range Status   Specimen Description FLUID  Final   Special Requests LEFT KNEE  Final   Gram Stain   Final    ABUNDANT WBC PRESENT,  PREDOMINANTLY PMN NO ORGANISMS SEEN    Culture   Final    NO GROWTH 3 DAYS Performed at [facility], [address] [place], [place] [address]    Report Status 08/03/2023 FINAL  Final  Urine Culture (for pregnant, neutropenic or urologic patients or patients with an indwelling urinary catheter)     Status: Abnormal   Collection Time: 08/04/23  2:08 PM   Specimen: Urine, Catheterized  Result Value Ref Range Status   Specimen Description URINE, CATHETERIZED  Final   Special Requests   Final    NONE Performed at [facility], [address], [place], [place] [address]    Culture >=100,000 COLONIES/mL YEAST (A)  Final   Report Status 08/05/2023 FINAL  Final     Studies: DG C-Arm 1-60 Min Result Date: 08/05/2023 CLINICAL DATA:  Cystoscopy and retrograde pyelogram EXAM: DG C-ARM 1-60 MIN COMPARISON:  08/05/2023 FINDINGS: Eight fluoroscopic images are obtained during the performance of the procedure and are provided for interpretation only. The right ureter is cannulated and opacified, with placement of a right ureteral stent. Proximal aspect coiled within the right renal pelvis and distal aspect coiled within the bladder. Please refer to the operative report. Fluoroscopy time: 1 minute 59 seconds, 52.14 mGy IMPRESSION: 1. Intraoperative exam as above. Please refer to the operative report. Electronically Signed   By: Sharlet Salina M.D.   On: 08/05/2023 20:28   CT ABDOMEN PELVIS WO CONTRAST Result Date: 08/05/2023 CLINICAL DATA:  73 year old male with gross hematuria. No nephrolithiasis. Left nephrostomy in February. EXAM: CT ABDOMEN AND PELVIS WITHOUT CONTRAST TECHNIQUE:  Multidetector CT imaging of the abdomen and pelvis was performed following the standard protocol without IV contrast. RADIATION DOSE REDUCTION: This exam was performed according to the departmental dose-optimization program which includes automated exposure control, adjustment of the mA and/or kV according to patient size and/or use of iterative reconstruction technique. COMPARISON:  Noncontrast CT Abdomen and Pelvis 06/26/2023. FINDINGS: Lower chest: Stable cardiomegaly. No pericardial effusion. But small volume simple fluid density right pleural effusion is new since February. Contralateral left lung base fibrothorax and pleural thickening appears unchanged. Hepatobiliary: Cholelithiasis. Trace new perihepatic free fluid with simple fluid density. Mildly nodular liver contour again noted. No discrete liver lesion in the absence of contrast. Pancreas: Atrophied. Spleen: Stable and within normal limits. Adrenals/Urinary Tract: Adrenal glands remain within normal limits. Left side percutaneous nephrostomy seen in February has been removed but a left double-J ureteral stent is in place with appropriate positioning. Superimposed developing staghorn calculus in the left midpole renal calices is noted and there is moderate increased left hydronephrosis with hyperdense fluid within the dilated collecting system (series 6, image 41). Chronic but increased perinephric soft tissue stranding. No calculus identified along the course of the double-J stent. Contralateral new moderate to severe right hydronephrosis and severe right hydroureter (up to 2 cm diameter). And there is hyperdense fluid, layering hematocrit also within the dilated right renal collecting system and pelvis (series 3, image 39). Increased right pararenal space inflammation and stranding. The right ureter remains dilated to the level of the pelvic inlet where a large obstructing 7-8 mm diameter calculus is noted on series 3, image 70 and coronal image 53.  Distal to that the right ureter is decompressed to the bladder. Foley catheter and distal double-J stent are present within the bladder. Small volume of gas within the bladder. Bladder wall is mildly thickened and indistinct. Stomach/Bowel: Increased large bowel retained stool since  February, mild-to-moderate. No dilated large or small bowel loops. Evidence that the appendix remains diminutive and normal on series 3, image 59. Small volume retained fluid in the stomach. No pneumoperitoneum. Bilateral retroperitoneal, pericolic gutter inflammatory stranding which probably is emanating from the renal spaces. This is confluent in the left lower quadrant series 3, image 72, but adjacent bowel seems unaffected. Vascular/Lymphatic: Aortoiliac calcified atherosclerosis. Normal caliber abdominal aorta. Vascular patency is not evaluated in the absence of IV contrast. No lymphadenopathy identified. Reproductive: Urethral catheter in place. Other: Mild to moderate presacral edema. Musculoskeletal: Moderate to severe new bilateral body wall, flank subcutaneous edema. Widespread lower thoracic through upper lumbar spinal ankylosis from bridging endplate osteophytes. No acute osseous abnormality identified. IMPRESSION: 1. Bilateral Acute Obstructive Uropathy with evidence of bilateral collecting system Hematuria. Large 7-8 mm diameter obstructing calculus of the right ureter at the pelvic inlet. New left hydronephrosis despite normally positioned left double-J ureteral stent. Left nephrostomy tube has been removed since February. Underlying left renal staghorn calculus. Associated bilateral pararenal space inflammation tracking in the retroperitoneum to the pelvis. 2. Foley catheter and distal double-J stent within the Urinary Bladder which appears mildly inflamed. 3. Superimposed new Anasarca: Body wall edema, trace perihepatic ascites (see #4), trace layering right pleural effusion. 4. Nodular liver contour raising the  possibility of Cirrhosis. Chronic cholelithiasis. 5. Cardiomegaly. Left lung base fibrothorax. Aortic Atherosclerosis (ICD10-I70.0). Electronically Signed   By: Odessa Fleming M.D.   On: 08/05/2023 06:36      Joycelyn Das, MD  [facility] 08/06/2023  If 7PM-7AM, please contact night-coverage

## 2023-08-06 NOTE — Progress Notes (Signed)
 Physical Therapy Session Note  Patient Details  Name: Calvin Schultz MRN: [id number] Date of Birth: Sep 19, 1950  Today's Date: 08/06/2023 PT Individual Time: 1121-1202 PT Individual Time Calculation (min): 41 min   Short Term Goals: Week 4:  PT Short Term Goal 1 (Week 4): STG=LTG 2/2 ELOS  Skilled Therapeutic Interventions/Progress Updates:     Verified with MD via secure chat okay to continue therapy, written orders also reflect this.    Pt supine bed upon arrival. Pt agreeable to therapy. Pt reports fatigue but reports no more/less than typical fatigue.   Pt performed rolling B with use of bed rails and total A, with +2 for assist for donning pants. Pt performed supine to sit with use of bed features and total A for trunk and LE. Pt performed static seated balance with min-mod A for correction of L Lateral trunk lean.   Pt requesting to get OOB in power WC. Therapist trailed slide board transfer with +2, however discontinued for safety.   Therapist opted not to get OOB during session 2/2 pt fatigue and time constraints.   Therapist managing IV and foley throughout session.   Pt in bed in chair position at end of session with all needs within reach and bed alarm on. Nurse aware of pt position.   Therapy Documentation Precautions:  Precautions Precautions: Fall Precaution/Restrictions Comments: Foley, bilateral knee pain and decreased ROM Restrictions Weight Bearing Restrictions Per Provider Order: No  Therapy/Group: Individual Therapy  [facility] [place], [place], DPT  08/06/2023, 11:30 AM

## 2023-08-06 NOTE — Progress Notes (Addendum)
 Patient ID: Calvin Schultz, male   DOB: Oct 05, 1950, 73 y.o.   MRN: [id number]  Met with pt and wife who has several concerns. Feels the first 2.5 weeks unable to fully participate due to given too much oxy. Was doing well until the latest issue with bleeding and the cath caused it. Pt was making good progress until the latest occurred and she feels we need to re-group and come up with a plan. One of her main concerns is getting a ortho consult to look at his knees. One was injected but what is going on and what about the other one. She would like a meeting with MD and SW may need to include the whole team. Have messaged MD and team regarding this  12:09 PM Have scheduled family conference for Tuesday at 1:00 with team and MD. Wife and pt agreeable and were offered to meet tomorrow with MD and Sw and would rather have the whole team there too. Therapy team is re-evaluating him today after surgery and lack of therapy due to medical issues this week

## 2023-08-07 DIAGNOSIS — A0472 Enterocolitis due to Clostridium difficile, not specified as recurrent: Secondary | ICD-10-CM

## 2023-08-07 DIAGNOSIS — N3001 Acute cystitis with hematuria: Secondary | ICD-10-CM

## 2023-08-07 LAB — BASIC METABOLIC PANEL WITH GFR
Anion gap: 9 (ref 5–15)
BUN: 85 mg/dL — ABNORMAL HIGH (ref 8–23)
CO2: 13 mmol/L — ABNORMAL LOW (ref 22–32)
Calcium: 7.9 mg/dL — ABNORMAL LOW (ref 8.9–10.3)
Chloride: 110 mmol/L (ref 98–111)
Creatinine, Ser: 5.14 mg/dL — ABNORMAL HIGH (ref 0.61–1.24)
GFR, Estimated: 11 mL/min — ABNORMAL LOW (ref >60)
Glucose, Bld: 137 mg/dL — ABNORMAL HIGH (ref 70–99)
Potassium: 4.6 mmol/L (ref 3.5–5.1)
Sodium: 132 mmol/L — ABNORMAL LOW (ref 135–145)

## 2023-08-07 LAB — MAGNESIUM: Magnesium: 2.1 mg/dL (ref 1.7–2.4)

## 2023-08-07 LAB — CBC
HCT: 27.4 % — ABNORMAL LOW (ref 39.0–52.0)
Hemoglobin: 8.6 g/dL — ABNORMAL LOW (ref 13.0–17.0)
MCH: 26.5 pg (ref 26.0–34.0)
MCHC: 31.4 g/dL (ref 30.0–36.0)
MCV: 84.6 fL (ref 80.0–100.0)
Platelets: 361 10*3/uL (ref 150–400)
RBC: 3.24 MIL/uL — ABNORMAL LOW (ref 4.22–5.81)
RDW: 16.6 % — ABNORMAL HIGH (ref 11.5–15.5)
WBC: 35.3 10*3/uL — ABNORMAL HIGH (ref 4.0–10.5)
nRBC: 0.1 % (ref 0.0–0.2)

## 2023-08-07 LAB — GLUCOSE, CAPILLARY: Glucose-Capillary: 143 mg/dL — ABNORMAL HIGH (ref 70–99)

## 2023-08-07 MED ORDER — SODIUM BICARBONATE 4.2 % IV SOLN
25.0000 meq | Freq: Once | INTRAVENOUS | Status: DC
  Filled 2023-08-07: qty 50

## 2023-08-07 MED ORDER — FUROSEMIDE 10 MG/ML IJ SOLN
60.0000 mg | Freq: Once | INTRAMUSCULAR | Status: AC
  Administered 2023-08-07: 60 mg via INTRAVENOUS
  Filled 2023-08-07: qty 6

## 2023-08-07 MED ORDER — VANCOMYCIN 50 MG/ML ORAL SOLUTION
125.0000 mg | Freq: Four times a day (QID) | ORAL | Status: DC
  Administered 2023-08-07: 125 mg
  Filled 2023-08-07 (×2): qty 2.5

## 2023-08-07 MED ORDER — SODIUM BICARBONATE 8.4 % IV SOLN
25.0000 meq | Freq: Once | INTRAVENOUS | Status: AC
  Administered 2023-08-07: 25 meq via INTRAVENOUS
  Filled 2023-08-07: qty 50

## 2023-08-07 MED ORDER — LOPERAMIDE HCL 2 MG PO CAPS
2.0000 mg | ORAL_CAPSULE | Freq: Three times a day (TID) | ORAL | Status: DC | PRN
  Administered 2023-08-07 – 2023-08-08 (×2): 2 mg via ORAL
  Filled 2023-08-07 (×2): qty 1

## 2023-08-07 MED ORDER — SODIUM CHLORIDE 0.9 % IV SOLN
2.0000 g | INTRAVENOUS | Status: AC
  Administered 2023-08-07 – 2023-08-16 (×10): 2 g via INTRAVENOUS
  Filled 2023-08-07 (×10): qty 12.5

## 2023-08-07 MED ORDER — VANCOMYCIN HCL 125 MG PO CAPS
125.0000 mg | ORAL_CAPSULE | Freq: Four times a day (QID) | ORAL | Status: DC
  Administered 2023-08-07 – 2023-08-09 (×7): 125 mg via ORAL
  Filled 2023-08-07 (×11): qty 1

## 2023-08-07 NOTE — Progress Notes (Signed)
 PROGRESS NOTE   Subjective/Complaints: Patient had multiple loose bowel movements overnight, had difficulty sleeping because of frequent bowel movements.  Feels little tired today due to this issue.  Continues to have some dark urine in his Foley.  Right knee pain is improving.   ROS: Patient denies fever, new vision changes, dizziness, nausea, vomiting shortness of breath or chest pain, headache, or mood change. Diarrhea-worsened again last night Knee pain-left improved, right also improving Hematuria continued + fatigue     Objective:   No results found.     Recent Labs    08/06/23 0607 08/07/23 0835  WBC 23.8* 35.3*  HGB 8.2* 8.6*  HCT 26.3* 27.4*  PLT 341 361     Recent Labs    08/06/23 0607 08/07/23 0835  NA 130* 132*  K 4.9 4.6  CL 107 110  CO2 10* 13*  GLUCOSE 159* 137*  BUN 84* 85*  CREATININE 4.89* 5.14*  CALCIUM 8.0* 7.9*      Intake/Output Summary (Last 24 hours) at 08/07/2023 1633 Last data filed at 08/07/2023 1516 Gross per 24 hour  Intake --  Output 1676 ml  Net -1676 ml     Pressure Injury 07/10/23 Buttocks Right;Mid;Upper Stage 2 -  Partial thickness loss of dermis presenting as a shallow open injury with a red, pink wound bed without slough. Pink opened stage 2 right upper buttock (Active)  07/10/23 1608  Location: Buttocks  Location Orientation: Right;Mid;Upper  Staging: Stage 2 -  Partial thickness loss of dermis presenting as a shallow open injury with a red, pink wound bed without slough.  Wound Description (Comments): Pink opened stage 2 right upper buttock  Present on Admission: Yes     Pressure Injury 07/10/23 Buttocks Right;Mid;Upper Stage 2 -  Partial thickness loss of dermis presenting as a shallow open injury with a red, pink wound bed without slough. Pink stage 2 directly beside the right upper stage 2 (Active)  07/10/23 1609  Location: Buttocks  Location Orientation:  Right;Mid;Upper  Staging: Stage 2 -  Partial thickness loss of dermis presenting as a shallow open injury with a red, pink wound bed without slough.  Wound Description (Comments): Pink stage 2 directly beside the right upper stage 2  Present on Admission: Yes     Pressure Injury 07/10/23 Buttocks Right;Mid;Lower Stage 2 -  Partial thickness loss of dermis presenting as a shallow open injury with a red, pink wound bed without slough. Pink stage 2 to right buttock below the upper stage 2 (Active)  07/10/23 1610  Location: Buttocks  Location Orientation: Right;Mid;Lower  Staging: Stage 2 -  Partial thickness loss of dermis presenting as a shallow open injury with a red, pink wound bed without slough.  Wound Description (Comments): Pink stage 2 to right buttock below the upper stage 2  Present on Admission: Yes     Pressure Injury 07/10/23 Buttocks Left;Mid;Lower Stage 2 -  Partial thickness loss of dermis presenting as a shallow open injury with a red, pink wound bed without slough. Pink stage 2 to left buttock (Active)  07/10/23 1611  Location: Buttocks  Location Orientation: Left;Mid;Lower  Staging: Stage 2 -  Partial thickness loss  of dermis presenting as a shallow open injury with a red, pink wound bed without slough.  Wound Description (Comments): Pink stage 2 to left buttock  Present on Admission: Yes    Physical Exam: Vital Signs Blood pressure 125/64, pulse 65, temperature 97.9 F (36.6 C), temperature source Oral, resp. rate 17, height 5\' 9"  (1.753 m), weight 105.2 kg, SpO2 99%.  Constitutional: No distress . Vital signs reviewed.  Sitting in bed. Obese, Appears comfortable HEENT: NCAT, EOMI, oral membranes moist Neck: supple Cardiovascular: RRR Respiratory/Chest: CTA Bilaterally without wheezes or rales. Normal effort    GI/Abdomen: BS +, non-tender, non-distended Ext: no clubbing, cyanosis, + 3 + B/L LE and 2-3+ LUE edema and 2+ RUE edema   GU:  Bloody urine in Foley  bag  Psych: pleasant and cooperative Skin: No evidence of breakdown, no evidence of rash Neurologic: Cranial nerves II through XII grossly intact, motor strength is moving all 4 extremities to gravity and resistance    MSK: Bilateral knee effusions ongoing Left shoulder tight in ER/IR/F/E            Assessment/Plan: 1. Functional deficits which require 3+ hours per day of interdisciplinary therapy in a comprehensive inpatient rehab setting. Physiatrist is providing close team supervision and 24 hour management of active medical problems listed below. Physiatrist and rehab team continue to assess barriers to discharge/monitor patient progress toward functional and medical goals  Care Tool:  Bathing  Bathing activity did not occur:  (patient completed a simulated task at bed LOF) Body parts bathed by patient: Right arm, Left arm, Chest, Abdomen, Front perineal area, Right upper leg, Left upper leg, Face   Body parts bathed by helper: Buttocks, Left lower leg, Right lower leg     Bathing assist Assist Level: Maximal Assistance - Patient 24 - 49%     Upper Body Dressing/Undressing Upper body dressing   What is the patient wearing?: Pull over shirt    Upper body assist Assist Level: Contact Guard/Touching assist    Lower Body Dressing/Undressing Lower body dressing      What is the patient wearing?: Pants, Underwear/pull up, Incontinence brief     Lower body assist Assist for lower body dressing: Maximal Assistance - Patient 25 - 49%     Toileting Toileting    Toileting assist Assist for toileting: 2 Helpers     Transfers Chair/bed transfer  Transfers assist  Chair/bed transfer activity did not occur: Safety/medical concerns (not safe to get up)  Chair/bed transfer assist level: Maximal Assistance - Patient 25 - 49%     Locomotion Ambulation   Ambulation assist   Ambulation activity did not occur: Safety/medical concerns  Assist level: Dependent -  Patient 0% Assistive device: Other (comment) (sara plus) Max distance: 18 steps   Walk 10 feet activity   Assist  Walk 10 feet activity did not occur: Safety/medical concerns        Walk 50 feet activity   Assist Walk 50 feet with 2 turns activity did not occur: Safety/medical concerns         Walk 150 feet activity   Assist Walk 150 feet activity did not occur: Safety/medical concerns         Walk 10 feet on uneven surface  activity   Assist Walk 10 feet on uneven surfaces activity did not occur: Safety/medical concerns         Wheelchair     Assist Is the patient using a wheelchair?: Yes Type of Wheelchair: Power  Wheelchair assist level: Supervision/Verbal cueing Max wheelchair distance: 300+    Wheelchair 50 feet with 2 turns activity    Assist        Assist Level: Supervision/Verbal cueing   Wheelchair 150 feet activity     Assist      Assist Level: Supervision/Verbal cueing   Blood pressure 125/64, pulse 65, temperature 97.9 F (36.6 C), temperature source Oral, resp. rate 17, height 5\' 9"  (1.753 m), weight 105.2 kg, SpO2 99%.   Medical Problem List and Plan: 1. Functional deficits secondary to debility related to septic shock/C. difficile colitis/AKI superimposed on CKD stage IV             -patient may shower with tubes/drains covered             -ELOS/Goals:  3/28, min assist PT/OT             -Continue CIR therapies including PT, OT   -Team meeting scheduled for Tuesday  -Discussed with plan with patient and wife on rounds and then called wife later this morning for continued discussion.  Will plan team meeting for Tuesday at 1 PM.  -Daily therapy for now   2.  Antithrombotics: -DVT/anticoagulation:  Pharmaceutical: Eliquis             -antiplatelet therapy: N/A   3. Pain Management: tramadol prn, Zanaflex 4 mg twice daily as needed   Biliateral OA of knees  -ordered bilateral neoprine knee sleeves --try  today  -voltaren gel tid to knees  -have scheduled tramadol 50mg  at 0700 and 1200 daily. Continue prn as well  Mild adhesive capsulitis left shoulder---  sports cream. Can use voltaren gel also. Aggressive ROM with therapy and while in bed--continue  -Will check right knee x-ray, left knee x-ray on 3/8 with advanced OA.  Will consult orthopedics for cortisone injections  -3-28: Orthopedics aspirated and injected left knee, with improvement.  Ongoing pain with right knee.  3/31 Knee fluid calcium pyrophosphate cyrystals, pseudogout?, cultures with no growth  -4/2 hydrocodone PRN started yesterday for flank pain  4/3 reconsult orthopedics, will come up and talk to him today about his right knee.  I had spoken with orthopedics on 4/1 are considering oral steroids due to crystals, pseudogout?  But held off due to more acute medical issues  4/4 right knee is feeling better continue to monitor    4. Mood/Behavior/Sleep: Trazodone 50 mg nightly as needed.  Provide emotional support             -antipsychotic agents: N/A   5. Neuropsych/cognition: This patient is capable of making decisions on his own behalf.   6. Skin/Wound Care: Routine skin checks   7. Fluids/Electrolytes/Nutrition:    -3/14 po intake remains poor, albumin low, weights falling  -will ask RD for assistance. Have spoken to patient about intake  -?IV albumin (see below)  3/18 discussed with dietitian, patient recorded is eating 0% of his meals.  Appears that wife bringing in 3 meals a day and he is eating about 75%, appears to have fairly adequate intake.  3/27 eating frequent outside foods-doesn't like hospital food as much  8.  Oliguric AKI superimposed on CKD stage IV.  Baseline creatinine 4.9.  CRRT discontinued 2/25.  No current plan for long-term dialysis.  Follow-up renal services              - Per discussion with nephrology Dr. Verna Czech, keep Foley catheter in to allow accurate I's and O's through Monday;  then, can remove  and do DC Foley trial while continuing to document strict outputs             - Continue daily assessments of renal function per nephrology   3/8- per renal, Cr leveling out- and con't Foley- will remove Monday  -nephrology following, Cr remains in 4's.   3/14 renal function with some improvement today     -suspect lower extremity edema is related to renal function/low albumin   Appreciate nephro input   -3/17 BUN/creatinine slightly improved 34/3.31, appreciate nephrology assistance  3/19 BUN/CR stable at 34/3.24, nephrology signing off, ok to check BMET twice a week  3/23 encouraging po this weekend. happy that he's eating food from home  3/24 BUN/CR overall stable at 40/3.26  -3/31 BUN/Cr a little higher today, IVF 85ml/hr  -4/1 Cr a little higher again, will contact nephrology  -4/3 nephrology note reviewed, IV Lasix discontinued and he was given some Lasix today, changed oral bicarb by nephrology, can give IV as needed  4/4 patient has a lot of edema, nephrology reducing Lasix today and holding IVF creatinine higher at 5.14  9.  C. difficile colitis.  Completed course of oral vancomycin 3/7.  DC'd IV Flagyl 3/3. Enteric precautions   3/14 stools formed this morning   3/17 discussed with ID pharmacy, patient having more frequent bowel movements.  Monitor today and if continues consider Dificid treatment  3/18 diarrhea appears to be improving, continue to monitor for now  3/19 Frequent Bms improved, continue to follow  3/21 having more frequent bowel movements again today, continue to monitor and if frequent diarrhea persists this weekend consider treatment with Dificid  3/23- mushy/lqiuid stool again overnight    -no dificid at this point. Discuss with GI first on Monday    -added fiber to regimen yesterday, will increase to bid today  3/24 mushy/ liquid BMs, about 2 a day yesterday, more other days- will discuss GI  3/25 GI recommended repeat C. difficile screening. Discussed with  Jerolyn [facility], hold off on repeat screening test, he only had about 2 bowel movements a day but if continues to have frequent BMs consider retreatment with oral vancomycin for 10 days  3/26 pt reports Bms more firm, continue to monitor   3/28 patient reports BMs have been more solid, continue to monitor.  If worsening diarrhea occurs consider retreatment with vancomycin oral for 10 days  3-29: Diarrhea this a.m., no bowel movements this afternoon recorded  3-30: No BM since yesterday.  3-31 diarrhea continues to be improved overall  4-3 LBM yesterday, reports diarrhea continues to be improved  -4/4 patient with increased diarrhea, discussed with hospitalist and ID.  Restarted oral vancomycin 125 mg 4 times a day for 10 days  10.  Status post left nephrostomy tube d/t staghorn calculi. Has Foley Nephrostomy tube placed 1/29 per IR with plan for ureteric stent placement 3/21              - Careful monitoring of nephrostomy output  -3/14 flomax has been added to improve emptying  -3/21 patient scheduled today for cystoscopy with left retrograde pyelogram, left ureteroscopy laser lithotripsy and left ureteral stent placement.     3/22- procedure appears successful thus far.    -urine clear, patient comfortable   -foley to come out on Monday per urology  3/25 Foley was removed patient continues to have urinary retention requiring IC.  Increase Flomax to 0.8 mg  3/26 intermittent blood noted with IC,  if continues will restart foley  3/27 Urine no longer blood tinged, continue current regimen, consider restart foley if reoccurs  3/27 continues to require catheterization but no longer blood-tinged Addendum 3/28 foley started after traumatic cath, possible false passage, continue foley for now, f/u with urology outpatient 4/1 foley with hematuria, denies known trauma to this, will ask nursing to check bladder scan. U/A culture ordered.  Called his urologist to discuss 4/2 CT abdomen/pelvis-bilateral  obstructive uropathy with collecting system hematuria, large calculus in right ureter and pelvic inlet, new left hydronephrosis, new anasarca, liver nodules raising possibility of cirrhosis.  Discussed patient with urology - surgical procedure today  Addendum discussed with urology, will hold eliquis for now due to bleeding, check EKG- A flutter -4/3 cystoscopy, right retrograde pyelogram, right ureteral stent insertion yesterday by Dr. Pete Glatter, further procedure scheduled on 4/15 with Dr. Mena Goes.  Discussed with [facility] urology.  Do not think traumatic cath last week caused recurrent bleeding his kidney, his wife was concerned about this.  Patient was having nonbloody urine for several days on the Foley.  Will continue Foley and not attempt intermittent cath.  Patient suspected to have false passage. 4/4 urine appears less dark today, urology considering TXA tomorrow if bleeding is not resolved, Eliquis on hold   11.  Chronic atrial fibrillation with episodes of bradycardia.  Followed by cardiology services.  Continue Eliquis.  Avoid AV nodal blocking agents   12.  Diabetes mellitus.  Hemoglobin A1c 6.2.    -tightly controlled. Dc SSI and change cbg checks to qam only   CBG (last 3)  Recent Labs    08/05/23 2137 08/06/23 0616 08/07/23 0444  GLUCAP 148* 164* 143*       3-30: Recently increased.  Somewhat expected with intra-articular steroid injection.  Continue to trend.  4/3 controlled continue current regimen  13.  Hypotension.  ProAmatine 10 mg 3 times daily.  Monitor with increased mobility   - Normotensive on admission  3/9- BP running 100-s to 120s systolic- con't regimen  3/19 BP has been on high side, will change midodrine to 5mg  TID PRN orthostatic hypotension  3/28 BP stable, continue current regimen  -4/3-4 BP continue current regimen and monitor     08/07/2023    2:30 PM 08/07/2023    4:44 AM 08/06/2023    8:17 PM  Vitals with BMI  Weight  231 lbs 15 oz    BMI  34.23   Systolic 125 135 130  Diastolic 64 68 58  Pulse 65 63 62    14.  Chronic anemia.  Niferex daily/Aranesp.  Monitor for any bleeding episodes   3/8- Hb 7.1 today- will order transfusion- transfusion of 1 unit pRBCs.   3/14 hgb up to 9.1  -3/17 hemoglobin down to 8.4, continue to monitor  -3/19 HGB 8.3 today, stable overall, continue to monitor    -3/27 HGB 7.7- a little lower likely due to GU procedure/hematuria, continue to monitor  3/31 HGB 7.6 today, transfuse 7 or less  4/3 hemoglobin stable today after transfusion yesterday  16. Leukocytosis  3/9- Pt's WBC is 21.7- up from 16k- is afebrile- due to his recent C diff, that just finished correct dosing of ABX for, called ID for guidance on treatment- don't want to cause more Cdiff- ID consulted ---no new orders  3/10 WBC's down to 17k  --have been hovering above and below 20 for awhile. No new clinical symptoms. Nephrostomy drainage clear, yellow  3/14 wbc's down to  13k.--recheck Monday   3/17 WBC is down to 12.4  3/19 WBC 13.4 today, continue to monitor  3/24 WBC higher today 18.7, Discuss C diff treatment with GI a above 3/31 WBC still elevated 19.0, monitor for signs of infection 4/3 WBC still elevated to 23.8, potentially due to his urological issues and procedure.  Hospitalist also following appreciate assistance  4/4 WBC higher today, oral vancomycin started as above.  Discussed with ID Patrecia Pace and hospitalist.  Patient had Pseudomonas resulting in prior urine culture.  Recommended cefepime for approximately 10 days depending on WBC.  Called pharmacy for cefepime dosing.  ID recommended replacing Foley due to yeast on last culture, discussed with urology who indicates this was already done so hold off on changing again.  17. Decreased mood reported by therapy  -Consider SSRI  -3/20 patient reports his mood is okay, denies depression this morning.  Continue to monitor  18. Insomnia   -3/20 schedule  trazodone and increase to 75mg  at bedtime  -3/21 insomnia improved but bowel movements at night did wake him up a few times  3/23 sleeping better when stooling isn't an issue  19. LE edema  -Check albumin/cmp with next labs. Leg elevation and compression   -Significantly improved on the left after steroid injection and effusion tap.  -IVF discontinued as above  - Nephrology following, treating with Lasix  LOS: 28 days A FACE TO FACE EVALUATION WAS PERFORMED  Fanny Dance 08/07/2023, 4:33 PM

## 2023-08-07 NOTE — Progress Notes (Signed)
 Patient ID: Calvin Schultz, male   DOB: [birth year], 73 y.o.   MRN: [id number]  Gave wife co-pay information for [employer] and the hospital bed. Pt reports feeling tired but better. Wife is here to observe in therapies. Pt's other knee was injected yesterday. Hopefully will help his pain and mobility.

## 2023-08-07 NOTE — Progress Notes (Signed)
 [facility] Progress Note   Subjective:  Back pain remains resolved.  UOP with IV lasix 40 IV (was day prior no lasix).     No uremic symptoms.  Back to doing therapy.   Appetite ok now - eating/drinking. C/o worsening edema.   Objective Vitals:   08/06/23 0619 08/06/23 1504 08/06/23 2017 08/07/23 0444  BP: 109/70 112/70 (!) 105/58 135/68  Pulse: (!) 59 61 62 63  Resp: 17 18 18 18   Temp: 97.7 F (36.5 C) 97.6 F (36.4 C) 97.9 F (36.6 C) 97.8 F (36.6 C)  TempSrc: Oral  Oral   SpO2: 98% 100% 100% 99%  Weight:    105.2 kg  Height:       Physical Exam General: lying flat, comfortable lying still Heart: RRR Lungs: clear Abdomen: soft Extremities: 3+ L tibial edema 3+ R tibial, LUE 3+ RUE 2+ GU:  foley draining bloody urine, no clots  Additional Objective Labs: Basic Metabolic Panel: Recent Labs  Lab 08/05/23 0916 08/06/23 0607 08/07/23 0835  NA 132* 130* 132*  K 4.3 4.9 4.6  CL 110 107 110  CO2 11* 10* 13*  GLUCOSE 87 159* 137*  BUN 81* 84* 85*  CREATININE 4.69* 4.89* 5.14*  CALCIUM 8.1* 8.0* 7.9*  PHOS 7.4*  --   --    Liver Function Tests: Recent Labs  Lab 08/03/23 0521 08/05/23 0916  AST 19  --   ALT 27  --   ALKPHOS 145*  --   BILITOT 0.2  --   PROT 5.5*  --   ALBUMIN 2.1* 2.0*   No results for input(s): "LIPASE", "AMYLASE" in the last 168 hours. CBC: Recent Labs  Lab 08/03/23 0521 08/04/23 1939 08/05/23 0527 08/06/23 0607 08/07/23 0835  WBC 19.0* 19.1* 24.4* 23.8* 35.3*  HGB 7.6* 7.7* 7.3* 8.2* 8.6*  HCT 25.1* 25.2* 23.8* 26.3* 27.4*  MCV 87.2 86.6 87.5 85.1 84.6  PLT 368 350 372 341 361   Blood Culture    Component Value Date/Time   SDES URINE, CATHETERIZED 08/04/2023 1408   SPECREQUEST  08/04/2023 1408    NONE Performed at [facility], [address], [place], [place] [address]    CULT >=100,000 COLONIES/mL YEAST (A) 08/04/2023 1408   REPTSTATUS 08/05/2023 FINAL 08/04/2023 1408    Cardiac  Enzymes: No results for input(s): "CKTOTAL", "CKMB", "CKMBINDEX", "TROPONINI" in the last 168 hours. CBG: Recent Labs  Lab 08/05/23 1646 08/05/23 1725 08/05/23 2137 08/06/23 0616 08/07/23 0444  GLUCAP 80 92 148* 164* 143*   Iron Studies: No results for input(s): "IRON", "TIBC", "TRANSFERRIN", "FERRITIN" in the last 72 hours. @lablastinr3 @ Studies/Results: DG C-Arm 1-60 Min Result Date: 08/05/2023 CLINICAL DATA:  Cystoscopy and retrograde pyelogram EXAM: DG C-ARM 1-60 MIN COMPARISON:  08/05/2023 FINDINGS: Eight fluoroscopic images are obtained during the performance of the procedure and are provided for interpretation only. The right ureter is cannulated and opacified, with placement of a right ureteral stent. Proximal aspect coiled within the right renal pelvis and distal aspect coiled within the bladder. Please refer to the operative report. Fluoroscopy time: 1 minute 59 seconds, 52.14 mGy IMPRESSION: 1. Intraoperative exam as above. Please refer to the operative report. Electronically Signed   By: Sharlet Salina M.D.   On: 08/05/2023 20:28   Medications:     cephALEXin  250 mg Oral Daily   Chlorhexidine Gluconate Cloth  6 each Topical BID   darbepoetin (ARANESP) injection - NON-DIALYSIS  40 mcg Subcutaneous Q Fri-1800  diclofenac Sodium  2 g Topical QID   iron polysaccharides  150 mg Oral Daily   multivitamin  1 tablet Oral QHS   Muscle Rub   Topical TID   psyllium  1 packet Oral BID   sodium bicarbonate  1,300 mg Oral TID   tamsulosin  0.8 mg Oral QPC supper   traMADol  50 mg Oral BID   traZODone  75 mg Oral QHS   vancomycin  125 mg Oral QID    Assessment/Plan:73yo M A fib, DM, A fib on eliquis, nephrolithiasis s/p recent PCN for staghorn calculus currently admitted to CIR for debility and nephrology is reconsulted for AKI on CKD.  **AKI on CKD 4:   Baseline kidney function 05/2023 Cr 4.1 - 5.3 f/b CKA.  After CRRT stopped his creatinine improved to nadir of 3.2 on 3/19 and  has gradually trended up 3/27 3.6 > 3/31 4.2 > 4/1 4.45 .  Found to have obstructing stone on R s/p OR with ureteral stent 4/2.  Cr trending up further today 5.14.  Remains nonoliguric with no uremic symptoms; no RRT needed at this time but discussed need may arise in coming days.  Redose lasix today and holding IVF.  Daily labs for now.  Strict I/Os.  Avoid hypotension and nephrotoxins.    **Anemia, normocytic:  Transfuse as needed for Hb < 7.  Having acute blood loss through foley but H/H stable.  Eliquis has been held.  On aranesp.   **Metabolic acidosis:  worsening in context of AKI; in light of worsening overload d/cd bicarb gtt and switched to oral. Bicarb 13 today will give 1 amp IV and cont po.  **A fib:  eliquis on hold for acute bleeding  **Orthostatic hypotension:  has been on midodrine for the last few weeks, BPs look ok currently.   **Debility:  on CIR  Will follow closely with you, please reach out with concerns.   Estill Bakes MD 08/07/2023, 10:29 AM  [facility] Pager: [phone]

## 2023-08-07 NOTE — Progress Notes (Signed)
   2 Days Post-Op Subjective: Pt resting in bed on my arrival. He was accompanied by his wife. Urine was a clear light red.   Objective: Vital signs in last 24 hours: Temp:  [97.6 F (36.4 C)-97.9 F (36.6 C)] 97.8 F (36.6 C) (04/04 0444) Pulse Rate:  [61-63] 63 (04/04 0444) Resp:  [18] 18 (04/04 0444) BP: (105-135)/(58-70) 135/68 (04/04 0444) SpO2:  [99 %-100 %] 99 % (04/04 0444) Weight:  [105.2 kg] 105.2 kg (04/04 0444)  Assessment/Plan: #left obstructing ureteral stone S/p first step of staged treatment with Dr. Mena Goes 07/24/23  #right obstructing ureteral stone The day before discharging home (4/2) an existing right side stone obstructed and pt underwent cysto/stent placement w/ Dr. Pete Glatter.   Definitive stone mgmt with Dr. Mena Goes on 4/15.   #Hematuria  CT A/P shows some presumed blood products in the right kidney. This side had not undergone any procedures at the time of the bleed. No clear cause identified. No active bleeding noted in bladder/prostate at time of cysto. False passage noted.  Ongoing hematuria, though improving today.  Blood thinners held  1g TXA tomorrow morning if bleeding has not resolved.  Intake/Output from previous day: 04/03 0701 - 04/04 0700 In: 240 [P.O.:240] Out: 975 [Urine:975]  Intake/Output this shift: Total I/O In: -  Out: 1 [Stool:1]  Physical Exam:  General: Alert and oriented CV: No cyanosis Lungs: equal chest rise Gu: foley catheter in placed draining clear dark red/brown urine  Lab Results: Recent Labs    08/05/23 0527 08/06/23 0607 08/07/23 0835  HGB 7.3* 8.2* 8.6*  HCT 23.8* 26.3* 27.4*   BMET Recent Labs    08/06/23 0607 08/07/23 0835  NA 130* 132*  K 4.9 4.6  CL 107 110  CO2 10* 13*  GLUCOSE 159* 137*  BUN 84* 85*  CREATININE 4.89* 5.14*  CALCIUM 8.0* 7.9*     Studies/Results: DG C-Arm 1-60 Min Result Date: 08/05/2023 CLINICAL DATA:  Cystoscopy and retrograde pyelogram EXAM: DG C-ARM 1-60 MIN  COMPARISON:  08/05/2023 FINDINGS: Eight fluoroscopic images are obtained during the performance of the procedure and are provided for interpretation only. The right ureter is cannulated and opacified, with placement of a right ureteral stent. Proximal aspect coiled within the right renal pelvis and distal aspect coiled within the bladder. Please refer to the operative report. Fluoroscopy time: 1 minute 59 seconds, 52.14 mGy IMPRESSION: 1. Intraoperative exam as above. Please refer to the operative report. Electronically Signed   By: Sharlet Salina M.D.   On: 08/05/2023 20:28      LOS: 28 days   Elmon Kirschner, NP [facility] Pager: [phone]  08/07/2023, 1:57 PM

## 2023-08-07 NOTE — Progress Notes (Signed)
 PROGRESS NOTE  Calvin Schultz:[id number] DOB: 04/09/1951 DOA: 07/10/2023 PCP: Gwenlyn Found, MD   LOS: 28 days   Brief narrative:   Calvin Schultz is a 73 y.o. male with past medical history of atrial fibrillation on Eliquis, hypertension, diabetes mellitus type 2, CKD who was recently admitted hospital for acute renal failure and left-sided hydronephrosis secondary to staghorn calculus with left UPC stricture underwent nephrostomy placement.  Rehospitalization from 2/21-3/7 at [facility] with concern for septic shock initially on norepinephrine drip related to colitis of the descending colon. C. difficile antigen and toxin positive.  Patient was noted to have AKI superimposed on CKD stage IV for which nephrology was consulted and patient was placed on CRRT.  Patient was subsequently transferred to inpatient rehab.    Patient had been complaining of left flank pain cough with productive sputum.    A CT scan of the abdomen pelvis was obtained which noted bilateral acute obstructive uropathy with evidence of bilateral collecting system hematuria with large 7- 8 mm diameter obstructing calculus of the right ureter at the pelvic inlet and new left hydronephrosis despite normally positioned double-J ureteral stent, bladder wall inflammation, anasarca, trace pelvic ascites, nodular liver contour giving concern for cirrhosis.  At this time patient has been consulted for medical management of issues.   Assessment/Plan: Principal Problem:   Right ureteral calculus Active Problems:   Acute kidney injury superimposed on chronic kidney disease (HCC)   Normocytic anemia   Chronic atrial fibrillation (HCC)   Debility   Metabolic acidosis   Pressure injury of skin   Leukocytosis   History of Clostridium difficile colitis   Controlled diabetes mellitus type 2 with complications (HCC)   Obstructive uropathy Left AKI on CKD stage IV Metabolic acidosis Latest creatinine at 4.8.  BMP from today  pending.  CO2 10.  Patient had previously been on CRRT which was stopped on 3/19.    CT revealed A CT scan of the abdomen pelvis was obtained which noted bilateral acute obstructive uropathy with evidence of bilateral collecting system hematuria with large 7- 8 mm diameter obstructing calculus of the right ureter at the pelvic inlet and new left hydronephrosis despite normally positioned double-J ureteral stent.  Urology was consulted and patient underwent cystoscopy with right retrograde pyelogram and insertion of right ureteral stent on 08/05/2023.  Patient did have staged treatment with Dr. Mena Goes for left obstructive ureteral stone on 07/24/2023.  Urine culture with yeast.  Discussed this with the primary team and suggested ID since the patient had complicated urological status with recent stent.  Initially received bicarb drip has been changed to oral bicarbonate.  Will continue to monitor renal function.   Leukocytosis Acute on chronic.  White blood cell count up to 23.8, Afebrile.  Has history of C. difficile colitis in the past and has been having diarrhea so could be treated as C. difficile again with vancomycin 125 4 times daily.  On Keflex at this time as well.  Urine culture showing yeast and Pseudomonas as well.  Advised ID consultation..  Hematuria. On Foley catheter.  Urology on board.  Status post ureteric stent placement .  Off anticoagulation.  Normocytic anemia Hemoglobin latest at 8.2.  Received 1 unit of packed RBC during hospitalization.  Continue to monitor.  CBC from today pending.   Atrial fibrillation/flutter Had hematuria so Eliquis on hold.  Rate controlled at this time.   History of C diff colitis Patient had been treated for C. difficile last month.  Patient starting to have multiple episodes of diarrhea again.  Recommended retreatment with oral vancomycin.   Controlled diabetes mellitus type 2  Hemoglobin A1c of 6.2 on 06/2023.  Continue sliding scale insulin  Accu-Cheks.  Orthostatic hypotension -Continue midodrine prn   Debility, deconditioning. Undergoing rehabilitation.   Pressure injury right buttocks stage II, left buttocks stage II.  Present on admission.  Continue wound care. Pressure Injury 07/10/23 Buttocks Right;Mid;Upper Stage 2 -  Partial thickness loss of dermis presenting as a shallow open injury with a red, pink wound bed without slough. Pink opened stage 2 right upper buttock (Active)  07/10/23 1608  Location: Buttocks  Location Orientation: Right;Mid;Upper  Staging: Stage 2 -  Partial thickness loss of dermis presenting as a shallow open injury with a red, pink wound bed without slough.  Wound Description (Comments): Pink opened stage 2 right upper buttock  Present on Admission: Yes     Pressure Injury 07/10/23 Buttocks Right;Mid;Upper Stage 2 -  Partial thickness loss of dermis presenting as a shallow open injury with a red, pink wound bed without slough. Pink stage 2 directly beside the right upper stage 2 (Active)  07/10/23 1609  Location: Buttocks  Location Orientation: Right;Mid;Upper  Staging: Stage 2 -  Partial thickness loss of dermis presenting as a shallow open injury with a red, pink wound bed without slough.  Wound Description (Comments): Pink stage 2 directly beside the right upper stage 2  Present on Admission: Yes     Pressure Injury 07/10/23 Buttocks Right;Mid;Lower Stage 2 -  Partial thickness loss of dermis presenting as a shallow open injury with a red, pink wound bed without slough. Pink stage 2 to right buttock below the upper stage 2 (Active)  07/10/23 1610  Location: Buttocks  Location Orientation: Right;Mid;Lower  Staging: Stage 2 -  Partial thickness loss of dermis presenting as a shallow open injury with a red, pink wound bed without slough.  Wound Description (Comments): Pink stage 2 to right buttock below the upper stage 2  Present on Admission: Yes     Pressure Injury 07/10/23 Buttocks  Left;Mid;Lower Stage 2 -  Partial thickness loss of dermis presenting as a shallow open injury with a red, pink wound bed without slough. Pink stage 2 to left buttock (Active)  07/10/23 1611  Location: Buttocks  Location Orientation: Left;Mid;Lower  Staging: Stage 2 -  Partial thickness loss of dermis presenting as a shallow open injury with a red, pink wound bed without slough.  Wound Description (Comments): Pink stage 2 to left buttock  Present on Admission: Yes      DVT prophylaxis: Place TED hose Start: 07/17/23 4098   Disposition: currently at CIR   Code Status:     Code Status: Full Code  Family Communication: Spoke with the patient's wife at bedside on 08/06/2023  Consultants: Urology  Procedures: PRBC transfusion Cystoscopy and double-J ureteric stent placement on the right on 08/05/2023  Anti-infectives:  Keflex orally  Anti-infectives (From admission, onward)    Start     Dose/Rate Route Frequency Ordered Stop   08/05/23 1315  cefTRIAXone (ROCEPHIN) 2 g in sodium chloride 0.9 % 100 mL IVPB        2 g 200 mL/hr over 30 Minutes Intravenous  Once 08/05/23 1308 08/05/23 1620   08/05/23 0800  cephALEXin (KEFLEX) capsule 250 mg        250 mg Oral Daily 08/04/23 1916     08/05/23 0000  cephALEXin (KEFLEX) 250 MG capsule  250 mg Oral Daily 08/05/23 0657     07/24/23 0600  cefTRIAXone (ROCEPHIN) 2 g in sodium chloride 0.9 % 100 mL IVPB        2 g 200 mL/hr over 30 Minutes Intravenous 30 min pre-op 07/23/23 1600 07/24/23 0701   07/20/23 0800  cephALEXin (KEFLEX) capsule 250 mg        250 mg Oral 2 times daily 07/17/23 0938 07/25/23 0759       Subjective: Today, patient was seen and examined at bedside.  Patient states that he has been having multiple episodes of diarrhea.  No abdominal pain nausea or vomiting.  Status post injection on the right knee.  Objective: Vitals:   08/06/23 2017 08/07/23 0444  BP: (!) 105/58 135/68  Pulse: 62 63  Resp: 18 18   Temp: 97.9 F (36.6 C) 97.8 F (36.6 C)  SpO2: 100% 99%    Intake/Output Summary (Last 24 hours) at 08/07/2023 0903 Last data filed at 08/07/2023 0445 Gross per 24 hour  Intake 240 ml  Output 975 ml  Net -735 ml   Filed Weights   08/05/23 1154 08/06/23 0500 08/07/23 0444  Weight: 105.3 kg 106.6 kg 105.2 kg   Body mass index is 34.25 kg/m.   Physical Exam:  GENERAL: Patient is alert awake and mildly Communicative, not in obvious distress.  Obese. HENT: No scleral pallor or icterus. Pupils equally reactive to light. Oral mucosa is moist NECK: is supple, no gross swelling noted. CHEST:  Diminished breath sounds bilaterally. CVS: S1 and S2 heard, no murmur.  Irregular rhythm. ABDOMEN: Soft, non-tender, bowel sounds are present.  Foley catheter in place  EXTREMITIES: Bilateral lower extremity pitting edema with tenderness over the joint line of the right. CNS: Cranial nerves are intact. No focal motor deficits. SKIN: warm and dry, pressure injury bilateral buttocks, present on admission.  Data Review: I have personally reviewed the following laboratory data and studies,  CBC: Recent Labs  Lab 08/03/23 0521 08/04/23 1939 08/05/23 0527 08/06/23 0607  WBC 19.0* 19.1* 24.4* 23.8*  HGB 7.6* 7.7* 7.3* 8.2*  HCT 25.1* 25.2* 23.8* 26.3*  MCV 87.2 86.6 87.5 85.1  PLT 368 350 372 341   Basic Metabolic Panel: Recent Labs  Lab 08/03/23 0521 08/04/23 0531 08/05/23 0916 08/06/23 0607  NA 132* 133* 132* 130*  K 4.0 4.2 4.3 4.9  CL 109 111 110 107  CO2 12* 12* 11* 10*  GLUCOSE 127* 98 87 159*  BUN 74* 76* 81* 84*  CREATININE 4.22* 4.45* 4.69* 4.89*  CALCIUM 8.1* 8.2* 8.1* 8.0*  PHOS  --   --  7.4*  --    Liver Function Tests: Recent Labs  Lab 08/03/23 0521 08/05/23 0916  AST 19  --   ALT 27  --   ALKPHOS 145*  --   BILITOT 0.2  --   PROT 5.5*  --   ALBUMIN 2.1* 2.0*   No results for input(s): "LIPASE", "AMYLASE" in the last 168 hours. No results for input(s):  "AMMONIA" in the last 168 hours. Cardiac Enzymes: No results for input(s): "CKTOTAL", "CKMB", "CKMBINDEX", "TROPONINI" in the last 168 hours. BNP (last 3 results) No results for input(s): "BNP" in the last 8760 hours.  ProBNP (last 3 results) No results for input(s): "PROBNP" in the last 8760 hours.  CBG: Recent Labs  Lab 08/05/23 1646 08/05/23 1725 08/05/23 2137 08/06/23 0616 08/07/23 0444  GLUCAP 80 92 148* 164* 143*   Recent Results (from the past 240 hours)  Body fluid culture w Gram Stain     Status: None   Collection Time: 07/31/23  2:20 PM   Specimen: Body Fluid  Result Value Ref Range Status   Specimen Description FLUID  Final   Special Requests LEFT KNEE  Final   Gram Stain   Final    ABUNDANT WBC PRESENT, PREDOMINANTLY PMN NO ORGANISMS SEEN    Culture   Final    NO GROWTH 3 DAYS Performed at [facility], [address], [place], [place] [address]    Report Status 08/03/2023 FINAL  Final  Urine Culture (for pregnant, neutropenic or urologic patients or patients with an indwelling urinary catheter)     Status: Abnormal   Collection Time: 08/04/23  2:08 PM   Specimen: Urine, Catheterized  Result Value Ref Range Status   Specimen Description URINE, CATHETERIZED  Final   Special Requests   Final    NONE Performed at [facility], [address] [place], [place] [address]    Culture >=100,000 COLONIES/mL YEAST (A)  Final   Report Status 08/05/2023 FINAL  Final     Studies: DG C-Arm 1-60 Min Result Date: 08/05/2023 CLINICAL DATA:  Cystoscopy and retrograde pyelogram EXAM: DG C-ARM 1-60 MIN COMPARISON:  08/05/2023 FINDINGS: Eight fluoroscopic images are obtained during the performance of the procedure and are provided for interpretation only. The right ureter is cannulated and opacified, with placement of a right ureteral stent. Proximal aspect coiled within the right renal pelvis and distal aspect coiled within the bladder. Please refer to the  operative report. Fluoroscopy time: 1 minute 59 seconds, 52.14 mGy IMPRESSION: 1. Intraoperative exam as above. Please refer to the operative report. Electronically Signed   By: Sharlet Salina M.D.   On: 08/05/2023 20:28      Joycelyn Das, MD  [facility] 08/07/2023  If 7PM-7AM, please contact night-coverage

## 2023-08-07 NOTE — Progress Notes (Signed)
 Occupational Therapy Session Note  Patient Details  Name: Calvin Schultz MRN: [id number] Date of Birth: [birth year]  Today's Date: 08/07/2023 OT Individual Time: 4098-1191 OT Individual Time Calculation (min): 40 min    Short Term Goals: Week 2:  OT Short Term Goal 1 (Week 2): Pt will complete LB dressing while seated on EOB with Mod A while utilizing AE as needed. OT Short Term Goal 1 - Progress (Week 2): Met OT Short Term Goal 2 (Week 2): Pt will consistently demonstrate lateral scoot transfer at Min A with use of SB when transferring from surface to surface. OT Short Term Goal 2 - Progress (Week 2): Partly met Week 3:  OT Short Term Goal 1 (Week 3): Pt will consistently demonstrate lateral scoot transfer at Min A with use of SB when transferring from surface to surface. OT Short Term Goal 1 - Progress (Week 3): Partly met OT Short Term Goal 2 (Week 3): Pt will complete toileting with Mod A while utilizing drop arm BSC. OT Short Term Goal 2 - Progress (Week 3): Not met Week 4:  OT Short Term Goal 1 (Week 4): STG = LTG (d/t ELOS)  Skilled Therapeutic Interventions/Progress Updates:    1:1 Pt received in the bed and wanting to get up out of bed. Pt came to EOB with mod A with extra time and then assistance to bring hip around to come to EOB. Pt is very swollen today but especially his left forearm is very swollen- alerted RN and then the PA came to assess.   Pt performed slide board bed to w/c with max A +2 with tech in the front assisting with maintaining feet on the floor in proper positioning and assist with forward weight shift while therapist from behind assisted pt laterally across the board with small scoots.  Pt drove to the sink with min cues for positioning of chair to fit under the sink. Pt participated in shaving, face washing and brushing his teeth with setup . Pt not able to reach head to comb hair with right Ue due to swelling and shoulder discomfort - assisted.  Pt able to steer  to get lined up next to the bed. Pt left in tilted position with feet elevated with call bell in hand.   Therapy Documentation Precautions:  Precautions Precautions: Fall Precaution/Restrictions Comments: Foley, bilateral knee pain and decreased ROM Restrictions Weight Bearing Restrictions Per Provider Order: No  Pain:  No c/o pain in session    Therapy/Group: Individual Therapy  Roney Mans [facility] 08/07/2023, 2:18 PM

## 2023-08-07 NOTE — Progress Notes (Signed)
 Physical Therapy Session Note  Patient Details  Name: Calvin Schultz MRN: [id number] Date of Birth: 09/19/1950  Today's Date: 08/07/2023 PT Individual Time: 1032-1100 PT Individual Time Calculation (min): 28 min   Short Term Goals: Week 4:  PT Short Term Goal 1 (Week 4): STG=LTG 2/2 ELOS  Skilled Therapeutic Interventions/Progress Updates:    Pt presents in room in bed, agreeable to PT. Pt denies pain at this time. Session focused on therapeutic activities for bed mobility training and therapeutic exercise for core strengthening completed in sitting EOB.  Therapist dons compression socks, bilateral knee braces and shoes with pt in supine total assist for time management. Pt completes supine to sit EOB with max assist x2 for trunk to upright, pt able to initiate BLEs off bed. Pt maintains seated balance with BUE support on hospital bed rails with pt demonstrating increased posterior and L lateral lean, worsens with fatigue. Pt able to maintain seated balance for donning shirt with CGA, requires mod assist for donning shirt.  Pillows placed behind pt back and pt completes modified sit ups from semi reclined position to short sitting to promote core strengthening needed for bed mobility and transfers. Pt completes 2x5 sit ups using therapist hands to assist with pulling to upright. Pt requires extended rest break between sets. Pt then completes sit up x2 without UE assist with increased time max cues for breathing and positioning.  Pt completes sit to supine with max assist x2 for BLE/trunk management, pt able to initiate trunk to sidelying but requires assist for BLEs into bed secondary to weakness and fatigue. Pt able to center self in bed with use of hospital bed rails, pt boosted to [facility] with max assist x2.  Pt remains supine in bed with all needs within reach, call light in place, bed alarm activated, all 4 rails elevated per pt request at end of session.  Therapy Documentation Precautions:   Precautions Precautions: Fall Precaution/Restrictions Comments: Foley, bilateral knee pain and decreased ROM Restrictions Weight Bearing Restrictions Per Provider Order: No  Therapy/Group: Individual Therapy  Edwin Cap PT, DPT 08/07/2023, 1:00 PM

## 2023-08-08 DIAGNOSIS — N3001 Acute cystitis with hematuria: Secondary | ICD-10-CM

## 2023-08-08 LAB — CBC
HCT: 28.3 % — ABNORMAL LOW (ref 39.0–52.0)
Hemoglobin: 9.1 g/dL — ABNORMAL LOW (ref 13.0–17.0)
MCH: 26.9 pg (ref 26.0–34.0)
MCHC: 32.2 g/dL (ref 30.0–36.0)
MCV: 83.7 fL (ref 80.0–100.0)
Platelets: 365 10*3/uL (ref 150–400)
RBC: 3.38 MIL/uL — ABNORMAL LOW (ref 4.22–5.81)
RDW: 16.6 % — ABNORMAL HIGH (ref 11.5–15.5)
WBC: 44.3 10*3/uL — ABNORMAL HIGH (ref 4.0–10.5)
nRBC: 0.1 % (ref 0.0–0.2)

## 2023-08-08 LAB — BASIC METABOLIC PANEL WITH GFR
Anion gap: 10 (ref 5–15)
BUN: 85 mg/dL — ABNORMAL HIGH (ref 8–23)
CO2: 14 mmol/L — ABNORMAL LOW (ref 22–32)
Calcium: 7.8 mg/dL — ABNORMAL LOW (ref 8.9–10.3)
Chloride: 109 mmol/L (ref 98–111)
Creatinine, Ser: 5.43 mg/dL — ABNORMAL HIGH (ref 0.61–1.24)
GFR, Estimated: 11 mL/min — ABNORMAL LOW (ref >60)
Glucose, Bld: 93 mg/dL (ref 70–99)
Potassium: 4.1 mmol/L (ref 3.5–5.1)
Sodium: 133 mmol/L — ABNORMAL LOW (ref 135–145)

## 2023-08-08 LAB — GLUCOSE, CAPILLARY
Glucose-Capillary: 102 mg/dL — ABNORMAL HIGH (ref 70–99)
Glucose-Capillary: 100 mg/dL — ABNORMAL HIGH (ref 70–99)

## 2023-08-08 MED ORDER — FUROSEMIDE 10 MG/ML IJ SOLN
60.0000 mg | Freq: Two times a day (BID) | INTRAMUSCULAR | Status: AC
Start: 2023-08-08 — End: 2023-08-09
  Administered 2023-08-08 – 2023-08-09 (×2): 60 mg via INTRAVENOUS
  Filled 2023-08-08 (×4): qty 6

## 2023-08-08 MED ORDER — ALBUMIN HUMAN 25 % IV SOLN
25.0000 g | Freq: Two times a day (BID) | INTRAVENOUS | Status: AC
  Administered 2023-08-08 (×2): 25 g via INTRAVENOUS
  Filled 2023-08-08 (×2): qty 100

## 2023-08-08 MED ORDER — SODIUM CHLORIDE 0.9 % IV SOLN: INTRAVENOUS | Status: AC | PRN

## 2023-08-08 NOTE — Progress Notes (Signed)
 [facility] Progress Note   Subjective:  Back pain remains resolved.  UOP with IV lasix 60 IV.     No uremic symptoms.  Appetite ok now - eating/drinking. C/o worsening edema.   Objective Vitals:   08/07/23 1430 08/07/23 2135 08/08/23 0346 08/08/23 0500  BP: 125/64 123/62 113/62   Pulse: 65 66 67   Resp: 17 18 18    Temp: 97.9 F (36.6 C) 97.7 F (36.5 C) 98 F (36.7 C)   TempSrc: Oral     SpO2:  100% 99%   Weight:    105.2 kg  Height:       Physical Exam General: lying flat, comfortable lying still Heart: RRR Lungs: clear Abdomen: soft Extremities: 3+ L tibial edema 3+ R tibial, LUE 3+ RUE 2+ GU:  foley draining bloody urine, no clots  Additional Objective Labs: Basic Metabolic Panel: Recent Labs  Lab 08/05/23 0916 08/06/23 0607 08/07/23 0835 08/08/23 0854  NA 132* 130* 132* 133*  K 4.3 4.9 4.6 4.1  CL 110 107 110 109  CO2 11* 10* 13* 14*  GLUCOSE 87 159* 137* 93  BUN 81* 84* 85* 85*  CREATININE 4.69* 4.89* 5.14* 5.43*  CALCIUM 8.1* 8.0* 7.9* 7.8*  PHOS 7.4*  --   --   --    Liver Function Tests: Recent Labs  Lab 08/03/23 0521 08/05/23 0916  AST 19  --   ALT 27  --   ALKPHOS 145*  --   BILITOT 0.2  --   PROT 5.5*  --   ALBUMIN 2.1* 2.0*   No results for input(s): "LIPASE", "AMYLASE" in the last 168 hours. CBC: Recent Labs  Lab 08/04/23 1939 08/05/23 0527 08/06/23 0607 08/07/23 0835 08/08/23 0854  WBC 19.1* 24.4* 23.8* 35.3* 44.3*  HGB 7.7* 7.3* 8.2* 8.6* 9.1*  HCT 25.2* 23.8* 26.3* 27.4* 28.3*  MCV 86.6 87.5 85.1 84.6 83.7  PLT 350 372 341 361 365   Blood Culture    Component Value Date/Time   SDES URINE, CATHETERIZED 08/04/2023 1408   SPECREQUEST  08/04/2023 1408    NONE Performed at [facility], [address], [place], [place] [address]    CULT >=100,000 COLONIES/mL YEAST (A) 08/04/2023 1408   REPTSTATUS 08/05/2023 FINAL 08/04/2023 1408    Cardiac Enzymes: No results for input(s): "CKTOTAL",  "CKMB", "CKMBINDEX", "TROPONINI" in the last 168 hours. CBG: Recent Labs  Lab 08/05/23 1725 08/05/23 2137 08/06/23 0616 08/07/23 0444 08/08/23 0643  GLUCAP 92 148* 164* 143* 102*   Iron Studies: No results for input(s): "IRON", "TIBC", "TRANSFERRIN", "FERRITIN" in the last 72 hours. @lablastinr3 @ Studies/Results: No results found.  Medications:  ceFEPime (MAXIPIME) IV Stopped (08/07/23 1806)      Chlorhexidine Gluconate Cloth  6 each Topical BID   darbepoetin (ARANESP) injection - NON-DIALYSIS  40 mcg Subcutaneous Q Fri-1800   diclofenac Sodium  2 g Topical QID   iron polysaccharides  150 mg Oral Daily   multivitamin  1 tablet Oral QHS   Muscle Rub   Topical TID   psyllium  1 packet Oral BID   sodium bicarbonate  1,300 mg Oral TID   tamsulosin  0.8 mg Oral QPC supper   traMADol  50 mg Oral BID   traZODone  75 mg Oral QHS   vancomycin  125 mg Oral QID    Assessment/Plan:73yo M A fib, DM, A fib on eliquis, nephrolithiasis s/p recent PCN for staghorn calculus currently admitted to CIR for debility and nephrology  is reconsulted for AKI on CKD.  **AKI on CKD 4:   Baseline kidney function 05/2023 Cr 4.1 - 5.3 f/b CKA.  After CRRT stopped his creatinine improved to nadir of 3.2 on 3/19 and has gradually trended up 3/27 3.6 > 3/31 4.2 > 4/1 4.45 .  Found to have obstructing stone on R s/p OR with ureteral stent 4/2.  Cr trending up further today 5.14 > 5.4.  Remains nonoliguric with no uremic symptoms; no RRT needed at this time but discussed need may arise in coming days.  Give albumin 25g x 2 and lasix 60 x 2 today and holding IVF.  Daily labs for now.  Strict I/Os.  Avoid hypotension and nephrotoxins.    **Anemia, normocytic:  Transfuse as needed for Hb < 7.  Having acute blood loss through foley but H/H stable.  Eliquis has been held.  On aranesp.   **Metabolic acidosis:  worsening in context of AKI; in light of worsening overload d/cd bicarb gtt and switched to oral. Serum bicarb  slowly improving.   **A fib:  eliquis on hold for acute bleeding  **Orthostatic hypotension:  has been on midodrine for the last few weeks, BPs look ok currently.   **Debility:  on CIR  Will follow closely with you, please reach out with concerns.   Estill Bakes MD 08/08/2023, 9:40 AM  [facility] Pager: [phone]

## 2023-08-08 NOTE — Progress Notes (Signed)
 PROGRESS NOTE   Subjective/Complaints: Continue to have some loose bowel movements at night.  Urine starting to clear up.  Continues to have edema, nephrology giving albumin.  Knee pain a little better since injections.   ROS: Patient denies fever, new vision changes, dizziness, nausea, vomiting shortness of breath or chest pain, headache, or mood change. Diarrhea Knee pain-left improved, right also improving Hematuria continued + fatigue + Edema     Objective:   No results found.     Recent Labs    08/07/23 0835 08/08/23 0854  WBC 35.3* 44.3*  HGB 8.6* 9.1*  HCT 27.4* 28.3*  PLT 361 365     Recent Labs    08/07/23 0835 08/08/23 0854  NA 132* 133*  K 4.6 4.1  CL 110 109  CO2 13* 14*  GLUCOSE 137* 93  BUN 85* 85*  CREATININE 5.14* 5.43*  CALCIUM 7.9* 7.8*      Intake/Output Summary (Last 24 hours) at 08/08/2023 1304 Last data filed at 08/08/2023 4098 Gross per 24 hour  Intake 100 ml  Output 1925 ml  Net -1825 ml     Pressure Injury 07/10/23 Buttocks Right;Mid;Upper Stage 2 -  Partial thickness loss of dermis presenting as a shallow open injury with a red, pink wound bed without slough. Pink opened stage 2 right upper buttock (Active)  07/10/23 1608  Location: Buttocks  Location Orientation: Right;Mid;Upper  Staging: Stage 2 -  Partial thickness loss of dermis presenting as a shallow open injury with a red, pink wound bed without slough.  Wound Description (Comments): Pink opened stage 2 right upper buttock  Present on Admission: Yes     Pressure Injury 07/10/23 Buttocks Right;Mid;Upper Stage 2 -  Partial thickness loss of dermis presenting as a shallow open injury with a red, pink wound bed without slough. Pink stage 2 directly beside the right upper stage 2 (Active)  07/10/23 1609  Location: Buttocks  Location Orientation: Right;Mid;Upper  Staging: Stage 2 -  Partial thickness loss of dermis  presenting as a shallow open injury with a red, pink wound bed without slough.  Wound Description (Comments): Pink stage 2 directly beside the right upper stage 2  Present on Admission: Yes     Pressure Injury 07/10/23 Buttocks Right;Mid;Lower Stage 2 -  Partial thickness loss of dermis presenting as a shallow open injury with a red, pink wound bed without slough. Pink stage 2 to right buttock below the upper stage 2 (Active)  07/10/23 1610  Location: Buttocks  Location Orientation: Right;Mid;Lower  Staging: Stage 2 -  Partial thickness loss of dermis presenting as a shallow open injury with a red, pink wound bed without slough.  Wound Description (Comments): Pink stage 2 to right buttock below the upper stage 2  Present on Admission: Yes     Pressure Injury 07/10/23 Buttocks Left;Mid;Lower Stage 2 -  Partial thickness loss of dermis presenting as a shallow open injury with a red, pink wound bed without slough. Pink stage 2 to left buttock (Active)  07/10/23 1611  Location: Buttocks  Location Orientation: Left;Mid;Lower  Staging: Stage 2 -  Partial thickness loss of dermis presenting as a shallow open injury with  a red, pink wound bed without slough.  Wound Description (Comments): Pink stage 2 to left buttock  Present on Admission: Yes    Physical Exam: Vital Signs Blood pressure 113/62, pulse 67, temperature 98 F (36.7 C), resp. rate 18, height 5\' 9"  (1.753 m), weight 105.2 kg, SpO2 99%.  Constitutional: No distress . Vital signs reviewed.  Sitting in bed. Obese, Appears comfortable HEENT: NCAT, EOMI, oral membranes moist Neck: supple Cardiovascular: RRR Respiratory/Chest: CTA Bilaterally without wheezes or rales. Normal effort    GI/Abdomen: BS +, non-tender, non-distended Ext: no clubbing, cyanosis, + 3 + B/L LE and  3+ LUE edema and 2+ RUE edema   GU: Foley with bloody urine, appears last bloody than yesterday  Psych: pleasant and cooperative Skin: No evidence of breakdown,  no evidence of rash Neurologic: Cranial nerves II through XII grossly intact, motor strength is moving all 4 extremities to gravity and resistance    MSK: Bilateral knee effusions ongoing Left shoulder tight in ER/IR/F/E            Assessment/Plan: 1. Functional deficits which require 3+ hours per day of interdisciplinary therapy in a comprehensive inpatient rehab setting. Physiatrist is providing close team supervision and 24 hour management of active medical problems listed below. Physiatrist and rehab team continue to assess barriers to discharge/monitor patient progress toward functional and medical goals  Care Tool:  Bathing  Bathing activity did not occur:  (patient completed a simulated task at bed LOF) Body parts bathed by patient: Right arm, Left arm, Chest, Abdomen, Front perineal area, Right upper leg, Left upper leg, Face   Body parts bathed by helper: Buttocks, Left lower leg, Right lower leg     Bathing assist Assist Level: Maximal Assistance - Patient 24 - 49%     Upper Body Dressing/Undressing Upper body dressing   What is the patient wearing?: Pull over shirt    Upper body assist Assist Level: Contact Guard/Touching assist    Lower Body Dressing/Undressing Lower body dressing      What is the patient wearing?: Pants, Underwear/pull up, Incontinence brief     Lower body assist Assist for lower body dressing: Maximal Assistance - Patient 25 - 49%     Toileting Toileting    Toileting assist Assist for toileting: 2 Helpers     Transfers Chair/bed transfer  Transfers assist  Chair/bed transfer activity did not occur: Safety/medical concerns (not safe to get up)  Chair/bed transfer assist level: Maximal Assistance - Patient 25 - 49%     Locomotion Ambulation   Ambulation assist   Ambulation activity did not occur: Safety/medical concerns  Assist level: Dependent - Patient 0% Assistive device: Other (comment) (sara plus) Max distance: 18  steps   Walk 10 feet activity   Assist  Walk 10 feet activity did not occur: Safety/medical concerns        Walk 50 feet activity   Assist Walk 50 feet with 2 turns activity did not occur: Safety/medical concerns         Walk 150 feet activity   Assist Walk 150 feet activity did not occur: Safety/medical concerns         Walk 10 feet on uneven surface  activity   Assist Walk 10 feet on uneven surfaces activity did not occur: Safety/medical concerns         Wheelchair     Assist Is the patient using a wheelchair?: Yes Type of Wheelchair: Power    Wheelchair assist level: Supervision/Verbal cueing  Max wheelchair distance: 300+    Wheelchair 50 feet with 2 turns activity    Assist        Assist Level: Supervision/Verbal cueing   Wheelchair 150 feet activity     Assist      Assist Level: Supervision/Verbal cueing   Blood pressure 113/62, pulse 67, temperature 98 F (36.7 C), resp. rate 18, height 5\' 9"  (1.753 m), weight 105.2 kg, SpO2 99%.   Medical Problem List and Plan: 1. Functional deficits secondary to debility related to septic shock/C. difficile colitis/AKI superimposed on CKD stage IV             -patient may shower with tubes/drains covered             -ELOS/Goals:  3/28, min assist PT/OT             -Continue CIR therapies including PT, OT   -Team meeting scheduled for Tuesday  -Discussed with plan with patient and wife on rounds and then called wife later this morning for continued discussion.  Will plan team meeting for Tuesday at 1 PM.  -Daily therapy for now   2.  Antithrombotics: -DVT/anticoagulation:  Pharmaceutical: Eliquis             -antiplatelet therapy: N/A   3. Pain Management: tramadol prn, Zanaflex 4 mg twice daily as needed   Biliateral OA of knees  -ordered bilateral neoprine knee sleeves --try today  -voltaren gel tid to knees  -have scheduled tramadol 50mg  at 0700 and 1200 daily. Continue prn as  well  Mild adhesive capsulitis left shoulder---  sports cream. Can use voltaren gel also. Aggressive ROM with therapy and while in bed--continue  -Will check right knee x-ray, left knee x-ray on 3/8 with advanced OA.  Will consult orthopedics for cortisone injections  -3-28: Orthopedics aspirated and injected left knee, with improvement.  Ongoing pain with right knee.  3/31 Knee fluid calcium pyrophosphate cyrystals, pseudogout?, cultures with no growth  -4/2 hydrocodone PRN started yesterday for flank pain  4/3 reconsult orthopedics, will come up and talk to him today about his right knee.  I had spoken with orthopedics on 4/1 are considering oral steroids due to crystals, pseudogout?  But held off due to more acute medical issues  4/4 right knee is feeling better continue to monitor  4/5 continues to be improved although has not had therapy today    4. Mood/Behavior/Sleep: Trazodone 50 mg nightly as needed.  Provide emotional support             -antipsychotic agents: N/A   5. Neuropsych/cognition: This patient is capable of making decisions on his own behalf.   6. Skin/Wound Care: Routine skin checks   7. Fluids/Electrolytes/Nutrition:    -3/14 po intake remains poor, albumin low, weights falling  -will ask RD for assistance. Have spoken to patient about intake  -?IV albumin (see below)  3/18 discussed with dietitian, patient recorded is eating 0% of his meals.  Appears that wife bringing in 3 meals a day and he is eating about 75%, appears to have fairly adequate intake.  3/27 eating frequent outside foods-doesn't like hospital food as much  8.  Oliguric AKI superimposed on CKD stage IV.  Baseline creatinine 4.9.  CRRT discontinued 2/25.  No current plan for long-term dialysis.  Follow-up renal services              - Per discussion with nephrology Dr. Verna Czech, keep Foley catheter in to allow accurate I's  and O's through Monday; then, can remove and do DC Foley trial while continuing  to document strict outputs             - Continue daily assessments of renal function per nephrology   3/8- per renal, Cr leveling out- and con't Foley- will remove Monday  -nephrology following, Cr remains in 4's.   3/14 renal function with some improvement today     -suspect lower extremity edema is related to renal function/low albumin   Appreciate nephro input   -3/17 BUN/creatinine slightly improved 34/3.31, appreciate nephrology assistance  3/19 BUN/CR stable at 34/3.24, nephrology signing off, ok to check BMET twice a week  3/23 encouraging po this weekend. happy that he's eating food from home  3/24 BUN/CR overall stable at 40/3.26  -3/31 BUN/Cr a little higher today, IVF 31ml/hr  -4/1 Cr a little higher again, will contact nephrology  -4/3 nephrology note reviewed, IV Lasix discontinued and he was given some Lasix today, changed oral bicarb by nephrology, can give IV as needed  4/4 patient has a lot of edema, nephrology reducing Lasix today and holding IVF creatinine higher at 5.14  4/5 reviewed nephrology note, patient getting albumin and Lasix today  9.  C. difficile colitis.  Completed course of oral vancomycin 3/7.  DC'd IV Flagyl 3/3. Enteric precautions   3/14 stools formed this morning   3/17 discussed with ID pharmacy, patient having more frequent bowel movements.  Monitor today and if continues consider Dificid treatment  3/18 diarrhea appears to be improving, continue to monitor for now  3/19 Frequent Bms improved, continue to follow  3/21 having more frequent bowel movements again today, continue to monitor and if frequent diarrhea persists this weekend consider treatment with Dificid  3/23- mushy/lqiuid stool again overnight    -no dificid at this point. Discuss with GI first on Monday    -added fiber to regimen yesterday, will increase to bid today  3/24 mushy/ liquid BMs, about 2 a day yesterday, more other days- will discuss GI  3/25 GI recommended repeat C.  difficile screening. Discussed with [facility], hold off on repeat screening test, he only had about 2 bowel movements a day but if continues to have frequent BMs consider retreatment with oral vancomycin for 10 days  3/26 pt reports Bms more firm, continue to monitor   3/28 patient reports BMs have been more solid, continue to monitor.  If worsening diarrhea occurs consider retreatment with vancomycin oral for 10 days  3-29: Diarrhea this a.m., no bowel movements this afternoon recorded  3-30: No BM since yesterday.  3-31 diarrhea continues to be improved overall  4-3 LBM yesterday, reports diarrhea continues to be improved  -4/4 patient with increased diarrhea, discussed with hospitalist and ID.  Restarted oral vancomycin 125 mg 4 times a day for 10 days  -4/5 continue vancomycin oral, hospitalist following appreciate assistance  10.  Status post left nephrostomy tube d/t staghorn calculi. Has Foley Nephrostomy tube placed 1/29 per IR with plan for ureteric stent placement 3/21              - Careful monitoring of nephrostomy output  -3/14 flomax has been added to improve emptying  -3/21 patient scheduled today for cystoscopy with left retrograde pyelogram, left ureteroscopy laser lithotripsy and left ureteral stent placement.     3/22- procedure appears successful thus far.    -urine clear, patient comfortable   -foley to come out on Monday per urology  3/25  Foley was removed patient continues to have urinary retention requiring IC.  Increase Flomax to 0.8 mg  3/26 intermittent blood noted with IC, if continues will restart foley  3/27 Urine no longer blood tinged, continue current regimen, consider restart foley if reoccurs  3/27 continues to require catheterization but no longer blood-tinged Addendum 3/28 foley started after traumatic cath, possible false passage, continue foley for now, f/u with urology outpatient 4/1 foley with hematuria, denies known trauma to this, will ask nursing  to check bladder scan. U/A culture ordered.  Called his urologist to discuss 4/2 CT abdomen/pelvis-bilateral obstructive uropathy with collecting system hematuria, large calculus in right ureter and pelvic inlet, new left hydronephrosis, new anasarca, liver nodules raising possibility of cirrhosis.  Discussed patient with urology - surgical procedure today  Addendum discussed with urology, will hold eliquis for now due to bleeding, check EKG- A flutter -4/3 cystoscopy, right retrograde pyelogram, right ureteral stent insertion yesterday by Dr. Pete Glatter, further procedure scheduled on 4/15 with Dr. Mena Goes.  Discussed with [facility] urology.  Do not think traumatic cath last week caused recurrent bleeding his kidney, his wife was concerned about this.  Patient was having nonbloody urine for several days on the Foley.  Will continue Foley and not attempt intermittent cath.  Patient suspected to have false passage. 4/4 urine appears less dark today, urology considering TXA tomorrow if bleeding is not resolved, Eliquis on hold 4/5 appreciate urology assistance, bleeding does appear to be improving, only blood-tinged urine in Foley tubing.  Consider restart Eliquis when   11.  Chronic atrial fibrillation with episodes of bradycardia.  Followed by cardiology services.  Continue Eliquis.  Avoid AV nodal blocking agents   12.  Diabetes mellitus.  Hemoglobin A1c 6.2.    -tightly controlled. Dc SSI and change cbg checks to qam only   CBG (last 3)  Recent Labs    08/06/23 0616 08/07/23 0444 08/08/23 0643  GLUCAP 164* 143* 102*       3-30: Recently increased.  Somewhat expected with intra-articular steroid injection.  Continue to trend.  4/5 controlled, continue to monitor  13.  Hypotension.  ProAmatine 10 mg 3 times daily.  Monitor with increased mobility   - Normotensive on admission  3/9- BP running 100-s to 120s systolic- con't regimen  3/19 BP has been on high side, will change  midodrine to 5mg  TID PRN orthostatic hypotension  3/28 BP stable, continue current regimen  -4/3-5 BP continue current regimen and monitor     08/08/2023    5:00 AM 08/08/2023    3:46 AM 08/07/2023    9:35 PM  Vitals with BMI  Weight 231 lbs 15 oz    BMI 34.23    Systolic  113 123  Diastolic  62 62  Pulse  67 66    14.  Chronic anemia.  Niferex daily/Aranesp.  Monitor for any bleeding episodes   3/8- Hb 7.1 today- will order transfusion- transfusion of 1 unit pRBCs.   3/14 hgb up to 9.1  -3/17 hemoglobin down to 8.4, continue to monitor  -3/19 HGB 8.3 today, stable overall, continue to monitor    -3/27 HGB 7.7- a little lower likely due to GU procedure/hematuria, continue to monitor  3/31 HGB 7.6 today, transfuse 7 or less  4/3 hemoglobin stable today after transfusion yesterday  16. Leukocytosis  3/9- Pt's WBC is 21.7- up from 16k- is afebrile- due to his recent C diff, that just finished correct dosing of ABX for, called  ID for guidance on treatment- don't want to cause more Cdiff- ID consulted ---no new orders  3/10 WBC's down to 17k  --have been hovering above and below 20 for awhile. No new clinical symptoms. Nephrostomy drainage clear, yellow  3/14 wbc's down to 13k.--recheck Monday   3/17 WBC is down to 12.4  3/19 WBC 13.4 today, continue to monitor  3/24 WBC higher today 18.7, Discuss C diff treatment with GI a above 3/31 WBC still elevated 19.0, monitor for signs of infection 4/3 WBC still elevated to 23.8, potentially due to his urological issues and procedure.  Hospitalist also following appreciate assistance  4/4 WBC higher today, oral vancomycin started as above.  Discussed with ID Patrecia Pace and hospitalist.  Patient had Pseudomonas resulting in prior urine culture.  Recommended cefepime for approximately 10 days depending on WBC.  Called pharmacy for cefepime dosing.  ID recommended replacing Foley due to yeast on last culture, discussed with urology who indicates  this was already done so hold off on changing again.  4/5 continue current antibiotic regimen, hospitalist team following.  WBC higher today-discussed with hospitalist yesterday, often see higher WBCs with C. difficile.  Continue monitor trend daily.  Patient remains afebrile and vital stable  17. Decreased mood reported by therapy  -Consider SSRI  -3/20 patient reports his mood is okay, denies depression this morning.  Continue to monitor  18. Insomnia   -3/20 schedule trazodone and increase to 75mg  at bedtime  -3/21 insomnia improved but bowel movements at night did wake him up a few times  3/23 sleeping better when stooling isn't an issue  19. LE edema  -Check albumin/cmp with next labs. Leg elevation and compression   -Significantly improved on the left after steroid injection and effusion tap.  -IVF discontinued as above  - Nephrology following, treating with Lasix, albumin  LOS: 29 days A FACE TO FACE EVALUATION WAS PERFORMED  Fanny Dance 08/08/2023, 1:04 PM

## 2023-08-08 NOTE — Progress Notes (Signed)
 Gave 25 g of Albumin first before giving ordered Furosemide 60 mg. Current BP 113/56 (71) and Provider initiated to still give furosemide.

## 2023-08-08 NOTE — Progress Notes (Signed)
 PROGRESS NOTE  Calvin Schultz ZOX:[id number] DOB: 28-Sep-1950 DOA: 07/10/2023 PCP: Gwenlyn Found, MD   LOS: 29 days   Brief narrative:   Calvin Schultz is a 73 y.o. male with past medical history of atrial fibrillation on Eliquis, hypertension, diabetes mellitus type 2, CKD who was recently admitted hospital for acute renal failure and left-sided hydronephrosis secondary to staghorn calculus with left UPC stricture underwent nephrostomy placement.  Rehospitalization from 2/21-3/7 at [facility] with concern for septic shock initially on norepinephrine drip related to colitis of the descending colon. C. difficile antigen and toxin positive.  Patient was noted to have AKI superimposed on CKD stage IV for which nephrology was consulted and patient was placed on CRRT.  Patient was subsequently transferred to inpatient rehab.    Patient had been complaining of left flank pain cough with productive sputum.    A CT scan of the abdomen pelvis was obtained which noted bilateral acute obstructive uropathy with evidence of bilateral collecting system hematuria with large 7- 8 mm diameter obstructing calculus of the right ureter at the pelvic inlet and new left hydronephrosis despite normally positioned double-J ureteral stent, bladder wall inflammation, anasarca, trace pelvic ascites, nodular liver contour giving concern for cirrhosis.  At this time patient has been consulted for medical management of issues.   Assessment/Plan: Principal Problem:   Right ureteral calculus Active Problems:   Acute kidney injury superimposed on chronic kidney disease (HCC)   Normocytic anemia   Chronic atrial fibrillation (HCC)   Debility   Metabolic acidosis   Pressure injury of skin   Leukocytosis   History of Clostridium difficile colitis   Controlled diabetes mellitus type 2 with complications (HCC)   Acute cystitis with hematuria   C. difficile diarrhea   Obstructive uropathy Left AKI on CKD stage IV Metabolic  acidosis  Latest creatinine at 5.4 from 5.1 yesterday.   Patient had previously been on CRRT which was stopped on 3/19.    CT scan of the abdomen noted bilateral acute obstructive uropathy with evidence of bilateral collecting system hematuria with large 7- 8 mm diameter obstructing calculus of the right ureter at the pelvic inlet and new left hydronephrosis despite normally positioned double-J ureteral stent.  Urology was consulted and patient underwent cystoscopy with right retrograde pyelogram and insertion of right ureteral stent on 08/05/2023.  Patient did have staged treatment with Dr. Mena Goes for left obstructive ureteral stone on 07/24/2023.  Urine culture with yeast.  Spoke with the primary team who had spoken with ID in thought process is colonization.  At this time patient has been started on cefepime.  Will continue to monitor renal function.  Nonoliguric and without uremic symptoms.  Plan for albumin and Lasix today as per nephrology.   Leukocytosis Acute on chronic.  White blood cell count up to 44.3 today from 23.8, Afebrile.  Has history of C. difficile colitis in the past and has been having diarrhea so treated for C. difficile again with oral vancomycin.  Urine culture showing yeast and Pseudomonas as well.  Advised ID consultation patient currently on cefepime.  Hematuria. On Foley catheter.  Urology on board.  Status post ureteric stent placement .  Off anticoagulation.  Normocytic anemia Hemoglobin latest at 9.1 from 8.2 yesterday..  Received 1 unit of packed RBC during hospitalization.  Continue to monitor.  Monitor periodically.   Atrial fibrillation/flutter Had hematuria so Eliquis on hold.  Rate controlled at this time.   History of C diff colitis Has been  started on oral vancomycin due to ongoing diarrhea.   Controlled diabetes mellitus type 2  Hemoglobin A1c of 6.2 on 06/2023.  Continue sliding scale insulin Accu-Cheks.  Orthostatic hypotension -Continue midodrine prn    Debility, deconditioning. Undergoing rehabilitation.   Pressure injury right buttocks stage II, left buttocks stage II.  Present on admission.  Continue wound care. Pressure Injury 07/10/23 Buttocks Right;Mid;Upper Stage 2 -  Partial thickness loss of dermis presenting as a shallow open injury with a red, pink wound bed without slough. Pink opened stage 2 right upper buttock (Active)  07/10/23 1608  Location: Buttocks  Location Orientation: Right;Mid;Upper  Staging: Stage 2 -  Partial thickness loss of dermis presenting as a shallow open injury with a red, pink wound bed without slough.  Wound Description (Comments): Pink opened stage 2 right upper buttock  Present on Admission: Yes     Pressure Injury 07/10/23 Buttocks Right;Mid;Upper Stage 2 -  Partial thickness loss of dermis presenting as a shallow open injury with a red, pink wound bed without slough. Pink stage 2 directly beside the right upper stage 2 (Active)  07/10/23 1609  Location: Buttocks  Location Orientation: Right;Mid;Upper  Staging: Stage 2 -  Partial thickness loss of dermis presenting as a shallow open injury with a red, pink wound bed without slough.  Wound Description (Comments): Pink stage 2 directly beside the right upper stage 2  Present on Admission: Yes     Pressure Injury 07/10/23 Buttocks Right;Mid;Lower Stage 2 -  Partial thickness loss of dermis presenting as a shallow open injury with a red, pink wound bed without slough. Pink stage 2 to right buttock below the upper stage 2 (Active)  07/10/23 1610  Location: Buttocks  Location Orientation: Right;Mid;Lower  Staging: Stage 2 -  Partial thickness loss of dermis presenting as a shallow open injury with a red, pink wound bed without slough.  Wound Description (Comments): Pink stage 2 to right buttock below the upper stage 2  Present on Admission: Yes     Pressure Injury 07/10/23 Buttocks Left;Mid;Lower Stage 2 -  Partial thickness loss of dermis presenting as  a shallow open injury with a red, pink wound bed without slough. Pink stage 2 to left buttock (Active)  07/10/23 1611  Location: Buttocks  Location Orientation: Left;Mid;Lower  Staging: Stage 2 -  Partial thickness loss of dermis presenting as a shallow open injury with a red, pink wound bed without slough.  Wound Description (Comments): Pink stage 2 to left buttock  Present on Admission: Yes      DVT prophylaxis: Place TED hose Start: 07/17/23 1308   Disposition: currently at CIR   Code Status:     Code Status: Full Code  Family Communication: Spoke with the patient's wife at bedside on 08/08/2023  Consultants: Urology  Procedures: PRBC transfusion Cystoscopy and double-J ureteric stent placement on the right on 08/05/2023  Anti-infectives:  Cefepime IV  Anti-infectives (From admission, onward)    Start     Dose/Rate Route Frequency Ordered Stop   08/07/23 1600  ceFEPIme (MAXIPIME) 2 g in sodium chloride 0.9 % 100 mL IVPB        2 g 200 mL/hr over 30 Minutes Intravenous Every 24 hours 08/07/23 1432 08/17/23 1559   08/07/23 1200  vancomycin (VANCOCIN) capsule 125 mg        125 mg Oral 4 times daily 08/07/23 0946 08/17/23 0759   08/07/23 0930  vancomycin (VANCOCIN) 50 mg/mL oral solution SOLN 125 mg  Status:  Discontinued        125 mg Per Tube 4 times daily 08/07/23 0908 08/07/23 0946   08/05/23 1315  cefTRIAXone (ROCEPHIN) 2 g in sodium chloride 0.9 % 100 mL IVPB        2 g 200 mL/hr over 30 Minutes Intravenous  Once 08/05/23 1308 08/05/23 1620   08/05/23 0800  cephALEXin (KEFLEX) capsule 250 mg  Status:  Discontinued        250 mg Oral Daily 08/04/23 1916 08/07/23 1432   08/05/23 0000  cephALEXin (KEFLEX) 250 MG capsule        250 mg Oral Daily 08/05/23 0657     07/24/23 0600  cefTRIAXone (ROCEPHIN) 2 g in sodium chloride 0.9 % 100 mL IVPB        2 g 200 mL/hr over 30 Minutes Intravenous 30 min pre-op 07/23/23 1600 07/24/23 0701   07/20/23 0800  cephALEXin (KEFLEX)  capsule 250 mg        250 mg Oral 2 times daily 07/17/23 0938 07/25/23 0759       Subjective: Today, patient was seen and examined at bedside.  Patient states that he has been tired has been having frequent bowel movements.  Difficulty sleeping due to diarrhea.  Knee pain is better.  Has been eating and drinking.  Objective: Vitals:   08/07/23 2135 08/08/23 0346  BP: 123/62 113/62  Pulse: 66 67  Resp: 18 18  Temp: 97.7 F (36.5 C) 98 F (36.7 C)  SpO2: 100% 99%    Intake/Output Summary (Last 24 hours) at 08/08/2023 1052 Last data filed at 08/08/2023 0852 Gross per 24 hour  Intake 100 ml  Output 1926 ml  Net -1826 ml   Filed Weights   08/06/23 0500 08/07/23 0444 08/08/23 0500  Weight: 106.6 kg 105.2 kg 105.2 kg   Body mass index is 34.25 kg/m.   Physical Exam:  GENERAL: Patient is alert awake and mildly Communicative, not in obvious distress.  Obese. HENT: No scleral pallor or icterus. Pupils equally reactive to light. Oral mucosa is moist NECK: is supple, no gross swelling noted. CHEST:  Diminished breath sounds bilaterally. CVS: S1 and S2 heard, no murmur.  Irregular rhythm. ABDOMEN: Soft, non-tender, bowel sounds are present.  Foley catheter in place with dark urine EXTREMITIES: Bilateral lower extremity pitting edema noted. CNS: Cranial nerves are intact. No focal motor deficits. SKIN: warm and dry, pressure injury bilateral buttocks, present on admission.  Data Review: I have personally reviewed the following laboratory data and studies,  CBC: Recent Labs  Lab 08/04/23 1939 08/05/23 0527 08/06/23 0607 08/07/23 0835 08/08/23 0854  WBC 19.1* 24.4* 23.8* 35.3* 44.3*  HGB 7.7* 7.3* 8.2* 8.6* 9.1*  HCT 25.2* 23.8* 26.3* 27.4* 28.3*  MCV 86.6 87.5 85.1 84.6 83.7  PLT 350 372 341 361 365   Basic Metabolic Panel: Recent Labs  Lab 08/04/23 0531 08/05/23 0916 08/06/23 0607 08/07/23 0835 08/08/23 0854  NA 133* 132* 130* 132* 133*  K 4.2 4.3 4.9 4.6 4.1   CL 111 110 107 110 109  CO2 12* 11* 10* 13* 14*  GLUCOSE 98 87 159* 137* 93  BUN 76* 81* 84* 85* 85*  CREATININE 4.45* 4.69* 4.89* 5.14* 5.43*  CALCIUM 8.2* 8.1* 8.0* 7.9* 7.8*  MG  --   --   --  2.1  --   PHOS  --  7.4*  --   --   --    Liver Function Tests: Recent Labs  Lab 08/03/23 [phone]  08/05/23 0916  AST 19  --   ALT 27  --   ALKPHOS 145*  --   BILITOT 0.2  --   PROT 5.5*  --   ALBUMIN 2.1* 2.0*   No results for input(s): "LIPASE", "AMYLASE" in the last 168 hours. No results for input(s): "AMMONIA" in the last 168 hours. Cardiac Enzymes: No results for input(s): "CKTOTAL", "CKMB", "CKMBINDEX", "TROPONINI" in the last 168 hours. BNP (last 3 results) No results for input(s): "BNP" in the last 8760 hours.  ProBNP (last 3 results) No results for input(s): "PROBNP" in the last 8760 hours.  CBG: Recent Labs  Lab 08/05/23 1725 08/05/23 2137 08/06/23 0616 08/07/23 0444 08/08/23 0643  GLUCAP 92 148* 164* 143* 102*   Recent Results (from the past 240 hours)  Body fluid culture w Gram Stain     Status: None   Collection Time: 07/31/23  2:20 PM   Specimen: Body Fluid  Result Value Ref Range Status   Specimen Description FLUID  Final   Special Requests LEFT KNEE  Final   Gram Stain   Final    ABUNDANT WBC PRESENT, PREDOMINANTLY PMN NO ORGANISMS SEEN    Culture   Final    NO GROWTH 3 DAYS Performed at [facility], [address], [place], [place] [address]    Report Status 08/03/2023 FINAL  Final  Urine Culture (for pregnant, neutropenic or urologic patients or patients with an indwelling urinary catheter)     Status: Abnormal   Collection Time: 08/04/23  2:08 PM   Specimen: Urine, Catheterized  Result Value Ref Range Status   Specimen Description URINE, CATHETERIZED  Final   Special Requests   Final    NONE Performed at [facility], [address] [place], [place] [address]    Culture >=100,000 COLONIES/mL YEAST (A)  Final   Report Status  08/05/2023 FINAL  Final     Studies: No results found.     Joycelyn Das, MD  [facility] 08/08/2023  If 7PM-7AM, please contact night-coverage

## 2023-08-09 LAB — BASIC METABOLIC PANEL WITH GFR
Anion gap: 12 (ref 5–15)
BUN: 81 mg/dL — ABNORMAL HIGH (ref 8–23)
CO2: 13 mmol/L — ABNORMAL LOW (ref 22–32)
Calcium: 7.7 mg/dL — ABNORMAL LOW (ref 8.9–10.3)
Chloride: 109 mmol/L (ref 98–111)
Creatinine, Ser: 5.28 mg/dL — ABNORMAL HIGH (ref 0.61–1.24)
GFR, Estimated: 11 mL/min — ABNORMAL LOW (ref >60)
Glucose, Bld: 116 mg/dL — ABNORMAL HIGH (ref 70–99)
Potassium: 4 mmol/L (ref 3.5–5.1)
Sodium: 134 mmol/L — ABNORMAL LOW (ref 135–145)

## 2023-08-09 LAB — CBC
HCT: 30.4 % — ABNORMAL LOW (ref 39.0–52.0)
Hemoglobin: 9.4 g/dL — ABNORMAL LOW (ref 13.0–17.0)
MCH: 26.1 pg (ref 26.0–34.0)
MCHC: 30.9 g/dL (ref 30.0–36.0)
MCV: 84.4 fL (ref 80.0–100.0)
Platelets: 309 10*3/uL (ref 150–400)
RBC: 3.6 MIL/uL — ABNORMAL LOW (ref 4.22–5.81)
RDW: 16.8 % — ABNORMAL HIGH (ref 11.5–15.5)
WBC: 58.2 10*3/uL (ref 4.0–10.5)
nRBC: 0.1 % (ref 0.0–0.2)

## 2023-08-09 LAB — GLUCOSE, CAPILLARY: Glucose-Capillary: 93 mg/dL (ref 70–99)

## 2023-08-09 MED ORDER — FIDAXOMICIN 200 MG PO TABS
200.0000 mg | ORAL_TABLET | Freq: Two times a day (BID) | ORAL | Status: DC
  Administered 2023-08-09 – 2023-08-19 (×21): 200 mg via ORAL
  Filled 2023-08-09 (×21): qty 1

## 2023-08-09 MED ORDER — FUROSEMIDE 10 MG/ML IJ SOLN
60.0000 mg | Freq: Two times a day (BID) | INTRAMUSCULAR | Status: AC
  Administered 2023-08-09 (×2): 60 mg via INTRAVENOUS
  Filled 2023-08-09 (×2): qty 6

## 2023-08-09 MED ORDER — ALBUMIN HUMAN 25 % IV SOLN
25.0000 g | Freq: Two times a day (BID) | INTRAVENOUS | Status: AC
  Administered 2023-08-09 (×2): 25 g via INTRAVENOUS
  Filled 2023-08-09 (×2): qty 100

## 2023-08-09 NOTE — Progress Notes (Signed)
 ID PROGRESS NOTE   Spoke with dr Fanny Dance, concern that patient still having leukamoid reaction in setting of being treated for cdiff and PsA UTI.   Agree that it make sense to change oral vanco to fidaxomicin.  -otherwise he is clinically stable  We will see if this change makes any difference. Since patient is also on cefepime, may need to extend treatment course of cdiff. Would reassess closer to 10 day mark of fidaxomicin  Fuad Forget B. Drue Second MD MPH [facility] [phone]

## 2023-08-09 NOTE — Progress Notes (Signed)
 PROGRESS NOTE   Subjective/Complaints: Knee pain is improved. WBC came back  58.2.  He feels OK overall, has poor appetite. Still had diarrhea last night but a little improved.    ROS: Patient denies fever, new vision changes, dizziness, nausea, vomiting shortness of breath or chest pain, headache, or mood change. Diarrhea- ongoing Knee pain-left improved, right also improving Hematuria continued + fatigue + Edema     Objective:   No results found.     Recent Labs    08/08/23 0854 08/09/23 1201  WBC 44.3* PENDING  HGB 9.1* 9.4*  HCT 28.3* 30.4*  PLT 365 309     Recent Labs    08/07/23 0835 08/08/23 0854  NA 132* 133*  K 4.6 4.1  CL 110 109  CO2 13* 14*  GLUCOSE 137* 93  BUN 85* 85*  CREATININE 5.14* 5.43*  CALCIUM 7.9* 7.8*      Intake/Output Summary (Last 24 hours) at 08/09/2023 1227 Last data filed at 08/09/2023 0750 Gross per 24 hour  Intake 262.48 ml  Output 1900 ml  Net -1637.52 ml     Pressure Injury 07/10/23 Buttocks Right;Mid;Upper Stage 2 -  Partial thickness loss of dermis presenting as a shallow open injury with a red, pink wound bed without slough. Pink opened stage 2 right upper buttock (Active)  07/10/23 1608  Location: Buttocks  Location Orientation: Right;Mid;Upper  Staging: Stage 2 -  Partial thickness loss of dermis presenting as a shallow open injury with a red, pink wound bed without slough.  Wound Description (Comments): Pink opened stage 2 right upper buttock  Present on Admission: Yes     Pressure Injury 07/10/23 Buttocks Right;Mid;Upper Stage 2 -  Partial thickness loss of dermis presenting as a shallow open injury with a red, pink wound bed without slough. Pink stage 2 directly beside the right upper stage 2 (Active)  07/10/23 1609  Location: Buttocks  Location Orientation: Right;Mid;Upper  Staging: Stage 2 -  Partial thickness loss of dermis presenting as a shallow  open injury with a red, pink wound bed without slough.  Wound Description (Comments): Pink stage 2 directly beside the right upper stage 2  Present on Admission: Yes     Pressure Injury 07/10/23 Buttocks Right;Mid;Lower Stage 2 -  Partial thickness loss of dermis presenting as a shallow open injury with a red, pink wound bed without slough. Pink stage 2 to right buttock below the upper stage 2 (Active)  07/10/23 1610  Location: Buttocks  Location Orientation: Right;Mid;Lower  Staging: Stage 2 -  Partial thickness loss of dermis presenting as a shallow open injury with a red, pink wound bed without slough.  Wound Description (Comments): Pink stage 2 to right buttock below the upper stage 2  Present on Admission: Yes     Pressure Injury 07/10/23 Buttocks Left;Mid;Lower Stage 2 -  Partial thickness loss of dermis presenting as a shallow open injury with a red, pink wound bed without slough. Pink stage 2 to left buttock (Active)  07/10/23 1611  Location: Buttocks  Location Orientation: Left;Mid;Lower  Staging: Stage 2 -  Partial thickness loss of dermis presenting as a shallow open injury with a red, pink  wound bed without slough.  Wound Description (Comments): Pink stage 2 to left buttock  Present on Admission: Yes    Physical Exam: Vital Signs Blood pressure (!) 119/53, pulse 69, temperature 98.1 F (36.7 C), resp. rate 17, height 5\' 9"  (1.753 m), weight 105.1 kg, SpO2 100%.  Constitutional: No distress . Vital signs reviewed.  Sitting in bed. Obese, Appears comfortable overall  HEENT: NCAT, EOMI, oral membranes moist Neck: supple Cardiovascular: RRR Respiratory/Chest: CTA Bilaterally without wheezes or rales. Normal effort    GI/Abdomen: BS +, non-tender, non-distended Ext: no clubbing, cyanosis, + 3 + B/L LE and  2-3+ LUE edema and 2+ RUE edema   GU: Foley with blood-tinged urine continues to improve  Psych: pleasant and cooperative Skin: No evidence of breakdown, no evidence of  rash Neurologic: Cranial nerves II through XII grossly intact, motor strength is moving all 4 extremities to gravity and resistance    MSK: Bilateral knee effusions ongoing Left shoulder tight in ER/IR/F/E            Assessment/Plan: 1. Functional deficits which require 3+ hours per day of interdisciplinary therapy in a comprehensive inpatient rehab setting. Physiatrist is providing close team supervision and 24 hour management of active medical problems listed below. Physiatrist and rehab team continue to assess barriers to discharge/monitor patient progress toward functional and medical goals  Care Tool:  Bathing  Bathing activity did not occur:  (patient completed a simulated task at bed LOF) Body parts bathed by patient: Right arm, Left arm, Chest, Abdomen, Front perineal area, Right upper leg, Left upper leg, Face   Body parts bathed by helper: Buttocks, Left lower leg, Right lower leg     Bathing assist Assist Level: Maximal Assistance - Patient 24 - 49%     Upper Body Dressing/Undressing Upper body dressing   What is the patient wearing?: Pull over shirt    Upper body assist Assist Level: Contact Guard/Touching assist    Lower Body Dressing/Undressing Lower body dressing      What is the patient wearing?: Pants, Underwear/pull up, Incontinence brief     Lower body assist Assist for lower body dressing: Maximal Assistance - Patient 25 - 49%     Toileting Toileting    Toileting assist Assist for toileting: 2 Helpers     Transfers Chair/bed transfer  Transfers assist  Chair/bed transfer activity did not occur: Safety/medical concerns (not safe to get up)  Chair/bed transfer assist level: Maximal Assistance - Patient 25 - 49%     Locomotion Ambulation   Ambulation assist   Ambulation activity did not occur: Safety/medical concerns  Assist level: Dependent - Patient 0% Assistive device: Other (comment) (sara plus) Max distance: 18 steps   Walk 10  feet activity   Assist  Walk 10 feet activity did not occur: Safety/medical concerns        Walk 50 feet activity   Assist Walk 50 feet with 2 turns activity did not occur: Safety/medical concerns         Walk 150 feet activity   Assist Walk 150 feet activity did not occur: Safety/medical concerns         Walk 10 feet on uneven surface  activity   Assist Walk 10 feet on uneven surfaces activity did not occur: Safety/medical concerns         Wheelchair     Assist Is the patient using a wheelchair?: Yes Type of Wheelchair: Power    Wheelchair assist level: Supervision/Verbal cueing Max wheelchair  distance: 300+    Wheelchair 50 feet with 2 turns activity    Assist        Assist Level: Supervision/Verbal cueing   Wheelchair 150 feet activity     Assist      Assist Level: Supervision/Verbal cueing   Blood pressure (!) 119/53, pulse 69, temperature 98.1 F (36.7 C), resp. rate 17, height 5\' 9"  (1.753 m), weight 105.1 kg, SpO2 100%.   Medical Problem List and Plan: 1. Functional deficits secondary to debility related to septic shock/C. difficile colitis/AKI superimposed on CKD stage IV             -patient may shower with tubes/drains covered             -ELOS/Goals:  3/28, min assist PT/OT             -Continue CIR therapies including PT, OT   -Team meeting scheduled for Tuesday  -Discussed with plan with patient and wife on rounds and then called wife later this morning for continued discussion.  Will plan team meeting for Tuesday at 1 PM.  -Daily therapy for now   2.  Antithrombotics: -DVT/anticoagulation:  Pharmaceutical: Eliquis             -antiplatelet therapy: N/A   3. Pain Management: tramadol prn, Zanaflex 4 mg twice daily as needed   Biliateral OA of knees  -ordered bilateral neoprine knee sleeves --try today  -voltaren gel tid to knees  -have scheduled tramadol 50mg  at 0700 and 1200 daily. Continue prn as well  Mild  adhesive capsulitis left shoulder---  sports cream. Can use voltaren gel also. Aggressive ROM with therapy and while in bed--continue  -Will check right knee x-ray, left knee x-ray on 3/8 with advanced OA.  Will consult orthopedics for cortisone injections  -3-28: Orthopedics aspirated and injected left knee, with improvement.  Ongoing pain with right knee.  3/31 Knee fluid calcium pyrophosphate cyrystals, pseudogout?, cultures with no growth  -4/2 hydrocodone PRN started yesterday for flank pain  4/3 reconsult orthopedics, will come up and talk to him today about his right knee.  I had spoken with orthopedics on 4/1 are considering oral steroids due to crystals, pseudogout?  But held off due to more acute medical issues  4/4 right knee is feeling better continue to monitor  4/5 continues to be improved although has not had therapy today  4/6 reports knee pain is better    4. Mood/Behavior/Sleep: Trazodone 50 mg nightly as needed.  Provide emotional support             -antipsychotic agents: N/A   5. Neuropsych/cognition: This patient is capable of making decisions on his own behalf.   6. Skin/Wound Care: Routine skin checks   7. Fluids/Electrolytes/Nutrition:    -3/14 po intake remains poor, albumin low, weights falling  -will ask RD for assistance. Have spoken to patient about intake  -?IV albumin (see below)  3/18 discussed with dietitian, patient recorded is eating 0% of his meals.  Appears that wife bringing in 3 meals a day and he is eating about 75%, appears to have fairly adequate intake.  3/27 eating frequent outside foods-doesn't like hospital food as much   8.  Oliguric AKI superimposed on CKD stage IV.  Baseline creatinine 4.9.  CRRT discontinued 2/25.  No current plan for long-term dialysis.  Follow-up renal services              - Per discussion with nephrology Dr. Verna Czech, keep  Foley catheter in to allow accurate I's and O's through Monday; then, can remove and do DC Foley  trial while continuing to document strict outputs             - Continue daily assessments of renal function per nephrology   3/8- per renal, Cr leveling out- and con't Foley- will remove Monday  -nephrology following, Cr remains in 4's.   3/14 renal function with some improvement today     -suspect lower extremity edema is related to renal function/low albumin   Appreciate nephro input   -3/17 BUN/creatinine slightly improved 34/3.31, appreciate nephrology assistance  3/19 BUN/CR stable at 34/3.24, nephrology signing off, ok to check BMET twice a week  3/23 encouraging po this weekend. happy that he's eating food from home  3/24 BUN/CR overall stable at 40/3.26  -3/31 BUN/Cr a little higher today, IVF 34ml/hr  -4/1 Cr a little higher again, will contact nephrology  -4/3 nephrology note reviewed, IV Lasix discontinued and he was given some Lasix today, changed oral bicarb by nephrology, can give IV as needed  4/4 patient has a lot of edema, nephrology reducing Lasix today and holding IVF creatinine higher at 5.14  4/5 reviewed nephrology note, patient getting albumin and Lasix today  5/6 labs pending today- have been drawn, discussed with nephrology   9.  C. difficile colitis.  Completed course of oral vancomycin 3/7.  DC'd IV Flagyl 3/3. Enteric precautions   3/14 stools formed this morning   3/17 discussed with ID pharmacy, patient having more frequent bowel movements.  Monitor today and if continues consider Dificid treatment  3/18 diarrhea appears to be improving, continue to monitor for now  3/19 Frequent Bms improved, continue to follow  3/21 having more frequent bowel movements again today, continue to monitor and if frequent diarrhea persists this weekend consider treatment with Dificid  3/23- mushy/lqiuid stool again overnight    -no dificid at this point. Discuss with GI first on Monday    -added fiber to regimen yesterday, will increase to bid today  3/24 mushy/ liquid BMs,  about 2 a day yesterday, more other days- will discuss GI  3/25 GI recommended repeat C. difficile screening. Discussed with Jerol[facility], hold off on repeat screening test, he only had about 2 bowel movements a day but if continues to have frequent BMs consider retreatment with oral vancomycin for 10 days  3/26 pt reports Bms more firm, continue to monitor   3/28 patient reports BMs have been more solid, continue to monitor.  If worsening diarrhea occurs consider retreatment with vancomycin oral for 10 days  3-29: Diarrhea this a.m., no bowel movements this afternoon recorded  3-30: No BM since yesterday.  3-31 diarrhea continues to be improved overall  4-3 LBM yesterday, reports diarrhea continues to be improved  -4/4 patient with increased diarrhea, discussed with hospitalist and ID.  Restarted oral vancomycin 125 mg 4 times a day for 10 days  -4/5 continue vancomycin oral, hospitalist following appreciate assistance  -4/6 discussed with ID today, will change to Dificid  10.  Status post left nephrostomy tube d/t staghorn calculi. Has Foley Nephrostomy tube placed 1/29 per IR with plan for ureteric stent placement 3/21              - Careful monitoring of nephrostomy output  -3/14 flomax has been added to improve emptying  -3/21 patient scheduled today for cystoscopy with left retrograde pyelogram, left ureteroscopy laser lithotripsy and left ureteral stent placement.  3/22- procedure appears successful thus far.    -urine clear, patient comfortable   -foley to come out on Monday per urology  3/25 Foley was removed patient continues to have urinary retention requiring IC.  Increase Flomax to 0.8 mg  3/26 intermittent blood noted with IC, if continues will restart foley  3/27 Urine no longer blood tinged, continue current regimen, consider restart foley if reoccurs  3/27 continues to require catheterization but no longer blood-tinged Addendum 3/28 foley started after traumatic cath,  possible false passage, continue foley for now, f/u with urology outpatient 4/1 foley with hematuria, denies known trauma to this, will ask nursing to check bladder scan. U/A culture ordered.  Called his urologist to discuss 4/2 CT abdomen/pelvis-bilateral obstructive uropathy with collecting system hematuria, large calculus in right ureter and pelvic inlet, new left hydronephrosis, new anasarca, liver nodules raising possibility of cirrhosis.  Discussed patient with urology - surgical procedure today  Addendum discussed with urology, will hold eliquis for now due to bleeding, check EKG- A flutter -4/3 cystoscopy, right retrograde pyelogram, right ureteral stent insertion yesterday by Dr. Pete Glatter, further procedure scheduled on 4/15 with Dr. Mena Goes.  Discussed with [facility] urology.  Do not think traumatic cath last week caused recurrent bleeding his kidney, his wife was concerned about this.  Patient was having nonbloody urine for several days on the Foley.  Will continue Foley and not attempt intermittent cath.  Patient suspected to have false passage. 4/4 urine appears less dark today, urology considering TXA tomorrow if bleeding is not resolved, Eliquis on hold 4/5 appreciate urology assistance, bleeding does appear to be improving, only blood-tinged urine in Foley tubing.  Consider restart Eliquis when 4/6 Discussed with urology,  bleeding improving, hold off on TXA, continue to hold eliquis for today due to high risk of bleeding   11.  Chronic atrial fibrillation with episodes of bradycardia.  Followed by cardiology services.  Continue Eliquis.  Avoid AV nodal blocking agents   12.  Diabetes mellitus.  Hemoglobin A1c 6.2.    -tightly controlled. Dc SSI and change cbg checks to qam only   CBG (last 3)  Recent Labs    08/08/23 0643 08/08/23 2045 08/09/23 0618  GLUCAP 102* 100* 93       3-30: Recently increased.  Somewhat expected with intra-articular steroid injection.   Continue to trend.  4/5-6 controlled, continue to monitor  13.  Hypotension.  ProAmatine 10 mg 3 times daily.  Monitor with increased mobility   - Normotensive on admission  3/9- BP running 100-s to 120s systolic- con't regimen  3/19 BP has been on high side, will change midodrine to 5mg  TID PRN orthostatic hypotension  3/28 BP stable, continue current regimen  -4/3-6 BP continue current regimen and monitor     08/09/2023   11:35 AM 08/09/2023    4:39 AM 08/09/2023    4:23 AM  Vitals with BMI  Weight  231 lbs 11 oz   BMI  34.2   Systolic 119  132  Diastolic 53  62  Pulse 69  68    14.  Chronic anemia.  Niferex daily/Aranesp.  Monitor for any bleeding episodes   3/8- Hb 7.1 today- will order transfusion- transfusion of 1 unit pRBCs.   3/14 hgb up to 9.1  -3/17 hemoglobin down to 8.4, continue to monitor  -3/19 HGB 8.3 today, stable overall, continue to monitor    -3/27 HGB 7.7- a little lower likely due to GU procedure/hematuria, continue to  monitor  3/31 HGB 7.6 today, transfuse 7 or less  4/3 hemoglobin stable today after transfusion yesterday  -HGB stable   16. Leukocytosis  3/9- Pt's WBC is 21.7- up from 16k- is afebrile- due to his recent C diff, that just finished correct dosing of ABX for, called ID for guidance on treatment- don't want to cause more Cdiff- ID consulted ---no new orders  3/10 WBC's down to 17k  --have been hovering above and below 20 for awhile. No new clinical symptoms. Nephrostomy drainage clear, yellow  3/14 wbc's down to 13k.--recheck Monday   3/17 WBC is down to 12.4  3/19 WBC 13.4 today, continue to monitor  3/24 WBC higher today 18.7, Discuss C diff treatment with GI a above 3/31 WBC still elevated 19.0, monitor for signs of infection 4/3 WBC still elevated to 23.8, potentially due to his urological issues and procedure.  Hospitalist also following appreciate assistance  4/4 WBC higher today, oral vancomycin started as above.  Discussed with ID Patrecia Pace and hospitalist.  Patient had Pseudomonas resulting in prior urine culture.  Recommended cefepime for approximately 10 days depending on WBC.  Called pharmacy for cefepime dosing.  ID recommended replacing Foley due to yeast on last culture, discussed with urology who indicates this was already done so hold off on changing again.  4/5 continue current antibiotic regimen, hospitalist team following.  WBC higher today-discussed with hospitalist yesterday, often see higher WBCs with C. difficile.  Continue monitor trend daily.  Patient remains afebrile and vital stable  4/6 Called discussed with ID assistance will change to Dificid  17. Decreased mood reported by therapy  -Consider SSRI  -3/20 patient reports his mood is okay, denies depression this morning.  Continue to monitor  18. Insomnia   -3/20 schedule trazodone and increase to 75mg  at bedtime  -3/21 insomnia improved but bowel movements at night did wake him up a few times  3/23 sleeping better when stooling isn't an issue  19. LE edema  -Check albumin/cmp with next labs. Leg elevation and compression   -Significantly improved on the left after steroid injection and effusion tap.  -IVF discontinued as above  - Nephrology following, treating with Lasix, albumin  -4/6 edema appears to be improved today  LOS: 30 days A FACE TO FACE EVALUATION WAS PERFORMED  Fanny Dance 08/09/2023, 12:27 PM

## 2023-08-09 NOTE — Progress Notes (Signed)
 [facility] Progress Note   Subjective:  Back pain remains resolved.  UOP 2.71mL with IV lasix 60 IV x 2 + albumin - net neg 2.2L yest.     No uremic symptoms.  Appetite ok now - eating/drinking.  Labs ordered but not done yet.  Objective Vitals:   08/08/23 1641 08/08/23 2049 08/09/23 0423 08/09/23 0439  BP: (!) 113/56 121/68 132/62   Pulse:  67 68   Resp:  17 18   Temp:  98.1 F (36.7 C) 98.1 F (36.7 C)   TempSrc:      SpO2:  99% 99%   Weight:    105.1 kg  Height:       Physical Exam General: lying flat, comfortable lying still Heart: RRR Lungs: clear Abdomen: soft Extremities: 3+ L tibial edema 3+ R tibial, LUE 3+ RUE 2+ --> slightly improved GU:  foley draining blood tinged urine, no clots --> much better  Additional Objective Labs: Basic Metabolic Panel: Recent Labs  Lab 08/05/23 0916 08/06/23 0607 08/07/23 0835 08/08/23 0854  NA 132* 130* 132* 133*  K 4.3 4.9 4.6 4.1  CL 110 107 110 109  CO2 11* 10* 13* 14*  GLUCOSE 87 159* 137* 93  BUN 81* 84* 85* 85*  CREATININE 4.69* 4.89* 5.14* 5.43*  CALCIUM 8.1* 8.0* 7.9* 7.8*  PHOS 7.4*  --   --   --    Liver Function Tests: Recent Labs  Lab 08/03/23 0521 08/05/23 0916  AST 19  --   ALT 27  --   ALKPHOS 145*  --   BILITOT 0.2  --   PROT 5.5*  --   ALBUMIN 2.1* 2.0*   No results for input(s): "LIPASE", "AMYLASE" in the last 168 hours. CBC: Recent Labs  Lab 08/04/23 1939 08/05/23 0527 08/06/23 0607 08/07/23 0835 08/08/23 0854  WBC 19.1* 24.4* 23.8* 35.3* 44.3*  HGB 7.7* 7.3* 8.2* 8.6* 9.1*  HCT 25.2* 23.8* 26.3* 27.4* 28.3*  MCV 86.6 87.5 85.1 84.6 83.7  PLT 350 372 341 361 365   Blood Culture    Component Value Date/Time   SDES URINE, CATHETERIZED 08/04/2023 1408   SPECREQUEST  08/04/2023 1408    NONE Performed at [facility], [address] [place], [place] [address]    CULT >=100,000 COLONIES/mL YEAST (A) 08/04/2023 1408   REPTSTATUS 08/05/2023 FINAL 08/04/2023  1408    Cardiac Enzymes: No results for input(s): "CKTOTAL", "CKMB", "CKMBINDEX", "TROPONINI" in the last 168 hours. CBG: Recent Labs  Lab 08/06/23 0616 08/07/23 0444 08/08/23 0643 08/08/23 2045 08/09/23 0618  GLUCAP 164* 143* 102* 100* 93   Iron Studies: No results for input(s): "IRON", "TIBC", "TRANSFERRIN", "FERRITIN" in the last 72 hours. @lablastinr3 @ Studies/Results: No results found.  Medications:  sodium chloride 10 mL/hr at 08/08/23 2247   ceFEPime (MAXIPIME) IV Stopped (08/08/23 1711)      Chlorhexidine Gluconate Cloth  6 each Topical BID   darbepoetin (ARANESP) injection - NON-DIALYSIS  40 mcg Subcutaneous Q Fri-1800   diclofenac Sodium  2 g Topical QID   iron polysaccharides  150 mg Oral Daily   multivitamin  1 tablet Oral QHS   Muscle Rub   Topical TID   psyllium  1 packet Oral BID   sodium bicarbonate  1,300 mg Oral TID   tamsulosin  0.8 mg Oral QPC supper   traMADol  50 mg Oral BID   traZODone  75 mg Oral QHS   vancomycin  125 mg Oral QID  Assessment/Plan:73yo M A fib, DM, A fib on eliquis, nephrolithiasis s/p recent PCN for staghorn calculus currently admitted to CIR for debility and nephrology is reconsulted for AKI on CKD.  **AKI on CKD 4:   Baseline kidney function 05/2023 Cr 4.1 - 5.3 f/b CKA.  After CRRT stopped his creatinine improved to nadir of 3.2 on 3/19 and has gradually trended up 3/27 3.6 > 3/31 4.2 > 4/1 4.45 .  Found to have obstructing stone on R s/p OR with ureteral stent 4/2.  Cr trending up further 5.14 > 5.4 4/6 - pending still today.  Remains nonoliguric with no uremic symptoms; no RRT needed at this time but discussed need may arise in coming days.  Give albumin 25g x 2 and lasix 60 x 2 again today and holding IVF.  Daily labs for now.  Strict I/Os.  Avoid hypotension and nephrotoxins.    **Anemia, normocytic:  Transfuse as needed for Hb < 7.  Having acute blood loss through foley but H/H stable.  Eliquis has been held.  On aranesp.    **Metabolic acidosis:  worsening in context of AKI; in light of worsening overload d/cd bicarb gtt and switched to oral. Serum bicarb slowly improving.   **A fib:  eliquis on hold for acute bleeding  **Orthostatic hypotension:  has been on midodrine for the last few weeks, BPs look ok currently.   **Debility:  on CIR  Will follow closely with you, please reach out with concerns.   Estill Bakes MD 08/09/2023, 8:56 AM  [facility] Pager: [phone]

## 2023-08-09 NOTE — Progress Notes (Signed)
 PROGRESS NOTE  Calvin Schultz ZOX:[id number] DOB: June 09, 1950 DOA: 07/10/2023 PCP: Gwenlyn Found, MD   LOS: 30 days   Brief narrative:   Calvin Schultz is a 73 y.o. male with past medical history of atrial fibrillation on Eliquis, hypertension, diabetes mellitus type 2, CKD who was recently admitted hospital for acute renal failure and left-sided hydronephrosis secondary to staghorn calculus with left UPC stricture underwent nephrostomy placement.  Rehospitalization from 2/21-3/7 at [facility] with concern for septic shock initially on norepinephrine drip related to colitis of the descending colon. C. difficile antigen and toxin positive.  Patient was noted to have AKI superimposed on CKD stage IV for which nephrology was consulted and patient was placed on CRRT.  Patient was subsequently transferred to inpatient rehab.    Patient had been complaining of left flank pain cough with productive sputum.    A CT scan of the abdomen pelvis was obtained which noted bilateral acute obstructive uropathy with evidence of bilateral collecting system hematuria with large 7- 8 mm diameter obstructing calculus of the right ureter at the pelvic inlet and new left hydronephrosis despite normally positioned double-J ureteral stent, bladder wall inflammation, anasarca, trace pelvic ascites, nodular liver contour giving concern for cirrhosis.  At this time patient has been consulted for medical management of issues.   Assessment/Plan: Principal Problem:   Right ureteral calculus Active Problems:   Acute kidney injury superimposed on chronic kidney disease (HCC)   Normocytic anemia   Chronic atrial fibrillation (HCC)   Debility   Metabolic acidosis   Pressure injury of skin   Leukocytosis   History of Clostridium difficile colitis   Controlled diabetes mellitus type 2 with complications (HCC)   Acute cystitis with hematuria   C. difficile diarrhea   Obstructive uropathy Left AKI on CKD stage IV Metabolic  acidosis  Latest creatinine at 5.4 from 5.1. Patient had previously been on CRRT which was stopped on 3/19.    CT scan of the abdomen noted bilateral acute obstructive uropathy with evidence of bilateral collecting system hematuria with large 7- 8 mm diameter obstructing calculus of the right ureter at the pelvic inlet and new left hydronephrosis despite normally positioned double-J ureteral stent.  Urology was consulted and patient underwent cystoscopy with right retrograde pyelogram and insertion of right ureteral stent on 08/05/2023.  Patient did have staged treatment with Dr. Mena Goes for left obstructive ureteral stone on 07/24/2023.  Nephrology following.  Received albumin and Lasix.   Leukocytosis C. difficile diarrhea.  Still continues to have multiple episodes of diarrhea. Acute on chronic.  White blood cell count up to 44.3   Has history of C. difficile colitis in the past and has been having diarrhea so treated for C. difficile again with oral vancomycin.  Urine culture showing yeast and Pseudomonas as well.  Provider had spoken with ID and recommendation is  on cefepime.  Check CBC in AM.  Hematuria. On Foley catheter.  Urology on board.  Status post ureteric stent placement .  Off anticoagulation.  Normocytic anemia Hemoglobin latest at 9.1 from 8.2.  Received 1 unit of packed RBC during hospitalization.  Continue to monitor.  Monitor periodically.   Atrial fibrillation/flutter Had hematuria so Eliquis on hold.  Rate controlled at this time.   History of C diff colitis Has been started on oral vancomycin due to ongoing diarrhea.   Controlled diabetes mellitus type 2  Hemoglobin A1c of 6.2 on 06/2023.  Continue sliding scale insulin Accu-Cheks.  Orthostatic hypotension -Continue  midodrine prn   Debility, deconditioning. Undergoing rehabilitation.   Pressure injury right buttocks stage II, left buttocks stage II.  Present on admission.  Continue wound care. Pressure Injury  07/10/23 Buttocks Right;Mid;Upper Stage 2 -  Partial thickness loss of dermis presenting as a shallow open injury with a red, pink wound bed without slough. Pink opened stage 2 right upper buttock (Active)  07/10/23 1608  Location: Buttocks  Location Orientation: Right;Mid;Upper  Staging: Stage 2 -  Partial thickness loss of dermis presenting as a shallow open injury with a red, pink wound bed without slough.  Wound Description (Comments): Pink opened stage 2 right upper buttock  Present on Admission: Yes     Pressure Injury 07/10/23 Buttocks Right;Mid;Upper Stage 2 -  Partial thickness loss of dermis presenting as a shallow open injury with a red, pink wound bed without slough. Pink stage 2 directly beside the right upper stage 2 (Active)  07/10/23 1609  Location: Buttocks  Location Orientation: Right;Mid;Upper  Staging: Stage 2 -  Partial thickness loss of dermis presenting as a shallow open injury with a red, pink wound bed without slough.  Wound Description (Comments): Pink stage 2 directly beside the right upper stage 2  Present on Admission: Yes     Pressure Injury 07/10/23 Buttocks Right;Mid;Lower Stage 2 -  Partial thickness loss of dermis presenting as a shallow open injury with a red, pink wound bed without slough. Pink stage 2 to right buttock below the upper stage 2 (Active)  07/10/23 1610  Location: Buttocks  Location Orientation: Right;Mid;Lower  Staging: Stage 2 -  Partial thickness loss of dermis presenting as a shallow open injury with a red, pink wound bed without slough.  Wound Description (Comments): Pink stage 2 to right buttock below the upper stage 2  Present on Admission: Yes     Pressure Injury 07/10/23 Buttocks Left;Mid;Lower Stage 2 -  Partial thickness loss of dermis presenting as a shallow open injury with a red, pink wound bed without slough. Pink stage 2 to left buttock (Active)  07/10/23 1611  Location: Buttocks  Location Orientation: Left;Mid;Lower   Staging: Stage 2 -  Partial thickness loss of dermis presenting as a shallow open injury with a red, pink wound bed without slough.  Wound Description (Comments): Pink stage 2 to left buttock  Present on Admission: Yes      DVT prophylaxis: Place TED hose Start: 07/17/23 9811   Disposition: currently at CIR   Code Status:     Code Status: Full Code  Family Communication: Spoke with the patient's wife at bedside on 08/08/2023  Consultants: Urology  Procedures: PRBC transfusion Cystoscopy and double-J ureteric stent placement on the right on 08/05/2023  Anti-infectives:  Cefepime IV Oral vancomycin  Anti-infectives (From admission, onward)    Start     Dose/Rate Route Frequency Ordered Stop   08/07/23 1600  ceFEPIme (MAXIPIME) 2 g in sodium chloride 0.9 % 100 mL IVPB        2 g 200 mL/hr over 30 Minutes Intravenous Every 24 hours 08/07/23 1432 08/17/23 1559   08/07/23 1200  vancomycin (VANCOCIN) capsule 125 mg        125 mg Oral 4 times daily 08/07/23 0946 08/17/23 0759   08/07/23 0930  vancomycin (VANCOCIN) 50 mg/mL oral solution SOLN 125 mg  Status:  Discontinued        125 mg Per Tube 4 times daily 08/07/23 0908 08/07/23 0946   08/05/23 1315  cefTRIAXone (ROCEPHIN) 2 g in  sodium chloride 0.9 % 100 mL IVPB        2 g 200 mL/hr over 30 Minutes Intravenous  Once 08/05/23 1308 08/05/23 1620   08/05/23 0800  cephALEXin (KEFLEX) capsule 250 mg  Status:  Discontinued        250 mg Oral Daily 08/04/23 1916 08/07/23 1432   08/05/23 0000  cephALEXin (KEFLEX) 250 MG capsule        250 mg Oral Daily 08/05/23 0657     07/24/23 0600  cefTRIAXone (ROCEPHIN) 2 g in sodium chloride 0.9 % 100 mL IVPB        2 g 200 mL/hr over 30 Minutes Intravenous 30 min pre-op 07/23/23 1600 07/24/23 0701   07/20/23 0800  cephALEXin (KEFLEX) capsule 250 mg        250 mg Oral 2 times daily 07/17/23 0938 07/25/23 0759       Subjective: Today, patient was seen and examined at bedside.  Patient  states that he still has hemodialysis today.  Tolerated ER.  Has a decreased appetite.  Denies overt abdominal pain but mild discomfort.    Objective: Vitals:   08/08/23 2049 08/09/23 0423  BP: 121/68 132/62  Pulse: 67 68  Resp: 17 18  Temp: 98.1 F (36.7 C) 98.1 F (36.7 C)  SpO2: 99% 99%    Intake/Output Summary (Last 24 hours) at 08/09/2023 1051 Last data filed at 08/09/2023 0750 Gross per 24 hour  Intake 262.48 ml  Output 1900 ml  Net -1637.52 ml   Filed Weights   08/07/23 0444 08/08/23 0500 08/09/23 0439  Weight: 105.2 kg 105.2 kg 105.1 kg   Body mass index is 34.22 kg/m.   Physical Exam:  GENERAL: Patient is alert awake and mildly Communicative, not in obvious distress.  Obese. HENT: No scleral pallor or icterus. Pupils equally reactive to light. Oral mucosa is moist NECK: is supple, no gross swelling noted. CHEST:  Diminished breath sounds bilaterally. CVS: S1 and S2 heard, no murmur.  Irregular rhythm. ABDOMEN: Soft, nonspecific tenderness on palpation.  Bowel sounds are present.  Foley catheter in place  EXTREMITIES: Bilateral lower extremity pitting edema noted. CNS: Cranial nerves are intact. No focal motor deficits. SKIN: warm and dry, pressure injury bilateral buttocks, present on admission.  Data Review: I have personally reviewed the following laboratory data and studies,  CBC: Recent Labs  Lab 08/04/23 1939 08/05/23 0527 08/06/23 0607 08/07/23 0835 08/08/23 0854  WBC 19.1* 24.4* 23.8* 35.3* 44.3*  HGB 7.7* 7.3* 8.2* 8.6* 9.1*  HCT 25.2* 23.8* 26.3* 27.4* 28.3*  MCV 86.6 87.5 85.1 84.6 83.7  PLT 350 372 341 361 365   Basic Metabolic Panel: Recent Labs  Lab 08/04/23 0531 08/05/23 0916 08/06/23 0607 08/07/23 0835 08/08/23 0854  NA 133* 132* 130* 132* 133*  K 4.2 4.3 4.9 4.6 4.1  CL 111 110 107 110 109  CO2 12* 11* 10* 13* 14*  GLUCOSE 98 87 159* 137* 93  BUN 76* 81* 84* 85* 85*  CREATININE 4.45* 4.69* 4.89* 5.14* 5.43*  CALCIUM 8.2* 8.1*  8.0* 7.9* 7.8*  MG  --   --   --  2.1  --   PHOS  --  7.4*  --   --   --    Liver Function Tests: Recent Labs  Lab 08/03/23 0521 08/05/23 0916  AST 19  --   ALT 27  --   ALKPHOS 145*  --   BILITOT 0.2  --   PROT 5.5*  --  ALBUMIN 2.1* 2.0*   No results for input(s): "LIPASE", "AMYLASE" in the last 168 hours. No results for input(s): "AMMONIA" in the last 168 hours. Cardiac Enzymes: No results for input(s): "CKTOTAL", "CKMB", "CKMBINDEX", "TROPONINI" in the last 168 hours. BNP (last 3 results) No results for input(s): "BNP" in the last 8760 hours.  ProBNP (last 3 results) No results for input(s): "PROBNP" in the last 8760 hours.  CBG: Recent Labs  Lab 08/06/23 0616 08/07/23 0444 08/08/23 0643 08/08/23 2045 08/09/23 0618  GLUCAP 164* 143* 102* 100* 93   Recent Results (from the past 240 hours)  Body fluid culture w Gram Stain     Status: None   Collection Time: 07/31/23  2:20 PM   Specimen: Body Fluid  Result Value Ref Range Status   Specimen Description FLUID  Final   Special Requests LEFT KNEE  Final   Gram Stain   Final    ABUNDANT WBC PRESENT, PREDOMINANTLY PMN NO ORGANISMS SEEN    Culture   Final    NO GROWTH 3 DAYS Performed at [facility], [address], [place], [place] [address]    Report Status 08/03/2023 FINAL  Final  Urine Culture (for pregnant, neutropenic or urologic patients or patients with an indwelling urinary catheter)     Status: Abnormal   Collection Time: 08/04/23  2:08 PM   Specimen: Urine, Catheterized  Result Value Ref Range Status   Specimen Description URINE, CATHETERIZED  Final   Special Requests   Final    NONE Performed at [facility], [address], [place], [place] [address]    Culture >=100,000 COLONIES/mL YEAST (A)  Final   Report Status 08/05/2023 FINAL  Final     Studies: No results found.     Joycelyn Das, MD  [facility] 08/09/2023  If 7PM-7AM, please contact  night-coverage

## 2023-08-10 DIAGNOSIS — F418 Other specified anxiety disorders: Secondary | ICD-10-CM

## 2023-08-10 LAB — CBC
HCT: 26.9 % — ABNORMAL LOW (ref 39.0–52.0)
Hemoglobin: 8.7 g/dL — ABNORMAL LOW (ref 13.0–17.0)
MCH: 26.9 pg (ref 26.0–34.0)
MCHC: 32.3 g/dL (ref 30.0–36.0)
MCV: 83 fL (ref 80.0–100.0)
Platelets: 310 10*3/uL (ref 150–400)
RBC: 3.24 MIL/uL — ABNORMAL LOW (ref 4.22–5.81)
RDW: 16.4 % — ABNORMAL HIGH (ref 11.5–15.5)
WBC: 53.2 10*3/uL (ref 4.0–10.5)
nRBC: 0 % (ref 0.0–0.2)

## 2023-08-10 LAB — BASIC METABOLIC PANEL WITH GFR
Anion gap: 12 (ref 5–15)
BUN: 81 mg/dL — ABNORMAL HIGH (ref 8–23)
CO2: 14 mmol/L — ABNORMAL LOW (ref 22–32)
Calcium: 7.9 mg/dL — ABNORMAL LOW (ref 8.9–10.3)
Chloride: 105 mmol/L (ref 98–111)
Creatinine, Ser: 5.39 mg/dL — ABNORMAL HIGH (ref 0.61–1.24)
GFR, Estimated: 11 mL/min — ABNORMAL LOW (ref >60)
Glucose, Bld: 96 mg/dL (ref 70–99)
Potassium: 3.5 mmol/L (ref 3.5–5.1)
Sodium: 131 mmol/L — ABNORMAL LOW (ref 135–145)

## 2023-08-10 LAB — GLUCOSE, CAPILLARY: Glucose-Capillary: 86 mg/dL (ref 70–99)

## 2023-08-10 NOTE — Plan of Care (Signed)

## 2023-08-10 NOTE — Progress Notes (Signed)
 PROGRESS NOTE  Calvin Schultz ZOX:[id number] DOB: 1951/03/05 DOA: 07/10/2023 PCP: Gwenlyn Found, MD   LOS: 31 days   Brief narrative:  Calvin Schultz is a 73 y.o. male with past medical history of atrial fibrillation on Eliquis, hypertension, diabetes mellitus type 2, CKD who was recently admitted hospital for acute renal failure and left-sided hydronephrosis secondary to staghorn calculus with left UPC stricture underwent nephrostomy placement.  Rehospitalization from 2/21-3/7 at [facility] with concern for septic shock initially on norepinephrine drip related to colitis of the descending colon. C. difficile antigen and toxin positive.  Patient was noted to have AKI superimposed on CKD stage IV for which nephrology was consulted and patient was placed on CRRT.  Patient was subsequently transferred to inpatient rehab.    Patient had been complaining of left flank pain cough with productive sputum.    A CT scan of the abdomen pelvis was obtained which noted bilateral acute obstructive uropathy with evidence of bilateral collecting system hematuria with large 7- 8 mm diameter obstructing calculus of the right ureter at the pelvic inlet and new left hydronephrosis despite normally positioned double-J ureteral stent, bladder wall inflammation, anasarca, trace pelvic ascites, nodular liver contour giving concern for cirrhosis.  At this time patient has been consulted for medical management of issues.   Assessment/Plan: Principal Problem:   Right ureteral calculus Active Problems:   Acute kidney injury superimposed on chronic kidney disease (HCC)   Normocytic anemia   Chronic atrial fibrillation (HCC)   Debility   Metabolic acidosis   Pressure injury of skin   Leukocytosis   History of Clostridium difficile colitis   Controlled diabetes mellitus type 2 with complications (HCC)   Acute cystitis with hematuria   C. difficile diarrhea   Obstructive uropathy Left AKI on CKD stage IV Metabolic  acidosis  Latest creatinine at 5.3.  Patient had previously been on CRRT which was stopped on 3/19.    CT scan of the abdomen noted bilateral acute obstructive uropathy with evidence of bilateral collecting system hematuria with large 7- 8 mm diameter obstructing calculus of the right ureter at the pelvic inlet and new left hydronephrosis despite normally positioned double-J ureteral stent.  Urology was consulted and patient underwent cystoscopy with right retrograde pyelogram and insertion of right ureteral stent on 08/05/2023.  Patient did have staged treatment with Dr. Mena Goes for left obstructive ureteral stone on 07/24/2023.  Nephrology following.  Received albumin and Lasix during hospitalization..   Leukocytosis trending up. C. difficile diarrhea.  Still continues to have multiple episodes of diarrhea.  Has been started on Dificid since yesterday has intermittent abdominal cramp.  On cefepime as well.  ID following.  WBC today at 53.2 from 44.3.  Hematuria. On Foley catheter.  Urology on board.  Status post ureteric stent placement .  Off anticoagulation.  Normocytic anemia Hemoglobin latest at 9.1 from 8.2.  Received 1 unit of packed RBC during hospitalization.  Continue to monitor.  Monitor periodically.   Atrial fibrillation/flutter Had hematuria so Eliquis on hold.  Rate controlled at this time.   History of C diff colitis Has been started on oral vancomycin due to ongoing diarrhea.   Controlled diabetes mellitus type 2  Hemoglobin A1c of 6.2 on 06/2023.  Continue sliding scale insulin Accu-Cheks.  Orthostatic hypotension -Continue midodrine prn   Debility, deconditioning. Undergoing rehabilitation.   Pressure injury right buttocks stage II, left buttocks stage II.  Present on admission.  Continue wound care. Pressure Injury 07/10/23 Buttocks Right;Mid;Upper  Stage 2 -  Partial thickness loss of dermis presenting as a shallow open injury with a red, pink wound bed without slough.  Pink opened stage 2 right upper buttock (Active)  07/10/23 1608  Location: Buttocks  Location Orientation: Right;Mid;Upper  Staging: Stage 2 -  Partial thickness loss of dermis presenting as a shallow open injury with a red, pink wound bed without slough.  Wound Description (Comments): Pink opened stage 2 right upper buttock  Present on Admission: Yes     Pressure Injury 07/10/23 Buttocks Right;Mid;Upper Stage 2 -  Partial thickness loss of dermis presenting as a shallow open injury with a red, pink wound bed without slough. Pink stage 2 directly beside the right upper stage 2 (Active)  07/10/23 1609  Location: Buttocks  Location Orientation: Right;Mid;Upper  Staging: Stage 2 -  Partial thickness loss of dermis presenting as a shallow open injury with a red, pink wound bed without slough.  Wound Description (Comments): Pink stage 2 directly beside the right upper stage 2  Present on Admission: Yes     Pressure Injury 07/10/23 Buttocks Right;Mid;Lower Stage 2 -  Partial thickness loss of dermis presenting as a shallow open injury with a red, pink wound bed without slough. Pink stage 2 to right buttock below the upper stage 2 (Active)  07/10/23 1610  Location: Buttocks  Location Orientation: Right;Mid;Lower  Staging: Stage 2 -  Partial thickness loss of dermis presenting as a shallow open injury with a red, pink wound bed without slough.  Wound Description (Comments): Pink stage 2 to right buttock below the upper stage 2  Present on Admission: Yes     Pressure Injury 07/10/23 Buttocks Left;Mid;Lower Stage 2 -  Partial thickness loss of dermis presenting as a shallow open injury with a red, pink wound bed without slough. Pink stage 2 to left buttock (Active)  07/10/23 1611  Location: Buttocks  Location Orientation: Left;Mid;Lower  Staging: Stage 2 -  Partial thickness loss of dermis presenting as a shallow open injury with a red, pink wound bed without slough.  Wound Description (Comments):  Pink stage 2 to left buttock  Present on Admission: Yes      DVT prophylaxis: Place TED hose Start: 07/17/23 1610   Disposition: currently at CIR   Code Status:     Code Status: Full Code  Family Communication: Spoke with the patient's wife at bedside on 08/10/2023  Consultants: Urology  Procedures: PRBC transfusion Cystoscopy and double-J ureteric stent placement on the right on 08/05/2023  Anti-infectives:  Cefepime IV Dificid 08/09/23  Anti-infectives (From admission, onward)    Start     Dose/Rate Route Frequency Ordered Stop   08/09/23 1330  fidaxomicin (DIFICID) tablet 200 mg        200 mg Oral 2 times daily 08/09/23 1240     08/07/23 1600  ceFEPIme (MAXIPIME) 2 g in sodium chloride 0.9 % 100 mL IVPB        2 g 200 mL/hr over 30 Minutes Intravenous Every 24 hours 08/07/23 1432 08/17/23 1559   08/07/23 1200  vancomycin (VANCOCIN) capsule 125 mg  Status:  Discontinued        125 mg Oral 4 times daily 08/07/23 0946 08/09/23 1239   08/07/23 0930  vancomycin (VANCOCIN) 50 mg/mL oral solution SOLN 125 mg  Status:  Discontinued        125 mg Per Tube 4 times daily 08/07/23 0908 08/07/23 0946   08/05/23 1315  cefTRIAXone (ROCEPHIN) 2 g in sodium chloride  0.9 % 100 mL IVPB        2 g 200 mL/hr over 30 Minutes Intravenous  Once 08/05/23 1308 08/05/23 1620   08/05/23 0800  cephALEXin (KEFLEX) capsule 250 mg  Status:  Discontinued        250 mg Oral Daily 08/04/23 1916 08/07/23 1432   08/05/23 0000  cephALEXin (KEFLEX) 250 MG capsule        250 mg Oral Daily 08/05/23 0657     07/24/23 0600  cefTRIAXone (ROCEPHIN) 2 g in sodium chloride 0.9 % 100 mL IVPB        2 g 200 mL/hr over 30 Minutes Intravenous 30 min pre-op 07/23/23 1600 07/24/23 0701   07/20/23 0800  cephALEXin (KEFLEX) capsule 250 mg        250 mg Oral 2 times daily 07/17/23 0938 07/25/23 0759       Subjective: Today, patient was seen and examined at bedside.  Complains of fatigue weakness and ongoing diarrhea.   Has mild intermittent abdominal discomfort.  Objective: Vitals:   08/09/23 2337 08/10/23 0609  BP: (!) 114/59 117/68  Pulse: 66 68  Resp: 16 16  Temp: 97.6 F (36.4 C) 98 F (36.7 C)  SpO2: 100% 100%    Intake/Output Summary (Last 24 hours) at 08/10/2023 0956 Last data filed at 08/10/2023 0900 Gross per 24 hour  Intake 597 ml  Output 1515 ml  Net -918 ml   Filed Weights   08/08/23 0500 08/09/23 0439 08/10/23 0612  Weight: 105.2 kg 105.1 kg 100.8 kg   Body mass index is 32.82 kg/m.   Physical Exam:  GENERAL: Patient is alert awake and mildly Communicative, not in obvious distress.  Obese. HENT: No scleral pallor or icterus. Pupils equally reactive to light. Oral mucosa is moist NECK: is supple, no gross swelling noted. CHEST:  Diminished breath sounds bilaterally. CVS: S1 and S2 heard, no murmur.  Irregular rhythm. ABDOMEN: Soft, nonspecific tenderness on palpation.  But no rigidity guarding or rebound tenderness.  Bowel sounds are present.  Foley catheter in place  EXTREMITIES: Bilateral lower extremity pitting edema noted. CNS: Cranial nerves are intact. No focal motor deficits. SKIN: warm and dry, pressure injury bilateral buttocks, present on admission.  Data Review: I have personally reviewed the following laboratory data and studies,  CBC: Recent Labs  Lab 08/06/23 0607 08/07/23 0835 08/08/23 0854 08/09/23 1201 08/10/23 0521  WBC 23.8* 35.3* 44.3* 58.2* 53.2*  HGB 8.2* 8.6* 9.1* 9.4* 8.7*  HCT 26.3* 27.4* 28.3* 30.4* 26.9*  MCV 85.1 84.6 83.7 84.4 83.0  PLT 341 361 365 309 310   Basic Metabolic Panel: Recent Labs  Lab 08/05/23 0916 08/06/23 0607 08/07/23 0835 08/08/23 0854 08/09/23 1201 08/10/23 0521  NA 132* 130* 132* 133* 134* 131*  K 4.3 4.9 4.6 4.1 4.0 3.5  CL 110 107 110 109 109 105  CO2 11* 10* 13* 14* 13* 14*  GLUCOSE 87 159* 137* 93 116* 96  BUN 81* 84* 85* 85* 81* 81*  CREATININE 4.69* 4.89* 5.14* 5.43* 5.28* 5.39*  CALCIUM 8.1* 8.0*  7.9* 7.8* 7.7* 7.9*  MG  --   --  2.1  --   --   --   PHOS 7.4*  --   --   --   --   --    Liver Function Tests: Recent Labs  Lab 08/05/23 0916  ALBUMIN 2.0*   No results for input(s): "LIPASE", "AMYLASE" in the last 168 hours. No results for input(s): "AMMONIA"  in the last 168 hours. Cardiac Enzymes: No results for input(s): "CKTOTAL", "CKMB", "CKMBINDEX", "TROPONINI" in the last 168 hours. BNP (last 3 results) No results for input(s): "BNP" in the last 8760 hours.  ProBNP (last 3 results) No results for input(s): "PROBNP" in the last 8760 hours.  CBG: Recent Labs  Lab 08/07/23 0444 08/08/23 0643 08/08/23 2045 08/09/23 0618 08/10/23 0611  GLUCAP 143* 102* 100* 93 86   Recent Results (from the past 240 hours)  Body fluid culture w Gram Stain     Status: None   Collection Time: 07/31/23  2:20 PM   Specimen: Body Fluid  Result Value Ref Range Status   Specimen Description FLUID  Final   Special Requests LEFT KNEE  Final   Gram Stain   Final    ABUNDANT WBC PRESENT, PREDOMINANTLY PMN NO ORGANISMS SEEN    Culture   Final    NO GROWTH 3 DAYS Performed at [facility], [address], [place], [place] [address]    Report Status 08/03/2023 FINAL  Final  Urine Culture (for pregnant, neutropenic or urologic patients or patients with an indwelling urinary catheter)     Status: Abnormal   Collection Time: 08/04/23  2:08 PM   Specimen: Urine, Catheterized  Result Value Ref Range Status   Specimen Description URINE, CATHETERIZED  Final   Special Requests   Final    NONE Performed at [facility], [address] [place], [place] [address]    Culture >=100,000 COLONIES/mL YEAST (A)  Final   Report Status 08/05/2023 FINAL  Final     Studies: No results found.     Joycelyn Das, MD  [facility] 08/10/2023  If 7PM-7AM, please contact night-coverage

## 2023-08-10 NOTE — Progress Notes (Signed)
 [facility] Progress Note   Subjective:  Cr overall not improving.  Had IV albumin yesterday. Review of record indicates no imaging since 08/05/23 Objective Vitals:   08/09/23 2156 08/09/23 2337 08/10/23 0609 08/10/23 0612  BP: 121/62 (!) 114/59 117/68   Pulse: 67 66 68   Resp: 16 16 16    Temp: (!) 97.5 F (36.4 C) 97.6 F (36.4 C) 98 F (36.7 C)   TempSrc:      SpO2: 100% 100% 100%   Weight:    100.8 kg  Height:       Physical Exam General: lying flat, comfortable lying still Heart: RRR Lungs: clear Abdomen: soft Extremities: 3+ L tibial edema 3+ R tibial, LUE 3+ RUE 2+ --> slightly improved GU:  foley draining rosy colored urine  Additional Objective Labs: Basic Metabolic Panel: Recent Labs  Lab 08/05/23 0916 08/06/23 0607 08/08/23 0854 08/09/23 1201 08/10/23 0521  NA 132*   < > 133* 134* 131*  K 4.3   < > 4.1 4.0 3.5  CL 110   < > 109 109 105  CO2 11*   < > 14* 13* 14*  GLUCOSE 87   < > 93 116* 96  BUN 81*   < > 85* 81* 81*  CREATININE 4.69*   < > 5.43* 5.28* 5.39*  CALCIUM 8.1*   < > 7.8* 7.7* 7.9*  PHOS 7.4*  --   --   --   --    < > = values in this interval not displayed.   Liver Function Tests: Recent Labs  Lab 08/05/23 0916  ALBUMIN 2.0*   No results for input(s): "LIPASE", "AMYLASE" in the last 168 hours. CBC: Recent Labs  Lab 08/06/23 0607 08/07/23 0835 08/08/23 0854 08/09/23 1201 08/10/23 0521  WBC 23.8* 35.3* 44.3* 58.2* 53.2*  HGB 8.2* 8.6* 9.1* 9.4* 8.7*  HCT 26.3* 27.4* 28.3* 30.4* 26.9*  MCV 85.1 84.6 83.7 84.4 83.0  PLT 341 361 365 309 310   Blood Culture    Component Value Date/Time   SDES URINE, CATHETERIZED 08/04/2023 1408   SPECREQUEST  08/04/2023 1408    NONE Performed at [facility], [address], [place], [place] [address]    CULT >=100,000 COLONIES/mL YEAST (A) 08/04/2023 1408   REPTSTATUS 08/05/2023 FINAL 08/04/2023 1408    Cardiac Enzymes: No results for input(s): "CKTOTAL", "CKMB",  "CKMBINDEX", "TROPONINI" in the last 168 hours. CBG: Recent Labs  Lab 08/07/23 0444 08/08/23 0643 08/08/23 2045 08/09/23 0618 08/10/23 0611  GLUCAP 143* 102* 100* 93 86   Iron Studies: No results for input(s): "IRON", "TIBC", "TRANSFERRIN", "FERRITIN" in the last 72 hours. @lablastinr3 @ Studies/Results: No results found.  Medications:  ceFEPime (MAXIPIME) IV 2 g (08/09/23 1721)      Chlorhexidine Gluconate Cloth  6 each Topical BID   darbepoetin (ARANESP) injection - NON-DIALYSIS  40 mcg Subcutaneous Q Fri-1800   diclofenac Sodium  2 g Topical QID   fidaxomicin  200 mg Oral BID   iron polysaccharides  150 mg Oral Daily   multivitamin  1 tablet Oral QHS   Muscle Rub   Topical TID   psyllium  1 packet Oral BID   sodium bicarbonate  1,300 mg Oral TID   tamsulosin  0.8 mg Oral QPC supper   traMADol  50 mg Oral BID   traZODone  75 mg Oral QHS    Assessment/Plan:73yo M A fib, DM, A fib on eliquis, nephrolithiasis s/p recent PCN for staghorn calculus currently  admitted to CIR for debility and nephrology is reconsulted for AKI on CKD.  **AKI on CKD 4:   Baseline kidney function 05/2023 Cr 4.1 - 5.3 f/b CKA.  After CRRT stopped his creatinine improved to nadir of 3.2 on 3/19 and has gradually trended up 3/27 3.6 > 3/31 4.2 > 4/1 4.45 . Had staghorn calculus on L s/p PCN. Found to have obstructing stone on R s/p OR with ureteral stent 4/2.  Cr trending up further 5.14 > 5.4 4/6 - pending still today.  Remains nonoliguric with no uremic symptoms; no RRT needed at this time but discussed need may arise in coming days.  Give albumin 25g x 2 and lasix 60 x 2 again today and holding IVF.  Daily labs for now.  Strict I/Os.  Avoid hypotension and nephrotoxins.     - will do CT scan today to rule out any other obstructive insults  - no appetite- ? Mild uremia  - may need to consider RRT in the coming days if nothing reversible identified.  **Anemia, normocytic:  Transfuse as needed for Hb < 7.   Having acute blood loss through foley but H/H stable.  Eliquis has been held.  On aranesp.   **Metabolic acidosis:  worsening in context of AKI; in light of worsening overload d/cd bicarb gtt and switched to oral. Serum bicarb slowly improving.   **A fib:  eliquis on hold for acute bleeding  **Orthostatic hypotension:  has been on midodrine for the last few weeks, BPs look ok currently.   **Debility:  on CIR  Will follow closely with you, please reach out with concerns.   Bufford Buttner MD 08/10/2023, 1:30 PM  [facility] Pager: [phone]

## 2023-08-10 NOTE — Progress Notes (Signed)
 Physical Therapy Session Note  Patient Details  Name: Calvin Schultz MRN: [id number] Date of Birth: Aug 05, 1950  Today's Date: 08/10/2023 PT Treatment Time: 1310-1408 PT Total Treatment Time: 58 min  PT Missed Time: 30 Minutes,  Missed Time Reason: Bowel/bladder accident  Short Term Goals: Week 4:  PT Short Term Goal 1 (Week 4): STG=LTG 2/2 ELOS  Skilled Therapeutic Interventions/Progress Updates:      Treatment session 1  Pt supine in bed upon arrival. Pt agreeable to therapy. Pt reports abdominal pain and bowel incontinence. Therapist attempted to assist pt with bed mobility and clean up with tech present however pt continuing to have diarrhea.   Pt missed 30 minutes 2/2 diarrhea.   Treatment Session 2   Pt supine in bed upon arrival. Pt agreeable to therapy. Pt reports 9/10 abdominal pain > B knee pain. Nurse present during session to administer pain medication.  Therapist provided rest breaks and repositioning as needed.   Pt wife present and involved throughout session. Therpaist performed active assisted ROM to L LE, and pt wife performed active assisted ROM to R LE on first set with cues and demonstration from therapist. Recommended wife assist with these exerices as tolerated when not in therapy for B LE strengthening/ROM/contracture preventions.   Pt agreeable to bed level exercise but not feeling up to sitting EOB during this session.   Pt wife reports pt B LE swelling looks better today, making it easier to donn B LE knee brace.   Pt performed the following therex for B LE strengthening/ROM/contracture prevention/edema management:    2x10 ankle pumps   2x10 quad sets holding for 5"   2x10 active assisted SLR B-pt requires more assist on R LE than L LE-pt able to initiate but needs assistance for completion within availabel range  1x10 single knee to chest B-active assisted  1x10 heel slides B active assisted  1x10 glute bridge with therapist assisting with placement of B  LE--pt demos little to no clearance but demos activation  2x10 supine glute sets holding for 5 seconds   2x10 supine hip abduction B, active assisted R LE for completion within available range of motion.   1x10 sidelying hip abduction B with heavy hip flexion compensation  Pt performed rolling B with mod progressing to heavy min A with use of bed rail and assist to place L UE on bedrail (pt requires more assist for rolling to R versus L) verbal cues provided for crossing LE and trunk rotation for improved initiation and momentum. Pt able to sustain L and R sidelying for ~1 min on each side with assist from therapist.   Discussed importance of pressure relief to reduce risk of pressure sores--pt wife reports she has been assisting with pt rolling and placing of pillows under buttocks. Therapist recommended pt wife do this every 2 hours for adequate pressure relief.    Pt supine in bed with all needs within reach and bed alarm on with B LE elevated on pillows.     Therapy Documentation Precautions:  Precautions Precautions: Fall Precaution/Restrictions Comments: Foley, bilateral knee pain and decreased ROM Restrictions Weight Bearing Restrictions Per Provider Order: No   Therapy/Group: Individual Therapy  [facility] [place], Boyd, DPT  08/10/2023, 12:11 PM

## 2023-08-10 NOTE — Progress Notes (Signed)
 Occupational Therapy Weekly Progress Note  Patient Details  Name: Calvin Schultz MRN: [id number] Date of Birth: 1950-12-09  Beginning of progress report period: August 04, 2023 End of progress report period: August 11, 2023  Session 1: {CHL IP REHAB OT TIME CALCULATIONS:304400400} Session 2:  {CHL IP REHAB OT TIME CALCULATIONS:304400400}  Patient has met {number 1-5:22450} of {number 1-5:20334} short term goals.  ***  Patient continues to demonstrate the following deficits: {impairments:3041632} and therefore will continue to benefit from skilled OT intervention to enhance overall performance with {ADL/iADL:3041649}.  Patient {LTG progression:3041653}.  {plan of MWNU:2725366}  OT Short Term Goals {OT YQI:3474259}  Skilled Therapeutic Interventions/Progress Updates:    Session 1: Patient agreeable to participate in OT session. Reports *** pain level.   Patient participated in skilled OT session focusing on ***. Therapist facilitated/assessed/developed/educated/integrated/elicited *** in order to improve/facilitate/promote   Session 2:  Patient agreeable to participate in OT session. Reports *** pain level.   Patient participated in skilled OT session focusing on ***. Therapist facilitated/assessed/developed/educated/integrated/elicited *** in order to improve/facilitate/promote    Therapy Documentation Precautions:  Precautions Precautions: Fall Precaution/Restrictions Comments: Foley, bilateral knee pain and decreased ROM Restrictions Weight Bearing Restrictions Per Provider Order: No  Therapy/Group: Individual Therapy  Limmie Patricia, OTR/L,CBIS  Supplemental OT - [facility] and [facility] Secure Chat Preferred   08/10/2023, 5:01 PM

## 2023-08-10 NOTE — Progress Notes (Signed)
   5 Days Post-Op Subjective: NAEON. Reviewed plan with Jeanclaude and his wife today. Clear yellow urine  Objective: Vital signs in last 24 hours: Temp:  [97.5 F (36.4 C)-98.7 F (37.1 C)] 98 F (36.7 C) (04/07 0609) Pulse Rate:  [66-68] 68 (04/07 0609) Resp:  [16-18] 16 (04/07 0609) BP: (114-129)/(59-68) 117/68 (04/07 0609) SpO2:  [100 %] 100 % (04/07 0609) Weight:  [100.8 kg] 100.8 kg (04/07 0612)  Assessment/Plan: #left obstructing ureteral stone S/p first step of staged treatment with Dr. Mena Goes 07/24/23  #right obstructing ureteral stone The day before discharging home (4/2) an existing right side stone obstructed and pt underwent cysto/stent placement w/ Dr. Pete Glatter.   Definitive stone mgmt with Dr. Mena Goes on 4/15.   #Hematuria-resolved  If in NSR, would hold anticoagulation until after next procedure. If pt requires anticoagulation d/t ongoing a.fib/a.flutter, could start with heparin trial.  Will need to washout for next surgery. May also consider watchman device.   Urology will follow along peripherally in the event he remains in hospital until the 15th.   Intake/Output from previous day: 04/06 0701 - 04/07 0700 In: 457 [P.O.:457] Out: 1515 [Urine:1515]  Intake/Output this shift: Total I/O In: 240 [P.O.:240] Out: 450 [Urine:450]  Physical Exam:  General: Alert and oriented CV: No cyanosis Lungs: equal chest rise Gu: foley catheter in place draining clear yellow urine  Lab Results: Recent Labs    08/08/23 0854 08/09/23 1201 08/10/23 0521  HGB 9.1* 9.4* 8.7*  HCT 28.3* 30.4* 26.9*   BMET Recent Labs    08/09/23 1201 08/10/23 0521  NA 134* 131*  K 4.0 3.5  CL 109 105  CO2 13* 14*  GLUCOSE 116* 96  BUN 81* 81*  CREATININE 5.28* 5.39*  CALCIUM 7.7* 7.9*     Studies/Results: No results found.     LOS: 31 days   Elmon Kirschner, NP [facility] Pager: [phone]  08/10/2023, 12:12 PM

## 2023-08-10 NOTE — Progress Notes (Signed)
 PROGRESS NOTE   Subjective/Complaints: C/o abdominal cramping last night, numerous stools (T6) throughout the night. Didn't sleep as a result. Very fatigued this morning. Still not eating much. Doesn't have an appetite. Right knee feeling better.   ROS: Patient denies fever, rash, sore throat, blurred vision, dizziness,  vomiting,   cough, shortness of breath or chest pain, headache, or mood change.      Objective:   No results found.     Recent Labs    08/09/23 1201 08/10/23 0521  WBC 58.2* 53.2*  HGB 9.4* 8.7*  HCT 30.4* 26.9*  PLT 309 310     Recent Labs    08/09/23 1201 08/10/23 0521  NA 134* 131*  K 4.0 3.5  CL 109 105  CO2 13* 14*  GLUCOSE 116* 96  BUN 81* 81*  CREATININE 5.28* 5.39*  CALCIUM 7.7* 7.9*      Intake/Output Summary (Last 24 hours) at 08/10/2023 0930 Last data filed at 08/09/2023 1900 Gross per 24 hour  Intake 357 ml  Output 1515 ml  Net -1158 ml     Pressure Injury 07/10/23 Buttocks Right;Mid;Upper Stage 2 -  Partial thickness loss of dermis presenting as a shallow open injury with a red, pink wound bed without slough. Pink opened stage 2 right upper buttock (Active)  07/10/23 1608  Location: Buttocks  Location Orientation: Right;Mid;Upper  Staging: Stage 2 -  Partial thickness loss of dermis presenting as a shallow open injury with a red, pink wound bed without slough.  Wound Description (Comments): Pink opened stage 2 right upper buttock  Present on Admission: Yes     Pressure Injury 07/10/23 Buttocks Right;Mid;Upper Stage 2 -  Partial thickness loss of dermis presenting as a shallow open injury with a red, pink wound bed without slough. Pink stage 2 directly beside the right upper stage 2 (Active)  07/10/23 1609  Location: Buttocks  Location Orientation: Right;Mid;Upper  Staging: Stage 2 -  Partial thickness loss of dermis presenting as a shallow open injury with a red, pink  wound bed without slough.  Wound Description (Comments): Pink stage 2 directly beside the right upper stage 2  Present on Admission: Yes     Pressure Injury 07/10/23 Buttocks Right;Mid;Lower Stage 2 -  Partial thickness loss of dermis presenting as a shallow open injury with a red, pink wound bed without slough. Pink stage 2 to right buttock below the upper stage 2 (Active)  07/10/23 1610  Location: Buttocks  Location Orientation: Right;Mid;Lower  Staging: Stage 2 -  Partial thickness loss of dermis presenting as a shallow open injury with a red, pink wound bed without slough.  Wound Description (Comments): Pink stage 2 to right buttock below the upper stage 2  Present on Admission: Yes     Pressure Injury 07/10/23 Buttocks Left;Mid;Lower Stage 2 -  Partial thickness loss of dermis presenting as a shallow open injury with a red, pink wound bed without slough. Pink stage 2 to left buttock (Active)  07/10/23 1611  Location: Buttocks  Location Orientation: Left;Mid;Lower  Staging: Stage 2 -  Partial thickness loss of dermis presenting as a shallow open injury with a red, pink wound bed without  slough.  Wound Description (Comments): Pink stage 2 to left buttock  Present on Admission: Yes    Physical Exam: Vital Signs Blood pressure 117/68, pulse 68, temperature 98 F (36.7 C), resp. rate 16, height 5\' 9"  (1.753 m), weight 100.8 kg, SpO2 100%.  Constitutional: No distress . Vital signs reviewed. HEENT: NCAT, EOMI, oral membranes moist Neck: supple Cardiovascular: RRR without murmur. No JVD    Respiratory/Chest: CTA Bilaterally without wheezes or rales. Normal effort    GI/Abdomen: BS +,  tender, non-distended, tender to palp Ext: no clubbing, cyanosis, 1-2+ LE edema Psych: pleasant and cooperative  GU: Foley with amber/yellow urine    Skin: No evidence of breakdown, no evidence of rash Neurologic: Cranial nerves II through XII grossly intact, motor strength is moving all 4  extremities to gravity and resistance    MSK: Bilateral knee effusions ongoing. Less pain with ROM Left shoulder tight in ER/IR/F/E            Assessment/Plan: 1. Functional deficits which require 3+ hours per day of interdisciplinary therapy in a comprehensive inpatient rehab setting. Physiatrist is providing close team supervision and 24 hour management of active medical problems listed below. Physiatrist and rehab team continue to assess barriers to discharge/monitor patient progress toward functional and medical goals  Care Tool:  Bathing  Bathing activity did not occur:  (patient completed a simulated task at bed LOF) Body parts bathed by patient: Right arm, Left arm, Chest, Abdomen, Front perineal area, Right upper leg, Left upper leg, Face   Body parts bathed by helper: Buttocks, Left lower leg, Right lower leg     Bathing assist Assist Level: Maximal Assistance - Patient 24 - 49%     Upper Body Dressing/Undressing Upper body dressing   What is the patient wearing?: Pull over shirt    Upper body assist Assist Level: Contact Guard/Touching assist    Lower Body Dressing/Undressing Lower body dressing      What is the patient wearing?: Pants, Underwear/pull up, Incontinence brief     Lower body assist Assist for lower body dressing: Maximal Assistance - Patient 25 - 49%     Toileting Toileting    Toileting assist Assist for toileting: 2 Helpers     Transfers Chair/bed transfer  Transfers assist  Chair/bed transfer activity did not occur: Safety/medical concerns (not safe to get up)  Chair/bed transfer assist level: Maximal Assistance - Patient 25 - 49%     Locomotion Ambulation   Ambulation assist   Ambulation activity did not occur: Safety/medical concerns  Assist level: Dependent - Patient 0% Assistive device: Other (comment) (sara plus) Max distance: 18 steps   Walk 10 feet activity   Assist  Walk 10 feet activity did not occur:  Safety/medical concerns        Walk 50 feet activity   Assist Walk 50 feet with 2 turns activity did not occur: Safety/medical concerns         Walk 150 feet activity   Assist Walk 150 feet activity did not occur: Safety/medical concerns         Walk 10 feet on uneven surface  activity   Assist Walk 10 feet on uneven surfaces activity did not occur: Safety/medical concerns         Wheelchair     Assist Is the patient using a wheelchair?: Yes Type of Wheelchair: Power    Wheelchair assist level: Supervision/Verbal cueing Max wheelchair distance: 300+    Wheelchair 50 feet with 2 turns  activity    Assist        Assist Level: Supervision/Verbal cueing   Wheelchair 150 feet activity     Assist      Assist Level: Supervision/Verbal cueing   Blood pressure 117/68, pulse 68, temperature 98 F (36.7 C), resp. rate 16, height 5\' 9"  (1.753 m), weight 100.8 kg, SpO2 100%.   Medical Problem List and Plan: 1. Functional deficits secondary to debility related to septic shock/C. difficile colitis/AKI superimposed on CKD stage IV             -patient may shower with tubes/drains covered             -ELOS/Goals:  3/28, min assist PT/OT             -Continue CIR therapies including PT, OT   -Team meeting scheduled for Tuesday  -family/ team meeting  Tuesday at 1 PM.  -Daily therapy for now   2.  Antithrombotics: -DVT/anticoagulation:  Pharmaceutical: Eliquis             -antiplatelet therapy: N/A   3. Pain Management: tramadol prn, Zanaflex 4 mg twice daily as needed   Biliateral OA of knees  -ordered bilateral neoprine knee sleeves --try today  -voltaren gel tid to knees  -have scheduled tramadol 50mg  at 0700 and 1200 daily. Continue prn as well  Mild adhesive capsulitis left shoulder---  sports cream. Can use voltaren gel also. Aggressive ROM with therapy and while in bed--continue  -Will check right knee x-ray, left knee x-ray on 3/8 with  advanced OA.  Will consult orthopedics for cortisone injections  -3-28: Orthopedics aspirated and injected left knee, with improvement.  Ongoing pain with right knee.  3/31 Knee fluid calcium pyrophosphate cyrystals, pseudogout?, cultures with no growth  -4/2 hydrocodone PRN started yesterday for flank pain  4/3 reconsult orthopedics, will come up and talk to him today about his right knee.  I had spoken with orthopedics on 4/1 are considering oral steroids due to crystals, pseudogout?  But held off due to more acute medical issues  4/4 right knee is feeling better continue to monitor  4/5 continues to be improved although has not had therapy today  4/6-7 reports knee pain is better    4. Mood/Behavior/Sleep: Trazodone 50 mg nightly as needed.  Provide emotional support             -antipsychotic agents: N/A   5. Neuropsych/cognition: This patient is capable of making decisions on his own behalf.   6. Skin/Wound Care: Routine skin checks   7. Fluids/Electrolytes/Nutrition:    -3/14 po intake remains poor, albumin low, weights falling  -will ask RD for assistance. Have spoken to patient about intake  -?IV albumin (see below)  3/18 discussed with dietitian, patient recorded is eating 0% of his meals.  Appears that wife bringing in 3 meals a day and he is eating about 75%, appears to have fairly adequate intake.  3/27 eating frequent outside foods-doesn't like hospital food as much  4/7 encouraged pt to eat any food he feels like. More important to just get calories in at this point  8.  Oliguric AKI superimposed on CKD stage IV.  Baseline creatinine 4.9.  CRRT discontinued 2/25.  No current plan for long-term dialysis.  Follow-up renal services              - Per discussion with nephrology Dr. Verna Czech, keep Foley catheter in to allow accurate I's and O's through Monday; then, can  remove and do DC Foley trial while continuing to document strict outputs             - Continue daily  assessments of renal function per nephrology   3/8- per renal, Cr leveling out- and con't Foley- will remove Monday  -nephrology following, Cr remains in 4's.   3/14 renal function with some improvement today     -suspect lower extremity edema is related to renal function/low albumin   Appreciate nephro input   -3/17 BUN/creatinine slightly improved 34/3.31, appreciate nephrology assistance  3/19 BUN/CR stable at 34/3.24, nephrology signing off, ok to check BMET twice a week  3/23 encouraging po this weekend. happy that he's eating food from home  3/24 BUN/CR overall stable at 40/3.26  -3/31 BUN/Cr a little higher today, IVF 65ml/hr  -4/1 Cr a little higher again, will contact nephrology  -4/3 nephrology note reviewed, IV Lasix discontinued and he was given some Lasix today, changed oral bicarb by nephrology, can give IV as needed  4/4 patient has a lot of edema, nephrology reducing Lasix today and holding IVF creatinine higher at 5.14  4/5 reviewed nephrology note, patient getting albumin and Lasix today  5/7 labs reviewed, similar to yesterday, ongoing mgt per nephro 9.  C. difficile colitis.  Completed course of oral vancomycin 3/7.  DC'd IV Flagyl 3/3. Enteric precautions   3/14 stools formed this morning   3/17 discussed with ID pharmacy, patient having more frequent bowel movements.  Monitor today and if continues consider Dificid treatment  3/18 diarrhea appears to be improving, continue to monitor for now  3/19 Frequent Bms improved, continue to follow  3/21 having more frequent bowel movements again today, continue to monitor and if frequent diarrhea persists this weekend consider treatment with Dificid  3/23- mushy/lqiuid stool again overnight    -no dificid at this point. Discuss with GI first on Monday    -added fiber to regimen yesterday, will increase to bid today  3/24 mushy/ liquid BMs, about 2 a day yesterday, more other days- will discuss GI  3/25 GI recommended repeat C.  difficile screening. Discussed with [facility]ol[facility], hold off on repeat screening test, he only had about 2 bowel movements a day but if continues to have frequent BMs consider retreatment with oral vancomycin for 10 days  3/26 pt reports Bms more firm, continue to monitor   3/28 patient reports BMs have been more solid, continue to monitor.  If worsening diarrhea occurs consider retreatment with vancomycin oral for 10 days  3-29: Diarrhea this a.m., no bowel movements this afternoon recorded  3-30: No BM since yesterday.  3-31 diarrhea continues to be improved overall  4-3 LBM yesterday, reports diarrhea continues to be improved  -4/4 patient with increased diarrhea, discussed with hospitalist and ID.  Restarted oral vancomycin 125 mg 4 times a day for 10 days  -4/5 continue vancomycin oral, hospitalist following appreciate assistance  -4/6 discussed with ID today, will change to Dificid  -4/7 WBC's sl improved to 53k today. Continue fidaxomicin, fiber,etc   -appreciate ID f/u 10.  Status post left nephrostomy tube d/t staghorn calculi. Has Foley Nephrostomy tube placed 1/29 per IR with plan for ureteric stent placement 3/21              - Careful monitoring of nephrostomy output  -3/14 flomax has been added to improve emptying  -3/21 patient scheduled today for cystoscopy with left retrograde pyelogram, left ureteroscopy laser lithotripsy and left ureteral stent placement.  3/22- procedure appears successful thus far.    -urine clear, patient comfortable   -foley to come out on Monday per urology  3/25 Foley was removed patient continues to have urinary retention requiring IC.  Increase Flomax to 0.8 mg  3/26 intermittent blood noted with IC, if continues will restart foley  3/27 Urine no longer blood tinged, continue current regimen, consider restart foley if reoccurs  3/27 continues to require catheterization but no longer blood-tinged Addendum 3/28 foley started after traumatic cath,  possible false passage, continue foley for now, f/u with urology outpatient 4/1 foley with hematuria, denies known trauma to this, will ask nursing to check bladder scan. U/A culture ordered.  Called his urologist to discuss 4/2 CT abdomen/pelvis-bilateral obstructive uropathy with collecting system hematuria, large calculus in right ureter and pelvic inlet, new left hydronephrosis, new anasarca, liver nodules raising possibility of cirrhosis.  Discussed patient with urology - surgical procedure today  Addendum discussed with urology, will hold eliquis for now due to bleeding, check EKG- A flutter -4/3 cystoscopy, right retrograde pyelogram, right ureteral stent insertion yesterday by Dr. Pete Glatter, further procedure scheduled on 4/15 with Dr. Mena Goes.  Discussed with [facility] urology.  Do not think traumatic cath last week caused recurrent bleeding his kidney, his wife was concerned about this.  Patient was having nonbloody urine for several days on the Foley.  Will continue Foley and not attempt intermittent cath.  Patient suspected to have false passage. 4/4 urine appears less dark today, urology considering TXA tomorrow if bleeding is not resolved, Eliquis on hold 4/5 appreciate urology assistance, bleeding does appear to be improving, only blood-tinged urine in Foley tubing.  Consider restart Eliquis when 4/6 Discussed with urology,  bleeding improving, hold off on TXA, continue to hold eliquis for today due to high risk of bleeding 4/7- urine appears to be clearing although hgb down to 8.7 today  -continue serial labs  -don' think TXA is indicated right now  -would keep eliquis on hold  11.  Chronic atrial fibrillation with episodes of bradycardia.  Followed by cardiology services.  Continue Eliquis.  Avoid AV nodal blocking agents   12.  Diabetes mellitus.  Hemoglobin A1c 6.2.    -tightly controlled. Dc SSI and change cbg checks to qam only   CBG (last 3)  Recent Labs     08/08/23 2045 08/09/23 0618 08/10/23 0611  GLUCAP 100* 93 86       3-30: Recently increased.  Somewhat expected with intra-articular steroid injection.  Continue to trend.  4/5-7 controlled, continue to monitor  13.  Hypotension.  ProAmatine 10 mg 3 times daily.  Monitor with increased mobility   - Normotensive on admission  3/9- BP running 100-s to 120s systolic- con't regimen  3/19 BP has been on high side, will change midodrine to 5mg  TID PRN orthostatic hypotension  3/28 BP stable, continue current regimen  -4/3-7 BP continue current regimen and monitor     08/10/2023    6:12 AM 08/10/2023    6:09 AM 08/09/2023   11:37 PM  Vitals with BMI  Weight 222 lbs 4 oz    BMI 32.8    Systolic  117 114  Diastolic  68 59  Pulse  68 66    14.  Chronic anemia.  Niferex daily/Aranesp.  Monitor for any bleeding episodes   3/8- Hb 7.1 today- will order transfusion- transfusion of 1 unit pRBCs.   3/14 hgb up to 9.1  -3/17 hemoglobin down to 8.4,  continue to monitor  -3/19 HGB 8.3 today, stable overall, continue to monitor    -3/27 HGB 7.7- a little lower likely due to GU procedure/hematuria, continue to monitor  3/31 HGB 7.6 today, transfuse 7 or less  4/7 hgb down to 8.7. hematuria better--continue to monitor serially   16. Leukocytosis  3/9- Pt's WBC is 21.7- up from 16k- is afebrile- due to his recent C diff, that just finished correct dosing of ABX for, called ID for guidance on treatment- don't want to cause more Cdiff- ID consulted ---no new orders  3/10 WBC's down to 17k  --have been hovering above and below 20 for awhile. No new clinical symptoms. Nephrostomy drainage clear, yellow  3/14 wbc's down to 13k.--recheck Monday   3/17 WBC is down to 12.4  3/19 WBC 13.4 today, continue to monitor  3/24 WBC higher today 18.7, Discuss C diff treatment with GI a above 3/31 WBC still elevated 19.0, monitor for signs of infection 4/3 WBC still elevated to 23.8, potentially due to his urological  issues and procedure.  Hospitalist also following appreciate assistance  4/4 WBC higher today, oral vancomycin started as above.  Discussed with ID Patrecia Pace and hospitalist.  Patient had Pseudomonas resulting in prior urine culture.  Recommended cefepime for approximately 10 days depending on WBC.  Called pharmacy for cefepime dosing.  ID recommended replacing Foley due to yeast on last culture, discussed with urology who indicates this was already done so hold off on changing again.  4/5 continue current antibiotic regimen, hospitalist team following.  WBC higher today-discussed with hospitalist yesterday, often see higher WBCs with C. difficile.  Continue monitor trend daily.  Patient remains afebrile and vital stable  4/7 WBC's 53k, sl improvement   -continue fidaxomicin   -appreciate ID help   -serial labs  17. Decreased mood reported by therapy  -Consider SSRI  -3/20 patient reports his mood is okay, denies depression this morning.  Continue to monitor  18. Insomnia   -3/20 schedule trazodone and increase to 75mg  at bedtime  -3/21 insomnia improved but bowel movements at night did wake him up a few times  3/23 sleeping better when stooling isn't an issue  19. LE edema  -Significantly improved on the left after steroid injection and effusion tap.  -IVF discontinued as above  - Nephrology following, treating with Lasix, albumin  -4/7 edema with some improvement. albumin /lasix per nephro   -elevate/compression   -improve nutrition  LOS: 31 days A FACE TO FACE EVALUATION WAS PERFORMED  Ranelle Oyster 08/10/2023, 9:30 AM

## 2023-08-10 NOTE — Consult Note (Signed)
 Neuropsychological Consultation Comprehensive Inpatient Rehab   Patient:   Calvin Schultz   DOB:   [birth year]  MR Number:  [id number]  Location:  [facility] [address] [place] [address] Dept: [phone] Loc: [phone]           Date of Service:   08/10/2023  Start Time:   9 AM End Time:   10 AM  Provider/Observer:  Arley Phenix, Psy.D.       Clinical Neuropsychologist       Billing Code/Service: [phone]  Reason for Service:    Calvin Schultz is a 73 year old male referred for neuropsychological consultation during his ongoing admission to the comprehensive inpatient rehabilitation unit.  Patient has been dealing with significant medical issues with past medical history including chronic kidney disease, diabetes, hypertension, hyperlipidemia, A-fib.  Patient was admitted in January for acute kidney injury superimposed on chronic kidney disease.  Patient has had progressive weakness and was noted to be hypotensive and recent ED visit at the end of February.  Patient has had metabolic disturbance and continued struggles with medical issues.  Patient has now been admitted down to CIR due to decreased functional mobility.  During today's clinical visit, the patient and his wife are present.  The patient's been having difficulty coping with extended hospital stay and questions around what to expect for discharge planning etc.  Patient has had worsening of depression and anxiety symptoms and continues to worry about how his wife will manage with all of his difficulties.  Patient's wife noted worsening of depressive symptomatology including anhedonia.  However, patient reports that he is able to participate in therapies but motivation is challenging and unsure placement post discharge continues to loom over the patient.  We worked on coping and adjustment issues during today's visit.  HPI for the current admission:     HPI: Calvin Schultz is a 73 year old right-handed male with a history of CKD stage III with baseline creatinine 4.9, diabetes mellitus, hypertension, hyperlipidemia, atrial fibrillation with Eliquis. Patient with recent admission 05/30/2023 - 06/05/2023 for acute kidney injury superimposed on CKD with findings of left side hydronephrosis secondary to several calculi in the left renal collecting system with possible staghorn calculus concerning for left UPJ stricture. A nephrostomy tube was placed on the left and patient was discharged with nephrostomy tube that was placed 1/29 as well as ongoing antibiotics transition from ceftriaxone to Bactrim. Patient was also treated for lower extremity edema during that admission with aggressive IV Lasix diuresis. Per chart review patient lives with spouse. Two-level home bed and bath main level with a ramped entrance. Independent prior to admission. Presented 06/26/2023 to [facility] with progressive weakness as well as nausea and vomiting with diarrhea and left lower quadrant pain. Upon arrival to the ED he was noted to be hypotensive received IV fluid resuscitation. Initially blood pressure did respond to IV fluids however he was ultimately required to be started on norepinephrine infusion. CT scan of the abdomen and pelvis completed concerning for colitis of the descending and sigmoid colon. C. difficile antigen and toxin positive. He was started on appropriate antibiotics. He also required a rectal tube that was removed 3/1. Admission chemistries were unremarkable except WBC 29,500, hemoglobin 8.9, sodium 129, BUN 96, creatinine 10.80, hemoglobin A1c 6.2, blood cultures no growth to date. He was started on CRRT on 2/22 followed by renal services discontinued 2/25. Hospital course complicated by bouts of bradycardia 2/26  and was transferred to [facility] for further evaluation. Currently his CRRT has been stopped no current plan for long-term hemodialysis  with latest creatinine 4.93. Nephrostomy tube remains in place per IR with plan for ureteric stent placement 3/21. He remains on chronic Eliquis for atrial fibrillation monitoring for episodes of bradycardia with no further workup currently indicated and latest echocardiogram showing ejection fraction of 60 to 65% no wall motion abnormalities. Therapy evaluations completed due to patient's decreased functional mobility was admitted for a comprehensive rehab program.   Medical History:   Past Medical History:  Diagnosis Date   Arthritis    Atrial fibrillation (HCC)    CKD (chronic kidney disease) stage 3, GFR 30-59 ml/min (HCC)    Diabetes mellitus without complication (HCC)    Dysrhythmia    Hyperlipidemia    Hypertension          Patient Active Problem List   Diagnosis Date Noted   Depression with anxiety 08/10/2023   Acute cystitis with hematuria 08/07/2023   C. difficile diarrhea 08/07/2023   Right ureteral calculus 08/05/2023   Pressure injury of skin 08/05/2023   Leukocytosis 08/05/2023   History of Clostridium difficile colitis 08/05/2023   Controlled diabetes mellitus type 2 with complications (HCC) 08/05/2023   Debility 07/10/2023   Clostridium difficile colitis 07/03/2023   Acute renal failure superimposed on chronic kidney disease (HCC) 06/26/2023   Elevated PSA 05/31/2023   Nephrolithiasis 05/31/2023   Bilateral lower extremity edema 05/31/2023   Chronic atrial fibrillation (HCC) 05/31/2023   Normocytic anemia 05/31/2023   Metabolic acidosis 05/31/2023   Abnormal urinalysis 05/31/2023   Proteinuria 05/31/2023   Acute kidney injury superimposed on chronic kidney disease (HCC) 05/30/2023    Behavioral Observation/Mental Status:   Calvin Schultz  presents as a 73 y.o.-year-old Right handed Caucasian Male who appeared his stated age. his dress was Appropriate and he was Well Groomed and his manners were Appropriate to the situation.  his participation was indicative of  Appropriate and Attentive behaviors.  There were physical disabilities noted.  he displayed an appropriate level of cooperation and motivation.    Interactions:    Active Appropriate  Attention:   within normal limits and attention span and concentration were age appropriate  Memory:   within normal limits; recent and remote memory intact  Visuo-spatial:   not examined  Speech (Volume):  low  Speech:   normal; normal  Thought Process:  Coherent and Relevant  Linear and Logical  Though Content:  WNL; not suicidal and not homicidal  Orientation:   person, place, time/date, and situation  Judgment:   Good  Planning:   Fair  Affect:    Depressed  Mood:    Dysphoric  Insight:   Good  Intelligence:   normal  Family Med/Psych History:  Family History  Problem Relation Age of Onset   Dementia Mother    Impression/DX:   Calvin Schultz is a 73 year old male referred for neuropsychological consultation during his ongoing admission to the comprehensive inpatient rehabilitation unit.  Patient has been dealing with significant medical issues with past medical history including chronic kidney disease, diabetes, hypertension, hyperlipidemia, A-fib.  Patient was admitted in January for acute kidney injury superimposed on chronic kidney disease.  Patient has had progressive weakness and was noted to be hypotensive and recent ED visit at the end of February.  Patient has had metabolic disturbance and continued struggles with medical issues.  Patient has now been admitted down to CIR due to  decreased functional mobility.  During today's clinical visit, the patient and his wife are present.  The patient's been having difficulty coping with extended hospital stay and questions around what to expect for discharge planning etc.  Patient has had worsening of depression and anxiety symptoms and continues to worry about how his wife will manage with all of his difficulties.  Patient's wife noted worsening  of depressive symptomatology including anhedonia.  However, patient reports that he is able to participate in therapies but motivation is challenging and unsure placement post discharge continues to loom over the patient.  We worked on coping and adjustment issues during today's visit.          Electronically Signed   _______________________ Arley Phenix, Psy.D. Clinical Neuropsychologist

## 2023-08-11 ENCOUNTER — Inpatient Hospital Stay (HOSPITAL_COMMUNITY)

## 2023-08-11 DIAGNOSIS — E876 Hypokalemia: Secondary | ICD-10-CM

## 2023-08-11 LAB — BASIC METABOLIC PANEL WITH GFR
Anion gap: 15 (ref 5–15)
BUN: 79 mg/dL — ABNORMAL HIGH (ref 8–23)
CO2: 12 mmol/L — ABNORMAL LOW (ref 22–32)
Calcium: 7.9 mg/dL — ABNORMAL LOW (ref 8.9–10.3)
Chloride: 104 mmol/L (ref 98–111)
Creatinine, Ser: 5.32 mg/dL — ABNORMAL HIGH (ref 0.61–1.24)
GFR, Estimated: 11 mL/min — ABNORMAL LOW (ref >60)
Glucose, Bld: 92 mg/dL (ref 70–99)
Potassium: 3.1 mmol/L — ABNORMAL LOW (ref 3.5–5.1)
Sodium: 131 mmol/L — ABNORMAL LOW (ref 135–145)

## 2023-08-11 LAB — CBC
HCT: 29.6 % — ABNORMAL LOW (ref 39.0–52.0)
Hemoglobin: 9.4 g/dL — ABNORMAL LOW (ref 13.0–17.0)
MCH: 26.5 pg (ref 26.0–34.0)
MCHC: 31.8 g/dL (ref 30.0–36.0)
MCV: 83.4 fL (ref 80.0–100.0)
Platelets: 289 10*3/uL (ref 150–400)
RBC: 3.55 MIL/uL — ABNORMAL LOW (ref 4.22–5.81)
RDW: 16.4 % — ABNORMAL HIGH (ref 11.5–15.5)
WBC: 52.1 10*3/uL (ref 4.0–10.5)
nRBC: 0.1 % (ref 0.0–0.2)

## 2023-08-11 LAB — GLUCOSE, CAPILLARY: Glucose-Capillary: 90 mg/dL (ref 70–99)

## 2023-08-11 LAB — MAGNESIUM: Magnesium: 1.6 mg/dL — ABNORMAL LOW (ref 1.7–2.4)

## 2023-08-11 MED ORDER — MAGNESIUM SULFATE 2 GM/50ML IV SOLN
2.0000 g | Freq: Once | INTRAVENOUS | Status: AC
  Administered 2023-08-11: 2 g via INTRAVENOUS
  Filled 2023-08-11: qty 50

## 2023-08-11 MED ORDER — POTASSIUM CHLORIDE CRYS ER 20 MEQ PO TBCR
40.0000 meq | EXTENDED_RELEASE_TABLET | Freq: Once | ORAL | Status: AC
  Administered 2023-08-11: 40 meq via ORAL
  Filled 2023-08-11: qty 2

## 2023-08-11 NOTE — Progress Notes (Signed)
 PROGRESS NOTE  Calvin Schultz ZOX:[id number] DOB: 1951-01-09 DOA: 07/10/2023 PCP: Gwenlyn Found, MD   LOS: 32 days   Brief narrative:  Calvin Schultz is a 73 y.o. male with past medical history of atrial fibrillation on Eliquis, hypertension, diabetes mellitus type 2, CKD who was recently admitted hospital for acute renal failure and left-sided hydronephrosis secondary to staghorn calculus with left UPC stricture underwent nephrostomy placement.  Rehospitalization from 2/21-3/7 at [facility] with concern for septic shock initially on norepinephrine drip related to colitis of the descending colon. C. difficile antigen and toxin positive.  Patient was noted to have AKI superimposed on CKD stage IV for which nephrology was consulted and patient was placed on CRRT.  Patient was subsequently transferred to inpatient rehab.    Patient had been complaining of left flank pain cough with productive sputum.    A CT scan of the abdomen pelvis was obtained which noted bilateral acute obstructive uropathy with evidence of bilateral collecting system hematuria with large 7- 8 mm diameter obstructing calculus of the right ureter at the pelvic inlet and new left hydronephrosis despite normally positioned double-J ureteral stent, bladder wall inflammation, anasarca, trace pelvic ascites, nodular liver contour giving concern for cirrhosis.  At this time patient has been consulted for medical management of multiple medical issues.   Assessment/Plan: Principal Problem:   Right ureteral calculus Active Problems:   Acute kidney injury superimposed on chronic kidney disease (HCC)   Normocytic anemia   Chronic atrial fibrillation (HCC)   Debility   Metabolic acidosis   Pressure injury of skin   Leukocytosis   History of Clostridium difficile colitis   Controlled diabetes mellitus type 2 with complications (HCC)   Acute cystitis with hematuria   C. difficile diarrhea   Depression with anxiety   Obstructive  uropathy Left AKI on CKD stage IV Metabolic acidosis  Latest creatinine at 5.3.  Patient had previously been on CRRT which was stopped on 3/19.    CT scan of the abdomen noted bilateral acute obstructive uropathy with evidence of bilateral collecting system hematuria with large 7- 8 mm diameter obstructing calculus of the right ureter at the pelvic inlet and new left hydronephrosis despite normally positioned double-J ureteral stent.  Urology was consulted and patient underwent cystoscopy with right retrograde pyelogram and insertion of right ureteral stent on 08/05/2023.  Patient did have staged treatment with Dr. Mena Goes for left obstructive ureteral stone on 07/24/2023.  Nephrology following.  Received albumin and Lasix during hospitalization.  Plan for CT scan of the abdomen today as per nephrology.   Leukocytosis  C. difficile diarrhea.   Significant leukocytosis at 52.1 from 53.2.  Still continues to have loose stools but volume has decreased.  Has been started on Dificid as per ID recommendation.  Denies overt nausea vomiting pain or fever.  Overall clinically feels good.  Hypokalemia hypomagnesemia.  Will replenish.  Check labs in AM.  Hematuria. On Foley catheter.  Urology on board.  Status post ureteric stent placement .  Off anticoagulation.  Urology has plans for further intervention in the future.  Normocytic anemia Hemoglobin latest at 9.4 from 9.1<8.2.  Received 1 unit of packed RBC during hospitalization.  Continue to monitor.  Monitor CBC periodically.   Atrial fibrillation/flutter Had hematuria so Eliquis on hold.  Rate controlled at this time.   History of C diff colitis Has been started on oral Dificid due to nonresponsiveness of oral vancomycin.  ID following as well.  Still got some  diarrhea but volume has decreased.   Controlled diabetes mellitus type 2  Hemoglobin A1c of 6.2 on 06/2023.  Continue sliding scale insulin Accu-Cheks.  Latest POC glucose of 90.  Orthostatic  hypotension -Continue midodrine prn.  Latest blood pressure of 117/57   Debility, deconditioning. Undergoing rehabilitation.   Pressure injury right buttocks stage II, left buttocks stage II.  Present on admission.  Continue wound care. Pressure Injury 07/10/23 Buttocks Right;Mid;Upper Stage 2 -  Partial thickness loss of dermis presenting as a shallow open injury with a red, pink wound bed without slough. Pink opened stage 2 right upper buttock (Active)  07/10/23 1608  Location: Buttocks  Location Orientation: Right;Mid;Upper  Staging: Stage 2 -  Partial thickness loss of dermis presenting as a shallow open injury with a red, pink wound bed without slough.  Wound Description (Comments): Pink opened stage 2 right upper buttock  Present on Admission: Yes     Pressure Injury 07/10/23 Buttocks Right;Mid;Upper Stage 2 -  Partial thickness loss of dermis presenting as a shallow open injury with a red, pink wound bed without slough. Pink stage 2 directly beside the right upper stage 2 (Active)  07/10/23 1609  Location: Buttocks  Location Orientation: Right;Mid;Upper  Staging: Stage 2 -  Partial thickness loss of dermis presenting as a shallow open injury with a red, pink wound bed without slough.  Wound Description (Comments): Pink stage 2 directly beside the right upper stage 2  Present on Admission: Yes     Pressure Injury 07/10/23 Buttocks Right;Mid;Lower Stage 2 -  Partial thickness loss of dermis presenting as a shallow open injury with a red, pink wound bed without slough. Pink stage 2 to right buttock below the upper stage 2 (Active)  07/10/23 1610  Location: Buttocks  Location Orientation: Right;Mid;Lower  Staging: Stage 2 -  Partial thickness loss of dermis presenting as a shallow open injury with a red, pink wound bed without slough.  Wound Description (Comments): Pink stage 2 to right buttock below the upper stage 2  Present on Admission: Yes     Pressure Injury 07/10/23 Buttocks  Left;Mid;Lower Stage 2 -  Partial thickness loss of dermis presenting as a shallow open injury with a red, pink wound bed without slough. Pink stage 2 to left buttock (Active)  07/10/23 1611  Location: Buttocks  Location Orientation: Left;Mid;Lower  Staging: Stage 2 -  Partial thickness loss of dermis presenting as a shallow open injury with a red, pink wound bed without slough.  Wound Description (Comments): Pink stage 2 to left buttock  Present on Admission: Yes      DVT prophylaxis: Place TED hose Start: 07/17/23 1610   Disposition: currently at CIR   Code Status:     Code Status: Full Code  Family Communication: Spoke with the patient's wife at bedside on 08/11/2023  Consultants: Urology Infectious disease,   neuropsychology  Procedures: PRBC transfusion Cystoscopy and double-J ureteric stent placement on the right on 08/05/2023  Anti-infectives:  Cefepime IV Dificid 08/09/23  Anti-infectives (From admission, onward)    Start     Dose/Rate Route Frequency Ordered Stop   08/09/23 1330  fidaxomicin (DIFICID) tablet 200 mg        200 mg Oral 2 times daily 08/09/23 1240     08/07/23 1600  ceFEPIme (MAXIPIME) 2 g in sodium chloride 0.9 % 100 mL IVPB        2 g 200 mL/hr over 30 Minutes Intravenous Every 24 hours 08/07/23 1432 08/17/23 1559  08/07/23 1200  vancomycin (VANCOCIN) capsule 125 mg  Status:  Discontinued        125 mg Oral 4 times daily 08/07/23 0946 08/09/23 1239   08/07/23 0930  vancomycin (VANCOCIN) 50 mg/mL oral solution SOLN 125 mg  Status:  Discontinued        125 mg Per Tube 4 times daily 08/07/23 0908 08/07/23 0946   08/05/23 1315  cefTRIAXone (ROCEPHIN) 2 g in sodium chloride 0.9 % 100 mL IVPB        2 g 200 mL/hr over 30 Minutes Intravenous  Once 08/05/23 1308 08/05/23 1620   08/05/23 0800  cephALEXin (KEFLEX) capsule 250 mg  Status:  Discontinued        250 mg Oral Daily 08/04/23 1916 08/07/23 1432   08/05/23 0000  cephALEXin (KEFLEX) 250 MG capsule         250 mg Oral Daily 08/05/23 0657     07/24/23 0600  cefTRIAXone (ROCEPHIN) 2 g in sodium chloride 0.9 % 100 mL IVPB        2 g 200 mL/hr over 30 Minutes Intravenous 30 min pre-op 07/23/23 1600 07/24/23 0701   07/20/23 0800  cephALEXin (KEFLEX) capsule 250 mg        250 mg Oral 2 times daily 07/17/23 0938 07/25/23 0759       Subjective: Today, patient was seen and examined at bedside.  Patient continues to complain of fatigue weakness.  States that his volume of diarrhea has decreased but is still very loose.  Patient's spouse at bedside.    Objective: Vitals:   08/10/23 2039 08/11/23 0415  BP: 126/68 (!) 117/57  Pulse: 69 68  Resp: 16 16  Temp: 98 F (36.7 C) 98 F (36.7 C)  SpO2: 100% 100%    Intake/Output Summary (Last 24 hours) at 08/11/2023 1054 Last data filed at 08/11/2023 0900 Gross per 24 hour  Intake 577 ml  Output 1150 ml  Net -573 ml   Filed Weights   08/09/23 0439 08/10/23 0612 08/11/23 0500  Weight: 105.1 kg 100.8 kg 100.7 kg   Body mass index is 32.78 kg/m.   Physical Exam:  GENERAL: Patient is alert awake and mildly Communicative, not in obvious distress.  Obese.  Sitting up, on the chair. HENT: No scleral pallor or icterus. Pupils equally reactive to light. Oral mucosa is moist NECK: is supple, no gross swelling noted. CHEST:  Diminished breath sounds bilaterally. CVS: S1 and S2 heard, no murmur.  Irregular rhythm. ABDOMEN: Soft, nonspecific tenderness on palpation.  But no rigidity guarding or rebound tenderness.  Bowel sounds are present.  Foley catheter in place with pinkish urine. EXTREMITIES: Bilateral pitting edema noted. CNS: Cranial nerves are intact. No focal motor deficits. SKIN: warm and dry, pressure injury bilateral buttocks, present on admission.  Data Review: I have personally reviewed the following laboratory data and studies,  CBC: Recent Labs  Lab 08/07/23 0835 08/08/23 0854 08/09/23 1201 08/10/23 0521 08/11/23 0505  WBC  35.3* 44.3* 58.2* 53.2* 52.1*  HGB 8.6* 9.1* 9.4* 8.7* 9.4*  HCT 27.4* 28.3* 30.4* 26.9* 29.6*  MCV 84.6 83.7 84.4 83.0 83.4  PLT 361 365 309 310 289   Basic Metabolic Panel: Recent Labs  Lab 08/05/23 0916 08/06/23 0607 08/07/23 0835 08/08/23 0854 08/09/23 1201 08/10/23 0521 08/11/23 0505  NA 132*   < > 132* 133* 134* 131* 131*  K 4.3   < > 4.6 4.1 4.0 3.5 3.1*  CL 110   < > 110  109 109 105 104  CO2 11*   < > 13* 14* 13* 14* 12*  GLUCOSE 87   < > 137* 93 116* 96 92  BUN 81*   < > 85* 85* 81* 81* 79*  CREATININE 4.69*   < > 5.14* 5.43* 5.28* 5.39* 5.32*  CALCIUM 8.1*   < > 7.9* 7.8* 7.7* 7.9* 7.9*  MG  --   --  2.1  --   --   --  1.6*  PHOS 7.4*  --   --   --   --   --   --    < > = values in this interval not displayed.   Liver Function Tests: Recent Labs  Lab 08/05/23 0916  ALBUMIN 2.0*   No results for input(s): "LIPASE", "AMYLASE" in the last 168 hours. No results for input(s): "AMMONIA" in the last 168 hours. Cardiac Enzymes: No results for input(s): "CKTOTAL", "CKMB", "CKMBINDEX", "TROPONINI" in the last 168 hours. BNP (last 3 results) No results for input(s): "BNP" in the last 8760 hours.  ProBNP (last 3 results) No results for input(s): "PROBNP" in the last 8760 hours.  CBG: Recent Labs  Lab 08/08/23 0643 08/08/23 2045 08/09/23 0618 08/10/23 0611 08/11/23 0614  GLUCAP 102* 100* 93 86 90   Recent Results (from the past 240 hours)  Urine Culture (for pregnant, neutropenic or urologic patients or patients with an indwelling urinary catheter)     Status: Abnormal   Collection Time: 08/04/23  2:08 PM   Specimen: Urine, Catheterized  Result Value Ref Range Status   Specimen Description URINE, CATHETERIZED  Final   Special Requests   Final    NONE Performed at [facility], [address], [place], [place] [address]    Culture >=100,000 COLONIES/mL YEAST (A)  Final   Report Status 08/05/2023 FINAL  Final     Studies: No results  found.     Joycelyn Das, MD  [facility] 08/11/2023  If 7PM-7AM, please contact night-coverage

## 2023-08-11 NOTE — Progress Notes (Signed)
 Physical Therapy Session Note  Patient Details  Name: Calvin Schultz MRN: [id number] Date of Birth: [birth year]  Today's Date: 08/11/2023 PT Individual Time: 1140-1200 PT Individual Time Calculation (min): 20 min  and Today's Date: 08/11/2023 PT Co-Treatment Time: 5366-4403 PT Co-Treatment Time Calculation (min): 18 min  1302-1338 Total Co-Treatment Time: 36 min  Short Term Goals: Week 4:  PT Short Term Goal 1 (Week 4): STG=LTG 2/2 ELOS  Skilled Therapeutic Interventions/Progress Updates:      Treatment Session 1  Pt receiving nursing care upon arrival. Pt missed 10 min 2/2 nursing care.   Pt performed rolling B with mod A, verbal and tactile cues provided for initiation. Donned pants with total A.   Pt performed supine to sit with use of bed features and min A for management of B LE with increased time, and verbal cues provided for technqiue.   Pt performed slide board transfer bed to [facility] with +2 total A with use of chuck pad between pt and slide board, verbal and tactile cues provided for anterior weight shift.   Pt performed posterior scoot as far back in the chair as he could with use of WC arm rests and verbal cues provided for anterior trunk lean with therpaist guarding to prevent anterior slippage. Pt then utilized posterior tilt function to scoot back as far as he can. Therapist reiteratered previous recommendation to utilize tilt function every 20 min for ~3 min for adequate pressure relief. Pt verbalized understanding and agreeable. Pt positioned power WC next to bed with supervision, pt required verbal cues for appropriate use of power controls for tilt versus recline and leg rests versus elevator function.   Pt seated in power WC at end of session with all needs within reach and seat belt on with family in the room.   Treatment Session 2   Family conference completed with patient, [name]), [name]), Social Work, Rehab MD, OT and PT to discuss pt's current functional  performance, level of assistance, goals for rehab stay, any insurance limitations and pt's present medical concerns.  All patient/family questions were answered to the best of the teams ability.  Questions related to foley use were directed towards Urology as they are managing that aspect. Pt/family were reassured that going home with a foley is doable if necessary and will not effect plan to discharge.  Recommend increasing therapy sessions from 30' to 85' to focus on increasing activity tolerance and endurance while also utilizing as much treatment time as possible.  Recommended therapy sessions focus on more hands on practice and training with wife in order for her to gauge how much assist is needed to provide care when at home. Therapy team to have weekly conference tomorrow and select tentative d/c date.   Therapy Documentation Precautions:  Precautions Precautions: Fall Precaution/Restrictions Comments: Foley, bilateral knee pain and decreased ROM Restrictions Weight Bearing Restrictions Per Provider Order: No  Therapy/Group: Individual Therapy/Co-Treatment  [facility] Ambrose Finland, PT, DPT  08/11/2023, 12:12 PM

## 2023-08-11 NOTE — Progress Notes (Signed)
 [facility] Progress Note   Subjective:  Cr stable.  CT pending.  Sitting up in his chair today- looks better.   Objective Vitals:   08/10/23 1553 08/10/23 2039 08/11/23 0415 08/11/23 0500  BP: (!) 142/69 126/68 (!) 117/57   Pulse: 66 69 68   Resp: 16 16 16    Temp: 98.8 F (37.1 C) 98 F (36.7 C) 98 F (36.7 C)   TempSrc: Oral Oral Oral   SpO2: 100% 100% 100%   Weight:    100.7 kg  Height:       Physical Exam General: sitting in chair Heart: RRR Lungs: clear Abdomen: soft Extremities: 3+ L tibial edema 3+ R tibial, LUE 3+ RUE 2+ --> slightly improved GU:  foley draining rosy colored urine  Additional Objective Labs: Basic Metabolic Panel: Recent Labs  Lab 08/05/23 0916 08/06/23 0607 08/09/23 1201 08/10/23 0521 08/11/23 0505  NA 132*   < > 134* 131* 131*  K 4.3   < > 4.0 3.5 3.1*  CL 110   < > 109 105 104  CO2 11*   < > 13* 14* 12*  GLUCOSE 87   < > 116* 96 92  BUN 81*   < > 81* 81* 79*  CREATININE 4.69*   < > 5.28* 5.39* 5.32*  CALCIUM 8.1*   < > 7.7* 7.9* 7.9*  PHOS 7.4*  --   --   --   --    < > = values in this interval not displayed.   Liver Function Tests: Recent Labs  Lab 08/05/23 0916  ALBUMIN 2.0*   No results for input(s): "LIPASE", "AMYLASE" in the last 168 hours. CBC: Recent Labs  Lab 08/07/23 0835 08/08/23 0854 08/09/23 1201 08/10/23 0521 08/11/23 0505  WBC 35.3* 44.3* 58.2* 53.2* 52.1*  HGB 8.6* 9.1* 9.4* 8.7* 9.4*  HCT 27.4* 28.3* 30.4* 26.9* 29.6*  MCV 84.6 83.7 84.4 83.0 83.4  PLT 361 365 309 310 289   Blood Culture    Component Value Date/Time   SDES URINE, CATHETERIZED 08/04/2023 1408   SPECREQUEST  08/04/2023 1408    NONE Performed at [facility], [address] [place], [place] [address]    CULT >=100,000 COLONIES/mL YEAST (A) 08/04/2023 1408   REPTSTATUS 08/05/2023 FINAL 08/04/2023 1408    Cardiac Enzymes: No results for input(s): "CKTOTAL", "CKMB", "CKMBINDEX", "TROPONINI" in the last 168  hours. CBG: Recent Labs  Lab 08/08/23 0643 08/08/23 2045 08/09/23 0618 08/10/23 0611 08/11/23 0614  GLUCAP 102* 100* 93 86 90   Iron Studies: No results for input(s): "IRON", "TIBC", "TRANSFERRIN", "FERRITIN" in the last 72 hours. @lablastinr3 @ Studies/Results: No results found.  Medications:  ceFEPime (MAXIPIME) IV 2 g (08/10/23 1713)      Chlorhexidine Gluconate Cloth  6 each Topical BID   darbepoetin (ARANESP) injection - NON-DIALYSIS  40 mcg Subcutaneous Q Fri-1800   diclofenac Sodium  2 g Topical QID   fidaxomicin  200 mg Oral BID   iron polysaccharides  150 mg Oral Daily   multivitamin  1 tablet Oral QHS   Muscle Rub   Topical TID   psyllium  1 packet Oral BID   sodium bicarbonate  1,300 mg Oral TID   tamsulosin  0.8 mg Oral QPC supper   traMADol  50 mg Oral BID   traZODone  75 mg Oral QHS    Assessment/Plan:72yo M A fib, DM, A fib on eliquis, nephrolithiasis s/p recent PCN for staghorn calculus currently admitted to CIR  for debility and nephrology is reconsulted for AKI on CKD.  **AKI on CKD 4:   Baseline kidney function 05/2023 Cr 4.1 - 5.3 f/b CKA.  After CRRT stopped his creatinine improved to nadir of 3.2 on 3/19 and has gradually trended up 3/27 3.6 > 3/31 4.2 > 4/1 4.45 . Had staghorn calculus on L s/p PCN. Found to have obstructing stone on R s/p OR with ureteral stent 4/2.  Cr trending up further 5.14 > 5.4 4/6 - pending still today.  Remains nonoliguric with no uremic symptoms; no RRT needed at this time but discussed need may arise in coming days.  Give albumin 25g x 2 and lasix 60 x 2 again today and holding IVF.  Daily labs for now.  Strict I/Os.  Avoid hypotension and nephrotoxins.     - will do CT scan today to rule out any other obstructive insults  - no appetite- ? Mild uremia  - may need to consider RRT in the coming days if nothing reversible identified.  - appreciate urology input  **Anemia, normocytic:  Transfuse as needed for Hb < 7.  Having acute  blood loss through foley but H/H stable.  Eliquis has been held.  On aranesp.   **Metabolic acidosis:  worsening in context of AKI; in light of worsening overload d/cd bicarb gtt and switched to oral. Serum bicarb slowly improving.   **A fib:  eliquis on hold for acute bleeding  **Orthostatic hypotension:  has been on midodrine for the last few weeks, BPs look ok currently.   **Debility:  on CIR  Will follow closely with you, please reach out with concerns.   Bufford Buttner MD 08/11/2023, 1:38 PM  [facility] Pager: [phone]

## 2023-08-11 NOTE — Progress Notes (Incomplete)
 Occupational Therapy Session Note  Patient Details  Name: Elgar Scoggins MRN: [id number] Date of Birth: December 27, 1950  {CHL IP REHAB OT TIME CALCULATIONS:304400400}   Short Term Goals: {OT WUJ:8119147}  Skilled Therapeutic Interventions/Progress Updates:      Therapy Documentation Precautions:  Precautions Precautions: Fall Precaution/Restrictions Comments: Foley, bilateral knee pain and decreased ROM Restrictions Weight Bearing Restrictions Per Provider Order: No General:   Vital Signs:   Pain: Pain Assessment Pain Scale: 0-10 Pain Score: 0-No pain Pain Location: Generalized Pain Intervention(s): Medication (See eMAR) ADL: ADL Eating: Set up Where Assessed-Eating: Bed level Grooming: Setup Where Assessed-Grooming: Edge of bed Upper Body Bathing: Minimal assistance Lower Body Bathing: Dependent (secondary to joint constriction associated with arthritis) Where Assessed-Lower Body Bathing: Bed level Upper Body Dressing: Minimal assistance (Secondary to challenges with AROM for BUE coordination) Where Assessed-Upper Body Dressing: Bed level Lower Body Dressing: Dependent Where Assessed-Lower Body Dressing: Bed level Toileting: Dependent Where Assessed-Toileting: Bed level Toilet Transfer: Dependent (challenges with weight bearing through BLE, primarily his knees) Toilet Transfer Method: Other (comment) (Requires the assistance of device) Toilet Transfer Equipment: Other (comment) (didn't occur based on observation of performance with BLE pt presented as a safety risk.) Tub/Shower Transfer: Dependent Tub/Shower Transfer Method:  (didn't occur based on the pt's performance with BLE) Tub/Shower Equipment: Other (comment) (didn't occur secondary to safety concerns with limited mobility of BLE) Film/video editor: Dependent Film/video editor Method: Fish farm manager (based on limitation with BLE) Astronomer: Other (comment) (shower didn't occur,  however, based on limited mobility with BLE the pt presents a Dep at this time and would require the Maxi for safe transfer) Vision   Perception    Praxis   Balance   Exercises:   Other Treatments:     Therapy/Group: {Therapy/Group:[id number]}  Adan Sis 08/11/2023, 10:49 AM

## 2023-08-11 NOTE — Progress Notes (Addendum)
 Occupational Therapy Session Note  Patient Details  Name: Calvin Schultz MRN: [id number] Date of Birth: 01-15-1951  Today's Date: 08/11/2023 Total Session Time: 6213-0865 Total Time: 36 min OT Individual Time: 7846-9629 OT Individual Time Calculation (min): 18 min    Short Term Goals: Week 5:  OT Short Term Goal 1 (Week 5): Pt will consistently demonstrate lateral scoot transfer at Min A with use of SB when transferring from surface to surface. OT Short Term Goal 2 (Week 5): Pt will complete toileting with Mod A while utilizing drop arm BSC. OT Short Term Goal 3 (Week 5): Pt will complete bed mobility while transitioning from supine to seated EOB with Mod A in order to participate in self care tasks and OOB activities.  Skilled Therapeutic Interventions/Progress Updates:    Family conference completed with patient, wife, niece Calvin Schultz), Social Work, Rehab MD, OT and PT to discuss pt's current functional performance, level of assistance, goals for rehab stay, any insurance limitations and pt's present medical concerns.  All patient/family questions were answered to the best of the teams ability.  Questions related to foley use were directed towards Urology as they are managing that aspect. Pt/family were reassured that going home with a foley is doable if necessary and will not effect plan to discharge.  Recommend increasing therapy sessions from 30' to 67' to focus on increasing activity tolerance and endurance while also utilizing as much treatment time as possible.  Recommended therapy sessions focus on more hands on practice and training with wife in order for her to gauge how much assist is needed to provide care when at home. Therapy team to have weekly conference tomorrow and select tentative d/c date.  Therapy Documentation Precautions:  Precautions Precautions: Fall Precaution/Restrictions Comments: Foley, bilateral knee pain and decreased ROM Restrictions Weight Bearing Restrictions  Per Provider Order: No  Therapy/Group: Co-Treatment  Limmie Patricia, OTR/L,CBIS  Supplemental OT - [facility] and [facility] Secure Chat Preferred   08/11/2023, 12:39 PM

## 2023-08-11 NOTE — Progress Notes (Signed)
   6 Days Post-Op Subjective: NAEON. Reviewed plan with Sandip and jis family again today. Clear yellow urine  Objective: Vital signs in last 24 hours: Temp:  [98 F (36.7 C)-98.8 F (37.1 C)] 98 F (36.7 C) (04/08 0415) Pulse Rate:  [66-69] 68 (04/08 0415) Resp:  [16] 16 (04/08 0415) BP: (117-142)/(57-69) 117/57 (04/08 0415) SpO2:  [100 %] 100 % (04/08 0415) Weight:  [100.7 kg] 100.7 kg (04/08 0500)  Assessment/Plan: #left obstructing ureteral stone S/p first step of staged treatment with Dr. Mena Goes 07/24/23  #right obstructing ureteral stone The day before discharging home (4/2) an existing right side stone obstructed and pt underwent cysto/stent placement w/ Dr. Pete Glatter.   Definitive stone mgmt with Dr. Mena Goes on 4/15.   #Hematuria-resolved  If in NSR, would hold anticoagulation until after next procedure. If pt requires anticoagulation d/t ongoing a.fib/a.flutter, could start with heparin trial.  Will need to washout for next surgery. May also consider watchman device.   Urology will follow along peripherally in the event he remains in hospital until the 15th.   Intake/Output from previous day: 04/07 0701 - 04/08 0700 In: 817 [P.O.:817] Out: 1600 [Urine:1600]  Intake/Output this shift: No intake/output data recorded.  Physical Exam:  General: Alert and oriented CV: No cyanosis Lungs: equal chest rise Gu: foley catheter in place draining clear yellow urine  Lab Results: Recent Labs    08/09/23 1201 08/10/23 0521 08/11/23 0505  HGB 9.4* 8.7* 9.4*  HCT 30.4* 26.9* 29.6*   BMET Recent Labs    08/10/23 0521 08/11/23 0505  NA 131* 131*  K 3.5 3.1*  CL 105 104  CO2 14* 12*  GLUCOSE 96 92  BUN 81* 79*  CREATININE 5.39* 5.32*  CALCIUM 7.9* 7.9*     Studies/Results: No results found.     LOS: 32 days   Elmon Kirschner, NP [facility] Pager: [phone]  08/11/2023, 1:31 PM

## 2023-08-11 NOTE — Progress Notes (Signed)
 Patient ID: Calvin Schultz, male   DOB: 26-Nov-1950, 73 y.o.   MRN: [id number]  Patient/Family Conference  Patient/family in attendance: Pt, wife, and their niece  Staff in attendance:Dr Laney Potash, Becky-SW  Main focus:Concerns and questions regarding medical care and plan moving forward with education  Synopsis of information shared:MD discussed medical issues and continued to monitor day by day his medical needs. He reports pt on new C-diff medicine and will be on it approx 10 days started 2 days ago. MD's involved-internal med, ID, hemotology, Urology, and himself. Therapies shared his current level and plan to move forward with more ands on training with wife to plan for discharge. Whether using a slide board or a hoyer lift.   Barriers/concerns expressed by patient and family: Seems to backslide after every procedure and has one 4/15 with urology again. Wife feels loses two weeks and has to begin again when has a procedure.   Patient/family response:Questions and concerns were addressed. Made aware would go home with foley and Urology would determine when to take out. Addressed ambulation question from wife. Goal is wheelchair level and main focus is transfers and maneuvering power chair. Wife willing to do hands on care and prepare for discharge.   Follow-up/action plans:Wife to do hands on care with education on hoyer lift. Aware pt's fatigue level determines amount of minutes can participate. Willing to try to increase to 45 minute sessions. Procedure next Tuesday and medically continue to monitor him daily. Aware insurance may push for discharge due to length of stay here. Have not yet due to medical issues.

## 2023-08-11 NOTE — Plan of Care (Signed)
  Problem: Consults Goal: RH GENERAL PATIENT EDUCATION Description: See Patient Education module for education specifics. Outcome: Progressing   Problem: RH BOWEL ELIMINATION Goal: RH STG MANAGE BOWEL WITH ASSISTANCE Description: STG Manage Bowel withtoileting Assistance. Outcome: Progressing   Problem: RH BLADDER ELIMINATION Goal: RH STG MANAGE BLADDER WITH ASSISTANCE Description: STG Manage Bladder With equipment/min Assistance Outcome: Progressing   Problem: RH SKIN INTEGRITY Goal: RH STG SKIN FREE OF INFECTION/BREAKDOWN Description: Maintain skin free of infection with min assist Outcome: Progressing Goal: RH STG MAINTAIN SKIN INTEGRITY WITH ASSISTANCE Description: STG Maintain Skin Integrity With min Assistance. Outcome: Progressing   Problem: RH SAFETY Goal: RH STG ADHERE TO SAFETY PRECAUTIONS W/ASSISTANCE/DEVICE Description: STG Adhere to Safety Precautions With cues Assistance/Device. Outcome: Progressing   Problem: RH PAIN MANAGEMENT Goal: RH STG PAIN MANAGED AT OR BELOW PT'S PAIN GOAL Description: < 4 with prns Outcome: Progressing   Problem: RH KNOWLEDGE DEFICIT GENERAL Goal: RH STG INCREASE KNOWLEDGE OF SELF CARE AFTER HOSPITALIZATION Description: Patient and spouse will be able to manage care using educational resources for medications and dietary modification independently Outcome: Progressing

## 2023-08-11 NOTE — Progress Notes (Signed)
 Nutrition Follow-up  DOCUMENTATION CODES:   Obesity unspecified  INTERVENTION:   Continue Magic cup TID with meals, each supplement provides 290 kcal and 9 grams of protein. Continue renal MVI daily. Add bedtime snack daily. Wife to continue bringing in meals for patient.   NUTRITION DIAGNOSIS:   Increased nutrient needs related to acute illness (AKI superimposed on CKD IV) as evidenced by estimated needs.  Ongoing   GOAL:   Patient will meet greater than or equal to 90% of their needs  Progressing   MONITOR:   PO intake, Weight trends, Supplement acceptance  REASON FOR ASSESSMENT:   Consult Poor PO  ASSESSMENT:   73 yo male admitted with functional deficits and debility r/t septic shock, C difficile colitis, AKI superimposed on CKD stage IV. PMH includes DM, HTN, arthritis, HLD, CKD, nephrostomy tube placement 06/03/23.  CT 4/2 showed R ureteral calculus.  S/P cystoscopy, R retrograde pyelogram, insertion of R ureteral stent 4/2.  Spoke with wife today who reports patient has been eating a little less since last weeks events. She continues to bring him 2-3 meals per day and he has been eating okay. He likes the magic cups, but has been eating only 50-75% of them. She has been bringing in snacks for patient as well. RD to set up bedtime snacks for patient to receive every day.   Labs reviewed. Na 131, K 3.1, mag 1.6 CBG: 90 this AM  Medications reviewed and include aranesp, dificid, niferex, rena-vit, potassium chloride x 1 today, psyllium, sodium bicarb tablet, flomax, IV mag sulfate x 1 today.  Admit weight 107.8 kg Current weight 100.7 kg  Diet Order:   Diet Order             Diet renal with fluid restriction Fluid restriction: 1200 mL Fluid; Room service appropriate? Yes; Fluid consistency: Thin  Diet effective now                   EDUCATION NEEDS:   Not appropriate for education at this time  Skin:  Skin Assessment: Skin Integrity Issues: Skin  Integrity Issues:: Stage II, Incisions Stage II: left & right buttocks Incisions: coccyx MASD to perineum   Last BM:  3/31 type 6  Height:   Ht Readings from Last 1 Encounters:  08/05/23 5\' 9"  (1.753 m)    Weight:   Wt Readings from Last 1 Encounters:  08/11/23 100.7 kg    Ideal Body Weight:  72.7 kg  BMI:  Body mass index is 32.78 kg/m.  Estimated Nutritional Needs:   Kcal:  2200-2400  Protein:  110-120 g  Fluid:  >/= 2.2 L   Gabriel Rainwater RD, LDN, CNSC Contact via secure chat. If unavailable, use group chat "RD Inpatient."

## 2023-08-11 NOTE — Progress Notes (Addendum)
 PROGRESS NOTE   Subjective/Complaints: Continued to have multiple Bms last night. Family meeting was completed today.   ROS: Patient denies fever, rash, sore throat, blurred vision, dizziness,  vomiting,   cough, shortness of breath or chest pain, headache, or mood change.  + diarrhea and fatigue + poor appetite      Objective:   No results found.     Recent Labs    08/10/23 0521 08/11/23 0505  WBC 53.2* 52.1*  HGB 8.7* 9.4*  HCT 26.9* 29.6*  PLT 310 289     Recent Labs    08/10/23 0521 08/11/23 0505  NA 131* 131*  K 3.5 3.1*  CL 105 104  CO2 14* 12*  GLUCOSE 96 92  BUN 81* 79*  CREATININE 5.39* 5.32*  CALCIUM 7.9* 7.9*      Intake/Output Summary (Last 24 hours) at 08/11/2023 1343 Last data filed at 08/11/2023 0900 Gross per 24 hour  Intake 337 ml  Output 1150 ml  Net -813 ml     Pressure Injury 07/10/23 Buttocks Right;Mid;Upper Stage 2 -  Partial thickness loss of dermis presenting as a shallow open injury with a red, pink wound bed without slough. Pink opened stage 2 right upper buttock (Active)  07/10/23 1608  Location: Buttocks  Location Orientation: Right;Mid;Upper  Staging: Stage 2 -  Partial thickness loss of dermis presenting as a shallow open injury with a red, pink wound bed without slough.  Wound Description (Comments): Pink opened stage 2 right upper buttock  Present on Admission: Yes     Pressure Injury 07/10/23 Buttocks Right;Mid;Upper Stage 2 -  Partial thickness loss of dermis presenting as a shallow open injury with a red, pink wound bed without slough. Pink stage 2 directly beside the right upper stage 2 (Active)  07/10/23 1609  Location: Buttocks  Location Orientation: Right;Mid;Upper  Staging: Stage 2 -  Partial thickness loss of dermis presenting as a shallow open injury with a red, pink wound bed without slough.  Wound Description (Comments): Pink stage 2 directly beside the  right upper stage 2  Present on Admission: Yes     Pressure Injury 07/10/23 Buttocks Right;Mid;Lower Stage 2 -  Partial thickness loss of dermis presenting as a shallow open injury with a red, pink wound bed without slough. Pink stage 2 to right buttock below the upper stage 2 (Active)  07/10/23 1610  Location: Buttocks  Location Orientation: Right;Mid;Lower  Staging: Stage 2 -  Partial thickness loss of dermis presenting as a shallow open injury with a red, pink wound bed without slough.  Wound Description (Comments): Pink stage 2 to right buttock below the upper stage 2  Present on Admission: Yes     Pressure Injury 07/10/23 Buttocks Left;Mid;Lower Stage 2 -  Partial thickness loss of dermis presenting as a shallow open injury with a red, pink wound bed without slough. Pink stage 2 to left buttock (Active)  07/10/23 1611  Location: Buttocks  Location Orientation: Left;Mid;Lower  Staging: Stage 2 -  Partial thickness loss of dermis presenting as a shallow open injury with a red, pink wound bed without slough.  Wound Description (Comments): Pink stage 2 to left buttock  Present on Admission: Yes    Physical Exam: Vital Signs Blood pressure (!) 117/57, pulse 68, temperature 98 F (36.7 C), temperature source Oral, resp. rate 16, height 5\' 9"  (1.753 m), weight 100.7 kg, SpO2 100%.  Constitutional: No distress . Vital signs reviewed. Sitting in power WC HEENT: NCAT, EOMI, oral membranes moist Neck: supple Cardiovascular: RRR without murmur. No JVD    Respiratory/Chest: CTA Bilaterally without wheezes or rales. Normal effort    GI/Abdomen: BS +,  tender, non-distended, tender to palp Ext: no clubbing, cyanosis, 2-3+ LE edema and UE edema Psych: pleasant and cooperative  GU: Foley with amber urine    Skin: No evidence of breakdown, no evidence of rash Neurologic: Cranial nerves II through XII grossly intact, motor strength is moving all 4 extremities to gravity and resistance     MSK: Bilateral knee effusions ongoing. Less pain with ROM Left shoulder tight in ER/IR/F/E            Assessment/Plan: 1. Functional deficits which require 3+ hours per day of interdisciplinary therapy in a comprehensive inpatient rehab setting. Physiatrist is providing close team supervision and 24 hour management of active medical problems listed below. Physiatrist and rehab team continue to assess barriers to discharge/monitor patient progress toward functional and medical goals  Care Tool:  Bathing  Bathing activity did not occur:  (patient completed a simulated task at bed LOF) Body parts bathed by patient: Right arm, Left arm, Chest, Abdomen, Front perineal area, Right upper leg, Left upper leg, Face   Body parts bathed by helper: Buttocks, Left lower leg, Right lower leg     Bathing assist Assist Level: Maximal Assistance - Patient 24 - 49%     Upper Body Dressing/Undressing Upper body dressing   What is the patient wearing?: Pull over shirt    Upper body assist Assist Level: Contact Guard/Touching assist    Lower Body Dressing/Undressing Lower body dressing      What is the patient wearing?: Pants, Underwear/pull up, Incontinence brief     Lower body assist Assist for lower body dressing: Maximal Assistance - Patient 25 - 49%     Toileting Toileting    Toileting assist Assist for toileting: 2 Helpers     Transfers Chair/bed transfer  Transfers assist  Chair/bed transfer activity did not occur: Safety/medical concerns (not safe to get up)  Chair/bed transfer assist level: Maximal Assistance - Patient 25 - 49%     Locomotion Ambulation   Ambulation assist   Ambulation activity did not occur: Safety/medical concerns  Assist level: Dependent - Patient 0% Assistive device: Other (comment) (sara plus) Max distance: 18 steps   Walk 10 feet activity   Assist  Walk 10 feet activity did not occur: Safety/medical concerns        Walk 50 feet  activity   Assist Walk 50 feet with 2 turns activity did not occur: Safety/medical concerns         Walk 150 feet activity   Assist Walk 150 feet activity did not occur: Safety/medical concerns         Walk 10 feet on uneven surface  activity   Assist Walk 10 feet on uneven surfaces activity did not occur: Safety/medical concerns         Wheelchair     Assist Is the patient using a wheelchair?: Yes Type of Wheelchair: Power    Wheelchair assist level: Supervision/Verbal cueing Max wheelchair distance: 300+    Wheelchair 50 feet with 2 turns activity  Assist        Assist Level: Supervision/Verbal cueing   Wheelchair 150 feet activity     Assist      Assist Level: Supervision/Verbal cueing   Blood pressure (!) 117/57, pulse 68, temperature 98 F (36.7 C), temperature source Oral, resp. rate 16, height 5\' 9"  (1.753 m), weight 100.7 kg, SpO2 100%.   Medical Problem List and Plan: 1. Functional deficits secondary to debility related to septic shock/C. difficile colitis/AKI superimposed on CKD stage IV             -patient may shower with tubes/drains covered             -ELOS/Goals:  3/28, min assist PT/OT             -Continue CIR therapies including PT, OT   -Team meeting scheduled for Tuesday  -family/ team meeting  Tuesday at 1 PM.  -Daily therapy for now  -Family meeting completed today, increase therapy time to 45 minutes, questions answered regarding current current medical/rehab plan   2.  Antithrombotics: -DVT/anticoagulation:  Pharmaceutical: Eliquis             -antiplatelet therapy: N/A   3. Pain Management: tramadol prn, Zanaflex 4 mg twice daily as needed   Biliateral OA of knees  -ordered bilateral neoprine knee sleeves --try today  -voltaren gel tid to knees  -have scheduled tramadol 50mg  at 0700 and 1200 daily. Continue prn as well  Mild adhesive capsulitis left shoulder---  sports cream. Can use voltaren gel also.  Aggressive ROM with therapy and while in bed--continue  -Will check right knee x-ray, left knee x-ray on 3/8 with advanced OA.  Will consult orthopedics for cortisone injections  -3-28: Orthopedics aspirated and injected left knee, with improvement.  Ongoing pain with right knee.  3/31 Knee fluid calcium pyrophosphate cyrystals, pseudogout?, cultures with no growth  -4/2 hydrocodone PRN started yesterday for flank pain  4/3 reconsult orthopedics, will come up and talk to him today about his right knee.  I had spoken with orthopedics on 4/1 are considering oral steroids due to crystals, pseudogout?  But held off due to more acute medical issues  4/4 right knee is feeling better continue to monitor  4/5 continues to be improved although has not had therapy today  4/6-8 reports knee pain is better    4. Mood/Behavior/Sleep: Trazodone 50 mg nightly as needed.  Provide emotional support             -antipsychotic agents: N/A   5. Neuropsych/cognition: This patient is capable of making decisions on his own behalf.   6. Skin/Wound Care: Routine skin checks   7. Fluids/Electrolytes/Nutrition:    -3/14 po intake remains poor, albumin low, weights falling  -will ask RD for assistance. Have spoken to patient about intake  -?IV albumin (see below)  3/18 discussed with dietitian, patient recorded is eating 0% of his meals.  Appears that wife bringing in 3 meals a day and he is eating about 75%, appears to have fairly adequate intake.  3/27 eating frequent outside foods-doesn't like hospital food as much  4/7 encouraged pt to eat any food he feels like. More important to just get calories in at this point  8.  Oliguric AKI superimposed on CKD stage IV.  Baseline creatinine 4.9.  CRRT discontinued 2/25.  No current plan for long-term dialysis.  Follow-up renal services              - Per discussion  with nephrology Dr. Verna Czech, keep Foley catheter in to allow accurate I's and O's through Monday; then,  can remove and do DC Foley trial while continuing to document strict outputs             - Continue daily assessments of renal function per nephrology   3/8- per renal, Cr leveling out- and con't Foley- will remove Monday  -nephrology following, Cr remains in 4's.   3/14 renal function with some improvement today     -suspect lower extremity edema is related to renal function/low albumin   Appreciate nephro input   -3/17 BUN/creatinine slightly improved 34/3.31, appreciate nephrology assistance  3/19 BUN/CR stable at 34/3.24, nephrology signing off, ok to check BMET twice a week  3/23 encouraging po this weekend. happy that he's eating food from home  3/24 BUN/CR overall stable at 40/3.26  -3/31 BUN/Cr a little higher today, IVF 35ml/hr  -4/1 Cr a little higher again, will contact nephrology  -4/3 nephrology note reviewed, IV Lasix discontinued and he was given some Lasix today, changed oral bicarb by nephrology, can give IV as needed  4/4 patient has a lot of edema, nephrology reducing Lasix today and holding IVF creatinine higher at 5.14  4/5 reviewed nephrology note, patient getting albumin and Lasix today  5/7 labs reviewed, similar to yesterday, ongoing mgt per nephro  5/8 Cr stable for past few days, CT ordered by nephrology to r/o other obstructive issue 9.  C. difficile colitis.  Completed course of oral vancomycin 3/7.  DC'd IV Flagyl 3/3. Enteric precautions   3/14 stools formed this morning   3/17 discussed with ID pharmacy, patient having more frequent bowel movements.  Monitor today and if continues consider Dificid treatment  3/18 diarrhea appears to be improving, continue to monitor for now  3/19 Frequent Bms improved, continue to follow  3/21 having more frequent bowel movements again today, continue to monitor and if frequent diarrhea persists this weekend consider treatment with Dificid  3/23- mushy/lqiuid stool again overnight    -no dificid at this point. Discuss with GI  first on Monday    -added fiber to regimen yesterday, will increase to bid today  3/24 mushy/ liquid BMs, about 2 a day yesterday, more other days- will discuss GI  3/25 GI recommended repeat C. difficile screening. Discussed with [facility], hold off on repeat screening test, he only had about 2 bowel movements a day but if continues to have frequent BMs consider retreatment with oral vancomycin for 10 days  3/26 pt reports Bms more firm, continue to monitor   3/28 patient reports BMs have been more solid, continue to monitor.  If worsening diarrhea occurs consider retreatment with vancomycin oral for 10 days  3-29: Diarrhea this a.m., no bowel movements this afternoon recorded  3-30: No BM since yesterday.  3-31 diarrhea continues to be improved overall  4-3 LBM yesterday, reports diarrhea continues to be improved  -4/4 patient with increased diarrhea, discussed with hospitalist and ID.  Restarted oral vancomycin 125 mg 4 times a day for 10 days  -4/5 continue vancomycin oral, hospitalist following appreciate assistance  -4/6 discussed with ID today, will change to Dificid  -4/7 WBC's sl improved to 53k today. Continue fidaxomicin, fiber,etc   -appreciate ID f/u  4/8 WBC a little better today 52.1, continue to monitor. Still having frequent BMs 10.  Status post left nephrostomy tube d/t staghorn calculi. Has Foley Nephrostomy tube placed 1/29 per IR with plan for ureteric stent  placement 3/21              - Careful monitoring of nephrostomy output  -3/14 flomax has been added to improve emptying  -3/21 patient scheduled today for cystoscopy with left retrograde pyelogram, left ureteroscopy laser lithotripsy and left ureteral stent placement.     3/22- procedure appears successful thus far.    -urine clear, patient comfortable   -foley to come out on Monday per urology  3/25 Foley was removed patient continues to have urinary retention requiring IC.  Increase Flomax to 0.8 mg  3/26  intermittent blood noted with IC, if continues will restart foley  3/27 Urine no longer blood tinged, continue current regimen, consider restart foley if reoccurs  3/27 continues to require catheterization but no longer blood-tinged Addendum 3/28 foley started after traumatic cath, possible false passage, continue foley for now, f/u with urology outpatient 4/1 foley with hematuria, denies known trauma to this, will ask nursing to check bladder scan. U/A culture ordered.  Called his urologist to discuss 4/2 CT abdomen/pelvis-bilateral obstructive uropathy with collecting system hematuria, large calculus in right ureter and pelvic inlet, new left hydronephrosis, new anasarca, liver nodules raising possibility of cirrhosis.  Discussed patient with urology - surgical procedure today  Addendum discussed with urology, will hold eliquis for now due to bleeding, check EKG- A flutter -4/3 cystoscopy, right retrograde pyelogram, right ureteral stent insertion yesterday by Dr. Pete Glatter, further procedure scheduled on 4/15 with Dr. Mena Goes.  Discussed with [facility] urology.  Do not think traumatic cath last week caused recurrent bleeding his kidney, his wife was concerned about this.  Patient was having nonbloody urine for several days on the Foley.  Will continue Foley and not attempt intermittent cath.  Patient suspected to have false passage. 4/4 urine appears less dark today, urology considering TXA tomorrow if bleeding is not resolved, Eliquis on hold 4/5 appreciate urology assistance, bleeding does appear to be improving, only blood-tinged urine in Foley tubing.  Consider restart Eliquis when 4/6 Discussed with urology,  bleeding improving, hold off on TXA, continue to hold eliquis for today due to high risk of bleeding 4/7- urine appears to be clearing although hgb down to 8.7 today  -continue serial labs  -don' think TXA is indicated right now  -would keep eliquis on hold 4/8 Continue to  eliques, could consider heparin trail- discuss with hospitalist, would like to get CT scan results before considering  11.  Chronic atrial fibrillation with episodes of bradycardia.  Followed by cardiology services.  Continue Eliquis.  Avoid AV nodal blocking agents   12.  Diabetes mellitus.  Hemoglobin A1c 6.2.    -tightly controlled. Dc SSI and change cbg checks to qam only   CBG (last 3)  Recent Labs    08/09/23 0618 08/10/23 0611 08/11/23 0614  GLUCAP 93 86 90       3-30: Recently increased.  Somewhat expected with intra-articular steroid injection.  Continue to trend.  4/5-7 controlled, continue to monitor  4/8 controlled, continue to monitor   13.  Hypotension.  ProAmatine 10 mg 3 times daily.  Monitor with increased mobility   - Normotensive on admission  3/9- BP running 100-s to 120s systolic- con't regimen  3/19 BP has been on high side, will change midodrine to 5mg  TID PRN orthostatic hypotension  3/28 BP stable, continue current regimen  -4/3-8 BP continue current regimen and monitor     08/11/2023    5:00 AM 08/11/2023    4:15  AM 08/10/2023    8:39 PM  Vitals with BMI  Weight 222 lbs    BMI 32.77    Systolic  117 126  Diastolic  57 68  Pulse  68 69    14.  Chronic anemia.  Niferex daily/Aranesp.  Monitor for any bleeding episodes   3/8- Hb 7.1 today- will order transfusion- transfusion of 1 unit pRBCs.   3/14 hgb up to 9.1  -3/17 hemoglobin down to 8.4, continue to monitor  -3/19 HGB 8.3 today, stable overall, continue to monitor    -3/27 HGB 7.7- a little lower likely due to GU procedure/hematuria, continue to monitor  3/31 HGB 7.6 today, transfuse 7 or less  4/7 hgb down to 8.7. hematuria better--continue to monitor serially   16. Leukocytosis  3/9- Pt's WBC is 21.7- up from 16k- is afebrile- due to his recent C diff, that just finished correct dosing of ABX for, called ID for guidance on treatment- don't want to cause more Cdiff- ID consulted ---no new  orders  3/10 WBC's down to 17k  --have been hovering above and below 20 for awhile. No new clinical symptoms. Nephrostomy drainage clear, yellow  3/14 wbc's down to 13k.--recheck Monday   3/17 WBC is down to 12.4  3/19 WBC 13.4 today, continue to monitor  3/24 WBC higher today 18.7, Discuss C diff treatment with GI a above 3/31 WBC still elevated 19.0, monitor for signs of infection 4/3 WBC still elevated to 23.8, potentially due to his urological issues and procedure.  Hospitalist also following appreciate assistance  4/4 WBC higher today, oral vancomycin started as above.  Discussed with ID Patrecia Pace and hospitalist.  Patient had Pseudomonas resulting in prior urine culture.  Recommended cefepime for approximately 10 days depending on WBC.  Called pharmacy for cefepime dosing.  ID recommended replacing Foley due to yeast on last culture, discussed with urology who indicates this was already done so hold off on changing again.  4/5 continue current antibiotic regimen, hospitalist team following.  WBC higher today-discussed with hospitalist yesterday, often see higher WBCs with C. difficile.  Continue monitor trend daily.  Patient remains afebrile and vital stable  4/7 WBC's 53k, sl improvement   -continue fidaxomicin   -appreciate ID help   -serial labs  4/8 a little improved to 52, continue to monitor  17. Decreased mood reported by therapy  -Consider SSRI  -3/20 patient reports his mood is okay, denies depression this morning.  Continue to monitor  18. Insomnia   -3/20 schedule trazodone and increase to 75mg  at bedtime  -3/21 insomnia improved but bowel movements at night did wake him up a few times  3/23 sleeping better when stooling isn't an issue  19. LE edema  -Significantly improved on the left after steroid injection and effusion tap.  -IVF discontinued as above  - Nephrology following, treating with Lasix, albumin  -4/7 edema with some improvement. albumin /lasix per  nephro   -elevate/compression   -improve nutrition  20. Low MG and potassium  -Repletion has been ordered  LOS: 32 days A FACE TO FACE EVALUATION WAS PERFORMED  Fanny Dance 08/11/2023, 1:43 PM

## 2023-08-12 LAB — GLUCOSE, CAPILLARY
Glucose-Capillary: 93 mg/dL (ref 70–99)
Glucose-Capillary: 98 mg/dL (ref 70–99)
Glucose-Capillary: 117 mg/dL — ABNORMAL HIGH (ref 70–99)

## 2023-08-12 LAB — BASIC METABOLIC PANEL WITH GFR
Anion gap: 16 — ABNORMAL HIGH (ref 5–15)
Anion gap: 13 (ref 5–15)
BUN: 77 mg/dL — ABNORMAL HIGH (ref 8–23)
BUN: 75 mg/dL — ABNORMAL HIGH (ref 8–23)
CO2: 13 mmol/L — ABNORMAL LOW (ref 22–32)
CO2: 17 mmol/L — ABNORMAL LOW (ref 22–32)
Calcium: 7.7 mg/dL — ABNORMAL LOW (ref 8.9–10.3)
Calcium: 8 mg/dL — ABNORMAL LOW (ref 8.9–10.3)
Chloride: 101 mmol/L (ref 98–111)
Chloride: 103 mmol/L (ref 98–111)
Creatinine, Ser: 5.32 mg/dL — ABNORMAL HIGH (ref 0.61–1.24)
Creatinine, Ser: 5.61 mg/dL — ABNORMAL HIGH (ref 0.61–1.24)
GFR, Estimated: 11 mL/min — ABNORMAL LOW (ref >60)
GFR, Estimated: 10 mL/min — ABNORMAL LOW (ref >60)
Glucose, Bld: 98 mg/dL (ref 70–99)
Glucose, Bld: 116 mg/dL — ABNORMAL HIGH (ref 70–99)
Potassium: 3.3 mmol/L — ABNORMAL LOW (ref 3.5–5.1)
Potassium: 3.6 mmol/L (ref 3.5–5.1)
Sodium: 130 mmol/L — ABNORMAL LOW (ref 135–145)
Sodium: 133 mmol/L — ABNORMAL LOW (ref 135–145)

## 2023-08-12 LAB — CBC
HCT: 30.8 % — ABNORMAL LOW (ref 39.0–52.0)
Hemoglobin: 9.9 g/dL — ABNORMAL LOW (ref 13.0–17.0)
MCH: 26.5 pg (ref 26.0–34.0)
MCHC: 32.1 g/dL (ref 30.0–36.0)
MCV: 82.4 fL (ref 80.0–100.0)
Platelets: 326 10*3/uL (ref 150–400)
RBC: 3.74 MIL/uL — ABNORMAL LOW (ref 4.22–5.81)
RDW: 16.3 % — ABNORMAL HIGH (ref 11.5–15.5)
WBC: 40.4 10*3/uL — ABNORMAL HIGH (ref 4.0–10.5)
nRBC: 0 % (ref 0.0–0.2)

## 2023-08-12 LAB — MAGNESIUM: Magnesium: 2.1 mg/dL (ref 1.7–2.4)

## 2023-08-12 LAB — PHOSPHORUS: Phosphorus: 7.3 mg/dL — ABNORMAL HIGH (ref 2.5–4.6)

## 2023-08-12 MED ORDER — POTASSIUM CHLORIDE CRYS ER 20 MEQ PO TBCR
40.0000 meq | EXTENDED_RELEASE_TABLET | Freq: Once | ORAL | Status: AC
  Administered 2023-08-12: 40 meq via ORAL
  Filled 2023-08-12: qty 2

## 2023-08-12 NOTE — Progress Notes (Signed)
 Occupational Therapy Session Note  Patient Details  Name: Calvin Schultz MRN: [id number] Date of Birth: 1951/04/03  Today's Date: 08/12/2023 OT Individual Time: 1417-1500 OT Individual Time Calculation (min): 43 min    Short Term Goals: Week 5:  OT Short Term Goal 1 (Week 5): Pt will consistently demonstrate lateral scoot transfer at Min A with use of SB when transferring from surface to surface. OT Short Term Goal 2 (Week 5): Pt will complete toileting with Mod A while utilizing drop arm BSC. OT Short Term Goal 3 (Week 5): Pt will complete bed mobility while transitioning from supine to seated EOB with Mod A in order to participate in self care tasks and OOB activities.  Skilled Therapeutic Interventions/Progress Updates:  Pt greeted semi reclined in bed, pt agreeable to OT intervention.    Wife present and active during session.   Transfers/bed mobility/functional mobility: pt completed supine>sit with supervision with + time and use of bed features. Wife instructing slide board transfer. Wife required MOD cues for set- up and technique of transfer such as placement of board, donning gait belt, and placement of PWC. Pt was able to complete transfer with overall CGA from this OTA, use of bed pad utilized under buttocks during transfer. Provided MIN cues to position hands appropriately throughout transfer.    Education: did discuss various options for toileting with wife. Wife currently plans to complete SB transfers to drop arm BSC pending continued success with SB transfers, however wife is  also looking into other options. Wife wanting to place doughnut cushion in seat of w/c however provided education on using cushion only in the bed as pt already has roho cushion to assist with comfort and pressure relief and doughnut cushion with slide out when completing SB transfer to PWC. Did educate wife on how to use air pump to put air in roho and doughnut cushio.   Ended session with pt seated in PWC  with all needs within reach.                   Therapy Documentation Precautions:  Precautions Precautions: Fall Precaution/Restrictions Comments: Foley, bilateral knee pain and decreased ROM Restrictions Weight Bearing Restrictions Per Provider Order: No  Pain: Unrated pain reported in buttock, rest breaks and repositioning provided as needed.    Therapy/Group: Individual Therapy  Pollyann Glen [facility] 08/12/2023, 3:12 PM

## 2023-08-12 NOTE — Patient Care Conference (Signed)
 Inpatient RehabilitationTeam Conference and Plan of Care Update Date: 08/12/2023   Time: 12:01 PM    Patient Name: Calvin Schultz      Medical Record Number: [id number]  Date of Birth: [birth year] Sex: Male         Room/Bed: 4M01C/4M01C-01 Payor Info: Payor: [employer] / Plan: [place] PREFERRED / Product Type: *No Product type* /    Admit Date/Time:  07/10/2023  2:55 PM  Primary Diagnosis:  Right ureteral calculus  Hospital Problems: Principal Problem:   Right ureteral calculus Active Problems:   Acute kidney injury superimposed on chronic kidney disease (HCC)   Chronic atrial fibrillation (HCC)   Normocytic anemia   Metabolic acidosis   Debility   Pressure injury of skin   Leukocytosis   History of Clostridium difficile colitis   Controlled diabetes mellitus type 2 with complications (HCC)   Acute cystitis with hematuria   C. difficile diarrhea   Depression with anxiety   Hypokalemia   Hypomagnesemia    Expected Discharge Date: Expected Discharge Date: 08/21/23  Team Members Present: Physician leading conference: Dr. Fanny Dance Social Worker Present: Dossie Der, LCSW Nurse Present: Chana Bode, RN PT Present: Ambrose Finland, PT OT Present: Candee Furbish, OT     Current Status/Progress Goal Weekly Team Focus  Bowel/Bladder   patient unable to urinate has coude foley - hematuria. Several mushy mucus bowel movements daily. LBM 4/9. refused metamucil saying it wasn't working   regain continence of bowel   continue c-diff medications    Swallow/Nutrition/ Hydration               ADL's   Functional performance greatly dependent on fatigue and activity tolerane. LB ADL completed at bed level at Max-Total A, Toileting at bed level: total A, UB ADL seated at EOB Set-up   Max A: toileting, Mod A Bathing, LB dressing. Removed sit to stand, tub/shower, and dynamic standing balance goals.   activity tolerance    Mobility   pt level of assist has been fluctuating 2/2  pain, frequent diarrhea, post procedure, medical issues; on 4/9 pt completed supine to sit with use of bed features and supervision, and slide board transfer with increased time and +2 min A with pt wife positioning WC, assisting with management of foley, placing slide board.   downgraded goals for overall pt consistnecy.  D/C 4/18 follow up HHPT; DME power WC, hospital bed, slide board, non emergency medical transport, possibly hoyer lift recommending WC [facility]; barriers to discharge: surgery scheduled 4/15    Communication                Safety/Cognition/ Behavioral Observations               Pain   patient's knee pain has improved to 5/10   <3/10   assess pain qshift and PRN    Skin   stage 2 on coccyx bilateral. skin tears to right elbow. Foams   promote wound healing  assess skin qshift and PRN. clean patient quickly and q2 turns      Discharge Planning:  Family conference held yersterday to discuss plan and moving forward with hands on education with wife with hoyer lift. Procedure 4/15. MD still following medical issues.   Team Discussion: Patient post right ureteral calculus with debility and C-diff.  Limited by knee pain, diarrhea and A-fib/flutter as functional status depends on fatigue level. Elevated WBC; trending down. Knee pain improved post injection.   Patient on target to meet rehab  goals: Patient currently needs min assist for bed level ADLs/toileting, supervision for supine - sit transfers. Working on slide-board transfers with the wife. Goals for discharge set for mod-max assist.  *See Care Plan and progress notes for long and short-term goals.   Revisions to Treatment Plan:  Nephrology procedure 08/18/23 Family meeting 08/11/23 CT scan of kidneys Nephrology consulting Urology consulting   Teaching Needs: Safety, medications, skin care, diet modifications, transfers, toileting, etc.   Current Barriers to Discharge: Decreased caregiver  support  Possible Resolutions to Barriers: Family education DME: power wheelchair, hospital bed, slide board HH follow up services     Medical Summary Current Status: Debility, Knee pain, CKD, leukocytosis, Poor appetite, Kidney stones/bleeding, Afib, DM2, hypotension,anemia, LE edema  Barriers to Discharge: Electrolyte abnormality;Medical stability;Morbid Obesity;Renal Insufficiency/Failure;Self-care education  Barriers to Discharge Comments: Debility, Knee pain, CKD, leukocytosis, Poor appetite, Kidney stones/bleeding, Afib, DM2, hypotension,anemia, LE edema, additional GU procedure Possible Resolutions to [employer] Focus: IV ABX, CKD treatement- daily labs, Cdiff treatment, consider heparin   Continued Need for Acute Rehabilitation Level of Care: The patient requires daily medical management by a physician with specialized training in physical medicine and rehabilitation for the following reasons: Direction of a multidisciplinary physical rehabilitation program to maximize functional independence : Yes Medical management of patient stability for increased activity during participation in an intensive rehabilitation regime.: Yes Analysis of laboratory values and/or radiology reports with any subsequent need for medication adjustment and/or medical intervention. : Yes   I attest that I was present, lead the team conference, and concur with the assessment and plan of the team.   Chana Bode B 08/12/2023, 3:02 PM

## 2023-08-12 NOTE — Progress Notes (Addendum)
 PROGRESS NOTE   Subjective/Complaints: Continues to have multiple loose bowel movements but improved from prior day.  CT scan completed-renal stone study.  Total more with therapy today.  ROS: Patient denies fever, rash, sore throat, blurred vision, dizziness,  vomiting,   cough, shortness of breath or chest pain, headache, or mood change.  + diarrhea and fatigue- a little improved today + poor appetite      Objective:   CT RENAL STONE STUDY Result Date: 08/12/2023 CLINICAL DATA:  Stone burden eval, post treatment EXAM: CT ABDOMEN AND PELVIS WITHOUT CONTRAST TECHNIQUE: Multidetector CT imaging of the abdomen and pelvis was performed following the standard protocol without IV contrast. RADIATION DOSE REDUCTION: This exam was performed according to the departmental dose-optimization program which includes automated exposure control, adjustment of the mA and/or kV according to patient size and/or use of iterative reconstruction technique. COMPARISON:  CT abdomen pelvis 08/05/2023 FINDINGS: Lower chest: Bilateral lower lobe atelectasis. Hepatobiliary: No focal liver abnormality. Calcified gallstone noted within the gallbladder lumen. No gallbladder wall thickening or pericholecystic fluid. No biliary dilatation. Pancreas: Diffusely atrophic. No focal lesion. Otherwise normal pancreatic contour. No surrounding inflammatory changes. No main pancreatic ductal dilatation. Spleen: Normal in size without focal abnormality. Adrenals/Urinary Tract: No adrenal nodule bilaterally. Bilateral ureteral stents in appropriate position with the proximal pigtail of the left stent within a left superior renal pole calyx. Associated focal calyceal dilatation adjacent to this. Left nephrolithiasis measuring up to 1.7 cm. A 0.6 cm right distal ureterolithiasis. Associated moderate right hydroureter. No ureterolithiasis or hydroureter. The urinary bladder is decompressed  with Foley catheter tip within the urinary bladder lumen. Inflated Foley catheter balloon not visualized. Stomach/Bowel: Stomach is within normal limits. No evidence of small bowel wall thickening or dilatation. Interval development of diffuse large bowel wall thickening and pericolonic fat stranding. Appendix appears normal. Vascular/Lymphatic: No abdominal aorta or iliac aneurysm. Severe atherosclerotic plaque of the aorta and its branches. No abdominal, pelvic, or inguinal lymphadenopathy. Reproductive: Prostate is unremarkable. Other: Small volume simple intraperitoneal free fluid. No intraperitoneal free gas. No organized fluid collection. Musculoskeletal: Diffuse subcutaneus soft tissue edema. No suspicious lytic or blastic osseous lesions. No acute displaced fracture. Multilevel severe degenerative changes of the spine. IMPRESSION: 1. Pancolitis. 2. Small volume simple free fluid ascites. 3. Left nephrolithiasis measuring up to 1.7 cm. 4. Obstructive 0.6 cm right distal ureterolithiasis adjacent to the ureteral stent. 5. Bilateral ureteral stents. 6. Cholelithiasis no evidence of acute cholecystitis. 7.  Aortic Atherosclerosis (ICD10-I70.0). Electronically Signed   By: Tish Frederickson M.D.   On: 08/12/2023 00:41       Recent Labs    08/11/23 0505 08/12/23 0727  WBC 52.1* 40.4*  HGB 9.4* 9.9*  HCT 29.6* 30.8*  PLT 289 326     Recent Labs    08/11/23 0505 08/12/23 0727  NA 131* 130*  K 3.1* 3.3*  CL 104 101  CO2 12* 13*  GLUCOSE 92 98  BUN 79* 77*  CREATININE 5.32* 5.32*  CALCIUM 7.9* 7.7*      Intake/Output Summary (Last 24 hours) at 08/12/2023 1205 Last data filed at 08/11/2023 1900 Gross per 24 hour  Intake --  Output  500 ml  Net -500 ml     Pressure Injury 07/10/23 Buttocks Right;Mid;Upper Stage 2 -  Partial thickness loss of dermis presenting as a shallow open injury with a red, pink wound bed without slough. Pink opened stage 2 right upper buttock (Active)  07/10/23  1608  Location: Buttocks  Location Orientation: Right;Mid;Upper  Staging: Stage 2 -  Partial thickness loss of dermis presenting as a shallow open injury with a red, pink wound bed without slough.  Wound Description (Comments): Pink opened stage 2 right upper buttock  Present on Admission: Yes     Pressure Injury 07/10/23 Buttocks Right;Mid;Upper Stage 2 -  Partial thickness loss of dermis presenting as a shallow open injury with a red, pink wound bed without slough. Pink stage 2 directly beside the right upper stage 2 (Active)  07/10/23 1609  Location: Buttocks  Location Orientation: Right;Mid;Upper  Staging: Stage 2 -  Partial thickness loss of dermis presenting as a shallow open injury with a red, pink wound bed without slough.  Wound Description (Comments): Pink stage 2 directly beside the right upper stage 2  Present on Admission: Yes     Pressure Injury 07/10/23 Buttocks Right;Mid;Lower Stage 2 -  Partial thickness loss of dermis presenting as a shallow open injury with a red, pink wound bed without slough. Pink stage 2 to right buttock below the upper stage 2 (Active)  07/10/23 1610  Location: Buttocks  Location Orientation: Right;Mid;Lower  Staging: Stage 2 -  Partial thickness loss of dermis presenting as a shallow open injury with a red, pink wound bed without slough.  Wound Description (Comments): Pink stage 2 to right buttock below the upper stage 2  Present on Admission: Yes     Pressure Injury 07/10/23 Buttocks Left;Mid;Lower Stage 2 -  Partial thickness loss of dermis presenting as a shallow open injury with a red, pink wound bed without slough. Pink stage 2 to left buttock (Active)  07/10/23 1611  Location: Buttocks  Location Orientation: Left;Mid;Lower  Staging: Stage 2 -  Partial thickness loss of dermis presenting as a shallow open injury with a red, pink wound bed without slough.  Wound Description (Comments): Pink stage 2 to left buttock  Present on Admission: Yes     Physical Exam: Vital Signs Blood pressure 117/66, pulse 66, temperature 97.8 F (36.6 C), resp. rate 18, height 5\' 9"  (1.753 m), weight 99.4 kg, SpO2 100%.  Constitutional: No distress . Vital signs reviewed. Sitting in power WC HEENT: NCAT, EOMI, oral membranes moist Neck: supple Cardiovascular: RRR without murmur. No JVD    Respiratory/Chest: CTA Bilaterally without wheezes or rales. Normal effort    GI/Abdomen: BS +,  tender, non-distended, tender to palp Ext: no clubbing, cyanosis, 2-3+ LE edema and UE edema Psych: pleasant and cooperative  GU: Foley with amber/pink tinged urine     Skin: No evidence of breakdown, no evidence of rash Neurologic: Cranial nerves II through XII grossly intact, motor strength is moving all 4 extremities to gravity and resistance    MSK: Bilateral knee effusions ongoing. Less pain with ROM. Knee sleeves in place Left shoulder tight in ER/IR/F/E            Assessment/Plan: 1. Functional deficits which require 3+ hours per day of interdisciplinary therapy in a comprehensive inpatient rehab setting. Physiatrist is providing close team supervision and 24 hour management of active medical problems listed below. Physiatrist and rehab team continue to assess barriers to discharge/monitor patient progress toward functional and medical  goals  Care Tool:  Bathing  Bathing activity did not occur:  (patient completed a simulated task at bed LOF) Body parts bathed by patient: Right arm, Left arm, Chest, Abdomen, Front perineal area, Right upper leg, Left upper leg, Face   Body parts bathed by helper: Buttocks, Left lower leg, Right lower leg     Bathing assist Assist Level: Maximal Assistance - Patient 24 - 49%     Upper Body Dressing/Undressing Upper body dressing   What is the patient wearing?: Pull over shirt    Upper body assist Assist Level: Contact Guard/Touching assist    Lower Body Dressing/Undressing Lower body dressing      What  is the patient wearing?: Pants, Underwear/pull up, Incontinence brief     Lower body assist Assist for lower body dressing: Maximal Assistance - Patient 25 - 49%     Toileting Toileting    Toileting assist Assist for toileting: 2 Helpers     Transfers Chair/bed transfer  Transfers assist  Chair/bed transfer activity did not occur: Safety/medical concerns (not safe to get up)  Chair/bed transfer assist level: Maximal Assistance - Patient 25 - 49%     Locomotion Ambulation   Ambulation assist   Ambulation activity did not occur: Safety/medical concerns  Assist level: Dependent - Patient 0% Assistive device: Other (comment) (sara plus) Max distance: 18 steps   Walk 10 feet activity   Assist  Walk 10 feet activity did not occur: Safety/medical concerns        Walk 50 feet activity   Assist Walk 50 feet with 2 turns activity did not occur: Safety/medical concerns         Walk 150 feet activity   Assist Walk 150 feet activity did not occur: Safety/medical concerns         Walk 10 feet on uneven surface  activity   Assist Walk 10 feet on uneven surfaces activity did not occur: Safety/medical concerns         Wheelchair     Assist Is the patient using a wheelchair?: Yes Type of Wheelchair: Power    Wheelchair assist level: Supervision/Verbal cueing Max wheelchair distance: 300+    Wheelchair 50 feet with 2 turns activity    Assist        Assist Level: Supervision/Verbal cueing   Wheelchair 150 feet activity     Assist      Assist Level: Supervision/Verbal cueing   Blood pressure 117/66, pulse 66, temperature 97.8 F (36.6 C), resp. rate 18, height 5\' 9"  (1.753 m), weight 99.4 kg, SpO2 100%.   Medical Problem List and Plan: 1. Functional deficits secondary to debility related to septic shock/C. difficile colitis/AKI superimposed on CKD stage IV             -patient may shower with tubes/drains covered              -ELOS/Goals:  3/28, min assist PT/OT             -Continue CIR therapies including PT, OT   -Team meeting scheduled for Tuesday  -family/ team meeting  Tuesday at 1 PM.  -Daily therapy for now  -Family meeting completed yesterday  -Team conference today please see physician documentation under team conference tab, met with team  to discuss problems,progress, and goals. Formulized individual treatment plan based on medical history, underlying problem and comorbidities.     2.  Antithrombotics: -DVT/anticoagulation:  Pharmaceutical: Eliquis             -  antiplatelet therapy: N/A   3. Pain Management: tramadol prn, Zanaflex 4 mg twice daily as needed   Biliateral OA of knees  -ordered bilateral neoprine knee sleeves --try today  -voltaren gel tid to knees  -have scheduled tramadol 50mg  at 0700 and 1200 daily. Continue prn as well  Mild adhesive capsulitis left shoulder---  sports cream. Can use voltaren gel also. Aggressive ROM with therapy and while in bed--continue  -Will check right knee x-ray, left knee x-ray on 3/8 with advanced OA.  Will consult orthopedics for cortisone injections  -3-28: Orthopedics aspirated and injected left knee, with improvement.  Ongoing pain with right knee.  3/31 Knee fluid calcium pyrophosphate cyrystals, pseudogout?, cultures with no growth  -4/2 hydrocodone PRN started yesterday for flank pain  4/3 reconsult orthopedics, will come up and talk to him today about his right knee.  I had spoken with orthopedics on 4/1 are considering oral steroids due to crystals, pseudogout?  But held off due to more acute medical issues  4/4 right knee is feeling better continue to monitor  4/5 continues to be improved although has not had therapy today  4/6-9 reports knee pain is better    4. Mood/Behavior/Sleep: Trazodone 50 mg nightly as needed.  Provide emotional support             -antipsychotic agents: N/A   5. Neuropsych/cognition: This patient is capable of  making decisions on his own behalf.   6. Skin/Wound Care: Routine skin checks   7. Fluids/Electrolytes/Nutrition:    -3/14 po intake remains poor, albumin low, weights falling  -will ask RD for assistance. Have spoken to patient about intake  -?IV albumin (see below)  3/18 discussed with dietitian, patient recorded is eating 0% of his meals.  Appears that wife bringing in 3 meals a day and he is eating about 75%, appears to have fairly adequate intake.  3/27 eating frequent outside foods-doesn't like hospital food as much  4/7 encouraged pt to eat any food he feels like. More important to just get calories in at this point  4/9 pt and wife report he was able a little bit more  8.  Oliguric AKI superimposed on CKD stage IV.  Baseline creatinine 4.9.  CRRT discontinued 2/25.  No current plan for long-term dialysis.  Follow-up renal services              - Per discussion with nephrology Dr. Verna Czech, keep Foley catheter in to allow accurate I's and O's through Monday; then, can remove and do DC Foley trial while continuing to document strict outputs             - Continue daily assessments of renal function per nephrology   3/8- per renal, Cr leveling out- and con't Foley- will remove Monday  -nephrology following, Cr remains in 4's.   3/14 renal function with some improvement today     -suspect lower extremity edema is related to renal function/low albumin   Appreciate nephro input   -3/17 BUN/creatinine slightly improved 34/3.31, appreciate nephrology assistance  3/19 BUN/CR stable at 34/3.24, nephrology signing off, ok to check BMET twice a week  3/23 encouraging po this weekend. happy that he's eating food from home  3/24 BUN/CR overall stable at 40/3.26  -3/31 BUN/Cr a little higher today, IVF 75ml/hr  -4/1 Cr a little higher again, will contact nephrology  -4/3 nephrology note reviewed, IV Lasix discontinued and he was given some Lasix today, changed oral bicarb by nephrology, can  give  IV as needed  4/4 patient has a lot of edema, nephrology reducing Lasix today and holding IVF creatinine higher at 5.14  4/5 reviewed nephrology note, patient getting albumin and Lasix today  5/7 labs reviewed, similar to yesterday, ongoing mgt per nephro  5/8 Cr stable for past few days, CT ordered by nephrology to r/o other obstructive issue  5/9 discussed CT scan results with urology by phone, no interventions recommended at this time. Cr stable. Will await formal note for recommendations.  9.  C. difficile colitis.  Completed course of oral vancomycin 3/7.  DC'd IV Flagyl 3/3. Enteric precautions   3/14 stools formed this morning   3/17 discussed with ID pharmacy, patient having more frequent bowel movements.  Monitor today and if continues consider Dificid treatment  3/18 diarrhea appears to be improving, continue to monitor for now  3/19 Frequent Bms improved, continue to follow  3/21 having more frequent bowel movements again today, continue to monitor and if frequent diarrhea persists this weekend consider treatment with Dificid  3/23- mushy/lqiuid stool again overnight    -no dificid at this point. Discuss with GI first on Monday    -added fiber to regimen yesterday, will increase to bid today  3/24 mushy/ liquid BMs, about 2 a day yesterday, more other days- will discuss GI  3/25 GI recommended repeat C. difficile screening. Discussed with [facility], hold off on repeat screening test, he only had about 2 bowel movements a day but if continues to have frequent BMs consider retreatment with oral vancomycin for 10 days  3/26 pt reports Bms more firm, continue to monitor   3/28 patient reports BMs have been more solid, continue to monitor.  If worsening diarrhea occurs consider retreatment with vancomycin oral for 10 days  3-29: Diarrhea this a.m., no bowel movements this afternoon recorded  3-30: No BM since yesterday.  3-31 diarrhea continues to be improved overall  4-3 LBM  yesterday, reports diarrhea continues to be improved  -4/4 patient with increased diarrhea, discussed with hospitalist and ID.  Restarted oral vancomycin 125 mg 4 times a day for 10 days  -4/5 continue vancomycin oral, hospitalist following appreciate assistance  -4/6 discussed with ID today, will change to Dificid  -4/7 WBC's sl improved to 53k today. Continue fidaxomicin, fiber,etc   -appreciate ID f/u  4/8 WBC a little better today 52.1, continue to monitor. Still having frequent Bms  4/9 WBC improved to 40.4, continue to monitor. Diarrhea a little better 10.  Status post left nephrostomy tube d/t staghorn calculi. Has Foley Nephrostomy tube placed 1/29 per IR with plan for ureteric stent placement 3/21              - Careful monitoring of nephrostomy output  -3/14 flomax has been added to improve emptying  -3/21 patient scheduled today for cystoscopy with left retrograde pyelogram, left ureteroscopy laser lithotripsy and left ureteral stent placement.     3/22- procedure appears successful thus far.    -urine clear, patient comfortable   -foley to come out on Monday per urology  3/25 Foley was removed patient continues to have urinary retention requiring IC.  Increase Flomax to 0.8 mg  3/26 intermittent blood noted with IC, if continues will restart foley  3/27 Urine no longer blood tinged, continue current regimen, consider restart foley if reoccurs  3/27 continues to require catheterization but no longer blood-tinged Addendum 3/28 foley started after traumatic cath, possible false passage, continue foley for now, f/u  with urology outpatient 4/1 foley with hematuria, denies known trauma to this, will ask nursing to check bladder scan. U/A culture ordered.  Called his urologist to discuss 4/2 CT abdomen/pelvis-bilateral obstructive uropathy with collecting system hematuria, large calculus in right ureter and pelvic inlet, new left hydronephrosis, new anasarca, liver nodules raising  possibility of cirrhosis.  Discussed patient with urology - surgical procedure today  Addendum discussed with urology, will hold eliquis for now due to bleeding, check EKG- A flutter -4/3 cystoscopy, right retrograde pyelogram, right ureteral stent insertion yesterday by Dr. Pete Glatter, further procedure scheduled on 4/15 with Dr. Mena Goes.  Discussed with [facility] urology.  Do not think traumatic cath last week caused recurrent bleeding his kidney, his wife was concerned about this.  Patient was having nonbloody urine for several days on the Foley.  Will continue Foley and not attempt intermittent cath.  Patient suspected to have false passage. 4/4 urine appears less dark today, urology considering TXA tomorrow if bleeding is not resolved, Eliquis on hold 4/5 appreciate urology assistance, bleeding does appear to be improving, only blood-tinged urine in Foley tubing.  Consider restart Eliquis when 4/6 Discussed with urology,  bleeding improving, hold off on TXA, continue to hold eliquis for today due to high risk of bleeding 4/7- urine appears to be clearing although hgb down to 8.7 today  -continue serial labs  -don' think TXA is indicated right now  -would keep eliquis on hold 4/8 Continue to hold eliques, could consider heparin trail- discuss with hospitalist- CT scan results before considering 4/9 Will check EKG to see if still having afib/flutter, consider heparin infusion, will hold off today as urine looks a little pink/blood tinged  11.  Chronic atrial fibrillation with episodes of bradycardia.  Followed by cardiology services.  Continue Eliquis.  Avoid AV nodal blocking agents   12.  Diabetes mellitus.  Hemoglobin A1c 6.2.    -tightly controlled. Dc SSI and change cbg checks to qam only   CBG (last 3)  Recent Labs    08/11/23 0614 08/12/23 0726 08/12/23 1142  GLUCAP 90 93 98       3-30: Recently increased.  Somewhat expected with intra-articular steroid injection.   Continue to trend.  4/5-7 controlled, continue to monitor  4/8 controlled, continue to monitor   13.  Hypotension.  ProAmatine 10 mg 3 times daily.  Monitor with increased mobility   - Normotensive on admission  3/9- BP running 100-s to 120s systolic- con't regimen  3/19 BP has been on high side, will change midodrine to 5mg  TID PRN orthostatic hypotension  3/28 BP stable, continue current regimen  -4/3-9 BP continue current regimen and monitor     08/12/2023    5:00 AM 08/12/2023    3:21 AM 08/11/2023    8:23 PM  Vitals with BMI  Weight 219 lbs 2 oz    BMI 32.35    Systolic  117 121  Diastolic  66 74  Pulse  66 89    14.  Chronic anemia.  Niferex daily/Aranesp.  Monitor for any bleeding episodes   3/8- Hb 7.1 today- will order transfusion- transfusion of 1 unit pRBCs.   3/14 hgb up to 9.1  -3/17 hemoglobin down to 8.4, continue to monitor  -3/19 HGB 8.3 today, stable overall, continue to monitor    -3/27 HGB 7.7- a little lower likely due to GU procedure/hematuria, continue to monitor  3/31 HGB 7.6 today, transfuse 7 or less  4/7 hgb down to 8.7.  hematuria better--continue to monitor serially   16. Leukocytosis  3/9- Pt's WBC is 21.7- up from 16k- is afebrile- due to his recent C diff, that just finished correct dosing of ABX for, called ID for guidance on treatment- don't want to cause more Cdiff- ID consulted ---no new orders  3/10 WBC's down to 17k  --have been hovering above and below 20 for awhile. No new clinical symptoms. Nephrostomy drainage clear, yellow  3/14 wbc's down to 13k.--recheck Monday   3/17 WBC is down to 12.4  3/19 WBC 13.4 today, continue to monitor  3/24 WBC higher today 18.7, Discuss C diff treatment with GI a above 3/31 WBC still elevated 19.0, monitor for signs of infection 4/3 WBC still elevated to 23.8, potentially due to his urological issues and procedure.  Hospitalist also following appreciate assistance  4/4 WBC higher today, oral vancomycin started  as above.  Discussed with ID Patrecia Pace and hospitalist.  Patient had Pseudomonas resulting in prior urine culture.  Recommended cefepime for approximately 10 days depending on WBC.  Called pharmacy for cefepime dosing.  ID recommended replacing Foley due to yeast on last culture, discussed with urology who indicates this was already done so hold off on changing again.  4/5 continue current antibiotic regimen, hospitalist team following.  WBC higher today-discussed with hospitalist yesterday, often see higher WBCs with C. difficile.  Continue monitor trend daily.  Patient remains afebrile and vital stable  4/7 WBC's 53k, sl improvement   -continue fidaxomicin   -appreciate ID help   -serial labs  4/8 a little improved to 52, continue to monitor  17. Decreased mood reported by therapy  -Consider SSRI  -3/20 patient reports his mood is okay, denies depression this morning.  Continue to monitor  18. Insomnia   -3/20 schedule trazodone and increase to 75mg  at bedtime  -3/21 insomnia improved but bowel movements at night did wake him up a few times  3/23 sleeping better when stooling isn't an issue  19. LE edema  -Significantly improved on the left after steroid injection and effusion tap.  -IVF discontinued as above  - Nephrology following, treating with Lasix, albumin  -4/7 edema with some improvement. albumin /lasix per nephro   -elevate/compression   -improve nutrition  20. Low MG and potassium  -4/8 mg and K+ repletion today  -4/9 addition K+ was ordered LOS: 33 days A FACE TO FACE EVALUATION WAS PERFORMED  Fanny Dance 08/12/2023, 12:05 PM

## 2023-08-12 NOTE — Progress Notes (Incomplete)
 Occupational Therapy Session Note  Patient Details  Name: Calvin Schultz MRN: [id number] Date of Birth: 12/18/1950  {CHL IP REHAB OT TIME CALCULATIONS:[id number]}   Short Term Goals: Week 5:  OT Short Term Goal 1 (Week 5): Pt will consistently demonstrate lateral scoot transfer at Min A with use of SB when transferring from surface to surface. OT Short Term Goal 2 (Week 5): Pt will complete toileting with Mod A while utilizing drop arm BSC. OT Short Term Goal 3 (Week 5): Pt will complete bed mobility while transitioning from supine to seated EOB with Mod A in order to participate in self care tasks and OOB activities.  Skilled Therapeutic Interventions/Progress Updates:      Therapy Documentation Precautions:  Precautions Precautions: Fall Precaution/Restrictions Comments: Foley, bilateral knee pain and decreased ROM Restrictions Weight Bearing Restrictions Per Provider Order: No   Therapy/Group: Individual Therapy  Lou Cal, OTR/L, MSOT  08/12/2023, 9:48 PM

## 2023-08-12 NOTE — Progress Notes (Signed)
 Patient ID: Calvin Schultz, male   DOB: [birth year], 73 y.o.   MRN: [id number]  Met with pt and wife who is present in his room to discuss team conference update. Pt had a good day today and was moving better and wife is here to do hands on education. Both hope he will not need a hoyer at home and can use a transfer board. Report an PA was in and pt will need the stent placed two week sago replaced not working and hope the procedure can be moved up to this week instead of next Tuesday. Made aware discharge date is 4/18. Will await MD's input regarding procedure schedule

## 2023-08-12 NOTE — Progress Notes (Signed)
 [facility] Progress Note   Subjective:  CT showing R obstructing nephrolithiasis.  Cr the same.   Objective Vitals:   08/11/23 2023 08/12/23 0321 08/12/23 0500 08/12/23 1321  BP: 121/74 117/66  119/70  Pulse: 89 66  68  Resp: 18 18  17   Temp: 97.7 F (36.5 C) 97.8 F (36.6 C)  99.1 F (37.3 C)  TempSrc:      SpO2: 100% 100%  100%  Weight:   99.4 kg   Height:       Physical Exam General: sitting in chair Heart: RRR Lungs: clear Abdomen: soft Extremities: 3+ L tibial edema 3+ R tibial, LUE 3+ RUE 2+ --> slightly improved GU:  foley draining rosy colored urine  Additional Objective Labs: Basic Metabolic Panel: Recent Labs  Lab 08/10/23 0521 08/11/23 0505 08/12/23 0727  NA 131* 131* 130*  K 3.5 3.1* 3.3*  CL 105 104 101  CO2 14* 12* 13*  GLUCOSE 96 92 98  BUN 81* 79* 77*  CREATININE 5.39* 5.32* 5.32*  CALCIUM 7.9* 7.9* 7.7*  PHOS  --   --  7.3*   Liver Function Tests: No results for input(s): "AST", "ALT", "ALKPHOS", "BILITOT", "PROT", "ALBUMIN" in the last 168 hours.  No results for input(s): "LIPASE", "AMYLASE" in the last 168 hours. CBC: Recent Labs  Lab 08/08/23 0854 08/09/23 1201 08/10/23 0521 08/11/23 0505 08/12/23 0727  WBC 44.3* 58.2* 53.2* 52.1* 40.4*  HGB 9.1* 9.4* 8.7* 9.4* 9.9*  HCT 28.3* 30.4* 26.9* 29.6* 30.8*  MCV 83.7 84.4 83.0 83.4 82.4  PLT 365 309 310 289 326   Blood Culture    Component Value Date/Time   SDES URINE, CATHETERIZED 08/04/2023 1408   SPECREQUEST  08/04/2023 1408    NONE Performed at [facility], [address], [place], [place] [address]    CULT >=100,000 COLONIES/mL YEAST (A) 08/04/2023 1408   REPTSTATUS 08/05/2023 FINAL 08/04/2023 1408    Cardiac Enzymes: No results for input(s): "CKTOTAL", "CKMB", "CKMBINDEX", "TROPONINI" in the last 168 hours. CBG: Recent Labs  Lab 08/09/23 0618 08/10/23 0611 08/11/23 0614 08/12/23 0726 08/12/23 1142  GLUCAP 93 86 90 93 98   Iron Studies:  No results for input(s): "IRON", "TIBC", "TRANSFERRIN", "FERRITIN" in the last 72 hours. @lablastinr3 @ Studies/Results: CT RENAL STONE STUDY Result Date: 08/12/2023 CLINICAL DATA:  Stone burden eval, post treatment EXAM: CT ABDOMEN AND PELVIS WITHOUT CONTRAST TECHNIQUE: Multidetector CT imaging of the abdomen and pelvis was performed following the standard protocol without IV contrast. RADIATION DOSE REDUCTION: This exam was performed according to the departmental dose-optimization program which includes automated exposure control, adjustment of the mA and/or kV according to patient size and/or use of iterative reconstruction technique. COMPARISON:  CT abdomen pelvis 08/05/2023 FINDINGS: Lower chest: Bilateral lower lobe atelectasis. Hepatobiliary: No focal liver abnormality. Calcified gallstone noted within the gallbladder lumen. No gallbladder wall thickening or pericholecystic fluid. No biliary dilatation. Pancreas: Diffusely atrophic. No focal lesion. Otherwise normal pancreatic contour. No surrounding inflammatory changes. No main pancreatic ductal dilatation. Spleen: Normal in size without focal abnormality. Adrenals/Urinary Tract: No adrenal nodule bilaterally. Bilateral ureteral stents in appropriate position with the proximal pigtail of the left stent within a left superior renal pole calyx. Associated focal calyceal dilatation adjacent to this. Left nephrolithiasis measuring up to 1.7 cm. A 0.6 cm right distal ureterolithiasis. Associated moderate right hydroureter. No ureterolithiasis or hydroureter. The urinary bladder is decompressed with Foley catheter tip within the urinary bladder lumen. Inflated Foley catheter balloon not  visualized. Stomach/Bowel: Stomach is within normal limits. No evidence of small bowel wall thickening or dilatation. Interval development of diffuse large bowel wall thickening and pericolonic fat stranding. Appendix appears normal. Vascular/Lymphatic: No abdominal aorta or  iliac aneurysm. Severe atherosclerotic plaque of the aorta and its branches. No abdominal, pelvic, or inguinal lymphadenopathy. Reproductive: Prostate is unremarkable. Other: Small volume simple intraperitoneal free fluid. No intraperitoneal free gas. No organized fluid collection. Musculoskeletal: Diffuse subcutaneus soft tissue edema. No suspicious lytic or blastic osseous lesions. No acute displaced fracture. Multilevel severe degenerative changes of the spine. IMPRESSION: 1. Pancolitis. 2. Small volume simple free fluid ascites. 3. Left nephrolithiasis measuring up to 1.7 cm. 4. Obstructive 0.6 cm right distal ureterolithiasis adjacent to the ureteral stent. 5. Bilateral ureteral stents. 6. Cholelithiasis no evidence of acute cholecystitis. 7.  Aortic Atherosclerosis (ICD10-I70.0). Electronically Signed   By: Tish Frederickson M.D.   On: 08/12/2023 00:41    Medications:  ceFEPime (MAXIPIME) IV 2 g (08/11/23 1611)      Chlorhexidine Gluconate Cloth  6 each Topical BID   darbepoetin (ARANESP) injection - NON-DIALYSIS  40 mcg Subcutaneous Q Fri-1800   diclofenac Sodium  2 g Topical QID   fidaxomicin  200 mg Oral BID   iron polysaccharides  150 mg Oral Daily   multivitamin  1 tablet Oral QHS   Muscle Rub   Topical TID   psyllium  1 packet Oral BID   sodium bicarbonate  1,300 mg Oral TID   tamsulosin  0.8 mg Oral QPC supper   traMADol  50 mg Oral BID   traZODone  75 mg Oral QHS    Assessment/Plan:73yo M A fib, DM, A fib on eliquis, nephrolithiasis s/p recent PCN for staghorn calculus currently admitted to CIR for debility and nephrology is reconsulted for AKI on CKD.  **AKI on CKD 4:   Baseline kidney function 05/2023 Cr 4.1 - 5.3 f/b CKA.  After CRRT stopped his creatinine improved to nadir of 3.2 on 3/19 and has gradually trended up 3/27 3.6 > 3/31 4.2 > 4/1 4.45 . Had staghorn calculus on L s/p PCN. Found to have obstructing stone on R s/p OR with ureteral stent 4/2.  Cr trending up further  5.14 > 5.4 4/6 - pending still today.  Remains nonoliguric with no uremic symptoms; no RRT needed at this time but discussed need may arise in coming days.  Give albumin 25g x 2 and lasix 60 x 2 again today and holding IVF.  Daily labs for now.  Strict I/Os.  Avoid hypotension and nephrotoxins.     - will do CT scan today to rule out any other obstructive insults  - may need to consider RRT in the coming days if nothing reversible identified.  - appreciate urology input  - CT scan 08/11/23 showing R obstructing nephrolithasis- expect improvement in AKI once obstruction relieved- will await urology recommendations  **Anemia, normocytic:  Transfuse as needed for Hb < 7.  Having acute blood loss through foley but H/H stable.  Eliquis has been held.  On aranesp.   **Metabolic acidosis:  worsening in context of AKI; in light of worsening overload d/cd bicarb gtt and switched to oral. Serum bicarb slowly improving.   **A fib:  eliquis on hold for acute bleeding  **Orthostatic hypotension:  has been on midodrine for the last few weeks, BPs look ok currently.   **Debility:  on CIR  Will follow closely with you, please reach out with concerns.   Lanora Manis  Signe Colt MD 08/12/2023, 1:22 PM  [facility] Pager: [phone]

## 2023-08-12 NOTE — Progress Notes (Signed)
 PROGRESS NOTE  Calvin Schultz FAO:[id number] DOB: 1950/05/15 DOA: 07/10/2023 PCP: Gwenlyn Found, MD   LOS: 33 days   Brief narrative:  Calvin Schultz is a 73 y.o. male with past medical history of atrial fibrillation on Eliquis, hypertension, diabetes mellitus type 2, CKD who was recently admitted hospital for acute renal failure and left-sided hydronephrosis secondary to staghorn calculus with left UPC stricture underwent nephrostomy placement.  Rehospitalization from 2/21-3/7 at [facility] with concern for septic shock initially on norepinephrine drip related to colitis of the descending colon. C. difficile antigen and toxin positive.  Patient was noted to have AKI superimposed on CKD stage IV for which nephrology was consulted and patient was placed on CRRT.  Patient was subsequently transferred to inpatient rehab.    Patient had been complaining of left flank pain cough with productive sputum.    A CT scan of the abdomen pelvis was obtained which noted bilateral acute obstructive uropathy with evidence of bilateral collecting system hematuria with large 7- 8 mm diameter obstructing calculus of the right ureter at the pelvic inlet and new left hydronephrosis despite normally positioned double-J ureteral stent, bladder wall inflammation, anasarca, trace pelvic ascites, nodular liver contour giving concern for cirrhosis.  At this time patient has been consulted for medical management of multiple medical issues.   Assessment/Plan: Principal Problem:   Right ureteral calculus Active Problems:   Acute kidney injury superimposed on chronic kidney disease (HCC)   Normocytic anemia   Chronic atrial fibrillation (HCC)   Debility   Metabolic acidosis   Pressure injury of skin   Leukocytosis   History of Clostridium difficile colitis   Controlled diabetes mellitus type 2 with complications (HCC)   Acute cystitis with hematuria   C. difficile diarrhea   Depression with anxiety   Hypokalemia    Hypomagnesemia   Obstructive uropathy Left AKI on CKD stage IV Metabolic acidosis  Latest creatinine at 5.3.  Patient had previously been on CRRT which was stopped on 3/19.    CT scan of the abdomen noted bilateral acute obstructive uropathy with evidence of bilateral collecting system hematuria with large 7- 8 mm diameter obstructing calculus of the right ureter at the pelvic inlet and new left hydronephrosis despite normally positioned double-J ureteral stent.  Urology was consulted and patient underwent cystoscopy with right retrograde pyelogram and insertion of right ureteral stent on 08/05/2023.  Patient did have staged treatment with Dr. Mena Goes for left obstructive ureteral stone on 07/24/2023.  Nephrology following.  Received albumin and Lasix during hospitalization.  Patient underwent CT scan of the abdomen on 08/11/23 with right obstructing nephrolithiasis.   Leukocytosis  C. difficile diarrhea.   Significant leukocytosis at 40.4 from 52.1<53.2.  Still continues to have loose stools but volume has decreased.  Continue Dificid as per ID recommendation.    Hypokalemia Will replace orally with 40 mill equivalents of potassium today..  Check labs in AM.   hypomagnesemia.  Improved after replacement.  Latest magnesium of 7.1.  Hematuria. On Foley catheter.  Urology on board.  Status post ureteric stent placement .  Off anticoagulation.  Urology has plans for further intervention in the future.  Still with mildly pinkish urine.  Normocytic anemia Hemoglobin latest at 9.9 from 9.4.   Received 1 unit of packed RBC during hospitalization.  Continue to monitor.  Monitor CBC periodically.   Atrial fibrillation/flutter Had hematuria so Eliquis on hold.  Rate controlled at this time.   History of C diff colitis Has been  started on oral Dificid due to nonresponsiveness of oral vancomycin.  ID following as well.  Still got some diarrhea but volume has decreased.  Overall seems to be improving.    Controlled diabetes mellitus type 2  Hemoglobin A1c of 6.2 on 06/2023.  Continue sliding scale insulin Accu-Cheks.  Latest POC glucose of 98.  Orthostatic hypotension -Continue midodrine prn.  Latest blood pressure of 117/57   Debility, deconditioning. Undergoing rehabilitation.   Pressure injury right buttocks stage II, left buttocks stage II.  Present on admission.  Continue wound care. Pressure Injury 07/10/23 Buttocks Right;Mid;Upper Stage 2 -  Partial thickness loss of dermis presenting as a shallow open injury with a red, pink wound bed without slough. Pink opened stage 2 right upper buttock (Active)  07/10/23 1608  Location: Buttocks  Location Orientation: Right;Mid;Upper  Staging: Stage 2 -  Partial thickness loss of dermis presenting as a shallow open injury with a red, pink wound bed without slough.  Wound Description (Comments): Pink opened stage 2 right upper buttock  Present on Admission: Yes     Pressure Injury 07/10/23 Buttocks Right;Mid;Upper Stage 2 -  Partial thickness loss of dermis presenting as a shallow open injury with a red, pink wound bed without slough. Pink stage 2 directly beside the right upper stage 2 (Active)  07/10/23 1609  Location: Buttocks  Location Orientation: Right;Mid;Upper  Staging: Stage 2 -  Partial thickness loss of dermis presenting as a shallow open injury with a red, pink wound bed without slough.  Wound Description (Comments): Pink stage 2 directly beside the right upper stage 2  Present on Admission: Yes     Pressure Injury 07/10/23 Buttocks Right;Mid;Lower Stage 2 -  Partial thickness loss of dermis presenting as a shallow open injury with a red, pink wound bed without slough. Pink stage 2 to right buttock below the upper stage 2 (Active)  07/10/23 1610  Location: Buttocks  Location Orientation: Right;Mid;Lower  Staging: Stage 2 -  Partial thickness loss of dermis presenting as a shallow open injury with a red, pink wound bed without  slough.  Wound Description (Comments): Pink stage 2 to right buttock below the upper stage 2  Present on Admission: Yes     Pressure Injury 07/10/23 Buttocks Left;Mid;Lower Stage 2 -  Partial thickness loss of dermis presenting as a shallow open injury with a red, pink wound bed without slough. Pink stage 2 to left buttock (Active)  07/10/23 1611  Location: Buttocks  Location Orientation: Left;Mid;Lower  Staging: Stage 2 -  Partial thickness loss of dermis presenting as a shallow open injury with a red, pink wound bed without slough.  Wound Description (Comments): Pink stage 2 to left buttock  Present on Admission: Yes      DVT prophylaxis: Place TED hose Start: 07/17/23 1610   Disposition: currently at CIR   Code Status:     Code Status: Full Code  Family Communication: Spoke with the patient's wife at bedside on 08/11/2023  Consultants: Urology Infectious disease,   neuropsychology  Procedures: PRBC transfusion Cystoscopy and double-J ureteric stent placement on the right on 08/05/2023  Anti-infectives:  Cefepime IV Dificid 08/09/23  Anti-infectives (From admission, onward)    Start     Dose/Rate Route Frequency Ordered Stop   08/09/23 1330  fidaxomicin (DIFICID) tablet 200 mg        200 mg Oral 2 times daily 08/09/23 1240     08/07/23 1600  ceFEPIme (MAXIPIME) 2 g in sodium chloride 0.9 %  100 mL IVPB        2 g 200 mL/hr over 30 Minutes Intravenous Every 24 hours 08/07/23 1432 08/17/23 1559   08/07/23 1200  vancomycin (VANCOCIN) capsule 125 mg  Status:  Discontinued        125 mg Oral 4 times daily 08/07/23 0946 08/09/23 1239   08/07/23 0930  vancomycin (VANCOCIN) 50 mg/mL oral solution SOLN 125 mg  Status:  Discontinued        125 mg Per Tube 4 times daily 08/07/23 0908 08/07/23 0946   08/05/23 1315  cefTRIAXone (ROCEPHIN) 2 g in sodium chloride 0.9 % 100 mL IVPB        2 g 200 mL/hr over 30 Minutes Intravenous  Once 08/05/23 1308 08/05/23 1620   08/05/23 0800   cephALEXin (KEFLEX) capsule 250 mg  Status:  Discontinued        250 mg Oral Daily 08/04/23 1916 08/07/23 1432   08/05/23 0000  cephALEXin (KEFLEX) 250 MG capsule        250 mg Oral Daily 08/05/23 0657     07/24/23 0600  cefTRIAXone (ROCEPHIN) 2 g in sodium chloride 0.9 % 100 mL IVPB        2 g 200 mL/hr over 30 Minutes Intravenous 30 min pre-op 07/23/23 1600 07/24/23 0701   07/20/23 0800  cephALEXin (KEFLEX) capsule 250 mg        250 mg Oral 2 times daily 07/17/23 0938 07/25/23 0759       Subjective: Today, patient was seen and examined at bedside.  Patient continues to complain feeling little more stronger today.  States that his diarrheal volume has decreased.  No nausea vomiting fever chills or rigor.  Has some abdominal cramps while moving his bowels.  Objective: Vitals:   08/12/23 0321 08/12/23 1321  BP: 117/66 119/70  Pulse: 66 68  Resp: 18 17  Temp: 97.8 F (36.6 C) 99.1 F (37.3 C)  SpO2: 100% 100%    Intake/Output Summary (Last 24 hours) at 08/12/2023 1342 Last data filed at 08/12/2023 1249 Gross per 24 hour  Intake 120 ml  Output 500 ml  Net -380 ml   Filed Weights   08/10/23 0612 08/11/23 0500 08/12/23 0500  Weight: 100.8 kg 100.7 kg 99.4 kg   Body mass index is 32.36 kg/m.   Physical Exam:  GENERAL: Patient is alert awake and mildly Communicative, not in obvious distress.  Obese.  Sitting up, on the chair. HENT: No scleral pallor or icterus. Pupils equally reactive to light. Oral mucosa is moist NECK: is supple, no gross swelling noted. CHEST:  Diminished breath sounds bilaterally. CVS: S1 and S2 heard, no murmur.  Irregular rhythm. ABDOMEN: Soft, nontender, no rigidity guarding or tenderness.  Bowel sounds are present.  Foley catheter in place with pinkish urine. EXTREMITIES: Bilateral pitting edema noted. CNS: Cranial nerves are intact. No focal motor deficits. SKIN: warm and dry, pressure injury bilateral buttocks, present on admission.  Data Review:  I have personally reviewed the following laboratory data and studies,  CBC: Recent Labs  Lab 08/08/23 0854 08/09/23 1201 08/10/23 0521 08/11/23 0505 08/12/23 0727  WBC 44.3* 58.2* 53.2* 52.1* 40.4*  HGB 9.1* 9.4* 8.7* 9.4* 9.9*  HCT 28.3* 30.4* 26.9* 29.6* 30.8*  MCV 83.7 84.4 83.0 83.4 82.4  PLT 365 309 310 289 326   Basic Metabolic Panel: Recent Labs  Lab 08/07/23 0835 08/08/23 0854 08/09/23 1201 08/10/23 0521 08/11/23 0505 08/12/23 0727  NA 132* 133* 134* 131* 131* 130*  K 4.6 4.1 4.0 3.5 3.1* 3.3*  CL 110 109 109 105 104 101  CO2 13* 14* 13* 14* 12* 13*  GLUCOSE 137* 93 116* 96 92 98  BUN 85* 85* 81* 81* 79* 77*  CREATININE 5.14* 5.43* 5.28* 5.39* 5.32* 5.32*  CALCIUM 7.9* 7.8* 7.7* 7.9* 7.9* 7.7*  MG 2.1  --   --   --  1.6* 2.1  PHOS  --   --   --   --   --  7.3*   Liver Function Tests: No results for input(s): "AST", "ALT", "ALKPHOS", "BILITOT", "PROT", "ALBUMIN" in the last 168 hours.  No results for input(s): "LIPASE", "AMYLASE" in the last 168 hours. No results for input(s): "AMMONIA" in the last 168 hours. Cardiac Enzymes: No results for input(s): "CKTOTAL", "CKMB", "CKMBINDEX", "TROPONINI" in the last 168 hours. BNP (last 3 results) No results for input(s): "BNP" in the last 8760 hours.  ProBNP (last 3 results) No results for input(s): "PROBNP" in the last 8760 hours.  CBG: Recent Labs  Lab 08/09/23 0618 08/10/23 0611 08/11/23 0614 08/12/23 0726 08/12/23 1142  GLUCAP 93 86 90 93 98   Recent Results (from the past 240 hours)  Urine Culture (for pregnant, neutropenic or urologic patients or patients with an indwelling urinary catheter)     Status: Abnormal   Collection Time: 08/04/23  2:08 PM   Specimen: Urine, Catheterized  Result Value Ref Range Status   Specimen Description URINE, CATHETERIZED  Final   Special Requests   Final    NONE Performed at [facility], [address] [place], [place] [address]    Culture >=100,000  COLONIES/mL YEAST (A)  Final   Report Status 08/05/2023 FINAL  Final     Studies: CT RENAL STONE STUDY Result Date: 08/12/2023 CLINICAL DATA:  Stone burden eval, post treatment EXAM: CT ABDOMEN AND PELVIS WITHOUT CONTRAST TECHNIQUE: Multidetector CT imaging of the abdomen and pelvis was performed following the standard protocol without IV contrast. RADIATION DOSE REDUCTION: This exam was performed according to the departmental dose-optimization program which includes automated exposure control, adjustment of the mA and/or kV according to patient size and/or use of iterative reconstruction technique. COMPARISON:  CT abdomen pelvis 08/05/2023 FINDINGS: Lower chest: Bilateral lower lobe atelectasis. Hepatobiliary: No focal liver abnormality. Calcified gallstone noted within the gallbladder lumen. No gallbladder wall thickening or pericholecystic fluid. No biliary dilatation. Pancreas: Diffusely atrophic. No focal lesion. Otherwise normal pancreatic contour. No surrounding inflammatory changes. No main pancreatic ductal dilatation. Spleen: Normal in size without focal abnormality. Adrenals/Urinary Tract: No adrenal nodule bilaterally. Bilateral ureteral stents in appropriate position with the proximal pigtail of the left stent within a left superior renal pole calyx. Associated focal calyceal dilatation adjacent to this. Left nephrolithiasis measuring up to 1.7 cm. A 0.6 cm right distal ureterolithiasis. Associated moderate right hydroureter. No ureterolithiasis or hydroureter. The urinary bladder is decompressed with Foley catheter tip within the urinary bladder lumen. Inflated Foley catheter balloon not visualized. Stomach/Bowel: Stomach is within normal limits. No evidence of small bowel wall thickening or dilatation. Interval development of diffuse large bowel wall thickening and pericolonic fat stranding. Appendix appears normal. Vascular/Lymphatic: No abdominal aorta or iliac aneurysm. Severe atherosclerotic  plaque of the aorta and its branches. No abdominal, pelvic, or inguinal lymphadenopathy. Reproductive: Prostate is unremarkable. Other: Small volume simple intraperitoneal free fluid. No intraperitoneal free gas. No organized fluid collection. Musculoskeletal: Diffuse subcutaneus soft tissue edema. No suspicious lytic or blastic osseous lesions. No acute displaced  fracture. Multilevel severe degenerative changes of the spine. IMPRESSION: 1. Pancolitis. 2. Small volume simple free fluid ascites. 3. Left nephrolithiasis measuring up to 1.7 cm. 4. Obstructive 0.6 cm right distal ureterolithiasis adjacent to the ureteral stent. 5. Bilateral ureteral stents. 6. Cholelithiasis no evidence of acute cholecystitis. 7.  Aortic Atherosclerosis (ICD10-I70.0). Electronically Signed   By: Tish Frederickson M.D.   On: 08/12/2023 00:41       Joycelyn Das, MD  [facility] 08/12/2023  If 7PM-7AM, please contact night-coverage

## 2023-08-12 NOTE — Progress Notes (Signed)
 Patient with 4:1 rate controlled flutter noted on baseline EKG. Asymptomatic. Reached out to cards on call for input as well as input on initiation of AC--risk v/s benefits with ongoing hematuria. They will have Dr. Odis Hollingshead to follow up in am.

## 2023-08-12 NOTE — Progress Notes (Signed)
 Physical Therapy Weekly Progress Note  Patient Details  Name: Calvin Schultz MRN: [id number] Date of Birth: Jul 06, 1950  PT treatment time: 6440-3474 PT total treatment time: 30 min   Beginning of progress report period: August 03, 2023 End of progress report period: August 12, 2023  Functional performance greatly dependent on fatigue/pain and activity tolerance 2/2 medical issues.  Bed mobility and slide board transfer +2 mod-total A until 4/9 pt able to perform supine to sit with use of bed features and supervision, and slide board transfer with min A. Pt has surgical procedure scheduled 4/15. D/C scheduled for 4/18. Plan to continue with hands on training with pt wife and progress pt overall consistency with mobility. Will modify goals as needed as pt demos improved consistency.      Patient continues to demonstrate the following deficits: muscle weakness and chronic pain, decreased cardiorespiratoy endurance, and decreased sitting balance, decreased postural control, and decreased balance strategies and therefore will continue to benefit from skilled OT intervention to enhance overall performance with BADL and Reduce care partner burden.   Patient continues to demonstrate the following deficits muscle weakness and muscle joint tightness, decreased cardiorespiratoy endurance, and decreased sitting balance, decreased standing balance, decreased postural control, and decreased balance strategies and therefore will continue to benefit from skilled PT intervention to increase functional independence with mobility.  Patient progressing toward long term goals..  Continue plan of care.  PT Short Term Goals Week 4:  PT Short Term Goal 1 (Week 4): STG=LTG 2/2 ELOS Week 5:  PT Short Term Goal 1 (Week 5): STG=LTG 2/2 ELOS  Skilled Therapeutic Interventions/Progress Updates:      Pt supine in bed upon arrival. Pt agreeable to therapy. Pt denies any pain. Pt recently changed by nursing, B knee brace and  ted hose donned by pt wife. Pt wife present during session.   Therapist had extensive conversation with pt regarding recent self limiting behavior, and emphasized importance of pt activating as much as he can tolerate to improve his overall strength/independence and reduce burden of care on his wife--and allow pt to discharge home with slide board transfer versus manual hoyer lift. Pt verbalized understanding. Pt wife also supportive of this conversation.   Therapist encouraging wife to participate in as possible during session to improve comfort with pt handling for home.   Pt performed supine to sit with supervision with HOB elevated and use of bed rails, no verbal cues needed.   Pt wife positioned power WC next to bed for slide board transfer to R with no cues needed. Pt reports she has been practicing navigating it around room when pt is in the bed as recommended.   Pt wife placed slide board with supervision, verbal cues provided for technique/positioning-- therapist recommended use of chuck pad under pt to reduce friction.   Pt wife managed foley with supervision, verbal cues provided for positioning for safety.   Pt performed slide board transfer with increased time and min A, with therapist providing verbal cues to wife to stabilize the board, and how to assist pt when he gets stuck (intermittently), therapist encouraging pt to do as much as he can and encouraging wife to allow pt to do as much as he can.   Pt performed posterior scoot as far as he could into chair with increased time with pt wife standing in front of pt to prevent anterior slippage. Pt then utilized posterior tilt function to perform remainder of posterior scoot, verbal cues provided for posterior  tilt function versus recline function.   Reviewed function of all power controls, and recommended pt take his time to prevent pt from utilizing controls aimlessly for pt overall safety. Pt wife aware of all controls. Recommended  pt outwardly practice with controls when not in therapy to ensure appropriate use of tilt function versus recline function.   Pt seated in power WC with all needs within reach with wife in room and seatbelt on.     Therapy Documentation Precautions:  Precautions Precautions: Fall Precaution/Restrictions Comments: Foley, bilateral knee pain and decreased ROM Restrictions Weight Bearing Restrictions Per Provider Order: No  Therapy/Group: Individual Therapy  [facility] Ambrose [place], [place], DPT  08/12/2023, 3:52 PM

## 2023-08-13 DIAGNOSIS — I4811 Longstanding persistent atrial fibrillation: Secondary | ICD-10-CM

## 2023-08-13 LAB — CBC
HCT: 26.9 % — ABNORMAL LOW (ref 39.0–52.0)
Hemoglobin: 8.5 g/dL — ABNORMAL LOW (ref 13.0–17.0)
MCH: 26.3 pg (ref 26.0–34.0)
MCHC: 31.6 g/dL (ref 30.0–36.0)
MCV: 83.3 fL (ref 80.0–100.0)
Platelets: 300 10*3/uL (ref 150–400)
RBC: 3.23 MIL/uL — ABNORMAL LOW (ref 4.22–5.81)
RDW: 16.4 % — ABNORMAL HIGH (ref 11.5–15.5)
WBC: 27.8 10*3/uL — ABNORMAL HIGH (ref 4.0–10.5)
nRBC: 0 % (ref 0.0–0.2)

## 2023-08-13 LAB — BASIC METABOLIC PANEL WITH GFR
Anion gap: 14 (ref 5–15)
BUN: 75 mg/dL — ABNORMAL HIGH (ref 8–23)
CO2: 15 mmol/L — ABNORMAL LOW (ref 22–32)
Calcium: 7.7 mg/dL — ABNORMAL LOW (ref 8.9–10.3)
Chloride: 103 mmol/L (ref 98–111)
Creatinine, Ser: 5.55 mg/dL — ABNORMAL HIGH (ref 0.61–1.24)
GFR, Estimated: 10 mL/min — ABNORMAL LOW (ref >60)
Glucose, Bld: 101 mg/dL — ABNORMAL HIGH (ref 70–99)
Potassium: 3.6 mmol/L (ref 3.5–5.1)
Sodium: 132 mmol/L — ABNORMAL LOW (ref 135–145)

## 2023-08-13 LAB — GLUCOSE, CAPILLARY: Glucose-Capillary: 99 mg/dL (ref 70–99)

## 2023-08-13 MED ORDER — POTASSIUM CHLORIDE CRYS ER 20 MEQ PO TBCR
40.0000 meq | EXTENDED_RELEASE_TABLET | Freq: Once | ORAL | Status: AC
  Administered 2023-08-13: 40 meq via ORAL
  Filled 2023-08-13: qty 2

## 2023-08-13 NOTE — Consult Note (Cosign Needed)
 Cardiology Consultation   Patient ID: Calvin Schultz MRN: [id number]; DOB: 07-25-1950  Admit date: 07/10/2023 Date of Consult: 08/13/2023  PCP:  Gwenlyn Found, MD   [facility] Providers Cardiologist:  Tessa Lerner, DO   {  Chief complaint: AKI on CKD stage 4, anemia Reason of consult: anticoagulation guidance in setting of A. fib/flutter   Requesting provider: Dr. Natale Lay   Patient Profile:   Calvin Schultz is a 73 y.o. male with a hx of atrial fibrillation previously on Eliquis, history of hypertension, orthostatic hypotension on midodrine, type 2 diabetes, CKD stage 4, who is being seen 08/13/2023 for the evaluation of atrial fibrillation/flutter at the request of Dr. Natale Lay.  History of Present Illness:   Calvin Schultz has past medical history as stated above. He has been in and out of the hospital recently with kidney problems. He was admitted for acute renal failure and left-sided hydronephrosis secondary to staghorn calculus with left UPC stricture underwent nephrostomy placement. Then re-hospitalized from 2/21-3/7 at [facility] for possible septic shock on norepinephrine drip related to colitis of the descending colon. C. difficile antigen and toxin positive. He was noted to have AKI on CKD stage 4 which nephrology was consulted and placed patient on CRRT. He was then transferred to [facility] inpatient rehab.   While in CIR, patient was complaining of left flank pain and cough with productive sputum. CT abd/pelvis showed bilateral obstructive uropathy with bilateral collecting system  hematuria with large 7-8 mm obstructing calculi of the right ureter at pelvic inlet with new left hydronephrosis. Patient was then re-admitted to hospital on 3/7 for medical management of multiple issues.   He was last seen by Dr. Odis Hollingshead 08/2021 as outpatient consultation for atrial fibrillation, referred by his PCP (Dr. Brett Fairy). This was a new diagnosis as of March 2023. Patient  reported being completely asymptomatic and was unaware of when he would go into/come out of atrial fibrillation. EKG was done at this visit and showed NSR with HR 64 bpm with LAFB and RBBB. EKG was provided by PCP which demonstrated A. Fibwith HR 66pm, LAFB and RBBB. During this visit Dr. Odis Hollingshead continued patient on current Eliquis, started him on Toprol 25 mg daily, ordered an echocardiogram to evaluate LV function and structural heart disease, ordered nuclear stress test to rule our ischemia given co-morbid conditions and new onset of A. Fib. Patient reported some snoring, therefore the recommendation for a sleep study to evaluate for OSA was discussed. Patient was previously on lisinopril 20 mg for HTN and was instructed to continue this along with Toprol 25 mg daily. Patient was given these orders and instructed to return in ~ 6 weeks, he never appeared to return to clinic.  It appears that he completed the echocardiogram on 10/2021 that showed LVEF 55-60%, moderate LVH, no significant valvular heart disease. Patient had more recent echocardiogram completed 05/2023 that showed LVEF 60-65%, moderate LVH, normal RV function, biatrial enlargement, borderline dilation of aortic root, 38 mm, normal IVC.   It does not appear that he was seen by another cardiologist since his appointment in 2023. He was seen in the inpatient setting for consult by Dr. Carolan Clines 06/2023 for A. Fib with episodes of bradycardia. During this consultation she recommended to avoid AV nodal blocking agents, continue on Eliquis, keep K over 4, Mag over 2. She discussed notifying cardiology if rates slow to < 20 or with significant pauses, in which case they would have started a dopamine drip.  There does not appear to be any additional notes from cardiology during this admission.   During this current admission, patient has been admitted from CIR on 3/7. Last night, 4/9, the patient had recorded atrial flutter with 4:1 conduction on EKG. He  was asymptomatic during this time. It appears that his primary team has stopped his Eliquis (discontinued on 4/1) due to ongoing hematuria and anemia requiring pRBCs along with various renal and urologic issues. It appears that he has been rate controlled with HR 60-70s during his admission.   After speaking with the patient and his wife, they agree with the history as stated above. They believe that the biggest issue they are dealing with right now is related to his renal function and worsening anemia. The wife states that she believes his Eliquis was originally held in the setting of pre-procedure, which is now something that is not going to be re-discussed until the patient has become stable in the outpatient setting. He is also experiencing hematuria and anemia in the setting of renal failure and acute cystitis. He has required 1 unit of pRBC and is currently on Aranesp (every Friday) for anemia. Overall, with his multiple co-morbidities, his condition seems rather poor.  Past Medical History:  Diagnosis Date   Arthritis    Atrial fibrillation (HCC)    CKD (chronic kidney disease) stage 3, GFR 30-59 ml/min (HCC)    Diabetes mellitus without complication (HCC)    Dysrhythmia    Hyperlipidemia    Hypertension    Past Surgical History:  Procedure Laterality Date   CYSTOSCOPY W/ URETERAL STENT PLACEMENT Right 08/05/2023   Procedure: CYSTOSCOPY, WITH RETROGRADE PYELOGRAM AND URETERAL STENT INSERTION;  Surgeon: Milderd Meager., MD;  Location: [facility] OR;  Service: Urology;  Laterality: Right;   CYSTOSCOPY/URETEROSCOPY/HOLMIUM LASER/STENT PLACEMENT Left 07/24/2023   Procedure: CYSTOSCOPY/URETEROSCOPY/HOLMIUM LASER/ STENT PLACEMENT/REMOVAL OF NEPHROSTOMY TUBE;  Surgeon: Jerilee Field, MD;  Location: [facility] ORS;  Service: Urology;  Laterality: Left;  90 MINUTE CASE   IR NEPHROSTOMY PLACEMENT LEFT  06/03/2023   KIDNEY SURGERY      Home Medications:  Prior to Admission medications   Medication Sig  Start Date End Date Taking? Authorizing Provider  acetaminophen (TYLENOL) 325 MG tablet Take 2 tablets (650 mg total) by mouth every 4 (four) hours as needed for mild pain (pain score 1-3) or fever. 08/05/23   Angiulli, Mcarthur Rossetti, PA-C  cephALEXin (KEFLEX) 250 MG capsule Take 1 capsule (250 mg total) by mouth daily. 08/05/23   Angiulli, Mcarthur Rossetti, PA-C  Darbepoetin Alfa (ARANESP) 40 MCG/0.4ML SOSY injection Inject 0.4 mLs (40 mcg total) into the skin every Friday at 6 PM. 07/10/23   Joseph Art, DO  diclofenac Sodium (VOLTAREN) 1 % GEL Apply 2 g topically 4 (four) times daily. 08/05/23   Angiulli, Mcarthur Rossetti, PA-C  ELIQUIS 5 MG TABS tablet Take 5 mg by mouth 2 (two) times daily. 08/24/21   [provider]  HYDROcodone-acetaminophen (NORCO/VICODIN) 5-325 MG tablet Take 1 tablet by mouth every 6 (six) hours as needed for severe pain (pain score 7-10). 08/05/23   Angiulli, Mcarthur Rossetti, PA-C  insulin aspart (NOVOLOG) 100 UNIT/ML injection Inject 0-9 Units into the skin 3 (three) times daily with meals. 07/10/23   Joseph Art, DO  iron polysaccharides (NIFEREX) 150 MG capsule Take 1 capsule (150 mg total) by mouth daily. 07/11/23   Joseph Art, DO  midodrine (PROAMATINE) 10 MG tablet Take 1 tablet (10 mg total) by mouth 3 (three)  times daily with meals. 07/10/23   Joseph Art, DO  midodrine (PROAMATINE) 5 MG tablet Take 1 tablet (5 mg total) by mouth 3 (three) times daily as needed (Orthosatic hypotension). 08/05/23   Angiulli, Mcarthur Rossetti, PA-C  multivitamin (RENA-VIT) TABS tablet Take 1 tablet by mouth at bedtime. 08/05/23   Angiulli, Mcarthur Rossetti, PA-C  psyllium (HYDROCIL/METAMUCIL) 95 % PACK Take 1 packet by mouth 2 (two) times daily. 08/05/23   Angiulli, Mcarthur Rossetti, PA-C  sodium chloride 0.9 % infusion Inject 50 mLs into the vein continuous. 08/05/23   Angiulli, Mcarthur Rossetti, PA-C  tamsulosin (FLOMAX) 0.4 MG CAPS capsule Take 2 capsules (0.8 mg total) by mouth daily after supper. 08/05/23   Angiulli, Mcarthur Rossetti, PA-C   tiZANidine (ZANAFLEX) 4 MG tablet Take 1 tablet (4 mg total) by mouth 2 (two) times daily as needed for muscle spasms. 07/10/23   Joseph Art, DO  tiZANidine (ZANAFLEX) 4 MG tablet Take 1 tablet (4 mg total) by mouth 2 (two) times daily as needed for muscle spasms. 08/05/23   Angiulli, Mcarthur Rossetti, PA-C  traMADol (ULTRAM) 50 MG tablet Take 1 tablet (50 mg total) by mouth 2 (two) times daily. 08/05/23   Angiulli, Mcarthur Rossetti, PA-C  traZODone (DESYREL) 150 MG tablet Take 0.5 tablets (75 mg total) by mouth at bedtime. 08/05/23   Angiulli, Mcarthur Rossetti, PA-C  traZODone (DESYREL) 50 MG tablet Take 1 tablet (50 mg total) by mouth at bedtime as needed for sleep. 07/10/23   Joseph Art, DO   Inpatient Medications: Scheduled Meds:  Chlorhexidine Gluconate Cloth  6 each Topical BID   darbepoetin (ARANESP) injection - NON-DIALYSIS  40 mcg Subcutaneous Q Fri-1800   diclofenac Sodium  2 g Topical QID   fidaxomicin  200 mg Oral BID   iron polysaccharides  150 mg Oral Daily   multivitamin  1 tablet Oral QHS   Muscle Rub   Topical TID   potassium chloride  40 mEq Oral Once   psyllium  1 packet Oral BID   sodium bicarbonate  1,300 mg Oral TID   tamsulosin  0.8 mg Oral QPC supper   traMADol  50 mg Oral BID   traZODone  75 mg Oral QHS   Continuous Infusions:  ceFEPime (MAXIPIME) IV Stopped (08/12/23 1643)   PRN Meds: acetaminophen, HYDROcodone-acetaminophen, lidocaine, loperamide, midodrine, ondansetron (ZOFRAN) IV, simethicone, tiZANidine, traMADol  Allergies:   No Known Allergies  Social History:   Social History   Socioeconomic History   Marital status: Married    Spouse name: Not on file   Number of children: 1   Years of education: Not on file   Highest education level: Not on file  Occupational History   Not on file  Tobacco Use   Smoking status: Never   Smokeless tobacco: Never  Vaping Use   Vaping status: Never Used  Substance and Sexual Activity   Alcohol use: Never   Drug use: Never    Sexual activity: Not Currently  Other Topics Concern   Not on file  Social History Narrative   Not on file   Social Drivers of Health   Financial Resource Strain: Not on file  Food Insecurity: No Food Insecurity (06/27/2023)   Hunger Vital Sign    Worried About Running Out of Food in the Last Year: Never true    Ran Out of Food in the Last Year: Never true  Transportation Needs: No Transportation Needs (06/27/2023)   PRAPARE - Transportation  Lack of Transportation (Medical): No    Lack of Transportation (Non-Medical): No  Physical Activity: Not on file  Stress: Not on file  Social Connections: Moderately Isolated (06/27/2023)   Social Connection and Isolation Panel [NHANES]    Frequency of Communication with Friends and Family: More than three times a week    Frequency of Social Gatherings with Friends and Family: More than three times a week    Attends Religious Services: Never    Database administrator or Organizations: No    Attends Banker Meetings: Never    Marital Status: Married  Catering manager Violence: Not At Risk (06/27/2023)   Humiliation, Afraid, Rape, and Kick questionnaire    Fear of Current or Ex-Partner: No    Emotionally Abused: No    Physically Abused: No    Sexually Abused: No    Family History:   Family History  Problem Relation Age of Onset   Dementia Mother     ROS:  Please see the history of present illness.  All other ROS reviewed and negative.     Physical Exam/Data:   Vitals:   08/12/23 1321 08/12/23 2111 08/12/23 2123 08/13/23 0626  BP: 119/70  117/65 122/81  Pulse: 68  67 66  Resp: 17  18 18   Temp: 99.1 F (37.3 C)  97.7 F (36.5 C) (!) 97.3 F (36.3 C)  TempSrc: Oral     SpO2: 100%  100% 100%  Weight:  99.2 kg    Height:       Intake/Output Summary (Last 24 hours) at 08/13/2023 1343 Last data filed at 08/13/2023 0845 Gross per 24 hour  Intake 540 ml  Output 800 ml  Net -260 ml      08/12/2023    9:11 PM  08/12/2023    5:00 AM 08/11/2023    5:00 AM  Last 3 Weights  Weight (lbs) 218 lb 11.1 oz 219 lb 2.2 oz 222 lb 0.1 oz  Weight (kg) 99.2 kg 99.4 kg 100.7 kg     Body mass index is 32.3 kg/m.  General:  Alert and up in his automatic wheelchair, able to have full conversation, on room air  HEENT: normal Neck: no JVD Vascular: Distal pulses 2+ bilaterally Cardiac:  normal S1, S2; RRR; no murmur  Lungs:  diminished breath sounds bilaterally Abd: soft, nontender Ext: LE currently in compression stockings unable to properly assess edema  Musculoskeletal:  No deformities Skin: warm and dry  Neuro:   no focal abnormalities noted Psych:  Normal affect   EKG:  The EKG was personally reviewed and demonstrates:  atrial flutter with 4:1 conduction, HR 68, previously seen LAFB, RBBB   Relevant CV Studies: Echocardiogram, 05/31/2023 IMPRESSIONS  1. Left ventricular ejection fraction, by estimation, is 60 to 65% . The left ventricle has normal function. The left ventricle has no regional wall motion abnormalities. The left ventricular internal cavity size was moderately dilated. There is moderate left ventricular hypertrophy. Left ventricular diastolic parameters were normal.  2. Right ventricular systolic function is normal. The right ventricular size is mildly enlarged.  3. Left atrial size was mildly dilated.  4. Right atrial size was mildly dilated.  5. The mitral valve is normal in structure. Mild mitral valve regurgitation. No evidence of mitral stenosis.  6. The aortic valve is tricuspid. Aortic valve regurgitation is not visualized. Aortic valve sclerosis is present, with no evidence of aortic valve stenosis.  7. Aortic dilatation noted. There is borderline dilatation of  the aortic root, measuring 38 mm.  8. The inferior vena cava is normal in size with greater than 50% respiratory variability, suggesting right atrial pressure of 3 mmHg.  Laboratory Data:  High Sensitivity Troponin:  No  results for input(s): "TROPONINIHS" in the last 720 hours.   Chemistry Recent Labs  Lab 08/07/23 0835 08/08/23 0854 08/11/23 0505 08/12/23 0727 08/12/23 1628 08/13/23 0539  NA 132*   < > 131* 130* 133* 132*  K 4.6   < > 3.1* 3.3* 3.6 3.6  CL 110   < > 104 101 103 103  CO2 13*   < > 12* 13* 17* 15*  GLUCOSE 137*   < > 92 98 116* 101*  BUN 85*   < > 79* 77* 75* 75*  CREATININE 5.14*   < > 5.32* 5.32* 5.61* 5.55*  CALCIUM 7.9*   < > 7.9* 7.7* 8.0* 7.7*  MG 2.1  --  1.6* 2.1  --   --   GFRNONAA 11*   < > 11* 11* 10* 10*  ANIONGAP 9   < > 15 16* 13 14   < > = values in this interval not displayed.    No results for input(s): "PROT", "ALBUMIN", "AST", "ALT", "ALKPHOS", "BILITOT" in the last 168 hours. Lipids No results for input(s): "CHOL", "TRIG", "HDL", "LABVLDL", "LDLCALC", "CHOLHDL" in the last 168 hours.  Hematology Recent Labs  Lab 08/11/23 0505 08/12/23 0727 08/13/23 0539  WBC 52.1* 40.4* 27.8*  RBC 3.55* 3.74* 3.23*  HGB 9.4* 9.9* 8.5*  HCT 29.6* 30.8* 26.9*  MCV 83.4 82.4 83.3  MCH 26.5 26.5 26.3  MCHC 31.8 32.1 31.6  RDW 16.4* 16.3* 16.4*  PLT 289 326 300   Thyroid No results for input(s): "TSH", "FREET4" in the last 168 hours.  BNPNo results for input(s): "BNP", "PROBNP" in the last 168 hours.  DDimer No results for input(s): "DDIMER" in the last 168 hours.  Radiology/Studies:  CT RENAL STONE STUDY Result Date: 08/12/2023 CLINICAL DATA:  Stone burden eval, post treatment EXAM: CT ABDOMEN AND PELVIS WITHOUT CONTRAST TECHNIQUE: Multidetector CT imaging of the abdomen and pelvis was performed following the standard protocol without IV contrast. RADIATION DOSE REDUCTION: This exam was performed according to the departmental dose-optimization program which includes automated exposure control, adjustment of the mA and/or kV according to patient size and/or use of iterative reconstruction technique. COMPARISON:  CT abdomen pelvis 08/05/2023 FINDINGS: Lower chest: Bilateral  lower lobe atelectasis. Hepatobiliary: No focal liver abnormality. Calcified gallstone noted within the gallbladder lumen. No gallbladder wall thickening or pericholecystic fluid. No biliary dilatation. Pancreas: Diffusely atrophic. No focal lesion. Otherwise normal pancreatic contour. No surrounding inflammatory changes. No main pancreatic ductal dilatation. Spleen: Normal in size without focal abnormality. Adrenals/Urinary Tract: No adrenal nodule bilaterally. Bilateral ureteral stents in appropriate position with the proximal pigtail of the left stent within a left superior renal pole calyx. Associated focal calyceal dilatation adjacent to this. Left nephrolithiasis measuring up to 1.7 cm. A 0.6 cm right distal ureterolithiasis. Associated moderate right hydroureter. No ureterolithiasis or hydroureter. The urinary bladder is decompressed with Foley catheter tip within the urinary bladder lumen. Inflated Foley catheter balloon not visualized. Stomach/Bowel: Stomach is within normal limits. No evidence of small bowel wall thickening or dilatation. Interval development of diffuse large bowel wall thickening and pericolonic fat stranding. Appendix appears normal. Vascular/Lymphatic: No abdominal aorta or iliac aneurysm. Severe atherosclerotic plaque of the aorta and its branches. No abdominal, pelvic, or inguinal lymphadenopathy. Reproductive: Prostate is  unremarkable. Other: Small volume simple intraperitoneal free fluid. No intraperitoneal free gas. No organized fluid collection. Musculoskeletal: Diffuse subcutaneus soft tissue edema. No suspicious lytic or blastic osseous lesions. No acute displaced fracture. Multilevel severe degenerative changes of the spine. IMPRESSION: 1. Pancolitis. 2. Small volume simple free fluid ascites. 3. Left nephrolithiasis measuring up to 1.7 cm. 4. Obstructive 0.6 cm right distal ureterolithiasis adjacent to the ureteral stent. 5. Bilateral ureteral stents. 6. Cholelithiasis no  evidence of acute cholecystitis. 7.  Aortic Atherosclerosis (ICD10-I70.0). Electronically Signed   By: Tish Frederickson M.D.   On: 08/12/2023 00:41   Assessment and Plan:   Paroxysmal atrial fibrillation/flutter Previously long term anticoagulated on Eliquis, held as of 4/1 History of episodes of bradycardia  Known LAFB, RBBB  Patient has known history of atrial fibrillation, first diagnosed in March 2023 CHA2DS2-VASc score of 3  Has always been rate controlled with HR 60-80s Was previously on Eliquis 5 mg BID, was held 4/1 while admitted with thoughts of patient undergoing a procedure along with his worsening anemia and persistent hematuria  Patient's wife notes that all procedures have been held until patient is stable and discharged   Patient was noted to be in 4:1 atrial flutter last night, per EKG, prior EKGs also show atrial flutter or fibrillation so unsure if truly paroxysmal, patient not currently on telemetry while inpatient  Denied any symptoms currently or during the time of the noted atrial fibrillation He has never been symptomatically aware of his atrial fibrillation  Currently on Aranesp once weekly (Friday) for anemia secondary to renal failure  Hemoglobin noted to be 8.5 today, down from 9.9 yesterday Patient received 1 unit pRBC on 4/2 for worsening anemia (hemoglobin was as low as 7.3) Given all of the patients current co-morbidities and state of health would be reasonable to continue to hold Eliquis until he is clinically more stable and does not have signs of active bleeding with worsening anemia, will discuss with MD  Hypertension Orthostatic hypotension  History of hypertension When last seen by Dr. Odis Hollingshead in 2023 he was on lisinopril and Toprol  Appeared to be on midodrine 10 mg TID for orthostatic hypotension prior to admission, has been ordered PRN while inpatient and does not appear to have been used recently  Patient does not appear to be on any antihypertensives  currently with stable BP Most recent reading 122/81, stable   Per primary  Obstructive uropathy AKI on CKD stage 4 S/p left nephrostomy tube d/t staghorn calculi  Metabolic acidosis  Leukocytosis  C. Diff infection, c. Diff colitis  Hypokalemia Hypomagnesemia Acute cystitis  Hematuria  Anemia Diabetes Overall deconditioning Pressure wounds  Mood disorders Insomnia  LE edema   Risk Assessment/Risk Scores:     CHA2DS2-VASc Score = 3   This indicates a 3.2% annual risk of stroke. The patient's score is based upon: CHF History: 0 HTN History: 1 Diabetes History: 1 Stroke History: 0 Vascular Disease History: 0 Age Score: 1 Gender Score: 0     For questions or updates, please contact [facility] Please consult [email or web address] for contact info under    Signed, Olena Leatherwood, PA-C  08/13/2023 1:43 PM

## 2023-08-13 NOTE — Progress Notes (Signed)
 Received an order from Dr. Signe Colt, Nephrology, to remove existing foley catheter, obtain urine culture, and replace with a new foley. Upon review of the [facility] section, it was noted that the current foley catheter was placed 08/05/23 by Urologist Dr. Elige Radon with 20 Fr. Nurse questioned the appropriateness of new order and sought clarification. Spoke with Jesusita Oka PA, who   recommended contacting both the ordering provider and Urology. Attempted to reach Dr. Signe Colt for clarification via secure chat, no response at this time. Sheria Lang, PA,Urology, was then contacted and advised not to remove the existing foley and to keep catheter in place at this time. Verbal orders placed not to remove Foley Catheter.

## 2023-08-13 NOTE — Progress Notes (Signed)
 Occupational Therapy Session Note  Patient Details  Name: Calvin Schultz MRN: [id number] Date of Birth: 1950/05/26  Today's Date: 08/13/2023 OT Individual Time: 8295-6213 OT Individual Time Calculation (min): 29 min    Short Term Goals: Week 5:  OT Short Term Goal 1 (Week 5): Pt will consistently demonstrate lateral scoot transfer at Min A with use of SB when transferring from surface to surface. OT Short Term Goal 2 (Week 5): Pt will complete toileting with Mod A while utilizing drop arm BSC. OT Short Term Goal 3 (Week 5): Pt will complete bed mobility while transitioning from supine to seated EOB with Mod A in order to participate in self care tasks and OOB activities.  Skilled Therapeutic Interventions/Progress Updates:    Patient agreeable to participate in OT session. Demonstrated abdominal pain/discomfort during session although no pain number provided. Monitored during session.   Bed level tx session completed d/t to pt report of BM incontinence.  Wife present during session and completed hands on toileting and dressing assist at bed level.  Wife assisted with all aspects of toileting and toilet hygiene including bedpan use, cleaning, brief change, bed mobility, and LB dressing (compression socks, shorts). OT provided family education regarding technique of brief change and placement/removal of bedpan. OT assisted with gathering supplies.      Therapy Documentation Precautions:  Precautions Precautions: Fall Precaution/Restrictions Comments: Foley, bilateral knee pain and decreased ROM Restrictions Weight Bearing Restrictions Per Provider Order: No  Therapy/Group: Individual Therapy  Limmie Patricia, OTR/L,CBIS  Supplemental OT - [facility] and [facility] Secure Chat Preferred   08/13/2023, 7:58 AM

## 2023-08-13 NOTE — Progress Notes (Signed)
 PROGRESS NOTE  Calvin Schultz ZOX:[id number] DOB: 1951/02/25 DOA: 07/10/2023 PCP: Calvin Found, MD   LOS: 34 days   Brief narrative:  Calvin Schultz is a 73 y.o. male with past medical history of atrial fibrillation on Eliquis, hypertension, diabetes mellitus type 2, CKD who was recently admitted to the hospital for acute renal failure and left-sided hydronephrosis secondary to staghorn calculus with left UPC stricture underwent nephrostomy placement.  Rehospitalization from 2/21-3/7 at [facility] with concern for septic shock initially on norepinephrine drip related to colitis of the descending colon. C. difficile antigen and toxin positive.  Patient was noted to have AKI superimposed on CKD stage IV for which nephrology was consulted and patient was placed on CRRT.  Patient was subsequently transferred to inpatient rehab.    Patient also had flank pain cough with productive sputum.    A CT scan of the abdomen pelvis was obtained which noted bilateral acute obstructive uropathy with evidence of bilateral collecting system hematuria with large 7- 8 mm diameter obstructing calculus of the right ureter at the pelvic inlet and new left hydronephrosis despite normally positioned double-J ureteral stent, bladder wall inflammation, anasarca, trace pelvic ascites, nodular liver contour giving concern for cirrhosis.  Patient was consulted for medical management of multiple medical issues including diarrhea..   Assessment/Plan: Principal Problem:   Right ureteral calculus Active Problems:   Acute kidney injury superimposed on chronic kidney disease (HCC)   Normocytic anemia   Chronic atrial fibrillation (HCC)   Debility   Metabolic acidosis   Pressure injury of skin   Leukocytosis   History of Clostridium difficile colitis   Controlled diabetes mellitus type 2 with complications (HCC)   Acute cystitis with hematuria   C. difficile diarrhea   Depression with anxiety   Hypokalemia    Hypomagnesemia   Obstructive uropathy Left AKI on CKD stage IV Metabolic acidosis  Latest creatinine at 5.5.  Patient had previously been on CRRT which was stopped on 3/19.    CT scan of the abdomen noted bilateral acute obstructive uropathy with evidence of bilateral collecting system hematuria with large 7- 8 mm diameter obstructing calculus of the right ureter at the pelvic inlet and new left hydronephrosis despite normally positioned double-J ureteral stent.  Urology was consulted and patient underwent cystoscopy with right retrograde pyelogram and insertion of right ureteral stent on 08/05/2023.  Patient did have staged treatment with Dr. Mena Goes for left obstructive ureteral stone on 07/24/2023.  Nephrology following.  Received albumin and Lasix during hospitalization.  Patient underwent CT scan of the abdomen on 08/11/23 with right obstructing nephrolithiasis but as per primary team had discussed with urology with  any need for further intervention..   Leukocytosis  C. difficile diarrhea.   History of C diff colitis Has been started on oral Dificid due to nonresponsiveness of oral vancomycin as per ID recommendation.  Decreased volume of diarrhea. Significant leukocytosis trending down to 27.8 from 40.4 < 52.1<53.2.      Hypokalemia Improved.  Will continue potassium supplement   hypomagnesemia.  Improved after replacement.  Latest magnesium of 2.1  Hematuria. On Foley catheter.  Urology on board.  Status post ureteric stent placement .  Off anticoagulation.  Urology has plans for further intervention in the future.  Still with mildly dark urine.  Normocytic anemia Hemoglobin latest at 8.5 from 9.9 and 9.4.   Received 1 unit of packed RBC during hospitalization.  Continue to monitor.  Monitor CBC periodically.   Atrial fibrillation/flutter Had  hematuria so Eliquis on hold.  Rate controlled at this time.   Class I obesity. Body mass index is 32.3 kg/m.  Would benefit from lifestyle  modification and weight loss.  Controlled diabetes mellitus type 2  Hemoglobin A1c of 6.2 on 06/2023.  Continue sliding scale insulin Accu-Cheks.  Latest POC glucose of 99.  Orthostatic hypotension -Continue midodrine prn.  Latest blood pressure of 117/57 still has some orthostatic dizziness.  Encouraged oral hydration and orthostatic precaution.   Debility, deconditioning. Undergoing rehabilitation.   Pressure injury right buttocks stage II, left buttocks stage II.    Present on admission.  Continue wound care.  DVT prophylaxis: Place TED hose Start: 07/17/23 4098   Disposition: currently at CIR   Code Status:     Code Status: Full Code  Family Communication: Spoke with the patient's wife at bedside on 08/13/2023  Consultants: Urology Infectious disease,   neuropsychology  Procedures: PRBC transfusion Cystoscopy and double-J ureteric stent placement on the right on 08/05/2023  Anti-infectives:  Cefepime IV Dificid 08/09/23  Anti-infectives (From admission, onward)    Start     Dose/Rate Route Frequency Ordered Stop   08/09/23 1330  fidaxomicin (DIFICID) tablet 200 mg        200 mg Oral 2 times daily 08/09/23 1240     08/07/23 1600  ceFEPIme (MAXIPIME) 2 g in sodium chloride 0.9 % 100 mL IVPB        2 g 200 mL/hr over 30 Minutes Intravenous Every 24 hours 08/07/23 1432 08/17/23 1559   08/07/23 1200  vancomycin (VANCOCIN) capsule 125 mg  Status:  Discontinued        125 mg Oral 4 times daily 08/07/23 0946 08/09/23 1239   08/07/23 0930  vancomycin (VANCOCIN) 50 mg/mL oral solution SOLN 125 mg  Status:  Discontinued        125 mg Per Tube 4 times daily 08/07/23 0908 08/07/23 0946   08/05/23 1315  cefTRIAXone (ROCEPHIN) 2 g in sodium chloride 0.9 % 100 mL IVPB        2 g 200 mL/hr over 30 Minutes Intravenous  Once 08/05/23 1308 08/05/23 1620   08/05/23 0800  cephALEXin (KEFLEX) capsule 250 mg  Status:  Discontinued        250 mg Oral Daily 08/04/23 1916 08/07/23 1432    08/05/23 0000  cephALEXin (KEFLEX) 250 MG capsule        250 mg Oral Daily 08/05/23 0657     07/24/23 0600  cefTRIAXone (ROCEPHIN) 2 g in sodium chloride 0.9 % 100 mL IVPB        2 g 200 mL/hr over 30 Minutes Intravenous 30 min pre-op 07/23/23 1600 07/24/23 0701   07/20/23 0800  cephALEXin (KEFLEX) capsule 250 mg        250 mg Oral 2 times daily 07/17/23 0938 07/25/23 0759      Subjective: Today, patient was seen and examined at bedside.  Patient decided he was little dizzy while sitting up and standing but no nausea vomiting.  Has had low-volume diarrhea.  Overall feels stronger.    Objective: Vitals:   08/12/23 2123 08/13/23 0626  BP: 117/65 122/81  Pulse: 67 66  Resp: 18 18  Temp: 97.7 F (36.5 C) (!) 97.3 F (36.3 C)  SpO2: 100% 100%    Intake/Output Summary (Last 24 hours) at 08/13/2023 1330 Last data filed at 08/13/2023 0845 Gross per 24 hour  Intake 540 ml  Output 800 ml  Net -260 ml  Filed Weights   08/11/23 0500 08/12/23 0500 08/12/23 2111  Weight: 100.7 kg 99.4 kg 99.2 kg   Body mass index is 32.3 kg/m.   Physical Exam:  GENERAL: Patient is alert awake and mildly Communicative, not in obvious distress.  Obese.  Sitting up, on the chair. HENT: No scleral pallor or icterus. Pupils equally reactive to light. Oral mucosa is moist NECK: is supple, no gross swelling noted. CHEST:  Diminished breath sounds bilaterally. CVS: S1 and S2 heard, no murmur.  Irregular rhythm. ABDOMEN: Soft, nontender, no rigidity guarding or tenderness.  Bowel sounds are present.  Foley catheter in place with dark urine EXTREMITIES: Bilateral pitting edema noted. CNS: Cranial nerves are intact. No focal motor deficits. SKIN: warm and dry, pressure injury bilateral buttocks, present on admission.  Data Review: I have personally reviewed the following laboratory data and studies,  CBC: Recent Labs  Lab 08/09/23 1201 08/10/23 0521 08/11/23 0505 08/12/23 0727 08/13/23 0539  WBC  58.2* 53.2* 52.1* 40.4* 27.8*  HGB 9.4* 8.7* 9.4* 9.9* 8.5*  HCT 30.4* 26.9* 29.6* 30.8* 26.9*  MCV 84.4 83.0 83.4 82.4 83.3  PLT 309 310 289 326 300   Basic Metabolic Panel: Recent Labs  Lab 08/07/23 0835 08/08/23 0854 08/10/23 0521 08/11/23 0505 08/12/23 0727 08/12/23 1628 08/13/23 0539  NA 132*   < > 131* 131* 130* 133* 132*  K 4.6   < > 3.5 3.1* 3.3* 3.6 3.6  CL 110   < > 105 104 101 103 103  CO2 13*   < > 14* 12* 13* 17* 15*  GLUCOSE 137*   < > 96 92 98 116* 101*  BUN 85*   < > 81* 79* 77* 75* 75*  CREATININE 5.14*   < > 5.39* 5.32* 5.32* 5.61* 5.55*  CALCIUM 7.9*   < > 7.9* 7.9* 7.7* 8.0* 7.7*  MG 2.1  --   --  1.6* 2.1  --   --   PHOS  --   --   --   --  7.3*  --   --    < > = values in this interval not displayed.   Liver Function Tests: No results for input(s): "AST", "ALT", "ALKPHOS", "BILITOT", "PROT", "ALBUMIN" in the last 168 hours.  No results for input(s): "LIPASE", "AMYLASE" in the last 168 hours. No results for input(s): "AMMONIA" in the last 168 hours. Cardiac Enzymes: No results for input(s): "CKTOTAL", "CKMB", "CKMBINDEX", "TROPONINI" in the last 168 hours. BNP (last 3 results) No results for input(s): "BNP" in the last 8760 hours.  ProBNP (last 3 results) No results for input(s): "PROBNP" in the last 8760 hours.  CBG: Recent Labs  Lab 08/11/23 0614 08/12/23 0726 08/12/23 1142 08/12/23 1618 08/13/23 0600  GLUCAP 90 93 98 117* 99   Recent Results (from the past 240 hours)  Urine Culture (for pregnant, neutropenic or urologic patients or patients with an indwelling urinary catheter)     Status: Abnormal   Collection Time: 08/04/23  2:08 PM   Specimen: Urine, Catheterized  Result Value Ref Range Status   Specimen Description URINE, CATHETERIZED  Final   Special Requests   Final    NONE Performed at [facility], [address], [place], [place] [address]    Culture >=100,000 COLONIES/mL YEAST (A)  Final   Report Status 08/05/2023  FINAL  Final     Studies: CT RENAL STONE STUDY Result Date: 08/12/2023 CLINICAL DATA:  Stone burden eval, post treatment EXAM: CT  ABDOMEN AND PELVIS WITHOUT CONTRAST TECHNIQUE: Multidetector CT imaging of the abdomen and pelvis was performed following the standard protocol without IV contrast. RADIATION DOSE REDUCTION: This exam was performed according to the departmental dose-optimization program which includes automated exposure control, adjustment of the mA and/or kV according to patient size and/or use of iterative reconstruction technique. COMPARISON:  CT abdomen pelvis 08/05/2023 FINDINGS: Lower chest: Bilateral lower lobe atelectasis. Hepatobiliary: No focal liver abnormality. Calcified gallstone noted within the gallbladder lumen. No gallbladder wall thickening or pericholecystic fluid. No biliary dilatation. Pancreas: Diffusely atrophic. No focal lesion. Otherwise normal pancreatic contour. No surrounding inflammatory changes. No main pancreatic ductal dilatation. Spleen: Normal in size without focal abnormality. Adrenals/Urinary Tract: No adrenal nodule bilaterally. Bilateral ureteral stents in appropriate position with the proximal pigtail of the left stent within a left superior renal pole calyx. Associated focal calyceal dilatation adjacent to this. Left nephrolithiasis measuring up to 1.7 cm. A 0.6 cm right distal ureterolithiasis. Associated moderate right hydroureter. No ureterolithiasis or hydroureter. The urinary bladder is decompressed with Foley catheter tip within the urinary bladder lumen. Inflated Foley catheter balloon not visualized. Stomach/Bowel: Stomach is within normal limits. No evidence of small bowel wall thickening or dilatation. Interval development of diffuse large bowel wall thickening and pericolonic fat stranding. Appendix appears normal. Vascular/Lymphatic: No abdominal aorta or iliac aneurysm. Severe atherosclerotic plaque of the aorta and its branches. No abdominal,  pelvic, or inguinal lymphadenopathy. Reproductive: Prostate is unremarkable. Other: Small volume simple intraperitoneal free fluid. No intraperitoneal free gas. No organized fluid collection. Musculoskeletal: Diffuse subcutaneus soft tissue edema. No suspicious lytic or blastic osseous lesions. No acute displaced fracture. Multilevel severe degenerative changes of the spine. IMPRESSION: 1. Pancolitis. 2. Small volume simple free fluid ascites. 3. Left nephrolithiasis measuring up to 1.7 cm. 4. Obstructive 0.6 cm right distal ureterolithiasis adjacent to the ureteral stent. 5. Bilateral ureteral stents. 6. Cholelithiasis no evidence of acute cholecystitis. 7.  Aortic Atherosclerosis (ICD10-I70.0). Electronically Signed   By: Tish Frederickson M.D.   On: 08/12/2023 00:41       Joycelyn Das, MD  [facility] 08/13/2023  If 7PM-7AM, please contact night-coverage

## 2023-08-13 NOTE — Progress Notes (Signed)
 In anticipation of the patient remaining in hospital at the proposed time for definitive stone management, we have been able to move his case over to [facility] at 1:30 PM on the 15th.

## 2023-08-13 NOTE — Progress Notes (Signed)
 [facility] Progress Note   Subjective:  Cr inching upwards.  Urology following- no plans for IP procedure per wife.  Stents appear to be draining around kidney stone.  Some sediment in catheter.  Per wife, doesn't seem to be draining as well. Objective Vitals:   08/12/23 1321 08/12/23 2111 08/12/23 2123 08/13/23 0626  BP: 119/70  117/65 122/81  Pulse: 68  67 66  Resp: 17  18 18   Temp: 99.1 F (37.3 C)  97.7 F (36.5 C) (!) 97.3 F (36.3 C)  TempSrc: Oral     SpO2: 100%  100% 100%  Weight:  99.2 kg    Height:       Physical Exam General: sitting in chair Heart: RRR Lungs: clear Abdomen: soft Extremities: 3+ L tibial edema 3+ R tibial, LUE 3+ RUE 2+ --> slightly improved GU:  foley draining milky colored brownish urine  Additional Objective Labs: Basic Metabolic Panel: Recent Labs  Lab 08/12/23 0727 08/12/23 1628 08/13/23 0539  NA 130* 133* 132*  K 3.3* 3.6 3.6  CL 101 103 103  CO2 13* 17* 15*  GLUCOSE 98 116* 101*  BUN 77* 75* 75*  CREATININE 5.32* 5.61* 5.55*  CALCIUM 7.7* 8.0* 7.7*  PHOS 7.3*  --   --    Liver Function Tests: No results for input(s): "AST", "ALT", "ALKPHOS", "BILITOT", "PROT", "ALBUMIN" in the last 168 hours.  No results for input(s): "LIPASE", "AMYLASE" in the last 168 hours. CBC: Recent Labs  Lab 08/09/23 1201 08/10/23 0521 08/11/23 0505 08/12/23 0727 08/13/23 0539  WBC 58.2* 53.2* 52.1* 40.4* 27.8*  HGB 9.4* 8.7* 9.4* 9.9* 8.5*  HCT 30.4* 26.9* 29.6* 30.8* 26.9*  MCV 84.4 83.0 83.4 82.4 83.3  PLT 309 310 289 326 300   Blood Culture    Component Value Date/Time   SDES URINE, CATHETERIZED 08/04/2023 1408   SPECREQUEST  08/04/2023 1408    NONE Performed at [facility], [address], [place], [place] [address]    CULT >=100,000 COLONIES/mL YEAST (A) 08/04/2023 1408   REPTSTATUS 08/05/2023 FINAL 08/04/2023 1408    Cardiac Enzymes: No results for input(s): "CKTOTAL", "CKMB", "CKMBINDEX", "TROPONINI"  in the last 168 hours. CBG: Recent Labs  Lab 08/11/23 0614 08/12/23 0726 08/12/23 1142 08/12/23 1618 08/13/23 0600  GLUCAP 90 93 98 117* 99   Iron Studies: No results for input(s): "IRON", "TIBC", "TRANSFERRIN", "FERRITIN" in the last 72 hours. @lablastinr3 @ Studies/Results: CT RENAL STONE STUDY Result Date: 08/12/2023 CLINICAL DATA:  Stone burden eval, post treatment EXAM: CT ABDOMEN AND PELVIS WITHOUT CONTRAST TECHNIQUE: Multidetector CT imaging of the abdomen and pelvis was performed following the standard protocol without IV contrast. RADIATION DOSE REDUCTION: This exam was performed according to the departmental dose-optimization program which includes automated exposure control, adjustment of the mA and/or kV according to patient size and/or use of iterative reconstruction technique. COMPARISON:  CT abdomen pelvis 08/05/2023 FINDINGS: Lower chest: Bilateral lower lobe atelectasis. Hepatobiliary: No focal liver abnormality. Calcified gallstone noted within the gallbladder lumen. No gallbladder wall thickening or pericholecystic fluid. No biliary dilatation. Pancreas: Diffusely atrophic. No focal lesion. Otherwise normal pancreatic contour. No surrounding inflammatory changes. No main pancreatic ductal dilatation. Spleen: Normal in size without focal abnormality. Adrenals/Urinary Tract: No adrenal nodule bilaterally. Bilateral ureteral stents in appropriate position with the proximal pigtail of the left stent within a left superior renal pole calyx. Associated focal calyceal dilatation adjacent to this. Left nephrolithiasis measuring up to 1.7 cm. A 0.6 cm right distal  ureterolithiasis. Associated moderate right hydroureter. No ureterolithiasis or hydroureter. The urinary bladder is decompressed with Foley catheter tip within the urinary bladder lumen. Inflated Foley catheter balloon not visualized. Stomach/Bowel: Stomach is within normal limits. No evidence of small bowel wall thickening or  dilatation. Interval development of diffuse large bowel wall thickening and pericolonic fat stranding. Appendix appears normal. Vascular/Lymphatic: No abdominal aorta or iliac aneurysm. Severe atherosclerotic plaque of the aorta and its branches. No abdominal, pelvic, or inguinal lymphadenopathy. Reproductive: Prostate is unremarkable. Other: Small volume simple intraperitoneal free fluid. No intraperitoneal free gas. No organized fluid collection. Musculoskeletal: Diffuse subcutaneus soft tissue edema. No suspicious lytic or blastic osseous lesions. No acute displaced fracture. Multilevel severe degenerative changes of the spine. IMPRESSION: 1. Pancolitis. 2. Small volume simple free fluid ascites. 3. Left nephrolithiasis measuring up to 1.7 cm. 4. Obstructive 0.6 cm right distal ureterolithiasis adjacent to the ureteral stent. 5. Bilateral ureteral stents. 6. Cholelithiasis no evidence of acute cholecystitis. 7.  Aortic Atherosclerosis (ICD10-I70.0). Electronically Signed   By: Tish Frederickson M.D.   On: 08/12/2023 00:41    Medications:  ceFEPime (MAXIPIME) IV Stopped (08/12/23 1643)      Chlorhexidine Gluconate Cloth  6 each Topical BID   darbepoetin (ARANESP) injection - NON-DIALYSIS  40 mcg Subcutaneous Q Fri-1800   diclofenac Sodium  2 g Topical QID   fidaxomicin  200 mg Oral BID   iron polysaccharides  150 mg Oral Daily   multivitamin  1 tablet Oral QHS   Muscle Rub   Topical TID   psyllium  1 packet Oral BID   sodium bicarbonate  1,300 mg Oral TID   tamsulosin  0.8 mg Oral QPC supper   traMADol  50 mg Oral BID   traZODone  75 mg Oral QHS    Assessment/Plan:72yo M A fib, DM, A fib on eliquis, nephrolithiasis s/p recent PCN for staghorn calculus currently admitted to CIR for debility and nephrology is reconsulted for AKI on CKD.  **AKI on CKD 4:   Baseline kidney function 05/2023 Cr 4.1 - 5.3 f/b CKA.  After CRRT stopped his creatinine improved to nadir of 3.2 on 3/19 and has gradually  trended up 3/27 3.6 > 3/31 4.2 > 4/1 4.45 . Had staghorn calculus on L s/p PCN. Found to have obstructing stone on R s/p OR with ureteral stent 4/2.  Cr trending up further 5.14 > 5.4 4/6 - pending still today.  Remains nonoliguric with no uremic symptoms; no RRT needed at this time but discussed need may arise in coming days.  Give albumin 25g x 2 and lasix 60 x 2 again today and holding IVF.  Daily labs for now.  Strict I/Os.  Avoid hypotension and nephrotoxins.     - will do CT scan today to rule out any other obstructive insults  - may need to consider RRT in the coming days if nothing reversible identified.  - appreciate urology input  - CT scan 08/11/23 showing R obstructing nephrolithasis but stents appear to be draining around the obstructive stone per urology  - will remove and replace Foley today  **Anemia, normocytic:  Transfuse as needed for Hb < 7.  Having acute blood loss through foley but H/H stable.  Eliquis has been held.  On aranesp.   **Metabolic acidosis:  worsening in context of AKI; in light of worsening overload d/cd bicarb gtt and switched to oral. Serum bicarb slowly improving.   **A fib:  eliquis on hold for acute bleeding  **  Orthostatic hypotension:  has been on midodrine for the last few weeks, BPs look ok currently.   **Debility:  on CIR  Will follow closely with you, please reach out with concerns.   Bufford Buttner MD 08/13/2023, 12:40 PM  [facility] Pager: [phone]

## 2023-08-13 NOTE — Progress Notes (Signed)
 Patient ID: Calvin Schultz, male   DOB: 10-Dec-1950, 73 y.o.   MRN: [id number]  Met with pt and wife who is here to give update on insurance wanting a peer to peer to justify him staying here. MD is aware of this, just received the paperwork although date says 4/5. MD does have 14 days to appeal according to the paperwork. Wife and MD report the procedure will be postponed and pt can focus on his rehab and preparing for home. Aware will need to come back as an OP for the procedure. Will await MD appeal

## 2023-08-13 NOTE — Progress Notes (Signed)
 PROGRESS NOTE   Subjective/Complaints: Bms continue to be loose but a little less frequent.   Later in day reported to have some intermittent lightheadedness, wearing TED hose.  Urine more dark brown urine today.   Urology evaluated him today while I was in the room as well.  ROS: Patient denies fever, rash, sore throat, blurred vision, dizziness,  vomiting,   cough, shortness of breath or chest pain, headache, or mood change.  + diarrhea and fatigue- a little improved today + poor appetite - a little better     Objective:   CT RENAL STONE STUDY Result Date: 08/12/2023 CLINICAL DATA:  Stone burden eval, post treatment EXAM: CT ABDOMEN AND PELVIS WITHOUT CONTRAST TECHNIQUE: Multidetector CT imaging of the abdomen and pelvis was performed following the standard protocol without IV contrast. RADIATION DOSE REDUCTION: This exam was performed according to the departmental dose-optimization program which includes automated exposure control, adjustment of the mA and/or kV according to patient size and/or use of iterative reconstruction technique. COMPARISON:  CT abdomen pelvis 08/05/2023 FINDINGS: Lower chest: Bilateral lower lobe atelectasis. Hepatobiliary: No focal liver abnormality. Calcified gallstone noted within the gallbladder lumen. No gallbladder wall thickening or pericholecystic fluid. No biliary dilatation. Pancreas: Diffusely atrophic. No focal lesion. Otherwise normal pancreatic contour. No surrounding inflammatory changes. No main pancreatic ductal dilatation. Spleen: Normal in size without focal abnormality. Adrenals/Urinary Tract: No adrenal nodule bilaterally. Bilateral ureteral stents in appropriate position with the proximal pigtail of the left stent within a left superior renal pole calyx. Associated focal calyceal dilatation adjacent to this. Left nephrolithiasis measuring up to 1.7 cm. A 0.6 cm right distal  ureterolithiasis. Associated moderate right hydroureter. No ureterolithiasis or hydroureter. The urinary bladder is decompressed with Foley catheter tip within the urinary bladder lumen. Inflated Foley catheter balloon not visualized. Stomach/Bowel: Stomach is within normal limits. No evidence of small bowel wall thickening or dilatation. Interval development of diffuse large bowel wall thickening and pericolonic fat stranding. Appendix appears normal. Vascular/Lymphatic: No abdominal aorta or iliac aneurysm. Severe atherosclerotic plaque of the aorta and its branches. No abdominal, pelvic, or inguinal lymphadenopathy. Reproductive: Prostate is unremarkable. Other: Small volume simple intraperitoneal free fluid. No intraperitoneal free gas. No organized fluid collection. Musculoskeletal: Diffuse subcutaneus soft tissue edema. No suspicious lytic or blastic osseous lesions. No acute displaced fracture. Multilevel severe degenerative changes of the spine. IMPRESSION: 1. Pancolitis. 2. Small volume simple free fluid ascites. 3. Left nephrolithiasis measuring up to 1.7 cm. 4. Obstructive 0.6 cm right distal ureterolithiasis adjacent to the ureteral stent. 5. Bilateral ureteral stents. 6. Cholelithiasis no evidence of acute cholecystitis. 7.  Aortic Atherosclerosis (ICD10-I70.0). Electronically Signed   By: Tish Frederickson M.D.   On: 08/12/2023 00:41       Recent Labs    08/12/23 0727 08/13/23 0539  WBC 40.4* 27.8*  HGB 9.9* 8.5*  HCT 30.8* 26.9*  PLT 326 300     Recent Labs    08/12/23 1628 08/13/23 0539  NA 133* 132*  K 3.6 3.6  CL 103 103  CO2 17* 15*  GLUCOSE 116* 101*  BUN 75* 75*  CREATININE 5.61* 5.55*  CALCIUM 8.0* 7.7*  Intake/Output Summary (Last 24 hours) at 08/13/2023 1236 Last data filed at 08/13/2023 0845 Gross per 24 hour  Intake 660 ml  Output 800 ml  Net -140 ml         Physical Exam: Vital Signs Blood pressure 122/81, pulse 66, temperature (!) 97.3 F  (36.3 C), resp. rate 18, height 5\' 9"  (1.753 m), weight 99.2 kg, SpO2 100%.  Constitutional: No distress . Vital signs reviewed. Sitting in power WC HEENT: NCAT, EOMI, oral membranes moist Neck: supple Cardiovascular: RRR without murmur. No JVD    Respiratory/Chest: CTA Bilaterally without wheezes or rales. Normal effort    GI/Abdomen: BS +,  tender, non-distended, tender to palp Ext: no clubbing, cyanosis, 2-3+ LE edema and UE edema Psych: pleasant and cooperative  GU: Foley with brown colored urine in bag    Skin: No evidence of breakdown, no evidence of rash Neurologic: Cranial nerves II through XII grossly intact, motor strength is moving all 4 extremities to gravity and resistance    MSK: Bilateral knee effusions ongoing. Less pain with ROM. Knee sleeves in place Left shoulder tight in ER/IR/F/E            Assessment/Plan: 1. Functional deficits which require 3+ hours per day of interdisciplinary therapy in a comprehensive inpatient rehab setting. Physiatrist is providing close team supervision and 24 hour management of active medical problems listed below. Physiatrist and rehab team continue to assess barriers to discharge/monitor patient progress toward functional and medical goals  Care Tool:  Bathing  Bathing activity did not occur:  (patient completed a simulated task at bed LOF) Body parts bathed by patient: Right arm, Left arm, Chest, Abdomen, Front perineal area, Right upper leg, Left upper leg, Face   Body parts bathed by helper: Buttocks, Left lower leg, Right lower leg     Bathing assist Assist Level: Maximal Assistance - Patient 24 - 49%     Upper Body Dressing/Undressing Upper body dressing   What is the patient wearing?: Pull over shirt    Upper body assist Assist Level: Contact Guard/Touching assist    Lower Body Dressing/Undressing Lower body dressing      What is the patient wearing?: Pants, Underwear/pull up, Incontinence brief     Lower  body assist Assist for lower body dressing: Maximal Assistance - Patient 25 - 49%     Toileting Toileting    Toileting assist Assist for toileting: 2 Helpers     Transfers Chair/bed transfer  Transfers assist  Chair/bed transfer activity did not occur: Safety/medical concerns (not safe to get up)  Chair/bed transfer assist level: Maximal Assistance - Patient 25 - 49%     Locomotion Ambulation   Ambulation assist   Ambulation activity did not occur: Safety/medical concerns  Assist level: Dependent - Patient 0% Assistive device: Other (comment) (sara plus) Max distance: 18 steps   Walk 10 feet activity   Assist  Walk 10 feet activity did not occur: Safety/medical concerns        Walk 50 feet activity   Assist Walk 50 feet with 2 turns activity did not occur: Safety/medical concerns         Walk 150 feet activity   Assist Walk 150 feet activity did not occur: Safety/medical concerns         Walk 10 feet on uneven surface  activity   Assist Walk 10 feet on uneven surfaces activity did not occur: Safety/medical concerns         Wheelchair  Assist Is the patient using a wheelchair?: Yes Type of Wheelchair: Power    Wheelchair assist level: Supervision/Verbal cueing Max wheelchair distance: 300+    Wheelchair 50 feet with 2 turns activity    Assist        Assist Level: Supervision/Verbal cueing   Wheelchair 150 feet activity     Assist      Assist Level: Supervision/Verbal cueing   Blood pressure 122/81, pulse 66, temperature (!) 97.3 F (36.3 C), resp. rate 18, height 5\' 9"  (1.753 m), weight 99.2 kg, SpO2 100%.   Medical Problem List and Plan: 1. Functional deficits secondary to debility related to septic shock/C. difficile colitis/AKI superimposed on CKD stage IV             -patient may shower with tubes/drains covered             -ELOS/Goals:  3/28, min assist PT/OT             -Continue CIR therapies  including PT, OT   -Team meeting scheduled for Tuesday  -family/ team meeting  Tuesday at 1 PM.  -Daily therapy for now  -Family meeting completed yesterday  -Will call insurance peer to peer  -Exp dc 4/18 currently    2.  Antithrombotics: -DVT/anticoagulation:  Pharmaceutical: Eliquis             -antiplatelet therapy: N/A   3. Pain Management: tramadol prn, Zanaflex 4 mg twice daily as needed   Biliateral OA of knees  -ordered bilateral neoprine knee sleeves --try today  -voltaren gel tid to knees  -have scheduled tramadol 50mg  at 0700 and 1200 daily. Continue prn as well  Mild adhesive capsulitis left shoulder---  sports cream. Can use voltaren gel also. Aggressive ROM with therapy and while in bed--continue  -Will check right knee x-ray, left knee x-ray on 3/8 with advanced OA.  Will consult orthopedics for cortisone injections  -3-28: Orthopedics aspirated and injected left knee, with improvement.  Ongoing pain with right knee.  3/31 Knee fluid calcium pyrophosphate cyrystals, pseudogout?, cultures with no growth  -4/2 hydrocodone PRN started yesterday for flank pain  4/3 reconsult orthopedics, will come up and talk to him today about his right knee.  I had spoken with orthopedics on 4/1 are considering oral steroids due to crystals, pseudogout?  But held off due to more acute medical issues  4/4 right knee is feeling better continue to monitor  4/5 continues to be improved although has not had therapy today  4/6-10 reports knee pain is better    4. Mood/Behavior/Sleep: Trazodone 50 mg nightly as needed.  Provide emotional support             -antipsychotic agents: N/A   5. Neuropsych/cognition: This patient is capable of making decisions on his own behalf.   6. Skin/Wound Care: Routine skin checks   7. Fluids/Electrolytes/Nutrition:    -3/14 po intake remains poor, albumin low, weights falling  -will ask RD for assistance. Have spoken to patient about intake  -?IV  albumin (see below)  3/18 discussed with dietitian, patient recorded is eating 0% of his meals.  Appears that wife bringing in 3 meals a day and he is eating about 75%, appears to have fairly adequate intake.  3/27 eating frequent outside foods-doesn't like hospital food as much  4/7 encouraged pt to eat any food he feels like. More important to just get calories in at this point  4/9 pt and wife report he was able to  eat little bit more  8.  Oliguric AKI superimposed on CKD stage IV.  Baseline creatinine 4.9.  CRRT discontinued 2/25.  No current plan for long-term dialysis.  Follow-up renal services              - Per discussion with nephrology Dr. Verna Czech, keep Foley catheter in to allow accurate I's and O's through Monday; then, can remove and do DC Foley trial while continuing to document strict outputs             - Continue daily assessments of renal function per nephrology   3/8- per renal, Cr leveling out- and con't Foley- will remove Monday  -nephrology following, Cr remains in 4's.   3/14 renal function with some improvement today     -suspect lower extremity edema is related to renal function/low albumin   Appreciate nephro input   -3/17 BUN/creatinine slightly improved 34/3.31, appreciate nephrology assistance  3/19 BUN/CR stable at 34/3.24, nephrology signing off, ok to check BMET twice a week  3/23 encouraging po this weekend. happy that he's eating food from home  3/24 BUN/CR overall stable at 40/3.26  -3/31 BUN/Cr a little higher today, IVF 56ml/hr  -4/1 Cr a little higher again, will contact nephrology  -4/3 nephrology note reviewed, IV Lasix discontinued and he was given some Lasix today, changed oral bicarb by nephrology, can give IV as needed  4/4 patient has a lot of edema, nephrology reducing Lasix today and holding IVF creatinine higher at 5.14  4/5 reviewed nephrology note, patient getting albumin and Lasix today  4/7 labs reviewed, similar to yesterday, ongoing mgt  per nephro  4/8 Cr stable for past few days, CT ordered by nephrology to r/o other obstructive issue  4/9 discussed CT scan results with urology by phone, no interventions recommended at this time. Cr stable. Will await formal note for recommendations.   4/10 nephrology has ordered removal/replace foley, may need RRT 9.  C. difficile colitis.  Completed course of oral vancomycin 3/7.  DC'd IV Flagyl 3/3. Enteric precautions   3/14 stools formed this morning   3/17 discussed with ID pharmacy, patient having more frequent bowel movements.  Monitor today and if continues consider Dificid treatment  3/18 diarrhea appears to be improving, continue to monitor for now  3/19 Frequent Bms improved, continue to follow  3/21 having more frequent bowel movements again today, continue to monitor and if frequent diarrhea persists this weekend consider treatment with Dificid  3/23- mushy/lqiuid stool again overnight    -no dificid at this point. Discuss with GI first on Monday    -added fiber to regimen yesterday, will increase to bid today  3/24 mushy/ liquid BMs, about 2 a day yesterday, more other days- will discuss GI  3/25 GI recommended repeat C. difficile screening. Discussed with [facility], hold off on repeat screening test, he only had about 2 bowel movements a day but if continues to have frequent BMs consider retreatment with oral vancomycin for 10 days  3/26 pt reports Bms more firm, continue to monitor   3/28 patient reports BMs have been more solid, continue to monitor.  If worsening diarrhea occurs consider retreatment with vancomycin oral for 10 days  3-29: Diarrhea this a.m., no bowel movements this afternoon recorded  3-30: No BM since yesterday.  3-31 diarrhea continues to be improved overall  4-3 LBM yesterday, reports diarrhea continues to be improved  -4/4 patient with increased diarrhea, discussed with hospitalist and ID.  Restarted oral  vancomycin 125 mg 4 times a day for 10  days  -4/5 continue vancomycin oral, hospitalist following appreciate assistance  -4/6 discussed with ID today, will change to Dificid  -4/7 WBC's sl improved to 53k today. Continue fidaxomicin, fiber,etc   -appreciate ID f/u  4/8 WBC a little better today 52.1, continue to monitor. Still having frequent Bms  4/9 WBC improved to 40.4, continue to monitor. Diarrhea a little better  4/10 WBC decreased 27. Much improved, continue current regimen 10.  Status post left nephrostomy tube d/t staghorn calculi. Has Foley Nephrostomy tube placed 1/29 per IR with plan for ureteric stent placement 3/21              - Careful monitoring of nephrostomy output  -3/14 flomax has been added to improve emptying  -3/21 patient scheduled today for cystoscopy with left retrograde pyelogram, left ureteroscopy laser lithotripsy and left ureteral stent placement.     3/22- procedure appears successful thus far.    -urine clear, patient comfortable   -foley to come out on Monday per urology  3/25 Foley was removed patient continues to have urinary retention requiring IC.  Increase Flomax to 0.8 mg  3/26 intermittent blood noted with IC, if continues will restart foley  3/27 Urine no longer blood tinged, continue current regimen, consider restart foley if reoccurs  3/27 continues to require catheterization but no longer blood-tinged Addendum 3/28 foley started after traumatic cath, possible false passage, continue foley for now, f/u with urology outpatient 4/1 foley with hematuria, denies known trauma to this, will ask nursing to check bladder scan. U/A culture ordered.  Called his urologist to discuss 4/2 CT abdomen/pelvis-bilateral obstructive uropathy with collecting system hematuria, large calculus in right ureter and pelvic inlet, new left hydronephrosis, new anasarca, liver nodules raising possibility of cirrhosis.  Discussed patient with urology - surgical procedure today  Addendum discussed with urology, will  hold eliquis for now due to bleeding, check EKG- A flutter -4/3 cystoscopy, right retrograde pyelogram, right ureteral stent insertion yesterday by Dr. Pete Glatter, further procedure scheduled on 4/15 with Dr. Mena Goes.  Discussed with [facility] urology.  Do not think traumatic cath last week caused recurrent bleeding his kidney, his wife was concerned about this.  Patient was having nonbloody urine for several days on the Foley.  Will continue Foley and not attempt intermittent cath.  Patient suspected to have false passage. 4/4 urine appears less dark today, urology considering TXA tomorrow if bleeding is not resolved, Eliquis on hold 4/5 appreciate urology assistance, bleeding does appear to be improving, only blood-tinged urine in Foley tubing.  Consider restart Eliquis when 4/6 Discussed with urology,  bleeding improving, hold off on TXA, continue to hold eliquis for today due to high risk of bleeding 4/7- urine appears to be clearing although hgb down to 8.7 today  -continue serial labs  -don' think TXA is indicated right now  -would keep eliquis on hold 4/8 Continue to hold eliques, could consider heparin trail- discuss with hospitalist- CT scan results before considering 4/9 Will check EKG to see if still having afib/flutter, consider heparin infusion, will hold off today as urine looks a little pink/blood tinged 4/10 Dark urine today, Pt was seen by urology, continue to monitor, foley to be replaced  11.  Chronic atrial fibrillation with episodes of bradycardia.  Followed by cardiology services.  Continue Eliquis.  Avoid AV nodal blocking agents -Cardiology consulted for Aflutter   12.  Diabetes mellitus.  Hemoglobin A1c 6.2.    -  tightly controlled. Dc SSI and change cbg checks to qam only   CBG (last 3)  Recent Labs    08/12/23 1142 08/12/23 1618 08/13/23 0600  GLUCAP 98 117* 99       3-30: Recently increased.  Somewhat expected with intra-articular steroid injection.   Continue to trend.  4/5-7 controlled, continue to monitor  4/8 controlled, continue to monitor   13.  Hypotension.  ProAmatine 10 mg 3 times daily.  Monitor with increased mobility   - Normotensive on admission  3/9- BP running 100-s to 120s systolic- con't regimen  3/19 BP has been on high side, will change midodrine to 5mg  TID PRN orthostatic hypotension  3/28 BP stable, continue current regimen  -4/3-10 Mild dizziness with therapy     08/13/2023    6:26 AM 08/12/2023    9:23 PM 08/12/2023    9:11 PM  Vitals with BMI  Weight   218 lbs 11 oz  BMI   32.28  Systolic 122 117   Diastolic 81 65   Pulse 66 67     14.  Chronic anemia.  Niferex daily/Aranesp.  Monitor for any bleeding episodes   3/8- Hb 7.1 today- will order transfusion- transfusion of 1 unit pRBCs.   3/14 hgb up to 9.1  -3/17 hemoglobin down to 8.4, continue to monitor  -3/19 HGB 8.3 today, stable overall, continue to monitor    -3/27 HGB 7.7- a little lower likely due to GU procedure/hematuria, continue to monitor  3/31 HGB 7.6 today, transfuse 7 or less  4/7 hgb down to 8.7. hematuria better--continue to monitor serially   16. Leukocytosis  3/9- Pt's WBC is 21.7- up from 16k- is afebrile- due to his recent C diff, that just finished correct dosing of ABX for, called ID for guidance on treatment- don't want to cause more Cdiff- ID consulted ---no new orders  3/10 WBC's down to 17k  --have been hovering above and below 20 for awhile. No new clinical symptoms. Nephrostomy drainage clear, yellow  3/14 wbc's down to 13k.--recheck Monday   3/17 WBC is down to 12.4  3/19 WBC 13.4 today, continue to monitor  3/24 WBC higher today 18.7, Discuss C diff treatment with GI a above 3/31 WBC still elevated 19.0, monitor for signs of infection 4/3 WBC still elevated to 23.8, potentially due to his urological issues and procedure.  Hospitalist also following appreciate assistance  4/4 WBC higher today, oral vancomycin started as  above.  Discussed with ID Patrecia Pace and hospitalist.  Patient had Pseudomonas resulting in prior urine culture.  Recommended cefepime for approximately 10 days depending on WBC.  Called pharmacy for cefepime dosing.  ID recommended replacing Foley due to yeast on last culture, discussed with urology who indicates this was already done so hold off on changing again.  4/5 continue current antibiotic regimen, hospitalist team following.  WBC higher today-discussed with hospitalist yesterday, often see higher WBCs with C. difficile.  Continue monitor trend daily.  Patient remains afebrile and vital stable  4/7 WBC's 53k, sl improvement   -continue fidaxomicin   -appreciate ID help   -serial labs  4/10 WBC improved to 27  17. Decreased mood reported by therapy  -Consider SSRI  -3/20 patient reports his mood is okay, denies depression this morning.  Continue to monitor  18. Insomnia   -3/20 schedule trazodone and increase to 75mg  at bedtime  -3/21 insomnia improved but bowel movements at night did wake him up a few times  3/23 sleeping better when stooling isn't an issue  19. LE edema  -Significantly improved on the left after steroid injection and effusion tap.  -IVF discontinued as above  - Nephrology following, treating with Lasix, albumin  -4/7 edema with some improvement. albumin /lasix per nephro   -elevate/compression   -improve nutrition  20. Low MG and potassium  -4/8 mg and K+ repletion today  -4/9 addition K+ was ordered  4/10 K+ up to 3.6 LOS: 34 days A FACE TO FACE EVALUATION WAS PERFORMED  Fanny Dance 08/13/2023, 12:36 PM

## 2023-08-13 NOTE — Progress Notes (Signed)
 Physical Therapy Session Note  Patient Details  Name: Calvin Schultz MRN: [id number] Date of Birth: [birth year]  Today's Date: 08/13/2023 PT Individual Time: 1478-2956, 1335-1415 PT Individual Time Calculation (min): 60 min, 40 min   Short Term Goals: Week 5:  PT Short Term Goal 1 (Week 5): STG=LTG 2/2 ELOS  Skilled Therapeutic Interventions/Progress Updates:      Treatment Session 1  Pt supine in bed upon arrival. Pt agreeable to therapy. Pt denies any pain.   Pt performed rolling B with min A, pt wife donning pants, education provided to pt wife to raise bed to her waist height for improved body mechanics and strain on back.   Pt reports lightheadedness with supine to sit, assessed BP 99/64, HR 67. Pt wearing ted hose, pt reports he has been drinking fluids. Notified nurse and MD.   Pt wife postioned WC with supervision, verbal cues provided to ensure chair is in neutral tilt position prior to placing slide board.   Pt wife placed board with supervision, verbal cues provided for placement to allow slide board to be flat versus tilted forward.   Pt performed slide board transfer with +time with  pt wife providing min A utilizing chuck pad between pt and slide board . Pt wife appropriately cuing pt throughout for technique and sequencing.   Pt wife able to provide teach back of functions of power controls and used each appropriately to assist pt with posterior scoot in power WC.   Pt performed sit to stand with +1 max A while holding B UE onto foot board of hospital bed--pt able to get buttock clearance however unable to achieve erect posture -pt demos heavy trunk lean and dependence of forearm on B HR with crouched stance.   Pt demos improved carry over of use of tilt function in power WC for posterior scoot.   Pt positioned power WC next to bed with mod I.   Pt seated in power WC with seatbelt on and needs within reach with wife in room.   Treatment Session 2   Pt seated in  power WC upon arrival. Pt agreeable to therapy. Pt denies any pain but reports increased fatigue this afternoon.   Pt performed slide board transfer power WC to L to bed with significantly increased time with pt wife providing min A with use of chuck pad. Verbal cues provided to pt for positioning of WC. Pt performed slide board transfer bed to power WC to R with increased time (not as much as to L) with min A from wife. Pt wife demos improved understanding overall of placement of slide board.   Pt seated in power WC at end of session with all needs within reach and wife in room.      Therapy Documentation Precautions:  Precautions Precautions: Fall Precaution/Restrictions Comments: Foley, bilateral knee pain and decreased ROM Restrictions Weight Bearing Restrictions Per Provider Order: No  Therapy/Group: Individual Therapy  [facility] [place], [place], DPT  08/13/2023, 10:29 AM

## 2023-08-14 ENCOUNTER — Other Ambulatory Visit (HOSPITAL_COMMUNITY): Payer: Self-pay

## 2023-08-14 ENCOUNTER — Other Ambulatory Visit (HOSPITAL_BASED_OUTPATIENT_CLINIC_OR_DEPARTMENT_OTHER): Payer: Self-pay

## 2023-08-14 DIAGNOSIS — I4811 Longstanding persistent atrial fibrillation: Secondary | ICD-10-CM

## 2023-08-14 DIAGNOSIS — R310 Gross hematuria: Secondary | ICD-10-CM

## 2023-08-14 DIAGNOSIS — I483 Typical atrial flutter: Secondary | ICD-10-CM

## 2023-08-14 DIAGNOSIS — D62 Acute posthemorrhagic anemia: Secondary | ICD-10-CM

## 2023-08-14 LAB — CBC
HCT: 26.1 % — ABNORMAL LOW (ref 39.0–52.0)
Hemoglobin: 8.3 g/dL — ABNORMAL LOW (ref 13.0–17.0)
MCH: 26.3 pg (ref 26.0–34.0)
MCHC: 31.8 g/dL (ref 30.0–36.0)
MCV: 82.6 fL (ref 80.0–100.0)
Platelets: 301 10*3/uL (ref 150–400)
RBC: 3.16 MIL/uL — ABNORMAL LOW (ref 4.22–5.81)
RDW: 16.6 % — ABNORMAL HIGH (ref 11.5–15.5)
WBC: 26.8 10*3/uL — ABNORMAL HIGH (ref 4.0–10.5)
nRBC: 0 % (ref 0.0–0.2)

## 2023-08-14 LAB — BASIC METABOLIC PANEL WITH GFR
Anion gap: 11 (ref 5–15)
BUN: 73 mg/dL — ABNORMAL HIGH (ref 8–23)
CO2: 13 mmol/L — ABNORMAL LOW (ref 22–32)
Calcium: 8.1 mg/dL — ABNORMAL LOW (ref 8.9–10.3)
Chloride: 107 mmol/L (ref 98–111)
Creatinine, Ser: 5.49 mg/dL — ABNORMAL HIGH (ref 0.61–1.24)
GFR, Estimated: 10 mL/min — ABNORMAL LOW (ref >60)
Glucose, Bld: 86 mg/dL (ref 70–99)
Potassium: 3.6 mmol/L (ref 3.5–5.1)
Sodium: 131 mmol/L — ABNORMAL LOW (ref 135–145)

## 2023-08-14 LAB — GLUCOSE, CAPILLARY
Glucose-Capillary: 104 mg/dL — ABNORMAL HIGH (ref 70–99)
Glucose-Capillary: 121 mg/dL — ABNORMAL HIGH (ref 70–99)
Glucose-Capillary: 146 mg/dL — ABNORMAL HIGH (ref 70–99)

## 2023-08-14 LAB — MAGNESIUM: Magnesium: 2 mg/dL (ref 1.7–2.4)

## 2023-08-14 MED ORDER — DARBEPOETIN ALFA 100 MCG/0.5ML IJ SOSY
100.0000 ug | PREFILLED_SYRINGE | INTRAMUSCULAR | Status: DC
  Administered 2023-08-14: 100 ug via SUBCUTANEOUS
  Filled 2023-08-14: qty 0.5

## 2023-08-14 NOTE — Progress Notes (Signed)
 Area has healed.

## 2023-08-14 NOTE — Progress Notes (Signed)
   9 Days Post-Op Subjective: NAEON. Reviewed plan with Ulmer and jis family again today. Clear yellow urine  Objective: Vital signs in last 24 hours: Temp:  [97.6 F (36.4 C)-98.2 F (36.8 C)] 98.2 F (36.8 C) (04/11 0636) Pulse Rate:  [66-72] 66 (04/11 0636) Resp:  [18-19] 18 (04/11 0636) BP: (119-129)/(60-102) 119/60 (04/11 0636) SpO2:  [99 %] 99 % (04/11 0636) Weight:  [98.8 kg] 98.8 kg (04/11 0636)  Assessment/Plan: #left obstructing ureteral stone  S/p first step of staged treatment with Dr. Mena Goes 07/24/23  #right obstructing ureteral stone The day before discharging home (4/2) an existing right side stone obstructed and pt underwent cysto/stent placement w/ Dr. Pete Glatter.   #Hematuria-resolved  Urology has assessed and as patient is presently rhythm controlled, anticoagulation will stay off until he is stable postoperatively  Second stage procedure has been scheduled with [facility], as it appears the patient will still be here next week.  Intake/Output from previous day: 04/10 0701 - 04/11 0700 In: 461.1 [P.O.:360; IV Piggyback:101.1] Out: 475 [Urine:475]  Intake/Output this shift: No intake/output data recorded.  Physical Exam:  General: Alert and oriented CV: No cyanosis Lungs: equal chest rise Gu: foley catheter in place draining clear yellow urine  Lab Results: Recent Labs    08/12/23 0727 08/13/23 0539 08/14/23 0611  HGB 9.9* 8.5* 8.3*  HCT 30.8* 26.9* 26.1*   BMET Recent Labs    08/13/23 0539 08/14/23 0611  NA 132* 131*  K 3.6 3.6  CL 103 107  CO2 15* 13*  GLUCOSE 101* 86  BUN 75* 73*  CREATININE 5.55* 5.49*  CALCIUM 7.7* 8.1*     Studies/Results: No results found.     LOS: 35 days   Elmon Kirschner, NP [facility] Pager: [phone]  08/14/2023, 10:48 AM

## 2023-08-14 NOTE — Progress Notes (Signed)
 PROGRESS NOTE  Calvin Schultz GMW:[id number] DOB: 1950-10-30 DOA: 07/10/2023 PCP: Gwenlyn Found, MD   LOS: 35 days   Brief narrative:  Calvin Schultz is a 73 y.o. male with past medical history of atrial fibrillation on Eliquis, hypertension, diabetes mellitus type 2, CKD who was recently admitted to the hospital for acute renal failure and left-sided hydronephrosis secondary to staghorn calculus with left UPC stricture underwent nephrostomy placement.  Rehospitalization from 2/21-3/7 at [facility] with concern for septic shock initially on norepinephrine drip related to colitis of the descending colon. C. difficile antigen and toxin positive.  Patient was noted to have AKI superimposed on CKD stage IV for which nephrology was consulted and patient was placed on CRRT.  Patient was subsequently transferred to inpatient rehab.    Patient also had flank pain cough with productive sputum.    A CT scan of the abdomen pelvis was obtained which noted bilateral acute obstructive uropathy with evidence of bilateral collecting system hematuria with large 7- 8 mm diameter obstructing calculus of the right ureter at the pelvic inlet and new left hydronephrosis despite normally positioned double-J ureteral stent, bladder wall inflammation, anasarca, trace pelvic ascites, nodular liver contour giving concern for cirrhosis.  Patient was consulted for medical management of multiple medical issues including diarrhea..   Assessment/Plan: Principal Problem:   Right ureteral calculus Active Problems:   Acute kidney injury superimposed on chronic kidney disease (HCC)   Normocytic anemia   Chronic atrial fibrillation (HCC)   Debility   Metabolic acidosis   Pressure injury of skin   Leukocytosis   History of Clostridium difficile colitis   Controlled diabetes mellitus type 2 with complications (HCC)   Acute cystitis with hematuria   C. difficile diarrhea   Depression with anxiety   Hypokalemia    Hypomagnesemia   Obstructive uropathy Left AKI on CKD stage IV Metabolic acidosis  Latest creatinine at 5.4.  Patient had previously been on CRRT which was stopped on 3/19.  Urology on board and patient has undergone  cystoscopy with right retrograde pyelogram and insertion of right ureteral stent on 08/05/2023.  Patient did have staged treatment with Dr. Mena Goes for left obstructive ureteral stone on 07/24/2023.  Nephrology following.   Patient underwent CT scan of the abdomen on 08/11/23 with right obstructing nephrolithiasis plan for second stage procedure intervention by urology   Leukocytosis  C. difficile diarrhea.   History of C diff colitis Improving.  Stools have been forming better.  Currently on Dificid.  Significant leukocytosis trending down to 26.8 from 27.8 < 40.4 < 52.1<53.2.      Hypokalemia Improved.  Latest potassium of 3.6.  Magnesium of 2.0.   hypomagnesemia.  Improved after replacement.  Latest magnesium of 2.0  Hematuria. On Foley catheter.  Urology on board.  Has moderate brown stool urine today.  Status post ureteric stent placement .  Off anticoagulation.  Urology has plans for further intervention in the future.   Normocytic anemia Hemoglobin latest at 8.3 from 8.5 <9.9 and 9.4.   Received 1 unit of packed RBC during hospitalization.  Continue to monitor.  Monitor CBC periodically.   Atrial fibrillation/flutter Had hematuria so Eliquis on hold.  Rate controlled at this time.  Urine is starting to clear.   Class I obesity. Body mass index is 32.17 kg/m.  Would benefit from lifestyle modification and weight loss.  Controlled diabetes mellitus type 2  Hemoglobin A1c of 6.2 on 06/2023.  Continue sliding scale insulin Accu-Cheks.  Latest  POC glucose of 104  Orthostatic hypotension -Continue midodrine prn.  Latest blood pressure of 117/57 still has some orthostatic dizziness.  Encouraged oral hydration and orthostatic precautions.   Debility,  deconditioning. Undergoing rehabilitation.   Pressure injury right buttocks stage II, left buttocks stage II.    Present on admission.  Continue wound care.  DVT prophylaxis: Place TED hose Start: 07/17/23 5366   Disposition: currently at CIR ,will sign off from medical service at this time.  Spoke with the primary attending.   Code Status:     Code Status: Full Code  Family Communication: Spoke with the patient's wife at bedside on 08/14/2023  Consultants: Urology Infectious disease,   neuropsychology  Procedures: PRBC transfusion Cystoscopy and double-J ureteric stent placement on the right on 08/05/2023  Anti-infectives:  Cefepime IV Dificid 08/09/23  Anti-infectives (From admission, onward)    Start     Dose/Rate Route Frequency Ordered Stop   08/09/23 1330  fidaxomicin (DIFICID) tablet 200 mg        200 mg Oral 2 times daily 08/09/23 1240     08/07/23 1600  ceFEPIme (MAXIPIME) 2 g in sodium chloride 0.9 % 100 mL IVPB        2 g 200 mL/hr over 30 Minutes Intravenous Every 24 hours 08/07/23 1432 08/17/23 1559   08/07/23 1200  vancomycin (VANCOCIN) capsule 125 mg  Status:  Discontinued        125 mg Oral 4 times daily 08/07/23 0946 08/09/23 1239   08/07/23 0930  vancomycin (VANCOCIN) 50 mg/mL oral solution SOLN 125 mg  Status:  Discontinued        125 mg Per Tube 4 times daily 08/07/23 0908 08/07/23 0946   08/05/23 1315  cefTRIAXone (ROCEPHIN) 2 g in sodium chloride 0.9 % 100 mL IVPB        2 g 200 mL/hr over 30 Minutes Intravenous  Once 08/05/23 1308 08/05/23 1620   08/05/23 0800  cephALEXin (KEFLEX) capsule 250 mg  Status:  Discontinued        250 mg Oral Daily 08/04/23 1916 08/07/23 1432   08/05/23 0000  cephALEXin (KEFLEX) 250 MG capsule        250 mg Oral Daily 08/05/23 0657     07/24/23 0600  cefTRIAXone (ROCEPHIN) 2 g in sodium chloride 0.9 % 100 mL IVPB        2 g 200 mL/hr over 30 Minutes Intravenous 30 min pre-op 07/23/23 1600 07/24/23 0701   07/20/23 0800   cephALEXin (KEFLEX) capsule 250 mg        250 mg Oral 2 times daily 07/17/23 0938 07/25/23 0759      Subjective: Today, patient was seen and examined at bedside.  Patient states that he feels better every day.  Still volume status is decreased.  No nausea vomiting.  Urine without frank blood.  Patient's wife at bedside.  Trying to work with physical therapy.  Objective: Vitals:   08/13/23 1834 08/14/23 0636  BP: (!) 129/102 119/60  Pulse: 72 66  Resp: 19 18  Temp: 97.6 F (36.4 C) 98.2 F (36.8 C)  SpO2: 99% 99%    Intake/Output Summary (Last 24 hours) at 08/14/2023 1120 Last data filed at 08/14/2023 1117 Gross per 24 hour  Intake 341.11 ml  Output 875 ml  Net -533.89 ml   Filed Weights   08/12/23 2111 08/13/23 0500 08/14/23 0636  Weight: 99.2 kg 99.2 kg 98.8 kg   Body mass index is 32.17 kg/m.  Physical Exam:  GENERAL: Patient is alert awake and mildly Communicative, not in obvious distress.  Obese.  Sitting up, on the chair. HENT: No scleral pallor or icterus. Pupils equally reactive to light. Oral mucosa is moist NECK: is supple, no gross swelling noted. CHEST:  Diminished breath sounds bilaterally. CVS: S1 and S2 heard, no murmur.  Irregular rhythm. ABDOMEN: Soft, nontender, no rigidity guarding or tenderness.  Bowel sounds are present.  Foley catheter in place with muddy brown urine EXTREMITIES: Bilateral pitting edema noted. CNS: Cranial nerves are intact. No focal motor deficits. SKIN: warm and dry, pressure injury bilateral buttocks, present on admission.  Data Review: I have personally reviewed the following laboratory data and studies,  CBC: Recent Labs  Lab 08/10/23 0521 08/11/23 0505 08/12/23 0727 08/13/23 0539 08/14/23 0611  WBC 53.2* 52.1* 40.4* 27.8* 26.8*  HGB 8.7* 9.4* 9.9* 8.5* 8.3*  HCT 26.9* 29.6* 30.8* 26.9* 26.1*  MCV 83.0 83.4 82.4 83.3 82.6  PLT 310 289 326 300 301   Basic Metabolic Panel: Recent Labs  Lab 08/11/23 0505  08/12/23 0727 08/12/23 1628 08/13/23 0539 08/14/23 0611  NA 131* 130* 133* 132* 131*  K 3.1* 3.3* 3.6 3.6 3.6  CL 104 101 103 103 107  CO2 12* 13* 17* 15* 13*  GLUCOSE 92 98 116* 101* 86  BUN 79* 77* 75* 75* 73*  CREATININE 5.32* 5.32* 5.61* 5.55* 5.49*  CALCIUM 7.9* 7.7* 8.0* 7.7* 8.1*  MG 1.6* 2.1  --   --  2.0  PHOS  --  7.3*  --   --   --    Liver Function Tests: No results for input(s): "AST", "ALT", "ALKPHOS", "BILITOT", "PROT", "ALBUMIN" in the last 168 hours.  No results for input(s): "LIPASE", "AMYLASE" in the last 168 hours. No results for input(s): "AMMONIA" in the last 168 hours. Cardiac Enzymes: No results for input(s): "CKTOTAL", "CKMB", "CKMBINDEX", "TROPONINI" in the last 168 hours. BNP (last 3 results) No results for input(s): "BNP" in the last 8760 hours.  ProBNP (last 3 results) No results for input(s): "PROBNP" in the last 8760 hours.  CBG: Recent Labs  Lab 08/12/23 0726 08/12/23 1142 08/12/23 1618 08/13/23 0600 08/14/23 0632  GLUCAP 93 98 117* 99 104*   Recent Results (from the past 240 hours)  Urine Culture (for pregnant, neutropenic or urologic patients or patients with an indwelling urinary catheter)     Status: Abnormal   Collection Time: 08/04/23  2:08 PM   Specimen: Urine, Catheterized  Result Value Ref Range Status   Specimen Description URINE, CATHETERIZED  Final   Special Requests   Final    NONE Performed at [facility], [address] [place], [place] [address]    Culture >=100,000 COLONIES/mL YEAST (A)  Final   Report Status 08/05/2023 FINAL  Final     Studies: No results found.      Joycelyn Das, MD  [facility] 08/14/2023  If 7PM-7AM, please contact night-coverage

## 2023-08-14 NOTE — Progress Notes (Addendum)
 PROGRESS NOTE   Subjective/Complaints: Bowel movements continue to get less frequent.  Urine appears less dark today.  Urology planning procedure next week.  Nephrology still following, may consider RRT.  ROS: Patient denies fever, rash, sore throat, blurred vision, dizziness,  vomiting,   cough, shortness of breath or chest pain, headache, or mood change.  + Diarrhea - improved + poor appetite - improved     Objective:   No results found.      Recent Labs    08/13/23 0539 08/14/23 0611  WBC 27.8* 26.8*  HGB 8.5* 8.3*  HCT 26.9* 26.1*  PLT 300 301     Recent Labs    08/13/23 0539 08/14/23 0611  NA 132* 131*  K 3.6 3.6  CL 103 107  CO2 15* 13*  GLUCOSE 101* 86  BUN 75* 73*  CREATININE 5.55* 5.49*  CALCIUM 7.7* 8.1*      Intake/Output Summary (Last 24 hours) at 08/14/2023 1311 Last data filed at 08/14/2023 1117 Gross per 24 hour  Intake 581.11 ml  Output 875 ml  Net -293.89 ml         Physical Exam: Vital Signs Blood pressure 118/61, pulse 67, temperature 97.6 F (36.4 C), resp. rate 16, height 5\' 9"  (1.753 m), weight 98.8 kg, SpO2 100%.  Constitutional: No distress . Vital signs reviewed.  Sitting at edge of bed HEENT: NCAT, EOMI, oral membranes moist Neck: supple Cardiovascular: RRR without murmur. No JVD    Respiratory/Chest: CTA Bilaterally without wheezes or rales. Normal effort    GI/Abdomen: BS +,  tender, non-distended, tender to palp Ext: no clubbing, cyanosis, 2-3+ LE edema and UE edema Psych: pleasant and cooperative  GU: Foley draining urine-still little dark but improved from yesterday    Skin: No evidence of breakdown, no evidence of rash Neurologic: Cranial nerves II through XII grossly intact, motor strength is moving all 4 extremities to gravity and resistance    MSK: Bilateral knee effusions ongoing. Less pain with ROM. Knee sleeves in place Left shoulder tight in  ER/IR/F/E            Assessment/Plan: 1. Functional deficits which require 3+ hours per day of interdisciplinary therapy in a comprehensive inpatient rehab setting. Physiatrist is providing close team supervision and 24 hour management of active medical problems listed below. Physiatrist and rehab team continue to assess barriers to discharge/monitor patient progress toward functional and medical goals  Care Tool:  Bathing  Bathing activity did not occur:  (patient completed a simulated task at bed LOF) Body parts bathed by patient: Right arm, Left arm, Chest, Abdomen, Front perineal area, Right upper leg, Left upper leg, Face   Body parts bathed by helper: Buttocks, Left lower leg, Right lower leg     Bathing assist Assist Level: Maximal Assistance - Patient 24 - 49%     Upper Body Dressing/Undressing Upper body dressing   What is the patient wearing?: Pull over shirt    Upper body assist Assist Level: Contact Guard/Touching assist    Lower Body Dressing/Undressing Lower body dressing      What is the patient wearing?: Pants, Underwear/pull up, Incontinence brief     Lower  body assist Assist for lower body dressing: Maximal Assistance - Patient 25 - 49%     Toileting Toileting    Toileting assist Assist for toileting: 2 Helpers     Transfers Chair/bed transfer  Transfers assist  Chair/bed transfer activity did not occur: Safety/medical concerns (not safe to get up)  Chair/bed transfer assist level: Maximal Assistance - Patient 25 - 49%     Locomotion Ambulation   Ambulation assist   Ambulation activity did not occur: Safety/medical concerns  Assist level: Dependent - Patient 0% Assistive device: Other (comment) (sara plus) Max distance: 18 steps   Walk 10 feet activity   Assist  Walk 10 feet activity did not occur: Safety/medical concerns        Walk 50 feet activity   Assist Walk 50 feet with 2 turns activity did not occur: Safety/medical  concerns         Walk 150 feet activity   Assist Walk 150 feet activity did not occur: Safety/medical concerns         Walk 10 feet on uneven surface  activity   Assist Walk 10 feet on uneven surfaces activity did not occur: Safety/medical concerns         Wheelchair     Assist Is the patient using a wheelchair?: Yes Type of Wheelchair: Power    Wheelchair assist level: Supervision/Verbal cueing Max wheelchair distance: 300+    Wheelchair 50 feet with 2 turns activity    Assist        Assist Level: Supervision/Verbal cueing   Wheelchair 150 feet activity     Assist      Assist Level: Supervision/Verbal cueing   Blood pressure 118/61, pulse 67, temperature 97.6 F (36.4 C), resp. rate 16, height 5\' 9"  (1.753 m), weight 98.8 kg, SpO2 100%.   Medical Problem List and Plan: 1. Functional deficits secondary to debility related to septic shock/C. difficile colitis/AKI superimposed on CKD stage IV             -patient may shower with tubes/drains covered             -ELOS/Goals:  3/28, min assist PT/OT             -Continue CIR therapies including PT, OT   -Team meeting scheduled for Tuesday  -family/ team meeting  Tuesday at 1 PM.  -Daily therapy for now  -Family meeting completed yesterday  -Will call insurance peer to peer-called and scheduled 2 time periods for call back  -Exp DC 4/18 currently    2.  Antithrombotics: -DVT/anticoagulation:  Pharmaceutical: Eliquis             -antiplatelet therapy: N/A   3. Pain Management: tramadol prn, Zanaflex 4 mg twice daily as needed   Biliateral OA of knees  -ordered bilateral neoprine knee sleeves --try today  -voltaren gel tid to knees  -have scheduled tramadol 50mg  at 0700 and 1200 daily. Continue prn as well  Mild adhesive capsulitis left shoulder---  sports cream. Can use voltaren gel also. Aggressive ROM with therapy and while in bed--continue  -Will check right knee x-ray, left knee  x-ray on 3/8 with advanced OA.  Will consult orthopedics for cortisone injections  -3-28: Orthopedics aspirated and injected left knee, with improvement.  Ongoing pain with right knee.  3/31 Knee fluid calcium pyrophosphate cyrystals, pseudogout?, cultures with no growth  -4/2 hydrocodone PRN started yesterday for flank pain  4/3 reconsult orthopedics, will come up and talk to him today  about his right knee.  I had spoken with orthopedics on 4/1 are considering oral steroids due to crystals, pseudogout?  But held off due to more acute medical issues  4/4 right knee is feeling better continue to monitor  4/5 continues to be improved although has not had therapy today  4/6-11 reports knee pain is better    4. Mood/Behavior/Sleep: Trazodone 50 mg nightly as needed.  Provide emotional support             -antipsychotic agents: N/A   5. Neuropsych/cognition: This patient is capable of making decisions on his own behalf.   6. Skin/Wound Care: Routine skin checks   7. Fluids/Electrolytes/Nutrition:    -3/14 po intake remains poor, albumin low, weights falling  -will ask RD for assistance. Have spoken to patient about intake  -?IV albumin (see below)  3/18 discussed with dietitian, patient recorded is eating 0% of his meals.  Appears that wife bringing in 3 meals a day and he is eating about 75%, appears to have fairly adequate intake.  3/27 eating frequent outside foods-doesn't like hospital food as much  4/7 encouraged pt to eat any food he feels like. More important to just get calories in at this point  4/11 appetite is improving  8.  Oliguric AKI superimposed on CKD stage IV.  Baseline creatinine 4.9.  CRRT discontinued 2/25.  No current plan for long-term dialysis.  Follow-up renal services              - Per discussion with nephrology Dr. Verna Czech, keep Foley catheter in to allow accurate I's and O's through Monday; then, can remove and do DC Foley trial while continuing to document strict  outputs             - Continue daily assessments of renal function per nephrology   3/8- per renal, Cr leveling out- and con't Foley- will remove Monday  -nephrology following, Cr remains in 4's.   3/14 renal function with some improvement today     -suspect lower extremity edema is related to renal function/low albumin   Appreciate nephro input   -3/17 BUN/creatinine slightly improved 34/3.31, appreciate nephrology assistance  3/19 BUN/CR stable at 34/3.24, nephrology signing off, ok to check BMET twice a week  3/23 encouraging po this weekend. happy that he's eating food from home  3/24 BUN/CR overall stable at 40/3.26  -3/31 BUN/Cr a little higher today, IVF 66ml/hr  -4/1 Cr a little higher again, will contact nephrology  -4/3 nephrology note reviewed, IV Lasix discontinued and he was given some Lasix today, changed oral bicarb by nephrology, can give IV as needed  4/4 patient has a lot of edema, nephrology reducing Lasix today and holding IVF creatinine higher at 5.14  4/5 reviewed nephrology note, patient getting albumin and Lasix today  4/7 labs reviewed, similar to yesterday, ongoing mgt per nephro  4/8 Cr stable for past few days, CT ordered by nephrology to r/o other obstructive issue  4/9 discussed CT scan results with urology by phone, no interventions recommended at this time. Cr stable. Will await formal note for recommendations.   4/10 nephrology has ordered removal/replace foley, may need RRT-appears urology recommended to keep current Foley in place 9.  C. difficile colitis.  Completed course of oral vancomycin 3/7.  DC'd IV Flagyl 3/3. Enteric precautions   3/14 stools formed this morning   3/17 discussed with ID pharmacy, patient having more frequent bowel movements.  Monitor today and if continues consider  Dificid treatment  3/18 diarrhea appears to be improving, continue to monitor for now  3/19 Frequent Bms improved, continue to follow  3/21 having more frequent bowel  movements again today, continue to monitor and if frequent diarrhea persists this weekend consider treatment with Dificid  3/23- mushy/lqiuid stool again overnight    -no dificid at this point. Discuss with GI first on Monday    -added fiber to regimen yesterday, will increase to bid today  3/24 mushy/ liquid BMs, about 2 a day yesterday, more other days- will discuss GI  3/25 GI recommended repeat C. difficile screening. Discussed with Jerolyn [facility], hold off on repeat screening test, he only had about 2 bowel movements a day but if continues to have frequent BMs consider retreatment with oral vancomycin for 10 days  3/26 pt reports Bms more firm, continue to monitor   3/28 patient reports BMs have been more solid, continue to monitor.  If worsening diarrhea occurs consider retreatment with vancomycin oral for 10 days  3-29: Diarrhea this a.m., no bowel movements this afternoon recorded  3-30: No BM since yesterday.  3-31 diarrhea continues to be improved overall  4-3 LBM yesterday, reports diarrhea continues to be improved  -4/4 patient with increased diarrhea, discussed with hospitalist and ID.  Restarted oral vancomycin 125 mg 4 times a day for 10 days  -4/5 continue vancomycin oral, hospitalist following appreciate assistance  -4/6 discussed with ID today, will change to Dificid  -4/7 WBC's sl improved to 53k today. Continue fidaxomicin, fiber,etc   -appreciate ID f/u  4/8 WBC a little better today 52.1, continue to monitor. Still having frequent Bms  4/9 WBC improved to 40.4, continue to monitor. Diarrhea a little better  4/10 WBC decreased 27.8 Much improved, continue current regimen  4/11 WBC a little better again today to 26.8 10.  Status post left nephrostomy tube d/t staghorn calculi. Has Foley Nephrostomy tube placed 1/29 per IR with plan for ureteric stent placement 3/21              - Careful monitoring of nephrostomy output  -3/14 flomax has been added to improve  emptying  -3/21 patient scheduled today for cystoscopy with left retrograde pyelogram, left ureteroscopy laser lithotripsy and left ureteral stent placement.     3/22- procedure appears successful thus far.    -urine clear, patient comfortable   -foley to come out on Monday per urology  3/25 Foley was removed patient continues to have urinary retention requiring IC.  Increase Flomax to 0.8 mg  3/26 intermittent blood noted with IC, if continues will restart foley  3/27 Urine no longer blood tinged, continue current regimen, consider restart foley if reoccurs  3/27 continues to require catheterization but no longer blood-tinged Addendum 3/28 foley started after traumatic cath, possible false passage, continue foley for now, f/u with urology outpatient 4/1 foley with hematuria, denies known trauma to this, will ask nursing to check bladder scan. U/A culture ordered.  Called his urologist to discuss 4/2 CT abdomen/pelvis-bilateral obstructive uropathy with collecting system hematuria, large calculus in right ureter and pelvic inlet, new left hydronephrosis, new anasarca, liver nodules raising possibility of cirrhosis.  Discussed patient with urology - surgical procedure today  Addendum discussed with urology, will hold eliquis for now due to bleeding, check EKG- A flutter -4/3 cystoscopy, right retrograde pyelogram, right ureteral stent insertion yesterday by Dr. Pete Glatter, further procedure scheduled on 4/15 with Dr. Mena Goes.  Discussed with [facility] urology.  Do not think  traumatic cath last week caused recurrent bleeding his kidney, his wife was concerned about this.  Patient was having nonbloody urine for several days on the Foley.  Will continue Foley and not attempt intermittent cath.  Patient suspected to have false passage. 4/4 urine appears less dark today, urology considering TXA tomorrow if bleeding is not resolved, Eliquis on hold 4/5 appreciate urology assistance, bleeding does  appear to be improving, only blood-tinged urine in Foley tubing.  Consider restart Eliquis when 4/6 Discussed with urology,  bleeding improving, hold off on TXA, continue to hold eliquis for today due to high risk of bleeding 4/7- urine appears to be clearing although hgb down to 8.7 today  -continue serial labs  -don' think TXA is indicated right now  -would keep eliquis on hold 4/8 Continue to hold eliques, could consider heparin trail- discuss with hospitalist- CT scan results before considering 4/9 Will check EKG to see if still having afib/flutter, consider heparin infusion, will hold off today as urine looks a little pink/blood tinged 4/10 Dark urine today, Pt was seen by urology, continue to monitor 4/11 Urine less dark today, continue to monitor  11.  Chronic atrial fibrillation with episodes of bradycardia.  Followed by cardiology services.  Continue Eliquis.  Avoid AV nodal blocking agents -4/11 cardiology consulted appreciate assistance, rate control.  Continue to hold anticoagulation for now.  Cardiology may consider left atrial appendage occlusion device after discharge.   12.  Diabetes mellitus.  Hemoglobin A1c 6.2.    -tightly controlled. Dc SSI and change cbg checks to qam only   CBG (last 3)  Recent Labs    08/13/23 0600 08/14/23 0632 08/14/23 1200  GLUCAP 99 104* 121*       3-30: Recently increased.  Somewhat expected with intra-articular steroid injection.  Continue to trend.  4/5-7 controlled, continue to monitor   controlled, continue to monitor   13.  Hypotension.  ProAmatine 10 mg 3 times daily.  Monitor with increased mobility   - Normotensive on admission  3/9- BP running 100-s to 120s systolic- con't regimen  3/19 BP has been on high side, will change midodrine to 5mg  TID PRN orthostatic hypotension  3/28 BP stable, continue current regimen  -4/3-10 Mild dizziness with therapy  4/11 BP stable continue current regimen     08/14/2023   12:56 PM 08/14/2023     6:36 AM 08/13/2023    6:34 PM  Vitals with BMI  Weight  217 lbs 13 oz   BMI  32.15   Systolic 118 119 960  Diastolic 61 60 102  Pulse 67 66 72    14.  Chronic anemia.  Niferex daily/Aranesp.  Monitor for any bleeding episodes   3/8- Hb 7.1 today- will order transfusion- transfusion of 1 unit pRBCs.   3/14 hgb up to 9.1  -3/17 hemoglobin down to 8.4, continue to monitor  -3/19 HGB 8.3 today, stable overall, continue to monitor    -3/27 HGB 7.7- a little lower likely due to GU procedure/hematuria, continue to monitor  3/31 HGB 7.6 today, transfuse 7 or less  4/7 hgb down to 8.7. hematuria better--continue to monitor serially   16. Leukocytosis  3/9- Pt's WBC is 21.7- up from 16k- is afebrile- due to his recent C diff, that just finished correct dosing of ABX for, called ID for guidance on treatment- don't want to cause more Cdiff- ID consulted ---no new orders  3/10 WBC's down to 17k  --have been hovering above and below  20 for awhile. No new clinical symptoms. Nephrostomy drainage clear, yellow  3/14 wbc's down to 13k.--recheck Monday   3/17 WBC is down to 12.4  3/19 WBC 13.4 today, continue to monitor  3/24 WBC higher today 18.7, Discuss C diff treatment with GI a above 3/31 WBC still elevated 19.0, monitor for signs of infection 4/3 WBC still elevated to 23.8, potentially due to his urological issues and procedure.  Hospitalist also following appreciate assistance  4/4 WBC higher today, oral vancomycin started as above.  Discussed with ID Patrecia Pace and hospitalist.  Patient had Pseudomonas resulting in prior urine culture.  Recommended cefepime for approximately 10 days depending on WBC.  Called pharmacy for cefepime dosing.  ID recommended replacing Foley due to yeast on last culture, discussed with urology who indicates this was already done so hold off on changing again.  4/5 continue current antibiotic regimen, hospitalist team following.  WBC higher today-discussed with  hospitalist yesterday, often see higher WBCs with C. difficile.  Continue monitor trend daily.  Patient remains afebrile and vital stable  4/7 WBC's 53k, sl improvement   -continue fidaxomicin   -appreciate ID help   -serial labs  4/10 WBC improved to 27  17. Decreased mood reported by therapy  -Consider SSRI  -3/20 patient reports his mood is okay, denies depression this morning.  Continue to monitor  18. Insomnia   -3/20 schedule trazodone and increase to 75mg  at bedtime  -3/21 insomnia improved but bowel movements at night did wake him up a few times  3/23 sleeping better when stooling isn't an issue  19. LE edema  -Significantly improved on the left after steroid injection and effusion tap.  -IVF discontinued as above  - Nephrology following, treating with Lasix, albumin  -4/7 edema with some improvement. albumin /lasix per nephro   -elevate/compression   -improve nutrition  20. Low MG and potassium  -4/8 mg and K+ repletion today  -4/9 addition K+ was ordered  4/11 K+ stable 3.6, MG 2.0    Mobility Assessment  Deshan Hemmelgarn was seen for the purpose of a mobility assessment for a powered wheelchair. I have reviewed and agree with the detailed PT evaluation. Jillyn Hidden Porras suffers from chronic weakness, joint pain  related to DM, CKD, Knee OA, A fib. Due to joint pain, decreased endurance and obesity he is unable to utilize a cane, walker, manual wheelchair, or scooter. The patient is appropriate for a customized power wheelchair.  With a power wheelchair  Lamario Mani can move independently at a household level and on a limited basis in the community. The chair will also allow the patient to perform ADL's more easily. The patient is competent to operate the recommended chair on his own and is motivated to utilize the chair on a daily basis.    LOS: 35 days A FACE TO FACE EVALUATION WAS PERFORMED  Fanny Dance 08/14/2023, 1:11 PM

## 2023-08-14 NOTE — Progress Notes (Signed)
  Physical Therapy Session Note  Patient Details  Name: Calvin Schultz MRN: [id number] Date of Birth: 1950/06/29  Today's Date: 08/14/2023 PT Individual Time: 1116-1201 PT Individual Time Calculation (min): 45 min   Short Term Goals: Week 5:  PT Short Term Goal 1 (Week 5): STG=LTG 2/2 ELOS  Skilled Therapeutic Interventions/Progress Updates:    Pt presents in room in bed receiving personal care from nursing staff, therapist reenters room after 15 minutes and pt supine in bed, agreeable to PT. Pt does not report pain at start of session however does report knee pain during sit<>stands.  Pt completes bed mobility with increased time, wife providing min assist therapist providing close supervision and cues for room set up prior to mobility. Pt reporting dizziness with mobility that improves with increased time in upright. Pt wife then sets up PWC and slideboard for transfer to [facility] to R, therapist provide min cue for slideboard set up. Pt completes slideboard transfer with min assist from wife, wife providing cues throughout, demonstrating good guarding technique and providing good cues for positioning once pt in chair for posterior scooting.  Pt then self propels PWC modI to main gym and set up on outside of //bars facing //bars for sit<>stand practice to pull to stand. Pt attempts x1 sit<>stand with max assist x2 however pt unable to achieve terminal hip extension and demonstrating flexed trunk with forearms placed on //bar. Pt then completes x3 subsequent stands with therapist blocking L knee, sheet around hips with 2nd person pulling pt to stand and pt wife on R side providing tactile cueing to anterior shoulder for terminal hip/trunk extension. Pt remains standing 60 sec, 30 sec, and 45 sec. On first trial pt provided with cues for glute sets, x10; 3rd trial provided with cues for x5 trunk extension and shoulder retraction. Pt provided with cues to keep elbows straight and hands flight on bar as pt  with tendency to let go of bar in standing. Pt reporting pain in R knee with prolonged standing with crepitus noted. Pt requires rest breaks between trials seated in PWC.  Pt returns to sitting in PWC,  returns to room and remains seated in PWC with all needs within reach, cal light in place and wife present at end of session.   Therapy Documentation Precautions:  Precautions Precautions: Fall Precaution/Restrictions Comments: Foley, bilateral knee pain and decreased ROM Restrictions Weight Bearing Restrictions Per Provider Order: No General: PT Amount of Missed Time (min): 15 Minutes PT Missed Treatment Reason: Nursing care   Therapy/Group: Individual Therapy  Edwin Cap PT, DPT 08/14/2023, 1:07 PM

## 2023-08-14 NOTE — Progress Notes (Signed)
 Occupational Therapy Session Note  Patient Details  Name: Calvin Schultz MRN: [id number] Date of Birth: January 08, 1951  Today's Date: 08/14/2023 OT Individual Time: 9811-9147 OT Individual Time Calculation (min): 72 min     Skilled Therapeutic Interventions/Progress Updates:    1:1 Pt received in the bed. Wife had already worked to thread pt's pants. Pt eating in the bed. Pt rolled with bed rail with min A to the left and right for wife and therapist to assist with pulling up pants. Socks and knee sleeves donned totalA. Pt able to come to EOB with min A with extra time and cues for successful method. Pt continues to need a minute at EOB due to initial dizziness with positional changes. Pt's wife donned shoes. Discussion with pt and pt's wife about goal for toileting and toilet transfers at home. Pt's wife reports she has been looking at [facility] online. Method would be to use the slide board for transfers. Discussed and demonstrated an option of using a padded tub bench for transfers to increased friction between the board the surface - v the surface of the BSC. Then pt would have to do lateral leans for clothing management. Instructed pt's wife on proper hand placement to assist with transfer from the front with hands at his hip using his pants and the gait belt. Pt and pt's wife performed slide board to the tub bench and then after rest break transferred to the power chair with mod A from therapist to help pt slide on board.  Discussed home setup and wife's assistance with ADLs and transfers at home. Pt's wife does confirm she doesn't want to use the manual hoyer lift at home. Pt able to use the features of the chair for position and to get to the sink with min cues. Pt able to perform toothbrushing and washing face with setup.  Therapy Documentation Precautions:  Precautions Precautions: Fall Precaution/Restrictions Comments: Foley, bilateral knee pain and decreased ROM Restrictions Weight Bearing  Restrictions Per Provider Order: No  Pain: Pain Assessment Pain Scale: 0-10 Pain Score: 9  Pain Location: Leg Pain Intervention(s): Medication (See eMAR)    Therapy/Group: Individual Therapy  Roney Mans [facility] 08/14/2023, 1:11 PM

## 2023-08-14 NOTE — Progress Notes (Signed)
Area healed

## 2023-08-14 NOTE — Progress Notes (Signed)
 [facility] Progress Note   Subjective:  Cr stable-  had a good day today.   Urology following-  plans for IP procedure next week.    Objective Vitals:   08/13/23 0626 08/13/23 1834 08/14/23 0636 08/14/23 1256  BP: 122/81 (!) 129/102 119/60 118/61  Pulse: 66 72 66 67  Resp: 18 19 18 16   Temp: (!) 97.3 F (36.3 C) 97.6 F (36.4 C) 98.2 F (36.8 C) 97.6 F (36.4 C)  TempSrc:      SpO2: 100% 99% 99% 100%  Weight:   98.8 kg   Height:       Physical Exam General: sitting in chair Heart: RRR Lungs: clear Abdomen: soft Extremities: 2+ L tibial edema 2+ R tibial, LUE 2+ RUE 2+ -->  improved GU:  foley draining milky colored brownish urine  Additional Objective Labs: Basic Metabolic Panel: Recent Labs  Lab 08/12/23 0727 08/12/23 1628 08/13/23 0539 08/14/23 0611  NA 130* 133* 132* 131*  K 3.3* 3.6 3.6 3.6  CL 101 103 103 107  CO2 13* 17* 15* 13*  GLUCOSE 98 116* 101* 86  BUN 77* 75* 75* 73*  CREATININE 5.32* 5.61* 5.55* 5.49*  CALCIUM 7.7* 8.0* 7.7* 8.1*  PHOS 7.3*  --   --   --    Liver Function Tests: No results for input(s): "AST", "ALT", "ALKPHOS", "BILITOT", "PROT", "ALBUMIN" in the last 168 hours.  No results for input(s): "LIPASE", "AMYLASE" in the last 168 hours. CBC: Recent Labs  Lab 08/10/23 0521 08/11/23 0505 08/12/23 0727 08/13/23 0539 08/14/23 0611  WBC 53.2* 52.1* 40.4* 27.8* 26.8*  HGB 8.7* 9.4* 9.9* 8.5* 8.3*  HCT 26.9* 29.6* 30.8* 26.9* 26.1*  MCV 83.0 83.4 82.4 83.3 82.6  PLT 310 289 326 300 301   Blood Culture    Component Value Date/Time   SDES URINE, CATHETERIZED 08/04/2023 1408   SPECREQUEST  08/04/2023 1408    NONE Performed at [facility], [address] [place], [place] [address]    CULT >=100,000 COLONIES/mL YEAST (A) 08/04/2023 1408   REPTSTATUS 08/05/2023 FINAL 08/04/2023 1408    Cardiac Enzymes: No results for input(s): "CKTOTAL", "CKMB", "CKMBINDEX", "TROPONINI" in the last 168  hours. CBG: Recent Labs  Lab 08/12/23 1142 08/12/23 1618 08/13/23 0600 08/14/23 0632 08/14/23 1200  GLUCAP 98 117* 99 104* 121*   Iron Studies: No results for input(s): "IRON", "TIBC", "TRANSFERRIN", "FERRITIN" in the last 72 hours. @lablastinr3 @ Studies/Results: No results found.   Medications:  ceFEPime (MAXIPIME) IV Stopped (08/13/23 1700)      Chlorhexidine Gluconate Cloth  6 each Topical BID   darbepoetin (ARANESP) injection - NON-DIALYSIS  40 mcg Subcutaneous Q Fri-1800   diclofenac Sodium  2 g Topical QID   fidaxomicin  200 mg Oral BID   iron polysaccharides  150 mg Oral Daily   multivitamin  1 tablet Oral QHS   Muscle Rub   Topical TID   psyllium  1 packet Oral BID   sodium bicarbonate  1,300 mg Oral TID   tamsulosin  0.8 mg Oral QPC supper   traMADol  50 mg Oral BID   traZODone  75 mg Oral QHS    Assessment/Plan:73yo M A fib, DM, A fib on eliquis, nephrolithiasis s/p recent PCN for staghorn calculus currently admitted to CIR for debility and nephrology is reconsulted for AKI on CKD.  **AKI on CKD 4:   Baseline kidney function 05/2023 Cr 4.1 - 5.3 f/b CKA.  After CRRT stopped his creatinine  improved to nadir of 3.2 on 3/19 and has gradually trended up 3/27 3.6 > 3/31 4.2 > 4/1 4.45 . Had staghorn calculus on L s/p PCN. Found to have obstructing stone on R s/p OR with ureteral stent 4/2.  Cr trending up further 5.14 > 5.4 4/6 - pending still today.  Remains nonoliguric with no uremic symptoms; no RRT needed at this time but discussed need may arise in coming days.  Give albumin 25g x 2 and lasix 60 x 2 again today and holding IVF.  Daily labs for now.  Strict I/Os.  Avoid hypotension and nephrotoxins.     -  CT scan today to rule out any other obstructive insults-  possibly  - may need to consider RRT in the coming days if nothing reversible identified.  - appreciate urology input-  for definitive procedure on 4/15   -  remove and replace Foley done on  4/10  **Anemia, normocytic:  Transfuse as needed for Hb < 7.  Having acute blood loss through foley but H/H stable.  Eliquis has been held.  On aranesp.   **Metabolic acidosis:  worsening in context of AKI; in light of worsening overload d/cd bicarb gtt and switched to oral.   **A fib:  eliquis on hold for acute bleeding  **Orthostatic hypotension:  has been on midodrine for the last few weeks, BPs look ok currently.   **Debility:  on CIR    Cecille Aver  08/14/2023, 1:09 PM  [employer]

## 2023-08-15 LAB — BASIC METABOLIC PANEL WITH GFR
Anion gap: 12 (ref 5–15)
BUN: 70 mg/dL — ABNORMAL HIGH (ref 8–23)
CO2: 14 mmol/L — ABNORMAL LOW (ref 22–32)
Calcium: 7.7 mg/dL — ABNORMAL LOW (ref 8.9–10.3)
Chloride: 105 mmol/L (ref 98–111)
Creatinine, Ser: 5.85 mg/dL — ABNORMAL HIGH (ref 0.61–1.24)
GFR, Estimated: 10 mL/min — ABNORMAL LOW (ref >60)
Glucose, Bld: 108 mg/dL — ABNORMAL HIGH (ref 70–99)
Potassium: 3.4 mmol/L — ABNORMAL LOW (ref 3.5–5.1)
Sodium: 131 mmol/L — ABNORMAL LOW (ref 135–145)

## 2023-08-15 LAB — CBC
HCT: 27.5 % — ABNORMAL LOW (ref 39.0–52.0)
Hemoglobin: 8.6 g/dL — ABNORMAL LOW (ref 13.0–17.0)
MCH: 26.3 pg (ref 26.0–34.0)
MCHC: 31.3 g/dL (ref 30.0–36.0)
MCV: 84.1 fL (ref 80.0–100.0)
Platelets: 289 10*3/uL (ref 150–400)
RBC: 3.27 MIL/uL — ABNORMAL LOW (ref 4.22–5.81)
RDW: 16.8 % — ABNORMAL HIGH (ref 11.5–15.5)
WBC: 24.8 10*3/uL — ABNORMAL HIGH (ref 4.0–10.5)
nRBC: 0 % (ref 0.0–0.2)

## 2023-08-15 LAB — GLUCOSE, CAPILLARY
Glucose-Capillary: 96 mg/dL (ref 70–99)
Glucose-Capillary: 138 mg/dL — ABNORMAL HIGH (ref 70–99)

## 2023-08-15 MED ORDER — GERHARDT'S BUTT CREAM
TOPICAL_CREAM | Freq: Two times a day (BID) | CUTANEOUS | Status: DC
  Filled 2023-08-15 (×4): qty 60

## 2023-08-15 NOTE — Progress Notes (Signed)
 [facility] Progress Note   Subjective:  Cr worse-  had another good day today.   Urology following-  plans for IP procedure Tuesday  -  reasonable UOP  Objective Vitals:   08/14/23 1256 08/14/23 2000 08/15/23 0500 08/15/23 0620  BP: 118/61 122/74  106/84  Pulse: 67 67  68  Resp: 16 17  18   Temp: 97.6 F (36.4 C) 98.1 F (36.7 C)  97.6 F (36.4 C)  TempSrc:      SpO2: 100% 100%  100%  Weight:   99 kg   Height:       Physical Exam General: sitting in chair Heart: RRR Lungs: clear Abdomen: soft Extremities: 2+ L tibial edema 2+ R tibial, LUE 2+ RUE 2+ -->  improved GU:  foley draining milky colored brownish urine  Additional Objective Labs: Basic Metabolic Panel: Recent Labs  Lab 08/12/23 0727 08/12/23 1628 08/13/23 0539 08/14/23 0611 08/15/23 1300  NA 130*   < > 132* 131* 131*  K 3.3*   < > 3.6 3.6 3.4*  CL 101   < > 103 107 105  CO2 13*   < > 15* 13* 14*  GLUCOSE 98   < > 101* 86 108*  BUN 77*   < > 75* 73* 70*  CREATININE 5.32*   < > 5.55* 5.49* 5.85*  CALCIUM 7.7*   < > 7.7* 8.1* 7.7*  PHOS 7.3*  --   --   --   --    < > = values in this interval not displayed.   Liver Function Tests: No results for input(s): "AST", "ALT", "ALKPHOS", "BILITOT", "PROT", "ALBUMIN" in the last 168 hours.  No results for input(s): "LIPASE", "AMYLASE" in the last 168 hours. CBC: Recent Labs  Lab 08/10/23 0521 08/11/23 0505 08/12/23 0727 08/13/23 0539 08/14/23 0611  WBC 53.2* 52.1* 40.4* 27.8* 26.8*  HGB 8.7* 9.4* 9.9* 8.5* 8.3*  HCT 26.9* 29.6* 30.8* 26.9* 26.1*  MCV 83.0 83.4 82.4 83.3 82.6  PLT 310 289 326 300 301   Blood Culture    Component Value Date/Time   SDES URINE, CATHETERIZED 08/04/2023 1408   SPECREQUEST  08/04/2023 1408    NONE Performed at [facility], [address], [place], [place] [address]    CULT >=100,000 COLONIES/mL YEAST (A) 08/04/2023 1408   REPTSTATUS 08/05/2023 FINAL 08/04/2023 1408    Cardiac Enzymes: No  results for input(s): "CKTOTAL", "CKMB", "CKMBINDEX", "TROPONINI" in the last 168 hours. CBG: Recent Labs  Lab 08/13/23 0600 08/14/23 0632 08/14/23 1200 08/14/23 2144 08/15/23 0640  GLUCAP 99 104* 121* 146* 96   Iron Studies: No results for input(s): "IRON", "TIBC", "TRANSFERRIN", "FERRITIN" in the last 72 hours. @lablastinr3 @ Studies/Results: No results found.   Medications:  ceFEPime (MAXIPIME) IV 200 mL/hr at 08/15/23 0214      Chlorhexidine Gluconate Cloth  6 each Topical BID   darbepoetin (ARANESP) injection - NON-DIALYSIS  100 mcg Subcutaneous Q Fri-1800   diclofenac Sodium  2 g Topical QID   fidaxomicin  200 mg Oral BID   iron polysaccharides  150 mg Oral Daily   multivitamin  1 tablet Oral QHS   Muscle Rub   Topical TID   psyllium  1 packet Oral BID   sodium bicarbonate  1,300 mg Oral TID   tamsulosin  0.8 mg Oral QPC supper   traMADol  50 mg Oral BID   traZODone  75 mg Oral QHS    Assessment/Plan:73yo M A fib, DM, A  fib on eliquis, nephrolithiasis s/p recent PCN for staghorn calculus currently admitted to CIR for debility and nephrology is reconsulted for AKI on CKD.  **AKI on CKD 4:   Baseline kidney function 05/2023 Cr 4.1 - 5.3 f/b CKA.  After CRRT stopped his creatinine improved to nadir of 3.2 on 3/19 and has gradually trended up 3/27 3.6 > 3/31 4.2 > 4/1 4.45 . Had staghorn calculus on L s/p PCN. Found to have obstructing stone on R s/p OR with ureteral stent 4/2.  Cr trending up further 5.14 > 5.4 4/6 - pending still today.  Remains nonoliguric with no uremic symptoms; no RRT needed at this time but discussed need may arise in coming days.  Give albumin 25g x 2 and lasix 60 x 2 again today and holding IVF.  Daily labs for now.  Strict I/Os.  Avoid hypotension and nephrotoxins.     -  CT scan today to rule out any other obstructive insults-  possibly  - may need to consider RRT in the coming days if nothing reversible identified.  - appreciate urology input-   for definitive procedure on 4/15- hoping that will get CKD back to baseline-  no absolute indications for HD   -  remove and replace Foley done on 4/10  **Anemia, normocytic:  Transfuse as needed for Hb < 7.  Having acute blood loss through foley but H/H stable.  Eliquis has been held.  On aranesp.   **Metabolic acidosis:  worsening in context of AKI; in light of worsening overload d/cd bicarb gtt and switched to oral.   **A fib:  eliquis on hold for acute bleeding  **Orthostatic hypotension:  has been on midodrine for the last few weeks, BPs look ok currently.   **Debility:  on CIR    Maritza Rolinda Impson A   08/15/2023, 11:47 AM  [employer]

## 2023-08-15 NOTE — Progress Notes (Signed)
 PROGRESS NOTE   Subjective/Complaints: No new complains from patient or his wife at bedside No issues overnight VSS  ROS: Patient denies fever, rash, sore throat, blurred vision, dizziness,  vomiting,   cough, shortness of breath or chest pain, headache, or mood change.  + Diarrhea - improved + poor appetite - improved     Objective:   No results found.      Recent Labs    08/13/23 0539 08/14/23 0611  WBC 27.8* 26.8*  HGB 8.5* 8.3*  HCT 26.9* 26.1*  PLT 300 301     Recent Labs    08/13/23 0539 08/14/23 0611  NA 132* 131*  K 3.6 3.6  CL 103 107  CO2 15* 13*  GLUCOSE 101* 86  BUN 75* 73*  CREATININE 5.55* 5.49*  CALCIUM 7.7* 8.1*      Intake/Output Summary (Last 24 hours) at 08/15/2023 1235 Last data filed at 08/15/2023 0214 Gross per 24 hour  Intake 198.89 ml  Output --  Net 198.89 ml         Physical Exam: Vital Signs Blood pressure 106/84, pulse 68, temperature 97.6 F (36.4 C), resp. rate 18, height 5\' 9"  (1.753 m), weight 99 kg, SpO2 100%.  Constitutional: No distress . Vital signs reviewed.  Sitting at edge of bed HEENT: NCAT, EOMI, oral membranes moist Neck: supple Cardiovascular: RRR without murmur. No JVD    Respiratory/Chest: CTA Bilaterally without wheezes or rales. Normal effort    GI/Abdomen: BS +,  tender, non-distended, tender to palp Ext: no clubbing, cyanosis, 2-3+ LE edema and UE edema Psych: pleasant and cooperative  GU: Foley draining urine-still little dark but improved from yesterday    Skin: No evidence of breakdown, no evidence of rash Neurologic: Cranial nerves II through XII grossly intact, motor strength is moving all 4 extremities to gravity and resistance    MSK: Bilateral knee effusions ongoing. Less pain with ROM. Knee sleeves in place Left shoulder tight in ER/IR/F/E            Assessment/Plan: 1. Functional deficits which require 3+ hours per day  of interdisciplinary therapy in a comprehensive inpatient rehab setting. Physiatrist is providing close team supervision and 24 hour management of active medical problems listed below. Physiatrist and rehab team continue to assess barriers to discharge/monitor patient progress toward functional and medical goals  Care Tool:  Bathing  Bathing activity did not occur:  (patient completed a simulated task at bed LOF) Body parts bathed by patient: Right arm, Left arm, Chest, Abdomen, Front perineal area, Right upper leg, Left upper leg, Face   Body parts bathed by helper: Buttocks, Left lower leg, Right lower leg     Bathing assist Assist Level: Maximal Assistance - Patient 24 - 49%     Upper Body Dressing/Undressing Upper body dressing   What is the patient wearing?: Pull over shirt    Upper body assist Assist Level: Minimal Assistance - Patient > 75%    Lower Body Dressing/Undressing Lower body dressing      What is the patient wearing?: Pants, Incontinence brief     Lower body assist Assist for lower body dressing: Total Assistance - Patient <  25%     Editor, commissioning assist Assist for toileting: 2 Helpers     Transfers Chair/bed transfer  Transfers assist  Chair/bed transfer activity did not occur: Safety/medical concerns (not safe to get up)  Chair/bed transfer assist level: 2 Helpers     Locomotion Ambulation   Ambulation assist   Ambulation activity did not occur: Safety/medical concerns  Assist level: Dependent - Patient 0% Assistive device: Other (comment) (sara plus) Max distance: 18 steps   Walk 10 feet activity   Assist  Walk 10 feet activity did not occur: Safety/medical concerns        Walk 50 feet activity   Assist Walk 50 feet with 2 turns activity did not occur: Safety/medical concerns         Walk 150 feet activity   Assist Walk 150 feet activity did not occur: Safety/medical concerns         Walk 10  feet on uneven surface  activity   Assist Walk 10 feet on uneven surfaces activity did not occur: Safety/medical concerns         Wheelchair     Assist Is the patient using a wheelchair?: Yes Type of Wheelchair: Power    Wheelchair assist level: Supervision/Verbal cueing Max wheelchair distance: 300+    Wheelchair 50 feet with 2 turns activity    Assist        Assist Level: Supervision/Verbal cueing   Wheelchair 150 feet activity     Assist      Assist Level: Supervision/Verbal cueing   Blood pressure 106/84, pulse 68, temperature 97.6 F (36.4 C), resp. rate 18, height 5\' 9"  (1.753 m), weight 99 kg, SpO2 100%.   Medical Problem List and Plan: 1. Functional deficits secondary to debility related to septic shock/C. difficile colitis/AKI superimposed on CKD stage IV             -patient may shower with tubes/drains covered             -ELOS/Goals:  3/28, min assist PT/OT             -Continue CIR therapies including PT, OT   -Team meeting scheduled for Tuesday  -family/ team meeting  Tuesday at 1 PM.  -Daily therapy for now  -Family meeting completed yesterday  -Will call insurance peer to peer-called and scheduled 2 time periods for call back  -Exp DC 4/18 currently    2.  Antithrombotics: -DVT/anticoagulation:  Pharmaceutical: Eliquis             -antiplatelet therapy: N/A   3. Pain Management: tramadol prn, Zanaflex 4 mg twice daily as needed   Biliateral OA of knees  -ordered bilateral neoprine knee sleeves --try today  -voltaren gel tid to knees  -have scheduled tramadol 50mg  at 0700 and 1200 daily. Continue prn as well  Mild adhesive capsulitis left shoulder---  sports cream. Can use voltaren gel also. Aggressive ROM with therapy and while in bed--continue  -Will check right knee x-ray, left knee x-ray on 3/8 with advanced OA.  Will consult orthopedics for cortisone injections  -3-28: Orthopedics aspirated and injected left knee, with  improvement.  Ongoing pain with right knee.  3/31 Knee fluid calcium pyrophosphate cyrystals, pseudogout?, cultures with no growth  -4/2 hydrocodone PRN started yesterday for flank pain  4/3 reconsult orthopedics, will come up and talk to him today about his right knee.  I had spoken with orthopedics on 4/1 are considering oral steroids due to  crystals, pseudogout?  But held off due to more acute medical issues  4/4 right knee is feeling better continue to monitor  4/5 continues to be improved although has not had therapy today  4/6-11 reports knee pain is better    4. Mood/Behavior/Sleep: Trazodone 50 mg nightly as needed.  Provide emotional support             -antipsychotic agents: N/A   5. Neuropsych/cognition: This patient is capable of making decisions on his own behalf.   6. Skin/Wound Care: Routine skin checks   7. Fluids/Electrolytes/Nutrition:    -3/14 po intake remains poor, albumin low, weights falling  -will ask RD for assistance. Have spoken to patient about intake  -?IV albumin (see below)  3/18 discussed with dietitian, patient recorded is eating 0% of his meals.  Appears that wife bringing in 3 meals a day and he is eating about 75%, appears to have fairly adequate intake.  3/27 eating frequent outside foods-doesn't like hospital food as much  4/7 encouraged pt to eat any food he feels like. More important to just get calories in at this point  4/11 appetite is improving  8.  Oliguric AKI superimposed on CKD stage IV.  Baseline creatinine 4.9.  CRRT discontinued 2/25.  No current plan for long-term dialysis.  Follow-up renal services              - Per discussion with nephrology Dr. America Bake, keep Foley catheter in to allow accurate I's and O's through Monday; then, can remove and do DC Foley trial while continuing to document strict outputs             - Continue daily assessments of renal function per nephrology   3/8- per renal, Cr leveling out- and con't Foley- will  remove Monday  -nephrology following, Cr remains in 4's.   3/14 renal function with some improvement today     -suspect lower extremity edema is related to renal function/low albumin   Appreciate nephro input   -3/17 BUN/creatinine slightly improved 34/3.31, appreciate nephrology assistance  3/19 BUN/CR stable at 34/3.24, nephrology signing off, ok to check BMET twice a week  3/23 encouraging po this weekend. happy that he's eating food from home  3/24 BUN/CR overall stable at 40/3.26  -3/31 BUN/Cr a little higher today, IVF 61ml/hr  -4/1 Cr a little higher again, will contact nephrology  -4/3 nephrology note reviewed, IV Lasix discontinued and he was given some Lasix today, changed oral bicarb by nephrology, can give IV as needed  4/4 patient has a lot of edema, nephrology reducing Lasix today and holding IVF creatinine higher at 5.14  4/5 reviewed nephrology note, patient getting albumin and Lasix today  4/7 labs reviewed, similar to yesterday, ongoing mgt per nephro  4/8 Cr stable for past few days, CT ordered by nephrology to r/o other obstructive issue  4/9 discussed CT scan results with urology by phone, no interventions recommended at this time. Cr stable. Will await formal note for recommendations.   4/10 nephrology has ordered removal/replace foley, may need RRT-appears urology recommended to keep current Foley in place 9.  C. difficile colitis.  Completed course of oral vancomycin 3/7.  DC'd IV Flagyl 3/3. Enteric precautions   3/14 stools formed this morning   3/17 discussed with ID pharmacy, patient having more frequent bowel movements.  Monitor today and if continues consider Dificid treatment  3/18 diarrhea appears to be improving, continue to monitor for now  3/19 Frequent Bms  improved, continue to follow  3/21 having more frequent bowel movements again today, continue to monitor and if frequent diarrhea persists this weekend consider treatment with Dificid  3/23- mushy/lqiuid  stool again overnight    -no dificid at this point. Discuss with GI first on Monday    -added fiber to regimen yesterday, will increase to bid today  3/24 mushy/ liquid BMs, about 2 a day yesterday, more other days- will discuss GI  3/25 GI recommended repeat C. difficile screening. Discussed with Arzella Laurence, hold off on repeat screening test, he only had about 2 bowel movements a day but if continues to have frequent BMs consider retreatment with oral vancomycin for 10 days  3/26 pt reports Bms more firm, continue to monitor   3/28 patient reports BMs have been more solid, continue to monitor.  If worsening diarrhea occurs consider retreatment with vancomycin oral for 10 days  3-29: Diarrhea this a.m., no bowel movements this afternoon recorded  3-30: No BM since yesterday.  3-31 diarrhea continues to be improved overall  4-3 LBM yesterday, reports diarrhea continues to be improved  -4/4 patient with increased diarrhea, discussed with hospitalist and ID.  Restarted oral vancomycin 125 mg 4 times a day for 10 days  -4/5 continue vancomycin oral, hospitalist following appreciate assistance  -4/6 discussed with ID today, will change to Dificid  -4/7 WBC's sl improved to 53k today. Continue fidaxomicin, fiber,etc   -appreciate ID f/u  4/8 WBC a little better today 52.1, continue to monitor. Still having frequent Bms  4/9 WBC improved to 40.4, continue to monitor. Diarrhea a little better  4/10 WBC decreased 27.8 Much improved, continue current regimen  4/11 WBC a little better again today to 26.8 10.  Status post left nephrostomy tube d/t staghorn calculi. Has Foley Nephrostomy tube placed 1/29 per IR with plan for ureteric stent placement 3/21              - Careful monitoring of nephrostomy output  -3/14 flomax has been added to improve emptying  -3/21 patient scheduled today for cystoscopy with left retrograde pyelogram, left ureteroscopy laser lithotripsy and left ureteral stent  placement.     3/22- procedure appears successful thus far.    -urine clear, patient comfortable   -foley to come out on Monday per urology  3/25 Foley was removed patient continues to have urinary retention requiring IC.  Increase Flomax to 0.8 mg  3/26 intermittent blood noted with IC, if continues will restart foley  3/27 Urine no longer blood tinged, continue current regimen, consider restart foley if reoccurs  3/27 continues to require catheterization but no longer blood-tinged Addendum 3/28 foley started after traumatic cath, possible false passage, continue foley for now, f/u with urology outpatient 4/1 foley with hematuria, denies known trauma to this, will ask nursing to check bladder scan. U/A culture ordered.  Called his urologist to discuss 4/2 CT abdomen/pelvis-bilateral obstructive uropathy with collecting system hematuria, large calculus in right ureter and pelvic inlet, new left hydronephrosis, new anasarca, liver nodules raising possibility of cirrhosis.  Discussed patient with urology - surgical procedure today  Addendum discussed with urology, will hold eliquis for now due to bleeding, check EKG- A flutter -4/3 cystoscopy, right retrograde pyelogram, right ureteral stent insertion yesterday by Dr. Willye Harvey, further procedure scheduled on 4/15 with Dr. Derrick Fling.  Discussed with [facility] urology.  Do not think traumatic cath last week caused recurrent bleeding his kidney, his wife was concerned about this.  Patient was  having nonbloody urine for several days on the Foley.  Will continue Foley and not attempt intermittent cath.  Patient suspected to have false passage. 4/4 urine appears less dark today, urology considering TXA tomorrow if bleeding is not resolved, Eliquis on hold 4/5 appreciate urology assistance, bleeding does appear to be improving, only blood-tinged urine in Foley tubing.  Consider restart Eliquis when 4/6 Discussed with urology,  bleeding improving,  hold off on TXA, continue to hold eliquis for today due to high risk of bleeding 4/7- urine appears to be clearing although hgb down to 8.7 today  -continue serial labs  -don' think TXA is indicated right now  -would keep eliquis on hold 4/8 Continue to hold eliques, could consider heparin trail- discuss with hospitalist- CT scan results before considering 4/9 Will check EKG to see if still having afib/flutter, consider heparin infusion, will hold off today as urine looks a little pink/blood tinged 4/10 Dark urine today, Pt was seen by urology, continue to monitor 4/11 Urine less dark today, continue to monitor  11.  Chronic atrial fibrillation with episodes of bradycardia.  Followed by cardiology services.  Continue Eliquis.  Avoid AV nodal blocking agents -4/11 cardiology consulted appreciate assistance, rate control.  Continue to hold anticoagulation for now.  Cardiology may consider left atrial appendage occlusion device after discharge.   12.  Diabetes mellitus.  Hemoglobin A1c 6.2.    -tightly controlled. Dc SSI and change cbg checks to qam only   CBG (last 3)  Recent Labs    08/14/23 1200 08/14/23 2144 08/15/23 0640  GLUCAP 121* 146* 96       3-30: Recently increased.  Somewhat expected with intra-articular steroid injection.  Continue to trend.  4/5-7 controlled, continue to monitor   controlled, continue to monitor   13.  Hypotension.  Continue midodrine to 5mg  TID PRN orthostatic hypotension       08/15/2023    6:20 AM 08/15/2023    5:00 AM 08/14/2023    8:00 PM  Vitals with BMI  Weight  218 lbs 4 oz   BMI  32.22   Systolic 106  122  Diastolic 84  74  Pulse 68  67    14.  Chronic anemia.  Continue Niferex daily/Aranesp.  Monitor for any bleeding episodes   3/8- Hb 7.1 today- will order transfusion- transfusion of 1 unit pRBCs.   3/14 hgb up to 9.1  -3/17 hemoglobin down to 8.4, continue to monitor  -3/19 HGB 8.3 today, stable overall, continue to monitor     -3/27 HGB 7.7- a little lower likely due to GU procedure/hematuria, continue to monitor  3/31 HGB 7.6 today, transfuse 7 or less  4/7 hgb down to 8.7. hematuria better--continue to monitor serially   16. Leukocytosis  3/9- Pt's WBC is 21.7- up from 16k- is afebrile- due to his recent C diff, that just finished correct dosing of ABX for, called ID for guidance on treatment- don't want to cause more Cdiff- ID consulted ---no new orders  3/10 WBC's down to 17k  --have been hovering above and below 20 for awhile. No new clinical symptoms. Nephrostomy drainage clear, yellow  3/14 wbc's down to 13k.--recheck Monday   3/17 WBC is down to 12.4  3/19 WBC 13.4 today, continue to monitor  3/24 WBC higher today 18.7, Discuss C diff treatment with GI a above 3/31 WBC still elevated 19.0, monitor for signs of infection 4/3 WBC still elevated to 23.8, potentially due to his urological issues  and procedure.  Hospitalist also following appreciate assistance  4/4 WBC higher today, oral vancomycin started as above.  Discussed with ID Lalla Pill and hospitalist.  Patient had Pseudomonas resulting in prior urine culture.  Recommended cefepime for approximately 10 days depending on WBC.  Called pharmacy for cefepime dosing.  ID recommended replacing Foley due to yeast on last culture, discussed with urology who indicates this was already done so hold off on changing again.  4/5 continue current antibiotic regimen, hospitalist team following.  WBC higher today-discussed with hospitalist yesterday, often see higher WBCs with C. difficile.  Continue monitor trend daily.  Patient remains afebrile and vital stable  4/7 WBC's 53k, sl improvement   -continue fidaxomicin   -appreciate ID help   -serial labs  4/10 WBC improved to 27  17. Decreased mood reported by therapy  -Consider SSRI  -3/20 patient reports his mood is okay, denies depression this morning.  Continue to monitor  18. Insomnia   Continue  trazodone  19. LE edema  -Significantly improved on the left after steroid injection and effusion tap.  -IVF discontinued as above  - Nephrology following, treating with Lasix, albumin  -4/7 edema with some improvement. albumin /lasix per nephro   -elevate/compression   -improve nutrition Weight reviewed and is stable  20. Low MG and potassium  -4/8 mg and K+ repletion today  -4/9 addition K+ was ordered  4/11 K+ stable 3.6, MG 2.0    Mobility Assessment  Tavien Chestnut was seen for the purpose of a mobility assessment for a powered wheelchair. I have reviewed and agree with the detailed PT evaluation. Ambrosio Junker Rinck suffers from chronic weakness, joint pain  related to DM, CKD, Knee OA, A fib. Due to joint pain, decreased endurance and obesity he is unable to utilize a cane, walker, manual wheelchair, or scooter. The patient is appropriate for a customized power wheelchair.  With a power wheelchair  Bertin Bushong can move independently at a household level and on a limited basis in the community. The chair will also allow the patient to perform ADL's more easily. The patient is competent to operate the recommended chair on his own and is motivated to utilize the chair on a daily basis.    LOS: 36 days A FACE TO FACE EVALUATION WAS PERFORMED  Liam Redhead 08/15/2023, 12:35 PM

## 2023-08-16 LAB — RENAL FUNCTION PANEL
Albumin: 2 g/dL — ABNORMAL LOW (ref 3.5–5.0)
Anion gap: 13 (ref 5–15)
BUN: 71 mg/dL — ABNORMAL HIGH (ref 8–23)
CO2: 13 mmol/L — ABNORMAL LOW (ref 22–32)
Calcium: 8 mg/dL — ABNORMAL LOW (ref 8.9–10.3)
Chloride: 107 mmol/L (ref 98–111)
Creatinine, Ser: 5.85 mg/dL — ABNORMAL HIGH (ref 0.61–1.24)
GFR, Estimated: 10 mL/min — ABNORMAL LOW (ref >60)
Glucose, Bld: 88 mg/dL (ref 70–99)
Phosphorus: 7.2 mg/dL — ABNORMAL HIGH (ref 2.5–4.6)
Potassium: 3.4 mmol/L — ABNORMAL LOW (ref 3.5–5.1)
Sodium: 133 mmol/L — ABNORMAL LOW (ref 135–145)

## 2023-08-16 LAB — GLUCOSE, CAPILLARY: Glucose-Capillary: 89 mg/dL (ref 70–99)

## 2023-08-16 NOTE — Progress Notes (Signed)
 Occupational Therapy Session Note  Patient Details  Name: Calvin Schultz MRN: [id number] Date of Birth: 05/15/1950  {CHL IP REHAB OT TIME CALCULATIONS:[id number]}   Short Term Goals: Week 5:  OT Short Term Goal 1 (Week 5): Pt will consistently demonstrate lateral scoot transfer at Min A with use of SB when transferring from surface to surface. OT Short Term Goal 2 (Week 5): Pt will complete toileting with Mod A while utilizing drop arm BSC. OT Short Term Goal 3 (Week 5): Pt will complete bed mobility while transitioning from supine to seated EOB with Mod A in order to participate in self care tasks and OOB activities.  Skilled Therapeutic Interventions/Progress Updates:    Patient agreeable to participate in OT session. Reports *** pain level.   Patient participated in skilled OT session focusing on ***. Therapist facilitated/assessed/developed/educated/integrated/elicited *** in order to improve/facilitate/promote    Therapy Documentation Precautions:  Precautions Precautions: Fall Precaution/Restrictions Comments: Foley, bilateral knee pain and decreased ROM Restrictions Weight Bearing Restrictions Per Provider Order: No   Therapy/Group: Individual Therapy  Carollee Circle, OTR/L,CBIS  Supplemental OT - [facility] and [facility] Secure Chat Preferred   08/16/2023, 9:27 PM

## 2023-08-16 NOTE — Plan of Care (Signed)
  Problem: Consults Goal: RH GENERAL PATIENT EDUCATION Description: See Patient Education module for education specifics. Outcome: Progressing   Problem: RH BOWEL ELIMINATION Goal: RH STG MANAGE BOWEL WITH ASSISTANCE Description: STG Manage Bowel withtoileting Assistance. Outcome: Progressing   Problem: RH BLADDER ELIMINATION Goal: RH STG MANAGE BLADDER WITH ASSISTANCE Description: STG Manage Bladder With equipment/min Assistance Outcome: Progressing   Problem: RH SKIN INTEGRITY Goal: RH STG SKIN FREE OF INFECTION/BREAKDOWN Description: Maintain skin free of infection with min assist Outcome: Progressing Goal: RH STG MAINTAIN SKIN INTEGRITY WITH ASSISTANCE Description: STG Maintain Skin Integrity With min Assistance. Outcome: Progressing   Problem: RH SAFETY Goal: RH STG ADHERE TO SAFETY PRECAUTIONS W/ASSISTANCE/DEVICE Description: STG Adhere to Safety Precautions With cues Assistance/Device. Outcome: Progressing   Problem: RH PAIN MANAGEMENT Goal: RH STG PAIN MANAGED AT OR BELOW PT'S PAIN GOAL Description: < 4 with prns Outcome: Progressing   Problem: RH KNOWLEDGE DEFICIT GENERAL Goal: RH STG INCREASE KNOWLEDGE OF SELF CARE AFTER HOSPITALIZATION Description: Patient and spouse will be able to manage care using educational resources for medications and dietary modification independently Outcome: Progressing

## 2023-08-16 NOTE — Progress Notes (Signed)
 PROGRESS NOTE   Subjective/Complaints: No new complaints this morning Patient's chart reviewed- No issues reported overnight Vitals signs stable   ROS: Patient denies fever, rash, sore throat, blurred vision, dizziness,  vomiting,   cough, shortness of breath or chest pain, headache, or mood change.  + Diarrhea - improved + poor appetite -improved     Objective:   No results found.      Recent Labs    08/14/23 0611 08/15/23 1300  WBC 26.8* 24.8*  HGB 8.3* 8.6*  HCT 26.1* 27.5*  PLT 301 289     Recent Labs    08/14/23 0611 08/15/23 1300  NA 131* 131*  K 3.6 3.4*  CL 107 105  CO2 13* 14*  GLUCOSE 86 108*  BUN 73* 70*  CREATININE 5.49* 5.85*  CALCIUM 8.1* 7.7*      Intake/Output Summary (Last 24 hours) at 08/16/2023 0936 Last data filed at 08/15/2023 2157 Gross per 24 hour  Intake --  Output 1100 ml  Net -1100 ml         Physical Exam: Vital Signs Blood pressure (!) 133/59, pulse 94, temperature 98 F (36.7 C), temperature source Oral, resp. rate 16, height 5\' 9"  (1.753 m), weight 99 kg, SpO2 100%.  Constitutional: No distress . Vital signs reviewed.  Sitting at edge of bed HEENT: NCAT, EOMI, oral membranes moist Neck: supple Cardiovascular: RRR without murmur. No JVD    Respiratory/Chest: CTA Bilaterally without wheezes or rales. Normal effort    GI/Abdomen: BS +,  tender, non-distended, tender to palp Ext: no clubbing, cyanosis, 2-3+ LE edema and UE edema Psych: pleasant and cooperative  GU: Foley draining urine-still little dark but improved from yesterday    Skin: No evidence of breakdown, no evidence of rash Neurologic: Cranial nerves II through XII grossly intact, motor strength is moving all 4 extremities to gravity and resistance    MSK: Bilateral knee effusions ongoing. Less pain with ROM. Knee sleeves in place Left shoulder tight in ER/IR/F/E, stable 4/13      Assessment/Plan: 1. Functional deficits which require 3+ hours per day of interdisciplinary therapy in a comprehensive inpatient rehab setting. Physiatrist is providing close team supervision and 24 hour management of active medical problems listed below. Physiatrist and rehab team continue to assess barriers to discharge/monitor patient progress toward functional and medical goals  Care Tool:  Bathing  Bathing activity did not occur:  (patient completed a simulated task at bed LOF) Body parts bathed by patient: Right arm, Left arm, Chest, Abdomen, Front perineal area, Right upper leg, Left upper leg, Face   Body parts bathed by helper: Buttocks, Left lower leg, Right lower leg     Bathing assist Assist Level: Maximal Assistance - Patient 24 - 49%     Upper Body Dressing/Undressing Upper body dressing   What is the patient wearing?: Pull over shirt    Upper body assist Assist Level: Minimal Assistance - Patient > 75%    Lower Body Dressing/Undressing Lower body dressing      What is the patient wearing?: Pants, Incontinence brief     Lower body assist Assist for lower body dressing: Total Assistance - Patient <  25%     Editor, commissioning assist Assist for toileting: 2 Helpers     Transfers Chair/bed transfer  Transfers assist  Chair/bed transfer activity did not occur: Safety/medical concerns (not safe to get up)  Chair/bed transfer assist level: 2 Helpers     Locomotion Ambulation   Ambulation assist   Ambulation activity did not occur: Safety/medical concerns  Assist level: Dependent - Patient 0% Assistive device: Other (comment) (sara plus) Max distance: 18 steps   Walk 10 feet activity   Assist  Walk 10 feet activity did not occur: Safety/medical concerns        Walk 50 feet activity   Assist Walk 50 feet with 2 turns activity did not occur: Safety/medical concerns         Walk 150 feet activity   Assist Walk 150  feet activity did not occur: Safety/medical concerns         Walk 10 feet on uneven surface  activity   Assist Walk 10 feet on uneven surfaces activity did not occur: Safety/medical concerns         Wheelchair     Assist Is the patient using a wheelchair?: Yes Type of Wheelchair: Power    Wheelchair assist level: Supervision/Verbal cueing Max wheelchair distance: 300+    Wheelchair 50 feet with 2 turns activity    Assist        Assist Level: Supervision/Verbal cueing   Wheelchair 150 feet activity     Assist      Assist Level: Supervision/Verbal cueing   Blood pressure (!) 133/59, pulse 94, temperature 98 F (36.7 C), temperature source Oral, resp. rate 16, height 5\' 9"  (1.753 m), weight 99 kg, SpO2 100%.   Medical Problem List and Plan: 1. Functional deficits secondary to debility related to septic shock/C. difficile colitis/AKI superimposed on CKD stage IV             -patient may shower with tubes/drains covered             -ELOS/Goals:  3/28, min assist PT/OT             -Continue CIR therapies including PT, OT   -Team meeting scheduled for Tuesday  -family/ team meeting  Tuesday at 1 PM.  -Daily therapy for now  -Family meeting completed yesterday  -Will call insurance peer to peer-called and scheduled 2 time periods for call back  -Exp DC 4/18 currently    2.  Antithrombotics: -DVT/anticoagulation:  Pharmaceutical: Eliquis             -antiplatelet therapy: N/A   3. Pain Management: tramadol prn, Zanaflex 4 mg twice daily as needed   Biliateral OA of knees  -ordered bilateral neoprine knee sleeves --try today  -voltaren gel tid to knees  -have scheduled tramadol 50mg  at 0700 and 1200 daily. Continue prn as well  Mild adhesive capsulitis left shoulder---  sports cream. Can use voltaren gel also. Aggressive ROM with therapy and while in bed--continue  -Will check right knee x-ray, left knee x-ray on 3/8 with advanced OA.  Will consult  orthopedics for cortisone injections  -3-28: Orthopedics aspirated and injected left knee, with improvement.  Ongoing pain with right knee.  3/31 Knee fluid calcium pyrophosphate cyrystals, pseudogout?, cultures with no growth  -4/2 hydrocodone PRN started yesterday for flank pain  4/3 reconsult orthopedics, will come up and talk to him today about his right knee.  I had spoken with orthopedics on 4/1 are considering  oral steroids due to crystals, pseudogout?  But held off due to more acute medical issues  4/4 right knee is feeling better continue to monitor  4/5 continues to be improved although has not had therapy today  4/6-11 reports knee pain is better    4. Mood/Behavior/Sleep: Trazodone 50 mg nightly as needed.  Provide emotional support             -antipsychotic agents: N/A   5. Neuropsych/cognition: This patient is capable of making decisions on his own behalf.   6. Skin/Wound Care: Routine skin checks   7. Fluids/Electrolytes/Nutrition:    -3/14 po intake remains poor, albumin low, weights falling  -will ask RD for assistance. Have spoken to patient about intake  -?IV albumin (see below)  3/18 discussed with dietitian, patient recorded is eating 0% of his meals.  Appears that wife bringing in 3 meals a day and he is eating about 75%, appears to have fairly adequate intake.  3/27 eating frequent outside foods-doesn't like hospital food as much  4/7 encouraged pt to eat any food he feels like. More important to just get calories in at this point  4/11 appetite is improving  8.  Oliguric AKI superimposed on CKD stage IV.  Baseline creatinine 4.9.  CRRT discontinued 2/25.  No current plan for long-term dialysis.  Follow-up renal services              - Per discussion with nephrology Dr. America Bake, keep Foley catheter in to allow accurate I's and O's through Monday; then, can remove and do DC Foley trial while continuing to document strict outputs             - Continue daily  assessments of renal function per nephrology   3/8- per renal, Cr leveling out- and con't Foley- will remove Monday  -nephrology following, Cr remains in 4's.   3/14 renal function with some improvement today     -suspect lower extremity edema is related to renal function/low albumin   Appreciate nephro input   -3/17 BUN/creatinine slightly improved 34/3.31, appreciate nephrology assistance  3/19 BUN/CR stable at 34/3.24, nephrology signing off, ok to check BMET twice a week  3/23 encouraging po this weekend. happy that he's eating food from home  3/24 BUN/CR overall stable at 40/3.26  -3/31 BUN/Cr a little higher today, IVF 53ml/hr  -4/1 Cr a little higher again, will contact nephrology  -4/3 nephrology note reviewed, IV Lasix discontinued and he was given some Lasix today, changed oral bicarb by nephrology, can give IV as needed  4/4 patient has a lot of edema, nephrology reducing Lasix today and holding IVF creatinine higher at 5.14  4/5 reviewed nephrology note, patient getting albumin and Lasix today  4/7 labs reviewed, similar to yesterday, ongoing mgt per nephro  4/8 Cr stable for past few days, CT ordered by nephrology to r/o other obstructive issue  4/9 discussed CT scan results with urology by phone, no interventions recommended at this time. Cr stable. Will await formal note for recommendations.   4/10 nephrology has ordered removal/replace foley, may need RRT-appears urology recommended to keep current Foley in place 9.  C. difficile colitis.  Completed course of oral vancomycin 3/7.  DC'd IV Flagyl 3/3. Enteric precautions   3/14 stools formed this morning   3/17 discussed with ID pharmacy, patient having more frequent bowel movements.  Monitor today and if continues consider Dificid treatment  3/18 diarrhea appears to be improving, continue to monitor for now  3/19 Frequent Bms improved, continue to follow  3/21 having more frequent bowel movements again today, continue to  monitor and if frequent diarrhea persists this weekend consider treatment with Dificid  3/23- mushy/lqiuid stool again overnight    -no dificid at this point. Discuss with GI first on Monday    -added fiber to regimen yesterday, will increase to bid today  3/24 mushy/ liquid BMs, about 2 a day yesterday, more other days- will discuss GI  3/25 GI recommended repeat C. difficile screening. Discussed with Arzella Laurence, hold off on repeat screening test, he only had about 2 bowel movements a day but if continues to have frequent BMs consider retreatment with oral vancomycin for 10 days  3/26 pt reports Bms more firm, continue to monitor   3/28 patient reports BMs have been more solid, continue to monitor.  If worsening diarrhea occurs consider retreatment with vancomycin oral for 10 days  3-29: Diarrhea this a.m., no bowel movements this afternoon recorded  3-30: No BM since yesterday.  3-31 diarrhea continues to be improved overall  4-3 LBM yesterday, reports diarrhea continues to be improved  -4/4 patient with increased diarrhea, discussed with hospitalist and ID.  Restarted oral vancomycin 125 mg 4 times a day for 10 days  -4/5 continue vancomycin oral, hospitalist following appreciate assistance  -4/6 discussed with ID today, will change to Dificid  -4/7 WBC's sl improved to 53k today. Continue fidaxomicin, fiber,etc   -appreciate ID f/u  4/8 WBC a little better today 52.1, continue to monitor. Still having frequent Bms  4/9 WBC improved to 40.4, continue to monitor. Diarrhea a little better  4/10 WBC decreased 27.8 Much improved, continue current regimen  4/11 WBC a little better again today to 26.8 10.  Status post left nephrostomy tube d/t staghorn calculi. Has Foley Nephrostomy tube placed 1/29 per IR with plan for ureteric stent placement 3/21              - Careful monitoring of nephrostomy output  -3/14 flomax has been added to improve emptying  -3/21 patient scheduled today for  cystoscopy with left retrograde pyelogram, left ureteroscopy laser lithotripsy and left ureteral stent placement.     3/22- procedure appears successful thus far.    -urine clear, patient comfortable   -foley to come out on Monday per urology  3/25 Foley was removed patient continues to have urinary retention requiring IC.  Increase Flomax to 0.8 mg  3/26 intermittent blood noted with IC, if continues will restart foley  3/27 Urine no longer blood tinged, continue current regimen, consider restart foley if reoccurs  3/27 continues to require catheterization but no longer blood-tinged Addendum 3/28 foley started after traumatic cath, possible false passage, continue foley for now, f/u with urology outpatient 4/1 foley with hematuria, denies known trauma to this, will ask nursing to check bladder scan. U/A culture ordered.  Called his urologist to discuss 4/2 CT abdomen/pelvis-bilateral obstructive uropathy with collecting system hematuria, large calculus in right ureter and pelvic inlet, new left hydronephrosis, new anasarca, liver nodules raising possibility of cirrhosis.  Discussed patient with urology - surgical procedure today  Addendum discussed with urology, will hold eliquis for now due to bleeding, check EKG- A flutter -4/3 cystoscopy, right retrograde pyelogram, right ureteral stent insertion yesterday by Dr. Willye Harvey, further procedure scheduled on 4/15 with Dr. Derrick Fling.  Discussed with [facility] urology.  Do not think traumatic cath last week caused recurrent bleeding his kidney, his wife was concerned about this.  Patient was having nonbloody urine for several days on the Foley.  Will continue Foley and not attempt intermittent cath.  Patient suspected to have false passage. 4/4 urine appears less dark today, urology considering TXA tomorrow if bleeding is not resolved, Eliquis on hold 4/5 appreciate urology assistance, bleeding does appear to be improving, only blood-tinged  urine in Foley tubing.  Consider restart Eliquis when 4/6 Discussed with urology,  bleeding improving, hold off on TXA, continue to hold eliquis for today due to high risk of bleeding 4/7- urine appears to be clearing although hgb down to 8.7 today  -continue serial labs  -don' think TXA is indicated right now  -would keep eliquis on hold 4/8 Continue to hold eliques, could consider heparin trail- discuss with hospitalist- CT scan results before considering 4/9 Will check EKG to see if still having afib/flutter, consider heparin infusion, will hold off today as urine looks a little pink/blood tinged 4/10 Dark urine today, Pt was seen by urology, continue to monitor 4/11 Urine less dark today, continue to monitor  11.  Chronic atrial fibrillation with episodes of bradycardia.  Followed by cardiology services.  Continue Eliquis.  Avoid AV nodal blocking agents -4/11 cardiology consulted appreciate assistance, rate control.  Continue to hold anticoagulation for now.  Cardiology may consider left atrial appendage occlusion device after discharge.   12.  Diabetes mellitus.  Hemoglobin A1c 6.2.    -tightly controlled. Dc SSI and change cbg checks to qam only   CBG (last 3)  Recent Labs    08/15/23 0640 08/15/23 2135 08/16/23 0711  GLUCAP 96 138* 89       3-30: Recently increased.  Somewhat expected with intra-articular steroid injection.  Continue to trend.  4/5-7 controlled, continue to monitor   controlled, continue to monitor   13.  Hypotension.  continue midodrine to 5mg  TID PRN orthostatic hypotension       08/16/2023    4:41 AM 08/16/2023    3:14 AM 08/15/2023    8:28 PM  Vitals with BMI  Weight 218 lbs 4 oz    BMI 32.22    Systolic  133 107  Diastolic  59 67  Pulse  94 86    14.  Chronic anemia.  continue Niferex daily/Aranesp.  Monitor for any bleeding episodes   3/8- Hb 7.1 today- will order transfusion- transfusion of 1 unit pRBCs.   3/14 hgb up to 9.1  -3/17  hemoglobin down to 8.4, continue to monitor  -3/19 HGB 8.3 today, stable overall, continue to monitor    -3/27 HGB 7.7- a little lower likely due to GU procedure/hematuria, continue to monitor  3/31 HGB 7.6 today, transfuse 7 or less  4/7 hgb down to 8.7. hematuria better--continue to monitor serially   16. Leukocytosis  3/9- Pt's WBC is 21.7- up from 16k- is afebrile- due to his recent C diff, that just finished correct dosing of ABX for, called ID for guidance on treatment- don't want to cause more Cdiff- ID consulted ---no new orders  3/10 WBC's down to 17k  --have been hovering above and below 20 for awhile. No new clinical symptoms. Nephrostomy drainage clear, yellow  3/14 wbc's down to 13k.--recheck Monday   3/17 WBC is down to 12.4  3/19 WBC 13.4 today, continue to monitor  3/24 WBC higher today 18.7, Discuss C diff treatment with GI a above 3/31 WBC still elevated 19.0, monitor for signs of infection 4/3 WBC still elevated to 23.8, potentially due to his  urological issues and procedure.  Hospitalist also following appreciate assistance  4/4 WBC higher today, oral vancomycin started as above.  Discussed with ID Lalla Pill and hospitalist.  Patient had Pseudomonas resulting in prior urine culture.  Recommended cefepime for approximately 10 days depending on WBC.  Called pharmacy for cefepime dosing.  ID recommended replacing Foley due to yeast on last culture, discussed with urology who indicates this was already done so hold off on changing again.  4/5 continue current antibiotic regimen, hospitalist team following.  WBC higher today-discussed with hospitalist yesterday, often see higher WBCs with C. difficile.  Continue monitor trend daily.  Patient remains afebrile and vital stable  4/7 WBC's 53k, sl improvement   -continue fidaxomicin   -appreciate ID help   -serial labs  4/10 WBC improved to 27  17. Decreased mood reported by therapy  -Consider SSRI  -3/20 patient reports  his mood is okay, denies depression this morning.  continue to monitor  18. Insomnia   continue trazodone  19. LE edema  -Significantly improved on the left after steroid injection and effusion tap.  -IVF discontinued as above  - Nephrology following, treating with Lasix, albumin  -4/7 edema with some improvement. albumin /lasix per nephro   -elevate/compression   -improve nutrition Weight reviewed and is stable  20. Low MG and potassium  -4/8 mg and K+ repletion today  -4/9 addition K+ was ordered  4/11 K+ stable 3.6, MG 2.0    Mobility Assessment  Calvin Schultz was seen for the purpose of a mobility assessment for a powered wheelchair. I have reviewed and agree with the detailed PT evaluation. Calvin Schultz suffers from chronic weakness, joint pain  related to DM, CKD, Knee OA, A fib. Due to joint pain, decreased endurance and obesity he is unable to utilize a cane, walker, manual wheelchair, or scooter. The patient is appropriate for a customized power wheelchair.  With a power wheelchair  Calvin Schultz can move independently at a household level and on a limited basis in the community. The chair will also allow the patient to perform ADL's more easily. The patient is competent to operate the recommended chair on his own and is motivated to utilize the chair on a daily basis.    LOS: 37 days A FACE TO FACE EVALUATION WAS PERFORMED  Calvin Schultz 08/16/2023, 9:36 AM

## 2023-08-16 NOTE — Progress Notes (Signed)
 [facility] Progress Note   Subjective:  Cr stable -  so far another good day today.   Urology following-  plans for IP procedure Tuesday  -  reasonable UOP  Objective Vitals:   08/15/23 1440 08/15/23 2028 08/16/23 0314 08/16/23 0441  BP: (!) 101/57 107/67 (!) 133/59   Pulse: 100 86 94   Resp: 14 15 16    Temp: 98.3 F (36.8 C)  98 F (36.7 C)   TempSrc:   Oral   SpO2: 100% 100% 100%   Weight:    99 kg  Height:       Physical Exam General: sitting in chair Heart: RRR Lungs: clear Abdomen: soft Extremities:  edema -->  improved overall  GU:  foley draining turbid urine  Additional Objective Labs: Basic Metabolic Panel: Recent Labs  Lab 08/12/23 0727 08/12/23 1628 08/14/23 0611 08/15/23 1300 08/16/23 0949  NA 130*   < > 131* 131* 133*  K 3.3*   < > 3.6 3.4* 3.4*  CL 101   < > 107 105 107  CO2 13*   < > 13* 14* 13*  GLUCOSE 98   < > 86 108* 88  BUN 77*   < > 73* 70* 71*  CREATININE 5.32*   < > 5.49* 5.85* 5.85*  CALCIUM 7.7*   < > 8.1* 7.7* 8.0*  PHOS 7.3*  --   --   --  7.2*   < > = values in this interval not displayed.   Liver Function Tests: Recent Labs  Lab 08/16/23 0949  ALBUMIN 2.0*    No results for input(s): "LIPASE", "AMYLASE" in the last 168 hours. CBC: Recent Labs  Lab 08/11/23 0505 08/12/23 0727 08/13/23 0539 08/14/23 0611 08/15/23 1300  WBC 52.1* 40.4* 27.8* 26.8* 24.8*  HGB 9.4* 9.9* 8.5* 8.3* 8.6*  HCT 29.6* 30.8* 26.9* 26.1* 27.5*  MCV 83.4 82.4 83.3 82.6 84.1  PLT 289 326 300 301 289   Blood Culture    Component Value Date/Time   SDES URINE, CATHETERIZED 08/04/2023 1408   SPECREQUEST  08/04/2023 1408    NONE Performed at [facility], [address], [place], [place] [address]    CULT >=100,000 COLONIES/mL YEAST (A) 08/04/2023 1408   REPTSTATUS 08/05/2023 FINAL 08/04/2023 1408    Cardiac Enzymes: No results for input(s): "CKTOTAL", "CKMB", "CKMBINDEX", "TROPONINI" in the last 168  hours. CBG: Recent Labs  Lab 08/14/23 1200 08/14/23 2144 08/15/23 0640 08/15/23 2135 08/16/23 0711  GLUCAP 121* 146* 96 138* 89   Iron Studies: No results for input(s): "IRON", "TIBC", "TRANSFERRIN", "FERRITIN" in the last 72 hours. @lablastinr3 @ Studies/Results: No results found.   Medications:  ceFEPime (MAXIPIME) IV 2 g (08/15/23 1525)      Chlorhexidine Gluconate Cloth  6 each Topical BID   darbepoetin (ARANESP) injection - NON-DIALYSIS  100 mcg Subcutaneous Q Fri-1800   diclofenac Sodium  2 g Topical QID   fidaxomicin  200 mg Oral BID   Gerhardt's butt cream   Topical BID   iron polysaccharides  150 mg Oral Daily   multivitamin  1 tablet Oral QHS   Muscle Rub   Topical TID   psyllium  1 packet Oral BID   sodium bicarbonate  1,300 mg Oral TID   tamsulosin  0.8 mg Oral QPC supper   traMADol  50 mg Oral BID   traZODone  75 mg Oral QHS    Assessment/Plan:73yo M A fib, DM, A fib on eliquis, nephrolithiasis s/p recent  PCN for staghorn calculus currently admitted to CIR for debility and nephrology is reconsulted for AKI on CKD.  **AKI on CKD 4:   Baseline kidney function 05/2023 Cr 4.1 - 5.3 f/b CKA.  After CRRT stopped his creatinine improved to nadir of 3.2 on 3/19 but gradually trended up 3/27 3.6 > 3/31 4.2 > 4/1 4.45 . Had staghorn calculus on L s/p PCN. Found to have obstructing stone on R s/p OR with ureteral stent 4/2.  Cr trending up further 5.14 > 5.8  - .  Remains nonoliguric with no uremic symptoms; no RRT needed at this time but discussed need may arise this hosp.   -  CT scan recently  to rule out any other obstructive insults-  possibly    - appreciate urology input-  for definitive procedure on 4/15- hoping that will get CKD back to baseline-  no absolute indications for HD   -  remove and replace Foley done on 4/10  Will check labs again on Tuesday-  just get them every once an a while-  do not need daily -  again hoping after definitive  procedure Tuesday  crt will improve  **Anemia, normocytic:  Transfuse as needed for Hb < 7.  Having acute blood loss through foley but H/H stable.  Eliquis has been held.  On aranesp.   **Metabolic acidosis:  worsening in context of AKI; in light of worsening overload d/cd bicarb gtt and switched to oral.   **A fib:  eliquis on hold for acute bleeding  **Orthostatic hypotension:  has been on midodrine for the last few weeks, BPs look ok currently.   **Debility:  on CIR    Hula Tasso A Shamyah Stantz  08/16/2023, 11:29 AM  [employer]

## 2023-08-17 ENCOUNTER — Other Ambulatory Visit: Payer: Self-pay | Admitting: Urology

## 2023-08-17 DIAGNOSIS — E440 Moderate protein-calorie malnutrition: Secondary | ICD-10-CM | POA: Insufficient documentation

## 2023-08-17 LAB — GLUCOSE, CAPILLARY: Glucose-Capillary: 90 mg/dL (ref 70–99)

## 2023-08-17 MED ORDER — SODIUM CHLORIDE 0.9 % IV SOLN
2.0000 g | INTRAVENOUS | Status: AC
  Administered 2023-08-18: 2 g via INTRAVENOUS
  Filled 2023-08-17: qty 12.5

## 2023-08-17 MED ORDER — SODIUM CHLORIDE 0.9 % IV SOLN: 2.0000 g | INTRAVENOUS | Status: DC

## 2023-08-17 MED ORDER — FLUCONAZOLE 200 MG PO TABS
600.0000 mg | ORAL_TABLET | Freq: Once | ORAL | Status: AC
  Administered 2023-08-17: 600 mg via ORAL
  Filled 2023-08-17: qty 3

## 2023-08-17 MED ORDER — FLUCONAZOLE 150 MG PO TABS: 300.0000 mg | ORAL_TABLET | Freq: Once | ORAL | Status: DC

## 2023-08-17 MED ORDER — FLUCONAZOLE 150 MG PO TABS
300.0000 mg | ORAL_TABLET | Freq: Once | ORAL | Status: AC
  Administered 2023-08-18: 300 mg via ORAL
  Filled 2023-08-17: qty 2

## 2023-08-17 MED ORDER — FLUCONAZOLE 200 MG PO TABS
600.0000 mg | ORAL_TABLET | Freq: Every day | ORAL | Status: DC
  Filled 2023-08-17: qty 3

## 2023-08-17 NOTE — Progress Notes (Signed)
 Occupational Therapy Weekly Progress Note  Patient Details  Name: Calvin Schultz MRN: [id number] Date of Birth: [birth year]  Beginning of progress report period: July 11, 2023 End of progress report period: July 18, 2023  {[facility] IP REHAB OT TIME CALCULATIONS:[id number]}   Patient has met 1 of 3 short term goals.  ***  Patient continues to demonstrate the following deficits: muscle weakness and Chronic pain, decreased cardiorespiratoy endurance, and decreased sitting balance, decreased postural control, and decreased balance strategies and therefore will continue to benefit from skilled OT intervention to enhance overall performance with BADL and Reduce care partner burden.  Patient progressing toward long term goals..  Continue plan of care.  OT Short Term Goals Week 5:  OT Short Term Goal 1 (Week 5): Pt will consistently demonstrate lateral scoot transfer at Min A with use of SB when transferring from surface to surface. OT Short Term Goal 1 - Progress (Week 5): Not met OT Short Term Goal 2 (Week 5): Pt will complete toileting with Mod A while utilizing drop arm BSC. OT Short Term Goal 2 - Progress (Week 5): Not met OT Short Term Goal 3 (Week 5): Pt will complete bed mobility while transitioning from supine to seated EOB with Mod A in order to participate in self care tasks and OOB activities. OT Short Term Goal 3 - Progress (Week 5): Met Week 6:  OT Short Term Goal 1 (Week 6): Pt will consistently demonstrate lateral scoot transfer at Min A with use of SB when transferring from surface to surface. OT Short Term Goal 2 (Week 6): Pt will complete toileting with Mod A while utilizing drop arm BSC.  Skilled Therapeutic Interventions/Progress Updates:    Patient agreeable to participate in OT session. Reports *** pain level.   Patient participated in skilled OT session focusing on ***. Therapist facilitated/assessed/developed/educated/integrated/elicited *** in order to  improve/facilitate/promote    Therapy Documentation Precautions:  Precautions Precautions: Fall Precaution/Restrictions Comments: Foley, bilateral knee pain and decreased ROM Restrictions Weight Bearing Restrictions Per Provider Order: No   Therapy/Group: Individual Therapy  Carollee Circle, OTR/L,CBIS  Supplemental OT - [facility] and [facility] Secure Chat Preferred   08/17/2023, 9:50 PM

## 2023-08-17 NOTE — Progress Notes (Signed)
 Nutrition Follow-up  DOCUMENTATION CODES:   Non-severe (moderate) malnutrition in context of acute illness/injury  INTERVENTION:   Continue meals from home brought in by wife. Continue Magic cup TID with meals, each supplement provides 290 kcal and 9 grams of protein. Continue HS snack daily. Continue Rena-vite daily.  NUTRITION DIAGNOSIS:   Moderate Malnutrition related to acute illness (septic shock, C. diff colitis, AKI on CKD) as evidenced by mild fat depletion, mild muscle depletion.  Ongoing   GOAL:   Patient will meet greater than or equal to 90% of their needs  Progressing  MONITOR:   PO intake, Weight trends, Supplement acceptance  REASON FOR ASSESSMENT:   Consult Poor PO  ASSESSMENT:   73 yo male admitted with functional deficits and debility r/t septic shock, C difficile colitis, AKI superimposed on CKD stage IV. PMH includes DM, HTN, arthritis, HLD, CKD, nephrostomy tube placement 06/03/23.  Patient sitting up in chair. Wife also in room. Patient has been eating much better since last week. Wife continues to bring in 2-3 meals and snacks for patient to eat. He is consuming 100% of the meals she brings in. He also eats a magic cup with all meals. He is receiving a snack at bedtime.   Plans for second stage of urology procedure to be done at [facility] tomorrow. Plans for d/c ~4/8.  Labs and medications reviewed.  Patient now meets criteria for moderate malnutrition with mild depletion of muscle and subcutaneous fat mass.  Nutrition Focused Physical Exam:  Flowsheet Row Most Recent Value  Orbital Region Mild depletion  Upper Arm Region Mild depletion  Thoracic and Lumbar Region Mild depletion  Buccal Region Mild depletion  Temple Region Mild depletion  Clavicle Bone Region Mild depletion  Clavicle and Acromion Bone Region Mild depletion  Scapular Bone Region Unable to assess  Dorsal Hand Mild depletion  Patellar Region Mild depletion  Anterior Thigh Region  Mild depletion  Posterior Calf Region Mild depletion  Edema (RD Assessment) Mild  [RLE]  Hair Reviewed  Eyes Reviewed  Mouth Reviewed  Skin Reviewed  Nails Reviewed        Diet Order:   Diet Order             Diet NPO time specified  Diet effective midnight           Diet renal with fluid restriction Fluid restriction: 1200 mL Fluid; Room service appropriate? Yes; Fluid consistency: Thin  Diet effective now                   EDUCATION NEEDS:   Not appropriate for education at this time  Skin:  Skin Assessment: Skin Integrity Issues: Skin Integrity Issues:: Other (Comment) Stage II: - Incisions: - Other: skin tear L elbow, MASD to perineum  Last BM:  4/14 type 6  Height:   Ht Readings from Last 1 Encounters:  08/05/23 5\' 9"  (1.753 m)    Weight:   Wt Readings from Last 1 Encounters:  08/16/23 99 kg    Ideal Body Weight:  72.7 kg  BMI:  Body mass index is 32.23 kg/m.  Estimated Nutritional Needs:   Kcal:  2200-2400  Protein:  110-120 g  Fluid:  >/= 2.2 L   Gabriel Rainwater RD, LDN, CNSC Contact via secure chat. If unavailable, use group chat "RD Inpatient."

## 2023-08-17 NOTE — Progress Notes (Signed)
 PROGRESS NOTE   Subjective/Complaints: No new complaints or concerns this morning.  Reports bowel movements continue to be less loose and less frequent.  Urology procedure scheduled for tomorrow.  Peripheral edema improving.  ROS: Patient denies fever, chills, headache, shortness of breath, chest pain, nausea, vomiting, diarrhea + Diarrhea - improved + poor appetite -improved     Objective:   No results found.      Recent Labs    08/15/23 1300  WBC 24.8*  HGB 8.6*  HCT 27.5*  PLT 289     Recent Labs    08/15/23 1300 08/16/23 0949  NA 131* 133*  K 3.4* 3.4*  CL 105 107  CO2 14* 13*  GLUCOSE 108* 88  BUN 70* 71*  CREATININE 5.85* 5.85*  CALCIUM 7.7* 8.0*      Intake/Output Summary (Last 24 hours) at 08/17/2023 1708 Last data filed at 08/17/2023 1400 Gross per 24 hour  Intake 360 ml  Output 350 ml  Net 10 ml         Physical Exam: Vital Signs Blood pressure (!) 139/102, pulse 94, temperature (!) 97.5 F (36.4 C), temperature source Oral, resp. rate 16, height 5\' 9"  (1.753 m), weight 99 kg, SpO2 100%.  Constitutional: No distress . Vital signs reviewed.  Appears comfortable lying in bed. HEENT: NCAT, EOMI, oral membranes moist Neck: supple Cardiovascular: RRR without murmur. No JVD    Respiratory/Chest: CTA Bilaterally without wheezes or rales. Normal effort    GI/Abdomen: BS +,  tender, non-distended, tender to palp Ext: no clubbing, cyanosis, 1+ LE edema and UE edema Psych: pleasant and cooperative  GU: Foley draining urine-amber urine in the bag    Skin: No evidence of breakdown, no evidence of rash Neurologic: Cranial nerves II through XII grossly intact, motor strength is moving all 4 extremities to gravity and resistance    MSK: Bilateral knee effusions ongoing. Less pain with ROM.  Left shoulder tight in ER/IR/F/E  stable 4/14     Assessment/Plan: 1. Functional deficits  which require 3+ hours per day of interdisciplinary therapy in a comprehensive inpatient rehab setting. Physiatrist is providing close team supervision and 24 hour management of active medical problems listed below. Physiatrist and rehab team continue to assess barriers to discharge/monitor patient progress toward functional and medical goals  Care Tool:  Bathing  Bathing activity did not occur:  (patient completed a simulated task at bed LOF) Body parts bathed by patient: Right arm, Left arm, Chest, Abdomen, Front perineal area, Right upper leg, Left upper leg, Face   Body parts bathed by helper: Buttocks, Left lower leg, Right lower leg     Bathing assist Assist Level: Maximal Assistance - Patient 24 - 49%     Upper Body Dressing/Undressing Upper body dressing   What is the patient wearing?: Pull over shirt    Upper body assist Assist Level: Minimal Assistance - Patient > 75%    Lower Body Dressing/Undressing Lower body dressing      What is the patient wearing?: Pants, Incontinence brief     Lower body assist Assist for lower body dressing: Total Assistance - Patient < 25%     Toileting Toileting  Toileting assist Assist for toileting: 2 Helpers     Transfers Chair/bed transfer  Transfers assist  Chair/bed transfer activity did not occur: Safety/medical concerns (not safe to get up)  Chair/bed transfer assist level: 2 Helpers     Locomotion Ambulation   Ambulation assist   Ambulation activity did not occur: Safety/medical concerns  Assist level: Dependent - Patient 0% Assistive device: Other (comment) (sara plus) Max distance: 18 steps   Walk 10 feet activity   Assist  Walk 10 feet activity did not occur: Safety/medical concerns        Walk 50 feet activity   Assist Walk 50 feet with 2 turns activity did not occur: Safety/medical concerns         Walk 150 feet activity   Assist Walk 150 feet activity did not occur: Safety/medical  concerns         Walk 10 feet on uneven surface  activity   Assist Walk 10 feet on uneven surfaces activity did not occur: Safety/medical concerns         Wheelchair     Assist Is the patient using a wheelchair?: Yes Type of Wheelchair: Power    Wheelchair assist level: Supervision/Verbal cueing Max wheelchair distance: 300+    Wheelchair 50 feet with 2 turns activity    Assist        Assist Level: Supervision/Verbal cueing   Wheelchair 150 feet activity     Assist      Assist Level: Supervision/Verbal cueing   Blood pressure (!) 139/102, pulse 94, temperature (!) 97.5 F (36.4 C), temperature source Oral, resp. rate 16, height 5\' 9"  (1.753 m), weight 99 kg, SpO2 100%.   Medical Problem List and Plan: 1. Functional deficits secondary to debility related to septic shock/C. difficile colitis/AKI superimposed on CKD stage IV             -patient may shower with tubes/drains covered             -ELOS/Goals:  3/28, min assist PT/OT             -Continue CIR therapies including PT, OT   -Team meeting scheduled for Tuesday  -family/ team meeting  Tuesday at 1 PM.  -Daily therapy for now  -Family meeting completed yesterday  -Will call insurance peer to peer-called and scheduled 2 time periods for call back  -Exp DC 4/18 currently    2.  Antithrombotics: -DVT/anticoagulation:  Pharmaceutical: Eliquis             -antiplatelet therapy: N/A   3. Pain Management: tramadol prn, Zanaflex 4 mg twice daily as needed   Biliateral OA of knees  -ordered bilateral neoprine knee sleeves --try today  -voltaren gel tid to knees  -have scheduled tramadol 50mg  at 0700 and 1200 daily. Continue prn as well  Mild adhesive capsulitis left shoulder---  sports cream. Can use voltaren gel also. Aggressive ROM with therapy and while in bed--continue  -Will check right knee x-ray, left knee x-ray on 3/8 with advanced OA.  Will consult orthopedics for cortisone  injections  -3-28: Orthopedics aspirated and injected left knee, with improvement.  Ongoing pain with right knee.  3/31 Knee fluid calcium pyrophosphate cyrystals, pseudogout?, cultures with no growth  -4/2 hydrocodone PRN started yesterday for flank pain  4/3 reconsult orthopedics, will come up and talk to him today about his right knee.  I had spoken with orthopedics on 4/1 are considering oral steroids due to crystals, pseudogout?  But held  off due to more acute medical issues  4/4 right knee is feeling better continue to monitor  4/5 continues to be improved although has not had therapy today  4/14 reports knee pain i remains improved    4. Mood/Behavior/Sleep: Trazodone 50 mg nightly as needed.  Provide emotional support             -antipsychotic agents: N/A   5. Neuropsych/cognition: This patient is capable of making decisions on his own behalf.   6. Skin/Wound Care: Routine skin checks   7. Fluids/Electrolytes/Nutrition:    -3/14 po intake remains poor, albumin low, weights falling  -will ask RD for assistance. Have spoken to patient about intake  -?IV albumin (see below)  3/18 discussed with dietitian, patient recorded is eating 0% of his meals.  Appears that wife bringing in 3 meals a day and he is eating about 75%, appears to have fairly adequate intake.  3/27 eating frequent outside foods-doesn't like hospital food as much  4/7 encouraged pt to eat any food he feels like. More important to just get calories in at this point  4/11 appetite is improving  8.  Oliguric AKI superimposed on CKD stage IV.  Baseline creatinine 4.9.  CRRT discontinued 2/25.  No current plan for long-term dialysis.  Follow-up renal services              - Per discussion with nephrology Dr. America Bake, keep Foley catheter in to allow accurate I's and O's through Monday; then, can remove and do DC Foley trial while continuing to document strict outputs             - Continue daily assessments of renal  function per nephrology   3/8- per renal, Cr leveling out- and con't Foley- will remove Monday  -nephrology following, Cr remains in 4's.   3/14 renal function with some improvement today     -suspect lower extremity edema is related to renal function/low albumin   Appreciate nephro input   -3/17 BUN/creatinine slightly improved 34/3.31, appreciate nephrology assistance  3/19 BUN/CR stable at 34/3.24, nephrology signing off, ok to check BMET twice a week  3/23 encouraging po this weekend. happy that he's eating food from home  3/24 BUN/CR overall stable at 40/3.26  -3/31 BUN/Cr a little higher today, IVF 47ml/hr  -4/1 Cr a little higher again, will contact nephrology  -4/3 nephrology note reviewed, IV Lasix discontinued and he was given some Lasix today, changed oral bicarb by nephrology, can give IV as needed  4/4 patient has a lot of edema, nephrology reducing Lasix today and holding IVF creatinine higher at 5.14  4/5 reviewed nephrology note, patient getting albumin and Lasix today  4/7 labs reviewed, similar to yesterday, ongoing mgt per nephro  4/8 Cr stable for past few days, CT ordered by nephrology to r/o other obstructive issue  4/9 discussed CT scan results with urology by phone, no interventions recommended at this time. Cr stable. Will await formal note for recommendations.   4/10 nephrology has ordered removal/replace foley, may need RRT-appears urology recommended to keep current Foley in place  4/14 reviewed nephrology note, hopefully Cr will improve after procedure tomorrow closer to his baseline 9.  C. difficile colitis.  Completed course of oral vancomycin 3/7.  DC'd IV Flagyl 3/3. Enteric precautions   3/14 stools formed this morning   3/17 discussed with ID pharmacy, patient having more frequent bowel movements.  Monitor today and if continues consider Dificid treatment  3/18 diarrhea appears  to be improving, continue to monitor for now  3/19 Frequent Bms improved,  continue to follow  3/21 having more frequent bowel movements again today, continue to monitor and if frequent diarrhea persists this weekend consider treatment with Dificid  3/23- mushy/lqiuid stool again overnight    -no dificid at this point. Discuss with GI first on Monday    -added fiber to regimen yesterday, will increase to bid today  3/24 mushy/ liquid BMs, about 2 a day yesterday, more other days- will discuss GI  3/25 GI recommended repeat C. difficile screening. Discussed with Arzella Laurence, hold off on repeat screening test, he only had about 2 bowel movements a day but if continues to have frequent BMs consider retreatment with oral vancomycin for 10 days  3/26 pt reports Bms more firm, continue to monitor   3/28 patient reports BMs have been more solid, continue to monitor.  If worsening diarrhea occurs consider retreatment with vancomycin oral for 10 days  3-29: Diarrhea this a.m., no bowel movements this afternoon recorded  3-30: No BM since yesterday.  3-31 diarrhea continues to be improved overall  4-3 LBM yesterday, reports diarrhea continues to be improved  -4/4 patient with increased diarrhea, discussed with hospitalist and ID.  Restarted oral vancomycin 125 mg 4 times a day for 10 days  -4/5 continue vancomycin oral, hospitalist following appreciate assistance  -4/6 discussed with ID today, will change to Dificid  -4/7 WBC's sl improved to 53k today. Continue fidaxomicin, fiber,etc   -appreciate ID f/u  4/8 WBC a little better today 52.1, continue to monitor. Still having frequent Bms  4/9 WBC improved to 40.4, continue to monitor. Diarrhea a little better  4/10 WBC decreased 27.8 Much improved, continue current regimen  4/14 diarrhea improved 10.  Status post left nephrostomy tube d/t staghorn calculi. Has Foley Nephrostomy tube placed 1/29 per IR with plan for ureteric stent placement 3/21              - Careful monitoring of nephrostomy output  -3/14 flomax has been  added to improve emptying  -3/21 patient scheduled today for cystoscopy with left retrograde pyelogram, left ureteroscopy laser lithotripsy and left ureteral stent placement.     3/22- procedure appears successful thus far.    -urine clear, patient comfortable   -foley to come out on Monday per urology  3/25 Foley was removed patient continues to have urinary retention requiring IC.  Increase Flomax to 0.8 mg  3/26 intermittent blood noted with IC, if continues will restart foley  3/27 Urine no longer blood tinged, continue current regimen, consider restart foley if reoccurs  3/27 continues to require catheterization but no longer blood-tinged Addendum 3/28 foley started after traumatic cath, possible false passage, continue foley for now, f/u with urology outpatient 4/1 foley with hematuria, denies known trauma to this, will ask nursing to check bladder scan. U/A culture ordered.  Called his urologist to discuss 4/2 CT abdomen/pelvis-bilateral obstructive uropathy with collecting system hematuria, large calculus in right ureter and pelvic inlet, new left hydronephrosis, new anasarca, liver nodules raising possibility of cirrhosis.  Discussed patient with urology - surgical procedure today  Addendum discussed with urology, will hold eliquis for now due to bleeding, check EKG- A flutter -4/3 cystoscopy, right retrograde pyelogram, right ureteral stent insertion yesterday by Dr. Willye Harvey, further procedure scheduled on 4/15 with Dr. Derrick Fling.  Discussed with [facility] urology.  Do not think traumatic cath last week caused recurrent bleeding his kidney, his wife was  concerned about this.  Patient was having nonbloody urine for several days on the Foley.  Will continue Foley and not attempt intermittent cath.  Patient suspected to have false passage. 4/4 urine appears less dark today, urology considering TXA tomorrow if bleeding is not resolved, Eliquis on hold 4/5 appreciate urology  assistance, bleeding does appear to be improving, only blood-tinged urine in Foley tubing.  Consider restart Eliquis when 4/6 Discussed with urology,  bleeding improving, hold off on TXA, continue to hold eliquis for today due to high risk of bleeding 4/7- urine appears to be clearing although hgb down to 8.7 today  -continue serial labs  -don' think TXA is indicated right now  -would keep eliquis on hold 4/8 Continue to hold eliques, could consider heparin trail- discuss with hospitalist- CT scan results before considering 4/9 Will check EKG to see if still having afib/flutter, consider heparin infusion, will hold off today as urine looks a little pink/blood tinged 4/10 Dark urine today, Pt was seen by urology, continue to monitor 4/11 Urine less dark today, continue to monitor  11.  Chronic atrial fibrillation with episodes of bradycardia.  Followed by cardiology services.  Continue Eliquis.  Avoid AV nodal blocking agents -4/11 cardiology consulted appreciate assistance, rate control.  Continue to hold anticoagulation for now.  Cardiology may consider left atrial appendage occlusion device after discharge.   12.  Diabetes mellitus.  Hemoglobin A1c 6.2.    -tightly controlled. Dc SSI and change cbg checks to qam only   CBG (last 3)  Recent Labs    08/15/23 2135 08/16/23 0711 08/17/23 0612  GLUCAP 138* 89 90       3-30: Recently increased.  Somewhat expected with intra-articular steroid injection.  Continue to trend.  4/5-7 controlled, continue to monitor   controlled, continue to monitor   13.  Hypotension.  continue midodrine to 5mg  TID PRN orthostatic hypotension       08/17/2023    1:53 PM 08/17/2023    5:44 AM 08/16/2023    8:00 PM  Vitals with BMI  Systolic 139 124 161  Diastolic 102 69 68  Pulse 94 98 76    14.  Chronic anemia.  continue Niferex daily/Aranesp.  Monitor for any bleeding episodes   3/8- Hb 7.1 today- will order transfusion- transfusion of 1 unit pRBCs.    3/14 hgb up to 9.1  -3/17 hemoglobin down to 8.4, continue to monitor  -3/19 HGB 8.3 today, stable overall, continue to monitor    -3/27 HGB 7.7- a little lower likely due to GU procedure/hematuria, continue to monitor  3/31 HGB 7.6 today, transfuse 7 or less  4/7 hgb down to 8.7. hematuria better--continue to monitor serially   16. Leukocytosis  3/9- Pt's WBC is 21.7- up from 16k- is afebrile- due to his recent C diff, that just finished correct dosing of ABX for, called ID for guidance on treatment- don't want to cause more Cdiff- ID consulted ---no new orders  3/10 WBC's down to 17k  --have been hovering above and below 20 for awhile. No new clinical symptoms. Nephrostomy drainage clear, yellow  3/14 wbc's down to 13k.--recheck Monday   3/17 WBC is down to 12.4  3/19 WBC 13.4 today, continue to monitor  3/24 WBC higher today 18.7, Discuss C diff treatment with GI a above 3/31 WBC still elevated 19.0, monitor for signs of infection 4/3 WBC still elevated to 23.8, potentially due to his urological issues and procedure.  Hospitalist also following appreciate  assistance  4/4 WBC higher today, oral vancomycin started as above.  Discussed with ID Lalla Pill and hospitalist.  Patient had Pseudomonas resulting in prior urine culture.  Recommended cefepime for approximately 10 days depending on WBC.  Called pharmacy for cefepime dosing.  ID recommended replacing Foley due to yeast on last culture, discussed with urology who indicates this was already done so hold off on changing again.  4/5 continue current antibiotic regimen, hospitalist team following.  WBC higher today-discussed with hospitalist yesterday, often see higher WBCs with C. difficile.  Continue monitor trend daily.  Patient remains afebrile and vital stable  4/7 WBC's 53k, sl improvement   -continue fidaxomicin   -appreciate ID help   -serial labs  4/10 WBC improved to 27  Recheck tomorrow  17. Decreased mood reported by  therapy  -Consider SSRI  -3/20 patient reports his mood is okay, denies depression this morning.  continue to monitor  18. Insomnia   continue trazodone  19. LE edema  -Significantly improved on the left after steroid injection and effusion tap.  -IVF discontinued as above  - Nephrology following, treating with Lasix, albumin  -4/7 edema with some improvement. albumin /lasix per nephro   -elevate/compression   -improve nutrition Weight reviewed and is stable  20. Low MG and potassium  -4/8 mg and K+ repletion today  -4/9 addition K+ was ordered  4/11 K+ stable 3.6, MG 2.0  Recheck tomorrow    Mobility Assessment  Jedi Catalfamo was seen for the purpose of a mobility assessment for a powered wheelchair. I have reviewed and agree with the detailed PT evaluation. Ambrosio Junker Oakley suffers from chronic weakness, joint pain  related to DM, CKD, Knee OA, A fib. Due to joint pain, decreased endurance and obesity he is unable to utilize a cane, walker, manual wheelchair, or scooter. The patient is appropriate for a customized power wheelchair.  With a power wheelchair  Zackrey Wardle can move independently at a household level and on a limited basis in the community. The chair will also allow the patient to perform ADL's more easily. The patient is competent to operate the recommended chair on his own and is motivated to utilize the chair on a daily basis.    LOS: 38 days A FACE TO FACE EVALUATION WAS PERFORMED  Lylia Sand 08/17/2023, 5:08 PM

## 2023-08-17 NOTE — Progress Notes (Signed)
 Patient ID: Calvin Schultz, male   DOB: October 06, 1950, 73 y.o.   MRN: [id number] S: No new complaints.  Unfortunately, his renal function was not drawn this am.   O:BP 124/69 (BP Location: Left Arm)   Pulse 98   Temp 97.6 F (36.4 C)   Resp 18   Ht 5\' 9"  (1.753 m)   Wt 99 kg   SpO2 100%   BMI 32.23 kg/m   Intake/Output Summary (Last 24 hours) at 08/17/2023 1343 Last data filed at 08/16/2023 1620 Gross per 24 hour  Intake --  Output 650 ml  Net -650 ml   Intake/Output: I/O last 3 completed shifts: In: -  Out: 950 [Urine:950]  Intake/Output this shift:  No intake/output data recorded. Weight change:  Gen: NAD CVS: RRR Resp:CTA Abd: +BS, soft, NT/ND Ext: trace pretibial edema  Recent Labs  Lab 08/11/23 0505 08/12/23 0727 08/12/23 1628 08/13/23 0539 08/14/23 0611 08/15/23 1300 08/16/23 0949  NA 131* 130* 133* 132* 131* 131* 133*  K 3.1* 3.3* 3.6 3.6 3.6 3.4* 3.4*  CL 104 101 103 103 107 105 107  CO2 12* 13* 17* 15* 13* 14* 13*  GLUCOSE 92 98 116* 101* 86 108* 88  BUN 79* 77* 75* 75* 73* 70* 71*  CREATININE 5.32* 5.32* 5.61* 5.55* 5.49* 5.85* 5.85*  ALBUMIN  --   --   --   --   --   --  2.0*  CALCIUM 7.9* 7.7* 8.0* 7.7* 8.1* 7.7* 8.0*  PHOS  --  7.3*  --   --   --   --  7.2*   Liver Function Tests: Recent Labs  Lab 08/16/23 0949  ALBUMIN 2.0*   No results for input(s): "LIPASE", "AMYLASE" in the last 168 hours. No results for input(s): "AMMONIA" in the last 168 hours. CBC: Recent Labs  Lab 08/11/23 0505 08/12/23 0727 08/13/23 0539 08/14/23 0611 08/15/23 1300  WBC 52.1* 40.4* 27.8* 26.8* 24.8*  HGB 9.4* 9.9* 8.5* 8.3* 8.6*  HCT 29.6* 30.8* 26.9* 26.1* 27.5*  MCV 83.4 82.4 83.3 82.6 84.1  PLT 289 326 300 301 289   Cardiac Enzymes: No results for input(s): "CKTOTAL", "CKMB", "CKMBINDEX", "TROPONINI" in the last 168 hours. CBG: Recent Labs  Lab 08/14/23 2144 08/15/23 0640 08/15/23 2135 08/16/23 0711 08/17/23 0612  GLUCAP 146* 96 138* 89 90    Iron  Studies: No results for input(s): "IRON", "TIBC", "TRANSFERRIN", "FERRITIN" in the last 72 hours. Studies/Results: No results found.  Chlorhexidine Gluconate Cloth  6 each Topical BID   darbepoetin (ARANESP) injection - NON-DIALYSIS  100 mcg Subcutaneous Q Fri-1800   diclofenac Sodium  2 g Topical QID   fidaxomicin  200 mg Oral BID   Gerhardt's butt cream   Topical BID   iron polysaccharides  150 mg Oral Daily   multivitamin  1 tablet Oral QHS   Muscle Rub   Topical TID   psyllium  1 packet Oral BID   sodium bicarbonate  1,300 mg Oral TID   tamsulosin  0.8 mg Oral QPC supper   traMADol  50 mg Oral BID   traZODone  75 mg Oral QHS    BMET    Component Value Date/Time   NA 133 (L) 08/16/2023 0949   NA 139 06/25/2021 1539   K 3.4 (L) 08/16/2023 0949   CL 107 08/16/2023 0949   CO2 13 (L) 08/16/2023 0949   GLUCOSE 88 08/16/2023 0949   BUN 71 (H) 08/16/2023 0949   BUN 22 06/25/2021 1539  CREATININE 5.85 (H) 08/16/2023 0949   CALCIUM 8.0 (L) 08/16/2023 0949   GFRNONAA 10 (L) 08/16/2023 0949   CBC    Component Value Date/Time   WBC 24.8 (H) 08/15/2023 1300   RBC 3.27 (L) 08/15/2023 1300   HGB 8.6 (L) 08/15/2023 1300   HGB 13.2 06/25/2021 1539   HCT 27.5 (L) 08/15/2023 1300   HCT 41.2 06/25/2021 1539   PLT 289 08/15/2023 1300   PLT 367 06/25/2021 1539   MCV 84.1 08/15/2023 1300   MCV 84 06/25/2021 1539   MCH 26.3 08/15/2023 1300   MCHC 31.3 08/15/2023 1300   RDW 16.8 (H) 08/15/2023 1300   RDW 12.6 06/25/2021 1539   LYMPHSABS 0.9 07/17/2023 0856   LYMPHSABS 2.4 06/25/2021 1539   MONOABS 1.0 07/17/2023 0856   EOSABS 0.3 07/17/2023 0856   EOSABS 0.2 06/25/2021 1539   BASOSABS 0.1 07/17/2023 0856   BASOSABS 0.1 06/25/2021 1539    Assessment/Plan:73yo M A fib, DM, A fib on eliquis, nephrolithiasis s/p recent PCN for staghorn calculus currently admitted to CIR for debility and nephrology is reconsulted for AKI on CKD.   **AKI on CKD 4:   Baseline kidney function 05/2023  Cr 4.1 - 5.3 f/b CKA.  After CRRT stopped his creatinine improved to nadir of 3.2 on 3/19 but gradually trended up 3/27 3.6 > 3/31 4.2 > 4/1 4.45 . Had staghorn calculus on L s/p PCN. Found to have obstructing stone on R s/p OR with ureteral stent 4/2.  Cr trending up further 5.14 > 5.85 but had been stable for 2 days. Will need to recheck tomorrow and hold off on HD for now.  - .  Remains nonoliguric with no uremic symptoms; no RRT needed at this time but discussed need may arise this hosp.            -  CT scan recently  to rule out any other obstructive insults-  possibly                          - appreciate urology input-  for IR nephrostomy tube change on 4/15- hoping that will get CKD back to baseline-  no absolute indications for HD                         -  remove and replace Foley done on 4/10   Will check labs again on Tuesday-  just get them every once an a while-  do not need daily -  again hoping after definitive  procedure Tuesday crt will improve   **Anemia, normocytic:  Transfuse as needed for Hb < 7.  Having acute blood loss through foley but H/H stable.  Eliquis has been held.  On aranesp.    **Metabolic acidosis:  worsening in context of AKI; in light of worsening overload d/cd bicarb gtt and switched to oral.    **A fib:  eliquis on hold for acute bleeding   **Orthostatic hypotension:  has been on midodrine for the last few weeks, BPs look ok currently.    **Debility:  on CIR  [employer], MD [employer] [phone]

## 2023-08-17 NOTE — Progress Notes (Signed)
 Patient ID: Calvin Schultz, male   DOB: 25-Aug-1950, 73 y.o.   MRN: [id number]  Met with pt and wife who report he will have the procedure tomorrow but here at [facility] and not at [facility] at 2;00 pm. Have heard from the insurance and see he has medical issues, so no denial. Will let pt wife and MD know this. Work toward discharge 4/18.

## 2023-08-17 NOTE — Progress Notes (Signed)
 Physical Therapy Session Note  Patient Details  Name: Calvin Schultz MRN: [id number] Date of Birth: Nov 14, 1950  Today's Date: 08/17/2023 PT Individual Time: 4742-5956 PT Individual Time Calculation (min): 34 min   Short Term Goals: Week 5:  PT Short Term Goal 1 (Week 5): STG=LTG 2/2 ELOS  Skilled Therapeutic Interventions/Progress Updates:      Pt seated in power WC upon arrival. Pt agreeable to therapy. Pt reports B shoulder pain, R UE> L UE. Notified nurse. Provided rest breaks and repositioning.   Discussed slide board transfer without chuck pad for improved adequate pressure relief and improved ease/less steps with slide board transfer to other surfaces such as BSC.   For time conservation, therapist positioned power WC nex to bed for slide board transfer WC to bed to L.   Pt and wife attempted downhill slide board transfer with wife providing assist, however therapist noted pt not putting in much effort with wife attempting to assist.   Therpaist encouraged pt to stand in front of pt to guard, but allow pt to do as much as possible. Therapist had extensive conversation with pt encouraging pt to participate to improve pt overall independence/strength and reduce overall caregiver burden. Pt then performed slide board transfer with increased time and CGA to stabilize the board, pt then required min A in last 25% of transfer, education and demonstration provided to pt wife on handling technique for head/hip ratio for improved leverage for improved body mechanics and reduced burden of care. Pt verbalized understanding. Will continue to practice.   Pt performed sit to supine with mod A for B LE management with cues for sequencing 2/2 pt R UE pain and fatigue.   Pt scheduled for surgical procedure 4/15.   Discussed power WC. Nu motion plans to deliver power WC and slide board to pt house Wednesday afternoon. Pt wife aware and has friend planned to be there to receive each.   Pt supine in  bed with all needs within reach and bed alarm on.   Therapy Documentation Precautions:  Precautions Precautions: Fall Precaution/Restrictions Comments: Foley, bilateral knee pain and decreased ROM Restrictions Weight Bearing Restrictions Per Provider Order: No  Therapy/Group: Individual Therapy  [facility] Jenney Modest, [place], DPT  08/17/2023, 3:35 PM

## 2023-08-17 NOTE — Progress Notes (Signed)
 Pt chart reviewed. Diflucan ordered today and tomorrow to cover for prior positive urine cx for yeast. Cefepime ordered for tomorrow based on prior urine cx. Plan for cystoscopy, bilateral ureteroscopy, laser lithotripsy, stent exchange, foley exchange tomorrow.

## 2023-08-18 ENCOUNTER — Inpatient Hospital Stay (HOSPITAL_COMMUNITY): Payer: Self-pay

## 2023-08-18 ENCOUNTER — Encounter (HOSPITAL_COMMUNITY)
Admission: AD | Disposition: A | Discharge status: Home Health | Payer: Self-pay | Source: Intra-hospital | Attending: Physical Medicine & Rehabilitation

## 2023-08-18 ENCOUNTER — Inpatient Hospital Stay (HOSPITAL_COMMUNITY)

## 2023-08-18 ENCOUNTER — Encounter (HOSPITAL_COMMUNITY): Payer: Self-pay | Admitting: Physical Medicine & Rehabilitation

## 2023-08-18 ENCOUNTER — Inpatient Hospital Stay (HOSPITAL_COMMUNITY): Admission: RE | Admit: 2023-08-18 | Source: Home / Self Care | Admitting: Urology

## 2023-08-18 DIAGNOSIS — N200 Calculus of kidney: Secondary | ICD-10-CM

## 2023-08-18 DIAGNOSIS — F418 Other specified anxiety disorders: Secondary | ICD-10-CM

## 2023-08-18 DIAGNOSIS — I10 Essential (primary) hypertension: Secondary | ICD-10-CM

## 2023-08-18 HISTORY — PX: CYSTOSCOPY/URETEROSCOPY/HOLMIUM LASER/STENT PLACEMENT: SHX6546

## 2023-08-18 LAB — RENAL FUNCTION PANEL
Albumin: 1.7 g/dL — ABNORMAL LOW (ref 3.5–5.0)
Anion gap: 12 (ref 5–15)
BUN: 68 mg/dL — ABNORMAL HIGH (ref 8–23)
CO2: 12 mmol/L — ABNORMAL LOW (ref 22–32)
Calcium: 8 mg/dL — ABNORMAL LOW (ref 8.9–10.3)
Chloride: 109 mmol/L (ref 98–111)
Creatinine, Ser: 6.02 mg/dL — ABNORMAL HIGH (ref 0.61–1.24)
GFR, Estimated: 9 mL/min — ABNORMAL LOW (ref >60)
Glucose, Bld: 118 mg/dL — ABNORMAL HIGH (ref 70–99)
Phosphorus: 6.9 mg/dL — ABNORMAL HIGH (ref 2.5–4.6)
Potassium: 2.9 mmol/L — ABNORMAL LOW (ref 3.5–5.1)
Sodium: 133 mmol/L — ABNORMAL LOW (ref 135–145)

## 2023-08-18 LAB — GLUCOSE, CAPILLARY
Glucose-Capillary: 106 mg/dL — ABNORMAL HIGH (ref 70–99)
Glucose-Capillary: 86 mg/dL (ref 70–99)
Glucose-Capillary: 104 mg/dL — ABNORMAL HIGH (ref 70–99)

## 2023-08-18 LAB — CBC
HCT: 25.5 % — ABNORMAL LOW (ref 39.0–52.0)
Hemoglobin: 7.9 g/dL — ABNORMAL LOW (ref 13.0–17.0)
MCH: 26.2 pg (ref 26.0–34.0)
MCHC: 31 g/dL (ref 30.0–36.0)
MCV: 84.7 fL (ref 80.0–100.0)
Platelets: 261 10*3/uL (ref 150–400)
RBC: 3.01 MIL/uL — ABNORMAL LOW (ref 4.22–5.81)
RDW: 17.4 % — ABNORMAL HIGH (ref 11.5–15.5)
WBC: 17.7 10*3/uL — ABNORMAL HIGH (ref 4.0–10.5)
nRBC: 0 % (ref 0.0–0.2)

## 2023-08-18 LAB — MAGNESIUM: Magnesium: 1.8 mg/dL (ref 1.7–2.4)

## 2023-08-18 SURGERY — CYSTOSCOPY/URETEROSCOPY/HOLMIUM LASER/STENT PLACEMENT
Anesthesia: General | Site: Ureter | Laterality: Bilateral

## 2023-08-18 MED ORDER — IOHEXOL 300 MG/ML  SOLN
INTRAMUSCULAR | Status: DC | PRN
  Administered 2023-08-18: 10 mL via URETHRAL

## 2023-08-18 MED ORDER — PHENYLEPHRINE 80 MCG/ML (10ML) SYRINGE FOR IV PUSH (FOR BLOOD PRESSURE SUPPORT)
PREFILLED_SYRINGE | INTRAVENOUS | Status: AC
  Filled 2023-08-18: qty 10

## 2023-08-18 MED ORDER — STERILE WATER FOR IRRIGATION IR SOLN
Status: DC | PRN
  Administered 2023-08-18: 1000 mL

## 2023-08-18 MED ORDER — FENTANYL CITRATE (PF) 250 MCG/5ML IJ SOLN
INTRAMUSCULAR | Status: DC | PRN
  Administered 2023-08-18 (×3): 25 ug via INTRAVENOUS

## 2023-08-18 MED ORDER — ORAL CARE MOUTH RINSE: 15.0000 mL | Freq: Once | OROMUCOSAL | Status: AC

## 2023-08-18 MED ORDER — DEXAMETHASONE SODIUM PHOSPHATE 10 MG/ML IJ SOLN
INTRAMUSCULAR | Status: AC
  Filled 2023-08-18: qty 1

## 2023-08-18 MED ORDER — ONDANSETRON HCL 4 MG/2ML IJ SOLN
INTRAMUSCULAR | Status: AC
  Filled 2023-08-18: qty 2

## 2023-08-18 MED ORDER — PROPOFOL 10 MG/ML IV BOLUS
INTRAVENOUS | Status: DC | PRN
  Administered 2023-08-18: 140 mg via INTRAVENOUS
  Administered 2023-08-18: 20 mg via INTRAVENOUS

## 2023-08-18 MED ORDER — CHLORHEXIDINE GLUCONATE 0.12 % MT SOLN
15.0000 mL | Freq: Once | OROMUCOSAL | Status: AC
  Administered 2023-08-18: 15 mL via OROMUCOSAL
  Filled 2023-08-18: qty 15

## 2023-08-18 MED ORDER — PHENYLEPHRINE HCL-NACL 20-0.9 MG/250ML-% IV SOLN
INTRAVENOUS | Status: DC | PRN
  Administered 2023-08-18 (×2): 160 ug via INTRAVENOUS
  Administered 2023-08-18: 120 ug via INTRAVENOUS
  Administered 2023-08-18 (×3): 160 ug via INTRAVENOUS
  Administered 2023-08-18: 30 ug/min via INTRAVENOUS
  Administered 2023-08-18 (×2): 160 ug via INTRAVENOUS

## 2023-08-18 MED ORDER — DEXAMETHASONE SODIUM PHOSPHATE 10 MG/ML IJ SOLN
INTRAMUSCULAR | Status: DC | PRN
  Administered 2023-08-18: 5 mg via INTRAVENOUS

## 2023-08-18 MED ORDER — FENTANYL CITRATE (PF) 250 MCG/5ML IJ SOLN
INTRAMUSCULAR | Status: AC
  Filled 2023-08-18: qty 5

## 2023-08-18 MED ORDER — LIDOCAINE 2% (20 MG/ML) 5 ML SYRINGE
INTRAMUSCULAR | Status: AC
Start: 2023-08-18 — End: ?
  Filled 2023-08-18: qty 5

## 2023-08-18 MED ORDER — SODIUM CHLORIDE 0.9 % IV SOLN: INTRAVENOUS | Status: AC

## 2023-08-18 MED ORDER — ONDANSETRON HCL 4 MG/2ML IJ SOLN
INTRAMUSCULAR | Status: DC | PRN
  Administered 2023-08-18: 4 mg via INTRAVENOUS

## 2023-08-18 MED ORDER — LIDOCAINE 2% (20 MG/ML) 5 ML SYRINGE
INTRAMUSCULAR | Status: DC | PRN
  Administered 2023-08-18: 60 mg via INTRAVENOUS

## 2023-08-18 MED ORDER — VASOPRESSIN 20 UNIT/ML IV SOLN
INTRAVENOUS | Status: DC | PRN
  Administered 2023-08-18 (×5): 1 [IU] via INTRAVENOUS

## 2023-08-18 MED ORDER — 0.9 % SODIUM CHLORIDE (POUR BTL) OPTIME
TOPICAL | Status: DC | PRN
  Administered 2023-08-18: 1000 mL

## 2023-08-18 MED ORDER — ALBUMIN HUMAN 5 % IV SOLN
INTRAVENOUS | Status: DC | PRN
Start: 2023-08-18 — End: 2023-08-18

## 2023-08-18 MED ORDER — SODIUM CHLORIDE 0.9 % IR SOLN
Status: DC | PRN
  Administered 2023-08-18: 3000 mL

## 2023-08-18 MED ORDER — POTASSIUM CHLORIDE CRYS ER 20 MEQ PO TBCR
40.0000 meq | EXTENDED_RELEASE_TABLET | Freq: Once | ORAL | Status: AC
  Administered 2023-08-18: 40 meq via ORAL
  Filled 2023-08-18: qty 2

## 2023-08-18 MED ORDER — LACTATED RINGERS IV SOLN: INTRAVENOUS | Status: DC

## 2023-08-18 MED ORDER — VASOPRESSIN 20 UNIT/ML IV SOLN
INTRAVENOUS | Status: AC
  Filled 2023-08-18: qty 1

## 2023-08-18 SURGICAL SUPPLY — 26 items
BAG URINE DRAIN 2000ML AR STRL (UROLOGICAL SUPPLIES) ×1 IMPLANT
BAG URO CATCHER STRL LF (MISCELLANEOUS) ×1 IMPLANT
BASKET ZERO TIP NITINOL 2.4FR (BASKET) IMPLANT
CATH FOLEY 2WAY SLVR 5CC 18FR (CATHETERS) IMPLANT
CATH URET 5FR 28IN OPEN ENDED (CATHETERS) IMPLANT
CATH URETHERAL OPEN END 7FR (CATHETERS) IMPLANT
CATH URTH STD 16FR FL 2W DRN (CATHETERS) IMPLANT
GLOVE BIO SURGEON STRL SZ7.5 (GLOVE) ×1 IMPLANT
GLOVE SURG SS PI 7.0 STRL IVOR (GLOVE) IMPLANT
GOWN STRL REUS W/ TWL LRG LVL3 (GOWN DISPOSABLE) ×1 IMPLANT
GOWN STRL REUS W/ TWL XL LVL3 (GOWN DISPOSABLE) ×1 IMPLANT
GUIDEWIRE ANG ZIPWIRE 038X150 (WIRE) IMPLANT
GUIDEWIRE STR DUAL SENSOR (WIRE) IMPLANT
HOLDER FOLEY CATH W/STRAP (MISCELLANEOUS) IMPLANT
KIT TURNOVER KIT B (KITS) ×1 IMPLANT
LASER FIB FLEXIVA PULSE ID 365 (Laser) IMPLANT
MANIFOLD NEPTUNE II (INSTRUMENTS) ×1 IMPLANT
NS IRRIG 1000ML POUR BTL (IV SOLUTION) ×1 IMPLANT
PACK CYSTO (CUSTOM PROCEDURE TRAY) ×1 IMPLANT
SHEATH NAVIGATOR HD 11/13X36 (SHEATH) IMPLANT
SOL .9 NS 3000ML IRR UROMATIC (IV SOLUTION) ×2 IMPLANT
STENT CONTOUR 6FRX24X.038 (STENTS) IMPLANT
STENT CONTOUR 6FRX26X.038 (STENTS) IMPLANT
STENT URET 6FRX26 CONTOUR (STENTS) IMPLANT
TUBE CONNECTING 12X1/4 (SUCTIONS) IMPLANT
WATER STERILE IRR 1000ML POUR (IV SOLUTION) IMPLANT

## 2023-08-18 NOTE — Transfer of Care (Signed)
 Immediate Anesthesia Transfer of Care Note  Patient: Calvin Schultz  Procedure(s) Performed: CYSTOSCOPY/URETEROSCOPY/HOLMIUM LASER/STENT PLACEMENT (Bilateral: Ureter)  Patient Location: PACU  Anesthesia Type:General  Level of Consciousness: awake  Airway & Oxygen Therapy: Patient Spontanous Breathing and Patient connected to face mask oxygen  Post-op Assessment: Report given to RN and Post -op Vital signs reviewed and stable  Post vital signs: Reviewed and stable  Last Vitals:  Vitals Value Taken Time  BP 135/80 08/18/23 1640  Temp    Pulse 90 08/18/23 1644  Resp 22 08/18/23 1644  SpO2 100 % 08/18/23 1644  Vitals shown include unfiled device data.  Last Pain:  Vitals:   08/18/23 1342  TempSrc:   PainSc: 0-No pain      Patients Stated Pain Goal: 1 (08/11/23 2120)  Complications: No notable events documented.

## 2023-08-18 NOTE — Progress Notes (Signed)
 Physical Therapy Session Note  Patient Details  Name: Calvin Schultz MRN: [id number] Date of Birth: 1950-12-17  Today's Date: 08/18/2023 PT Individual Time: 6213-0865 PT Individual Time Calculation (min): 29 min   Short Term Goals: Week 5:  PT Short Term Goal 1 (Week 5): STG=LTG 2/2 ELOS  Skilled Therapeutic Interventions/Progress Updates:      Pt supine in bed upon arrival. Pt agreeable to therapy. Pt reports pain in B Knees R LE> L LE 9/10--notified nursing, therapist donned heat pack to B LE at end of session.  pt reports increased weakness and fatigue overall today. Pt reports decreased pain in shoulder but wife reports tremors/shaking in hand when pt makes a fist and R UE generalized shaking/tremor with arm above head. Notified nurse, PA and MD via secure chat as well as in person during morning rounds.   Pt required significantly increased time today to performed supine to sit, and sit to supine with use of bed features and min A for management of trunk for supine to sit, and mod A for management of B LE for sit to supine--verbal cues provided for technique and sequencing and for initiation.    Pt supine in bed with all needs within reach and bed alarm on.    Therapy Documentation Precautions:  Precautions Precautions: Fall Precaution/Restrictions Comments: Foley, bilateral knee pain and decreased ROM Restrictions Weight Bearing Restrictions Per Provider Order: No  Therapy/Group: Individual Therapy  [facility] Jenney Modest, [place], DPT  08/18/2023, 7:50 AM

## 2023-08-18 NOTE — Progress Notes (Signed)
 PROGRESS NOTE   Subjective/Complaints: Pt was noted to have some R hand twitching this AM, resolved currently. Bms less frequent/less loose. Urology procedure today. K+ was low.   ROS: Patient denies fever, chills, headache, shortness of breath, chest pain, nausea, vomiting + Knee pain + Diarrhea - improved + poor appetite -improved     Objective:   No results found.      Recent Labs    08/15/23 1300 08/18/23 0529  WBC 24.8* 17.7*  HGB 8.6* 7.9*  HCT 27.5* 25.5*  PLT 289 261     Recent Labs    08/16/23 0949 08/18/23 0529  NA 133* 133*  K 3.4* 2.9*  CL 107 109  CO2 13* 12*  GLUCOSE 88 118*  BUN 71* 68*  CREATININE 5.85* 6.02*  CALCIUM 8.0* 8.0*      Intake/Output Summary (Last 24 hours) at 08/18/2023 0847 Last data filed at 08/17/2023 2027 Gross per 24 hour  Intake 240 ml  Output 550 ml  Net -310 ml         Physical Exam: Vital Signs Blood pressure 100/66, pulse 98, temperature 97.8 F (36.6 C), resp. rate 17, height 5\' 9"  (1.753 m), weight 99.2 kg, SpO2 99%.  Constitutional: No distress . Vital signs reviewed.  Sitting in bed HEENT: NCAT, EOMI, oral membranes moist Neck: supple Cardiovascular: RRR without murmur. No JVD    Respiratory/Chest: CTA Bilaterally without wheezes or rales. Normal effort    GI/Abdomen: BS +,  tender, non-distended, tender to palp Ext: no clubbing, cyanosis, 1+ LE edema and UE edema Psych: pleasant and cooperative  GU: Foley draining urine-amber urine in the bag    Skin: No evidence of breakdown, no evidence of rash Neurologic: Cranial nerves II through XII grossly intact, motor strength is moving all 4 extremities to gravity and resistance  No tremor/twiching noted at this time   MSK: Bilateral knee effusions ongoing. Less pain with ROM.  Left shoulder tight in ER/IR/F/E  stable 4/15     Assessment/Plan: 1. Functional deficits which require 3+ hours  per day of interdisciplinary therapy in a comprehensive inpatient rehab setting. Physiatrist is providing close team supervision and 24 hour management of active medical problems listed below. Physiatrist and rehab team continue to assess barriers to discharge/monitor patient progress toward functional and medical goals  Care Tool:  Bathing  Bathing activity did not occur:  (patient completed a simulated task at bed LOF) Body parts bathed by patient: Right arm, Left arm, Chest, Abdomen, Front perineal area, Right upper leg, Left upper leg, Face   Body parts bathed by helper: Buttocks, Left lower leg, Right lower leg     Bathing assist Assist Level: Maximal Assistance - Patient 24 - 49%     Upper Body Dressing/Undressing Upper body dressing   What is the patient wearing?: Pull over shirt    Upper body assist Assist Level: Minimal Assistance - Patient > 75%    Lower Body Dressing/Undressing Lower body dressing      What is the patient wearing?: Pants, Incontinence brief     Lower body assist Assist for lower body dressing: Total Assistance - Patient < 25%  Toileting Toileting    Toileting assist Assist for toileting: 2 Helpers     Transfers Chair/bed transfer  Transfers assist  Chair/bed transfer activity did not occur: Safety/medical concerns (not safe to get up)  Chair/bed transfer assist level: 2 Helpers     Locomotion Ambulation   Ambulation assist   Ambulation activity did not occur: Safety/medical concerns  Assist level: Dependent - Patient 0% Assistive device: Other (comment) (sara plus) Max distance: 18 steps   Walk 10 feet activity   Assist  Walk 10 feet activity did not occur: Safety/medical concerns        Walk 50 feet activity   Assist Walk 50 feet with 2 turns activity did not occur: Safety/medical concerns         Walk 150 feet activity   Assist Walk 150 feet activity did not occur: Safety/medical concerns          Walk 10 feet on uneven surface  activity   Assist Walk 10 feet on uneven surfaces activity did not occur: Safety/medical concerns         Wheelchair     Assist Is the patient using a wheelchair?: Yes Type of Wheelchair: Power    Wheelchair assist level: Supervision/Verbal cueing Max wheelchair distance: 300+    Wheelchair 50 feet with 2 turns activity    Assist        Assist Level: Supervision/Verbal cueing   Wheelchair 150 feet activity     Assist      Assist Level: Supervision/Verbal cueing   Blood pressure 100/66, pulse 98, temperature 97.8 F (36.6 C), resp. rate 17, height 5\' 9"  (1.753 m), weight 99.2 kg, SpO2 99%.   Medical Problem List and Plan: 1. Functional deficits secondary to debility related to septic shock/C. difficile colitis/AKI superimposed on CKD stage IV             -patient may shower with tubes/drains covered             -ELOS/Goals:  3/28, min assist PT/OT             -Continue CIR therapies including PT, OT   -Team meeting scheduled for Tuesday  -family/ team meeting  Tuesday at 1 PM.  -Daily therapy for now  -Family meeting completed yesterday  -Will call insurance peer to peer-called and scheduled 2 time periods for call back  -Exp DC 4/18 currently    2.  Antithrombotics: -DVT/anticoagulation:  Pharmaceutical: Eliquis             -antiplatelet therapy: N/A   3. Pain Management: tramadol prn, Zanaflex 4 mg twice daily as needed   Biliateral OA of knees  -ordered bilateral neoprine knee sleeves --try today  -voltaren gel tid to knees  -have scheduled tramadol 50mg  at 0700 and 1200 daily. Continue prn as well  Mild adhesive capsulitis left shoulder---  sports cream. Can use voltaren gel also. Aggressive ROM with therapy and while in bed--continue  -Will check right knee x-ray, left knee x-ray on 3/8 with advanced OA.  Will consult orthopedics for cortisone injections  -3-28: Orthopedics aspirated and injected left  knee, with improvement.  Ongoing pain with right knee.  3/31 Knee fluid calcium pyrophosphate cyrystals, pseudogout?, cultures with no growth  -4/2 hydrocodone PRN started yesterday for flank pain  4/3 reconsult orthopedics, will come up and talk to him today about his right knee.  I had spoken with orthopedics on 4/1 are considering oral steroids due to crystals, pseudogout?  But held  off due to more acute medical issues  4/4 right knee is feeling better continue to monitor  4/5 continues to be improved although has not had therapy today  4/15 Knee pain continues with activity, continue tramadol    4. Mood/Behavior/Sleep: Trazodone 50 mg nightly as needed.  Provide emotional support             -antipsychotic agents: N/A   5. Neuropsych/cognition: This patient is capable of making decisions on his own behalf.   6. Skin/Wound Care: Routine skin checks   7. Fluids/Electrolytes/Nutrition:    -3/14 po intake remains poor, albumin low, weights falling  -will ask RD for assistance. Have spoken to patient about intake  -?IV albumin (see below)  3/18 discussed with dietitian, patient recorded is eating 0% of his meals.  Appears that wife bringing in 3 meals a day and he is eating about 75%, appears to have fairly adequate intake.  3/27 eating frequent outside foods-doesn't like hospital food as much  4/7 encouraged pt to eat any food he feels like. More important to just get calories in at this point  4/11 appetite is improving  8.  Oliguric AKI superimposed on CKD stage IV.  Baseline creatinine 4.9.  CRRT discontinued 2/25.  No current plan for long-term dialysis.  Follow-up renal services              - Per discussion with nephrology Dr. America Bake, keep Foley catheter in to allow accurate I's and O's through Monday; then, can remove and do DC Foley trial while continuing to document strict outputs             - Continue daily assessments of renal function per nephrology   3/8- per renal, Cr  leveling out- and con't Foley- will remove Monday  -nephrology following, Cr remains in 4's.   3/14 renal function with some improvement today     -suspect lower extremity edema is related to renal function/low albumin   Appreciate nephro input   -3/17 BUN/creatinine slightly improved 34/3.31, appreciate nephrology assistance  3/19 BUN/CR stable at 34/3.24, nephrology signing off, ok to check BMET twice a week  3/23 encouraging po this weekend. happy that he's eating food from home  3/24 BUN/CR overall stable at 40/3.26  -3/31 BUN/Cr a little higher today, IVF 76ml/hr  -4/1 Cr a little higher again, will contact nephrology  -4/3 nephrology note reviewed, IV Lasix discontinued and he was given some Lasix today, changed oral bicarb by nephrology, can give IV as needed  4/4 patient has a lot of edema, nephrology reducing Lasix today and holding IVF creatinine higher at 5.14  4/5 reviewed nephrology note, patient getting albumin and Lasix today  4/7 labs reviewed, similar to yesterday, ongoing mgt per nephro  4/8 Cr stable for past few days, CT ordered by nephrology to r/o other obstructive issue  4/9 discussed CT scan results with urology by phone, no interventions recommended at this time. Cr stable. Will await formal note for recommendations.   4/10 nephrology has ordered removal/replace foley, may need RRT-appears urology recommended to keep current Foley in place  4/14-15 reviewed nephrology note, hopefully Cr will improve after procedure tomorrow closer to his baseline 9.  C. difficile colitis.  Completed course of oral vancomycin 3/7.  DC'd IV Flagyl 3/3. Enteric precautions   3/14 stools formed this morning   3/17 discussed with ID pharmacy, patient having more frequent bowel movements.  Monitor today and if continues consider Dificid treatment  3/18 diarrhea  appears to be improving, continue to monitor for now  3/19 Frequent Bms improved, continue to follow  3/21 having more frequent  bowel movements again today, continue to monitor and if frequent diarrhea persists this weekend consider treatment with Dificid  3/23- mushy/lqiuid stool again overnight    -no dificid at this point. Discuss with GI first on Monday    -added fiber to regimen yesterday, will increase to bid today  3/24 mushy/ liquid BMs, about 2 a day yesterday, more other days- will discuss GI  3/25 GI recommended repeat C. difficile screening. Discussed with Arzella Laurence, hold off on repeat screening test, he only had about 2 bowel movements a day but if continues to have frequent BMs consider retreatment with oral vancomycin for 10 days  3/26 pt reports Bms more firm, continue to monitor   3/28 patient reports BMs have been more solid, continue to monitor.  If worsening diarrhea occurs consider retreatment with vancomycin oral for 10 days  3-29: Diarrhea this a.m., no bowel movements this afternoon recorded  3-30: No BM since yesterday.  3-31 diarrhea continues to be improved overall  4-3 LBM yesterday, reports diarrhea continues to be improved  -4/4 patient with increased diarrhea, discussed with hospitalist and ID.  Restarted oral vancomycin 125 mg 4 times a day for 10 days  -4/5 continue vancomycin oral, hospitalist following appreciate assistance  -4/6 discussed with ID today, will change to Dificid  -4/7 WBC's sl improved to 53k today. Continue fidaxomicin, fiber,etc   -appreciate ID f/u  4/8 WBC a little better today 52.1, continue to monitor. Still having frequent Bms  4/9 WBC improved to 40.4, continue to monitor. Diarrhea a little better  4/10 WBC decreased 27.8 Much improved, continue current regimen  4/14 diarrhea improved  4/15 DC dificit today- completed 10 days 10.  Status post left nephrostomy tube d/t staghorn calculi. Has Foley Nephrostomy tube placed 1/29 per IR with plan for ureteric stent placement 3/21              - Careful monitoring of nephrostomy output  -3/14 flomax has been  added to improve emptying  -3/21 patient scheduled today for cystoscopy with left retrograde pyelogram, left ureteroscopy laser lithotripsy and left ureteral stent placement.     3/22- procedure appears successful thus far.    -urine clear, patient comfortable   -foley to come out on Monday per urology  3/25 Foley was removed patient continues to have urinary retention requiring IC.  Increase Flomax to 0.8 mg  3/26 intermittent blood noted with IC, if continues will restart foley  3/27 Urine no longer blood tinged, continue current regimen, consider restart foley if reoccurs  3/27 continues to require catheterization but no longer blood-tinged Addendum 3/28 foley started after traumatic cath, possible false passage, continue foley for now, f/u with urology outpatient 4/1 foley with hematuria, denies known trauma to this, will ask nursing to check bladder scan. U/A culture ordered.  Called his urologist to discuss 4/2 CT abdomen/pelvis-bilateral obstructive uropathy with collecting system hematuria, large calculus in right ureter and pelvic inlet, new left hydronephrosis, new anasarca, liver nodules raising possibility of cirrhosis.  Discussed patient with urology - surgical procedure today  Addendum discussed with urology, will hold eliquis for now due to bleeding, check EKG- A flutter -4/3 cystoscopy, right retrograde pyelogram, right ureteral stent insertion yesterday by Dr. Willye Harvey, further procedure scheduled on 4/15 with Dr. Derrick Fling.  Discussed with [facility] urology.  Do not think traumatic cath last  week caused recurrent bleeding his kidney, his wife was concerned about this.  Patient was having nonbloody urine for several days on the Foley.  Will continue Foley and not attempt intermittent cath.  Patient suspected to have false passage. 4/4 urine appears less dark today, urology considering TXA tomorrow if bleeding is not resolved, Eliquis on hold 4/5 appreciate urology  assistance, bleeding does appear to be improving, only blood-tinged urine in Foley tubing.  Consider restart Eliquis when 4/6 Discussed with urology,  bleeding improving, hold off on TXA, continue to hold eliquis for today due to high risk of bleeding 4/7- urine appears to be clearing although hgb down to 8.7 today  -continue serial labs  -don' think TXA is indicated right now  -would keep eliquis on hold 4/8 Continue to hold eliques, could consider heparin trail- discuss with hospitalist- CT scan results before considering 4/9 Will check EKG to see if still having afib/flutter, consider heparin infusion, will hold off today as urine looks a little pink/blood tinged 4/10 Dark urine today, Pt was seen by urology, continue to monitor 4/11 Urine less dark today, continue to monitor 4/15 Urology procedure today  11.  Chronic atrial fibrillation with episodes of bradycardia.  Followed by cardiology services.  Continue Eliquis.  Avoid AV nodal blocking agents -4/11 cardiology consulted appreciate assistance, rate control.  Continue to hold anticoagulation for now.  Cardiology may consider left atrial appendage occlusion device after discharge.   12.  Diabetes mellitus.  Hemoglobin A1c 6.2.    -tightly controlled. Dc SSI and change cbg checks to qam only   CBG (last 3)  Recent Labs    08/16/23 0711 08/17/23 0612 08/18/23 0615  GLUCAP 89 90 106*       3-30: Recently increased.  Somewhat expected with intra-articular steroid injection.  Continue to trend.  4/5-7 controlled, continue to monitor   controlled, continue to monitor   13.  Hypotension.  continue midodrine to 5mg  TID PRN orthostatic hypotension       08/18/2023    6:17 AM 08/18/2023    5:00 AM 08/17/2023    7:56 PM  Vitals with BMI  Weight  218 lbs 11 oz   BMI  32.28   Systolic 100  106  Diastolic 66  66  Pulse 98  94    14.  Chronic anemia.  continue Niferex daily/Aranesp.  Monitor for any bleeding episodes   3/8- Hb 7.1  today- will order transfusion- transfusion of 1 unit pRBCs.   3/14 hgb up to 9.1  -3/17 hemoglobin down to 8.4, continue to monitor  -3/19 HGB 8.3 today, stable overall, continue to monitor    -3/27 HGB 7.7- a little lower likely due to GU procedure/hematuria, continue to monitor  3/31 HGB 7.6 today, transfuse 7 or less  4/7 hgb down to 8.7. hematuria better--continue to monitor serially   4/15 overall stable at 7.9  16. Leukocytosis  3/9- Pt's WBC is 21.7- up from 16k- is afebrile- due to his recent C diff, that just finished correct dosing of ABX for, called ID for guidance on treatment- don't want to cause more Cdiff- ID consulted ---no new orders  3/10 WBC's down to 17k  --have been hovering above and below 20 for awhile. No new clinical symptoms. Nephrostomy drainage clear, yellow  3/14 wbc's down to 13k.--recheck Monday   3/17 WBC is down to 12.4  3/19 WBC 13.4 today, continue to monitor  3/24 WBC higher today 18.7, Discuss C diff treatment with  GI a above 3/31 WBC still elevated 19.0, monitor for signs of infection 4/3 WBC still elevated to 23.8, potentially due to his urological issues and procedure.  Hospitalist also following appreciate assistance  4/4 WBC higher today, oral vancomycin started as above.  Discussed with ID Lalla Pill and hospitalist.  Patient had Pseudomonas resulting in prior urine culture.  Recommended cefepime for approximately 10 days depending on WBC.  Called pharmacy for cefepime dosing.  ID recommended replacing Foley due to yeast on last culture, discussed with urology who indicates this was already done so hold off on changing again.  4/5 continue current antibiotic regimen, hospitalist team following.  WBC higher today-discussed with hospitalist yesterday, often see higher WBCs with C. difficile.  Continue monitor trend daily.  Patient remains afebrile and vital stable  4/7 WBC's 53k, sl improvement   -continue fidaxomicin   -appreciate ID  help   -serial labs  4/10 WBC improved to 27  4/15 down to 17.7 today  17. Decreased mood reported by therapy  -Consider SSRI  -3/20 patient reports his mood is okay, denies depression this morning.  continue to monitor  18. Insomnia   continue trazodone  19. LE edema  -Significantly improved on the left after steroid injection and effusion tap.  -IVF discontinued as above  - Nephrology following, treating with Lasix, albumin  -4/7 edema with some improvement. albumin /lasix per nephro   -elevate/compression   -improve nutrition Weight reviewed and is stable  20. Low MG and potassium  -4/8 mg and K+ repletion today  -4/9 addition K+ was ordered  4/11 K+ stable 3.6, MG 2.0  4/15 Replete - discussed dose with pharmacy. Twitching may be related to low K+    Mobility Assessment  Enrrique Mierzwa was seen for the purpose of a mobility assessment for a powered wheelchair. I have reviewed and agree with the detailed PT evaluation. Ambrosio Junker Gullion suffers from chronic weakness, joint pain  related to DM, CKD, Knee OA, A fib. Due to joint pain, decreased endurance and obesity he is unable to utilize a cane, walker, manual wheelchair, or scooter. The patient is appropriate for a customized power wheelchair.  With a power wheelchair  Eva Henriquez can move independently at a household level and on a limited basis in the community. The chair will also allow the patient to perform ADL's more easily. The patient is competent to operate the recommended chair on his own and is motivated to utilize the chair on a daily basis.    LOS: 39 days A FACE TO FACE EVALUATION WAS PERFORMED  Lylia Sand 08/18/2023, 8:47 AM

## 2023-08-18 NOTE — Anesthesia Preprocedure Evaluation (Signed)
 Anesthesia Evaluation  Patient identified by MRN, date of birth, ID band Patient awake    Reviewed: Allergy & Precautions, H&P , NPO status , Patient's Chart, lab work & pertinent test results  History of Anesthesia Complications Negative for: history of anesthetic complications  Airway Mallampati: II  TM Distance: >3 FB Neck ROM: full    Dental  (+) Teeth Intact, Dental Advisory Given   Pulmonary neg pulmonary ROS   breath sounds clear to auscultation       Cardiovascular hypertension, (-) angina (-) Past MI + dysrhythmias Atrial Fibrillation  Rhythm:regular Rate:Normal   1. Left ventricular ejection fraction, by estimation, is 60 to 65%. The  left ventricle has normal function. The left ventricle has no regional  wall motion abnormalities. The left ventricular internal cavity size was  moderately dilated. There is moderate   left ventricular hypertrophy. Left ventricular diastolic parameters were  normal.   2. Right ventricular systolic function is normal. The right ventricular  size is mildly enlarged.   3. Left atrial size was mildly dilated.   4. Right atrial size was mildly dilated.   5. The mitral valve is normal in structure. Mild mitral valve  regurgitation. No evidence of mitral stenosis.   6. The aortic valve is tricuspid. Aortic valve regurgitation is not  visualized. Aortic valve sclerosis is present, with no evidence of aortic  valve stenosis.   7. Aortic dilatation noted. There is borderline dilatation of the aortic  root, measuring 38 mm.   8. The inferior vena cava is normal in size with greater than 50%  respiratory variability, suggesting right atrial pressure of 3 mmHg.     Neuro/Psych negative neurological ROS     GI/Hepatic negative GI ROS, Neg liver ROS,,,  Endo/Other  diabetes, Poorly Controlled, Type 2  Lab Results      Component                Value               Date                       HGBA1C                   6.2 (H)             06/26/2023          \   Renal/GU Renal Insufficiency and CRFRenal diseasestones     Musculoskeletal  (+) Arthritis ,    Abdominal   Peds  Hematology  (+) Blood dyscrasia, anemia Lab Results      Component                Value               Date                      WBC                      17.7 (H)            08/18/2023                HGB                      7.9 (L)             08/18/2023  HCT                      25.5 (L)            08/18/2023                MCV                      84.7                08/18/2023                PLT                      261                 08/18/2023              Anesthesia Other Findings   Reproductive/Obstetrics                              Anesthesia Physical Anesthesia Plan  ASA: 3  Anesthesia Plan: General   Post-op Pain Management: Tylenol PO (pre-op)*   Induction: Intravenous  PONV Risk Score and Plan: 2 and Ondansetron, Dexamethasone and Treatment may vary due to age or medical condition  Airway Management Planned: LMA  Additional Equipment: None  Intra-op Plan:   Post-operative Plan: Extubation in OR  Informed Consent: I have reviewed the patients History and Physical, chart, labs and discussed the procedure including the risks, benefits and alternatives for the proposed anesthesia with the patient or authorized representative who has indicated his/her understanding and acceptance.     Dental advisory given  Plan Discussed with: CRNA and Surgeon  Anesthesia Plan Comments:         Anesthesia Quick Evaluation

## 2023-08-18 NOTE — Progress Notes (Signed)
 S: Terion is without specific complaint today.  Overall feeling well.  No fevers.  O: Vitals:   08/18/23 0617 08/18/23 1324  BP: 100/66 119/79  Pulse: 98 100  Resp: 17 18  Temp: 97.8 F (36.6 C) 97.6 F (36.4 C)  SpO2: 99% 98%   He is awake and alert in the preop area. CV - RRR Resp -regular effort and depth Abdomen soft and nontender GU-Foley catheter in place.  Urine clear. Ext-No calf pain or swelling  CBC    Component Value Date/Time   WBC 17.7 (H) 08/18/2023 0529   RBC 3.01 (L) 08/18/2023 0529   HGB 7.9 (L) 08/18/2023 0529   HGB 13.2 06/25/2021 1539   HCT 25.5 (L) 08/18/2023 0529   HCT 41.2 06/25/2021 1539   PLT 261 08/18/2023 0529   PLT 367 06/25/2021 1539   MCV 84.7 08/18/2023 0529   MCV 84 06/25/2021 1539   MCH 26.2 08/18/2023 0529   MCHC 31.0 08/18/2023 0529   RDW 17.4 (H) 08/18/2023 0529   RDW 12.6 06/25/2021 1539   LYMPHSABS 0.9 07/17/2023 0856   LYMPHSABS 2.4 06/25/2021 1539   MONOABS 1.0 07/17/2023 0856   EOSABS 0.3 07/17/2023 0856   EOSABS 0.2 06/25/2021 1539   BASOSABS 0.1 07/17/2023 0856   BASOSABS 0.1 06/25/2021 1539   Lab Results  Component Value Date   CREATININE 6.02 (H) 08/18/2023   CREATININE 5.85 (H) 08/16/2023   CREATININE 5.85 (H) 08/15/2023   A/P: Nomar has a 16 mm left renal stone status post prior left ureteroscopy, stent placement and nephrostomy tube removal.  He then passed a 6 mm right ureteral stone with hydro and was stented urgently.  Cultures have grown yeast and Pseudomonas.  He was loaded with fluconazole last night.  He will get another dose this evening.  He was treated with a treatment course of cefepime and has cefepime ordered preoperatively.  Went over again with the patient and his wife the nature, potential benefits, risks and alternatives to cystoscopy with bilateral ureteroscopy, laser lithotripsy stent exchange, Foley exchange, including side effects of the proposed treatment, the likelihood of the patient achieving the  goals of the procedure, and any potential problems that might occur during the procedure or recuperation.  Given the chronic stent and Foley as well as his frailty he is going to be at high risk for infectious complications but we have done our best to cover all his cultures.  The stones, stents and catheter need to be dealt with so he can continue to recover and focus on physical therapy.  All questions answered. Patient elects to proceed.

## 2023-08-18 NOTE — Anesthesia Procedure Notes (Addendum)
 Procedure Name: LMA Insertion Date/Time: 08/18/2023 2:58 PM  Performed by: Neomia Banner, RNPre-anesthesia Checklist: Patient identified, Emergency Drugs available, Suction available and Patient being monitored Patient Re-evaluated:Patient Re-evaluated prior to induction Oxygen Delivery Method: Circle System Utilized Preoxygenation: Pre-oxygenation with 100% oxygen Induction Type: IV induction Ventilation: Mask ventilation without difficulty LMA: LMA inserted LMA Size: 4.0 Number of attempts: 1 Airway Equipment and Method: Bite block Placement Confirmation: positive ETCO2 Tube secured with: Tape Dental Injury: Teeth and Oropharynx as per pre-operative assessment

## 2023-08-18 NOTE — Op Note (Signed)
 Preoperative diagnosis: Right ureteral stone, left renal stones Postoperative diagnosis: Same  Procedure: Cystoscopy with bilateral ureteroscopy laser lithotripsy and stent exchange  Surgeon: Mena Goes  Anesthesia: General  Indication for procedure: Calvin Schultz is a 73 year old male who passed a 16 mm left UPJ stone and underwent a nephrostomy tube.  He then underwent a complicated left ureteroscopy, stent placement and left nephrostomy tube removal planning to come back to clear the residual left stone in the interim he passed a right stent and was stented on the right.  He was brought for bilateral ureteroscopy today.  Findings: The urethra was unremarkable, prostate short and nonobstructive.  Bladder appeared normal without mucosal lesion.  Bilateral stents in place.  Right ureteroscopy revealed the stone in the mid ureter which was dusted and some of the fragments extracted dropped in the bladder.  Left ureteroscopy revealed a large stone at the left UPJ which was dusted.  Some stone in the midpole and was dusted in the lower pole stones were difficult to access and were left in place.  Description of procedure: After consent was obtained patient brought to the operating room.  After adequate anesthesia he was placed in lithotomy position and prepped and draped in the usual sterile fashion.  Timeout was performed to confirm the patient and procedure.  The cystoscope was passed per urethra and the bladder inspected.  It was flushed several times to clear.  Right ureteral stent was grasped and removed through the urethral meatus.  A sensor wire was advanced up through the wire and coiled in the upper calyx.  The stent was removed.  A semirigid scope was passed along the wire where the stone was located.  A 365 m laser fiber was passed where the stone was fragmented into 2 larger pieces.  Those were extracted and dropped in the bladder sequentially.  Repeat ureteroscopy up toward the UPJ revealed there to  be no other stones and no ureteral injury.  The scope was backed out.    The cystoscope was passed again in the bladder and the left ureteral stent grasped and removed through the meatus.  A sensor wire was advanced up the left stent.  I used a medium access sheath to get 2 wires in place.  I then went adjacent to the sensor wire leaving it as the safety.  A single channel ureteroscope was all that was available.  It was advanced up into the left kidney where the stone was located.  The 365 m laser fiber was advanced and the stone was dusted.  Some residual fragments were dusted.  The lower pole was a bit difficult to access it was a significant angle down and medial.  The stones here were rather large and given that they were nonobstructive I decided to leave these in place as we were at a good stopping point.  Therefore the collecting system was inspected again.  The access sheath was backed out on the ureteroscope and the renal pelvis ureter inspected on the way out and noted to be free of injury.  The wire was backloaded on the cystoscope and a 6 x 26 cm stent passed on the left.  The wire was backloaded on the cystoscope and a 6 x 26 cm stent was advanced on the right.  The scope was carefully backed out.  An 33 French Foley catheter was placed and seated at the bladder neck.  The strings were taped to the Foley.  He was awakened and taken to the cover  room in stable condition.  Complications: None  Blood loss: Minimal  Specimens: None  Drains: Bilateral 6 x 26 cm ureteral stents taped to Foley catheter  Disposition: Patient stable to PACU.

## 2023-08-18 NOTE — Progress Notes (Signed)
 Patient ID: Jobany Montellano, male   DOB: July 19, 1950, 73 y.o.   MRN: [id number] S: No new complaints O:BP 100/66 (BP Location: Left Arm)   Pulse 98   Temp 97.8 F (36.6 C)   Resp 17   Ht 5\' 9"  (1.753 m)   Wt 99.2 kg   SpO2 99%   BMI 32.30 kg/m   Intake/Output Summary (Last 24 hours) at 08/18/2023 1057 Last data filed at 08/17/2023 2027 Gross per 24 hour  Intake 240 ml  Output 550 ml  Net -310 ml   Intake/Output: I/O last 3 completed shifts: In: 360 [P.O.:360] Out: 550 [Urine:550]  Intake/Output this shift:  No intake/output data recorded. Weight change:  Gen: NAD CVS: RRR Resp: scattered rhonchi bilaterally Abd: +Bs, soft, NT/ND Ext: trace pretibial edema  Recent Labs  Lab 08/12/23 0727 08/12/23 1628 08/13/23 0539 08/14/23 0611 08/15/23 1300 08/16/23 0949 08/18/23 0529  NA 130* 133* 132* 131* 131* 133* 133*  K 3.3* 3.6 3.6 3.6 3.4* 3.4* 2.9*  CL 101 103 103 107 105 107 109  CO2 13* 17* 15* 13* 14* 13* 12*  GLUCOSE 98 116* 101* 86 108* 88 118*  BUN 77* 75* 75* 73* 70* 71* 68*  CREATININE 5.32* 5.61* 5.55* 5.49* 5.85* 5.85* 6.02*  ALBUMIN  --   --   --   --   --  2.0* 1.7*  CALCIUM 7.7* 8.0* 7.7* 8.1* 7.7* 8.0* 8.0*  PHOS 7.3*  --   --   --   --  7.2* 6.9*   Liver Function Tests: Recent Labs  Lab 08/16/23 0949 08/18/23 0529  ALBUMIN 2.0* 1.7*   No results for input(s): "LIPASE", "AMYLASE" in the last 168 hours. No results for input(s): "AMMONIA" in the last 168 hours. CBC: Recent Labs  Lab 08/12/23 0727 08/13/23 0539 08/14/23 0611 08/15/23 1300 08/18/23 0529  WBC 40.4* 27.8* 26.8* 24.8* 17.7*  HGB 9.9* 8.5* 8.3* 8.6* 7.9*  HCT 30.8* 26.9* 26.1* 27.5* 25.5*  MCV 82.4 83.3 82.6 84.1 84.7  PLT 326 300 301 289 261   Cardiac Enzymes: No results for input(s): "CKTOTAL", "CKMB", "CKMBINDEX", "TROPONINI" in the last 168 hours. CBG: Recent Labs  Lab 08/15/23 0640 08/15/23 2135 08/16/23 0711 08/17/23 0612 08/18/23 0615  GLUCAP 96 138* 89 90 106*     Iron Studies: No results for input(s): "IRON", "TIBC", "TRANSFERRIN", "FERRITIN" in the last 72 hours. Studies/Results: No results found.  Chlorhexidine Gluconate Cloth  6 each Topical BID   darbepoetin (ARANESP) injection - NON-DIALYSIS  100 mcg Subcutaneous Q Fri-1800   diclofenac Sodium  2 g Topical QID   fidaxomicin  200 mg Oral BID   fluconazole  300 mg Oral Once   Gerhardt's butt cream   Topical BID   iron polysaccharides  150 mg Oral Daily   multivitamin  1 tablet Oral QHS   Muscle Rub   Topical TID   psyllium  1 packet Oral BID   sodium bicarbonate  1,300 mg Oral TID   tamsulosin  0.8 mg Oral QPC supper   traMADol  50 mg Oral BID   traZODone  75 mg Oral QHS    BMET    Component Value Date/Time   NA 133 (L) 08/18/2023 0529   NA 139 06/25/2021 1539   K 2.9 (L) 08/18/2023 0529   CL 109 08/18/2023 0529   CO2 12 (L) 08/18/2023 0529   GLUCOSE 118 (H) 08/18/2023 0529   BUN 68 (H) 08/18/2023 0529   BUN 22 06/25/2021 1539  CREATININE 6.02 (H) 08/18/2023 0529   CALCIUM 8.0 (L) 08/18/2023 0529   GFRNONAA 9 (L) 08/18/2023 0529   CBC    Component Value Date/Time   WBC 17.7 (H) 08/18/2023 0529   RBC 3.01 (L) 08/18/2023 0529   HGB 7.9 (L) 08/18/2023 0529   HGB 13.2 06/25/2021 1539   HCT 25.5 (L) 08/18/2023 0529   HCT 41.2 06/25/2021 1539   PLT 261 08/18/2023 0529   PLT 367 06/25/2021 1539   MCV 84.7 08/18/2023 0529   MCV 84 06/25/2021 1539   MCH 26.2 08/18/2023 0529   MCHC 31.0 08/18/2023 0529   RDW 17.4 (H) 08/18/2023 0529   RDW 12.6 06/25/2021 1539   LYMPHSABS 0.9 07/17/2023 0856   LYMPHSABS 2.4 06/25/2021 1539   MONOABS 1.0 07/17/2023 0856   EOSABS 0.3 07/17/2023 0856   EOSABS 0.2 06/25/2021 1539   BASOSABS 0.1 07/17/2023 0856   BASOSABS 0.1 06/25/2021 1539    Assessment/Plan:72yo M A fib, DM, A fib on eliquis, nephrolithiasis s/p recent PCN for staghorn calculus currently admitted to CIR for debility and nephrology is reconsulted for AKI on CKD.    **AKI on CKD 4:   Baseline kidney function 05/2023 Cr 4.1 - 5.3 f/b CKA.  After CRRT stopped his creatinine improved to nadir of 3.2 on 3/19 but gradually trended up 3/27 3.6 > 3/31 4.2 > 4/1 4.45 . Had staghorn calculus on L s/p PCN. Found to have obstructing stone on R s/p OR with ureteral stent 4/2.  Cr trending up further 5.14 > 5.85 but had been stable for 2 days. Will need to recheck tomorrow and hold off on HD for now.  - .  Remains nonoliguric with no uremic symptoms; no RRT needed at this time but discussed need may arise this hospitalization.            -  CT scan recently  to rule out any other obstructive insults-  possibly                          - appreciate urology input-  for IR nephrostomy tube change on 4/15- hoping that will get CKD back to baseline-  no absolute indications for HD                         -  remove and replace Foley done on 4/10  -  will follow renal function following procedure.    **Anemia, normocytic:  Transfuse as needed for Hb < 7.  Having acute blood loss through foley but H/H stable.  Eliquis has been held.  On aranesp.    **Metabolic acidosis:  worsening in context of AKI; in light of worsening overload d/cd bicarb gtt and switched to oral.    **A fib:  eliquis on hold for acute bleeding   **Orthostatic hypotension:  has been on midodrine for the last few weeks, BPs look ok currently  **Hypokalemia - given supplements today and follow.   Benjamin Brands, MD [facility]

## 2023-08-18 NOTE — Progress Notes (Signed)
 Patient status post bilateral ureteroscopy with laser lithotripsy stent exchange and Foley catheter exchange.  He has 2 new stents taped to a new Foley catheter.  I would recommend leaving the catheter/stents for 5 days and pulling the catheter out on Monday morning, August 24, 2023 which will also remove the stents.  He has follow-up ultrasound in my office appointment on chart in 5 to 6 weeks.

## 2023-08-19 ENCOUNTER — Encounter (HOSPITAL_COMMUNITY): Payer: Self-pay | Admitting: Urology

## 2023-08-19 LAB — POCT I-STAT, CHEM 8
BUN: 68 mg/dL — ABNORMAL HIGH (ref 8–23)
Calcium, Ion: 1.12 mmol/L — ABNORMAL LOW (ref 1.15–1.40)
Chloride: 112 mmol/L — ABNORMAL HIGH (ref 98–111)
Creatinine, Ser: 6.9 mg/dL — ABNORMAL HIGH (ref 0.61–1.24)
Glucose, Bld: 87 mg/dL (ref 70–99)
HCT: 29 % — ABNORMAL LOW (ref 39.0–52.0)
Hemoglobin: 9.9 g/dL — ABNORMAL LOW (ref 13.0–17.0)
Potassium: 3.6 mmol/L (ref 3.5–5.1)
Sodium: 136 mmol/L (ref 135–145)
TCO2: 13 mmol/L — ABNORMAL LOW (ref 22–32)

## 2023-08-19 LAB — GLUCOSE, CAPILLARY
Glucose-Capillary: 107 mg/dL — ABNORMAL HIGH (ref 70–99)
Glucose-Capillary: 155 mg/dL — ABNORMAL HIGH (ref 70–99)

## 2023-08-19 LAB — RENAL FUNCTION PANEL
Albumin: 1.9 g/dL — ABNORMAL LOW (ref 3.5–5.0)
Anion gap: 7 (ref 5–15)
BUN: 65 mg/dL — ABNORMAL HIGH (ref 8–23)
CO2: 11 mmol/L — ABNORMAL LOW (ref 22–32)
Calcium: 7.7 mg/dL — ABNORMAL LOW (ref 8.9–10.3)
Chloride: 116 mmol/L — ABNORMAL HIGH (ref 98–111)
Creatinine, Ser: 5.78 mg/dL — ABNORMAL HIGH (ref 0.61–1.24)
GFR, Estimated: 10 mL/min — ABNORMAL LOW (ref >60)
Glucose, Bld: 125 mg/dL — ABNORMAL HIGH (ref 70–99)
Phosphorus: 7.3 mg/dL — ABNORMAL HIGH (ref 2.5–4.6)
Potassium: 3.7 mmol/L (ref 3.5–5.1)
Sodium: 134 mmol/L — ABNORMAL LOW (ref 135–145)

## 2023-08-19 NOTE — Patient Care Conference (Signed)
 Inpatient RehabilitationTeam Conference and Plan of Care Update Date: 08/19/2023   Time: 12:13 PM    Patient Name: Calvin Schultz      Medical Record Number: [id number]  Date of Birth: [birth year] Sex: Male         Room/Bed: 4M01C/4M01C-01 Payor Info: Payor: [employer] / Plan: [place] PREFERRED / Product Type: *No Product type* /    Admit Date/Time:  07/10/2023  2:55 PM  Primary Diagnosis:  Right ureteral calculus  Hospital Problems: Principal Problem:   Right ureteral calculus Active Problems:   Acute kidney injury superimposed on chronic kidney disease (HCC)   Chronic atrial fibrillation (HCC)   Normocytic anemia   Metabolic acidosis   Debility   Pressure injury of skin   Leukocytosis   History of Clostridium difficile colitis   Controlled diabetes mellitus type 2 with complications (HCC)   Acute cystitis with hematuria   C. difficile diarrhea   Depression with anxiety   Hypokalemia   Hypomagnesemia   Longstanding persistent atrial fibrillation (HCC)   Typical atrial flutter (HCC)   Acute blood loss anemia   Gross hematuria   Malnutrition of moderate degree    Expected Discharge Date: Expected Discharge Date: 08/21/23  Team Members Present: Physician leading conference: Dr. Lylia Sand Social Worker Present: Adrianna Albee, LCSW Nurse Present: Forrestine Ike, RN PT Present: Jenney Modest, PT OT Present: Henrene Locust, OT PPS Coordinator present : Jestine Moron, SLP     Current Status/Progress Goal Weekly Team Focus  Bowel/Bladder   Patient is incontinent of bowel. Has a Coude foley catheter   Patient will regain continence   Will assess q shift and PRN    Swallow/Nutrition/ Hydration               ADL's   Wife is assisting with all OT sessions related to self care, functional transfers, and bed mobility. Pt continues to require 2 person assist for SB t/f, total A for toileting and LB dressing at bed level. Set-up UB ADL.   Max A: toileting, Mod A Bathing,  LB dressing. Removed sit to stand, tub/shower, and dynamic standing balance goals.   endurance, activity tolerance, family education    Mobility   pt wife is assisting with all PT sessions related to bed mobility, slide board transfer, and WC navigation. Pt continues to require min-mod A for bed mobility, min A for downhill slide board transfer with significantly increased time. Pt is refusing   pt at goal level  D/C 4/18, follow up HHPT, power WC/slide board being delivered today    Communication                Safety/Cognition/ Behavioral Observations               Pain   Bilateral knee pain managed with PRN pain meds   Patient will be pain free   Will assess q shift and PRN    Skin   Patient has a stage II on coccyx   Promote wound healing  Will assess q shift and PRN      Discharge Planning:  Wife has been here and doing hands on care and transfers with slide board. Pt had procedure yesterday and hopefully has not back slid will see today. DME in place and [facility] arranged working toward DC Friday 4/18   Team Discussion: Patient post right ureteral calculi with C-diff and debility. Limited by knee pain, self limiting behavior.  Patient on target to meet rehab goals: Currently needs  total assist for toileting and + 2 for slide-board transfers to the [facility].  Pt continues to require 2 person assist for SB t/f, total A for toileting and LB dressing at bed level.   *See Care Plan and progress notes for long and short-term goals.   Revisions to Treatment Plan:  Urethroscopy 08/18/23 Lithotripsy 08/18/23  Teaching Needs: Safety, medications, transfers, toileting, skin care/wound care, etc.   Current Barriers to Discharge: Decreased caregiver support, Home enviroment access/layout, Incontinence, Wound care, and Behavior  Possible Resolutions to Barriers: Family education DME: power wheelchair, slide board Non emergent transport     Medical Summary Current Status:  debility, renal stones, AKI,             I attest that I was present, lead the team conference, and concur with the assessment and plan of the team.   Forrestine Ike B 08/19/2023, 2:44 PM

## 2023-08-19 NOTE — Progress Notes (Signed)
 PROGRESS NOTE   Subjective/Complaints: Pt having some abdominal discomfort when urinating, patient reports urology this morning told him this was expected after surgery.  ROS: Patient denies fever, chills, headache, shortness of breath, chest pain, nausea, vomiting + Knee pain- continued  + Diarrhea - improved + poor appetite -improved     Objective:   DG C-Arm 1-60 Min-No Report Result Date: 08/18/2023 Fluoroscopy was utilized by the requesting physician.  No radiographic interpretation.   DG C-Arm 1-60 Min-No Report Result Date: 08/18/2023 Fluoroscopy was utilized by the requesting physician.  No radiographic interpretation.        Recent Labs    08/18/23 0529 08/18/23 1351  WBC 17.7*  --   HGB 7.9* 9.9*  HCT 25.5* 29.0*  PLT 261  --      Recent Labs    08/18/23 0529 08/18/23 1351 08/19/23 0520  NA 133* 136 134*  K 2.9* 3.6 3.7  CL 109 112* 116*  CO2 12*  --  11*  GLUCOSE 118* 87 125*  BUN 68* 68* 65*  CREATININE 6.02* 6.90* 5.78*  CALCIUM 8.0*  --  7.7*      Intake/Output Summary (Last 24 hours) at 08/19/2023 1555 Last data filed at 08/19/2023 1402 Gross per 24 hour  Intake --  Output 906 ml  Net -906 ml         Physical Exam: Vital Signs Blood pressure 129/76, pulse 96, temperature 98 F (36.7 C), temperature source Oral, resp. rate 16, height 5\' 9"  (1.753 m), weight 98.4 kg, SpO2 100%.  Constitutional: No distress . Vital signs reviewed.  Sitting in bed HEENT: NCAT, EOMI, oral membranes moist Neck: supple Cardiovascular: RRR without murmur. No JVD    Respiratory/Chest: CTA Bilaterally without wheezes or rales. Normal effort    GI/Abdomen: BS +, , non-distended, non-tender Ext:  trace LE edema Psych: pleasant and cooperative  GU: Foley draining urine-slightly pink    Skin: No evidence of breakdown, no evidence of rash Neurologic: Cranial nerves II through XII grossly intact,  motor strength is moving all 4 extremities to gravity and resistance   MSK: Bilateral knee effusions ongoing.  stable exam 4/16     Assessment/Plan: 1. Functional deficits which require 3+ hours per day of interdisciplinary therapy in a comprehensive inpatient rehab setting. Physiatrist is providing close team supervision and 24 hour management of active medical problems listed below. Physiatrist and rehab team continue to assess barriers to discharge/monitor patient progress toward functional and medical goals  Care Tool:  Bathing  Bathing activity did not occur:  (patient completed a simulated task at bed LOF) Body parts bathed by patient: Right arm, Left arm, Chest, Abdomen, Front perineal area, Right upper leg, Left upper leg, Face   Body parts bathed by helper: Buttocks, Left lower leg, Right lower leg     Bathing assist Assist Level: Maximal Assistance - Patient 24 - 49%     Upper Body Dressing/Undressing Upper body dressing   What is the patient wearing?: Pull over shirt    Upper body assist Assist Level: Minimal Assistance - Patient > 75%    Lower Body Dressing/Undressing Lower body dressing      What  is the patient wearing?: Pants, Incontinence brief     Lower body assist Assist for lower body dressing: Total Assistance - Patient < 25%     Toileting Toileting    Toileting assist Assist for toileting: 2 Helpers     Transfers Chair/bed transfer  Transfers assist  Chair/bed transfer activity did not occur: Safety/medical concerns (not safe to get up)  Chair/bed transfer assist level: 2 Helpers     Locomotion Ambulation   Ambulation assist   Ambulation activity did not occur: Safety/medical concerns  Assist level: Dependent - Patient 0% Assistive device: Other (comment) (sara plus) Max distance: 18 steps   Walk 10 feet activity   Assist  Walk 10 feet activity did not occur: Safety/medical concerns        Walk 50 feet  activity   Assist Walk 50 feet with 2 turns activity did not occur: Safety/medical concerns         Walk 150 feet activity   Assist Walk 150 feet activity did not occur: Safety/medical concerns         Walk 10 feet on uneven surface  activity   Assist Walk 10 feet on uneven surfaces activity did not occur: Safety/medical concerns         Wheelchair     Assist Is the patient using a wheelchair?: Yes Type of Wheelchair: Power    Wheelchair assist level: Supervision/Verbal cueing Max wheelchair distance: 300+    Wheelchair 50 feet with 2 turns activity    Assist        Assist Level: Supervision/Verbal cueing   Wheelchair 150 feet activity     Assist      Assist Level: Supervision/Verbal cueing   Blood pressure 129/76, pulse 96, temperature 98 F (36.7 C), temperature source Oral, resp. rate 16, height 5\' 9"  (1.753 m), weight 98.4 kg, SpO2 100%.   Medical Problem List and Plan: 1. Functional deficits secondary to debility related to septic shock/C. difficile colitis/AKI superimposed on CKD stage IV             -patient may shower with tubes/drains covered             -ELOS/Goals:  3/28, min assist PT/OT             -Continue CIR therapies including PT, OT   -Team meeting scheduled for Tuesday  -family/ team meeting  Tuesday at 1 PM.  -Daily therapy for now  -Family meeting completed yesterday  -Will call insurance peer to peer-called and scheduled 2 time periods for call back  -Exp DC 4/18 currently    2.  Antithrombotics: -DVT/anticoagulation:  Pharmaceutical: Eliquis             -antiplatelet therapy: N/A   3. Pain Management: tramadol prn, Zanaflex 4 mg twice daily as needed   Biliateral OA of knees  -ordered bilateral neoprine knee sleeves --try today  -voltaren gel tid to knees  -have scheduled tramadol 50mg  at 0700 and 1200 daily. Continue prn as well  Mild adhesive capsulitis left shoulder---  sports cream. Can use voltaren  gel also. Aggressive ROM with therapy and while in bed--continue  -Will check right knee x-ray, left knee x-ray on 3/8 with advanced OA.  Will consult orthopedics for cortisone injections  -3-28: Orthopedics aspirated and injected left knee, with improvement.  Ongoing pain with right knee.  3/31 Knee fluid calcium pyrophosphate cyrystals, pseudogout?, cultures with no growth  -4/2 hydrocodone PRN started yesterday for flank pain  4/3 reconsult  orthopedics, will come up and talk to him today about his right knee.  I had spoken with orthopedics on 4/1 are considering oral steroids due to crystals, pseudogout?  But held off due to more acute medical issues  4/4 right knee is feeling better continue to monitor  4/5 continues to be improved although has not had therapy today  4/15 Knee pain continues with activity, continue tramadol    4. Mood/Behavior/Sleep: Trazodone 50 mg nightly as needed.  Provide emotional support             -antipsychotic agents: N/A   5. Neuropsych/cognition: This patient is capable of making decisions on his own behalf.   6. Skin/Wound Care: Routine skin checks   7. Fluids/Electrolytes/Nutrition:    -3/14 po intake remains poor, albumin low, weights falling  -will ask RD for assistance. Have spoken to patient about intake  -?IV albumin (see below)  3/18 discussed with dietitian, patient recorded is eating 0% of his meals.  Appears that wife bringing in 3 meals a day and he is eating about 75%, appears to have fairly adequate intake.  3/27 eating frequent outside foods-doesn't like hospital food as much  4/7 encouraged pt to eat any food he feels like. More important to just get calories in at this point  4/11 appetite is improving  8.  Oliguric AKI superimposed on CKD stage IV.  Baseline creatinine 4.9.  CRRT discontinued 2/25.  No current plan for long-term dialysis.  Follow-up renal services              - Per discussion with nephrology Dr. America Bake, keep Foley  catheter in to allow accurate I's and O's through Monday; then, can remove and do DC Foley trial while continuing to document strict outputs             - Continue daily assessments of renal function per nephrology   3/8- per renal, Cr leveling out- and con't Foley- will remove Monday  -nephrology following, Cr remains in 4's.   3/14 renal function with some improvement today     -suspect lower extremity edema is related to renal function/low albumin   Appreciate nephro input   -3/17 BUN/creatinine slightly improved 34/3.31, appreciate nephrology assistance  3/19 BUN/CR stable at 34/3.24, nephrology signing off, ok to check BMET twice a week  3/23 encouraging po this weekend. happy that he's eating food from home  3/24 BUN/CR overall stable at 40/3.26  -3/31 BUN/Cr a little higher today, IVF 56ml/hr  -4/1 Cr a little higher again, will contact nephrology  -4/3 nephrology note reviewed, IV Lasix discontinued and he was given some Lasix today, changed oral bicarb by nephrology, can give IV as needed  4/4 patient has a lot of edema, nephrology reducing Lasix today and holding IVF creatinine higher at 5.14  4/5 reviewed nephrology note, patient getting albumin and Lasix today  4/7 labs reviewed, similar to yesterday, ongoing mgt per nephro  4/8 Cr stable for past few days, CT ordered by nephrology to r/o other obstructive issue  4/9 discussed CT scan results with urology by phone, no interventions recommended at this time. Cr stable. Will await formal note for recommendations.   4/10 nephrology has ordered removal/replace foley, may need RRT-appears urology recommended to keep current Foley in place  4/16 Cr 5.78 today, continue to monitor   9.  C. difficile colitis.  Completed course of oral vancomycin 3/7.  DC'd IV Flagyl 3/3. Enteric precautions   3/14 stools formed this  morning   3/17 discussed with ID pharmacy, patient having more frequent bowel movements.  Monitor today and if continues  consider Dificid treatment  3/18 diarrhea appears to be improving, continue to monitor for now  3/19 Frequent Bms improved, continue to follow  3/21 having more frequent bowel movements again today, continue to monitor and if frequent diarrhea persists this weekend consider treatment with Dificid  3/23- mushy/lqiuid stool again overnight    -no dificid at this point. Discuss with GI first on Monday    -added fiber to regimen yesterday, will increase to bid today  3/24 mushy/ liquid BMs, about 2 a day yesterday, more other days- will discuss GI  3/25 GI recommended repeat C. difficile screening. Discussed with Arzella Laurence, hold off on repeat screening test, he only had about 2 bowel movements a day but if continues to have frequent BMs consider retreatment with oral vancomycin for 10 days  3/26 pt reports Bms more firm, continue to monitor   3/28 patient reports BMs have been more solid, continue to monitor.  If worsening diarrhea occurs consider retreatment with vancomycin oral for 10 days  3-29: Diarrhea this a.m., no bowel movements this afternoon recorded  3-30: No BM since yesterday.  3-31 diarrhea continues to be improved overall  4-3 LBM yesterday, reports diarrhea continues to be improved  -4/4 patient with increased diarrhea, discussed with hospitalist and ID.  Restarted oral vancomycin 125 mg 4 times a day for 10 days  -4/5 continue vancomycin oral, hospitalist following appreciate assistance  -4/6 discussed with ID today, will change to Dificid  -4/7 WBC's sl improved to 53k today. Continue fidaxomicin, fiber,etc   -appreciate ID f/u  4/8 WBC a little better today 52.1, continue to monitor. Still having frequent Bms  4/9 WBC improved to 40.4, continue to monitor. Diarrhea a little better  4/10 WBC decreased 27.8 Much improved, continue current regimen  4/14 diarrhea improved  4/15 DC dificit today- completed 10 days  4/16 Pt reports diarrhea remains improved, recheck CBC  tomorrow  10.  Status post left nephrostomy tube d/t staghorn calculi. Has Foley Nephrostomy tube placed 1/29 per IR with plan for ureteric stent placement 3/21              - Careful monitoring of nephrostomy output  -3/14 flomax has been added to improve emptying  -3/21 patient scheduled today for cystoscopy with left retrograde pyelogram, left ureteroscopy laser lithotripsy and left ureteral stent placement.     3/22- procedure appears successful thus far.    -urine clear, patient comfortable   -foley to come out on Monday per urology  3/25 Foley was removed patient continues to have urinary retention requiring IC.  Increase Flomax to 0.8 mg  3/26 intermittent blood noted with IC, if continues will restart foley  3/27 Urine no longer blood tinged, continue current regimen, consider restart foley if reoccurs  3/27 continues to require catheterization but no longer blood-tinged Addendum 3/28 foley started after traumatic cath, possible false passage, continue foley for now, f/u with urology outpatient 4/1 foley with hematuria, denies known trauma to this, will ask nursing to check bladder scan. U/A culture ordered.  Called his urologist to discuss 4/2 CT abdomen/pelvis-bilateral obstructive uropathy with collecting system hematuria, large calculus in right ureter and pelvic inlet, new left hydronephrosis, new anasarca, liver nodules raising possibility of cirrhosis.  Discussed patient with urology - surgical procedure today  Addendum discussed with urology, will hold eliquis for now due to bleeding, check  EKG- A flutter -4/3 cystoscopy, right retrograde pyelogram, right ureteral stent insertion yesterday by Dr. Pete Glatter, further procedure scheduled on 4/15 with Dr. Mena Goes.  Discussed with [facility] urology.  Do not think traumatic cath last week caused recurrent bleeding his kidney, his wife was concerned about this.  Patient was having nonbloody urine for several days on the Foley.   Will continue Foley and not attempt intermittent cath.  Patient suspected to have false passage. 4/4 urine appears less dark today, urology considering TXA tomorrow if bleeding is not resolved, Eliquis on hold 4/5 appreciate urology assistance, bleeding does appear to be improving, only blood-tinged urine in Foley tubing.  Consider restart Eliquis when 4/6 Discussed with urology,  bleeding improving, hold off on TXA, continue to hold eliquis for today due to high risk of bleeding 4/7- urine appears to be clearing although hgb down to 8.7 today  -continue serial labs  -don' think TXA is indicated right now  -would keep eliquis on hold 4/8 Continue to hold eliques, could consider heparin trail- discuss with hospitalist- CT scan results before considering 4/9 Will check EKG to see if still having afib/flutter, consider heparin infusion, will hold off today as urine looks a little pink/blood tinged 4/10 Dark urine today, Pt was seen by urology, continue to monitor 4/11 Urine less dark today, continue to monitor 4/16 s/p urethroscopy/laser lithotripsy b/l yesterday by Dr. Mena Goes   11.  Chronic atrial fibrillation with episodes of bradycardia.  Followed by cardiology services.  Continue Eliquis.  Avoid AV nodal blocking agents -4/11 cardiology consulted appreciate assistance, rate control.  Continue to hold anticoagulation for now.  Cardiology may consider left atrial appendage occlusion device after discharge.   12.  Diabetes mellitus.  Hemoglobin A1c 6.2.    -tightly controlled. Dc SSI and change cbg checks to qam only   CBG (last 3)  Recent Labs    08/18/23 1306 08/18/23 1643 08/19/23 0834  GLUCAP 86 104* 107*       3-30: Recently increased.  Somewhat expected with intra-articular steroid injection.  Continue to trend.  4/5-7 controlled, continue to monitor   controlled, continue to monitor   13.  Hypotension.  continue midodrine to 5mg  TID PRN orthostatic hypotension        08/19/2023   12:58 PM 08/19/2023    5:59 AM 08/19/2023    5:00 AM  Vitals with BMI  Weight   216 lbs 15 oz  BMI   32.02  Systolic 129 127   Diastolic 76 73   Pulse 96 92     14.  Chronic anemia.  continue Niferex daily/Aranesp.  Monitor for any bleeding episodes   3/8- Hb 7.1 today- will order transfusion- transfusion of 1 unit pRBCs.   3/14 hgb up to 9.1  -3/17 hemoglobin down to 8.4, continue to monitor  -3/19 HGB 8.3 today, stable overall, continue to monitor    -3/27 HGB 7.7- a little lower likely due to GU procedure/hematuria, continue to monitor  3/31 HGB 7.6 today, transfuse 7 or less  4/7 hgb down to 8.7. hematuria better--continue to monitor serially   4/15 overall stable at 7.9  16. Leukocytosis  3/9- Pt's WBC is 21.7- up from 16k- is afebrile- due to his recent C diff, that just finished correct dosing of ABX for, called ID for guidance on treatment- don't want to cause more Cdiff- ID consulted ---no new orders  3/10 WBC's down to 17k  --have been hovering above and below 20 for awhile.  No new clinical symptoms. Nephrostomy drainage clear, yellow  3/14 wbc's down to 13k.--recheck Monday   3/17 WBC is down to 12.4  3/19 WBC 13.4 today, continue to monitor  3/24 WBC higher today 18.7, Discuss C diff treatment with GI a above 3/31 WBC still elevated 19.0, monitor for signs of infection 4/3 WBC still elevated to 23.8, potentially due to his urological issues and procedure.  Hospitalist also following appreciate assistance  4/4 WBC higher today, oral vancomycin started as above.  Discussed with ID Lalla Pill and hospitalist.  Patient had Pseudomonas resulting in prior urine culture.  Recommended cefepime for approximately 10 days depending on WBC.  Called pharmacy for cefepime dosing.  ID recommended replacing Foley due to yeast on last culture, discussed with urology who indicates this was already done so hold off on changing again.  4/5 continue current antibiotic regimen,  hospitalist team following.  WBC higher today-discussed with hospitalist yesterday, often see higher WBCs with C. difficile.  Continue monitor trend daily.  Patient remains afebrile and vital stable  4/7 WBC's 53k, sl improvement   -continue fidaxomicin   -appreciate ID help   -serial labs  4/10 WBC improved to 27  4/15 down to 17.7 today  Recheck tomorrow   17. Decreased mood reported by therapy  -Consider SSRI  -3/20 patient reports his mood is okay, denies depression this morning.  continue to monitor  18. Insomnia   continue trazodone  19. LE edema  -Significantly improved on the left after steroid injection and effusion tap.  -IVF discontinued as above  - Nephrology following, treating with Lasix, albumin  -4/7 edema with some improvement. albumin /lasix per nephro   -elevate/compression   -improve nutrition Weight reviewed and is stable  20. Low MG and potassium  -4/8 mg and K+ repletion today  -4/9 addition K+ was ordered  4/11 K+ stable 3.6, MG 2.0  4/15 Replete - discussed dose with pharmacy. Twitching may be related to low K+  4/16 K+ stable 3.7    Mobility Assessment  Jaivian Battaglini was seen for the purpose of a mobility assessment for a powered wheelchair. I have reviewed and agree with the detailed PT evaluation. Ambrosio Junker Brittingham suffers from chronic weakness, joint pain  related to DM, CKD, Knee OA, A fib. Due to joint pain, decreased endurance and obesity he is unable to utilize a cane, walker, manual wheelchair, or scooter. The patient is appropriate for a customized power wheelchair.  With a power wheelchair  Jamichael Reever can move independently at a household level and on a limited basis in the community. The chair will also allow the patient to perform ADL's more easily. The patient is competent to operate the recommended chair on his own and is motivated to utilize the chair on a daily basis.    LOS: 40 days A FACE TO FACE EVALUATION WAS  PERFORMED  Lylia Sand 08/19/2023, 3:55 PM

## 2023-08-19 NOTE — Progress Notes (Signed)
 Patient ID: Jyaire Koudelka, male   DOB: 06/01/1950, 73 y.o.   MRN: [id number] S: feels a little sore after the procedure yesterday  O:BP 127/73 (BP Location: Left Arm)   Pulse 92   Temp 98 F (36.7 C) (Oral)   Resp 16   Ht 5\' 9"  (1.753 m)   Wt 98.4 kg   SpO2 100%   BMI 32.04 kg/m   Intake/Output Summary (Last 24 hours) at 08/19/2023 1046 Last data filed at 08/19/2023 0600 Gross per 24 hour  Intake 350 ml  Output 1305 ml  Net -955 ml   Intake/Output: I/O last 3 completed shifts: In: 350 [IV Piggyback:350] Out: 1505 [Urine:1500; Blood:5]  Intake/Output this shift:  No intake/output data recorded. Weight change: 0 kg Gen: NAD CVS: RRR Resp:CTA Abd: +BS, soft, NT/ND Ext: trace pretibial edema  Recent Labs  Lab 08/12/23 1628 08/13/23 0539 08/14/23 0611 08/15/23 1300 08/16/23 0949 08/18/23 0529 08/18/23 1351 08/19/23 0520  NA 133* 132* 131* 131* 133* 133* 136 134*  K 3.6 3.6 3.6 3.4* 3.4* 2.9* 3.6 3.7  CL 103 103 107 105 107 109 112* 116*  CO2 17* 15* 13* 14* 13* 12*  --  11*  GLUCOSE 116* 101* 86 108* 88 118* 87 125*  BUN 75* 75* 73* 70* 71* 68* 68* 65*  CREATININE 5.61* 5.55* 5.49* 5.85* 5.85* 6.02* 6.90* 5.78*  ALBUMIN  --   --   --   --  2.0* 1.7*  --  1.9*  CALCIUM 8.0* 7.7* 8.1* 7.7* 8.0* 8.0*  --  7.7*  PHOS  --   --   --   --  7.2* 6.9*  --  7.3*   Liver Function Tests: Recent Labs  Lab 08/16/23 0949 08/18/23 0529 08/19/23 0520  ALBUMIN 2.0* 1.7* 1.9*   No results for input(s): "LIPASE", "AMYLASE" in the last 168 hours. No results for input(s): "AMMONIA" in the last 168 hours. CBC: Recent Labs  Lab 08/13/23 0539 08/14/23 0611 08/15/23 1300 08/18/23 0529 08/18/23 1351  WBC 27.8* 26.8* 24.8* 17.7*  --   HGB 8.5* 8.3* 8.6* 7.9* 9.9*  HCT 26.9* 26.1* 27.5* 25.5* 29.0*  MCV 83.3 82.6 84.1 84.7  --   PLT 300 301 289 261  --    Cardiac Enzymes: No results for input(s): "CKTOTAL", "CKMB", "CKMBINDEX", "TROPONINI" in the last 168 hours. CBG: Recent  Labs  Lab 08/17/23 0612 08/18/23 0615 08/18/23 1306 08/18/23 1643 08/19/23 0834  GLUCAP 90 106* 86 104* 107*    Iron Studies: No results for input(s): "IRON", "TIBC", "TRANSFERRIN", "FERRITIN" in the last 72 hours. Studies/Results: DG C-Arm 1-60 Min-No Report Result Date: 08/18/2023 Fluoroscopy was utilized by the requesting physician.  No radiographic interpretation.   DG C-Arm 1-60 Min-No Report Result Date: 08/18/2023 Fluoroscopy was utilized by the requesting physician.  No radiographic interpretation.    Chlorhexidine Gluconate Cloth  6 each Topical BID   darbepoetin (ARANESP) injection - NON-DIALYSIS  100 mcg Subcutaneous Q Fri-1800   diclofenac Sodium  2 g Topical QID   Gerhardt's butt cream   Topical BID   iron polysaccharides  150 mg Oral Daily   multivitamin  1 tablet Oral QHS   Muscle Rub   Topical TID   psyllium  1 packet Oral BID   sodium bicarbonate  1,300 mg Oral TID   tamsulosin  0.8 mg Oral QPC supper   traMADol  50 mg Oral BID   traZODone  75 mg Oral QHS    BMET  Component Value Date/Time   NA 134 (L) 08/19/2023 0520   NA 139 06/25/2021 1539   K 3.7 08/19/2023 0520   CL 116 (H) 08/19/2023 0520   CO2 11 (L) 08/19/2023 0520   GLUCOSE 125 (H) 08/19/2023 0520   BUN 65 (H) 08/19/2023 0520   BUN 22 06/25/2021 1539   CREATININE 5.78 (H) 08/19/2023 0520   CALCIUM 7.7 (L) 08/19/2023 0520   GFRNONAA 10 (L) 08/19/2023 0520   CBC    Component Value Date/Time   WBC 17.7 (H) 08/18/2023 0529   RBC 3.01 (L) 08/18/2023 0529   HGB 9.9 (L) 08/18/2023 1351   HGB 13.2 06/25/2021 1539   HCT 29.0 (L) 08/18/2023 1351   HCT 41.2 06/25/2021 1539   PLT 261 08/18/2023 0529   PLT 367 06/25/2021 1539   MCV 84.7 08/18/2023 0529   MCV 84 06/25/2021 1539   MCH 26.2 08/18/2023 0529   MCHC 31.0 08/18/2023 0529   RDW 17.4 (H) 08/18/2023 0529   RDW 12.6 06/25/2021 1539   LYMPHSABS 0.9 07/17/2023 0856   LYMPHSABS 2.4 06/25/2021 1539   MONOABS 1.0 07/17/2023 0856    EOSABS 0.3 07/17/2023 0856   EOSABS 0.2 06/25/2021 1539   BASOSABS 0.1 07/17/2023 0856   BASOSABS 0.1 06/25/2021 1539     Assessment/Plan:72yo M A fib, DM, A fib on eliquis, nephrolithiasis s/p recent PCN for staghorn calculus currently admitted to CIR for debility and nephrology is reconsulted for AKI on CKD.   **AKI on CKD 4:   Baseline kidney function 05/2023 Cr 4.1 - 5.3 f/b CKA.  After CRRT stopped his creatinine improved to nadir of 3.2 on 3/19 but gradually trended up 3/27 3.6 > 3/31 4.2 > 4/1 4.45 . Had staghorn calculus on L s/p PCN. Found to have obstructing stone on R s/p OR with ureteral stent 4/2.  Cr trending up further 5.14 > 5.85 but had been stable for 2 days. Will need to recheck tomorrow and hold off on HD for now.  - .  Remains nonoliguric with no uremic symptoms; no RRT needed at this time but discussed need may arise this hospitalization.            -  CT scan recently  to rule out any other obstructive insults-  possibly                          - appreciate urology input-  s/p bilateral ureteroscopy with laser lithotripsy, stent exchange, and foley exchange by Dr. Mena Goes yesterday. BUN/Cr down to 65/5.78 respectively.  Increased UOP overnight.             - continue to follow daily BUN/Cr and UOP. - no indication for dialysis at this time    **Anemia, normocytic:  Transfuse as needed for Hb < 7.  Having acute blood loss through foley but H/H stable.  Eliquis has been held.  On aranesp.    **Metabolic acidosis:  worsening in context of AKI; in light of worsening overload d/cd bicarb gtt and switched to oral.    **A fib:  eliquis on hold for acute bleeding   **Orthostatic hypotension:  has been on midodrine for the last few weeks, BPs look ok currently   **Hypokalemia - given supplements today and follow.   Irena Cords, MD [employer] [phone]

## 2023-08-19 NOTE — Progress Notes (Signed)
 Physical Therapy Session Note  Patient Details  Name: Calvin Schultz MRN: [id number] Date of Birth: 1950-05-07  Today's Date: 08/19/2023 PT Individual Time: 9147-8295 PT Individual Time Calculation (min): 30 min   Short Term Goals: Week 5:  PT Short Term Goal 1 (Week 5): STG=LTG 2/2 ELOS  Skilled Therapeutic Interventions/Progress Updates:      Pt supine in bed upon arrival. Pt wife reports she donned his pants, ted hose, knee brace, and shoes this morning, however has not gotten opportunity to donn shirt.  Discussed trialing hoyer lift transfer as therapist recommending it for home pt/caregiver overall safety, however pt and caregiver refusing hoyer lift for home. Both pt and wife report they feel comfortable with doing slide board transfer at home.   Education provided that Sebastian from [employer] will be coming to pt house early next week to answer any questions regarding the loaner WC. Pt and caregiver also have his contact information.   Pt refusing to get OOB during session 2/2 fatigue.   Treatment Session focused on education to pt wife how to operate and utilize manual override as needed same model WC as pt WC for home as their are minor differences between it and the model he is currently using. Education provided for how to adjust speed, and all power controls. Reiterated recommendation for supervision needed for safety with power WC. Pt verbalized understanding and returned demonstration.   Pt supine in bed with bed alarm on and needs within reach.    Therapy Documentation Precautions:  Precautions Precautions: Fall Precaution/Restrictions Comments: Foley, bilateral knee pain and decreased ROM Restrictions Weight Bearing Restrictions Per Provider Order: No   Therapy/Group: Individual Therapy  [facility] [place], [place], DPT  08/19/2023, 1:04 PM

## 2023-08-19 NOTE — Progress Notes (Signed)
   1 Day Post-Op Subjective: NAEON. Calvin Schultz is very hoarse, but otherwise looks well today.  Objective: Vital signs in last 24 hours: Temp:  [97.3 F (36.3 C)-98 F (36.7 C)] 98 F (36.7 C) (04/16 0559) Pulse Rate:  [92-113] 92 (04/16 0559) Resp:  [9-18] 16 (04/16 0559) BP: (114-135)/(66-80) 127/73 (04/16 0559) SpO2:  [92 %-100 %] 100 % (04/16 0559) Weight:  [98.4 kg-99.2 kg] 98.4 kg (04/16 0500)  Assessment/Plan: #bilateral ureteral stones #urethral trauma- resolved  Urethroscopy yesterday shows healed mucosa.  S/p laser lithotripsy bilaterally w/ Dr. Derrick Fling 4/15.  Remove foley with tethered stents 5d post-op. If still in hospital, I will remove them. Otherwise, close follow up in clinic.   Intake/Output from previous day: 04/15 0701 - 04/16 0700 In: 350 [IV Piggyback:350] Out: 1305 [Urine:1300; Blood:5]  Intake/Output this shift: No intake/output data recorded.  Physical Exam:  General: Alert and oriented CV: No cyanosis Lungs: equal chest rise Abdomen: Soft, NTND, no rebound or guarding Gu: Foley in place with tethered stents  Lab Results: Recent Labs    08/18/23 0529 08/18/23 1351  HGB 7.9* 9.9*  HCT 25.5* 29.0*   BMET Recent Labs    08/18/23 0529 08/18/23 1351 08/19/23 0520  NA 133* 136 134*  K 2.9* 3.6 3.7  CL 109 112* 116*  CO2 12*  --  11*  GLUCOSE 118* 87 125*  BUN 68* 68* 65*  CREATININE 6.02* 6.90* 5.78*  CALCIUM 8.0*  --  7.7*     Studies/Results: DG C-Arm 1-60 Min-No Report Result Date: 08/18/2023 Fluoroscopy was utilized by the requesting physician.  No radiographic interpretation.   DG C-Arm 1-60 Min-No Report Result Date: 08/18/2023 Fluoroscopy was utilized by the requesting physician.  No radiographic interpretation.      LOS: 40 days   Calvin Ar, NP [facility] Pager: [phone]  08/19/2023, 10:37 AM

## 2023-08-19 NOTE — Progress Notes (Signed)
 Occupational Therapy Session Note  Patient Details  Name: Calvin Schultz MRN: [id number] Date of Birth: 06/23/1950  Session 1: {CHL IP REHAB OT TIME CALCULATIONS:304400400} Session 2: {CHL IP REHAB OT TIME CALCULATIONS:304400400}  Short Term Goals: Week 6:  OT Short Term Goal 1 (Week 6): Pt will consistently demonstrate lateral scoot transfer at Min A with use of SB when transferring from surface to surface. OT Short Term Goal 2 (Week 6): Pt will complete toileting with Mod A while utilizing drop arm BSC.  Skilled Therapeutic Interventions/Progress Updates:    Session 1: Patient agreeable to participate in OT session. Reports *** pain level.   Patient participated in skilled OT session focusing on ***. Therapist facilitated/assessed/developed/educated/integrated/elicited *** in order to improve/facilitate/promote   Session 2: Patient agreeable to participate in OT session. Reports *** pain level.   Patient participated in skilled OT session focusing on ***. Therapist facilitated/assessed/developed/educated/integrated/elicited *** in order to improve/facilitate/promote    Therapy Documentation Precautions:  Precautions Precautions: Fall Precaution/Restrictions Comments: Foley, bilateral knee pain and decreased ROM Restrictions Weight Bearing Restrictions Per Provider Order: No    Therapy/Group: Individual Therapy  Carollee Circle, OTR/L,CBIS  Supplemental OT - [facility] Secure Chat Preferred   08/19/2023, 9:22 PM

## 2023-08-19 NOTE — Progress Notes (Signed)
 Patient ID: Calvin Schultz, male   DOB: [birth year], 73 y.o.   MRN: [id number]  Met with pt and wife to give team conference update regarding progress ion this week and plan for discharge Friday. Wife continues to do hands on education and feels comfortable with the care needs and transfer. Both do not want a hoyer lift feel transfer board works better for wife. Discussed ambulance transport home and will set up for 10;00 am. Have given wife wheelchair Calvin Schultz list to use for follow up appointments if needed. Continue to work on discharge for Friday.

## 2023-08-19 NOTE — Plan of Care (Signed)
  Problem: RH BOWEL ELIMINATION Goal: RH STG MANAGE BOWEL WITH ASSISTANCE Description: STG Manage Bowel withtoileting Assistance. Outcome: Progressing   Problem: RH BLADDER ELIMINATION Goal: RH STG MANAGE BLADDER WITH ASSISTANCE Description: STG Manage Bladder With equipment/min Assistance Outcome: Progressing   Problem: RH SKIN INTEGRITY Goal: RH STG SKIN FREE OF INFECTION/BREAKDOWN Description: Maintain skin free of infection with min assist Outcome: Progressing   Problem: RH SAFETY Goal: RH STG ADHERE TO SAFETY PRECAUTIONS W/ASSISTANCE/DEVICE Description: STG Adhere to Safety Precautions With cues Assistance/Device. Outcome: Progressing   Problem: RH PAIN MANAGEMENT Goal: RH STG PAIN MANAGED AT OR BELOW PT'S PAIN GOAL Description: < 4 with prns Outcome: Progressing

## 2023-08-19 NOTE — Progress Notes (Signed)
 Occupational Therapy Session Note  Patient Details  Name: Calvin Schultz MRN: [id number] Date of Birth: [birth year]  Today's Date: 08/19/2023 OT Individual Time: 9811-9147 OT Individual Time Calculation (min): 28 min    Short Term Goals: Week 6:  OT Short Term Goal 1 (Week 6): Pt will consistently demonstrate lateral scoot transfer at Min A with use of SB when transferring from surface to surface. OT Short Term Goal 2 (Week 6): Pt will complete toileting with Mod A while utilizing drop arm BSC.  Skilled Therapeutic Interventions/Progress Updates:    Patient agreeable to participate in OT session. Reports on-going bilateral knee pain during session. Monitored during session.    Patient participated in skilled OT session focusing on patient/family education in order to prepare for planned discharge this week.  Wife participated in bed mobility and functional transfer utilizing SB to complete lateral scoot transfer from bed to powered WC.  Pt utilized bed features to bring [facility] completely elevated along with bed rail on left side to transition from supine to sitting EOB. Requiring increased time to complete and VC from wife for technique and to remain on task.   Wife placed SB with min VC for placement strategy from OT. Wife managed powered WC and set-up next to bed with OT provided recommendations.   Pt completed functional transfer with OT stabilizing SB. Required increased time d/t pt's fatigue level and decreased endurance. Once seated on powered WC, pt was required to scoot hips back in chair. Initially pt attempted to tilt WC back to scoot hips back although OT educated pt to first attempt to scoot hips back prior to using tilt feature.   OT assisted wife with scooting pt's hips back in the chair after several attempts were made by pt and his verbalization of increased fatigue.    Therapy Documentation Precautions:  Precautions Precautions: Fall Precaution/Restrictions Comments: Foley,  bilateral knee pain and decreased ROM Restrictions Weight Bearing Restrictions Per Provider Order: No  Therapy/Group: Individual Therapy  Carollee Circle, OTR/L,CBIS  Supplemental OT - [facility] Secure Chat Preferred   08/19/2023, 8:05 AM

## 2023-08-19 NOTE — Progress Notes (Signed)
 Occupational Therapy Discharge Summary  Patient Details  Name: Calvin Schultz MRN: [id number] Date of Birth: [birth year]  Date of Discharge from OT service:August 20, 2023    Patient has met {NUMBERS 0-12:18577} of {NUMBERS 0-12:18577} long term goals due to {due to:3041651}.  Patient to discharge at overall {LOA:3049010} level.  Patient's care partner {care partner:3041650} to provide the necessary {assistance:3041652} assistance at discharge.    Reasons goals not met: ***  Recommendation:  Patient will benefit from ongoing skilled OT services in home health setting to continue to advance functional skills in the area of BADL and Reduce care partner burden.  Equipment: {equipment:3041657}  Reasons for discharge: discharge from hospital  Patient/family agrees with progress made and goals achieved: Yes  OT Discharge Precautions/Restrictions    General   Vital Signs Therapy Vitals Temp: 98.2 F (36.8 C) Temp Source: Oral Pulse Rate: 65 Resp: 16 BP: 112/60 Patient Position (if appropriate): Lying Oxygen Therapy SpO2: 100 % O2 Device: Room Air Pain   ADL ADL Eating: Set up Where Assessed-Eating: Bed level Grooming: Setup Where Assessed-Grooming: Edge of bed Upper Body Bathing: Minimal assistance Lower Body Bathing: Dependent (secondary to joint constriction associated with arthritis) Where Assessed-Lower Body Bathing: Bed level Upper Body Dressing: Minimal assistance (Secondary to challenges with AROM for BUE coordination) Where Assessed-Upper Body Dressing: Bed level Lower Body Dressing: Dependent Where Assessed-Lower Body Dressing: Bed level Toileting: Dependent Where Assessed-Toileting: Bed level Toilet Transfer: Dependent (challenges with weight bearing through BLE, primarily his knees) Toilet Transfer Method: Other (comment) (Requires the assistance of device) Toilet Transfer Equipment: Other (comment) (didn't occur based on observation of performance with BLE  pt presented as a safety risk.) Tub/Shower Transfer: Dependent Tub/Shower Transfer Method:  (didn't occur based on the pt's performance with BLE) Tub/Shower Equipment: Other (comment) (didn't occur secondary to safety concerns with limited mobility of BLE) Film/video editor: Dependent Film/video editor Method: Fish farm manager (based on limitation with BLE) Astronomer: Other (comment) (shower didn't occur, however, based on limited mobility with BLE the pt presents a Dep at this time and would require the Maxi for safe transfer) Vision   Perception    Praxis   Cognition   Sensation   Motor    Mobility     Trunk/Postural Assessment     Balance   Extremity/Trunk Assessment       Carollee Circle, OTR/L,CBIS  Supplemental OT - [facility] Secure Chat Preferred   08/19/2023, 9:26 PM

## 2023-08-20 ENCOUNTER — Other Ambulatory Visit (HOSPITAL_COMMUNITY): Payer: Self-pay

## 2023-08-20 LAB — RENAL FUNCTION PANEL
Albumin: 1.9 g/dL — ABNORMAL LOW (ref 3.5–5.0)
Anion gap: 11 (ref 5–15)
BUN: 66 mg/dL — ABNORMAL HIGH (ref 8–23)
CO2: 12 mmol/L — ABNORMAL LOW (ref 22–32)
Calcium: 8.1 mg/dL — ABNORMAL LOW (ref 8.9–10.3)
Chloride: 114 mmol/L — ABNORMAL HIGH (ref 98–111)
Creatinine, Ser: 5.92 mg/dL — ABNORMAL HIGH (ref 0.61–1.24)
GFR, Estimated: 10 mL/min — ABNORMAL LOW (ref >60)
Glucose, Bld: 105 mg/dL — ABNORMAL HIGH (ref 70–99)
Phosphorus: 6.9 mg/dL — ABNORMAL HIGH (ref 2.5–4.6)
Potassium: 3.5 mmol/L (ref 3.5–5.1)
Sodium: 137 mmol/L (ref 135–145)

## 2023-08-20 LAB — CBC
HCT: 26 % — ABNORMAL LOW (ref 39.0–52.0)
Hemoglobin: 8 g/dL — ABNORMAL LOW (ref 13.0–17.0)
MCH: 26.8 pg (ref 26.0–34.0)
MCHC: 30.8 g/dL (ref 30.0–36.0)
MCV: 87 fL (ref 80.0–100.0)
Platelets: 243 10*3/uL (ref 150–400)
RBC: 2.99 MIL/uL — ABNORMAL LOW (ref 4.22–5.81)
RDW: 18.4 % — ABNORMAL HIGH (ref 11.5–15.5)
WBC: 21.6 10*3/uL — ABNORMAL HIGH (ref 4.0–10.5)
nRBC: 0 % (ref 0.0–0.2)

## 2023-08-20 LAB — GLUCOSE, CAPILLARY: Glucose-Capillary: 103 mg/dL — ABNORMAL HIGH (ref 70–99)

## 2023-08-20 MED ORDER — SODIUM BICARBONATE 650 MG PO TABS
1300.0000 mg | ORAL_TABLET | Freq: Three times a day (TID) | ORAL | 0 refills | Status: AC
Start: 2023-08-20 — End: ?
  Filled 2023-08-20: qty 180, 30d supply, fill #0

## 2023-08-20 MED ORDER — GERHARDT'S BUTT CREAM
1.0000 | TOPICAL_CREAM | Freq: Two times a day (BID) | CUTANEOUS | 0 refills | Status: DC
  Filled 2023-08-20: qty 60, 30d supply, fill #0

## 2023-08-20 MED ORDER — TRAMADOL HCL 50 MG PO TABS
50.0000 mg | ORAL_TABLET | Freq: Two times a day (BID) | ORAL | 0 refills | Status: DC | PRN
  Filled 2023-08-20: qty 30, 15d supply, fill #0

## 2023-08-20 NOTE — Progress Notes (Signed)
 Patient ID: Calvin Schultz, male   DOB: Sep 08, 1950, 73 y.o.   MRN: [id number] Have set up [facility] for 10;00 tomorrow to transport home. Pt and wife aware and in agreement and feel ready. Wife aware of the amount of care husband is. All DME in place for home

## 2023-08-20 NOTE — Progress Notes (Signed)
 Physical Therapy Session Note  Patient Details  Name: Calvin Schultz MRN: [id number] Date of Birth: [birth year]  Today's Date: 08/20/2023 PT Individual Time: 8657-8469 PT Individual Time Calculation (min): 28 min   Short Term Goals: Week 5:  PT Short Term Goal 1 (Week 5): STG=LTG 2/2 ELOS  Skilled Therapeutic Interventions/Progress Updates:      Pt seated in power WC upon arrival. Pt agreeable to therapy. Pt denies any pain.   Therapist trailed beasy slide board to determine appropriateness for use at home, however pt did not demonstrate any increased independence or reduced caregiver burden with beasy slide board in comparison to standard slide board.   Recommended pt wife have +2 assist intiially at home for overall safety and to reduce caregiver burden.   Pt required heavy mod A for sit to supine 2/2 to fatigue and weakness.   Pt supine in bed with all needs within reach and bed alarm on.  Therapy Documentation Precautions:  Precautions Precautions: Fall Precaution/Restrictions Comments: Foley, bilateral knee pain and decreased ROM Restrictions Weight Bearing Restrictions Per Provider Order: No   Therapy/Group: Individual Therapy  [facility] Jenney Mod[place], [place], DPT  08/20/2023, 4:28 PM

## 2023-08-20 NOTE — Progress Notes (Signed)
 Physical Therapy Discharge Summary  Patient Details  Name: Calvin Schultz MRN: [id number] Date of Birth: Sep 17, 1950  Date of Discharge from PT service:August 20, 2023  Patient has met 4 of 6 long term goals due to improved balance, improved postural control, ability to compensate for deficits, and improved caregiver carry over of education.  Patient to discharge at a wheelchair level  mod-max A .   Patient's care partner is independent and would benefit from additional assistance when available to provide the necessary physical assistance at discharge.   Reasons goals not met: Pt limited d/t decreased activity tolerance and endurance, on-going medical complications since initially hospitalized, impaired BUE ROM and strength, and chronic BLE knee pain and ROM issues. Car transfer goal not met 2/2 fatigue/weakness however adequate for discharge 2/2 non emergency medical transport home. Bed mobility goal not met 2/2 pt requiring min-mod A with fatigue--however pt caregiver demonstrates ability to provide adequate assist.   Recommendation:  Patient will benefit from ongoing skilled PT services in home health setting to continue to advance safe functional mobility, address ongoing impairments in endurance, activity tolerance, strength, balance, and minimize fall risk.  Equipment: Hospital bed, power WC, slide board, drop arm BSC  Reasons for discharge: treatment goals met and discharge from hospital  Patient/family agrees with progress made and goals achieved: Yes  PT Discharge Precautions/Restrictions Precautions Precautions: Fall Precaution/Restrictions Comments: Foley, bilateral knee pain and decreased ROM Restrictions Weight Bearing Restrictions Per Provider Order: No Pain Interference Pain Interference Pain Effect on Sleep: 1. Rarely or not at all Pain Interference with Therapy Activities: 1. Rarely or not at all Pain Interference with Day-to-Day Activities: 1. Rarely or not at  all Vision/Perception  Vision - History Ability to See in Adequate Light: 0 Adequate Perception Perception: Within Functional Limits Praxis Praxis: WFL  Cognition Overall Cognitive Status: Within Functional Limits for tasks assessed Arousal/Alertness: Awake/alert Orientation Level: Oriented X4 Memory: Impaired Memory Impairment: Retrieval deficit Awareness: Appears intact Problem Solving: Impaired Safety/Judgment: Impaired Comments: requires VC for powered WC features related to safety awareness/judgement Sensation Sensation Light Touch: Appears Intact Hot/Cold: Appears Intact Proprioception: Impaired by gross assessment Stereognosis: Appears Intact Additional Comments: posterior bias when seated EOB and fatigue sets in. VC and physical assist required to self correct Coordination Gross Motor Movements are Fluid and Coordinated: No Fine Motor Movements are Fluid and Coordinated: No Coordination and Movement Description: generalized weakness, limited BUE shoulder ROM, Limited B LE ROM Finger Nose Finger Test: NT 9 Hole Peg Test: NT Motor  Motor Motor: Within Functional Limits  Mobility Bed Mobility Bed Mobility: Rolling Right;Rolling Left;Supine to Sit;Sitting - Scoot to [place] of Bed Rolling Right: Moderate Assistance - Patient 50-74% Rolling Left: Moderate Assistance - Patient 50-74% Supine to Sit: Minimal Assistance - Patient > 75% Sitting - Scoot to Edge of Bed: Moderate Assistance - Patient 50-74% Sit to Supine: Moderate Assistance - Patient 50-74% Transfers Transfers: Lateral/Scoot Transfers Lateral/Scoot Transfers: Moderate Assistance - Patient 50-74% Transfer (Assistive device): Other (Comment) (slide board) Locomotion  Gait Ambulation: No Gait Gait: No Stairs / Additional Locomotion Stairs: No Pick up small object from the floor (from standing position) activity did not occur: Safety/medical concerns Naval architect Mobility: Yes Wheelchair  Assistance: Doctor, general practice: Power Wheelchair Parts Management: Needs assistance Distance: 300+  Trunk/Postural Assessment  Cervical Assessment Cervical Assessment: Exceptions to [facility] (forward head) Thoracic Assessment Thoracic Assessment: Exceptions to [facility] (rounded shoulders) Lumbar Assessment Lumbar Assessment: Exceptions to [facility] (posterior pelvic tilt) Postural Control Postural Control:  Deficits on evaluation  Balance Balance Balance Assessed: Yes Static Sitting Balance Static Sitting - Balance Support: Feet supported;Bilateral upper extremity supported Static Sitting - Level of Assistance: 4: Min assist Dynamic Sitting Balance Dynamic Sitting - Balance Support: During functional activity Dynamic Sitting - Level of Assistance: 4: Min assist Extremity Assessment  RUE Assessment RUE Assessment: Exceptions to [facility] Active Range of Motion (AROM) Comments: Limited shoulder ROM. Able to demonstrate shoulder flexion to ~70-75%. elbow and wrist ranges [facility] General Strength Comments: 3-/5 shoulder flexion/abduction/er, 3/5 IR, 3+/5 elbow flexion/extension, impaired gross grasp. LUE Assessment LUE Assessment: Exceptions to [facility] Active Range of Motion (AROM) Comments: Limited shoulder ROM. ABle to demonstrate 25% shoulder flexion/abduction ROM. Elbow and wrist ranges WFL. General Strength Comments: 3-/5 shoulder flexion/abduction/er, 3/5 IR, 3+/5 elbow flexion/extension, impaired gross grasp. RLE Assessment RLE Assessment: Exceptions to [facility] General Strength Comments: grossly 2-/5, very limited by baseline chronic pain and ROM limitations LLE Assessment LLE Assessment: Exceptions to [facility] General Strength Comments: grossly 2-/5, very limited by baseline chronic pain and ROM limitations   [facility], [place], DPT  08/20/2023, 4:17 PM

## 2023-08-20 NOTE — Plan of Care (Signed)
  Problem: Consults Goal: RH GENERAL PATIENT EDUCATION Description: See Patient Education module for education specifics. Outcome: Progressing   Problem: RH SKIN INTEGRITY Goal: RH STG SKIN FREE OF INFECTION/BREAKDOWN Description: Maintain skin free of infection with min assist Outcome: Progressing Goal: RH STG MAINTAIN SKIN INTEGRITY WITH ASSISTANCE Description: STG Maintain Skin Integrity With min Assistance. Outcome: Progressing   Problem: RH SAFETY Goal: RH STG ADHERE TO SAFETY PRECAUTIONS W/ASSISTANCE/DEVICE Description: STG Adhere to Safety Precautions With cues Assistance/Device. Outcome: Progressing

## 2023-08-20 NOTE — Progress Notes (Signed)
 Patient ID: Calvin Schultz, male   DOB: 1950/12/21, 73 y.o.   MRN: [id number] S: No new complaints O:BP 108/80 (BP Location: Left Arm)   Pulse 67   Temp 98.7 F (37.1 C)   Resp 20   Ht 5\' 9"  (1.753 m)   Wt 95.6 kg   SpO2 100%   BMI 31.12 kg/m   Intake/Output Summary (Last 24 hours) at 08/20/2023 1137 Last data filed at 08/19/2023 1850 Gross per 24 hour  Intake --  Output 451 ml  Net -451 ml   Intake/Output: I/O last 3 completed shifts: In: 480 [P.O.:480] Out: 1601 [Urine:1600; Stool:1]  Intake/Output this shift:  No intake/output data recorded. Weight change:  Gen: NAD CVS: RRR Resp:CTA Abd: _+BS, soft, NT/ND Ext: trace pretibial edema  Recent Labs  Lab 08/14/23 0611 08/15/23 1300 08/16/23 0949 08/18/23 0529 08/18/23 1351 08/19/23 0520 08/20/23 0518  NA 131* 131* 133* 133* 136 134* 137  K 3.6 3.4* 3.4* 2.9* 3.6 3.7 3.5  CL 107 105 107 109 112* 116* 114*  CO2 13* 14* 13* 12*  --  11* 12*  GLUCOSE 86 108* 88 118* 87 125* 105*  BUN 73* 70* 71* 68* 68* 65* 66*  CREATININE 5.49* 5.85* 5.85* 6.02* 6.90* 5.78* 5.92*  ALBUMIN  --   --  2.0* 1.7*  --  1.9* 1.9*  CALCIUM 8.1* 7.7* 8.0* 8.0*  --  7.7* 8.1*  PHOS  --   --  7.2* 6.9*  --  7.3* 6.9*   Liver Function Tests: Recent Labs  Lab 08/18/23 0529 08/19/23 0520 08/20/23 0518  ALBUMIN 1.7* 1.9* 1.9*   No results for input(s): "LIPASE", "AMYLASE" in the last 168 hours. No results for input(s): "AMMONIA" in the last 168 hours. CBC: Recent Labs  Lab 08/14/23 0611 08/15/23 1300 08/18/23 0529 08/18/23 1351 08/20/23 0518  WBC 26.8* 24.8* 17.7*  --  21.6*  HGB 8.3* 8.6* 7.9* 9.9* 8.0*  HCT 26.1* 27.5* 25.5* 29.0* 26.0*  MCV 82.6 84.1 84.7  --  87.0  PLT 301 289 261  --  243   Cardiac Enzymes: No results for input(s): "CKTOTAL", "CKMB", "CKMBINDEX", "TROPONINI" in the last 168 hours. CBG: Recent Labs  Lab 08/18/23 1306 08/18/23 1643 08/19/23 0834 08/19/23 2037 08/20/23 0545  GLUCAP 86 104* 107* 155*  103*    Iron Studies: No results for input(s): "IRON", "TIBC", "TRANSFERRIN", "FERRITIN" in the last 72 hours. Studies/Results: DG C-Arm 1-60 Min-No Report Result Date: 08/18/2023 Fluoroscopy was utilized by the requesting physician.  No radiographic interpretation.   DG C-Arm 1-60 Min-No Report Result Date: 08/18/2023 Fluoroscopy was utilized by the requesting physician.  No radiographic interpretation.    Chlorhexidine Gluconate Cloth  6 each Topical BID   darbepoetin (ARANESP) injection - NON-DIALYSIS  100 mcg Subcutaneous Q Fri-1800   diclofenac Sodium  2 g Topical QID   Gerhardt's butt cream   Topical BID   iron polysaccharides  150 mg Oral Daily   multivitamin  1 tablet Oral QHS   Muscle Rub   Topical TID   psyllium  1 packet Oral BID   sodium bicarbonate  1,300 mg Oral TID   tamsulosin  0.8 mg Oral QPC supper   traMADol  50 mg Oral BID   traZODone  75 mg Oral QHS    BMET    Component Value Date/Time   NA 137 08/20/2023 0518   NA 139 06/25/2021 1539   K 3.5 08/20/2023 0518   CL 114 (H) 08/20/2023 0518  CO2 12 (L) 08/20/2023 0518   GLUCOSE 105 (H) 08/20/2023 0518   BUN 66 (H) 08/20/2023 0518   BUN 22 06/25/2021 1539   CREATININE 5.92 (H) 08/20/2023 0518   CALCIUM 8.1 (L) 08/20/2023 0518   GFRNONAA 10 (L) 08/20/2023 0518   CBC    Component Value Date/Time   WBC 21.6 (H) 08/20/2023 0518   RBC 2.99 (L) 08/20/2023 0518   HGB 8.0 (L) 08/20/2023 0518   HGB 13.2 06/25/2021 1539   HCT 26.0 (L) 08/20/2023 0518   HCT 41.2 06/25/2021 1539   PLT 243 08/20/2023 0518   PLT 367 06/25/2021 1539   MCV 87.0 08/20/2023 0518   MCV 84 06/25/2021 1539   MCH 26.8 08/20/2023 0518   MCHC 30.8 08/20/2023 0518   RDW 18.4 (H) 08/20/2023 0518   RDW 12.6 06/25/2021 1539   LYMPHSABS 0.9 07/17/2023 0856   LYMPHSABS 2.4 06/25/2021 1539   MONOABS 1.0 07/17/2023 0856   EOSABS 0.3 07/17/2023 0856   EOSABS 0.2 06/25/2021 1539   BASOSABS 0.1 07/17/2023 0856   BASOSABS 0.1 06/25/2021  1539     Assessment/Plan:73yo M A fib, DM, A fib on eliquis, nephrolithiasis s/p recent PCN for staghorn calculus currently admitted to CIR for debility and nephrology is reconsulted for AKI on CKD.   **AKI on CKD 4:   Baseline kidney function 05/2023 Cr 4.1 - 5.3 f/b CKA.  After CRRT stopped his creatinine improved to nadir of 3.2 on 3/19 but gradually trended up 3/27 3.6 > 3/31 4.2 > 4/1 4.45 . Had staghorn calculus on L s/p PCN. Found to have obstructing stone on R s/p OR with ureteral stent 4/2.  Cr trending up further 5.14 > 5.85 but had been stable for 2 days .  - .  Remains nonoliguric with no uremic symptoms; no RRT needed at this time but discussed need may arise this hospitalization.                     - appreciate urology input-  s/p bilateral ureteroscopy with laser lithotripsy, stent exchange, and foley exchange by Dr. Derrick Fling 08/18/23. BUN/Cr down to 65/5.78 respectively.  Stable at 66/5.92.  Good UOP overnight.             - continue to follow daily BUN/Cr and UOP. - no indication for dialysis at this time    **Anemia, normocytic:  Transfuse as needed for Hb < 7.  Having acute blood loss through foley but H/H stable.  Eliquis has been held.  On aranesp.    **Metabolic acidosis:  worsening in context of AKI; in light of worsening overload d/cd bicarb gtt and switched to oral.    **A fib:  eliquis on hold for acute bleeding   **Orthostatic hypotension:  has been on midodrine for the last few weeks, BPs look ok currently   **Hypokalemia - given supplements today and follow.   **Disposition - for discharge tomorrow and will need to f/u with our office in 2-3 weeks after discharge and f/u with Dr. Derrick Fling next week.   Benjamin Brands, MD [facility]

## 2023-08-20 NOTE — Progress Notes (Signed)
 PROGRESS NOTE   Subjective/Complaints: No new concerns this morning.  Getting ready for discharge home tomorrow.   ROS: Patient denies fever, chills, headache, shortness of breath, chest pain, nausea, vomiting + Knee pain- continued  + Diarrhea - improved + poor appetite -improved     Objective:   No results found.       Recent Labs    08/18/23 0529 08/18/23 1351 08/20/23 0518  WBC 17.7*  --  21.6*  HGB 7.9* 9.9* 8.0*  HCT 25.5* 29.0* 26.0*  PLT 261  --  243     Recent Labs    08/19/23 0520 08/20/23 0518  NA 134* 137  K 3.7 3.5  CL 116* 114*  CO2 11* 12*  GLUCOSE 125* 105*  BUN 65* 66*  CREATININE 5.78* 5.92*  CALCIUM 7.7* 8.1*      Intake/Output Summary (Last 24 hours) at 08/20/2023 1758 Last data filed at 08/19/2023 1850 Gross per 24 hour  Intake --  Output 450 ml  Net -450 ml         Physical Exam: Vital Signs Blood pressure 125/61, pulse 89, temperature (!) 97.1 F (36.2 C), resp. rate 14, height 5\' 9"  (1.753 m), weight 95.6 kg, SpO2 100%.  Constitutional: No distress . Vital signs reviewed.  Sitting in bed HEENT: NCAT, EOMI, oral membranes moist Neck: supple Cardiovascular: RRR without murmur. No JVD    Respiratory/Chest: CTA Bilaterally without wheezes or rales. Normal effort    GI/Abdomen: BS +, , non-distended, non-tender Ext:  trace LE edema Psych: pleasant and cooperative  GU: Foley draining urine-slightly pink    Skin: No evidence of breakdown, no evidence of rash Neurologic: Cranial nerves II through XII grossly intact, motor strength is moving all 4 extremities to gravity and resistance   MSK: Bilateral knee effusions ongoing.  stable exam 4/17     Assessment/Plan: 1. Functional deficits which require 3+ hours per day of interdisciplinary therapy in a comprehensive inpatient rehab setting. Physiatrist is providing close team supervision and 24 hour management  of active medical problems listed below. Physiatrist and rehab team continue to assess barriers to discharge/monitor patient progress toward functional and medical goals  Care Tool:  Bathing  Bathing activity did not occur:  (patient completed a simulated task at bed LOF) Body parts bathed by patient: Right arm, Left arm, Chest, Abdomen, Front perineal area, Right upper leg, Left upper leg, Face   Body parts bathed by helper: Buttocks, Left lower leg, Right lower leg     Bathing assist Assist Level: Maximal Assistance - Patient 24 - 49%     Upper Body Dressing/Undressing Upper body dressing   What is the patient wearing?: Pull over shirt    Upper body assist Assist Level: Minimal Assistance - Patient > 75%    Lower Body Dressing/Undressing Lower body dressing      What is the patient wearing?: Pants, Incontinence brief     Lower body assist Assist for lower body dressing: Total Assistance - Patient < 25%     Toileting Toileting    Toileting assist Assist for toileting: Total Assistance - Patient < 25%     Transfers Chair/bed transfer  Transfers  assist  Chair/bed transfer activity did not occur: Safety/medical concerns (not safe to get up)  Chair/bed transfer assist level: Moderate Assistance - Patient 50 - 74%     Locomotion Ambulation   Ambulation assist   Ambulation activity did not occur: Safety/medical concerns  Assist level: Dependent - Patient 0% Assistive device: Other (comment) (sara plus) Max distance: 18 steps   Walk 10 feet activity   Assist  Walk 10 feet activity did not occur: Safety/medical concerns        Walk 50 feet activity   Assist Walk 50 feet with 2 turns activity did not occur: Safety/medical concerns         Walk 150 feet activity   Assist Walk 150 feet activity did not occur: Safety/medical concerns         Walk 10 feet on uneven surface  activity   Assist Walk 10 feet on uneven surfaces activity did not  occur: Safety/medical concerns         Wheelchair     Assist Is the patient using a wheelchair?: Yes Type of Wheelchair: Power    Wheelchair assist level: Supervision/Verbal cueing Max wheelchair distance: 300+    Wheelchair 50 feet with 2 turns activity    Assist        Assist Level: Supervision/Verbal cueing   Wheelchair 150 feet activity     Assist      Assist Level: Supervision/Verbal cueing   Blood pressure 125/61, pulse 89, temperature (!) 97.1 F (36.2 C), resp. rate 14, height 5\' 9"  (1.753 m), weight 95.6 kg, SpO2 100%.   Medical Problem List and Plan: 1. Functional deficits secondary to debility related to septic shock/C. difficile colitis/AKI superimposed on CKD stage IV             -patient may shower with tubes/drains covered             -ELOS/Goals:  3/28, min assist PT/OT             -Continue CIR therapies including PT, OT   -Team meeting scheduled for Tuesday  -family/ team meeting  Tuesday at 1 PM.  -Daily therapy for now  -Family meeting completed yesterday  -Will call insurance peer to peer-called and scheduled 2 time periods for call back  -Exp DC 4/18 tomorrow   2.  Antithrombotics: -DVT/anticoagulation:  Pharmaceutical: Eliquis             -antiplatelet therapy: N/A   3. Pain Management: tramadol prn, Zanaflex 4 mg twice daily as needed   Biliateral OA of knees  -ordered bilateral neoprine knee sleeves --try today  -voltaren gel tid to knees  -have scheduled tramadol 50mg  at 0700 and 1200 daily. Continue prn as well  Mild adhesive capsulitis left shoulder---  sports cream. Can use voltaren gel also. Aggressive ROM with therapy and while in bed--continue  -Will check right knee x-ray, left knee x-ray on 3/8 with advanced OA.  Will consult orthopedics for cortisone injections  -3-28: Orthopedics aspirated and injected left knee, with improvement.  Ongoing pain with right knee.  3/31 Knee fluid calcium pyrophosphate cyrystals,  pseudogout?, cultures with no growth  -4/2 hydrocodone PRN started yesterday for flank pain  4/3 reconsult orthopedics, will come up and talk to him today about his right knee.  I had spoken with orthopedics on 4/1 are considering oral steroids due to crystals, pseudogout?  But held off due to more acute medical issues  4/4 right knee is feeling better continue to  monitor  4/5 continues to be improved although has not had therapy today  4/15 Knee pain continues with activity, continue tramadol    4. Mood/Behavior/Sleep: Trazodone 50 mg nightly as needed.  Provide emotional support             -antipsychotic agents: N/A   5. Neuropsych/cognition: This patient is capable of making decisions on his own behalf.   6. Skin/Wound Care: Routine skin checks   7. Fluids/Electrolytes/Nutrition:    -3/14 po intake remains poor, albumin low, weights falling  -will ask RD for assistance. Have spoken to patient about intake  -?IV albumin (see below)  3/18 discussed with dietitian, patient recorded is eating 0% of his meals.  Appears that wife bringing in 3 meals a day and he is eating about 75%, appears to have fairly adequate intake.  3/27 eating frequent outside foods-doesn't like hospital food as much  4/7 encouraged pt to eat any food he feels like. More important to just get calories in at this point  4/11 appetite is improving  8.  Oliguric AKI superimposed on CKD stage IV.  Baseline creatinine 4.9.  CRRT discontinued 2/25.  No current plan for long-term dialysis.  Follow-up renal services              - Per discussion with nephrology Dr. America Bake, keep Foley catheter in to allow accurate I's and O's through Monday; then, can remove and do DC Foley trial while continuing to document strict outputs             - Continue daily assessments of renal function per nephrology   3/8- per renal, Cr leveling out- and con't Foley- will remove Monday  -nephrology following, Cr remains in 4's.   3/14 renal  function with some improvement today     -suspect lower extremity edema is related to renal function/low albumin   Appreciate nephro input   -3/17 BUN/creatinine slightly improved 34/3.31, appreciate nephrology assistance  3/19 BUN/CR stable at 34/3.24, nephrology signing off, ok to check BMET twice a week  3/23 encouraging po this weekend. happy that he's eating food from home  3/24 BUN/CR overall stable at 40/3.26  -3/31 BUN/Cr a little higher today, IVF 70ml/hr  -4/1 Cr a little higher again, will contact nephrology  -4/3 nephrology note reviewed, IV Lasix discontinued and he was given some Lasix today, changed oral bicarb by nephrology, can give IV as needed  4/4 patient has a lot of edema, nephrology reducing Lasix today and holding IVF creatinine higher at 5.14  4/5 reviewed nephrology note, patient getting albumin and Lasix today  4/7 labs reviewed, similar to yesterday, ongoing mgt per nephro  4/8 Cr stable for past few days, CT ordered by nephrology to r/o other obstructive issue  4/9 discussed CT scan results with urology by phone, no interventions recommended at this time. Cr stable. Will await formal note for recommendations.   4/10 nephrology has ordered removal/replace foley, may need RRT-appears urology recommended to keep current Foley in place  4/16 Cr 5.78 today, continue to monitor   - Follow-up with nephrology in 2 to 3 weeks outpatient  9.  C. difficile colitis.  Completed course of oral vancomycin 3/7.  DC'd IV Flagyl 3/3. Enteric precautions   3/14 stools formed this morning   3/17 discussed with ID pharmacy, patient having more frequent bowel movements.  Monitor today and if continues consider Dificid treatment  3/18 diarrhea appears to be improving, continue to monitor for now  3/19  Frequent Bms improved, continue to follow  3/21 having more frequent bowel movements again today, continue to monitor and if frequent diarrhea persists this weekend consider treatment  with Dificid  3/23- mushy/lqiuid stool again overnight    -no dificid at this point. Discuss with GI first on Monday    -added fiber to regimen yesterday, will increase to bid today  3/24 mushy/ liquid BMs, about 2 a day yesterday, more other days- will discuss GI  3/25 GI recommended repeat C. difficile screening. Discussed with Arzella Laurence, hold off on repeat screening test, he only had about 2 bowel movements a day but if continues to have frequent BMs consider retreatment with oral vancomycin for 10 days  3/26 pt reports Bms more firm, continue to monitor   3/28 patient reports BMs have been more solid, continue to monitor.  If worsening diarrhea occurs consider retreatment with vancomycin oral for 10 days  3-29: Diarrhea this a.m., no bowel movements this afternoon recorded  3-30: No BM since yesterday.  3-31 diarrhea continues to be improved overall  4-3 LBM yesterday, reports diarrhea continues to be improved  -4/4 patient with increased diarrhea, discussed with hospitalist and ID.  Restarted oral vancomycin 125 mg 4 times a day for 10 days  -4/5 continue vancomycin oral, hospitalist following appreciate assistance  -4/6 discussed with ID today, will change to Dificid  -4/7 WBC's sl improved to 53k today. Continue fidaxomicin, fiber,etc   -appreciate ID f/u  4/8 WBC a little better today 52.1, continue to monitor. Still having frequent Bms  4/9 WBC improved to 40.4, continue to monitor. Diarrhea a little better  4/10 WBC decreased 27.8 Much improved, continue current regimen  4/14 diarrhea improved  4/15 DC dificit today- completed 10 days  4/17 Only 1 nonliquid bowel movement today 10.  Status post left nephrostomy tube d/t staghorn calculi. Has Foley Nephrostomy tube placed 1/29 per IR with plan for ureteric stent placement 3/21              - Careful monitoring of nephrostomy output  -3/14 flomax has been added to improve emptying  -3/21 patient scheduled today for cystoscopy  with left retrograde pyelogram, left ureteroscopy laser lithotripsy and left ureteral stent placement.     3/22- procedure appears successful thus far.    -urine clear, patient comfortable   -foley to come out on Monday per urology  3/25 Foley was removed patient continues to have urinary retention requiring IC.  Increase Flomax to 0.8 mg  3/26 intermittent blood noted with IC, if continues will restart foley  3/27 Urine no longer blood tinged, continue current regimen, consider restart foley if reoccurs  3/27 continues to require catheterization but no longer blood-tinged Addendum 3/28 foley started after traumatic cath, possible false passage, continue foley for now, f/u with urology outpatient 4/1 foley with hematuria, denies known trauma to this, will ask nursing to check bladder scan. U/A culture ordered.  Called his urologist to discuss 4/2 CT abdomen/pelvis-bilateral obstructive uropathy with collecting system hematuria, large calculus in right ureter and pelvic inlet, new left hydronephrosis, new anasarca, liver nodules raising possibility of cirrhosis.  Discussed patient with urology - surgical procedure today  Addendum discussed with urology, will hold eliquis for now due to bleeding, check EKG- A flutter -4/3 cystoscopy, right retrograde pyelogram, right ureteral stent insertion yesterday by Dr. Willye Harvey, further procedure scheduled on 4/15 with Dr. Derrick Fling.  Discussed with [facility] urology.  Do not think traumatic cath last week caused recurrent  bleeding his kidney, his wife was concerned about this.  Patient was having nonbloody urine for several days on the Foley.  Will continue Foley and not attempt intermittent cath.  Patient suspected to have false passage. 4/4 urine appears less dark today, urology considering TXA tomorrow if bleeding is not resolved, Eliquis on hold 4/5 appreciate urology assistance, bleeding does appear to be improving, only blood-tinged urine in Foley  tubing.  Consider restart Eliquis when 4/6 Discussed with urology,  bleeding improving, hold off on TXA, continue to hold eliquis for today due to high risk of bleeding 4/7- urine appears to be clearing although hgb down to 8.7 today  -continue serial labs  -don' think TXA is indicated right now  -would keep eliquis on hold 4/8 Continue to hold eliques, could consider heparin trail- discuss with hospitalist- CT scan results before considering 4/9 Will check EKG to see if still having afib/flutter, consider heparin infusion, will hold off today as urine looks a little pink/blood tinged 4/10 Dark urine today, Pt was seen by urology, continue to monitor 4/11 Urine less dark today, continue to monitor 4/16 s/p urethroscopy/laser lithotripsy b/l yesterday by Dr. Mena Goes  Follow-up with urology next week  11.  Chronic atrial fibrillation with episodes of bradycardia.  Followed by cardiology services.  Continue Eliquis.  Avoid AV nodal blocking agents -4/11 cardiology consulted appreciate assistance, rate control.  Continue to hold anticoagulation for now.  Cardiology may consider left atrial appendage occlusion device after discharge.   12.  Diabetes mellitus.  Hemoglobin A1c 6.2.    -tightly controlled. Dc SSI and change cbg checks to qam only   CBG (last 3)  Recent Labs    08/19/23 0834 08/19/23 2037 08/20/23 0545  GLUCAP 107* 155* 103*       3-30: Recently increased.  Somewhat expected with intra-articular steroid injection.  Continue to trend.  4/5-7 controlled, continue to monitor   controlled, continue to monitor   13.  Hypotension.  continue midodrine to 5mg  TID PRN orthostatic hypotension       08/20/2023    2:00 PM 08/20/2023    7:14 AM 08/20/2023    4:50 AM  Vitals with BMI  Weight  210 lbs 12 oz   BMI  31.11   Systolic 125  108  Diastolic 61  80  Pulse 89  67    14.  Chronic anemia.  continue Niferex daily/Aranesp.  Monitor for any bleeding episodes   3/8- Hb 7.1  today- will order transfusion- transfusion of 1 unit pRBCs.   3/14 hgb up to 9.1  -3/17 hemoglobin down to 8.4, continue to monitor  -3/19 HGB 8.3 today, stable overall, continue to monitor    -3/27 HGB 7.7- a little lower likely due to GU procedure/hematuria, continue to monitor  3/31 HGB 7.6 today, transfuse 7 or less  4/7 hgb down to 8.7. hematuria better--continue to monitor serially   4/15 overall stable at 7.9  16. Leukocytosis  3/9- Pt's WBC is 21.7- up from 16k- is afebrile- due to his recent C diff, that just finished correct dosing of ABX for, called ID for guidance on treatment- don't want to cause more Cdiff- ID consulted ---no new orders  3/10 WBC's down to 17k  --have been hovering above and below 20 for awhile. No new clinical symptoms. Nephrostomy drainage clear, yellow  3/14 wbc's down to 13k.--recheck Monday   3/17 WBC is down to 12.4  3/19 WBC 13.4 today, continue to monitor  3/24 WBC  higher today 18.7, Discuss C diff treatment with GI a above 3/31 WBC still elevated 19.0, monitor for signs of infection 4/3 WBC still elevated to 23.8, potentially due to his urological issues and procedure.  Hospitalist also following appreciate assistance  4/4 WBC higher today, oral vancomycin started as above.  Discussed with ID Lalla Pill and hospitalist.  Patient had Pseudomonas resulting in prior urine culture.  Recommended cefepime for approximately 10 days depending on WBC.  Called pharmacy for cefepime dosing.  ID recommended replacing Foley due to yeast on last culture, discussed with urology who indicates this was already done so hold off on changing again.  4/5 continue current antibiotic regimen, hospitalist team following.  WBC higher today-discussed with hospitalist yesterday, often see higher WBCs with C. difficile.  Continue monitor trend daily.  Patient remains afebrile and vital stable  4/7 WBC's 53k, sl improvement   -continue fidaxomicin   -appreciate ID  help   -serial labs  4/10 WBC improved to 27  4/15 down to 17.7 today    17. Decreased mood reported by therapy  -Consider SSRI  -3/20 patient reports his mood is okay, denies depression this morning.  continue to monitor  18. Insomnia   continue trazodone  19. LE edema  -Significantly improved on the left after steroid injection and effusion tap.  -IVF discontinued as above  - Nephrology following, treating with Lasix, albumin  -4/7 edema with some improvement. albumin /lasix per nephro   -elevate/compression   -improve nutrition Weight reviewed and is stable  20. Low MG and potassium  -4/8 mg and K+ repletion today  -4/9 addition K+ was ordered  4/11 K+ stable 3.6, MG 2.0  4/15 Replete - discussed dose with pharmacy. Twitching may be related to low K+  4/17 K+ stable 4.2    Mobility Assessment  Wrangler Penning was seen for the purpose of a mobility assessment for a powered wheelchair. I have reviewed and agree with the detailed PT evaluation. Ambrosio Junker Alyea suffers from chronic weakness, joint pain  related to DM, CKD, Knee OA, A fib. Due to joint pain, decreased endurance and obesity he is unable to utilize a cane, walker, manual wheelchair, or scooter. The patient is appropriate for a customized power wheelchair.  With a power wheelchair  Paul Backlund can move independently at a household level and on a limited basis in the community. The chair will also allow the patient to perform ADL's more easily. The patient is competent to operate the recommended chair on his own and is motivated to utilize the chair on a daily basis.    LOS: 41 days A FACE TO FACE EVALUATION WAS PERFORMED  Lylia Sand 08/20/2023, 5:58 PM

## 2023-08-20 NOTE — Progress Notes (Signed)
 Inpatient Rehabilitation Discharge Medication Review by a Pharmacist  A complete drug regimen review was completed for this patient to identify any potential clinically significant medication issues.  High Risk Drug Classes Is patient taking? Indication by Medication  Antipsychotic No   Anticoagulant No   Antibiotic No   Opioid Yes Vicodin, tramadol - acute pain  Antiplatelet No   Hypoglycemics/insulin No   Vasoactive Medication Yes Midodrine - blood pressure  Chemotherapy No   Other Yes Multivitamin - supplement Bicarb tabs- CKD  APAP- pain  Tamsulosin- BPH  Trazodone- sleep Niferex - iron supplement Tizanidine- muscle spasms     Type of Medication Issue Identified Description of Issue Recommendation(s)  Drug Interaction(s) (clinically significant)     Duplicate Therapy     Allergy     No Medication Administration End Date     Incorrect Dose     Additional Drug Therapy Needed     Significant med changes from prior encounter (inform family/care partners about these prior to discharge). PTA medications:   Amlodipine,  Levemir insulin, olmesartan were discontinued on discharge from acute care. Eliquis discontinued for recurrent bleeding from kidney.   Lanthanum and sevelamer patient reported as NOT taking prior to acute admit and were discontinued on the acute admission discharge orders.  Restart PTA meds when and if necessary during CIR admission or at time of discharge, if warranted   Other       Clinically significant medication issues were identified that warrant physician communication and completion of prescribed/recommended actions by midnight of the next day:  No  Name of provider notified for urgent issues identified:   Provider Method of Notification:     Pharmacist comments:   Time spent performing this drug regimen review (minutes):  25   Chrystie Crass, PharmD Clinical Pharmacist  08/20/2023 7:21 AM

## 2023-08-20 NOTE — Plan of Care (Signed)
  Problem: RH Bed Mobility Goal: LTG Patient will perform bed mobility with assist (PT) Description: LTG: Patient will perform bed mobility with assistance, with/without cues (PT). Outcome: Adequate for Discharge   Problem: RH Car Transfers Goal: LTG Patient will perform car transfers with assist (PT) Description: LTG: Patient will perform car transfers with assistance (PT). Outcome: Adequate for Discharge   Problem: RH Balance Goal: LTG Patient will maintain dynamic sitting balance (PT) Description: LTG:  Patient will maintain dynamic sitting balance with assistance during mobility activities (PT) Outcome: Completed/Met   Problem: RH Bed to Chair Transfers Goal: LTG Patient will perform bed/chair transfers w/assist (PT) Description: LTG: Patient will perform bed to chair transfers with assistance (PT). Outcome: Completed/Met   Problem: RH Wheelchair Mobility Goal: LTG Patient will propel w/c in controlled environment (PT) Description: LTG: Patient will propel wheelchair in controlled environment, # of feet with assist (PT) Outcome: Completed/Met Goal: LTG Patient will propel w/c in home environment (PT) Description: LTG: Patient will propel wheelchair in home environment, # of feet with assistance (PT). Outcome: Completed/Met

## 2023-08-21 DIAGNOSIS — D62 Acute posthemorrhagic anemia: Secondary | ICD-10-CM

## 2023-08-21 LAB — RENAL FUNCTION PANEL
Albumin: 1.9 g/dL — ABNORMAL LOW (ref 3.5–5.0)
Anion gap: 11 (ref 5–15)
BUN: 64 mg/dL — ABNORMAL HIGH (ref 8–23)
CO2: 11 mmol/L — ABNORMAL LOW (ref 22–32)
Calcium: 7.8 mg/dL — ABNORMAL LOW (ref 8.9–10.3)
Chloride: 114 mmol/L — ABNORMAL HIGH (ref 98–111)
Creatinine, Ser: 5.67 mg/dL — ABNORMAL HIGH (ref 0.61–1.24)
GFR, Estimated: 10 mL/min — ABNORMAL LOW (ref >60)
Glucose, Bld: 103 mg/dL — ABNORMAL HIGH (ref 70–99)
Phosphorus: 6.7 mg/dL — ABNORMAL HIGH (ref 2.5–4.6)
Potassium: 3.9 mmol/L (ref 3.5–5.1)
Sodium: 136 mmol/L (ref 135–145)

## 2023-08-21 LAB — GLUCOSE, CAPILLARY: Glucose-Capillary: 108 mg/dL — ABNORMAL HIGH (ref 70–99)

## 2023-08-21 NOTE — Progress Notes (Addendum)
 PROGRESS NOTE   Subjective/Complaints: Patient looking forward to going home.  Discussed follow-up appointments outpatient with multiple specialties.  No additional concerns.  ROS: Patient denies fever, chills, headache, shortness of breath, chest pain, nausea, vomiting + Knee pain-doing okay today + Diarrhea - improved + poor appetite -improved     Objective:   No results found.       Recent Labs    08/18/23 1351 08/20/23 0518  WBC  --  21.6*  HGB 9.9* 8.0*  HCT 29.0* 26.0*  PLT  --  243     Recent Labs    08/20/23 0518 08/21/23 0533  NA 137 136  K 3.5 3.9  CL 114* 114*  CO2 12* 11*  GLUCOSE 105* 103*  BUN 66* 64*  CREATININE 5.92* 5.67*  CALCIUM  8.1* 7.8*      Intake/Output Summary (Last 24 hours) at 08/21/2023 1240 Last data filed at 08/21/2023 0900 Gross per 24 hour  Intake 480 ml  Output 1070 ml  Net -590 ml         Physical Exam: Vital Signs Blood pressure 115/61, pulse 68, temperature 97.6 F (36.4 C), resp. rate 20, height 5\' 9"  (1.753 m), weight 95.6 kg, SpO2 100%.  Constitutional: No distress . Vital signs reviewed.  Laying in bed HEENT: NCAT, EOMI, oral membranes moist Neck: supple Cardiovascular: RRR without murmur. No JVD    Respiratory/Chest: CTA Bilaterally without wheezes or rales. Normal effort    GI/Abdomen: BS +, , non-distended, non-tender Ext:  trace LE edema Psych: pleasant and cooperative  GU: Foley draining yellow urine today    Skin: No evidence of breakdown, no evidence of rash Neurologic: Cranial nerves II through XII grossly intact, motor strength is moving all 4 extremities to gravity and resistance   MSK: Bilateral knee effusions ongoing.  Not wearing knees leaves this morning  stable exam 4/18     Assessment/Plan: 1. Functional deficits which require 3+ hours per day of interdisciplinary therapy in a comprehensive inpatient rehab  setting. Physiatrist is providing close team supervision and 24 hour management of active medical problems listed below. Physiatrist and rehab team continue to assess barriers to discharge/monitor patient progress toward functional and medical goals  Care Tool:  Bathing  Bathing activity did not occur:  (patient completed a simulated task at bed LOF) Body parts bathed by patient: Right arm, Left arm, Chest, Abdomen, Front perineal area, Right upper leg, Left upper leg, Face   Body parts bathed by helper: Buttocks, Left lower leg, Right lower leg     Bathing assist Assist Level: Maximal Assistance - Patient 24 - 49%     Upper Body Dressing/Undressing Upper body dressing   What is the patient wearing?: Pull over shirt    Upper body assist Assist Level: Minimal Assistance - Patient > 75%    Lower Body Dressing/Undressing Lower body dressing      What is the patient wearing?: Pants, Incontinence brief     Lower body assist Assist for lower body dressing: Total Assistance - Patient < 25%     Toileting Toileting    Toileting assist Assist for toileting: Total Assistance - Patient < 25%  Transfers Chair/bed transfer  Transfers assist  Chair/bed transfer activity did not occur: Safety/medical concerns (not safe to get up)  Chair/bed transfer assist level: Moderate Assistance - Patient 50 - 74%     Locomotion Ambulation   Ambulation assist   Ambulation activity did not occur: Safety/medical concerns  Assist level: Dependent - Patient 0% Assistive device: Other (comment) (sara plus) Max distance: 18 steps   Walk 10 feet activity   Assist  Walk 10 feet activity did not occur: Safety/medical concerns        Walk 50 feet activity   Assist Walk 50 feet with 2 turns activity did not occur: Safety/medical concerns         Walk 150 feet activity   Assist Walk 150 feet activity did not occur: Safety/medical concerns         Walk 10 feet on uneven  surface  activity   Assist Walk 10 feet on uneven surfaces activity did not occur: Safety/medical concerns         Wheelchair     Assist Is the patient using a wheelchair?: Yes Type of Wheelchair: Power    Wheelchair assist level: Supervision/Verbal cueing Max wheelchair distance: 300+    Wheelchair 50 feet with 2 turns activity    Assist        Assist Level: Supervision/Verbal cueing   Wheelchair 150 feet activity     Assist      Assist Level: Supervision/Verbal cueing   Blood pressure 115/61, pulse 68, temperature 97.6 F (36.4 C), resp. rate 20, height 5\' 9"  (1.753 m), weight 95.6 kg, SpO2 100%.   Medical Problem List and Plan: 1. Functional deficits secondary to debility related to septic shock/C. difficile colitis/AKI superimposed on CKD stage IV             -patient may shower with tubes/drains covered             -ELOS/Goals:  3/28, min assist PT/OT             -Continue CIR therapies including PT, OT   -Team meeting scheduled for Tuesday  -family/ team meeting  Tuesday at 1 PM.  -Daily therapy for now  -Family meeting completed yesterday  -Will call insurance peer to peer-called and scheduled 2 time periods for call back  - Discharge home today   2.  Antithrombotics: -DVT/anticoagulation:  Pharmaceutical: Eliquis              -antiplatelet therapy: N/A   3. Pain Management: tramadol  prn, Zanaflex  4 mg twice daily as needed   Biliateral OA of knees  -ordered bilateral neoprine knee sleeves --try today  -voltaren  gel tid to knees  -have scheduled tramadol  50mg  at 0700 and 1200 daily. Continue prn as well  Mild adhesive capsulitis left shoulder---  sports cream. Can use voltaren  gel also. Aggressive ROM with therapy and while in bed--continue  -Will check right knee x-ray, left knee x-ray on 3/8 with advanced OA.  Will consult orthopedics for cortisone injections  -3-28: Orthopedics aspirated and injected left knee, with improvement.   Ongoing pain with right knee.  3/31 Knee fluid calcium  pyrophosphate cyrystals, pseudogout?, cultures with no growth  -4/2 hydrocodone  PRN started yesterday for flank pain  4/3 reconsult orthopedics, will come up and talk to him today about his right knee.  I had spoken with orthopedics on 4/1 are considering oral steroids due to crystals, pseudogout?  But held off due to more acute medical issues  4/4 right knee is  feeling better continue to monitor  4/5 continues to be improved although has not had therapy today  4/15 Knee pain continues with activity, continue tramadol     4. Mood/Behavior/Sleep: Trazodone  50 mg nightly as needed.  Provide emotional support             -antipsychotic agents: N/A   5. Neuropsych/cognition: This patient is capable of making decisions on his own behalf.   6. Skin/Wound Care: Routine skin checks   7. Fluids/Electrolytes/Nutrition:    -3/14 po intake remains poor, albumin  low, weights falling  -will ask RD for assistance. Have spoken to patient about intake  -?IV albumin  (see below)  3/18 discussed with dietitian, patient recorded is eating 0% of his meals.  Appears that wife bringing in 3 meals a day and he is eating about 75%, appears to have fairly adequate intake.  3/27 eating frequent outside foods-doesn't like hospital food as much  4/7 encouraged pt to eat any food he feels like. More important to just get calories in at this point  4/11 appetite is improving  8.  Oliguric AKI superimposed on CKD stage IV.  Baseline creatinine 4.9.  CRRT discontinued 2/25.  No current plan for long-term dialysis.  Follow-up renal services              - Per discussion with nephrology Dr. America Bake, keep Foley catheter in to allow accurate I's and O's through Monday; then, can remove and do DC Foley trial while continuing to document strict outputs             - Continue daily assessments of renal function per nephrology   3/8- per renal, Cr leveling out- and con't  Foley- will remove Monday  -nephrology following, Cr remains in 4's.   3/14 renal function with some improvement today     -suspect lower extremity edema is related to renal function/low albumin    Appreciate nephro input   -3/17 BUN/creatinine slightly improved 34/3.31, appreciate nephrology assistance  3/19 BUN/CR stable at 34/3.24, nephrology signing off, ok to check BMET twice a week  3/23 encouraging po this weekend. happy that he's eating food from home  3/24 BUN/CR overall stable at 40/3.26  -3/31 BUN/Cr a little higher today, IVF 41ml/hr  -4/1 Cr a little higher again, will contact nephrology  -4/3 nephrology note reviewed, IV Lasix  discontinued and he was given some Lasix  today, changed oral bicarb by nephrology, can give IV as needed  4/4 patient has a lot of edema, nephrology reducing Lasix  today and holding IVF creatinine higher at 5.14  4/5 reviewed nephrology note, patient getting albumin  and Lasix  today  4/7 labs reviewed, similar to yesterday, ongoing mgt per nephro  4/8 Cr stable for past few days, CT ordered by nephrology to r/o other obstructive issue  4/9 discussed CT scan results with urology by phone, no interventions recommended at this time. Cr stable. Will await formal note for recommendations.   4/10 nephrology has ordered removal/replace foley, may need RRT-appears urology recommended to keep current Foley in place  4/16 Cr 5.78 today, continue to monitor   - Follow-up with nephrology in 2 to 3 weeks outpatient-reviewed this with patient this morning  9.  C. difficile colitis.  Completed course of oral vancomycin  3/7.  DC'd IV Flagyl  3/3. Enteric precautions   3/14 stools formed this morning   3/17 discussed with ID pharmacy, patient having more frequent bowel movements.  Monitor today and if continues consider Dificid  treatment  3/18 diarrhea appears to  be improving, continue to monitor for now  3/19 Frequent Bms improved, continue to follow  3/21 having more  frequent bowel movements again today, continue to monitor and if frequent diarrhea persists this weekend consider treatment with Dificid   3/23- mushy/lqiuid stool again overnight    -no dificid  at this point. Discuss with GI first on Monday    -added fiber to regimen yesterday, will increase to bid today  3/24 mushy/ liquid BMs, about 2 a day yesterday, more other days- will discuss GI  3/25 GI recommended repeat C. difficile screening. Discussed with Arzella Laurence, hold off on repeat screening test, he only had about 2 bowel movements a day but if continues to have frequent BMs consider retreatment with oral vancomycin  for 10 days  3/26 pt reports Bms more firm, continue to monitor   3/28 patient reports BMs have been more solid, continue to monitor.  If worsening diarrhea occurs consider retreatment with vancomycin  oral for 10 days  3-29: Diarrhea this a.m., no bowel movements this afternoon recorded  3-30: No BM since yesterday.  3-31 diarrhea continues to be improved overall  4-3 LBM yesterday, reports diarrhea continues to be improved  -4/4 patient with increased diarrhea, discussed with hospitalist and ID.  Restarted oral vancomycin  125 mg 4 times a day for 10 days  -4/5 continue vancomycin  oral, hospitalist following appreciate assistance  -4/6 discussed with ID today, will change to Dificid   -4/7 WBC's sl improved to 53k today. Continue fidaxomicin , fiber,etc   -appreciate ID f/u  4/8 WBC a little better today 52.1, continue to monitor. Still having frequent Bms  4/9 WBC improved to 40.4, continue to monitor. Diarrhea a little better  4/10 WBC decreased 27.8 Much improved, continue current regimen  4/14 diarrhea improved  4/15 DC dificit today- completed 10 days  4/18 remains improved, return to ER if begins to have significantly worsened watery diarrhea again 10.  Status post left nephrostomy tube d/t staghorn calculi. Has Foley Nephrostomy tube placed 1/29 per IR with plan for  ureteric stent placement 3/21              - Careful monitoring of nephrostomy output  -3/14 flomax  has been added to improve emptying  -3/21 patient scheduled today for cystoscopy with left retrograde pyelogram, left ureteroscopy laser lithotripsy and left ureteral stent placement.     3/22- procedure appears successful thus far.    -urine clear, patient comfortable   -foley to come out on Monday per urology  3/25 Foley was removed patient continues to have urinary retention requiring IC.  Increase Flomax  to 0.8 mg  3/26 intermittent blood noted with IC, if continues will restart foley  3/27 Urine no longer blood tinged, continue current regimen, consider restart foley if reoccurs  3/27 continues to require catheterization but no longer blood-tinged Addendum 3/28 foley started after traumatic cath, possible false passage, continue foley for now, f/u with urology outpatient 4/1 foley with hematuria, denies known trauma to this, will ask nursing to check bladder scan. U/A culture ordered.  Called his urologist to discuss 4/2 CT abdomen/pelvis-bilateral obstructive uropathy with collecting system hematuria, large calculus in right ureter and pelvic inlet, new left hydronephrosis, new anasarca, liver nodules raising possibility of cirrhosis.  Discussed patient with urology - surgical procedure today  Addendum discussed with urology, will hold eliquis  for now due to bleeding, check EKG- A flutter -4/3 cystoscopy, right retrograde pyelogram, right ureteral stent insertion yesterday by Dr. Willye Harvey, further procedure scheduled on 4/15 with Dr.  Eskridge.  Discussed with [facility] urology.  Do not think traumatic cath last week caused recurrent bleeding his kidney, his wife was concerned about this.  Patient was having nonbloody urine for several days on the Foley.  Will continue Foley and not attempt intermittent cath.  Patient suspected to have false passage. 4/4 urine appears less dark today,  urology considering TXA tomorrow if bleeding is not resolved, Eliquis  on hold 4/5 appreciate urology assistance, bleeding does appear to be improving, only blood-tinged urine in Foley tubing.  Consider restart Eliquis  when 4/6 Discussed with urology,  bleeding improving, hold off on TXA, continue to hold eliquis  for today due to high risk of bleeding 4/7- urine appears to be clearing although hgb down to 8.7 today  -continue serial labs  -don' think TXA is indicated right now  -would keep eliquis  on hold 4/8 Continue to hold eliques, could consider heparin  trail- discuss with hospitalist- CT scan results before considering 4/9 Will check EKG to see if still having afib/flutter, consider heparin  infusion, will hold off today as urine looks a little pink/blood tinged 4/10 Dark urine today, Pt was seen by urology, continue to monitor 4/11 Urine less dark today, continue to monitor 4/17 s/p urethroscopy/laser lithotripsy b/l yesterday by Dr. Derrick Fling  Follow-up with urology next week 4/18 reviewed follow-up with urology, discussed they may consider trying to remove Foley at that time  11.  Chronic atrial fibrillation with episodes of bradycardia.  Followed by cardiology services.  Continue Eliquis .  Avoid AV nodal blocking agents -4/11 cardiology consulted appreciate assistance, rate control.  Continue to hold anticoagulation for now.  Cardiology may consider left atrial appendage occlusion device after discharge. -4/18 discussed follow-up with cardiology/PCP, consider restart Eliquis  if bleeding remains improved   12.  Diabetes mellitus.  Hemoglobin A1c 6.2.    -tightly controlled. Dc SSI and change cbg checks to qam only   CBG (last 3)  Recent Labs    08/19/23 2037 08/20/23 0545 08/21/23 0622  GLUCAP 155* 103* 108*       3-30: Recently increased.  Somewhat expected with intra-articular steroid injection.  Continue to trend.  4/5-7 controlled, continue to monitor   controlled, continue  to monitor   13.  Hypotension.  continue midodrine  to 5mg  TID PRN orthostatic hypotension       08/20/2023    7:20 PM 08/20/2023    2:00 PM 08/20/2023    7:14 AM  Vitals with BMI  Weight   210 lbs 12 oz  BMI   31.11  Systolic 115 125   Diastolic 61 61   Pulse 68 89     14.  Chronic anemia.  continue Niferex daily/Aranesp .  Monitor for any bleeding episodes   3/8- Hb 7.1 today- will order transfusion- transfusion of 1 unit pRBCs.   3/14 hgb up to 9.1  -3/17 hemoglobin down to 8.4, continue to monitor  -3/19 HGB 8.3 today, stable overall, continue to monitor    -3/27 HGB 7.7- a little lower likely due to GU procedure/hematuria, continue to monitor  3/31 HGB 7.6 today, transfuse 7 or less  4/7 hgb down to 8.7. hematuria better--continue to monitor serially   4/17 stable at 8.0  16. Leukocytosis  3/9- Pt's WBC is 21.7- up from 16k- is afebrile- due to his recent C diff, that just finished correct dosing of ABX for, called ID for guidance on treatment- don't want to cause more Cdiff- ID consulted ---no new orders  3/10 WBC's down to 17k  --  have been hovering above and below 20 for awhile. No new clinical symptoms. Nephrostomy drainage clear, yellow  3/14 wbc's down to 13k.--recheck Monday   3/17 WBC is down to 12.4  3/19 WBC 13.4 today, continue to monitor  3/24 WBC higher today 18.7, Discuss C diff treatment with GI a above 3/31 WBC still elevated 19.0, monitor for signs of infection 4/3 WBC still elevated to 23.8, potentially due to his urological issues and procedure.  Hospitalist also following appreciate assistance  4/4 WBC higher today, oral vancomycin  started as above.  Discussed with ID Lalla Pill and hospitalist.  Patient had Pseudomonas resulting in prior urine culture.  Recommended cefepime  for approximately 10 days depending on WBC.  Called pharmacy for cefepime  dosing.  ID recommended replacing Foley due to yeast on last culture, discussed with urology who indicates  this was already done so hold off on changing again.  4/5 continue current antibiotic regimen, hospitalist team following.  WBC higher today-discussed with hospitalist yesterday, often see higher WBCs with C. difficile.  Continue monitor trend daily.  Patient remains afebrile and vital stable  4/7 WBC's 53k, sl improvement   -continue fidaxomicin    -appreciate ID help   -serial labs  4/10 WBC improved to 27  4/15 down to 17.7 today  Follow-up with PCP for recheck  17. Decreased mood reported by therapy  -Consider SSRI  -3/20 patient reports his mood is okay, denies depression this morning.  continue to monitor  18. Insomnia   continue trazodone   19. LE edema  -Significantly improved on the left after steroid injection and effusion tap.  -IVF discontinued as above  - Nephrology following, treating with Lasix , albumin   -4/7 edema with some improvement. albumin  /lasix  per nephro   -elevate/compression   -improve nutrition Weight reviewed and is stable  20. Low MG and potassium  -4/8 mg and K+ repletion today  -4/9 addition K+ was ordered  4/11 K+ stable 3.6, MG 2.0  4/15 Replete - discussed dose with pharmacy. Twitching may be related to low K+  4/18 stable at 3.9    Mobility Assessment  Hussain Maimone was seen for the purpose of a mobility assessment for a powered wheelchair. I have reviewed and agree with the detailed PT evaluation. Ambrosio Junker Cotroneo suffers from chronic weakness, joint pain  related to DM, CKD, Knee OA, A fib. Due to joint pain, decreased endurance and obesity he is unable to utilize a cane, walker, manual wheelchair, or scooter. The patient is appropriate for a customized power wheelchair.  With a power wheelchair  Rajesh Caris can move independently at a household level and on a limited basis in the community. The chair will also allow the patient to perform ADL's more easily. The patient is competent to operate the recommended chair on his own and is  motivated to utilize the chair on a daily basis.    LOS: 42 days A FACE TO FACE EVALUATION WAS PERFORMED  Lylia Sand 08/21/2023, 12:40 PM

## 2023-08-21 NOTE — Progress Notes (Signed)
 PA reviewed discharge instructions with patient and wife. Meds received from TOC. Patient discharged left via PTAR to go home.   Randeen Busman, LPN

## 2023-08-21 NOTE — Progress Notes (Signed)
 Inpatient Rehabilitation Care Coordinator Discharge Note   Patient Details  Name: Jahmari Esbenshade MRN: [id number] Date of Birth: 1950-09-20   Discharge location: HOME WITH WIFE WHO CAN PROVIDE 24/7 CARE  Length of Stay: 5 DAYS  Discharge activity level: MIN-MOD WHEELCHAIR LEVEL  Home/community participation: ACTIVE  Patient response WU:[id number] Literacy - How often do you need to have someone help you when you read instructions, pamphlets, or other written material from your doctor or pharmacy?: Never  Patient response YN:WGNFAO Isolation - How often do you feel lonely or isolated from those around you?: Rarely  Services provided included: MD, RD, PT, OT, RN, CM, TR, Pharmacy, Neuropsych, SW  Financial Services:  Field seismologist Utilized: Physiological scientist  Choices offered to/list presented to: PT AND WIFE  Follow-up services arranged:  Home Health, DME, Patient/Family has no preference for HH/DME agencies Home Health Agency: [facility]  PT  OT  RN    DME : [facility] MOTION POWER CHAIR AND SLIDE BOARD  [facility]   VAN TRANSPORTATION LIST GIVEN TO WIFE ALONG WITH PRIVATE DUTY LIST  Patient response to transportation need: Is the patient able to respond to transportation needs?: Yes In the past 12 months, has lack of transportation kept you from medical appointments or from getting medications?: No In the past 12 months, has lack of transportation kept you from meetings, work, or from getting things needed for daily living?: No   Patient/Family verbalized understanding of follow-up arrangements:  Yes  Individual responsible for coordination of the follow-up plan: SYLVIA-[phone]  Confirmed correct DME delivered: Mardell Shade 08/21/2023    Comments (or additional information):WIFE WAS HERE DAILY TO DO HANDS ON CARE AND FEELS COMFORTABLE WITH CARE. PT IS MUCH CARE AND HOYER LIFT RECOMMENDED WIFE DECLINED IT.   Summary of Stay    Date/Time  Discharge Planning CSW  08/19/23 1308 Wife has been here and doing hands on care and transfers with slide board. Pt had procedure yesterday and hopefully has not back slid will see today. DME in place and [facility] arranged working toward DC Friday 4/18 RGD  08/12/23 0957 Family conference held yersterday to discuss plan and moving forward with hands on education with wife with hoyer lift. Procedure 4/15. MD still following medical issues. RGD  08/05/23 6578 Wife has been here and done hands on education quite a bit of care and now goals have been downgraded. Discussion regarding home versus SNF RGD  07/29/23 4696 Wife here daily and supportive, pt still requires much care. Will need to see if wife can provide the care he requires at DC RGD  07/22/23 0854 Pt making progress slowly-limited by knees and shoulder issues. Wife wants to take home but needs to be able to provide the care he will need. Will need extension if this is a possibility RGD  07/15/23 0940 HOme with wife who is taking a FMLA from her job, she has knee issues also. She is here daily hopefully she can provide the care he will need at DC RGD       Melvinia Ashby, Maralee Senate

## 2023-08-21 NOTE — Anesthesia Postprocedure Evaluation (Signed)
 Anesthesia Post Note  Patient: Calvin Schultz  Procedure(s) Performed: CYSTOSCOPY/URETEROSCOPY/HOLMIUM LASER/STENT PLACEMENT (Bilateral: Ureter)     Patient location during evaluation: PACU Anesthesia Type: General Level of consciousness: awake and alert Pain management: pain level controlled Vital Signs Assessment: post-procedure vital signs reviewed and stable Respiratory status: spontaneous breathing, nonlabored ventilation and respiratory function stable Cardiovascular status: blood pressure returned to baseline and stable Postop Assessment: no apparent nausea or vomiting Anesthetic complications: no   No notable events documented.                 Tynleigh Birt

## 2023-08-27 ENCOUNTER — Telehealth: Payer: Self-pay | Admitting: Physical Medicine & Rehabilitation

## 2023-08-27 MED ORDER — TAMSULOSIN HCL 0.4 MG PO CAPS: 0.8000 mg | ORAL_CAPSULE | Freq: Every day | ORAL | 0 refills | Status: DC

## 2023-08-27 NOTE — Telephone Encounter (Signed)
 Patient needs refills on flomax , midodrine  and trasodone.  Patient not able to get an appointment with primary doctor until next Friday and they will not refill these medications.  [facility] [address] [facility]

## 2023-08-28 MED ORDER — TRAZODONE HCL 150 MG PO TABS: 75.0000 mg | ORAL_TABLET | Freq: Every evening | ORAL | Status: DC | PRN

## 2023-09-24 ENCOUNTER — Telehealth: Payer: Self-pay | Admitting: Cardiology

## 2023-09-24 NOTE — Telephone Encounter (Signed)
 Spoke with pt and he stated that he is having swelling in his legs and testicles but no shortness of breath or any other symptoms currently. I see a phone note in epic where he contacted his PCP as well but they haven't contacted him back. His nephrologist had suggested the pt asking about being placed back on Lasix  d/t that helping in the past. Explained to pt that Dr. Albert Huff is out of the office until next week. Unable to reach DOD before they left the office this evening. Will forward to Dr. Albert Huff for further advisement. Advised pt to follow recommendations from PCP if he hears back from their office first.

## 2023-09-24 NOTE — Telephone Encounter (Signed)
 Pt c/o swelling/edema: STAT if pt has developed SOB within 24 hours  If swelling, where is the swelling located? Leg, arm,groin  How much weight have you gained and in what time span? no  Have you gained 2 pounds in a day or 5 pounds in a week? no  Do you have a log of your daily weights (if so, list)? no  Are you currently taking a fluid pill? no  Are you currently SOB? No   Have you traveled recently in a car or plane for an extended period of time? no

## 2023-09-25 NOTE — Telephone Encounter (Signed)
 Kristie with [facility] following up. Would like to know if someone can provide feedback before EOD. She mentions +1 pitting edema in feet.

## 2023-09-25 NOTE — Telephone Encounter (Signed)
 Spoke with Kristie [facility] RN) and explained that this was discussed with Dr. Berry Bristol (DOD) today who stated that because the pt has not been seen in our practice since 2023, defer to PCP for this specific prescription request. Calvin Schultz verbalized understanding of plan and asked that we call the pt's wife to discuss.  Spoke with Calvin Schultz (pt's wife) who stated the pt's PCP is no longer with the practice and the pt doesn't have a PCP currently. Explained that Dr. Albert Huff is out of the office until next week and I will not be able to get any recommendations until then from him. Reviewed recent DC summaries from recent hospital admissions and explained to pt's wife that it appears d/t and AKI, nephrotoxic medications were stopped which could be why the Lasix  was held. Pt's wife states that because the Lasix  was not taken for a couple of months, the pharmacy won't refill it again now. Advised pt's wife to call nephrologist office and explain that while they wanted cardiology to refill this, at this time we are not able to and to ask to see if they could prescribe anything for the time being to help with the swelling. Advised pt's wife to take pt to urgent care if they were still unable to get any further recommendations as well as consider ED if symptoms began (shortness of breath, etc). Pt's wife thanked us  for the suggestions and verbalized understanding of plan.  Called Kristie [facility] RN) back and explained the conversation and suggestions given to pt's wife. Calvin Schultz verbalized understanding of plan and had no further questions at this time.

## 2023-09-26 NOTE — Telephone Encounter (Signed)
 Agree, we spoke on 09/25/2023.  Our group is following him for his A.Fib..  Defer to PCP or Primary Nephrologist for diuretic dosing given his recent AKI / prolonged hospitalization/ etc.  Thanks.  [place] Coldren [place], DO, FACC

## 2023-09-29 NOTE — Telephone Encounter (Signed)
 Spoke with pt's wife and explained that per Dr. Albert Huff, with the pt's recent AKI and prolonged hospital stay, nephrology or pt's PCP will need to be the ones to follow for diuretic use. Pt's wife verbalized understanding and thanked us  for our help.

## 2023-09-30 ENCOUNTER — Encounter: Payer: Self-pay | Admitting: Cardiology

## 2023-09-30 NOTE — Telephone Encounter (Signed)
 Pt called in stating he is still swelling. He states he doesn't have an appt with them until Sept.   Pt c/o swelling/edema: STAT if pt has developed SOB within 24 hours  If swelling, where is the swelling located? Testicles and legs   How much weight have you gained and in what time span? Not sure   Have you gained 2 pounds in a day or 5 pounds in a week? Not sure   Do you have a log of your daily weights (if so, list)? Did not record   Are you currently taking a fluid pill? No   Are you currently SOB? No   Have you traveled recently in a car or plane for an extended period of time? No

## 2023-09-30 NOTE — Telephone Encounter (Signed)
 Error

## 2023-09-30 NOTE — Telephone Encounter (Signed)
 Spoke with patient and he states he was calling to see about his diuretic. Did inform patient per Dr. Albert Huff he will need to reach out to his nephrologist or PCP for diuretics because of his recent AKI and prolonged hospital stay. He verbalized understanding

## 2023-10-01 ENCOUNTER — Emergency Department (HOSPITAL_COMMUNITY)

## 2023-10-01 ENCOUNTER — Other Ambulatory Visit: Payer: Self-pay

## 2023-10-01 ENCOUNTER — Inpatient Hospital Stay (HOSPITAL_COMMUNITY)
Admission: EM | Admit: 2023-10-01 | Discharge: 2023-10-16 | DRG: 264 | Disposition: A | Discharge status: Skilled Nursing Facility | Attending: Internal Medicine | Admitting: Internal Medicine

## 2023-10-01 ENCOUNTER — Encounter (HOSPITAL_COMMUNITY): Payer: Self-pay

## 2023-10-01 DIAGNOSIS — I5033 Acute on chronic diastolic (congestive) heart failure: Secondary | ICD-10-CM | POA: Diagnosis present

## 2023-10-01 DIAGNOSIS — I82612 Acute embolism and thrombosis of superficial veins of left upper extremity: Secondary | ICD-10-CM | POA: Diagnosis not present

## 2023-10-01 DIAGNOSIS — Z992 Dependence on renal dialysis: Secondary | ICD-10-CM | POA: Diagnosis not present

## 2023-10-01 DIAGNOSIS — E11621 Type 2 diabetes mellitus with foot ulcer: Secondary | ICD-10-CM | POA: Diagnosis present

## 2023-10-01 DIAGNOSIS — I4510 Unspecified right bundle-branch block: Secondary | ICD-10-CM | POA: Diagnosis present

## 2023-10-01 DIAGNOSIS — Z79899 Other long term (current) drug therapy: Secondary | ICD-10-CM

## 2023-10-01 DIAGNOSIS — I120 Hypertensive chronic kidney disease with stage 5 chronic kidney disease or end stage renal disease: Secondary | ICD-10-CM | POA: Diagnosis not present

## 2023-10-01 DIAGNOSIS — L89626 Pressure-induced deep tissue damage of left heel: Secondary | ICD-10-CM | POA: Diagnosis present

## 2023-10-01 DIAGNOSIS — R600 Localized edema: Secondary | ICD-10-CM | POA: Diagnosis present

## 2023-10-01 DIAGNOSIS — E118 Type 2 diabetes mellitus with unspecified complications: Secondary | ICD-10-CM | POA: Diagnosis present

## 2023-10-01 DIAGNOSIS — N186 End stage renal disease: Secondary | ICD-10-CM | POA: Diagnosis present

## 2023-10-01 DIAGNOSIS — N179 Acute kidney failure, unspecified: Secondary | ICD-10-CM | POA: Diagnosis present

## 2023-10-01 DIAGNOSIS — L97529 Non-pressure chronic ulcer of other part of left foot with unspecified severity: Secondary | ICD-10-CM

## 2023-10-01 DIAGNOSIS — I482 Chronic atrial fibrillation, unspecified: Secondary | ICD-10-CM | POA: Diagnosis present

## 2023-10-01 DIAGNOSIS — L89619 Pressure ulcer of right heel, unspecified stage: Secondary | ICD-10-CM | POA: Diagnosis present

## 2023-10-01 DIAGNOSIS — D631 Anemia in chronic kidney disease: Secondary | ICD-10-CM | POA: Diagnosis present

## 2023-10-01 DIAGNOSIS — N185 Chronic kidney disease, stage 5: Secondary | ICD-10-CM | POA: Diagnosis not present

## 2023-10-01 DIAGNOSIS — E8809 Other disorders of plasma-protein metabolism, not elsewhere classified: Secondary | ICD-10-CM | POA: Diagnosis present

## 2023-10-01 DIAGNOSIS — I493 Ventricular premature depolarization: Secondary | ICD-10-CM | POA: Diagnosis present

## 2023-10-01 DIAGNOSIS — Z794 Long term (current) use of insulin: Secondary | ICD-10-CM

## 2023-10-01 DIAGNOSIS — I472 Ventricular tachycardia, unspecified: Secondary | ICD-10-CM | POA: Diagnosis present

## 2023-10-01 DIAGNOSIS — L97429 Non-pressure chronic ulcer of left heel and midfoot with unspecified severity: Secondary | ICD-10-CM | POA: Diagnosis present

## 2023-10-01 DIAGNOSIS — N138 Other obstructive and reflux uropathy: Secondary | ICD-10-CM | POA: Diagnosis present

## 2023-10-01 DIAGNOSIS — L97419 Non-pressure chronic ulcer of right heel and midfoot with unspecified severity: Secondary | ICD-10-CM | POA: Diagnosis present

## 2023-10-01 DIAGNOSIS — I4729 Other ventricular tachycardia: Secondary | ICD-10-CM | POA: Diagnosis not present

## 2023-10-01 DIAGNOSIS — R601 Generalized edema: Secondary | ICD-10-CM | POA: Diagnosis not present

## 2023-10-01 DIAGNOSIS — I132 Hypertensive heart and chronic kidney disease with heart failure and with stage 5 chronic kidney disease, or end stage renal disease: Principal | ICD-10-CM | POA: Diagnosis present

## 2023-10-01 DIAGNOSIS — E1122 Type 2 diabetes mellitus with diabetic chronic kidney disease: Secondary | ICD-10-CM | POA: Diagnosis present

## 2023-10-01 DIAGNOSIS — E66811 Obesity, class 1: Secondary | ICD-10-CM | POA: Diagnosis present

## 2023-10-01 DIAGNOSIS — L97519 Non-pressure chronic ulcer of other part of right foot with unspecified severity: Secondary | ICD-10-CM

## 2023-10-01 DIAGNOSIS — Z6835 Body mass index (BMI) 35.0-35.9, adult: Secondary | ICD-10-CM

## 2023-10-01 DIAGNOSIS — E872 Acidosis, unspecified: Secondary | ICD-10-CM | POA: Diagnosis present

## 2023-10-01 DIAGNOSIS — D649 Anemia, unspecified: Secondary | ICD-10-CM | POA: Diagnosis not present

## 2023-10-01 DIAGNOSIS — E785 Hyperlipidemia, unspecified: Secondary | ICD-10-CM | POA: Diagnosis present

## 2023-10-01 DIAGNOSIS — M62838 Other muscle spasm: Secondary | ICD-10-CM | POA: Diagnosis present

## 2023-10-01 DIAGNOSIS — N200 Calculus of kidney: Secondary | ICD-10-CM | POA: Diagnosis present

## 2023-10-01 DIAGNOSIS — N401 Enlarged prostate with lower urinary tract symptoms: Secondary | ICD-10-CM | POA: Diagnosis present

## 2023-10-01 LAB — COMPREHENSIVE METABOLIC PANEL WITH GFR
ALT: 15 U/L (ref 0–44)
AST: 17 U/L (ref 15–41)
Albumin: 2.5 g/dL — ABNORMAL LOW (ref 3.5–5.0)
Alkaline Phosphatase: 75 U/L (ref 38–126)
Anion gap: 11 (ref 5–15)
BUN: 91 mg/dL — ABNORMAL HIGH (ref 8–23)
CO2: 19 mmol/L — ABNORMAL LOW (ref 22–32)
Calcium: 9 mg/dL (ref 8.9–10.3)
Chloride: 108 mmol/L (ref 98–111)
Creatinine, Ser: 4.55 mg/dL — ABNORMAL HIGH (ref 0.61–1.24)
GFR, Estimated: 13 mL/min — ABNORMAL LOW (ref >60)
Glucose, Bld: 116 mg/dL — ABNORMAL HIGH (ref 70–99)
Potassium: 3.9 mmol/L (ref 3.5–5.1)
Sodium: 138 mmol/L (ref 135–145)
Total Bilirubin: 0.5 mg/dL (ref 0.0–1.2)
Total Protein: 5.9 g/dL — ABNORMAL LOW (ref 6.5–8.1)

## 2023-10-01 LAB — CBC WITH DIFFERENTIAL/PLATELET
Abs Immature Granulocytes: 0.1 10*3/uL — ABNORMAL HIGH (ref 0.00–0.07)
Basophils Absolute: 0.1 10*3/uL (ref 0.0–0.1)
Basophils Relative: 0 %
Eosinophils Absolute: 0.2 10*3/uL (ref 0.0–0.5)
Eosinophils Relative: 1 %
HCT: 20 % — ABNORMAL LOW (ref 39.0–52.0)
Hemoglobin: 6 g/dL — CL (ref 13.0–17.0)
Immature Granulocytes: 1 %
Lymphocytes Relative: 6 %
Lymphs Abs: 0.8 10*3/uL (ref 0.7–4.0)
MCH: 26 pg (ref 26.0–34.0)
MCHC: 30 g/dL (ref 30.0–36.0)
MCV: 86.6 fL (ref 80.0–100.0)
Monocytes Absolute: 1.1 10*3/uL — ABNORMAL HIGH (ref 0.1–1.0)
Monocytes Relative: 8 %
Neutro Abs: 10.8 10*3/uL — ABNORMAL HIGH (ref 1.7–7.7)
Neutrophils Relative %: 84 %
Platelets: 340 10*3/uL (ref 150–400)
RBC: 2.31 MIL/uL — ABNORMAL LOW (ref 4.22–5.81)
RDW: 17.8 % — ABNORMAL HIGH (ref 11.5–15.5)
WBC: 13 10*3/uL — ABNORMAL HIGH (ref 4.0–10.5)
nRBC: 0 % (ref 0.0–0.2)

## 2023-10-01 LAB — BRAIN NATRIURETIC PEPTIDE: B Natriuretic Peptide: 640.6 pg/mL — ABNORMAL HIGH (ref 0.0–100.0)

## 2023-10-01 LAB — POC OCCULT BLOOD, ED: Fecal Occult Bld: POSITIVE — AB

## 2023-10-01 LAB — CBG MONITORING, ED: Glucose-Capillary: 105 mg/dL — ABNORMAL HIGH (ref 70–99)

## 2023-10-01 LAB — MAGNESIUM: Magnesium: 2.2 mg/dL (ref 1.7–2.4)

## 2023-10-01 LAB — PREPARE RBC (CROSSMATCH)

## 2023-10-01 LAB — GLUCOSE, CAPILLARY: Glucose-Capillary: 150 mg/dL — ABNORMAL HIGH (ref 70–99)

## 2023-10-01 MED ORDER — INSULIN ASPART 100 UNIT/ML IJ SOLN
0.0000 [IU] | Freq: Three times a day (TID) | INTRAMUSCULAR | Status: DC
  Administered 2023-10-02 – 2023-10-03 (×4): 1 [IU] via SUBCUTANEOUS
  Administered 2023-10-04: 2 [IU] via SUBCUTANEOUS
  Administered 2023-10-04: 1 [IU] via SUBCUTANEOUS
  Administered 2023-10-05 – 2023-10-06 (×3): 2 [IU] via SUBCUTANEOUS
  Administered 2023-10-06: 1 [IU] via SUBCUTANEOUS
  Administered 2023-10-06 – 2023-10-07 (×2): 2 [IU] via SUBCUTANEOUS
  Administered 2023-10-07 – 2023-10-08 (×2): 1 [IU] via SUBCUTANEOUS
  Administered 2023-10-08: 5 [IU] via SUBCUTANEOUS
  Administered 2023-10-10: 3 [IU] via SUBCUTANEOUS
  Administered 2023-10-10: 1 [IU] via SUBCUTANEOUS
  Administered 2023-10-10: 3 [IU] via SUBCUTANEOUS
  Administered 2023-10-11: 1 [IU] via SUBCUTANEOUS
  Administered 2023-10-11 – 2023-10-12 (×2): 2 [IU] via SUBCUTANEOUS
  Administered 2023-10-13: 1 [IU] via SUBCUTANEOUS
  Administered 2023-10-13: 2 [IU] via SUBCUTANEOUS
  Administered 2023-10-14: 5 [IU] via SUBCUTANEOUS
  Administered 2023-10-15: 2 [IU] via SUBCUTANEOUS
  Administered 2023-10-16: 1 [IU] via SUBCUTANEOUS

## 2023-10-01 MED ORDER — SODIUM CHLORIDE 0.9% IV SOLUTION: Freq: Once | INTRAVENOUS | Status: DC

## 2023-10-01 MED ORDER — FUROSEMIDE 10 MG/ML IJ SOLN
40.0000 mg | Freq: Once | INTRAMUSCULAR | Status: AC
  Administered 2023-10-01: 40 mg via INTRAVENOUS
  Filled 2023-10-01: qty 4

## 2023-10-01 MED ORDER — ONDANSETRON HCL 4 MG/2ML IJ SOLN: 4.0000 mg | Freq: Four times a day (QID) | INTRAMUSCULAR | Status: DC | PRN

## 2023-10-01 MED ORDER — ACETAMINOPHEN 650 MG RE SUPP: 650.0000 mg | Freq: Four times a day (QID) | RECTAL | Status: DC | PRN

## 2023-10-01 MED ORDER — ACETAMINOPHEN 325 MG PO TABS
650.0000 mg | ORAL_TABLET | Freq: Four times a day (QID) | ORAL | Status: DC | PRN
  Administered 2023-10-07 – 2023-10-15 (×3): 650 mg via ORAL
  Filled 2023-10-01 (×3): qty 2

## 2023-10-01 MED ORDER — SENNOSIDES-DOCUSATE SODIUM 8.6-50 MG PO TABS: 1.0000 | ORAL_TABLET | Freq: Every evening | ORAL | Status: DC | PRN

## 2023-10-01 MED ORDER — FUROSEMIDE 10 MG/ML IJ SOLN
80.0000 mg | Freq: Once | INTRAMUSCULAR | Status: AC
  Administered 2023-10-01: 80 mg via INTRAVENOUS
  Filled 2023-10-01: qty 8

## 2023-10-01 MED ORDER — ONDANSETRON HCL 4 MG PO TABS: 4.0000 mg | ORAL_TABLET | Freq: Four times a day (QID) | ORAL | Status: DC | PRN

## 2023-10-01 NOTE — ED Provider Notes (Signed)
 Physical Exam  BP (!) 120/98   Pulse 79   Temp 97.8 F (36.6 C) (Oral)   Resp 20   SpO2 98%   Physical Exam Vitals and nursing note reviewed.  Constitutional:      General: He is not in acute distress.    Appearance: Normal appearance.  HENT:     Head: Normocephalic and atraumatic.  Eyes:     General:        Right eye: No discharge.        Left eye: No discharge.  Cardiovascular:     Comments: Regular rate and rhythm.  S1/S2 are distinct without any evidence of murmur, rubs, or gallops.  Radial pulses are 2+ bilaterally.  Dorsalis pedis pulses are 2+ bilaterally.   Pulmonary:     Comments: Clear to auscultation bilaterally.  Normal effort.  No respiratory distress.  No evidence of wheezes, rales, or rhonchi heard throughout. Abdominal:     General: Abdomen is flat. Bowel sounds are normal. There is distension.     Tenderness: There is no abdominal tenderness. There is no guarding or rebound.  Genitourinary:    Comments: There is a significant amount of scrotal swelling. Testicles are normal.  Rectal exam did reveal good tone.  There was brown stool in the rectal vault.  No obvious skin tags or external hemorrhoids. Musculoskeletal:        General: Normal range of motion.     Cervical back: Neck supple.     Right lower leg: 3+ Edema present.     Left lower leg: 3+ Edema present.  Skin:    General: Skin is warm and dry.     Findings: No rash.  Neurological:     General: No focal deficit present.     Mental Status: He is alert.  Psychiatric:        Mood and Affect: Mood normal.        Behavior: Behavior normal.     Procedures  .Critical Care  Performed by: Darletta Ehrich, PA-C Authorized by: Darletta Ehrich, PA-C   Critical care provider statement:    Critical care time (minutes):  35   Critical care time was exclusive of:  Separately billable procedures and treating other patients   Critical care was necessary to treat or prevent imminent or life-threatening  deterioration of the following conditions:  Circulatory failure   Critical care was time spent personally by me on the following activities:  Blood draw for specimens, development of treatment plan with patient or surrogate, discussions with consultants, ordering and performing treatments and interventions, ordering and review of laboratory studies, ordering and review of radiographic studies, pulse oximetry and re-evaluation of patient's condition   ED Course / MDM   Clinical Course as of 10/01/23 1942  Thu Oct 01, 2023  1923 Comprehensive metabolic panel(!) No significant electrolyte abnormalities.  Creatinine is certainly elevated but improved from patient's baseline. [CF]  1924 Magnesium  Negative. [CF]  1924 CBC with Differential(!!) Anemia worse than the patient's baseline.  There is evidence of leukocytosis as well. [CF]  1924 Brain natriuretic peptide(!) Elevated. [CF]  1924 POC occult blood, ED(!) Positive. [CF]  1941 I spoke with Dr. Yvonne Hering, with [facility] hospitalist who agrees to admit the patient.  [CF]    Clinical Course User Index [CF] Darletta Ehrich, PA-C   Medical Decision Making Amount and/or Complexity of Data Reviewed Labs: ordered. Decision-making details documented in ED Course. Radiology: ordered.  Risk Prescription drug management. Decision  regarding hospitalization.   Accepted handoff at shift change from Smoot PA-C. Please see prior provider note for more detail.   Briefly: Patient is 73 y.o. male who presents to the emergency department today for further evaluation of swelling in his lower extremities, testicles, and abdomen.  Been progressively worsening over the last 2 weeks.  The patient was seen evaluated here back in April for similar symptoms.  Was ultimately discharged.  Did not follow-up with outpatient doctors as no one would claim care.  He has not been taking Lasix .  Denies shortness of breath, chest pain, fever, chills.  Does endorse a  coughing spell yesterday.  DDX: concern for anasarca likely renal related.  Needs diuresis.  Plan: Admit for diuresis and further management.  Receiving 1 unit of blood now.  Patient is currently stable with normal vital signs for admission.           Angelyn Kennel [place], PA-C 10/01/23 1942    Merdis Stalling, MD 10/01/23 2039

## 2023-10-01 NOTE — ED Provider Notes (Signed)
 [place] EMERGENCY DEPARTMENT AT [facility] Provider Note   CSN: [id number] Arrival date & time: 10/01/23  1258     History  Chief Complaint  Patient presents with   Leg Swelling    Calvin Schultz is a 73 y.o. male.  Patient with history of afib, CKD, diabetes, hypertension, hyperlipidemia presents today with complaints of leg swelling.  Was just recently admitted for this, found to have worsening CKD was briefly on dialysis, was attributed to bilateral obstructive kidney stones that were stented with some improvement.  He was diuresed when he was admitted he also had C. difficile which likely caused worsening of the symptoms.  He was discharged with improvement in his symptoms without any medications and told to follow-up with nephrology outpatient.  Unfortunately, his nephrology appointment is not scheduled until the fall.  He also has lost contact with his PCP during that time as she has moved.  Over the last few weeks he has noticed that he is swelling again.  He denies any chest pain or shortness of breath.  Stated that he is profoundly swollen in his legs his scrotum, his abdomen and his arms.  He is unable to walk due to this.  He has tried compression stockings with no improvement.  He has tried to reach out to his PCP, cardiology, and nephrology to try to get placed back on diuresis but has not been successful.  Denies any fevers or pain.  He states that he is making urine regularly.  The history is provided by the patient. No language interpreter was used.       Home Medications Prior to Admission medications   Medication Sig Start Date End Date Taking? Authorizing Provider  acetaminophen  (TYLENOL ) 325 MG tablet Take 2 tablets (650 mg total) by mouth every 4 (four) hours as needed for mild pain (pain score 1-3) or fever. 08/05/23   Angiulli, Everlyn Hockey, PA-C  diclofenac  Sodium (VOLTAREN ) 1 % GEL Apply 2 g topically 4 (four) times daily. 08/05/23   Angiulli, Everlyn Hockey, PA-C   HYDROcodone -acetaminophen  (NORCO/VICODIN) 5-325 MG tablet Take 1 tablet by mouth every 6 (six) hours as needed for severe pain (pain score 7-10). 08/05/23   Angiulli, Everlyn Hockey, PA-C  iron  polysaccharides (NIFEREX) 150 MG capsule Take 1 capsule (150 mg total) by mouth daily. 07/11/23   Vann, Jessica U, DO  midodrine  (PROAMATINE ) 5 MG tablet Take 1 tablet (5 mg total) by mouth 3 (three) times daily as needed (Orthosatic hypotension). 08/05/23   Angiulli, Daniel J, PA-C  multivitamin (RENA-VIT) TABS tablet Take 1 tablet by mouth at bedtime. 08/05/23   Angiulli, Daniel J, PA-C  Nystatin (GERHARDT'S BUTT CREAM) CREA Apply 1 Application topically 2 (two) times daily. 08/20/23   Angiulli, Everlyn Hockey, PA-C  psyllium (HYDROCIL/METAMUCIL) 95 % PACK Take 1 packet by mouth 2 (two) times daily. 08/05/23   Angiulli, Everlyn Hockey, PA-C  sodium bicarbonate  650 MG tablet Take 2 tablets (1,300 mg total) by mouth 3 (three) times daily. 08/20/23   Angiulli, Everlyn Hockey, PA-C  tamsulosin  (FLOMAX ) 0.4 MG CAPS capsule Take 2 capsules (0.8 mg total) by mouth daily after supper. 08/27/23   Lylia Sand, MD  tiZANidine  (ZANAFLEX ) 4 MG tablet Take 1 tablet (4 mg total) by mouth 2 (two) times daily as needed for muscle spasms. 08/05/23   Angiulli, Everlyn Hockey, PA-C  traMADol  (ULTRAM ) 50 MG tablet Take 1 tablet (50 mg total) by mouth every 12 (twelve) hours as needed for moderate pain (pain  score 4-6). 08/20/23   Angiulli, Everlyn Hockey, PA-C  traZODone  (DESYREL ) 150 MG tablet Take 0.5 tablets (75 mg total) by mouth at bedtime as needed for sleep. As needed for insomnia 08/28/23   Lylia Sand, MD      Allergies    Patient has no known allergies.    Review of Systems   Review of Systems  Cardiovascular:  Positive for leg swelling.  All other systems reviewed and are negative.   Physical Exam Updated Vital Signs BP 127/63 (BP Location: Right Arm)   Pulse 61   Temp 97.6 F (36.4 C) (Oral)   Resp 17   SpO2 100%  Physical Exam Vitals and  nursing note reviewed.  Constitutional:      General: He is not in acute distress.    Appearance: Normal appearance. He is normal weight. He is not ill-appearing, toxic-appearing or diaphoretic.  HENT:     Head: Normocephalic and atraumatic.  Cardiovascular:     Rate and Rhythm: Normal rate and regular rhythm.     Heart sounds: Normal heart sounds.  Pulmonary:     Effort: Pulmonary effort is normal. No respiratory distress.     Breath sounds: Normal breath sounds.  Abdominal:     General: There is distension.     Comments: Pitting edema in the abdomen  Musculoskeletal:        General: Normal range of motion.     Cervical back: Normal range of motion.     Comments: 4+ pitting weeping edema noted to bilateral lower extremities  Pitting weeping edema noted also to the bilateral upper extremities.  No erythema, warmth, crepitus, or purulence.  Skin:    General: Skin is warm and dry.  Neurological:     General: No focal deficit present.     Mental Status: He is alert and oriented to person, place, and time.  Psychiatric:        Mood and Affect: Mood normal.        Behavior: Behavior normal.     ED Results / Procedures / Treatments   Labs (all labs ordered are listed, but only abnormal results are displayed) Labs Reviewed  CBC WITH DIFFERENTIAL/PLATELET  COMPREHENSIVE METABOLIC PANEL WITH GFR  BRAIN NATRIURETIC PEPTIDE  MAGNESIUM   URINALYSIS, ROUTINE W REFLEX MICROSCOPIC    EKG EKG Interpretation Date/Time:  Thursday Oct 01 2023 14:03:02 EDT Ventricular Rate:  84 PR Interval:    QRS Duration:  137 QT Interval:  427 QTC Calculation: 434 R Axis:   -54  Text Interpretation: Atrial fibrillation Ventricular bigeminy Right bundle branch block Inferior infarct, old Lateral leads are also involved Interpretation limited secondary to artifact however no significant change was found Confirmed by Celesta Coke ([id number]) on 10/01/2023 2:49:51 PM  Radiology No results  found.  Procedures Procedures    Medications Ordered in ED Medications - No data to display  ED Course/ Medical Decision Making/ A&P Clinical Course as of 10/03/23 0739  Thu Oct 01, 2023  1923 Comprehensive metabolic panel(!) No significant electrolyte abnormalities.  Creatinine is certainly elevated but improved from patient's baseline. [CF]  1924 Magnesium  Negative. [CF]  1924 CBC with Differential(!!) Anemia worse than the patient's baseline.  There is evidence of leukocytosis as well. [CF]  1924 Brain natriuretic peptide(!) Elevated. [CF]  1924 POC occult blood, ED(!) Positive. [CF]  1941 I spoke with Dr. Yvonne Hering, with [facility] hospitalist who agrees to admit the patient.  [CF]    Clinical Course User Index [CF] Angelyn Kennel  Melven Stable, PA-C                                 Medical Decision Making Amount and/or Complexity of Data Reviewed Labs: ordered. Decision-making details documented in ED Course. Radiology: ordered.  Risk Decision regarding hospitalization.   This patient is a 73 y.o. male who presents to the ED for concern of leg swelling, this involves an extensive number of treatment options, and is a complaint that carries with it a high risk of complications and morbidity. The emergent differential diagnosis prior to evaluation includes, but is not limited to,  CHF, CKD . This is not an exhaustive differential.   Past Medical History / Co-morbidities / Social History:  has a past medical history of Arthritis, Atrial fibrillation (HCC), CKD (chronic kidney disease) stage 3, GFR 30-59 ml/min (HCC), Diabetes mellitus without complication (HCC), Dysrhythmia, Hyperlipidemia, and Hypertension.  Additional history: Chart reviewed. Pertinent results include: recently admitted for similar issues, found to have CKD, briefly on CRRT and diuresed with Lasix .  Found to have bilateral obstructive kidney stones which were stented.  Was discharged without diuresis.  Physical  Exam: Physical exam performed. The pertinent findings include: diffuse pitting edema to bilateral lower extremities, abdominal wall, and bilateral upper extremities  Lab Tests: I ordered, and personally interpreted labs.  Labs pending at shift change.   Cardiac Monitoring:  The patient was maintained on a cardiac monitor.  My attending physician Dr. Nora Beal viewed and interpreted the cardiac monitored which showed an underlying rhythm of: afib (patient in chronic afib). I agree with this interpretation.   Disposition:  Patients labs are pending at shift change, will determine dispo. Will likely need IV diuresis and admission given diffuse fluid overload.  Care handoff to [facility], PA-C at shift change. Please see their note for continued evaluation and dispo  Final Clinical Impression(s) / ED Diagnoses Final diagnoses:  None    Rx / DC Orders ED Discharge Orders     None         Sherra Dk, PA-C 10/03/23 0739    Kingsley, Victoria K, DO 10/08/23 1557

## 2023-10-01 NOTE — ED Triage Notes (Signed)
 Pt bib ems from home c.o edema that has gotten worse over the past 6 weeks after he was admitted for kidney failure. Pt has pitting edema to his legs, arms, scrotum and abd. Pt denies pain or any other complaints.

## 2023-10-01 NOTE — H&P (Signed)
 History and Physical  Calvin Schultz:[id number] DOB: 1951-01-17 DOA: 10/01/2023  PCP: Calvin Leather, MD   Chief Complaint: Worsening body edema  HPI: Calvin Schultz is a 73 y.o. male with medical history significant for A-fib, CKD stage V, T2DM, HLD, HTN and BPH who presents to the ED for evaluation of worsening body edema. Patient reports that after his long hospitalization for C. difficile infection complicated by septic shock, his Lasix  was discontinued at discharge.  He was unable to follow-up with his PCP because she left the practice. Over the last 6 weeks, he has had gradual swelling of his lower extremities.  They attempted multiple times to get a prescription for Lasix  however cardiology informed him that either his PCP or nephrology needed to resume the Lasix .  They have been unsuccessful in getting a prescription for Lasix  and over the last 2 weeks, his edema has significantly progressed to mid thighs, abdomen scrotum and arms.  He reports feeling heavy due to the edema and fatigue but denies any chest pain, shortness of breath, palpitations, abdominal pain, cough, nausea, vomiting, hematochezia, melena, hemoptysis, hematuria or hematemesis.  ED Course: Initial vitals stable, afebrile, normotensive and 100% on room air.  Labs significant for WBC 13.0, Hgb 6.0, platelet 340, bicarb 19, BUN/creatinine 81/4.55, albumin  2.5, positive fecal occult test, BNP 640 and normal mag.  EKG shows A-fib with ventricular bigeminy and RBBB. CXR shows cardiomegaly with vascular congestion and small bilateral pleural effusions. Patient received IV Lasix  40 mg x 1.  He was given 1 unit PRBC. TRH was consulted for admission.  Review of Systems: Please see HPI for pertinent positives and negatives. A complete 10 system review of systems are otherwise negative.  Past Medical History:  Diagnosis Date   Arthritis    Atrial fibrillation (HCC)    CKD (chronic kidney disease) stage 3, GFR 30-59 ml/min (HCC)     Diabetes mellitus without complication (HCC)    Dysrhythmia    Hyperlipidemia    Hypertension    Past Surgical History:  Procedure Laterality Date   CYSTOSCOPY W/ URETERAL STENT PLACEMENT Right 08/05/2023   Procedure: CYSTOSCOPY, WITH RETROGRADE PYELOGRAM AND URETERAL STENT INSERTION;  Surgeon: Calvin Schultz., MD;  Location: [facility] OR;  Service: Urology;  Laterality: Right;   CYSTOSCOPY/URETEROSCOPY/HOLMIUM LASER/STENT PLACEMENT Left 07/24/2023   Procedure: CYSTOSCOPY/URETEROSCOPY/HOLMIUM LASER/ STENT PLACEMENT/REMOVAL OF NEPHROSTOMY TUBE;  Surgeon: Calvin Coyer, MD;  Location: [facility] ORS;  Service: Urology;  Laterality: Left;  90 MINUTE CASE   CYSTOSCOPY/URETEROSCOPY/HOLMIUM LASER/STENT PLACEMENT Bilateral 08/18/2023   Procedure: CYSTOSCOPY/URETEROSCOPY/HOLMIUM LASER/STENT PLACEMENT;  Surgeon: Calvin Coyer, MD;  Location: [facility];  Service: Urology;  Laterality: Bilateral;   IR NEPHROSTOMY PLACEMENT LEFT  06/03/2023   KIDNEY SURGERY     Social History:  reports that he has never smoked. He has never used smokeless tobacco. He reports that he does not drink alcohol and does not use drugs.  No Known Allergies  Family History  Problem Relation Age of Onset   Dementia Mother      Prior to Admission medications   Medication Sig Start Date End Date Taking? Authorizing Provider  acetaminophen  (TYLENOL ) 325 MG tablet Take 2 tablets (650 mg total) by mouth every 4 (four) hours as needed for mild pain (pain score 1-3) or fever. 08/05/23   Schultz, Calvin Hockey, PA-C  diclofenac  Sodium (VOLTAREN ) 1 % GEL Apply 2 g topically 4 (four) times daily. 08/05/23   Schultz, Calvin Hockey, PA-C  HYDROcodone -acetaminophen  (NORCO/VICODIN) 5-325 MG tablet Take 1 tablet  by mouth every 6 (six) hours as needed for severe pain (pain score 7-10). 08/05/23   Schultz, Calvin Hockey, PA-C  iron  polysaccharides (NIFEREX) 150 MG capsule Take 1 capsule (150 mg total) by mouth daily. 07/11/23   Schultz, Calvin U, DO  midodrine   (PROAMATINE ) 5 MG tablet Take 1 tablet (5 mg total) by mouth 3 (three) times daily as needed (Orthosatic hypotension). 08/05/23   Schultz, Calvin J, PA-C  multivitamin (RENA-VIT) TABS tablet Take 1 tablet by mouth at bedtime. 08/05/23   Schultz, Calvin J, PA-C  Nystatin (GERHARDT'S BUTT CREAM) CREA Apply 1 Application topically 2 (two) times daily. 08/20/23   Schultz, Calvin Hockey, PA-C  psyllium (HYDROCIL/METAMUCIL) 95 % PACK Take 1 packet by mouth 2 (two) times daily. 08/05/23   Schultz, Calvin Hockey, PA-C  sodium bicarbonate  650 MG tablet Take 2 tablets (1,300 mg total) by mouth 3 (three) times daily. 08/20/23   Schultz, Calvin Hockey, PA-C  tamsulosin  (FLOMAX ) 0.4 MG CAPS capsule Take 2 capsules (0.8 mg total) by mouth daily after supper. 08/27/23   Calvin Sand, MD  tiZANidine  (ZANAFLEX ) 4 MG tablet Take 1 tablet (4 mg total) by mouth 2 (two) times daily as needed for muscle spasms. 08/05/23   Schultz, Calvin Hockey, PA-C  traMADol  (ULTRAM ) 50 MG tablet Take 1 tablet (50 mg total) by mouth every 12 (twelve) hours as needed for moderate pain (pain score 4-6). 08/20/23   Schultz, Calvin Hockey, PA-C  traZODone  (DESYREL ) 150 MG tablet Take 0.5 tablets (75 mg total) by mouth at bedtime as needed for sleep. As needed for insomnia 08/28/23   Calvin Sand, MD    Physical Exam: BP 128/77 (BP Location: Right Arm)   Pulse 84   Temp 97.8 F (36.6 C) (Oral)   Resp 13   SpO2 100%  General: Pleasant, well-appearing elderly man laying in bed. No acute distress. HEENT: [place]/AT. Anicteric sclera CV: RRR. No murmurs, rubs, or gallops. 3+ BLE edema up to the groins Pulmonary: Lungs CTAB. Normal effort. Distant rales at the bases. Abdominal: Soft, nontender. Abdominal wall edema. Normal bowel sounds. Extremities: Palpable radial and DP pulses. Normal ROM. Skin: Warm and dry. No obvious rash or lesions.   GU: Significant scrotal swelling, nontender with no erythema Neuro: A&Ox3. Moves all extremities. Normal sensation to light  touch. No focal deficit. Psych: Normal mood and affect          Labs on Admission:  Basic Metabolic Panel: Recent Labs  Lab 10/01/23 1543  NA 138  K 3.9  CL 108  CO2 19*  GLUCOSE 116*  BUN 91*  CREATININE 4.55*  CALCIUM  9.0  MG 2.2   Liver Function Tests: Recent Labs  Lab 10/01/23 1543  AST 17  ALT 15  ALKPHOS 75  BILITOT 0.5  PROT 5.9*  ALBUMIN  2.5*   No results for input(s): "LIPASE", "AMYLASE" in the last 168 hours. No results for input(s): "AMMONIA" in the last 168 hours. CBC: Recent Labs  Lab 10/01/23 1543  WBC 13.0*  NEUTROABS 10.8*  HGB 6.0*  HCT 20.0*  MCV 86.6  PLT 340   Cardiac Enzymes: No results for input(s): "CKTOTAL", "CKMB", "CKMBINDEX", "TROPONINI" in the last 168 hours. BNP (last 3 results) Recent Labs    10/01/23 1543  BNP 640.6*    ProBNP (last 3 results) No results for input(s): "PROBNP" in the last 8760 hours.  CBG: Recent Labs  Lab 10/01/23 1724  GLUCAP 105*    Radiological Exams on Admission: DG Chest 2 View  Result Date: 10/01/2023 CLINICAL DATA:  Cough.  Extremity swelling. EXAM: CHEST - 2 VIEW COMPARISON:  06/27/2023 FINDINGS: The previous left-sided central line has been removed. Cardiomegaly is unchanged. Aortic atherosclerosis. There are small bilateral pleural effusions. Vascular congestion. No pneumothorax. IMPRESSION: Cardiomegaly with vascular congestion. Small bilateral pleural effusions. Electronically Signed   By: Chadwick Colonel M.D.   On: 10/01/2023 18:45   Assessment/Plan Lofton Leon is a 73 y.o. male with medical history significant for A-fib, CKD stage V, T2DM, HLD, HTN and BPH who presents to the ED for evaluation of worsening body edema and admitted for anasarca  # Anasarca - Presented with worsening body edema over the last 2 weeks - Recent TTE in January showed moderate LVH but normal EF and no valvular abnormalities - Presentation likely secondary to progressive renal failure - S/p IV Lasix  40 mg x  1 with minimal urine output - Nephrology consulted, recommends IV Lasix  80 mg x 1 tonight - Strict I&O's, daily weights  # CKD stage V - BUN/creatinine of 91/4.55 with GFR of 13 with no electrolyte abnormalities - Has had progression of his kidney disease from stage IV to stage V over the last few months - Significant anasarca as above with no shortness of breath or signs of uremia - Nephrology consulted, will see tomorrow - IV Lasix  as above - Trend renal function - Avoid nephrotoxic agents  # Normocytic anemia - Found to have Hgb of 6.0 on admission from baseline of 8-9 - Positive FOBT however patient with no melena, hematochezia, hemoptysis, hematuria or hematemesis - Likely secondary to anemia of chronic disease versus iron  deficiency anemia - Status post 1 unit PRBC - Continue iron  supplementation *** - Follow-up posttransfusion H&H, iron  studies, ferritin and vitamin B12  # T2DM - A1c of 6.2% 3 months ago - SSI with meals, CBG monitoring  # A-fib - EKG shows A-fib with ventricular bigeminy and RBBB - HR stable in the 70s to 80s - Telemetry - Follow-up with cardiology in the outpatient  # Anxiety and depression - Continue trazodone   # BPH - Continue tamsulosin    DVT prophylaxis: Heparin     Code Status: Full Code  Consults called: Nephrology  Family Communication: Discussed admission with spouse at bedside  Severity of Illness: The appropriate patient status for this patient is INPATIENT. Inpatient status is judged to be reasonable and necessary in order to provide the required intensity of service to ensure the patient's safety. The patient's presenting symptoms, physical exam findings, and initial radiographic and laboratory data in the context of their chronic comorbidities is felt to place them at high risk for further clinical deterioration. Furthermore, it is not anticipated that the patient will be medically stable for discharge from the hospital within 2  midnights of admission.   * I certify that at the point of admission it is my clinical judgment that the patient will require inpatient hospital care spanning beyond 2 midnights from the point of admission due to high intensity of service, high risk for further deterioration and high frequency of surveillance required.*  Level of care: Med-Surg   This record has been created using Conservation officer, historic buildings. Errors have been sought and corrected, but may not always be located. Such creation errors do not reflect on the standard of care.   Vita Grip, MD 10/01/2023, 8:48 PM [facility] Pager: [phone] Isaiah 41:10   If 7PM-7AM, please contact night-coverage [email or web address] Password [email or web address]

## 2023-10-02 ENCOUNTER — Encounter (HOSPITAL_COMMUNITY): Payer: Self-pay | Admitting: Student

## 2023-10-02 DIAGNOSIS — N185 Chronic kidney disease, stage 5: Secondary | ICD-10-CM | POA: Diagnosis present

## 2023-10-02 DIAGNOSIS — R601 Generalized edema: Secondary | ICD-10-CM | POA: Diagnosis not present

## 2023-10-02 LAB — RENAL FUNCTION PANEL
Albumin: 2.3 g/dL — ABNORMAL LOW (ref 3.5–5.0)
Anion gap: 10 (ref 5–15)
BUN: 89 mg/dL — ABNORMAL HIGH (ref 8–23)
CO2: 17 mmol/L — ABNORMAL LOW (ref 22–32)
Calcium: 8.7 mg/dL — ABNORMAL LOW (ref 8.9–10.3)
Chloride: 110 mmol/L (ref 98–111)
Creatinine, Ser: 4.38 mg/dL — ABNORMAL HIGH (ref 0.61–1.24)
GFR, Estimated: 14 mL/min — ABNORMAL LOW (ref >60)
Glucose, Bld: 94 mg/dL (ref 70–99)
Phosphorus: 7 mg/dL — ABNORMAL HIGH (ref 2.5–4.6)
Potassium: 3.9 mmol/L (ref 3.5–5.1)
Sodium: 137 mmol/L (ref 135–145)

## 2023-10-02 LAB — RETICULOCYTES
Immature Retic Fract: 15.7 % (ref 2.3–15.9)
RBC.: 2.83 MIL/uL — ABNORMAL LOW (ref 4.22–5.81)
Retic Count, Absolute: 41.6 10*3/uL (ref 19.0–186.0)
Retic Ct Pct: 1.5 % (ref 0.4–3.1)

## 2023-10-02 LAB — CBC
HCT: 21.4 % — ABNORMAL LOW (ref 39.0–52.0)
Hemoglobin: 6.5 g/dL — CL (ref 13.0–17.0)
MCH: 26.5 pg (ref 26.0–34.0)
MCHC: 30.4 g/dL (ref 30.0–36.0)
MCV: 87.3 fL (ref 80.0–100.0)
Platelets: 292 10*3/uL (ref 150–400)
RBC: 2.45 MIL/uL — ABNORMAL LOW (ref 4.22–5.81)
RDW: 17.2 % — ABNORMAL HIGH (ref 11.5–15.5)
WBC: 11.6 10*3/uL — ABNORMAL HIGH (ref 4.0–10.5)
nRBC: 0 % (ref 0.0–0.2)

## 2023-10-02 LAB — PROTIME-INR
INR: 1.2 (ref 0.8–1.2)
Prothrombin Time: 15 s (ref 11.4–15.2)

## 2023-10-02 LAB — TECHNOLOGIST SMEAR REVIEW: Plt Morphology: NORMAL

## 2023-10-02 LAB — CBC WITH DIFFERENTIAL/PLATELET
Abs Immature Granulocytes: 0.07 10*3/uL (ref 0.00–0.07)
Basophils Absolute: 0.1 10*3/uL (ref 0.0–0.1)
Basophils Relative: 0 %
Eosinophils Absolute: 0.2 10*3/uL (ref 0.0–0.5)
Eosinophils Relative: 2 %
HCT: 25.1 % — ABNORMAL LOW (ref 39.0–52.0)
Hemoglobin: 7.8 g/dL — ABNORMAL LOW (ref 13.0–17.0)
Immature Granulocytes: 1 %
Lymphocytes Relative: 6 %
Lymphs Abs: 0.8 10*3/uL (ref 0.7–4.0)
MCH: 27.5 pg (ref 26.0–34.0)
MCHC: 31.1 g/dL (ref 30.0–36.0)
MCV: 88.4 fL (ref 80.0–100.0)
Monocytes Absolute: 1.2 10*3/uL — ABNORMAL HIGH (ref 0.1–1.0)
Monocytes Relative: 9 %
Neutro Abs: 11.3 10*3/uL — ABNORMAL HIGH (ref 1.7–7.7)
Neutrophils Relative %: 82 %
Platelets: 314 10*3/uL (ref 150–400)
RBC: 2.84 MIL/uL — ABNORMAL LOW (ref 4.22–5.81)
RDW: 17.5 % — ABNORMAL HIGH (ref 11.5–15.5)
WBC: 13.6 10*3/uL — ABNORMAL HIGH (ref 4.0–10.5)
nRBC: 0 % (ref 0.0–0.2)

## 2023-10-02 LAB — TSH: TSH: 1.517 u[IU]/mL (ref 0.350–4.500)

## 2023-10-02 LAB — FOLATE: Folate: 21.8 ng/mL (ref >5.9)

## 2023-10-02 LAB — HEMOGLOBIN AND HEMATOCRIT, BLOOD
HCT: 24.3 % — ABNORMAL LOW (ref 39.0–52.0)
Hemoglobin: 7.6 g/dL — ABNORMAL LOW (ref 13.0–17.0)

## 2023-10-02 LAB — GLUCOSE, CAPILLARY
Glucose-Capillary: 109 mg/dL — ABNORMAL HIGH (ref 70–99)
Glucose-Capillary: 128 mg/dL — ABNORMAL HIGH (ref 70–99)
Glucose-Capillary: 145 mg/dL — ABNORMAL HIGH (ref 70–99)
Glucose-Capillary: 140 mg/dL — ABNORMAL HIGH (ref 70–99)

## 2023-10-02 LAB — PREPARE RBC (CROSSMATCH)

## 2023-10-02 LAB — IRON AND TIBC
Iron: 50 ug/dL (ref 45–182)
Saturation Ratios: 20 % (ref 17.9–39.5)
TIBC: 249 ug/dL — ABNORMAL LOW (ref 250–450)
UIBC: 199 ug/dL

## 2023-10-02 LAB — FIBRINOGEN: Fibrinogen: 533 mg/dL — ABNORMAL HIGH (ref 210–475)

## 2023-10-02 LAB — FERRITIN: Ferritin: 76 ng/mL (ref 24–336)

## 2023-10-02 MED ORDER — PANTOPRAZOLE SODIUM 40 MG IV SOLR
40.0000 mg | Freq: Two times a day (BID) | INTRAVENOUS | Status: DC
  Administered 2023-10-02 – 2023-10-05 (×7): 40 mg via INTRAVENOUS
  Filled 2023-10-02 (×7): qty 10

## 2023-10-02 MED ORDER — FUROSEMIDE 10 MG/ML IJ SOLN
80.0000 mg | Freq: Two times a day (BID) | INTRAMUSCULAR | Status: DC
  Administered 2023-10-02: 80 mg via INTRAVENOUS
  Filled 2023-10-02: qty 8

## 2023-10-02 MED ORDER — SODIUM BICARBONATE 650 MG PO TABS
1300.0000 mg | ORAL_TABLET | Freq: Three times a day (TID) | ORAL | Status: DC
  Administered 2023-10-02 – 2023-10-09 (×22): 1300 mg via ORAL
  Filled 2023-10-02 (×22): qty 2

## 2023-10-02 MED ORDER — TAMSULOSIN HCL 0.4 MG PO CAPS
0.4000 mg | ORAL_CAPSULE | Freq: Every day | ORAL | Status: DC
  Administered 2023-10-02 – 2023-10-16 (×14): 0.4 mg via ORAL
  Filled 2023-10-02 (×14): qty 1

## 2023-10-02 MED ORDER — TIZANIDINE HCL 4 MG PO TABS
4.0000 mg | ORAL_TABLET | Freq: Two times a day (BID) | ORAL | Status: DC | PRN
  Administered 2023-10-10 – 2023-10-14 (×3): 4 mg via ORAL
  Filled 2023-10-02 (×3): qty 1

## 2023-10-02 MED ORDER — SODIUM CHLORIDE 0.9% IV SOLUTION: Freq: Once | INTRAVENOUS | Status: AC

## 2023-10-02 MED ORDER — TRAZODONE HCL 50 MG PO TABS
50.0000 mg | ORAL_TABLET | Freq: Every evening | ORAL | Status: DC | PRN
  Administered 2023-10-02 – 2023-10-15 (×6): 50 mg via ORAL
  Filled 2023-10-02 (×8): qty 1

## 2023-10-02 MED ORDER — FUROSEMIDE 10 MG/ML IJ SOLN: 80.0000 mg | Freq: Every day | INTRAMUSCULAR | Status: DC

## 2023-10-02 NOTE — Progress Notes (Signed)
 [facility] Progress Note Patient: Calvin Schultz VWU:[id number] DOB: 23-Jun-1950 DOA: 10/01/2023  DOS: the patient was seen and examined on 10/02/2023  Brief Hospital Course: PMH of CKD 5, chronic A-fib, T2DM, HLD, HTN, BPH presented to the hospital with complaints of worsening swelling of his legs. Was recently hospitalized for septic shock secondary to C. difficile colitis.  Lasix  was discontinued.  Presented to the hospital now with worsening swelling for last 6 weeks progressively worsening. Nephrology consulted. Apparently received IV diuresis.  Assessment and Plan: CKD stage V. Baseline serum creatinine appears to be around 5.6.  On admission serum creatinine was 4.5 Currently improving to 4.38 after diuresis. For now we will monitor.  Anasarca. Likely in the setting of poor clearance after stopping Lasix . Receiving aggressive IV diuresis.  Will monitor.  Anemia of chronic kidney disease with acute worsening. Baseline hemoglobin 8.6.  On admission hemoglobin was 6.0.  Likely dilutional in nature. After 2 PRBC hemoglobin is 7.8 and remaining steady. No active bleeding reported although Hemoccult was positive. Stool was brown in color today as well as yesterday. For now continue PPI twice daily but as long as hemoglobin remains stable above 7 without any further transfusion would not be considering any further consultation as patient does not appear to have any active bleeding.  Bilateral heel ulcer. Outpatient podiatry follow-up recommended Appreciate wound care in the hospital.  HTN. Continue home blood pressure medication for now but holding Norvasc .  BPH. Flomax .  Will continue.  Chronic muscle spasm. On Zanaflex .  Needed.  Metabolic acidosis.   In setting of CKD. On bicarb.  Monitor.   Subjective: No acute complaint.  No nausea vomiting fever no chills.  No bleeding.  Physical Exam: General: in Mild distress, No Rash Cardiovascular: S1 and S2 Present, No  Murmur Respiratory: Good respiratory effort, Bilateral Air entry present. No Crackles, No wheezes Abdomen: Bowel Sound present, No tenderness Extremities: Persistent severe bilateral edema Neuro: Alert and oriented x3, no new focal deficit  Data Reviewed: I have Reviewed nursing notes, Vitals, and Lab results. Since last encounter, pertinent lab results CBC and BMP   . I have ordered test including CBC and BMP  .   Disposition: Status is: Inpatient Remains inpatient appropriate because: Monitor for stability of H&H as well as further workup in case of worsening renal function while treating with aggressive diuresis  Place TED hose Start: 10/01/23 1938   Family Communication: No one at bedside Level of care: Telemetry Medical   Vitals:   10/02/23 0006 10/02/23 0454 10/02/23 0845 10/02/23 1751  BP: 110/74 127/84 (!) 105/53 119/63  Pulse: 61 77 (!) 42 (!) 40  Resp: 16 17 18 19   Temp: 97.6 F (36.4 C) 98.2 F (36.8 C) 98 F (36.7 C)   TempSrc: Oral Oral Oral   SpO2: 100% 99% 100% 99%  Weight:  115.5 kg    Height:         Author: Charlean Congress, MD 10/02/2023 7:12 PM  Please look on [email or web address] to find out who is on call.

## 2023-10-02 NOTE — Hospital Course (Addendum)
 PMH of CKD 5, chronic A-fib, T2DM, HLD, HTN, BPH presented to the hospital with complaints of worsening swelling of his legs. Was recently hospitalized for septic shock secondary to C. difficile colitis.  Lasix  was discontinued.  Presented to the hospital now with worsening swelling for last 6 weeks progressively worsening. Nephrology consulted. Started on IV diuresis.  With limited improvement and therefore was started on HD on 6/7.  TDC was placed as well as aVF was placed on 6/6.  Assessment and Plan: CKD stage V.  Now progressing to ESRD. Acute on chronic diastolic CHF Anasarca. Hypoalbuminemia with third spacing. Baseline serum creatinine appears to be around 5.6.  On admission serum creatinine was 4.5 Initiated on IV diuresis with very slow improvement in edema. Nephrology was consulted. Currently on hemodialysis with improvement in swelling. Plan is to continue HD outpatient.  Anemia of chronic kidney disease with acute worsening. Baseline hemoglobin 8.6. On admission hemoglobin was 6.0. Likely dilutional in nature due to volume overload. After 2 PRBC hemoglobin elevated appropriately on remaining stable. Transfuse for hemoglobin less than 8.  Bilateral heel ulcer. Outpatient podiatry follow-up recommended, referral placed. Appreciate wound care in the hospital.   HTN. Continue home blood pressure medication for now but holding Norvasc .  BPH. Flomax .  Will continue.  Chronic muscle spasm. On Zanaflex .  Needed.  Metabolic acidosis.   In setting of CKD.

## 2023-10-02 NOTE — Progress Notes (Signed)
 New Admission Note:   Arrival Method: Stretcher Mental Orientation: alert x 4 Telemetry: box 8 Assessment: Completed Skin: see flowsheet IV: NSL Pain: none Tubes: none Safety Measures: Safety Fall Prevention Plan has been discussed Admission: Completed [facility] Orientation: Patient has been orientated to the room, unit and staff.  Family: wife at bedside  Orders have been reviewed and implemented. Will continue to monitor the patient. Call light has been placed within reach and bed alarm has been activated.   Charls Cooks BSN, RN Phone number: [phone]

## 2023-10-02 NOTE — Consult Note (Signed)
 [facility]  HISTORY AND PHYSICAL  Calvin Schultz is an 73 y.o. male.    Chief Complaint: anasarca  HPI: Pt is a 31M with a PMH sig for nephrolithiasis, Afib, CKD V, DM II, HLD, HTN, and BPH for eval and recs re: anasarca and volume overload.  Pt noted increased swelling over the past several weeks.  Had been swelling since d/c 08/21/23.  Prolonged hospital course with C diff colitis complicated by obstructive uropathy, rehab, etc.  PCP left the practice so could not get anything from her.    Noted in ED Cr was 4.83 and had allover anasarca.  Sig anemia, FOBT +.  Getting a unit of blood.  Good UOP to IV Lasix  so far.  In this setting we are asked to see.  PMH: Past Medical History:  Diagnosis Date   Arthritis    Atrial fibrillation (HCC)    CKD (chronic kidney disease) stage 3, GFR 30-59 ml/min (HCC)    Diabetes mellitus without complication (HCC)    Dysrhythmia    Hyperlipidemia    Hypertension    PSH: Past Surgical History:  Procedure Laterality Date   CYSTOSCOPY W/ URETERAL STENT PLACEMENT Right 08/05/2023   Procedure: CYSTOSCOPY, WITH RETROGRADE PYELOGRAM AND URETERAL STENT INSERTION;  Surgeon: Mellie Sprinkle., MD;  Location: [facility] OR;  Service: Urology;  Laterality: Right;   CYSTOSCOPY/URETEROSCOPY/HOLMIUM LASER/STENT PLACEMENT Left 07/24/2023   Procedure: CYSTOSCOPY/URETEROSCOPY/HOLMIUM LASER/ STENT PLACEMENT/REMOVAL OF NEPHROSTOMY TUBE;  Surgeon: Christina Coyer, MD;  Location: [facility]S;  Service: Urology;  Laterality: Left;  90 MINUTE CASE   CYSTOSCOPY/URETEROSCOPY/HOLMIUM LASER/STENT PLACEMENT Bilateral 08/18/2023   Procedure: CYSTOSCOPY/URETEROSCOPY/HOLMIUM LASER/STENT PLACEMENT;  Surgeon: Christina Coyer, MD;  Location: [facility] OR;  Service: Urology;  Laterality: Bilateral;   IR NEPHROSTOMY PLACEMENT LEFT  06/03/2023   KIDNEY SURGERY       Past Medical History:  Diagnosis Date   Arthritis    Atrial fibrillation (HCC)    CKD (chronic kidney disease) stage 3,  GFR 30-59 ml/min (HCC)    Diabetes mellitus without complication (HCC)    Dysrhythmia    Hyperlipidemia    Hypertension     Medications:  Scheduled:  sodium chloride    Intravenous Once   furosemide   80 mg Intravenous BID   insulin  aspart  0-9 Units Subcutaneous TID WC   pantoprazole (PROTONIX) IV  40 mg Intravenous Q12H   sodium bicarbonate   1,300 mg Oral TID   tamsulosin   0.4 mg Oral QPC supper    Medications Prior to Admission  Medication Sig Dispense Refill   acetaminophen  (TYLENOL ) 325 MG tablet Take 2 tablets (650 mg total) by mouth every 4 (four) hours as needed for mild pain (pain score 1-3) or fever.     amLODipine  (NORVASC ) 5 MG tablet Take 1 tablet by mouth daily.     diclofenac  Sodium (VOLTAREN ) 1 % GEL Apply 2 g topically 4 (four) times daily. (Patient taking differently: Apply 2 g topically 4 (four) times daily as needed (joint pain).)     HYDROcodone -acetaminophen  (NORCO/VICODIN) 5-325 MG tablet Take 1 tablet by mouth every 6 (six) hours as needed for severe pain (pain score 7-10). 30 tablet 0   insulin  detemir (LEVEMIR) 100 UNIT/ML injection Inject 20 Units into the skin at bedtime as needed (hyperglycemia).     iron  polysaccharides (NIFEREX) 150 MG capsule Take 1 capsule (150 mg total) by mouth daily.     midodrine  (PROAMATINE ) 5 MG tablet Take 1 tablet (5 mg total) by mouth 3 (three) times daily  as needed (Orthosatic hypotension).     multivitamin (RENA-VIT) TABS tablet Take 1 tablet by mouth at bedtime.     Nystatin (GERHARDT'S BUTT CREAM) CREA Apply 1 Application topically 2 (two) times daily. 60 each 0   psyllium (HYDROCIL/METAMUCIL) 95 % PACK Take 1 packet by mouth 2 (two) times daily.     sodium bicarbonate  650 MG tablet Take 2 tablets (1,300 mg total) by mouth 3 (three) times daily. 180 tablet 0   tamsulosin  (FLOMAX ) 0.4 MG CAPS capsule Take 2 capsules (0.8 mg total) by mouth daily after supper. 30 capsule 0   tiZANidine  (ZANAFLEX ) 4 MG tablet Take 1 tablet (4 mg  total) by mouth 2 (two) times daily as needed for muscle spasms.     traMADol  (ULTRAM ) 50 MG tablet Take 1 tablet (50 mg total) by mouth every 12 (twelve) hours as needed for moderate pain (pain score 4-6). 30 tablet 0    ALLERGIES:  No Known Allergies  FAM HX: Family History  Problem Relation Age of Onset   Dementia Mother     Social History:   reports that he has never smoked. He has never used smokeless tobacco. He reports that he does not drink alcohol and does not use drugs.  ROS: ROS: all other systems reviewed and are negative except per HPI  Blood pressure (!) 105/53, pulse (!) 42, temperature 98 F (36.7 C), temperature source [place], resp. rate 18, height 5\' 10"  (1.778 m), weight 115.5 kg, SpO2 100%. PHYSICAL EXAM: Physical Exam GEN NAD HEENT eomi perrl NECK + JVD to angle of mandible PULM SOB, clear anteriorly CV RRR ABD soft EXT 3+ anasarca NEURO AAO x 3 SKIN some weeping   Results for orders placed or performed during the hospital encounter of 10/01/23 (from the past 48 hours)  CBC with Differential     Status: Abnormal   Collection Time: 10/01/23  3:43 PM  Result Value Ref Range   WBC 13.0 (H) 4.0 - 10.5 K/uL   RBC 2.31 (L) 4.22 - 5.81 MIL/uL   Hemoglobin 6.0 (LL) 13.0 - 17.0 g/dL    Comment: This critical result has verified and been called to K CARDWELL RN by Taffy Fabian on 05 29 2025 at 1616, and has been read back.  REPEATED TO VERIFY CORRECTED ON 05/29 AT 1624: PREVIOUSLY REPORTED AS 6.0 This critical result has verified and been called to K CARDWELL RN by Taffy Fabian on 05 29 2025 at 1616, and has been read back.     HCT 20.0 (L) 39.0 - 52.0 %   MCV 86.6 80.0 - 100.0 fL   MCH 26.0 26.0 - 34.0 pg   MCHC 30.0 30.0 - 36.0 g/dL   RDW 16.1 (H) 09.6 - 04.5 %   Platelets 340 150 - 400 K/uL   nRBC 0.0 0.0 - 0.2 %   Neutrophils Relative % 84 %   Neutro Abs 10.8 (H) 1.7 - 7.7 K/uL   Lymphocytes Relative 6 %   Lymphs Abs 0.8 0.7 - 4.0 K/uL   Monocytes  Relative 8 %   Monocytes Absolute 1.1 (H) 0.1 - 1.0 K/uL   Eosinophils Relative 1 %   Eosinophils Absolute 0.2 0.0 - 0.5 K/uL   Basophils Relative 0 %   Basophils Absolute 0.1 0.0 - 0.1 K/uL   Immature Granulocytes 1 %   Abs Immature Granulocytes 0.10 (H) 0.00 - 0.07 K/uL    Comment: Performed at [facility], [address], [place], [place]  [address]  Comprehensive metabolic panel     Status: Abnormal   Collection Time: 10/01/23  3:43 PM  Result Value Ref Range   Sodium 138 135 - 145 mmol/L   Potassium 3.9 3.5 - 5.1 mmol/L   Chloride 108 98 - 111 mmol/L   CO2 19 (L) 22 - 32 mmol/L   Glucose, Bld 116 (H) 70 - 99 mg/dL    Comment: Glucose reference range applies only to samples taken after fasting for at least 8 hours.   BUN 91 (H) 8 - 23 mg/dL   Creatinine, Ser 6.04 (H) 0.61 - 1.24 mg/dL   Calcium  9.0 8.9 - 10.3 mg/dL   Total Protein 5.9 (L) 6.5 - 8.1 g/dL   Albumin  2.5 (L) 3.5 - 5.0 g/dL   AST 17 15 - 41 U/L   ALT 15 0 - 44 U/L   Alkaline Phosphatase 75 38 - 126 U/L   Total Bilirubin 0.5 0.0 - 1.2 mg/dL   GFR, Estimated 13 (L) >60 mL/min    Comment: (NOTE) Calculated using the CKD-EPI Creatinine Equation (2021)    Anion gap 11 5 - 15    Comment: Performed at [facility], [address], [place], [place] [address]  Brain natriuretic peptide     Status: Abnormal   Collection Time: 10/01/23  3:43 PM  Result Value Ref Range   B Natriuretic Peptide 640.6 (H) 0.0 - 100.0 pg/mL    Comment: Performed at [facility], [address] [place], [place] [address]  Magnesium      Status: None   Collection Time: 10/01/23  3:43 PM  Result Value Ref Range   Magnesium  2.2 1.7 - 2.4 mg/dL    Comment: Performed at [facility], [address], [place], [place] [address]  Type and screen [facility]     Status: None (Preliminary result)   Collection Time: 10/01/23  5:19 PM  Result Value Ref Range   ABO/RH(D) O POS    Antibody Screen NEG    Sample  Expiration 10/04/2023,2359    Unit Number [id number]    Blood Component Type RED CELLS,LR    Unit division 00    Status of Unit ISSUED,FINAL    Transfusion Status OK TO TRANSFUSE    Crossmatch Result      Compatible Performed at [facility], [address], [place], [place] [address]    Unit Number [id number]    Blood Component Type RED CELLS,LR    Unit division 00    Status of Unit ISSUED    Transfusion Status OK TO TRANSFUSE    Crossmatch Result Compatible   CBG monitoring, ED     Status: Abnormal   Collection Time: 10/01/23  5:24 PM  Result Value Ref Range   Glucose-Capillary 105 (H) 70 - 99 mg/dL    Comment: Glucose reference range applies only to samples taken after fasting for at least 8 hours.   Comment 1 Notify RN    Comment 2 Document in Chart   Prepare RBC (crossmatch)     Status: None   Collection Time: 10/01/23  5:42 PM  Result Value Ref Range   Order Confirmation      ORDER PROCESSED BY BLOOD BANK Performed at [facility], [address] [place], [place] [address]   POC occult blood, ED     Status: Abnormal   Collection Time: 10/01/23  5:56 PM  Result Value Ref Range  Fecal Occult Bld POSITIVE (A) NEGATIVE  Glucose, capillary     Status: Abnormal   Collection Time: 10/01/23 11:26 PM  Result Value Ref Range   Glucose-Capillary 150 (H) 70 - 99 mg/dL    Comment: Glucose reference range applies only to samples taken after fasting for at least 8 hours.  TSH     Status: None   Collection Time: 10/02/23  6:28 AM  Result Value Ref Range   TSH 1.517 0.350 - 4.500 uIU/mL    Comment: Performed by a 3rd Generation assay with a functional sensitivity of <=0.01 uIU/mL. Performed at [facility], [address] [place], [place] [address]   Renal function panel     Status: Abnormal   Collection Time: 10/02/23  6:28 AM  Result Value Ref Range   Sodium 137 135 - 145 mmol/L   Potassium 3.9 3.5 - 5.1 mmol/L   Chloride 110 98 - 111 mmol/L   CO2  17 (L) 22 - 32 mmol/L   Glucose, Bld 94 70 - 99 mg/dL    Comment: Glucose reference range applies only to samples taken after fasting for at least 8 hours.   BUN 89 (H) 8 - 23 mg/dL   Creatinine, Ser 6.04 (H) 0.61 - 1.24 mg/dL   Calcium  8.7 (L) 8.9 - 10.3 mg/dL   Phosphorus 7.0 (H) 2.5 - 4.6 mg/dL   Albumin  2.3 (L) 3.5 - 5.0 g/dL   GFR, Estimated 14 (L) >60 mL/min    Comment: (NOTE) Calculated using the CKD-EPI Creatinine Equation (2021)    Anion gap 10 5 - 15    Comment: Performed at [facility], [address], [place], [place] [address]  CBC     Status: Abnormal   Collection Time: 10/02/23  6:28 AM  Result Value Ref Range   WBC 11.6 (H) 4.0 - 10.5 K/uL   RBC 2.45 (L) 4.22 - 5.81 MIL/uL   Hemoglobin 6.5 (LL) 13.0 - 17.0 g/dL    Comment: REPEATED TO VERIFY THIS CRITICAL RESULT HAS VERIFIED AND BEEN CALLED TO R. FRAIL, RN BY JULIE MACEDA DEL ANGEL ON 05 30 2025 AT 0806, AND HAS BEEN READ BACK.     HCT 21.4 (L) 39.0 - 52.0 %   MCV 87.3 80.0 - 100.0 fL   MCH 26.5 26.0 - 34.0 pg   MCHC 30.4 30.0 - 36.0 g/dL   RDW 11.9 (H) 14.7 - 82.9 %   Platelets 292 150 - 400 K/uL    Comment: REPEATED TO VERIFY   nRBC 0.0 0.0 - 0.2 %    Comment: Performed at [facility], [address] [place], [place] [address]  Glucose, capillary     Status: Abnormal   Collection Time: 10/02/23  7:32 AM  Result Value Ref Range   Glucose-Capillary 109 (H) 70 - 99 mg/dL    Comment: Glucose reference range applies only to samples taken after fasting for at least 8 hours.  Prepare RBC (crossmatch)     Status: None   Collection Time: 10/02/23  9:00 AM  Result Value Ref Range   Order Confirmation      ORDER PROCESSED BY BLOOD BANK Performed at [facility], [address] [place], [place] [address]   CBC with Differential/Platelet     Status: Abnormal   Collection Time: 10/02/23 11:42 AM  Result Value Ref Range   WBC 13.6 (H) 4.0 - 10.5 K/uL   RBC 2.84 (L) 4.22 - 5.81 MIL/uL  Hemoglobin 7.8 (L) 13.0 - 17.0 g/dL   HCT 16.1 (L) 09.6 - 04.5 %   MCV 88.4 80.0 - 100.0 fL   MCH 27.5 26.0 - 34.0 pg   MCHC 31.1 30.0 - 36.0 g/dL   RDW 40.9 (H) 81.1 - 91.4 %   Platelets 314 150 - 400 K/uL   nRBC 0.0 0.0 - 0.2 %   Neutrophils Relative % 82 %   Neutro Abs 11.3 (H) 1.7 - 7.7 K/uL   Lymphocytes Relative 6 %   Lymphs Abs 0.8 0.7 - 4.0 K/uL   Monocytes Relative 9 %   Monocytes Absolute 1.2 (H) 0.1 - 1.0 K/uL   Eosinophils Relative 2 %   Eosinophils Absolute 0.2 0.0 - 0.5 K/uL   Basophils Relative 0 %   Basophils Absolute 0.1 0.0 - 0.1 K/uL   Immature Granulocytes 1 %   Abs Immature Granulocytes 0.07 0.00 - 0.07 K/uL    Comment: Performed at [facility], [address] [place], [place] [address]  Ferritin     Status: None   Collection Time: 10/02/23 11:45 AM  Result Value Ref Range   Ferritin 76 24 - 336 ng/mL    Comment: Performed at [facility], [address] [place], [place] [address]  Fibrinogen     Status: Abnormal   Collection Time: 10/02/23 11:45 AM  Result Value Ref Range   Fibrinogen 533 (H) 210 - 475 mg/dL    Comment: (NOTE) Fibrinogen results may be underestimated in patients receiving thrombolytic therapy. Performed at [facility], [address], [place], [place] [address]   Folate     Status: None   Collection Time: 10/02/23 11:45 AM  Result Value Ref Range   Folate 21.8 >5.9 ng/mL    Comment: Performed at [facility], [address] [place], [place] [address]  Iron  and TIBC     Status: Abnormal   Collection Time: 10/02/23 11:45 AM  Result Value Ref Range   Iron  50 45 - 182 ug/dL   TIBC 952 (L) 841 - 324 ug/dL   Saturation Ratios 20 17.9 - 39.5 %   UIBC 199 ug/dL    Comment: Performed at [facility], [address] [place], [place] [address]  Protime-INR     Status: None   Collection Time: 10/02/23 11:45 AM  Result Value Ref Range   Prothrombin Time 15.0 11.4 - 15.2 seconds   INR 1.2 0.8 - 1.2     Comment: (NOTE) INR goal varies based on device and disease states. Performed at [facility], [address], [place], [place] [address]   Reticulocytes     Status: Abnormal   Collection Time: 10/02/23 11:45 AM  Result Value Ref Range   Retic Ct Pct 1.5 0.4 - 3.1 %   RBC. 2.83 (L) 4.22 - 5.81 MIL/uL   Retic Count, Absolute 41.6 19.0 - 186.0 K/uL   Immature Retic Fract 15.7 2.3 - 15.9 %    Comment: Performed at [facility], [address], [place], [place] [address]  Hemoglobin and hematocrit, blood     Status: Abnormal   Collection Time: 10/02/23 11:45 AM  Result Value Ref Range   Hemoglobin 7.6 (L) 13.0 - 17.0 g/dL   HCT 47.4 (L) 25.9 - 56.3 %    Comment: Performed at [facility], [address], [place], [place] [address]  Glucose, capillary     Status: Abnormal  Collection Time: 10/02/23 12:05 PM  Result Value Ref Range   Glucose-Capillary 128 (H) 70 - 99 mg/dL    Comment: Glucose reference range applies only to samples taken after fasting for at least 8 hours.    DG Chest 2 View Result Date: 10/01/2023 CLINICAL DATA:  Cough.  Extremity swelling. EXAM: CHEST - 2 VIEW COMPARISON:  06/27/2023 FINDINGS: The previous left-sided central line has been removed. Cardiomegaly is unchanged. Aortic atherosclerosis. There are small bilateral pleural effusions. Vascular congestion. No pneumothorax. IMPRESSION: Cardiomegaly with vascular congestion. Small bilateral pleural effusions. Electronically Signed   By: Chadwick Colonel M.D.   On: 10/01/2023 18:45    Assessment/Plan  AKI on CKD V: Cr 5.8 4 2025, now down to 4.83 with sig volume overload  - IV Lasix  BID  - getting blood  - will need significant ongoing diuresis  - no need for HD at present--> if we can't get volume down otherwise we may need to consider  2.  FOBT +: getting unit of blood  3.  Acute on chronic CHF exac: last TTE 05/2023 OK  4.  Nephrolithiasis: no active symptoms now  5.  Dispo:  admitted  Leandra Pro 10/02/2023, 2:26 PM

## 2023-10-02 NOTE — Consult Note (Addendum)
 WOC Nurse Consult Note: Reason for Consult: wounds R heel and L lateral foot  Wound type: full thickness uncertain if r/t pressure or diabetic foot ulcers vs trauma from shoe rubbing  Pressure Injury POA: yes Measurement: see nursing flowsheet  Wound bed:R posterior heel/leg 80% black eschar 20% red; L lateral foot wound 100% black eschar  Drainage (amount, consistency, odor) see nursing flowsheet  Periwound: Dressing procedure/placement/frequency: Cleanse R heel and L foot wounds with Vashe wound cleanser [employer] [phone]) do not rinse and allow to dry.  Apply Xeroform gauze (Lawson [id number]) to wound bed daily, secure with silicone foam or dry gauze and Kerlix roll gauze whichever is preferred.   Place B feet in Prevalon boots to offload pressure.   No note can be found regarding podiatry/ortho or wound care center regarding ongoing management of these wounds.  Patient does have home health but no mention of these wounds in their notes as well.  Patient would benefit from referral at discharge to Wound Care Center or podiatry/ortho.   POC discussed with bedside nurse. WOC team will not follow. Re-consult if further needs arise.   Thank you,    Ronni Colace MSN, RN-BC, [employer] [phone]

## 2023-10-02 NOTE — Evaluation (Signed)
 Occupational Therapy Evaluation Patient Details Name: Calvin Schultz MRN: [id number] DOB: Feb 03, 1951 Today's Date: 10/02/2023   History of Present Illness   73 y.o. male with medical history significant for A-fib, CKD stage V, T2DM, HLD, HTN and BPH who presented 10/01/23 for evaluation of worsening body edema.  Patient with wounds to B feet.     Clinical Impressions Patient admitted for the diagnosis above.  PTA the patient reports he spends a lot time in his hospital bed, hoyer lift to wheelchair, and has a lot of assist from his spouse and [facility] RN for personal care.  He was receiving HH OT and PT, but still has not been able to stand or transfer without the mechanical lift.  Currently he presents with global weakness, pitting edema, and wounds to bilateral feet.  Patient was able to roll with Max A and heavy use of the side rails, and +2 for any attempted sitting EOB.  OT will follow in the acute setting to address deficits, and Patient will benefit from continued inpatient follow up therapy, <3 hours/day.       If plan is discharge home, recommend the following:   Assist for transportation;Two people to help with bathing/dressing/bathroom;Two people to help with walking and/or transfers     Functional Status Assessment   Patient has had a recent decline in their functional status and demonstrates the ability to make significant improvements in function in a reasonable and predictable amount of time.     Equipment Recommendations   None recommended by OT     Recommendations for Other Services         Precautions/Restrictions   Precautions Precautions: Fall Restrictions Weight Bearing Restrictions Per Provider Order: No     Mobility Bed Mobility Overal bed mobility: Needs Assistance Bed Mobility: Rolling, Supine to Sit, Sit to Supine Rolling: Max assist   Supine to sit: Mod assist, +2 for physical assistance Sit to supine: Max assist, +2 for physical assistance    General bed mobility comments: Rolls better to the L once he has a hold of the siderail.    Transfers                   General transfer comment: NT - patient states he has used the [facility] in the past      Balance Overall balance assessment: Needs assistance Sitting-balance support: Feet supported, Bilateral upper extremity supported Sitting balance-Leahy Scale: Fair                                     ADL either performed or assessed with clinical judgement   ADL Overall ADL's : Needs assistance/impaired Eating/Feeding: Set up;Bed level   Grooming: Wash/dry hands;Wash/dry face;Set up;Bed level   Upper Body Bathing: Moderate assistance;Bed level   Lower Body Bathing: Total assistance;Bed level   Upper Body Dressing : Moderate assistance;Bed level   Lower Body Dressing: Total assistance;Bed level       Toileting- Clothing Manipulation and Hygiene: Total assistance;Bed level               Vision Patient Visual Report: No change from baseline       Perception Perception: Not tested       Praxis Praxis: Not tested       Pertinent Vitals/Pain Pain Assessment Pain Assessment: Faces Faces Pain Scale: Hurts little more Pain Location: Joints of shoulder and hips/knees with AROM Pain Descriptors /  Indicators: Aching, Sore, Grimacing Pain Intervention(s): Monitored during session     Extremity/Trunk Assessment Upper Extremity Assessment Upper Extremity Assessment: Generalized weakness;Right hand dominant;LUE deficits/detail;RUE deficits/detail RUE Deficits / Details: Swelling throughout, shoulder flexion to 120 degrees. RUE Sensation: WNL RUE Coordination: WNL LUE Deficits / Details: Shoulder flexion to 90 degress with swelling throughout LUE Sensation: WNL LUE Coordination: WNL   Lower Extremity Assessment Lower Extremity Assessment: Defer to PT evaluation   Cervical / Trunk Assessment Cervical / Trunk Assessment: Kyphotic;Other  exceptions Cervical / Trunk Exceptions: Body Habitus   Communication Communication Communication: No apparent difficulties   Cognition Arousal: Alert Behavior During Therapy: WFL for tasks assessed/performed Cognition: No apparent impairments                               Following commands: Intact       Cueing  General Comments   Cueing Techniques: Verbal cues      Exercises     Shoulder Instructions      Home Living Family/patient expects to be discharged to:: Private residence Living Arrangements: Spouse/significant other;Children Available Help at Discharge: Family;Available 24 hours/day Type of Home: House Home Access: Ramped entrance     Home Layout: Two level;Able to live on main level with bedroom/bathroom     Bathroom Shower/Tub: Walk-in shower;Sponge bathes at baseline   Bathroom Toilet: Standard Bathroom Accessibility: Yes   Home Equipment: Agricultural consultant (2 wheels);Rollator (4 wheels);Cane - single point;BSC/3in1;Shower seat;Grab bars - toilet;Wheelchair - manual;Hospital bed   Additional Comments: Nurse, adult for OOB to wheelchair.      Prior Functioning/Environment Prior Level of Function : Needs assist       Physical Assist : Mobility (physical);ADLs (physical) Mobility (physical): Bed mobility;Transfers ADLs (physical): Bathing;Dressing;Toileting;IADLs Mobility Comments: Patient states hoyer lift from hospital bed to wheelchair.  Patient has not been able to stand. ADLs Comments: Spouse assists with bedlevel and wheelchair level bathing/dressing.  Bedpan at home.    OT Problem List: Decreased strength;Decreased range of motion;Decreased activity tolerance;Impaired balance (sitting and/or standing);Obesity;Increased edema;Pain   OT Treatment/Interventions: Self-care/ADL training;Therapeutic exercise;Therapeutic activities;DME and/or AE instruction;Patient/family education;Balance training      OT Goals(Current goals can be  found in the care plan section)   Acute Rehab OT Goals Patient Stated Goal: Not sure if he needs rehab or can return home OT Goal Formulation: With patient Time For Goal Achievement: 10/16/23 Potential to Achieve Goals: Fair ADL Goals Pt Will Perform Upper Body Bathing: with min assist;sitting Pt Will Perform Upper Body Dressing: with min assist;sitting Pt/caregiver will Perform Home Exercise Program: Increased strength;Both right and left upper extremity;With Supervision Additional ADL Goal #1: Patient will be Mod A of 1 to roll side to side for increased independence with toileting and decreased CG burden.   OT Frequency:  Min 2X/week    Co-evaluation              AM-PAC OT "6 Clicks" Daily Activity     Outcome Measure Help from another person eating meals?: None Help from another person taking care of personal grooming?: None Help from another person toileting, which includes using toliet, bedpan, or urinal?: Total Help from another person bathing (including washing, rinsing, drying)?: A Lot Help from another person to put on and taking off regular upper body clothing?: A Lot Help from another person to put on and taking off regular lower body clothing?: Total 6 Click Score: 14   End of Session  Nurse Communication: Mobility status  Activity Tolerance: Patient limited by fatigue Patient left: in bed;with call bell/phone within reach  OT Visit Diagnosis: Unsteadiness on feet (R26.81);Muscle weakness (generalized) (M62.81);Pain Pain - Right/Left: Left Pain - part of body: Shoulder                Time: 1250-1311 OT Time Calculation (min): 21 min Charges:  OT General Charges $OT Visit: 1 Visit OT Evaluation $OT Eval Moderate Complexity: 1 Mod  10/02/2023  RP, OTR/L  Acute Rehabilitation Services  Office:  [phone]   Benjamen Brand 10/02/2023, 1:22 PM

## 2023-10-03 DIAGNOSIS — R601 Generalized edema: Secondary | ICD-10-CM | POA: Diagnosis not present

## 2023-10-03 LAB — CBC
HCT: 21.9 % — ABNORMAL LOW (ref 39.0–52.0)
HCT: 23.2 % — ABNORMAL LOW (ref 39.0–52.0)
HCT: 26.6 % — ABNORMAL LOW (ref 39.0–52.0)
Hemoglobin: 6.8 g/dL — CL (ref 13.0–17.0)
Hemoglobin: 7.3 g/dL — ABNORMAL LOW (ref 13.0–17.0)
Hemoglobin: 8.4 g/dL — ABNORMAL LOW (ref 13.0–17.0)
MCH: 26.9 pg (ref 26.0–34.0)
MCH: 27.4 pg (ref 26.0–34.0)
MCH: 26.8 pg (ref 26.0–34.0)
MCHC: 31.1 g/dL (ref 30.0–36.0)
MCHC: 31.5 g/dL (ref 30.0–36.0)
MCHC: 31.6 g/dL (ref 30.0–36.0)
MCV: 86.6 fL (ref 80.0–100.0)
MCV: 87.2 fL (ref 80.0–100.0)
MCV: 85 fL (ref 80.0–100.0)
Platelets: 271 10*3/uL (ref 150–400)
Platelets: 280 10*3/uL (ref 150–400)
Platelets: 272 10*3/uL (ref 150–400)
RBC: 2.53 MIL/uL — ABNORMAL LOW (ref 4.22–5.81)
RBC: 2.66 MIL/uL — ABNORMAL LOW (ref 4.22–5.81)
RBC: 3.13 MIL/uL — ABNORMAL LOW (ref 4.22–5.81)
RDW: 17.5 % — ABNORMAL HIGH (ref 11.5–15.5)
RDW: 17.5 % — ABNORMAL HIGH (ref 11.5–15.5)
RDW: 17.5 % — ABNORMAL HIGH (ref 11.5–15.5)
WBC: 12.5 10*3/uL — ABNORMAL HIGH (ref 4.0–10.5)
WBC: 13.2 10*3/uL — ABNORMAL HIGH (ref 4.0–10.5)
WBC: 12.9 10*3/uL — ABNORMAL HIGH (ref 4.0–10.5)
nRBC: 0 % (ref 0.0–0.2)
nRBC: 0 % (ref 0.0–0.2)
nRBC: 0 % (ref 0.0–0.2)

## 2023-10-03 LAB — BASIC METABOLIC PANEL WITH GFR
Anion gap: 10 (ref 5–15)
BUN: 90 mg/dL — ABNORMAL HIGH (ref 8–23)
CO2: 18 mmol/L — ABNORMAL LOW (ref 22–32)
Calcium: 7.8 mg/dL — ABNORMAL LOW (ref 8.9–10.3)
Chloride: 108 mmol/L (ref 98–111)
Creatinine, Ser: 4.66 mg/dL — ABNORMAL HIGH (ref 0.61–1.24)
GFR, Estimated: 13 mL/min — ABNORMAL LOW (ref >60)
Glucose, Bld: 99 mg/dL (ref 70–99)
Potassium: 3.5 mmol/L (ref 3.5–5.1)
Sodium: 136 mmol/L (ref 135–145)

## 2023-10-03 LAB — MAGNESIUM: Magnesium: 2.1 mg/dL (ref 1.7–2.4)

## 2023-10-03 LAB — PREPARE RBC (CROSSMATCH)

## 2023-10-03 LAB — GLUCOSE, CAPILLARY
Glucose-Capillary: 138 mg/dL — ABNORMAL HIGH (ref 70–99)
Glucose-Capillary: 135 mg/dL — ABNORMAL HIGH (ref 70–99)
Glucose-Capillary: 130 mg/dL — ABNORMAL HIGH (ref 70–99)
Glucose-Capillary: 118 mg/dL — ABNORMAL HIGH (ref 70–99)

## 2023-10-03 MED ORDER — FUROSEMIDE 10 MG/ML IJ SOLN
80.0000 mg | Freq: Three times a day (TID) | INTRAMUSCULAR | Status: DC
  Administered 2023-10-03 – 2023-10-05 (×9): 80 mg via INTRAVENOUS
  Filled 2023-10-03 (×9): qty 8

## 2023-10-03 MED ORDER — SODIUM CHLORIDE 0.9% IV SOLUTION: Freq: Once | INTRAVENOUS | Status: AC

## 2023-10-03 MED ORDER — SODIUM CHLORIDE (PF) 0.9 % IJ SOLN
INTRAMUSCULAR | Status: AC
  Administered 2023-10-03: 10 mL
  Filled 2023-10-03: qty 10

## 2023-10-03 MED ORDER — DARBEPOETIN ALFA 100 MCG/0.5ML IJ SOSY
100.0000 ug | PREFILLED_SYRINGE | INTRAMUSCULAR | Status: DC
  Administered 2023-10-03 – 2023-10-10 (×2): 100 ug via SUBCUTANEOUS
  Filled 2023-10-03 (×2): qty 0.5

## 2023-10-03 NOTE — Progress Notes (Signed)
 [facility] Progress Note   Assessment/ Plan:    AKI on CKD V: Cr 5.8 4 2025, now down to 4.83 with sig volume overload             - IV Lasix  BID             - getting blood             - will need significant ongoing diuresis             - no need for HD at present--> if we can't get volume down otherwise we may need to consider  Increase Lasix  to 80 IV TID   2.  FOBT +: getting unit of blood; iron  studies OK, will do aranesp    3.  Acute on chronic CHF exac: last TTE 05/2023 OK, ? Diastolic component   4.  Nephrolithiasis: no active symptoms now   5.  Dispo: admitted  Subjective:    S/p blood this AM, aranesp  ordered.  Good diuresis, IV Lasix  increased to TID.  Cr stable   Objective:   BP 129/73 (BP Location: Left Arm)   Pulse 82   Temp 97.9 F (36.6 C) (Oral)   Resp 20   Ht 5\' 10"  (1.778 m)   Wt 115.5 kg   SpO2 96%   BMI 36.54 kg/m   Intake/Output Summary (Last 24 hours) at 10/03/2023 1408 Last data filed at 10/03/2023 1235 Gross per 24 hour  Intake 3856.5 ml  Output 2130 ml  Net 1726.5 ml   Weight change:   Physical Exam: GEN NAD HEENT eomi perrl NECK + JVD to angle of mandible PULM SOB, clear anteriorly CV RRR ABD soft EXT 3+ anasarca, improving NEURO AAO x 3 SKIN some weeping  Imaging: DG Chest 2 View Result Date: 10/01/2023 CLINICAL DATA:  Cough.  Extremity swelling. EXAM: CHEST - 2 VIEW COMPARISON:  06/27/2023 FINDINGS: The previous left-sided central line has been removed. Cardiomegaly is unchanged. Aortic atherosclerosis. There are small bilateral pleural effusions. Vascular congestion. No pneumothorax. IMPRESSION: Cardiomegaly with vascular congestion. Small bilateral pleural effusions. Electronically Signed   By: Chadwick Colonel M.D.   On: 10/01/2023 18:45    Labs: BMET Recent Labs  Lab 10/01/23 1543 10/02/23 0628 10/03/23 0336  NA 138 137 136  K 3.9 3.9 3.5  CL 108 110 108  CO2 19* 17* 18*  GLUCOSE 116* 94 99  BUN 91*  89* 90*  CREATININE 4.55* 4.38* 4.66*  CALCIUM  9.0 8.7* 7.8*  PHOS  --  7.0*  --    CBC Recent Labs  Lab 10/01/23 1543 10/02/23 0628 10/02/23 1142 10/02/23 1145 10/03/23 0336 10/03/23 0906  WBC 13.0* 11.6* 13.6*  --  12.5* 13.2*  NEUTROABS 10.8*  --  11.3*  --   --   --   HGB 6.0* 6.5* 7.8* 7.6* 6.8* 7.3*  HCT 20.0* 21.4* 25.1* 24.3* 21.9* 23.2*  MCV 86.6 87.3 88.4  --  86.6 87.2  PLT 340 292 314  --  271 280    Medications:     sodium chloride    Intravenous Once   darbepoetin (ARANESP ) injection - NON-DIALYSIS  100 mcg Subcutaneous Q Sat-1800   furosemide   80 mg Intravenous TID   insulin  aspart  0-9 Units Subcutaneous TID WC   pantoprazole  (PROTONIX ) IV  40 mg Intravenous Q12H   sodium bicarbonate   1,300 mg Oral TID   tamsulosin   0.4 mg Oral QPC supper    Leandra Pro MD 10/03/2023,  2:08 PM

## 2023-10-03 NOTE — Plan of Care (Signed)
 Messaged in regards to patient's hemoglobin being 6.8.  Orders placed to transfuse 1 unit of packed red blood cells patient is in agreement.

## 2023-10-03 NOTE — Progress Notes (Signed)
 PT Cancellation Note  Patient Details Name: Calvin Schultz MRN: [id number] DOB: 04/01/1951   Cancelled Treatment:    Reason Eval/Treat Not Completed: Medical issues which prohibited therapy (Pt receiving blood. States he doesnt feel up to moving today.  Will return to evaluate on Monday 6/2 per pt request.)   Florencia Hunter 10/03/2023, 10:26 AM Yessika Otte M,PT Acute Rehab Services [phone]

## 2023-10-03 NOTE — Plan of Care (Signed)
 Problem: Education: Goal: Ability to describe self-care measures that may prevent or decrease complications (Diabetes Survival Skills Education) will improve 10/03/2023 1855 by Amber Bail, RN Outcome: Progressing 10/03/2023 1855 by Amber Bail, RN Outcome: Progressing Goal: Individualized Educational Video(s) 10/03/2023 1855 by Amber Bail, RN Outcome: Progressing 10/03/2023 1855 by Amber Bail, RN Outcome: Progressing   Problem: Coping: Goal: Ability to adjust to condition or change in health will improve 10/03/2023 1855 by Amber Bail, RN Outcome: Progressing 10/03/2023 1855 by Amber Bail, RN Outcome: Progressing   Problem: Fluid Volume: Goal: Ability to maintain a balanced intake and output will improve 10/03/2023 1855 by Amber Bail, RN Outcome: Progressing 10/03/2023 1855 by Amber Bail, RN Outcome: Progressing   Problem: Health Behavior/Discharge Planning: Goal: Ability to identify and utilize available resources and services will improve 10/03/2023 1855 by Amber Bail, RN Outcome: Progressing 10/03/2023 1855 by Amber Bail, RN Outcome: Progressing Goal: Ability to manage health-related needs will improve 10/03/2023 1855 by Amber Bail, RN Outcome: Progressing 10/03/2023 1855 by Amber Bail, RN Outcome: Progressing   Problem: Metabolic: Goal: Ability to maintain appropriate glucose levels will improve 10/03/2023 1855 by Amber Bail, RN Outcome: Progressing 10/03/2023 1855 by Amber Bail, RN Outcome: Progressing   Problem: Nutritional: Goal: Maintenance of adequate nutrition will improve 10/03/2023 1855 by Amber Bail, RN Outcome: Progressing 10/03/2023 1855 by Amber Bail, RN Outcome: Progressing Goal: Progress toward achieving an optimal weight will improve 10/03/2023 1855 by Amber Bail, RN Outcome: Progressing 10/03/2023 1855 by Amber Bail, RN Outcome: Progressing   Problem: Skin Integrity: Goal: Risk for  impaired skin integrity will decrease 10/03/2023 1855 by Amber Bail, RN Outcome: Progressing 10/03/2023 1855 by Amber Bail, RN Outcome: Progressing   Problem: Tissue Perfusion: Goal: Adequacy of tissue perfusion will improve 10/03/2023 1855 by Amber Bail, RN Outcome: Progressing 10/03/2023 1855 by Amber Bail, RN Outcome: Progressing   Problem: Education: Goal: Knowledge of General Education information will improve Description: Including pain rating scale, medication(s)/side effects and non-pharmacologic comfort measures 10/03/2023 1855 by Amber Bail, RN Outcome: Progressing 10/03/2023 1855 by Amber Bail, RN Outcome: Progressing   Problem: Health Behavior/Discharge Planning: Goal: Ability to manage health-related needs will improve 10/03/2023 1855 by Amber Bail, RN Outcome: Progressing 10/03/2023 1855 by Amber Bail, RN Outcome: Progressing   Problem: Clinical Measurements: Goal: Ability to maintain clinical measurements within normal limits will improve 10/03/2023 1855 by Amber Bail, RN Outcome: Progressing 10/03/2023 1855 by Amber Bail, RN Outcome: Progressing Goal: Will remain free from infection 10/03/2023 1855 by Amber Bail, RN Outcome: Progressing 10/03/2023 1855 by Amber Bail, RN Outcome: Progressing Goal: Diagnostic test results will improve 10/03/2023 1855 by Amber Bail, RN Outcome: Progressing 10/03/2023 1855 by Amber Bail, RN Outcome: Progressing Goal: Respiratory complications will improve 10/03/2023 1855 by Amber Bail, RN Outcome: Progressing 10/03/2023 1855 by Amber Bail, RN Outcome: Progressing Goal: Cardiovascular complication will be avoided 10/03/2023 1855 by Amber Bail, RN Outcome: Progressing 10/03/2023 1855 by Amber Bail, RN Outcome: Progressing   Problem: Activity: Goal: Risk for activity intolerance will decrease 10/03/2023 1855 by Amber Bail, RN Outcome: Progressing 10/03/2023  1855 by Amber Bail, RN Outcome: Progressing   Problem: Nutrition: Goal: Adequate nutrition will be maintained 10/03/2023 1855 by Amber Bail, RN Outcome: Progressing 10/03/2023 1855 by Amber Bail, RN Outcome: Progressing   Problem: Coping: Goal: Level of anxiety will decrease 10/03/2023 1855 by Amber Bail, RN Outcome: Progressing 10/03/2023 1855 by Amber Bail, RN Outcome: Progressing   Problem: Elimination: Goal: Will not experience complications related to bowel motility Outcome: Progressing  Goal: Will not experience complications related to urinary retention Outcome: Progressing   Problem: Pain Managment: Goal: General experience of comfort will improve and/or be controlled Outcome: Progressing   Problem: Safety: Goal: Ability to remain free from injury will improve Outcome: Progressing   Problem: Skin Integrity: Goal: Risk for impaired skin integrity will decrease Outcome: Progressing

## 2023-10-03 NOTE — Plan of Care (Signed)
   Problem: Education: Goal: Ability to describe self-care measures that may prevent or decrease complications (Diabetes Survival Skills Education) will improve Outcome: Completed/Met

## 2023-10-03 NOTE — Progress Notes (Signed)
 [facility] Progress Note Patient: Calvin Schultz ZOX:[id number] DOB: 1950-07-09 DOA: 10/01/2023  DOS: the patient was seen and examined on 10/03/2023  Brief Hospital Course: PMH of CKD 5, chronic A-fib, T2DM, HLD, HTN, BPH presented to the hospital with complaints of worsening swelling of his legs. Was recently hospitalized for septic shock secondary to C. difficile colitis.  Lasix  was discontinued.  Presented to the hospital now with worsening swelling for last 6 weeks progressively worsening. Nephrology consulted. Apparently received IV diuresis.  Assessment and Plan: AKI on CKD stage V. Baseline serum creatinine appears to be around 5.6.  On admission serum creatinine was 4.5 Currently improving with diuresis.  Appreciate nephrology consultation. For now we will monitor.  Anasarca. Responding well to the diuresis so far.  Monitor.  Anemia of chronic kidney disease with acute worsening. Baseline hemoglobin 8.6.  On admission hemoglobin was 6.0.  Likely dilutional in nature due to volume overload. After 2 PRBC hemoglobin is 7.8.  Hemoglobin apparently dropped again 5/37 although on a recheck hemoglobin came back to 7.3 again. No active bleeding reported although Hemoccult was positive on admission. Stool was brown in color Continue PPI, changed to clear liquid diet. Transfuse for hemoglobin less than 8. Holding off on GI consultation although if the hemoglobin does not appropriately rise we will consult GI.  Bilateral heel ulcer. Outpatient podiatry follow-up recommended Appreciate wound care in the hospital.  HTN. Continue home blood pressure medication for now but holding Norvasc .  BPH. Flomax .  Will continue.  Chronic muscle spasm. On Zanaflex .  Needed.  Metabolic acidosis.   In setting of CKD. On bicarb.  Monitor.   Subjective: No nausea no vomiting no fever no chills.  Breathing okay.  Swelling of the leg improving.  Physical Exam: General: in Mild distress,  No Rash Cardiovascular: S1 and S2 Present, No Murmur Respiratory: Good respiratory effort, Bilateral Air entry present.  Basal crackles, No wheezes Abdomen: Bowel Sound present, No tenderness Extremities: Bilateral improving edema Neuro: Alert and oriented x3, no new focal deficit  Data Reviewed: I have Reviewed nursing notes, Vitals, and Lab results. Since last encounter, pertinent lab results CBC and BMP   . I have ordered test including CBC and BMP  .   Disposition: Status is: Inpatient Remains inpatient appropriate because: Monitor for improvement in hemoglobin and renal function and volume overload  Place TED hose Start: 10/01/23 1938   Family Communication: Wife at bedside Level of care: Telemetry Medical   Vitals:   10/03/23 0945 10/03/23 1030 10/03/23 1235 10/03/23 1700  BP: 120/60  129/73 133/67  Pulse: 80  82 68  Resp: 20 20 20 18   Temp: 98.2 F (36.8 C)  97.9 F (36.6 C) 97.6 F (36.4 C)  TempSrc: Oral  Oral Oral  SpO2: 98%  96% 97%  Weight:      Height:         Author: Charlean Congress, MD 10/03/2023 7:40 PM  Please look on [email or web address] to find out who is on call.

## 2023-10-04 DIAGNOSIS — R601 Generalized edema: Secondary | ICD-10-CM | POA: Diagnosis not present

## 2023-10-04 LAB — CBC
HCT: 25.6 % — ABNORMAL LOW (ref 39.0–52.0)
Hemoglobin: 8.2 g/dL — ABNORMAL LOW (ref 13.0–17.0)
MCH: 27.2 pg (ref 26.0–34.0)
MCHC: 32 g/dL (ref 30.0–36.0)
MCV: 84.8 fL (ref 80.0–100.0)
Platelets: 256 10*3/uL (ref 150–400)
RBC: 3.02 MIL/uL — ABNORMAL LOW (ref 4.22–5.81)
RDW: 17.8 % — ABNORMAL HIGH (ref 11.5–15.5)
WBC: 11.5 10*3/uL — ABNORMAL HIGH (ref 4.0–10.5)
nRBC: 0 % (ref 0.0–0.2)

## 2023-10-04 LAB — TYPE AND SCREEN
ABO/RH(D): O POS
Antibody Screen: NEGATIVE
Unit division: 0
Unit division: 0
Unit division: 0

## 2023-10-04 LAB — BPAM RBC
Blood Product Expiration Date: 202506082359
Blood Product Expiration Date: 202506242359
ISSUE DATE / TIME: 202505291835
ISSUE DATE / TIME: 202505300846
ISSUE DATE / TIME: 202506212359
ISSUING PHYSICIAN: 202505300846
PRODUCT CODE: 202505310858
PRODUCT CODE: 202506082359
Unit Type and Rh: 202506212359
Unit Type and Rh: 5100
Unit Type and Rh: 5100
Unit Type and Rh: 5100
Unit Type and Rh: 5100

## 2023-10-04 LAB — BASIC METABOLIC PANEL WITH GFR
Anion gap: 12 (ref 5–15)
BUN: 89 mg/dL — ABNORMAL HIGH (ref 8–23)
CO2: 21 mmol/L — ABNORMAL LOW (ref 22–32)
Calcium: 8.3 mg/dL — ABNORMAL LOW (ref 8.9–10.3)
Chloride: 105 mmol/L (ref 98–111)
Creatinine, Ser: 4.69 mg/dL — ABNORMAL HIGH (ref 0.61–1.24)
GFR, Estimated: 13 mL/min — ABNORMAL LOW (ref >60)
Glucose, Bld: 93 mg/dL (ref 70–99)
Potassium: 3.6 mmol/L (ref 3.5–5.1)
Sodium: 138 mmol/L (ref 135–145)

## 2023-10-04 LAB — MAGNESIUM: Magnesium: 1.9 mg/dL (ref 1.7–2.4)

## 2023-10-04 LAB — HAPTOGLOBIN: Haptoglobin: 278 mg/dL (ref 32–363)

## 2023-10-04 LAB — GLUCOSE, CAPILLARY
Glucose-Capillary: 92 mg/dL (ref 70–99)
Glucose-Capillary: 134 mg/dL — ABNORMAL HIGH (ref 70–99)
Glucose-Capillary: 188 mg/dL — ABNORMAL HIGH (ref 70–99)
Glucose-Capillary: 172 mg/dL — ABNORMAL HIGH (ref 70–99)

## 2023-10-04 NOTE — Plan of Care (Signed)
  Problem: Education: Goal: Individualized Educational Video(s) Outcome: Not Applicable   

## 2023-10-04 NOTE — Plan of Care (Signed)

## 2023-10-04 NOTE — Progress Notes (Signed)
 [facility] Progress Note Patient: Reuel Lamadrid VWU:[id number] DOB: 26-Jun-1950 DOA: 10/01/2023  DOS: the patient was seen and examined on 10/04/2023  Brief Hospital Course: PMH of CKD 5, chronic A-fib, T2DM, HLD, HTN, BPH presented to the hospital with complaints of worsening swelling of his legs. Was recently hospitalized for septic shock secondary to C. difficile colitis.  Lasix  was discontinued.  Presented to the hospital now with worsening swelling for last 6 weeks progressively worsening. Nephrology consulted. Apparently received IV diuresis.  Assessment and Plan: AKI on CKD stage V. Baseline serum creatinine appears to be around 5.6.  On admission serum creatinine was 4.5 Currently improving with diuresis.  Appreciate nephrology consultation. For now we will monitor.  Anasarca. Responding well to the diuresis so far.  Monitor.  Anemia of chronic kidney disease with acute worsening. Baseline hemoglobin 8.6.  On admission hemoglobin was 6.0.  Likely dilutional in nature due to volume overload. After 2 PRBC hemoglobin is 7.8.  Hemoglobin apparently dropped again 5/37 although on a recheck hemoglobin came back to 7.3 again. No active bleeding reported although Hemoccult was positive on admission. Stool was brown in color Continue PPI, changed to clear liquid diet. Transfuse for hemoglobin less than 8. Holding off on GI consultation although if the hemoglobin does not appropriately rise we will consult GI.  Bilateral heel ulcer. Outpatient podiatry follow-up recommended, referral placed. Appreciate wound care in the hospital.   HTN. Continue home blood pressure medication for now but holding Norvasc .  BPH. Flomax .  Will continue.  Chronic muscle spasm. On Zanaflex .  Needed.  Metabolic acidosis.   In setting of CKD. On bicarb.  Monitor.   Subjective: No nausea no vomiting no fever no chills no chest pain.  Physical Exam: General: in Mild distress, No  Rash Cardiovascular: S1 and S2 Present, No Murmur Respiratory: Good respiratory effort, Bilateral Air entry present. No Crackles, No wheezes Abdomen: Bowel Sound present, No tenderness Extremities: bilateral improving edema Neuro: Alert and oriented x3, no new focal deficit  Data Reviewed: I have Reviewed nursing notes, Vitals, and Lab results. Since last encounter, pertinent lab results CBC and BMP   . I have ordered test including CBC and BMP  .   Disposition: Status is: Inpatient Remains inpatient appropriate because: Monitor for improvement.  Place TED hose Start: 10/01/23 1938  Family Communication: Wife at bedside Level of care: Telemetry Medical   Vitals:   10/04/23 0720 10/04/23 0752 10/04/23 0832 10/04/23 0938  BP:  (!) 137/109 (!) 148/73   Pulse:  (!) 40 70   Resp:  18 20 18   Temp:  98 F (36.7 C) 98 F (36.7 C)   TempSrc:   Oral   SpO2:  97% 96%   Weight: 113 kg     Height:         Author: Charlean Congress, MD 10/04/2023 5:39 PM  Please look on [email or web address] to find out who is on call.

## 2023-10-04 NOTE — Progress Notes (Signed)
 [facility] Progress Note   Assessment/ Plan:    AKI on CKD V: Cr 5.8 4 2025, now down to 4.83 with sig volume overload             - IV Lasix  BID             - getting blood             - will need significant ongoing diuresis             - no need for HD at present--> if we can't get volume down otherwise we may need to consider  - continue Lasix  80 IV TID   2.  FOBT +: getting unit of blood; iron  studies OK, will do aranesp    3.  Acute on chronic CHF exac: last TTE 05/2023 OK, ? Diastolic component   4.  Nephrolithiasis: no active symptoms now   5.  Dispo: admitted  Subjective:    Hgb in the 8s, increased UOP and good diuresis.  Feeling a little better   Objective:   BP (!) 148/73 (BP Location: Left Arm)   Pulse 70   Temp 98 F (36.7 C) (Oral)   Resp 20   Ht 5\' 10"  (1.778 m)   Wt 113 kg   SpO2 96%   BMI 35.74 kg/m   Intake/Output Summary (Last 24 hours) at 10/04/2023 0948 Last data filed at 10/04/2023 0454 Gross per 24 hour  Intake 4456.5 ml  Output 3150 ml  Net 1306.5 ml   Weight change:   Physical Exam: GEN NAD HEENT eomi perrl NECK + JVD to angle of mandible PULM SOB, clear anteriorly CV RRR ABD soft EXT 3+ anasarca, improving NEURO AAO x 3 SKIN some weeping  Imaging: No results found.   Labs: BMET Recent Labs  Lab 10/01/23 1543 10/02/23 0628 10/03/23 0336 10/04/23 0350  NA 138 137 136 138  K 3.9 3.9 3.5 3.6  CL 108 110 108 105  CO2 19* 17* 18* 21*  GLUCOSE 116* 94 99 93  BUN 91* 89* 90* 89*  CREATININE 4.55* 4.38* 4.66* 4.69*  CALCIUM  9.0 8.7* 7.8* 8.3*  PHOS  --  7.0*  --   --    CBC Recent Labs  Lab 10/01/23 1543 10/02/23 0628 10/02/23 1142 10/02/23 1145 10/03/23 0336 10/03/23 0906 10/03/23 1638 10/04/23 0350  WBC 13.0*   < > 13.6*  --  12.5* 13.2* 12.9* 11.5*  NEUTROABS 10.8*  --  11.3*  --   --   --   --   --   HGB 6.0*   < > 7.8*   < > 6.8* 7.3* 8.4* 8.2*  HCT 20.0*   < > 25.1*   < > 21.9* 23.2* 26.6*  25.6*  MCV 86.6   < > 88.4  --  86.6 87.2 85.0 84.8  PLT 340   < > 314  --  271 280 272 256   < > = values in this interval not displayed.    Medications:     sodium chloride    Intravenous Once   darbepoetin (ARANESP ) injection - NON-DIALYSIS  100 mcg Subcutaneous Q Sat-1800   furosemide   80 mg Intravenous TID   insulin  aspart  0-9 Units Subcutaneous TID WC   pantoprazole  (PROTONIX ) IV  40 mg Intravenous Q12H   sodium bicarbonate   1,300 mg Oral TID   tamsulosin   0.4 mg Oral QPC supper    Leandra Pro MD 10/04/2023, 9:48 AM

## 2023-10-05 DIAGNOSIS — R601 Generalized edema: Secondary | ICD-10-CM | POA: Diagnosis not present

## 2023-10-05 LAB — CBC
HCT: 26.3 % — ABNORMAL LOW (ref 39.0–52.0)
Hemoglobin: 8.1 g/dL — ABNORMAL LOW (ref 13.0–17.0)
MCH: 26.6 pg (ref 26.0–34.0)
MCHC: 30.8 g/dL (ref 30.0–36.0)
MCV: 86.5 fL (ref 80.0–100.0)
Platelets: 245 10*3/uL (ref 150–400)
RBC: 3.04 MIL/uL — ABNORMAL LOW (ref 4.22–5.81)
RDW: 17.7 % — ABNORMAL HIGH (ref 11.5–15.5)
WBC: 11.9 10*3/uL — ABNORMAL HIGH (ref 4.0–10.5)
nRBC: 0 % (ref 0.0–0.2)

## 2023-10-05 LAB — BASIC METABOLIC PANEL WITH GFR
Anion gap: 12 (ref 5–15)
BUN: 89 mg/dL — ABNORMAL HIGH (ref 8–23)
CO2: 21 mmol/L — ABNORMAL LOW (ref 22–32)
Calcium: 8 mg/dL — ABNORMAL LOW (ref 8.9–10.3)
Chloride: 104 mmol/L (ref 98–111)
Creatinine, Ser: 4.78 mg/dL — ABNORMAL HIGH (ref 0.61–1.24)
GFR, Estimated: 12 mL/min — ABNORMAL LOW (ref >60)
Glucose, Bld: 99 mg/dL (ref 70–99)
Potassium: 3.1 mmol/L — ABNORMAL LOW (ref 3.5–5.1)
Sodium: 137 mmol/L (ref 135–145)

## 2023-10-05 LAB — GLUCOSE, CAPILLARY
Glucose-Capillary: 103 mg/dL — ABNORMAL HIGH (ref 70–99)
Glucose-Capillary: 183 mg/dL — ABNORMAL HIGH (ref 70–99)
Glucose-Capillary: 199 mg/dL — ABNORMAL HIGH (ref 70–99)
Glucose-Capillary: 204 mg/dL — ABNORMAL HIGH (ref 70–99)

## 2023-10-05 LAB — VITAMIN B12: Vitamin B-12: 635 pg/mL (ref 180–914)

## 2023-10-05 LAB — MAGNESIUM: Magnesium: 1.9 mg/dL (ref 1.7–2.4)

## 2023-10-05 MED ORDER — POTASSIUM CHLORIDE CRYS ER 20 MEQ PO TBCR
40.0000 meq | EXTENDED_RELEASE_TABLET | Freq: Once | ORAL | Status: AC
  Administered 2023-10-05: 40 meq via ORAL
  Filled 2023-10-05: qty 2

## 2023-10-05 MED ORDER — PANTOPRAZOLE SODIUM 40 MG PO TBEC
40.0000 mg | DELAYED_RELEASE_TABLET | Freq: Two times a day (BID) | ORAL | Status: DC
  Administered 2023-10-05 – 2023-10-15 (×20): 40 mg via ORAL
  Filled 2023-10-05 (×21): qty 1

## 2023-10-05 NOTE — Plan of Care (Signed)

## 2023-10-05 NOTE — NC FL2 (Signed)
 [place]  MEDICAID FL2 LEVEL OF CARE FORM     IDENTIFICATION  Patient Name: Calvin Schultz Birthdate: 1951-01-29 Sex: male Admission Date (Current Location): 10/01/2023  [facility] and [place] Number:  Producer, television/film/video and Address:  [facility], [address] [place], [place] [address]      Provider Number: [id number]  Attending Physician Name and Address:  Kraig Peru, MD  Relative Name and Phone Number:  Treyshawn, Muldrew (Spouse)  [phone] (Mobile)    Current Level of Care: Hospital Recommended Level of Care: Skilled Nursing Facility Prior Approval Number:    Date Approved/Denied:   PASRR Number: [id number]  Discharge Plan: SNF    Current Diagnoses: Patient Active Problem List   Diagnosis Date Noted   Chronic kidney disease (CKD), stage V (HCC) 10/02/2023   Anasarca 10/01/2023   Malnutrition of moderate degree 08/17/2023   Longstanding persistent atrial fibrillation (HCC) 08/14/2023   Typical atrial flutter (HCC) 08/14/2023   Acute blood loss anemia 08/14/2023   Gross hematuria 08/14/2023   Hypokalemia 08/11/2023   Hypomagnesemia 08/11/2023   Depression with anxiety 08/10/2023   Acute cystitis with hematuria 08/07/2023   C. difficile diarrhea 08/07/2023   Right ureteral calculus 08/05/2023   Pressure injury of skin 08/05/2023   Leukocytosis 08/05/2023   History of Clostridium difficile colitis 08/05/2023   Controlled diabetes mellitus type 2 with complications (HCC) 08/05/2023   Debility 07/10/2023   Clostridium difficile colitis 07/03/2023   Acute renal failure superimposed on chronic kidney disease (HCC) 06/26/2023   Elevated PSA 05/31/2023   Nephrolithiasis 05/31/2023   Bilateral lower extremity edema 05/31/2023   Chronic atrial fibrillation (HCC) 05/31/2023   Normocytic anemia 05/31/2023   Metabolic acidosis 05/31/2023   Abnormal urinalysis 05/31/2023   Proteinuria 05/31/2023   Acute kidney injury superimposed on  chronic kidney disease (HCC) 05/30/2023    Orientation RESPIRATION BLADDER Height & Weight     Self, Time, Situation, Place  Normal Continent Weight: 249 lb 1.9 oz (113 kg) Height:  5\' 10"  (177.8 cm)  BEHAVIORAL SYMPTOMS/MOOD NEUROLOGICAL BOWEL NUTRITION STATUS      Incontinent Diet (see d/c summary)  AMBULATORY STATUS COMMUNICATION OF NEEDS Skin   Extensive Assist Verbally PU Stage and Appropriate Care (Pressure injury right heel; deep tissue pressure injury left heel)                       Personal Care Assistance Level of Assistance  Bathing, Feeding, Dressing Bathing Assistance: Maximum assistance Feeding assistance: Independent Dressing Assistance: Limited assistance     Functional Limitations Info  Sight, Hearing, Speech Sight Info: Adequate Hearing Info: Adequate Speech Info: Adequate    SPECIAL CARE FACTORS FREQUENCY  OT (By licensed OT), PT (By licensed PT)     PT Frequency: 5x/week OT Frequency: 5x/week            Contractures Contractures Info: Not present    Additional Factors Info  Code Status, Allergies Code Status Info: full code Allergies Info: known allergies           Current Medications (10/05/2023):  This is the current hospital active medication list Current Facility-Administered Medications  Medication Dose Route Frequency Provider Last Rate Last Admin   0.9 %  sodium chloride  infusion (Manually program via Guardrails IV Fluids)   Intravenous Once Amponsah, Prosper M, MD   Held at 10/01/23 1933   acetaminophen  (TYLENOL ) tablet 650 mg  650 mg Oral Q6H PRN Amponsah, Prosper M, MD  Or   acetaminophen  (TYLENOL ) suppository 650 mg  650 mg Rectal Q6H PRN Vita Grip, MD       Darbepoetin Alfa  (ARANESP ) injection 100 mcg  100 mcg Subcutaneous Q Sat-1800 Leandra Pro, MD   100 mcg at 10/03/23 1740   furosemide  (LASIX ) injection 80 mg  80 mg Intravenous TID Leandra Pro, MD   80 mg at 10/05/23 1610   insulin  aspart  (novoLOG ) injection 0-9 Units  0-9 Units Subcutaneous TID WC Amponsah, Prosper M, MD   2 Units at 10/05/23 1132   ondansetron  (ZOFRAN ) tablet 4 mg  4 mg Oral Q6H PRN Amponsah, Prosper M, MD       Or   ondansetron  (ZOFRAN ) injection 4 mg  4 mg Intravenous Q6H PRN Amponsah, Prosper M, MD       pantoprazole  (PROTONIX ) EC tablet 40 mg  40 mg Oral BID Powell, Lisa K, [facility]       sodium bicarbonate  tablet 1,300 mg  1,300 mg Oral TID Patel, Pranav M, MD   1,300 mg at 10/05/23 9604   tamsulosin  (FLOMAX ) capsule 0.4 mg  0.4 mg Oral QPC supper Patel, Pranav M, MD   0.4 mg at 10/04/23 1718   tiZANidine  (ZANAFLEX ) tablet 4 mg  4 mg Oral BID PRN Patel, Pranav M, MD       traZODone  (DESYREL ) tablet 50 mg  50 mg Oral QHS PRN Patel, Pranav M, MD   50 mg at 10/03/23 2111     Discharge Medications: Please see discharge summary for a list of discharge medications.  Relevant Imaging Results:  Relevant Lab Results:   Additional Information SSN [id number] [place], [place]

## 2023-10-05 NOTE — Evaluation (Signed)
 Physical Therapy Evaluation Patient Details Name: Calvin Schultz MRN: [id number] DOB: [birth year] Today's Date: 10/05/2023  History of Present Illness  73 y.o. male who presents on 10/01/23 with worsening body edema and B foot wounds. PMH:  A-fib, CKD stage V, T2DM, HLD, HTN and BPH  Clinical Impression  Pt admitted with above diagnosis. Pt from home, family using hoyer to get him from hospital bed to w/c. Pt would like to be able to stand again. Patient will benefit from continued inpatient follow up therapy, <3 hours/day to help pt progress mobility before returning home. On eval, pt needed max A +2 for bed mobility and was unable to attempt standing due to swelling and tightness in knees preventing him from being able to get his feet under him.   Pt currently with functional limitations due to the deficits listed below (see PT Problem List). Pt will benefit from acute skilled PT to increase their independence and safety with mobility to allow discharge.           If plan is discharge home, recommend the following: Two people to help with walking and/or transfers;Two people to help with bathing/dressing/bathroom;Assist for transportation;Assistance with cooking/housework   Can travel by private vehicle   No    Equipment Recommendations None recommended by PT  Recommendations for Other Services       Functional Status Assessment Patient has had a recent decline in their functional status and demonstrates the ability to make significant improvements in function in a reasonable and predictable amount of time.     Precautions / Restrictions Precautions Precautions: Fall Restrictions Weight Bearing Restrictions Per Provider Order: No      Mobility  Bed Mobility Overal bed mobility: Needs Assistance Bed Mobility: Rolling, Sidelying to Sit, Sit to Supine Rolling: Max assist Sidelying to sit: Max assist, +2 for physical assistance   Sit to supine: Max assist, +2 for physical  assistance   General bed mobility comments: unable to bring LE's off bed. Can assist with trunk with use of rail and HHA.    Transfers                   General transfer comment: brought stedy for pt to attempt stand but could not get R knee flexed enough to be able to use LE's to attempt standing    Ambulation/Gait               General Gait Details: unable at baseline  Stairs            Wheelchair Mobility     Tilt Bed    Modified Rankin (Stroke Patients Only)       Balance Overall balance assessment: Needs assistance Sitting-balance support: Feet supported, Bilateral upper extremity supported Sitting balance-Leahy Scale: Fair         Standing balance comment: unable                             Pertinent Vitals/Pain Pain Assessment Pain Assessment: Faces Faces Pain Scale: Hurts little more Pain Location: R shoulder, back Pain Descriptors / Indicators: Aching, Sore, Grimacing Pain Intervention(s): Limited activity within patient's tolerance, Monitored during session    Home Living Family/patient expects to be discharged to:: Private residence Living Arrangements: Spouse/significant other;Children Available Help at Discharge: Family;Available 24 hours/day Type of Home: House Home Access: Ramped entrance     Alternate Level Stairs-Number of Steps: 4 stair at the front of the home  with landing and a rise of 5 to 6 inches into the house Home Layout: Two level;Able to live on main level with bedroom/bathroom Home Equipment: Rolling Walker (2 wheels);Rollator (4 wheels);Cane - single point;BSC/3in1;Shower seat;Grab bars - toilet;Wheelchair - manual;Hospital bed;Other (comment) (hoyer) Additional Comments: Hoyer lift for OOB to wheelchair.    Prior Function Prior Level of Function : Needs assist       Physical Assist : Mobility (physical);ADLs (physical) Mobility (physical): Bed mobility;Transfers ADLs (physical):  Bathing;Dressing;Toileting;IADLs Mobility Comments: Patient states hoyer lift from hospital bed to wheelchair.  Patient has not been able to stand. ADLs Comments: Spouse assists with bedlevel and wheelchair level bathing/dressing.  Bedpan at home.     Extremity/Trunk Assessment   Upper Extremity Assessment Upper Extremity Assessment: Defer to OT evaluation    Lower Extremity Assessment Lower Extremity Assessment: Generalized weakness;RLE deficits/detail;LLE deficits/detail RLE Deficits / Details: RLE skin very tight and swollen. Donned compression stocking B. R knee flex limited by tightness, cannot bend to 90 deg in sitting. Hip flex 1/5. Knee ext 3-/5 RLE Sensation: decreased proprioception RLE Coordination: decreased gross motor LLE Deficits / Details: not as swollen as RLE. L knee flex also limited but can reach 90 deg with assist. hip flex 2-/5, knee ext 3-/5 LLE Sensation: decreased proprioception LLE Coordination: decreased gross motor    Cervical / Trunk Assessment Cervical / Trunk Assessment: Kyphotic;Other exceptions Cervical / Trunk Exceptions: Body Habitus  Communication   Communication Communication: No apparent difficulties    Cognition Arousal: Alert Behavior During Therapy: WFL for tasks assessed/performed   PT - Cognitive impairments: No apparent impairments                         Following commands: Intact       Cueing Cueing Techniques: Verbal cues     General Comments General comments (skin integrity, edema, etc.): VSS on RA    Exercises General Exercises - Lower Extremity Ankle Circles/Pumps: AROM, Both, 10 reps, Supine, Seated Gluteal Sets: AROM, 10 reps, Seated Long Arc Quad: AROM, Both, 10 reps, Seated Heel Slides: AROM, Both, 5 reps, Supine Hip Flexion/Marching: AROM, Both, 5 reps, Seated Other Exercises Other Exercises: pulling on bar of steady x10 Other Exercises: scapular retraction x10 Other Exercises: pushing therapist  away and then pulling towards x10 Other Exercises: working on tolerating bed close to flat for 2 mins to stretch hip flexors   Assessment/Plan    PT Assessment Patient needs continued PT services  PT Problem List Decreased strength;Decreased range of motion;Decreased activity tolerance;Decreased mobility;Obesity;Pain;Decreased balance       PT Treatment Interventions DME instruction;Functional mobility training;Therapeutic activities;Therapeutic exercise;Balance training;Neuromuscular re-education;Patient/family education    PT Goals (Current goals can be found in the Care Plan section)  Acute Rehab PT Goals Patient Stated Goal: return home PT Goal Formulation: With patient Time For Goal Achievement: 10/19/23 Potential to Achieve Goals: Fair    Frequency Min 1X/week     Co-evaluation               AM-PAC PT "6 Clicks" Mobility  Outcome Measure Help needed turning from your back to your side while in a flat bed without using bedrails?: A Lot Help needed moving from lying on your back to sitting on the side of a flat bed without using bedrails?: A Lot Help needed moving to and from a bed to a chair (including a wheelchair)?: Total Help needed standing up from a chair using your arms (e.g.,  wheelchair or bedside chair)?: Total Help needed to walk in hospital room?: Total Help needed climbing 3-5 steps with a railing? : Total 6 Click Score: 8    End of Session Equipment Utilized During Treatment: Gait belt Activity Tolerance: Patient limited by fatigue Patient left: in bed;with call bell/phone within reach Nurse Communication: Mobility status PT Visit Diagnosis: Muscle weakness (generalized) (M62.81);Pain Pain - Right/Left: Right Pain - part of body: Shoulder (back)    Time: 0865-7846 PT Time Calculation (min) (ACUTE ONLY): 35 min   Charges:   PT Evaluation $PT Eval Moderate Complexity: 1 Mod PT Treatments $Therapeutic Activity: 8-22 mins PT General Charges $$  ACUTE PT VISIT: 1 Visit         Amey Ka, PT  Acute Rehab Services Secure chat preferred Office [phone]   Deloris Fetters Natacia Chaisson 10/05/2023, 11:33 AM

## 2023-10-05 NOTE — Progress Notes (Signed)
 [facility] Progress Note   Assessment/ Plan:    AKI on CKD V: Cr 5.8 4 2025, now down to 4.8 with volume overload             - will need ongoing diuresis - cont lasix  80 IV TID today             - no need for HD at presen  -cont daily labs, strict I/Os, low na diet   2.  FOBT +: s/p transfusion; iron  studies OK, started aranesp  5/31 weekly   3.  Acute on chronic CHF exac: last TTE 05/2023 OK, ? Diastolic component   4.  Nephrolithiasis: no active symptoms now   5.  Dispo: admitted  Subjective:    Tired after PT today. No new issues.  Wife at work, not at bedside UOP 3.4L yesterday on lasix  80 TID   Objective:   BP 120/67 (BP Location: Left Arm)   Pulse 73   Temp 97.6 F (36.4 C)   Resp 18   Ht 5\' 10"  (1.778 m)   Wt 113 kg   SpO2 100%   BMI 35.74 kg/m   Intake/Output Summary (Last 24 hours) at 10/05/2023 1153 Last data filed at 10/05/2023 0600 Gross per 24 hour  Intake 960 ml  Output 2440 ml  Net -1480 ml   Weight change:   Physical Exam: GEN NAD HEENT eomi perrl PULM  normal WOB,  clear anteriorly CV RRR ABD soft EXT 2+ anasarca, improving NEURO AAO x 3 SKIN some weeping  Imaging: No results found.   Labs: BMET Recent Labs  Lab 10/01/23 1543 10/02/23 0628 10/03/23 0336 10/04/23 0350 10/05/23 0432  NA 138 137 136 138 137  K 3.9 3.9 3.5 3.6 3.1*  CL 108 110 108 105 104  CO2 19* 17* 18* 21* 21*  GLUCOSE 116* 94 99 93 99  BUN 91* 89* 90* 89* 89*  CREATININE 4.55* 4.38* 4.66* 4.69* 4.78*  CALCIUM  9.0 8.7* 7.8* 8.3* 8.0*  PHOS  --  7.0*  --   --   --    CBC Recent Labs  Lab 10/01/23 1543 10/02/23 0628 10/02/23 1142 10/02/23 1145 10/03/23 0906 10/03/23 1638 10/04/23 0350 10/05/23 0432  WBC 13.0*   < > 13.6*   < > 13.2* 12.9* 11.5* 11.9*  NEUTROABS 10.8*  --  11.3*  --   --   --   --   --   HGB 6.0*   < > 7.8*   < > 7.3* 8.4* 8.2* 8.1*  HCT 20.0*   < > 25.1*   < > 23.2* 26.6* 25.6* 26.3*  MCV 86.6   < > 88.4   < > 87.2  85.0 84.8 86.5  PLT 340   < > 314   < > 280 272 256 245   < > = values in this interval not displayed.    Medications:     sodium chloride    Intravenous Once   darbepoetin (ARANESP ) injection - NON-DIALYSIS  100 mcg Subcutaneous Q Sat-1800   furosemide   80 mg Intravenous TID   insulin  aspart  0-9 Units Subcutaneous TID WC   pantoprazole   40 mg Oral BID   sodium bicarbonate   1,300 mg Oral TID   tamsulosin   0.4 mg Oral QPC supper

## 2023-10-05 NOTE — TOC Initial Note (Signed)
 Transition of Care [facility]) - Initial/Assessment Note    Patient Details  Name: Calvin Schultz MRN: [id number] Date of Birth: 1950-11-04  [facility]) CM/SW Contact:    Katrinka Parr, LCSW Phone Number: 10/05/2023, 1:43 PM  Clinical Narrative:                  CSW met with pt to discuss SNF rec. Pt agreeable to SNF w/u. CSW explained workup and insurance auth process. Fl2 completed and bed requests sent in hub. \TOC to follow up with SNF bed offers.   Expected Discharge Plan: Skilled Nursing Facility Barriers to Discharge: Continued Medical Work up, English as a second language teacher, SNF Pending bed offer           Expected Discharge Plan and Services       Living arrangements for the past 2 months: Single Family Home                                      Prior Living Arrangements/Services Living arrangements for the past 2 months: Single Family Home Lives with:: Spouse Patient language and need for interpreter reviewed:: Yes        Need for Family Participation in Patient Care: No (Comment) Care giver support system in place?: Yes (comment)   Criminal Activity/Legal Involvement Pertinent to Current Situation/Hospitalization: No - Comment as needed  Activities of Daily Living   ADL Screening (condition at time of admission) Independently performs ADLs?: No Does the patient have a NEW difficulty with bathing/dressing/toileting/self-feeding that is expected to last >3 days?: Yes (Initiates electronic notice to provider for possible OT consult) Does the patient have a NEW difficulty with getting in/out of bed, walking, or climbing stairs that is expected to last >3 days?: Yes (Initiates electronic notice to provider for possible PT consult) Does the patient have a NEW difficulty with communication that is expected to last >3 days?: No Is the patient deaf or have difficulty hearing?: No Does the patient have difficulty seeing, even when wearing glasses/contacts?:  No Does the patient have difficulty concentrating, remembering, or making decisions?: No  Permission Sought/Granted                  Emotional Assessment Appearance:: Appears stated age Attitude/Demeanor/Rapport: Engaged Affect (typically observed): Accepting Orientation: : Oriented to Self, Oriented to Place, Oriented to  Time, Oriented to Situation Alcohol / Substance Use: Not Applicable Psych Involvement: No (comment)  Admission diagnosis:  Anasarca [R60.1] Patient Active Problem List   Diagnosis Date Noted   Chronic kidney disease (CKD), stage V (HCC) 10/02/2023   Anasarca 10/01/2023   Malnutrition of moderate degree 08/17/2023   Longstanding persistent atrial fibrillation (HCC) 08/14/2023   Typical atrial flutter (HCC) 08/14/2023   Acute blood loss anemia 08/14/2023   Gross hematuria 08/14/2023   Hypokalemia 08/11/2023   Hypomagnesemia 08/11/2023   Depression with anxiety 08/10/2023   Acute cystitis with hematuria 08/07/2023   C. difficile diarrhea 08/07/2023   Right ureteral calculus 08/05/2023   Pressure injury of skin 08/05/2023   Leukocytosis 08/05/2023   History of Clostridium difficile colitis 08/05/2023   Controlled diabetes mellitus type 2 with complications (HCC) 08/05/2023   Debility 07/10/2023   Clostridium difficile colitis 07/03/2023   Acute renal failure superimposed on chronic kidney disease (HCC) 06/26/2023   Elevated PSA 05/31/2023   Nephrolithiasis 05/31/2023   Bilateral lower extremity edema 05/31/2023   Chronic atrial fibrillation (  HCC) 05/31/2023   Normocytic anemia 05/31/2023   Metabolic acidosis 05/31/2023   Abnormal urinalysis 05/31/2023   Proteinuria 05/31/2023   Acute kidney injury superimposed on chronic kidney disease (HCC) 05/30/2023   PCP:  Audria Leather, MD Pharmacy:   [facility] [phone] Jonette Nestle, [place] - [address] [place] [address] Phone: [phone] Fax: [phone]  [facility] [address] [place] [address] Phone: [phone] Fax: [phone]     Social Drivers of Health (SDOH) Social History: SDOH Screenings   Food Insecurity: No Food Insecurity (10/02/2023)  Housing: Low Risk  (10/02/2023)  Transportation Needs: No Transportation Needs (10/02/2023)  Utilities: Not At Risk (10/02/2023)  Social Connections: Moderately Isolated (10/02/2023)  Tobacco Use: Low Risk  (10/02/2023)   SDOH Interventions:     Readmission Risk Interventions    07/10/2023   11:21 AM 06/27/2023    1:00 PM  Readmission Risk Prevention Plan  Transportation Screening Complete Complete  PCP or Specialist Appt within 5-7 Days  Complete  PCP or Specialist Appt within 3-5 Days Complete   Home Care Screening  Complete  Medication Review (RN CM)  Complete  HRI or Home Care Consult Complete   Social Work Consult for Recovery Care Planning/Counseling Complete   Palliative Care Screening Not Applicable   Medication Review Oceanographer) Referral to Pharmacy

## 2023-10-05 NOTE — Progress Notes (Signed)
 [facility] Progress Note Patient: Calvin Schultz WUJ:[id number] DOB: 1951-03-02 DOA: 10/01/2023  DOS: the patient was seen and examined on 10/05/2023  Brief Hospital Course: PMH of CKD 5, chronic A-fib, T2DM, HLD, HTN, BPH presented to the hospital with complaints of worsening swelling of his legs. Was recently hospitalized for septic shock secondary to C. difficile colitis.  Lasix  was discontinued.  Presented to the hospital now with worsening swelling for last 6 weeks progressively worsening. Nephrology consulted. Apparently received IV diuresis.  Assessment and Plan: CKD stage V. Acute on chronic diastolic CHF Baseline serum creatinine appears to be around 5.6.  On admission serum creatinine was 4.5 Edema currently improving with diuresis.  But very slowly Appreciate nephrology consultation. For now we will monitor.  Anasarca. Responding well to the diuresis so far.  But slowly. Monitor.  Anemia of chronic kidney disease with acute worsening. Baseline hemoglobin 8.6.  On admission hemoglobin was 6.0.  Likely dilutional in nature due to volume overload. After 2 PRBC hemoglobin is 7.8.  Hemoglobin apparently dropped again 5/37 although on a recheck hemoglobin came back to 7.3 again. No active bleeding reported although Hemoccult was positive on admission. Stool was brown in color Continue PPI, changed to clear liquid diet. Transfuse for hemoglobin less than 8. Holding off on GI consultation although if the hemoglobin does not appropriately rise we will consult GI.  Bilateral heel ulcer. Outpatient podiatry follow-up recommended, referral placed. Appreciate wound care in the hospital.   HTN. Continue home blood pressure medication for now but holding Norvasc .  BPH. Flomax .  Will continue.  Chronic muscle spasm. On Zanaflex .  Needed.  Metabolic acidosis.   In setting of CKD. On bicarb.  Monitor.   Subjective:   No nausea no vomiting..  Swelling in the legs seen.  No  shortness of breath.  No chest pain.  Physical Exam: General: in Mild distress, No Rash Cardiovascular: S1 and S2 Present, No Murmur Respiratory: Good respiratory effort, Bilateral Air entry present.  Basal crackles, No wheezes Abdomen: Bowel Sound present, No tenderness Extremities: Bilateral edema Neuro: Alert and oriented x3, no new focal deficit  Data Reviewed: I have Reviewed nursing notes, Vitals, and Lab results. Since last encounter, pertinent lab results CBC and BMP   . I have ordered test including CBC and BMP  .   Disposition: Status is: Inpatient Remains inpatient appropriate because: Monitor for improvement in renal function  Place TED hose Start: 10/01/23 1938   Family Communication: No one at bedside Level of care: Telemetry Medical   Vitals:   10/05/23 0830 10/05/23 0900 10/05/23 1631 10/05/23 1725  BP:   118/63 109/72  Pulse:   (!) 47 78  Resp: 15 17 18 14   Temp:   (!) 97.5 F (36.4 C) 97.7 F (36.5 C)  TempSrc:    Oral  SpO2:   98% 94%  Weight:      Height:         Author: Charlean Congress, MD 10/05/2023 7:14 PM  Please look on [email or web address] to find out who is on call.

## 2023-10-06 DIAGNOSIS — R601 Generalized edema: Secondary | ICD-10-CM | POA: Diagnosis not present

## 2023-10-06 LAB — CBC
HCT: 25.8 % — ABNORMAL LOW (ref 39.0–52.0)
Hemoglobin: 8.2 g/dL — ABNORMAL LOW (ref 13.0–17.0)
MCH: 27.5 pg (ref 26.0–34.0)
MCHC: 31.8 g/dL (ref 30.0–36.0)
MCV: 86.6 fL (ref 80.0–100.0)
Platelets: 252 10*3/uL (ref 150–400)
RBC: 2.98 MIL/uL — ABNORMAL LOW (ref 4.22–5.81)
RDW: 17.7 % — ABNORMAL HIGH (ref 11.5–15.5)
WBC: 13 10*3/uL — ABNORMAL HIGH (ref 4.0–10.5)
nRBC: 0 % (ref 0.0–0.2)

## 2023-10-06 LAB — BASIC METABOLIC PANEL WITH GFR
Anion gap: 11 (ref 5–15)
BUN: 95 mg/dL — ABNORMAL HIGH (ref 8–23)
CO2: 22 mmol/L (ref 22–32)
Calcium: 8.4 mg/dL — ABNORMAL LOW (ref 8.9–10.3)
Chloride: 106 mmol/L (ref 98–111)
Creatinine, Ser: 4.94 mg/dL — ABNORMAL HIGH (ref 0.61–1.24)
GFR, Estimated: 12 mL/min — ABNORMAL LOW (ref >60)
Glucose, Bld: 136 mg/dL — ABNORMAL HIGH (ref 70–99)
Potassium: 3.4 mmol/L — ABNORMAL LOW (ref 3.5–5.1)
Sodium: 139 mmol/L (ref 135–145)

## 2023-10-06 LAB — MAGNESIUM: Magnesium: 1.8 mg/dL (ref 1.7–2.4)

## 2023-10-06 LAB — GLUCOSE, CAPILLARY
Glucose-Capillary: 142 mg/dL — ABNORMAL HIGH (ref 70–99)
Glucose-Capillary: 169 mg/dL — ABNORMAL HIGH (ref 70–99)
Glucose-Capillary: 159 mg/dL — ABNORMAL HIGH (ref 70–99)
Glucose-Capillary: 177 mg/dL — ABNORMAL HIGH (ref 70–99)

## 2023-10-06 MED ORDER — ALBUMIN HUMAN 25 % IV SOLN
25.0000 g | Freq: Four times a day (QID) | INTRAVENOUS | Status: AC
  Administered 2023-10-06 (×2): 25 g via INTRAVENOUS
  Filled 2023-10-06 (×2): qty 100

## 2023-10-06 MED ORDER — ALBUMIN HUMAN 25 % IV SOLN: 12.5000 g | Freq: Once | INTRAVENOUS | Status: DC

## 2023-10-06 MED ORDER — FUROSEMIDE 10 MG/ML IJ SOLN
80.0000 mg | Freq: Two times a day (BID) | INTRAMUSCULAR | Status: DC
  Administered 2023-10-06 – 2023-10-07 (×3): 80 mg via INTRAVENOUS
  Filled 2023-10-06 (×3): qty 8

## 2023-10-06 MED ORDER — HEPARIN SODIUM (PORCINE) 5000 UNIT/ML IJ SOLN
5000.0000 [IU] | Freq: Three times a day (TID) | INTRAMUSCULAR | Status: DC
  Administered 2023-10-06 – 2023-10-16 (×28): 5000 [IU] via SUBCUTANEOUS
  Filled 2023-10-06 (×24): qty 1

## 2023-10-06 NOTE — TOC Progression Note (Signed)
 Transition of Care [facility]) - Progression Note    Patient Details  Name: Calvin Schultz MRN: [id number] Date of Birth: 04-02-1951  [facility]) CM/SW Contact  Katrinka Parr, [place] Phone Number: 10/06/2023, 11:53 AM  Clinical Narrative:     CSW provided pt with SNF bed offers and medicare star ratings. He states he will review with his wife; she is coming to hospital later today. TOC will follow up for SNF choice.   Expected Discharge Plan: Skilled Nursing Facility Barriers to Discharge: Continued Medical Work up, English as a second language teacher, SNF Pending bed offer  Expected Discharge Plan and Services       Living arrangements for the past 2 months: Single Family Home                                       Social Determinants of Health (SDOH) Interventions SDOH Screenings   Food Insecurity: No Food Insecurity (10/02/2023)  Housing: Low Risk  (10/02/2023)  Transportation Needs: No Transportation Needs (10/02/2023)  Utilities: Not At Risk (10/02/2023)  Social Connections: Moderately Isolated (10/02/2023)  Tobacco Use: Low Risk  (10/02/2023)    Readmission Risk Interventions    07/10/2023   11:21 AM 06/27/2023    1:00 PM  Readmission Risk Prevention Plan  Transportation Screening Complete Complete  PCP or Specialist Appt within 5-7 Days  Complete  PCP or Specialist Appt within 3-5 Days Complete   Home Care Screening  Complete  Medication Review (RN CM)  Complete  HRI or Home Care Consult Complete   Social Work Consult for Recovery Care Planning/Counseling Complete   Palliative Care Screening Not Applicable   Medication Review Oceanographer) Referral to Pharmacy

## 2023-10-06 NOTE — Progress Notes (Signed)
 [facility] Progress Note Patient: Calvin Schultz ZDG:[id number] DOB: [birth year] DOA: 10/01/2023  DOS: the patient was seen and examined on 10/06/2023  Brief Hospital Course: PMH of CKD 5, chronic A-fib, T2DM, HLD, HTN, BPH presented to the hospital with complaints of worsening swelling of his legs. Was recently hospitalized for septic shock secondary to C. difficile colitis.  Lasix  was discontinued.  Presented to the hospital now with worsening swelling for last 6 weeks progressively worsening. Nephrology consulted. Apparently received IV diuresis.  Assessment and Plan: CKD stage V. Acute on chronic diastolic CHF Anasarca. Hypoalbuminemia with third spacing. Baseline serum creatinine appears to be around 5.6.  On admission serum creatinine was 4.5 Edema currently improving with diuresis.  But very slowly Appreciate nephrology consultation. For now we will monitor. Responding well to the diuresis so far.  But slowly. Will add IV albumin . Monitor.  Anemia of chronic kidney disease with acute worsening. Baseline hemoglobin 8.6.  On admission hemoglobin was 6.0.  Likely dilutional in nature due to volume overload. After 2 PRBC hemoglobin is 7.8.  Hemoglobin apparently dropped again 5/37 although on a recheck hemoglobin came back to 7.3 again. No active bleeding reported although Hemoccult was positive on admission. Stool was brown in color Transfuse for hemoglobin less than 8. H&H stable.  Bilateral heel ulcer. Outpatient podiatry follow-up recommended, referral placed. Appreciate wound care in the hospital.   HTN. Continue home blood pressure medication for now but holding Norvasc .  BPH. Flomax .  Will continue.  Chronic muscle spasm. On Zanaflex .  Needed.  Metabolic acidosis.   In setting of CKD. On bicarb.  Monitor.   Subjective: No nausea no vomiting no fever no chills.  Physical Exam: General: in Mild distress, No Rash Cardiovascular: S1 and S2 Present, No  Murmur Respiratory: Good respiratory effort, Bilateral Air entry present. No Crackles, No wheezes Abdomen: Bowel Sound present, No tenderness Extremities: bilateral  edema Neuro: Alert and oriented x3, no new focal deficit  Data Reviewed: I have Reviewed nursing notes, Vitals, and Lab results. Since last encounter, pertinent lab results CBC and BMP   . I have ordered test including CBC and BMP  .   Disposition: Status is: Inpatient Remains inpatient appropriate because: Monitor for improvement in volume overload  Place TED hose Start: 10/01/23 1938   Family Communication: No one at bedside Level of care: Telemetry Medical   Vitals:   10/06/23 0500 10/06/23 0622 10/06/23 0834 10/06/23 1608  BP:  116/69 (!) 109/59 122/65  Pulse:  65 79 100  Resp:  20 19   Temp:  97.6 F (36.4 C)    TempSrc:  Oral    SpO2:  99% 97% 98%  Weight: 113 kg     Height:         Author: Charlean Congress, MD 10/06/2023 7:46 PM  Please look on [email or web address] to find out who is on call.

## 2023-10-06 NOTE — Plan of Care (Signed)
  Problem: Coping: Goal: Ability to adjust to condition or change in health will improve Outcome: Progressing   Problem: Fluid Volume: Goal: Ability to maintain a balanced intake and output will improve Outcome: Progressing   Problem: Health Behavior/Discharge Planning: Goal: Ability to identify and utilize available resources and services will improve Outcome: Progressing Goal: Ability to manage health-related needs will improve Outcome: Progressing   Problem: Metabolic: Goal: Ability to maintain appropriate glucose levels will improve Outcome: Progressing   Problem: Nutritional: Goal: Maintenance of adequate nutrition will improve Outcome: Progressing Goal: Progress toward achieving an optimal weight will improve Outcome: Progressing   Problem: Skin Integrity: Goal: Risk for impaired skin integrity will decrease Outcome: Progressing   Problem: Tissue Perfusion: Goal: Adequacy of tissue perfusion will improve Outcome: Progressing   Problem: Education: Goal: Knowledge of General Education information will improve Description: Including pain rating scale, medication(s)/side effects and non-pharmacologic comfort measures Outcome: Progressing   Problem: Health Behavior/Discharge Planning: Goal: Ability to manage health-related needs will improve Outcome: Progressing   Problem: Clinical Measurements: Goal: Ability to maintain clinical measurements within normal limits will improve Outcome: Progressing Goal: Will remain free from infection Outcome: Progressing Goal: Diagnostic test results will improve Outcome: Progressing Goal: Respiratory complications will improve Outcome: Progressing Goal: Cardiovascular complication will be avoided Outcome: Progressing   Problem: Activity: Goal: Risk for activity intolerance will decrease Outcome: Progressing   Problem: Nutrition: Goal: Adequate nutrition will be maintained Outcome: Progressing   Problem: Coping: Goal:  Level of anxiety will decrease Outcome: Progressing

## 2023-10-06 NOTE — Progress Notes (Signed)
 [facility] Progress Note   Assessment/ Plan:    AKI on CKD V: Cr 5.8 4 2025, now down to 4.8 with volume overload             - will need ongoing diuresis but with Cr up a bit will decrease lasix  80 TID to BID today and add albumin  25g x 2 doses today as well             - no need for HD at present  -cont daily labs, strict I/Os, low na diet   2.  FOBT +: s/p transfusion; iron  studies OK, started aranesp  5/31 weekly   3.  Acute on chronic CHF exac: last TTE 05/2023 OK,  Diastolic component, diuresis as above  4.  Nephrolithiasis: no active symptoms now  5.  HTN: home amlodipine  on hold - BP 109 this AM, albumin  support as above.    5.  Dispo: admitted  Subjective:    Feeling well, edema improving but still present.  Tells me plan to go to [facility] this week.  Wife at work, not at bedside UOP 2.5L yesterday on lasix  80 TID, net neg 4.3L for admission.   Objective:   BP (!) 109/59 (BP Location: Left Arm)   Pulse 79   Temp 97.6 F (36.4 C) (Oral)   Resp 19   Ht 5\' 10"  (1.778 m)   Wt 113 kg   SpO2 97%   BMI 35.74 kg/m   Intake/Output Summary (Last 24 hours) at 10/06/2023 0906 Last data filed at 10/06/2023 0831 Gross per 24 hour  Intake 530 ml  Output 2350 ml  Net -1820 ml   Weight change: 0 kg  Physical Exam: GEN NAD HEENT eomi perrl PULM  normal WOB,  clear anteriorly CV RRR ABD soft EXT 2+ anasarca, improving but still fairly marked inc UE NEURO AAO x 3  Imaging: No results found.   Labs: BMET Recent Labs  Lab 10/01/23 1543 10/02/23 1610 10/03/23 0336 10/04/23 0350 10/05/23 0432 10/06/23 0507  NA 138 137 136 138 137 139  K 3.9 3.9 3.5 3.6 3.1* 3.4*  CL 108 110 108 105 104 106  CO2 19* 17* 18* 21* 21* 22  GLUCOSE 116* 94 99 93 99 136*  BUN 91* 89* 90* 89* 89* 95*  CREATININE 4.55* 4.38* 4.66* 4.69* 4.78* 4.94*  CALCIUM  9.0 8.7* 7.8* 8.3* 8.0* 8.4*  PHOS  --  7.0*  --   --   --   --    CBC Recent Labs  Lab 10/01/23 1543  10/02/23 0628 10/02/23 1142 10/02/23 1145 10/03/23 1638 10/04/23 0350 10/05/23 0432 10/06/23 0507  WBC 13.0*   < > 13.6*   < > 12.9* 11.5* 11.9* 13.0*  NEUTROABS 10.8*  --  11.3*  --   --   --   --   --   HGB 6.0*   < > 7.8*   < > 8.4* 8.2* 8.1* 8.2*  HCT 20.0*   < > 25.1*   < > 26.6* 25.6* 26.3* 25.8*  MCV 86.6   < > 88.4   < > 85.0 84.8 86.5 86.6  PLT 340   < > 314   < > 272 256 245 252   < > = values in this interval not displayed.    Medications:     sodium chloride    Intravenous Once   darbepoetin (ARANESP ) injection - NON-DIALYSIS  100 mcg Subcutaneous Q Sat-1800   furosemide   80 mg  Intravenous TID   insulin  aspart  0-9 Units Subcutaneous TID WC   pantoprazole   40 mg Oral BID   sodium bicarbonate   1,300 mg Oral TID   tamsulosin   0.4 mg Oral QPC supper

## 2023-10-07 DIAGNOSIS — N185 Chronic kidney disease, stage 5: Secondary | ICD-10-CM | POA: Diagnosis not present

## 2023-10-07 DIAGNOSIS — R601 Generalized edema: Secondary | ICD-10-CM | POA: Diagnosis not present

## 2023-10-07 LAB — GLUCOSE, CAPILLARY
Glucose-Capillary: 124 mg/dL — ABNORMAL HIGH (ref 70–99)
Glucose-Capillary: 179 mg/dL — ABNORMAL HIGH (ref 70–99)
Glucose-Capillary: 112 mg/dL — ABNORMAL HIGH (ref 70–99)
Glucose-Capillary: 175 mg/dL — ABNORMAL HIGH (ref 70–99)

## 2023-10-07 LAB — BASIC METABOLIC PANEL WITH GFR
Anion gap: 12 (ref 5–15)
BUN: 96 mg/dL — ABNORMAL HIGH (ref 8–23)
CO2: 23 mmol/L (ref 22–32)
Calcium: 8.6 mg/dL — ABNORMAL LOW (ref 8.9–10.3)
Chloride: 104 mmol/L (ref 98–111)
Creatinine, Ser: 4.94 mg/dL — ABNORMAL HIGH (ref 0.61–1.24)
GFR, Estimated: 12 mL/min — ABNORMAL LOW (ref >60)
Glucose, Bld: 119 mg/dL — ABNORMAL HIGH (ref 70–99)
Potassium: 3.4 mmol/L — ABNORMAL LOW (ref 3.5–5.1)
Sodium: 139 mmol/L (ref 135–145)

## 2023-10-07 LAB — CBC
HCT: 26 % — ABNORMAL LOW (ref 39.0–52.0)
Hemoglobin: 8.1 g/dL — ABNORMAL LOW (ref 13.0–17.0)
MCH: 27.6 pg (ref 26.0–34.0)
MCHC: 31.2 g/dL (ref 30.0–36.0)
MCV: 88.7 fL (ref 80.0–100.0)
Platelets: 218 10*3/uL (ref 150–400)
RBC: 2.93 MIL/uL — ABNORMAL LOW (ref 4.22–5.81)
RDW: 17.7 % — ABNORMAL HIGH (ref 11.5–15.5)
WBC: 13.7 10*3/uL — ABNORMAL HIGH (ref 4.0–10.5)
nRBC: 0 % (ref 0.0–0.2)

## 2023-10-07 LAB — MAGNESIUM: Magnesium: 1.8 mg/dL (ref 1.7–2.4)

## 2023-10-07 MED ORDER — POTASSIUM CHLORIDE CRYS ER 20 MEQ PO TBCR
20.0000 meq | EXTENDED_RELEASE_TABLET | Freq: Once | ORAL | Status: AC
  Administered 2023-10-07: 20 meq via ORAL
  Filled 2023-10-07: qty 1

## 2023-10-07 NOTE — Progress Notes (Signed)
 [facility] Progress Note   Assessment/ Plan:    AKI on CKD V: Cr 5.8 4 2025, now down to 4.8 with volume overload             - will need ongoing diuresis but with Cr up a bit (stable in the last 24h) will cont lasix  BID for now             - no need for HD at present but it's close and we talked about that, possibly even during this admission   -cont daily labs, strict I/Os, low na diet   2.  Anemia:  s/p transfusion; iron  studies OK, started aranesp  5/31 weekly, Hb stable in 8s.    3.  Acute on chronic CHF exac: last TTE 05/2023 OK,  Diastolic component, diuresis as above  4.  Nephrolithiasis: no active symptoms now  5.  HTN: home amlodipine  on hold - normotensive this AM.   6. Hyopkalemia: gently repleted   7.  Dispo: admitted  Subjective:    Feeling well, edema improving but still present.    Looks like dispo will be to SNF UOP 1.275L yesterday on lasix  80 BiD, net neg 4.6L for admission.   Objective:   BP 136/66 (BP Location: Left Arm)   Pulse 74   Temp (!) 97.5 F (36.4 C) (Oral)   Resp 18   Ht 5\' 10"  (1.778 m)   Wt 113.3 kg   SpO2 97%   BMI 35.84 kg/m   Intake/Output Summary (Last 24 hours) at 10/07/2023 0920 Last data filed at 10/07/2023 0600 Gross per 24 hour  Intake 240 ml  Output 775 ml  Net -535 ml   Weight change: 0.3 kg  Physical Exam: GEN NAD HEENT eomi perrl PULM  normal WOB,  clear anteriorly CV RRR ABD soft EXT 3+ anasarca, improving but still fairly marked , LUE > RUE as has been previously noted NEURO AAO x 3  Imaging: No results found.   Labs: BMET Recent Labs  Lab 10/01/23 1543 10/02/23 1610 10/03/23 0336 10/04/23 0350 10/05/23 0432 10/06/23 0507 10/07/23 0445  NA 138 137 136 138 137 139 139  K 3.9 3.9 3.5 3.6 3.1* 3.4* 3.4*  CL 108 110 108 105 104 106 104  CO2 19* 17* 18* 21* 21* 22 23  GLUCOSE 116* 94 99 93 99 136* 119*  BUN 91* 89* 90* 89* 89* 95* 96*  CREATININE 4.55* 4.38* 4.66* 4.69* 4.78* 4.94* 4.94*   CALCIUM  9.0 8.7* 7.8* 8.3* 8.0* 8.4* 8.6*  PHOS  --  7.0*  --   --   --   --   --    CBC Recent Labs  Lab 10/01/23 1543 10/02/23 0628 10/02/23 1142 10/02/23 1145 10/04/23 0350 10/05/23 0432 10/06/23 0507 10/07/23 0445  WBC 13.0*   < > 13.6*   < > 11.5* 11.9* 13.0* 13.7*  NEUTROABS 10.8*  --  11.3*  --   --   --   --   --   HGB 6.0*   < > 7.8*   < > 8.2* 8.1* 8.2* 8.1*  HCT 20.0*   < > 25.1*   < > 25.6* 26.3* 25.8* 26.0*  MCV 86.6   < > 88.4   < > 84.8 86.5 86.6 88.7  PLT 340   < > 314   < > 256 245 252 218   < > = values in this interval not displayed.    Medications:  sodium chloride    Intravenous Once   darbepoetin (ARANESP ) injection - NON-DIALYSIS  100 mcg Subcutaneous Q Sat-1800   furosemide   80 mg Intravenous BID   heparin  injection (subcutaneous)  5,000 Units Subcutaneous Q8H   insulin  aspart  0-9 Units Subcutaneous TID WC   pantoprazole   40 mg Oral BID   sodium bicarbonate   1,300 mg Oral TID   tamsulosin   0.4 mg Oral QPC supper

## 2023-10-07 NOTE — TOC Progression Note (Signed)
 Transition of Care [facility]) - Progression Note    Patient Details  Name: Calvin Schultz MRN: [id number] Date of Birth: 16-Mar-1951  [facility] of [facility]) CM/SW Contact  Katrinka Parr, [place] Phone Number: 10/07/2023, 2:43 PM  Clinical Narrative:     Informed by RN that pt's wife would like to speak with CSW regarding SNF. CSW called pt's wife and answered her questions. CSW explained SNF referral process and insurance auth process. Pt's wife has reviewed the bed offer list but plans to meet with pt today to discuss their decision.    Expected Discharge Plan: Skilled Nursing Facility Barriers to Discharge: Continued Medical Work up, English as a second language teacher, SNF Pending bed offer  Expected Discharge Plan and Services       Living arrangements for the past 2 months: Single Family Home                                       Social Determinants of Health (SDOH) Interventions SDOH Screenings   Food Insecurity: No Food Insecurity (10/02/2023)  Housing: Low Risk  (10/02/2023)  Transportation Needs: No Transportation Needs (10/02/2023)  Utilities: Not At Risk (10/02/2023)  Social Connections: Moderately Isolated (10/02/2023)  Tobacco Use: Low Risk  (10/02/2023)    Readmission Risk Interventions    07/10/2023   11:21 AM 06/27/2023    1:00 PM  Readmission Risk Prevention Plan  Transportation Screening Complete Complete  PCP or Specialist Appt within 5-7 Days  Complete  PCP or Specialist Appt within 3-5 Days Complete   Home Care Screening  Complete  Medication Review (RN CM)  Complete  HRI or Home Care Consult Complete   Social Work Consult for Recovery Care Planning/Counseling Complete   Palliative Care Screening Not Applicable   Medication Review Oceanographer) Referral to Pharmacy

## 2023-10-07 NOTE — Progress Notes (Signed)
 [facility] PROGRESS NOTE   Lacorey Brusca ZOX:[id number] DOB: 05-24-1950 DOA: 10/01/2023  PCP: Audria Leather, MD  Brief History: PMH of CKD 5, chronic A-fib, T2DM, HLD, HTN, BPH presented to the hospital with complaints of worsening swelling of his legs. Was recently hospitalized for septic shock secondary to C. difficile colitis.  Lasix  was discontinued.  Presented to the hospital now with worsening swelling for last 6 weeks progressively worsening. Nephrology consulted.  Consultants: Nephrology  Procedures: None    Subjective/Interval History: Patient mentioned that he is feeling well.  Urinating a lot.  Swelling has improved.  Denies any chest pain shortness of breath.  No nausea or vomiting.    Assessment/Plan:  CKD stage V. Acute on chronic diastolic CHF Anasarca. Hypoalbuminemia with third spacing. Baseline serum creatinine appears to be around 5.6.  On admission serum creatinine was 4.5 Patient was noted to be edematous.  Nephrology was consulted.  Patient started on IV diuretics.  Given IV albumin  as well. He seems to be diuresing well.  Weight appears to have decreased. Renal function is stable so far.  Continue to monitor.   Anemia of chronic kidney disease with acute worsening. Baseline hemoglobin 8.6.  On admission hemoglobin was 6.0.  Likely dilutional in nature due to volume overload.  Patient was transfused 2 units of PRBC.  Hemoglobin stable for the most part.  No active bleeding noted.  Positive Hemoccult Noted to have positive Hemoccult on 5/29 though stool was brown.  No overt bleeding noted.  Continue to monitor for now.   Bilateral heel ulcer. Outpatient podiatry follow-up recommended, referral placed. Appreciate wound care in the hospital.    Essential hypertension Blood pressure is reasonably well-controlled.  Amlodipine  on hold.   BPH. Flomax .    Chronic muscle spasm. On Zanaflex .  Needed.   Metabolic acidosis.   In setting of  CKD. On bicarb.  Monitor.   Obesity Estimated body mass index is 35.84 kg/m as calculated from the following:   Height as of this encounter: 5\' 10"  (1.778 m).   Weight as of this encounter: 113.3 kg.   DVT Prophylaxis: Subcutaneous heparin  Code Status: Full code Family Communication: Discussed with the patient Disposition Plan: SNF    Medications: Scheduled:  sodium chloride    Intravenous Once   darbepoetin (ARANESP ) injection - NON-DIALYSIS  100 mcg Subcutaneous Q Sat-1800   furosemide   80 mg Intravenous BID   heparin  injection (subcutaneous)  5,000 Units Subcutaneous Q8H   insulin  aspart  0-9 Units Subcutaneous TID WC   pantoprazole   40 mg Oral BID   potassium chloride   20 mEq Oral Once   sodium bicarbonate   1,300 mg Oral TID   tamsulosin   0.4 mg Oral QPC supper   Continuous: PRN:acetaminophen  **OR** acetaminophen , ondansetron  **OR** ondansetron  (ZOFRAN ) IV, tiZANidine , traZODone   Antibiotics: Anti-infectives (From admission, onward)    None       Objective:  Vital Signs  Vitals:   10/06/23 1608 10/06/23 1952 10/07/23 0451 10/07/23 1030  BP: 122/65 123/77 136/66 111/66  Pulse: 100 63 74 70  Resp:   18   Temp:  98.2 F (36.8 C) (!) 97.5 F (36.4 C) 98.3 F (36.8 C)  TempSrc:  Oral Oral   SpO2: 98% 97% 97% 97%  Weight:   113.3 kg   Height:        Intake/Output Summary (Last 24 hours) at 10/07/2023 1044 Last data filed at 10/07/2023 1013 Gross per 24 hour  Intake 240 ml  Output 1375  ml  Net -1135 ml   Filed Weights   10/04/23 0720 10/06/23 0500 10/07/23 0451  Weight: 113 kg 113 kg 113.3 kg    General appearance: Awake alert.  In no distress Resp: Clear to auscultation bilaterally.  Normal effort Cardio: S1-S2 is normal regular.  No S3-S4.  No rubs murmurs or bruit GI: Abdomen is soft.  Nontender nondistended.  Bowel sounds are present normal.  No masses organomegaly Extremities: Improved edema bilateral lower extremities.  Lab Results:  Data  Reviewed: I have personally reviewed following labs and reports of the imaging studies  CBC: Recent Labs  Lab 10/01/23 1543 10/02/23 0628 10/02/23 1142 10/02/23 1145 10/03/23 1638 10/04/23 0350 10/05/23 0432 10/06/23 0507 10/07/23 0445  WBC 13.0*   < > 13.6*   < > 12.9* 11.5* 11.9* 13.0* 13.7*  NEUTROABS 10.8*  --  11.3*  --   --   --   --   --   --   HGB 6.0*   < > 7.8*   < > 8.4* 8.2* 8.1* 8.2* 8.1*  HCT 20.0*   < > 25.1*   < > 26.6* 25.6* 26.3* 25.8* 26.0*  MCV 86.6   < > 88.4   < > 85.0 84.8 86.5 86.6 88.7  PLT 340   < > 314   < > 272 256 245 252 218   < > = values in this interval not displayed.    Basic Metabolic Panel: Recent Labs  Lab 10/02/23 0628 10/03/23 0336 10/04/23 0350 10/05/23 0432 10/06/23 0507 10/07/23 0445  NA 137 136 138 137 139 139  K 3.9 3.5 3.6 3.1* 3.4* 3.4*  CL 110 108 105 104 106 104  CO2 17* 18* 21* 21* 22 23  GLUCOSE 94 99 93 99 136* 119*  BUN 89* 90* 89* 89* 95* 96*  CREATININE 4.38* 4.66* 4.69* 4.78* 4.94* 4.94*  CALCIUM  8.7* 7.8* 8.3* 8.0* 8.4* 8.6*  MG  --  2.1 1.9 1.9 1.8 1.8  PHOS 7.0*  --   --   --   --   --     GFR: Estimated Creatinine Clearance: 17.5 mL/min (A) (by C-G formula based on SCr of 4.94 mg/dL (H)).  Liver Function Tests: Recent Labs  Lab 10/01/23 1543 10/02/23 0628  AST 17  --   ALT 15  --   ALKPHOS 75  --   BILITOT 0.5  --   PROT 5.9*  --   ALBUMIN  2.5* 2.3*    Coagulation Profile: Recent Labs  Lab 10/02/23 1145  INR 1.2     CBG: Recent Labs  Lab 10/06/23 0716 10/06/23 1203 10/06/23 1626 10/06/23 1957 10/07/23 0828  GLUCAP 142* 169* 159* 177* 124*    Anemia Panel: Recent Labs    10/05/23 0432  VITAMINB12 635    Radiology Studies: No results found.     LOS: 6 days   Melitza Metheny [employer] on [email or web address]  10/07/2023, 10:44 AM

## 2023-10-07 NOTE — Plan of Care (Signed)
  Problem: Fluid Volume: Goal: Ability to maintain a balanced intake and output will improve Outcome: Progressing   Problem: Health Behavior/Discharge Planning: Goal: Ability to identify and utilize available resources and services will improve Outcome: Progressing Goal: Ability to manage health-related needs will improve Outcome: Progressing   Problem: Metabolic: Goal: Ability to maintain appropriate glucose levels will improve Outcome: Progressing   Problem: Nutritional: Goal: Maintenance of adequate nutrition will improve Outcome: Progressing Goal: Progress toward achieving an optimal weight will improve Outcome: Progressing   Problem: Skin Integrity: Goal: Risk for impaired skin integrity will decrease Outcome: Progressing   Problem: Tissue Perfusion: Goal: Adequacy of tissue perfusion will improve Outcome: Progressing   Problem: Education: Goal: Knowledge of General Education information will improve Description: Including pain rating scale, medication(s)/side effects and non-pharmacologic comfort measures Outcome: Progressing   Problem: Health Behavior/Discharge Planning: Goal: Ability to manage health-related needs will improve Outcome: Progressing   Problem: Clinical Measurements: Goal: Ability to maintain clinical measurements within normal limits will improve Outcome: Progressing Goal: Will remain free from infection Outcome: Progressing Goal: Diagnostic test results will improve Outcome: Progressing Goal: Respiratory complications will improve Outcome: Progressing Goal: Cardiovascular complication will be avoided Outcome: Progressing   Problem: Activity: Goal: Risk for activity intolerance will decrease Outcome: Progressing   Problem: Nutrition: Goal: Adequate nutrition will be maintained Outcome: Progressing   Problem: Coping: Goal: Level of anxiety will decrease Outcome: Progressing   Problem: Elimination: Goal: Will not experience  complications related to bowel motility Outcome: Progressing Goal: Will not experience complications related to urinary retention Outcome: Progressing   Problem: Pain Managment: Goal: General experience of comfort will improve and/or be controlled Outcome: Progressing   Problem: Safety: Goal: Ability to remain free from injury will improve Outcome: Progressing   Problem: Skin Integrity: Goal: Risk for impaired skin integrity will decrease Outcome: Progressing

## 2023-10-07 NOTE — Plan of Care (Signed)
  Problem: Metabolic: Goal: Ability to maintain appropriate glucose levels will improve Outcome: Completed/Met

## 2023-10-07 NOTE — Progress Notes (Signed)
 Physical Therapy Treatment Patient Details Name: Calvin Schultz MRN: [id number] DOB: 21-Jun-1950 Today's Date: 10/07/2023   History of Present Illness 73 y.o. male who presents on 10/01/23 with worsening body edema and B foot wounds. PMH:  A-fib, CKD stage V, T2DM, HLD, HTN and BPH    PT Comments  Pt is making very slow progress towards goals. Currently pt is 2 person Mod A to Max A for bed mobility, CGA to SBA for balance sitting EOB without back support and total A for transports with lift. Pt has pain with flexion of bil knees which improves with movement; pt encouraged to work on A/ROM of the knees in sitting. Due to pt current functional status, home set up and available assistance at home recommending skilled physical therapy services < 3 hours/day in order to address strength, balance and functional mobility to decrease risk for falls, injury, immobility, skin break down and re-hospitalization.      If plan is discharge home, recommend the following: Two people to help with walking and/or transfers;Two people to help with bathing/dressing/bathroom;Assist for transportation;Assistance with cooking/housework   Can travel by private vehicle     No  Equipment Recommendations  None recommended by PT       Precautions / Restrictions Precautions Precautions: Fall Restrictions Weight Bearing Restrictions Per Provider Order: No     Mobility  Bed Mobility Overal bed mobility: Needs Assistance Bed Mobility: Rolling, Sidelying to Sit, Sit to Supine Rolling: Max assist, Used rails Sidelying to sit: Mod assist, +2 for physical assistance, Used rails   Sit to supine: Max assist, +2 for physical assistance   General bed mobility comments: required assistance to roll and reach rail  to assist. Attempted to move legs off EOB, heavy use of bed pad to assist with bed mobility and scooting to EOB.    Transfers Overall transfer level: Needs assistance Equipment used: Ambulation equipment  used Transfers: Bed to chair/wheelchair/BSC             General transfer comment: Patient declined attempting standing with Stedy due to lack of knee flexion. Transfer via Lift Equipment: Maximove  Ambulation/Gait   General Gait Details: unable at baseline      Balance Overall balance assessment: Needs assistance Sitting-balance support: Feet supported, Bilateral upper extremity supported Sitting balance-Leahy Scale: Fair Sitting balance - Comments: supervision on EOB       Standing balance comment: unable      Communication Communication Communication: No apparent difficulties  Cognition Arousal: Alert Behavior During Therapy: WFL for tasks assessed/performed   PT - Cognitive impairments: No apparent impairments       Following commands: Intact      Cueing Cueing Techniques: Verbal cues     General Comments General comments (skin integrity, edema, etc.): Pt was fatigued at end of session. No signs/symptoms of cardiac/respiratory distress. Pt provided wtih information on compreshort and swell spot for swelling in the groin      Pertinent Vitals/Pain Pain Assessment Pain Assessment: Faces Faces Pain Scale: Hurts little more Pain Location: scrotum Pain Descriptors / Indicators: Discomfort, Grimacing Pain Intervention(s): Monitored during session, Limited activity within patient's tolerance           PT Goals (current goals can now be found in the care plan section) Acute Rehab PT Goals Patient Stated Goal: return home PT Goal Formulation: With patient Time For Goal Achievement: 10/19/23 Potential to Achieve Goals: Fair Progress towards PT goals: Progressing toward goals    Frequency    Min  1X/week      PT Plan  Continue with current POC     Co-evaluation PT/OT/SLP Co-Evaluation/Treatment: Yes Reason for Co-Treatment: For patient/therapist safety;To address functional/ADL transfers PT goals addressed during session: Mobility/safety with  mobility;Balance OT goals addressed during session: ADL's and self-care      AM-PAC PT "6 Clicks" Mobility   Outcome Measure  Help needed turning from your back to your side while in a flat bed without using bedrails?: A Lot Help needed moving from lying on your back to sitting on the side of a flat bed without using bedrails?: A Lot Help needed moving to and from a bed to a chair (including a wheelchair)?: Total Help needed standing up from a chair using your arms (e.g., wheelchair or bedside chair)?: Total Help needed to walk in hospital room?: Total Help needed climbing 3-5 steps with a railing? : Total 6 Click Score: 8    End of Session   Activity Tolerance: Patient limited by fatigue;Patient tolerated treatment well Patient left: in chair;with call bell/phone within reach Nurse Communication: Mobility status;Need for lift equipment PT Visit Diagnosis: Muscle weakness (generalized) (M62.81);Pain Pain - Right/Left: Right (groin) Pain - part of body: Shoulder     Time: 1056-1140 PT Time Calculation (min) (ACUTE ONLY): 44 min  Charges:    $Therapeutic Activity: 8-22 mins PT General Charges $$ ACUTE PT VISIT: 1 Visit                     Sloan Duncans, DPT, CLT  Acute Rehabilitation Services Office: [phone] (Secure chat preferred)    Calvin Schultz 10/07/2023, 12:28 PM

## 2023-10-07 NOTE — Progress Notes (Signed)
 Occupational Therapy Treatment Patient Details Name: Calvin Schultz MRN: [id number] DOB: Dec 08, 1950 Today's Date: 10/07/2023   History of present illness 73 y.o. male who presents on 10/01/23 with worsening body edema and B foot wounds. PMH:  A-fib, CKD stage V, T2DM, HLD, HTN and BPH   OT comments  Patient received in supine and agreeable to OT/PT treatment. Patient instructed on bed mobility with patient requiring mod assist +2 and use of bed rail. Once on EOB patient with scrotum pain with decreased pain with increased time and positioning. Patient declined attempting to stand in Economy due to lack of knee flexion. Patient able to maintain sitting balance on EOB with supervision and performed grooming tasks. Patient returned to supine with max assist +2 to allow for lift pad placement and transfer to recliner with maximove. Patient will benefit from continued inpatient follow up therapy, <3 hours/day.  Acute OT to continue to follow to address established goals to facilitate DC to next venue of care.        If plan is discharge home, recommend the following:  Assist for transportation;Two people to help with bathing/dressing/bathroom;Two people to help with walking and/or transfers   Equipment Recommendations  None recommended by OT    Recommendations for Other Services      Precautions / Restrictions Precautions Precautions: Fall Restrictions Weight Bearing Restrictions Per Provider Order: No       Mobility Bed Mobility Overal bed mobility: Needs Assistance Bed Mobility: Rolling, Sidelying to Sit, Sit to Supine Rolling: Max assist, Used rails Sidelying to sit: Mod assist, +2 for physical assistance, Used rails   Sit to supine: Max assist, +2 for physical assistance   General bed mobility comments: required assistance to roll and reach rail  to assist. Attempted to move legs off EOB, heavy use of bed pad to assist with bed mobility and scooting to EOB.    Transfers Overall  transfer level: Needs assistance Equipment used: Ambulation equipment used Transfers: Bed to chair/wheelchair/BSC             General transfer comment: Patient declined attempting standing with Stedy due to lack of knee flexion. Transfer via Lift Equipment: Maximove   Balance Overall balance assessment: Needs assistance Sitting-balance support: Feet supported, Bilateral upper extremity supported Sitting balance-Leahy Scale: Fair Sitting balance - Comments: supervision on EOB       Standing balance comment: unable                           ADL either performed or assessed with clinical judgement   ADL Overall ADL's : Needs assistance/impaired     Grooming: Wash/dry hands;Wash/dry face;Oral care;Set up;Sitting Grooming Details (indicate cue type and reason): on EOB                               General ADL Comments: focused on bed mobility, sitting balance, and transfers    Extremity/Trunk Assessment              Vision       Perception     Praxis     Communication Communication Communication: No apparent difficulties   Cognition Arousal: Alert Behavior During Therapy: WFL for tasks assessed/performed Cognition: No apparent impairments                               Following commands:  Intact        Cueing   Cueing Techniques: Verbal cues  Exercises      Shoulder Instructions       General Comments VSS on RA    Pertinent Vitals/ Pain       Pain Assessment Pain Assessment: Faces Faces Pain Scale: Hurts little more Pain Location: scrotum Pain Descriptors / Indicators: Discomfort, Grimacing Pain Intervention(s): Limited activity within patient's tolerance, Monitored during session, Repositioned  Home Living                                          Prior Functioning/Environment              Frequency  Min 2X/week        Progress Toward Goals  OT Goals(current goals can now  be found in the care plan section)  Progress towards OT goals: Progressing toward goals  Acute Rehab OT Goals Patient Stated Goal: to get OOB more OT Goal Formulation: With patient Time For Goal Achievement: 10/16/23 Potential to Achieve Goals: Fair ADL Goals Pt Will Perform Upper Body Bathing: with min assist;sitting Pt Will Perform Upper Body Dressing: with min assist;sitting Pt/caregiver will Perform Home Exercise Program: Increased strength;Both right and left upper extremity;With Supervision Additional ADL Goal #1: Patient will be Mod A of 1 to roll side to side for increased independence with toileting and decreased CG burden.  Plan      Co-evaluation    PT/OT/SLP Co-Evaluation/Treatment: Yes Reason for Co-Treatment: For patient/therapist safety;To address functional/ADL transfers   OT goals addressed during session: ADL's and self-care      AM-PAC OT "6 Clicks" Daily Activity     Outcome Measure   Help from another person eating meals?: None Help from another person taking care of personal grooming?: None Help from another person toileting, which includes using toliet, bedpan, or urinal?: Total Help from another person bathing (including washing, rinsing, drying)?: A Lot Help from another person to put on and taking off regular upper body clothing?: A Lot Help from another person to put on and taking off regular lower body clothing?: Total 6 Click Score: 14    End of Session Equipment Utilized During Treatment: Other (comment) (maximove)  OT Visit Diagnosis: Unsteadiness on feet (R26.81);Muscle weakness (generalized) (M62.81);Pain Pain - part of body:  (scrotum)   Activity Tolerance Patient tolerated treatment well   Patient Left in chair;with call bell/phone within reach   Nurse Communication Mobility status;Need for lift equipment        Time: 1056-1140 OT Time Calculation (min): 44 min  Charges: OT General Charges $OT Visit: 1 Visit OT  Treatments $Self Care/Home Management : 8-22 mins $Therapeutic Activity: 8-22 mins  Anitra Barn, OTA Acute Rehabilitation Services  Office [phone]   Jovita Nipper 10/07/2023, 11:49 AM

## 2023-10-08 DIAGNOSIS — R601 Generalized edema: Secondary | ICD-10-CM | POA: Diagnosis not present

## 2023-10-08 DIAGNOSIS — N185 Chronic kidney disease, stage 5: Secondary | ICD-10-CM | POA: Diagnosis not present

## 2023-10-08 LAB — BASIC METABOLIC PANEL WITH GFR
Anion gap: 12 (ref 5–15)
BUN: 93 mg/dL — ABNORMAL HIGH (ref 8–23)
CO2: 25 mmol/L (ref 22–32)
Calcium: 8.8 mg/dL — ABNORMAL LOW (ref 8.9–10.3)
Chloride: 103 mmol/L (ref 98–111)
Creatinine, Ser: 4.98 mg/dL — ABNORMAL HIGH (ref 0.61–1.24)
GFR, Estimated: 12 mL/min — ABNORMAL LOW (ref >60)
Glucose, Bld: 123 mg/dL — ABNORMAL HIGH (ref 70–99)
Potassium: 3.4 mmol/L — ABNORMAL LOW (ref 3.5–5.1)
Sodium: 140 mmol/L (ref 135–145)

## 2023-10-08 LAB — SURGICAL PCR SCREEN
MRSA, PCR: NEGATIVE
Staphylococcus aureus: NEGATIVE

## 2023-10-08 LAB — HEPATITIS B SURFACE ANTIGEN: Hepatitis B Surface Ag: NONREACTIVE

## 2023-10-08 LAB — MAGNESIUM: Magnesium: 1.7 mg/dL (ref 1.7–2.4)

## 2023-10-08 LAB — GLUCOSE, CAPILLARY
Glucose-Capillary: 137 mg/dL — ABNORMAL HIGH (ref 70–99)
Glucose-Capillary: 253 mg/dL — ABNORMAL HIGH (ref 70–99)
Glucose-Capillary: 127 mg/dL — ABNORMAL HIGH (ref 70–99)
Glucose-Capillary: 202 mg/dL — ABNORMAL HIGH (ref 70–99)

## 2023-10-08 MED ORDER — CHLORHEXIDINE GLUCONATE CLOTH 2 % EX PADS
6.0000 | MEDICATED_PAD | Freq: Every day | CUTANEOUS | Status: DC
  Administered 2023-10-08 – 2023-10-16 (×7): 6 via TOPICAL

## 2023-10-08 MED ORDER — POTASSIUM CHLORIDE CRYS ER 20 MEQ PO TBCR
20.0000 meq | EXTENDED_RELEASE_TABLET | Freq: Two times a day (BID) | ORAL | Status: AC
  Administered 2023-10-08 (×2): 20 meq via ORAL
  Filled 2023-10-08 (×2): qty 1

## 2023-10-08 MED ORDER — FUROSEMIDE 10 MG/ML IJ SOLN
80.0000 mg | Freq: Three times a day (TID) | INTRAMUSCULAR | Status: DC
  Administered 2023-10-08 – 2023-10-09 (×4): 80 mg via INTRAVENOUS
  Filled 2023-10-08 (×4): qty 8

## 2023-10-08 MED ORDER — ORAL CARE MOUTH RINSE: 15.0000 mL | OROMUCOSAL | Status: DC | PRN

## 2023-10-08 NOTE — Plan of Care (Signed)
  Problem: Fluid Volume: Goal: Ability to maintain a balanced intake and output will improve Outcome: Not Progressing   Problem: Health Behavior/Discharge Planning: Goal: Ability to identify and utilize available resources and services will improve Outcome: Not Progressing Goal: Ability to manage health-related needs will improve Outcome: Not Progressing   Problem: Nutritional: Goal: Progress toward achieving an optimal weight will improve Outcome: Not Progressing   Problem: Skin Integrity: Goal: Risk for impaired skin integrity will decrease Outcome: Not Progressing   Problem: Tissue Perfusion: Goal: Adequacy of tissue perfusion will improve Outcome: Not Progressing   Problem: Education: Goal: Knowledge of General Education information will improve Description: Including pain rating scale, medication(s)/side effects and non-pharmacologic comfort measures Outcome: Not Progressing   Problem: Health Behavior/Discharge Planning: Goal: Ability to manage health-related needs will improve Outcome: Not Progressing   Problem: Clinical Measurements: Goal: Ability to maintain clinical measurements within normal limits will improve Outcome: Not Progressing Goal: Will remain free from infection Outcome: Not Progressing Goal: Diagnostic test results will improve Outcome: Not Progressing Goal: Respiratory complications will improve Outcome: Not Progressing Goal: Cardiovascular complication will be avoided Outcome: Not Progressing   Problem: Activity: Goal: Risk for activity intolerance will decrease Outcome: Not Progressing   Problem: Nutrition: Goal: Adequate nutrition will be maintained Outcome: Not Progressing   Problem: Coping: Goal: Level of anxiety will decrease Outcome: Not Progressing   Problem: Elimination: Goal: Will not experience complications related to bowel motility Outcome: Not Progressing Goal: Will not experience complications related to urinary  retention Outcome: Not Progressing   Problem: Pain Managment: Goal: General experience of comfort will improve and/or be controlled Outcome: Not Progressing   Problem: Safety: Goal: Ability to remain free from injury will improve Outcome: Not Progressing   Problem: Skin Integrity: Goal: Risk for impaired skin integrity will decrease Outcome: Not Progressing  Patient has pitting 4 edema throughout on fluid restriction waiting for HD cath placement for HD to start ESRD eating [employer]

## 2023-10-08 NOTE — Progress Notes (Signed)
 [facility] Progress Note   Assessment/ Plan:    AKI on CKD V: Cr 5.8 4 2025, now at 5 with volume overload             - we had a long discussion today - I think it's best to just start dialysis so we can appropriately manage his volume.  If we don't start now I expect he'll end up in the same cycle returning in the next short interval volume overloaded and with every episode will become more frail.    -he is agreeable to starting HD  -consult [facility] for access - would ideally get both TDC and perm access, vein mapping ordered.    -CLIP - notified SW   2.  Anemia:  s/p transfusion; iron  studies OK, started aranesp  5/31 weekly, Hb stable in 8s.    3.  Acute on chronic CHF exac: last TTE 05/2023 OK,  Diastolic component, diuresis as above, starting HD with UF as well.   4.  Nephrolithiasis: no active symptoms now  5.  HTN: home amlodipine  on hold - normotensive this AM.   6. Hyopkalemia: repleted   7.  Dispo: admitted  Subjective:    Feeling fine, edema improving but still present.    Was in chair for 6hrs yesterday after PT left him - he was very fatigued and sore. Looks like dispo will be to SNF UOP 2.2L yesterday on lasix  80 BID, net neg 6.2L for admission.   Objective:   BP (!) 117/56 (BP Location: Left Arm)   Pulse 67   Temp 98.1 F (36.7 C)   Resp 18   Ht 5\' 10"  (1.778 m)   Wt 113.3 kg   SpO2 96%   BMI 35.84 kg/m   Intake/Output Summary (Last 24 hours) at 10/08/2023 0807 Last data filed at 10/08/2023 0600 Gross per 24 hour  Intake 536 ml  Output 2200 ml  Net -1664 ml   Weight change:   Physical Exam: GEN NAD HEENT eomi perrl PULM  normal WOB,  clear anteriorly CV RRR ABD soft EXT 3+ anasarca, improving but still fairly marked , LUE > RUE as has been previously noted NEURO AAO x 3  Imaging: No results found.   Labs: BMET Recent Labs  Lab 10/02/23 0628 10/03/23 0336 10/04/23 0350 10/05/23 0432 10/06/23 0507 10/07/23 0445 10/08/23 0518   NA 137 136 138 137 139 139 140  K 3.9 3.5 3.6 3.1* 3.4* 3.4* 3.4*  CL 110 108 105 104 106 104 103  CO2 17* 18* 21* 21* 22 23 25   GLUCOSE 94 99 93 99 136* 119* 123*  BUN 89* 90* 89* 89* 95* 96* 93*  CREATININE 4.38* 4.66* 4.69* 4.78* 4.94* 4.94* 4.98*  CALCIUM  8.7* 7.8* 8.3* 8.0* 8.4* 8.6* 8.8*  PHOS 7.0*  --   --   --   --   --   --    CBC Recent Labs  Lab 10/01/23 1543 10/02/23 0628 10/02/23 1142 10/02/23 1145 10/04/23 0350 10/05/23 0432 10/06/23 0507 10/07/23 0445  WBC 13.0*   < > 13.6*   < > 11.5* 11.9* 13.0* 13.7*  NEUTROABS 10.8*  --  11.3*  --   --   --   --   --   HGB 6.0*   < > 7.8*   < > 8.2* 8.1* 8.2* 8.1*  HCT 20.0*   < > 25.1*   < > 25.6* 26.3* 25.8* 26.0*  MCV 86.6   < > 88.4   < >  84.8 86.5 86.6 88.7  PLT 340   < > 314   < > 256 245 252 218   < > = values in this interval not displayed.    Medications:     sodium chloride    Intravenous Once   darbepoetin (ARANESP ) injection - NON-DIALYSIS  100 mcg Subcutaneous Q Sat-1800   furosemide   80 mg Intravenous BID   heparin  injection (subcutaneous)  5,000 Units Subcutaneous Q8H   insulin  aspart  0-9 Units Subcutaneous TID WC   pantoprazole   40 mg Oral BID   sodium bicarbonate   1,300 mg Oral TID   tamsulosin   0.4 mg Oral QPC supper

## 2023-10-08 NOTE — Consult Note (Addendum)
 Hospital Consult    Reason for Consult:  dialysis access Requesting Physician:  Higinio Love MD MRN #:  [id number]  History of Present Illness: Calvin Schultz is a 73 y.o. male with a PMH of atrial fibrillation, DMII, HLD, HTN, and CKD5 who was admitted to [facility] on 5/29 with an AKI. He has had worsening generalized extremity swelling for the past 6 weeks.   Nephrology has evaluated the patient and recommends that the patient starts dialysis soon for volume management. We have been consulted for dialysis access.  On exam he is resting comfortably. He is aware that he needs to start dialysis likely within the next week. He denies any pain. He feels like his swelling is getting better. He is right handed.  Past Medical History:  Diagnosis Date   Arthritis    Atrial fibrillation (HCC)    CKD (chronic kidney disease) stage 3, GFR 30-59 ml/min (HCC)    Diabetes mellitus without complication (HCC)    Dysrhythmia    Hyperlipidemia    Hypertension     Past Surgical History:  Procedure Laterality Date   CYSTOSCOPY W/ URETERAL STENT PLACEMENT Right 08/05/2023   Procedure: CYSTOSCOPY, WITH RETROGRADE PYELOGRAM AND URETERAL STENT INSERTION;  Surgeon: Mellie Sprinkle., MD;  Location: [facility] OR;  Service: Urology;  Laterality: Right;   CYSTOSCOPY/URETEROSCOPY/HOLMIUM LASER/STENT PLACEMENT Left 07/24/2023   Procedure: CYSTOSCOPY/URETEROSCOPY/HOLMIUM LASER/ STENT PLACEMENT/REMOVAL OF NEPHROSTOMY TUBE;  Surgeon: Christina Coyer, MD;  Location: [facility] ORS;  Service: Urology;  Laterality: Left;  90 MINUTE CASE   CYSTOSCOPY/URETEROSCOPY/HOLMIUM LASER/STENT PLACEMENT Bilateral 08/18/2023   Procedure: CYSTOSCOPY/URETEROSCOPY/HOLMIUM LASER/STENT PLACEMENT;  Surgeon: Christina Coyer, MD;  Location: [facility] OR;  Service: Urology;  Laterality: Bilateral;   IR NEPHROSTOMY PLACEMENT LEFT  06/03/2023   KIDNEY SURGERY      No Known Allergies  Prior to Admission medications   Medication Sig Start Date End Date Taking?  Authorizing Provider  acetaminophen  (TYLENOL ) 325 MG tablet Take 2 tablets (650 mg total) by mouth every 4 (four) hours as needed for mild pain (pain score 1-3) or fever. 08/05/23  Yes Angiulli, Everlyn Hockey, PA-C  amLODipine  (NORVASC ) 5 MG tablet Take 1 tablet by mouth daily. 08/27/23  Yes [provider]  diclofenac  Sodium (VOLTAREN ) 1 % GEL Apply 2 g topically 4 (four) times daily. Patient taking differently: Apply 2 g topically 4 (four) times daily as needed (joint pain). 08/05/23  Yes Angiulli, Daniel J, PA-C  HYDROcodone -acetaminophen  (NORCO/VICODIN) 5-325 MG tablet Take 1 tablet by mouth every 6 (six) hours as needed for severe pain (pain score 7-10). 08/05/23  Yes Angiulli, Everlyn Hockey, PA-C  insulin  detemir (LEVEMIR) 100 UNIT/ML injection Inject 20 Units into the skin at bedtime as needed (hyperglycemia). 09/03/23  Yes [provider]  iron  polysaccharides (NIFEREX) 150 MG capsule Take 1 capsule (150 mg total) by mouth daily. 07/11/23  Yes Vann, Jessica U, DO  midodrine  (PROAMATINE ) 5 MG tablet Take 1 tablet (5 mg total) by mouth 3 (three) times daily as needed (Orthosatic hypotension). 08/05/23  Yes Angiulli, Daniel J, PA-C  multivitamin (RENA-VIT) TABS tablet Take 1 tablet by mouth at bedtime. 08/05/23  Yes Angiulli, Daniel J, PA-C  Nystatin (GERHARDT'S BUTT CREAM) CREA Apply 1 Application topically 2 (two) times daily. 08/20/23  Yes Angiulli, Everlyn Hockey, PA-C  psyllium (HYDROCIL/METAMUCIL) 95 % PACK Take 1 packet by mouth 2 (two) times daily. 08/05/23  Yes Angiulli, Everlyn Hockey, PA-C  sodium bicarbonate  650 MG tablet Take 2 tablets (1,300 mg total) by mouth 3 (three) times  daily. 08/20/23  Yes Angiulli, Everlyn Hockey, PA-C  tamsulosin  (FLOMAX ) 0.4 MG CAPS capsule Take 2 capsules (0.8 mg total) by mouth daily after supper. 08/27/23  Yes Lylia Sand, MD  tiZANidine  (ZANAFLEX ) 4 MG tablet Take 1 tablet (4 mg total) by mouth 2 (two) times daily as needed for muscle spasms. 08/05/23  Yes Angiulli, Everlyn Hockey,  PA-C  traMADol  (ULTRAM ) 50 MG tablet Take 1 tablet (50 mg total) by mouth every 12 (twelve) hours as needed for moderate pain (pain score 4-6). 08/20/23  Yes Angiulli, Everlyn Hockey, PA-C    Social History   Socioeconomic History   Marital status: Married    Spouse name: Not on file   Number of children: 1   Years of education: Not on file   Highest education level: Not on file  Occupational History   Not on file  Tobacco Use   Smoking status: Never   Smokeless tobacco: Never  Vaping Use   Vaping status: Never Used  Substance and Sexual Activity   Alcohol use: Never   Drug use: Never   Sexual activity: Not Currently  Other Topics Concern   Not on file  Social History Narrative   Not on file   Social Drivers of Health   Financial Resource Strain: Not on file  Food Insecurity: No Food Insecurity (10/02/2023)   Hunger Vital Sign    Worried About Running Out of Food in the Last Year: Never true    Ran Out of Food in the Last Year: Never true  Transportation Needs: No Transportation Needs (10/02/2023)   PRAPARE - Administrator, Civil Service (Medical): No    Lack of Transportation (Non-Medical): No  Physical Activity: Not on file  Stress: Not on file  Social Connections: Moderately Isolated (10/02/2023)   Social Connection and Isolation Panel [NHANES]    Frequency of Communication with Friends and Family: More than three times a week    Frequency of Social Gatherings with Friends and Family: More than three times a week    Attends Religious Services: Never    Database administrator or Organizations: No    Attends Banker Meetings: Never    Marital Status: Married  Catering manager Violence: Not At Risk (10/02/2023)   Humiliation, Afraid, Rape, and Kick questionnaire    Fear of Current or Ex-Partner: No    Emotionally Abused: No    Physically Abused: No    Sexually Abused: No     Family History  Problem Relation Age of Onset   Dementia Mother      ROS: Otherwise negative unless mentioned in HPI  Physical Examination  Vitals:   10/08/23 0453 10/08/23 0811  BP: (!) 117/56 124/74  Pulse: 67 72  Resp: 18 18  Temp: 98.1 F (36.7 C) 98 F (36.7 C)  SpO2: 96% 99%   Body mass index is 35.84 kg/m.  General:  WDWN in NAD Gait: Not observed HENT: WNL, normocephalic Pulmonary: normal non-labored breathing Cardiac: regular Abdomen:  soft, NT/ND, no masses Skin: without rashes Vascular Exam/Pulses: BLE warm and well perfused Extremities: generalized edema of bilateral upper and lower extremities. Palpable bilateral radial and brachial pulses Musculoskeletal: no muscle wasting or atrophy  Neurologic: A&O X 3;  No focal weakness or paresthesias are detected; speech is fluent/normal Psychiatric:  The pt has Normal affect. Lymph:  Unremarkable  CBC    Component Value Date/Time   WBC 13.7 (H) 10/07/2023 0445   RBC 2.93 (L) 10/07/2023  0445   HGB 8.1 (L) 10/07/2023 0445   HGB 13.2 06/25/2021 1539   HCT 26.0 (L) 10/07/2023 0445   HCT 41.2 06/25/2021 1539   PLT 218 10/07/2023 0445   PLT 367 06/25/2021 1539   MCV 88.7 10/07/2023 0445   MCV 84 06/25/2021 1539   MCH 27.6 10/07/2023 0445   MCHC 31.2 10/07/2023 0445   RDW 17.7 (H) 10/07/2023 0445   RDW 12.6 06/25/2021 1539   LYMPHSABS 0.8 10/02/2023 1142   LYMPHSABS 2.4 06/25/2021 1539   MONOABS 1.2 (H) 10/02/2023 1142   EOSABS 0.2 10/02/2023 1142   EOSABS 0.2 06/25/2021 1539   BASOSABS 0.1 10/02/2023 1142   BASOSABS 0.1 06/25/2021 1539    BMET    Component Value Date/Time   NA 140 10/08/2023 0518   NA 139 06/25/2021 1539   K 3.4 (L) 10/08/2023 0518   CL 103 10/08/2023 0518   CO2 25 10/08/2023 0518   GLUCOSE 123 (H) 10/08/2023 0518   BUN 93 (H) 10/08/2023 0518   BUN 22 06/25/2021 1539   CREATININE 4.98 (H) 10/08/2023 0518   CALCIUM  8.8 (L) 10/08/2023 0518   GFRNONAA 12 (L) 10/08/2023 0518    COAGS: Lab Results  Component Value Date   INR 1.2 10/02/2023    INR 1.1 06/03/2023     Non-Invasive Vascular Imaging:   Vein Mapping Ordered   ASSESSMENT/PLAN: This is a 73 y.o. male in need of dialysis access  -The patient was admitted to the hospital on 5/29 with volume overload and an AKI on CKD5. Nephrology has seen the patient and recommends that he start dialysis within the next week for volume management  -On exam he is resting comfortably. He does have generalized edema in bilateral upper and lower extremities, however this has improved since his admission -I have explained to the patient that we can place a tunneled dialysis catheter and create permanent dialysis access in his nondominant arm. He is right handed. I have explained that access in the arm would either be a fistula or graft depending if he has any suitable veins.  -He has palpable radial and brachial pulses bilaterally -Vein mapping has been ordered -Per nephrology, the patient does not need dialysis urgently. Hopefully we can plan for [facility] placement and left arm AVF vs AVG creation early next week. If he ends up needing dialysis within the next few days, we would recommend involving IR for temp catheter or TDC placement   Deneise Finlay PA-C [facility] [phone]   VASCULAR STAFF ADDENDUM: I have independently interviewed and examined the patient. I agree with the above.  Plan TDC + AVF vs. AVG tomorrow afternoon in OR as schedule availability allows. Keep NPO at midnight. Orders for consent written.  Heber Little. Edgardo Goodwill, MD [facility] Phone Number: [phone] 10/08/2023 4:02 PM

## 2023-10-08 NOTE — TOC Progression Note (Signed)
 Transition of Care [facility]) - Progression Note    Patient Details  Name: Calvin Schultz MRN: [id number] Date of Birth: 12/22/1950  [facility] of [facility]) CM/SW Contact  Katrinka Parr, [place] Phone Number: 10/08/2023, 3:22 PM  Clinical Narrative:     CSW called pt's spouse; left voicemail requesting return call.   Expected Discharge Plan: Skilled Nursing Facility Barriers to Discharge: Continued Medical Work up, English as a second language teacher, SNF Pending bed offer  Expected Discharge Plan and Services       Living arrangements for the past 2 months: Single Family Home                                       Social Determinants of Health (SDOH) Interventions SDOH Screenings   Food Insecurity: No Food Insecurity (10/02/2023)  Housing: Low Risk  (10/02/2023)  Transportation Needs: No Transportation Needs (10/02/2023)  Utilities: Not At Risk (10/02/2023)  Social Connections: Moderately Isolated (10/02/2023)  Tobacco Use: Low Risk  (10/02/2023)    Readmission Risk Interventions    07/10/2023   11:21 AM 06/27/2023    1:00 PM  Readmission Risk Prevention Plan  Transportation Screening Complete Complete  PCP or Specialist Appt within 5-7 Days  Complete  PCP or Specialist Appt within 3-5 Days Complete   Home Care Screening  Complete  Medication Review (RN CM)  Complete  HRI or Home Care Consult Complete   Social Work Consult for Recovery Care Planning/Counseling Complete   Palliative Care Screening Not Applicable   Medication Review Oceanographer) Referral to Pharmacy

## 2023-10-08 NOTE — Progress Notes (Signed)
 [facility] PROGRESS NOTE   Calvin Schultz ZOX:[id number] DOB: Sep 02, 1950 DOA: 10/01/2023  PCP: Audria Leather, MD  Brief History: PMH of CKD 5, chronic A-fib, T2DM, HLD, HTN, BPH presented to the hospital with complaints of worsening swelling of his legs. Was recently hospitalized for septic shock secondary to C. difficile colitis.  Lasix  was discontinued.  Presented to the hospital now with worsening swelling for last 6 weeks progressively worsening. Nephrology consulted.  Consultants: Nephrology  Procedures: None    Subjective/Interval History: Patient denies any complaints this morning.  No shortness of breath nausea or vomiting.  He mentions that nephrologist saw him this morning and is recommending starting him on hemodialysis.  He agrees with this plan.      Assessment/Plan:  CKD stage V. Acute on chronic diastolic CHF Anasarca. Hypoalbuminemia with third spacing. Metabolic acidosis Baseline serum creatinine appears to be around 5.6.  On admission serum creatinine was 4.5 Patient was noted to be edematous.  Nephrology was consulted.  Patient started on IV diuretics.  Given IV albumin  as well. He has been diuresing.  However remains significantly volume overloaded.  Nephrology plans to initiate hemodialysis. Electrolyte management per nephrology. Remains on sodium bicarbonate  bicarbonate level has improved significantly.   Anemia of chronic kidney disease with acute worsening. Baseline hemoglobin 8.6.  On admission hemoglobin was 6.0.  Likely dilutional in nature due to volume overload.  Patient was transfused 2 units of PRBC.  Hemoglobin stable for the most part.  No active bleeding noted.  Positive Hemoccult Noted to have positive Hemoccult on 5/29 though stool was brown.  No overt bleeding noted.  Continue to monitor for now.   Bilateral heel ulcer Outpatient podiatry follow-up recommended, referral placed. Appreciate wound care in the hospital.     Essential hypertension Blood pressure is reasonably well-controlled.  Amlodipine  on hold.   BPH. Flomax .    Chronic muscle spasm. On Zanaflex .  Needed.  Obesity Estimated body mass index is 35.84 kg/m as calculated from the following:   Height as of this encounter: 5\' 10"  (1.778 m).   Weight as of this encounter: 113.3 kg.   DVT Prophylaxis: Subcutaneous heparin  Code Status: Full code Family Communication: Discussed with the patient Disposition Plan: SNF    Medications: Scheduled:  sodium chloride    Intravenous Once   Chlorhexidine  Gluconate Cloth  6 each Topical Q0600   darbepoetin (ARANESP ) injection - NON-DIALYSIS  100 mcg Subcutaneous Q Sat-1800   furosemide   80 mg Intravenous TID   heparin  injection (subcutaneous)  5,000 Units Subcutaneous Q8H   insulin  aspart  0-9 Units Subcutaneous TID WC   pantoprazole   40 mg Oral BID   potassium chloride   20 mEq Oral BID   sodium bicarbonate   1,300 mg Oral TID   tamsulosin   0.4 mg Oral QPC supper   Continuous: PRN:acetaminophen  **OR** acetaminophen , ondansetron  **OR** ondansetron  (ZOFRAN ) IV, mouth rinse, tiZANidine , traZODone    Objective:  Vital Signs  Vitals:   10/07/23 1721 10/07/23 2028 10/08/23 0453 10/08/23 0811  BP: 135/78 (!) 118/101 (!) 117/56 124/74  Pulse: 75 64 67 72  Resp:  18 18 18   Temp: 97.9 F (36.6 C) 98.7 F (37.1 C) 98.1 F (36.7 C) 98 F (36.7 C)  TempSrc:      SpO2: 98% 96% 96% 99%  Weight:      Height:        Intake/Output Summary (Last 24 hours) at 10/08/2023 1007 Last data filed at 10/08/2023 0830 Gross per 24 hour  Intake  776 ml  Output 1600 ml  Net -824 ml   Filed Weights   10/04/23 0720 10/06/23 0500 10/07/23 0451  Weight: 113 kg 113 kg 113.3 kg    General appearance: Awake alert.  In no distress Resp: Clear to auscultation bilaterally.  Normal effort Cardio: S1-S2 is normal regular.  No S3-S4.  No rubs murmurs or bruit GI: Abdomen is soft.  Nontender nondistended.  Bowel  sounds are present normal.  No masses organomegaly Extremities: 2+ edema bilateral lower extremities  Lab Results:  Data Reviewed: I have personally reviewed following labs and reports of the imaging studies  CBC: Recent Labs  Lab 10/01/23 1543 10/02/23 0628 10/02/23 1142 10/02/23 1145 10/03/23 1638 10/04/23 0350 10/05/23 0432 10/06/23 0507 10/07/23 0445  WBC 13.0*   < > 13.6*   < > 12.9* 11.5* 11.9* 13.0* 13.7*  NEUTROABS 10.8*  --  11.3*  --   --   --   --   --   --   HGB 6.0*   < > 7.8*   < > 8.4* 8.2* 8.1* 8.2* 8.1*  HCT 20.0*   < > 25.1*   < > 26.6* 25.6* 26.3* 25.8* 26.0*  MCV 86.6   < > 88.4   < > 85.0 84.8 86.5 86.6 88.7  PLT 340   < > 314   < > 272 256 245 252 218   < > = values in this interval not displayed.    Basic Metabolic Panel: Recent Labs  Lab 10/02/23 0628 10/03/23 0336 10/04/23 0350 10/05/23 0432 10/06/23 0507 10/07/23 0445 10/08/23 0518  NA 137   < > 138 137 139 139 140  K 3.9   < > 3.6 3.1* 3.4* 3.4* 3.4*  CL 110   < > 105 104 106 104 103  CO2 17*   < > 21* 21* 22 23 25   GLUCOSE 94   < > 93 99 136* 119* 123*  BUN 89*   < > 89* 89* 95* 96* 93*  CREATININE 4.38*   < > 4.69* 4.78* 4.94* 4.94* 4.98*  CALCIUM  8.7*   < > 8.3* 8.0* 8.4* 8.6* 8.8*  MG  --    < > 1.9 1.9 1.8 1.8 1.7  PHOS 7.0*  --   --   --   --   --   --    < > = values in this interval not displayed.    GFR: Estimated Creatinine Clearance: 17.4 mL/min (A) (by C-G formula based on SCr of 4.98 mg/dL (H)).  Liver Function Tests: Recent Labs  Lab 10/01/23 1543 10/02/23 0628  AST 17  --   ALT 15  --   ALKPHOS 75  --   BILITOT 0.5  --   PROT 5.9*  --   ALBUMIN  2.5* 2.3*    Coagulation Profile: Recent Labs  Lab 10/02/23 1145  INR 1.2     CBG: Recent Labs  Lab 10/07/23 0828 10/07/23 1154 10/07/23 1716 10/07/23 2026 10/08/23 0750  GLUCAP 124* 179* 112* 175* 137*    Radiology Studies: No results found.     LOS: 7 days   Seaton Hofmann [employer] on [email or web address]  10/08/2023, 10:07 AM

## 2023-10-08 NOTE — Progress Notes (Signed)
Out Patient Arrangements:  Have been requested to arrange out pt HD for pt. Have submitted medical records to [facility] admissions. Please advise of d/c date.   Linus Orn HPSS [phone]

## 2023-10-08 NOTE — Plan of Care (Signed)
  Problem: Education: Goal: Knowledge of disease and its progression will improve Outcome: Completed/Met

## 2023-10-08 NOTE — Progress Notes (Signed)
 This RN hand-off report to [place], Charity fundraiser on . Pt in bed, on RA, in no acute distress at this time. Call bell in reach, bed in lowest position, bed alarm on.

## 2023-10-09 ENCOUNTER — Encounter (HOSPITAL_COMMUNITY): Payer: Self-pay | Admitting: Student

## 2023-10-09 ENCOUNTER — Encounter (HOSPITAL_COMMUNITY)
Admission: EM | Disposition: A | Discharge status: Skilled Nursing Facility | Payer: Self-pay | Source: Home / Self Care | Attending: Internal Medicine

## 2023-10-09 ENCOUNTER — Inpatient Hospital Stay (HOSPITAL_COMMUNITY)

## 2023-10-09 ENCOUNTER — Other Ambulatory Visit: Payer: Self-pay

## 2023-10-09 DIAGNOSIS — Z992 Dependence on renal dialysis: Secondary | ICD-10-CM

## 2023-10-09 DIAGNOSIS — N186 End stage renal disease: Secondary | ICD-10-CM

## 2023-10-09 DIAGNOSIS — I120 Hypertensive chronic kidney disease with stage 5 chronic kidney disease or end stage renal disease: Secondary | ICD-10-CM | POA: Diagnosis not present

## 2023-10-09 DIAGNOSIS — E1122 Type 2 diabetes mellitus with diabetic chronic kidney disease: Secondary | ICD-10-CM

## 2023-10-09 HISTORY — PX: INSERTION OF DIALYSIS CATHETER: SHX1324

## 2023-10-09 HISTORY — PX: AV FISTULA PLACEMENT: SHX1204

## 2023-10-09 LAB — GLUCOSE, CAPILLARY
Glucose-Capillary: 112 mg/dL — ABNORMAL HIGH (ref 70–99)
Glucose-Capillary: 108 mg/dL — ABNORMAL HIGH (ref 70–99)
Glucose-Capillary: 110 mg/dL — ABNORMAL HIGH (ref 70–99)
Glucose-Capillary: 131 mg/dL — ABNORMAL HIGH (ref 70–99)

## 2023-10-09 LAB — POCT I-STAT, CHEM 8
BUN: 105 mg/dL — ABNORMAL HIGH (ref 8–23)
Calcium, Ion: 1.16 mmol/L (ref 1.15–1.40)
Chloride: 98 mmol/L (ref 98–111)
Creatinine, Ser: 4.8 mg/dL — ABNORMAL HIGH (ref 0.61–1.24)
Glucose, Bld: 105 mg/dL — ABNORMAL HIGH (ref 70–99)
HCT: 30 % — ABNORMAL LOW (ref 39.0–52.0)
Hemoglobin: 10.2 g/dL — ABNORMAL LOW (ref 13.0–17.0)
Potassium: 4.3 mmol/L (ref 3.5–5.1)
Sodium: 140 mmol/L (ref 135–145)
TCO2: 28 mmol/L (ref 22–32)

## 2023-10-09 LAB — HEPATITIS B SURFACE ANTIBODY, QUANTITATIVE: Hep B S AB Quant (Post): <3.5 m[IU]/mL — ABNORMAL LOW

## 2023-10-09 SURGERY — ARTERIOVENOUS (AV) FISTULA CREATION
Anesthesia: General | Site: Neck | Laterality: Right

## 2023-10-09 MED ORDER — FENTANYL CITRATE (PF) 100 MCG/2ML IJ SOLN
INTRAMUSCULAR | Status: AC
  Filled 2023-10-09: qty 2

## 2023-10-09 MED ORDER — PROPOFOL 10 MG/ML IV BOLUS
INTRAVENOUS | Status: DC | PRN
  Administered 2023-10-09: 120 mg via INTRAVENOUS
  Administered 2023-10-09: 30 mg via INTRAVENOUS

## 2023-10-09 MED ORDER — FENTANYL CITRATE (PF) 100 MCG/2ML IJ SOLN
25.0000 ug | INTRAMUSCULAR | Status: DC | PRN
  Administered 2023-10-09: 50 ug via INTRAVENOUS

## 2023-10-09 MED ORDER — HEPARIN SODIUM (PORCINE) 1000 UNIT/ML IJ SOLN
INTRAMUSCULAR | Status: DC | PRN
  Administered 2023-10-09: 3000 [IU] via INTRAVENOUS

## 2023-10-09 MED ORDER — CEFAZOLIN SODIUM-DEXTROSE 2-3 GM-%(50ML) IV SOLR
INTRAVENOUS | Status: DC | PRN
Start: 2023-10-09 — End: 2023-10-09
  Administered 2023-10-09: 2 g via INTRAVENOUS

## 2023-10-09 MED ORDER — HEPARIN SODIUM (PORCINE) 1000 UNIT/ML IJ SOLN
INTRAMUSCULAR | Status: DC | PRN
  Administered 2023-10-09: 3200 [IU]

## 2023-10-09 MED ORDER — LIDOCAINE 2% (20 MG/ML) 5 ML SYRINGE
INTRAMUSCULAR | Status: DC | PRN
  Administered 2023-10-09: 100 mg via INTRAVENOUS

## 2023-10-09 MED ORDER — FENTANYL CITRATE (PF) 250 MCG/5ML IJ SOLN
INTRAMUSCULAR | Status: AC
  Filled 2023-10-09: qty 5

## 2023-10-09 MED ORDER — HEPARIN SODIUM (PORCINE) 1000 UNIT/ML IJ SOLN
INTRAMUSCULAR | Status: AC
Start: 2023-10-09 — End: ?
  Filled 2023-10-09: qty 10

## 2023-10-09 MED ORDER — OXYCODONE HCL 5 MG PO TABS: 5.0000 mg | ORAL_TABLET | Freq: Once | ORAL | Status: DC | PRN

## 2023-10-09 MED ORDER — ONDANSETRON HCL 4 MG/2ML IJ SOLN
INTRAMUSCULAR | Status: DC | PRN
  Administered 2023-10-09: 4 mg via INTRAVENOUS

## 2023-10-09 MED ORDER — FENTANYL CITRATE (PF) 250 MCG/5ML IJ SOLN
INTRAMUSCULAR | Status: DC | PRN
Start: 2023-10-09 — End: 2023-10-09
  Administered 2023-10-09: 25 ug via INTRAVENOUS
  Administered 2023-10-09 (×2): 50 ug via INTRAVENOUS
  Administered 2023-10-09: 25 ug via INTRAVENOUS

## 2023-10-09 MED ORDER — DEXAMETHASONE SODIUM PHOSPHATE 10 MG/ML IJ SOLN
INTRAMUSCULAR | Status: DC | PRN
  Administered 2023-10-09: 5 mg via INTRAVENOUS

## 2023-10-09 MED ORDER — OXYCODONE HCL 5 MG/5ML PO SOLN: 5.0000 mg | Freq: Once | ORAL | Status: DC | PRN

## 2023-10-09 MED ORDER — SODIUM CHLORIDE 0.9 % IV SOLN: INTRAVENOUS | Status: DC

## 2023-10-09 MED ORDER — PHENYLEPHRINE HCL-NACL 20-0.9 MG/250ML-% IV SOLN
INTRAVENOUS | Status: DC | PRN
  Administered 2023-10-09: 25 ug/min via INTRAVENOUS

## 2023-10-09 MED ORDER — 0.9 % SODIUM CHLORIDE (POUR BTL) OPTIME
TOPICAL | Status: DC | PRN
  Administered 2023-10-09: 1000 mL

## 2023-10-09 MED ORDER — DROPERIDOL 2.5 MG/ML IJ SOLN: 0.6250 mg | Freq: Once | INTRAMUSCULAR | Status: DC | PRN

## 2023-10-09 MED ORDER — HEMOSTATIC AGENTS (NO CHARGE) OPTIME
TOPICAL | Status: DC | PRN
  Administered 2023-10-09: 1 via TOPICAL

## 2023-10-09 MED ORDER — LIDOCAINE-EPINEPHRINE (PF) 1 %-1:200000 IJ SOLN
INTRAMUSCULAR | Status: AC
Start: 2023-10-09 — End: ?
  Filled 2023-10-09: qty 30

## 2023-10-09 MED ORDER — HEPARIN 6000 UNIT IRRIGATION SOLUTION
Status: AC
  Filled 2023-10-09: qty 500

## 2023-10-09 MED ORDER — ACETAMINOPHEN 10 MG/ML IV SOLN: 1000.0000 mg | Freq: Once | INTRAVENOUS | Status: DC | PRN

## 2023-10-09 MED ORDER — CHLORHEXIDINE GLUCONATE 0.12 % MT SOLN: 15.0000 mL | Freq: Once | OROMUCOSAL | Status: AC

## 2023-10-09 MED ORDER — HEPARIN 6000 UNIT IRRIGATION SOLUTION
Status: DC | PRN
  Administered 2023-10-09: 1

## 2023-10-09 MED ORDER — CHLORHEXIDINE GLUCONATE 0.12 % MT SOLN
OROMUCOSAL | Status: AC
  Administered 2023-10-09: 15 mL via OROMUCOSAL
  Filled 2023-10-09: qty 15

## 2023-10-09 MED ORDER — ORAL CARE MOUTH RINSE: 15.0000 mL | Freq: Once | OROMUCOSAL | Status: AC

## 2023-10-09 MED ORDER — MORPHINE SULFATE (PF) 2 MG/ML IV SOLN
2.0000 mg | INTRAVENOUS | Status: DC | PRN
  Administered 2023-10-09 – 2023-10-12 (×9): 2 mg via INTRAVENOUS
  Filled 2023-10-09 (×8): qty 1

## 2023-10-09 MED ORDER — PROPOFOL 10 MG/ML IV BOLUS
INTRAVENOUS | Status: AC
  Filled 2023-10-09: qty 20

## 2023-10-09 SURGICAL SUPPLY — 54 items
ARMBAND PINK RESTRICT EXTREMIT (MISCELLANEOUS) ×2 IMPLANT
BAG DECANTER FOR FLEXI CONT (MISCELLANEOUS) ×2 IMPLANT
BENZOIN TINCTURE PRP APPL 2/3 (GAUZE/BANDAGES/DRESSINGS) ×2 IMPLANT
BIOPATCH RED 1 DISK 7.0 (GAUZE/BANDAGES/DRESSINGS) ×2 IMPLANT
CANISTER SUCTION 3000ML PPV (SUCTIONS) ×2 IMPLANT
CANNULA VESSEL 3MM 2 BLNT TIP (CANNULA) ×2 IMPLANT
CATH PALINDROME-P 19CM W/VT (CATHETERS) IMPLANT
CATH PALINDROME-P 28CM W/VT (CATHETERS) IMPLANT
CHLORAPREP W/TINT 26 (MISCELLANEOUS) ×2 IMPLANT
CLIP LIGATING EXTRA MED SLVR (CLIP) ×2 IMPLANT
CLIP LIGATING EXTRA SM BLUE (MISCELLANEOUS) ×2 IMPLANT
COVER PROBE W GEL 5X96 (DRAPES) ×2 IMPLANT
COVER SURGICAL LIGHT HANDLE (MISCELLANEOUS) ×2 IMPLANT
DERMABOND ADVANCED .7 DNX12 (GAUZE/BANDAGES/DRESSINGS) IMPLANT
DRAPE C-ARM 42X72 X-RAY (DRAPES) ×2 IMPLANT
DRAPE CHEST BREAST 15X10 FENES (DRAPES) ×2 IMPLANT
DRSG TEGADERM 4X4.75 (GAUZE/BANDAGES/DRESSINGS) IMPLANT
ELECTRODE REM PT RTRN 9FT ADLT (ELECTROSURGICAL) ×2 IMPLANT
GAUZE 4X4 16PLY ~~LOC~~+RFID DBL (SPONGE) ×2 IMPLANT
GAUZE SPONGE 2X2 8PLY STRL LF (GAUZE/BANDAGES/DRESSINGS) IMPLANT
GAUZE SPONGE 4X4 12PLY STRL (GAUZE/BANDAGES/DRESSINGS) ×2 IMPLANT
GLOVE BIO SURGEON STRL SZ8 (GLOVE) ×2 IMPLANT
GOWN STRL REUS W/ TWL LRG LVL3 (GOWN DISPOSABLE) ×4 IMPLANT
GOWN STRL REUS W/ TWL XL LVL3 (GOWN DISPOSABLE) ×2 IMPLANT
HEMOSTAT SNOW SURGICEL 2X4 (HEMOSTASIS) IMPLANT
INSERT FOGARTY SM (MISCELLANEOUS) IMPLANT
KIT BASIN OR (CUSTOM PROCEDURE TRAY) ×2 IMPLANT
KIT PALINDROME-P 55CM (CATHETERS) IMPLANT
KIT TURNOVER KIT B (KITS) ×2 IMPLANT
NDL 18GX1X1/2 (RX/OR ONLY) (NEEDLE) ×2 IMPLANT
NDL HYPO 25GX1X1/2 BEV (NEEDLE) ×2 IMPLANT
NEEDLE 18GX1X1/2 (RX/OR ONLY) (NEEDLE) ×2 IMPLANT
NEEDLE HYPO 25GX1X1/2 BEV (NEEDLE) ×2 IMPLANT
NS IRRIG 1000ML POUR BTL (IV SOLUTION) ×2 IMPLANT
PACK CV ACCESS (CUSTOM PROCEDURE TRAY) ×2 IMPLANT
PACK SRG BSC III STRL LF ECLPS (CUSTOM PROCEDURE TRAY) ×2 IMPLANT
PAD ARMBOARD POSITIONER FOAM (MISCELLANEOUS) ×4 IMPLANT
SET MICROPUNCTURE 5F STIFF (MISCELLANEOUS) ×2 IMPLANT
SLING ARM FOAM STRAP LRG (SOFTGOODS) IMPLANT
SOAP 2 % CHG 4 OZ (WOUND CARE) ×2 IMPLANT
STRIP CLOSURE SKIN 1/2X4 (GAUZE/BANDAGES/DRESSINGS) ×2 IMPLANT
SUT ETHILON 3 0 PS 1 (SUTURE) ×2 IMPLANT
SUT MNCRL AB 4-0 PS2 18 (SUTURE) ×2 IMPLANT
SUT PROLENE 6 0 BV (SUTURE) ×2 IMPLANT
SUT VIC AB 3-0 SH 27X BRD (SUTURE) ×2 IMPLANT
SYR 10ML LL (SYRINGE) ×2 IMPLANT
SYR 20ML LL LF (SYRINGE) ×4 IMPLANT
SYR 3ML LL SCALE MARK (SYRINGE) IMPLANT
SYR 5ML LL (SYRINGE) ×2 IMPLANT
SYR CONTROL 10ML LL (SYRINGE) ×2 IMPLANT
TOWEL GREEN STERILE (TOWEL DISPOSABLE) ×2 IMPLANT
TOWEL GREEN STERILE FF (TOWEL DISPOSABLE) ×4 IMPLANT
UNDERPAD 30X36 HEAVY ABSORB (UNDERPADS AND DIAPERS) ×2 IMPLANT
WATER STERILE IRR 1000ML POUR (IV SOLUTION) ×2 IMPLANT

## 2023-10-09 NOTE — Transfer of Care (Signed)
 Immediate Anesthesia Transfer of Care Note  Patient: Jaquavian Firkus  Procedure(s) Performed: ARTERIOVENOUS (AV) FISTULA CREATION, FIRST STAGE BASILIC (Left: Arm Lower) INSERTION OF DIALYSIS CATHETER, USING PALINDROME CATHETER KIT 19CM (Right: Neck)  Patient Location: PACU  Anesthesia Type:General  Level of Consciousness: awake and alert   Airway & Oxygen Therapy: Patient Spontanous Breathing and Patient connected to nasal cannula oxygen  Post-op Assessment: Report given to RN and Post -op Vital signs reviewed and stable  Post vital signs: Reviewed and stable  Last Vitals:  Vitals Value Taken Time  BP 121/65   Temp    Pulse 62 10/09/23 1851  Resp 14 10/09/23 1851  SpO2 98 % 10/09/23 1851  Vitals shown include unfiled device data.  Last Pain:  Vitals:   10/09/23 1531  TempSrc: Oral  PainSc:          Complications: No notable events documented.

## 2023-10-09 NOTE — Op Note (Signed)
 OPERATIVE NOTE  PROCEDURE:   Intraoperative left arm vein mapping left arm 1st stage brachiobasilic AVF creation Right internal jugular vein tunneled dialysis catheter placement under ultrasound and fluoroscopic guidance  PRE-OPERATIVE DIAGNOSIS: ESRD  POST-OPERATIVE DIAGNOSIS: same as above   SURGEON: Philipp Brawn MD  ASSISTANT(S): Maryanna Smart, PA  Given the complexity of the case,  the assistant was necessary in order to expedient the procedure and safely perform the technical aspects of the operation.  The assistant provided traction and countertraction to assist with exposure of the artery and vein.  They also assisted with suture ligation of multiple venous branches.  They played a critical role in the anastomosis. These skills, especially following the Prolene suture for the anastomosis, could not have been adequately performed by a scrub tech assistant.   ANESTHESIA: general  ESTIMATED BLOOD LOSS: 15 cc  FINDING(S): Sufficiently sized left arm basilic vein from the upper arm to the [facility] fossa.  The cephalic vein was borderline but there was short segment thrombosis in the cephalic vein likely from an IV in the [facility] fossa. Sufficiently sized left brachial artery at the [facility] fossa Palpable and doppler thrill in AVF with multiphasic radial artery on completion Compressible and patent right internal jugular vein although on the smaller side.  Excellent placement of right IJ TDC at atriocaval junction   SPECIMEN(S):  none  INDICATIONS:   Calvin Schultz is a 73 y.o. male with ESRD. The patient is not currently on dialysis is planned to start imminently. They were seen in the office for evaluation of hemodialysis access. The risks an benefits including of access creation were reviewed including: need for additional procedures, need for additional creations, steal, ischemia monomelic neuropathy, failure of access, and bleeding. The patient expressed understand and is willing to  proceed.    DESCRIPTION: The patient was brought to the operating room positioned supine on operating room table.  The neck and left arm were prepped and draped in the usual sterile fashion.  Anesthesia was induced, preoperative antibiotics were administered and a timeout was performed. Using ultrasound guidance the right internal jugular vein was accessed with micropuncture technique.  Through the micropuncture sheath, the guidewire was advanced into the superior vena cava.  A small incision was made around the skin access point.  The access point was serially dilated under direct fluoroscopic guidance.  A peel-away sheath was introduced into the superior vena cava under fluoroscopic guidance.  A counterincision was made in the chest under the clavicle.  A 19 cm tunnel dialysis catheter was then tunneled under the skin, over the clavicle into the incision in the neck.  The tunneling device was removed and the catheter fed through the peel-away sheath into the superior vena cava.  The peel-away sheath was removed and the catheter gently pulled back.  Adequate position was confirmed with x-ray.  The catheter was tested and found to aspirate and flush with ease.    The catheter was sutured to the skin and the neck incision was closed with 4-0 Monocryl.  The catheter was then capped and heparin  locked.  The basilic vein in the left arm was identified using ultrasound and appeared of sufficient size. A transverse incision was made above the elbow creese in the antecubital fossa. The basilic vein was identified and isolated for 4 cm in length.  The bicipital aponeurosis was partially released and the brachial artery was dissected free from its paired brachial veins and secured with a vessel loop. The  patient was heparinized. The basilic vein was marked and ligated distally with 2-0 silk, then flushed with heparinized saline. Vascular clamps were placed proximally and distally on the brachial artery and a 5 mm  arteriotomy  was created on the brachial artery. This was flushed with heparin  saline. The vein was juxtaposed to the artery and an anastomosis was created using 6-0 Prolene.  Prior to completing the anastomsis, the vessels were flushed and the suture line was tied down. There was an excellent doppler thrill in the basilic vein from the anastomosis into the upper arm and a multiphasic radial signal at the wrist. The incision was irrigated and hemostasis acheived. The deeper tissue was closed with 3-0 Vicryl and the skin closed with 4-0 Monocryl.   Dermabond was applied the incisions and the arm was ACE wrapped. The patient was transferred to PACU in stable condition.    COMPLICATIONS: None apparent  CONDITION: Stable  Philipp Brawn MD [facility] Phone Number: [phone] 10/09/2023 6:35 PM

## 2023-10-09 NOTE — Anesthesia Procedure Notes (Signed)
 Procedure Name: LMA Insertion Date/Time: 10/09/2023 4:51 PM  Performed by: Chanda Combes, CRNAPre-anesthesia Checklist: Patient identified, Emergency Drugs available, Suction available and Patient being monitored Patient Re-evaluated:Patient Re-evaluated prior to induction Oxygen Delivery Method: Circle System Utilized Preoxygenation: Pre-oxygenation with 100% oxygen Induction Type: IV induction Ventilation: Mask ventilation without difficulty LMA: LMA inserted LMA Size: 5.0 Number of attempts: 1 Placement Confirmation: positive ETCO2 Tube secured with: Tape Dental Injury: Teeth and Oropharynx as per pre-operative assessment

## 2023-10-09 NOTE — Progress Notes (Signed)
 [facility] PROGRESS NOTE   Calvin Schultz VWU:[id number] DOB: Sep 01, 1950 DOA: 10/01/2023  PCP: Audria Leather, MD  Brief History: PMH of CKD 5, chronic A-fib, T2DM, HLD, HTN, BPH presented to the hospital with complaints of worsening swelling of his legs. Was recently hospitalized for septic shock secondary to C. difficile colitis.  Lasix  was discontinued.  Presented to the hospital now with worsening swelling for last 6 weeks progressively worsening. Nephrology consulted.  Consultants: Nephrology  Procedures: None  Subjective/Interval History: No acute complaints.    Assessment/Plan:  ESRD Acute on chronic diastolic CHF Anasarca. Hypoalbuminemia with third spacing. Metabolic acidosis Baseline serum creatinine appears to be around 5.6.  On admission serum creatinine was 4.5  Nephrology was consulted.  Patient started on IV diuretics and IV albumin  as well. He has been diuresing.  However remains significantly volume overloaded.  Nephrology plans to initiate hemodialysis. Electrolyte management per nephrology.   Anemia of chronic kidney disease with acute worsening. Baseline hemoglobin 8.6.  On admission hemoglobin was 6.0.  Likely dilutional due to volume overload.  Patient was transfused 2 units of PRBC.  Hemoglobin stable for the most part.  No active bleeding noted. Check iron  panel   Positive Hemoccult Noted to have positive Hemoccult on 5/29 though stool was brown.  No overt bleeding noted.   Unclear when last colonoscopy    Bilateral heel ulcer Outpatient podiatry follow-up recommended, referral placed. Appreciate wound care in the hospital.    Essential hypertension Blood pressure is reasonably well-controlled.  Amlodipine  on hold.   BPH. Flomax .    Chronic muscle spasm. On Zanaflex .  Needed.  Obesity Estimated body mass index is 34.29 kg/m as calculated from the following:   Height as of this encounter: 5\' 10"  (1.778 m).   Weight as of this  encounter: 108.4 kg.   DVT Prophylaxis: Subcutaneous heparin  Code Status: Full code Family Communication: Discussed with the patient and spouse at bedside  Disposition Plan: SNF    Medications: Scheduled:  [MAR Hold] sodium chloride    Intravenous Once   [MAR Hold] Chlorhexidine  Gluconate Cloth  6 each Topical Q0600   [MAR Hold] darbepoetin (ARANESP ) injection - NON-DIALYSIS  100 mcg Subcutaneous Q Sat-1800   [MAR Hold] furosemide   80 mg Intravenous TID   [MAR Hold] heparin  injection (subcutaneous)  5,000 Units Subcutaneous Q8H   [MAR Hold] insulin  aspart  0-9 Units Subcutaneous TID WC   [MAR Hold] pantoprazole   40 mg Oral BID   [MAR Hold] tamsulosin   0.4 mg Oral QPC supper   Continuous:  sodium chloride      PRN:[MAR Hold] acetaminophen  **OR** [MAR Hold] acetaminophen , [MAR Hold] ondansetron  **OR** [MAR Hold] ondansetron  (ZOFRAN ) IV, [MAR Hold] mouth rinse, [MAR Hold] tiZANidine , [MAR Hold] traZODone    Objective:  Vital Signs  Vitals:   10/09/23 0408 10/09/23 0722 10/09/23 0757 10/09/23 1531  BP: 122/70  (!) 123/56 (!) 143/73  Pulse: 66  69 75  Resp: 18   16  Temp: 98.7 F (37.1 C)  (!) 95.5 F (35.3 C) 97.8 F (36.6 C)  TempSrc:    Oral  SpO2: 99%  96% 95%  Weight:  108.4 kg    Height:        Intake/Output Summary (Last 24 hours) at 10/09/2023 1600 Last data filed at 10/09/2023 1329 Gross per 24 hour  Intake 510 ml  Output 3000 ml  Net -2490 ml   Filed Weights   10/07/23 0451 10/08/23 2100 10/09/23 0722  Weight: 113.3 kg 109.2 kg 108.4  kg    Physical Exam  Constitutional: In no distress.  Cardiovascular: Normal rate, regular rhythm. 2+ bilateral lower extremity edema  Pulmonary: Non labored breathing on room air, no wheezing or rales.  Abdominal: Soft. Normal bowel sounds. Non distended and non tender  Neurological: Alert and oriented to person, place, and time.  Skin: Skin is warm and dry.   Lab Results:  Data Reviewed: I have personally reviewed  following labs and reports of the imaging studies  CBC: Recent Labs  Lab 10/03/23 1638 10/04/23 0350 10/05/23 0432 10/06/23 0507 10/07/23 0445  WBC 12.9* 11.5* 11.9* 13.0* 13.7*  HGB 8.4* 8.2* 8.1* 8.2* 8.1*  HCT 26.6* 25.6* 26.3* 25.8* 26.0*  MCV 85.0 84.8 86.5 86.6 88.7  PLT 272 256 245 252 218    Basic Metabolic Panel: Recent Labs  Lab 10/04/23 0350 10/05/23 0432 10/06/23 0507 10/07/23 0445 10/08/23 0518  NA 138 137 139 139 140  K 3.6 3.1* 3.4* 3.4* 3.4*  CL 105 104 106 104 103  CO2 21* 21* 22 23 25   GLUCOSE 93 99 136* 119* 123*  BUN 89* 89* 95* 96* 93*  CREATININE 4.69* 4.78* 4.94* 4.94* 4.98*  CALCIUM  8.3* 8.0* 8.4* 8.6* 8.8*  MG 1.9 1.9 1.8 1.8 1.7    GFR: Estimated Creatinine Clearance: 17 mL/min (A) (by C-G formula based on SCr of 4.98 mg/dL (H)).  Liver Function Tests: No results for input(s): "AST", "ALT", "ALKPHOS", "BILITOT", "PROT", "ALBUMIN " in the last 168 hours.   Coagulation Profile: No results for input(s): "INR", "PROTIME" in the last 168 hours.    CBG: Recent Labs  Lab 10/08/23 1059 10/08/23 1617 10/08/23 2004 10/09/23 0757 10/09/23 1143  GLUCAP 253* 127* 202* 112* 108*    Radiology Studies: No results found.     LOS: 8 days   [employer]  [facility] Pager on [email or web address]  10/09/2023, 4:00 PM

## 2023-10-09 NOTE — Discharge Instructions (Signed)
   [facility]  Discharge Instructions  AV Fistula or Graft Surgery for Dialysis Access  Please refer to the following instructions for your post-procedure care. Your surgeon or physician assistant will discuss any changes with you.  Activity  You may drive the day following your surgery, if you are comfortable and no longer taking prescription pain medication. Resume full activity as the soreness in your incision resolves.  Bathing/Showering  You may shower after you go home. Keep your incision dry for 48 hours. Do not soak in a bathtub, hot tub, or swim until the incision heals completely. You may not shower if you have a hemodialysis catheter.  Incision Care  Clean your incision with mild soap and water  after 48 hours. Pat the area dry with a clean towel. You do not need a bandage unless otherwise instructed. Do not apply any ointments or creams to your incision. You may have skin glue on your incision. Do not peel it off. It will come off on its own in about one week. Your arm may swell a bit after surgery. To reduce swelling use pillows to elevate your arm so it is above your heart. Your doctor will tell you if you need to lightly wrap your arm with an ACE bandage.  Diet  Resume your normal diet. There are not special food restrictions following this procedure. In order to heal from your surgery, it is CRITICAL to get adequate nutrition. Your body requires vitamins, minerals, and protein. Vegetables are the best source of vitamins and minerals. Vegetables also provide the perfect balance of protein. Processed food has little nutritional value, so try to avoid this.  Medications  Resume taking all of your medications. If your incision is causing pain, you may take over-the counter pain relievers such as acetaminophen  (Tylenol ). If you were prescribed a stronger pain medication, please be aware these medications can cause nausea and constipation. Prevent  nausea by taking the medication with a snack or meal. Avoid constipation by drinking plenty of fluids and eating foods with high amount of fiber, such as fruits, vegetables, and grains.  Do not take Tylenol  if you are taking prescription pain medications.  Follow up Your surgeon may want to see you in the office following your access surgery. If so, this will be arranged at the time of your surgery.  Please call us  immediately for any of the following conditions:  Increased pain, redness, drainage (pus) from your incision site Fever of 101 degrees or higher Severe or worsening pain at your incision site Hand pain or numbness.  Reduce your risk of vascular disease:  Stop smoking. If you would like help, call [facility] at 1-800-QUIT-NOW ((647)451-9453) or [place] at 210-217-4889  Manage your cholesterol Maintain a desired weight Control your diabetes Keep your blood pressure down  Dialysis  It will take several weeks to several months for your new dialysis access to be ready for use. Your surgeon will determine when it is okay to use it. Your nephrologist will continue to direct your dialysis. You can continue to use your Permcath until your new access is ready for use.   10/09/2023 Calvin Schultz [id number] Feb 21, 1951  Surgeon(s): Philipp Brawn, MD  Procedure(s): ARTERIOVENOUS (AV) FISTULA CREATION, FIRST STAGE BASILIC INSERTION OF DIALYSIS CATHETER, USING PALINDROME CATHETER KIT 19CM  x Do not stick fistula for 12 weeks    If you have any questions, please call the office at [phone].

## 2023-10-09 NOTE — Anesthesia Preprocedure Evaluation (Signed)
 Anesthesia Evaluation  Patient identified by MRN, date of birth, ID band Patient awake    Reviewed: Allergy & Precautions, H&P , NPO status , Patient's Chart, lab work & pertinent test results  History of Anesthesia Complications Negative for: history of anesthetic complications  Airway Mallampati: II  TM Distance: >3 FB Neck ROM: full    Dental  (+) Teeth Intact, Dental Advisory Given   Pulmonary neg pulmonary ROS   breath sounds clear to auscultation       Cardiovascular hypertension, (-) angina (-) Past MI + dysrhythmias Atrial Fibrillation  Rhythm:regular Rate:Normal   1. Left ventricular ejection fraction, by estimation, is 60 to 65%. The  left ventricle has normal function. The left ventricle has no regional  wall motion abnormalities. The left ventricular internal cavity size was  moderately dilated. There is moderate   left ventricular hypertrophy. Left ventricular diastolic parameters were  normal.   2. Right ventricular systolic function is normal. The right ventricular  size is mildly enlarged.   3. Left atrial size was mildly dilated.   4. Right atrial size was mildly dilated.   5. The mitral valve is normal in structure. Mild mitral valve  regurgitation. No evidence of mitral stenosis.   6. The aortic valve is tricuspid. Aortic valve regurgitation is not  visualized. Aortic valve sclerosis is present, with no evidence of aortic  valve stenosis.   7. Aortic dilatation noted. There is borderline dilatation of the aortic  root, measuring 38 mm.   8. The inferior vena cava is normal in size with greater than 50%  respiratory variability, suggesting right atrial pressure of 3 mmHg.     Neuro/Psych  PSYCHIATRIC DISORDERS Anxiety Depression    negative neurological ROS     GI/Hepatic negative GI ROS, Neg liver ROS,,,  Endo/Other  diabetes, Type 2     Renal/GU Renal Insufficiency and CRFRenal disease      Musculoskeletal  (+) Arthritis ,    Abdominal   Peds  Hematology  (+) Blood dyscrasia, anemia   Anesthesia Other Findings   Reproductive/Obstetrics                              Anesthesia Physical Anesthesia Plan  ASA: 3  Anesthesia Plan: General   Post-op Pain Management:    Induction: Intravenous  PONV Risk Score and Plan: 2 and Ondansetron , Dexamethasone  and Treatment may vary due to age or medical condition  Airway Management Planned: LMA  Additional Equipment: None  Intra-op Plan:   Post-operative Plan: Extubation in OR  Informed Consent: I have reviewed the patients History and Physical, chart, labs and discussed the procedure including the risks, benefits and alternatives for the proposed anesthesia with the patient or authorized representative who has indicated his/her understanding and acceptance.     Dental advisory given  Plan Discussed with: CRNA and Surgeon  Anesthesia Plan Comments:         Anesthesia Quick Evaluation

## 2023-10-09 NOTE — TOC Progression Note (Addendum)
 Transition of Care [facility]) - Progression Note    Patient Details  Name: Calvin Schultz MRN: [id number] Date of Birth: 01-15-1951  [facility] CM/SW Contact  Katrinka Parr, [place] Phone Number: 10/09/2023, 1:55 PM  Clinical Narrative:     CSW met with pt for SNF choice. Pt explains that he talked about facilities some with wife but states that he is not sure if she has made a decision yet.   CSW called and left voicemail with pt's spouse requesting return call.   1410: Received call back from pt's spouse. She provided choice for [facility]. CSW updated Alray Askew. Pt will need to be clipped. Alray Askew Place prefers [facility] [address], [facility], or [facility]. Renal navigator updated.    Expected Discharge Plan: Skilled Nursing Facility Barriers to Discharge: Continued Medical Work up, English as a second language teacher, SNF Pending bed offer  Expected Discharge Plan and Services       Living arrangements for the past 2 months: Single Family Home                                       Social Determinants of Health (SDOH) Interventions SDOH Screenings   Food Insecurity: No Food Insecurity (10/02/2023)  Housing: Low Risk  (10/02/2023)  Transportation Needs: No Transportation Needs (10/02/2023)  Utilities: Not At Risk (10/02/2023)  Social Connections: Moderately Isolated (10/02/2023)  Tobacco Use: Low Risk  (10/02/2023)    Readmission Risk Interventions    07/10/2023   11:21 AM 06/27/2023    1:00 PM  Readmission Risk Prevention Plan  Transportation Screening Complete Complete  PCP or Specialist Appt within 5-7 Days  Complete  PCP or Specialist Appt within 3-5 Days Complete   Home Care Screening  Complete  Medication Review (RN CM)  Complete  HRI or Home Care Consult Complete   Social Work Consult for Recovery Care Planning/Counseling Complete   Palliative Care Screening Not Applicable   Medication Review [facility]) Referral to Pharmacy

## 2023-10-09 NOTE — Progress Notes (Signed)
 BUE vein mapping has been completed.   Results can be found under chart review under CV PROC. 10/09/2023 4:24 PM Kelvis Berger RVT, RDMS

## 2023-10-09 NOTE — Progress Notes (Signed)
 Patient ID: Calvin Schultz, male   DOB: 1951-02-07, 73 y.o.   MRN: [id number] [facility] Progress Note   Assessment/ Plan:   1.  Anasarca/volume overload: This appears to be largely from acute exacerbation of diastolic heart failure with limited success/response to diuretics prompting need to initiate chronic hemodialysis in this patient with advanced chronic kidney disease. 2. ESRD: With acute kidney injury on chronic kidney disease stage V and recurrent volume overload.  Initiating dialysis starting today after placement of tunneled hemodialysis catheter/permanent access by vascular surgery.  Process underway for outpatient dialysis unit placement.  Will discontinue sodium bicarbonate . 3. Anemia: Secondary to chronic illness including chronic kidney disease, monitor on ESA.  Iron  stores adequate. 4. CKD-MBD: Calcium  level acceptable, will follow phosphorus levels upon starting dialysis to decide on need for phosphate binder therapy. 5. Nutrition: Continue renal diet with fluid restriction upon starting dialysis. 6. Hypertension: Blood pressures currently at goal, monitor with dialysis/ultrafiltration.  Subjective:   Reports to be feeling fair and understands plan to initiate dialysis.   Objective:   BP (!) 123/56 (BP Location: Right Arm)   Pulse 69   Temp (!) 95.5 F (35.3 C)   Resp 18   Ht 5\' 10"  (1.778 m)   Wt 108.4 kg   SpO2 96%   BMI 34.29 kg/m   Physical Exam: Gen: Comfortably resting in bed, awakens to conversation CVS: Pulse regular rhythm, normal rate, S1 and S2 normal Resp: Clear to auscultation bilaterally without distinct rales or rhonchi Abd: Soft, obese, nontender, bowel sounds normal Ext: 2-3+ anasarca  Labs: BMET Recent Labs  Lab 10/03/23 0336 10/04/23 0350 10/05/23 0432 10/06/23 0507 10/07/23 0445 10/08/23 0518  NA 136 138 137 139 139 140  K 3.5 3.6 3.1* 3.4* 3.4* 3.4*  CL 108 105 104 106 104 103  CO2 18* 21* 21* 22 23 25   GLUCOSE 99 93 99  136* 119* 123*  BUN 90* 89* 89* 95* 96* 93*  CREATININE 4.66* 4.69* 4.78* 4.94* 4.94* 4.98*  CALCIUM  7.8* 8.3* 8.0* 8.4* 8.6* 8.8*   CBC Recent Labs  Lab 10/02/23 1142 10/02/23 1145 10/04/23 0350 10/05/23 0432 10/06/23 0507 10/07/23 0445  WBC 13.6*   < > 11.5* 11.9* 13.0* 13.7*  NEUTROABS 11.3*  --   --   --   --   --   HGB 7.8*   < > 8.2* 8.1* 8.2* 8.1*  HCT 25.1*   < > 25.6* 26.3* 25.8* 26.0*  MCV 88.4   < > 84.8 86.5 86.6 88.7  PLT 314   < > 256 245 252 218   < > = values in this interval not displayed.      Medications:     sodium chloride    Intravenous Once   Chlorhexidine  Gluconate Cloth  6 each Topical Q0600   darbepoetin (ARANESP ) injection - NON-DIALYSIS  100 mcg Subcutaneous Q Sat-1800   furosemide   80 mg Intravenous TID   heparin  injection (subcutaneous)  5,000 Units Subcutaneous Q8H   insulin  aspart  0-9 Units Subcutaneous TID WC   pantoprazole   40 mg Oral BID   sodium bicarbonate   1,300 mg Oral TID   tamsulosin   0.4 mg Oral QPC supper   Clevester Dally, MD 10/09/2023, 10:53 AM

## 2023-10-09 NOTE — Progress Notes (Addendum)
  Progress Note    10/09/2023 9:14 AM * Day of Surgery *  Subjective:  no complaints    Vitals:   10/09/23 0408 10/09/23 0757  BP: 122/70 (!) 123/56  Pulse: 66 69  Resp: 18   Temp: 98.7 F (37.1 C) (!) 95.5 F (35.3 C)  SpO2: 99% 96%    Physical Exam: Cardiac:  regular Lungs:  nonlabored Extremities:  palpable radial and brachial pulses bilaterally  CBC    Component Value Date/Time   WBC 13.7 (H) 10/07/2023 0445   RBC 2.93 (L) 10/07/2023 0445   HGB 8.1 (L) 10/07/2023 0445   HGB 13.2 06/25/2021 1539   HCT 26.0 (L) 10/07/2023 0445   HCT 41.2 06/25/2021 1539   PLT 218 10/07/2023 0445   PLT 367 06/25/2021 1539   MCV 88.7 10/07/2023 0445   MCV 84 06/25/2021 1539   MCH 27.6 10/07/2023 0445   MCHC 31.2 10/07/2023 0445   RDW 17.7 (H) 10/07/2023 0445   RDW 12.6 06/25/2021 1539   LYMPHSABS 0.8 10/02/2023 1142   LYMPHSABS 2.4 06/25/2021 1539   MONOABS 1.2 (H) 10/02/2023 1142   EOSABS 0.2 10/02/2023 1142   EOSABS 0.2 06/25/2021 1539   BASOSABS 0.1 10/02/2023 1142   BASOSABS 0.1 06/25/2021 1539    BMET    Component Value Date/Time   NA 140 10/08/2023 0518   NA 139 06/25/2021 1539   K 3.4 (L) 10/08/2023 0518   CL 103 10/08/2023 0518   CO2 25 10/08/2023 0518   GLUCOSE 123 (H) 10/08/2023 0518   BUN 93 (H) 10/08/2023 0518   BUN 22 06/25/2021 1539   CREATININE 4.98 (H) 10/08/2023 0518   CALCIUM  8.8 (L) 10/08/2023 0518   GFRNONAA 12 (L) 10/08/2023 0518    INR    Component Value Date/Time   INR 1.2 10/02/2023 1145     Intake/Output Summary (Last 24 hours) at 10/09/2023 0914 Last data filed at 10/09/2023 0600 Gross per 24 hour  Intake 480 ml  Output 2400 ml  Net -1920 ml      Assessment/Plan:  73 y.o. male in need of dialysis access   -He is doing well this morning without any complaints -He is scheduled for [facility] placement and left arm AVF/AVG creation today in the OR. He remains agreeable for surgery -He has been NPO past midnight -All questions  answered   Deneise Finlay, PA-C [facility] [phone] 10/09/2023 9:14 AM   VASCULAR STAFF ADDENDUM: I have independently interviewed and examined the patient. I agree with the above.   Philipp Brawn MD [facility] Phone Number: [phone] 10/09/2023 4:24 PM

## 2023-10-09 NOTE — Progress Notes (Signed)
 Vein mapping done prior to pt arrival to short stay. IV was in operative arm. This was removed at this time after Dr. Susi Eric assessed pt to verify laterality after mapping.

## 2023-10-10 DIAGNOSIS — N186 End stage renal disease: Secondary | ICD-10-CM | POA: Diagnosis not present

## 2023-10-10 LAB — RENAL FUNCTION PANEL
Albumin: 2.5 g/dL — ABNORMAL LOW (ref 3.5–5.0)
Anion gap: 14 (ref 5–15)
BUN: 90 mg/dL — ABNORMAL HIGH (ref 8–23)
CO2: 25 mmol/L (ref 22–32)
Calcium: 8.8 mg/dL — ABNORMAL LOW (ref 8.9–10.3)
Chloride: 98 mmol/L (ref 98–111)
Creatinine, Ser: 4.74 mg/dL — ABNORMAL HIGH (ref 0.61–1.24)
GFR, Estimated: 13 mL/min — ABNORMAL LOW (ref >60)
Glucose, Bld: 215 mg/dL — ABNORMAL HIGH (ref 70–99)
Phosphorus: 6.4 mg/dL — ABNORMAL HIGH (ref 2.5–4.6)
Potassium: 3.7 mmol/L (ref 3.5–5.1)
Sodium: 137 mmol/L (ref 135–145)

## 2023-10-10 LAB — CBC
HCT: 28.2 % — ABNORMAL LOW (ref 39.0–52.0)
Hemoglobin: 8.6 g/dL — ABNORMAL LOW (ref 13.0–17.0)
MCH: 27.2 pg (ref 26.0–34.0)
MCHC: 30.5 g/dL (ref 30.0–36.0)
MCV: 89.2 fL (ref 80.0–100.0)
Platelets: 222 10*3/uL (ref 150–400)
RBC: 3.16 MIL/uL — ABNORMAL LOW (ref 4.22–5.81)
RDW: 17.1 % — ABNORMAL HIGH (ref 11.5–15.5)
WBC: 10.7 10*3/uL — ABNORMAL HIGH (ref 4.0–10.5)
nRBC: 0 % (ref 0.0–0.2)

## 2023-10-10 LAB — GLUCOSE, CAPILLARY
Glucose-Capillary: 238 mg/dL — ABNORMAL HIGH (ref 70–99)
Glucose-Capillary: 149 mg/dL — ABNORMAL HIGH (ref 70–99)
Glucose-Capillary: 237 mg/dL — ABNORMAL HIGH (ref 70–99)
Glucose-Capillary: 184 mg/dL — ABNORMAL HIGH (ref 70–99)

## 2023-10-10 LAB — MAGNESIUM: Magnesium: 1.8 mg/dL (ref 1.7–2.4)

## 2023-10-10 MED ORDER — MORPHINE SULFATE (PF) 2 MG/ML IV SOLN
INTRAVENOUS | Status: AC
  Filled 2023-10-10: qty 1

## 2023-10-10 MED ORDER — ANTICOAGULANT SODIUM CITRATE 4% (200MG/5ML) IV SOLN: 5.0000 mL | Status: DC | PRN

## 2023-10-10 MED ORDER — LIDOCAINE HCL (PF) 1 % IJ SOLN
5.0000 mL | INTRAMUSCULAR | Status: DC | PRN
Start: 2023-10-10 — End: 2023-10-10

## 2023-10-10 MED ORDER — FUROSEMIDE 40 MG PO TABS
80.0000 mg | ORAL_TABLET | Freq: Two times a day (BID) | ORAL | Status: DC
  Administered 2023-10-10 – 2023-10-13 (×6): 80 mg via ORAL
  Filled 2023-10-10 (×7): qty 2

## 2023-10-10 MED ORDER — HEPARIN SODIUM (PORCINE) 1000 UNIT/ML IJ SOLN
INTRAMUSCULAR | Status: AC
Start: 2023-10-10 — End: ?
  Filled 2023-10-10: qty 4

## 2023-10-10 MED ORDER — PENTAFLUOROPROP-TETRAFLUOROETH EX AERO: 1.0000 | INHALATION_SPRAY | CUTANEOUS | Status: DC | PRN

## 2023-10-10 MED ORDER — HEPARIN SODIUM (PORCINE) 1000 UNIT/ML IJ SOLN
3200.0000 [IU] | Freq: Once | INTRAMUSCULAR | Status: AC
  Administered 2023-10-10: 3200 [IU]

## 2023-10-10 MED ORDER — HEPARIN SODIUM (PORCINE) 1000 UNIT/ML DIALYSIS: 1000.0000 [IU] | INTRAMUSCULAR | Status: DC | PRN

## 2023-10-10 MED ORDER — ALTEPLASE 2 MG IJ SOLR: 2.0000 mg | Freq: Once | INTRAMUSCULAR | Status: DC | PRN

## 2023-10-10 MED ORDER — LIDOCAINE-PRILOCAINE 2.5-2.5 % EX CREA
1.0000 | TOPICAL_CREAM | CUTANEOUS | Status: DC | PRN
Start: 2023-10-10 — End: 2023-10-10

## 2023-10-10 MED ORDER — POLYVINYL ALCOHOL 1.4 % OP SOLN: 1.0000 [drp] | OPHTHALMIC | Status: DC | PRN

## 2023-10-10 NOTE — Progress Notes (Signed)
 Patient ID: Calvin Schultz, male   DOB: 05/27/1950, 73 y.o.   MRN: [id number] [facility] Progress Note   Assessment/ Plan:   1.  Anasarca/volume overload: This appears to be largely from acute exacerbation of diastolic heart failure with limited success/response to diuretics prompting need to initiate chronic hemodialysis in this patient with advanced chronic kidney disease. 2. ESRD: With acute kidney injury on chronic kidney disease stage V and recurrent volume overload.  Initiating dialysis starting today after placement of RIJ TDC and LUA AVF (1st stage BBF) yesterday by Dr. Susi Eric.  Process underway for outpatient dialysis unit placement. 3. Anemia: Secondary to chronic illness including chronic kidney disease, monitor on ESA.  Iron  stores adequate. 4. CKD-MBD: Calcium  level acceptable, will follow phosphorus levels upon starting dialysis to decide on need for phosphate binder therapy. 5. Nutrition: Continue renal diet with fluid restriction upon starting dialysis. 6. Hypertension: Blood pressures currently at goal, monitor with dialysis/ultrafiltration and switch to PO furosemide .  Subjective:   Reports to be feeling fair with some left elbow discomfort overnight.    Objective:   BP 104/75   Pulse 66   Temp 97.7 F (36.5 C)   Resp 14   Ht 5\' 10"  (1.778 m)   Wt 102.2 kg   SpO2 94%   BMI 32.33 kg/m   Physical Exam: Gen: Comfortably resting in dialysis.  CVS: Pulse regular rhythm, normal rate, S1 and S2 normal. RIJ TDC connected to HD Resp: Clear to auscultation bilaterally without distinct rales or rhonchi Abd: Soft, obese, nontender, bowel sounds normal Ext: 2+ anasarca with 1st stage LUA BBF  Labs: BMET Recent Labs  Lab 10/04/23 0350 10/05/23 0432 10/06/23 0507 10/07/23 0445 10/08/23 0518 10/09/23 1629 10/10/23 0830  NA 138 137 139 139 140 140 137  K 3.6 3.1* 3.4* 3.4* 3.4* 4.3 3.7  CL 105 104 106 104 103 98 98  CO2 21* 21* 22 23 25   --  25  GLUCOSE  93 99 136* 119* 123* 105* 215*  BUN 89* 89* 95* 96* 93* 105* 90*  CREATININE 4.69* 4.78* 4.94* 4.94* 4.98* 4.80* 4.74*  CALCIUM  8.3* 8.0* 8.4* 8.6* 8.8*  --  8.8*  PHOS  --   --   --   --   --   --  6.4*   CBC Recent Labs  Lab 10/05/23 0432 10/06/23 0507 10/07/23 0445 10/09/23 1629 10/10/23 0830  WBC 11.9* 13.0* 13.7*  --  10.7*  HGB 8.1* 8.2* 8.1* 10.2* 8.6*  HCT 26.3* 25.8* 26.0* 30.0* 28.2*  MCV 86.5 86.6 88.7  --  89.2  PLT 245 252 218  --  222      Medications:     sodium chloride    Intravenous Once   Chlorhexidine  Gluconate Cloth  6 each Topical Q0600   darbepoetin (ARANESP ) injection - NON-DIALYSIS  100 mcg Subcutaneous Q Sat-1800   furosemide   80 mg Intravenous TID   heparin  injection (subcutaneous)  5,000 Units Subcutaneous Q8H   heparin  sodium (porcine)  3,200 Units Intracatheter Once   insulin  aspart  0-9 Units Subcutaneous TID WC   pantoprazole   40 mg Oral BID   tamsulosin   0.4 mg Oral QPC supper   Clevester Dally, MD 10/10/2023, 9:37 AM

## 2023-10-10 NOTE — Progress Notes (Signed)
 [facility] PROGRESS NOTE   Calvin Schultz ION:[id number] DOB: [birth year] DOA: 10/01/2023  PCP: Audria Leather, MD  Brief History: PMH of CKD 5, chronic A-fib, T2DM, HLD, HTN, BPH presented to the hospital with complaints of worsening swelling of his legs. Was recently hospitalized for septic shock secondary to C. difficile colitis.  Lasix  was discontinued.  Presented to the hospital now with worsening swelling for last 6 weeks progressively worsening. Nephrology consulted.  Consultants: Nephrology  Procedures: None  Subjective/Interval History: No current complaints.    Assessment/Plan:  ESRD Acute on chronic diastolic CHF Anasarca. Hypoalbuminemia with third spacing. Metabolic acidosis Resolved.  Baseline serum creatinine appears to be around 5.6.  On admission serum creatinine was 4.5  Nephrology was consulted.  Patient started on IV diuretics and IV albumin  as well. He has been diuresing.  However remained significantly volume overloaded. Started HD 6/7   Anemia of chronic kidney disease with acute worsening. Baseline hemoglobin 8.6.  On admission hemoglobin was 6.0.  Likely dilutional due to volume overload.  Patient was transfused 2 units of PRBC.  No active bleeding noted.    Latest Ref Rng & Units 10/10/2023    8:30 AM 10/09/2023    4:29 PM 10/07/2023    4:45 AM  CBC  WBC 4.0 - 10.5 K/uL 10.7   13.7   Hemoglobin 13.0 - 17.0 g/dL 8.6  24.4  8.1   Hematocrit 39.0 - 52.0 % 28.2  30.0  26.0   Platelets 150 - 400 K/uL 222   218    Hgb stable. 10.2 likely erroneous.   Positive Hemoccult Noted to have positive Hemoccult on 5/29 though stool was brown.  No overt bleeding noted.   Unclear when last colonoscopy  -Will need to pursue colonoscopy in the outpt setting    Bilateral heel ulcer Outpatient podiatry follow-up recommended, referral placed. Appreciate wound care in the hospital.    Essential hypertension Blood pressure is reasonably well-controlled.   Amlodipine  on hold.   BPH. Flomax .    Chronic muscle spasm. On Zanaflex .  Needed.  Obesity Estimated body mass index is 32.49 kg/m as calculated from the following:   Height as of this encounter: 5\' 10"  (1.778 m).   Weight as of this encounter: 102.7 kg.   DVT Prophylaxis: Subcutaneous heparin  Code Status: Full code Family Communication: Discussed with the patient  Disposition Plan: SNF    Medications: Scheduled:  sodium chloride    Intravenous Once   Chlorhexidine  Gluconate Cloth  6 each Topical Q0600   darbepoetin (ARANESP ) injection - NON-DIALYSIS  100 mcg Subcutaneous Q Sat-1800   furosemide   80 mg Oral BID   heparin  injection (subcutaneous)  5,000 Units Subcutaneous Q8H   insulin  aspart  0-9 Units Subcutaneous TID WC   pantoprazole   40 mg Oral BID   tamsulosin   0.4 mg Oral QPC supper   Continuous:   PRN:acetaminophen  **OR** acetaminophen , artificial tears, morphine  injection, ondansetron  **OR** ondansetron  (ZOFRAN ) IV, mouth rinse, tiZANidine , traZODone    Objective:  Vital Signs  Vitals:   10/10/23 1041 10/10/23 1045 10/10/23 1049 10/10/23 1640  BP: (!) 104/58 (!) 111/58  91/69  Pulse: 65 64  63  Resp: 11 15  18   Temp: 97.6 F (36.4 C)     TempSrc:      SpO2: 96% 98%  98%  Weight:   102.7 kg   Height:        Intake/Output Summary (Last 24 hours) at 10/10/2023 1752 Last data filed at 10/10/2023 1400 Gross  per 24 hour  Intake 1200 ml  Output 4250 ml  Net -3050 ml   Filed Weights   10/09/23 0722 10/10/23 0821 10/10/23 1049  Weight: 108.4 kg 102.2 kg 102.7 kg     Physical Exam  Constitutional: In no distress.  Cardiovascular: Normal rate, regular rhythm. 2+ bilateral lower extremity edema to hips  Pulmonary: Non labored breathing on room air, no wheezing or rales.   Abdominal: Soft. Normal bowel sounds. Non distended and non tender   Neurological: Alert and oriented to person, place, and time. Decreased strength with hip flexors  Skin: Skin is  warm and dry.    Lab Results:  Data Reviewed: I have personally reviewed following labs and reports of the imaging studies  CBC: Recent Labs  Lab 10/04/23 0350 10/05/23 0432 10/06/23 0507 10/07/23 0445 10/09/23 1629 10/10/23 0830  WBC 11.5* 11.9* 13.0* 13.7*  --  10.7*  HGB 8.2* 8.1* 8.2* 8.1* 10.2* 8.6*  HCT 25.6* 26.3* 25.8* 26.0* 30.0* 28.2*  MCV 84.8 86.5 86.6 88.7  --  89.2  PLT 256 245 252 218  --  222    Basic Metabolic Panel: Recent Labs  Lab 10/05/23 0432 10/06/23 0507 10/07/23 0445 10/08/23 0518 10/09/23 1629 10/10/23 0830  NA 137 139 139 140 140 137  K 3.1* 3.4* 3.4* 3.4* 4.3 3.7  CL 104 106 104 103 98 98  CO2 21* 22 23 25   --  25  GLUCOSE 99 136* 119* 123* 105* 215*  BUN 89* 95* 96* 93* 105* 90*  CREATININE 4.78* 4.94* 4.94* 4.98* 4.80* 4.74*  CALCIUM  8.0* 8.4* 8.6* 8.8*  --  8.8*  MG 1.9 1.8 1.8 1.7  --  1.8  PHOS  --   --   --   --   --  6.4*    GFR: Estimated Creatinine Clearance: 17.4 mL/min (A) (by C-G formula based on SCr of 4.74 mg/dL (H)).  Liver Function Tests: Recent Labs  Lab 10/10/23 0830  ALBUMIN  2.5*     Coagulation Profile: No results for input(s): "INR", "PROTIME" in the last 168 hours.    CBG: Recent Labs  Lab 10/09/23 1855 10/09/23 1952 10/10/23 0735 10/10/23 1135 10/10/23 1642  GLUCAP 110* 131* 238* 149* 237*    Radiology Studies: DG Chest Port 1 View Result Date: 10/09/2023 CLINICAL DATA:  Status post dialysis catheter insertion EXAM: PORTABLE CHEST 1 VIEW COMPARISON:  10/01/2023 FINDINGS: Stable cardiomegaly. Perihilar predominant airspace and interstitial opacities suggestive of edema similar to prior. Small left pleural effusion. No pneumothorax. Interval placement of a right IJ dialysis catheter with tip at the superior cavoatrial junction. IMPRESSION: 1. Right IJ dialysis catheter tip at the superior cavoatrial junction. No pneumothorax. 2. Similar pulmonary edema and small left pleural effusion.  Electronically Signed   By: Rozell Cornet M.D.   On: 10/09/2023 19:08   DG C-Arm 1-60 Min Result Date: 10/09/2023 CLINICAL DATA:  Insertion of dialysis catheter using palindrome catheter kit EXAM: DG C-ARM 1-60 MIN FLUOROSCOPY: Fluoroscopy Time:  26.6 seconds Radiation Exposure Index (if provided by the fluoroscopic device): 7.4 mGy Number of Acquired Spot Images: 1 COMPARISON:  None Available. FINDINGS: Single intra procedural fluoroscopic image during insertion of a dialysis catheter. The catheter tip projects at the level of the low IVC. IMPRESSION: Single intra procedural fluoroscopic image during insertion of a dialysis catheter. The catheter tip projects at the level of the low IVC. Electronically Signed   By: Rozell Cornet M.D.   On: 10/09/2023 19:05  VAS US  UPPER EXT VEIN MAPPING (PRE-OP  AVF) Result Date: 10/09/2023 UPPER EXTREMITY VEIN MAPPING Patient Name:  SOLOMAN MCKEITHAN  Date of Exam:   10/09/2023 Medical Rec #: [id number]    Accession #:    [id number] Date of Birth: [birth year]   Patient Gender: M Patient Age:   73 years Exam Location:  [facility] Procedure:      VAS US  UPPER EXT VEIN MAPPING (PRE-OP  AVF) Referring Phys: Adrian Alba --------------------------------------------------------------------------------  Indications: Pre-access. History: ESRD - need for permanant access.  Limitations: subcutaneous edema (LUE) Comparison Study: No previous exams Performing Technologist: Jody Hill RVT, RDMS  Examination Guidelines: A complete evaluation includes B-mode imaging, spectral Doppler, color Doppler, and power Doppler as needed of all accessible portions of each vessel. Bilateral testing is considered an integral part of a complete examination. Limited examinations for reoccurring indications may be performed as noted. +-----------------+-------------+----------+---------+ Right Cephalic   Diameter (cm)Depth (cm)Findings  +-----------------+-------------+----------+---------+  Shoulder             0.28        1.19             +-----------------+-------------+----------+---------+ Prox upper arm       0.35        1.06             +-----------------+-------------+----------+---------+ Mid upper arm        0.17        0.52   branching +-----------------+-------------+----------+---------+ Dist upper arm       0.15        0.38             +-----------------+-------------+----------+---------+ Antecubital fossa    0.30        0.36             +-----------------+-------------+----------+---------+ Prox forearm         0.17        0.65   branching +-----------------+-------------+----------+---------+ Mid forearm          0.17        0.62             +-----------------+-------------+----------+---------+ Dist forearm         0.12        0.34             +-----------------+-------------+----------+---------+ Wrist                0.17        0.33             +-----------------+-------------+----------+---------+ +-----------------+-------------+----------+---------+ Right Basilic    Diameter (cm)Depth (cm)Findings  +-----------------+-------------+----------+---------+ Prox upper arm       0.65        1.42             +-----------------+-------------+----------+---------+ Mid upper arm        0.36        1.57   branching +-----------------+-------------+----------+---------+ Dist upper arm       0.33        1.20   branching +-----------------+-------------+----------+---------+ Antecubital fossa    0.17        1.00             +-----------------+-------------+----------+---------+ Prox forearm         0.15        0.50             +-----------------+-------------+----------+---------+ Mid forearm          0.13  0.39             +-----------------+-------------+----------+---------+ Distal forearm       0.14        0.26             +-----------------+-------------+----------+---------+ Wrist                 0.12        0.22             +-----------------+-------------+----------+---------+ +-----------------+-------------+----------+----------------------------+ Left Cephalic    Diameter (cm)Depth (cm)          Findings           +-----------------+-------------+----------+----------------------------+ Shoulder             0.22        1.30                                +-----------------+-------------+----------+----------------------------+ Prox upper arm       0.19        0.94                                +-----------------+-------------+----------+----------------------------+ Mid upper arm        0.20        0.90            branching           +-----------------+-------------+----------+----------------------------+ Dist upper arm       0.20        0.73                                +-----------------+-------------+----------+----------------------------+ Antecubital fossa    0.50        0.28   branching and acute thrombus +-----------------+-------------+----------+----------------------------+ Prox forearm         0.22        0.86                                +-----------------+-------------+----------+----------------------------+ Mid forearm          0.19        0.76                                +-----------------+-------------+----------+----------------------------+ Dist forearm         0.29        0.43          acute thrombus        +-----------------+-------------+----------+----------------------------+ Wrist                0.13        0.36                                +-----------------+-------------+----------+----------------------------+ +-----------------+-------------+----------+--------------+ Left Basilic     Diameter (cm)Depth (cm)   Findings    +-----------------+-------------+----------+--------------+ Prox upper arm       0.42        1.25                  +-----------------+-------------+----------+--------------+ Mid  upper arm        0.37  1.53     branching    +-----------------+-------------+----------+--------------+ Dist upper arm       0.31        1.04     branching    +-----------------+-------------+----------+--------------+ Antecubital fossa    0.25        1.01                  +-----------------+-------------+----------+--------------+ Prox forearm         0.26        1.20                  +-----------------+-------------+----------+--------------+ Mid forearm                             not visualized +-----------------+-------------+----------+--------------+ Distal forearm       0.17        0.74                  +-----------------+-------------+----------+--------------+ Wrist                0.12        0.43                  +-----------------+-------------+----------+--------------+ IV placement to LUE cephalic vein distal forearm. Subcutaneous edema of LUE (AC and forearm). Summary:  Left: Incidental finding of acute cephalic SVT. *See table(s) above for measurements and observations.  Diagnosing physician: Delaney Fearing Electronically signed by Delaney Fearing on 10/09/2023 at 6:46:41 PM.    Final        LOS: 9 days   Joette Mustard  [facility] Pager on [email or web address]  10/10/2023, 5:52 PM

## 2023-10-10 NOTE — Procedures (Signed)
 Patient seen on Hemodialysis. BP 104/75   Pulse 66   Temp 97.7 F (36.5 C)   Resp 14   Ht 5\' 10"  (1.778 m)   Wt 102.2 kg   SpO2 94%   BMI 32.33 kg/m   QB 200, UF goal 1L Tolerating treatment without complaints at this time.   Clevester Dally MD [facility]. Office # [phone] Pager # [phone] 9:37 AM

## 2023-10-10 NOTE — Plan of Care (Signed)
 Patient did well today.  First dialysis treatment. Tolerated well.  Vitals remained stable.

## 2023-10-10 NOTE — Progress Notes (Addendum)
  Postoperative hemodialysis access     Date of Surgery:  10/09/2023 Surgeon: Susi Eric  Subjective:  says he is sore around his left elbow; denies pain in the left hand  PHYSICAL EXAMINATION:  Vitals:   10/09/23 1949 10/10/23 0531  BP: 126/68 118/65  Pulse: 64 64  Resp: 18 18  Temp: (!) 97.4 F (36.3 C) (!) 97.5 F (36.4 C)  SpO2: 100% 100%    Incision is clean and dry Sensation in digits is intact;  He has some mild fullness around the incision but this is very soft;  thrill faintly palpable There is bruit. The radial pulse is not palpable   ASSESSMENT/PLAN:  Calvin Schultz is a 73 y.o. year old male who is s/p right [facility] placement and left 1st stage BVT on 10/09/2023 by Dr. Susi Eric.  -incision looks good.  He does have some fullness around incision but this is very soft, skin is not tense. - fistula is patent.  Discussed returning in 6 weeks for duplex.  Discussed that if it has matured, we would schedule him for 2nd stage.  Discussed that he may need additional intervention if not matured yet. He expressed understanding. -pt does not have evidence of steal sx.  I did discuss steal and if he develops severe sx, he would need to contact us .  -f/u with [facility] in 6 weeks to check maturation of AVF -will sign off-call as needed.   Maryanna Smart, PA-C [facility] [phone]  VASCULAR STAFF ADDENDUM: I have independently interviewed and examined the patient. I agree with the above.   Philipp Brawn MD [facility] Phone Number: [phone] 10/10/2023 8:55 AM

## 2023-10-10 NOTE — Progress Notes (Signed)
 Received patient in bed to unit.  Alert and oriented.  Informed consent signed and in chart.   TX duration: 2 hours  Patient tolerated well.  Transported back to the room  Alert, without acute distress.  Hand-off given to patient's nurse.   Access used: Right HD Cath internal jugular  Access issues: none  Total UF removed: 1L Medication(s) given: Morphine , see MAR   10/10/23 1041  Vitals  Temp 97.6 F (36.4 C)  BP (!) 104/58  Pulse Rate 65  Resp 11  Oxygen Therapy  SpO2 96 %  O2 Device Room Air  Patient Activity (if Appropriate) In bed  Pulse Oximetry Type Continuous  During Treatment Monitoring  Duration of HD Treatment -hour(s) 2 hour(s)  HD Safety Checks Performed Yes  Intra-Hemodialysis Comments Tx completed  Dialysis Fluid Bolus Normal Saline  Bolus Amount (mL) 300 mL  Post Treatment  Dialyzer Clearance Clear  Liters Processed 36  Fluid Removed (mL) 1000 mL  Tolerated HD Treatment Yes  Hemodialysis Catheter Right Internal jugular Double lumen Permanent (Tunneled)  Placement Date/Time: 10/09/23 1732   Placed prior to admission: No  Serial / Lot #: [id number]  Expiration Date: 07/02/28  Time Out: Correct patient;Correct site;Correct procedure  Maximum sterile barrier precautions: Sterile gloves;Sterile gown;Mask;S...  Site Condition No complications  Blue Lumen Status Flushed;Heparin  locked;Dead end cap in place  Red Lumen Status Infusing;Heparin  locked;Dead end cap in place  Purple Lumen Status N/A  Catheter fill solution Heparin  1000 units/ml  Catheter fill volume (Arterial) 1.6 cc  Catheter fill volume (Venous) 1.6  Dressing Type Transparent  Dressing Status Antimicrobial disc/dressing in place;Clean, Dry, Intact  Drainage Description None  Dressing Change Due 10/17/23  Post treatment catheter status Capped and Clamped     Luciano Ruths LPN [facility]

## 2023-10-11 DIAGNOSIS — N186 End stage renal disease: Secondary | ICD-10-CM | POA: Diagnosis not present

## 2023-10-11 LAB — IRON AND TIBC
Iron: 37 ug/dL — ABNORMAL LOW (ref 45–182)
Saturation Ratios: 17 % — ABNORMAL LOW (ref 17.9–39.5)
TIBC: 224 ug/dL — ABNORMAL LOW (ref 250–450)
UIBC: 187 ug/dL

## 2023-10-11 LAB — CBC
HCT: 28 % — ABNORMAL LOW (ref 39.0–52.0)
Hemoglobin: 8.5 g/dL — ABNORMAL LOW (ref 13.0–17.0)
MCH: 27.3 pg (ref 26.0–34.0)
MCHC: 30.4 g/dL (ref 30.0–36.0)
MCV: 90 fL (ref 80.0–100.0)
Platelets: 209 10*3/uL (ref 150–400)
RBC: 3.11 MIL/uL — ABNORMAL LOW (ref 4.22–5.81)
RDW: 17.3 % — ABNORMAL HIGH (ref 11.5–15.5)
WBC: 10.7 10*3/uL — ABNORMAL HIGH (ref 4.0–10.5)
nRBC: 0 % (ref 0.0–0.2)

## 2023-10-11 LAB — FERRITIN: Ferritin: 115 ng/mL (ref 24–336)

## 2023-10-11 LAB — GLUCOSE, CAPILLARY
Glucose-Capillary: 120 mg/dL — ABNORMAL HIGH (ref 70–99)
Glucose-Capillary: 158 mg/dL — ABNORMAL HIGH (ref 70–99)
Glucose-Capillary: 144 mg/dL — ABNORMAL HIGH (ref 70–99)
Glucose-Capillary: 144 mg/dL — ABNORMAL HIGH (ref 70–99)

## 2023-10-11 MED ORDER — FERROUS SULFATE 325 (65 FE) MG PO TABS: 325.0000 mg | ORAL_TABLET | Freq: Every day | ORAL | Status: DC

## 2023-10-11 MED ORDER — BISACODYL 5 MG PO TBEC
5.0000 mg | DELAYED_RELEASE_TABLET | Freq: Every day | ORAL | Status: DC | PRN
  Administered 2023-10-11: 5 mg via ORAL
  Filled 2023-10-11: qty 1

## 2023-10-11 MED ORDER — POLYETHYLENE GLYCOL 3350 17 G PO PACK: 17.0000 g | PACK | Freq: Every day | ORAL | Status: DC

## 2023-10-11 NOTE — Progress Notes (Signed)
 Patient ID: Calvin Schultz, male   DOB: Sep 15, 1950, 73 y.o.   MRN: [id number] [facility] Progress Note   Assessment/ Plan:   1.  Anasarca/volume overload: Secondary to acute exacerbation of diastolic heart failure with limited success/response to diuretics in the setting of declining renal function prompting need to initiate chronic hemodialysis.  Continued furosemide  80 mg twice a day to help augment urine output. 2. ESRD: With acute kidney injury on chronic kidney disease stage V and recurrent volume overload.  Started on hemodialysis (first treatment 10/10/2023) after placement of RIJ TDC and LUA AVF (1st stage BBF) on 10/09/2023 by Dr. Susi Eric.  Process underway for outpatient dialysis unit placement.  Will order for his second hemodialysis treatment tomorrow. 3. Anemia: Secondary to chronic illness including chronic kidney disease, monitor on ESA.  Iron  stores adequate. 4. CKD-MBD: Calcium  level acceptable with elevated phosphorus level-we will continue to trend this with hemodialysis to decide on need for adding binder. 5. Nutrition: Continue renal diet with fluid restriction upon starting dialysis. 6. Hypertension: Blood pressures currently at goal, monitor with dialysis/ultrafiltration and switch to PO furosemide .  Subjective:   Tolerated dialysis yesterday without problems.  No questions or concerns at this time.   Objective:   BP 107/68 (BP Location: Right Arm)   Pulse 70   Temp 98.3 F (36.8 C) (Oral)   Resp 17   Ht 5\' 10"  (1.778 m)   Wt 102.9 kg   SpO2 97%   BMI 32.55 kg/m   Physical Exam: Gen: Comfortably sitting up in bed, watching videos on phone CVS: Pulse regular rhythm, normal rate, S1 and S2 normal. RIJ TDC  Resp: Clear to auscultation bilaterally without distinct rales or rhonchi Abd: Soft, obese, nontender, bowel sounds normal Ext: 2+ anasarca with 1st stage LUA BBF  Labs: BMET Recent Labs  Lab 10/05/23 0432 10/06/23 0507 10/07/23 0445  10/08/23 0518 10/09/23 1629 10/10/23 0830  NA 137 139 139 140 140 137  K 3.1* 3.4* 3.4* 3.4* 4.3 3.7  CL 104 106 104 103 98 98  CO2 21* 22 23 25   --  25  GLUCOSE 99 136* 119* 123* 105* 215*  BUN 89* 95* 96* 93* 105* 90*  CREATININE 4.78* 4.94* 4.94* 4.98* 4.80* 4.74*  CALCIUM  8.0* 8.4* 8.6* 8.8*  --  8.8*  PHOS  --   --   --   --   --  6.4*   CBC Recent Labs  Lab 10/06/23 0507 10/07/23 0445 10/09/23 1629 10/10/23 0830 10/11/23 0712  WBC 13.0* 13.7*  --  10.7* 10.7*  HGB 8.2* 8.1* 10.2* 8.6* 8.5*  HCT 25.8* 26.0* 30.0* 28.2* 28.0*  MCV 86.6 88.7  --  89.2 90.0  PLT 252 218  --  222 209      Medications:     sodium chloride    Intravenous Once   Chlorhexidine  Gluconate Cloth  6 each Topical Q0600   darbepoetin (ARANESP ) injection - NON-DIALYSIS  100 mcg Subcutaneous Q Sat-1800   furosemide   80 mg Oral BID   heparin  injection (subcutaneous)  5,000 Units Subcutaneous Q8H   insulin  aspart  0-9 Units Subcutaneous TID WC   pantoprazole   40 mg Oral BID   tamsulosin   0.4 mg Oral QPC supper   Clevester Dally, MD 10/11/2023, 9:19 AM

## 2023-10-11 NOTE — Progress Notes (Signed)
 [facility] PROGRESS NOTE   Calvin Schultz ZOX:[id number] DOB: [birth year] DOA: 10/01/2023  PCP: Audria Leather, MD  Brief History: PMH of CKD 5, chronic A-fib, T2DM, HLD, HTN, BPH presented to the hospital with complaints of worsening swelling of his legs. Was recently hospitalized for septic shock secondary to C. difficile colitis.  Lasix  was discontinued.  Presented to the hospital now with worsening swelling for last 6 weeks progressively worsening. Nephrology consulted.  Consultants: Nephrology  Procedures: None  Subjective/Interval History: No SOB or chest pain.    Assessment/Plan:  ESRD Acute on chronic diastolic CHF Anasarca. Hypoalbuminemia . Metabolic acidosis Resolved.  Patient remains signficantly volume overloaded. Plan for HD in the AM    Anemia of chronic kidney disease  Baseline hemoglobin 8.6.  On admission hemoglobin was 6.0.  Likely dilutional due to volume overload.  Patient was transfused 2 units of PRBC.  No active bleeding noted.    Latest Ref Rng & Units 10/11/2023    7:12 AM 10/10/2023    8:30 AM 10/09/2023    4:29 PM  CBC  WBC 4.0 - 10.5 K/uL 10.7  10.7    Hemoglobin 13.0 - 17.0 g/dL 8.5  8.6  81.1   Hematocrit 39.0 - 52.0 % 28.0  28.2  30.0   Platelets 150 - 400 K/uL 209  222     Hgb stable.   Iron /TIBC/Ferritin/ %Sat    Component Value Date/Time   IRON  37 (L) 10/11/2023 0712   TIBC 224 (L) 10/11/2023 0712   FERRITIN 115 10/11/2023 0712   IRONPCTSAT 17 (L) 10/11/2023 0712     Positive Hemoccult Noted to have positive Hemoccult on 5/29 though stool was brown.  No overt bleeding noted.   Unclear when last colonoscopy  -Will need to pursue colonoscopy in the outpt setting    Bilateral heel ulcer Outpatient podiatry follow-up recommended, referral placed. Appreciate wound care in the hospital.    Essential hypertension Blood pressure is reasonably well-controlled.  Amlodipine  on hold.   BPH. Flomax .    Chronic muscle  spasm. On Zanaflex .  Needed.  Obesity Estimated body mass index is 32.49 kg/m as calculated from the following:   Height as of this encounter: 5\' 10"  (1.778 m).   Weight as of this encounter: 102.7 kg.   DVT Prophylaxis: Subcutaneous heparin  Code Status: Full code Family Communication: Discussed with the patient  Disposition Plan: SNF    Medications: Scheduled:  sodium chloride    Intravenous Once   Chlorhexidine  Gluconate Cloth  6 each Topical Q0600   darbepoetin (ARANESP ) injection - NON-DIALYSIS  100 mcg Subcutaneous Q Sat-1800   furosemide   80 mg Oral BID   heparin  injection (subcutaneous)  5,000 Units Subcutaneous Q8H   insulin  aspart  0-9 Units Subcutaneous TID WC   pantoprazole   40 mg Oral BID   tamsulosin   0.4 mg Oral QPC supper   Continuous:   PRN:acetaminophen  **OR** acetaminophen , artificial tears, morphine  injection, ondansetron  **OR** ondansetron  (ZOFRAN ) IV, mouth rinse, tiZANidine , traZODone    Objective:  Vital Signs  Vitals:   10/11/23 0342 10/11/23 0426 10/11/23 0840 10/11/23 1200  BP: (!) 98/55  107/68   Pulse: 64  70   Resp: 17     Temp: 98.3 F (36.8 C)  98.3 F (36.8 C)   TempSrc: Oral  Oral   SpO2: 99%  97%   Weight:  102.9 kg  102.7 kg  Height:        Intake/Output Summary (Last 24 hours) at 10/11/2023 1517 Last  data filed at 10/11/2023 0900 Gross per 24 hour  Intake 600 ml  Output 0 ml  Net 600 ml   Filed Weights   10/10/23 1049 10/11/23 0426 10/11/23 1200  Weight: 102.7 kg 102.9 kg 102.7 kg    Physical Exam  Constitutional: In no distress.  Cardiovascular: Normal rate, regular rhythm. 2+ bilateral lower extremity edema up to hips  Pulmonary: Non labored breathing on room air, no wheezing or rales.   Abdominal: Soft. Normal bowel sounds. Non distended and non tender      Neurological: Alert and oriented to person, place, and time. Non focal  Skin: Skin is warm and dry.   Lab Results:  Data Reviewed: I have personally  reviewed following labs and reports of the imaging studies  CBC: Recent Labs  Lab 10/05/23 0432 10/06/23 0507 10/07/23 0445 10/09/23 1629 10/10/23 0830 10/11/23 0712  WBC 11.9* 13.0* 13.7*  --  10.7* 10.7*  HGB 8.1* 8.2* 8.1* 10.2* 8.6* 8.5*  HCT 26.3* 25.8* 26.0* 30.0* 28.2* 28.0*  MCV 86.5 86.6 88.7  --  89.2 90.0  PLT 245 252 218  --  222 209    Basic Metabolic Panel: Recent Labs  Lab 10/05/23 0432 10/06/23 0507 10/07/23 0445 10/08/23 0518 10/09/23 1629 10/10/23 0830  NA 137 139 139 140 140 137  K 3.1* 3.4* 3.4* 3.4* 4.3 3.7  CL 104 106 104 103 98 98  CO2 21* 22 23 25   --  25  GLUCOSE 99 136* 119* 123* 105* 215*  BUN 89* 95* 96* 93* 105* 90*  CREATININE 4.78* 4.94* 4.94* 4.98* 4.80* 4.74*  CALCIUM  8.0* 8.4* 8.6* 8.8*  --  8.8*  MG 1.9 1.8 1.8 1.7  --  1.8  PHOS  --   --   --   --   --  6.4*    GFR: Estimated Creatinine Clearance: 17.4 mL/min (A) (by C-G formula based on SCr of 4.74 mg/dL (H)).  Liver Function Tests: Recent Labs  Lab 10/10/23 0830  ALBUMIN  2.5*     Coagulation Profile: No results for input(s): "INR", "PROTIME" in the last 168 hours.    CBG: Recent Labs  Lab 10/10/23 1135 10/10/23 1642 10/10/23 2135 10/11/23 0820 10/11/23 1205  GLUCAP 149* 237* 184* 120* 158*    Radiology Studies: DG Chest Port 1 View Result Date: 10/09/2023 CLINICAL DATA:  Status post dialysis catheter insertion EXAM: PORTABLE CHEST 1 VIEW COMPARISON:  10/01/2023 FINDINGS: Stable cardiomegaly. Perihilar predominant airspace and interstitial opacities suggestive of edema similar to prior. Small left pleural effusion. No pneumothorax. Interval placement of a right IJ dialysis catheter with tip at the superior cavoatrial junction. IMPRESSION: 1. Right IJ dialysis catheter tip at the superior cavoatrial junction. No pneumothorax. 2. Similar pulmonary edema and small left pleural effusion. Electronically Signed   By: Rozell Cornet M.D.   On: 10/09/2023 19:08   DG  C-Arm 1-60 Min Result Date: 10/09/2023 CLINICAL DATA:  Insertion of dialysis catheter using palindrome catheter kit EXAM: DG C-ARM 1-60 MIN FLUOROSCOPY: Fluoroscopy Time:  26.6 seconds Radiation Exposure Index (if provided by the fluoroscopic device): 7.4 mGy Number of Acquired Spot Images: 1 COMPARISON:  None Available. FINDINGS: Single intra procedural fluoroscopic image during insertion of a dialysis catheter. The catheter tip projects at the level of the low IVC. IMPRESSION: Single intra procedural fluoroscopic image during insertion of a dialysis catheter. The catheter tip projects at the level of the low IVC. Electronically Signed   By: Christell Cove.D.  On: 10/09/2023 19:05   VAS US  UPPER EXT VEIN MAPPING (PRE-OP  AVF) Result Date: 10/09/2023 UPPER EXTREMITY VEIN MAPPING Patient Name:  BENI TURRELL  Date of Exam:   10/09/2023 Medical Rec #: [id number]    Accession #:    [id number] Date of Birth: 1950-05-31   Patient Gender: M Patient Age:   37 years Exam Location:  [facility] Procedure:      VAS US  UPPER EXT VEIN MAPPING (PRE-OP  AVF) Referring Phys: Adrian Alba --------------------------------------------------------------------------------  Indications: Pre-access. History: ESRD - need for permanant access.  Limitations: subcutaneous edema (LUE) Comparison Study: No previous exams Performing Technologist: Jody Hill RVT, RDMS  Examination Guidelines: A complete evaluation includes B-mode imaging, spectral Doppler, color Doppler, and power Doppler as needed of all accessible portions of each vessel. Bilateral testing is considered an integral part of a complete examination. Limited examinations for reoccurring indications may be performed as noted. +-----------------+-------------+----------+---------+ Right Cephalic   Diameter (cm)Depth (cm)Findings  +-----------------+-------------+----------+---------+ Shoulder             0.28        1.19              +-----------------+-------------+----------+---------+ Prox upper arm       0.35        1.06             +-----------------+-------------+----------+---------+ Mid upper arm        0.17        0.52   branching +-----------------+-------------+----------+---------+ Dist upper arm       0.15        0.38             +-----------------+-------------+----------+---------+ Antecubital fossa    0.30        0.36             +-----------------+-------------+----------+---------+ Prox forearm         0.17        0.65   branching +-----------------+-------------+----------+---------+ Mid forearm          0.17        0.62             +-----------------+-------------+----------+---------+ Dist forearm         0.12        0.34             +-----------------+-------------+----------+---------+ Wrist                0.17        0.33             +-----------------+-------------+----------+---------+ +-----------------+-------------+----------+---------+ Right Basilic    Diameter (cm)Depth (cm)Findings  +-----------------+-------------+----------+---------+ Prox upper arm       0.65        1.42             +-----------------+-------------+----------+---------+ Mid upper arm        0.36        1.57   branching +-----------------+-------------+----------+---------+ Dist upper arm       0.33        1.20   branching +-----------------+-------------+----------+---------+ Antecubital fossa    0.17        1.00             +-----------------+-------------+----------+---------+ Prox forearm         0.15        0.50             +-----------------+-------------+----------+---------+ Mid forearm          0.13  0.39             +-----------------+-------------+----------+---------+ Distal forearm       0.14        0.26             +-----------------+-------------+----------+---------+ Wrist                0.12        0.22              +-----------------+-------------+----------+---------+ +-----------------+-------------+----------+----------------------------+ Left Cephalic    Diameter (cm)Depth (cm)          Findings           +-----------------+-------------+----------+----------------------------+ Shoulder             0.22        1.30                                +-----------------+-------------+----------+----------------------------+ Prox upper arm       0.19        0.94                                +-----------------+-------------+----------+----------------------------+ Mid upper arm        0.20        0.90            branching           +-----------------+-------------+----------+----------------------------+ Dist upper arm       0.20        0.73                                +-----------------+-------------+----------+----------------------------+ Antecubital fossa    0.50        0.28   branching and acute thrombus +-----------------+-------------+----------+----------------------------+ Prox forearm         0.22        0.86                                +-----------------+-------------+----------+----------------------------+ Mid forearm          0.19        0.76                                +-----------------+-------------+----------+----------------------------+ Dist forearm         0.29        0.43          acute thrombus        +-----------------+-------------+----------+----------------------------+ Wrist                0.13        0.36                                +-----------------+-------------+----------+----------------------------+ +-----------------+-------------+----------+--------------+ Left Basilic     Diameter (cm)Depth (cm)   Findings    +-----------------+-------------+----------+--------------+ Prox upper arm       0.42        1.25                  +-----------------+-------------+----------+--------------+ Mid upper arm        0.37        1.53  branching    +-----------------+-------------+----------+--------------+ Dist upper arm       0.31        1.04     branching    +-----------------+-------------+----------+--------------+ Antecubital fossa    0.25        1.01                  +-----------------+-------------+----------+--------------+ Prox forearm         0.26        1.20                  +-----------------+-------------+----------+--------------+ Mid forearm                             not visualized +-----------------+-------------+----------+--------------+ Distal forearm       0.17        0.74                  +-----------------+-------------+----------+--------------+ Wrist                0.12        0.43                  +-----------------+-------------+----------+--------------+ IV placement to LUE cephalic vein distal forearm. Subcutaneous edema of LUE (AC and forearm). Summary:  Left: Incidental finding of acute cephalic SVT. *See table(s) above for measurements and observations.  Diagnosing physician: Delaney Fearing Electronically signed by Delaney Fearing on 10/09/2023 at 6:46:41 PM.    Final        LOS: 10 days   Joette Mustard  [facility] Pager on [email or web address]  10/11/2023, 3:17 PM

## 2023-10-11 NOTE — Plan of Care (Signed)
 Patient doing well today.  Vitals stable and will dialyze tomorrow.

## 2023-10-11 NOTE — Anesthesia Postprocedure Evaluation (Signed)
 Anesthesia Post Note  Patient: Aurel Nguyen  Procedure(s) Performed: ARTERIOVENOUS (AV) FISTULA CREATION, FIRST STAGE BASILIC (Left: Arm Lower) INSERTION OF DIALYSIS CATHETER, USING PALINDROME CATHETER KIT 19CM (Right: Neck)     Patient location during evaluation: PACU Anesthesia Type: General Level of consciousness: awake and alert Pain management: pain level controlled Vital Signs Assessment: post-procedure vital signs reviewed and stable Respiratory status: spontaneous breathing, nonlabored ventilation, respiratory function stable and patient connected to nasal cannula oxygen Cardiovascular status: blood pressure returned to baseline and stable Postop Assessment: no apparent nausea or vomiting Anesthetic complications: no   No notable events documented.  Last Vitals:  Vitals:   10/10/23 1935 10/11/23 0342  BP: (!) 93/56 (!) 98/55  Pulse: 74 64  Resp: 16 17  Temp: 36.5 C 36.8 C  SpO2: 99% 99%    Last Pain:  Vitals:   10/11/23 0542  TempSrc:   PainSc: Asleep                 Yicel Shannon S

## 2023-10-12 ENCOUNTER — Ambulatory Visit: Attending: Internal Medicine | Admitting: Cardiology

## 2023-10-12 ENCOUNTER — Encounter (HOSPITAL_COMMUNITY): Payer: Self-pay | Admitting: Vascular Surgery

## 2023-10-12 DIAGNOSIS — N185 Chronic kidney disease, stage 5: Secondary | ICD-10-CM | POA: Diagnosis not present

## 2023-10-12 DIAGNOSIS — R601 Generalized edema: Secondary | ICD-10-CM | POA: Diagnosis not present

## 2023-10-12 LAB — CBC
HCT: 28.9 % — ABNORMAL LOW (ref 39.0–52.0)
Hemoglobin: 8.8 g/dL — ABNORMAL LOW (ref 13.0–17.0)
MCH: 27.5 pg (ref 26.0–34.0)
MCHC: 30.4 g/dL (ref 30.0–36.0)
MCV: 90.3 fL (ref 80.0–100.0)
Platelets: 215 10*3/uL (ref 150–400)
RBC: 3.2 MIL/uL — ABNORMAL LOW (ref 4.22–5.81)
RDW: 17.2 % — ABNORMAL HIGH (ref 11.5–15.5)
WBC: 11.4 10*3/uL — ABNORMAL HIGH (ref 4.0–10.5)
nRBC: 0 % (ref 0.0–0.2)

## 2023-10-12 LAB — GLUCOSE, CAPILLARY
Glucose-Capillary: 110 mg/dL — ABNORMAL HIGH (ref 70–99)
Glucose-Capillary: 169 mg/dL — ABNORMAL HIGH (ref 70–99)
Glucose-Capillary: 217 mg/dL — ABNORMAL HIGH (ref 70–99)

## 2023-10-12 MED ORDER — OXYCODONE HCL 5 MG PO TABS: 5.0000 mg | ORAL_TABLET | Freq: Four times a day (QID) | ORAL | Status: DC | PRN

## 2023-10-12 MED ORDER — HEPARIN SODIUM (PORCINE) 1000 UNIT/ML IJ SOLN
INTRAMUSCULAR | Status: AC
  Filled 2023-10-12: qty 2

## 2023-10-12 MED ORDER — HEPARIN SODIUM (PORCINE) 1000 UNIT/ML IJ SOLN
INTRAMUSCULAR | Status: AC
Start: 2023-10-12 — End: ?
  Filled 2023-10-12: qty 5

## 2023-10-12 MED ORDER — HEPARIN SODIUM (PORCINE) 1000 UNIT/ML DIALYSIS
40.0000 [IU]/kg | INTRAMUSCULAR | Status: DC | PRN
  Administered 2023-10-12: 4100 [IU] via INTRAVENOUS_CENTRAL
  Filled 2023-10-12 (×2): qty 5

## 2023-10-12 NOTE — Procedures (Signed)
HD Note:  Some information was entered later than the data was gathered due to patient care needs. The stated time with the data is accurate.  Received patient in bed to unit.   Alert and oriented.   Informed consent signed and in chart.   Access used: Upper right chest HD catheter Access issues: None  Patient tolerated treatment well.   TX duration: 3 hours  Alert, without acute distress.  Total UF removed: 1500 ml  Hand-off given to patient's nurse.   Transported back to the room   Keyonta Madrid L. Dareen Piano, RN Kidney Dialysis Unit.

## 2023-10-12 NOTE — Progress Notes (Signed)
 Patient ID: Calvin Schultz, male   DOB: 1951/03/25, 73 y.o.   MRN: [id number] [facility] Progress Note   Assessment/ Plan:    1.  Anasarca/volume overload:  - Secondary to acute exacerbation of diastolic heart failure with limited success/response to diuretics in the setting of declining renal function prompting need to initiate chronic hemodialysis.  Continued furosemide  80 mg twice a day to help augment urine output. Can transition to lasix  on non-HD days on discharge   2. ESRD: With acute kidney injury on chronic kidney disease stage V and recurrent volume overload.  Now progressed to ESRD.  Started on hemodialysis (first treatment 10/10/2023) after placement of RIJ TDC and LUA AVF (1st stage BBF) on 10/09/2023 by Dr. Susi Eric.   - HD today and per MWF schedule for now  - I have spoken with HD SW - they initiated CLIP last week and they will follow-up progress in getting an outpatient HD unit for him  - please choose an alternative to morphine  for pain given ESRD (fentanyl  or dilaudid  acceptable if an IV agent is needed) - await labs from today  3. Anemia of CKD: Secondary to chronic illness including chronic kidney disease,  - on aranesp  100 mcg every Saturday   - Iron  stores adequate.  4. CKD-MBD: Calcium  level acceptable with elevated phosphorus level-we will continue to trend this with hemodialysis to decide on need for adding binder. - await labs  - renal panel in AM   5. Nutrition: change to renal diabetic diet with fluid restriction; now on dialysis.  6. Hypertension: controlled; avoid hypotension   Disposition - continue inpatient monitoring; awaiting outpatient HD unit     Subjective:    He had 650 mL UOP over 6/8 charted.  Seen and examined on dialysis.  Blood pressure 118/63 and HR 69.  Tolerating goal.  RIJ tunneled catheter in use.   He feels ok.  He states that he lives in [facility] and would prefer HD in [place] over [place] so that transportation would be  easier on his wife.   States that he hasn't been seen in the office by [facility] previously.   Review of systems:  Denies shortness of breath or chest pain  Denies n/v     Objective:   BP (!) 100/51   Pulse 66   Temp 97.6 F (36.4 C) (Oral)   Resp 14   Ht 5\' 10"  (1.778 m)   Wt 102.7 kg   SpO2 99%   BMI 32.49 kg/m   Physical Exam: General adult male in bed in no acute distress HEENT normocephalic atraumatic extraocular movements intact sclera anicteric Neck supple trachea midline Lungs clear to auscultation bilaterally normal work of breathing at rest on room air  Heart S1S2 no rub Abdomen soft nontender nondistended Extremities 2+ edema lower extremities Psych normal mood and affect Access RIJ tunneled catheter in use and Access: LUE AVF with bruit and thrill    Labs: BMET Recent Labs  Lab 10/06/23 0507 10/07/23 0445 10/08/23 0518 10/09/23 1629 10/10/23 0830  NA 139 139 140 140 137  K 3.4* 3.4* 3.4* 4.3 3.7  CL 106 104 103 98 98  CO2 22 23 25   --  25  GLUCOSE 136* 119* 123* 105* 215*  BUN 95* 96* 93* 105* 90*  CREATININE 4.94* 4.94* 4.98* 4.80* 4.74*  CALCIUM  8.4* 8.6* 8.8*  --  8.8*  PHOS  --   --   --   --  6.4*   CBC  Recent Labs  Lab 10/07/23 0445 10/09/23 1629 10/10/23 0830 10/11/23 0712 10/12/23 0547  WBC 13.7*  --  10.7* 10.7* 11.4*  HGB 8.1* 10.2* 8.6* 8.5* 8.8*  HCT 26.0* 30.0* 28.2* 28.0* 28.9*  MCV 88.7  --  89.2 90.0 90.3  PLT 218  --  222 209 215      Medications:     sodium chloride    Intravenous Once   Chlorhexidine  Gluconate Cloth  6 each Topical Q0600   darbepoetin (ARANESP ) injection - NON-DIALYSIS  100 mcg Subcutaneous Q Sat-1800   furosemide   80 mg Oral BID   heparin  injection (subcutaneous)  5,000 Units Subcutaneous Q8H   insulin  aspart  0-9 Units Subcutaneous TID WC   pantoprazole   40 mg Oral BID   tamsulosin   0.4 mg Oral QPC supper   Nan Aver, MD 10/12/2023, 12:48 PM

## 2023-10-12 NOTE — Plan of Care (Signed)
 Patient went to dialysis today.  Vitals remain stable and doing well. PT to work with him tomorrow because he was in dialysis for a long time today.

## 2023-10-12 NOTE — Progress Notes (Signed)
 PT Cancellation Note  Patient Details Name: Calvin Schultz MRN: [id number] DOB: [birth year]   Cancelled Treatment:    Reason Eval/Treat Not Completed: Patient at procedure or test/unavailable (HD). Will check back later in day as time allows.   Amey Ka, PT  Acute Rehab Services Secure chat preferred Office [phone]    Sharman Debar 10/12/2023, 10:57 AM

## 2023-10-12 NOTE — Progress Notes (Signed)
 Received a call from [facility] who received referral from [facility] admissions. Contacted CSW to inquire if snf could/would transport pt to [facility]. CSW states snf can transport to [facility] and prefers a MWF if possible. Contacted [facility] clinic manager to be advised that snf can transport pt to clinic and snf is requesting a MWF if possible. Will await response/determination from local clinic. Will assist as needed.   Lauraine Polite Renal Navigator [phone]

## 2023-10-12 NOTE — Progress Notes (Signed)
 Pt just had 25 beats of V-tach per tele. Pt is sleeping at this time. RN messaged MD on call and received orders for STAT BMET and Mag.   Calvin Schultz Hallis Meditz

## 2023-10-12 NOTE — Progress Notes (Addendum)
 [facility] PROGRESS NOTE   Calvin Schultz WUJ:[id number] DOB: February 17, 1951 DOA: 10/01/2023  PCP: Audria Leather, MD  Brief History: PMH of CKD 5, chronic A-fib, T2DM, HLD, HTN, BPH presented to the hospital with complaints of worsening swelling of his legs. Was recently hospitalized for septic shock secondary to C. difficile colitis.  Lasix  was discontinued.  Presented to the hospital now with worsening swelling for last 6 weeks progressively worsening. Nephrology consulted.  Consultants: Nephrology.  Vascular surgery.  Procedures:   Intraoperative left arm vein mapping left arm 1st stage brachiobasilic AVF creation Right internal jugular vein tunneled dialysis catheter placement under ultrasound and fluoroscopic guidance    Subjective/Interval History: Patient feels well.  No complaints offered.  No shortness of breath nausea or vomiting.  Having regular bowel movements.    Assessment/Plan:  CKD stage V has now progressed to end-stage renal disease Acute on chronic diastolic CHF Anasarca. Hypoalbuminemia with third spacing. Metabolic acidosis Baseline serum creatinine appears to be around 5.6.  On admission serum creatinine was 4.5 Patient was noted to be edematous.  Nephrology was consulted.  Patient started on IV diuretics.  Given IV albumin  as well. Patient's volume status however did not improve much.  Patient subsequently started on hemodialysis.  Now considered end-stage renal disease. Has been dialyzed in the hospital.  To go for hemodialysis again today. Outpatient dialysis to be arranged prior to discharge.   Anemia of chronic kidney disease with acute worsening. Baseline hemoglobin 8.6.  On admission hemoglobin was 6.0.  Likely dilutional in nature due to volume overload.  Patient was transfused 2 units of PRBC.   Hemoglobin has responded and noted to be stable for the most part.  No active bleeding noted.    Positive Hemoccult Noted to have positive  Hemoccult on 5/29 though stool was brown.  No overt bleeding noted.  Continue to monitor for now. Outpatient management.   Bilateral heel ulcer Outpatient podiatry follow-up recommended, referral placed. Appreciate wound care in the hospital.    Essential hypertension Blood pressure is reasonably well-controlled.  Amlodipine  on hold.   BPH. Flomax .    Chronic muscle spasm. On Zanaflex .  Needed.  Obesity Estimated body mass index is 32.49 kg/m as calculated from the following:   Height as of this encounter: 5\' 10"  (1.778 m).   Weight as of this encounter: 102.7 kg.   DVT Prophylaxis: Subcutaneous heparin  Code Status: Full code Family Communication: Discussed with the patient Disposition Plan: SNF once outpatient dialysis has been arranged    Medications: Scheduled:  sodium chloride    Intravenous Once   Chlorhexidine  Gluconate Cloth  6 each Topical Q0600   darbepoetin (ARANESP ) injection - NON-DIALYSIS  100 mcg Subcutaneous Q Sat-1800   furosemide   80 mg Oral BID   heparin  injection (subcutaneous)  5,000 Units Subcutaneous Q8H   insulin  aspart  0-9 Units Subcutaneous TID WC   pantoprazole   40 mg Oral BID   tamsulosin   0.4 mg Oral QPC supper   Continuous: PRN:acetaminophen  **OR** acetaminophen , artificial tears, bisacodyl, heparin , morphine  injection, ondansetron  **OR** ondansetron  (ZOFRAN ) IV, mouth rinse, tiZANidine , traZODone    Objective:  Vital Signs  Vitals:   10/11/23 1625 10/11/23 1950 10/12/23 0516 10/12/23 0907  BP: 106/65 108/63 103/63 104/69  Pulse: 73 70 (!) 102 68  Resp: 13 17 18 18   Temp: 97.9 F (36.6 C) 97.8 F (36.6 C) 97.6 F (36.4 C)   TempSrc: Oral Oral Oral   SpO2: 99% 99% 95% 98%  Weight:  Height:        Intake/Output Summary (Last 24 hours) at 10/12/2023 1028 Last data filed at 10/12/2023 0900 Gross per 24 hour  Intake 220 ml  Output 650 ml  Net -430 ml   Filed Weights   10/10/23 1049 10/11/23 0426 10/11/23 1200  Weight:  102.7 kg 102.9 kg 102.7 kg    General appearance: Awake alert.  In no distress Resp: Clear to auscultation bilaterally.  Normal effort Cardio: S1-S2 is normal regular.  No S3-S4.  No rubs murmurs or bruit GI: Abdomen is soft.  Nontender nondistended.  Bowel sounds are present normal.  No masses organomegaly Extremities: Edema bilateral lower extremities  Lab Results:  Data Reviewed: I have personally reviewed following labs and reports of the imaging studies  CBC: Recent Labs  Lab 10/06/23 0507 10/07/23 0445 10/09/23 1629 10/10/23 0830 10/11/23 0712 10/12/23 0547  WBC 13.0* 13.7*  --  10.7* 10.7* 11.4*  HGB 8.2* 8.1* 10.2* 8.6* 8.5* 8.8*  HCT 25.8* 26.0* 30.0* 28.2* 28.0* 28.9*  MCV 86.6 88.7  --  89.2 90.0 90.3  PLT 252 218  --  222 209 215    Basic Metabolic Panel: Recent Labs  Lab 10/06/23 0507 10/07/23 0445 10/08/23 0518 10/09/23 1629 10/10/23 0830  NA 139 139 140 140 137  K 3.4* 3.4* 3.4* 4.3 3.7  CL 106 104 103 98 98  CO2 22 23 25   --  25  GLUCOSE 136* 119* 123* 105* 215*  BUN 95* 96* 93* 105* 90*  CREATININE 4.94* 4.94* 4.98* 4.80* 4.74*  CALCIUM  8.4* 8.6* 8.8*  --  8.8*  MG 1.8 1.8 1.7  --  1.8  PHOS  --   --   --   --  6.4*    GFR: Estimated Creatinine Clearance: 17.4 mL/min (A) (by C-G formula based on SCr of 4.74 mg/dL (H)).  Liver Function Tests: Recent Labs  Lab 10/10/23 0830  ALBUMIN  2.5*    CBG: Recent Labs  Lab 10/11/23 0820 10/11/23 1205 10/11/23 1617 10/11/23 2100 10/12/23 0741  GLUCAP 120* 158* 144* 144* 110*    Radiology Studies: No results found.     LOS: 11 days   [name] [employer] on [email or web address]  10/12/2023, 10:28 AM

## 2023-10-13 ENCOUNTER — Inpatient Hospital Stay (HOSPITAL_COMMUNITY)

## 2023-10-13 DIAGNOSIS — I4729 Other ventricular tachycardia: Secondary | ICD-10-CM

## 2023-10-13 DIAGNOSIS — I5033 Acute on chronic diastolic (congestive) heart failure: Secondary | ICD-10-CM

## 2023-10-13 DIAGNOSIS — R601 Generalized edema: Secondary | ICD-10-CM | POA: Diagnosis not present

## 2023-10-13 DIAGNOSIS — N185 Chronic kidney disease, stage 5: Secondary | ICD-10-CM | POA: Diagnosis not present

## 2023-10-13 LAB — BASIC METABOLIC PANEL WITH GFR
Anion gap: 12 (ref 5–15)
BUN: 50 mg/dL — ABNORMAL HIGH (ref 8–23)
CO2: 27 mmol/L (ref 22–32)
Calcium: 8.2 mg/dL — ABNORMAL LOW (ref 8.9–10.3)
Chloride: 98 mmol/L (ref 98–111)
Creatinine, Ser: 3.5 mg/dL — ABNORMAL HIGH (ref 0.61–1.24)
GFR, Estimated: 18 mL/min — ABNORMAL LOW (ref >60)
Glucose, Bld: 127 mg/dL — ABNORMAL HIGH (ref 70–99)
Potassium: 3.6 mmol/L (ref 3.5–5.1)
Sodium: 137 mmol/L (ref 135–145)

## 2023-10-13 LAB — GLUCOSE, CAPILLARY
Glucose-Capillary: 136 mg/dL — ABNORMAL HIGH (ref 70–99)
Glucose-Capillary: 159 mg/dL — ABNORMAL HIGH (ref 70–99)
Glucose-Capillary: 102 mg/dL — ABNORMAL HIGH (ref 70–99)
Glucose-Capillary: 167 mg/dL — ABNORMAL HIGH (ref 70–99)

## 2023-10-13 LAB — ECHOCARDIOGRAM LIMITED
Height: 70 in
S' Lateral: 3.8 cm
Weight: 3675.51 [oz_av]

## 2023-10-13 LAB — RENAL FUNCTION PANEL
Albumin: 2.3 g/dL — ABNORMAL LOW (ref 3.5–5.0)
Anion gap: 11 (ref 5–15)
BUN: 51 mg/dL — ABNORMAL HIGH (ref 8–23)
CO2: 25 mmol/L (ref 22–32)
Calcium: 8.2 mg/dL — ABNORMAL LOW (ref 8.9–10.3)
Chloride: 101 mmol/L (ref 98–111)
Creatinine, Ser: 3.49 mg/dL — ABNORMAL HIGH (ref 0.61–1.24)
GFR, Estimated: 18 mL/min — ABNORMAL LOW (ref >60)
Glucose, Bld: 107 mg/dL — ABNORMAL HIGH (ref 70–99)
Phosphorus: 4.4 mg/dL (ref 2.5–4.6)
Potassium: 3.5 mmol/L (ref 3.5–5.1)
Sodium: 137 mmol/L (ref 135–145)

## 2023-10-13 LAB — MAGNESIUM: Magnesium: 1.7 mg/dL (ref 1.7–2.4)

## 2023-10-13 MED ORDER — METOPROLOL TARTRATE 12.5 MG HALF TABLET
12.5000 mg | ORAL_TABLET | Freq: Two times a day (BID) | ORAL | Status: DC
  Administered 2023-10-13 (×2): 12.5 mg via ORAL
  Filled 2023-10-13 (×2): qty 1

## 2023-10-13 MED ORDER — CHLORHEXIDINE GLUCONATE CLOTH 2 % EX PADS
6.0000 | MEDICATED_PAD | Freq: Every day | CUTANEOUS | Status: DC
Start: 2023-10-14 — End: 2023-10-17
  Administered 2023-10-14 – 2023-10-15 (×2): 6 via TOPICAL

## 2023-10-13 NOTE — Progress Notes (Signed)
 Occupational Therapy Treatment Patient Details Name: Calvin Schultz MRN: [id number] DOB: 06-18-1950 Today's Date: 10/13/2023   History of present illness 73 y.o. male who presents on 10/01/23 with worsening body edema and B foot wounds. PMH:  A-fib, CKD stage V, T2DM, HLD, HTN and BPH   OT comments  Patient received in supine and declined EOB due to fatigue from multiple procedures today but patient willing to get into chair position. Patient placed in chair position with limited HOB elevation and performed grooming and UE HEP with yellow therapy band with limitation on LUE.  Patient will benefit from continued inpatient follow up therapy, <3 hours/day. Acute OT to continue to follow to address established goals to facilitate DC to next venue of care.        If plan is discharge home, recommend the following:  Assist for transportation;Two people to help with bathing/dressing/bathroom;Two people to help with walking and/or transfers   Equipment Recommendations  None recommended by OT    Recommendations for Other Services      Precautions / Restrictions Precautions Precautions: Fall Restrictions Weight Bearing Restrictions Per Provider Order: No       Mobility Bed Mobility Overal bed mobility: Needs Assistance Bed Mobility: Rolling Rolling: Max assist, Used rails         General bed mobility comments: performed rolling for positioning in bed    Transfers Overall transfer level: Needs assistance                 General transfer comment: patient declined     Balance Overall balance assessment: Needs assistance     Sitting balance - Comments: patient declined EOB       Standing balance comment: unable                           ADL either performed or assessed with clinical judgement   ADL Overall ADL's : Needs assistance/impaired     Grooming: Wash/dry hands;Wash/dry face;Oral care;Set up;Bed level                                  General ADL Comments: declined further self care tasks other than grooming    Extremity/Trunk Assessment              Vision       Perception     Praxis     Communication Communication Communication: No apparent difficulties   Cognition Arousal: Alert Behavior During Therapy: WFL for tasks assessed/performed Cognition: No apparent impairments                               Following commands: Intact        Cueing   Cueing Techniques: Verbal cues  Exercises Exercises: General Upper Extremity General Exercises - Upper Extremity Shoulder Flexion: AROM, Both, 10 reps (chair position in bed) Shoulder Horizontal ABduction: Strengthening, Both, 10 reps, Theraband (chair position in bed) Theraband Level (Shoulder Horizontal Abduction): Level 1 (Yellow) Elbow Flexion: Strengthening, Both, 5 reps, 10 reps, Theraband (chair position in bed) Theraband Level (Elbow Flexion): Level 1 (Yellow) Elbow Extension: Strengthening, Both, 5 reps, 10 reps, Theraband (chair position in bed) Theraband Level (Elbow Extension): Level 1 (Yellow)    Shoulder Instructions       General Comments Patient stating fatigue from multipe procedures today and asked for bed  level OT    Pertinent Vitals/ Pain       Pain Assessment Pain Assessment: Faces Faces Pain Scale: Hurts a little bit Pain Location: generalized Pain Descriptors / Indicators: Discomfort, Grimacing Pain Intervention(s): Monitored during session  Home Living                                          Prior Functioning/Environment              Frequency  Min 2X/week        Progress Toward Goals  OT Goals(current goals can now be found in the care plan section)  Progress towards OT goals: Progressing toward goals  Acute Rehab OT Goals Patient Stated Goal: to rest OT Goal Formulation: With patient Time For Goal Achievement: 10/16/23 Potential to Achieve Goals: Fair ADL Goals Pt  Will Perform Upper Body Bathing: with min assist;sitting Pt Will Perform Upper Body Dressing: with min assist;sitting Pt/caregiver will Perform Home Exercise Program: Increased strength;Both right and left upper extremity;With Supervision Additional ADL Goal #1: Patient will be Mod A of 1 to roll side to side for increased independence with toileting and decreased CG burden.  Plan      Co-evaluation                 AM-PAC OT "6 Clicks" Daily Activity     Outcome Measure   Help from another person eating meals?: None Help from another person taking care of personal grooming?: None Help from another person toileting, which includes using toliet, bedpan, or urinal?: Total Help from another person bathing (including washing, rinsing, drying)?: A Lot Help from another person to put on and taking off regular upper body clothing?: A Lot Help from another person to put on and taking off regular lower body clothing?: Total 6 Click Score: 14    End of Session    OT Visit Diagnosis: Unsteadiness on feet (R26.81);Muscle weakness (generalized) (M62.81);Pain Pain - Right/Left: Left Pain - part of body: Shoulder (generalized)   Activity Tolerance Patient tolerated treatment well   Patient Left in bed;with call bell/phone within reach;with bed alarm set   Nurse Communication Mobility status        Time: 8657-8469 OT Time Calculation (min): 21 min  Charges: OT General Charges $OT Visit: 1 Visit OT Treatments $Therapeutic Exercise: 8-22 mins  Anitra Barn, OTA Acute Rehabilitation Services  Office [phone]   Jovita Nipper 10/13/2023, 1:52 PM

## 2023-10-13 NOTE — Plan of Care (Signed)
  Problem: Fluid Volume: Goal: Ability to maintain a balanced intake and output will improve Outcome: Completed/Met

## 2023-10-13 NOTE — Progress Notes (Addendum)
 [facility] PROGRESS NOTE   Calvin Schultz RUE:[id number] DOB: 05/13/1950 DOA: 10/01/2023  PCP: Audria Leather, MD  Brief History: PMH of CKD 5, chronic A-fib, T2DM, HLD, HTN, BPH presented to the hospital with complaints of worsening swelling of his legs. Was recently hospitalized for septic shock secondary to C. difficile colitis.  Lasix  was discontinued.  Presented to the hospital now with worsening swelling for last 6 weeks progressively worsening. Nephrology consulted.  Consultants: Nephrology.  Vascular surgery.  Procedures:   Intraoperative left arm vein mapping left arm 1st stage brachiobasilic AVF creation Right internal jugular vein tunneled dialysis catheter placement under ultrasound and fluoroscopic guidance    Subjective/Interval History: Patient feels well.  Denies any complaints.  Did not have any chest pain shortness of breath overnight.    Assessment/Plan:  CKD stage V has now progressed to end-stage renal disease Acute on chronic diastolic CHF Anasarca. Hypoalbuminemia with third spacing. Metabolic acidosis Baseline serum creatinine appears to be around 5.6.  On admission serum creatinine was 4.5 Patient was noted to be edematous.  Nephrology was consulted.  Patient started on IV diuretics.  Given IV albumin  as well. Patient's volume status however did not improve much.  Patient subsequently started on hemodialysis.  Now considered end-stage renal disease. Has been dialyzed in the hospital.   Outpatient dialysis to be arranged prior to discharge.   Anemia of chronic kidney disease with acute worsening. Baseline hemoglobin 8.6.  On admission hemoglobin was 6.0.  Likely dilutional in nature due to volume overload.  Patient was transfused 2 units of PRBC.   Hemoglobin has responded and noted to be stable for the most part.  No active bleeding noted.    NSVT Nonsustained ventricular tachycardia noted on telemetry.  Patient was asymptomatic.   Electrolytes were checked overnight. Telemetry shows PVCs. Chest x-ray without any acute findings.  Will do an EKG.  Limited echocardiogram.   Started on low-dose metoprolol .  Borderline blood pressures noted.  Discussed with nephrology, will discontinue his furosemide .   Positive Hemoccult Noted to have positive Hemoccult on 5/29 though stool was brown.  No overt bleeding noted.  Continue to monitor for now. Outpatient management.   Bilateral heel ulcer Outpatient podiatry follow-up recommended, referral placed. Appreciate wound care in the hospital.    Essential hypertension Amlodipine  on hold.  Furosemide  to be discontinued.  Adding metoprolol  for NSVT as discussed above.   BPH. Flomax .    Chronic muscle spasm. On Zanaflex .  Needed.  Chronic Afib Known history. Not noted to be on anti-coagulation. It appears Eliquis  was stopped in April due to ABLA from hematuria. Needs to f/u with cardiology. Watchman device was being considered.  Obesity Estimated body mass index is 32.96 kg/m as calculated from the following:   Height as of this encounter: 5\' 10"  (1.778 m).   Weight as of this encounter: 104.2 kg.   DVT Prophylaxis: Subcutaneous heparin  Code Status: Full code Family Communication: Discussed with the patient Disposition Plan: SNF once outpatient dialysis has been arranged    Medications: Scheduled:  sodium chloride    Intravenous Once   Chlorhexidine  Gluconate Cloth  6 each Topical Q0600   darbepoetin (ARANESP ) injection - NON-DIALYSIS  100 mcg Subcutaneous Q Sat-1800   furosemide   80 mg Oral BID   heparin  injection (subcutaneous)  5,000 Units Subcutaneous Q8H   insulin  aspart  0-9 Units Subcutaneous TID WC   pantoprazole   40 mg Oral BID   tamsulosin   0.4 mg Oral QPC supper  Continuous: PRN:acetaminophen  **OR** acetaminophen , artificial tears, bisacodyl, ondansetron  **OR** ondansetron  (ZOFRAN ) IV, mouth rinse, oxyCODONE , tiZANidine ,  traZODone    Objective:  Vital Signs  Vitals:   10/12/23 1547 10/12/23 2051 10/13/23 0537 10/13/23 0733  BP: 104/60 114/60 (!) 113/51 111/68  Pulse: 72 69 69 69  Resp: 18 18 18 18   Temp: 98.3 F (36.8 C) 98.7 F (37.1 C) 98.2 F (36.8 C) 98 F (36.7 C)  TempSrc:    Oral  SpO2: 99% 99% 99% 100%  Weight:      Height:        Intake/Output Summary (Last 24 hours) at 10/13/2023 1038 Last data filed at 10/13/2023 0600 Gross per 24 hour  Intake 120 ml  Output 1800 ml  Net -1680 ml   Filed Weights   10/11/23 0426 10/11/23 1200 10/12/23 1416  Weight: 102.9 kg 102.7 kg 104.2 kg    General appearance: Awake alert.  In no distress Resp: Clear to auscultation bilaterally.  Normal effort Cardio: S1-S2 is normal regular.  No S3-S4.  No rubs murmurs or bruit GI: Abdomen is soft.  Nontender nondistended.  Bowel sounds are present normal.  No masses organomegaly Extremities: Edema bilateral lower extremities  Lab Results:  Data Reviewed: I have personally reviewed following labs and reports of the imaging studies  CBC: Recent Labs  Lab 10/07/23 0445 10/09/23 1629 10/10/23 0830 10/11/23 0712 10/12/23 0547  WBC 13.7*  --  10.7* 10.7* 11.4*  HGB 8.1* 10.2* 8.6* 8.5* 8.8*  HCT 26.0* 30.0* 28.2* 28.0* 28.9*  MCV 88.7  --  89.2 90.0 90.3  PLT 218  --  222 209 215    Basic Metabolic Panel: Recent Labs  Lab 10/07/23 0445 10/08/23 0518 10/09/23 1629 10/10/23 0830 10/13/23 0051 10/13/23 0506  NA 139 140 140 137 137 137  K 3.4* 3.4* 4.3 3.7 3.6 3.5  CL 104 103 98 98 98 101  CO2 23 25  --  25 27 25   GLUCOSE 119* 123* 105* 215* 127* 107*  BUN 96* 93* 105* 90* 50* 51*  CREATININE 4.94* 4.98* 4.80* 4.74* 3.50* 3.49*  CALCIUM  8.6* 8.8*  --  8.8* 8.2* 8.2*  MG 1.8 1.7  --  1.8 1.7  --   PHOS  --   --   --  6.4*  --  4.4    GFR: Estimated Creatinine Clearance: 23.8 mL/min (A) (by C-G formula based on SCr of 3.49 mg/dL (H)).  Liver Function Tests: Recent Labs  Lab  10/10/23 0830 10/13/23 0506  ALBUMIN  2.5* 2.3*    CBG: Recent Labs  Lab 10/11/23 2100 10/12/23 0741 10/12/23 1709 10/12/23 2049 10/13/23 0733  GLUCAP 144* 110* 169* 217* 136*    Radiology Studies: DG CHEST PORT 1 VIEW Result Date: 10/13/2023 CLINICAL DATA:  Cardiac palpitations. EXAM: PORTABLE CHEST 1 VIEW COMPARISON:  October 09, 2023. FINDINGS: Stable cardiomegaly. Right internal jugular Port-A-Cath is unchanged. Probable minimal bibasilar subsegmental atelectasis. Possible minimal left pleural effusion. Bony thorax is unremarkable. IMPRESSION: Probable minimal bibasilar subsegmental atelectasis with possible minimal left pleural effusion. Electronically Signed   By: Rosalene Colon M.D.   On: 10/13/2023 10:01       LOS: 12 days   Rashena Dowling  [facility] Pager on [email or web address]  10/13/2023, 10:38 AM

## 2023-10-13 NOTE — TOC Progression Note (Signed)
 Transition of Care [facility]) - Progression Note    Patient Details  Name: Calvin Schultz MRN: [id number] Date of Birth: 12/22/1950  [facility] CM/SW Contact  Vaidehi Braddy A [place], LCSW Phone Number: 10/13/2023, 11:31 AM  Clinical Narrative:     CSW coordinating placemen with renal navigator for clipping to outpt HD clinic.   [facility] can transport pt to [facility], time pending, renal navigator waiting to get final schedule confirmed.   Insurance authorization to be started as pt is near medical stability.    TOC will continue to follow.   Expected Discharge Plan: Skilled Nursing Facility Barriers to Discharge: Continued Medical Work up, English as a second language teacher, SNF Pending bed offer  Expected Discharge Plan and Services       Living arrangements for the past 2 months: Single Family Home                                       Social Determinants of Health (SDOH) Interventions SDOH Screenings   Food Insecurity: No Food Insecurity (10/02/2023)  Housing: Low Risk  (10/02/2023)  Transportation Needs: No Transportation Needs (10/02/2023)  Utilities: Not At Risk (10/02/2023)  Social Connections: Moderately Isolated (10/02/2023)  Tobacco Use: Low Risk  (10/02/2023)    Readmission Risk Interventions    07/10/2023   11:21 AM 06/27/2023    1:00 PM  Readmission Risk Prevention Plan  Transportation Screening Complete Complete  PCP or Specialist Appt within 5-7 Days  Complete  PCP or Specialist Appt within 3-5 Days Complete   Home Care Screening  Complete  Medication Review (RN CM)  Complete  HRI or Home Care Consult Complete   Social Work Consult for Recovery Care Planning/Counseling Complete   Palliative Care Screening Not Applicable   Medication Review Oceanographer) Referral to Pharmacy

## 2023-10-13 NOTE — Plan of Care (Signed)
  Problem: Fluid Volume: Goal: Ability to maintain a balanced intake and output will improve Outcome: Progressing   Problem: Health Behavior/Discharge Planning: Goal: Ability to identify and utilize available resources and services will improve Outcome: Progressing Goal: Ability to manage health-related needs will improve Outcome: Progressing   Problem: Nutritional: Goal: Progress toward achieving an optimal weight will improve Outcome: Progressing   Problem: Skin Integrity: Goal: Risk for impaired skin integrity will decrease Outcome: Progressing   Problem: Tissue Perfusion: Goal: Adequacy of tissue perfusion will improve Outcome: Progressing   Problem: Education: Goal: Knowledge of General Education information will improve Description: Including pain rating scale, medication(s)/side effects and non-pharmacologic comfort measures Outcome: Progressing   Problem: Health Behavior/Discharge Planning: Goal: Ability to manage health-related needs will improve Outcome: Progressing   Problem: Clinical Measurements: Goal: Ability to maintain clinical measurements within normal limits will improve Outcome: Progressing Goal: Will remain free from infection Outcome: Progressing Goal: Diagnostic test results will improve Outcome: Progressing Goal: Respiratory complications will improve Outcome: Progressing Goal: Cardiovascular complication will be avoided Outcome: Progressing   Problem: Activity: Goal: Risk for activity intolerance will decrease Outcome: Progressing   Problem: Nutrition: Goal: Adequate nutrition will be maintained Outcome: Progressing   Problem: Coping: Goal: Level of anxiety will decrease Outcome: Progressing   Problem: Elimination: Goal: Will not experience complications related to bowel motility Outcome: Progressing Goal: Will not experience complications related to urinary retention Outcome: Progressing   Problem: Pain Managment: Goal: General  experience of comfort will improve and/or be controlled Outcome: Progressing   Problem: Safety: Goal: Ability to remain free from injury will improve Outcome: Progressing   Problem: Skin Integrity: Goal: Risk for impaired skin integrity will decrease Outcome: Progressing   Problem: Health Behavior/Discharge Planning: Goal: Ability to manage health-related needs will improve Outcome: Progressing   Problem: Clinical Measurements: Goal: Complications related to the disease process or treatment will be avoided or minimized Outcome: Progressing Goal: Dialysis access will remain free of complications Outcome: Progressing   Problem: Activity: Goal: Activity intolerance will improve Outcome: Progressing   Problem: Fluid Volume: Goal: Fluid volume balance will be maintained or improved Outcome: Progressing   Problem: Nutritional: Goal: Ability to make appropriate dietary choices will improve Outcome: Progressing   Problem: Respiratory: Goal: Respiratory symptoms related to disease process will be avoided Outcome: Progressing   Problem: Self-Concept: Goal: Body image disturbance will be avoided or minimized Outcome: Progressing   Problem: Urinary Elimination: Goal: Progression of disease will be identified and treated Outcome: Progressing

## 2023-10-13 NOTE — Progress Notes (Signed)
 Patient ID: Tery Hoeger, male   DOB: [birth year], 73 y.o.   MRN: [id number] [facility] Progress Note   Assessment/ Plan:    1.  Anasarca/volume overload:  - Secondary to acute exacerbation of diastolic heart failure with limited success/response to diuretics in the setting of declining renal function prompting need to initiate chronic hemodialysis - Lasix  80 mg PO once a day on non-HD days on discharge   2. ESRD: With acute kidney injury on chronic kidney disease stage V and recurrent volume overload.  Now progressed to ESRD.  Started on hemodialysis (first treatment 10/10/2023) after placement of RIJ TDC and LUA AVF (1st stage BBF) on 10/09/2023 by Dr. Susi Eric.   - HD per MWF schedule for now until outpatient plans are finalized.  If needed can go tomorrow and then transition to TTS  - He has been accepted at [facility] on TTS schedule - he will need to follow-up with [facility] in 6 weeks per their note  3. Anemia of CKD: Secondary to chronic illness including chronic kidney disease,  - on aranesp  100 mcg every Saturday while here  - Iron  stores adequate.  4. CKD-MBD: Calcium  level acceptable with elevated phosphorus level-we will continue to trend this with hemodialysis to decide on need for adding binder. - improved for now on current regimen  5. Nutrition: changed to renal diabetic diet with fluid restriction; now on dialysis.  6. Hypertension: controlled; avoid hypotension.  Note metoprolol  was added for PVC's/NSVT per primary team  Disposition - continue inpatient monitoring; awaiting outpatient HD unit (just need to finalize transport to HD)    Subjective:    He had 500 mL UOP over 6/9.  Last HD on 6/9 with 1.5 kg UF.  Lives in [place] and would prefer HD in [place] over [place] so that transportation would be easier on his wife.  Team requested a decrease in his lasix  dose.    Per HD SW he was accepted by [facility] on TTS schedule.  SNF  is now stating they cannot transport so HD SW is working on this (per the outpatient HD unit he could start there Thursday just need to ensure that he can get to HD).  He states that he's had swelling of left arm but this has improved quite a bit    Review of systems:    Denies shortness of breath or chest pain  Denies n/v     Objective:   BP 111/68 (BP Location: Right Arm)   Pulse 69   Temp 98 F (36.7 C) (Oral)   Resp 18   Ht 5\' 10"  (1.778 m)   Wt 104.2 kg   SpO2 100%   BMI 32.96 kg/m   Physical Exam:   General adult male in bed in no acute distress HEENT normocephalic atraumatic extraocular movements intact sclera anicteric Neck supple trachea midline Lungs clear to auscultation bilaterally normal work of breathing at rest on room air  Heart S1S2 no rub Abdomen soft nontender nondistended Extremities 1-2+ edema lower extremities Psych normal mood and affect Access RIJ tunneled catheter in use and Access: LUE AVF with bruit and thrill; some bruising and 1+ edema - pt states both better   Labs: BMET Recent Labs  Lab 10/07/23 0445 10/08/23 0518 10/09/23 1629 10/10/23 0830 10/13/23 0051 10/13/23 0506  NA 139 140 140 137 137 137  K 3.4* 3.4* 4.3 3.7 3.6 3.5  CL 104 103 98 98 98 101  CO2 23 25  --  25 27 25   GLUCOSE 119* 123* 105* 215* 127* 107*  BUN 96* 93* 105* 90* 50* 51*  CREATININE 4.94* 4.98* 4.80* 4.74* 3.50* 3.49*  CALCIUM  8.6* 8.8*  --  8.8* 8.2* 8.2*  PHOS  --   --   --  6.4*  --  4.4   CBC Recent Labs  Lab 10/07/23 0445 10/09/23 1629 10/10/23 0830 10/11/23 0712 10/12/23 0547  WBC 13.7*  --  10.7* 10.7* 11.4*  HGB 8.1* 10.2* 8.6* 8.5* 8.8*  HCT 26.0* 30.0* 28.2* 28.0* 28.9*  MCV 88.7  --  89.2 90.0 90.3  PLT 218  --  222 209 215      Medications:     sodium chloride    Intravenous Once   Chlorhexidine  Gluconate Cloth  6 each Topical Q0600   darbepoetin (ARANESP ) injection - NON-DIALYSIS  100 mcg Subcutaneous Q Sat-1800   heparin  injection  (subcutaneous)  5,000 Units Subcutaneous Q8H   insulin  aspart  0-9 Units Subcutaneous TID WC   metoprolol  tartrate  12.5 mg Oral BID   pantoprazole   40 mg Oral BID   tamsulosin   0.4 mg Oral QPC supper   Nan Aver, MD 10/13/2023, 3:30 PM

## 2023-10-13 NOTE — Progress Notes (Addendum)
 Navigator has been communicating with local clinic regarding pt's referral. Clinic to review info. Navigator has also been communicating with CSW as well regarding whether snf can transport pt to [facility] at d/c. Will follow and assist.   Lauraine Polite Renal Navigator [phone]  Addendum at 12:14 pm: Pt has been accepted at [facility] on [address] on TTS 11:45 am chair time. Pt can start on Thursday and will need to arrive at 11:00 am to complete paperwork prior to treatment. This info provided to CSW to provide to snf to ensure they can accommodate. Update provided to nephrologist as well.

## 2023-10-14 DIAGNOSIS — R601 Generalized edema: Secondary | ICD-10-CM | POA: Diagnosis not present

## 2023-10-14 LAB — RENAL FUNCTION PANEL
Albumin: 2.3 g/dL — ABNORMAL LOW (ref 3.5–5.0)
Anion gap: 12 (ref 5–15)
BUN: 55 mg/dL — ABNORMAL HIGH (ref 8–23)
CO2: 26 mmol/L (ref 22–32)
Calcium: 8.3 mg/dL — ABNORMAL LOW (ref 8.9–10.3)
Chloride: 97 mmol/L — ABNORMAL LOW (ref 98–111)
Creatinine, Ser: 4.02 mg/dL — ABNORMAL HIGH (ref 0.61–1.24)
GFR, Estimated: 15 mL/min — ABNORMAL LOW (ref >60)
Glucose, Bld: 118 mg/dL — ABNORMAL HIGH (ref 70–99)
Phosphorus: 5 mg/dL — ABNORMAL HIGH (ref 2.5–4.6)
Potassium: 3.4 mmol/L — ABNORMAL LOW (ref 3.5–5.1)
Sodium: 135 mmol/L (ref 135–145)

## 2023-10-14 LAB — CBC
HCT: 28.4 % — ABNORMAL LOW (ref 39.0–52.0)
Hemoglobin: 8.4 g/dL — ABNORMAL LOW (ref 13.0–17.0)
MCH: 26.6 pg (ref 26.0–34.0)
MCHC: 29.6 g/dL — ABNORMAL LOW (ref 30.0–36.0)
MCV: 89.9 fL (ref 80.0–100.0)
Platelets: 197 10*3/uL (ref 150–400)
RBC: 3.16 MIL/uL — ABNORMAL LOW (ref 4.22–5.81)
RDW: 17.2 % — ABNORMAL HIGH (ref 11.5–15.5)
WBC: 12.6 10*3/uL — ABNORMAL HIGH (ref 4.0–10.5)
nRBC: 0 % (ref 0.0–0.2)

## 2023-10-14 LAB — GLUCOSE, CAPILLARY
Glucose-Capillary: 98 mg/dL (ref 70–99)
Glucose-Capillary: 262 mg/dL — ABNORMAL HIGH (ref 70–99)
Glucose-Capillary: 170 mg/dL — ABNORMAL HIGH (ref 70–99)

## 2023-10-14 MED ORDER — HEPARIN SODIUM (PORCINE) 1000 UNIT/ML IJ SOLN
INTRAMUSCULAR | Status: AC
  Filled 2023-10-14: qty 4

## 2023-10-14 MED ORDER — ANTICOAGULANT SODIUM CITRATE 4% (200MG/5ML) IV SOLN: 5.0000 mL | Status: DC | PRN

## 2023-10-14 MED ORDER — FUROSEMIDE 40 MG PO TABS
80.0000 mg | ORAL_TABLET | ORAL | Status: DC
  Administered 2023-10-15: 80 mg via ORAL
  Filled 2023-10-14 (×2): qty 2

## 2023-10-14 MED ORDER — LIDOCAINE-PRILOCAINE 2.5-2.5 % EX CREA: 1.0000 | TOPICAL_CREAM | CUTANEOUS | Status: DC | PRN

## 2023-10-14 MED ORDER — LIDOCAINE HCL (PF) 1 % IJ SOLN
5.0000 mL | INTRAMUSCULAR | Status: DC | PRN
Start: 2023-10-14 — End: 2023-10-14

## 2023-10-14 MED ORDER — HEPARIN SODIUM (PORCINE) 1000 UNIT/ML DIALYSIS
1000.0000 [IU] | INTRAMUSCULAR | Status: DC | PRN
Start: 2023-10-14 — End: 2023-10-14

## 2023-10-14 MED ORDER — PENTAFLUOROPROP-TETRAFLUOROETH EX AERO: 1.0000 | INHALATION_SPRAY | CUTANEOUS | Status: DC | PRN

## 2023-10-14 MED ORDER — ALTEPLASE 2 MG IJ SOLR: 2.0000 mg | Freq: Once | INTRAMUSCULAR | Status: DC | PRN

## 2023-10-14 MED ORDER — METOPROLOL SUCCINATE ER 25 MG PO TB24
12.5000 mg | ORAL_TABLET | Freq: Every day | ORAL | Status: DC
  Administered 2023-10-14: 12.5 mg via ORAL
  Filled 2023-10-14 (×2): qty 1
  Filled 2023-10-14: qty 0.5

## 2023-10-14 MED ORDER — HEPARIN SODIUM (PORCINE) 1000 UNIT/ML IJ SOLN
3200.0000 [IU] | Freq: Once | INTRAMUSCULAR | Status: AC
  Administered 2023-10-14: 3200 [IU]

## 2023-10-14 MED ORDER — DARBEPOETIN ALFA 150 MCG/0.3ML IJ SOSY: 150.0000 ug | PREFILLED_SYRINGE | INTRAMUSCULAR | Status: DC

## 2023-10-14 NOTE — Plan of Care (Signed)
  Problem: Health Behavior/Discharge Planning: Goal: Ability to identify and utilize available resources and services will improve Outcome: Progressing Goal: Ability to manage health-related needs will improve Outcome: Progressing   Problem: Nutritional: Goal: Progress toward achieving an optimal weight will improve Outcome: Progressing   Problem: Skin Integrity: Goal: Risk for impaired skin integrity will decrease Outcome: Progressing   Problem: Tissue Perfusion: Goal: Adequacy of tissue perfusion will improve Outcome: Progressing   Problem: Education: Goal: Knowledge of General Education information will improve Description: Including pain rating scale, medication(s)/side effects and non-pharmacologic comfort measures Outcome: Progressing   Problem: Health Behavior/Discharge Planning: Goal: Ability to manage health-related needs will improve Outcome: Progressing   Problem: Clinical Measurements: Goal: Ability to maintain clinical measurements within normal limits will improve Outcome: Progressing Goal: Will remain free from infection Outcome: Progressing Goal: Diagnostic test results will improve Outcome: Progressing Goal: Respiratory complications will improve Outcome: Progressing Goal: Cardiovascular complication will be avoided Outcome: Progressing   Problem: Activity: Goal: Risk for activity intolerance will decrease Outcome: Progressing   Problem: Nutrition: Goal: Adequate nutrition will be maintained Outcome: Progressing   Problem: Coping: Goal: Level of anxiety will decrease Outcome: Progressing   Problem: Elimination: Goal: Will not experience complications related to bowel motility Outcome: Progressing Goal: Will not experience complications related to urinary retention Outcome: Progressing   Problem: Pain Managment: Goal: General experience of comfort will improve and/or be controlled Outcome: Progressing   Problem: Safety: Goal: Ability to  remain free from injury will improve Outcome: Progressing   Problem: Skin Integrity: Goal: Risk for impaired skin integrity will decrease Outcome: Progressing   Problem: Health Behavior/Discharge Planning: Goal: Ability to manage health-related needs will improve Outcome: Progressing   Problem: Clinical Measurements: Goal: Complications related to the disease process or treatment will be avoided or minimized Outcome: Progressing Goal: Dialysis access will remain free of complications Outcome: Progressing   Problem: Activity: Goal: Activity intolerance will improve Outcome: Progressing   Problem: Fluid Volume: Goal: Fluid volume balance will be maintained or improved Outcome: Progressing   Problem: Nutritional: Goal: Ability to make appropriate dietary choices will improve Outcome: Progressing   Problem: Respiratory: Goal: Respiratory symptoms related to disease process will be avoided Outcome: Progressing   Problem: Self-Concept: Goal: Body image disturbance will be avoided or minimized Outcome: Progressing   Problem: Urinary Elimination: Goal: Progression of disease will be identified and treated Outcome: Progressing

## 2023-10-14 NOTE — Progress Notes (Signed)
 Pt's case discussed with [facility] staff this afternoon. Clinic checking to see what pt's schedule would be and to return call to navigator with pt's days/time. Awaiting response from clinic with pt's schedule. Update provided to nephrologist and CSW. Will assist as needed.   Lauraine Polite Renal Navigator [phone]

## 2023-10-14 NOTE — Progress Notes (Signed)
 Received patient in bed to unit.  Alert and oriented.  Informed consent signed and in chart.   TX duration:3.5  Patient tolerated well.  Transported back to the room  Alert, without acute distress.  Hand-off given to patient's nurse.   Access used: R internal jugular HD cath  Access issues: BFR to 350 due to High arterial pressures.  Cartridge wanted to clot off but increasing flushes to every 15 minutes helped  Total UF removed: 2.5L Medication(s) given: none   10/14/23 1204  Vitals  Temp 98.1 F (36.7 C)  BP 117/62  Pulse Rate 65  Resp 12  Oxygen Therapy  SpO2 99 %  O2 Device Room Air  Patient Activity (if Appropriate) In bed  Pulse Oximetry Type Continuous  During Treatment Monitoring  Dialysate Potassium Concentration 3  Dialysate Calcium  Concentration 2.5  Duration of HD Treatment -hour(s) 3.5 hour(s)  HD Safety Checks Performed Yes  Intra-Hemodialysis Comments Tx completed  Dialysis Fluid Bolus Normal Saline  Bolus Amount (mL) 300 mL  Post Treatment  Dialyzer Clearance Clear  Liters Processed 76.3  Fluid Removed (mL) 2500 mL  Tolerated HD Treatment Yes  Hemodialysis Catheter Right Internal jugular Double lumen Permanent (Tunneled)  Placement Date/Time: 10/09/23 1732   Placed prior to admission: No  Serial / Lot #: [id number]  Expiration Date: 07/02/28  Time Out: Correct patient;Correct site;Correct procedure  Maximum sterile barrier precautions: Sterile gloves;Sterile gown;Mask;S...  Site Condition No complications  Blue Lumen Status Flushed;Heparin  locked;Dead end cap in place  Red Lumen Status Flushed;Heparin  locked;Dead end cap in place  Purple Lumen Status N/A  Catheter fill solution Heparin  1000 units/ml  Catheter fill volume (Arterial) 1.6 cc  Catheter fill volume (Venous) 1.6  Dressing Type Transparent  Dressing Status Antimicrobial disc/dressing in place;Clean, Dry, Intact  Drainage Description None  Dressing Change Due 10/16/23  Post treatment  catheter status Capped and Clamped     Luciano Ruths LPN [facility] Dialysis [facility]

## 2023-10-14 NOTE — Progress Notes (Signed)
 [facility] Progress Note Patient: Calvin Schultz WUX:[id number] DOB: Oct 23, 1950 DOA: 10/01/2023  DOS: the patient was seen and examined on 10/14/2023  Brief Hospital Course: PMH of CKD 5, chronic A-fib, T2DM, HLD, HTN, BPH presented to the hospital with complaints of worsening swelling of his legs. Was recently hospitalized for septic shock secondary to C. difficile colitis.  Lasix  was discontinued.  Presented to the hospital now with worsening swelling for last 6 weeks progressively worsening. Nephrology consulted. Started on IV diuresis.  With limited improvement and therefore was started on HD on 6/7.  TDC was placed as well as aVF was placed on 6/6.  Assessment and Plan: CKD stage V.  Now progressing to ESRD. Acute on chronic diastolic CHF Anasarca. Hypoalbuminemia with third spacing. Baseline serum creatinine appears to be around 5.6.  On admission serum creatinine was 4.5 Initiated on IV diuresis with very slow improvement in edema. Nephrology was consulted. Currently on hemodialysis with improvement in swelling. Plan is to continue HD outpatient.  Anemia of chronic kidney disease with acute worsening. Baseline hemoglobin 8.6. On admission hemoglobin was 6.0. Likely dilutional in nature due to volume overload. After 2 PRBC hemoglobin elevated appropriately on remaining stable. Transfuse for hemoglobin less than 8.  Bilateral heel ulcer. Outpatient podiatry follow-up recommended, referral placed. Appreciate wound care in the hospital.   HTN. Continue home blood pressure medication for now but holding Norvasc .  BPH. Flomax .  Will continue.  Chronic muscle spasm. On Zanaflex .  Needed.  Metabolic acidosis.   In setting of CKD.   Subjective: No nausea no vomiting no fever no chills.  Still has some swelling of his legs.  Physical Exam: General: in Mild distress, No Rash Cardiovascular: S1 and S2 Present, No Murmur Respiratory: Good respiratory effort, Bilateral Air  entry present. No Crackles, No wheezes Abdomen: Bowel Sound present, No tenderness Extremities: Bilateral edema Neuro: Alert and oriented x3, no new focal deficit  Data Reviewed: I have Reviewed nursing notes, Vitals, and Lab results. Since last encounter, pertinent lab results CBC and BMP   . I have ordered test including CBC and BMP  .   Disposition: Status is: Inpatient Remains inpatient appropriate because: Monitor for improvement in volume status and management of clip.  heparin  injection 5,000 Units Start: 10/06/23 2200 Place TED hose Start: 10/01/23 1938   Family Communication: No one bedside Level of care: Telemetry Medical   Vitals:   10/14/23 1204 10/14/23 1213 10/14/23 1301 10/14/23 1652  BP: 117/62 (!) 119/54 129/66 117/71  Pulse: 65 64 61 64  Resp: 12 15 18 18   Temp: 98.1 F (36.7 C)  98.3 F (36.8 C) (!) 97.5 F (36.4 C)  TempSrc:   Oral Oral  SpO2: 99% 98% 100% 99%  Weight: 103.8 kg     Height:         Author: Charlean Congress, MD 10/14/2023 6:22 PM  Please look on [email or web address] to find out who is on call.

## 2023-10-14 NOTE — Progress Notes (Signed)
 Patient ID: Calvin Schultz, male   DOB: Apr 05, 1951, 73 y.o.   MRN: [id number] [facility] Progress Note   Assessment/ Plan:    1.  Anasarca/volume overload:  - Secondary to acute exacerbation of diastolic heart failure with limited success/response to diuretics in the setting of declining renal function prompting need to initiate chronic hemodialysis - Lasix  80 mg PO once a day on non-HD days  2. ESRD: With acute kidney injury on chronic kidney disease stage V and recurrent volume overload.  Now progressed to ESRD.  Started on hemodialysis (first treatment 10/10/2023) after placement of RIJ TDC and LUA AVF (1st stage BBF) on 10/09/2023 by Dr. Susi Eric.   - HD per MWF schedule for now until outpatient plans are finalized.   - He was re-routed to another [facility] clinic and we are awaiting schedule; there was an issue with transportation to prior clinic - he will need to follow-up with [facility] in 6 weeks per their note  3. Anemia of CKD: Secondary to chronic illness including chronic kidney disease,  - Increase aranesp  to 150 mcg every Saturday while here  - Iron  stores adequate.  4. CKD-MBD: Calcium  level acceptable with elevated phosphorus level-we will continue to trend this with hemodialysis to decide on need for adding binder. - improved for now on current regimen  5. Nutrition: changed to renal diabetic diet with fluid restriction; now on dialysis.  6. Hypertension: controlled; avoid hypotension.  Note metoprolol  was added for PVC's/NSVT per primary team  Disposition - continue inpatient monitoring; awaiting outpatient HD unit    Subjective:    He had 950 mL UOP over 6/10 as well as one unmeasured urine void.  Per HD SW he was accepted by [facility] on TTS schedule however there was an issue with transportation to HD so was re-routed to another clinic.  Left arm swelling better per pt and I agree  Seen and examined on dialysis.  Procedure supervised.  Blood  pressure 119/61 and HR 65.  RIJ tunn catheter in use.  Tolerating goal.    Review of systems:     Denies shortness of breath or chest pain  Denies n/v     Objective:   BP 119/63   Pulse 65   Temp 98.1 F (36.7 C)   Resp (!) 24   Ht 5' 10 (1.778 m)   Wt 103.4 kg   SpO2 96%   BMI 32.71 kg/m   Physical Exam:    General adult male in bed in no acute distress HEENT normocephalic atraumatic extraocular movements intact sclera anicteric Neck supple trachea midline Lungs clear to auscultation bilaterally normal work of breathing at rest on room air  Heart S1S2 no rub Abdomen soft nontender nondistended Extremities 1-2+ edema lower extremities Psych normal mood and affect Access RIJ tunneled catheter in place and LUE AVF with bruit and thrill; some bruising and trace/1+ edema - pt states both better   Labs: BMET Recent Labs  Lab 10/08/23 0518 10/09/23 1629 10/10/23 0830 10/13/23 0051 10/13/23 0506 10/14/23 0830  NA 140 140 137 137 137 135  K 3.4* 4.3 3.7 3.6 3.5 3.4*  CL 103 98 98 98 101 97*  CO2 25  --  25 27 25 26   GLUCOSE 123* 105* 215* 127* 107* 118*  BUN 93* 105* 90* 50* 51* 55*  CREATININE 4.98* 4.80* 4.74* 3.50* 3.49* 4.02*  CALCIUM  8.8*  --  8.8* 8.2* 8.2* 8.3*  PHOS  --   --  6.4*  --  4.4 5.0*   CBC Recent Labs  Lab 10/10/23 0830 10/11/23 0712 10/12/23 0547 10/14/23 0830  WBC 10.7* 10.7* 11.4* 12.6*  HGB 8.6* 8.5* 8.8* 8.4*  HCT 28.2* 28.0* 28.9* 28.4*  MCV 89.2 90.0 90.3 89.9  PLT 222 209 215 197      Medications:     Chlorhexidine  Gluconate Cloth  6 each Topical Q0600   Chlorhexidine  Gluconate Cloth  6 each Topical Q0600   darbepoetin (ARANESP ) injection - NON-DIALYSIS  100 mcg Subcutaneous Q Sat-1800   heparin  injection (subcutaneous)  5,000 Units Subcutaneous Q8H   heparin  sodium (porcine)  3,200 Units Intracatheter Once   insulin  aspart  0-9 Units Subcutaneous TID WC   metoprolol  succinate  12.5 mg Oral Daily   pantoprazole   40 mg Oral  BID   tamsulosin   0.4 mg Oral QPC supper   Nan Aver, MD 10/14/2023, 10:30 AM

## 2023-10-14 NOTE — TOC Progression Note (Addendum)
 Transition of Care [facility]) - Progression Note    Patient Details  Name: Calvin Schultz MRN: [id number] Date of Birth: March 17, 1951  Transition of Care [facility] CM/SW Contact  Symia Herdt A [place], LCSW Phone Number: 10/14/2023, 12:34 PM  Clinical Narrative:     Update 1621 CSW was updated by Renal navigator that pt accepted to [facility], waiting for schedule confirmation of days/times. CSW notified [facility] of HD clipping and waiting for confirmation of days, times; they are starting pt's authorization for insurance. Status pending. CSW reached out to pt's spouse, Calvin Schultz, provided update on SNF and outpt HD.   1234 CSW reached back out to pt's wife, Calvin Schultz to provide update on HD placement.  CSW informed her that [facility] can accept pt for dialysis, however [facility] can no longer assist with transportation to that facility.   CSW was informed by Tammy at [facility] that they can transport pt if location is changed to [facility].   Alternatively, other facility placement may need to be found if she does not accept placement at [facility]. CSW updated bed offers and she said she would look at other facilities at get back to CSW.   Renal navigator waiting to hear back if [facility] can take pt's referral. Authorization on hold as placement now uncertain, CSW to complete authorization as soon as able.    TOC will continue to follow.   Expected Discharge Plan: Skilled Nursing Facility Barriers to Discharge: Continued Medical Work up, English as a second language teacher, SNF Pending bed offer  Expected Discharge Plan and Services       Living arrangements for the past 2 months: Single Family Home                                       Social Determinants of Health (SDOH) Interventions SDOH Screenings   Food Insecurity: No Food Insecurity (10/02/2023)  Housing: Low Risk  (10/02/2023)  Transportation Needs: No Transportation Needs (10/02/2023)  Utilities: Not At Risk  (10/02/2023)  Social Connections: Moderately Isolated (10/02/2023)  Tobacco Use: Low Risk  (10/02/2023)    Readmission Risk Interventions    07/10/2023   11:21 AM 06/27/2023    1:00 PM  Readmission Risk Prevention Plan  Transportation Screening Complete Complete  PCP or Specialist Appt within 5-7 Days  Complete  PCP or Specialist Appt within 3-5 Days Complete   Home Care Screening  Complete  Medication Review (RN CM)  Complete  HRI or Home Care Consult Complete   Social Work Consult for Recovery Care Planning/Counseling Complete   Palliative Care Screening Not Applicable   Medication Review Oceanographer) Referral to Pharmacy

## 2023-10-14 NOTE — Progress Notes (Signed)
 PT Cancellation Note  Patient Details Name: Calvin Schultz MRN: [id number] DOB: 26-Feb-1951   Cancelled Treatment:    Reason Eval/Treat Not Completed: Fatigue/lethargy limiting ability to participate; reports post dialysis feeling fatigued and needing help for cleaning up due to having BM.  Requesting PT return next day.  Will continue attempts.    Marley Simmers 10/14/2023, 4:19 PM Abigail Hoff, PT Acute Rehabilitation Services Office:[phone] 10/14/2023

## 2023-10-15 DIAGNOSIS — R601 Generalized edema: Secondary | ICD-10-CM | POA: Diagnosis not present

## 2023-10-15 LAB — CBC
HCT: 28.4 % — ABNORMAL LOW (ref 39.0–52.0)
Hemoglobin: 8.4 g/dL — ABNORMAL LOW (ref 13.0–17.0)
MCH: 26.6 pg (ref 26.0–34.0)
MCHC: 29.6 g/dL — ABNORMAL LOW (ref 30.0–36.0)
MCV: 89.9 fL (ref 80.0–100.0)
Platelets: 181 10*3/uL (ref 150–400)
RBC: 3.16 MIL/uL — ABNORMAL LOW (ref 4.22–5.81)
RDW: 17.3 % — ABNORMAL HIGH (ref 11.5–15.5)
WBC: 11.2 10*3/uL — ABNORMAL HIGH (ref 4.0–10.5)
nRBC: 0 % (ref 0.0–0.2)

## 2023-10-15 LAB — RENAL FUNCTION PANEL
Albumin: 2.3 g/dL — ABNORMAL LOW (ref 3.5–5.0)
Anion gap: 9 (ref 5–15)
BUN: 35 mg/dL — ABNORMAL HIGH (ref 8–23)
CO2: 28 mmol/L (ref 22–32)
Calcium: 8.2 mg/dL — ABNORMAL LOW (ref 8.9–10.3)
Chloride: 97 mmol/L — ABNORMAL LOW (ref 98–111)
Creatinine, Ser: 3.28 mg/dL — ABNORMAL HIGH (ref 0.61–1.24)
GFR, Estimated: 19 mL/min — ABNORMAL LOW (ref >60)
Glucose, Bld: 100 mg/dL — ABNORMAL HIGH (ref 70–99)
Phosphorus: 3.9 mg/dL (ref 2.5–4.6)
Potassium: 3.3 mmol/L — ABNORMAL LOW (ref 3.5–5.1)
Sodium: 134 mmol/L — ABNORMAL LOW (ref 135–145)

## 2023-10-15 LAB — GLUCOSE, CAPILLARY
Glucose-Capillary: 113 mg/dL — ABNORMAL HIGH (ref 70–99)
Glucose-Capillary: 199 mg/dL — ABNORMAL HIGH (ref 70–99)
Glucose-Capillary: 113 mg/dL — ABNORMAL HIGH (ref 70–99)
Glucose-Capillary: 210 mg/dL — ABNORMAL HIGH (ref 70–99)

## 2023-10-15 LAB — MAGNESIUM: Magnesium: 1.8 mg/dL (ref 1.7–2.4)

## 2023-10-15 MED ORDER — CHLORHEXIDINE GLUCONATE CLOTH 2 % EX PADS: 6.0000 | MEDICATED_PAD | Freq: Every day | CUTANEOUS | Status: DC

## 2023-10-15 NOTE — Progress Notes (Addendum)
 Contacted [facility] admissions and [facility] to request an update on pt's referral/acceptance and schedule. Awaiting response from [facility] and will provide update to team once info received. Will assist as needed.   Lauraine Polite Renal Navigator [phone]  Addendum at 11:42 am: Pt has been accepted at [facility] on MWF 11:30 am chair time. Pt can start as soon as tomorrow and will need to arrive at 10:00 on first day to complete paperwork prior to treatment. This info provided to attending, nephrologist, and CSW. Contacted renal NP to request that orders be sent to clinic. HD info added to AVS as well. SNF [facility] is pending per CSW.

## 2023-10-15 NOTE — Plan of Care (Signed)
  Problem: Health Behavior/Discharge Planning: Goal: Ability to identify and utilize available resources and services will improve Outcome: Progressing Goal: Ability to manage health-related needs will improve Outcome: Progressing   Problem: Nutritional: Goal: Progress toward achieving an optimal weight will improve Outcome: Progressing   Problem: Skin Integrity: Goal: Risk for impaired skin integrity will decrease Outcome: Progressing   Problem: Tissue Perfusion: Goal: Adequacy of tissue perfusion will improve Outcome: Progressing   Problem: Health Behavior/Discharge Planning: Goal: Ability to manage health-related needs will improve Outcome: Progressing   Problem: Clinical Measurements: Goal: Ability to maintain clinical measurements within normal limits will improve Outcome: Progressing Goal: Will remain free from infection Outcome: Progressing Goal: Diagnostic test results will improve Outcome: Progressing Goal: Respiratory complications will improve Outcome: Progressing Goal: Cardiovascular complication will be avoided Outcome: Progressing   Did sit in chair for about 2 hours. Hoyer lift used. Continue to educate on fluid restriction.

## 2023-10-15 NOTE — TOC Progression Note (Signed)
 Transition of Care [facility]) - Progression Note    Patient Details  Name: Calvin Schultz MRN: [id number] Date of Birth: [birth year]  Transition of Care [facility]) CM/SW Contact  Paullette Boston [place], [place] Phone Number: 10/15/2023, 11:51 AM  Clinical Narrative:  Per Tammy with Cristino Donna, [place] remains pending at this time. Per Renal Navigator, HD has been arranged at [facility] on MWF 11:30am. HD information provided to Tammy. Will provide updates re auth for [facility] as available.   Paullette Boston, MSW, LCSW [phone] (coverage)      Expected Discharge Plan: Skilled Nursing Facility Barriers to Discharge: Continued Medical Work up, English as a second language teacher, SNF Pending bed offer  Expected Discharge Plan and Services       Living arrangements for the past 2 months: Single Family Home                                       Social Determinants of Health (SDOH) Interventions SDOH Screenings   Food Insecurity: No Food Insecurity (10/02/2023)  Housing: Low Risk  (10/02/2023)  Transportation Needs: No Transportation Needs (10/02/2023)  Utilities: Not At Risk (10/02/2023)  Social Connections: Moderately Isolated (10/02/2023)  Tobacco Use: Low Risk  (10/02/2023)    Readmission Risk Interventions    07/10/2023   11:21 AM 06/27/2023    1:00 PM  Readmission Risk Prevention Plan  Transportation Screening Complete Complete  PCP or Specialist Appt within 5-7 Days  Complete  PCP or Specialist Appt within 3-5 Days Complete   Home Care Screening  Complete  Medication Review (RN CM)  Complete  HRI or Home Care Consult Complete   Social Work Consult for Recovery Care Planning/Counseling Complete   Palliative Care Screening Not Applicable   Medication Review Oceanographer) Referral to Pharmacy

## 2023-10-15 NOTE — Progress Notes (Signed)
 Patient ID: Calvin Schultz, male   DOB: 22-Jul-1950, 73 y.o.   MRN: [id number] [facility] Progress Note   Assessment/ Plan:    1.  Anasarca/volume overload:  - Secondary to acute exacerbation of diastolic heart failure with limited success/response to diuretics in the setting of declining renal function prompting need to initiate chronic hemodialysis - Lasix  80 mg PO once a day on non-HD days  2. ESRD: With acute kidney injury on chronic kidney disease stage V and recurrent volume overload.  Now progressed to ESRD.  Started on hemodialysis (first treatment 10/10/2023) after placement of RIJ TDC and LUA AVF (1st stage BBF) on 10/09/2023 by Dr. Susi Eric.   - HD per MWF schedule.  Increase UF goal tomorrow - He has been accepted to [facility] on MWF schedule.  Try 103 kg as EDW.  Would continue challenge this weight as tolerated - he will need to follow-up with [facility] in 6 weeks per their note  3. Anemia of CKD: Secondary to chronic illness including chronic kidney disease,  - Increased aranesp  to 150 mcg every Saturday while here  - Iron  stores adequate.  4. CKD-MBD: Calcium  level acceptable with hyperphos improved for now on current regimen (HD; not on binder)  5. Nutrition: changed to renal diabetic diet with fluid restriction; now on dialysis.  6. Hypertension: controlled; avoid hypotension.  Note metoprolol  was added for PVC's/NSVT per primary team  Disposition - disposition per primary team.  Note if he is discharged today, he can start outpatient tomorrow.  If is to be discharged to SNF tomorrow (6/13) would plan for HD here first shift before discharge.      Subjective:    Strict ins/outs are not available.  He had no urine output charted over 6/11 though previously nonoliguric.  Last HD on 6/11 with 2.5kg UF.  He now has an HD unit - [facility] MWF. He's not sure when he'll be discharged - waiting on an authorization.   Review of systems:     Denies shortness of breath or  chest pain  Denies n/v     Objective:   BP 133/71 (BP Location: Right Arm)   Pulse 64   Temp (!) 97.5 F (36.4 C) (Oral)   Resp 18   Ht 5' 10 (1.778 m)   Wt 103.8 kg   SpO2 99%   BMI 32.83 kg/m   Physical Exam:     General adult male in bed in no acute distress HEENT normocephalic atraumatic extraocular movements intact sclera anicteric Neck supple trachea midline Lungs clear to auscultation bilaterally normal work of breathing at rest on room air  Heart S1S2 no rub Abdomen soft nontender nondistended Extremities 1-2+ edema lower extremities Psych normal mood and affect Access RIJ tunneled catheter in place and LUE AVF with bruit and thrill; some bruising and trace/1+ edema - pt states both better   Labs: BMET Recent Labs  Lab 10/09/23 1629 10/10/23 0830 10/13/23 0051 10/13/23 0506 10/14/23 0830 10/15/23 0710  NA 140 137 137 137 135 134*  K 4.3 3.7 3.6 3.5 3.4* 3.3*  CL 98 98 98 101 97* 97*  CO2  --  25 27 25 26 28   GLUCOSE 105* 215* 127* 107* 118* 100*  BUN 105* 90* 50* 51* 55* 35*  CREATININE 4.80* 4.74* 3.50* 3.49* 4.02* 3.28*  CALCIUM   --  8.8* 8.2* 8.2* 8.3* 8.2*  PHOS  --  6.4*  --  4.4 5.0* 3.9   CBC Recent Labs  Lab 10/11/23 0712 10/12/23 0547 10/14/23 0830 10/15/23 0710  WBC 10.7* 11.4* 12.6* 11.2*  HGB 8.5* 8.8* 8.4* 8.4*  HCT 28.0* 28.9* 28.4* 28.4*  MCV 90.0 90.3 89.9 89.9  PLT 209 215 197 181      Medications:     Chlorhexidine  Gluconate Cloth  6 each Topical Q0600   Chlorhexidine  Gluconate Cloth  6 each Topical Q0600   [START ON 10/17/2023] darbepoetin (ARANESP ) injection - NON-DIALYSIS  150 mcg Subcutaneous Q Sat-1800   furosemide   80 mg Oral Q T,Th,S,Su   heparin  injection (subcutaneous)  5,000 Units Subcutaneous Q8H   insulin  aspart  0-9 Units Subcutaneous TID WC   pantoprazole   40 mg Oral BID   tamsulosin   0.4 mg Oral QPC supper   Nan Aver, MD 10/15/2023, 3:02 PM

## 2023-10-15 NOTE — Discharge Planning (Signed)
 [facility]  Initial Hemodialysis Orders  Dialysis center: [facility]  Patient's name: Calvin Schultz DOB: April 26, 1951 AKI or ESRD: ESRD  Discharge diagnosis: AoCKD 5 now ESRD AFib, HTN, DMT2 HFpEF   Allergies: No Known Allergies  Date of First Dialysis: 10/10/2023 Cause of renal disease: Cardiorenal  Dialysis Prescription: Dialysis Frequency: MWF Tx duration: 4 hours BFR: 400 DFR: Autoflow 1.5 EDW: 103 kg  Dialyzer: 180NRe UF profile/Sodium modeling?: No Dialysis Bath: 3.0 K 2.5 Ca Weekly K+ levels while on 3.0 K bath  Dialysis access: Access type: RIJ TDC and 1st Stage brachiobasilic AVF  Date placed: 10/09/2023 Surgeon: Dr. Susi Eric Needle gauge: NA  In Center Medications: Heparin  Dose: None  Type: NA VDRA: Calcitriol/Hectorol Per protocol Venofer : Per protocol Mircera: Per protocol  Aranesp  100 mcg [place] given 10/10/2023   Discharge labs: Hgb: 8.4 K+: 3.3  Ca: 8.2  Phos: 3.9 Alb: 2.3  Please draw Monthly labs on admission. Additional labs needed: Weekly K+ levels while on 3.0 K bath Jacobo Masters [facility] [phone]

## 2023-10-15 NOTE — Progress Notes (Signed)
 [facility] Progress Note Patient: Calvin Schultz ZOX:[id number] DOB: March 02, 1951 DOA: 10/01/2023  DOS: the patient was seen and examined on 10/15/2023  Brief Hospital Course: PMH of CKD 5, chronic A-fib, T2DM, HLD, HTN, BPH presented to the hospital with complaints of worsening swelling of his legs. Was recently hospitalized for septic shock secondary to C. difficile colitis.  Lasix  was discontinued.  Presented to the hospital now with worsening swelling for last 6 weeks progressively worsening. Nephrology consulted. Started on IV diuresis.  With limited improvement and therefore was started on HD on 6/7.  TDC was placed as well as aVF was placed on 6/6.  Assessment and Plan: CKD stage V.  Now progressing to ESRD. Acute on chronic diastolic CHF Anasarca. Hypoalbuminemia with third spacing. Baseline serum creatinine appears to be around 5.6.  On admission serum creatinine was 4.5 Initiated on IV diuresis with very slow improvement in edema. Nephrology was consulted. Currently on hemodialysis with improvement in swelling. Plan is to continue HD outpatient.  Anemia of chronic kidney disease with acute worsening. Baseline hemoglobin 8.6. On admission hemoglobin was 6.0. Likely dilutional in nature due to volume overload. After 2 PRBC hemoglobin elevated appropriately on remaining stable. Transfuse for hemoglobin less than 8.  Bilateral heel ulcer. Outpatient podiatry follow-up recommended, referral placed. Appreciate wound care in the hospital.   HTN. Continue home blood pressure medication for now but holding Norvasc .  BPH. Flomax .  Will continue.  Chronic muscle spasm. On Zanaflex .  Needed.  Metabolic acidosis.   In setting of CKD.   Subjective: No acute complaint.  No nausea no vomiting.  Physical Exam: General: in Mild distress, No Rash Cardiovascular: S1 and S2 Present, No Murmur Respiratory: Good respiratory effort, Bilateral Air entry present. No Crackles, No  wheezes Abdomen: Bowel Sound present, No tenderness Extremities: bilateral edema Neuro: Alert and oriented x3, no new focal deficit  Data Reviewed: I have Reviewed nursing notes, Vitals, and Lab results. Since last encounter, pertinent lab results CBC and BMP   . I have discussed pt's care plan and test results with BMP  .  Nephrology discussed.  Disposition: Status is: Inpatient Remains inpatient appropriate because: Awaiting placement  heparin  injection 5,000 Units Start: 10/06/23 2200 Place TED hose Start: 10/01/23 1938   Family Communication: No one at bedside Level of care: Telemetry Medical continue Vitals:   10/15/23 0455 10/15/23 0734 10/15/23 0734 10/15/23 1731  BP: 109/61 133/71 133/71 129/69  Pulse: (!) 59 65 64 64  Resp: 18 18 18 18   Temp: 98.3 F (36.8 C) (!) 97.5 F (36.4 C) (!) 97.5 F (36.4 C)   TempSrc:  Oral Oral   SpO2: 99% 100% 99% 99%  Weight:      Height:         Author: Charlean Congress, MD 10/15/2023 6:23 PM  Please look on [email or web address] to find out who is on call.

## 2023-10-15 NOTE — Progress Notes (Signed)
 Physical Therapy Treatment Patient Details Name: Calvin Schultz MRN: [id number] DOB: 08-16-1950 Today's Date: 10/15/2023   History of Present Illness 73 y.o. male who presents on 10/01/23 with worsening body edema and B foot wounds. PMH:  A-fib, CKD stage V, T2DM, HLD, HTN and BPH    PT Comments  Pt received in bed and motivated to work with therapy and get to chair. Pt continues to have tightness B ankle and knees. Performed LE there ex in bed but then also sitting EOB with knees bent. Pt able to balance edge of bed better this session than last with this therapist. Pt maintained unsupported sitting x10 mins with supervision. Transferred to recliner with maximove with pt in seated position in sling. Pt continued to work on LE there ex in chair as well as stretching to B knees. Patient will benefit from continued inpatient follow up therapy, <3 hours/day.     If plan is discharge home, recommend the following: Two people to help with walking and/or transfers;Two people to help with bathing/dressing/bathroom;Assist for transportation;Assistance with cooking/housework   Can travel by private vehicle     No  Equipment Recommendations  None recommended by PT    Recommendations for Other Services       Precautions / Restrictions Precautions Precautions: Fall Restrictions Weight Bearing Restrictions Per Provider Order: No     Mobility  Bed Mobility Overal bed mobility: Needs Assistance Bed Mobility: Rolling, Sidelying to Sit Rolling: Used rails, Mod assist Sidelying to sit: Mod assist, +2 for physical assistance, Used rails       General bed mobility comments: rolled R and L for placement of lift pa. Uses rails, has easier time rolling to L. Needs assist at LE's and trunk to come up to sitting. Able to gain balance edge of bed faster this session than last with this PT    Transfers Overall transfer level: Needs assistance Equipment used: Ambulation equipment used Transfers: Bed to  chair/wheelchair/BSC             General transfer comment: pt to recliner in maximove in chair position Transfer via Lift Equipment: Maximove  Ambulation/Gait               General Gait Details: unable at baseline   Stairs             Wheelchair Mobility     Tilt Bed    Modified Rankin (Stroke Patients Only)       Balance Overall balance assessment: Needs assistance Sitting-balance support: Feet supported, Bilateral upper extremity supported Sitting balance-Leahy Scale: Fair Sitting balance - Comments: pt tolerated sitting EOB with UE support but no external assist 10 mins. Postural control: Posterior lean     Standing balance comment: unable                            Communication Communication Communication: No apparent difficulties  Cognition Arousal: Alert Behavior During Therapy: WFL for tasks assessed/performed   PT - Cognitive impairments: No apparent impairments                         Following commands: Intact      Cueing Cueing Techniques: Verbal cues  Exercises General Exercises - Lower Extremity Ankle Circles/Pumps: AROM, Both, 20 reps, Supine Quad Sets: AROM, Both, 10 reps, Seated Gluteal Sets: AROM, 10 reps, Seated Long Arc Quad: AROM, Both, 10 reps, Seated Heel Slides: AROM, Both, 5  reps, Supine Other Exercises Other Exercises: maintained knees bent in recliner first 15 mins before LE's elevated    General Comments        Pertinent Vitals/Pain Pain Assessment Pain Assessment: Faces Faces Pain Scale: Hurts a little bit Pain Location: generalized Pain Descriptors / Indicators: Discomfort, Grimacing Pain Intervention(s): Limited activity within patient's tolerance, Monitored during session    Home Living                          Prior Function            PT Goals (current goals can now be found in the care plan section) Acute Rehab PT Goals Patient Stated Goal: return home PT  Goal Formulation: With patient Time For Goal Achievement: 10/19/23 Potential to Achieve Goals: Fair Progress towards PT goals: Progressing toward goals    Frequency    Min 1X/week      PT Plan      Co-evaluation              AM-PAC PT 6 Clicks Mobility   Outcome Measure  Help needed turning from your back to your side while in a flat bed without using bedrails?: A Lot Help needed moving from lying on your back to sitting on the side of a flat bed without using bedrails?: A Lot Help needed moving to and from a bed to a chair (including a wheelchair)?: Total Help needed standing up from a chair using your arms (e.g., wheelchair or bedside chair)?: Total Help needed to walk in hospital room?: Total Help needed climbing 3-5 steps with a railing? : Total 6 Click Score: 8    End of Session Equipment Utilized During Treatment: Gait belt Activity Tolerance: Patient tolerated treatment well Patient left: in chair;with call bell/phone within reach;with chair alarm set Nurse Communication: Mobility status;Need for lift equipment PT Visit Diagnosis: Muscle weakness (generalized) (M62.81);Pain Pain - Right/Left: Right (groin) Pain - part of body: Shoulder     Time: 4098-1191 PT Time Calculation (min) (ACUTE ONLY): 30 min  Charges:    $Therapeutic Exercise: 8-22 mins $Therapeutic Activity: 8-22 mins PT General Charges $$ ACUTE PT VISIT: 1 Visit                     Amey Ka, PT  Acute Rehab Services Secure chat preferred Office [phone]    Deloris Fetters Madalene Mickler 10/15/2023, 11:35 AM

## 2023-10-16 DIAGNOSIS — N185 Chronic kidney disease, stage 5: Secondary | ICD-10-CM | POA: Diagnosis not present

## 2023-10-16 LAB — CBC
HCT: 29 % — ABNORMAL LOW (ref 39.0–52.0)
Hemoglobin: 8.7 g/dL — ABNORMAL LOW (ref 13.0–17.0)
MCH: 26.9 pg (ref 26.0–34.0)
MCHC: 30 g/dL (ref 30.0–36.0)
MCV: 89.8 fL (ref 80.0–100.0)
Platelets: 175 10*3/uL (ref 150–400)
RBC: 3.23 MIL/uL — ABNORMAL LOW (ref 4.22–5.81)
RDW: 17.2 % — ABNORMAL HIGH (ref 11.5–15.5)
WBC: 10.9 10*3/uL — ABNORMAL HIGH (ref 4.0–10.5)
nRBC: 0 % (ref 0.0–0.2)

## 2023-10-16 LAB — RENAL FUNCTION PANEL
Albumin: 2.5 g/dL — ABNORMAL LOW (ref 3.5–5.0)
Anion gap: 14 (ref 5–15)
BUN: 42 mg/dL — ABNORMAL HIGH (ref 8–23)
CO2: 26 mmol/L (ref 22–32)
Calcium: 8.8 mg/dL — ABNORMAL LOW (ref 8.9–10.3)
Chloride: 98 mmol/L (ref 98–111)
Creatinine, Ser: 3.74 mg/dL — ABNORMAL HIGH (ref 0.61–1.24)
GFR, Estimated: 17 mL/min — ABNORMAL LOW (ref >60)
Glucose, Bld: 114 mg/dL — ABNORMAL HIGH (ref 70–99)
Phosphorus: 4.4 mg/dL (ref 2.5–4.6)
Potassium: 3.4 mmol/L — ABNORMAL LOW (ref 3.5–5.1)
Sodium: 138 mmol/L (ref 135–145)

## 2023-10-16 LAB — GLUCOSE, CAPILLARY
Glucose-Capillary: 72 mg/dL (ref 70–99)
Glucose-Capillary: 146 mg/dL — ABNORMAL HIGH (ref 70–99)

## 2023-10-16 LAB — MAGNESIUM: Magnesium: 1.6 mg/dL — ABNORMAL LOW (ref 1.7–2.4)

## 2023-10-16 MED ORDER — LIDOCAINE-PRILOCAINE 2.5-2.5 % EX CREA: 1.0000 | TOPICAL_CREAM | CUTANEOUS | Status: DC | PRN

## 2023-10-16 MED ORDER — PANTOPRAZOLE SODIUM 40 MG PO TBEC: 40.0000 mg | DELAYED_RELEASE_TABLET | Freq: Every day | ORAL | 0 refills | Status: DC

## 2023-10-16 MED ORDER — HEPARIN SODIUM (PORCINE) 1000 UNIT/ML IJ SOLN
INTRAMUSCULAR | Status: AC
  Filled 2023-10-16: qty 4

## 2023-10-16 MED ORDER — LIDOCAINE HCL (PF) 1 % IJ SOLN: 5.0000 mL | INTRAMUSCULAR | Status: DC | PRN

## 2023-10-16 MED ORDER — TAMSULOSIN HCL 0.4 MG PO CAPS: 0.4000 mg | ORAL_CAPSULE | Freq: Every day | ORAL | 0 refills | Status: DC

## 2023-10-16 MED ORDER — SODIUM CHLORIDE 0.9% FLUSH
10.0000 mL | Freq: Two times a day (BID) | INTRAVENOUS | Status: DC
  Administered 2023-10-16: 10 mL

## 2023-10-16 MED ORDER — SODIUM CHLORIDE 0.9% FLUSH: 10.0000 mL | INTRAVENOUS | Status: DC | PRN

## 2023-10-16 MED ORDER — PENTAFLUOROPROP-TETRAFLUOROETH EX AERO: 1.0000 | INHALATION_SPRAY | CUTANEOUS | Status: DC | PRN

## 2023-10-16 MED ORDER — ANTICOAGULANT SODIUM CITRATE 4% (200MG/5ML) IV SOLN: 5.0000 mL | Status: DC | PRN

## 2023-10-16 MED ORDER — FUROSEMIDE 80 MG PO TABS: 80.0000 mg | ORAL_TABLET | ORAL | 0 refills | Status: DC

## 2023-10-16 MED ORDER — ALTEPLASE 2 MG IJ SOLR: 2.0000 mg | Freq: Once | INTRAMUSCULAR | Status: DC | PRN

## 2023-10-16 MED ORDER — HEPARIN SODIUM (PORCINE) 1000 UNIT/ML DIALYSIS
1000.0000 [IU] | INTRAMUSCULAR | Status: DC | PRN
  Administered 2023-10-16: 3200 [IU]

## 2023-10-16 MED ORDER — TRAZODONE HCL 50 MG PO TABS: 50.0000 mg | ORAL_TABLET | Freq: Every evening | ORAL | 0 refills | Status: DC | PRN

## 2023-10-16 MED ORDER — MAGNESIUM SULFATE IN D5W 1-5 GM/100ML-% IV SOLN
1.0000 g | Freq: Once | INTRAVENOUS | Status: AC
  Administered 2023-10-16: 1 g via INTRAVENOUS
  Filled 2023-10-16: qty 100

## 2023-10-16 NOTE — Progress Notes (Signed)
 Discharge orders received. AVS printed and placed in discharge packet. Attempted to call [facility] to provide report but did not get an answer.

## 2023-10-16 NOTE — Progress Notes (Addendum)
 Contacted [facility] this morning to advise clinic staff that pt will not be starting today due to insurance auth for snf pending. Will update clinic on pt's d/c date once known. Will assist as needed.   Lauraine Polite Renal Navigator [phone]  Addendum at 4:38 pm: Advised by CSW that snf has received auth. Pt to d/c to snf today. Contacted [facility] to be advised of pt's d/c today and that pt should start on Monday. Renal NP sent orders to clinic yesterday and clinic confirmed this morning that orders were received. Met with pt and pt's wife at bedside to provide HD information and schedule letter. Both aware pt will start on Monday. CSW to remind snf that hoyer pad/sling needs to be sent to HD appts with pt. HD arrangements placed on AVS.

## 2023-10-16 NOTE — Discharge Summary (Signed)
 Physician Discharge Summary   Patient: Calvin Schultz MRN: [id number] DOB: 12/06/1950  Admit date:     10/01/2023  Discharge date: 10/16/23  Discharge Physician: Charlean Congress  PCP: Audria Leather, MD  Recommendations at discharge: Follow up with PCP in 1 week Follow up with vascular surgery  Reestablish care with podiatry    Follow-up Information     [facility] at [facility] Follow up in 6 week(s).   Specialty: Vascular Surgery Why: Office will call you to arrange your appt (sent). Contact information: [address] [place]   [id number] [phone]        [facility]. Go on 10/19/2023.   Why: Schedule is Monday, Wednesday, Friday with 11:30 am chair time.  On Monday (6/16), please arrive at 10:00 am to complete paperwork prior to treatment.  Please send hoyer pad with pt to HD appointments if needed. Contact information: [address] [place] [address] [phone]         Audria Leather, MD. Schedule an appointment as soon as possible for a visit in 1 week(s).   Specialty: Family Medicine Why: with BMP lab to look at kidney and electrolytes, with CBC lab to look at blood counts Contact information: [address] [address] [phone]         Charity Conch, DPM. Schedule an appointment as soon as possible for a visit.   Specialties: Podiatry, Surgery Why: To Establish Care Contact information: [address] [place] [address] [place] [address] [phone]                Discharge Diagnoses: Principal Problem:   Chronic kidney disease (CKD), stage V (HCC) Active Problems:   Normocytic anemia   Bilateral lower extremity edema   Metabolic acidosis   Controlled diabetes mellitus type 2 with complications [facility])   Anasarca  Hospital Course: PMH of CKD 5, chronic A-fib, T2DM, HLD, HTN, BPH presented to the  hospital with complaints of worsening swelling of his legs. Was recently hospitalized for septic shock secondary to C. difficile colitis.  Lasix  was discontinued.  Presented to the hospital now with worsening swelling for last 6 weeks progressively worsening. Nephrology consulted. Started on IV diuresis.  With limited improvement and therefore was started on HD on 6/7.  TDC was placed as well as aVF was placed on 6/6.  Assessment and Plan: CKD stage V.  Now progressing to ESRD. Acute on chronic diastolic CHF Anasarca. Hypoalbuminemia with third spacing. Baseline serum creatinine appears to be around 5.6.  On admission serum creatinine was 4.5 Initiated on IV diuresis with very slow improvement in edema. Nephrology was consulted. Currently on hemodialysis with improvement in swelling. Plan is to continue HD outpatient.  Anemia of chronic kidney disease with acute worsening. Baseline hemoglobin 8.6. On admission hemoglobin was 6.0. Likely dilutional in nature due to volume overload. After 2 PRBC hemoglobin elevated appropriately on remaining stable.  Bilateral heel ulcer. Outpatient podiatry follow-up recommended, referral placed. Appreciate wound care in the hospital.   HTN. Continue home blood pressure medication for now but holding Norvasc .  BPH. Flomax .  Will continue.  Chronic muscle spasm. On Zanaflex .  Needed.  Metabolic acidosis.   In setting of CKD.  Obesity Class 1 Body mass index is 31.28 kg/m.  Placing the pt at higher risk of poor outcomes.   Consultants:  Nephrology  Vascular surgery  Procedures performed:  HD AVF placement  TDC placement   DISCHARGE MEDICATION: Allergies as of 10/16/2023   No Known Allergies      Medication List     STOP taking these medications    amLODipine  5 MG tablet Commonly known as: NORVASC    diclofenac  Sodium 1 % Gel Commonly known as: VOLTAREN    HYDROcodone -acetaminophen  5-325 MG tablet Commonly known as:  NORCO/VICODIN   Levemir 100 UNIT/ML injection Generic drug: insulin  detemir   sodium bicarbonate  650 MG tablet   traMADol  50 MG tablet Commonly known as: ULTRAM        TAKE these medications    acetaminophen  325 MG tablet Commonly known as: TYLENOL  Take 2 tablets (650 mg total) by mouth every 4 (four) hours as needed for mild pain (pain score 1-3) or fever.   furosemide  80 MG tablet Commonly known as: LASIX  Take 1 tablet (80 mg total) by mouth every Tuesday, Thursday, Saturday, and Sunday. Start taking on: October 17, 2023   Gerhardt's butt cream Crea Apply 1 Application topically 2 (two) times daily.   iron  polysaccharides 150 MG capsule Commonly known as: NIFEREX Take 1 capsule (150 mg total) by mouth daily.   multivitamin Tabs tablet Take 1 tablet by mouth at bedtime.   pantoprazole  40 MG tablet Commonly known as: PROTONIX  Take 1 tablet (40 mg total) by mouth daily.   psyllium 95 % Pack Commonly known as: HYDROCIL/METAMUCIL Take 1 packet by mouth 2 (two) times daily.   tamsulosin  0.4 MG Caps capsule Commonly known as: FLOMAX  Take 1 capsule (0.4 mg total) by mouth daily after supper. What changed: how much to take   tiZANidine  4 MG tablet Commonly known as: ZANAFLEX  Take 1 tablet (4 mg total) by mouth 2 (two) times daily as needed for muscle spasms.   traZODone  50 MG tablet Commonly known as: DESYREL  Take 1 tablet (50 mg total) by mouth at bedtime as needed for sleep. What changed:  medication strength how much to take additional instructions               Discharge Care Instructions  (From admission, onward)           Start     Ordered   10/16/23 0000  Discharge wound care:       Comments: Cleanse R heel and L foot wounds with Vashe wound cleanser do not rinse and allow to dry.  Apply Xeroform gauze to wound beds daily, secure with silicone foam or dry gauze and Kerlix roll gauze whichever is preferred. Place feet in prevalon boots to offload  pressure   10/16/23 1616           Disposition: SNF Diet recommendation: Renal diet  Discharge Exam: Vitals:   10/16/23 1130 10/16/23 1143 10/16/23 1145 10/16/23 1248  BP: 99/62 124/67 107/74 104/67  Pulse: 65 66 65 65  Resp: 12 17 (!) 21 18  Temp:  97.6 F (36.4 C) 97.6 F (36.4 C) 97.7 F (36.5 C)  TempSrc:      SpO2: 100% 100% 99% 97%  Weight:   98.9 kg   Height:       General: Appear in mild distress; no visible Abnormal Neck Mass Or lumps, Conjunctiva normal Cardiovascular: S1 and S2 Present, no Murmur, Respiratory: good respiratory effort, Bilateral Air entry present and CTA, no Crackles, no wheezes Abdomen: Bowel Sound present, Non tender  Extremities: bilateral Pedal edema Neurology: alert and oriented to time, place, and person  [facility] Weights   10/16/23  0500 10/16/23 0804 10/16/23 1145  Weight: 102.7 kg 103.2 kg 98.9 kg   Condition at discharge: stable  The results of significant diagnostics from this hospitalization (including imaging, microbiology, ancillary and laboratory) are listed below for reference.   Imaging Studies: ECHOCARDIOGRAM LIMITED Result Date: 10/13/2023    ECHOCARDIOGRAM LIMITED REPORT   Patient Name:   SPURGEON GANCARZ Date of Exam: 10/13/2023 Medical Rec #:  [id number]   Height:       70.0 in Accession #:    [id number]  Weight:       229.7 lb Date of Birth:  October 17, 1950  BSA:          2.214 m Patient Age:    72 years    BP:           116/68 mmHg Patient Gender: M           HR:           76 bpm. Exam Location:  Inpatient Procedure: Limited Echo (Both Spectral and Color Flow Doppler were utilized            during procedure). Indications:    Ventricular Tachycardia I47.2  History:        Patient has prior history of Echocardiogram examinations, most                 recent 05/31/2023.  Sonographer:    Astrid Blamer Referring Phys: [id number] Maylene Spear IMPRESSIONS  1. Left ventricular ejection fraction, by estimation, is 45 to 50%. The left ventricle has  mildly decreased function. The left ventricle demonstrates global hypokinesis.  2. Right ventricular systolic function is normal. The right ventricular size is not well visualized.  3. The aortic valve is tricuspid. There is mild calcification of the aortic valve. Aortic valve sclerosis is present, with no evidence of aortic valve stenosis.  4. Aortic dilatation noted. There is borderline dilatation of the aortic arch, measuring 36 mm.  5. The inferior vena cava is dilated in size with <50% respiratory variability, suggesting right atrial pressure of 15 mmHg. Comparison(s): Prior images reviewed side by side. LVEF is less vigorous from prior. FINDINGS  Left Ventricle: Left ventricular ejection fraction, by estimation, is 45 to 50%. The left ventricle has mildly decreased function. The left ventricle demonstrates global hypokinesis. Right Ventricle: The right ventricular size is not well visualized. No increase in right ventricular wall thickness. Right ventricular systolic function is normal. Tricuspid Valve: The tricuspid valve is normal in structure. No evidence of tricuspid stenosis. Aortic Valve: The aortic valve is tricuspid. There is mild calcification of the aortic valve. Aortic valve sclerosis is present, with no evidence of aortic valve stenosis. Aorta: The aortic root is normal in size and structure and aortic dilatation noted. There is borderline dilatation of the aortic arch, measuring 36 mm. Venous: The inferior vena cava is dilated in size with less than 50% respiratory variability, suggesting right atrial pressure of 15 mmHg. LEFT VENTRICLE PLAX 2D LVIDd:         5.00 cm LVIDs:         3.80 cm LV PW:         1.20 cm LV IVS:        1.20 cm LVOT diam:     2.10 cm LVOT Area:     3.46 cm  LEFT ATRIUM         Index LA diam:    4.50 cm 2.03 cm/m   AORTA Ao Root diam: 3.70 cm Ao  Asc diam:  3.80 cm  SHUNTS Systemic Diam: 2.10 cm Gloriann Larger MD Electronically signed by Gloriann Larger MD  Signature Date/Time: 10/13/2023/4:42:18 PM    Final    DG CHEST PORT 1 VIEW Result Date: 10/13/2023 CLINICAL DATA:  Cardiac palpitations. EXAM: PORTABLE CHEST 1 VIEW COMPARISON:  October 09, 2023. FINDINGS: Stable cardiomegaly. Right internal jugular Port-A-Cath is unchanged. Probable minimal bibasilar subsegmental atelectasis. Possible minimal left pleural effusion. Bony thorax is unremarkable. IMPRESSION: Probable minimal bibasilar subsegmental atelectasis with possible minimal left pleural effusion. Electronically Signed   By: Rosalene Colon M.D.   On: 10/13/2023 10:01   DG Chest Port 1 View Result Date: 10/09/2023 CLINICAL DATA:  Status post dialysis catheter insertion EXAM: PORTABLE CHEST 1 VIEW COMPARISON:  10/01/2023 FINDINGS: Stable cardiomegaly. Perihilar predominant airspace and interstitial opacities suggestive of edema similar to prior. Small left pleural effusion. No pneumothorax. Interval placement of a right IJ dialysis catheter with tip at the superior cavoatrial junction. IMPRESSION: 1. Right IJ dialysis catheter tip at the superior cavoatrial junction. No pneumothorax. 2. Similar pulmonary edema and small left pleural effusion. Electronically Signed   By: Rozell Cornet M.D.   On: 10/09/2023 19:08   DG C-Arm 1-60 Min Result Date: 10/09/2023 CLINICAL DATA:  Insertion of dialysis catheter using palindrome catheter kit EXAM: DG C-ARM 1-60 MIN FLUOROSCOPY: Fluoroscopy Time:  26.6 seconds Radiation Exposure Index (if provided by the fluoroscopic device): 7.4 mGy Number of Acquired Spot Images: 1 COMPARISON:  None Available. FINDINGS: Single intra procedural fluoroscopic image during insertion of a dialysis catheter. The catheter tip projects at the level of the low IVC. IMPRESSION: Single intra procedural fluoroscopic image during insertion of a dialysis catheter. The catheter tip projects at the level of the low IVC. Electronically Signed   By: Rozell Cornet M.D.   On: 10/09/2023 19:05   VAS US   UPPER EXT VEIN MAPPING (PRE-OP  AVF) Result Date: 10/09/2023 UPPER EXTREMITY VEIN MAPPING Patient Name:  MORDECHAI MATUSZAK  Date of Exam:   10/09/2023 Medical Rec #: [id number]    Accession #:    [id number] Date of Birth: [birth year]   Patient Gender: M Patient Age:   6 years Exam Location:  [facility] Procedure:      VAS US  UPPER EXT VEIN MAPPING (PRE-OP  AVF) Referring Phys: Adrian Alba --------------------------------------------------------------------------------  Indications: Pre-access. History: ESRD - need for permanant access.  Limitations: subcutaneous edema (LUE) Comparison Study: No previous exams Performing Technologist: Jody Hill RVT, RDMS  Examination Guidelines: A complete evaluation includes B-mode imaging, spectral Doppler, color Doppler, and power Doppler as needed of all accessible portions of each vessel. Bilateral testing is considered an integral part of a complete examination. Limited examinations for reoccurring indications may be performed as noted. +-----------------+-------------+----------+---------+ Right Cephalic   Diameter (cm)Depth (cm)Findings  +-----------------+-------------+----------+---------+ Shoulder             0.28        1.19             +-----------------+-------------+----------+---------+ Prox upper arm       0.35        1.06             +-----------------+-------------+----------+---------+ Mid upper arm        0.17        0.52   branching +-----------------+-------------+----------+---------+ Dist upper arm       0.15        0.38             +-----------------+-------------+----------+---------+  Antecubital fossa    0.30        0.36             +-----------------+-------------+----------+---------+ Prox forearm         0.17        0.65   branching +-----------------+-------------+----------+---------+ Mid forearm          0.17        0.62             +-----------------+-------------+----------+---------+ Dist forearm          0.12        0.34             +-----------------+-------------+----------+---------+ Wrist                0.17        0.33             +-----------------+-------------+----------+---------+ +-----------------+-------------+----------+---------+ Right Basilic    Diameter (cm)Depth (cm)Findings  +-----------------+-------------+----------+---------+ Prox upper arm       0.65        1.42             +-----------------+-------------+----------+---------+ Mid upper arm        0.36        1.57   branching +-----------------+-------------+----------+---------+ Dist upper arm       0.33        1.20   branching +-----------------+-------------+----------+---------+ Antecubital fossa    0.17        1.00             +-----------------+-------------+----------+---------+ Prox forearm         0.15        0.50             +-----------------+-------------+----------+---------+ Mid forearm          0.13        0.39             +-----------------+-------------+----------+---------+ Distal forearm       0.14        0.26             +-----------------+-------------+----------+---------+ Wrist                0.12        0.22             +-----------------+-------------+----------+---------+ +-----------------+-------------+----------+----------------------------+ Left Cephalic    Diameter (cm)Depth (cm)          Findings           +-----------------+-------------+----------+----------------------------+ Shoulder             0.22        1.30                                +-----------------+-------------+----------+----------------------------+ Prox upper arm       0.19        0.94                                +-----------------+-------------+----------+----------------------------+ Mid upper arm        0.20        0.90            branching           +-----------------+-------------+----------+----------------------------+ Dist upper arm       0.20        0.73                                 +-----------------+-------------+----------+----------------------------+  Antecubital fossa    0.50        0.28   branching and acute thrombus +-----------------+-------------+----------+----------------------------+ Prox forearm         0.22        0.86                                +-----------------+-------------+----------+----------------------------+ Mid forearm          0.19        0.76                                +-----------------+-------------+----------+----------------------------+ Dist forearm         0.29        0.43          acute thrombus        +-----------------+-------------+----------+----------------------------+ Wrist                0.13        0.36                                +-----------------+-------------+----------+----------------------------+ +-----------------+-------------+----------+--------------+ Left Basilic     Diameter (cm)Depth (cm)   Findings    +-----------------+-------------+----------+--------------+ Prox upper arm       0.42        1.25                  +-----------------+-------------+----------+--------------+ Mid upper arm        0.37        1.53     branching    +-----------------+-------------+----------+--------------+ Dist upper arm       0.31        1.04     branching    +-----------------+-------------+----------+--------------+ Antecubital fossa    0.25        1.01                  +-----------------+-------------+----------+--------------+ Prox forearm         0.26        1.20                  +-----------------+-------------+----------+--------------+ Mid forearm                             not visualized +-----------------+-------------+----------+--------------+ Distal forearm       0.17        0.74                  +-----------------+-------------+----------+--------------+ Wrist                0.12        0.43                   +-----------------+-------------+----------+--------------+ IV placement to LUE cephalic vein distal forearm. Subcutaneous edema of LUE (AC and forearm). Summary:  Left: Incidental finding of acute cephalic SVT. *See table(s) above for measurements and observations.  Diagnosing physician: Delaney Fearing Electronically signed by Delaney Fearing on 10/09/2023 at 6:46:41 PM.    Final    DG Chest 2 View Result Date: 10/01/2023 CLINICAL DATA:  Cough.  Extremity swelling. EXAM: CHEST - 2 VIEW COMPARISON:  06/27/2023 FINDINGS: The previous left-sided central line has been removed. Cardiomegaly is unchanged. Aortic atherosclerosis. There are small  bilateral pleural effusions. Vascular congestion. No pneumothorax. IMPRESSION: Cardiomegaly with vascular congestion. Small bilateral pleural effusions. Electronically Signed   By: Chadwick Colonel M.D.   On: 10/01/2023 18:45    Microbiology: Results for orders placed or performed during the hospital encounter of 10/01/23  Surgical pcr screen     Status: None   Collection Time: 10/08/23  9:43 PM   Specimen: Nasal Mucosa; Nasal Swab  Result Value Ref Range Status   MRSA, PCR NEGATIVE NEGATIVE Final   Staphylococcus aureus NEGATIVE NEGATIVE Final    Comment: (NOTE) The Xpert SA Assay (FDA approved for NASAL specimens in patients 18 years of age and older), is one component of a comprehensive surveillance program. It is not intended to diagnose infection nor to guide or monitor treatment. Performed at [facility], [address] [place], [place] [address]    Labs: CBC: Recent Labs  Lab 10/11/23 [id number] 10/12/23 0547 10/14/23 0830 10/15/23 0710 10/16/23 0619  WBC 10.7* 11.4* 12.6* 11.2* 10.9*  HGB 8.5* 8.8* 8.4* 8.4* 8.7*  HCT 28.0* 28.9* 28.4* 28.4* 29.0*  MCV 90.0 90.3 89.9 89.9 89.8  PLT 209 215 197 181 175   Basic Metabolic Panel: Recent Labs  Lab 10/10/23 0830 10/13/23 0051 10/13/23 0506 10/14/23 0830 10/15/23 0710 10/16/23 0619  NA  137 137 137 135 134* 138  K 3.7 3.6 3.5 3.4* 3.3* 3.4*  CL 98 98 101 97* 97* 98  CO2 25 27 25 26 28 26   GLUCOSE 215* 127* 107* 118* 100* 114*  BUN 90* 50* 51* 55* 35* 42*  CREATININE 4.74* 3.50* 3.49* 4.02* 3.28* 3.74*  CALCIUM  8.8* 8.2* 8.2* 8.3* 8.2* 8.8*  MG 1.8 1.7  --   --  1.8 1.6*  PHOS 6.4*  --  4.4 5.0* 3.9 4.4   Liver Function Tests: Recent Labs  Lab 10/10/23 0830 10/13/23 0506 10/14/23 0830 10/15/23 0710 10/16/23 0619  ALBUMIN  2.5* 2.3* 2.3* 2.3* 2.5*   CBG: Recent Labs  Lab 10/15/23 0734 10/15/23 1155 10/15/23 1732 10/15/23 2045 10/16/23 1257  GLUCAP 113* 199* 113* 210* 72    Discharge time spent: greater than 30 minutes.  Author: Charlean Congress, MD  [facility]

## 2023-10-16 NOTE — TOC Transition Note (Signed)
 Transition of Care [facility]) - Discharge Note   Patient Details  Name: Calvin Schultz MRN: [id number] Date of Birth: 1951/01/19  [facility] CM/SW Contact:  Jeffory M[place], [place] Phone Number: 10/16/2023, 4:33 PM   Clinical Narrative:  Pt for dc to [facility] today. Spoke to Tammy in admissions who confirmed auth received and they are prepared to admit pt to room 138. Pt's wife at bedside and agreeable to dc plan. RN provided with number for report and PTAR arranged for transport. SW signing off at dc.   Paullette Boston, MSW, LCSW [phone] (coverage)       Final next level of care: Skilled Nursing Facility Barriers to Discharge: Barriers Resolved   Patient Goals and CMS Choice     Choice offered to / list presented to : Spouse      Discharge Placement              Patient chooses bed at: Other - please specify in the comment section below: [facility]) Patient to be transferred to facility by: PTAR Name of family member notified: Sylvia/spouse Patient and family notified of of transfer: 10/16/23  Discharge Plan and Services Additional resources added to the After Visit Summary for                                       Social Drivers of Health (SDOH) Interventions SDOH Screenings   Food Insecurity: No Food Insecurity (10/02/2023)  Housing: Low Risk  (10/02/2023)  Transportation Needs: No Transportation Needs (10/02/2023)  Utilities: Not At Risk (10/02/2023)  Social Connections: Moderately Isolated (10/02/2023)  Tobacco Use: Low Risk  (10/02/2023)     Readmission Risk Interventions    07/10/2023   11:21 AM 06/27/2023    1:00 PM  Readmission Risk Prevention Plan  Transportation Screening Complete Complete  PCP or Specialist Appt within 5-7 Days  Complete  PCP or Specialist Appt within 3-5 Days Complete   Home Care Screening  Complete  Medication Review (RN CM)  Complete  HRI or Home Care Consult Complete   Social Work Consult for  Recovery Care Planning/Counseling Complete   Palliative Care Screening Not Applicable   Medication Review Oceanographer) Referral to Pharmacy

## 2023-10-16 NOTE — Procedures (Signed)
 Received patient in bed to unit.  Alert and oriented.  Informed consent signed and in chart.   TX duration: 3.5 hours   Patient tolerated well.  Transported back to the room  Alert, without acute distress.  Hand-off given to patient's nurse.   Access used: right cath Access issues: none  Total UF removed: 3.5 liters   Clover Dao, RN Kidney Dialysis Unit

## 2023-10-16 NOTE — Progress Notes (Signed)
 PT Cancellation Note  Patient Details Name: Calvin Schultz MRN: [id number] DOB: 1951-03-31   Cancelled Treatment:    Reason Eval/Treat Not Completed: Patient at procedure or test/unavailable (HD)  Currently in HD;  Will follow up later today as time allows;  Otherwise, will follow up for PT on Monday -- consider taking pt to HD on a later round on Monday to allow for PT prior;   Thank you,  Darcus Eastern, PT  Acute Rehabilitation Services Office [phone]    Marcial Setting 10/16/2023, 8:05 AM

## 2023-10-16 NOTE — Plan of Care (Signed)
  Problem: Health Behavior/Discharge Planning: Goal: Ability to identify and utilize available resources and services will improve Outcome: Adequate for Discharge Goal: Ability to manage health-related needs will improve Outcome: Adequate for Discharge   Problem: Nutritional: Goal: Progress toward achieving an optimal weight will improve Outcome: Adequate for Discharge   Problem: Skin Integrity: Goal: Risk for impaired skin integrity will decrease Outcome: Adequate for Discharge   Problem: Tissue Perfusion: Goal: Adequacy of tissue perfusion will improve Outcome: Adequate for Discharge   Problem: Education: Goal: Knowledge of General Education information will improve Description: Including pain rating scale, medication(s)/side effects and non-pharmacologic comfort measures Outcome: Adequate for Discharge   Problem: Health Behavior/Discharge Planning: Goal: Ability to manage health-related needs will improve Outcome: Adequate for Discharge   Problem: Clinical Measurements: Goal: Ability to maintain clinical measurements within normal limits will improve Outcome: Adequate for Discharge Goal: Will remain free from infection Outcome: Adequate for Discharge Goal: Diagnostic test results will improve Outcome: Adequate for Discharge Goal: Respiratory complications will improve Outcome: Adequate for Discharge Goal: Cardiovascular complication will be avoided Outcome: Adequate for Discharge   Problem: Activity: Goal: Risk for activity intolerance will decrease Outcome: Adequate for Discharge   Problem: Nutrition: Goal: Adequate nutrition will be maintained Outcome: Adequate for Discharge   Problem: Coping: Goal: Level of anxiety will decrease Outcome: Adequate for Discharge   Problem: Elimination: Goal: Will not experience complications related to bowel motility Outcome: Adequate for Discharge Goal: Will not experience complications related to urinary retention Outcome:  Adequate for Discharge   Problem: Pain Managment: Goal: General experience of comfort will improve and/or be controlled Outcome: Adequate for Discharge   Problem: Safety: Goal: Ability to remain free from injury will improve Outcome: Adequate for Discharge   Problem: Skin Integrity: Goal: Risk for impaired skin integrity will decrease Outcome: Adequate for Discharge   Problem: Health Behavior/Discharge Planning: Goal: Ability to manage health-related needs will improve Outcome: Adequate for Discharge   Problem: Clinical Measurements: Goal: Complications related to the disease process or treatment will be avoided or minimized Outcome: Adequate for Discharge Goal: Dialysis access will remain free of complications Outcome: Adequate for Discharge   Problem: Activity: Goal: Activity intolerance will improve Outcome: Adequate for Discharge   Problem: Fluid Volume: Goal: Fluid volume balance will be maintained or improved Outcome: Adequate for Discharge   Problem: Nutritional: Goal: Ability to make appropriate dietary choices will improve Outcome: Adequate for Discharge   Problem: Respiratory: Goal: Respiratory symptoms related to disease process will be avoided Outcome: Adequate for Discharge   Problem: Self-Concept: Goal: Body image disturbance will be avoided or minimized Outcome: Adequate for Discharge   Problem: Urinary Elimination: Goal: Progression of disease will be identified and treated Outcome: Adequate for Discharge

## 2023-10-16 NOTE — Progress Notes (Signed)
 Patient ID: Calvin Schultz, male   DOB: [birth year], 73 y.o.   MRN: [id number] [facility] Progress Note   Assessment/ Plan:    1.  Anasarca/volume overload:  - Secondary to acute exacerbation of diastolic heart failure with limited success/response to diuretics in the setting of declining renal function prompting need to initiate chronic hemodialysis - optimize volume status with HD - Lasix  80 mg PO once a day on non-HD days  2. ESRD: With acute kidney injury on chronic kidney disease stage V and recurrent volume overload.  Now progressed to ESRD.  Started on hemodialysis (first treatment 10/10/2023) after placement of RIJ TDC and LUA AVF (1st stage BBF) on 10/09/2023 by Dr. Susi Eric.   ------------------------- - HD per MWF schedule.  - He has been accepted to [facility] on MWF schedule.  Try 103 kg as EDW or use his post weight from 6/13 if this is lower.  Would continue to challenge this weight as tolerated - he will need to follow-up with [facility] in 6 weeks per their note  3. Anemia of CKD: Secondary to chronic illness including chronic kidney disease,  - Increased aranesp  to 150 mcg every Saturday while here  - Iron  stores adequate.  4. CKD-MBD: Calcium  level acceptable with hyperphos improved for now on current regimen (HD; not on binder)  5. Nutrition: changed to renal diabetic diet with fluid restriction; now on dialysis.  6. Hypertension: controlled; avoid hypotension.  Note metoprolol  was added for PVC's/NSVT per primary team  Disposition - disposition per primary team     Subjective:    He had 650 mL UOP over 6/12 and one unmeasured urine void.  Seen and examined on dialysis.  Blood pressure 106/65 and HR 66.  RIJ tunneled catheter in use.  Tolerating goal.  Procedure supervised.  Still waiting on authorization for SNF.  He feels better and the left arm swelling has continued to improve.    Review of systems:      Denies shortness of breath or chest pain  Denies n/v      Objective:   BP 112/62   Pulse 66   Temp 97.8 F (36.6 C)   Resp 14   Ht 5' 10 (1.778 m)   Wt 103.2 kg   SpO2 100%   BMI 32.65 kg/m   Physical Exam:     General adult male in bed in no acute distress HEENT normocephalic atraumatic extraocular movements intact sclera anicteric Neck supple trachea midline Lungs clear to auscultation bilaterally normal work of breathing at rest on room air  Heart S1S2 no rub Abdomen soft nontender nondistended Extremities 1-2+ edema lower extremities Psych normal mood and affect Access RIJ tunneled catheter in place and LUE AVF with bruit and thrill; some bruising and trace/1+ edema - pt states both better   Labs: BMET Recent Labs  Lab 10/09/23 1629 10/10/23 0830 10/13/23 0051 10/13/23 0506 10/14/23 0830 10/15/23 0710 10/16/23 0619  NA 140 137 137 137 135 134* 138  K 4.3 3.7 3.6 3.5 3.4* 3.3* 3.4*  CL 98 98 98 101 97* 97* 98  CO2  --  25 27 25 26 28 26   GLUCOSE 105* 215* 127* 107* 118* 100* 114*  BUN 105* 90* 50* 51* 55* 35* 42*  CREATININE 4.80* 4.74* 3.50* 3.49* 4.02* 3.28* 3.74*  CALCIUM   --  8.8* 8.2* 8.2* 8.3* 8.2* 8.8*  PHOS  --  6.4*  --  4.4 5.0* 3.9 4.4   CBC Recent Labs  Lab  10/12/23 0547 10/14/23 0830 10/15/23 0710 10/16/23 0619  WBC 11.4* 12.6* 11.2* 10.9*  HGB 8.8* 8.4* 8.4* 8.7*  HCT 28.9* 28.4* 28.4* 29.0*  MCV 90.3 89.9 89.9 89.8  PLT 215 197 181 175      Medications:     Chlorhexidine  Gluconate Cloth  6 each Topical Q0600   Chlorhexidine  Gluconate Cloth  6 each Topical Q0600   Chlorhexidine  Gluconate Cloth  6 each Topical Q0600   [START ON 10/17/2023] darbepoetin (ARANESP ) injection - NON-DIALYSIS  150 mcg Subcutaneous Q Sat-1800   furosemide   80 mg Oral Q T,Th,S,Su   heparin  injection (subcutaneous)  5,000 Units Subcutaneous Q8H   insulin  aspart  0-9 Units Subcutaneous TID WC   pantoprazole   40 mg Oral BID   tamsulosin   0.4 mg Oral QPC supper   Nan Aver, MD 10/16/2023, 11:01  AM

## 2023-10-17 NOTE — Discharge Planning (Signed)
 [facility]  Initial Hemodialysis Orders  Dialysis center: [facility]  Patient's name: Calvin Schultz DOB: 02-Aug-1950 AKI or ESRD: ESRD  Discharge diagnosis: Anasarca/volume overload 2. ESRD  Allergies: No Known Allergies  Date of First Dialysis: 10/10/23 Cause of renal disease: HTN  Dialysis Prescription: Dialysis Frequency: TIW Tx duration: 4 hours BFR: 400 DFR: 800 EDW: 99kg  Dialyzer: 180NRe UF profile/Sodium modeling?: no Dialysis Bath: 3 K 2 Ca  Dialysis access: Access type: [facility] Date placed: 10/09/23 Surgeon: Dr. Susi Eric Also had first stage AVF same date- maturing  In Center Medications: Heparin  Dose: none  Type: N/A VDRA: none Venofer : none Mircera: 200mcg IV q 2 weeks Next dose due: next HD Sensipar: none  Discharge labs: Hgb: 8.7 K+: 3.4  Ca: 8.8  Phos: 4.4 Alb: 2.5  Please draw routine labs. Additional labs needed: Weekly potassium levels while on 3K bath Additional notes/follow-up: Discharged to SNF  Ramona Burner, PA-C 10/17/2023, 10:07 AM  [facility] Pager: [phone]

## 2023-10-23 ENCOUNTER — Other Ambulatory Visit: Payer: Self-pay | Admitting: Internal Medicine

## 2023-10-23 MED ORDER — FUROSEMIDE 80 MG PO TABS: 80.0000 mg | ORAL_TABLET | ORAL | 0 refills | Status: DC

## 2023-11-04 NOTE — Telephone Encounter (Signed)
Fine to send

## 2023-11-19 ENCOUNTER — Other Ambulatory Visit: Payer: Self-pay | Admitting: Vascular Surgery

## 2023-11-19 DIAGNOSIS — N186 End stage renal disease: Secondary | ICD-10-CM

## 2023-11-20 ENCOUNTER — Encounter (HOSPITAL_COMMUNITY)
Admission: RE | Disposition: A | Discharge status: Home / Self Care | Payer: Self-pay | Source: Home / Self Care | Attending: Vascular Surgery

## 2023-11-20 ENCOUNTER — Other Ambulatory Visit: Payer: Self-pay

## 2023-11-20 ENCOUNTER — Ambulatory Visit (HOSPITAL_COMMUNITY)
Admission: RE | Admit: 2023-11-20 | Discharge: 2023-11-20 | Disposition: A | Discharge status: Home / Self Care | Attending: Vascular Surgery | Admitting: Vascular Surgery

## 2023-11-20 DIAGNOSIS — Z992 Dependence on renal dialysis: Secondary | ICD-10-CM

## 2023-11-20 DIAGNOSIS — N186 End stage renal disease: Secondary | ICD-10-CM | POA: Diagnosis not present

## 2023-11-20 DIAGNOSIS — I120 Hypertensive chronic kidney disease with stage 5 chronic kidney disease or end stage renal disease: Secondary | ICD-10-CM | POA: Insufficient documentation

## 2023-11-20 DIAGNOSIS — T8249XA Other complication of vascular dialysis catheter, initial encounter: Secondary | ICD-10-CM

## 2023-11-20 DIAGNOSIS — E1122 Type 2 diabetes mellitus with diabetic chronic kidney disease: Secondary | ICD-10-CM | POA: Insufficient documentation

## 2023-11-20 DIAGNOSIS — T8241XA Breakdown (mechanical) of vascular dialysis catheter, initial encounter: Secondary | ICD-10-CM | POA: Insufficient documentation

## 2023-11-20 DIAGNOSIS — Y839 Surgical procedure, unspecified as the cause of abnormal reaction of the patient, or of later complication, without mention of misadventure at the time of the procedure: Secondary | ICD-10-CM | POA: Diagnosis not present

## 2023-11-20 HISTORY — PX: TUNNELLED CATHETER EXCHANGE: CATH118373

## 2023-11-20 LAB — GLUCOSE, CAPILLARY: Glucose-Capillary: 98 mg/dL (ref 70–99)

## 2023-11-20 SURGERY — TUNNELLED CATHETER EXCHANGE
Anesthesia: LOCAL

## 2023-11-20 MED ORDER — HEPARIN (PORCINE) IN NACL 1000-0.9 UT/500ML-% IV SOLN
INTRAVENOUS | Status: DC | PRN
  Administered 2023-11-20: 500 mL

## 2023-11-20 MED ORDER — HEPARIN SODIUM (PORCINE) 1000 UNIT/ML IJ SOLN
INTRAMUSCULAR | Status: DC | PRN
  Administered 2023-11-20: 3200 [IU] via INTRAVENOUS

## 2023-11-20 MED ORDER — LIDOCAINE HCL (PF) 1 % IJ SOLN
INTRAMUSCULAR | Status: AC
  Filled 2023-11-20: qty 30

## 2023-11-20 MED ORDER — HEPARIN SODIUM (PORCINE) 1000 UNIT/ML IJ SOLN
INTRAMUSCULAR | Status: AC
  Filled 2023-11-20: qty 10

## 2023-11-20 MED ORDER — LIDOCAINE HCL (PF) 1 % IJ SOLN
INTRAMUSCULAR | Status: DC | PRN
  Administered 2023-11-20: 15 mL via INTRADERMAL

## 2023-11-20 SURGICAL SUPPLY — 5 items
CATH PALINDROME-P 19CM W/VT (CATHETERS) IMPLANT
GLIDEWIRE STIFF .35X180X3 HYDR (WIRE) IMPLANT
KIT PV (KITS) ×1 IMPLANT
MAT PREVALON FULL STRYKER (MISCELLANEOUS) IMPLANT
TRAY PV CATH (CUSTOM PROCEDURE TRAY) ×1 IMPLANT

## 2023-11-20 NOTE — Op Note (Signed)
    Patient name: Calvin Schultz MRN: [id number] DOB: [birth year] Sex: male  11/20/2023 Pre-operative Diagnosis: End-stage renal disease, malfunction right IJ tunneled dialysis catheter Post-operative diagnosis:  Same Surgeon:  Penne BROCKS. Sheree, MD Procedure Performed:  Exchange of right IJ tunneled dialysis catheter with 19 cm catheter using fluoroscopic guidance  Indications: 73 year old male with history of end-stage renal disease on dialysis via right IJ catheter which is having difficulties.  He has recent creation of left for stage basilic vein fistula which has not matured and will need revision.  He now indicated for tunneled dialysis catheter exchange.  Findings: New catheter was placed via the same tunnel over a wire to the SVC atrial junction it did flush and withdraw easily at completion.   Procedure:  The patient was identified in the holding area and taken to the heart and vascular procedure room.  The patient was then placed supine on the table and prepped and draped in the usual sterile fashion.  A time out was called.  The area around the catheter was anesthetized 1% lidocaine  had dissected free the cuff until the catheter was mobile.  I then transected the catheter and clamped it.  A Glidewire was placed into the IVC under fluoroscopic guidance.  The catheter was removed over the Glidewire.  A new catheter was placed over the Glidewire to the SVC atrial junction and the wire was removed.  The catheter did flush and withdraw easily.  The catheter was affixed to the skin with 3-0 nylon suture and locked with concentrated heparin  1.6 cc allotted for.  Sterile dressing was applied.  He tolerated the procedure without any complication.   Evelyne Makepeace C. Sheree, MD [facility] Office: [phone] Pager: [phone]

## 2023-11-20 NOTE — H&P (Addendum)
 H+P  History of Present Illness: This is a 73 y.o. male with history of end-stage renal disease.  He recently underwent placement of right IJ tunneled dialysis catheter as well as left for stage basilic vein AV fistula creation.  He has plans for follow-up in our office on August 1.  They are having difficulty dialyzing through the catheter he is here today for exchange.  Past Medical History:  Diagnosis Date   Arthritis    Atrial fibrillation (HCC)    CKD (chronic kidney disease) stage 3, GFR 30-59 ml/min (HCC)    Diabetes mellitus without complication (HCC)    Dysrhythmia    Hyperlipidemia    Hypertension     Past Surgical History:  Procedure Laterality Date   AV FISTULA PLACEMENT Left 10/09/2023   Procedure: ARTERIOVENOUS (AV) FISTULA CREATION, FIRST STAGE BASILIC;  Surgeon: Pearline Norman RAMAN, MD;  Location: [facility] OR;  Service: Vascular;  Laterality: Left;  LEFT ARM AVF VERSUS AVG   CYSTOSCOPY W/ URETERAL STENT PLACEMENT Right 08/05/2023   Procedure: CYSTOSCOPY, WITH RETROGRADE PYELOGRAM AND URETERAL STENT INSERTION;  Surgeon: Roseann Adine PARAS., MD;  Location: [facility] OR;  Service: Urology;  Laterality: Right;   CYSTOSCOPY/URETEROSCOPY/HOLMIUM LASER/STENT PLACEMENT Left 07/24/2023   Procedure: CYSTOSCOPY/URETEROSCOPY/HOLMIUM LASER/ STENT PLACEMENT/REMOVAL OF NEPHROSTOMY TUBE;  Surgeon: Nieves Cough, MD;  Location: [facility] ORS;  Service: Urology;  Laterality: Left;  90 MINUTE CASE   CYSTOSCOPY/URETEROSCOPY/HOLMIUM LASER/STENT PLACEMENT Bilateral 08/18/2023   Procedure: CYSTOSCOPY/URETEROSCOPY/HOLMIUM LASER/STENT PLACEMENT;  Surgeon: Nieves Cough, MD;  Location: [facility] OR;  Service: Urology;  Laterality: Bilateral;   INSERTION OF DIALYSIS CATHETER Right 10/09/2023   Procedure: INSERTION OF DIALYSIS CATHETER, USING PALINDROME CATHETER KIT 19CM;  Surgeon: Pearline Norman RAMAN, MD;  Location: [facility] OR;  Service: Vascular;  Laterality: Right;  TUNNELED DIALYSIS CATHETER   IR NEPHROSTOMY PLACEMENT LEFT   06/03/2023   KIDNEY SURGERY      No Known Allergies  Prior to Admission medications   Medication Sig Start Date End Date Taking? Authorizing Provider  acetaminophen  (TYLENOL ) 325 MG tablet Take 2 tablets (650 mg total) by mouth every 4 (four) hours as needed for mild pain (pain score 1-3) or fever. 08/05/23   Angiulli, Toribio PARAS, PA-C  furosemide  (LASIX ) 80 MG tablet Take 1 tablet (80 mg total) by mouth See admin instructions. Take 1 tablet (80 mg) by mouth every Tuesday, Thursday, Saturday, and Sunday 10/23/23 10/22/24  Jurline Pee L, NP  iron  polysaccharides (NIFEREX) 150 MG capsule Take 1 capsule (150 mg total) by mouth daily. 07/11/23   Vann, Jessica U, DO  multivitamin (RENA-VIT) TABS tablet Take 1 tablet by mouth at bedtime. 08/05/23   Angiulli, Daniel J, PA-C  Nystatin (GERHARDT'S BUTT CREAM) CREA Apply 1 Application topically 2 (two) times daily. 08/20/23   Angiulli, Toribio PARAS, PA-C  pantoprazole  (PROTONIX ) 40 MG tablet Take 1 tablet (40 mg total) by mouth daily. 10/16/23   Tobie Yetta HERO, MD  psyllium (HYDROCIL/METAMUCIL) 95 % PACK Take 1 packet by mouth 2 (two) times daily. 08/05/23   Angiulli, Toribio PARAS, PA-C  tamsulosin  (FLOMAX ) 0.4 MG CAPS capsule Take 1 capsule (0.4 mg total) by mouth daily after supper. 10/16/23   Tobie Yetta HERO, MD  tiZANidine  (ZANAFLEX ) 4 MG tablet Take 1 tablet (4 mg total) by mouth 2 (two) times daily as needed for muscle spasms. 08/05/23   Angiulli, Toribio PARAS, PA-C  traZODone  (DESYREL ) 50 MG tablet Take 1 tablet (50 mg total) by mouth at bedtime as needed for sleep. 10/16/23  Tobie Yetta HERO, MD    Social History   Socioeconomic History   Marital status: Married    Spouse name: Not on file   Number of children: 1   Years of education: Not on file   Highest education level: Not on file  Occupational History   Not on file  Tobacco Use   Smoking status: Never   Smokeless tobacco: Never  Vaping Use   Vaping status: Never Used  Substance and Sexual Activity   Alcohol   use: Never   Drug use: Never   Sexual activity: Not Currently  Other Topics Concern   Not on file  Social History Narrative   Not on file   Social Drivers of Health   Financial Resource Strain: Not on file  Food Insecurity: No Food Insecurity (10/02/2023)   Hunger Vital Sign    Worried About Running Out of Food in the Last Year: Never true    Ran Out of Food in the Last Year: Never true  Transportation Needs: No Transportation Needs (10/02/2023)   PRAPARE - Administrator, Civil Service (Medical): No    Lack of Transportation (Non-Medical): No  Physical Activity: Not on file  Stress: Not on file  Social Connections: Moderately Isolated (10/02/2023)   Social Connection and Isolation Panel    Frequency of Communication with Friends and Family: More than three times a week    Frequency of Social Gatherings with Friends and Family: More than three times a week    Attends Religious Services: Never    Database administrator or Organizations: No    Attends Banker Meetings: Never    Marital Status: Married  Catering manager Violence: Not At Risk (10/02/2023)   Humiliation, Afraid, Rape, and Kick questionnaire    Fear of Current or Ex-Partner: No    Emotionally Abused: No    Physically Abused: No    Sexually Abused: No     Family History  Problem Relation Age of Onset   Dementia Mother     ROS: Difficulty dialyzing via catheter  Physical Examination  Vitals:   11/20/23 1017 11/20/23 1022  BP: 124/75 (!) 157/68  Pulse: 76 71  Resp:    Temp:    SpO2: 99% 97%      Awake alert oriented On the respirations Right IJ catheter in place without evidence of infection Left upper extremity incision clean dry intact with small hematoma from recent fistula creation  CBC    Component Value Date/Time   WBC 10.9 (H) 10/16/2023 0619   RBC 3.23 (L) 10/16/2023 0619   HGB 8.7 (L) 10/16/2023 0619   HGB 13.2 06/25/2021 1539   HCT 29.0 (L) 10/16/2023 0619    HCT 41.2 06/25/2021 1539   PLT 175 10/16/2023 0619   PLT 367 06/25/2021 1539   MCV 89.8 10/16/2023 0619   MCV 84 06/25/2021 1539   MCH 26.9 10/16/2023 0619   MCHC 30.0 10/16/2023 0619   RDW 17.2 (H) 10/16/2023 0619   RDW 12.6 06/25/2021 1539   LYMPHSABS 0.8 10/02/2023 1142   LYMPHSABS 2.4 06/25/2021 1539   MONOABS 1.2 (H) 10/02/2023 1142   EOSABS 0.2 10/02/2023 1142   EOSABS 0.2 06/25/2021 1539   BASOSABS 0.1 10/02/2023 1142   BASOSABS 0.1 06/25/2021 1539    BMET    Component Value Date/Time   NA 138 10/16/2023 0619   NA 139 06/25/2021 1539   K 3.4 (L) 10/16/2023 0619   CL 98 10/16/2023 9380  CO2 26 10/16/2023 0619   GLUCOSE 114 (H) 10/16/2023 0619   BUN 42 (H) 10/16/2023 0619   BUN 22 06/25/2021 1539   CREATININE 3.74 (H) 10/16/2023 0619   CALCIUM  8.8 (L) 10/16/2023 0619   GFRNONAA 17 (L) 10/16/2023 0619    COAGS: Lab Results  Component Value Date   INR 1.2 10/02/2023   INR 1.1 06/03/2023    ASSESSMENT/PLAN: This is a 73 y.o. male status post placement of right IJ tunneled catheter and creation of basilic vein fistula on the left.  Unfortunately catheter has malfunction and is in need of replacement.  We discussed the risk benefits and alternatives he demonstrates good understanding consent was signed.  Kenrick Pore C. Sheree, MD [facility] Office: [phone] Pager: [phone]

## 2023-11-21 ENCOUNTER — Encounter (HOSPITAL_COMMUNITY): Payer: Self-pay | Admitting: Vascular Surgery

## 2023-11-24 MED FILL — Lidocaine HCl Local Preservative Free (PF) Inj 1%: INTRAMUSCULAR | Qty: 30 | Status: AC

## 2023-11-27 ENCOUNTER — Ambulatory Visit: Admitting: Internal Medicine

## 2023-12-03 NOTE — Telephone Encounter (Signed)
 He was sent the [facility],  this is not needed

## 2023-12-04 ENCOUNTER — Encounter

## 2023-12-04 ENCOUNTER — Encounter (HOSPITAL_COMMUNITY)

## 2023-12-16 NOTE — Progress Notes (Deleted)
  POST OPERATIVE OFFICE NOTE    CC:  F/u for surgery  HPI:  This is a 73 y.o. male who is s/p left 1st stage BVT on 10/09/2023  by Dr. Pearline.  He underwent exchange of his right internal jugular TDC on 11/20/2023 by Dr. Sheree.  Pt states he does *** have pain/numbness in the *** hand.    The pt *** on dialysis *** at *** location.   No Known Allergies  Current Outpatient Medications  Medication Sig Dispense Refill   acetaminophen  (TYLENOL ) 325 MG tablet Take 2 tablets (650 mg total) by mouth every 4 (four) hours as needed for mild pain (pain score 1-3) or fever.     furosemide  (LASIX ) 80 MG tablet Take 1 tablet (80 mg total) by mouth See admin instructions. Take 1 tablet (80 mg) by mouth every Tuesday, Thursday, Saturday, and Sunday 30 tablet 0   iron  polysaccharides (NIFEREX) 150 MG capsule Take 1 capsule (150 mg total) by mouth daily.     multivitamin (RENA-VIT) TABS tablet Take 1 tablet by mouth at bedtime.     Nystatin (GERHARDT'S BUTT CREAM) CREA Apply 1 Application topically 2 (two) times daily. 60 each 0   pantoprazole  (PROTONIX ) 40 MG tablet Take 1 tablet (40 mg total) by mouth daily. 30 tablet 0   psyllium (HYDROCIL/METAMUCIL) 95 % PACK Take 1 packet by mouth 2 (two) times daily.     tamsulosin  (FLOMAX ) 0.4 MG CAPS capsule Take 1 capsule (0.4 mg total) by mouth daily after supper. 30 capsule 0   tiZANidine  (ZANAFLEX ) 4 MG tablet Take 1 tablet (4 mg total) by mouth 2 (two) times daily as needed for muscle spasms.     traZODone  (DESYREL ) 50 MG tablet Take 1 tablet (50 mg total) by mouth at bedtime as needed for sleep. 10 tablet 0   No current facility-administered medications for this visit.     ROS:  See HPI  Physical Exam:  ***  Incision:  *** Extremities:   There *** a palpable *** pulse.   Motor and sensory *** in tact.   There *** a thrill/bruit present.  Access is *** easily palpable   Dialysis Duplex on 12/18/2023: ***   Assessment/Plan:  This is a 73 y.o.  male who is s/p:  left 1st stage BVT on 10/09/2023  by Dr. Pearline.  He underwent exchange of his right internal jugular TDC on 11/20/2023 by Dr. Sheree.  -the pt does *** have evidence of steal. -pt's access can be used ***. -if pt has tunneled catheter, this can be removed at the discretion of the dialysis center once the pt's access has been successfully cannulated to their satisfaction.  -discussed with pt that access does not last forever and will need intervention or even new access at some point.  -the pt will follow up ***   Lucie Apt, [facility] [phone]  Clinic MD:  Sheree on call MD

## 2023-12-18 ENCOUNTER — Ambulatory Visit

## 2023-12-18 ENCOUNTER — Ambulatory Visit (HOSPITAL_COMMUNITY)

## 2023-12-31 ENCOUNTER — Ambulatory Visit (HOSPITAL_BASED_OUTPATIENT_CLINIC_OR_DEPARTMENT_OTHER): Admitting: General Surgery

## 2024-01-14 ENCOUNTER — Other Ambulatory Visit (HOSPITAL_COMMUNITY): Payer: Self-pay | Admitting: Student

## 2024-01-14 ENCOUNTER — Ambulatory Visit (HOSPITAL_COMMUNITY)
Admission: RE | Admit: 2024-01-14 | Discharge: 2024-01-14 | Disposition: A | Discharge status: Home / Self Care | Source: Ambulatory Visit | Attending: Vascular Surgery | Admitting: Vascular Surgery

## 2024-01-14 ENCOUNTER — Inpatient Hospital Stay (HOSPITAL_COMMUNITY)
Admission: EM | Admit: 2024-01-14 | Discharge: 2024-01-23 | DRG: 871 | Disposition: A | Discharge status: Home Health | Attending: Internal Medicine | Admitting: Internal Medicine

## 2024-01-14 ENCOUNTER — Emergency Department (HOSPITAL_COMMUNITY)

## 2024-01-14 ENCOUNTER — Other Ambulatory Visit: Payer: Self-pay

## 2024-01-14 ENCOUNTER — Inpatient Hospital Stay (HOSPITAL_COMMUNITY)
Admission: RE | Admit: 2024-01-14 | Discharge: 2024-01-14 | Discharge status: Home / Self Care | Source: Ambulatory Visit | Attending: Vascular Surgery

## 2024-01-14 ENCOUNTER — Encounter (HOSPITAL_COMMUNITY): Payer: Self-pay | Admitting: Emergency Medicine

## 2024-01-14 DIAGNOSIS — L89616 Pressure-induced deep tissue damage of right heel: Secondary | ICD-10-CM | POA: Diagnosis present

## 2024-01-14 DIAGNOSIS — A4101 Sepsis due to Methicillin susceptible Staphylococcus aureus: Principal | ICD-10-CM | POA: Diagnosis present

## 2024-01-14 DIAGNOSIS — B9561 Methicillin susceptible Staphylococcus aureus infection as the cause of diseases classified elsewhere: Secondary | ICD-10-CM | POA: Insufficient documentation

## 2024-01-14 DIAGNOSIS — Z1152 Encounter for screening for COVID-19: Secondary | ICD-10-CM

## 2024-01-14 DIAGNOSIS — L89152 Pressure ulcer of sacral region, stage 2: Secondary | ICD-10-CM | POA: Diagnosis present

## 2024-01-14 DIAGNOSIS — Z6834 Body mass index (BMI) 34.0-34.9, adult: Secondary | ICD-10-CM

## 2024-01-14 DIAGNOSIS — I132 Hypertensive heart and chronic kidney disease with heart failure and with stage 5 chronic kidney disease, or end stage renal disease: Secondary | ICD-10-CM | POA: Diagnosis present

## 2024-01-14 DIAGNOSIS — Z992 Dependence on renal dialysis: Secondary | ICD-10-CM

## 2024-01-14 DIAGNOSIS — Z794 Long term (current) use of insulin: Secondary | ICD-10-CM

## 2024-01-14 DIAGNOSIS — L97822 Non-pressure chronic ulcer of other part of left lower leg with fat layer exposed: Secondary | ICD-10-CM

## 2024-01-14 DIAGNOSIS — I5042 Chronic combined systolic (congestive) and diastolic (congestive) heart failure: Secondary | ICD-10-CM | POA: Diagnosis present

## 2024-01-14 DIAGNOSIS — L97812 Non-pressure chronic ulcer of other part of right lower leg with fat layer exposed: Secondary | ICD-10-CM

## 2024-01-14 DIAGNOSIS — N2581 Secondary hyperparathyroidism of renal origin: Secondary | ICD-10-CM | POA: Diagnosis present

## 2024-01-14 DIAGNOSIS — Z79899 Other long term (current) drug therapy: Secondary | ICD-10-CM

## 2024-01-14 DIAGNOSIS — I482 Chronic atrial fibrillation, unspecified: Secondary | ICD-10-CM | POA: Diagnosis present

## 2024-01-14 DIAGNOSIS — S81802A Unspecified open wound, left lower leg, initial encounter: Secondary | ICD-10-CM | POA: Diagnosis present

## 2024-01-14 DIAGNOSIS — L97219 Non-pressure chronic ulcer of right calf with unspecified severity: Secondary | ICD-10-CM | POA: Diagnosis present

## 2024-01-14 DIAGNOSIS — E875 Hyperkalemia: Secondary | ICD-10-CM | POA: Diagnosis present

## 2024-01-14 DIAGNOSIS — I480 Paroxysmal atrial fibrillation: Secondary | ICD-10-CM | POA: Diagnosis present

## 2024-01-14 DIAGNOSIS — Z79891 Long term (current) use of opiate analgesic: Secondary | ICD-10-CM

## 2024-01-14 DIAGNOSIS — E872 Acidosis, unspecified: Secondary | ICD-10-CM | POA: Diagnosis present

## 2024-01-14 DIAGNOSIS — R6521 Severe sepsis with septic shock: Secondary | ICD-10-CM | POA: Diagnosis present

## 2024-01-14 DIAGNOSIS — E1122 Type 2 diabetes mellitus with diabetic chronic kidney disease: Secondary | ICD-10-CM | POA: Diagnosis present

## 2024-01-14 DIAGNOSIS — D631 Anemia in chronic kidney disease: Secondary | ICD-10-CM | POA: Diagnosis present

## 2024-01-14 DIAGNOSIS — E785 Hyperlipidemia, unspecified: Secondary | ICD-10-CM | POA: Diagnosis present

## 2024-01-14 DIAGNOSIS — E871 Hypo-osmolality and hyponatremia: Secondary | ICD-10-CM | POA: Diagnosis present

## 2024-01-14 DIAGNOSIS — Z713 Dietary counseling and surveillance: Secondary | ICD-10-CM

## 2024-01-14 DIAGNOSIS — R7881 Bacteremia: Secondary | ICD-10-CM | POA: Insufficient documentation

## 2024-01-14 DIAGNOSIS — A419 Sepsis, unspecified organism: Principal | ICD-10-CM | POA: Diagnosis present

## 2024-01-14 DIAGNOSIS — Z960 Presence of urogenital implants: Secondary | ICD-10-CM | POA: Diagnosis present

## 2024-01-14 DIAGNOSIS — G9341 Metabolic encephalopathy: Secondary | ICD-10-CM | POA: Diagnosis present

## 2024-01-14 DIAGNOSIS — N186 End stage renal disease: Secondary | ICD-10-CM | POA: Diagnosis present

## 2024-01-14 DIAGNOSIS — S81801A Unspecified open wound, right lower leg, initial encounter: Secondary | ICD-10-CM | POA: Diagnosis present

## 2024-01-14 DIAGNOSIS — E66811 Obesity, class 1: Secondary | ICD-10-CM | POA: Diagnosis present

## 2024-01-14 DIAGNOSIS — L89629 Pressure ulcer of left heel, unspecified stage: Secondary | ICD-10-CM | POA: Diagnosis present

## 2024-01-14 DIAGNOSIS — N400 Enlarged prostate without lower urinary tract symptoms: Secondary | ICD-10-CM | POA: Diagnosis present

## 2024-01-14 LAB — CBC WITH DIFFERENTIAL/PLATELET
Basophils Absolute: 0 K/uL (ref 0.0–0.1)
Basophils Relative: 0 %
Eosinophils Absolute: 0 K/uL (ref 0.0–0.5)
Eosinophils Relative: 0 %
HCT: 26.1 % — ABNORMAL LOW (ref 39.0–52.0)
Hemoglobin: 8.1 g/dL — ABNORMAL LOW (ref 13.0–17.0)
Lymphocytes Relative: 1 %
Lymphs Abs: 0.5 K/uL — ABNORMAL LOW (ref 0.7–4.0)
MCH: 28.7 pg (ref 26.0–34.0)
MCHC: 31 g/dL (ref 30.0–36.0)
MCV: 92.6 fL (ref 80.0–100.0)
Monocytes Absolute: 2.4 K/uL — ABNORMAL HIGH (ref 0.1–1.0)
Monocytes Relative: 5 %
Neutro Abs: 44.6 K/uL — ABNORMAL HIGH (ref 1.7–7.7)
Neutrophils Relative %: 94 %
Platelets: 229 K/uL (ref 150–400)
RBC: 2.82 MIL/uL — ABNORMAL LOW (ref 4.22–5.81)
RDW: 16.6 % — ABNORMAL HIGH (ref 11.5–15.5)
WBC: 47.4 K/uL — ABNORMAL HIGH (ref 4.0–10.5)
nRBC: 0 % (ref 0.0–0.2)

## 2024-01-14 LAB — COMPREHENSIVE METABOLIC PANEL WITH GFR
ALT: 11 U/L (ref 0–44)
AST: 20 U/L (ref 15–41)
Albumin: 2.4 g/dL — ABNORMAL LOW (ref 3.5–5.0)
Alkaline Phosphatase: 105 U/L (ref 38–126)
Anion gap: 15 (ref 5–15)
BUN: 27 mg/dL — ABNORMAL HIGH (ref 8–23)
CO2: 23 mmol/L (ref 22–32)
Calcium: 8.7 mg/dL — ABNORMAL LOW (ref 8.9–10.3)
Chloride: 96 mmol/L — ABNORMAL LOW (ref 98–111)
Creatinine, Ser: 4.07 mg/dL — ABNORMAL HIGH (ref 0.61–1.24)
GFR, Estimated: 15 mL/min — ABNORMAL LOW (ref >60)
Glucose, Bld: 160 mg/dL — ABNORMAL HIGH (ref 70–99)
Potassium: 4.6 mmol/L (ref 3.5–5.1)
Sodium: 134 mmol/L — ABNORMAL LOW (ref 135–145)
Total Bilirubin: 1.7 mg/dL — ABNORMAL HIGH (ref 0.0–1.2)
Total Protein: 6.4 g/dL — ABNORMAL LOW (ref 6.5–8.1)

## 2024-01-14 LAB — PROTIME-INR
INR: 1.3 — ABNORMAL HIGH (ref 0.8–1.2)
Prothrombin Time: 16.8 s — ABNORMAL HIGH (ref 11.4–15.2)

## 2024-01-14 LAB — I-STAT CHEM 8, ED
BUN: 31 mg/dL — ABNORMAL HIGH (ref 8–23)
Calcium, Ion: 1.08 mmol/L — ABNORMAL LOW (ref 1.15–1.40)
Chloride: 96 mmol/L — ABNORMAL LOW (ref 98–111)
Creatinine, Ser: 4.1 mg/dL — ABNORMAL HIGH (ref 0.61–1.24)
Glucose, Bld: 160 mg/dL — ABNORMAL HIGH (ref 70–99)
HCT: 28 % — ABNORMAL LOW (ref 39.0–52.0)
Hemoglobin: 9.5 g/dL — ABNORMAL LOW (ref 13.0–17.0)
Potassium: 4.7 mmol/L (ref 3.5–5.1)
Sodium: 133 mmol/L — ABNORMAL LOW (ref 135–145)
TCO2: 24 mmol/L (ref 22–32)

## 2024-01-14 LAB — I-STAT CG4 LACTIC ACID, ED
Lactic Acid, Venous: 3.6 mmol/L (ref 0.5–1.9)
Lactic Acid, Venous: 3 mmol/L (ref 0.5–1.9)
Lactic Acid, Venous: 2.7 mmol/L (ref 0.5–1.9)

## 2024-01-14 LAB — RESP PANEL BY RT-PCR (RSV, FLU A&B, COVID)  RVPGX2
Influenza A by PCR: NEGATIVE
Influenza B by PCR: NEGATIVE
Resp Syncytial Virus by PCR: NEGATIVE
SARS Coronavirus 2 by RT PCR: NEGATIVE

## 2024-01-14 MED ORDER — LACTATED RINGERS IV BOLUS
500.0000 mL | Freq: Once | INTRAVENOUS | Status: AC
  Administered 2024-01-14: 500 mL via INTRAVENOUS

## 2024-01-14 MED ORDER — PIPERACILLIN-TAZOBACTAM 3.375 G IVPB
3.3750 g | Freq: Once | INTRAVENOUS | Status: AC
  Administered 2024-01-14: 3.375 g via INTRAVENOUS
  Filled 2024-01-14: qty 50

## 2024-01-14 MED ORDER — LACTATED RINGERS IV BOLUS
1000.0000 mL | Freq: Once | INTRAVENOUS | Status: DC
Start: 2024-01-14 — End: 2024-01-14

## 2024-01-14 MED ORDER — NOREPINEPHRINE 4 MG/250ML-% IV SOLN
0.0000 ug/min | INTRAVENOUS | Status: DC
  Administered 2024-01-14: 2 ug/min via INTRAVENOUS
  Filled 2024-01-14: qty 250

## 2024-01-14 MED ORDER — VANCOMYCIN HCL 2000 MG/400ML IV SOLN
2000.0000 mg | Freq: Once | INTRAVENOUS | Status: AC
  Administered 2024-01-14: 2000 mg via INTRAVENOUS
  Filled 2024-01-14: qty 400

## 2024-01-14 NOTE — ED Provider Notes (Signed)
 [facility] Provider Note   CSN: [id number] Arrival date & time: 01/14/24  1957     History  Chief Complaint  Patient presents with   Altered Mental Status   Fever    Calvin Schultz is a 73 y.o. male with DM2, HLD, HTN, paroxysmal A-fib, ESRD on hemodialysis who presents via [facility] from home for c/o AMS since yesterday afternoon.  Patient was recently admitted to the hospital from 10/01/2023 to 10/16/2023 for septic shock secondary to C. difficile colitis.  He had swelling and anasarca and was diuresed and also was initiated on hemodialysis for ESRD during this admission and treated for bilateral heel ulcers and has been seeing wound clinic in the outpatient setting.  Was most recently seen on 01/12/2024 by plastic surgery and noted to have foot and heel wounds with necrotic tissue in the base of the wounds and thick eschars; these were debrided with a curette to bleeding tissue and dressings were placed in the clinic.   Starting yesterday, wife noticed that patient began to become confused with how to perform his ADLs.  He was in his wheelchair and bumping into things which is very abnormal for him.  He has been very generally weak and unable to get around very well.  Also had fever at home.  Has not had any falls or trauma.  Patient denies any chest pain, shortness of breath, cough, abdominal pain, nausea vomiting diarrhea constipation.  Reports that his lower extremity wounds are always painful and he has difficulty sleeping -in fact he normally has to sleep in the recliner due to the pain.    Per EMS Vitals. Tem 99.1 oral, HR 100, BP 76/42. 650cc of LR given prior to arrival with improvement of his SBP into 90s mmHg. HD pt with last treatment yesterday.   On arrival patient with blood pressure   Past Medical History:  Diagnosis Date   Arthritis    Atrial fibrillation (HCC)    CKD (chronic kidney disease) stage 3, GFR 30-59 ml/min (HCC)    Diabetes  mellitus without complication (HCC)    Dysrhythmia    Hyperlipidemia    Hypertension        Home Medications Prior to Admission medications   Medication Sig Start Date End Date Taking? Authorizing Provider  acetaminophen  (TYLENOL ) 325 MG tablet Take 2 tablets (650 mg total) by mouth every 4 (four) hours as needed for mild pain (pain score 1-3) or fever. 08/05/23   Angiulli, Toribio PARAS, PA-C  furosemide  (LASIX ) 80 MG tablet Take 1 tablet (80 mg total) by mouth See admin instructions. Take 1 tablet (80 mg) by mouth every Tuesday, Thursday, Saturday, and Sunday 10/23/23 10/22/24  Jurline Pee L, NP  iron  polysaccharides (NIFEREX) 150 MG capsule Take 1 capsule (150 mg total) by mouth daily. 07/11/23   Vann, Jessica U, DO  multivitamin (RENA-VIT) TABS tablet Take 1 tablet by mouth at bedtime. 08/05/23   Angiulli, Daniel J, PA-C  Nystatin (GERHARDT'S BUTT CREAM) CREA Apply 1 Application topically 2 (two) times daily. 08/20/23   Angiulli, Toribio PARAS, PA-C  pantoprazole  (PROTONIX ) 40 MG tablet Take 1 tablet (40 mg total) by mouth daily. 10/16/23   Tobie Yetta HERO, MD  psyllium (HYDROCIL/METAMUCIL) 95 % PACK Take 1 packet by mouth 2 (two) times daily. 08/05/23   Angiulli, Toribio PARAS, PA-C  tamsulosin  (FLOMAX ) 0.4 MG CAPS capsule Take 1 capsule (0.4 mg total) by mouth daily after supper. 10/16/23   Patel, Pranav  M, MD  tiZANidine  (ZANAFLEX ) 4 MG tablet Take 1 tablet (4 mg total) by mouth 2 (two) times daily as needed for muscle spasms. 08/05/23   Angiulli, Toribio PARAS, PA-C  traZODone  (DESYREL ) 50 MG tablet Take 1 tablet (50 mg total) by mouth at bedtime as needed for sleep. 10/16/23   Tobie Yetta HERO, MD      Allergies    Patient has no known allergies.    Review of Systems   Review of Systems A 10 point review of systems was performed and is negative unless otherwise reported in HPI.  Physical Exam Updated Vital Signs BP 92/65   Pulse 95   Temp 100.3 F (37.9 C) (Oral)   Resp 19   Ht 5' 10 (1.778 m)   Wt 99 kg    SpO2 99%   BMI 31.32 kg/m  Physical Exam General: Acutely ill appearing male, lying in bed.  HEENT: PERRLA, Sclera anicteric, MMM, trachea midline.  Cardiology: RRR, no murmurs/rubs/gallops. R HD cath with no surrounding erythema/induration/TTP Resp: Normal respiratory rate and effort. CTAB, no wheezes, rhonchi, crackles.  Abd: Soft, non-tender, non-distended. No rebound tenderness or guarding.  GU: Deferred. MSK:+1 edema. Symmetrical. Post/lateral deep necrotic ulcer on the right leg. Multiple other smaller ulcers on both legs. Good peripheral pulses.  Skin: warm, dry. No rashes or lesions. Back: No CVA tenderness Neuro: Mildly sleepy but oriented x 4, CNs II-XII grossly intact. MAEs. Sensation grossly intact.  Psych: Normal mood and affect.   ED Results / Procedures / Treatments   Labs (all labs ordered are listed, but only abnormal results are displayed) Labs Reviewed  CBC WITH DIFFERENTIAL/PLATELET - Abnormal; Notable for the following components:      Result Value   WBC 47.4 (*)    RBC 2.82 (*)    Hemoglobin 8.1 (*)    HCT 26.1 (*)    RDW 16.6 (*)    Neutro Abs 44.6 (*)    Lymphs Abs 0.5 (*)    Monocytes Absolute 2.4 (*)    All other components within normal limits  PROTIME-INR - Abnormal; Notable for the following components:   Prothrombin Time 16.8 (*)    INR 1.3 (*)    All other components within normal limits  I-STAT CG4 LACTIC ACID, ED - Abnormal; Notable for the following components:   Lactic Acid, Venous 3.6 (*)    All other components within normal limits  I-STAT CHEM 8, ED - Abnormal; Notable for the following components:   Sodium 133 (*)    Chloride 96 (*)    BUN 31 (*)    Creatinine, Ser 4.10 (*)    Glucose, Bld 160 (*)    Calcium , Ion 1.08 (*)    Hemoglobin 9.5 (*)    HCT 28.0 (*)    All other components within normal limits  I-STAT CG4 LACTIC ACID, ED - Abnormal; Notable for the following components:   Lactic Acid, Venous 3.0 (*)    All other  components within normal limits  CULTURE, BLOOD (ROUTINE X 2)  CULTURE, BLOOD (ROUTINE X 2)  RESP PANEL BY RT-PCR (RSV, FLU A&B, COVID)  RVPGX2  COMPREHENSIVE METABOLIC PANEL WITH GFR  URINALYSIS, W/ REFLEX TO CULTURE (INFECTION SUSPECTED)    EKG None  Radiology VAS US  ABI WITH/WO TBI Result Date: 01/14/2024  LOWER EXTREMITY DOPPLER STUDY Patient Name:  Ovadia Lopp  Date of Exam:   01/14/2024 Medical Rec #: [id number]    Accession #:    [id number] Date of  Birth: [birth year]   Patient Gender: M Patient Age:   18 years Exam Location:  [address] Procedure:      VAS US  ABI WITH/WO TBI Referring Phys: JOSHUA DILLEY --------------------------------------------------------------------------------  Indications: Ulceration. High Risk Factors: Diabetes, no history of smoking.  Limitations: Today's exam was limited due to patient positioning, patient              intolerant to cuff pressure, an open wound, Unna boot in place,              involuntary patient movement and bandages. Performing Technologist: Edsel Mustard RVT  Examination Guidelines: A complete evaluation includes at minimum, Doppler waveform signals and systolic blood pressure reading at the level of bilateral brachial, anterior tibial, and posterior tibial arteries, when vessel segments are accessible. Bilateral testing is considered an integral part of a complete examination. Photoelectric Plethysmograph (PPG) waveforms and toe systolic pressure readings are included as required and additional duplex testing as needed. Limited examinations for reoccurring indications may be performed as noted.  ABI Findings: +---------+------------------+-----+---------+--------+ Right    Rt Pressure (mmHg)IndexWaveform Comment  +---------+------------------+-----+---------+--------+ Brachial 141                                      +---------+------------------+-----+---------+--------+ PTA                             triphasic          +---------+------------------+-----+---------+--------+ DP       254               1.80 triphasic         +---------+------------------+-----+---------+--------+ [facility]               0.79 Normal            +---------+------------------+-----+---------+--------+ +---------+------------------+-----+-----------+-------+ Left     Lt Pressure (mmHg)IndexWaveform   Comment +---------+------------------+-----+-----------+-------+ PTA      254               1.80 multiphasic        +---------+------------------+-----+-----------+-------+ DP       254               1.80 triphasic          +---------+------------------+-----+-----------+-------+ Great Toe111               0.79 Normal             +---------+------------------+-----+-----------+-------+ Arterial wall calcification precludes accurate ankle pressures and ABIs. Waveforms demonstrate brisk, triphasic flow.  Summary: Right: Resting right ankle-brachial index indicates noncompressible right lower extremity arteries. The right toe-brachial index is normal.  Left: Resting left ankle-brachial index indicates noncompressible left lower extremity arteries. The left toe-brachial index is normal.  *See table(s) above for measurements and observations.  Electronically signed by Gordy Bergamo MD on 01/14/2024 at 9:01:54 PM.    Final    VAS US  LOWER EXTREMITY VENOUS REFLUX Result Date: 01/14/2024  Lower Venous Reflux Study Patient Name:  Jaleel Allen  Date of Exam:   01/14/2024 Medical Rec #: [id number]    Accession #:    [id number] Date of Birth: 02/09/1951   Patient Gender: M Patient Age:   6 years Exam Location:  [address] Procedure:      VAS US  LOWER EXTREMITY VENOUS REFLUX Referring Phys: FONDA MORITA --------------------------------------------------------------------------------  Indications: Ulceration.  Patient had wounds that developed a few months ago from swelling and trauma. Significant edema.  Limitations: Bandages and  open wound. Performing Technologist: Edsel Mustard RVT  Examination Guidelines: A complete evaluation includes B-mode imaging, spectral Doppler, color Doppler, and power Doppler as needed of all accessible portions of each vessel. Bilateral testing is considered an integral part of a complete examination. Limited examinations for reoccurring indications may be performed as noted. The reflux portion of the exam is performed with the patient in reverse Trendelenburg. Significant venous reflux is defined as >500 ms in the superficial venous system, and >1 second in the deep venous system.  Venous Reflux Times +--------------+---------+------+-----------+------------+--------+ RIGHT         Reflux NoRefluxReflux TimeDiameter cmsComments                         Yes                                  +--------------+---------+------+-----------+------------+--------+ CFV           no                                             +--------------+---------+------+-----------+------------+--------+ FV mid        no                                             +--------------+---------+------+-----------+------------+--------+ Popliteal     no                                             +--------------+---------+------+-----------+------------+--------+ GSV at [facility]    no                            0.78             +--------------+---------+------+-----------+------------+--------+ GSV prox thighno                            0.55             +--------------+---------+------+-----------+------------+--------+ GSV mid thigh no                            0.50             +--------------+---------+------+-----------+------------+--------+ GSV dist thighno                            0.50             +--------------+---------+------+-----------+------------+--------+ GSV at knee   no                            0.40              +--------------+---------+------+-----------+------------+--------+ GSV prox calf no  0.39             +--------------+---------+------+-----------+------------+--------+ SSV at [facility]    no                            0.26             +--------------+---------+------+-----------+------------+--------+  +--------------+---------+------+-----------+------------+--------+ LEFT          Reflux NoRefluxReflux TimeDiameter cmsComments                         Yes                                  +--------------+---------+------+-----------+------------+--------+ CFV           no                                             +--------------+---------+------+-----------+------------+--------+ FV mid        no                                             +--------------+---------+------+-----------+------------+--------+ Popliteal     no                                             +--------------+---------+------+-----------+------------+--------+ GSV at [facility]    no                            0.72             +--------------+---------+------+-----------+------------+--------+ GSV prox thighno                            0.39             +--------------+---------+------+-----------+------------+--------+ GSV mid thigh no                            0.38             +--------------+---------+------+-----------+------------+--------+ GSV dist thighno                            0.36             +--------------+---------+------+-----------+------------+--------+ GSV at knee   no                            0.30             +--------------+---------+------+-----------+------------+--------+ GSV prox calf no                            0.17             +--------------+---------+------+-----------+------------+--------+ Unable to assess mid and distal calf due to open wounds and bandages.  Summary: Bilateral: Limited examination  due to patient pain, bandages and open wounds. -  No evidence of deep vein thrombosis seen in the lower extremities, bilaterally, from the common femoral through the popliteal veins. - No evidence of superficial venous thrombosis in the lower extremities, bilaterally. - No evidence of deep venous insufficiency seen bilaterally in the lower extremity. - No evidence of superficial venous reflux seen in the greater saphenous veins bilaterally. - No evidence of superficial venous reflux seen in the short saphenous veins bilaterally.  *See table(s) above for measurements and observations. Electronically signed by Gordy Bergamo MD on 01/14/2024 at 9:00:59 PM.    Final    DG Chest Port 1 View if patient is in a treatment room. Result Date: 01/14/2024 CLINICAL DATA:  Sepsis. EXAM: PORTABLE CHEST 1 VIEW COMPARISON:  Chest radiograph dated 10/13/2023. FINDINGS: Right sided dialysis catheter in similar position. There is shallow inspiration. There is cardiomegaly with vascular congestion. Bibasilar atelectasis or infiltrate. No pneumothorax. No acute osseous pathology. IMPRESSION: 1. Cardiomegaly with vascular congestion. 2. Bibasilar atelectasis or infiltrate. Electronically Signed   By: Vanetta Chou M.D.   On: 01/14/2024 20:35    Procedures .Critical Care  Performed by: Franklyn Sid SAILOR, MD Authorized by: Franklyn Sid SAILOR, MD   Critical care provider statement:    Critical care time (minutes):  60   Critical care was necessary to treat or prevent imminent or life-threatening deterioration of the following conditions:  Shock and sepsis   Critical care was time spent personally by me on the following activities:  Development of treatment plan with patient or surrogate, discussions with consultants, evaluation of patient's response to treatment, examination of patient, ordering and review of laboratory studies, ordering and review of radiographic studies, ordering and performing treatments and interventions, pulse  oximetry, re-evaluation of patient's condition, review of old charts and obtaining history from patient or surrogate   Care discussed with: admitting provider       Medications Ordered in ED Medications  norepinephrine  (LEVOPHED ) 4mg  in (0.016 mg/mL) premix infusion (0 mcg/min Intravenous Stopped 01/15/24 2002)  Chlorhexidine  Gluconate Cloth 2 % PADS 6 each (has no administration in time range)  lactated ringers  bolus 500 mL (0 mLs Intravenous Stopped 01/14/24 2242)  vancomycin  (VANCOREADY) IVPB 2000 mg/400 mL (0 mg Intravenous Stopped 01/15/24 0016)  piperacillin -tazobactam (ZOSYN ) IVPB 3.375 g (0 g Intravenous Stopped 01/14/24 2242)  lactated ringers  bolus 500 mL ( Intravenous Stopped 01/15/24 0421)    ED Course/ Medical Decision Making/ A&P                          Medical Decision Making Amount and/or Complexity of Data Reviewed Labs: ordered. Decision-making details documented in ED Course. Radiology: ordered. Decision-making details documented in ED Course.  Risk Prescription drug management. Decision regarding hospitalization.    This patient presents to the ED for concern of altered mental status, this involves an extensive number of treatment options, and is a complaint that carries with it a high risk of complications and morbidity.  I considered the following differential and admission for this acute, potentially life threatening condition.   MDM:    Patient is hypotensive in the ER in the 90s and 80s systolic.  I wonder if this is what made him altered yesterday if he was presyncopal during that time and became altered and disoriented.  Also consider metabolic encephalopathy possibly due to sepsis.  No significant electrolyte abnormalities or hyper/hypoglycemia. Patient with a very significant leukocytosis 47 and fever here.  Code sepsis called, obtained blood cultures,  initiated IV fluids as well as broad-spectrum IV antibiotics.  Lactic is elevated to 3.0 initially.   He has no clear source of infection except the wounds on his legs.  CT head is negative for any ICH or hydrocephalus. Received total of 1.6 L LR with improvement but BP recurrently low and started on Levophed  4 mcg/min.    Clinical Course as of 01/15/24 2322  Thu Jan 14, 2024  2107 WBC(!): 47.4 +leukocytosis [HN]  2125 DG Chest [place] 1 View if patient is in a treatment room. 1. Cardiomegaly with vascular congestion. 2. Bibasilar atelectasis or infiltrate.   [HN]  2125 Creatinine(!): 4.07 Known ESRD [HN]  2125 Lactic Acid, Venous(!!): 3.0 Receiving fluids [HN]  2255 CT Head Wo Contrast 1. No CT evidence for acute intracranial abnormality. 2. Patchy white matter hypodensity, probably chronic small vessel ischemic change.   [HN]  2349 Patient recurrently hypotensive into the 70s systolic.  This is despite a total of 1.6 L fluid resuscitation.  Patient does have EF 45%.  Will initiate another 500 cc fluid bolus while initiating Levophed .  Consulted to critical care. [HN]    Clinical Course User Index [HN] Franklyn Sid SAILOR, MD    Labs: I Ordered, and personally interpreted labs.  The pertinent results include: Those listed above  Imaging Studies ordered: I ordered imaging studies including CT head I independently visualized and interpreted imaging. I agree with the radiologist interpretation  Additional history obtained from chart review, wife at bedside.    Cardiac Monitoring: The patient was maintained on a cardiac monitor.  I personally viewed and interpreted the cardiac monitored which showed an underlying rhythm of: Sinus tachycardia  Reevaluation: After the interventions noted above, I reevaluated the patient and found that they have :improved  Social Determinants of Health:  lives at facility  Disposition: Admit to ICU  Co morbidities that complicate the patient evaluation  Past Medical History:  Diagnosis Date   Arthritis    Atrial fibrillation (HCC)    CKD  (chronic kidney disease) stage 3, GFR 30-59 ml/min (HCC)    Diabetes mellitus without complication (HCC)    Dysrhythmia    Hyperlipidemia    Hypertension      Medicines No orders of the defined types were placed in this encounter.   I have reviewed the patients home medicines and have made adjustments as needed  Problem List / ED Course: Problem List Items Addressed This Visit       Other   * (Principal) Septic shock (HCC) - Primary   Relevant Medications   ceFAZolin  (ANCEF ) IVPB 1 g/50 mL premix                This note was created using dictation software, which may contain spelling or grammatical errors.    Franklyn Sid SAILOR, MD 01/18/24 [id number]

## 2024-01-14 NOTE — Progress Notes (Signed)
 ED Pharmacy Antibiotic Sign Off An antibiotic consult was received from an ED provider for Zosyn  and Vancomycin  per pharmacy dosing for Sepsis. A chart review was completed to assess appropriateness.   The following one time order(s) were placed:  Vancomycin  2000 mg x1 Zosyn  3.375 mg x1  Further antibiotic and/or antibiotic pharmacy consults should be ordered by the admitting provider if indicated.   Thank you for allowing pharmacy to be a part of this patient's care.   Prentice DOROTHA Favors, PharmD PGY1 Health-System Pharmacy Administration and Leadership Resident [facility]  01/14/2024 9:40 PM

## 2024-01-14 NOTE — ED Triage Notes (Signed)
 Pt arrive by [facility] from home for c/o AMS since yesterday afternoon. Per family pt is been confuse of how to perform ADL or manage his wheel chair. Pt is reporting generalized weakness having some fever today. Pt has bilateral legs wounds and sacrum wound with multiple skin tears on his arms. Per EMS Vitals. Tem 99.1 oral, HR 100, BP 76/42. 250 NS and 400 LR given prior to arrival. HD pt with last treatment yesterday.

## 2024-01-15 ENCOUNTER — Inpatient Hospital Stay (HOSPITAL_COMMUNITY)

## 2024-01-15 DIAGNOSIS — B9561 Methicillin susceptible Staphylococcus aureus infection as the cause of diseases classified elsewhere: Secondary | ICD-10-CM | POA: Diagnosis not present

## 2024-01-15 DIAGNOSIS — I132 Hypertensive heart and chronic kidney disease with heart failure and with stage 5 chronic kidney disease, or end stage renal disease: Secondary | ICD-10-CM | POA: Diagnosis present

## 2024-01-15 DIAGNOSIS — Z992 Dependence on renal dialysis: Secondary | ICD-10-CM

## 2024-01-15 DIAGNOSIS — E1122 Type 2 diabetes mellitus with diabetic chronic kidney disease: Secondary | ICD-10-CM | POA: Diagnosis present

## 2024-01-15 DIAGNOSIS — Z1152 Encounter for screening for COVID-19: Secondary | ICD-10-CM | POA: Diagnosis not present

## 2024-01-15 DIAGNOSIS — I5042 Chronic combined systolic (congestive) and diastolic (congestive) heart failure: Secondary | ICD-10-CM | POA: Diagnosis present

## 2024-01-15 DIAGNOSIS — Z794 Long term (current) use of insulin: Secondary | ICD-10-CM | POA: Diagnosis not present

## 2024-01-15 DIAGNOSIS — L97929 Non-pressure chronic ulcer of unspecified part of left lower leg with unspecified severity: Secondary | ICD-10-CM | POA: Diagnosis not present

## 2024-01-15 DIAGNOSIS — A419 Sepsis, unspecified organism: Secondary | ICD-10-CM | POA: Diagnosis not present

## 2024-01-15 DIAGNOSIS — L97219 Non-pressure chronic ulcer of right calf with unspecified severity: Secondary | ICD-10-CM | POA: Diagnosis present

## 2024-01-15 DIAGNOSIS — N186 End stage renal disease: Secondary | ICD-10-CM | POA: Diagnosis present

## 2024-01-15 DIAGNOSIS — L89616 Pressure-induced deep tissue damage of right heel: Secondary | ICD-10-CM | POA: Diagnosis present

## 2024-01-15 DIAGNOSIS — A4101 Sepsis due to Methicillin susceptible Staphylococcus aureus: Secondary | ICD-10-CM | POA: Diagnosis present

## 2024-01-15 DIAGNOSIS — I482 Chronic atrial fibrillation, unspecified: Secondary | ICD-10-CM | POA: Diagnosis present

## 2024-01-15 DIAGNOSIS — R7881 Bacteremia: Secondary | ICD-10-CM

## 2024-01-15 DIAGNOSIS — L89629 Pressure ulcer of left heel, unspecified stage: Secondary | ICD-10-CM | POA: Diagnosis present

## 2024-01-15 DIAGNOSIS — E8729 Other acidosis: Secondary | ICD-10-CM | POA: Diagnosis not present

## 2024-01-15 DIAGNOSIS — S81801A Unspecified open wound, right lower leg, initial encounter: Secondary | ICD-10-CM | POA: Diagnosis present

## 2024-01-15 DIAGNOSIS — L89152 Pressure ulcer of sacral region, stage 2: Secondary | ICD-10-CM | POA: Diagnosis present

## 2024-01-15 DIAGNOSIS — E872 Acidosis, unspecified: Secondary | ICD-10-CM | POA: Diagnosis present

## 2024-01-15 DIAGNOSIS — S81802A Unspecified open wound, left lower leg, initial encounter: Secondary | ICD-10-CM | POA: Diagnosis present

## 2024-01-15 DIAGNOSIS — I480 Paroxysmal atrial fibrillation: Secondary | ICD-10-CM | POA: Diagnosis present

## 2024-01-15 DIAGNOSIS — E871 Hypo-osmolality and hyponatremia: Secondary | ICD-10-CM | POA: Diagnosis present

## 2024-01-15 DIAGNOSIS — I4891 Unspecified atrial fibrillation: Secondary | ICD-10-CM

## 2024-01-15 DIAGNOSIS — N2581 Secondary hyperparathyroidism of renal origin: Secondary | ICD-10-CM | POA: Diagnosis present

## 2024-01-15 DIAGNOSIS — G9341 Metabolic encephalopathy: Secondary | ICD-10-CM | POA: Diagnosis present

## 2024-01-15 DIAGNOSIS — R6521 Severe sepsis with septic shock: Secondary | ICD-10-CM | POA: Diagnosis present

## 2024-01-15 DIAGNOSIS — I38 Endocarditis, valve unspecified: Secondary | ICD-10-CM | POA: Diagnosis not present

## 2024-01-15 DIAGNOSIS — E119 Type 2 diabetes mellitus without complications: Secondary | ICD-10-CM

## 2024-01-15 DIAGNOSIS — I87339 Chronic venous hypertension (idiopathic) with ulcer and inflammation of unspecified lower extremity: Secondary | ICD-10-CM

## 2024-01-15 DIAGNOSIS — E66811 Obesity, class 1: Secondary | ICD-10-CM | POA: Diagnosis present

## 2024-01-15 DIAGNOSIS — D631 Anemia in chronic kidney disease: Secondary | ICD-10-CM | POA: Diagnosis present

## 2024-01-15 DIAGNOSIS — I120 Hypertensive chronic kidney disease with stage 5 chronic kidney disease or end stage renal disease: Secondary | ICD-10-CM | POA: Diagnosis not present

## 2024-01-15 DIAGNOSIS — I340 Nonrheumatic mitral (valve) insufficiency: Secondary | ICD-10-CM | POA: Diagnosis not present

## 2024-01-15 LAB — HEPATITIS B SURFACE ANTIGEN: Hepatitis B Surface Ag: NONREACTIVE

## 2024-01-15 LAB — BLOOD CULTURE ID PANEL (REFLEXED) - BCID2

## 2024-01-15 LAB — CBC
HCT: 25.2 % — ABNORMAL LOW (ref 39.0–52.0)
Hemoglobin: 7.9 g/dL — ABNORMAL LOW (ref 13.0–17.0)
MCH: 29 pg (ref 26.0–34.0)
MCHC: 31.3 g/dL (ref 30.0–36.0)
MCV: 92.6 fL (ref 80.0–100.0)
Platelets: 181 K/uL (ref 150–400)
RBC: 2.72 MIL/uL — ABNORMAL LOW (ref 4.22–5.81)
RDW: 16.9 % — ABNORMAL HIGH (ref 11.5–15.5)
WBC: 40.5 K/uL — ABNORMAL HIGH (ref 4.0–10.5)
nRBC: 0 % (ref 0.0–0.2)

## 2024-01-15 LAB — GLUCOSE, CAPILLARY
Glucose-Capillary: 137 mg/dL — ABNORMAL HIGH (ref 70–99)
Glucose-Capillary: 131 mg/dL — ABNORMAL HIGH (ref 70–99)
Glucose-Capillary: 120 mg/dL — ABNORMAL HIGH (ref 70–99)
Glucose-Capillary: 130 mg/dL — ABNORMAL HIGH (ref 70–99)
Glucose-Capillary: 131 mg/dL — ABNORMAL HIGH (ref 70–99)
Glucose-Capillary: 104 mg/dL — ABNORMAL HIGH (ref 70–99)

## 2024-01-15 LAB — BASIC METABOLIC PANEL WITH GFR
Anion gap: 22 — ABNORMAL HIGH (ref 5–15)
BUN: 31 mg/dL — ABNORMAL HIGH (ref 8–23)
CO2: 16 mmol/L — ABNORMAL LOW (ref 22–32)
Calcium: 8.6 mg/dL — ABNORMAL LOW (ref 8.9–10.3)
Chloride: 94 mmol/L — ABNORMAL LOW (ref 98–111)
Creatinine, Ser: 4.38 mg/dL — ABNORMAL HIGH (ref 0.61–1.24)
GFR, Estimated: 14 mL/min — ABNORMAL LOW (ref >60)
Glucose, Bld: 114 mg/dL — ABNORMAL HIGH (ref 70–99)
Potassium: 5.5 mmol/L — ABNORMAL HIGH (ref 3.5–5.1)
Sodium: 132 mmol/L — ABNORMAL LOW (ref 135–145)

## 2024-01-15 LAB — MAGNESIUM: Magnesium: 1.7 mg/dL (ref 1.7–2.4)

## 2024-01-15 LAB — MRSA NEXT GEN BY PCR, NASAL: MRSA by PCR Next Gen: NOT DETECTED

## 2024-01-15 LAB — LACTIC ACID, PLASMA: Lactic Acid, Venous: 1.9 mmol/L (ref 0.5–1.9)

## 2024-01-15 LAB — HEMOGLOBIN A1C
Hgb A1c MFr Bld: 5.6 % (ref 4.8–5.6)
Mean Plasma Glucose: 114.02 mg/dL

## 2024-01-15 MED ORDER — LACTATED RINGERS IV BOLUS
500.0000 mL | Freq: Once | INTRAVENOUS | Status: AC
  Administered 2024-01-15: 500 mL via INTRAVENOUS

## 2024-01-15 MED ORDER — VANCOMYCIN VARIABLE DOSE PER UNSTABLE RENAL FUNCTION (PHARMACIST DOSING): Status: DC

## 2024-01-15 MED ORDER — CEFAZOLIN SODIUM-DEXTROSE 1-4 GM/50ML-% IV SOLN
1.0000 g | INTRAVENOUS | Status: DC
  Administered 2024-01-15 – 2024-01-20 (×6): 1 g via INTRAVENOUS
  Filled 2024-01-15 (×8): qty 50

## 2024-01-15 MED ORDER — DOCUSATE SODIUM 100 MG PO CAPS: 100.0000 mg | ORAL_CAPSULE | Freq: Two times a day (BID) | ORAL | Status: DC | PRN

## 2024-01-15 MED ORDER — INSULIN ASPART 100 UNIT/ML IJ SOLN
0.0000 [IU] | INTRAMUSCULAR | Status: DC
  Administered 2024-01-15 – 2024-01-18 (×9): 1 [IU] via SUBCUTANEOUS
  Administered 2024-01-18: 2 [IU] via SUBCUTANEOUS
  Administered 2024-01-20 (×2): 1 [IU] via SUBCUTANEOUS

## 2024-01-15 MED ORDER — HEPARIN SODIUM (PORCINE) 5000 UNIT/ML IJ SOLN
5000.0000 [IU] | Freq: Three times a day (TID) | INTRAMUSCULAR | Status: DC
  Administered 2024-01-15 – 2024-01-23 (×23): 5000 [IU] via SUBCUTANEOUS
  Filled 2024-01-15 (×24): qty 1

## 2024-01-15 MED ORDER — GABAPENTIN 100 MG PO CAPS
100.0000 mg | ORAL_CAPSULE | Freq: Every day | ORAL | Status: DC
Start: 2024-01-15 — End: 2024-01-20
  Administered 2024-01-16 – 2024-01-19 (×5): 100 mg via ORAL
  Filled 2024-01-15 (×5): qty 1

## 2024-01-15 MED ORDER — CHLORHEXIDINE GLUCONATE CLOTH 2 % EX PADS: 6.0000 | MEDICATED_PAD | Freq: Every day | CUTANEOUS | Status: DC

## 2024-01-15 MED ORDER — MIDODRINE HCL 5 MG PO TABS
10.0000 mg | ORAL_TABLET | Freq: Three times a day (TID) | ORAL | Status: DC
  Administered 2024-01-15 – 2024-01-22 (×20): 10 mg via ORAL
  Filled 2024-01-15 (×21): qty 2

## 2024-01-15 MED ORDER — OXYCODONE-ACETAMINOPHEN 5-325 MG PO TABS
1.0000 | ORAL_TABLET | Freq: Three times a day (TID) | ORAL | Status: AC | PRN
  Administered 2024-01-15 – 2024-01-16 (×3): 1 via ORAL
  Filled 2024-01-15 (×3): qty 1

## 2024-01-15 MED ORDER — SILVER SULFADIAZINE 1 % EX CREA
TOPICAL_CREAM | Freq: Every day | CUTANEOUS | Status: DC
  Filled 2024-01-15 (×2): qty 85

## 2024-01-15 MED ORDER — ACETAMINOPHEN 325 MG PO TABS: 650.0000 mg | ORAL_TABLET | ORAL | Status: DC | PRN

## 2024-01-15 MED ORDER — TRAZODONE HCL 50 MG PO TABS
50.0000 mg | ORAL_TABLET | Freq: Every evening | ORAL | Status: DC | PRN
  Administered 2024-01-16 – 2024-01-22 (×5): 50 mg via ORAL
  Filled 2024-01-15 (×5): qty 1

## 2024-01-15 MED ORDER — OXYCODONE HCL 5 MG PO TABS: 5.0000 mg | ORAL_TABLET | Freq: Four times a day (QID) | ORAL | Status: DC | PRN

## 2024-01-15 MED ORDER — TIZANIDINE HCL 4 MG PO TABS
4.0000 mg | ORAL_TABLET | Freq: Two times a day (BID) | ORAL | Status: DC | PRN
  Administered 2024-01-16 – 2024-01-17 (×2): 4 mg via ORAL
  Filled 2024-01-15 (×4): qty 1

## 2024-01-15 MED ORDER — CHLORHEXIDINE GLUCONATE CLOTH 2 % EX PADS
6.0000 | MEDICATED_PAD | Freq: Every day | CUTANEOUS | Status: DC
  Administered 2024-01-15: 6 via TOPICAL

## 2024-01-15 MED ORDER — PIPERACILLIN-TAZOBACTAM IN DEX 2-0.25 GM/50ML IV SOLN
2.2500 g | Freq: Three times a day (TID) | INTRAVENOUS | Status: DC
  Administered 2024-01-15: 2.25 g via INTRAVENOUS
  Filled 2024-01-15 (×2): qty 50

## 2024-01-15 MED ORDER — POLYETHYLENE GLYCOL 3350 17 G PO PACK: 17.0000 g | PACK | Freq: Every day | ORAL | Status: DC | PRN

## 2024-01-15 MED ORDER — ORAL CARE MOUTH RINSE: 15.0000 mL | OROMUCOSAL | Status: DC | PRN

## 2024-01-15 MED ORDER — NOREPINEPHRINE 4 MG/250ML-% IV SOLN: 0.0000 ug/min | INTRAVENOUS | Status: DC

## 2024-01-15 NOTE — Consult Note (Addendum)
 WOC Nurse Consult Note: patient is followed at [facility] for venous ulcers B lower legs and R heel/plantar foot diabetic wounds; last seen there 01/12/2024 with orders for Silvadene  to legs and Aquacel to R heel and plantar foot wounds  Reason for Consult: leg wounds  Wound type: 1.  B lower legs full thickness r/t venous insufficiency per [facility] notes  2.  Full thickness R heel and R plantar foot diabetic ulcers  3.  Stage 2 Pressure Injury sacrum  4.  B arms partial thickness r/t trauma (skin tears)   Pressure Injury POA: Yes sacrum only  Measurement: see nursing flowsheet  Wound bed: B leg wounds primarily black eschar; B arms pink moist; sacrum pink moist; R heel and plantar foot with brown tissue  Drainage (amount, consistency, odor) see nursing flowsheet  Periwound: ecchymosis to B arms  Dressing procedure/placement/frequency:  Cleanse B arm wounds/skin tears with NS, apply Vaseline gauze (Lawson #239) to wound beds daily and secure with silicone foam or Kerlix roll gauze whichever is preferred.   Cleanse B lower leg wounds with Vashe, do not rinse and allow to air dry. Apply Silvadene  cream to wound beds daily, cover with Telfa nonstick dressing.  Cleanse R heel and plantar foot wounds with Vashe, apply silver  hydrofiber (Aquacel AG Lawson [phone]) to wound beds daily.  Secure entire dressing  with Kerlix roll gauze beginning right above toes and ending right below knees.  May apply Ace bandage wrapped in same fashion as Kerlix for light compression.   Cleanse sacrum with Vashe, apply Xeroform gauze (Lawson [phone]) to wound bed daily and secure with silicone foam.    POC discussed with bedside nurse. WOC team will not follow. Re-consult if further needs arise.   Thank you,    Powell Bar MSN, RN-BC, [employer]

## 2024-01-15 NOTE — Progress Notes (Signed)
 Pharmacy Antibiotic Note  Calvin Schultz is a 72 y.o. male admitted on 01/14/2024 with AMS/fever.  Pharmacy has been consulted for zosyn /vancomycin  dosing for sepsis. PMH includes bilateral leg wounds and sacrum wound  -WBC 47, HD MWF (received tx 9/10), Tmax 100.3 -Zosyn Josephus x1  Plan: -Zosyn  2.25gm IV every 8 hours -Vancomycin  2000mg  IV x1 -Vancomycin  1000mg  IV after every HD session -Monitor renal function for dose adjustments  -Follow up signs of clinical improvement, LOT, de-escalation of antibiotics   Height: 5' 10 (177.8 cm) Weight: 99 kg (218 lb 4.1 oz) IBW/kg (Calculated) : 73  Temp (24hrs), Avg:99.2 F (37.3 C), Min:98.1 F (36.7 C), Max:100.3 F (37.9 C)  Recent Labs  Lab 01/14/24 2018 01/14/24 2022 01/14/24 2031 01/14/24 2239  WBC 47.4*  --   --   --   CREATININE 4.07*  --  4.10*  --   LATICACIDVEN  --  3.6* 3.0* 2.7*    Estimated Creatinine Clearance: 19.8 mL/min (A) (by C-G formula based on SCr of 4.1 mg/dL (H)).    No Known Allergies  Antimicrobials this admission: Zosyn  9/11 >>  Vancomycin  9/11 >>   Microbiology results: 9/11 BCx:  9/12 MRSA PCR:   Thank you for allowing pharmacy to be a part of this patient's care.  Lynwood Poplar, PharmD, BCPS Clinical Pharmacist 01/15/2024 1:11 AM

## 2024-01-15 NOTE — Progress Notes (Signed)
 Pt receives out-pt HD at [facility] [place] on MWF 11:10 am chair time. Will assist as needed.   Randine Mungo Dialysis Navigator [phone]

## 2024-01-15 NOTE — Plan of Care (Signed)

## 2024-01-15 NOTE — Evaluation (Signed)
 Physical Therapy Evaluation Patient Details Name: Calvin Schultz MRN: [id number] DOB: October 15, 1950 Today's Date: 01/15/2024  History of Present Illness  Patient is a 73 y/o male admitted 01/14/24 with AMS and fever and hypotension with concern for septic shock.  PMH positive for ESRD on HD, DM 2, chronic a-fib, diastolic CHF, HTN, HLD, BPH, CKD III, LE venous statsis ulcers.  Clinical Impression  Patient presents with decreased mobility due to generalized weakness and decreased activity tolerance.  Hypotensive up in chair via lift and RN aware.  Patient previously mobilizing at home via hoyer lift.  Hopeful to progress to tolerate sitting better.  PT will continue to follow.  Recommend HHPT to resume at d/c.         If plan is discharge home, recommend the following: Two people to help with walking and/or transfers;Assistance with cooking/housework;Assist for transportation;Help with stairs or ramp for entrance;Two people to help with bathing/dressing/bathroom   Can travel by private vehicle        Equipment Recommendations None recommended by PT  Recommendations for Other Services       Functional Status Assessment Patient has had a recent decline in their functional status and demonstrates the ability to make significant improvements in function in a reasonable and predictable amount of time.     Precautions / Restrictions Precautions Precautions: Fall      Mobility  Bed Mobility Overal bed mobility: Needs Assistance Bed Mobility: Rolling Rolling: Mod assist, Used rails         General bed mobility comments: rolled with A for placement of lift pad    Transfers Overall transfer level: Needs assistance   Transfers: Bed to chair/wheelchair/BSC             General transfer comment: up to recliner via lift with +1 total A Transfer via Lift Equipment: [place]  Ambulation/Gait               General Gait Details: nonambulatory at baseline  Stairs             Wheelchair Mobility     Tilt Bed    Modified Rankin (Stroke Patients Only)       Balance Overall balance assessment: Needs assistance   Sitting balance-Leahy Scale: Fair Sitting balance - Comments: able to lean forward in chair for positioning lift pad and placing pillow, using UE support                                     Pertinent Vitals/Pain Pain Assessment Pain Assessment: Faces Faces Pain Scale: Hurts little more Pain Location: legs Pain Descriptors / Indicators: Aching, Discomfort Pain Intervention(s): Monitored during session, Repositioned, Limited activity within patient's tolerance    Home Living Family/patient expects to be discharged to:: Private residence Living Arrangements: Spouse/significant other Available Help at Discharge: Family;Available 24 hours/day Type of Home: House Home Access: Ramped entrance       Home Layout: Two level;Able to live on main level with bedroom/bathroom Home Equipment: Rolling Walker (2 wheels);Rollator (4 wheels);Cane - single point;BSC/3in1;Shower seat;Grab bars - toilet;Wheelchair - manual;Hospital bed;Other (comment) Additional Comments: Hoyer lift for OOB to wheelchair.    Prior Function Prior Level of Function : Needs assist             Mobility Comments: Patient states hoyer lift from hospital bed to wheelchair.  Patient has not been able to stand. ADLs Comments: Spouse assists with bedlevel  and wheelchair level bathing/dressing.  Bedpan at home.     Extremity/Trunk Assessment   Upper Extremity Assessment Upper Extremity Assessment: Generalized weakness    Lower Extremity Assessment Lower Extremity Assessment: RLE deficits/detail;LLE deficits/detail RLE Deficits / Details: limited knee flexion bilateral to about 40, lifts leg antigravity though cannot sustain, ankle AROM WFL, wearing heel protectors and with kerlix on feet and lower legs bilaterally LLE Deficits / Details: limited knee  flexion bilateral to about 40, lifts leg antigravity though cannot sustain, ankle AROM WFL, wearing heel protectors and with kerlix on feet and lower legs bilaterally    Cervical / Trunk Assessment Cervical / Trunk Assessment: Kyphotic  Communication   Communication Communication: No apparent difficulties    Cognition Arousal: Alert Behavior During Therapy: WFL for tasks assessed/performed   PT - Cognitive impairments: No apparent impairments                                 Cueing Cueing Techniques: Verbal cues     General Comments General comments (skin integrity, edema, etc.): BP after up to chair 70's/40's, leaned chair back and elevated feet on pillows BP 88/50's, RN aware; on RA SpO2 drop to 87% back to 90's after settled in chair    Exercises     Assessment/Plan    PT Assessment Patient needs continued PT services  PT Problem List Decreased strength;Decreased balance;Decreased range of motion;Decreased mobility;Decreased knowledge of use of DME;Decreased activity tolerance;Cardiopulmonary status limiting activity       PT Treatment Interventions DME instruction;Functional mobility training;Balance training;Patient/family education;Therapeutic activities;Therapeutic exercise;Cognitive remediation;Manual techniques    PT Goals (Current goals can be found in the Care Plan section)  Acute Rehab PT Goals Patient Stated Goal: return home with wife PT Goal Formulation: With patient Time For Goal Achievement: 01/29/24 Potential to Achieve Goals: Fair    Frequency Min 2X/week     Co-evaluation               AM-PAC PT 6 Clicks Mobility  Outcome Measure Help needed turning from your back to your side while in a flat bed without using bedrails?: A Lot Help needed moving from lying on your back to sitting on the side of a flat bed without using bedrails?: Total Help needed moving to and from a bed to a chair (including a wheelchair)?: Total Help  needed standing up from a chair using your arms (e.g., wheelchair or bedside chair)?: Total Help needed to walk in hospital room?: Total Help needed climbing 3-5 steps with a railing? : Total 6 Click Score: 7    End of Session   Activity Tolerance: Patient tolerated treatment well Patient left: in chair;with call bell/phone within reach;with chair alarm set Nurse Communication: Mobility status;Need for lift equipment PT Visit Diagnosis: Muscle weakness (generalized) (M62.81);Other abnormalities of gait and mobility (R26.89)    Time: 8959-8885 PT Time Calculation (min) (ACUTE ONLY): 34 min   Charges:   PT Evaluation $PT Eval Moderate Complexity: 1 Mod PT Treatments $Therapeutic Activity: 8-22 mins PT General Charges $$ ACUTE PT VISIT: 1 Visit         Micheline Portal, PT Acute Rehabilitation Services [phone] 01/15/2024   Montie Portal 01/15/2024, 12:26 PM

## 2024-01-15 NOTE — Progress Notes (Signed)
 NAME:  Calvin Schultz, MRN:  [id number], DOB:  12/24/1950, LOS: 0 ADMISSION DATE:  01/14/2024, CONSULTATION DATE:  9/12 REFERRING MD:  Dr. Dub, CHIEF COMPLAINT:  septic shock   History of Present Illness:  Patient is a 73 yo M w/ pertinent PMH ESRD on HD MWF, T2DM, chronic A-fib, diastolic CHF, HTN, HLD presents to [facility] on 9/11 with septic shock.  Patient seen outpt w/ [facility] for b/l leg venous stasis ulcer for debridement and wound care. Had dvt us  and abi of ble on 9/11 which were normal. Per patient's wife patient been more altered and poor po intake on 9/11 prompting her to come to [facility] ED for further eval. On arrival febrile 100.3 F and hypotensive given gentle iv fluids. Wbc 47 and LA 3.6. UA pending. CXR w/ b/l infiltrate vs. atelectasis. B/l leg wound present. Patient denies any CP, sob, abd pain, n/v/d, urinary symptoms. Cultures obtained and started on broad spectrum abx. Mental some improved w/ improving bp. CT head negative for acute abnormality. Despite iv fluids patient remained hypotensive started on levo. Pccm consulted for icu admission.   Pertinent  Medical History   Past Medical History:  Diagnosis Date   Arthritis    Atrial fibrillation (HCC)    CKD (chronic kidney disease) stage 3, GFR 30-59 ml/min (HCC)    Diabetes mellitus without complication (HCC)    Dysrhythmia    Hyperlipidemia    Hypertension     Significant Hospital Events: Including procedures, antibiotic start and stop dates in addition to other pertinent events   9/12 admit w/ septic shock on levo  Interim History / Subjective:  No fevers, weaned off oxygen from 2 L to room air. Off pressors.  Objective    Blood pressure 92/80, pulse 60, temperature 98.4 F (36.9 C), temperature source Axillary, resp. rate 15, height 5' 8 (1.727 m), weight 102.5 kg, SpO2 92%.        Intake/Output Summary (Last 24 hours) at 01/15/2024 0750 Last data filed at 01/15/2024 9272 Gross per 24 hour  Intake  2035.12 ml  Output --  Net 2035.12 ml   Filed Weights   01/14/24 2001 01/15/24 0215  Weight: 99 kg 102.5 kg    Examination: General:  ill appearing male in NAD HEENT: MM pink/dry Neuro: Aox3; MAE CV: s1s2, RRR, no m/r/g PULM: Clear bilateral lungs. GI: soft, bsx4 active  Extremities: warm/dry, right tunneled hd w/ no erythema/drainage; bilateral lower extremity wrapped. Multiple chronic ecchymosis on bilateral arms. Images below from initial presentation: Left lateral leg wound  Right medial leg wound  Right lateral leg wound    Labs: WBC 47.4> 40.5 Hb 8.1 Sodium 134 Lactic acid 3.0 > 2.7 >1.9  Micro: RVP negative Blood cultures 4/4 Staph aureus  Resolved problem list   Assessment and Plan    Septic shock MSSA bacteremia Improved, off pressors, stable on room air.  WBC elevated to 14.5, but improving.  Lactic acidosis has resolved.  Septic shock secondary to bilateral wound, blood cultures growing MSSA.  X-rays of bilateral foot were negative for osteomyelitis.  No cough or congestion, bilateral effusion on chest x-ray likely secondary to fluid overload.  UA not collected. Plan: -ID consulted - Will need TTE - De-escalate antibiotics to cefazolin   Acute encephalopathy: likely from sepsis and hypotension Resolved, CT negative for acute abnormality. Plan: - Restarted gabapentin , norco/vicodin, zanaflex , and trazodone   ESRD on HD MWF Consulted nephrology,  will be dialyzed today.  Mild hyponatremia, K5.5.  - Electrolytes  will correct with HD - BMP -Avoid nephrotoxic agents, ensure adequate renal perfusion  T2DM A1c 5.6, BG at goal. Plan: - SSI  Chronic A-fib:  Rate controlled W/o medicine, not on anticoagulation due to previous acute blood loss from hematuria.  Diastolic CHF HTN HLD Plan: -tele monitoring -not currently on anti-htn meds or statin -hold diuretics for now -daily weights; strict I/o's  Anemia of ESRD Hb stable at 7.9.  Best  Practice (right click and Reselect all SmartList Selections daily)   Diet/type: clear liquids DVT prophylaxis: prophylactic heparin   GI prophylaxis: N/A Lines: N/A Foley:  N/A Code Status:  full code Last date of multidisciplinary goals of care discussion [9/12 patient and wife updated at bedside]   Labs   CBC: Recent Labs  Lab 01/14/24 2018 01/14/24 2031  WBC 47.4*  --   NEUTROABS 44.6*  --   HGB 8.1* 9.5*  HCT 26.1* 28.0*  MCV 92.6  --   PLT 229  --     Basic Metabolic Panel: Recent Labs  Lab 01/14/24 2018 01/14/24 2031  NA 134* 133*  K 4.6 4.7  CL 96* 96*  CO2 23  --   GLUCOSE 160* 160*  BUN 27* 31*  CREATININE 4.07* 4.10*  CALCIUM  8.7*  --    GFR: Estimated Creatinine Clearance: 19.4 mL/min (A) (by C-G formula based on SCr of 4.1 mg/dL (H)). Recent Labs  Lab 01/14/24 2018 01/14/24 2022 01/14/24 2031 01/14/24 2239  WBC 47.4*  --   --   --   LATICACIDVEN  --  3.6* 3.0* 2.7*    Liver Function Tests: Recent Labs  Lab 01/14/24 2018  AST 20  ALT 11  ALKPHOS 105  BILITOT 1.7*  PROT 6.4*  ALBUMIN  2.4*   No results for input(s): LIPASE, AMYLASE in the last 168 hours. No results for input(s): AMMONIA in the last 168 hours.  ABG    Component Value Date/Time   TCO2 24 01/14/2024 2031     Coagulation Profile: Recent Labs  Lab 01/14/24 2018  INR 1.3*    Cardiac Enzymes: No results for input(s): CKTOTAL, CKMB, CKMBINDEX, TROPONINI in the last 168 hours.  HbA1C: Hgb A1c MFr Bld  Date/Time Value Ref Range Status  06/26/2023 04:48 PM 6.2 (H) 4.8 - 5.6 % Final    Comment:    (NOTE) Pre diabetes:          5.7%-6.4%  Diabetes:              >6.4%  Glycemic control for   <7.0% adults with diabetes     CBG: Recent Labs  Lab 01/15/24 0327 01/15/24 0745  GLUCAP 131* 120*   Missy Sandhoff, M.D.  Internal Medicine Resident, PGY-2  8:04 AM, 01/15/2024

## 2024-01-15 NOTE — TOC Progression Note (Signed)
 Transition of Care [facility]) - Progression Note    Patient Details  Name: Chevis Weisensel MRN: [id number] Date of Birth: 1951-03-06  Transition of Care [facility] CM/SW Contact  Roxie KANDICE Stain, RN Phone Number: 01/15/2024, 4:05 PM  Clinical Narrative:    Patient is active with [facility] for PT, RN. Will need resumption orders.  Expected Discharge Plan: Home w Home Health Services Barriers to Discharge: Continued Medical Work up               Expected Discharge Plan and Services   Discharge Planning Services: CM Consult Post Acute Care Choice: Home Health, Durable Medical Equipment Living arrangements for the past 2 months: Single Family Home                           HH Arranged: PT, RN [facility] Agency: [facility] Date [facility] Agency Contacted: 01/15/24 Time HH Agency Contacted: 1605 Representative spoke with at [facility] Agency: Amy   Social Drivers of Health (SDOH) Interventions SDOH Screenings   Food Insecurity: No Food Insecurity (01/15/2024)  Housing: Low Risk  (01/15/2024)  Transportation Needs: No Transportation Needs (01/15/2024)  Utilities: Not At Risk (01/15/2024)  Social Connections: Unknown (01/15/2024)  Tobacco Use: Low Risk  (01/14/2024)    Readmission Risk Interventions    07/10/2023   11:21 AM 06/27/2023    1:00 PM  Readmission Risk Prevention Plan  Transportation Screening Complete Complete  PCP or Specialist Appt within 5-7 Days  Complete  PCP or Specialist Appt within 3-5 Days Complete   Home Care Screening  Complete  Medication Review (RN CM)  Complete  HRI or Home Care Consult Complete   Social Work Consult for Recovery Care Planning/Counseling Complete   Palliative Care Screening Not Applicable   Medication Review Oceanographer) Referral to Pharmacy

## 2024-01-15 NOTE — Consult Note (Signed)
 [name]  INPATIENT CONSULTATION  Reason for Consultation: ESRD comanagement Requesting Provider: Dr. Jude  HPI: Calvin Schultz is an 73 y.o. male ESRD on HD, nephrolithiasis, HFrEF 45%A fib, Dm, HTN, HL currently admitted with sepsis and nephrology is consulted for ESRD and assoc conditions co management.   Presented to [facility] yesterday with fever and hypotension requiring vasopressor support.  Source though to be leg wound and he's been started on broad spectrum abx vanc pip/tazo.  Bld cx pending. Lactate was 3.6 >  2.7 > normal this AM.  Overnight he's been weaned off vasopressors.  AM labs are pending.  K 4.7 yesterday, bicarb 23, BUN 27, alb 2.4.   He started dialysis earlier this year during a hosp admission.  Currently going MWF to [facility].  No issues with TDC recently.   PMH: Past Medical History:  Diagnosis Date   Arthritis    Atrial fibrillation (HCC)    CKD (chronic kidney disease) stage 3, GFR 30-59 ml/min (HCC)    Diabetes mellitus without complication (HCC)    Dysrhythmia    Hyperlipidemia    Hypertension    PSH: Past Surgical History:  Procedure Laterality Date   AV FISTULA PLACEMENT Left 10/09/2023   Procedure: ARTERIOVENOUS (AV) FISTULA CREATION, FIRST STAGE BASILIC;  Surgeon: Pearline Norman RAMAN, MD;  Location: [facility] OR;  Service: Vascular;  Laterality: Left;  LEFT ARM AVF VERSUS AVG   CYSTOSCOPY W/ URETERAL STENT PLACEMENT Right 08/05/2023   Procedure: CYSTOSCOPY, WITH RETROGRADE PYELOGRAM AND URETERAL STENT INSERTION;  Surgeon: Roseann Adine PARAS., MD;  Location: [facility] OR;  Service: Urology;  Laterality: Right;   CYSTOSCOPY/URETEROSCOPY/HOLMIUM LASER/STENT PLACEMENT Left 07/24/2023   Procedure: CYSTOSCOPY/URETEROSCOPY/HOLMIUM LASER/ STENT PLACEMENT/REMOVAL OF NEPHROSTOMY TUBE;  Surgeon: Nieves Cough, MD;  Location: [facility] ORS;  Service: Urology;  Laterality: Left;  90 MINUTE CASE   CYSTOSCOPY/URETEROSCOPY/HOLMIUM LASER/STENT PLACEMENT Bilateral 08/18/2023    Procedure: CYSTOSCOPY/URETEROSCOPY/HOLMIUM LASER/STENT PLACEMENT;  Surgeon: Nieves Cough, MD;  Location: [facility] OR;  Service: Urology;  Laterality: Bilateral;   INSERTION OF DIALYSIS CATHETER Right 10/09/2023   Procedure: INSERTION OF DIALYSIS CATHETER, USING PALINDROME CATHETER KIT 19CM;  Surgeon: Pearline Norman RAMAN, MD;  Location: [facility] OR;  Service: Vascular;  Laterality: Right;  TUNNELED DIALYSIS CATHETER   IR NEPHROSTOMY PLACEMENT LEFT  06/03/2023   KIDNEY SURGERY     TUNNELLED CATHETER EXCHANGE N/A 11/20/2023   Procedure: TUNNELLED CATHETER EXCHANGE;  Surgeon: Sheree Penne Bruckner, MD;  Location: [facility];  Service: Cardiovascular;  Laterality: N/A;    Past Medical History:  Diagnosis Date   Arthritis    Atrial fibrillation (HCC)    CKD (chronic kidney disease) stage 3, GFR 30-59 ml/min (HCC)    Diabetes mellitus without complication (HCC)    Dysrhythmia    Hyperlipidemia    Hypertension     Medications:  I have reviewed the patient's current medications.  Medications Prior to Admission  Medication Sig Dispense Refill   acetaminophen  (TYLENOL ) 325 MG tablet Take 2 tablets (650 mg total) by mouth every 4 (four) hours as needed for mild pain (pain score 1-3) or fever.     furosemide  (LASIX ) 80 MG tablet Take 1 tablet (80 mg total) by mouth See admin instructions. Take 1 tablet (80 mg) by mouth every Tuesday, Thursday, Saturday, and Sunday 30 tablet 0   gabapentin  (NEURONTIN ) 100 MG capsule Take 100 mg by mouth at bedtime.     HYDROcodone -acetaminophen  (NORCO/VICODIN) 5-325 MG tablet Take 1 tablet by mouth every 6 (six) hours as needed for  moderate pain (pain score 4-6).     iron  polysaccharides (NIFEREX) 150 MG capsule Take 1 capsule (150 mg total) by mouth daily.     LANTUS SOLOSTAR 100 UNIT/ML Solostar Pen Inject 3-4 Units into the skin daily as needed (for hyperglycemia).     multivitamin (RENA-VIT) TABS tablet Take 1 tablet by mouth at bedtime.     Nystatin (GERHARDT'S BUTT  CREAM) CREA Apply 1 Application topically 2 (two) times daily. 60 each 0   psyllium (REGULOID) 0.52 g capsule Take 4 capsules by mouth daily.     silver  sulfADIAZINE  (SILVADENE ) 1 % cream Apply 1 Application topically daily. Apply to legs     tamsulosin  (FLOMAX ) 0.4 MG CAPS capsule Take 1 capsule (0.4 mg total) by mouth daily after supper. (Patient taking differently: Take 0.4 mg by mouth daily after breakfast.) 30 capsule 0   tiZANidine  (ZANAFLEX ) 4 MG tablet Take 1 tablet (4 mg total) by mouth 2 (two) times daily as needed for muscle spasms.     traZODone  (DESYREL ) 50 MG tablet Take 1 tablet (50 mg total) by mouth at bedtime as needed for sleep. 10 tablet 0    ALLERGIES:  No Known Allergies  FAM HX: Family History  Problem Relation Age of Onset   Dementia Mother     Social History:   reports that he has never smoked. He has never used smokeless tobacco. He reports that he does not drink alcohol  and does not use drugs.  ROS: 12 system ROS neg except per HPI above  Blood pressure 92/80, pulse 60, temperature 98.4 F (36.9 C), temperature source Axillary, resp. rate 15, height 5' 8 (1.727 m), weight 102.5 kg, SpO2 92%. PHYSICAL EXAM: Gen: chronically ill but nontoxic appearing, tired  Eyes: EOMI ENT: MMM Neck:RIJ TDC c/d/I, no tenderness CV: RRR Abd: soft Lungs: normal WOB on RA GU: no foley Extr: trace LE edema, 4 extr wrapped in kerlex Neuro: conversant    Results for orders placed or performed during the hospital encounter of 01/14/24 (from the past 48 hours)  Comprehensive metabolic panel     Status: Abnormal   Collection Time: 01/14/24  8:18 PM  Result Value Ref Range   Sodium 134 (L) 135 - 145 mmol/L   Potassium 4.6 3.5 - 5.1 mmol/L   Chloride 96 (L) 98 - 111 mmol/L   CO2 23 22 - 32 mmol/L   Glucose, Bld 160 (H) 70 - 99 mg/dL    Comment: Glucose reference range applies only to samples taken after fasting for at least 8 hours.   BUN 27 (H) 8 - 23 mg/dL    Creatinine, Ser 5.92 (H) 0.61 - 1.24 mg/dL   Calcium  8.7 (L) 8.9 - 10.3 mg/dL   Total Protein 6.4 (L) 6.5 - 8.1 g/dL   Albumin  2.4 (L) 3.5 - 5.0 g/dL   AST 20 15 - 41 U/L   ALT 11 0 - 44 U/L   Alkaline Phosphatase 105 38 - 126 U/L   Total Bilirubin 1.7 (H) 0.0 - 1.2 mg/dL   GFR, Estimated 15 (L) >60 mL/min    Comment: (NOTE) Calculated using the CKD-EPI Creatinine Equation (2021)    Anion gap 15 5 - 15    Comment: Performed at [facility], [address] [place], [place] [address]  CBC with Differential     Status: Abnormal   Collection Time: 01/14/24  8:18 PM  Result Value Ref Range   WBC 47.4 (H) 4.0 - 10.5 K/uL  RBC 2.82 (L) 4.22 - 5.81 MIL/uL   Hemoglobin 8.1 (L) 13.0 - 17.0 g/dL   HCT 73.8 (L) 60.9 - 47.9 %   MCV 92.6 80.0 - 100.0 fL   MCH 28.7 26.0 - 34.0 pg   MCHC 31.0 30.0 - 36.0 g/dL   RDW 83.3 (H) 88.4 - 84.4 %   Platelets 229 150 - 400 K/uL   nRBC 0.0 0.0 - 0.2 %   Neutrophils Relative % 94 %   Neutro Abs 44.6 (H) 1.7 - 7.7 K/uL   Lymphocytes Relative 1 %   Lymphs Abs 0.5 (L) 0.7 - 4.0 K/uL   Monocytes Relative 5 %   Monocytes Absolute 2.4 (H) 0.1 - 1.0 K/uL   Eosinophils Relative 0 %   Eosinophils Absolute 0.0 0.0 - 0.5 K/uL   Basophils Relative 0 %   Basophils Absolute 0.0 0.0 - 0.1 K/uL   WBC Morphology See Note     Comment:  Increased Bands. >20% Bands   Smear Review See Note     Comment:  Normal Platelet Morphology   Polychromasia PRESENT     Comment: Performed at [facility], [address], [place], [place] [address]  Protime-INR     Status: Abnormal   Collection Time: 01/14/24  8:18 PM  Result Value Ref Range   Prothrombin Time 16.8 (H) 11.4 - 15.2 seconds   INR 1.3 (H) 0.8 - 1.2    Comment: (NOTE) INR goal varies based on device and disease states. Performed at [facility], [address] [place], [place] [address]   I-Stat Lactic Acid, ED     Status: Abnormal   Collection Time: 01/14/24  8:22 PM  Result Value Ref  Range   Lactic Acid, Venous 3.6 (HH) 0.5 - 1.9 mmol/L   Comment NOTIFIED PHYSICIAN   Culture, blood (Routine x 2)     Status: None (Preliminary result)   Collection Time: 01/14/24  8:23 PM   Specimen: BLOOD RIGHT FOREARM  Result Value Ref Range   Specimen Description BLOOD RIGHT FOREARM    Special Requests      BOTTLES DRAWN AEROBIC AND ANAEROBIC Blood Culture adequate volume   Culture  Setup Time      GRAM POSITIVE COCCI IN CLUSTERS ANAEROBIC BOTTLE ONLY Organism ID to follow Performed at [facility], [address] [place], [place] [address]    Culture GRAM POSITIVE COCCI    Report Status PENDING   I-stat chem 8, ED (not at [facility], [facility] or [facility])     Status: Abnormal   Collection Time: 01/14/24  8:31 PM  Result Value Ref Range   Sodium 133 (L) 135 - 145 mmol/L   Potassium 4.7 3.5 - 5.1 mmol/L   Chloride 96 (L) 98 - 111 mmol/L   BUN 31 (H) 8 - 23 mg/dL   Creatinine, Ser 5.89 (H) 0.61 - 1.24 mg/dL   Glucose, Bld 839 (H) 70 - 99 mg/dL    Comment: Glucose reference range applies only to samples taken after fasting for at least 8 hours.   Calcium , Ion 1.08 (L) 1.15 - 1.40 mmol/L   TCO2 24 22 - 32 mmol/L   Hemoglobin 9.5 (L) 13.0 - 17.0 g/dL   HCT 71.9 (L) 60.9 - 47.9 %  I-Stat CG4 Lactic Acid, ED     Status: Abnormal   Collection Time: 01/14/24  8:31 PM  Result Value Ref Range   Lactic Acid, Venous 3.0 (HH) 0.5 - 1.9 mmol/L  Comment NOTIFIED PHYSICIAN   Resp panel by RT-PCR (RSV, Flu A&B, Covid) Anterior Nasal Swab     Status: None   Collection Time: 01/14/24  9:30 PM   Specimen: Anterior Nasal Swab  Result Value Ref Range   SARS Coronavirus 2 by RT PCR NEGATIVE NEGATIVE   Influenza A by PCR NEGATIVE NEGATIVE   Influenza B by PCR NEGATIVE NEGATIVE    Comment: (NOTE) The Xpert Xpress SARS-CoV-2/FLU/RSV plus assay is intended as an aid in the diagnosis of influenza from Nasopharyngeal swab specimens and should not be used as a sole basis for treatment. Nasal washings  and aspirates are unacceptable for Xpert Xpress SARS-CoV-2/FLU/RSV testing.  Fact Sheet for Patients: BloggerCourse.com  Fact Sheet for Healthcare Providers: SeriousBroker.it  This test is not yet approved or cleared by the [place]  FDA and has been authorized for detection and/or diagnosis of SARS-CoV-2 by FDA under an Emergency Use Authorization (EUA). This EUA will remain in effect (meaning this test can be used) for the duration of the COVID-19 declaration under Section 564(b)(1) of the Act, 21 U.S.C. section 360bbb-3(b)(1), unless the authorization is terminated or revoked.     Resp Syncytial Virus by PCR NEGATIVE NEGATIVE    Comment: (NOTE) Fact Sheet for Patients: BloggerCourse.com  Fact Sheet for Healthcare Providers: SeriousBroker.it  This test is not yet approved or cleared by the [place]  FDA and has been authorized for detection and/or diagnosis of SARS-CoV-2 by FDA under an Emergency Use Authorization (EUA). This EUA will remain in effect (meaning this test can be used) for the duration of the COVID-19 declaration under Section 564(b)(1) of the Act, 21 U.S.C. section 360bbb-3(b)(1), unless the authorization is terminated or revoked.  Performed at [facility], [address], [place], [place] [address]   I-Stat Lactic Acid, ED     Status: Abnormal   Collection Time: 01/14/24 10:39 PM  Result Value Ref Range   Lactic Acid, Venous 2.7 (HH) 0.5 - 1.9 mmol/L   Comment NOTIFIED PHYSICIAN   MRSA Next Gen by PCR, Nasal     Status: None   Collection Time: 01/15/24  1:01 AM   Specimen: Nasal Mucosa; Nasal Swab  Result Value Ref Range   MRSA by PCR Next Gen NOT DETECTED NOT DETECTED    Comment: (NOTE) The GeneXpert MRSA Assay (FDA approved for NASAL specimens only), is one component of a comprehensive MRSA colonization surveillance program. It is not  intended to diagnose MRSA infection nor to guide or monitor treatment for MRSA infections. Test performance is not FDA approved in patients less than 19 years old. Performed at [facility], [address] [place], [place] [address]   Glucose, capillary     Status: Abnormal   Collection Time: 01/15/24  3:27 AM  Result Value Ref Range   Glucose-Capillary 131 (H) 70 - 99 mg/dL    Comment: Glucose reference range applies only to samples taken after fasting for at least 8 hours.  Lactic acid, plasma     Status: None   Collection Time: 01/15/24  6:57 AM  Result Value Ref Range   Lactic Acid, Venous 1.9 0.5 - 1.9 mmol/L    Comment: Performed at [facility], [address] [place], [place] [address]  Glucose, capillary     Status: Abnormal   Collection Time: 01/15/24  7:45 AM  Result Value Ref Range   Glucose-Capillary 120 (H) 70 - 99 mg/dL    Comment: Glucose reference range applies  only to samples taken after fasting for at least 8 hours.    DG Tibia/Fibula Left Result Date: 01/15/2024 EXAM: 3 VIEW(S) XRAY OF THE LEFT TIBIA AND FIBULA 01/15/2024 02:10:56 AM COMPARISON: None available. CLINICAL HISTORY: Sepsis (HCC) [id number]. sepses FINDINGS: BONES AND JOINTS: No acute fracture. No focal osseous lesion. No joint dislocation. Advanced degenerative changes of the left knee. SOFT TISSUES: Diffuse subcutaneous edema. Atherosclerotic changes throughout visualized arterial segments. IMPRESSION: 1. No acute findings. 2. Advanced degenerative changes of the left knee. Electronically signed by: Norman Gatlin MD 01/15/2024 02:15 AM EDT RP Workstation: [id number]   DG Foot 2 Views Right Result Date: 01/15/2024 CLINICAL DATA:  Sepsis EXAM: RIGHT FOOT - 2 VIEW COMPARISON:  None Available. FINDINGS: No acute bony abnormality. Specifically, no fracture, subluxation, or dislocation. No bone destruction. Soft tissues are intact. Vascular calcifications. No soft tissue gas. IMPRESSION: No acute  bony abnormality. Electronically Signed   By: Franky Crease M.D.   On: 01/15/2024 01:54   DG Ankle 2 Views Left Result Date: 01/15/2024 CLINICAL DATA:  Sepsis EXAM: LEFT ANKLE - 2 VIEW COMPARISON:  None Available. FINDINGS: No acute bony abnormality. Specifically, no fracture, subluxation, or dislocation. No bone destruction. Soft tissues are intact. Vascular calcifications. No soft tissue gas. IMPRESSION: No acute bony abnormality. Electronically Signed   By: Franky Crease M.D.   On: 01/15/2024 01:54   DG Ankle 2 Views Right Result Date: 01/15/2024 CLINICAL DATA:  Sepsis EXAM: RIGHT ANKLE - 2 VIEW COMPARISON:  None Available. FINDINGS: No acute bony abnormality. Specifically, no fracture, subluxation, or dislocation. No bone destruction. Soft tissues are intact. Vascular calcifications. No soft tissue gas. IMPRESSION: No acute bony abnormality. Electronically Signed   By: Franky Crease M.D.   On: 01/15/2024 01:54   DG Foot 2 Views Left Result Date: 01/15/2024 CLINICAL DATA:  Sepsis EXAM: LEFT FOOT - 2 VIEW COMPARISON:  None available FINDINGS: No acute bony abnormality. Specifically, no fracture, subluxation, or dislocation. No bone destruction. Soft tissues are intact. Vascular calcifications. No soft tissue gas. IMPRESSION: No acute bony abnormality. Electronically Signed   By: Franky Crease M.D.   On: 01/15/2024 01:53   DG Tibia/Fibula Right Result Date: 01/15/2024 CLINICAL DATA:  Sepsis, open wound to both calves EXAM: RIGHT TIBIA AND FIBULA - 2 VIEW COMPARISON:  07/31/2023 FINDINGS: Soft tissue and vascular calcifications. Advanced degenerative changes in the right knee. No acute bony abnormality. Specifically, no fracture, subluxation, or dislocation. No bone destruction. No soft tissue gas. IMPRESSION: No acute bony abnormality. Electronically Signed   By: Franky Crease M.D.   On: 01/15/2024 01:53   CT Head Wo Contrast Result Date: 01/14/2024 CLINICAL DATA:  Head trauma EXAM: CT HEAD WITHOUT  CONTRAST TECHNIQUE: Contiguous axial images were obtained from the base of the skull through the vertex without intravenous contrast. RADIATION DOSE REDUCTION: This exam was performed according to the departmental dose-optimization program which includes automated exposure control, adjustment of the mA and/or kV according to patient size and/or use of iterative reconstruction technique. COMPARISON:  None Available. FINDINGS: Brain: No acute territorial infarction, hemorrhage or intracranial mass. Patchy white matter hypodensity, probably chronic small vessel ischemic change. The ventricles are non enlarged. Vascular: No hyperdense vessels. Vertebral and carotid vascular calcification Skull: Normal. Negative for fracture or focal lesion. Sinuses/Orbits: No acute finding. Mucous retention cyst in the right maxillary sinus Other: None IMPRESSION: 1. No CT evidence for acute intracranial abnormality. 2. Patchy white matter hypodensity, probably chronic small vessel ischemic change. Electronically Signed  By: Luke Bun M.D.   On: 01/14/2024 22:29   VAS US  ABI WITH/WO TBI Result Date: 01/14/2024  LOWER EXTREMITY DOPPLER STUDY Patient Name:  Lena Fieldhouse  Date of Exam:   01/14/2024 Medical Rec #: [id number]    Accession #:    [id number] Date of Birth: 05/10/1950   Patient Gender: M Patient Age:   52 years Exam Location:  [address] Procedure:      VAS US  ABI WITH/WO TBI Referring Phys: JOSHUA DILLEY --------------------------------------------------------------------------------  Indications: Ulceration. High Risk Factors: Diabetes, no history of smoking.  Limitations: Today's exam was limited due to patient positioning, patient              intolerant to cuff pressure, an open wound, Unna boot in place,              involuntary patient movement and bandages. Performing Technologist: Edsel Mustard RVT  Examination Guidelines: A complete evaluation includes at minimum, Doppler waveform signals and systolic  blood pressure reading at the level of bilateral brachial, anterior tibial, and posterior tibial arteries, when vessel segments are accessible. Bilateral testing is considered an integral part of a complete examination. Photoelectric Plethysmograph (PPG) waveforms and toe systolic pressure readings are included as required and additional duplex testing as needed. Limited examinations for reoccurring indications may be performed as noted.  ABI Findings: +---------+------------------+-----+---------+--------+ Right    Rt Pressure (mmHg)IndexWaveform Comment  +---------+------------------+-----+---------+--------+ Brachial 141                                      +---------+------------------+-----+---------+--------+ PTA                             triphasic         +---------+------------------+-----+---------+--------+ DP       254               1.80 triphasic         +---------+------------------+-----+---------+--------+ [facility]               0.79 Normal            +---------+------------------+-----+---------+--------+ +---------+------------------+-----+-----------+-------+ Left     Lt Pressure (mmHg)IndexWaveform   Comment +---------+------------------+-----+-----------+-------+ PTA      254               1.80 multiphasic        +---------+------------------+-----+-----------+-------+ DP       254               1.80 triphasic          +---------+------------------+-----+-----------+-------+ Great Toe111               0.79 Normal             +---------+------------------+-----+-----------+-------+ Arterial wall calcification precludes accurate ankle pressures and ABIs. Waveforms demonstrate brisk, triphasic flow.  Summary: Right: Resting right ankle-brachial index indicates noncompressible right lower extremity arteries. The right toe-brachial index is normal.  Left: Resting left ankle-brachial index indicates noncompressible left lower extremity  arteries. The left toe-brachial index is normal.  *See table(s) above for measurements and observations.  Electronically signed by Gordy Bergamo MD on 01/14/2024 at 9:01:54 PM.    Final    VAS US  LOWER EXTREMITY VENOUS REFLUX Result Date: 01/14/2024  Lower Venous Reflux Study Patient Name:  Waylen Depaolo  Date of Exam:   01/14/2024  Medical Rec #: [id number]    Accession #:    [id number] Date of Birth: 09-04-1950   Patient Gender: M Patient Age:   69 years Exam Location:  [address] Procedure:      VAS US  LOWER EXTREMITY VENOUS REFLUX Referring Phys: FONDA MORITA --------------------------------------------------------------------------------  Indications: Ulceration. Patient had wounds that developed a few months ago from swelling and trauma. Significant edema.  Limitations: Bandages and open wound. Performing Technologist: Edsel Mustard RVT  Examination Guidelines: A complete evaluation includes B-mode imaging, spectral Doppler, color Doppler, and power Doppler as needed of all accessible portions of each vessel. Bilateral testing is considered an integral part of a complete examination. Limited examinations for reoccurring indications may be performed as noted. The reflux portion of the exam is performed with the patient in reverse Trendelenburg. Significant venous reflux is defined as >500 ms in the superficial venous system, and >1 second in the deep venous system.  Venous Reflux Times +--------------+---------+------+-----------+------------+--------+ RIGHT         Reflux NoRefluxReflux TimeDiameter cmsComments                         Yes                                  +--------------+---------+------+-----------+------------+--------+ CFV           no                                             +--------------+---------+------+-----------+------------+--------+ FV mid        no                                              +--------------+---------+------+-----------+------------+--------+ Popliteal     no                                             +--------------+---------+------+-----------+------------+--------+ GSV at [facility]    no                            0.78             +--------------+---------+------+-----------+------------+--------+ GSV prox thighno                            0.55             +--------------+---------+------+-----------+------------+--------+ GSV mid thigh no                            0.50             +--------------+---------+------+-----------+------------+--------+ GSV dist thighno                            0.50             +--------------+---------+------+-----------+------------+--------+ GSV at knee   no  0.40             +--------------+---------+------+-----------+------------+--------+ GSV prox calf no                            0.39             +--------------+---------+------+-----------+------------+--------+ SSV at [facility]    no                            0.26             +--------------+---------+------+-----------+------------+--------+  +--------------+---------+------+-----------+------------+--------+ LEFT          Reflux NoRefluxReflux TimeDiameter cmsComments                         Yes                                  +--------------+---------+------+-----------+------------+--------+ CFV           no                                             +--------------+---------+------+-----------+------------+--------+ FV mid        no                                             +--------------+---------+------+-----------+------------+--------+ Popliteal     no                                             +--------------+---------+------+-----------+------------+--------+ GSV at [facility]    no                            0.72              +--------------+---------+------+-----------+------------+--------+ GSV prox thighno                            0.39             +--------------+---------+------+-----------+------------+--------+ GSV mid thigh no                            0.38             +--------------+---------+------+-----------+------------+--------+ GSV dist thighno                            0.36             +--------------+---------+------+-----------+------------+--------+ GSV at knee   no                            0.30             +--------------+---------+------+-----------+------------+--------+ GSV prox calf no  0.17             +--------------+---------+------+-----------+------------+--------+ Unable to assess mid and distal calf due to open wounds and bandages.  Summary: Bilateral: Limited examination due to patient pain, bandages and open wounds. - No evidence of deep vein thrombosis seen in the lower extremities, bilaterally, from the common femoral through the popliteal veins. - No evidence of superficial venous thrombosis in the lower extremities, bilaterally. - No evidence of deep venous insufficiency seen bilaterally in the lower extremity. - No evidence of superficial venous reflux seen in the greater saphenous veins bilaterally. - No evidence of superficial venous reflux seen in the short saphenous veins bilaterally.  *See table(s) above for measurements and observations. Electronically signed by Gordy Bergamo MD on 01/14/2024 at 9:00:59 PM.    Final    DG Chest Port 1 View if patient is in a treatment room. Result Date: 01/14/2024 CLINICAL DATA:  Sepsis. EXAM: PORTABLE CHEST 1 VIEW COMPARISON:  Chest radiograph dated 10/13/2023. FINDINGS: Right sided dialysis catheter in similar position. There is shallow inspiration. There is cardiomegaly with vascular congestion. Bibasilar atelectasis or infiltrate. No pneumothorax. No acute osseous pathology. IMPRESSION: 1.  Cardiomegaly with vascular congestion. 2. Bibasilar atelectasis or infiltrate. Electronically Signed   By: Vanetta Chou M.D.   On: 01/14/2024 20:35   HD orders [facility]  Three times per week MWF TDC 180 NR 400/auto 2 3K/2.5 Ca EDW 97kg  4hrs Recent post wts 97.2, 100.7, 100.6 9/10 Hb 8.1 Meds mircera 100 q2wks, venofer  100 qtx, last mircera 9/1  Assessment/PlanGary Loken is an 73 y.o. male ESRD on HD, nephrolithiasis, HFrEF 45%A fib, Dm, HTN, HL currently admitted with sepsis and nephrology is consulted for ESRD and assoc conditions co management.   **Septic shock: presumed wound infection, blood cultures pending.  Improving hemodynamics.  Abx per primary.  Should bld cx be + will need TDC exchange.   **ESRD on HD: MWF generally, hemodynamics ok now.  Plan HD in ICU today. UF goal 2L  **Anemia: Hb 9.5 overnight, will be due for ESA on 9/15 - dose while inpt if needed  **Hyponatremia: hypervolemic, HD with UF today.   **Encephalopathy: appears improved. At the baseline I know him.   **DM type 2: per primary  **A fib, HFrEF:  volume mgmt with HD, otherwise per primary  Will follow, reach out w concerns.     Manuelita DELENA Barters 01/15/2024, 8:58 AM

## 2024-01-15 NOTE — H&P (Signed)
 NAME:  Calvin Schultz, MRN:  [id number], DOB:  Mar 14, 1951, LOS: 0 ADMISSION DATE:  01/14/2024, CONSULTATION DATE:  9/12 REFERRING MD:  Dr. Dub, CHIEF COMPLAINT:  septic shock   History of Present Illness:  Patient is a 73 yo M w/ pertinent PMH ESRD on HD MWF, T2DM, chronic A-fib, diastolic CHF, HTN, HLD presents to [facility] on 9/11 with septic shock.  Patient seen outpt w/ [facility] for b/l leg venous stasis ulcer for debridement and wound care. Had dvt us  and abi of ble on 9/11 which were normal. Per patient's wife patient been more altered and poor po intake on 9/11 prompting her to come to [facility] ED for further eval. On arrival febrile 100.3 F and hypotensive given gentle iv fluids. Wbc 47 and LA 3.6. UA pending. CXR w/ b/l infiltrate vs. atelectasis. B/l leg wound present. Patient denies any CP, sob, abd pain, n/v/d, urinary symptoms. Cultures obtained and started on broad spectrum abx. Mental some improved w/ improving bp. CT head negative for acute abnormality. Despite iv fluids patient remained hypotensive started on levo. Pccm consulted for icu admission.   Pertinent  Medical History   Past Medical History:  Diagnosis Date   Arthritis    Atrial fibrillation (HCC)    CKD (chronic kidney disease) stage 3, GFR 30-59 ml/min (HCC)    Diabetes mellitus without complication (HCC)    Dysrhythmia    Hyperlipidemia    Hypertension      Significant Hospital Events: Including procedures, antibiotic start and stop dates in addition to other pertinent events   9/12 admit w/ septic shock on levo  Interim History / Subjective:  See above  Objective    Blood pressure (!) 102/56, pulse 96, temperature 98.1 F (36.7 C), temperature source Oral, resp. rate (!) 25, height 5' 10 (1.778 m), weight 99 kg, SpO2 92%.        Intake/Output Summary (Last 24 hours) at 01/15/2024 0110 Last data filed at 01/15/2024 0016 Gross per 24 hour  Intake 1422.94 ml  Output --  Net 1422.94 ml   Filed  Weights   01/14/24 2001  Weight: 99 kg    Examination: General:  ill appearing male in NAD HEENT: MM pink/dry Neuro: Aox3; MAE CV: s1s2, RRR, no m/r/g PULM:  dim clear BS bilaterally; room air GI: soft, bsx4 active  Extremities: warm/dry, right tunneled hd w/ no erythema/drainage; ble edema w/ some warmth on palpation and pictures of wounds below  Left lateral leg wound  Right medial leg wound  Right lateral leg wound    Resolved problem list   Assessment and Plan    Septic shock: presumed 2/2 to b/l leg wounds poa; possible pna? Plan: -cont levo for map goal >65 -gentle iv fluids -midodrine  -trend LA -zosyn /vanc -follow cultures; ua pending (patient states he does make urine); check rvp and send expectorated sputum if able to obtain -x-ray BLE -woc for ble leg wounds and sacral wound poa  Acute encephalopathy: likely from sepsis and hypotension -improved w/ improving bp -ct head no acute abnormality Plan: -limit sedating meds -treat for sepsis as above -hold home gabapentin , norco/vicodin, zanaflex , and trazodone   ESRD on HD MWF Plan: -will need nephro consult in am -Trend BMP / urinary output -Replace electrolytes as indicated -Avoid nephrotoxic agents, ensure adequate renal perfusion  T2DM Plan: -ssi and cbg monitoring -a1c  Chronic A-fib: not currently on eliquis  due to previous ABLA from hematuria Diastolic CHF HTN HLD Plan: -tele monitoring -not currently on anti-htn  meds or statin -hold diuretics for now -daily weights; strict I/o's  Anemia of ESRD Plan: -trend cbc  Best Practice (right click and Reselect all SmartList Selections daily)   Diet/type: clear liquids DVT prophylaxis: prophylactic heparin   GI prophylaxis: N/A Lines: N/A Foley:  N/A Code Status:  full code Last date of multidisciplinary goals of care discussion [9/12 patient and wife updated at bedside]   Labs   CBC: Recent Labs  Lab 01/14/24 2018 01/14/24 2031   WBC 47.4*  --   NEUTROABS 44.6*  --   HGB 8.1* 9.5*  HCT 26.1* 28.0*  MCV 92.6  --   PLT 229  --     Basic Metabolic Panel: Recent Labs  Lab 01/14/24 2018 01/14/24 2031  NA 134* 133*  K 4.6 4.7  CL 96* 96*  CO2 23  --   GLUCOSE 160* 160*  BUN 27* 31*  CREATININE 4.07* 4.10*  CALCIUM  8.7*  --    GFR: Estimated Creatinine Clearance: 19.8 mL/min (A) (by C-G formula based on SCr of 4.1 mg/dL (H)). Recent Labs  Lab 01/14/24 2018 01/14/24 2022 01/14/24 2031 01/14/24 2239  WBC 47.4*  --   --   --   LATICACIDVEN  --  3.6* 3.0* 2.7*    Liver Function Tests: Recent Labs  Lab 01/14/24 2018  AST 20  ALT 11  ALKPHOS 105  BILITOT 1.7*  PROT 6.4*  ALBUMIN  2.4*   No results for input(s): LIPASE, AMYLASE in the last 168 hours. No results for input(s): AMMONIA in the last 168 hours.  ABG    Component Value Date/Time   TCO2 24 01/14/2024 2031     Coagulation Profile: Recent Labs  Lab 01/14/24 2018  INR 1.3*    Cardiac Enzymes: No results for input(s): CKTOTAL, CKMB, CKMBINDEX, TROPONINI in the last 168 hours.  HbA1C: Hgb A1c MFr Bld  Date/Time Value Ref Range Status  06/26/2023 04:48 PM 6.2 (H) 4.8 - 5.6 % Final    Comment:    (NOTE) Pre diabetes:          5.7%-6.4%  Diabetes:              >6.4%  Glycemic control for   <7.0% adults with diabetes     CBG: No results for input(s): GLUCAP in the last 168 hours.  Review of Systems:   Review of Systems  Constitutional:  Negative for fever.  Respiratory:  Negative for cough and shortness of breath.   Cardiovascular:  Negative for chest pain.  Gastrointestinal:  Negative for abdominal pain, diarrhea, nausea and vomiting.     Past Medical History:  He,  has a past medical history of Arthritis, Atrial fibrillation (HCC), CKD (chronic kidney disease) stage 3, GFR 30-59 ml/min (HCC), Diabetes mellitus without complication (HCC), Dysrhythmia, Hyperlipidemia, and Hypertension.   Surgical  History:   Past Surgical History:  Procedure Laterality Date   AV FISTULA PLACEMENT Left 10/09/2023   Procedure: ARTERIOVENOUS (AV) FISTULA CREATION, FIRST STAGE BASILIC;  Surgeon: Pearline Norman RAMAN, MD;  Location: [facility] OR;  Service: Vascular;  Laterality: Left;  LEFT ARM AVF VERSUS AVG   CYSTOSCOPY W/ URETERAL STENT PLACEMENT Right 08/05/2023   Procedure: CYSTOSCOPY, WITH RETROGRADE PYELOGRAM AND URETERAL STENT INSERTION;  Surgeon: Roseann Adine PARAS., MD;  Location: [facility] OR;  Service: Urology;  Laterality: Right;   CYSTOSCOPY/URETEROSCOPY/HOLMIUM LASER/STENT PLACEMENT Left 07/24/2023   Procedure: CYSTOSCOPY/URETEROSCOPY/HOLMIUM LASER/ STENT PLACEMENT/REMOVAL OF NEPHROSTOMY TUBE;  Surgeon: Nieves Cough, MD;  Location: [facility] ORS;  Service: Urology;  Laterality: Left;  90 MINUTE CASE   CYSTOSCOPY/URETEROSCOPY/HOLMIUM LASER/STENT PLACEMENT Bilateral 08/18/2023   Procedure: CYSTOSCOPY/URETEROSCOPY/HOLMIUM LASER/STENT PLACEMENT;  Surgeon: Nieves Cough, MD;  Location: [facility] OR;  Service: Urology;  Laterality: Bilateral;   INSERTION OF DIALYSIS CATHETER Right 10/09/2023   Procedure: INSERTION OF DIALYSIS CATHETER, USING PALINDROME CATHETER KIT 19CM;  Surgeon: Pearline Norman RAMAN, MD;  Location: [facility] OR;  Service: Vascular;  Laterality: Right;  TUNNELED DIALYSIS CATHETER   IR NEPHROSTOMY PLACEMENT LEFT  06/03/2023   KIDNEY SURGERY     TUNNELLED CATHETER EXCHANGE N/A 11/20/2023   Procedure: TUNNELLED CATHETER EXCHANGE;  Surgeon: Sheree Penne Bruckner, MD;  Location: [facility];  Service: Cardiovascular;  Laterality: N/A;     Social History:   reports that he has never smoked. He has never used smokeless tobacco. He reports that he does not drink alcohol  and does not use drugs.   Family History:  His family history includes Dementia in his mother.   Allergies No Known Allergies   Home Medications  Prior to Admission medications   Medication Sig Start Date End Date Taking? Authorizing Provider   acetaminophen  (TYLENOL ) 325 MG tablet Take 2 tablets (650 mg total) by mouth every 4 (four) hours as needed for mild pain (pain score 1-3) or fever. 08/05/23  Yes Angiulli, Toribio PARAS, PA-C  furosemide  (LASIX ) 80 MG tablet Take 1 tablet (80 mg total) by mouth See admin instructions. Take 1 tablet (80 mg) by mouth every Tuesday, Thursday, Saturday, and Sunday 10/23/23 10/22/24 Yes Foust, Katy L, NP  gabapentin  (NEURONTIN ) 100 MG capsule Take 100 mg by mouth at bedtime. 12/29/23  Yes [provider]  HYDROcodone -acetaminophen  (NORCO/VICODIN) 5-325 MG tablet Take 1 tablet by mouth every 6 (six) hours as needed for moderate pain (pain score 4-6). 11/12/23  Yes [provider]  iron  polysaccharides (NIFEREX) 150 MG capsule Take 1 capsule (150 mg total) by mouth daily. 07/11/23  Yes Vann, Jessica U, DO  LANTUS SOLOSTAR 100 UNIT/ML Solostar Pen Inject 3-4 Units into the skin daily as needed (for hyperglycemia). 11/02/23  Yes [provider]  multivitamin (RENA-VIT) TABS tablet Take 1 tablet by mouth at bedtime. 08/05/23  Yes Angiulli, Daniel J, PA-C  Nystatin (GERHARDT'S BUTT CREAM) CREA Apply 1 Application topically 2 (two) times daily. 08/20/23  Yes Angiulli, Toribio PARAS, PA-C  psyllium (REGULOID) 0.52 g capsule Take 4 capsules by mouth daily.   Yes [provider]  silver  sulfADIAZINE  (SILVADENE ) 1 % cream Apply 1 Application topically daily. Apply to legs 11/21/23 07/10/24 Yes [provider]  tamsulosin  (FLOMAX ) 0.4 MG CAPS capsule Take 1 capsule (0.4 mg total) by mouth daily after supper. Patient taking differently: Take 0.4 mg by mouth daily after breakfast. 10/16/23  Yes Tobie Yetta HERO, MD  tiZANidine  (ZANAFLEX ) 4 MG tablet Take 1 tablet (4 mg total) by mouth 2 (two) times daily as needed for muscle spasms. 08/05/23  Yes Angiulli, Toribio PARAS, PA-C  traZODone  (DESYREL ) 50 MG tablet Take 1 tablet (50 mg total) by mouth at bedtime as needed for sleep. 10/16/23  Yes Tobie Yetta HERO,  MD     Critical care time: 45 minutes     JD Emilio RIGGERS [facility] 01/15/2024, 1:10 AM  Please see [email or web address] for pager details.  From 7A-7P if no response, please call [phone]. After hours, please call ELink [phone].

## 2024-01-15 NOTE — Consult Note (Signed)
 [facility]    Date of Admission:  01/14/2024   Total days of inpatient antibiotics         Reason for Consult: 1    Principal Problem:   Septic shock [facility])   Assessment: 73 year old male with pertinent history of ESRD on HD via right IJ, diabetes type 2, chronic A-fib, diastolic heart failure, hyperal anemia admitted for septic shock found to have #MSSA bacteremia #Lower extremity venous ulcers #ESRD on HD via right IJ - Patient presented altered mental status WBC 47.4 K.  On arrival temperature 100.3.  Blood cultures grew 2 out of 2 MSSA -Started on cefazolin  Recommendations:  - Engage wound care for worsening wounds - TTE, will need TEE - Recommend HD catheter removal - Repeat blood cultures to ensure clearance - Standard precautions - Communicated plan to primary   Microbiology:   Antibiotics: Vancomycin  and pip-tazo 9/11 Cefazolin  9/12  Cultures: Blood 9/11 2/2 MSSA Urine  Other   HPI: Calvin Schultz is a 73 y.o. male with history of ESRD on HD via right IJ, hypertension, BPH, diabetes type 2, A-fib, CHF, [place], GERD presents with altered mental status.  Had a temp of 100.3 and hypotension.  Patient is followed by wound care for leg venous stasis ulcer at [facility].  Admitted for septic shock/acute encephalopathy.  Blood cultures grew MSSA.  ID was on consulted.  Review of Systems: Review of Systems  All other systems reviewed and are negative.   Past Medical History:  Diagnosis Date   Arthritis    Atrial fibrillation (HCC)    CKD (chronic kidney disease) stage 3, GFR 30-59 ml/min (HCC)    Diabetes mellitus without complication (HCC)    Dysrhythmia    Hyperlipidemia    Hypertension     Social History   Tobacco Use   Smoking status: Never   Smokeless tobacco: Never  Vaping Use   Vaping status: Never Used  Substance Use Topics   Alcohol  use: Never   Drug use: Never    Family History  Problem  Relation Age of Onset   Dementia Mother    Scheduled Meds:  Chlorhexidine  Gluconate Cloth  6 each Topical Daily   Chlorhexidine  Gluconate Cloth  6 each Topical Q0600   gabapentin   100 mg Oral QHS   heparin   5,000 Units Subcutaneous Q8H   insulin  aspart  0-9 Units Subcutaneous Q4H   midodrine   10 mg Oral Q8H   silver  sulfADIAZINE    Topical Daily   Continuous Infusions:   ceFAZolin  (ANCEF ) IV     norepinephrine  (LEVOPHED ) Adult infusion     PRN Meds:.docusate sodium , mouth rinse, oxyCODONE -acetaminophen , polyethylene glycol, tiZANidine , traZODone  No Known Allergies  OBJECTIVE: Blood pressure 114/63, pulse 66, temperature 98.3 F (36.8 C), temperature source Axillary, resp. rate 15, height 5' 8 (1.727 m), weight 102.5 kg, SpO2 91%.  Physical Exam Constitutional:      General: He is not in acute distress.    Appearance: He is normal weight. He is not toxic-appearing.  HENT:     Head: Normocephalic and atraumatic.     Right Ear: External ear normal.     Left Ear: External ear normal.     Nose: No congestion or rhinorrhea.     Mouth/Throat:     Mouth: Mucous membranes are moist.     Pharynx: Oropharynx is clear.  Eyes:     Extraocular Movements: Extraocular movements intact.  Conjunctiva/sclera: Conjunctivae normal.     Pupils: Pupils are equal, round, and reactive to light.  Cardiovascular:     Rate and Rhythm: Normal rate and regular rhythm.     Heart sounds: No murmur heard.    No friction rub. No gallop.  Pulmonary:     Effort: Pulmonary effort is normal.     Breath sounds: Normal breath sounds.  Abdominal:     General: Abdomen is flat. Bowel sounds are normal.     Palpations: Abdomen is soft.  Musculoskeletal:        General: No swelling.     Cervical back: Normal range of motion and neck supple.     Comments: Le wounds  Skin:    General: Skin is warm and dry.  Neurological:     General: No focal deficit present.     Mental Status: He is oriented to person,  place, and time.  Psychiatric:        Mood and Affect: Mood normal.     Lab Results Lab Results  Component Value Date   WBC 40.5 (H) 01/15/2024   HGB 7.9 (L) 01/15/2024   HCT 25.2 (L) 01/15/2024   MCV 92.6 01/15/2024   PLT 181 01/15/2024    Lab Results  Component Value Date   CREATININE 4.38 (H) 01/15/2024   BUN 31 (H) 01/15/2024   NA 132 (L) 01/15/2024   K 5.5 (H) 01/15/2024   CL 94 (L) 01/15/2024   CO2 16 (L) 01/15/2024    Lab Results  Component Value Date   ALT 11 01/14/2024   AST 20 01/14/2024   ALKPHOS 105 01/14/2024   BILITOT 1.7 (H) 01/14/2024       Loney Stank, MD [facility] 01/15/2024, 12:31 PM   Evaluation of this patient requires complex antimicrobial therapy evaluation and counseling + isolation needs for disease transmission risk assessment and mitigation

## 2024-01-15 NOTE — TOC Initial Note (Signed)
 Transition of Care [facility]) - Initial/Assessment Note    Patient Details  Name: Calvin Schultz MRN: [id number] Date of Birth: October 15, 1950  Transition [facility]) CM/SW Contact:    Calvin Schultz, LCSWA Phone Number: 01/15/2024, 3:13 PM  Clinical Narrative:         3:13 PM CSW introduced self and role to patient. MSW intern, Calvin Schultz, and patient's spouse, Calvin Schultz, were also present. Patient consented CSW to speak to and in front of other guests. Patient confirmed he resides at home with spouse who can provide transportation at discharge but also expressed interest in ambulance transportation. Patient confirmed he has HH RN from Calvin Schultz assist at home twice a week. CSW informed patient and patient's spouse of therapy's recommendation of patient discharging home with HH. Patient was agreeable with recommendation and using [facility] for PT/OT. Patient confirmed his residential address, insurances and stated that he has a new PCP (Calvin Schultz) in [place]. Patient confirmed SNF history with [facility]. Patient confirmed DME (cane, hospital bed, manual wheelchair, slide board, hoyer lift) history (per chart review, DME history is with [facility]). Patient requested rolling walker for home. Per chart review, patient's preferred pharmacy's are [facility] and [phone]. CSW informed RNCM of patient's HH needs. TOC will continue to follow and be available to assist.   Expected Discharge Plan: Home w Home Health Services Barriers to Discharge: Continued Medical Work up   Patient Goals and CMS Choice Patient states their goals for this hospitalization and ongoing recovery are:: to return home with [facility]          Expected Discharge Plan and Services   Discharge Planning Services: CM Consult Post Acute Care Choice: Home Health, Durable Medical Equipment Living arrangements for the past 2 months: Single Family Home                                      Prior Living  Arrangements/Services Living arrangements for the past 2 months: Single Family Home Lives with:: Spouse Patient language and need for interpreter reviewed:: Yes Do you feel safe going back to the place where you live?: Yes      Need for Family Participation in Patient Care: No (Comment) Care giver support system in place?: Yes (comment) Current home services: DME, Home RN Criminal Activity/Legal Involvement Pertinent to Current Situation/Hospitalization: No - Comment as needed  Activities of Daily Living   ADL Screening (condition at time of admission) Independently performs ADLs?: No Does the patient have a NEW difficulty with bathing/dressing/toileting/self-feeding that is expected to last >3 days?: No Does the patient have a NEW difficulty with getting in/out of bed, walking, or climbing stairs that is expected to last >3 days?: Yes (Initiates electronic notice to provider for possible PT consult) Does the patient have a NEW difficulty with communication that is expected to last >3 days?: No Is the patient deaf or have difficulty hearing?: No Does the patient have difficulty seeing, even when wearing glasses/contacts?: No Does the patient have difficulty concentrating, remembering, or making decisions?: No  Permission Sought/Granted Permission sought to share information with : Family Supports Permission granted to share information with : Yes, Verbal Permission Granted  Share Information with NAME: Calvin Schultz  Permission granted to share info w AGENCY: [facility]  Permission granted to share info w Relationship: Spouse  Permission granted to share info w Contact Information: [phone]  Emotional Assessment Appearance:: Appears stated age Attitude/Demeanor/Rapport: Engaged Affect (typically observed): Accepting, Adaptable, Appropriate, Calm, Stable, Pleasant Orientation: : Oriented to Place, Oriented to Self, Oriented to  Time, Oriented to Situation Alcohol  / Substance Use:  Not Applicable Psych Involvement: No (comment)  Admission diagnosis:  Septic shock (HCC) [A41.9, R65.21] Patient Active Problem List   Diagnosis Date Noted   Septic shock (HCC) 01/15/2024   ESRD on hemodialysis (HCC) 01/15/2024   Chronic kidney disease (CKD), stage V (HCC) 10/02/2023   Anasarca 10/01/2023   Malnutrition of moderate degree 08/17/2023   Longstanding persistent atrial fibrillation (HCC) 08/14/2023   Typical atrial flutter (HCC) 08/14/2023   Acute blood loss anemia 08/14/2023   Gross hematuria 08/14/2023   Hypokalemia 08/11/2023   Hypomagnesemia 08/11/2023   Depression with anxiety 08/10/2023   Acute cystitis with hematuria 08/07/2023   C. difficile diarrhea 08/07/2023   Right ureteral calculus 08/05/2023   Pressure injury of skin 08/05/2023   Leukocytosis 08/05/2023   History of Clostridium difficile colitis 08/05/2023   Controlled diabetes mellitus type 2 with complications (HCC) 08/05/2023   Debility 07/10/2023   Clostridium difficile colitis 07/03/2023   Acute renal failure superimposed on chronic kidney disease (HCC) 06/26/2023   Elevated PSA 05/31/2023   Nephrolithiasis 05/31/2023   Bilateral lower extremity edema 05/31/2023   Chronic atrial fibrillation (HCC) 05/31/2023   Normocytic anemia 05/31/2023   Metabolic acidosis 05/31/2023   Abnormal urinalysis 05/31/2023   Proteinuria 05/31/2023   Acute kidney injury superimposed on chronic kidney disease (HCC) 05/30/2023   PCP:  Leila Lucie LABOR, MD Pharmacy:   [phone] [address] [place], [place] - [address] [place] [address] Phone: [phone] Fax: [phone]  [facility] [address] [place] [address] Phone: [phone] Fax: [phone]     Social Drivers of Health (SDOH) Social History: SDOH Screenings   Food Insecurity: No Food Insecurity (01/15/2024)  Housing: Low Risk  (01/15/2024)  Transportation Needs: No  Transportation Needs (01/15/2024)  Utilities: Not At Risk (01/15/2024)  Social Connections: Unknown (01/15/2024)  Tobacco Use: Low Risk  (01/14/2024)   SDOH Interventions:     Readmission Risk Interventions    07/10/2023   11:21 AM 06/27/2023    1:00 PM  Readmission Risk Prevention Plan  Transportation Screening Complete Complete  PCP or Specialist Appt within 5-7 Days  Complete  PCP or Specialist Appt within 3-5 Days Complete   Home Care Screening  Complete  Medication Review (RN CM)  Complete  HRI or Home Care Consult Complete   Social Work Consult for Recovery Care Planning/Counseling Complete   Palliative Care Screening Not Applicable   Medication Review Oceanographer) Referral to Pharmacy

## 2024-01-15 NOTE — Progress Notes (Addendum)
 PHARMACY - PHYSICIAN COMMUNICATION CRITICAL VALUE ALERT - BLOOD CULTURE IDENTIFICATION (BCID)  Calvin Schultz is an 73 y.o. male who presented to [facility] on 01/14/2024 with a chief complaint of AMS, fever.  Assessment:  GPC 4/4 BOTTLES, BCID = Staph auerus - MSSA (include suspected source if known)  Name of physician (or Provider) Contacted: Rodericks Fobbs, PharmD (who relayed message to CCM team) and Dr. Dennise for automatic ID consult   Current antibiotics: Zosyn , vancomycin   Changes to prescribed antibiotics recommended:  Cefazolin  1g q24h   Results for orders placed or performed during the hospital encounter of 01/14/24  Blood Culture ID Panel (Reflexed) (Collected: 01/14/2024  8:23 PM)  Result Value Ref Range   Enterococcus faecalis NOT DETECTED NOT DETECTED   Enterococcus Faecium NOT DETECTED NOT DETECTED   Listeria monocytogenes NOT DETECTED NOT DETECTED   Staphylococcus species DETECTED (A) NOT DETECTED   Staphylococcus aureus (BCID) DETECTED (A) NOT DETECTED   Staphylococcus epidermidis NOT DETECTED NOT DETECTED   Staphylococcus lugdunensis NOT DETECTED NOT DETECTED   Streptococcus species NOT DETECTED NOT DETECTED   Streptococcus agalactiae NOT DETECTED NOT DETECTED   Streptococcus pneumoniae NOT DETECTED NOT DETECTED   Streptococcus pyogenes NOT DETECTED NOT DETECTED   A.calcoaceticus-baumannii NOT DETECTED NOT DETECTED   Bacteroides fragilis NOT DETECTED NOT DETECTED   Enterobacterales NOT DETECTED NOT DETECTED   Enterobacter cloacae complex NOT DETECTED NOT DETECTED   Escherichia coli NOT DETECTED NOT DETECTED   Klebsiella aerogenes NOT DETECTED NOT DETECTED   Klebsiella oxytoca NOT DETECTED NOT DETECTED   Klebsiella pneumoniae NOT DETECTED NOT DETECTED   Proteus species NOT DETECTED NOT DETECTED   Salmonella species NOT DETECTED NOT DETECTED   Serratia marcescens NOT DETECTED NOT DETECTED   Haemophilus influenzae NOT DETECTED NOT DETECTED   Neisseria meningitidis  NOT DETECTED NOT DETECTED   Pseudomonas aeruginosa NOT DETECTED NOT DETECTED   Stenotrophomonas maltophilia NOT DETECTED NOT DETECTED   Candida albicans NOT DETECTED NOT DETECTED   Candida auris NOT DETECTED NOT DETECTED   Candida glabrata NOT DETECTED NOT DETECTED   Candida krusei NOT DETECTED NOT DETECTED   Candida parapsilosis NOT DETECTED NOT DETECTED   Candida tropicalis NOT DETECTED NOT DETECTED   Cryptococcus neoformans/gattii NOT DETECTED NOT DETECTED   Meth resistant mecA/C and MREJ NOT DETECTED NOT DETECTED    Feliciano Close, PharmD PGY2 Infectious Diseases Pharmacy Resident  01/15/2024 11:25 AM

## 2024-01-15 NOTE — Progress Notes (Signed)
 eLink Physician-Brief Progress Note Patient Name: Calvin Schultz DOB: [birth year] MRN: [id number]   Date of Service  01/15/2024  HPI/Events of Note  Patient admitted to the ICU with septic shock secondary to infected wounds.  eICU Interventions  New Patient Evaluation.        Drew Herman U Douglass Dunshee 01/15/2024, 2:42 AM

## 2024-01-16 LAB — BASIC METABOLIC PANEL WITH GFR
Anion gap: 15 (ref 5–15)
BUN: 26 mg/dL — ABNORMAL HIGH (ref 8–23)
CO2: 23 mmol/L (ref 22–32)
Calcium: 8.6 mg/dL — ABNORMAL LOW (ref 8.9–10.3)
Chloride: 96 mmol/L — ABNORMAL LOW (ref 98–111)
Creatinine, Ser: 3.55 mg/dL — ABNORMAL HIGH (ref 0.61–1.24)
GFR, Estimated: 18 mL/min — ABNORMAL LOW (ref >60)
Glucose, Bld: 91 mg/dL (ref 70–99)
Potassium: 4.2 mmol/L (ref 3.5–5.1)
Sodium: 134 mmol/L — ABNORMAL LOW (ref 135–145)

## 2024-01-16 LAB — GLUCOSE, CAPILLARY
Glucose-Capillary: 125 mg/dL — ABNORMAL HIGH (ref 70–99)
Glucose-Capillary: 133 mg/dL — ABNORMAL HIGH (ref 70–99)
Glucose-Capillary: 134 mg/dL — ABNORMAL HIGH (ref 70–99)
Glucose-Capillary: 83 mg/dL (ref 70–99)
Glucose-Capillary: 88 mg/dL (ref 70–99)
Glucose-Capillary: 92 mg/dL (ref 70–99)
Glucose-Capillary: 92 mg/dL (ref 70–99)

## 2024-01-16 LAB — CBC
HCT: 28.9 % — ABNORMAL LOW (ref 39.0–52.0)
Hemoglobin: 8.9 g/dL — ABNORMAL LOW (ref 13.0–17.0)
MCH: 28.3 pg (ref 26.0–34.0)
MCHC: 30.8 g/dL (ref 30.0–36.0)
MCV: 92 fL (ref 80.0–100.0)
Platelets: 174 K/uL (ref 150–400)
RBC: 3.14 MIL/uL — ABNORMAL LOW (ref 4.22–5.81)
RDW: 16.8 % — ABNORMAL HIGH (ref 11.5–15.5)
WBC: 24.7 K/uL — ABNORMAL HIGH (ref 4.0–10.5)
nRBC: 0 % (ref 0.0–0.2)

## 2024-01-16 LAB — HEPATITIS B SURFACE ANTIBODY, QUANTITATIVE: Hep B S AB Quant (Post): <3.5 m[IU]/mL — ABNORMAL LOW

## 2024-01-16 MED ORDER — FUROSEMIDE 10 MG/ML IJ SOLN
80.0000 mg | Freq: Two times a day (BID) | INTRAMUSCULAR | Status: DC
  Administered 2024-01-16 – 2024-01-20 (×10): 80 mg via INTRAVENOUS
  Filled 2024-01-16 (×10): qty 8

## 2024-01-16 MED ORDER — ACETAMINOPHEN 325 MG PO TABS
650.0000 mg | ORAL_TABLET | ORAL | Status: DC | PRN
  Administered 2024-01-19 – 2024-01-21 (×3): 650 mg via ORAL
  Filled 2024-01-16 (×3): qty 2

## 2024-01-16 MED ORDER — HEPARIN SODIUM (PORCINE) 1000 UNIT/ML IJ SOLN
INTRAMUSCULAR | Status: AC
Start: 2024-01-16 — End: 2024-01-16
  Filled 2024-01-16: qty 4

## 2024-01-16 MED ORDER — HEPARIN SODIUM (PORCINE) 1000 UNIT/ML IJ SOLN
3200.0000 [IU] | Freq: Once | INTRAMUSCULAR | Status: AC
Start: 2024-01-16 — End: 2024-01-16
  Administered 2024-01-16: 3200 [IU]

## 2024-01-16 MED ORDER — DICLOFENAC SODIUM 1 % EX GEL
2.0000 g | Freq: Two times a day (BID) | CUTANEOUS | Status: DC | PRN
  Administered 2024-01-16 – 2024-01-18 (×2): 2 g via TOPICAL
  Filled 2024-01-16: qty 100

## 2024-01-16 MED ORDER — HYDROCODONE-ACETAMINOPHEN 5-325 MG PO TABS
1.0000 | ORAL_TABLET | Freq: Four times a day (QID) | ORAL | Status: DC | PRN
  Administered 2024-01-16 – 2024-01-20 (×10): 1 via ORAL
  Filled 2024-01-16 (×10): qty 1

## 2024-01-16 MED ORDER — CHLORHEXIDINE GLUCONATE CLOTH 2 % EX PADS
6.0000 | MEDICATED_PAD | Freq: Every day | CUTANEOUS | Status: DC
  Administered 2024-01-16 – 2024-01-23 (×8): 6 via TOPICAL

## 2024-01-16 NOTE — Progress Notes (Addendum)
 Bedside Hemodialysis treatment Alert and oriented.  Informed consent signed and in chart.   TX duration:2 hours, Patient became very restless and agitated. Stating pain around and 8 in back, neck and right leg, c/o needing to vomited. Pain med given per ICU nurse with relief..System with multiple alarms and clotting, systems changed,  Attempted to restart patient, Catheter aspirated with difficulty both lumens, flushed, upon connection treatment would not run, Low arterial pressures. Treatment terminated, 2 hours total.   Patient tolerated well. (See note)  Transported back to the room (Bedside)  Resting quietly  Hand-off given to patient's nurse. Inocente Filler, RN  Access used: Right Chest Dialysis catheter  Access issues: Aspirate with difficulty 2 hours into treatment  Total UF removed: 900 ml  Medication(s) given: per shift nurse (see MAR)  Post HD VS: [id number] B/P 124/69  Post HD weight: 101.6KG  Neville Seip, RN [facility]  Provider notified re: Patient unable to run full treatment due to access failure   01/16/24 0130  Vitals  Temp 98.4 F (36.9 C)  Temp Source Axillary  BP 124/69  MAP (mmHg) 85  BP Location Left Leg  BP Method Automatic  Patient Position (if appropriate) Lying  Pulse Rate 61  Pulse Rate Source Monitor  ECG Heart Rate 63  Resp 17  Oxygen Therapy  SpO2 100 %  O2 Device Nasal Cannula  O2 Flow Rate (L/min) 2 L/min  Patient Activity (if Appropriate) In bed  Pulse Oximetry Type Continuous  During Treatment Monitoring  Blood Flow Rate (mL/min) 0 mL/min  Arterial Pressure (mmHg) -499.98 mmHg  Venous Pressure (mmHg) 234.13 mmHg  TMP (mmHg) -3.63 mmHg  Ultrafiltration Rate (mL/min) 872 mL/min  Dialysate Flow Rate (mL/min) 300 ml/min  Dialysate Potassium Concentration 2  Dialysate Calcium  Concentration 2.5  Duration of HD Treatment -hour(s) 2 hour(s)  Cumulative Fluid Removed (mL) per Treatment  594.29  Intra-Hemodialysis  Comments See progress note  Post Treatment  Dialyzer Clearance Lightly streaked  Liters Processed 41  Fluid Removed (mL) 900 mL  Tolerated HD Treatment No (Comment)  Hemodialysis Catheter Right Internal jugular Double lumen Permanent (Tunneled)  Placement Date/Time: 11/20/23 1019   Placed prior to admission: No  Serial / Lot #: [id number]  Expiration Date: 07/02/28  Time Out: Correct patient;Correct procedure;Correct site  Maximum sterile barrier precautions: Hand hygiene;Large sterile sheet;C...  Site Condition No complications  Blue Lumen Status Heparin  locked;Antimicrobial dead end cap;Flushed  Red Lumen Status Flushed;Antimicrobial dead end cap;Heparin  locked  Purple Lumen Status N/A  Catheter fill solution Heparin  1000 units/ml  Catheter fill volume (Arterial) 1.6 cc  Catheter fill volume (Venous) 1.6  Dressing Type Transparent  Dressing Status Antimicrobial disc/dressing in place;Clean, Dry, Intact  Interventions New dressing  Dressing Change Due 01/22/24  Post treatment catheter status Capped and Clamped    [facility]

## 2024-01-16 NOTE — Progress Notes (Signed)
 D/w vascular - to remove permacath in am  Calvin Schultz V. Jude MD

## 2024-01-16 NOTE — Progress Notes (Signed)
 Patient became very restless and agitated, stated he was in pain about an 8 in neck and back. and leg. Feeling he had to throw up. Unable to continue treatment due to continuous alarms of machine. Eventually, clotted off system. System changed, pain medication administered per floor RN. Plan to restart and complete treatment once patient has settled.      01/16/24 0030  Vitals  Pulse Rate 61  Resp (!) 27  BP (!) 158/73  SpO2 98 %  During Treatment Monitoring  Blood Flow Rate (mL/min) 0 mL/min  Arterial Pressure (mmHg) -4.85 mmHg  Venous Pressure (mmHg) 57.77 mmHg  TMP (mmHg) 23.63 mmHg  Ultrafiltration Rate (mL/min) 0 mL/min  Dialysate Flow Rate (mL/min) 300 ml/min  Duration of HD Treatment -hour(s) 2 hour(s)  Cumulative Fluid Removed (mL) per Treatment  903.67  Intra-Hemodialysis Comments See progress note

## 2024-01-16 NOTE — Progress Notes (Signed)
 NAME:  Calvin Schultz, MRN:  [id number], DOB:  1950/09/06, LOS: 1 ADMISSION DATE:  01/14/2024, CONSULTATION DATE:  9/12 REFERRING MD:  Dr. Dub, CHIEF COMPLAINT:  septic shock   History of Present Illness:  Patient is a 73 yo M w/ pertinent PMH ESRD on HD MWF, T2DM, chronic A-fib, diastolic CHF, HTN, HLD presents to [facility] on 9/11 with septic shock.  Patient seen outpt w/ [facility] for b/l leg venous stasis ulcer for debridement and wound care. Had dvt us  and abi of ble on 9/11 which were normal. Per patient's wife patient been more altered and poor po intake on 9/11 prompting her to come to [facility] ED for further eval. On arrival febrile 100.3 F and hypotensive given gentle iv fluids. Wbc 47 and LA 3.6. UA pending. CXR w/ b/l infiltrate vs. atelectasis. B/l leg wound present. Patient denies any CP, sob, abd pain, n/v/d, urinary symptoms. Cultures obtained and started on broad spectrum abx. Mental some improved w/ improving bp. CT head negative for acute abnormality. Despite iv fluids patient remained hypotensive started on levo. Pccm consulted for icu admission.   Pertinent  Medical History   Past Medical History:  Diagnosis Date   Arthritis    Atrial fibrillation (HCC)    CKD (chronic kidney disease) stage 3, GFR 30-59 ml/min (HCC)    Diabetes mellitus without complication (HCC)    Dysrhythmia    Hyperlipidemia    Hypertension     Significant Hospital Events: Including procedures, antibiotic start and stop dates in addition to other pertinent events   9/12 admit w/ septic shock on levo  Interim History / Subjective:  Off pressors since yesterday Complains of pain in shoulders and legs Afebrile Underwent dialysis for 2 hours , 900 cc removed  Objective    Blood pressure 107/70, pulse 62, temperature (!) 97.3 F (36.3 C), temperature source Axillary, resp. rate 20, height 5' 8 (1.727 m), weight 101 kg, SpO2 97%.        Intake/Output Summary (Last 24 hours) at 01/16/2024  0903 Last data filed at 01/16/2024 0600 Gross per 24 hour  Intake 682.28 ml  Output 900 ml  Net -217.72 ml   Filed Weights   01/15/24 0215 01/15/24 2205 01/16/24 0410  Weight: 102.5 kg 102.5 kg 101 kg    Examination: General:  ill appearing male in NAD HEENT: MM pink/dry , right tunneled HD catheter, no tenderness, exit site appears okay Neuro: Alert, interactive, nonfocal CV: s1s2, RRR, no m/r/g PULM: Clear bilateral lungs, no accessory muscle use GI: soft, bsx4 active  Extremities: warm/dry,  bilateral lower extremity wrapped. Multiple chronic ecchymosis on bilateral arms. Images below from initial presentation: Left lateral leg wound    Right medial leg wound  Right lateral leg wound    Labs: Leukocytosis decreased to 25K, stable anemia, potassium normalized  Micro: RVP negative Blood cultures 4/4 MSSA  Resolved problem list   Assessment and Plan    Septic shock MSSA bacteremia Improved, off pressors, WBC decreasing, lactic acidosis has resolved.  Septic shock secondary to bilateral wound, blood cultures growing MSSA.  X-rays of bilateral foot were negative for osteomyelitis. Soft blood pressure not reliable, left lower extremity reads 110-120 systolic while arm reads 90 Plan: -ID consulted, recommend removal of tunneled HD catheter, IR has been consulted - Will need TTE - De-escalate antibiotics to cefazolin   Acute encephalopathy: likely from sepsis and hypotension Resolved, CT negative for acute abnormality. Plan: - Restarted gabapentin , norco/vicodin, zanaflex , and trazodone  - Voltaren   gel as needed for shoulders pain  ESRD on HD MWF Consulted nephrology,  will be dialyzed today.   Hyperkalemia resolved  T2DM A1c 5.6, BG at goal. Plan: - SSI  Chronic A-fib:  Rate controlled W/o medicine, not on anticoagulation due to previous acute blood loss from hematuria.  Diastolic CHF HTN HLD Plan: -tele monitoring -not currently on anti-htn meds or  statin -hold diuretics for now -daily weights; strict I/o's  Anemia of ESRD Hb stable  Transfer to medical telemetry and to [facility] 9/14  Best Practice (right click and Reselect all SmartList Selections daily)   Diet/type: Full diet DVT prophylaxis: prophylactic heparin   GI prophylaxis: N/A Lines: N/A Foley:  N/A Code Status:  full code Last date of multidisciplinary goals of care discussion [9/12 patient and wife updated at bedside]   Labs   CBC: Recent Labs  Lab 01/14/24 2018 01/14/24 2031 01/15/24 0858 01/16/24 0303  WBC 47.4*  --  40.5* 24.7*  NEUTROABS 44.6*  --   --   --   HGB 8.1* 9.5* 7.9* 8.9*  HCT 26.1* 28.0* 25.2* 28.9*  MCV 92.6  --  92.6 92.0  PLT 229  --  181 174    Basic Metabolic Panel: Recent Labs  Lab 01/14/24 2018 01/14/24 2031 01/15/24 0659 01/16/24 0303  NA 134* 133* 132* 134*  K 4.6 4.7 5.5* 4.2  CL 96* 96* 94* 96*  CO2 23  --  16* 23  GLUCOSE 160* 160* 114* 91  BUN 27* 31* 31* 26*  CREATININE 4.07* 4.10* 4.38* 3.55*  CALCIUM  8.7*  --  8.6* 8.6*  MG  --   --  1.7  --    GFR: Estimated Creatinine Clearance: 22.3 mL/min (A) (by C-G formula based on SCr of 3.55 mg/dL (H)). Recent Labs  Lab 01/14/24 2018 01/14/24 2022 01/14/24 2031 01/14/24 2239 01/15/24 0657 01/15/24 0858 01/16/24 0303  WBC 47.4*  --   --   --   --  40.5* 24.7*  LATICACIDVEN  --  3.6* 3.0* 2.7* 1.9  --   --     Liver Function Tests: Recent Labs  Lab 01/14/24 2018  AST 20  ALT 11  ALKPHOS 105  BILITOT 1.7*  PROT 6.4*  ALBUMIN  2.4*   No results for input(s): LIPASE, AMYLASE in the last 168 hours. No results for input(s): AMMONIA in the last 168 hours.  ABG    Component Value Date/Time   TCO2 24 01/14/2024 2031     Coagulation Profile: Recent Labs  Lab 01/14/24 2018  INR 1.3*    Cardiac Enzymes: No results for input(s): CKTOTAL, CKMB, CKMBINDEX, TROPONINI in the last 168 hours.  HbA1C: Hgb A1c MFr Bld  Date/Time Value Ref  Range Status  01/15/2024 08:58 AM 5.6 4.8 - 5.6 % Final    Comment:    (NOTE) Diagnosis of Diabetes The following HbA1c ranges recommended by the American Diabetes Association (ADA) may be used as an aid in the diagnosis of diabetes mellitus.  Hemoglobin             Suggested A1C NGSP%              Diagnosis  <5.7                   Non Diabetic  5.7-6.4                Pre-Diabetic  >6.4  Diabetic  <7.0                   Glycemic control for                       adults with diabetes.    06/26/2023 04:48 PM 6.2 (H) 4.8 - 5.6 % Final    Comment:    (NOTE) Pre diabetes:          5.7%-6.4%  Diabetes:              >6.4%  Glycemic control for   <7.0% adults with diabetes     CBG: Recent Labs  Lab 01/15/24 1512 01/15/24 1939 01/16/24 0037 01/16/24 0353 01/16/24 0754  GLUCAP 131* 104* 88 92 83     Harden Staff MD. FCCP. [facility] Pager : [phone]  If no response to pager , please call [phone] until 7 pm After 7:00 pm call Elink  [phone]     9:03 AM, 01/16/2024

## 2024-01-16 NOTE — Progress Notes (Signed)
 [facility] Progress Note   Subjective:   Tolerated 2h HD overnight, stopped due to agitation then filter clotted.  He is doing better this AM.   Objective Vitals:   01/16/24 0400 01/16/24 0410 01/16/24 0500 01/16/24 0600  BP: 123/69  126/80 115/88  Pulse: 60  60 60  Resp: 15  14 14   Temp:      TempSrc:      SpO2: 96%  100% 100%  Weight:  101 kg    Height:       Physical Exam General: chronically ill but nontoxic appearing Heart:RRR Lungs:clear Abdomen:soft Extremities:1+ pitting edema, wounds wrapped on LE Dialysis Access: [facility]  Additional Objective Labs: Basic Metabolic Panel: Recent Labs  Lab 01/14/24 2018 01/14/24 2031 01/15/24 0659 01/16/24 0303  NA 134* 133* 132* 134*  K 4.6 4.7 5.5* 4.2  CL 96* 96* 94* 96*  CO2 23  --  16* 23  GLUCOSE 160* 160* 114* 91  BUN 27* 31* 31* 26*  CREATININE 4.07* 4.10* 4.38* 3.55*  CALCIUM  8.7*  --  8.6* 8.6*   Liver Function Tests: Recent Labs  Lab 01/14/24 2018  AST 20  ALT 11  ALKPHOS 105  BILITOT 1.7*  PROT 6.4*  ALBUMIN  2.4*   No results for input(s): LIPASE, AMYLASE in the last 168 hours. CBC: Recent Labs  Lab 01/14/24 2018 01/14/24 2031 01/15/24 0858 01/16/24 0303  WBC 47.4*  --  40.5* 24.7*  NEUTROABS 44.6*  --   --   --   HGB 8.1* 9.5* 7.9* 8.9*  HCT 26.1* 28.0* 25.2* 28.9*  MCV 92.6  --  92.6 92.0  PLT 229  --  181 174   Blood Culture    Component Value Date/Time   SDES BLOOD RIGHT FOREARM 01/14/2024 2023   SPECREQUEST  01/14/2024 2023    BOTTLES DRAWN AEROBIC AND ANAEROBIC Blood Culture adequate volume   CULT GRAM POSITIVE COCCI 01/14/2024 2023   REPTSTATUS PENDING 01/14/2024 2023    Cardiac Enzymes: No results for input(s): CKTOTAL, CKMB, CKMBINDEX, TROPONINI in the last 168 hours. CBG: Recent Labs  Lab 01/15/24 1133 01/15/24 1512 01/15/24 1939 01/16/24 0037 01/16/24 0353  GLUCAP 130* 131* 104* 88 92   Iron  Studies: No results for input(s): IRON ,  TIBC, TRANSFERRIN, FERRITIN in the last 72 hours. @lablastinr3 @ Studies/Results: DG Tibia/Fibula Left Result Date: 01/15/2024 EXAM: 3 VIEW(S) XRAY OF THE LEFT TIBIA AND FIBULA 01/15/2024 02:10:56 AM COMPARISON: None available. CLINICAL HISTORY: Sepsis (HCC) [id number]. sepses FINDINGS: BONES AND JOINTS: No acute fracture. No focal osseous lesion. No joint dislocation. Advanced degenerative changes of the left knee. SOFT TISSUES: Diffuse subcutaneous edema. Atherosclerotic changes throughout visualized arterial segments. IMPRESSION: 1. No acute findings. 2. Advanced degenerative changes of the left knee. Electronically signed by: Norman Gatlin MD 01/15/2024 02:15 AM EDT RP Workstation: [id number]   DG Foot 2 Views Right Result Date: 01/15/2024 CLINICAL DATA:  Sepsis EXAM: RIGHT FOOT - 2 VIEW COMPARISON:  None Available. FINDINGS: No acute bony abnormality. Specifically, no fracture, subluxation, or dislocation. No bone destruction. Soft tissues are intact. Vascular calcifications. No soft tissue gas. IMPRESSION: No acute bony abnormality. Electronically Signed   By: Franky Crease M.D.   On: 01/15/2024 01:54   DG Ankle 2 Views Left Result Date: 01/15/2024 CLINICAL DATA:  Sepsis EXAM: LEFT ANKLE - 2 VIEW COMPARISON:  None Available. FINDINGS: No acute bony abnormality. Specifically, no fracture, subluxation, or dislocation. No bone destruction. Soft tissues are intact. Vascular calcifications. No soft tissue gas.  IMPRESSION: No acute bony abnormality. Electronically Signed   By: Franky Crease M.D.   On: 01/15/2024 01:54   DG Ankle 2 Views Right Result Date: 01/15/2024 CLINICAL DATA:  Sepsis EXAM: RIGHT ANKLE - 2 VIEW COMPARISON:  None Available. FINDINGS: No acute bony abnormality. Specifically, no fracture, subluxation, or dislocation. No bone destruction. Soft tissues are intact. Vascular calcifications. No soft tissue gas. IMPRESSION: No acute bony abnormality. Electronically Signed   By: Franky Crease  M.D.   On: 01/15/2024 01:54   DG Foot 2 Views Left Result Date: 01/15/2024 CLINICAL DATA:  Sepsis EXAM: LEFT FOOT - 2 VIEW COMPARISON:  None available FINDINGS: No acute bony abnormality. Specifically, no fracture, subluxation, or dislocation. No bone destruction. Soft tissues are intact. Vascular calcifications. No soft tissue gas. IMPRESSION: No acute bony abnormality. Electronically Signed   By: Franky Crease M.D.   On: 01/15/2024 01:53   DG Tibia/Fibula Right Result Date: 01/15/2024 CLINICAL DATA:  Sepsis, open wound to both calves EXAM: RIGHT TIBIA AND FIBULA - 2 VIEW COMPARISON:  07/31/2023 FINDINGS: Soft tissue and vascular calcifications. Advanced degenerative changes in the right knee. No acute bony abnormality. Specifically, no fracture, subluxation, or dislocation. No bone destruction. No soft tissue gas. IMPRESSION: No acute bony abnormality. Electronically Signed   By: Franky Crease M.D.   On: 01/15/2024 01:53   CT Head Wo Contrast Result Date: 01/14/2024 CLINICAL DATA:  Head trauma EXAM: CT HEAD WITHOUT CONTRAST TECHNIQUE: Contiguous axial images were obtained from the base of the skull through the vertex without intravenous contrast. RADIATION DOSE REDUCTION: This exam was performed according to the departmental dose-optimization program which includes automated exposure control, adjustment of the mA and/or kV according to patient size and/or use of iterative reconstruction technique. COMPARISON:  None Available. FINDINGS: Brain: No acute territorial infarction, hemorrhage or intracranial mass. Patchy white matter hypodensity, probably chronic small vessel ischemic change. The ventricles are non enlarged. Vascular: No hyperdense vessels. Vertebral and carotid vascular calcification Skull: Normal. Negative for fracture or focal lesion. Sinuses/Orbits: No acute finding. Mucous retention cyst in the right maxillary sinus Other: None IMPRESSION: 1. No CT evidence for acute intracranial abnormality.  2. Patchy white matter hypodensity, probably chronic small vessel ischemic change. Electronically Signed   By: Luke Bun M.D.   On: 01/14/2024 22:29   VAS US  ABI WITH/WO TBI Result Date: 01/14/2024  LOWER EXTREMITY DOPPLER STUDY Patient Name:  Kailo Kosik  Date of Exam:   01/14/2024 Medical Rec #: [id number]    Accession #:    [id number] Date of Birth: [birth year]   Patient Gender: M Patient Age:   34 years Exam Location:  [address] Procedure:      VAS US  ABI WITH/WO TBI Referring Phys: JOSHUA DILLEY --------------------------------------------------------------------------------  Indications: Ulceration. High Risk Factors: Diabetes, no history of smoking.  Limitations: Today's exam was limited due to patient positioning, patient              intolerant to cuff pressure, an open wound, Unna boot in place,              involuntary patient movement and bandages. Performing Technologist: Edsel Mustard RVT  Examination Guidelines: A complete evaluation includes at minimum, Doppler waveform signals and systolic blood pressure reading at the level of bilateral brachial, anterior tibial, and posterior tibial arteries, when vessel segments are accessible. Bilateral testing is considered an integral part of a complete examination. Photoelectric Plethysmograph (PPG) waveforms and toe systolic pressure readings are included as required  and additional duplex testing as needed. Limited examinations for reoccurring indications may be performed as noted.  ABI Findings: +---------+------------------+-----+---------+--------+ Right    Rt Pressure (mmHg)IndexWaveform Comment  +---------+------------------+-----+---------+--------+ Brachial 141                                      +---------+------------------+-----+---------+--------+ PTA                             triphasic         +---------+------------------+-----+---------+--------+ DP       254               1.80 triphasic          +---------+------------------+-----+---------+--------+ [facility]               0.79 Normal            +---------+------------------+-----+---------+--------+ +---------+------------------+-----+-----------+-------+ Left     Lt Pressure (mmHg)IndexWaveform   Comment +---------+------------------+-----+-----------+-------+ PTA      254               1.80 multiphasic        +---------+------------------+-----+-----------+-------+ DP       254               1.80 triphasic          +---------+------------------+-----+-----------+-------+ Great Toe111               0.79 Normal             +---------+------------------+-----+-----------+-------+ Arterial wall calcification precludes accurate ankle pressures and ABIs. Waveforms demonstrate brisk, triphasic flow.  Summary: Right: Resting right ankle-brachial index indicates noncompressible right lower extremity arteries. The right toe-brachial index is normal.  Left: Resting left ankle-brachial index indicates noncompressible left lower extremity arteries. The left toe-brachial index is normal.  *See table(s) above for measurements and observations.  Electronically signed by Gordy Bergamo MD on 01/14/2024 at 9:01:54 PM.    Final    VAS US  LOWER EXTREMITY VENOUS REFLUX Result Date: 01/14/2024  Lower Venous Reflux Study Patient Name:  Echo Allsbrook  Date of Exam:   01/14/2024 Medical Rec #: [id number]    Accession #:    [id number] Date of Birth: November 18, 1950   Patient Gender: M Patient Age:   73 years Exam Location:  [address] Procedure:      VAS US  LOWER EXTREMITY VENOUS REFLUX Referring Phys: FONDA MORITA --------------------------------------------------------------------------------  Indications: Ulceration. Patient had wounds that developed a few months ago from swelling and trauma. Significant edema.  Limitations: Bandages and open wound. Performing Technologist: Edsel Mustard RVT  Examination Guidelines: A complete evaluation includes  B-mode imaging, spectral Doppler, color Doppler, and power Doppler as needed of all accessible portions of each vessel. Bilateral testing is considered an integral part of a complete examination. Limited examinations for reoccurring indications may be performed as noted. The reflux portion of the exam is performed with the patient in reverse Trendelenburg. Significant venous reflux is defined as >500 ms in the superficial venous system, and >1 second in the deep venous system.  Venous Reflux Times +--------------+---------+------+-----------+------------+--------+ RIGHT         Reflux NoRefluxReflux TimeDiameter cmsComments                         Yes                                  +--------------+---------+------+-----------+------------+--------+  CFV           no                                             +--------------+---------+------+-----------+------------+--------+ FV mid        no                                             +--------------+---------+------+-----------+------------+--------+ Popliteal     no                                             +--------------+---------+------+-----------+------------+--------+ GSV at [facility]    no                            0.78             +--------------+---------+------+-----------+------------+--------+ GSV prox thighno                            0.55             +--------------+---------+------+-----------+------------+--------+ GSV mid thigh no                            0.50             +--------------+---------+------+-----------+------------+--------+ GSV dist thighno                            0.50             +--------------+---------+------+-----------+------------+--------+ GSV at knee   no                            0.40             +--------------+---------+------+-----------+------------+--------+ GSV prox calf no                            0.39              +--------------+---------+------+-----------+------------+--------+ SSV at [facility]    no                            0.26             +--------------+---------+------+-----------+------------+--------+  +--------------+---------+------+-----------+------------+--------+ LEFT          Reflux NoRefluxReflux TimeDiameter cmsComments                         Yes                                  +--------------+---------+------+-----------+------------+--------+ CFV           no                                             +--------------+---------+------+-----------+------------+--------+  FV mid        no                                             +--------------+---------+------+-----------+------------+--------+ Popliteal     no                                             +--------------+---------+------+-----------+------------+--------+ GSV at [facility]    no                            0.72             +--------------+---------+------+-----------+------------+--------+ GSV prox thighno                            0.39             +--------------+---------+------+-----------+------------+--------+ GSV mid thigh no                            0.38             +--------------+---------+------+-----------+------------+--------+ GSV dist thighno                            0.36             +--------------+---------+------+-----------+------------+--------+ GSV at knee   no                            0.30             +--------------+---------+------+-----------+------------+--------+ GSV prox calf no                            0.17             +--------------+---------+------+-----------+------------+--------+ Unable to assess mid and distal calf due to open wounds and bandages.  Summary: Bilateral: Limited examination due to patient pain, bandages and open wounds. - No evidence of deep vein thrombosis seen in the lower extremities, bilaterally, from the  common femoral through the popliteal veins. - No evidence of superficial venous thrombosis in the lower extremities, bilaterally. - No evidence of deep venous insufficiency seen bilaterally in the lower extremity. - No evidence of superficial venous reflux seen in the greater saphenous veins bilaterally. - No evidence of superficial venous reflux seen in the short saphenous veins bilaterally.  *See table(s) above for measurements and observations. Electronically signed by Gordy Bergamo MD on 01/14/2024 at 9:00:59 PM.    Final    DG Chest Port 1 View if patient is in a treatment room. Result Date: 01/14/2024 CLINICAL DATA:  Sepsis. EXAM: PORTABLE CHEST 1 VIEW COMPARISON:  Chest radiograph dated 10/13/2023. FINDINGS: Right sided dialysis catheter in similar position. There is shallow inspiration. There is cardiomegaly with vascular congestion. Bibasilar atelectasis or infiltrate. No pneumothorax. No acute osseous pathology. IMPRESSION: 1. Cardiomegaly with vascular congestion. 2. Bibasilar atelectasis or infiltrate. Electronically Signed   By: Vanetta Chou M.D.   On: 01/14/2024 20:35   Medications:   ceFAZolin  (ANCEF ) IV Stopped (01/15/24 1307)  norepinephrine  (LEVOPHED ) Adult infusion Stopped (01/15/24 2002)    Chlorhexidine  Gluconate Cloth  6 each Topical Q0600   gabapentin   100 mg Oral QHS   heparin   5,000 Units Subcutaneous Q8H   insulin  aspart  0-9 Units Subcutaneous Q4H   midodrine   10 mg Oral Q8H   silver  sulfADIAZINE    Topical Daily    HD orders [facility]  Three times per week MWF TDC 180 NR 400/auto 2 3K/2.5 Ca EDW 97kg  4hrs Recent post wts 97.2, 100.7, 100.6 9/10 Hb 8.1 Meds mircera 100 q2wks, venofer  100 qtx, last mircera 9/1   Assessment/PlanGary Schultz is an 73 y.o. male ESRD on HD, nephrolithiasis, HFrEF 45%A fib, Dm, HTN, HL currently admitted with sepsis and nephrology is consulted for ESRD and assoc conditions co management.    **Septic shock: presumed wound infection,  blood cultures 9/11 + staph.  Improving hemodynamics.  Abx per primary.  Will need TDC change out - can remove today and replace early next week.  Addendum: IR will take out Monday - if he remains stable on abx that's ok but if goes back into shock will have to come out this weekend.   **ESRD on HD: MWF, had 2 hrs HD Friday stopped early due to agitation and filter clotted.  Labs acceptable.  Will give lasix  for volume.    **Anemia: Hb 8.9 overnight, will be due for ESA on 9/15 - dose while inpt   **Hyponatremia: hypervolemic, had UF 0.9L with HD yesterday, will give lasix  today since it was short   **Encephalopathy: waxing and waning, expect delerium now, ok this AM   **DM type 2: per primary   **A fib, HFrEF:  volume mgmt per above (HD and lasix ), otherwise per primary   Will follow, reach out w concerns   Manuelita Barters MD 01/16/2024, 6:57 AM   [facility] Pager: [phone]

## 2024-01-17 ENCOUNTER — Inpatient Hospital Stay (HOSPITAL_COMMUNITY)

## 2024-01-17 DIAGNOSIS — A419 Sepsis, unspecified organism: Secondary | ICD-10-CM | POA: Diagnosis not present

## 2024-01-17 DIAGNOSIS — R6521 Severe sepsis with septic shock: Secondary | ICD-10-CM | POA: Diagnosis not present

## 2024-01-17 DIAGNOSIS — I38 Endocarditis, valve unspecified: Secondary | ICD-10-CM | POA: Diagnosis not present

## 2024-01-17 LAB — BASIC METABOLIC PANEL WITH GFR
Anion gap: 16 — ABNORMAL HIGH (ref 5–15)
BUN: 41 mg/dL — ABNORMAL HIGH (ref 8–23)
CO2: 22 mmol/L (ref 22–32)
Calcium: 8.3 mg/dL — ABNORMAL LOW (ref 8.9–10.3)
Chloride: 96 mmol/L — ABNORMAL LOW (ref 98–111)
Creatinine, Ser: 4.56 mg/dL — ABNORMAL HIGH (ref 0.61–1.24)
GFR, Estimated: 13 mL/min — ABNORMAL LOW (ref >60)
Glucose, Bld: 90 mg/dL (ref 70–99)
Potassium: 4.4 mmol/L (ref 3.5–5.1)
Sodium: 134 mmol/L — ABNORMAL LOW (ref 135–145)

## 2024-01-17 LAB — GLUCOSE, CAPILLARY
Glucose-Capillary: 88 mg/dL (ref 70–99)
Glucose-Capillary: 78 mg/dL (ref 70–99)
Glucose-Capillary: 139 mg/dL — ABNORMAL HIGH (ref 70–99)
Glucose-Capillary: 133 mg/dL — ABNORMAL HIGH (ref 70–99)
Glucose-Capillary: 120 mg/dL — ABNORMAL HIGH (ref 70–99)

## 2024-01-17 LAB — CBC WITH DIFFERENTIAL/PLATELET
Abs Immature Granulocytes: 0.21 K/uL — ABNORMAL HIGH (ref 0.00–0.07)
Basophils Absolute: 0.1 K/uL (ref 0.0–0.1)
Basophils Relative: 0 %
Eosinophils Absolute: 0.5 K/uL (ref 0.0–0.5)
Eosinophils Relative: 4 %
HCT: 23.4 % — ABNORMAL LOW (ref 39.0–52.0)
Hemoglobin: 7.2 g/dL — ABNORMAL LOW (ref 13.0–17.0)
Immature Granulocytes: 2 %
Lymphocytes Relative: 7 %
Lymphs Abs: 0.9 K/uL (ref 0.7–4.0)
MCH: 28.3 pg (ref 26.0–34.0)
MCHC: 30.8 g/dL (ref 30.0–36.0)
MCV: 92.1 fL (ref 80.0–100.0)
Monocytes Absolute: 0.9 K/uL (ref 0.1–1.0)
Monocytes Relative: 6 %
Neutro Abs: 11.5 K/uL — ABNORMAL HIGH (ref 1.7–7.7)
Neutrophils Relative %: 81 %
Platelets: 170 K/uL (ref 150–400)
RBC: 2.54 MIL/uL — ABNORMAL LOW (ref 4.22–5.81)
RDW: 16.4 % — ABNORMAL HIGH (ref 11.5–15.5)
WBC: 14.1 K/uL — ABNORMAL HIGH (ref 4.0–10.5)
nRBC: 0 % (ref 0.0–0.2)

## 2024-01-17 LAB — CULTURE, BLOOD (ROUTINE X 2): Special Requests: ADEQUATE

## 2024-01-17 LAB — ECHOCARDIOGRAM COMPLETE
AR max vel: 2.37 cm2
AV Area VTI: 2.79 cm2
AV Area mean vel: 2.43 cm2
AV Mean grad: 3 mmHg
AV Peak grad: 4.8 mmHg
Ao pk vel: 1.1 m/s
Area-P 1/2: 2.56 cm2
Est EF: 55
S' Lateral: 3.5 cm

## 2024-01-17 MED ORDER — PERFLUTREN LIPID MICROSPHERE
1.0000 mL | INTRAVENOUS | Status: AC | PRN
  Administered 2024-01-17: 2 mL via INTRAVENOUS

## 2024-01-17 MED ORDER — HEPARIN SODIUM (PORCINE) 1000 UNIT/ML IJ SOLN
INTRAMUSCULAR | Status: AC
  Filled 2024-01-17: qty 1

## 2024-01-17 MED ORDER — SODIUM THIOSULFATE 250 MG/ML IV SOLN
25.0000 g | INTRAVENOUS | Status: DC
  Administered 2024-01-20 – 2024-01-22 (×2): 25 g via INTRAVENOUS
  Filled 2024-01-17 (×5): qty 100

## 2024-01-17 NOTE — Plan of Care (Signed)

## 2024-01-17 NOTE — Progress Notes (Signed)
 PROGRESS NOTE  Calvin Schultz FMW:[id number] DOB: 01-10-1951 DOA: 01/14/2024 PCP: Jesus Elberta Gainer, FNP  HPI/Recap of past 24 hours: Patient is a 73 yo M w/ pertinent PMH ESRD on HD MWF, T2DM, chronic A-fib, diastolic CHF, HTN, HLD presents to [facility] on 9/11 with AMS. Per patient's wife patient noted to be progressively more altered with poor po intake. In the ED, pt noted to be febrile 100.3 F and hypotensive. Labs showed Wbc 47 and LA 3.6. CXR w/ b/l infiltrate vs. atelectasis. B/l leg wound present. CT head negative for acute abnormality. Despite iv fluids patient remained hypotensive started on levo. Of note, pt follows outpt w/ [facility] for b/l leg venous stasis ulcer for debridement and wound care. Had dvt us  and abi of ble on 9/11 which were normal.  Pccm consulted for icu admission.  While in the ICU, patient was subsequently weaned off pressors on 9/12.  Patient further stabilized and [facility] resumed care on 9/14.    Today, patient denies any new complaints.  Reports feeling much better.  Alert, awake, oriented x 4.  Better BP readings from LLE    Assessment/Plan: Principal Problem:   Septic shock (HCC) Active Problems:   ESRD on hemodialysis (HCC)   Septic shock MSSA bacteremia likely 2/2 BLE wound/calciphylaxis Currently afebrile, with resolving leukocytosis BP better controlled, off pressors since 9/12 Lactic acidosis has resolved Blood cultures growing MSSA, repeat BC x 2 no growth till date UA was never collected X-rays of bilateral foot were negative for osteomyelitis ID on board, recommend removal of tunneled HD catheter IR has been consulted, plan for [facility] removal on 01/18/2024 after HD TTE with EF of 55%, no regional wall motion abnormality, noted right ventricular volume overload.  No evidence of valvular vegetation noted Plan for TEE Continue midodrine  Continue cefazolin  Monitor closely   Acute metabolic encephalopathy Resolved Likely from  septic shock CT negative for acute abnormality Restarted gabapentin , norco/vicodin, zanaflex , and trazodone  Voltaren  gel as needed for shoulders pain   ESRD on HD MWF BLE calciphylaxis Consulted nephrology Daily renal panel  Anemia of ESRD Hb somewhat around baseline Anemia panel pending Daily CBC  Diastolic CHF HTN HLD Volume management by HD as well as Lasix  Continue Lasix    T2DM A1c 5.6 SSI, Accu-Cheks, hypoglycemia protocol   Chronic A-fib  Rate controlled, not on anticoagulation due to previous acute blood loss from hematuria Not on rate controlling medication  Obesity class I Lifestyle modification advised     Pressure Injury 10/01/23 Heel Left (Active)  10/01/23 2045  Location: Heel  Location Orientation: Left  Staging:   Wound Description (Comments):   DO NOT USE:  Present on Admission: Yes  Dressing Type Foam - Lift dressing to assess site every shift 01/16/24 0330      Estimated body mass index is 34.06 kg/m as calculated from the following:   Height as of this encounter: 5' 8 (1.727 m).   Weight as of this encounter: 101.6 kg.     Code Status: Full  Family Communication: None at bedside  Disposition Plan: Status is: Inpatient Remains inpatient appropriate because: Level of care      Consultants: PCCM ID  Procedures: None  Antimicrobials: Cefazolin   DVT prophylaxis: Heparin  [place]   Objective: Vitals:   01/17/24 0900 01/17/24 1000 01/17/24 1100 01/17/24 1126  BP:  117/66  113/65  Pulse: 60 61 61 60  Resp: 14 15 18 16   Temp:    97.9 F (36.6 C)  TempSrc:  Oral  SpO2: 100% 97% 100% 100%  Weight:      Height:        Intake/Output Summary (Last 24 hours) at 01/17/2024 1405 Last data filed at 01/17/2024 0916 Gross per 24 hour  Intake 270.08 ml  Output --  Net 270.08 ml   Filed Weights   01/15/24 2205 01/16/24 0410 01/17/24 0326  Weight: 102.5 kg 101 kg 101.6 kg       Exam: General: NAD  Cardiovascular: S1,  S2 present Respiratory: CTAB Abdomen: Soft, nontender, nondistended, bowel sounds present Musculoskeletal: No bilateral pedal edema noted, noted BLE dressing C/D/I Skin: Noted BLE calciphylaxis Psychiatry: Normal mood     Data Reviewed: CBC: Recent Labs  Lab 01/14/24 2018 01/14/24 2031 01/15/24 0858 01/16/24 0303 01/17/24 0220  WBC 47.4*  --  40.5* 24.7* 14.1*  NEUTROABS 44.6*  --   --   --  11.5*  HGB 8.1* 9.5* 7.9* 8.9* 7.2*  HCT 26.1* 28.0* 25.2* 28.9* 23.4*  MCV 92.6  --  92.6 92.0 92.1  PLT 229  --  181 174 170   Basic Metabolic Panel: Recent Labs  Lab 01/14/24 2018 01/14/24 2031 01/15/24 0659 01/16/24 0303 01/17/24 0220  NA 134* 133* 132* 134* 134*  K 4.6 4.7 5.5* 4.2 4.4  CL 96* 96* 94* 96* 96*  CO2 23  --  16* 23 22  GLUCOSE 160* 160* 114* 91 90  BUN 27* 31* 31* 26* 41*  CREATININE 4.07* 4.10* 4.38* 3.55* 4.56*  CALCIUM  8.7*  --  8.6* 8.6* 8.3*  MG  --   --  1.7  --   --    GFR: Estimated Creatinine Clearance: 17.4 mL/min (A) (by C-G formula based on SCr of 4.56 mg/dL (H)). Liver Function Tests: Recent Labs  Lab 01/14/24 2018  AST 20  ALT 11  ALKPHOS 105  BILITOT 1.7*  PROT 6.4*  ALBUMIN  2.4*   No results for input(s): LIPASE, AMYLASE in the last 168 hours. No results for input(s): AMMONIA in the last 168 hours. Coagulation Profile: Recent Labs  Lab 01/14/24 2018  INR 1.3*   Cardiac Enzymes: No results for input(s): CKTOTAL, CKMB, CKMBINDEX, TROPONINI in the last 168 hours. BNP (last 3 results) No results for input(s): PROBNP in the last 8760 hours. HbA1C: Recent Labs    01/15/24 0858  HGBA1C 5.6   CBG: Recent Labs  Lab 01/16/24 1942 01/16/24 2339 01/17/24 0321 01/17/24 0801 01/17/24 1205  GLUCAP 134* 92 88 78 139*   Lipid Profile: No results for input(s): CHOL, HDL, LDLCALC, TRIG, CHOLHDL, LDLDIRECT in the last 72 hours. Thyroid Function Tests: No results for input(s): TSH, T4TOTAL,  FREET4, T3FREE, THYROIDAB in the last 72 hours. Anemia Panel: No results for input(s): VITAMINB12, FOLATE, FERRITIN, TIBC, IRON , RETICCTPCT in the last 72 hours. Urine analysis:    Component Value Date/Time   COLORURINE RED (A) 08/04/2023 1434   APPEARANCEUR TURBID (A) 08/04/2023 1434   LABSPEC  08/04/2023 1434    TEST NOT REPORTED DUE TO COLOR INTERFERENCE OF URINE PIGMENT   PHURINE  08/04/2023 1434    TEST NOT REPORTED DUE TO COLOR INTERFERENCE OF URINE PIGMENT   GLUCOSEU (A) 08/04/2023 1434    TEST NOT REPORTED DUE TO COLOR INTERFERENCE OF URINE PIGMENT   HGBUR (A) 08/04/2023 1434    TEST NOT REPORTED DUE TO COLOR INTERFERENCE OF URINE PIGMENT   BILIRUBINUR (A) 08/04/2023 1434    TEST NOT REPORTED DUE TO COLOR INTERFERENCE OF URINE PIGMENT  KETONESUR (A) 08/04/2023 1434    TEST NOT REPORTED DUE TO COLOR INTERFERENCE OF URINE PIGMENT   PROTEINUR (A) 08/04/2023 1434    TEST NOT REPORTED DUE TO COLOR INTERFERENCE OF URINE PIGMENT   NITRITE (A) 08/04/2023 1434    TEST NOT REPORTED DUE TO COLOR INTERFERENCE OF URINE PIGMENT   LEUKOCYTESUR (A) 08/04/2023 1434    TEST NOT REPORTED DUE TO COLOR INTERFERENCE OF URINE PIGMENT   Sepsis Labs: @LABRCNTIP (procalcitonin:4,lacticidven:4)  ) Recent Results (from the past 240 hours)  Culture, blood (Routine x 2)     Status: Abnormal   Collection Time: 01/14/24  8:20 PM   Specimen: BLOOD RIGHT FOREARM  Result Value Ref Range Status   Specimen Description BLOOD RIGHT FOREARM  Final   Special Requests   Final    BOTTLES DRAWN AEROBIC AND ANAEROBIC Blood Culture results may not be optimal due to an inadequate volume of blood received in culture bottles   Culture  Setup Time   Final    GRAM POSITIVE COCCI IN CLUSTERS IN BOTH AEROBIC AND ANAEROBIC BOTTLES CRITICAL VALUE NOTED.  VALUE IS CONSISTENT WITH PREVIOUSLY REPORTED AND CALLED VALUE.    Culture (A)  Final    STAPHYLOCOCCUS AUREUS SUSCEPTIBILITIES PERFORMED ON  PREVIOUS CULTURE WITHIN THE LAST 5 DAYS. Performed at [facility], [address], [place], [place] [address]    Report Status 01/17/2024 FINAL  Final  Culture, blood (Routine x 2)     Status: Abnormal   Collection Time: 01/14/24  8:23 PM   Specimen: BLOOD RIGHT FOREARM  Result Value Ref Range Status   Specimen Description BLOOD RIGHT FOREARM  Final   Special Requests   Final    BOTTLES DRAWN AEROBIC AND ANAEROBIC Blood Culture adequate volume   Culture  Setup Time   Final    GRAM POSITIVE COCCI IN CLUSTERS IN BOTH AEROBIC AND ANAEROBIC BOTTLES CRITICAL RESULT CALLED TO, READ BACK BY AND VERIFIED WITH: MAYA Feliciano ORN on [id number] @0905  by SM Performed at [facility], [address], [place], [place] [address]    Culture STAPHYLOCOCCUS AUREUS (A)  Final   Report Status 01/17/2024 FINAL  Final   Organism ID, Bacteria STAPHYLOCOCCUS AUREUS  Final      Susceptibility   Staphylococcus aureus - MIC*    CIPROFLOXACIN <=0.5 SENSITIVE Sensitive     ERYTHROMYCIN <=0.25 SENSITIVE Sensitive     GENTAMICIN <=0.5 SENSITIVE Sensitive     OXACILLIN 0.5 SENSITIVE Sensitive     TETRACYCLINE <=1 SENSITIVE Sensitive     VANCOMYCIN  <=0.5 SENSITIVE Sensitive     TRIMETH /SULFA  <=10 SENSITIVE Sensitive     CLINDAMYCIN <=0.25 SENSITIVE Sensitive     RIFAMPIN <=0.5 SENSITIVE Sensitive     Inducible Clindamycin NEGATIVE Sensitive     LINEZOLID 2 SENSITIVE Sensitive     * STAPHYLOCOCCUS AUREUS  Blood Culture ID Panel (Reflexed)     Status: Abnormal   Collection Time: 01/14/24  8:23 PM  Result Value Ref Range Status   Enterococcus faecalis NOT DETECTED NOT DETECTED Final   Enterococcus Faecium NOT DETECTED NOT DETECTED Final   Listeria monocytogenes NOT DETECTED NOT DETECTED Final   Staphylococcus species DETECTED (A) NOT DETECTED Final    Comment: CRITICAL RESULT CALLED TO, READ BACK BY AND VERIFIED WITH: PHARMD Feliciano ORN on [id number] @0905  by SM    Staphylococcus aureus (BCID) DETECTED (A) NOT  DETECTED Final    Comment: CRITICAL RESULT CALLED TO, READ BACK BY AND VERIFIED WITH: PHARMD  Feliciano ORN on [id number] @0905  by SM    Staphylococcus epidermidis NOT DETECTED NOT DETECTED Final   Staphylococcus lugdunensis NOT DETECTED NOT DETECTED Final   Streptococcus species NOT DETECTED NOT DETECTED Final   Streptococcus agalactiae NOT DETECTED NOT DETECTED Final   Streptococcus pneumoniae NOT DETECTED NOT DETECTED Final   Streptococcus pyogenes NOT DETECTED NOT DETECTED Final   A.calcoaceticus-baumannii NOT DETECTED NOT DETECTED Final   Bacteroides fragilis NOT DETECTED NOT DETECTED Final   Enterobacterales NOT DETECTED NOT DETECTED Final   Enterobacter cloacae complex NOT DETECTED NOT DETECTED Final   Escherichia coli NOT DETECTED NOT DETECTED Final   Klebsiella aerogenes NOT DETECTED NOT DETECTED Final   Klebsiella oxytoca NOT DETECTED NOT DETECTED Final   Klebsiella pneumoniae NOT DETECTED NOT DETECTED Final   Proteus species NOT DETECTED NOT DETECTED Final   Salmonella species NOT DETECTED NOT DETECTED Final   Serratia marcescens NOT DETECTED NOT DETECTED Final   Haemophilus influenzae NOT DETECTED NOT DETECTED Final   Neisseria meningitidis NOT DETECTED NOT DETECTED Final   Pseudomonas aeruginosa NOT DETECTED NOT DETECTED Final   Stenotrophomonas maltophilia NOT DETECTED NOT DETECTED Final   Candida albicans NOT DETECTED NOT DETECTED Final   Candida auris NOT DETECTED NOT DETECTED Final   Candida glabrata NOT DETECTED NOT DETECTED Final   Candida krusei NOT DETECTED NOT DETECTED Final   Candida parapsilosis NOT DETECTED NOT DETECTED Final   Candida tropicalis NOT DETECTED NOT DETECTED Final   Cryptococcus neoformans/gattii NOT DETECTED NOT DETECTED Final   Meth resistant mecA/C and MREJ NOT DETECTED NOT DETECTED Final    Comment: Performed at [facility], [address] [place], [place] [address]  Resp panel by RT-PCR (RSV, Flu A&B, Covid) Anterior Nasal Swab     Status:  None   Collection Time: 01/14/24  9:30 PM   Specimen: Anterior Nasal Swab  Result Value Ref Range Status   SARS Coronavirus 2 by RT PCR NEGATIVE NEGATIVE Final   Influenza A by PCR NEGATIVE NEGATIVE Final   Influenza B by PCR NEGATIVE NEGATIVE Final    Comment: (NOTE) The Xpert Xpress SARS-CoV-2/FLU/RSV plus assay is intended as an aid in the diagnosis of influenza from Nasopharyngeal swab specimens and should not be used as a sole basis for treatment. Nasal washings and aspirates are unacceptable for Xpert Xpress SARS-CoV-2/FLU/RSV testing.  Fact Sheet for Patients: BloggerCourse.com  Fact Sheet for Healthcare Providers: SeriousBroker.it  This test is not yet approved or cleared by the [place]  FDA and has been authorized for detection and/or diagnosis of SARS-CoV-2 by FDA under an Emergency Use Authorization (EUA). This EUA will remain in effect (meaning this test can be used) for the duration of the COVID-19 declaration under Section 564(b)(1) of the Act, 21 U.S.C. section 360bbb-3(b)(1), unless the authorization is terminated or revoked.     Resp Syncytial Virus by PCR NEGATIVE NEGATIVE Final    Comment: (NOTE) Fact Sheet for Patients: BloggerCourse.com  Fact Sheet for Healthcare Providers: SeriousBroker.it  This test is not yet approved or cleared by the [place]  FDA and has been authorized for detection and/or diagnosis of SARS-CoV-2 by FDA under an Emergency Use Authorization (EUA). This EUA will remain in effect (meaning this test can be used) for the duration of the COVID-19 declaration under Section 564(b)(1) of the Act, 21 U.S.C. section 360bbb-3(b)(1), unless the authorization is terminated or revoked.  Performed at [facility], [address] [place], [place] [address]   MRSA Next Gen  by PCR, Nasal     Status: None   Collection Time:  01/15/24  1:01 AM   Specimen: Nasal Mucosa; Nasal Swab  Result Value Ref Range Status   MRSA by PCR Next Gen NOT DETECTED NOT DETECTED Final    Comment: (NOTE) The GeneXpert MRSA Assay (FDA approved for NASAL specimens only), is one component of a comprehensive MRSA colonization surveillance program. It is not intended to diagnose MRSA infection nor to guide or monitor treatment for MRSA infections. Test performance is not FDA approved in patients less than 53 years old. Performed at [facility], [address] [place], [place] [address]   Culture, blood (Routine X 2) w Reflex to ID Panel     Status: None (Preliminary result)   Collection Time: 01/15/24  5:33 PM   Specimen: BLOOD RIGHT HAND  Result Value Ref Range Status   Specimen Description BLOOD RIGHT HAND  Final   Special Requests   Final    BOTTLES DRAWN AEROBIC ONLY Blood Culture results may not be optimal due to an inadequate volume of blood received in culture bottles   Culture   Final    NO GROWTH 2 DAYS Performed at [facility], [address], [place], [place] [address]    Report Status PENDING  Incomplete  Culture, blood (Routine X 2) w Reflex to ID Panel     Status: None (Preliminary result)   Collection Time: 01/15/24  5:33 PM   Specimen: BLOOD RIGHT HAND  Result Value Ref Range Status   Specimen Description BLOOD RIGHT HAND  Final   Special Requests   Final    BOTTLES DRAWN AEROBIC ONLY Blood Culture results may not be optimal due to an inadequate volume of blood received in culture bottles   Culture   Final    NO GROWTH 2 DAYS Performed at [facility], [address] [place], [place] [address]    Report Status PENDING  Incomplete      Studies: ECHOCARDIOGRAM COMPLETE Result Date: 01/17/2024    ECHOCARDIOGRAM REPORT   Patient Name:   Calvin Schultz Date of Exam: 01/17/2024 Medical Rec #:  [id number]   Height:       68.0 in Accession #:    [id number]  Weight:       224.0 lb Date of Birth:   [birth year]  BSA:          2.145 m Patient Age:    72 years    BP:           113/68 mmHg Patient Gender: M           HR:           60 bpm. Exam Location:  Inpatient Procedure: 2D Echo, Cardiac Doppler, Color Doppler and Intracardiac            Opacification Agent (Both Spectral and Color Flow Doppler were            utilized during procedure). Indications:     Endocarditis I38  History:         Patient has prior history of Echocardiogram examinations, most                  recent 10/13/2023. Arrythmias:Atrial Fibrillation; Risk                  Factors:Hypertension and Diabetes.  Sonographer:     Jayson Gaskins Referring Phys:  [id number] [facility] Diagnosing Phys: Gayatri Acharya  MD IMPRESSIONS  1. Left ventricular ejection fraction, by estimation, is 55%. The left ventricle has normal function. The left ventricle has no regional wall motion abnormalities. There is moderate left ventricular hypertrophy. Left ventricular diastolic parameters are  indeterminate. There is the interventricular septum is flattened in diastole ('D' shaped left ventricle), consistent with right ventricular volume overload.  2. Right ventricular systolic function is moderately reduced. The right ventricular size is moderately enlarged. Tricuspid regurgitation signal is inadequate for assessing PA pressure.  3. The mitral valve is degenerative. Mild mitral valve regurgitation. No evidence of mitral stenosis. Moderate mitral annular calcification.  4. The aortic valve is tricuspid. There is mild calcification of the aortic valve. Aortic valve regurgitation is not visualized. No aortic stenosis is present.  5. Aortic dilatation noted. There is borderline dilatation of the ascending aorta, measuring 42 mm.  6. The inferior vena cava is dilated in size with <50% respiratory variability, suggesting right atrial pressure of 15 mmHg. Conclusion(s)/Recommendation(s): No evidence of valvular vegetations on this transthoracic echocardiogram though  image quality is suboptimal for assessment. Consider a transesophageal echocardiogram to exclude infective endocarditis if clinically indicated. FINDINGS  Left Ventricle: Left ventricular ejection fraction, by estimation, is 55%. The left ventricle has normal function. The left ventricle has no regional wall motion abnormalities. Definity  contrast agent was given IV to delineate the left ventricular endocardial borders. The left ventricular internal cavity size was normal in size. There is moderate left ventricular hypertrophy. The interventricular septum is flattened in diastole ('D' shaped left ventricle), consistent with right ventricular volume overload. Left ventricular diastolic parameters are indeterminate. Right Ventricle: The right ventricular size is moderately enlarged. No increase in right ventricular wall thickness. Right ventricular systolic function is moderately reduced. Tricuspid regurgitation signal is inadequate for assessing PA pressure. The tricuspid regurgitant velocity is 1.84 m/s, and with an assumed right atrial pressure of 15 mmHg, the estimated right ventricular systolic pressure is 28.5 mmHg. Left Atrium: Left atrial size was normal in size. Right Atrium: Right atrial size was normal in size. Pericardium: There is no evidence of pericardial effusion. Mitral Valve: The mitral valve is degenerative in appearance. Moderate mitral annular calcification. Mild mitral valve regurgitation. No evidence of mitral valve stenosis. Tricuspid Valve: The tricuspid valve is normal in structure. Tricuspid valve regurgitation is mild . No evidence of tricuspid stenosis. Aortic Valve: The aortic valve is tricuspid. There is mild calcification of the aortic valve. Aortic valve regurgitation is not visualized. No aortic stenosis is present. Aortic valve mean gradient measures 3.0 mmHg. Aortic valve peak gradient measures 4.8 mmHg. Aortic valve area, by VTI measures 2.79 cm. Pulmonic Valve: The pulmonic valve  was normal in structure. Pulmonic valve regurgitation is trivial. No evidence of pulmonic stenosis. Aorta: Aortic dilatation noted. There is borderline dilatation of the ascending aorta, measuring 42 mm. Venous: The inferior vena cava is dilated in size with less than 50% respiratory variability, suggesting right atrial pressure of 15 mmHg. IAS/Shunts: The interatrial septum was not well visualized.  LEFT VENTRICLE PLAX 2D LVIDd:         4.90 cm LVIDs:         3.50 cm LV PW:         1.80 cm LV IVS:        1.40 cm LVOT diam:     1.90 cm LV SV:         58 LV SV Index:   27 LVOT Area:     2.84 cm  RIGHT  VENTRICLE            IVC RV S prime:     8.59 cm/s  IVC diam: 2.80 cm TAPSE (M-mode): 1.6 cm LEFT ATRIUM             Index        RIGHT ATRIUM           Index LA Vol (A2C):   55.6 ml 25.93 ml/m  RA Area:     22.50 cm LA Vol (A4C):   70.1 ml 32.69 ml/m  RA Volume:   71.80 ml  33.48 ml/m LA Biplane Vol: 63.4 ml 29.56 ml/m  AORTIC VALVE AV Area (Vmax):    2.37 cm AV Area (Vmean):   2.43 cm AV Area (VTI):     2.79 cm AV Vmax:           110.00 cm/s AV Vmean:          76.100 cm/s AV VTI:            0.208 m AV Peak Grad:      4.8 mmHg AV Mean Grad:      3.0 mmHg LVOT Vmax:         91.80 cm/s LVOT Vmean:        65.200 cm/s LVOT VTI:          0.205 m LVOT/AV VTI ratio: 0.99  AORTA Ao Root diam: 3.60 cm Ao Asc diam:  4.20 cm MITRAL VALVE                TRICUSPID VALVE MV Area (PHT): 2.56 cm     TR Peak grad:   13.5 mmHg MV Decel Time: 296 msec     TR Vmax:        184.00 cm/s MV E velocity: 112.00 cm/s MV A velocity: 39.30 cm/s   SHUNTS MV E/A ratio:  2.85         Systemic VTI:  0.20 m                             Systemic Diam: 1.90 cm Soyla Merck MD Electronically signed by Soyla Merck MD Signature Date/Time: 01/17/2024/12:40:39 PM    Final (Updated)     Scheduled Meds:  Chlorhexidine  Gluconate Cloth  6 each Topical Q0600   furosemide   80 mg Intravenous BID   gabapentin   100 mg Oral QHS   heparin   5,000  Units Subcutaneous Q8H   insulin  aspart  0-9 Units Subcutaneous Q4H   midodrine   10 mg Oral Q8H   silver  sulfADIAZINE    Topical Daily    Continuous Infusions:   ceFAZolin  (ANCEF ) IV 1 g (01/17/24 1150)   [START ON 01/18/2024] sodium thiosulfate  25 g in sodium chloride  0.9 % 200 mL Infusion for Calciphylaxis       LOS: 2 days     Lebron JINNY Cage, MD [facility]  If 7PM-7AM, please contact night-coverage [email or web address] 01/17/2024, 2:05 PM

## 2024-01-17 NOTE — Progress Notes (Addendum)
 [facility] Progress Note   Subjective:   Remains afebrile, off pressors today.  Plans for [facility] removal tomorrow, will do HD then remove and replace later in the week.  Objective Vitals:   01/17/24 0500 01/17/24 0600 01/17/24 0700 01/17/24 0715  BP: (!) 82/59 (!) 81/54 (!) 79/48 101/61  Pulse: 60 60 60 60  Resp: 12 (!) 9 15 19   Temp:      TempSrc:      SpO2: 100% 100% 100% 100%  Weight:      Height:       Physical Exam General: chronically ill but nontoxic appearing Heart:RRR - AF on monitor Lungs:clear Abdomen:soft Extremities:1+ pitting edema, wounds wrapped on LE; reviewed images of LE wound in epic media Dialysis Access: [facility]  Additional Objective Labs: Basic Metabolic Panel: Recent Labs  Lab 01/15/24 0659 01/16/24 0303 01/17/24 0220  NA 132* 134* 134*  K 5.5* 4.2 4.4  CL 94* 96* 96*  CO2 16* 23 22  GLUCOSE 114* 91 90  BUN 31* 26* 41*  CREATININE 4.38* 3.55* 4.56*  CALCIUM  8.6* 8.6* 8.3*   Liver Function Tests: Recent Labs  Lab 01/14/24 2018  AST 20  ALT 11  ALKPHOS 105  BILITOT 1.7*  PROT 6.4*  ALBUMIN  2.4*   No results for input(s): LIPASE, AMYLASE in the last 168 hours. CBC: Recent Labs  Lab 01/14/24 2018 01/14/24 2031 01/15/24 0858 01/16/24 0303 01/17/24 0220  WBC 47.4*  --  40.5* 24.7* 14.1*  NEUTROABS 44.6*  --   --   --  11.5*  HGB 8.1*   < > 7.9* 8.9* 7.2*  HCT 26.1*   < > 25.2* 28.9* 23.4*  MCV 92.6  --  92.6 92.0 92.1  PLT 229  --  181 174 170   < > = values in this interval not displayed.   Blood Culture    Component Value Date/Time   SDES BLOOD RIGHT HAND 01/15/2024 1733   SDES BLOOD RIGHT HAND 01/15/2024 1733   SPECREQUEST  01/15/2024 1733    BOTTLES DRAWN AEROBIC ONLY Blood Culture results may not be optimal due to an inadequate volume of blood received in culture bottles   SPECREQUEST  01/15/2024 1733    BOTTLES DRAWN AEROBIC ONLY Blood Culture results may not be optimal due to an inadequate volume  of blood received in culture bottles   CULT  01/15/2024 1733    NO GROWTH < 24 HOURS Performed at [facility], [address] [place], [place] [address]    CULT  01/15/2024 1733    NO GROWTH < 24 HOURS Performed at [facility], [address] [place], [place] [address]    REPTSTATUS PENDING 01/15/2024 1733   REPTSTATUS PENDING 01/15/2024 1733    Cardiac Enzymes: No results for input(s): CKTOTAL, CKMB, CKMBINDEX, TROPONINI in the last 168 hours. CBG: Recent Labs  Lab 01/16/24 1131 01/16/24 1538 01/16/24 1942 01/16/24 2339 01/17/24 0321  GLUCAP 125* 133* 134* 92 88   Iron  Studies: No results for input(s): IRON , TIBC, TRANSFERRIN, FERRITIN in the last 72 hours. @lablastinr3 @ Studies/Results: No results found.  Medications:   ceFAZolin  (ANCEF ) IV Stopped (01/16/24 1122)    Chlorhexidine  Gluconate Cloth  6 each Topical Q0600   furosemide   80 mg Intravenous BID   gabapentin   100 mg Oral QHS   heparin   5,000 Units Subcutaneous Q8H   insulin  aspart  0-9 Units Subcutaneous Q4H   midodrine   10 mg Oral Q8H  silver  sulfADIAZINE    Topical Daily    HD orders [facility]  Three times per week MWF TDC 180 NR 400/auto 2 3K/2.5 Ca EDW 97kg  4hrs Recent post wts 97.2, 100.7, 100.6 9/10 Hb 8.1 Meds mircera 100 q2wks, venofer  100 qtx, last mircera 9/1   Assessment/PlanGary Durflinger is an 73 y.o. male ESRD on HD, nephrolithiasis, HFrEF 45%A fib, Dm, HTN, HL currently admitted with sepsis and nephrology is consulted for ESRD and assoc conditions co management.    **Septic shock: presumed wound infection, blood cultures 9/11 + staph.  Improving hemodynamics.  Abx per primary.  ID recommend removal of HD catheter - Vasculr will do Monday after he has an additional HD   **ESRD on HD: MWF, had 2 hrs HD Friday stopped early due to agitation and filter clotted.  Labs acceptable. HD Sun overnight or Mon Am prior to HD cath removal.  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    **Anemia: Hb 8.9 > 7.2, will be due for ESA on 9/15 - dose while inpt increase dose   **Hyponatremia: hypervolemic: didn't respond to lasix  9/13, UF with HD   **Encephalopathy: waxing and waning, expect delerium now, ok this AM   **DM type 2: per primary   **A fib, HFrEF:  volume mgmt per above, otherwise per primary   Will follow, reach out w concerns   Manuelita Barters MD 01/17/2024, 7:53 AM  [facility] Pager: [phone]

## 2024-01-18 DIAGNOSIS — N186 End stage renal disease: Secondary | ICD-10-CM | POA: Diagnosis not present

## 2024-01-18 DIAGNOSIS — B9561 Methicillin susceptible Staphylococcus aureus infection as the cause of diseases classified elsewhere: Secondary | ICD-10-CM | POA: Insufficient documentation

## 2024-01-18 DIAGNOSIS — I87339 Chronic venous hypertension (idiopathic) with ulcer and inflammation of unspecified lower extremity: Secondary | ICD-10-CM | POA: Diagnosis not present

## 2024-01-18 DIAGNOSIS — R7881 Bacteremia: Secondary | ICD-10-CM | POA: Diagnosis not present

## 2024-01-18 DIAGNOSIS — A419 Sepsis, unspecified organism: Secondary | ICD-10-CM | POA: Diagnosis not present

## 2024-01-18 DIAGNOSIS — R6521 Severe sepsis with septic shock: Secondary | ICD-10-CM | POA: Diagnosis not present

## 2024-01-18 LAB — RENAL FUNCTION PANEL
Albumin: 2.1 g/dL — ABNORMAL LOW (ref 3.5–5.0)
Anion gap: 17 — ABNORMAL HIGH (ref 5–15)
BUN: 53 mg/dL — ABNORMAL HIGH (ref 8–23)
CO2: 24 mmol/L (ref 22–32)
Calcium: 8.6 mg/dL — ABNORMAL LOW (ref 8.9–10.3)
Chloride: 96 mmol/L — ABNORMAL LOW (ref 98–111)
Creatinine, Ser: 5.5 mg/dL — ABNORMAL HIGH (ref 0.61–1.24)
GFR, Estimated: 10 mL/min — ABNORMAL LOW (ref >60)
Glucose, Bld: 80 mg/dL (ref 70–99)
Phosphorus: 6.5 mg/dL — ABNORMAL HIGH (ref 2.5–4.6)
Potassium: 4.9 mmol/L (ref 3.5–5.1)
Sodium: 137 mmol/L (ref 135–145)

## 2024-01-18 LAB — CBC WITH DIFFERENTIAL/PLATELET
Abs Immature Granulocytes: 0.24 K/uL — ABNORMAL HIGH (ref 0.00–0.07)
Basophils Absolute: 0.1 K/uL (ref 0.0–0.1)
Basophils Relative: 0 %
Eosinophils Absolute: 0.5 K/uL (ref 0.0–0.5)
Eosinophils Relative: 4 %
HCT: 26.3 % — ABNORMAL LOW (ref 39.0–52.0)
Hemoglobin: 7.8 g/dL — ABNORMAL LOW (ref 13.0–17.0)
Immature Granulocytes: 2 %
Lymphocytes Relative: 8 %
Lymphs Abs: 1.2 K/uL (ref 0.7–4.0)
MCH: 27.7 pg (ref 26.0–34.0)
MCHC: 29.7 g/dL — ABNORMAL LOW (ref 30.0–36.0)
MCV: 93.3 fL (ref 80.0–100.0)
Monocytes Absolute: 1.4 K/uL — ABNORMAL HIGH (ref 0.1–1.0)
Monocytes Relative: 9 %
Neutro Abs: 11.6 K/uL — ABNORMAL HIGH (ref 1.7–7.7)
Neutrophils Relative %: 77 %
Platelets: 169 K/uL (ref 150–400)
RBC: 2.82 MIL/uL — ABNORMAL LOW (ref 4.22–5.81)
RDW: 16.3 % — ABNORMAL HIGH (ref 11.5–15.5)
WBC: 15 K/uL — ABNORMAL HIGH (ref 4.0–10.5)
nRBC: 0 % (ref 0.0–0.2)

## 2024-01-18 LAB — IRON AND TIBC
Iron: 52 ug/dL (ref 45–182)
Saturation Ratios: 27 % (ref 17.9–39.5)
TIBC: 192 ug/dL — ABNORMAL LOW (ref 250–450)
UIBC: 140 ug/dL

## 2024-01-18 LAB — VITAMIN B12: Vitamin B-12: 1000 pg/mL — ABNORMAL HIGH (ref 180–914)

## 2024-01-18 LAB — GLUCOSE, CAPILLARY
Glucose-Capillary: 107 mg/dL — ABNORMAL HIGH (ref 70–99)
Glucose-Capillary: 137 mg/dL — ABNORMAL HIGH (ref 70–99)
Glucose-Capillary: 180 mg/dL — ABNORMAL HIGH (ref 70–99)
Glucose-Capillary: 79 mg/dL (ref 70–99)
Glucose-Capillary: 84 mg/dL (ref 70–99)
Glucose-Capillary: 93 mg/dL (ref 70–99)

## 2024-01-18 LAB — FOLATE: Folate: 12.9 ng/mL (ref >5.9)

## 2024-01-18 LAB — FERRITIN: Ferritin: 1284 ng/mL — ABNORMAL HIGH (ref 24–336)

## 2024-01-18 MED ORDER — SEVELAMER CARBONATE 800 MG PO TABS
800.0000 mg | ORAL_TABLET | Freq: Three times a day (TID) | ORAL | Status: DC
  Administered 2024-01-18 – 2024-01-19 (×3): 800 mg via ORAL
  Filled 2024-01-18 (×3): qty 1

## 2024-01-18 MED ORDER — LIDOCAINE HCL (PF) 1 % IJ SOLN
INTRAMUSCULAR | Status: AC
  Filled 2024-01-18: qty 30

## 2024-01-18 MED ORDER — SODIUM CHLORIDE 0.9% FLUSH: 10.0000 mL | INTRAVENOUS | Status: DC | PRN

## 2024-01-18 MED ORDER — DARBEPOETIN ALFA 150 MCG/0.3ML IJ SOSY
150.0000 ug | PREFILLED_SYRINGE | INTRAMUSCULAR | Status: DC
Start: 2024-01-18 — End: 2024-01-24
  Filled 2024-01-18: qty 0.3

## 2024-01-18 MED ORDER — SODIUM CHLORIDE 0.9% FLUSH
10.0000 mL | Freq: Two times a day (BID) | INTRAVENOUS | Status: DC
  Administered 2024-01-18 – 2024-01-23 (×9): 10 mL

## 2024-01-18 NOTE — Progress Notes (Signed)
 HD tx didn't get initiated as expected due to HD catheter not functioning, it could be flushed and not pulled.  When pt breathed the blood flow cut off, pt coughed, blood flowed. pt sent back to his room for further  evaluation.

## 2024-01-18 NOTE — Progress Notes (Addendum)
 [facility] Progress Note   Subjective:   Seen in room. Plan was HD last night then remove TDC this AM for line holiday. Unfortunately, the catheter was completely clogged and unable to be opened, so did NOT get HD last night. Last Blood Cx negative as of 9/12, but given bacteremia, ID still recommending line holiday. Using O2, but denies dyspnea today.   Objective Vitals:   01/17/24 1951 01/18/24 0014 01/18/24 0445 01/18/24 0737  BP: (!) 131/57 (!) 91/56 123/68 (!) 100/52  Pulse: (!) 59 60 60 62  Resp: 16 16 16 16   Temp: 98 F (36.7 C) 98 F (36.7 C) 98.1 F (36.7 C) 97.6 F (36.4 C)  TempSrc: Oral Oral  Oral  SpO2: 100% 98% 100% 92%  Weight:      Height:       Physical Exam General: Well appearing man, NAD. Jet in place Heart: RRR; no murmur Lungs: Bibasilar rales present Abdomen: distended, soft Extremities: trace BLE edema (wounds wrapped) - pics c/w calphylaxis Dialysis Access: [facility]  Additional Objective Labs: Basic Metabolic Panel: Recent Labs  Lab 01/16/24 0303 01/17/24 0220 01/18/24 0454  NA 134* 134* 137  K 4.2 4.4 4.9  CL 96* 96* 96*  CO2 23 22 24   GLUCOSE 91 90 80  BUN 26* 41* 53*  CREATININE 3.55* 4.56* 5.50*  CALCIUM  8.6* 8.3* 8.6*  PHOS  --   --  6.5*   Liver Function Tests: Recent Labs  Lab 01/14/24 2018 01/18/24 0454  AST 20  --   ALT 11  --   ALKPHOS 105  --   BILITOT 1.7*  --   PROT 6.4*  --   ALBUMIN  2.4* 2.1*   CBC: Recent Labs  Lab 01/14/24 2018 01/14/24 2031 01/15/24 0858 01/16/24 0303 01/17/24 0220 01/18/24 0454  WBC 47.4*  --  40.5* 24.7* 14.1* 15.0*  NEUTROABS 44.6*  --   --   --  11.5* 11.6*  HGB 8.1*   < > 7.9* 8.9* 7.2* 7.8*  HCT 26.1*   < > 25.2* 28.9* 23.4* 26.3*  MCV 92.6  --  92.6 92.0 92.1 93.3  PLT 229  --  181 174 170 169   < > = values in this interval not displayed.   Blood Culture    Component Value Date/Time   SDES BLOOD RIGHT HAND 01/15/2024 1733   SDES BLOOD RIGHT HAND 01/15/2024  1733   SPECREQUEST  01/15/2024 1733    BOTTLES DRAWN AEROBIC ONLY Blood Culture results may not be optimal due to an inadequate volume of blood received in culture bottles   SPECREQUEST  01/15/2024 1733    BOTTLES DRAWN AEROBIC ONLY Blood Culture results may not be optimal due to an inadequate volume of blood received in culture bottles   CULT  01/15/2024 1733    NO GROWTH 3 DAYS Performed at [facility], [address] [place], [place] [address]    CULT  01/15/2024 1733    NO GROWTH 3 DAYS Performed at [facility], [address], [place], [place] [address]    REPTSTATUS PENDING 01/15/2024 1733   REPTSTATUS PENDING 01/15/2024 1733   Studies/Results: ECHOCARDIOGRAM COMPLETE Result Date: 01/17/2024    ECHOCARDIOGRAM REPORT   Patient Name:   KORD MONETTE Date of Exam: 01/17/2024 Medical Rec #:  [id number]   Height:       68.0 in Accession #:    [id number]  Weight:       224.0 lb  Date of Birth:  [birth year]  BSA:          2.145 m Patient Age:    73 years    BP:           113/68 mmHg Patient Gender: M           HR:           60 bpm. Exam Location:  Inpatient Procedure: 2D Echo, Cardiac Doppler, Color Doppler and Intracardiac            Opacification Agent (Both Spectral and Color Flow Doppler were            utilized during procedure). Indications:     Endocarditis I38  History:         Patient has prior history of Echocardiogram examinations, most                  recent 10/13/2023. Arrythmias:Atrial Fibrillation; Risk                  Factors:Hypertension and Diabetes.  Sonographer:     Jayson Gaskins Referring Phys:  [id number] [facility] Diagnosing Phys: Soyla Merck MD IMPRESSIONS  1. Left ventricular ejection fraction, by estimation, is 55%. The left ventricle has normal function. The left ventricle has no regional wall motion abnormalities. There is moderate left ventricular hypertrophy. Left ventricular diastolic parameters are  indeterminate. There is the interventricular septum is  flattened in diastole ('D' shaped left ventricle), consistent with right ventricular volume overload.  2. Right ventricular systolic function is moderately reduced. The right ventricular size is moderately enlarged. Tricuspid regurgitation signal is inadequate for assessing PA pressure.  3. The mitral valve is degenerative. Mild mitral valve regurgitation. No evidence of mitral stenosis. Moderate mitral annular calcification.  4. The aortic valve is tricuspid. There is mild calcification of the aortic valve. Aortic valve regurgitation is not visualized. No aortic stenosis is present.  5. Aortic dilatation noted. There is borderline dilatation of the ascending aorta, measuring 42 mm.  6. The inferior vena cava is dilated in size with <50% respiratory variability, suggesting right atrial pressure of 15 mmHg. Conclusion(s)/Recommendation(s): No evidence of valvular vegetations on this transthoracic echocardiogram though image quality is suboptimal for assessment. Consider a transesophageal echocardiogram to exclude infective endocarditis if clinically indicated. FINDINGS  Left Ventricle: Left ventricular ejection fraction, by estimation, is 55%. The left ventricle has normal function. The left ventricle has no regional wall motion abnormalities. Definity  contrast agent was given IV to delineate the left ventricular endocardial borders. The left ventricular internal cavity size was normal in size. There is moderate left ventricular hypertrophy. The interventricular septum is flattened in diastole ('D' shaped left ventricle), consistent with right ventricular volume overload. Left ventricular diastolic parameters are indeterminate. Right Ventricle: The right ventricular size is moderately enlarged. No increase in right ventricular wall thickness. Right ventricular systolic function is moderately reduced. Tricuspid regurgitation signal is inadequate for assessing PA pressure. The tricuspid regurgitant velocity is 1.84  m/s, and with an assumed right atrial pressure of 15 mmHg, the estimated right ventricular systolic pressure is 28.5 mmHg. Left Atrium: Left atrial size was normal in size. Right Atrium: Right atrial size was normal in size. Pericardium: There is no evidence of pericardial effusion. Mitral Valve: The mitral valve is degenerative in appearance. Moderate mitral annular calcification. Mild mitral valve regurgitation. No evidence of mitral valve stenosis. Tricuspid Valve: The tricuspid valve is normal in structure. Tricuspid valve regurgitation is mild . No evidence of  tricuspid stenosis. Aortic Valve: The aortic valve is tricuspid. There is mild calcification of the aortic valve. Aortic valve regurgitation is not visualized. No aortic stenosis is present. Aortic valve mean gradient measures 3.0 mmHg. Aortic valve peak gradient measures 4.8 mmHg. Aortic valve area, by VTI measures 2.79 cm. Pulmonic Valve: The pulmonic valve was normal in structure. Pulmonic valve regurgitation is trivial. No evidence of pulmonic stenosis. Aorta: Aortic dilatation noted. There is borderline dilatation of the ascending aorta, measuring 42 mm. Venous: The inferior vena cava is dilated in size with less than 50% respiratory variability, suggesting right atrial pressure of 15 mmHg. IAS/Shunts: The interatrial septum was not well visualized.  LEFT VENTRICLE PLAX 2D LVIDd:         4.90 cm LVIDs:         3.50 cm LV PW:         1.80 cm LV IVS:        1.40 cm LVOT diam:     1.90 cm LV SV:         58 LV SV Index:   27 LVOT Area:     2.84 cm  RIGHT VENTRICLE            IVC RV S prime:     8.59 cm/s  IVC diam: 2.80 cm TAPSE (M-mode): 1.6 cm LEFT ATRIUM             Index        RIGHT ATRIUM           Index LA Vol (A2C):   55.6 ml 25.93 ml/m  RA Area:     22.50 cm LA Vol (A4C):   70.1 ml 32.69 ml/m  RA Volume:   71.80 ml  33.48 ml/m LA Biplane Vol: 63.4 ml 29.56 ml/m  AORTIC VALVE AV Area (Vmax):    2.37 cm AV Area (Vmean):   2.43 cm AV Area  (VTI):     2.79 cm AV Vmax:           110.00 cm/s AV Vmean:          76.100 cm/s AV VTI:            0.208 m AV Peak Grad:      4.8 mmHg AV Mean Grad:      3.0 mmHg LVOT Vmax:         91.80 cm/s LVOT Vmean:        65.200 cm/s LVOT VTI:          0.205 m LVOT/AV VTI ratio: 0.99  AORTA Ao Root diam: 3.60 cm Ao Asc diam:  4.20 cm MITRAL VALVE                TRICUSPID VALVE MV Area (PHT): 2.56 cm     TR Peak grad:   13.5 mmHg MV Decel Time: 296 msec     TR Vmax:        184.00 cm/s MV E velocity: 112.00 cm/s MV A velocity: 39.30 cm/s   SHUNTS MV E/A ratio:  2.85         Systemic VTI:  0.20 m                             Systemic Diam: 1.90 cm Soyla Merck MD Electronically signed by Soyla Merck MD Signature Date/Time: 01/17/2024/12:40:39 PM    Final (Updated)    Medications:   ceFAZolin  (ANCEF ) IV 1 g (01/18/24  1048)   sodium thiosulfate  25 g in sodium chloride  0.9 % 200 mL Infusion for Calciphylaxis      Chlorhexidine  Gluconate Cloth  6 each Topical Q0600   furosemide   80 mg Intravenous BID   gabapentin   100 mg Oral QHS   heparin   5,000 Units Subcutaneous Q8H   insulin  aspart  0-9 Units Subcutaneous Q4H   midodrine   10 mg Oral Q8H   silver  sulfADIAZINE    Topical Daily   sodium chloride  flush  10-40 mL Intracatheter Q12H   Dialysis Orders MWF - NW 4hr, 400/A2, EDW 97kg, 3K/2.5Ca bath, TDC, no heparin  - Mircera 100mcg IV q 2 weeks (last 9/1) - Venofer  100mg  x 10 - no VDRA  Assessment/Plan: Septic shock/MSSA bacteremia: Blood Cx 9/11 +, repeat Cx 9/12 negative. Source - TDC v. leg wounds. On Cefazolin . Plan was for [facility] removal today, then line holiday, then new TDC - have reached out to ID to see if can do [facility] exchange over wire or if truly needs the holiday - looks like they still want holiday. WBC much improved. BLE wounds/calciphylaxis: VDRA on hold, have started Na Thiosulfate with HD.  ESRD: Usual MWF schedule - last HD 9/12. TDC now completely clogged. See above discussion. Next HD once  has line. HypoTN/volume: Chronically low BP, on mido. Still making some urine - for IV Lasix  today.  Anemia of ESRD: Hgb 7.8 - due for ESA today, will order. IV iron  on hold d/t infection. Secondary HPTH: CorrCa ok, Phos high - start sevelamer  as binder. Nutrition: Alb low, adding supplements. T2DM   Izetta Boehringer, PA-C 01/18/2024, 11:33 AM  [employer]

## 2024-01-18 NOTE — Plan of Care (Signed)

## 2024-01-18 NOTE — Evaluation (Signed)
 Occupational Therapy Evaluation Patient Details Name: Calvin Schultz MRN: [id number] DOB: 03/30/1951 Today's Date: 01/18/2024   History of Present Illness   Patient is a 73 y/o male admitted 01/14/24 with AMS and fever and hypotension with concern for septic shock.  PMH positive for ESRD on HD, DM 2, chronic a-fib, diastolic CHF, HTN, HLD, BPH, CKD III, LE venous statsis ulcers.     Clinical Impressions PTA, pt lived with wife who since January has been providing total A for LB ADL, assisting with bed mobility, and hoyer transfers; pt with power chair in which is his primary form of mobility at this time. Pt has been working on standing with PT/OT in home setting. Today, pt performs bed mobility with mod A and STS transfer with stedy with mod A with poor hip extension and ability to reach full upright position. Will continue to follow.      If plan is discharge home, recommend the following:   Two people to help with walking and/or transfers;Two people to help with bathing/dressing/bathroom;Assistance with cooking/housework;Assist for transportation;Help with stairs or ramp for entrance     Functional Status Assessment   Patient has had a recent decline in their functional status and demonstrates the ability to make significant improvements in function in a reasonable and predictable amount of time.     Equipment Recommendations   None recommended by OT     Recommendations for Other Services         Precautions/Restrictions   Precautions Precautions: Fall Restrictions Weight Bearing Restrictions Per Provider Order: No     Mobility Bed Mobility Overal bed mobility: Needs Assistance Bed Mobility: Supine to Sit, Sit to Supine     Supine to sit: Mod assist Sit to supine: Mod assist        Transfers Overall transfer level: Needs assistance Equipment used: Ambulation equipment used Transfers: Sit to/from Stand Sit to Stand: Mod assist           General  transfer comment: for STS with stedy. Pt limited by poor flexion in RLE for positioning in prep for stand and poor hip extension/L knee extension with coming to stand.      Balance Overall balance assessment: Needs assistance   Sitting balance-Leahy Scale: Fair Sitting balance - Comments: statically at EOB                                   ADL either performed or assessed with clinical judgement   ADL Overall ADL's : Needs assistance/impaired Eating/Feeding: Set up;Bed level;Sitting Eating/Feeding Details (indicate cue type and reason): sitting EOB when positioned with BLE on floor and hips squared up to EOB Grooming: Set up;Supervision/safety;Sitting   Upper Body Bathing: Sitting;Minimal assistance   Lower Body Bathing: Total assistance;Sitting/lateral leans   Upper Body Dressing : Minimal assistance;Sitting   Lower Body Dressing: Total assistance;Sit to/from stand   Toilet Transfer: Moderate assistance Toilet Transfer Details (indicate cue type and reason): STS with stedy; able to clear hips, but unable to fully extend L knee or hips to get into full upright position                 Vision Baseline Vision/History: 0 No visual deficits Ability to See in Adequate Light: 0 Adequate Patient Visual Report: No change from baseline Vision Assessment?: Wears glasses for reading     Perception         Praxis  Pertinent Vitals/Pain Pain Assessment Pain Assessment: Faces Faces Pain Scale: Hurts little more Pain Location: legs Pain Descriptors / Indicators: Aching, Discomfort Pain Intervention(s): Limited activity within patient's tolerance, Monitored during session     Extremity/Trunk Assessment Upper Extremity Assessment Upper Extremity Assessment: Generalized weakness;RUE deficits/detail;LUE deficits/detail RUE Deficits / Details: per pt frequent R shoulder pain some difficulty getting hand over head LUE Deficits / Details: difficulty with  initiation of ff of shoulder, once provided assist with initiation, able to ff full range   Lower Extremity Assessment Lower Extremity Assessment: Defer to PT evaluation   Cervical / Trunk Assessment Cervical / Trunk Assessment: Kyphotic   Communication Communication Communication: No apparent difficulties   Cognition Arousal: Alert Behavior During Therapy: WFL for tasks assessed/performed Cognition: No apparent impairments                               Following commands: Intact       Cueing  General Comments   Cueing Techniques: Verbal cues      Exercises     Shoulder Instructions      Home Living Family/patient expects to be discharged to:: Private residence Living Arrangements: Spouse/significant other Available Help at Discharge: Family;Available 24 hours/day Type of Home: House Home Access: Ramped entrance     Home Layout: Two level;Able to live on main level with bedroom/bathroom     Bathroom Shower/Tub: Walk-in shower;Sponge bathes at baseline   [employer]: Standard     Home Equipment: Agricultural consultant (2 wheels);Rollator (4 wheels);Cane - single point;BSC/3in1;Shower seat;Grab bars - toilet;Wheelchair - manual;Hospital bed;Other (comment)   Additional Comments: Hoyer lift for OOB to wheelchair.      Prior Functioning/Environment Prior Level of Function : Needs assist       Physical Assist : Mobility (physical);ADLs (physical) Mobility (physical): Bed mobility;Transfers ADLs (physical): Bathing;Dressing;Toileting;IADLs Mobility Comments: Patient states hoyer lift from hospital bed to wheelchair.  Patient has not been able to stand. ADLs Comments: Spouse assists with bedlevel and wheelchair level bathing/dressing.  Bedpan at home.    OT Problem List: Decreased strength;Decreased activity tolerance;Impaired balance (sitting and/or standing);Decreased knowledge of use of DME or AE;Impaired UE functional use;Pain   OT  Treatment/Interventions: Self-care/ADL training;Therapeutic exercise;DME and/or AE instruction;Therapeutic activities;Patient/family education;Balance training      OT Goals(Current goals can be found in the care plan section)   Acute Rehab OT Goals Patient Stated Goal: get better OT Goal Formulation: With patient Time For Goal Achievement: 02/01/24 Potential to Achieve Goals: Good   OT Frequency:  Min 2X/week    Co-evaluation              AM-PAC OT 6 Clicks Daily Activity     Outcome Measure Help from another person eating meals?: A Little Help from another person taking care of personal grooming?: A Little Help from another person toileting, which includes using toliet, bedpan, or urinal?: Total Help from another person bathing (including washing, rinsing, drying)?: A Lot Help from another person to put on and taking off regular upper body clothing?: A Little Help from another person to put on and taking off regular lower body clothing?: Total 6 Click Score: 13   End of Session Equipment Utilized During Treatment: Other (comment) (stedy) Nurse Communication: Mobility status  Activity Tolerance: Patient tolerated treatment well Patient left: in bed;with call bell/phone within reach;with bed alarm set  OT Visit Diagnosis: Unsteadiness on feet (R26.81);Muscle weakness (generalized) (M62.81);Pain Pain - part  of body:  (BLE)                Time: 1330-1405 OT Time Calculation (min): 35 min Charges:  OT General Charges $OT Visit: 1 Visit OT Evaluation $OT Eval Moderate Complexity: 1 Mod OT Treatments $Self Care/Home Management : 8-22 mins  Elma JONETTA Lebron FREDERICK, OTR/L [facility] Acute Rehabilitation Office: [phone]   Elma JONETTA Lebron 01/18/2024, 4:05 PM

## 2024-01-18 NOTE — Progress Notes (Signed)
 [facility]  Date of Admission:  01/14/2024     Reason for Follow Up: Septic shock (HCC)  Total days of antibiotics 5         ASSESSMENT:  Mr. Forrester is a 73 y/o caucasian male with ESRD on HD via right internal jugular catheter presenting with altered mental status, fever and weakness and found to have MSSA bacteremia.   Mr. Lennox blood cultures from 01/15/24 remain without growth to date. Tolerating antibiotics with no adverse side effects. Will need tunneled catheter line holiday secondary to MSSA bacteremia with timing per Nephrology. Discussed plan of care to continue with current dose of Cefazolin  and need for TEE to rule out possibility of bacterial endocarditis given TTE image quality being suboptimal with no vegetations seen. Continue to monitor blood cultures for clearance of bacteremia. Suspect catheter is likely source at this point given no other clear sources. Continue wound care per Wound RN recommendations. Continue standard precautions. Remaining medical and supportive care per Internal Medicine and Nephrology.   PLAN:  Continue current dose of Cefazolin . Monitor blood cultures for clearance of bacteremia.  Line holiday per Nephrology in setting of MSSA bacteremia.  TEE ordered to check for endocarditis. Standard precautions.  Remaining medical and supportive care per Internal Medicine and Nephrology.   Principal Problem:   Septic shock (HCC) Active Problems:   ESRD on hemodialysis (HCC)   MSSA bacteremia    Chlorhexidine  Gluconate Cloth  6 each Topical Q0600   furosemide   80 mg Intravenous BID   gabapentin   100 mg Oral QHS   heparin   5,000 Units Subcutaneous Q8H   insulin  aspart  0-9 Units Subcutaneous Q4H   midodrine   10 mg Oral Q8H   silver  sulfADIAZINE    Topical Daily   sodium chloride  flush  10-40 mL Intracatheter Q12H    SUBJECTIVE:  Afebrile overnight with no acute events.Tolerating antibiotics with no adverse side  effects.   No Known Allergies   Review of Systems: Review of Systems  Constitutional:  Negative for chills, fever and weight loss.  Respiratory:  Negative for cough, shortness of breath and wheezing.   Cardiovascular:  Negative for chest pain and leg swelling.  Gastrointestinal:  Negative for abdominal pain, constipation, diarrhea, nausea and vomiting.  Skin:  Negative for rash.      OBJECTIVE: Vitals:   01/17/24 1951 01/18/24 0014 01/18/24 0445 01/18/24 0737  BP: (!) 131/57 (!) 91/56 123/68 (!) 100/52  Pulse: (!) 59 60 60 62  Resp: 16 16 16 16   Temp: 98 F (36.7 C) 98 F (36.7 C) 98.1 F (36.7 C) 97.6 F (36.4 C)  TempSrc: Oral Oral  Oral  SpO2: 100% 98% 100% 92%  Weight:      Height:       Body mass index is 34.06 kg/m.  Physical Exam Constitutional:      General: He is not in acute distress.    Appearance: He is well-developed.     Comments: Lying in bed with head of bed elevated; pleasant.   Cardiovascular:     Rate and Rhythm: Normal rate and regular rhythm.     Heart sounds: Normal heart sounds.  Pulmonary:     Effort: Pulmonary effort is normal.     Breath sounds: Normal breath sounds.  Skin:    General: Skin is warm and dry.  Neurological:     Mental Status: He is alert and oriented to person, place, and time.  Psychiatric:  Mood and Affect: Mood normal.     Lab Results Lab Results  Component Value Date   WBC 15.0 (H) 01/18/2024   HGB 7.8 (L) 01/18/2024   HCT 26.3 (L) 01/18/2024   MCV 93.3 01/18/2024   PLT 169 01/18/2024    Lab Results  Component Value Date   CREATININE 5.50 (H) 01/18/2024   BUN 53 (H) 01/18/2024   NA 137 01/18/2024   K 4.9 01/18/2024   CL 96 (L) 01/18/2024   CO2 24 01/18/2024    Lab Results  Component Value Date   ALT 11 01/14/2024   AST 20 01/14/2024   ALKPHOS 105 01/14/2024   BILITOT 1.7 (H) 01/14/2024     Microbiology: Recent Results (from the past 240 hours)  Culture, blood (Routine x 2)     Status:  Abnormal   Collection Time: 01/14/24  8:20 PM   Specimen: BLOOD RIGHT FOREARM  Result Value Ref Range Status   Specimen Description BLOOD RIGHT FOREARM  Final   Special Requests   Final    BOTTLES DRAWN AEROBIC AND ANAEROBIC Blood Culture results may not be optimal due to an inadequate volume of blood received in culture bottles   Culture  Setup Time   Final    GRAM POSITIVE COCCI IN CLUSTERS IN BOTH AEROBIC AND ANAEROBIC BOTTLES CRITICAL VALUE NOTED.  VALUE IS CONSISTENT WITH PREVIOUSLY REPORTED AND CALLED VALUE.    Culture (A)  Final    STAPHYLOCOCCUS AUREUS SUSCEPTIBILITIES PERFORMED ON PREVIOUS CULTURE WITHIN THE LAST 5 DAYS. Performed at [facility], [address] [place], [place] [address]    Report Status 01/17/2024 FINAL  Final  Culture, blood (Routine x 2)     Status: Abnormal   Collection Time: 01/14/24  8:23 PM   Specimen: BLOOD RIGHT FOREARM  Result Value Ref Range Status   Specimen Description BLOOD RIGHT FOREARM  Final   Special Requests   Final    BOTTLES DRAWN AEROBIC AND ANAEROBIC Blood Culture adequate volume   Culture  Setup Time   Final    GRAM POSITIVE COCCI IN CLUSTERS IN BOTH AEROBIC AND ANAEROBIC BOTTLES CRITICAL RESULT CALLED TO, READ BACK BY AND VERIFIED WITH: MAYA Feliciano ORN on [id number] @0905  by SM Performed at [facility], [address] [place], [place] [address]    Culture STAPHYLOCOCCUS AUREUS (A)  Final   Report Status 01/17/2024 FINAL  Final   Organism ID, Bacteria STAPHYLOCOCCUS AUREUS  Final      Susceptibility   Staphylococcus aureus - MIC*    CIPROFLOXACIN <=0.5 SENSITIVE Sensitive     ERYTHROMYCIN <=0.25 SENSITIVE Sensitive     GENTAMICIN <=0.5 SENSITIVE Sensitive     OXACILLIN 0.5 SENSITIVE Sensitive     TETRACYCLINE <=1 SENSITIVE Sensitive     VANCOMYCIN  <=0.5 SENSITIVE Sensitive     TRIMETH /SULFA  <=10 SENSITIVE Sensitive     CLINDAMYCIN <=0.25 SENSITIVE Sensitive     RIFAMPIN <=0.5 SENSITIVE Sensitive     Inducible  Clindamycin NEGATIVE Sensitive     LINEZOLID 2 SENSITIVE Sensitive     * STAPHYLOCOCCUS AUREUS  Blood Culture ID Panel (Reflexed)     Status: Abnormal   Collection Time: 01/14/24  8:23 PM  Result Value Ref Range Status   Enterococcus faecalis NOT DETECTED NOT DETECTED Final   Enterococcus Faecium NOT DETECTED NOT DETECTED Final   Listeria monocytogenes NOT DETECTED NOT DETECTED Final   Staphylococcus species DETECTED (A) NOT DETECTED Final    Comment: CRITICAL RESULT CALLED  TO, READ BACK BY AND VERIFIED WITH: MAYA Feliciano ORN on [id number] @0905  by SM    Staphylococcus aureus (BCID) DETECTED (A) NOT DETECTED Final    Comment: CRITICAL RESULT CALLED TO, READ BACK BY AND VERIFIED WITH: MAYA Feliciano ORN on [id number] @0905  by SM    Staphylococcus epidermidis NOT DETECTED NOT DETECTED Final   Staphylococcus lugdunensis NOT DETECTED NOT DETECTED Final   Streptococcus species NOT DETECTED NOT DETECTED Final   Streptococcus agalactiae NOT DETECTED NOT DETECTED Final   Streptococcus pneumoniae NOT DETECTED NOT DETECTED Final   Streptococcus pyogenes NOT DETECTED NOT DETECTED Final   A.calcoaceticus-baumannii NOT DETECTED NOT DETECTED Final   Bacteroides fragilis NOT DETECTED NOT DETECTED Final   Enterobacterales NOT DETECTED NOT DETECTED Final   Enterobacter cloacae complex NOT DETECTED NOT DETECTED Final   Escherichia coli NOT DETECTED NOT DETECTED Final   Klebsiella aerogenes NOT DETECTED NOT DETECTED Final   Klebsiella oxytoca NOT DETECTED NOT DETECTED Final   Klebsiella pneumoniae NOT DETECTED NOT DETECTED Final   Proteus species NOT DETECTED NOT DETECTED Final   Salmonella species NOT DETECTED NOT DETECTED Final   Serratia marcescens NOT DETECTED NOT DETECTED Final   Haemophilus influenzae NOT DETECTED NOT DETECTED Final   Neisseria meningitidis NOT DETECTED NOT DETECTED Final   Pseudomonas aeruginosa NOT DETECTED NOT DETECTED Final   Stenotrophomonas maltophilia NOT DETECTED NOT DETECTED Final    Candida albicans NOT DETECTED NOT DETECTED Final   Candida auris NOT DETECTED NOT DETECTED Final   Candida glabrata NOT DETECTED NOT DETECTED Final   Candida krusei NOT DETECTED NOT DETECTED Final   Candida parapsilosis NOT DETECTED NOT DETECTED Final   Candida tropicalis NOT DETECTED NOT DETECTED Final   Cryptococcus neoformans/gattii NOT DETECTED NOT DETECTED Final   Meth resistant mecA/C and MREJ NOT DETECTED NOT DETECTED Final    Comment: Performed at [facility], [address], [place], [place] [address]  Resp panel by RT-PCR (RSV, Flu A&B, Covid) Anterior Nasal Swab     Status: None   Collection Time: 01/14/24  9:30 PM   Specimen: Anterior Nasal Swab  Result Value Ref Range Status   SARS Coronavirus 2 by RT PCR NEGATIVE NEGATIVE Final   Influenza A by PCR NEGATIVE NEGATIVE Final   Influenza B by PCR NEGATIVE NEGATIVE Final    Comment: (NOTE) The Xpert Xpress SARS-CoV-2/FLU/RSV plus assay is intended as an aid in the diagnosis of influenza from Nasopharyngeal swab specimens and should not be used as a sole basis for treatment. Nasal washings and aspirates are unacceptable for Xpert Xpress SARS-CoV-2/FLU/RSV testing.  Fact Sheet for Patients: BloggerCourse.com  Fact Sheet for Healthcare Providers: SeriousBroker.it  This test is not yet approved or cleared by the [place]  FDA and has been authorized for detection and/or diagnosis of SARS-CoV-2 by FDA under an Emergency Use Authorization (EUA). This EUA will remain in effect (meaning this test can be used) for the duration of the COVID-19 declaration under Section 564(b)(1) of the Act, 21 U.S.C. section 360bbb-3(b)(1), unless the authorization is terminated or revoked.     Resp Syncytial Virus by PCR NEGATIVE NEGATIVE Final    Comment: (NOTE) Fact Sheet for Patients: BloggerCourse.com  Fact Sheet for Healthcare  Providers: SeriousBroker.it  This test is not yet approved or cleared by the [place]  FDA and has been authorized for detection and/or diagnosis of SARS-CoV-2 by FDA under an Emergency Use Authorization (EUA). This EUA will remain in effect (meaning this test can be used) for  the duration of the COVID-19 declaration under Section 564(b)(1) of the Act, 21 U.S.C. section 360bbb-3(b)(1), unless the authorization is terminated or revoked.  Performed at [facility], [address], [place], [place] [address]   MRSA Next Gen by PCR, Nasal     Status: None   Collection Time: 01/15/24  1:01 AM   Specimen: Nasal Mucosa; Nasal Swab  Result Value Ref Range Status   MRSA by PCR Next Gen NOT DETECTED NOT DETECTED Final    Comment: (NOTE) The GeneXpert MRSA Assay (FDA approved for NASAL specimens only), is one component of a comprehensive MRSA colonization surveillance program. It is not intended to diagnose MRSA infection nor to guide or monitor treatment for MRSA infections. Test performance is not FDA approved in patients less than 38 years old. Performed at [facility], [address], [place], [place] [address]   Culture, blood (Routine X 2) w Reflex to ID Panel     Status: None (Preliminary result)   Collection Time: 01/15/24  5:33 PM   Specimen: BLOOD RIGHT HAND  Result Value Ref Range Status   Specimen Description BLOOD RIGHT HAND  Final   Special Requests   Final    BOTTLES DRAWN AEROBIC ONLY Blood Culture results may not be optimal due to an inadequate volume of blood received in culture bottles   Culture   Final    NO GROWTH 3 DAYS Performed at [facility], [address], [place], [place] [address]    Report Status PENDING  Incomplete  Culture, blood (Routine X 2) w Reflex to ID Panel     Status: None (Preliminary result)   Collection Time: 01/15/24  5:33 PM   Specimen: BLOOD RIGHT HAND  Result Value Ref Range Status    Specimen Description BLOOD RIGHT HAND  Final   Special Requests   Final    BOTTLES DRAWN AEROBIC ONLY Blood Culture results may not be optimal due to an inadequate volume of blood received in culture bottles   Culture   Final    NO GROWTH 3 DAYS Performed at [facility] [place] [facility], [address], [place], [place] [address]    Report Status PENDING  Incomplete    I have personally spent 27 minutes involved in face-to-face and non-face-to-face activities for this patient on the day of the visit. Professional time spent includes the following activities: preparing to see the patient (review of tests), performing a medically appropriate examination, ordering medications, communicating with other health care professionals, documenting clinical information in the EMR, communicating results and counseling patient regarding medication and plan of care, and care coordination.    Greg Lark Runk, NP [facility]  01/18/2024  11:11 AM

## 2024-01-18 NOTE — Progress Notes (Signed)
   [facility] has been requested to perform a transesophageal echocardiogram on Calvin Schultz for bacteremia.     The patient does NOT have any absolute or relative contraindications to a Transesophageal Echocardiogram (TEE).  The patient has: Current Oxygen Requirement    After careful review of history and examination, the risks and benefits of transesophageal echocardiogram have been explained including risks of esophageal damage, perforation (1:10,000 risk), bleeding, pharyngeal hematoma as well as other potential complications associated with conscious sedation including aspiration, arrhythmia, respiratory failure and death. Alternatives to treatment were discussed, questions were answered. Patient is willing to proceed.   Signed, Waddell DELENA Donath, PA-C  01/18/2024 3:27 PM

## 2024-01-18 NOTE — Progress Notes (Signed)
 PROGRESS NOTE  Calvin Schultz FMW:[id number] DOB: [birth year] DOA: 01/14/2024 PCP: Jesus Elberta Gainer, FNP  HPI/Recap of past 24 hours: Patient is a 72 yo M w/ pertinent PMH ESRD on HD MWF, T2DM, chronic A-fib, diastolic CHF, HTN, HLD presents to [facility] on 9/11 with AMS. Per patient's wife patient noted to be progressively more altered with poor po intake. In the ED, pt noted to be febrile 100.3 F and hypotensive. Labs showed Wbc 47 and LA 3.6. CXR w/ b/l infiltrate vs. atelectasis. B/l leg wound present. CT head negative for acute abnormality. Despite iv fluids patient remained hypotensive started on levo. Of note, pt follows outpt w/ [facility] for b/l leg venous stasis ulcer for debridement and wound care. Had dvt us  and abi of ble on 9/11 which were normal.  Pccm consulted for icu admission.  While in the ICU, patient was subsequently weaned off pressors on 9/12.  Patient further stabilized and [facility] resumed care on 9/14.    Today, patient denies any new complaints, continues to feel better.  Still on about 2 L of O2 this morning.    Assessment/Plan: Principal Problem:   Septic shock (HCC) Active Problems:   ESRD on hemodialysis (HCC)   MSSA bacteremia   Septic shock MSSA bacteremia likely 2/2 BLE wound/calciphylaxis Currently afebrile, with resolving leukocytosis BP better controlled, off pressors since 9/12 Lactic acidosis has resolved Blood cultures growing MSSA, repeat BC x 2 no growth till date UA was never collected X-rays of bilateral foot were negative for osteomyelitis ID on board, recommend removal of tunneled HD catheter Vascular surgery consulted, s/p TDC removed on 01/18/2024 TTE with EF of 55%, no regional wall motion abnormality, noted right ventricular volume overload.  No evidence of valvular vegetation noted Plan for TEE, cardiology consulted Continue midodrine  Continue cefazolin  Monitor closely   Acute metabolic encephalopathy Resolved Likely  from septic shock CT negative for acute abnormality Restarted gabapentin , norco/vicodin, zanaflex , and trazodone  Voltaren  gel as needed for shoulders pain   ESRD on HD MWF BLE calciphylaxis Consulted nephrology Daily renal panel  Anemia of ESRD Hb somewhat around baseline Anemia panel showed iron  52, ferritin 1284, sats 27 Daily CBC  Diastolic CHF HTN HLD Volume management by HD as well as Lasix  Continue Lasix    T2DM A1c 5.6 SSI, Accu-Cheks, hypoglycemia protocol   Chronic A-fib  Rate controlled, not on anticoagulation due to previous acute blood loss from hematuria Not on rate controlling medication  Obesity class I Lifestyle modification advised     Pressure Injury 10/01/23 Heel Left (Active)  10/01/23 2045  Location: Heel  Location Orientation: Left  Staging:   Wound Description (Comments):   DO NOT USE:  Present on Admission: Yes  Dressing Type Foam - Lift dressing to assess site every shift 01/17/24 1945      Estimated body mass index is 34.06 kg/m as calculated from the following:   Height as of this encounter: 5' 8 (1.727 m).   Weight as of this encounter: 101.6 kg.     Code Status: Full  Family Communication: None at bedside  Disposition Plan: Status is: Inpatient Remains inpatient appropriate because: Level of care      Consultants: PCCM ID Vascular surgery  Procedures: TDC removal on 01/18/24  Antimicrobials: Cefazolin   DVT prophylaxis: Heparin  [place]   Objective: Vitals:   01/18/24 0014 01/18/24 0445 01/18/24 0737 01/18/24 1428  BP: (!) 91/56 123/68 (!) 100/52 (!) 110/59  Pulse: 60 60 62 61  Resp: 16 16  16 16  Temp: 98 F (36.7 C) 98.1 F (36.7 C) 97.6 F (36.4 C) 97.8 F (36.6 C)  TempSrc: Oral  Oral Oral  SpO2: 98% 100% 92% 98%  Weight:      Height:        Intake/Output Summary (Last 24 hours) at 01/18/2024 1608 Last data filed at 01/18/2024 1333 Gross per 24 hour  Intake 50 ml  Output 200 ml  Net -150 ml    Filed Weights   01/15/24 2205 01/16/24 0410 01/17/24 0326  Weight: 102.5 kg 101 kg 101.6 kg       Exam: General: NAD  Cardiovascular: S1, S2 present Respiratory: CTAB Abdomen: Soft, nontender, nondistended, bowel sounds present Musculoskeletal: No bilateral pedal edema noted, noted BLE dressing C/D/I Skin: Noted BLE calciphylaxis Psychiatry: Normal mood     Data Reviewed: CBC: Recent Labs  Lab 01/14/24 2018 01/14/24 2031 01/15/24 0858 01/16/24 0303 01/17/24 0220 01/18/24 0454  WBC 47.4*  --  40.5* 24.7* 14.1* 15.0*  NEUTROABS 44.6*  --   --   --  11.5* 11.6*  HGB 8.1* 9.5* 7.9* 8.9* 7.2* 7.8*  HCT 26.1* 28.0* 25.2* 28.9* 23.4* 26.3*  MCV 92.6  --  92.6 92.0 92.1 93.3  PLT 229  --  181 174 170 169   Basic Metabolic Panel: Recent Labs  Lab 01/14/24 2018 01/14/24 2031 01/15/24 0659 01/16/24 0303 01/17/24 0220 01/18/24 0454  NA 134* 133* 132* 134* 134* 137  K 4.6 4.7 5.5* 4.2 4.4 4.9  CL 96* 96* 94* 96* 96* 96*  CO2 23  --  16* 23 22 24   GLUCOSE 160* 160* 114* 91 90 80  BUN 27* 31* 31* 26* 41* 53*  CREATININE 4.07* 4.10* 4.38* 3.55* 4.56* 5.50*  CALCIUM  8.7*  --  8.6* 8.6* 8.3* 8.6*  MG  --   --  1.7  --   --   --   PHOS  --   --   --   --   --  6.5*   GFR: Estimated Creatinine Clearance: 14.4 mL/min (A) (by C-G formula based on SCr of 5.5 mg/dL (H)). Liver Function Tests: Recent Labs  Lab 01/14/24 2018 01/18/24 0454  AST 20  --   ALT 11  --   ALKPHOS 105  --   BILITOT 1.7*  --   PROT 6.4*  --   ALBUMIN  2.4* 2.1*   No results for input(s): LIPASE, AMYLASE in the last 168 hours. No results for input(s): AMMONIA in the last 168 hours. Coagulation Profile: Recent Labs  Lab 01/14/24 2018  INR 1.3*   Cardiac Enzymes: No results for input(s): CKTOTAL, CKMB, CKMBINDEX, TROPONINI in the last 168 hours. BNP (last 3 results) No results for input(s): PROBNP in the last 8760 hours. HbA1C: No results for input(s): HGBA1C in the  last 72 hours.  CBG: Recent Labs  Lab 01/17/24 2001 01/18/24 0015 01/18/24 0416 01/18/24 0932 01/18/24 1141  GLUCAP 120* 93 84 107* 79   Lipid Profile: No results for input(s): CHOL, HDL, LDLCALC, TRIG, CHOLHDL, LDLDIRECT in the last 72 hours. Thyroid Function Tests: No results for input(s): TSH, T4TOTAL, FREET4, T3FREE, THYROIDAB in the last 72 hours. Anemia Panel: Recent Labs    01/18/24 0454  VITAMINB12 1,000*  FOLATE 12.9  FERRITIN 1,284*  TIBC 192*  IRON  52   Urine analysis:    Component Value Date/Time   COLORURINE RED (A) 08/04/2023 1434   APPEARANCEUR TURBID (A) 08/04/2023 1434   LABSPEC  08/04/2023 1434  TEST NOT REPORTED DUE TO COLOR INTERFERENCE OF URINE PIGMENT   PHURINE  08/04/2023 1434    TEST NOT REPORTED DUE TO COLOR INTERFERENCE OF URINE PIGMENT   GLUCOSEU (A) 08/04/2023 1434    TEST NOT REPORTED DUE TO COLOR INTERFERENCE OF URINE PIGMENT   HGBUR (A) 08/04/2023 1434    TEST NOT REPORTED DUE TO COLOR INTERFERENCE OF URINE PIGMENT   BILIRUBINUR (A) 08/04/2023 1434    TEST NOT REPORTED DUE TO COLOR INTERFERENCE OF URINE PIGMENT   KETONESUR (A) 08/04/2023 1434    TEST NOT REPORTED DUE TO COLOR INTERFERENCE OF URINE PIGMENT   PROTEINUR (A) 08/04/2023 1434    TEST NOT REPORTED DUE TO COLOR INTERFERENCE OF URINE PIGMENT   NITRITE (A) 08/04/2023 1434    TEST NOT REPORTED DUE TO COLOR INTERFERENCE OF URINE PIGMENT   LEUKOCYTESUR (A) 08/04/2023 1434    TEST NOT REPORTED DUE TO COLOR INTERFERENCE OF URINE PIGMENT   Sepsis Labs: @LABRCNTIP (procalcitonin:4,lacticidven:4)  ) Recent Results (from the past 240 hours)  Culture, blood (Routine x 2)     Status: Abnormal   Collection Time: 01/14/24  8:20 PM   Specimen: BLOOD RIGHT FOREARM  Result Value Ref Range Status   Specimen Description BLOOD RIGHT FOREARM  Final   Special Requests   Final    BOTTLES DRAWN AEROBIC AND ANAEROBIC Blood Culture results may not be optimal due to an  inadequate volume of blood received in culture bottles   Culture  Setup Time   Final    GRAM POSITIVE COCCI IN CLUSTERS IN BOTH AEROBIC AND ANAEROBIC BOTTLES CRITICAL VALUE NOTED.  VALUE IS CONSISTENT WITH PREVIOUSLY REPORTED AND CALLED VALUE.    Culture (A)  Final    STAPHYLOCOCCUS AUREUS SUSCEPTIBILITIES PERFORMED ON PREVIOUS CULTURE WITHIN THE LAST 5 DAYS. Performed at [facility], [address] [place], [place] [address]    Report Status 01/17/2024 FINAL  Final  Culture, blood (Routine x 2)     Status: Abnormal   Collection Time: 01/14/24  8:23 PM   Specimen: BLOOD RIGHT FOREARM  Result Value Ref Range Status   Specimen Description BLOOD RIGHT FOREARM  Final   Special Requests   Final    BOTTLES DRAWN AEROBIC AND ANAEROBIC Blood Culture adequate volume   Culture  Setup Time   Final    GRAM POSITIVE COCCI IN CLUSTERS IN BOTH AEROBIC AND ANAEROBIC BOTTLES CRITICAL RESULT CALLED TO, READ BACK BY AND VERIFIED WITH: MAYA Feliciano ORN on [id number] @0905  by SM Performed at [facility], [address], [place], [place] [address]    Culture STAPHYLOCOCCUS AUREUS (A)  Final   Report Status 01/17/2024 FINAL  Final   Organism ID, Bacteria STAPHYLOCOCCUS AUREUS  Final      Susceptibility   Staphylococcus aureus - MIC*    CIPROFLOXACIN <=0.5 SENSITIVE Sensitive     ERYTHROMYCIN <=0.25 SENSITIVE Sensitive     GENTAMICIN <=0.5 SENSITIVE Sensitive     OXACILLIN 0.5 SENSITIVE Sensitive     TETRACYCLINE <=1 SENSITIVE Sensitive     VANCOMYCIN  <=0.5 SENSITIVE Sensitive     TRIMETH /SULFA  <=10 SENSITIVE Sensitive     CLINDAMYCIN <=0.25 SENSITIVE Sensitive     RIFAMPIN <=0.5 SENSITIVE Sensitive     Inducible Clindamycin NEGATIVE Sensitive     LINEZOLID 2 SENSITIVE Sensitive     * STAPHYLOCOCCUS AUREUS  Blood Culture ID Panel (Reflexed)     Status: Abnormal   Collection Time: 01/14/24  8:23 PM  Result Value  Ref Range Status   Enterococcus faecalis NOT DETECTED NOT DETECTED Final    Enterococcus Faecium NOT DETECTED NOT DETECTED Final   Listeria monocytogenes NOT DETECTED NOT DETECTED Final   Staphylococcus species DETECTED (A) NOT DETECTED Final    Comment: CRITICAL RESULT CALLED TO, READ BACK BY AND VERIFIED WITH: MAYA Feliciano ORN on [id number] @0905  by SM    Staphylococcus aureus (BCID) DETECTED (A) NOT DETECTED Final    Comment: CRITICAL RESULT CALLED TO, READ BACK BY AND VERIFIED WITH: MAYA Feliciano ORN on [id number] @0905  by SM    Staphylococcus epidermidis NOT DETECTED NOT DETECTED Final   Staphylococcus lugdunensis NOT DETECTED NOT DETECTED Final   Streptococcus species NOT DETECTED NOT DETECTED Final   Streptococcus agalactiae NOT DETECTED NOT DETECTED Final   Streptococcus pneumoniae NOT DETECTED NOT DETECTED Final   Streptococcus pyogenes NOT DETECTED NOT DETECTED Final   A.calcoaceticus-baumannii NOT DETECTED NOT DETECTED Final   Bacteroides fragilis NOT DETECTED NOT DETECTED Final   Enterobacterales NOT DETECTED NOT DETECTED Final   Enterobacter cloacae complex NOT DETECTED NOT DETECTED Final   Escherichia coli NOT DETECTED NOT DETECTED Final   Klebsiella aerogenes NOT DETECTED NOT DETECTED Final   Klebsiella oxytoca NOT DETECTED NOT DETECTED Final   Klebsiella pneumoniae NOT DETECTED NOT DETECTED Final   Proteus species NOT DETECTED NOT DETECTED Final   Salmonella species NOT DETECTED NOT DETECTED Final   Serratia marcescens NOT DETECTED NOT DETECTED Final   Haemophilus influenzae NOT DETECTED NOT DETECTED Final   Neisseria meningitidis NOT DETECTED NOT DETECTED Final   Pseudomonas aeruginosa NOT DETECTED NOT DETECTED Final   Stenotrophomonas maltophilia NOT DETECTED NOT DETECTED Final   Candida albicans NOT DETECTED NOT DETECTED Final   Candida auris NOT DETECTED NOT DETECTED Final   Candida glabrata NOT DETECTED NOT DETECTED Final   Candida krusei NOT DETECTED NOT DETECTED Final   Candida parapsilosis NOT DETECTED NOT DETECTED Final   Candida tropicalis  NOT DETECTED NOT DETECTED Final   Cryptococcus neoformans/gattii NOT DETECTED NOT DETECTED Final   Meth resistant mecA/C and MREJ NOT DETECTED NOT DETECTED Final    Comment: Performed at [facility], [address] [place], [place] [address]  Resp panel by RT-PCR (RSV, Flu A&B, Covid) Anterior Nasal Swab     Status: None   Collection Time: 01/14/24  9:30 PM   Specimen: Anterior Nasal Swab  Result Value Ref Range Status   SARS Coronavirus 2 by RT PCR NEGATIVE NEGATIVE Final   Influenza A by PCR NEGATIVE NEGATIVE Final   Influenza B by PCR NEGATIVE NEGATIVE Final    Comment: (NOTE) The Xpert Xpress SARS-CoV-2/FLU/RSV plus assay is intended as an aid in the diagnosis of influenza from Nasopharyngeal swab specimens and should not be used as a sole basis for treatment. Nasal washings and aspirates are unacceptable for Xpert Xpress SARS-CoV-2/FLU/RSV testing.  Fact Sheet for Patients: BloggerCourse.com  Fact Sheet for Healthcare Providers: SeriousBroker.it  This test is not yet approved or cleared by the [place]  FDA and has been authorized for detection and/or diagnosis of SARS-CoV-2 by FDA under an Emergency Use Authorization (EUA). This EUA will remain in effect (meaning this test can be used) for the duration of the COVID-19 declaration under Section 564(b)(1) of the Act, 21 U.S.C. section 360bbb-3(b)(1), unless the authorization is terminated or revoked.     Resp Syncytial Virus by PCR NEGATIVE NEGATIVE Final    Comment: (NOTE) Fact Sheet for Patients: BloggerCourse.com  Fact Sheet for Healthcare Providers:  SeriousBroker.it  This test is not yet approved or cleared by the [place]  FDA and has been authorized for detection and/or diagnosis of SARS-CoV-2 by FDA under an Emergency Use Authorization (EUA). This EUA will remain in effect (meaning this test can be  used) for the duration of the COVID-19 declaration under Section 564(b)(1) of the Act, 21 U.S.C. section 360bbb-3(b)(1), unless the authorization is terminated or revoked.  Performed at [facility], [address] [place], [place] [address]   MRSA Next Gen by PCR, Nasal     Status: None   Collection Time: 01/15/24  1:01 AM   Specimen: Nasal Mucosa; Nasal Swab  Result Value Ref Range Status   MRSA by PCR Next Gen NOT DETECTED NOT DETECTED Final    Comment: (NOTE) The GeneXpert MRSA Assay (FDA approved for NASAL specimens only), is one component of a comprehensive MRSA colonization surveillance program. It is not intended to diagnose MRSA infection nor to guide or monitor treatment for MRSA infections. Test performance is not FDA approved in patients less than 52 years old. Performed at [facility], [address] [place], [place] [address]   Culture, blood (Routine X 2) w Reflex to ID Panel     Status: None (Preliminary result)   Collection Time: 01/15/24  5:33 PM   Specimen: BLOOD RIGHT HAND  Result Value Ref Range Status   Specimen Description BLOOD RIGHT HAND  Final   Special Requests   Final    BOTTLES DRAWN AEROBIC ONLY Blood Culture results may not be optimal due to an inadequate volume of blood received in culture bottles   Culture   Final    NO GROWTH 3 DAYS Performed at [facility], [address], [place], [place] [address]    Report Status PENDING  Incomplete  Culture, blood (Routine X 2) w Reflex to ID Panel     Status: None (Preliminary result)   Collection Time: 01/15/24  5:33 PM   Specimen: BLOOD RIGHT HAND  Result Value Ref Range Status   Specimen Description BLOOD RIGHT HAND  Final   Special Requests   Final    BOTTLES DRAWN AEROBIC ONLY Blood Culture results may not be optimal due to an inadequate volume of blood received in culture bottles   Culture   Final    NO GROWTH 3 DAYS Performed at [facility], [address],  [place], [place] [address]    Report Status PENDING  Incomplete      Studies: No results found.   Scheduled Meds:  Chlorhexidine  Gluconate Cloth  6 each Topical Q0600   darbepoetin (ARANESP ) injection - DIALYSIS  150 mcg Subcutaneous Q Mon-1800   furosemide   80 mg Intravenous BID   gabapentin   100 mg Oral QHS   heparin   5,000 Units Subcutaneous Q8H   insulin  aspart  0-9 Units Subcutaneous Q4H   midodrine   10 mg Oral Q8H   sevelamer  carbonate  800 mg Oral TID WC   silver  sulfADIAZINE    Topical Daily   sodium chloride  flush  10-40 mL Intracatheter Q12H    Continuous Infusions:   ceFAZolin  (ANCEF ) IV 1 g (01/18/24 1048)   sodium thiosulfate  25 g in sodium chloride  0.9 % 200 mL Infusion for Calciphylaxis       LOS: 3 days     Lebron JINNY Cage, MD [facility]  If 7PM-7AM, please contact night-coverage [email or web address] 01/18/2024, 4:08 PM

## 2024-01-18 NOTE — Care Management Important Message (Signed)
 Important Message  Patient Details  Name: Calvin Schultz MRN: [id number] Date of Birth: 08-13-1950   Important Message Given:  Yes - Medicare IM     Jon Cruel 01/18/2024, 11:53 AM

## 2024-01-18 NOTE — Progress Notes (Signed)
  Procedure Note   PRE-OPERATIVE DIAGNOSIS:   ESRD   POST-OPERATIVE DIAGNOSIS:  same.  PROCEDURE:  Removal of Right IJ perm cath.   PROVIDER:   Donnice Sender PA-C.  ANESTHESIA:  Local  The risks and benefits of this procedure were explained to the patient and the patient gave verbal consent   OPERATIVE PROCEDURE:  The following procedure was performed at the bedside.  The right side of patient's neck and chest was prepped and draped in standard fashion.  Local anesthesia was infiltrated over the tunneled catheter and its cuff.  The cuff was loosened using a combination of blunt and sharp dissection.  The catheter was removed in its entirety, and hemostasis was achieved with local compression.    The patient tolerated the procedure well and did not have any complications.   Donnice Sender, PA-C [facility] [phone] 01/18/2024  2:59 PM

## 2024-01-19 ENCOUNTER — Encounter (HOSPITAL_COMMUNITY)
Admission: EM | Disposition: A | Discharge status: Home Health | Payer: Self-pay | Source: Home / Self Care | Attending: Internal Medicine

## 2024-01-19 ENCOUNTER — Inpatient Hospital Stay (HOSPITAL_COMMUNITY): Admitting: Registered Nurse

## 2024-01-19 ENCOUNTER — Inpatient Hospital Stay (HOSPITAL_COMMUNITY)

## 2024-01-19 DIAGNOSIS — I120 Hypertensive chronic kidney disease with stage 5 chronic kidney disease or end stage renal disease: Secondary | ICD-10-CM

## 2024-01-19 DIAGNOSIS — R7881 Bacteremia: Secondary | ICD-10-CM

## 2024-01-19 DIAGNOSIS — I482 Chronic atrial fibrillation, unspecified: Secondary | ICD-10-CM

## 2024-01-19 DIAGNOSIS — N186 End stage renal disease: Secondary | ICD-10-CM

## 2024-01-19 DIAGNOSIS — A419 Sepsis, unspecified organism: Secondary | ICD-10-CM | POA: Diagnosis not present

## 2024-01-19 DIAGNOSIS — R6521 Severe sepsis with septic shock: Secondary | ICD-10-CM | POA: Diagnosis not present

## 2024-01-19 DIAGNOSIS — Z992 Dependence on renal dialysis: Secondary | ICD-10-CM

## 2024-01-19 DIAGNOSIS — I340 Nonrheumatic mitral (valve) insufficiency: Secondary | ICD-10-CM

## 2024-01-19 HISTORY — PX: TRANSESOPHAGEAL ECHOCARDIOGRAM (CATH LAB): EP1270

## 2024-01-19 LAB — CBC WITH DIFFERENTIAL/PLATELET
Abs Immature Granulocytes: 0.24 K/uL — ABNORMAL HIGH (ref 0.00–0.07)
Basophils Absolute: 0.1 K/uL (ref 0.0–0.1)
Basophils Relative: 0 %
Eosinophils Absolute: 0.5 K/uL (ref 0.0–0.5)
Eosinophils Relative: 3 %
HCT: 24.9 % — ABNORMAL LOW (ref 39.0–52.0)
Hemoglobin: 7.6 g/dL — ABNORMAL LOW (ref 13.0–17.0)
Immature Granulocytes: 2 %
Lymphocytes Relative: 9 %
Lymphs Abs: 1.5 K/uL (ref 0.7–4.0)
MCH: 28 pg (ref 26.0–34.0)
MCHC: 30.5 g/dL (ref 30.0–36.0)
MCV: 91.9 fL (ref 80.0–100.0)
Monocytes Absolute: 1.8 K/uL — ABNORMAL HIGH (ref 0.1–1.0)
Monocytes Relative: 11 %
Neutro Abs: 12.2 K/uL — ABNORMAL HIGH (ref 1.7–7.7)
Neutrophils Relative %: 75 %
Platelets: 180 K/uL (ref 150–400)
RBC: 2.71 MIL/uL — ABNORMAL LOW (ref 4.22–5.81)
RDW: 16.4 % — ABNORMAL HIGH (ref 11.5–15.5)
WBC: 16.3 K/uL — ABNORMAL HIGH (ref 4.0–10.5)
nRBC: 0 % (ref 0.0–0.2)

## 2024-01-19 LAB — RENAL FUNCTION PANEL
Albumin: 2.1 g/dL — ABNORMAL LOW (ref 3.5–5.0)
Anion gap: 16 — ABNORMAL HIGH (ref 5–15)
BUN: 62 mg/dL — ABNORMAL HIGH (ref 8–23)
CO2: 22 mmol/L (ref 22–32)
Calcium: 8.5 mg/dL — ABNORMAL LOW (ref 8.9–10.3)
Chloride: 97 mmol/L — ABNORMAL LOW (ref 98–111)
Creatinine, Ser: 6.4 mg/dL — ABNORMAL HIGH (ref 0.61–1.24)
GFR, Estimated: 9 mL/min — ABNORMAL LOW (ref >60)
Glucose, Bld: 85 mg/dL (ref 70–99)
Phosphorus: 7.3 mg/dL — ABNORMAL HIGH (ref 2.5–4.6)
Potassium: 5.1 mmol/L (ref 3.5–5.1)
Sodium: 135 mmol/L (ref 135–145)

## 2024-01-19 LAB — GLUCOSE, CAPILLARY
Glucose-Capillary: 100 mg/dL — ABNORMAL HIGH (ref 70–99)
Glucose-Capillary: 104 mg/dL — ABNORMAL HIGH (ref 70–99)
Glucose-Capillary: 112 mg/dL — ABNORMAL HIGH (ref 70–99)
Glucose-Capillary: 80 mg/dL (ref 70–99)
Glucose-Capillary: 84 mg/dL (ref 70–99)
Glucose-Capillary: 90 mg/dL (ref 70–99)
Glucose-Capillary: 90 mg/dL (ref 70–99)

## 2024-01-19 SURGERY — TRANSESOPHAGEAL ECHOCARDIOGRAM (TEE) (CATHLAB)
Anesthesia: Monitor Anesthesia Care

## 2024-01-19 MED ORDER — PHENYLEPHRINE 80 MCG/ML (10ML) SYRINGE FOR IV PUSH (FOR BLOOD PRESSURE SUPPORT)
PREFILLED_SYRINGE | INTRAVENOUS | Status: DC | PRN
  Administered 2024-01-19 (×11): 160 ug via INTRAVENOUS

## 2024-01-19 MED ORDER — LIDOCAINE HCL (CARDIAC) PF 100 MG/5ML IV SOSY
PREFILLED_SYRINGE | INTRAVENOUS | Status: DC | PRN
  Administered 2024-01-19: 80 mg via INTRATRACHEAL

## 2024-01-19 MED ORDER — SODIUM CHLORIDE 0.9 % IV SOLN: INTRAVENOUS | Status: DC

## 2024-01-19 MED ORDER — PROPOFOL 500 MG/50ML IV EMUL
INTRAVENOUS | Status: DC | PRN
  Administered 2024-01-19: 120 ug/kg/min via INTRAVENOUS

## 2024-01-19 MED ORDER — PROPOFOL 10 MG/ML IV BOLUS
INTRAVENOUS | Status: DC | PRN
  Administered 2024-01-19: 20 mg via INTRAVENOUS

## 2024-01-19 MED ORDER — EPHEDRINE SULFATE-NACL 50-0.9 MG/10ML-% IV SOSY
PREFILLED_SYRINGE | INTRAVENOUS | Status: DC | PRN
  Administered 2024-01-19 (×4): 5 mg via INTRAVENOUS

## 2024-01-19 NOTE — Anesthesia Preprocedure Evaluation (Signed)
 Anesthesia Evaluation  Patient identified by MRN, date of birth, ID band Patient awake    Reviewed: Allergy & Precautions, H&P , NPO status , Patient's Chart, lab work & pertinent test results  History of Anesthesia Complications Negative for: history of anesthetic complications  Airway Mallampati: II  TM Distance: >3 FB Neck ROM: full    Dental no notable dental hx.    Pulmonary neg pulmonary ROS   breath sounds clear to auscultation       Cardiovascular hypertension, (-) angina (-) Past MI + dysrhythmias Atrial Fibrillation  Rhythm:regular Rate:Normal   1. Left ventricular ejection fraction, by estimation, is 60 to 65%. The  left ventricle has normal function. The left ventricle has no regional  wall motion abnormalities. The left ventricular internal cavity size was  moderately dilated. There is moderate   left ventricular hypertrophy. Left ventricular diastolic parameters were  normal.   2. Right ventricular systolic function is normal. The right ventricular  size is mildly enlarged.   3. Left atrial size was mildly dilated.   4. Right atrial size was mildly dilated.   5. The mitral valve is normal in structure. Mild mitral valve  regurgitation. No evidence of mitral stenosis.   6. The aortic valve is tricuspid. Aortic valve regurgitation is not  visualized. Aortic valve sclerosis is present, with no evidence of aortic  valve stenosis.   7. Aortic dilatation noted. There is borderline dilatation of the aortic  root, measuring 38 mm.   8. The inferior vena cava is normal in size with greater than 50%  respiratory variability, suggesting right atrial pressure of 3 mmHg.     Neuro/Psych  PSYCHIATRIC DISORDERS Anxiety Depression    negative neurological ROS     GI/Hepatic negative GI ROS, Neg liver ROS,,,  Endo/Other  diabetes, Type 2   K 5.1  Renal/GU ESRF and DialysisRenal disease     Musculoskeletal  (+)  Arthritis ,    Abdominal   Peds  Hematology  (+) Blood dyscrasia, anemia   Anesthesia Other Findings TEE for endocarditis evaluation in the setting of bacteremia   Reproductive/Obstetrics                              Anesthesia Physical Anesthesia Plan  ASA: 3  Anesthesia Plan: MAC   Post-op Pain Management:    Induction: Intravenous  PONV Risk Score and Plan: 2 and Ondansetron , Treatment may vary due to age or medical condition and TIVA  Airway Management Planned: Natural Airway  Additional Equipment: None  Intra-op Plan:   Post-operative Plan:   Informed Consent: I have reviewed the patients History and Physical, chart, labs and discussed the procedure including the risks, benefits and alternatives for the proposed anesthesia with the patient or authorized representative who has indicated his/her understanding and acceptance.     Dental advisory given  Plan Discussed with: CRNA  Anesthesia Plan Comments:         Anesthesia Quick Evaluation

## 2024-01-19 NOTE — Interval H&P Note (Signed)
 History and Physical Interval Note:  01/19/2024 8:31 AM  [name]  has presented today for surgery, with the diagnosis of Bacteremia.  The various methods of treatment have been discussed with the patient and family. After consideration of risks, benefits and other options for treatment, the patient has consented to  Procedure(s): TRANSESOPHAGEAL ECHOCARDIOGRAM (N/A) as a surgical intervention.  The patient's history has been reviewed, patient examined, no change in status, stable for surgery.  I have reviewed the patient's chart and labs.  Questions were answered to the patient's satisfaction.     Calvin Schultz

## 2024-01-19 NOTE — Plan of Care (Signed)

## 2024-01-19 NOTE — Consult Note (Signed)
 Hospital Consult    Reason for Consult: Need for dialysis access Referring Physician: Lamarr Boehringer, [place] MRN #:  [id number]  History of Present Illness: This is a 73 y.o. male has a history of end-stage renal disease with for stage basilic vein fistula in the left arm.  He was on dialysis via tunneled dialysis catheter that was replaced in July.  This was removed yesterday with concern for infection with MSSA bacteremia now cleared by infectious disease for new access.  Past Medical History:  Diagnosis Date   Arthritis    Atrial fibrillation (HCC)    CKD (chronic kidney disease) stage 3, GFR 30-59 ml/min (HCC)    Diabetes mellitus without complication (HCC)    Dysrhythmia    Hyperlipidemia    Hypertension     Past Surgical History:  Procedure Laterality Date   AV FISTULA PLACEMENT Left 10/09/2023   Procedure: ARTERIOVENOUS (AV) FISTULA CREATION, FIRST STAGE BASILIC;  Surgeon: Pearline Norman RAMAN, MD;  Location: [facility] OR;  Service: Vascular;  Laterality: Left;  LEFT ARM AVF VERSUS AVG   CYSTOSCOPY W/ URETERAL STENT PLACEMENT Right 08/05/2023   Procedure: CYSTOSCOPY, WITH RETROGRADE PYELOGRAM AND URETERAL STENT INSERTION;  Surgeon: Roseann Adine PARAS., MD;  Location: [facility] OR;  Service: Urology;  Laterality: Right;   CYSTOSCOPY/URETEROSCOPY/HOLMIUM LASER/STENT PLACEMENT Left 07/24/2023   Procedure: CYSTOSCOPY/URETEROSCOPY/HOLMIUM LASER/ STENT PLACEMENT/REMOVAL OF NEPHROSTOMY TUBE;  Surgeon: Nieves Cough, MD;  Location: [facility];  Service: Urology;  Laterality: Left;  90 MINUTE CASE   CYSTOSCOPY/URETEROSCOPY/HOLMIUM LASER/STENT PLACEMENT Bilateral 08/18/2023   Procedure: CYSTOSCOPY/URETEROSCOPY/HOLMIUM LASER/STENT PLACEMENT;  Surgeon: Nieves Cough, MD;  Location: [facility] OR;  Service: Urology;  Laterality: Bilateral;   INSERTION OF DIALYSIS CATHETER Right 10/09/2023   Procedure: INSERTION OF DIALYSIS CATHETER, USING PALINDROME CATHETER KIT 19CM;  Surgeon: Pearline Norman RAMAN, MD;  Location: [facility] OR;   Service: Vascular;  Laterality: Right;  TUNNELED DIALYSIS CATHETER   IR NEPHROSTOMY PLACEMENT LEFT  06/03/2023   KIDNEY SURGERY     TUNNELLED CATHETER EXCHANGE N/A 11/20/2023   Procedure: TUNNELLED CATHETER EXCHANGE;  Surgeon: Sheree Penne Bruckner, MD;  Location: [facility];  Service: Cardiovascular;  Laterality: N/A;    No Known Allergies  Prior to Admission medications   Medication Sig Start Date End Date Taking? Authorizing Provider  acetaminophen  (TYLENOL ) 325 MG tablet Take 2 tablets (650 mg total) by mouth every 4 (four) hours as needed for mild pain (pain score 1-3) or fever. 08/05/23  Yes Angiulli, Toribio PARAS, PA-C  furosemide  (LASIX ) 80 MG tablet Take 1 tablet (80 mg total) by mouth See admin instructions. Take 1 tablet (80 mg) by mouth every Tuesday, Thursday, Saturday, and Sunday 10/23/23 10/22/24 Yes Foust, Katy L, NP  gabapentin  (NEURONTIN ) 100 MG capsule Take 100 mg by mouth at bedtime. 12/29/23  Yes [provider]  HYDROcodone -acetaminophen  (NORCO/VICODIN) 5-325 MG tablet Take 1 tablet by mouth every 6 (six) hours as needed for moderate pain (pain score 4-6). 11/12/23  Yes [provider]  iron  polysaccharides (NIFEREX) 150 MG capsule Take 1 capsule (150 mg total) by mouth daily. 07/11/23  Yes Vann, Jessica U, DO  LANTUS SOLOSTAR 100 UNIT/ML Solostar Pen Inject 3-4 Units into the skin daily as needed (for hyperglycemia). 11/02/23  Yes [provider]  multivitamin (RENA-VIT) TABS tablet Take 1 tablet by mouth at bedtime. 08/05/23  Yes Angiulli, Daniel J, PA-C  Nystatin (GERHARDT'S BUTT CREAM) CREA Apply 1 Application topically 2 (two) times daily. 08/20/23  Yes Angiulli, Toribio PARAS, PA-C  psyllium (REGULOID) 0.52  g capsule Take 4 capsules by mouth daily.   Yes [provider]  silver  sulfADIAZINE  (SILVADENE ) 1 % cream Apply 1 Application topically daily. Apply to legs 11/21/23 07/10/24 Yes [provider]  tamsulosin  (FLOMAX ) 0.4 MG CAPS capsule Take  1 capsule (0.4 mg total) by mouth daily after supper. Patient taking differently: Take 0.4 mg by mouth daily after breakfast. 10/16/23  Yes Tobie Yetta HERO, MD  tiZANidine  (ZANAFLEX ) 4 MG tablet Take 1 tablet (4 mg total) by mouth 2 (two) times daily as needed for muscle spasms. 08/05/23  Yes Angiulli, Toribio PARAS, PA-C  traZODone  (DESYREL ) 50 MG tablet Take 1 tablet (50 mg total) by mouth at bedtime as needed for sleep. 10/16/23  Yes Tobie Yetta HERO, MD    Social History   Socioeconomic History   Marital status: Married    Spouse name: Not on file   Number of children: 1   Years of education: Not on file   Highest education level: Not on file  Occupational History   Not on file  Tobacco Use   Smoking status: Never   Smokeless tobacco: Never  Vaping Use   Vaping status: Never Used  Substance and Sexual Activity   Alcohol  use: Never   Drug use: Never   Sexual activity: Not Currently  Other Topics Concern   Not on file  Social History Narrative   Not on file   Social Drivers of Health   Financial Resource Strain: Not on file  Food Insecurity: No Food Insecurity (01/15/2024)   Hunger Vital Sign    Worried About Running Out of Food in the Last Year: Never true    Ran Out of Food in the Last Year: Never true  Transportation Needs: No Transportation Needs (01/15/2024)   PRAPARE - Administrator, Civil Service (Medical): No    Lack of Transportation (Non-Medical): No  Physical Activity: Not on file  Stress: Not on file  Social Connections: Unknown (01/15/2024)   Social Connection and Isolation Panel    Frequency of Communication with Friends and Family: Twice a week    Frequency of Social Gatherings with Friends and Family: Patient unable to answer    Attends Religious Services: 1 to 4 times per year    Active Member of [employer] or Organizations: No    Attends Banker Meetings: Never    Marital Status: Married  Catering manager Violence: Not At Risk (01/15/2024)    Humiliation, Afraid, Rape, and Kick questionnaire    Fear of Current or Ex-Partner: No    Emotionally Abused: No    Physically Abused: No    Sexually Abused: No     Family History  Problem Relation Age of Onset   Dementia Mother     ROS: No complaints     Physical Examination  Vitals:   01/19/24 0948 01/19/24 1027  BP: (!) 90/59 129/71  Pulse: 83 87  Resp: 16 20  Temp:    SpO2: 100% 95%   Body mass index is 34.06 kg/m.  Awake alert and oriented Nonlabored respirations Right chest dressing clean dry intact without evidence of infection.  CBC    Component Value Date/Time   WBC 16.3 (H) 01/19/2024 0426   RBC 2.71 (L) 01/19/2024 0426   HGB 7.6 (L) 01/19/2024 0426   HGB 13.2 06/25/2021 1539   HCT 24.9 (L) 01/19/2024 0426   HCT 41.2 06/25/2021 1539   PLT 180 01/19/2024 0426   PLT 367 06/25/2021 1539  MCV 91.9 01/19/2024 0426   MCV 84 06/25/2021 1539   MCH 28.0 01/19/2024 0426   MCHC 30.5 01/19/2024 0426   RDW 16.4 (H) 01/19/2024 0426   RDW 12.6 06/25/2021 1539   LYMPHSABS 1.5 01/19/2024 0426   LYMPHSABS 2.4 06/25/2021 1539   MONOABS 1.8 (H) 01/19/2024 0426   EOSABS 0.5 01/19/2024 0426   EOSABS 0.2 06/25/2021 1539   BASOSABS 0.1 01/19/2024 0426   BASOSABS 0.1 06/25/2021 1539    BMET    Component Value Date/Time   NA 135 01/19/2024 0426   NA 139 06/25/2021 1539   K 5.1 01/19/2024 0426   CL 97 (L) 01/19/2024 0426   CO2 22 01/19/2024 0426   GLUCOSE 85 01/19/2024 0426   BUN 62 (H) 01/19/2024 0426   BUN 22 06/25/2021 1539   CREATININE 6.40 (H) 01/19/2024 0426   CALCIUM  8.5 (L) 01/19/2024 0426   GFRNONAA 9 (L) 01/19/2024 0426    COAGS: Lab Results  Component Value Date   INR 1.3 (H) 01/14/2024   INR 1.2 10/02/2023   INR 1.1 06/03/2023      ASSESSMENT/PLAN: This is a 73 y.o. male status post tunneled dialysis catheter removal with concern for tricuspid valve endocarditis.  Patient needs dialysis access prior to HD tomorrow we will plan  tunneled catheter placement in the morning.  He will be n.p.o. past midnight.  Melayah Skorupski C. Sheree, MD [facility] Office: [phone] Pager: [phone]

## 2024-01-19 NOTE — Transfer of Care (Signed)
 Immediate Anesthesia Transfer of Care Note  Patient: Calvin Schultz  Procedure(s) Performed: TRANSESOPHAGEAL ECHOCARDIOGRAM  Patient Location: Cath Lab  Anesthesia Type:MAC  Level of Consciousness: drowsy and responds to stimulation  Airway & Oxygen Therapy: Patient Spontanous Breathing and Patient connected to nasal cannula oxygen  Post-op Assessment: Report given to RN and Post -op Vital signs reviewed and stable  Post vital signs: Reviewed and stable  Last Vitals:  Vitals Value Taken Time  BP 90/59   Temp    Pulse 82 01/19/24 09:47  Resp 11 01/19/24 09:47  SpO2 100 % 01/19/24 09:47  Vitals shown include unfiled device data.  Last Pain:  Vitals:   01/19/24 0800  TempSrc: Oral  PainSc:       Patients Stated Pain Goal: 3 (01/18/24 1428)  Complications: No notable events documented.

## 2024-01-19 NOTE — Progress Notes (Addendum)
 PROGRESS NOTE  Calvin Schultz FMW:[id number] DOB: 10/29/1950 DOA: 01/14/2024 PCP: Jesus Elberta Gainer, FNP  HPI/Recap of past 24 hours: Patient is a 73 yo M w/ pertinent PMH ESRD on HD MWF, T2DM, chronic A-fib, diastolic CHF, HTN, HLD presents to [facility] on 9/11 with AMS. Per patient's wife patient noted to be progressively more altered with poor po intake. In the ED, pt noted to be febrile 100.3 F and hypotensive. Labs showed Wbc 47 and LA 3.6. CXR w/ b/l infiltrate vs. atelectasis. B/l leg wound present. CT head negative for acute abnormality. Despite iv fluids patient remained hypotensive started on levo. Of note, pt follows outpt w/ [facility] for b/l leg venous stasis ulcer for debridement and wound care. Had dvt us  and abi of ble on 9/11 which were normal.  Pccm consulted for icu admission.  While in the ICU, patient was subsequently weaned off pressors on 9/12.  Patient further stabilized and [facility] resumed care on 9/14.    Today, saw patient after TEE, denied any new complaints.    Assessment/Plan: Principal Problem:   Septic shock (HCC) Active Problems:   ESRD on hemodialysis (HCC)   MSSA bacteremia   Septic shock MSSA bacteremia likely 2/2 BLE wound/calciphylaxis Currently afebrile, with resolving leukocytosis BP better controlled, off pressors since 9/12 Lactic acidosis has resolved Blood cultures growing MSSA, repeat BC x 2 no growth till date UA was never collected X-rays of bilateral foot were negative for osteomyelitis ID on board, recommend removal of tunneled HD catheter Vascular surgery consulted, s/p TDC removed on 01/18/2024, plan for replacement on 01/20/2024 TTE with EF of 55%, no regional wall motion abnormality, noted right ventricular volume overload.  No evidence of valvular vegetation noted S/p TEE, noted typical appearance of a shaggy vegetation on the left coronary cusp of the aortic valve Continue midodrine  Continue cefazolin , ID to determine  duration of antibiotics Monitor closely   Acute metabolic encephalopathy Resolved Likely from septic shock CT negative for acute abnormality Restarted gabapentin , norco/vicodin, zanaflex , and trazodone  Voltaren  gel as needed for shoulders pain   ESRD on HD MWF BLE calciphylaxis Consulted nephrology Daily renal panel  Anemia of ESRD Hb somewhat around baseline Anemia panel showed iron  52, ferritin 1284, sats 27 Daily CBC  Diastolic CHF HTN HLD Volume management by HD as well as Lasix  Continue Lasix    T2DM A1c 5.6 SSI, Accu-Cheks, hypoglycemia protocol   Chronic A-fib  Rate controlled, not on anticoagulation due to previous acute blood loss from hematuria Not on rate controlling medication  Obesity class I Lifestyle modification advised     Pressure Injury 10/01/23 Heel Left (Active)  10/01/23 2045  Location: Heel  Location Orientation: Left  Staging:   Wound Description (Comments):   DO NOT USE:  Present on Admission: Yes  Dressing Type Foam - Lift dressing to assess site every shift 01/17/24 1945    Deep tissue pressure injury right heel, present on admission Pressure injury left heel, present on admission Pressure injury sacrum stage 2, present on admission  Estimated body mass index is 34.06 kg/m as calculated from the following:   Height as of this encounter: 5' 8 (1.727 m).   Weight as of this encounter: 101.6 kg.     Code Status: Full  Family Communication: None at bedside  Disposition Plan: Status is: Inpatient Remains inpatient appropriate because: Level of care      Consultants: PCCM ID Vascular surgery Cardiology  Procedures: [facility] removal on 01/18/24  Antimicrobials: Cefazolin   DVT prophylaxis: Heparin  Olar   Objective: Vitals:   01/19/24 0800 01/19/24 0822 01/19/24 0948 01/19/24 1027  BP: (!) 110/55 109/61 (!) 90/59 129/71  Pulse: 60 60 83 87  Resp:  (!) 21 16 20   Temp: 98.2 F (36.8 C) 98.2 F (36.8 C)    TempSrc:  Oral     SpO2: 98% 96% 100% 95%  Weight:      Height:        Intake/Output Summary (Last 24 hours) at 01/19/2024 1442 Last data filed at 01/19/2024 9060 Gross per 24 hour  Intake 440 ml  Output 0 ml  Net 440 ml   Filed Weights   01/15/24 2205 01/16/24 0410 01/17/24 0326  Weight: 102.5 kg 101 kg 101.6 kg       Exam: General: NAD  Cardiovascular: S1, S2 present Respiratory: CTAB Abdomen: Soft, nontender, nondistended, bowel sounds present Musculoskeletal: No bilateral pedal edema noted, noted BLE dressing C/D/I Skin: Noted BLE calciphylaxis Psychiatry: Normal mood     Data Reviewed: CBC: Recent Labs  Lab 01/14/24 2018 01/14/24 2031 01/15/24 0858 01/16/24 0303 01/17/24 0220 01/18/24 0454 01/19/24 0426  WBC 47.4*  --  40.5* 24.7* 14.1* 15.0* 16.3*  NEUTROABS 44.6*  --   --   --  11.5* 11.6* 12.2*  HGB 8.1*   < > 7.9* 8.9* 7.2* 7.8* 7.6*  HCT 26.1*   < > 25.2* 28.9* 23.4* 26.3* 24.9*  MCV 92.6  --  92.6 92.0 92.1 93.3 91.9  PLT 229  --  181 174 170 169 180   < > = values in this interval not displayed.   Basic Metabolic Panel: Recent Labs  Lab 01/15/24 0659 01/16/24 0303 01/17/24 0220 01/18/24 0454 01/19/24 0426  NA 132* 134* 134* 137 135  K 5.5* 4.2 4.4 4.9 5.1  CL 94* 96* 96* 96* 97*  CO2 16* 23 22 24 22   GLUCOSE 114* 91 90 80 85  BUN 31* 26* 41* 53* 62*  CREATININE 4.38* 3.55* 4.56* 5.50* 6.40*  CALCIUM  8.6* 8.6* 8.3* 8.6* 8.5*  MG 1.7  --   --   --   --   PHOS  --   --   --  6.5* 7.3*   GFR: Estimated Creatinine Clearance: 12.4 mL/min (A) (by C-G formula based on SCr of 6.4 mg/dL (H)). Liver Function Tests: Recent Labs  Lab 01/14/24 2018 01/18/24 0454 01/19/24 0426  AST 20  --   --   ALT 11  --   --   ALKPHOS 105  --   --   BILITOT 1.7*  --   --   PROT 6.4*  --   --   ALBUMIN  2.4* 2.1* 2.1*   No results for input(s): LIPASE, AMYLASE in the last 168 hours. No results for input(s): AMMONIA in the last 168 hours. Coagulation  Profile: Recent Labs  Lab 01/14/24 2018  INR 1.3*   Cardiac Enzymes: No results for input(s): CKTOTAL, CKMB, CKMBINDEX, TROPONINI in the last 168 hours. BNP (last 3 results) No results for input(s): PROBNP in the last 8760 hours. HbA1C: No results for input(s): HGBA1C in the last 72 hours.  CBG: Recent Labs  Lab 01/18/24 1945 01/19/24 0013 01/19/24 0436 01/19/24 0902 01/19/24 1125  GLUCAP 180* 100* 90 90 80   Lipid Profile: No results for input(s): CHOL, HDL, LDLCALC, TRIG, CHOLHDL, LDLDIRECT in the last 72 hours. Thyroid Function Tests: No results for input(s): TSH, T4TOTAL, FREET4, T3FREE, THYROIDAB in the last 72 hours. Anemia Panel:  Recent Labs    01/18/24 0454  VITAMINB12 1,000*  FOLATE 12.9  FERRITIN 1,284*  TIBC 192*  IRON  52   Urine analysis:    Component Value Date/Time   COLORURINE RED (A) 08/04/2023 1434   APPEARANCEUR TURBID (A) 08/04/2023 1434   LABSPEC  08/04/2023 1434    TEST NOT REPORTED DUE TO COLOR INTERFERENCE OF URINE PIGMENT   PHURINE  08/04/2023 1434    TEST NOT REPORTED DUE TO COLOR INTERFERENCE OF URINE PIGMENT   GLUCOSEU (A) 08/04/2023 1434    TEST NOT REPORTED DUE TO COLOR INTERFERENCE OF URINE PIGMENT   HGBUR (A) 08/04/2023 1434    TEST NOT REPORTED DUE TO COLOR INTERFERENCE OF URINE PIGMENT   BILIRUBINUR (A) 08/04/2023 1434    TEST NOT REPORTED DUE TO COLOR INTERFERENCE OF URINE PIGMENT   KETONESUR (A) 08/04/2023 1434    TEST NOT REPORTED DUE TO COLOR INTERFERENCE OF URINE PIGMENT   PROTEINUR (A) 08/04/2023 1434    TEST NOT REPORTED DUE TO COLOR INTERFERENCE OF URINE PIGMENT   NITRITE (A) 08/04/2023 1434    TEST NOT REPORTED DUE TO COLOR INTERFERENCE OF URINE PIGMENT   LEUKOCYTESUR (A) 08/04/2023 1434    TEST NOT REPORTED DUE TO COLOR INTERFERENCE OF URINE PIGMENT   Sepsis Labs: @LABRCNTIP (procalcitonin:4,lacticidven:4)  ) Recent Results (from the past 240 hours)  Culture, blood (Routine x  2)     Status: Abnormal   Collection Time: 01/14/24  8:20 PM   Specimen: BLOOD RIGHT FOREARM  Result Value Ref Range Status   Specimen Description BLOOD RIGHT FOREARM  Final   Special Requests   Final    BOTTLES DRAWN AEROBIC AND ANAEROBIC Blood Culture results may not be optimal due to an inadequate volume of blood received in culture bottles   Culture  Setup Time   Final    GRAM POSITIVE COCCI IN CLUSTERS IN BOTH AEROBIC AND ANAEROBIC BOTTLES CRITICAL VALUE NOTED.  VALUE IS CONSISTENT WITH PREVIOUSLY REPORTED AND CALLED VALUE.    Culture (A)  Final    STAPHYLOCOCCUS AUREUS SUSCEPTIBILITIES PERFORMED ON PREVIOUS CULTURE WITHIN THE LAST 5 DAYS. Performed at [facility], [address] [place], [place] [address]    Report Status 01/17/2024 FINAL  Final  Culture, blood (Routine x 2)     Status: Abnormal   Collection Time: 01/14/24  8:23 PM   Specimen: BLOOD RIGHT FOREARM  Result Value Ref Range Status   Specimen Description BLOOD RIGHT FOREARM  Final   Special Requests   Final    BOTTLES DRAWN AEROBIC AND ANAEROBIC Blood Culture adequate volume   Culture  Setup Time   Final    GRAM POSITIVE COCCI IN CLUSTERS IN BOTH AEROBIC AND ANAEROBIC BOTTLES CRITICAL RESULT CALLED TO, READ BACK BY AND VERIFIED WITH: MAYA Feliciano ORN on [id number] @0905  by SM Performed at [facility], [address] [place], [place] [address]    Culture STAPHYLOCOCCUS AUREUS (A)  Final   Report Status 01/17/2024 FINAL  Final   Organism ID, Bacteria STAPHYLOCOCCUS AUREUS  Final      Susceptibility   Staphylococcus aureus - MIC*    CIPROFLOXACIN <=0.5 SENSITIVE Sensitive     ERYTHROMYCIN <=0.25 SENSITIVE Sensitive     GENTAMICIN <=0.5 SENSITIVE Sensitive     OXACILLIN 0.5 SENSITIVE Sensitive     TETRACYCLINE <=1 SENSITIVE Sensitive     VANCOMYCIN  <=0.5 SENSITIVE Sensitive     TRIMETH /SULFA  <=10 SENSITIVE Sensitive     CLINDAMYCIN <=0.25 SENSITIVE  Sensitive     RIFAMPIN <=0.5 SENSITIVE Sensitive      Inducible Clindamycin NEGATIVE Sensitive     LINEZOLID 2 SENSITIVE Sensitive     * STAPHYLOCOCCUS AUREUS  Blood Culture ID Panel (Reflexed)     Status: Abnormal   Collection Time: 01/14/24  8:23 PM  Result Value Ref Range Status   Enterococcus faecalis NOT DETECTED NOT DETECTED Final   Enterococcus Faecium NOT DETECTED NOT DETECTED Final   Listeria monocytogenes NOT DETECTED NOT DETECTED Final   Staphylococcus species DETECTED (A) NOT DETECTED Final    Comment: CRITICAL RESULT CALLED TO, READ BACK BY AND VERIFIED WITH: MAYA Feliciano ORN on [id number] @0905  by SM    Staphylococcus aureus (BCID) DETECTED (A) NOT DETECTED Final    Comment: CRITICAL RESULT CALLED TO, READ BACK BY AND VERIFIED WITH: MAYA Feliciano ORN on [id number] @0905  by SM    Staphylococcus epidermidis NOT DETECTED NOT DETECTED Final   Staphylococcus lugdunensis NOT DETECTED NOT DETECTED Final   Streptococcus species NOT DETECTED NOT DETECTED Final   Streptococcus agalactiae NOT DETECTED NOT DETECTED Final   Streptococcus pneumoniae NOT DETECTED NOT DETECTED Final   Streptococcus pyogenes NOT DETECTED NOT DETECTED Final   A.calcoaceticus-baumannii NOT DETECTED NOT DETECTED Final   Bacteroides fragilis NOT DETECTED NOT DETECTED Final   Enterobacterales NOT DETECTED NOT DETECTED Final   Enterobacter cloacae complex NOT DETECTED NOT DETECTED Final   Escherichia coli NOT DETECTED NOT DETECTED Final   Klebsiella aerogenes NOT DETECTED NOT DETECTED Final   Klebsiella oxytoca NOT DETECTED NOT DETECTED Final   Klebsiella pneumoniae NOT DETECTED NOT DETECTED Final   Proteus species NOT DETECTED NOT DETECTED Final   Salmonella species NOT DETECTED NOT DETECTED Final   Serratia marcescens NOT DETECTED NOT DETECTED Final   Haemophilus influenzae NOT DETECTED NOT DETECTED Final   Neisseria meningitidis NOT DETECTED NOT DETECTED Final   Pseudomonas aeruginosa NOT DETECTED NOT DETECTED Final   Stenotrophomonas maltophilia NOT DETECTED NOT  DETECTED Final   Candida albicans NOT DETECTED NOT DETECTED Final   Candida auris NOT DETECTED NOT DETECTED Final   Candida glabrata NOT DETECTED NOT DETECTED Final   Candida krusei NOT DETECTED NOT DETECTED Final   Candida parapsilosis NOT DETECTED NOT DETECTED Final   Candida tropicalis NOT DETECTED NOT DETECTED Final   Cryptococcus neoformans/gattii NOT DETECTED NOT DETECTED Final   Meth resistant mecA/C and MREJ NOT DETECTED NOT DETECTED Final    Comment: Performed at [facility], [address], [place], [place] [address]  Resp panel by RT-PCR (RSV, Flu A&B, Covid) Anterior Nasal Swab     Status: None   Collection Time: 01/14/24  9:30 PM   Specimen: Anterior Nasal Swab  Result Value Ref Range Status   SARS Coronavirus 2 by RT PCR NEGATIVE NEGATIVE Final   Influenza A by PCR NEGATIVE NEGATIVE Final   Influenza B by PCR NEGATIVE NEGATIVE Final    Comment: (NOTE) The Xpert Xpress SARS-CoV-2/FLU/RSV plus assay is intended as an aid in the diagnosis of influenza from Nasopharyngeal swab specimens and should not be used as a sole basis for treatment. Nasal washings and aspirates are unacceptable for Xpert Xpress SARS-CoV-2/FLU/RSV testing.  Fact Sheet for Patients: BloggerCourse.com  Fact Sheet for Healthcare Providers: SeriousBroker.it  This test is not yet approved or cleared by the [place]  FDA and has been authorized for detection and/or diagnosis of SARS-CoV-2 by FDA under an Emergency Use Authorization (EUA). This EUA will remain in effect (meaning this test  can be used) for the duration of the COVID-19 declaration under Section 564(b)(1) of the Act, 21 U.S.C. section 360bbb-3(b)(1), unless the authorization is terminated or revoked.     Resp Syncytial Virus by PCR NEGATIVE NEGATIVE Final    Comment: (NOTE) Fact Sheet for Patients: BloggerCourse.com  Fact Sheet for Healthcare  Providers: SeriousBroker.it  This test is not yet approved or cleared by the [place]  FDA and has been authorized for detection and/or diagnosis of SARS-CoV-2 by FDA under an Emergency Use Authorization (EUA). This EUA will remain in effect (meaning this test can be used) for the duration of the COVID-19 declaration under Section 564(b)(1) of the Act, 21 U.S.C. section 360bbb-3(b)(1), unless the authorization is terminated or revoked.  Performed at [facility], [address] [place], [place] [address]   MRSA Next Gen by PCR, Nasal     Status: None   Collection Time: 01/15/24  1:01 AM   Specimen: Nasal Mucosa; Nasal Swab  Result Value Ref Range Status   MRSA by PCR Next Gen NOT DETECTED NOT DETECTED Final    Comment: (NOTE) The GeneXpert MRSA Assay (FDA approved for NASAL specimens only), is one component of a comprehensive MRSA colonization surveillance program. It is not intended to diagnose MRSA infection nor to guide or monitor treatment for MRSA infections. Test performance is not FDA approved in patients less than 18 years old. Performed at [facility], [address] [place], [place] [address]   Culture, blood (Routine X 2) w Reflex to ID Panel     Status: None (Preliminary result)   Collection Time: 01/15/24  5:33 PM   Specimen: BLOOD RIGHT HAND  Result Value Ref Range Status   Specimen Description BLOOD RIGHT HAND  Final   Special Requests   Final    BOTTLES DRAWN AEROBIC ONLY Blood Culture results may not be optimal due to an inadequate volume of blood received in culture bottles   Culture   Final    NO GROWTH 4 DAYS Performed at [facility], [address], [place], [place] [address]    Report Status PENDING  Incomplete  Culture, blood (Routine X 2) w Reflex to ID Panel     Status: None (Preliminary result)   Collection Time: 01/15/24  5:33 PM   Specimen: BLOOD RIGHT HAND  Result Value Ref Range Status    Specimen Description BLOOD RIGHT HAND  Final   Special Requests   Final    BOTTLES DRAWN AEROBIC ONLY Blood Culture results may not be optimal due to an inadequate volume of blood received in culture bottles   Culture   Final    NO GROWTH 4 DAYS Performed at [facility], [address], [place], [place] [address]    Report Status PENDING  Incomplete      Studies: EP STUDY Result Date: 01/19/2024 See surgical note for result.    Scheduled Meds:  Chlorhexidine  Gluconate Cloth  6 each Topical Q0600   darbepoetin (ARANESP ) injection - DIALYSIS  150 mcg Subcutaneous Q Mon-1800   furosemide   80 mg Intravenous BID   gabapentin   100 mg Oral QHS   heparin   5,000 Units Subcutaneous Q8H   insulin  aspart  0-9 Units Subcutaneous Q4H   midodrine   10 mg Oral Q8H   sevelamer  carbonate  800 mg Oral TID WC   silver  sulfADIAZINE    Topical Daily   sodium chloride  flush  10-40 mL Intracatheter Q12H    Continuous  Infusions:   ceFAZolin  (ANCEF ) IV 1 g (01/19/24 1149)   sodium thiosulfate  25 g in sodium chloride  0.9 % 200 mL Infusion for Calciphylaxis       LOS: 4 days     Lebron JINNY Cage, MD [facility]  If 7PM-7AM, please contact night-coverage [email or web address] 01/19/2024, 2:42 PM

## 2024-01-19 NOTE — Progress Notes (Signed)
 Pt OTF for am line care

## 2024-01-19 NOTE — Progress Notes (Signed)
 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 TDC is out (removed 9/15).Send secure message to ID who reports that it is ok to have TDC placed tomorrow -> will reach out to vascular to verify.  Objective Vitals:   01/19/24 0800 01/19/24 0822 01/19/24 0948 01/19/24 1027  BP: (!) 110/55 109/61 (!) 90/59 129/71  Pulse: 60 60 83 87  Resp:  (!) 21 16 20   Temp: 98.2 F (36.8 C) 98.2 F (36.8 C)    TempSrc: Oral     SpO2: 98% 96% 100% 95%  Weight:      Height:       Physical Exam General: Well appearing man, NAD. [place] in place Heart: RRR; no murmur Lungs: Bibasilar rales present Abdomen: distended, soft Extremities: trace BLE edema (wounds wrapped) - pics c/w calphylaxis Dialysis Access: none  Additional Objective Labs: Basic Metabolic Panel: Recent Labs  Lab 01/17/24 0220 01/18/24 0454 01/19/24 0426  NA 134* 137 135  K 4.4 4.9 5.1  CL 96* 96* 97*  CO2 22 24 22   GLUCOSE 90 80 85  BUN 41* 53* 62*  CREATININE 4.56* 5.50* 6.40*  CALCIUM  8.3* 8.6* 8.5*  PHOS  --  6.5* 7.3*   Liver Function Tests: Recent Labs  Lab 01/14/24 2018 01/18/24 0454 01/19/24 0426  AST 20  --   --   ALT 11  --   --   ALKPHOS 105  --   --   BILITOT 1.7*  --   --   PROT 6.4*  --   --   ALBUMIN  2.4* 2.1* 2.1*   CBC: Recent Labs  Lab 01/15/24 0858 01/16/24 0303 01/17/24 0220 01/18/24 0454 01/19/24 0426  WBC 40.5* 24.7* 14.1* 15.0* 16.3*  NEUTROABS  --   --  11.5* 11.6* 12.2*  HGB 7.9* 8.9* 7.2* 7.8* 7.6*  HCT 25.2* 28.9* 23.4* 26.3* 24.9*  MCV 92.6 92.0 92.1 93.3 91.9  PLT 181 174 170 169 180   Blood Culture    Component Value Date/Time   SDES BLOOD RIGHT HAND 01/15/2024 1733   SDES BLOOD RIGHT HAND 01/15/2024 1733   SPECREQUEST  01/15/2024 1733    BOTTLES DRAWN AEROBIC ONLY Blood Culture results may not be optimal due to an inadequate volume of blood received in culture bottles   SPECREQUEST   01/15/2024 1733    BOTTLES DRAWN AEROBIC ONLY Blood Culture results may not be optimal due to an inadequate volume of blood received in culture bottles   CULT  01/15/2024 1733    NO GROWTH 4 DAYS Performed at [facility], [address] [place], [place] [address]    CULT  01/15/2024 1733    NO GROWTH 4 DAYS Performed at [facility], [address], [place], [place] [address]    REPTSTATUS PENDING 01/15/2024 1733   REPTSTATUS PENDING 01/15/2024 1733   Iron  Studies:  Recent Labs    01/18/24 0454  IRON  52  TIBC 192*  FERRITIN 1,284*   Medications:   ceFAZolin  (ANCEF ) IV 1 g (01/19/24 1149)   sodium thiosulfate  25 g in sodium chloride  0.9 % 200 mL Infusion for Calciphylaxis      Chlorhexidine  Gluconate Cloth  6 each Topical Q0600   darbepoetin (ARANESP ) injection - DIALYSIS  150 mcg Subcutaneous Q Mon-1800   furosemide   80 mg Intravenous BID   gabapentin   100 mg Oral QHS   heparin   5,000 Units Subcutaneous Q8H   insulin  aspart  0-9 Units Subcutaneous Q4H   midodrine   10 mg Oral Q8H   sevelamer  carbonate  800 mg Oral TID WC   silver  sulfADIAZINE    Topical Daily   sodium chloride  flush  10-40 mL Intracatheter Q12H    Dialysis Orders MWF - NW 4hr, 400/A2, EDW 97kg, 3K/2.5Ca bath, TDC, no heparin  - Mircera 100mcg IV q 2 weeks (last 9/1) - Venofer  100mg  x 10 - no VDRA   Assessment/Plan: Septic shock/MSSA bacteremia: Blood Cx 9/11 +, repeat Cx 9/12 negative. Source - TDC v. leg wounds. On Cefazolin . TDC removed 9/15. Currently on line holiday. 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 Plan 6-8 weeks abx. BLE wounds/calciphylaxis: VDRA on hold, getting Na Thiosulfate with HD.  ESRD: Usual MWF schedule - last HD 9/12. ID confirms ok for new [facility] 9/17, left message with [facility] office to check with their team. Plan will be for HD tomorrow after gets new line. HypoTN/volume: Chronically low BP, on mido. Still making some urine, s/p IV Lasix . Anemia of ESRD: Hgb 7.6 -  continue Aranesp  q Monday this admit.  IV iron  on hold d/t infection. Secondary HPTH: CorrCa ok, not on VDRA. Phos high - have started sevelamer , will ^ dose.  Nutrition: Alb low, adding supplements. T2DM   Izetta Boehringer, DEVONNA 01/19/2024, 2:15 PM  [employer]

## 2024-01-19 NOTE — Progress Notes (Signed)
  Echocardiogram Echocardiogram Transesophageal has been performed.  Devora Ellouise SAUNDERS 01/19/2024, 9:50 AM

## 2024-01-19 NOTE — CV Procedure (Addendum)
     PROCEDURE NOTE:  Procedure:  Transesophageal echocardiogram with 3D imaging of AV performed Operator:  Wilbert Bihari, MD Indications:  MSSA Bacteremia Complications: None  During this procedure the patient is administered a total of Propofol  160 mg and Lidocaine  80 mg to achieve and maintain moderate conscious sedation.  The patient's heart rate, blood pressure, and oxygen saturation are monitored continuously during the procedure. The period of conscious sedation is 9 minutes, of which I was present face-to-face 100% of this time. Rumalda Favor, CRNA is an independent, trained observer who assisted in the monitoring of the patient's level of consciousness.    Results: Normal LV size and function EF 50-55% Moderately enlarged RV with moderate RV dysfunction Mildly enlarged RA Normal LA and LA appendage with no thrombus Normal TV with mild to moderate TR Normal PV Degenergative mitral valve with mild moderate mitral annular calcification and mildly calcified MV leaflets with mild MR Trileaflet AV with mild AV sclerosis no stenosis.  There is focal calcification of the left coronary cusp that is consistent with calcific degenerative and does have the typical appearance of a shaggy vegetation. Hypermobile interatrial septum with evidence of shunt by colorflow dopper and agitated saline contrast injection within 2 cardiac cycles Mild atherosclerosis of the thoracic and ascending aorta.  The patient tolerated the procedure well and was transferred back to their room in stable condition.  Signed: Wilbert Bihari, MD [facility]

## 2024-01-19 NOTE — Progress Notes (Signed)
 PT Cancellation Note  Patient Details Name: Calvin Schultz MRN: [id number] DOB: 24-Jun-1950   Cancelled Treatment:    Reason Eval/Treat Not Completed: Patient at procedure or test/unavailable;Other (comment). Pt at TEE this AM and now awaiting food as was NPO for procedure. Will check back if time allows, otherwise will follow up tomorrow.   Richerd Lipoma, PT  Acute Rehab Services Secure chat preferred Office [phone]    Richerd LITTIE Lipoma 01/19/2024, 3:11 PM

## 2024-01-19 NOTE — Anesthesia Postprocedure Evaluation (Signed)
 Anesthesia Post Note  Patient: Calvin Schultz  Procedure(s) Performed: TRANSESOPHAGEAL ECHOCARDIOGRAM     Patient location during evaluation: PACU Anesthesia Type: MAC Level of consciousness: awake and alert Pain management: pain level controlled Vital Signs Assessment: post-procedure vital signs reviewed and stable Respiratory status: spontaneous breathing, nonlabored ventilation, respiratory function stable and patient connected to nasal cannula oxygen Cardiovascular status: stable and blood pressure returned to baseline Postop Assessment: no apparent nausea or vomiting Anesthetic complications: no   No notable events documented.  Last Vitals:  Vitals:   01/19/24 0948 01/19/24 1027  BP: (!) 90/59 129/71  Pulse: 83 87  Resp: 16 20  Temp:    SpO2: 100% 95%    Last Pain:  Vitals:   01/19/24 0800  TempSrc: Oral  PainSc:                  [name]

## 2024-01-20 ENCOUNTER — Encounter (HOSPITAL_COMMUNITY)
Admission: EM | Disposition: A | Discharge status: Home Health | Payer: Self-pay | Source: Home / Self Care | Attending: Internal Medicine

## 2024-01-20 ENCOUNTER — Encounter (HOSPITAL_COMMUNITY): Payer: Self-pay | Admitting: Cardiology

## 2024-01-20 DIAGNOSIS — R6521 Severe sepsis with septic shock: Secondary | ICD-10-CM | POA: Diagnosis not present

## 2024-01-20 DIAGNOSIS — A419 Sepsis, unspecified organism: Secondary | ICD-10-CM | POA: Diagnosis not present

## 2024-01-20 DIAGNOSIS — N186 End stage renal disease: Secondary | ICD-10-CM | POA: Diagnosis not present

## 2024-01-20 HISTORY — PX: DIALYSIS/PERMA CATHETER INSERTION: CATH118288

## 2024-01-20 LAB — RENAL FUNCTION PANEL
Albumin: 2 g/dL — ABNORMAL LOW (ref 3.5–5.0)
Anion gap: 16 — ABNORMAL HIGH (ref 5–15)
BUN: 67 mg/dL — ABNORMAL HIGH (ref 8–23)
CO2: 24 mmol/L (ref 22–32)
Calcium: 9 mg/dL (ref 8.9–10.3)
Chloride: 94 mmol/L — ABNORMAL LOW (ref 98–111)
Creatinine, Ser: 6.86 mg/dL — ABNORMAL HIGH (ref 0.61–1.24)
GFR, Estimated: 8 mL/min — ABNORMAL LOW (ref >60)
Glucose, Bld: 90 mg/dL (ref 70–99)
Phosphorus: 8 mg/dL — ABNORMAL HIGH (ref 2.5–4.6)
Potassium: 5.4 mmol/L — ABNORMAL HIGH (ref 3.5–5.1)
Sodium: 134 mmol/L — ABNORMAL LOW (ref 135–145)

## 2024-01-20 LAB — GLUCOSE, CAPILLARY
Glucose-Capillary: 123 mg/dL — ABNORMAL HIGH (ref 70–99)
Glucose-Capillary: 144 mg/dL — ABNORMAL HIGH (ref 70–99)
Glucose-Capillary: 92 mg/dL (ref 70–99)
Glucose-Capillary: 93 mg/dL (ref 70–99)
Glucose-Capillary: 94 mg/dL (ref 70–99)
Glucose-Capillary: 97 mg/dL (ref 70–99)
Glucose-Capillary: 97 mg/dL (ref 70–99)

## 2024-01-20 LAB — CBC WITH DIFFERENTIAL/PLATELET
Abs Immature Granulocytes: 0.21 K/uL — ABNORMAL HIGH (ref 0.00–0.07)
Basophils Absolute: 0.1 K/uL (ref 0.0–0.1)
Basophils Relative: 1 %
Eosinophils Absolute: 0.5 K/uL (ref 0.0–0.5)
Eosinophils Relative: 4 %
HCT: 24.5 % — ABNORMAL LOW (ref 39.0–52.0)
Hemoglobin: 7.5 g/dL — ABNORMAL LOW (ref 13.0–17.0)
Immature Granulocytes: 2 %
Lymphocytes Relative: 11 %
Lymphs Abs: 1.6 K/uL (ref 0.7–4.0)
MCH: 28.2 pg (ref 26.0–34.0)
MCHC: 30.6 g/dL (ref 30.0–36.0)
MCV: 92.1 fL (ref 80.0–100.0)
Monocytes Absolute: 1.7 K/uL — ABNORMAL HIGH (ref 0.1–1.0)
Monocytes Relative: 12 %
Neutro Abs: 10.1 K/uL — ABNORMAL HIGH (ref 1.7–7.7)
Neutrophils Relative %: 70 %
Platelets: 191 K/uL (ref 150–400)
RBC: 2.66 MIL/uL — ABNORMAL LOW (ref 4.22–5.81)
RDW: 16.3 % — ABNORMAL HIGH (ref 11.5–15.5)
WBC: 14.1 K/uL — ABNORMAL HIGH (ref 4.0–10.5)
nRBC: 0 % (ref 0.0–0.2)

## 2024-01-20 LAB — CULTURE, BLOOD (ROUTINE X 2)
Culture: NO GROWTH
Culture: NO GROWTH

## 2024-01-20 SURGERY — DIALYSIS/PERMA CATHETER INSERTION
Anesthesia: LOCAL

## 2024-01-20 MED ORDER — GABAPENTIN 100 MG PO CAPS
100.0000 mg | ORAL_CAPSULE | Freq: Three times a day (TID) | ORAL | Status: DC
  Administered 2024-01-20 – 2024-01-23 (×8): 100 mg via ORAL
  Filled 2024-01-20 (×8): qty 1

## 2024-01-20 MED ORDER — MIDAZOLAM HCL 2 MG/2ML IJ SOLN
INTRAMUSCULAR | Status: AC
  Filled 2024-01-20: qty 2

## 2024-01-20 MED ORDER — OXYCODONE HCL 5 MG PO TABS
5.0000 mg | ORAL_TABLET | ORAL | Status: DC | PRN
  Administered 2024-01-20 – 2024-01-22 (×7): 5 mg via ORAL
  Filled 2024-01-20 (×7): qty 1

## 2024-01-20 MED ORDER — TIZANIDINE HCL 4 MG PO TABS
2.0000 mg | ORAL_TABLET | Freq: Three times a day (TID) | ORAL | Status: DC
Start: 2024-01-20 — End: 2024-01-22
  Administered 2024-01-20 – 2024-01-21 (×4): 2 mg via ORAL
  Filled 2024-01-20 (×4): qty 1

## 2024-01-20 MED ORDER — CEFAZOLIN SODIUM-DEXTROSE 1-4 GM/50ML-% IV SOLN
1.0000 g | Freq: Once | INTRAVENOUS | Status: AC
  Administered 2024-01-20: 1 g via INTRAVENOUS
  Filled 2024-01-20 (×2): qty 50

## 2024-01-20 MED ORDER — HEPARIN SODIUM (PORCINE) 1000 UNIT/ML IJ SOLN
INTRAMUSCULAR | Status: AC
  Filled 2024-01-20: qty 10

## 2024-01-20 MED ORDER — LIDOCAINE HCL (PF) 1 % IJ SOLN
INTRAMUSCULAR | Status: DC | PRN
  Administered 2024-01-20: 15 mL

## 2024-01-20 MED ORDER — FENTANYL CITRATE (PF) 100 MCG/2ML IJ SOLN
INTRAMUSCULAR | Status: AC
  Filled 2024-01-20: qty 2

## 2024-01-20 MED ORDER — LIDOCAINE HCL (PF) 1 % IJ SOLN
INTRAMUSCULAR | Status: AC
  Filled 2024-01-20: qty 30

## 2024-01-20 MED ORDER — CEFAZOLIN IV (FOR PTA / DISCHARGE USE ONLY): 2.0000 g | INTRAVENOUS | Status: AC

## 2024-01-20 MED ORDER — CEFAZOLIN SODIUM-DEXTROSE 2-4 GM/100ML-% IV SOLN: 2.0000 g | INTRAVENOUS | Status: DC

## 2024-01-20 MED ORDER — HEPARIN (PORCINE) IN NACL 1000-0.9 UT/500ML-% IV SOLN
INTRAVENOUS | Status: DC | PRN
  Administered 2024-01-20: 500 mL

## 2024-01-20 MED ORDER — HEPARIN SODIUM (PORCINE) 1000 UNIT/ML IJ SOLN
INTRAMUSCULAR | Status: DC | PRN
  Administered 2024-01-20 (×2): 2600 [IU] via INTRAVENOUS

## 2024-01-20 MED ORDER — PROSOURCE PLUS PO LIQD
30.0000 mL | Freq: Two times a day (BID) | ORAL | Status: DC
  Administered 2024-01-22: 30 mL via ORAL
  Filled 2024-01-20 (×4): qty 30

## 2024-01-20 MED ORDER — MIDAZOLAM HCL 2 MG/2ML IJ SOLN
INTRAMUSCULAR | Status: DC | PRN
  Administered 2024-01-20: .5 mg via INTRAVENOUS

## 2024-01-20 MED ORDER — SEVELAMER CARBONATE 800 MG PO TABS
1600.0000 mg | ORAL_TABLET | Freq: Three times a day (TID) | ORAL | Status: DC
Start: 2024-01-20 — End: 2024-01-24
  Administered 2024-01-20 – 2024-01-23 (×10): 1600 mg via ORAL
  Filled 2024-01-20 (×11): qty 2

## 2024-01-20 SURGICAL SUPPLY — 4 items
KIT CATH CHRNC PALINDROME 14.5 (CATHETERS) IMPLANT
KIT MICROPUNCTURE NIT STIFF (SHEATH) IMPLANT
PACK CARDIAC CATHETERIZATION (CUSTOM PROCEDURE TRAY) IMPLANT
SHEATH PROBE COVER 6X72 (BAG) IMPLANT

## 2024-01-20 NOTE — Progress Notes (Addendum)
 Informational Antimicrobial Plans with Dialysis  Indication: MSSA bacteremia  Regimen: cefazolin  IV 2g post-HD sessions on HD days (usual HD schedule is MWF) End date: 02/01/2024  This is not an official OPAT note and is intended to be only informational that the patient will receive antimicrobial therapy after dialysis sessions.    Thank you for allowing pharmacy to be a part of this patient's care.  Feliciano Close, PharmD PGY2 Infectious Diseases Pharmacy Resident  01/20/2024 3:03 PM

## 2024-01-20 NOTE — Progress Notes (Signed)
 Patient back from dialysis. Patient in too much pain to change leg dressings at this time. Have reached out to MD for more pain meds.

## 2024-01-20 NOTE — Progress Notes (Signed)
   01/20/24 1552  Vitals  Temp 97.8 F (36.6 C)  Pulse Rate 60  Resp (!) 27  BP 110/60  SpO2 99 %  O2 Device Nasal Cannula  Weight 103 kg  Type of Weight Post-Dialysis  Oxygen Therapy  O2 Flow Rate (L/min) 2 L/min  Patient Activity (if Appropriate) In bed  Pulse Oximetry Type Continuous  Oximetry Probe Site Changed No  Post Treatment  Dialyzer Clearance Lightly streaked  Hemodialysis Intake (mL) 200 mL  Liters Processed 72  Fluid Removed (mL) 3000 mL  Tolerated HD Treatment Yes   Received patient in bed to unit.  Alert and oriented.  Informed consent signed and in chart.   TX duration:3.5HRS  Patient tolerated well.  Transported back to the room  Alert, without acute distress.  Hand-off given to patient's nurse.   Access used: [facility] Access issues: NONE  Total UF removed: 3L Medication(s) given: SODIUM THIOSULFATE     [facility]

## 2024-01-20 NOTE — Progress Notes (Signed)
 PT Cancellation Note  Patient Details Name: Calvin Schultz MRN: [id number] DOB: Sep 28, 1950   Cancelled Treatment:    Reason Eval/Treat Not Completed: Patient at procedure or test/unavailable (Pt off floor. Will follow-up for PT treatment as schedule permits.)  Randall SAUNDERS, PT, DPT Acute Rehabilitation Services Office: [phone] Secure Chat Preferred  Calvin Schultz 01/20/2024, 9:25 AM

## 2024-01-20 NOTE — Progress Notes (Signed)
 Unable to complete wound care at this time, patient in cath lab.

## 2024-01-20 NOTE — Progress Notes (Addendum)
 [facility] Progress Note   Subjective:  Seen on HD s/p new R femoral TDC placement today, 3L UFG and tolerating. No CP/dyspnea.  Objective Vitals:   01/20/24 1241 01/20/24 1300 01/20/24 1330 01/20/24 1400  BP: 118/60 127/74 130/63 125/63  Pulse: (!) 59 (!) 59 60 (!) 59  Resp: 11 12 10 13   Temp:      TempSrc:      SpO2: 100% 100% 100% 100%  Weight:      Height:       Physical Exam General: Well appearing man, NAD. [place] in place Heart: RRR; no murmur Lungs: Bibasilar rales present Abdomen: distended, soft Extremities: trace BLE edema (wounds wrapped) - pics c/w calciphylaxis, R arm bandaged as well Dialysis Access: none  Additional Objective Labs: Basic Metabolic Panel: Recent Labs  Lab 01/18/24 0454 01/19/24 0426 01/20/24 0450  NA 137 135 134*  K 4.9 5.1 5.4*  CL 96* 97* 94*  CO2 24 22 24   GLUCOSE 80 85 90  BUN 53* 62* 67*  CREATININE 5.50* 6.40* 6.86*  CALCIUM  8.6* 8.5* 9.0  PHOS 6.5* 7.3* 8.0*   Liver Function Tests: Recent Labs  Lab 01/14/24 2018 01/18/24 0454 01/19/24 0426 01/20/24 0450  AST 20  --   --   --   ALT 11  --   --   --   ALKPHOS 105  --   --   --   BILITOT 1.7*  --   --   --   PROT 6.4*  --   --   --   ALBUMIN  2.4* 2.1* 2.1* 2.0*   CBC: Recent Labs  Lab 01/16/24 0303 01/17/24 0220 01/18/24 0454 01/19/24 0426 01/20/24 0450  WBC 24.7* 14.1* 15.0* 16.3* 14.1*  NEUTROABS  --  11.5* 11.6* 12.2* 10.1*  HGB 8.9* 7.2* 7.8* 7.6* 7.5*  HCT 28.9* 23.4* 26.3* 24.9* 24.5*  MCV 92.0 92.1 93.3 91.9 92.1  PLT 174 170 169 180 191   Iron  Studies:  Recent Labs    01/18/24 0454  IRON  52  TIBC 192*  FERRITIN 1,284*   Studies/Results: PERIPHERAL VASCULAR CATHETERIZATION Result Date: 01/20/2024 Images from the original result were not included.    Patient name: Calvin Schultz     MRN: [id number]        DOB: [birth year]        Sex: male  01/20/2024 Pre-operative Diagnosis: End-stage renal disease requiring dialysis Post-operative  diagnosis:  Same Surgeon:  Fonda FORBES Rim, MD Procedure Performed: 1.  Ultrasound-guided micropuncture access of the right common femoral vein 2.  Right common femoral vein tunneled dialysis catheter placement 44 cm palindrome   Indications: Patient is a 73 year old male with end-stage renal disease requiring dialysis.  He currently has no access after his right sided tunneled dialysis catheter was pulled due to concern for infection.  He presents today for new tunneled dialysis catheter placement.  There is some concern that he has endocarditis, therefore we discussed right leg tunneled dialysis catheter with the tip left in the pararenal IVC.  Findings: 44 cm palindrome catheter with tip inferior to the cavoatrial junction             Procedure:  The patient was identified in the holding area and taken to room 8.  The patient was then placed supine on the table and prepped and draped in the usual sterile fashion.  A time out was called.  Ultrasound was used to evaluate the right common femoral vein.  The vein was  accessed using an ultrasound-guided micropuncture needle after local anesthetic was administered.  Next, a wire was placed using Seldinger technique, followed by serial dilation of the venotomy to the peel-away sheath.  Next, a counterincision was made on the right thigh, the tract was anesthetized and the catheter pulled through the track to the right groin.  It was then placed on the wire and delivered to the inferior vena cava using Seldinger technique the peel-away sheath was removed.  The tip of the catheter was inferior to the cavoatrial junction.  The right groin was irrigated, and closed using 4-0 Monocryl suture with Dermabond at the level of the skin.  The catheter was held in place using 2-0 nylon suture.  Catheter flushed easily. Catheter was heparin  locked.  Caps were applied.      Fonda FORBES Rim MD [facility] [place] [facility] Office: [phone]   ECHO TEE Result Date:  01/19/2024    TRANSESOPHOGEAL ECHO REPORT   Patient Name:   DESHON HSIAO Date of Exam: 01/19/2024 Medical Rec #:  [id number]   Height:       68.0 in Accession #:    [id number]  Weight:       224.0 lb Date of Birth:  08/10/1950  BSA:          2.145 m Patient Age:    73 years    BP:           106/58 mmHg Patient Gender: M           HR:           77 bpm. Exam Location:  Inpatient Procedure: Transesophageal Echo, Cardiac Doppler, Color Doppler and 3D Echo            (Both Spectral and Color Flow Doppler were utilized during            procedure). Indications:     Subacute bacterial endocarditis;  History:         Patient has prior history of Echocardiogram examinations, most                  recent 01/17/2024. Abnormal ECG, Arrythmias:Atrial Fibrillation;                  Signs/Symptoms:Edema and Bacteremia. ESRD.  Sonographer:     Ellouise Mose RDCS Referring Phys:  [id number] WADDELL A PARCELLS Diagnosing Phys: Wilbert Bihari MD PROCEDURE: After discussion of the risks and benefits of a TEE, an informed consent was obtained from the patient. TEE procedure time was 9 minutes. The transesophogeal probe was passed without difficulty through the esophogus of the patient. Imaged were  obtained with the patient in a left lateral decubitus position. Sedation performed by different physician. The patient was monitored while under deep sedation. Anesthestetic sedation was provided intravenously by Anesthesiology: 160mg  of Propofol , 80mg  of Lidocaine . Image quality was excellent. The patient's vital signs; including heart rate, blood pressure, and oxygen saturation; remained stable throughout the procedure. The patient developed no complications during the procedure.  IMPRESSIONS  1. Left ventricular ejection fraction, by estimation, is 50 to 55%. The left ventricle has low normal function. The left ventricle has no regional wall motion abnormalities.  2. Right ventricular systolic function is mildly reduced. The right ventricular size is  moderately enlarged.  3. No left atrial/left atrial appendage thrombus was detected. The LAA emptying velocity was 44 cm/s.  4. The mitral valve is degenerative. Mild mitral valve regurgitation. No evidence of mitral stenosis.  5. 3D performed of the mitral valve and demonstrates No evidence of mitral valve vegetation.  6. Tricuspid valve regurgitation is mild to moderate.  7. There is focal calcification of the left coronary cusp that is consistent with calcific degenerative and does have the typical appearance of a shaggy vegetation. The aortic valve is tricuspid. Aortic valve regurgitation is not visualized. Aortic valve sclerosis/calcification is present, without any evidence of aortic stenosis.  8. There is mild (Grade II) layered plaque involving the ascending aorta and aortic arch.  9. The inferior vena cava is normal in size with greater than 50% respiratory variability, suggesting right atrial pressure of 3 mmHg. 10. Evidence of atrial level shunting detected by color flow Doppler. Agitated saline contrast bubble study was positive with shunting observed within 3-6 cardiac cycles suggestive of interatrial shunt. There is a moderately sized patent foramen ovale with predominantly left to right shunting across the atrial septum. 11. Right atrial size was mildly dilated. Conclusion(s)/Recommendation(s): No evidence of vegetation/infective endocarditis on this transesophageael echocardiogram. FINDINGS  Left Ventricle: Left ventricular ejection fraction, by estimation, is 50 to 55%. The left ventricle has low normal function. The left ventricle has no regional wall motion abnormalities. The left ventricular internal cavity size was normal in size. There is no left ventricular hypertrophy. Right Ventricle: The right ventricular size is moderately enlarged. No increase in right ventricular wall thickness. Right ventricular systolic function is mildly reduced. Left Atrium: Left atrial size was normal in size. No  left atrial/left atrial appendage thrombus was detected. The LAA emptying velocity was 44 cm/s. Right Atrium: Right atrial size was mildly dilated. Pericardium: There is no evidence of pericardial effusion. Mitral Valve: The mitral valve is degenerative in appearance. There is mild calcification of the mitral valve leaflet(s). Mild mitral annular calcification. Mild mitral valve regurgitation. No evidence of mitral valve stenosis. Tricuspid Valve: The tricuspid valve is normal in structure. Tricuspid valve regurgitation is mild to moderate. No evidence of tricuspid stenosis. Aortic Valve: There is focal calcification of the left coronary cusp that is consistent with calcific degenerative and does have the typical appearance of a shaggy vegetation. The aortic valve is tricuspid. Aortic valve regurgitation is not visualized. Aortic valve sclerosis/calcification is present, without any evidence of aortic stenosis. Pulmonic Valve: The pulmonic valve was not well visualized. Pulmonic valve regurgitation is not visualized. No evidence of pulmonic stenosis. Aorta: The aortic root is normal in size and structure. There is mild (Grade II) layered plaque involving the ascending aorta and aortic arch. Venous: The inferior vena cava is normal in size with greater than 50% respiratory variability, suggesting right atrial pressure of 3 mmHg. IAS/Shunts: There is redundancy of the interatrial septum. Evidence of atrial level shunting detected by color flow Doppler. Agitated saline contrast was given intravenously to evaluate for intracardiac shunting. Agitated saline contrast bubble study was  positive with shunting observed within 3-6 cardiac cycles suggestive of interatrial shunt. A moderately sized patent foramen ovale is detected with predominantly left to right shunting across the atrial septum. Additional Comments: 3D was performed not requiring image post processing on an independent workstation and was normal. Spectral  Doppler performed. Wilbert Bihari MD Electronically signed by Wilbert Bihari MD Signature Date/Time: 01/19/2024/4:16:33 PM    Final (Updated)    EP STUDY Result Date: 01/19/2024 See surgical note for result.  Medications:   ceFAZolin  (ANCEF ) IV 1 g (01/20/24 1045)   sodium thiosulfate  25 g in sodium chloride  0.9 % 200 mL Infusion for Calciphylaxis  Chlorhexidine  Gluconate Cloth  6 each Topical Q0600   darbepoetin (ARANESP ) injection - DIALYSIS  150 mcg Subcutaneous Q Mon-1800   furosemide   80 mg Intravenous BID   gabapentin   100 mg Oral QHS   heparin   5,000 Units Subcutaneous Q8H   insulin  aspart  0-9 Units Subcutaneous Q4H   midodrine   10 mg Oral Q8H   sevelamer  carbonate  800 mg Oral TID WC   silver  sulfADIAZINE    Topical Daily   sodium chloride  flush  10-40 mL Intracatheter Q12H   Dialysis Orders MWF - NW 4hr, 400/A2, EDW 97kg, 3K/2.5Ca bath, TDC, no heparin  - Mircera 100mcg IV q 2 weeks (last 9/1) - Venofer  100mg  x 10 - no VDRA   Assessment/Plan: Septic shock/MSSA bacteremia: Blood Cx 9/11 +, repeat Cx 9/12 negative. Source - TDC v. leg wounds. On Cefazolin . TDC removed 9/15. TEE note confusing, but confirmed with ID today - does NOT have endocarditis. OUTPATIENT ABX PLAN will be Cefazolin  2g q HD x 2 weeks from [facility] removal - end date 02/01/24) BLE wounds/calciphylaxis: VDRA on hold, getting Na Thiosulfate with HD.  ESRD: Usual MWF schedule - HD now s/p new R femoral TDC today, 3L UFG. HypoTN/volume: Chronically low BP, on mido. Still making some urine, s/p IV Lasix . Anemia of ESRD: Hgb 7.5 - continue Aranesp  q Monday this admit.  IV iron  on hold d/t infection. Secondary HPTH: CorrCa ok, not on VDRA. Phos high - have started sevelamer , will ^ dose.  Nutrition: Alb low, adding supplements. T2DM   Izetta Boehringer, PA-C 01/20/2024, 2:22 PM  [employer]

## 2024-01-20 NOTE — Progress Notes (Signed)
 [facility] Progress Note Patient: Calvin Schultz FMW:[id number] DOB: Sep 05, 1950  DOA: 01/14/2024 DOS: the patient was seen and examined on 01/20/2024  Brief Hospital Course: Patient is a 73 yo M w/ pertinent PMH ESRD on HD MWF, T2DM, chronic A-fib, diastolic CHF, HTN, HLD presents to [facility] on 9/11 with AMS. Per patient's wife patient noted to be progressively more altered with poor po intake. In the ED, pt noted to be febrile 100.3 F and hypotensive. Labs showed Wbc 47 and LA 3.6. CXR w/ b/l infiltrate vs. atelectasis. B/l leg wound present. CT head negative for acute abnormality. Despite iv fluids patient remained hypotensive started on levo. Of note, pt follows outpt w/ [facility] for b/l leg venous stasis ulcer for debridement and wound care. Had dvt us  and abi of ble on 9/11 which were normal. Pccm consulted for icu admission. While in the ICU, patient was subsequently weaned off pressors on 9/12. Patient further stabilized and [facility] resumed care on 9/14.   Assessment and Plan: Septic shock MSSA bacteremia likely 2/2 BLE wound/calciphylaxis Currently afebrile, with resolving leukocytosis BP better controlled, off pressors since 9/12 Lactic acidosis has resolved Blood cultures growing MSSA, repeat BC x 2 no growth till date UA was never collected X-rays of bilateral foot were negative for osteomyelitis ID on board, recommend removal of tunneled HD catheter Vascular surgery consulted, s/p TDC removed on 01/18/2024, plan for replacement on 01/20/2024 TTE with EF of 55%, no regional wall motion abnormality, noted right ventricular volume overload.  No evidence of valvular vegetation noted S/p TEE, noted typical appearance of a shaggy vegetation on the left coronary cusp of the aortic valve Continue midodrine  Continue cefazolin , ID to determine duration of antibiotics Monitor closely   Acute metabolic encephalopathy Resolved Likely from septic shock CT negative for acute  abnormality Monitor for receiving aggressive pain medication.   ESRD on HD MWF BLE calciphylaxis Consulted nephrology Patient reports no control of pain with his current regimen. Pain regimen adjusted. At risk for further hypotension as well as confusion.  Monitor clinically.   Anemia of ESRD Hb somewhat around baseline Anemia panel showed iron  52, ferritin 1284, sats 27 Daily CBC   Diastolic CHF HTN HLD Volume management by HD as well as Lasix  Continue Lasix    T2DM A1c 5.6 SSI, Accu-Cheks, hypoglycemia protocol   Chronic A-fib  Rate controlled, not on anticoagulation due to previous acute blood loss from hematuria Not on rate controlling medication   Obesity class I Body mass index is 34.53 kg/m.  Lifestyle modification advised   Subjective: No nausea no vomiting.  Denies any acute complaint.  Reports pain is not controlled at all.  Physical Exam: Clear to auscultation for S1-S2 present Bowel sound present Trace edema.  Data Reviewed: I have Reviewed nursing notes, Vitals, and Lab results. Since last encounter, pertinent lab results CBC and BMP   . I have ordered test including CBC and BMP  .    Disposition: Status is: Inpatient Remains inpatient appropriate because: Monitor for improvement in pain control.  heparin  injection 5,000 Units Start: 01/15/24 0600   Family Communication: No one at bedside Level of care: Telemetry Medical   Vitals:   01/20/24 1530 01/20/24 1551 01/20/24 1552 01/20/24 1639  BP: 128/63 103/75 110/60 (!) 111/57  Pulse: 60 64 60 (!) 58  Resp: 10 (!) 26 (!) 27 18  Temp:   97.8 F (36.6 C) 97.8 F (36.6 C)  TempSrc:    Oral  SpO2: 100% 100% 99%  96%  Weight:   103 kg   Height:         Author: Yetta Blanch, MD 01/20/2024 7:08 PM  Please look on [email or web address] to find out who is on call.

## 2024-01-20 NOTE — Progress Notes (Signed)
 Patient back to room from cath lab. VS stable.

## 2024-01-20 NOTE — Hospital Course (Signed)
 Patient is a 73 yo M w/ pertinent PMH ESRD on HD MWF, T2DM, chronic A-fib, diastolic CHF, HTN, HLD presents to [facility] on 9/11 with AMS. Per patient's wife patient noted to be progressively more altered with poor po intake. In the ED, pt noted to be febrile 100.3 F and hypotensive. Labs showed Wbc 47 and LA 3.6. CXR w/ b/l infiltrate vs. atelectasis. B/l leg wound present. CT head negative for acute abnormality. Despite iv fluids patient remained hypotensive started on levo. Of note, pt follows outpt w/ [facility] for b/l leg venous stasis ulcer for debridement and wound care. Had dvt us  and abi of ble on 9/11 which were normal. Pccm consulted for icu admission. While in the ICU, patient was subsequently weaned off pressors on 9/12. Patient further stabilized and [facility] resumed care on 9/14.   Assessment and Plan: Septic shock MSSA bacteremia likely 2/2 BLE wound/calciphylaxis Currently afebrile, with resolving leukocytosis BP better controlled, off pressors since 9/12 Lactic acidosis has resolved Blood cultures growing MSSA, repeat BC x 2 no growth till date UA was never collected X-rays of bilateral foot were negative for osteomyelitis ID on board, recommend removal of tunneled HD catheter Vascular surgery consulted, s/p TDC removed on 01/18/2024, plan for replacement on 01/20/2024 TTE with EF of 55%, no regional wall motion abnormality, noted right ventricular volume overload.  No evidence of valvular vegetation noted S/p TEE, noted typical appearance of a shaggy vegetation on the left coronary cusp of the aortic valve Continue midodrine  Continue cefazolin , ID to determine duration of antibiotics Monitor closely   Acute metabolic encephalopathy Resolved Likely from septic shock CT negative for acute abnormality Monitor for receiving aggressive pain medication.   ESRD on HD MWF BLE calciphylaxis Uncontrolled pain. Consulted nephrology Patient reports no control of pain with his  current regimen. Pain regimen adjusted.  Will continue current pain medication regimen despite patient complain of uncontrolled pain. At risk for further hypotension as well as confusion.  Monitor clinically.   Anemia of ESRD Hb somewhat around baseline Anemia panel showed iron  52, ferritin 1284, sats 27 Daily CBC   Diastolic CHF HTN HLD Volume management by HD as well as Lasix  Continue Lasix    T2DM A1c 5.6 SSI, Accu-Cheks, hypoglycemia protocol   Chronic A-fib  Rate controlled, not on anticoagulation due to previous acute blood loss from hematuria Not on rate controlling medication   Obesity class I Body mass index is 34.53 kg/m.  Lifestyle modification advised

## 2024-01-20 NOTE — Op Note (Signed)
    Patient name: Calvin Schultz MRN: [id number] DOB: 1951/02/18 Sex: male  01/20/2024 Pre-operative Diagnosis: End-stage renal disease requiring dialysis Post-operative diagnosis:  Same Surgeon:  Fonda FORBES Rim, MD Procedure Performed: 1.  Ultrasound-guided micropuncture access of the right common femoral vein 2.  Right common femoral vein tunneled dialysis catheter placement 44 cm palindrome   Indications: Patient is a 73 year old male with end-stage renal disease requiring dialysis.  He currently has no access after his right sided tunneled dialysis catheter was pulled due to concern for infection.  He presents today for new tunneled dialysis catheter placement.  There is some concern that he has endocarditis, therefore we discussed right leg tunneled dialysis catheter with the tip left in the pararenal IVC.  Findings: 44 cm palindrome catheter with tip inferior to the cavoatrial junction   Procedure:  The patient was identified in the holding area and taken to room 8.  The patient was then placed supine on the table and prepped and draped in the usual sterile fashion.  A time out was called.  Ultrasound was used to evaluate the right common femoral vein.  The vein was accessed using an ultrasound-guided micropuncture needle after local anesthetic was administered.  Next, a wire was placed using Seldinger technique, followed by serial dilation of the venotomy to the peel-away sheath.  Next, a counterincision was made on the right thigh, the tract was anesthetized and the catheter pulled through the track to the right groin.  It was then placed on the wire and delivered to the inferior vena cava using Seldinger technique the peel-away sheath was removed.  The tip of the catheter was inferior to the cavoatrial junction.  The right groin was irrigated, and closed using 4-0 Monocryl suture with Dermabond at the level of the skin.  The catheter was held in place using 2-0 nylon suture.  Catheter flushed  easily. Catheter was heparin  locked.  Caps were applied.       Fonda FORBES Rim MD [facility] [place] Office: [phone]

## 2024-01-20 NOTE — Progress Notes (Signed)
 PT Cancellation Note  Patient Details Name: Calvin Schultz MRN: [id number] DOB: 1951-03-17   Cancelled Treatment:    Reason Eval/Treat Not Completed: Patient at procedure or test/unavailable (Pt off floor for HD. Will follow-up for PT treatment as schedule permits.)  Randall SAUNDERS, PT, DPT Acute Rehabilitation Services Office: [phone] Secure Chat Preferred  Calvin Schultz 01/20/2024, 12:32 PM

## 2024-01-20 NOTE — Progress Notes (Signed)
 Unable to complete wound care at this time, patient in dialysis.

## 2024-01-21 DIAGNOSIS — L97929 Non-pressure chronic ulcer of unspecified part of left lower leg with unspecified severity: Secondary | ICD-10-CM | POA: Diagnosis not present

## 2024-01-21 DIAGNOSIS — B9561 Methicillin susceptible Staphylococcus aureus infection as the cause of diseases classified elsewhere: Secondary | ICD-10-CM | POA: Diagnosis not present

## 2024-01-21 DIAGNOSIS — R7881 Bacteremia: Secondary | ICD-10-CM | POA: Diagnosis not present

## 2024-01-21 DIAGNOSIS — A419 Sepsis, unspecified organism: Secondary | ICD-10-CM | POA: Diagnosis not present

## 2024-01-21 DIAGNOSIS — R6521 Severe sepsis with septic shock: Secondary | ICD-10-CM | POA: Diagnosis not present

## 2024-01-21 DIAGNOSIS — N186 End stage renal disease: Secondary | ICD-10-CM | POA: Diagnosis not present

## 2024-01-21 DIAGNOSIS — Z48812 Encounter for surgical aftercare following surgery on the circulatory system: Secondary | ICD-10-CM

## 2024-01-21 LAB — GLUCOSE, CAPILLARY
Glucose-Capillary: 107 mg/dL — ABNORMAL HIGH (ref 70–99)
Glucose-Capillary: 109 mg/dL — ABNORMAL HIGH (ref 70–99)
Glucose-Capillary: 109 mg/dL — ABNORMAL HIGH (ref 70–99)
Glucose-Capillary: 115 mg/dL — ABNORMAL HIGH (ref 70–99)
Glucose-Capillary: 118 mg/dL — ABNORMAL HIGH (ref 70–99)
Glucose-Capillary: 84 mg/dL (ref 70–99)
Glucose-Capillary: 85 mg/dL (ref 70–99)

## 2024-01-21 LAB — RENAL FUNCTION PANEL
Albumin: 2 g/dL — ABNORMAL LOW (ref 3.5–5.0)
Anion gap: 20 — ABNORMAL HIGH (ref 5–15)
BUN: 40 mg/dL — ABNORMAL HIGH (ref 8–23)
CO2: 25 mmol/L (ref 22–32)
Calcium: 9 mg/dL (ref 8.9–10.3)
Chloride: 93 mmol/L — ABNORMAL LOW (ref 98–111)
Creatinine, Ser: 4.57 mg/dL — ABNORMAL HIGH (ref 0.61–1.24)
GFR, Estimated: 13 mL/min — ABNORMAL LOW (ref >60)
Glucose, Bld: 88 mg/dL (ref 70–99)
Phosphorus: 5.4 mg/dL — ABNORMAL HIGH (ref 2.5–4.6)
Potassium: 4.5 mmol/L (ref 3.5–5.1)
Sodium: 138 mmol/L (ref 135–145)

## 2024-01-21 LAB — CBC
HCT: 24.6 % — ABNORMAL LOW (ref 39.0–52.0)
Hemoglobin: 7.7 g/dL — ABNORMAL LOW (ref 13.0–17.0)
MCH: 28.3 pg (ref 26.0–34.0)
MCHC: 31.3 g/dL (ref 30.0–36.0)
MCV: 90.4 fL (ref 80.0–100.0)
Platelets: 225 K/uL (ref 150–400)
RBC: 2.72 MIL/uL — ABNORMAL LOW (ref 4.22–5.81)
RDW: 16.2 % — ABNORMAL HIGH (ref 11.5–15.5)
WBC: 15 K/uL — ABNORMAL HIGH (ref 4.0–10.5)
nRBC: 0 % (ref 0.0–0.2)

## 2024-01-21 LAB — MAGNESIUM: Magnesium: 1.9 mg/dL (ref 1.7–2.4)

## 2024-01-21 LAB — ECHO TEE

## 2024-01-21 NOTE — Progress Notes (Signed)
 [facility]  Date of Admission:  01/14/2024   Total days of inpatient antibiotics ***  Principal Problem:   Septic shock (HCC) Active Problems:   ESRD on hemodialysis (HCC)   MSSA bacteremia          Assessment: 73 year old male with pertinent history of ESRD on HD via right IJ, diabetes type 2, chronic A-fib, diastolic heart failure, hyperal anemia admitted for septic shock found to have #MSSA bacteremia #Lower extremity venous ulcers #ESRD on HD via right IJ - Patient presented altered mental status WBC 47.4 K.  On arrival temperature 100.3.  Blood cultures grew 2 out of 2 MSSA - Continue cefazolin .  HD line out on 9/15.  Repeat blood cultures cleared on 9/12.  New HD femoral catheter placed on 9/17.  Repeat blood cultures after line placement on 8/17. - Discussed with cardiology no vegetation seen - CT head negative, patient bit altered yesterday.  More alert this a.m. - TTE without appreciation.  Discussed with cardiology, TEE without vegetation as well.  Discussed with cardiology and plan to correct note as a status does have the typical appearance of a shaggy vegetation.  Cardiology confirmed no vegetation present. Recommendations:  -Continue cefazolin  for 2 weeks and line removal EOT 9/28.  Cefazolin  with HD - ID follow-up on 10/16 blood cultures off antibiotic - Standard precautions - Communicated plan to primary    Evaluation of this patient requires complex antimicrobial therapy evaluation and counseling + isolation needs for disease transmission risk assessment and mitigation    Microbiology:   Antibiotics: Vancomycin  and pip-tazo 9/11 Cefazolin  9/12-   Cultures: Blood 9/11 2/2 MSSA 9/12 no growth 9/17 pending  SUBJECTIVE: Resting in bed. More alert today.  Interval: Afebrile overnight.  WBC 15K.  Review of Systems: Review of Systems  All other systems reviewed and are negative.    Scheduled Meds:  (feeding  supplement) PROSource Plus  30 mL Oral BID BM   Chlorhexidine  Gluconate Cloth  6 each Topical Q0600   darbepoetin (ARANESP ) injection - DIALYSIS  150 mcg Subcutaneous Q Mon-1800   gabapentin   100 mg Oral TID   heparin   5,000 Units Subcutaneous Q8H   insulin  aspart  0-9 Units Subcutaneous Q4H   midodrine   10 mg Oral Q8H   sevelamer  carbonate  1,600 mg Oral TID WC   silver  sulfADIAZINE    Topical Daily   sodium chloride  flush  10-40 mL Intracatheter Q12H   tiZANidine   2 mg Oral TID   Continuous Infusions:  [START ON 01/22/2024]  ceFAZolin  (ANCEF ) IV     sodium thiosulfate  25 g in sodium chloride  0.9 % 200 mL Infusion for Calciphylaxis 25 g (01/20/24 1438)   PRN Meds:.acetaminophen , diclofenac  Sodium, docusate sodium , heparin  sodium (porcine), mouth rinse, oxyCODONE , polyethylene glycol, sodium chloride  flush, traZODone  No Known Allergies  OBJECTIVE: Vitals:   01/20/24 2044 01/21/24 0505 01/21/24 0826 01/21/24 1305  BP: (!) 117/58 (!) 105/56 (!) 116/58 (!) 117/56  Pulse: 60 (!) 58 60   Resp: 18 16 16    Temp: 97.8 F (36.6 C) 97.8 F (36.6 C) 97.7 F (36.5 C)   TempSrc:  Oral Oral   SpO2: 99% 100% 99%   Weight:      Height:       Body mass index is 34.53 kg/m.  Physical Exam Constitutional:      General: He is not in acute distress.    Appearance: He is normal weight. He is  not toxic-appearing.  HENT:     Head: Normocephalic and atraumatic.     Right Ear: External ear normal.     Left Ear: External ear normal.     Nose: No congestion or rhinorrhea.     Mouth/Throat:     Mouth: Mucous membranes are moist.     Pharynx: Oropharynx is clear.  Eyes:     Extraocular Movements: Extraocular movements intact.     Conjunctiva/sclera: Conjunctivae normal.     Pupils: Pupils are equal, round, and reactive to light.  Cardiovascular:     Rate and Rhythm: Normal rate and regular rhythm.     Heart sounds: No murmur heard.    No friction rub. No gallop.  Pulmonary:     Effort:  Pulmonary effort is normal.     Breath sounds: Normal breath sounds.  Abdominal:     General: Abdomen is flat. Bowel sounds are normal.     Palpations: Abdomen is soft.  Musculoskeletal:        General: No swelling. Normal range of motion.     Cervical back: Normal range of motion and neck supple.  Skin:    General: Skin is warm and dry.  Neurological:     General: No focal deficit present.     Mental Status: He is oriented to person, place, and time.  Psychiatric:        Mood and Affect: Mood normal.       Lab Results Lab Results  Component Value Date   WBC 15.0 (H) 01/21/2024   HGB 7.7 (L) 01/21/2024   HCT 24.6 (L) 01/21/2024   MCV 90.4 01/21/2024   PLT 225 01/21/2024    Lab Results  Component Value Date   CREATININE 4.57 (H) 01/21/2024   BUN 40 (H) 01/21/2024   NA 138 01/21/2024   K 4.5 01/21/2024   CL 93 (L) 01/21/2024   CO2 25 01/21/2024    Lab Results  Component Value Date   ALT 11 01/14/2024   AST 20 01/14/2024   ALKPHOS 105 01/14/2024   BILITOT 1.7 (H) 01/14/2024        Loney Stank, MD [facility] 01/21/2024, 1:29 PM

## 2024-01-21 NOTE — Progress Notes (Signed)
 [facility] Progress Note   Subjective:  Seen in room - feels ok. Denies dyspnea, but appears to be breathing heavy with O2 in place. S/p HD yesterday - did ok, 3L off.   Objective Vitals:   01/20/24 1639 01/20/24 2044 01/21/24 0505 01/21/24 0826  BP: (!) 111/57 (!) 117/58 (!) 105/56 (!) 116/58  Pulse: (!) 58 60 (!) 58 60  Resp: 18 18 16 16   Temp: 97.8 F (36.6 C) 97.8 F (36.6 C) 97.8 F (36.6 C) 97.7 F (36.5 C)  TempSrc: Oral  Oral Oral  SpO2: 96% 99% 100% 99%  Weight:      Height:       Physical Exam General: Well appearing man, NAD. [place] in place Heart: RRR; no murmur Lungs: Bibasilar rales present Abdomen: distended, soft Extremities: trace BLE edema (wounds wrapped) - pics c/w calciphylaxis, R arm bandaged as well Dialysis Access: R femoral [facility]  Additional Objective Labs: Basic Metabolic Panel: Recent Labs  Lab 01/19/24 0426 01/20/24 0450 01/21/24 0613  NA 135 134* 138  K 5.1 5.4* 4.5  CL 97* 94* 93*  CO2 22 24 25   GLUCOSE 85 90 88  BUN 62* 67* 40*  CREATININE 6.40* 6.86* 4.57*  CALCIUM  8.5* 9.0 9.0  PHOS 7.3* 8.0* 5.4*   Liver Function Tests: Recent Labs  Lab 01/14/24 2018 01/18/24 0454 01/19/24 0426 01/20/24 0450 01/21/24 0613  AST 20  --   --   --   --   ALT 11  --   --   --   --   ALKPHOS 105  --   --   --   --   BILITOT 1.7*  --   --   --   --   PROT 6.4*  --   --   --   --   ALBUMIN  2.4*   < > 2.1* 2.0* 2.0*   < > = values in this interval not displayed.   CBC: Recent Labs  Lab 01/17/24 0220 01/18/24 0454 01/19/24 0426 01/20/24 0450 01/21/24 0613  WBC 14.1* 15.0* 16.3* 14.1* 15.0*  NEUTROABS 11.5* 11.6* 12.2* 10.1*  --   HGB 7.2* 7.8* 7.6* 7.5* 7.7*  HCT 23.4* 26.3* 24.9* 24.5* 24.6*  MCV 92.1 93.3 91.9 92.1 90.4  PLT 170 169 180 191 225   Studies/Results: PERIPHERAL VASCULAR CATHETERIZATION Result Date: 01/20/2024 Images from the original result were not included.    Patient name: Calvin Schultz     MRN:  [id number]        DOB: 08/02/1950        Sex: male  01/20/2024 Pre-operative Diagnosis: End-stage renal disease requiring dialysis Post-operative diagnosis:  Same Surgeon:  Fonda FORBES Rim, MD Procedure Performed: 1.  Ultrasound-guided micropuncture access of the right common femoral vein 2.  Right common femoral vein tunneled dialysis catheter placement 44 cm palindrome   Indications: Patient is a 73 year old male with end-stage renal disease requiring dialysis.  He currently has no access after his right sided tunneled dialysis catheter was pulled due to concern for infection.  He presents today for new tunneled dialysis catheter placement.  There is some concern that he has endocarditis, therefore we discussed right leg tunneled dialysis catheter with the tip left in the pararenal IVC.  Findings: 44 cm palindrome catheter with tip inferior to the cavoatrial junction             Procedure:  The patient was identified in the holding area and taken to room 8.  The patient was then placed supine on the table and prepped and draped in the usual sterile fashion.  A time out was called.  Ultrasound was used to evaluate the right common femoral vein.  The vein was accessed using an ultrasound-guided micropuncture needle after local anesthetic was administered.  Next, a wire was placed using Seldinger technique, followed by serial dilation of the venotomy to the peel-away sheath.  Next, a counterincision was made on the right thigh, the tract was anesthetized and the catheter pulled through the track to the right groin.  It was then placed on the wire and delivered to the inferior vena cava using Seldinger technique the peel-away sheath was removed.  The tip of the catheter was inferior to the cavoatrial junction.  The right groin was irrigated, and closed using 4-0 Monocryl suture with Dermabond at the level of the skin.  The catheter was held in place using 2-0 nylon suture.  Catheter flushed easily. Catheter was heparin   locked.  Caps were applied.      Fonda FORBES Rim MD [facility] Office: [phone]   Medications:  [START ON 01/22/2024]  ceFAZolin  (ANCEF ) IV     sodium thiosulfate  25 g in sodium chloride  0.9 % 200 mL Infusion for Calciphylaxis 25 g (01/20/24 1438)    (feeding supplement) PROSource Plus  30 mL Oral BID BM   Chlorhexidine  Gluconate Cloth  6 each Topical Q0600   darbepoetin (ARANESP ) injection - DIALYSIS  150 mcg Subcutaneous Q Mon-1800   gabapentin   100 mg Oral TID   heparin   5,000 Units Subcutaneous Q8H   insulin  aspart  0-9 Units Subcutaneous Q4H   midodrine   10 mg Oral Q8H   sevelamer  carbonate  1,600 mg Oral TID WC   silver  sulfADIAZINE    Topical Daily   sodium chloride  flush  10-40 mL Intracatheter Q12H   tiZANidine   2 mg Oral TID   Dialysis Orders MWF - NW 4hr, 400/A2, EDW 97kg, 3K/2.5Ca bath, TDC, no heparin  - Mircera 100mcg IV q 2 weeks (last 9/1) - Venofer  100mg  x 10 - no VDRA   Assessment/Plan: Septic shock/MSSA bacteremia: Blood Cx 9/11 +, repeat Cx 9/12 negative. Source - TDC v. leg wounds. On Cefazolin . TDC removed 9/15, new line placed 9/17. WBC high but stable. No endocarditis per ID. OUTPATIENT ABX PLAN will be Cefazolin  2g q HD x 2 weeks from [facility] removal - end date 02/01/24) BLE wounds/calciphylaxis: VDRA on hold, getting Na Thiosulfate with HD.  ESRD: Usual MWF schedule - next HD tomorrow. HypoTN/volume: Chronically low BP, on mido. Still making some urine, s/p IV Lasix  - d/c now. Anemia of ESRD: Hgb 7.7 - continue Aranesp  q Monday this admit.  IV iron  on hold d/t infection. Secondary HPTH: CorrCa ok, not on VDRA. Phos improved with starting sevelamer . Nutrition: Alb low, continue supplements. T2DM     Izetta Boehringer, DEVONNA 01/21/2024, 12:13 PM  [employer]

## 2024-01-21 NOTE — Progress Notes (Signed)
 Physical Therapy Treatment Patient Details Name: Calvin Schultz MRN: [id number] DOB: [birth year] Today's Date: 01/21/2024   History of Present Illness Patient is a 73 y/o male admitted 01/14/24 with AMS and fever and hypotension with concern for septic shock. Pt s/p right femoral TDC placement 9/17 with concern for tricuspid endocarditis from previous catheters. PMH positive for ESRD on HD, DM 2, chronic a-fib, diastolic CHF, HTN, HLD, BPH, CKD III, LE venous statsis ulcers.    PT Comments  Pt greeted supine in bed, pleasant and agreeable to PT session. He stood twice using stedy with mod-maxA x2. Pt demonstrated limited B knee flex and B hip ext. He was unable to improve upright posture in order for stedy seat flaps to be placed underneath him. Returned pt to sitting EOB and mobility specialists retrieved maximove. Pt engaged in seated marches and seated LAQs. He displayed limited AROM d/t weakness and pt. Placed lift pad underneath pt in sitting with minA to aid pt to lean right/left. Transferred pt to recliner chair using maximove. Will continue to follow acutely and advance appropriately.      If plan is discharge home, recommend the following: Two people to help with walking and/or transfers;Assistance with cooking/housework;Assist for transportation;Help with stairs or ramp for entrance;Two people to help with bathing/dressing/bathroom   Can travel by private vehicle        Equipment Recommendations  None recommended by PT    Recommendations for Other Services       Precautions / Restrictions Precautions Precautions: Fall Recall of Precautions/Restrictions: Impaired Restrictions Weight Bearing Restrictions Per Provider Order: No     Mobility  Bed Mobility Overal bed mobility: Needs Assistance Bed Mobility: Supine to Sit     Supine to sit: Min assist, HOB elevated, Used rails, +2 for physical assistance     General bed mobility comments: Pt sat up on L side of bed with  increased time. He brought BLE off EOB. Cues for sequencing. Assist to elevate trunk.    Transfers Overall transfer level: Needs assistance Equipment used: Ambulation equipment used Transfers: Sit to/from Stand, Bed to chair/wheelchair/BSC Sit to Stand: Mod assist, Max assist, +2 physical assistance, Via lift equipment           General transfer comment: Pt stood from slightly raised bed height using stedy. He had decreased B knee flex sitting EOB. Cued pt to increased fwd lean. He utilized momentum to aid in Buyer, retail up. On first attempt, PT & Mobility Specialists utilized bed pad to facilitate pt in coming up and bringing hips fwd. He required modA x2. Pt lacked adequate hip ext in order to get stedy pads underneath him. He quickly fatigued and required seated rest on bed. Pt stood again requiring maxA x2 d/t fatigue. Got maximove in order to transfer pt to recliner chair. Placed lift pad with pt sitting EOB. He leaned R/L with minA to allow proper placement of pad underneath him. Transfer via Lift Equipment: Herby Feather  Ambulation/Gait               General Gait Details: Unable   Optometrist     Tilt Bed    Modified Rankin (Stroke Patients Only)       Balance Overall balance assessment: Needs assistance Sitting-balance support: Bilateral upper extremity supported, Feet supported Sitting balance-Leahy Scale: Fair Sitting balance - Comments: Pt sat EOB with supervision. He engaged in left/right leans in order for  lift pad to be placed with minA.   Standing balance support: Bilateral upper extremity supported, During functional activity, Reliant on assistive device for balance Standing balance-Leahy Scale: Poor Standing balance comment: Pt dependent on stedy and +2 assist. Quickly fatigued in static stance.                            Communication Communication Communication: No apparent difficulties  Cognition  Arousal: Alert Behavior During Therapy: WFL for tasks assessed/performed   PT - Cognitive impairments: No apparent impairments                         Following commands: Intact      Cueing Cueing Techniques: Verbal cues  Exercises General Exercises - Lower Extremity Long Arc Quad: Seated, Both, AROM, 10 reps, Limitations Long Arc Quad Limitations: Pt unable to achieve full ROM, he performed 90-45deg Hip Flexion/Marching: Seated, Both, AROM, 10 reps, Limitations Hip Flexion/Marching Limitations: Pt unable to achieve full ROM, he was able to raise leg slightly off bed ~10deg and demonstrated a posterior lean.    General Comments General comments (skin integrity, edema, etc.): VSS on 2L O2. Pt with slight bleeding from dialysis catheter in R leg. Wound Care team came in during session and check pt's sacrum. RN placed dressing on pt's R leg.      Pertinent Vitals/Pain Pain Assessment Pain Assessment: Faces Faces Pain Scale: Hurts little more Pain Location: BLE Pain Descriptors / Indicators: Discomfort, Aching, Sore Pain Intervention(s): Monitored during session, Limited activity within patient's tolerance, Repositioned    Home Living                          Prior Function            PT Goals (current goals can now be found in the care plan section) Acute Rehab PT Goals Patient Stated Goal: Return Home PT Goal Formulation: With patient Time For Goal Achievement: 01/29/24 Potential to Achieve Goals: Fair Progress towards PT goals: Progressing toward goals    Frequency    Min 2X/week      PT Plan      Co-evaluation              AM-PAC PT 6 Clicks Mobility   Outcome Measure  Help needed turning from your back to your side while in a flat bed without using bedrails?: A Lot Help needed moving from lying on your back to sitting on the side of a flat bed without using bedrails?: Total Help needed moving to and from a bed to a chair  (including a wheelchair)?: Total Help needed standing up from a chair using your arms (e.g., wheelchair or bedside chair)?: Total Help needed to walk in hospital room?: Total Help needed climbing 3-5 steps with a railing? : Total 6 Click Score: 7    End of Session Equipment Utilized During Treatment: Gait belt;Oxygen Activity Tolerance: Patient tolerated treatment well;Patient limited by pain;Patient limited by fatigue Patient left: in chair;with call bell/phone within reach;with chair alarm set Nurse Communication: Mobility status;Need for lift equipment (maximove) PT Visit Diagnosis: Muscle weakness (generalized) (M62.81);Other abnormalities of gait and mobility (R26.89)     Time: 8667-8643 PT Time Calculation (min) (ACUTE ONLY): 24 min  Charges:    $Therapeutic Exercise: 8-22 mins $Therapeutic Activity: 8-22 mins PT General Charges $$ ACUTE PT VISIT: 1 Visit  Randall SAUNDERS, PT, DPT Acute Rehabilitation Services Office: [phone] Secure Chat Preferred  Delon CHRISTELLA Callander 01/21/2024, 2:37 PM

## 2024-01-21 NOTE — Progress Notes (Signed)
  Progress Note    01/21/2024 11:54 AM 1 Day Post-Op  Subjective: No overnight issues  Vitals:   01/21/24 0505 01/21/24 0826  BP: (!) 105/56 (!) 116/58  Pulse: (!) 58 60  Resp: 16 16  Temp: 97.8 F (36.6 C) 97.7 F (36.5 C)  SpO2: 100% 99%    Physical Exam: Awake alert and oriented All 4 limbs have edema Left upper extremity with strong palpable thrill Right femoral TDC in place without hematoma  CBC    Component Value Date/Time   WBC 15.0 (H) 01/21/2024 0613   RBC 2.72 (L) 01/21/2024 0613   HGB 7.7 (L) 01/21/2024 0613   HGB 13.2 06/25/2021 1539   HCT 24.6 (L) 01/21/2024 0613   HCT 41.2 06/25/2021 1539   PLT 225 01/21/2024 0613   PLT 367 06/25/2021 1539   MCV 90.4 01/21/2024 0613   MCV 84 06/25/2021 1539   MCH 28.3 01/21/2024 0613   MCHC 31.3 01/21/2024 0613   RDW 16.2 (H) 01/21/2024 0613   RDW 12.6 06/25/2021 1539   LYMPHSABS 1.6 01/20/2024 0450   LYMPHSABS 2.4 06/25/2021 1539   MONOABS 1.7 (H) 01/20/2024 0450   EOSABS 0.5 01/20/2024 0450   EOSABS 0.2 06/25/2021 1539   BASOSABS 0.1 01/20/2024 0450   BASOSABS 0.1 06/25/2021 1539    BMET    Component Value Date/Time   NA 138 01/21/2024 0613   NA 139 06/25/2021 1539   K 4.5 01/21/2024 0613   CL 93 (L) 01/21/2024 0613   CO2 25 01/21/2024 0613   GLUCOSE 88 01/21/2024 0613   BUN 40 (H) 01/21/2024 0613   BUN 22 06/25/2021 1539   CREATININE 4.57 (H) 01/21/2024 0613   CALCIUM  9.0 01/21/2024 0613   GFRNONAA 13 (L) 01/21/2024 0613    INR    Component Value Date/Time   INR 1.3 (H) 01/14/2024 2018     Intake/Output Summary (Last 24 hours) at 01/21/2024 1154 Last data filed at 01/21/2024 0900 Gross per 24 hour  Intake 960 ml  Output 3300 ml  Net -2340 ml     Assessment/plan:  73 y.o. male is s/p status post right femoral TDC placement with concern for tricuspid endocarditis from previous catheters.  Will ultimately require transposition of left basilic vein AV fistula and I will have him follow-up  in 6 weeks in our office with left upper extremity dialysis access duplex.     Jettson Crable C. Sheree, MD [facility] Office: [phone] Pager: [phone]  01/21/2024 11:54 AM

## 2024-01-21 NOTE — Progress Notes (Signed)
 Occupational Therapy Treatment Patient Details Name: Calvin Schultz MRN: [id number] DOB: 1950-12-31 Today's Date: 01/21/2024   History of present illness Patient is a 73 y/o male admitted 01/14/24 with AMS and fever and hypotension with concern for septic shock. Pt s/p right femoral TDC placement 9/17 with concern for tricuspid endocarditis from previous catheters. PMH positive for ESRD on HD, DM 2, chronic a-fib, diastolic CHF, HTN, HLD, BPH, CKD III, LE venous statsis ulcers.   OT comments  Pt content up in chair. Reports sitting with feet down is more comfortable as it keeps pressure off his calves. Pt participated well in AAROM B shoulders and B UE strengthening exercises with level 2 theraband. Reports his niece helps him with UB exercises at home, primarily stretching. Pt is hopeful to return home in the next couple of days.       If plan is discharge home, recommend the following:  Two people to help with walking and/or transfers;Two people to help with bathing/dressing/bathroom;Assistance with cooking/housework;Assist for transportation;Help with stairs or ramp for entrance   Equipment Recommendations  None recommended by OT    Recommendations for Other Services      Precautions / Restrictions Precautions Precautions: Fall Restrictions Weight Bearing Restrictions Per Provider Order: No       Mobility Bed Mobility               General bed mobility comments: received in chair    Transfers                         Balance                                           ADL either performed or assessed with clinical judgement   ADL Overall ADL's : Needs assistance/impaired     Grooming: Brushing hair;Oral care;Sitting;Set up                                      Extremity/Trunk Assessment              Vision       Perception     Praxis     Communication Communication Communication: No apparent difficulties    Cognition Arousal: Alert Behavior During Therapy: WFL for tasks assessed/performed Cognition: No family to determine baseline, decreased awareness of time                               Following commands: Intact        Cueing   Cueing Techniques: Verbal cues  Exercises Exercises: General Upper Extremity General Exercises - Upper Extremity Shoulder Flexion: AAROM, Both, 10 reps, Seated Shoulder Extension: Strengthening, Both, 10 reps, Seated Shoulder Horizontal ABduction: AAROM, Both, 10 reps, Seated Shoulder Horizontal ADduction: AAROM, Both, 10 reps, Seated Elbow Flexion: Strengthening, 10 reps, Seated, Theraband, Both Theraband Level (Elbow Flexion): Level 2 (Red) Elbow Extension: Strengthening, Both, 10 reps, Seated, Theraband Theraband Level (Elbow Extension): Level 2 (Red) Chair Push Up: Strengthening, Both, 5 reps, Seated    Shoulder Instructions       General Comments VSS on 2L O2. Pt with slight bleeding from dialysis catheter in R leg. Wound Care team came in during session and check  pt's sacrum. RN placed dressing on pt's R leg.    Pertinent Vitals/ Pain       Pain Assessment Pain Assessment: Faces Faces Pain Scale: Hurts little more Pain Location: BLE Pain Descriptors / Indicators: Discomfort, Aching, Sore Pain Intervention(s): Monitored during session, Repositioned  Home Living                                          Prior Functioning/Environment              Frequency  Min 2X/week        Progress Toward Goals  OT Goals(current goals can now be found in the care plan section)  Progress towards OT goals: Progressing toward goals  Acute Rehab OT Goals OT Goal Formulation: With patient Time For Goal Achievement: 02/01/24 Potential to Achieve Goals: Good  Plan      Co-evaluation                 AM-PAC OT 6 Clicks Daily Activity     Outcome Measure   Help from another person eating meals?:  None Help from another person taking care of personal grooming?: A Little Help from another person toileting, which includes using toliet, bedpan, or urinal?: Total Help from another person bathing (including washing, rinsing, drying)?: A Lot Help from another person to put on and taking off regular upper body clothing?: A Little Help from another person to put on and taking off regular lower body clothing?: Total 6 Click Score: 14    End of Session    OT Visit Diagnosis: Unsteadiness on feet (R26.81);Muscle weakness (generalized) (M62.81);Pain   Activity Tolerance Patient tolerated treatment well   Patient Left in chair;with call bell/phone within reach;with chair alarm set   Nurse Communication          Time: 1545-1600 OT Time Calculation (min): 15 min  Charges: OT General Charges $OT Visit: 1 Visit OT Treatments $Therapeutic Exercise: 8-22 mins Mliss HERO, OTR/L Acute Rehabilitation Services Office: [phone]   Kennth Mliss Helling 01/21/2024, 4:21 PM

## 2024-01-21 NOTE — Progress Notes (Signed)
 [facility] Progress Note Patient: Calvin Schultz FMW:[id number] DOB: 1950-06-05  DOA: 01/14/2024 DOS: the patient was seen and examined on 01/21/2024  Brief Hospital Course: Patient is a 73 yo M w/ pertinent PMH ESRD on HD MWF, T2DM, chronic A-fib, diastolic CHF, HTN, HLD presents to [facility] on 9/11 with AMS. Per patient's wife patient noted to be progressively more altered with poor po intake. In the ED, pt noted to be febrile 100.3 F and hypotensive. Labs showed Wbc 47 and LA 3.6. CXR w/ b/l infiltrate vs. atelectasis. B/l leg wound present. CT head negative for acute abnormality. Despite iv fluids patient remained hypotensive started on levo. Of note, pt follows outpt w/ [facility] for b/l leg venous stasis ulcer for debridement and wound care. Had dvt us  and abi of ble on 9/11 which were normal. Pccm consulted for icu admission. While in the ICU, patient was subsequently weaned off pressors on 9/12. Patient further stabilized and [facility] hospital[facility] resumed care on 9/14.   Assessment and Plan: Septic shock MSSA bacteremia likely 2/2 BLE wound/calciphylaxis Currently afebrile, with resolving leukocytosis BP better controlled, off pressors since 9/12 Lactic acidosis has resolved Blood cultures growing MSSA, repeat BC x 2 no growth till date UA was never collected X-rays of bilateral foot were negative for osteomyelitis ID on board, recommend removal of tunneled HD catheter Vascular surgery consulted, s/p TDC removed on 01/18/2024, plan for replacement on 01/20/2024 TTE with EF of 55%, no regional wall motion abnormality, noted right ventricular volume overload.  No evidence of valvular vegetation noted S/p TEE, noted typical appearance of a shaggy vegetation on the left coronary cusp of the aortic valve Continue midodrine  Continue cefazolin , ID to determine duration of antibiotics Monitor closely   Acute metabolic encephalopathy Resolved Likely from septic shock CT negative for acute  abnormality Monitor for receiving aggressive pain medication.   ESRD on HD MWF BLE calciphylaxis Uncontrolled pain. Consulted nephrology Patient reports no control of pain with his current regimen. Pain regimen adjusted.  Will continue current pain medication regimen despite patient complain of uncontrolled pain. At risk for further hypotension as well as confusion.  Monitor clinically.   Anemia of ESRD Hb somewhat around baseline Anemia panel showed iron  52, ferritin 1284, sats 27 Daily CBC   Diastolic CHF HTN HLD Volume management by HD as well as Lasix  Continue Lasix    T2DM A1c 5.6 SSI, Accu-Cheks, hypoglycemia protocol   Chronic A-fib  Rate controlled, not on anticoagulation due to previous acute blood loss from hematuria Not on rate controlling medication   Obesity class I Body mass index is 34.53 kg/m.  Lifestyle modification advised   Subjective: No nausea no vomiting no fever no chills.  Pain is uncontrolled.  Physical Exam: Clear to auscultation. Bowel sound present Trace edema.  Data Reviewed: I have Reviewed nursing notes, Vitals, and Lab results. Since last encounter, pertinent lab results CBC and BMP   . I have ordered test including CBC and BMP  .   Disposition: Status is: Inpatient Remains inpatient appropriate because: Monitor for improvement in pain control  heparin  injection 5,000 Units Start: 01/15/24 0600  Family Communication: No one at bedside Level of care: Telemetry Medical   Vitals:   01/21/24 0505 01/21/24 0826 01/21/24 1305 01/21/24 1453  BP: (!) 105/56 (!) 116/58 (!) 117/56 (!) 110/54  Pulse: (!) 58 60  61  Resp: 16 16  16   Temp: 97.8 F (36.6 C) 97.7 F (36.5 C)  98.2 F (36.8 C)  TempSrc:  Oral Oral  Oral  SpO2: 100% 99%  98%  Weight:      Height:         Author: Yetta Blanch, MD 01/21/2024 7:15 PM  Please look on [email or web address] to find out who is on call.

## 2024-01-21 NOTE — Plan of Care (Signed)
   Problem: Education: Goal: Knowledge of General Education information will improve Description Including pain rating scale, medication(s)/side effects and non-pharmacologic comfort measures Outcome: Progressing   Problem: Health Behavior/Discharge Planning: Goal: Ability to manage health-related needs will improve Outcome: Progressing

## 2024-01-22 DIAGNOSIS — A419 Sepsis, unspecified organism: Secondary | ICD-10-CM | POA: Diagnosis not present

## 2024-01-22 DIAGNOSIS — R6521 Severe sepsis with septic shock: Secondary | ICD-10-CM | POA: Diagnosis not present

## 2024-01-22 LAB — CBC
HCT: 24.8 % — ABNORMAL LOW (ref 39.0–52.0)
Hemoglobin: 7.8 g/dL — ABNORMAL LOW (ref 13.0–17.0)
MCH: 28.6 pg (ref 26.0–34.0)
MCHC: 31.5 g/dL (ref 30.0–36.0)
MCV: 90.8 fL (ref 80.0–100.0)
Platelets: 261 K/uL (ref 150–400)
RBC: 2.73 MIL/uL — ABNORMAL LOW (ref 4.22–5.81)
RDW: 16.3 % — ABNORMAL HIGH (ref 11.5–15.5)
WBC: 16.8 K/uL — ABNORMAL HIGH (ref 4.0–10.5)
nRBC: 0 % (ref 0.0–0.2)

## 2024-01-22 LAB — GLUCOSE, CAPILLARY
Glucose-Capillary: 92 mg/dL (ref 70–99)
Glucose-Capillary: 100 mg/dL — ABNORMAL HIGH (ref 70–99)
Glucose-Capillary: 103 mg/dL — ABNORMAL HIGH (ref 70–99)
Glucose-Capillary: 108 mg/dL — ABNORMAL HIGH (ref 70–99)

## 2024-01-22 LAB — RENAL FUNCTION PANEL
Albumin: 2 g/dL — ABNORMAL LOW (ref 3.5–5.0)
Anion gap: 16 — ABNORMAL HIGH (ref 5–15)
BUN: 46 mg/dL — ABNORMAL HIGH (ref 8–23)
CO2: 23 mmol/L (ref 22–32)
Calcium: 9 mg/dL (ref 8.9–10.3)
Chloride: 94 mmol/L — ABNORMAL LOW (ref 98–111)
Creatinine, Ser: 5.56 mg/dL — ABNORMAL HIGH (ref 0.61–1.24)
GFR, Estimated: 10 mL/min — ABNORMAL LOW (ref >60)
Glucose, Bld: 95 mg/dL (ref 70–99)
Phosphorus: 6.8 mg/dL — ABNORMAL HIGH (ref 2.5–4.6)
Potassium: 4.9 mmol/L (ref 3.5–5.1)
Sodium: 133 mmol/L — ABNORMAL LOW (ref 135–145)

## 2024-01-22 LAB — MAGNESIUM: Magnesium: 2.1 mg/dL (ref 1.7–2.4)

## 2024-01-22 MED ORDER — HEPARIN SODIUM (PORCINE) 1000 UNIT/ML IJ SOLN
INTRAMUSCULAR | Status: AC
  Filled 2024-01-22: qty 6

## 2024-01-22 MED ORDER — OXYCODONE HCL 5 MG PO TABS
5.0000 mg | ORAL_TABLET | ORAL | Status: DC | PRN
  Administered 2024-01-22: 10 mg via ORAL
  Administered 2024-01-22: 5 mg via ORAL
  Administered 2024-01-23: 10 mg via ORAL
  Filled 2024-01-22 (×2): qty 2
  Filled 2024-01-22: qty 1

## 2024-01-22 MED ORDER — POLYSACCHARIDE IRON COMPLEX 150 MG PO CAPS
150.0000 mg | ORAL_CAPSULE | Freq: Every day | ORAL | Status: DC
  Administered 2024-01-22 – 2024-01-23 (×2): 150 mg via ORAL
  Filled 2024-01-22 (×2): qty 1

## 2024-01-22 MED ORDER — TIZANIDINE HCL 4 MG PO TABS
4.0000 mg | ORAL_TABLET | Freq: Three times a day (TID) | ORAL | Status: DC
  Administered 2024-01-22 – 2024-01-23 (×4): 4 mg via ORAL
  Filled 2024-01-22 (×4): qty 1

## 2024-01-22 MED ORDER — NEPRO/CARBSTEADY PO LIQD
237.0000 mL | Freq: Three times a day (TID) | ORAL | Status: DC
Start: 2024-01-22 — End: 2024-01-24

## 2024-01-22 MED ORDER — FUROSEMIDE 40 MG PO TABS: 80.0000 mg | ORAL_TABLET | ORAL | Status: DC

## 2024-01-22 MED ORDER — MIDODRINE HCL 5 MG PO TABS
10.0000 mg | ORAL_TABLET | Freq: Three times a day (TID) | ORAL | Status: DC
  Administered 2024-01-22 – 2024-01-23 (×5): 10 mg via ORAL
  Filled 2024-01-22 (×5): qty 2

## 2024-01-22 MED ORDER — INSULIN ASPART 100 UNIT/ML IJ SOLN: 0.0000 [IU] | Freq: Three times a day (TID) | INTRAMUSCULAR | Status: DC

## 2024-01-22 MED ORDER — CEFAZOLIN SODIUM-DEXTROSE 2-4 GM/100ML-% IV SOLN
2.0000 g | INTRAVENOUS | Status: DC
  Administered 2024-01-22: 2 g via INTRAVENOUS
  Filled 2024-01-22: qty 100

## 2024-01-22 MED ORDER — TAMSULOSIN HCL 0.4 MG PO CAPS
0.4000 mg | ORAL_CAPSULE | Freq: Every day | ORAL | Status: DC
  Administered 2024-01-22 – 2024-01-23 (×2): 0.4 mg via ORAL
  Filled 2024-01-22 (×2): qty 1

## 2024-01-22 NOTE — Progress Notes (Signed)
  [facility] Progress Note   Subjective:  Seen in [facility]. On dialysis via femoral TDC. Remains on supp O2. No new complaints.   Objective Vitals:   01/22/24 0905 01/22/24 0950 01/22/24 1019 01/22/24 1050  BP: (!) 105/55 100/60 98/62 (!) 98/57  Pulse: (!) 55 (!) 55 (!) 58 61  Resp: 11 11 13  (!) 8  Temp:      TempSrc:      SpO2: 100% 100% 100% 100%  Weight:      Height:       Physical Exam General: Well appearing man, NAD. [place] in place Heart: RRR; no murmur Lungs: Bibasilar rales present Abdomen: distended, soft Extremities: trace BLE edema (wounds wrapped) - pics c/w calciphylaxis, R arm bandaged as well Dialysis Access: R femoral TDC  Additional Objective Labs: Basic Metabolic Panel: Recent Labs  Lab 01/20/24 0450 01/21/24 0613 01/22/24 0505  NA 134* 138 133*  K 5.4* 4.5 4.9  CL 94* 93* 94*  CO2 24 25 23   GLUCOSE 90 88 95  BUN 67* 40* 46*  CREATININE 6.86* 4.57* 5.56*  CALCIUM  9.0 9.0 9.0  PHOS 8.0* 5.4* 6.8*   Liver Function Tests: Recent Labs  Lab 01/20/24 0450 01/21/24 0613 01/22/24 0505  ALBUMIN  2.0* 2.0* 2.0*   CBC: Recent Labs  Lab 01/18/24 0454 01/19/24 0426 01/20/24 0450 01/21/24 0613 01/22/24 0505  WBC 15.0* 16.3* 14.1* 15.0* 16.8*  NEUTROABS 11.6* 12.2* 10.1*  --   --   HGB 7.8* 7.6* 7.5* 7.7* 7.8*  HCT 26.3* 24.9* 24.5* 24.6* 24.8*  MCV 93.3 91.9 92.1 90.4 90.8  PLT 169 180 191 225 261   Studies/Results: No results found.  Medications:   ceFAZolin  (ANCEF ) IV     sodium thiosulfate  25 g in sodium chloride  0.9 % 200 mL Infusion for Calciphylaxis 25 g (01/20/24 1438)    (feeding supplement) PROSource Plus  30 mL Oral BID BM   Chlorhexidine  Gluconate Cloth  6 each Topical Q0600   darbepoetin (ARANESP ) injection - DIALYSIS  150 mcg Subcutaneous Q Mon-1800   gabapentin   100 mg Oral TID   heparin   5,000 Units Subcutaneous Q8H   insulin  aspart  0-9 Units Subcutaneous Q4H   midodrine   10 mg Oral Q8H   sevelamer  carbonate   1,600 mg Oral TID WC   silver  sulfADIAZINE    Topical Daily   sodium chloride  flush  10-40 mL Intracatheter Q12H   tiZANidine   2 mg Oral TID   Dialysis Orders MWF - NW 4hr, 400/A2, EDW 97kg, 3K/2.5Ca bath, TDC, no heparin  - Mircera 100mcg IV q 2 weeks (last 9/1) - Venofer  100mg  x 10 - no VDRA   Assessment/Plan: Septic shock/MSSA bacteremia: Blood Cx 9/11 +, repeat Cx 9/12 negative. Source - TDC v. leg wounds. On Cefazolin . TDC removed 9/15, new line placed 9/17. WBC high but stable. No endocarditis per ID. OUTPATIENT ABX PLAN will be Cefazolin  2g q HD x 2 weeks from [facility] removal - end date 02/01/24) BLE wounds/calciphylaxis: VDRA on hold, getting Na Thiosulfate with HD.  ESRD: Usual MWF schedule  HypoTN/volume: Chronically low BP, on mido. Still making some urine, s/p IV Lasix  - d/c now. Anemia of ESRD: Hgb 7.7 - continue Aranesp  q Monday this admit.  IV iron  on hold d/t infection. Secondary HPTH: CorrCa ok, not on VDRA. Phos improved with starting sevelamer . Nutrition: Alb low, continue supplements. T2DM    Calvin Ronnald Acosta PA-C [facility] 01/22/2024,11:21 AM

## 2024-01-22 NOTE — Progress Notes (Signed)
 PT Cancellation Note  Patient Details Name: Calvin Schultz MRN: [id number] DOB: 08/25/1950   Cancelled Treatment:    Reason Eval/Treat Not Completed: Patient at procedure or test/unavailable (Pt off floor for HD. Will follow-up for PT treatment as schedule permits.)  Randall SAUNDERS, PT, DPT Acute Rehabilitation Services Office: [phone] Secure Chat Preferred  Delon CHRISTELLA Callander 01/22/2024, 9:03 AM

## 2024-01-22 NOTE — Progress Notes (Signed)
 [facility] Progress Note Patient: Calvin Schultz FMW:[id number] DOB: 03/08/1951  DOA: 01/14/2024 DOS: the patient was seen and examined on 01/22/2024  Brief Hospital Course: Patient is a 73 yo M w/ pertinent PMH ESRD on HD MWF, T2DM, chronic A-fib, diastolic CHF, HTN, HLD presents to [facility] on 9/11 with AMS. Per patient's wife patient noted to be progressively more altered with poor po intake. In the ED, pt noted to be febrile 100.3 F and hypotensive. Labs showed Wbc 47 and LA 3.6. CXR w/ b/l infiltrate vs. atelectasis. B/l leg wound present. CT head negative for acute abnormality. Despite iv fluids patient remained hypotensive started on levo. Of note, pt follows outpt w/ [facility] for b/l leg venous stasis ulcer for debridement and wound care. Had dvt us  and abi of ble on 9/11 which were normal. Pccm consulted for icu admission. While in the ICU, patient was subsequently weaned off pressors on 9/12. Patient further stabilized and [facility] hospitalist resumed care on 9/14.   Assessment and Plan: Septic shock MSSA bacteremia likely 2/2 BLE wound/calciphylaxis Currently afebrile, with resolving leukocytosis BP better controlled, off pressors since 9/12 Lactic acidosis has resolved Blood cultures growing MSSA, repeat BC x 2 no growth till date UA was never collected X-rays of bilateral foot were negative for osteomyelitis ID on board, recommend removal of tunneled HD catheter Vascular surgery consulted, s/p TDC removed on 01/18/2024, plan for replacement on 01/20/2024 TTE with EF of 55%, no regional wall motion abnormality, noted right ventricular volume overload.  No evidence of valvular vegetation noted S/p TEE, noted typical appearance of a shaggy vegetation on the left coronary cusp of the aortic valve Continue midodrine  Continue cefazolin , EOT 9/28, ID follow-up on 10/16.   Acute metabolic encephalopathy Resolved Likely from septic shock CT negative for acute abnormality Monitor for  receiving aggressive pain medication.   ESRD on HD MWF BLE calciphylaxis Uncontrolled pain. Consulted nephrology Patient reports no control of pain with his current regimen. Pain regimen adjusted.  Will continue current pain medication regimen despite patient complain of uncontrolled pain. At risk for further hypotension as well as confusion.  Monitor clinically.   Anemia of ESRD Hb somewhat around baseline Anemia panel showed iron  52, ferritin 1284, sats 27 Daily CBC   Diastolic CHF HTN HLD Volume management by HD as well as Lasix  Continue Lasix    T2DM A1c 5.6 SSI, Accu-Cheks, hypoglycemia protocol   Chronic A-fib  Rate controlled, not on anticoagulation due to previous acute blood loss from hematuria Not on rate controlling medication   Obesity class I Body mass index is 34.53 kg/m.  Lifestyle modification advised   Subjective: Pain still not well-controlled although better compared to yesterday.  No nausea no vomiting no fever no chills.  Physical Exam: Clear to auscultation. S1-S2 present Lower extremity wrapped.  Data Reviewed: I have Reviewed nursing notes, Vitals, and Lab results. Since last encounter, pertinent lab results CBC and BMP   . I have ordered test including CBC and BMP  .   Disposition: Status is: Inpatient Remains inpatient appropriate because: Monitor for improvement in pain control  heparin  injection 5,000 Units Start: 01/15/24 0600  Family Communication: No one at bedside Level of care: Telemetry Medical   Vitals:   01/22/24 1226 01/22/24 1227 01/22/24 1347 01/22/24 1410  BP: (!) 90/50 109/60 112/63 118/63  Pulse: 62 61 61 61  Resp: 13 11 18 18   Temp:  97.9 F (36.6 C) 97.9 F (36.6 C) 97.8 F (36.6 C)  TempSrc:  Oral Oral  SpO2: 100% 100% 100% 100%  Weight:  100.8 kg    Height:         Author: Yetta Blanch, MD 01/22/2024 6:09 PM  Please look on [email or web address] to find out who is on call.

## 2024-01-22 NOTE — Plan of Care (Addendum)

## 2024-01-23 ENCOUNTER — Inpatient Hospital Stay (HOSPITAL_COMMUNITY)

## 2024-01-23 DIAGNOSIS — R6521 Severe sepsis with septic shock: Secondary | ICD-10-CM | POA: Diagnosis not present

## 2024-01-23 DIAGNOSIS — A419 Sepsis, unspecified organism: Secondary | ICD-10-CM | POA: Diagnosis not present

## 2024-01-23 LAB — CBC
HCT: 24.7 % — ABNORMAL LOW (ref 39.0–52.0)
Hemoglobin: 7.7 g/dL — ABNORMAL LOW (ref 13.0–17.0)
MCH: 28.3 pg (ref 26.0–34.0)
MCHC: 31.2 g/dL (ref 30.0–36.0)
MCV: 90.8 fL (ref 80.0–100.0)
Platelets: 281 K/uL (ref 150–400)
RBC: 2.72 MIL/uL — ABNORMAL LOW (ref 4.22–5.81)
RDW: 16.5 % — ABNORMAL HIGH (ref 11.5–15.5)
WBC: 17.6 K/uL — ABNORMAL HIGH (ref 4.0–10.5)
nRBC: 0 % (ref 0.0–0.2)

## 2024-01-23 LAB — GLUCOSE, CAPILLARY
Glucose-Capillary: 100 mg/dL — ABNORMAL HIGH (ref 70–99)
Glucose-Capillary: 137 mg/dL — ABNORMAL HIGH (ref 70–99)
Glucose-Capillary: 89 mg/dL (ref 70–99)
Glucose-Capillary: 96 mg/dL (ref 70–99)

## 2024-01-23 LAB — RENAL FUNCTION PANEL
Albumin: 2 g/dL — ABNORMAL LOW (ref 3.5–5.0)
Anion gap: 19 — ABNORMAL HIGH (ref 5–15)
BUN: 26 mg/dL — ABNORMAL HIGH (ref 8–23)
CO2: 23 mmol/L (ref 22–32)
Calcium: 9 mg/dL (ref 8.9–10.3)
Chloride: 94 mmol/L — ABNORMAL LOW (ref 98–111)
Creatinine, Ser: 3.96 mg/dL — ABNORMAL HIGH (ref 0.61–1.24)
GFR, Estimated: 16 mL/min — ABNORMAL LOW (ref >60)
Glucose, Bld: 116 mg/dL — ABNORMAL HIGH (ref 70–99)
Phosphorus: 4.8 mg/dL — ABNORMAL HIGH (ref 2.5–4.6)
Potassium: 4.1 mmol/L (ref 3.5–5.1)
Sodium: 136 mmol/L (ref 135–145)

## 2024-01-23 LAB — MAGNESIUM: Magnesium: 1.9 mg/dL (ref 1.7–2.4)

## 2024-01-23 MED ORDER — DOCUSATE SODIUM 100 MG PO CAPS: 100.0000 mg | ORAL_CAPSULE | Freq: Two times a day (BID) | ORAL | 0 refills | Status: DC | PRN

## 2024-01-23 MED ORDER — OXYCODONE-ACETAMINOPHEN 10-325 MG PO TABS: 1.0000 | ORAL_TABLET | Freq: Four times a day (QID) | ORAL | 0 refills | Status: DC | PRN

## 2024-01-23 MED ORDER — SEVELAMER CARBONATE 800 MG PO TABS: 1600.0000 mg | ORAL_TABLET | Freq: Three times a day (TID) | ORAL | 0 refills | Status: DC

## 2024-01-23 MED ORDER — PROSOURCE PLUS PO LIQD: 30.0000 mL | Freq: Two times a day (BID) | ORAL | 0 refills | Status: DC

## 2024-01-23 MED ORDER — NEPRO/CARBSTEADY PO LIQD: 237.0000 mL | Freq: Three times a day (TID) | ORAL | 0 refills | Status: DC

## 2024-01-23 MED ORDER — POLYETHYLENE GLYCOL 3350 17 G PO PACK: 17.0000 g | PACK | Freq: Every day | ORAL | 0 refills | Status: DC

## 2024-01-23 MED ORDER — MIDODRINE HCL 10 MG PO TABS: 10.0000 mg | ORAL_TABLET | Freq: Three times a day (TID) | ORAL | 0 refills | Status: DC

## 2024-01-23 NOTE — Progress Notes (Signed)
 recheck patients oxygen sats without oxygen patient sats ranges from 87% - 88%

## 2024-01-23 NOTE — Progress Notes (Signed)
 SATURATION QUALIFICATIONS: (This note is used to comply with regulatory documentation for home oxygen)  Patient Saturations on Room Air at Rest = 89%  Patient is non ambulatory   Patient saturations on 2 Liters of oxygen at rest = 97%

## 2024-01-23 NOTE — Progress Notes (Signed)
 [facility] Progress Note   Subjective:  Seen in room. Completed dialysis yesterday with 4L UF. Feeling better. Reports plans for discharge today.   Objective Vitals:   01/22/24 1940 01/23/24 0430 01/23/24 0500 01/23/24 0755  BP: 117/65 (!) 129/97  (!) 90/54  Pulse: 62 62  (!) 59  Resp: 15 16  20   Temp: 97.8 F (36.6 C) 97.9 F (36.6 C)  98 F (36.7 C)  TempSrc:  Oral  Oral  SpO2: 97% 100%  98%  Weight:   108.1 kg   Height:       Physical Exam General: Well appearing man, NAD. [place] in place Heart: RRR; no murmur Lungs: Bibasilar rales present Abdomen: distended, soft Extremities: trace BLE edema (wounds wrapped) - pics c/w calciphylaxis, R arm bandaged as well Dialysis Access: R femoral TDC  Additional Objective Labs: Basic Metabolic Panel: Recent Labs  Lab 01/21/24 0613 01/22/24 0505 01/23/24 0306  NA 138 133* 136  K 4.5 4.9 4.1  CL 93* 94* 94*  CO2 25 23 23   GLUCOSE 88 95 116*  BUN 40* 46* 26*  CREATININE 4.57* 5.56* 3.96*  CALCIUM  9.0 9.0 9.0  PHOS 5.4* 6.8* 4.8*   Liver Function Tests: Recent Labs  Lab 01/21/24 0613 01/22/24 0505 01/23/24 0306  ALBUMIN  2.0* 2.0* 2.0*   CBC: Recent Labs  Lab 01/18/24 0454 01/19/24 0426 01/20/24 0450 01/21/24 0613 01/22/24 0505 01/23/24 0306  WBC 15.0* 16.3* 14.1* 15.0* 16.8* 17.6*  NEUTROABS 11.6* 12.2* 10.1*  --   --   --   HGB 7.8* 7.6* 7.5* 7.7* 7.8* 7.7*  HCT 26.3* 24.9* 24.5* 24.6* 24.8* 24.7*  MCV 93.3 91.9 92.1 90.4 90.8 90.8  PLT 169 180 191 225 261 281   Studies/Results: No results found.  Medications:   ceFAZolin  (ANCEF ) IV 2 g (01/22/24 1748)   sodium thiosulfate  25 g in sodium chloride  0.9 % 200 mL Infusion for Calciphylaxis 25 g (01/22/24 1130)    (feeding supplement) PROSource Plus  30 mL Oral BID BM   Chlorhexidine  Gluconate Cloth  6 each Topical Q0600   darbepoetin (ARANESP ) injection - DIALYSIS  150 mcg Subcutaneous Q Mon-1800   feeding supplement (NEPRO CARB STEADY)   237 mL Oral TID BM   gabapentin   100 mg Oral TID   heparin   5,000 Units Subcutaneous Q8H   insulin  aspart  0-6 Units Subcutaneous TID WC   iron  polysaccharides  150 mg Oral Daily   midodrine   10 mg Oral TID WC   sevelamer  carbonate  1,600 mg Oral TID WC   silver  sulfADIAZINE    Topical Daily   sodium chloride  flush  10-40 mL Intracatheter Q12H   tamsulosin   0.4 mg Oral QPC supper   tiZANidine   4 mg Oral TID   Dialysis Orders MWF - NW 4hr, 400/A2, EDW 97kg, 3K/2.5Ca bath, TDC, no heparin  - Mircera 100mcg IV q 2 weeks (last 9/1) - Venofer  100mg  x 10 - no VDRA   Assessment/Plan: Septic shock/MSSA bacteremia: Blood Cx 9/11 +, repeat Cx 9/12 negative. Source - TDC v. leg wounds. On Cefazolin . TDC removed 9/15, new line placed 9/17. WBC high but stable. No endocarditis per ID. OUTPATIENT ABX PLAN will be Cefazolin  2g q HD x 2 weeks from [facility] removal - end date 02/01/24) BLE wounds/calciphylaxis: VDRA on hold, getting Na Thiosulfate with HD.  ESRD: Usual MWF schedule  HypoTN/volume: Chronically low BP, on mido. Still making some urine, s/p IV Lasix  - d/c now. Anemia of ESRD: Hgb 7.7 -  continue Aranesp  q Monday this admit.  IV iron  on hold d/t infection. Secondary HPTH: CorrCa ok, not on VDRA. Phos improved with starting sevelamer . Nutrition: Alb low, continue supplements. T2DM    Maisie Ronnald Acosta PA-C [facility] 01/23/2024,10:02 AM

## 2024-01-23 NOTE — TOC Transition Note (Addendum)
 Transition of Care [facility]) - Discharge Note   Patient Details  Name: Calvin Schultz MRN: [id number] Date of Birth: [birth year]  Transition of Care [facility]) CM/SW Contact:  Robynn Eileen Hoose, RN Phone Number: 01/23/2024, 10:15 AM   Clinical Narrative:   Patient is being discharged today. Amy with [facility] aware. Orders for home oxygen noted. Spoke with Patient, no oxygen company preference. Home oxygen ordered by Jermaine with [employer] to be delivered to bedside before patient discharges home.  1415: Spoke with family, pt will need PTAR transportation home. Family confirmed that they will transport home oxygen tank to the house. PTAR forms completed and sent to unit. PTAR aware of patient transportation needs.    Final next level of care: Home w Home Health Services Barriers to Discharge: No Barriers Identified   Patient Goals and CMS Choice Patient states their goals for this hospitalization and ongoing recovery are:: to return home with [facility]          Discharge Placement                       Discharge Plan and Services Additional resources added to the After Visit Summary for     Discharge Planning Services: CM Consult Post Acute Care Choice: Home Health, Durable Medical Equipment          DME Arranged: Oxygen DME Agency: [employer] Date DME Agency Contacted: 01/23/24 Time DME Agency Contacted: 1013 Representative spoke with at DME Agency: [place] HH Arranged: PT, RN HH Agency: [facility] Date [facility] Agency Contacted: 01/15/24 Time HH Agency Contacted: 1605 Representative spoke with at [facility] Agency: Amy  Social Drivers of Health (SDOH) Interventions SDOH Screenings   Food Insecurity: No Food Insecurity (01/15/2024)  Housing: Low Risk  (01/15/2024)  Transportation Needs: No Transportation Needs (01/15/2024)  Utilities: Not At Risk (01/15/2024)  Social Connections: Unknown (01/15/2024)  Tobacco Use: Low Risk  (01/14/2024)     Readmission Risk  Interventions    07/10/2023   11:21 AM 06/27/2023    1:00 PM  Readmission Risk Prevention Plan  Transportation Screening Complete Complete  PCP or Specialist Appt within 5-7 Days  Complete  PCP or Specialist Appt within 3-5 Days Complete   Home Care Screening  Complete  Medication Review (RN CM)  Complete  HRI or Home Care Consult Complete   Social Work Consult for Recovery Care Planning/Counseling Complete   Palliative Care Screening Not Applicable   Medication Review Oceanographer) Referral to Pharmacy

## 2024-01-23 NOTE — Progress Notes (Signed)
 Patient discharging home via ptar, AVS reviewed at bedside with wife and patient, iv access removed.

## 2024-01-23 NOTE — Discharge Summary (Signed)
 Physician Discharge Summary   Patient: Calvin Schultz MRN: [id number] DOB: [birth year]  Admit date:     01/14/2024  Discharge date: 01/23/24  Discharge Physician: Yetta Blanch  PCP: Jesus Elberta Gainer, FNP  Recommendations at discharge: Follow-up with PCP in 1 week. Follow-up with ID as recommended.   Follow-up Information     Jesus Elberta Gainer, FNP. Schedule an appointment as soon as possible for a visit in 1 week(s).   Specialty: Family Medicine Contact information: [address] [place] [address] [phone]         Dennise Kingsley, MD. Go to.   Specialty: Infectious Diseases Why: the Appointment As Scheduled Contact information: [address], [address] [place] [address] [phone]                Hospital Course: Patient is a 73 yo M w/ pertinent PMH ESRD on HD MWF, T2DM, chronic A-fib, diastolic CHF, HTN, HLD presents to [facility] on 9/11 with AMS. Per patient's wife patient noted to be progressively more altered with poor po intake. In the ED, pt noted to be febrile 100.3 F and hypotensive. Labs showed Wbc 47 and LA 3.6. CXR w/ b/l infiltrate vs. atelectasis. B/l leg wound present. CT head negative for acute abnormality. Despite iv fluids patient remained hypotensive started on levo. Of note, pt follows outpt w/ [facility] for b/l leg venous stasis ulcer for debridement and wound care. Had dvt us  and abi of ble on 9/11 which were normal. Pccm consulted for icu admission. While in the ICU, patient was subsequently weaned off pressors on 9/12. Patient further stabilized and [facility] hospitalist resumed care on 9/14.   Assessment and Plan: Septic shock MSSA bacteremia likely 2/2 BLE wound/calciphylaxis Currently afebrile, with resolving leukocytosis BP better controlled, off pressors since 9/12 Lactic acidosis has resolved Blood cultures growing MSSA, repeat BC x 2 no growth till date UA was never collected X-rays of bilateral foot were negative  for osteomyelitis ID on board, recommend removal of tunneled HD catheter Vascular surgery consulted, s/p TDC removed on 01/18/2024, plan for replacement on 01/20/2024 TTE with EF of 55%, no regional wall motion abnormality, noted right ventricular volume overload.  No evidence of valvular vegetation noted S/p TEE, noted typical appearance of a shaggy vegetation on the left coronary cusp of the aortic valve Continue midodrine  Continue cefazolin , EOT 9/29, ID follow-up on 10/16.   Acute metabolic encephalopathy Resolved Likely from septic shock CT negative for acute abnormality Mentation remaining stable on Oxy 10.   ESRD on HD MWF BLE calciphylaxis Uncontrolled pain. Consulted nephrology Received sodium thiosulfate  with hemodialysis here in the hospital. At risk for further hypotension as well as confusion.   For now we will continue Percocet 10 for pain control.   Anemia of ESRD Hb somewhat around baseline Anemia panel showed iron  52, ferritin 1284, sats 27 Daily CBC   Diastolic CHF HTN HLD Volume management by HD as well as Lasix  Continue Lasix    T2DM A1c 5.6 Resume home regimen.   Chronic A-fib  Rate controlled, not on anticoagulation due to previous acute blood loss from hematuria Not on rate controlling medication   Obesity class I Body mass index is 34.53 kg/m.  Lifestyle modification advised  Pain control - [place]  Controlled Substance Reporting System database was reviewed. and patient was instructed, not to drive, operate heavy machinery, perform activities at heights, swimming or participation in water  activities or provide baby-sitting services while on Pain, Sleep and Anxiety  Medications; until their outpatient Physician has advised to do so again. Also recommended to not to take more than prescribed Pain, Sleep and Anxiety Medications.  Consultants:  Nephrology ID Vascular surgery  Procedures performed:  Echocardiogram TEE [id number] [facility]  placement  DISCHARGE MEDICATION: Allergies as of 01/23/2024   No Known Allergies      Medication List     STOP taking these medications    furosemide  80 MG tablet Commonly known as: Lasix    HYDROcodone -acetaminophen  5-325 MG tablet Commonly known as: NORCO/VICODIN   psyllium 0.52 g capsule Commonly known as: REGULOID       TAKE these medications    (feeding supplement) PROSource Plus liquid Take 30 mLs by mouth 2 (two) times daily between meals.   feeding supplement (NEPRO CARB STEADY) Liqd Take 237 mLs by mouth 3 (three) times daily between meals.   acetaminophen  325 MG tablet Commonly known as: TYLENOL  Take 2 tablets (650 mg total) by mouth every 4 (four) hours as needed for mild pain (pain score 1-3) or fever.   ceFAZolin  IVPB Commonly known as: ANCEF  Inject 2 g into the vein every Monday, Wednesday, and Friday with hemodialysis for 12 days. Indication:  MSSA Bacteremia First Dose: Yes Last Day of Therapy:  02/01/24   docusate sodium  100 MG capsule Commonly known as: COLACE Take 1 capsule (100 mg total) by mouth 2 (two) times daily as needed for mild constipation.   gabapentin  100 MG capsule Commonly known as: NEURONTIN  Take 100 mg by mouth at bedtime.   Gerhardt's butt cream Crea Apply 1 Application topically 2 (two) times daily.   iron  polysaccharides 150 MG capsule Commonly known as: NIFEREX Take 1 capsule (150 mg total) by mouth daily.   Lantus SoloStar 100 UNIT/ML Solostar Pen Generic drug: insulin  glargine Inject 3-4 Units into the skin daily as needed (for hyperglycemia).   midodrine  10 MG tablet Commonly known as: PROAMATINE  Take 1 tablet (10 mg total) by mouth 3 (three) times daily with meals.   multivitamin Tabs tablet Take 1 tablet by mouth at bedtime.   oxyCODONE -acetaminophen  10-325 MG tablet Commonly known as: Percocet Take 1 tablet by mouth every 6 (six) hours as needed for pain.   polyethylene glycol 17 g packet Commonly known  as: MIRALAX  / GLYCOLAX  Take 17 g by mouth daily.   sevelamer  carbonate 800 MG tablet Commonly known as: RENVELA  Take 2 tablets (1,600 mg total) by mouth 3 (three) times daily with meals.   silver  sulfADIAZINE  1 % cream Commonly known as: SILVADENE  Apply 1 Application topically daily. Apply to legs   tamsulosin  0.4 MG Caps capsule Commonly known as: FLOMAX  Take 1 capsule (0.4 mg total) by mouth daily after supper. What changed: when to take this   tiZANidine  4 MG tablet Commonly known as: ZANAFLEX  Take 1 tablet (4 mg total) by mouth 2 (two) times daily as needed for muscle spasms.   traZODone  50 MG tablet Commonly known as: DESYREL  Take 1 tablet (50 mg total) by mouth at bedtime as needed for sleep.               Home Infusion Instuctions  (From admission, onward)           Start     Ordered   01/20/24 0000  Home infusion instructions       Question:  Instructions  Answer:  Flushing of vascular access device: 0.9% NaCl pre/post medication administration and prn patency; Heparin  100 u/ml, 5ml for implanted ports and Heparin  10u/ml, 5ml  for all other central venous catheters.   01/20/24 1518              Discharge Care Instructions  (From admission, onward)           Start     Ordered   01/23/24 0000  Discharge wound care:       Comments: 1. Cleanse both arm wounds/skin tears with NS, apply Vaseline gauze to wound beds daily and secure with silicone foam or Kerlix roll gauze whichever is preferred.    2. Cleanse both lower leg wounds with Vashe, do not rinse and allow to air dry. Apply Silvadene  cream to wound beds daily, cover with Telfa nonstick dressing.  Cleanse R heel and plantar foot wounds with Vashe, apply silver  hydrofiber (Aquacel ANDRIA Collum [phone]) to wound beds daily.  Secure entire dressing  with Kerlix roll gauze beginning right above toes and ending right below knees.  May apply Ace bandage wrapped in same fashion as Kerlix for light  compression.  REMOVE OLD SILVADENE  WITH NS PRIOR TO APPLYING NEW.   3. Cleanse sacrum with Vashe, apply Xeroform gauze to wound bed daily and secure with silicone foam.   01/23/24 1005           Disposition: Home Diet recommendation: Renal diet  Discharge Exam: Vitals:   01/23/24 0430 01/23/24 0500 01/23/24 0755 01/23/24 1516  BP: (!) 129/97  (!) 90/54 (!) 109/56  Pulse: 62  (!) 59 (!) 59  Resp: 16  20 18   Temp: 97.9 F (36.6 C)  98 F (36.7 C) 98.4 F (36.9 C)  TempSrc: Oral  Oral Oral  SpO2: 100%  98% 95%  Weight:  108.1 kg    Height:       Clear to auscultation. S1-S2 present  bowel sound present. Trace edema bilaterally.  Filed Weights   01/22/24 0833 01/22/24 1227 01/23/24 0500  Weight: 110.7 kg 100.8 kg 108.1 kg   Condition at discharge: stable  The results of significant diagnostics from this hospitalization (including imaging, microbiology, ancillary and laboratory) are listed below for reference.   Imaging Studies: ECHO TEE Result Date: 01/21/2024    TRANSESOPHOGEAL ECHO REPORT   Patient Name:   Calvin Schultz Date of Exam: 01/19/2024 Medical Rec #:  [id number]   Height:       68.0 in Accession #:    [id number]  Weight:       224.0 lb Date of Birth:  1950/09/01  BSA:          2.145 m Patient Age:    72 years    BP:           106/58 mmHg Patient Gender: M           HR:           77 bpm. Exam Location:  Inpatient Procedure: Transesophageal Echo, Cardiac Doppler, Color Doppler and 3D Echo            (Both Spectral and Color Flow Doppler were utilized during            procedure). Indications:     Subacute bacterial endocarditis;  History:         Patient has prior history of Echocardiogram examinations, most                  recent 01/17/2024. Abnormal ECG, Arrythmias:Atrial Fibrillation;                  Signs/Symptoms:Edema and Bacteremia. ESRD.  Sonographer:     Ellouise Mose RDCS Referring Phys:  [id number] WADDELL A PARCELLS Diagnosing Phys: Wilbert Bihari MD PROCEDURE: After  discussion of the risks and benefits of a TEE, an informed consent was obtained from the patient. TEE procedure time was 9 minutes. The transesophogeal probe was passed without difficulty through the esophogus of the patient. Imaged were  obtained with the patient in a left lateral decubitus position. Sedation performed by different physician. The patient was monitored while under deep sedation. Anesthestetic sedation was provided intravenously by Anesthesiology: 160mg  of Propofol , 80mg  of Lidocaine . Image quality was excellent. The patient's vital signs; including heart rate, blood pressure, and oxygen saturation; remained stable throughout the procedure. The patient developed no complications during the procedure.  IMPRESSIONS  1. Left ventricular ejection fraction, by estimation, is 50 to 55%. The left ventricle has low normal function. The left ventricle has no regional wall motion abnormalities.  2. Right ventricular systolic function is mildly reduced. The right ventricular size is moderately enlarged.  3. No left atrial/left atrial appendage thrombus was detected. The LAA emptying velocity was 44 cm/s.  4. The mitral valve is degenerative. Mild mitral valve regurgitation. No evidence of mitral stenosis.  5. 3D performed of the mitral valve and demonstrates No evidence of mitral valve vegetation.  6. Tricuspid valve regurgitation is mild to moderate.  7. There is focal calcification of the left coronary cusp that is consistent with calcific degenerative and does not have the typical appearance of a shaggy vegetation. The aortic valve is tricuspid. Aortic valve regurgitation is not visualized. Aortic valve sclerosis/calcification is present, without any evidence of aortic stenosis.  8. There is mild (Grade II) layered plaque involving the ascending aorta and aortic arch.  9. The inferior vena cava is normal in size with greater than 50% respiratory variability, suggesting right atrial pressure of 3 mmHg. 10.  Evidence of atrial level shunting detected by color flow Doppler. Agitated saline contrast bubble study was positive with shunting observed within 3-6 cardiac cycles suggestive of interatrial shunt. There is a moderately sized patent foramen ovale with predominantly left to right shunting across the atrial septum. 11. Right atrial size was mildly dilated. Conclusion(s)/Recommendation(s): No evidence of vegetation/infective endocarditis on this transesophageael echocardiogram. FINDINGS  Left Ventricle: Left ventricular ejection fraction, by estimation, is 50 to 55%. The left ventricle has low normal function. The left ventricle has no regional wall motion abnormalities. The left ventricular internal cavity size was normal in size. There is no left ventricular hypertrophy. Right Ventricle: The right ventricular size is moderately enlarged. No increase in right ventricular wall thickness. Right ventricular systolic function is mildly reduced. Left Atrium: Left atrial size was normal in size. No left atrial/left atrial appendage thrombus was detected. The LAA emptying velocity was 44 cm/s. Right Atrium: Right atrial size was mildly dilated. Pericardium: There is no evidence of pericardial effusion. Mitral Valve: The mitral valve is degenerative in appearance. There is mild calcification of the mitral valve leaflet(s). Mild mitral annular calcification. Mild mitral valve regurgitation. No evidence of mitral valve stenosis. Tricuspid Valve: The tricuspid valve is normal in structure. Tricuspid valve regurgitation is mild to moderate. No evidence of tricuspid stenosis. Aortic Valve: There is focal calcification of the left coronary cusp that is consistent with calcific degenerative and does have the typical appearance of a shaggy vegetation. The aortic valve is tricuspid. Aortic valve regurgitation is not visualized. Aortic valve sclerosis/calcification is present, without any evidence of aortic stenosis. Pulmonic Valve:  The  pulmonic valve was not well visualized. Pulmonic valve regurgitation is not visualized. No evidence of pulmonic stenosis. Aorta: The aortic root is normal in size and structure. There is mild (Grade II) layered plaque involving the ascending aorta and aortic arch. Venous: The inferior vena cava is normal in size with greater than 50% respiratory variability, suggesting right atrial pressure of 3 mmHg. IAS/Shunts: There is redundancy of the interatrial septum. Evidence of atrial level shunting detected by color flow Doppler. Agitated saline contrast was given intravenously to evaluate for intracardiac shunting. Agitated saline contrast bubble study was  positive with shunting observed within 3-6 cardiac cycles suggestive of interatrial shunt. A moderately sized patent foramen ovale is detected with predominantly left to right shunting across the atrial septum. Additional Comments: 3D was performed not requiring image post processing on an independent workstation and was normal. Spectral Doppler performed. Wilbert Bihari MD Electronically signed by Wilbert Bihari MD Signature Date/Time: 01/19/2024/4:16:33 PM    Final (Updated)    PERIPHERAL VASCULAR CATHETERIZATION Result Date: 01/20/2024 Images from the original result were not included.    Patient name: Calvin Schultz     MRN: [id number]        DOB: [birth year]        Sex: male  01/20/2024 Pre-operative Diagnosis: End-stage renal disease requiring dialysis Post-operative diagnosis:  Same Surgeon:  Fonda FORBES Rim, MD Procedure Performed: 1.  Ultrasound-guided micropuncture access of the right common femoral vein 2.  Right common femoral vein tunneled dialysis catheter placement 44 cm palindrome   Indications: Patient is a 73 year old male with end-stage renal disease requiring dialysis.  He currently has no access after his right sided tunneled dialysis catheter was pulled due to concern for infection.  He presents today for new tunneled dialysis catheter placement.   There is some concern that he has endocarditis, therefore we discussed right leg tunneled dialysis catheter with the tip left in the pararenal IVC.  Findings: 44 cm palindrome catheter with tip inferior to the cavoatrial junction             Procedure:  The patient was identified in the holding area and taken to room 8.  The patient was then placed supine on the table and prepped and draped in the usual sterile fashion.  A time out was called.  Ultrasound was used to evaluate the right common femoral vein.  The vein was accessed using an ultrasound-guided micropuncture needle after local anesthetic was administered.  Next, a wire was placed using Seldinger technique, followed by serial dilation of the venotomy to the peel-away sheath.  Next, a counterincision was made on the right thigh, the tract was anesthetized and the catheter pulled through the track to the right groin.  It was then placed on the wire and delivered to the inferior vena cava using Seldinger technique the peel-away sheath was removed.  The tip of the catheter was inferior to the cavoatrial junction.  The right groin was irrigated, and closed using 4-0 Monocryl suture with Dermabond at the level of the skin.  The catheter was held in place using 2-0 nylon suture.  Catheter flushed easily. Catheter was heparin  locked.  Caps were applied.      Fonda FORBES Rim MD [facility] Office: [phone]   EP STUDY Result Date: 01/19/2024 See surgical note for result.  ECHOCARDIOGRAM COMPLETE Result Date: 01/17/2024    ECHOCARDIOGRAM REPORT   Patient Name:   Calvin Schultz Date of Exam: 01/17/2024 Medical Rec #:  [id number]   Height:       68.0 in Accession #:    [id number]  Weight:       224.0 lb Date of Birth:  1950-08-12  BSA:          2.145 m Patient Age:    72 years    BP:           113/68 mmHg Patient Gender: M           HR:           60 bpm. Exam Location:  Inpatient Procedure: 2D Echo, Cardiac Doppler, Color Doppler  and Intracardiac            Opacification Agent (Both Spectral and Color Flow Doppler were            utilized during procedure). Indications:     Endocarditis I38  History:         Patient has prior history of Echocardiogram examinations, most                  recent 10/13/2023. Arrythmias:Atrial Fibrillation; Risk                  Factors:Hypertension and Diabetes.  Sonographer:     Jayson Gaskins Referring Phys:  [id number] [facility] Diagnosing Phys: Soyla Merck MD IMPRESSIONS  1. Left ventricular ejection fraction, by estimation, is 55%. The left ventricle has normal function. The left ventricle has no regional wall motion abnormalities. There is moderate left ventricular hypertrophy. Left ventricular diastolic parameters are  indeterminate. There is the interventricular septum is flattened in diastole ('D' shaped left ventricle), consistent with right ventricular volume overload.  2. Right ventricular systolic function is moderately reduced. The right ventricular size is moderately enlarged. Tricuspid regurgitation signal is inadequate for assessing PA pressure.  3. The mitral valve is degenerative. Mild mitral valve regurgitation. No evidence of mitral stenosis. Moderate mitral annular calcification.  4. The aortic valve is tricuspid. There is mild calcification of the aortic valve. Aortic valve regurgitation is not visualized. No aortic stenosis is present.  5. Aortic dilatation noted. There is borderline dilatation of the ascending aorta, measuring 42 mm.  6. The inferior vena cava is dilated in size with <50% respiratory variability, suggesting right atrial pressure of 15 mmHg. Conclusion(s)/Recommendation(s): No evidence of valvular vegetations on this transthoracic echocardiogram though image quality is suboptimal for assessment. Consider a transesophageal echocardiogram to exclude infective endocarditis if clinically indicated. FINDINGS  Left Ventricle: Left ventricular ejection fraction, by  estimation, is 55%. The left ventricle has normal function. The left ventricle has no regional wall motion abnormalities. Definity  contrast agent was given IV to delineate the left ventricular endocardial borders. The left ventricular internal cavity size was normal in size. There is moderate left ventricular hypertrophy. The interventricular septum is flattened in diastole ('D' shaped left ventricle), consistent with right ventricular volume overload. Left ventricular diastolic parameters are indeterminate. Right Ventricle: The right ventricular size is moderately enlarged. No increase in right ventricular wall thickness. Right ventricular systolic function is moderately reduced. Tricuspid regurgitation signal is inadequate for assessing PA pressure. The tricuspid regurgitant velocity is 1.84 m/s, and with an assumed right atrial pressure of 15 mmHg, the estimated right ventricular systolic pressure is 28.5 mmHg. Left Atrium: Left atrial size was normal in size. Right Atrium: Right atrial size was normal in size. Pericardium: There is no evidence of pericardial effusion. Mitral Valve: The mitral valve is degenerative in appearance. Moderate mitral annular calcification.  Mild mitral valve regurgitation. No evidence of mitral valve stenosis. Tricuspid Valve: The tricuspid valve is normal in structure. Tricuspid valve regurgitation is mild . No evidence of tricuspid stenosis. Aortic Valve: The aortic valve is tricuspid. There is mild calcification of the aortic valve. Aortic valve regurgitation is not visualized. No aortic stenosis is present. Aortic valve mean gradient measures 3.0 mmHg. Aortic valve peak gradient measures 4.8 mmHg. Aortic valve area, by VTI measures 2.79 cm. Pulmonic Valve: The pulmonic valve was normal in structure. Pulmonic valve regurgitation is trivial. No evidence of pulmonic stenosis. Aorta: Aortic dilatation noted. There is borderline dilatation of the ascending aorta, measuring 42 mm.  Venous: The inferior vena cava is dilated in size with less than 50% respiratory variability, suggesting right atrial pressure of 15 mmHg. IAS/Shunts: The interatrial septum was not well visualized.  LEFT VENTRICLE PLAX 2D LVIDd:         4.90 cm LVIDs:         3.50 cm LV PW:         1.80 cm LV IVS:        1.40 cm LVOT diam:     1.90 cm LV SV:         58 LV SV Index:   27 LVOT Area:     2.84 cm  RIGHT VENTRICLE            IVC RV S prime:     8.59 cm/s  IVC diam: 2.80 cm TAPSE (M-mode): 1.6 cm LEFT ATRIUM             Index        RIGHT ATRIUM           Index LA Vol (A2C):   55.6 ml 25.93 ml/m  RA Area:     22.50 cm LA Vol (A4C):   70.1 ml 32.69 ml/m  RA Volume:   71.80 ml  33.48 ml/m LA Biplane Vol: 63.4 ml 29.56 ml/m  AORTIC VALVE AV Area (Vmax):    2.37 cm AV Area (Vmean):   2.43 cm AV Area (VTI):     2.79 cm AV Vmax:           110.00 cm/s AV Vmean:          76.100 cm/s AV VTI:            0.208 m AV Peak Grad:      4.8 mmHg AV Mean Grad:      3.0 mmHg LVOT Vmax:         91.80 cm/s LVOT Vmean:        65.200 cm/s LVOT VTI:          0.205 m LVOT/AV VTI ratio: 0.99  AORTA Ao Root diam: 3.60 cm Ao Asc diam:  4.20 cm MITRAL VALVE                TRICUSPID VALVE MV Area (PHT): 2.56 cm     TR Peak grad:   13.5 mmHg MV Decel Time: 296 msec     TR Vmax:        184.00 cm/s MV E velocity: 112.00 cm/s MV A velocity: 39.30 cm/s   SHUNTS MV E/A ratio:  2.85         Systemic VTI:  0.20 m                             Systemic Diam: 1.90 cm Soyla Merck  MD Electronically signed by Soyla Merck MD Signature Date/Time: 01/17/2024/12:40:39 PM    Final (Updated)    DG Tibia/Fibula Left Result Date: 01/15/2024 EXAM: 3 VIEW(S) XRAY OF THE LEFT TIBIA AND FIBULA 01/15/2024 02:10:56 AM COMPARISON: None available. CLINICAL HISTORY: Sepsis (HCC) [id number]. sepses FINDINGS: BONES AND JOINTS: No acute fracture. No focal osseous lesion. No joint dislocation. Advanced degenerative changes of the left knee. SOFT TISSUES: Diffuse  subcutaneous edema. Atherosclerotic changes throughout visualized arterial segments. IMPRESSION: 1. No acute findings. 2. Advanced degenerative changes of the left knee. Electronically signed by: Norman Gatlin MD 01/15/2024 02:15 AM EDT RP Workstation: [id number]   DG Foot 2 Views Right Result Date: 01/15/2024 CLINICAL DATA:  Sepsis EXAM: RIGHT FOOT - 2 VIEW COMPARISON:  None Available. FINDINGS: No acute bony abnormality. Specifically, no fracture, subluxation, or dislocation. No bone destruction. Soft tissues are intact. Vascular calcifications. No soft tissue gas. IMPRESSION: No acute bony abnormality. Electronically Signed   By: Franky Crease M.D.   On: 01/15/2024 01:54   DG Ankle 2 Views Left Result Date: 01/15/2024 CLINICAL DATA:  Sepsis EXAM: LEFT ANKLE - 2 VIEW COMPARISON:  None Available. FINDINGS: No acute bony abnormality. Specifically, no fracture, subluxation, or dislocation. No bone destruction. Soft tissues are intact. Vascular calcifications. No soft tissue gas. IMPRESSION: No acute bony abnormality. Electronically Signed   By: Franky Crease M.D.   On: 01/15/2024 01:54   DG Ankle 2 Views Right Result Date: 01/15/2024 CLINICAL DATA:  Sepsis EXAM: RIGHT ANKLE - 2 VIEW COMPARISON:  None Available. FINDINGS: No acute bony abnormality. Specifically, no fracture, subluxation, or dislocation. No bone destruction. Soft tissues are intact. Vascular calcifications. No soft tissue gas. IMPRESSION: No acute bony abnormality. Electronically Signed   By: Franky Crease M.D.   On: 01/15/2024 01:54   DG Foot 2 Views Left Result Date: 01/15/2024 CLINICAL DATA:  Sepsis EXAM: LEFT FOOT - 2 VIEW COMPARISON:  None available FINDINGS: No acute bony abnormality. Specifically, no fracture, subluxation, or dislocation. No bone destruction. Soft tissues are intact. Vascular calcifications. No soft tissue gas. IMPRESSION: No acute bony abnormality. Electronically Signed   By: Franky Crease M.D.   On: 01/15/2024 01:53    DG Tibia/Fibula Right Result Date: 01/15/2024 CLINICAL DATA:  Sepsis, open wound to both calves EXAM: RIGHT TIBIA AND FIBULA - 2 VIEW COMPARISON:  07/31/2023 FINDINGS: Soft tissue and vascular calcifications. Advanced degenerative changes in the right knee. No acute bony abnormality. Specifically, no fracture, subluxation, or dislocation. No bone destruction. No soft tissue gas. IMPRESSION: No acute bony abnormality. Electronically Signed   By: Franky Crease M.D.   On: 01/15/2024 01:53   CT Head Wo Contrast Result Date: 01/14/2024 CLINICAL DATA:  Head trauma EXAM: CT HEAD WITHOUT CONTRAST TECHNIQUE: Contiguous axial images were obtained from the base of the skull through the vertex without intravenous contrast. RADIATION DOSE REDUCTION: This exam was performed according to the departmental dose-optimization program which includes automated exposure control, adjustment of the mA and/or kV according to patient size and/or use of iterative reconstruction technique. COMPARISON:  None Available. FINDINGS: Brain: No acute territorial infarction, hemorrhage or intracranial mass. Patchy white matter hypodensity, probably chronic small vessel ischemic change. The ventricles are non enlarged. Vascular: No hyperdense vessels. Vertebral and carotid vascular calcification Skull: Normal. Negative for fracture or focal lesion. Sinuses/Orbits: No acute finding. Mucous retention cyst in the right maxillary sinus Other: None IMPRESSION: 1. No CT evidence for acute intracranial abnormality. 2. Patchy white matter hypodensity, probably chronic small  vessel ischemic change. Electronically Signed   By: Luke Bun M.D.   On: 01/14/2024 22:29   VAS US  ABI WITH/WO TBI Result Date: 01/14/2024  LOWER EXTREMITY DOPPLER STUDY Patient Name:  Calvin Schultz  Date of Exam:   01/14/2024 Medical Rec #: [id number]    Accession #:    [id number] Date of Birth: 04-17-1951   Patient Gender: M Patient Age:   32 years Exam Location:  [address] Procedure:      VAS US  ABI WITH/WO TBI Referring Phys: JOSHUA DILLEY --------------------------------------------------------------------------------  Indications: Ulceration. High Risk Factors: Diabetes, no history of smoking.  Limitations: Today's exam was limited due to patient positioning, patient              intolerant to cuff pressure, an open wound, Unna boot in place,              involuntary patient movement and bandages. Performing Technologist: Edsel Mustard RVT  Examination Guidelines: A complete evaluation includes at minimum, Doppler waveform signals and systolic blood pressure reading at the level of bilateral brachial, anterior tibial, and posterior tibial arteries, when vessel segments are accessible. Bilateral testing is considered an integral part of a complete examination. Photoelectric Plethysmograph (PPG) waveforms and toe systolic pressure readings are included as required and additional duplex testing as needed. Limited examinations for reoccurring indications may be performed as noted.  ABI Findings: +---------+------------------+-----+---------+--------+ Right    Rt Pressure (mmHg)IndexWaveform Comment  +---------+------------------+-----+---------+--------+ Brachial 141                                      +---------+------------------+-----+---------+--------+ PTA                             triphasic         +---------+------------------+-----+---------+--------+ DP       254               1.80 triphasic         +---------+------------------+-----+---------+--------+ [facility]               0.79 Normal            +---------+------------------+-----+---------+--------+ +---------+------------------+-----+-----------+-------+ Left     Lt Pressure (mmHg)IndexWaveform   Comment +---------+------------------+-----+-----------+-------+ PTA      254               1.80 multiphasic         +---------+------------------+-----+-----------+-------+ DP       254               1.80 triphasic          +---------+------------------+-----+-----------+-------+ Great Toe111               0.79 Normal             +---------+------------------+-----+-----------+-------+ Arterial wall calcification precludes accurate ankle pressures and ABIs. Waveforms demonstrate brisk, triphasic flow.  Summary: Right: Resting right ankle-brachial index indicates noncompressible right lower extremity arteries. The right toe-brachial index is normal.  Left: Resting left ankle-brachial index indicates noncompressible left lower extremity arteries. The left toe-brachial index is normal.  *See table(s) above for measurements and observations.  Electronically signed by Gordy Bergamo MD on 01/14/2024 at 9:01:54 PM.    Final    VAS US  LOWER EXTREMITY VENOUS REFLUX Result Date: 01/14/2024  Lower Venous Reflux Study Patient Name:  Calvin Schultz  Date of Exam:   01/14/2024 Medical Rec #: [id number]    Accession #:    [id number] Date of Birth: 31-Dec-1950   Patient Gender: M Patient Age:   13 years Exam Location:  [address] Procedure:      VAS US  LOWER EXTREMITY VENOUS REFLUX Referring Phys: FONDA MORITA --------------------------------------------------------------------------------  Indications: Ulceration. Patient had wounds that developed a few months ago from swelling and trauma. Significant edema.  Limitations: Bandages and open wound. Performing Technologist: Edsel Mustard RVT  Examination Guidelines: A complete evaluation includes B-mode imaging, spectral Doppler, color Doppler, and power Doppler as needed of all accessible portions of each vessel. Bilateral testing is considered an integral part of a complete examination. Limited examinations for reoccurring indications may be performed as noted. The reflux portion of the exam is performed with the patient in reverse Trendelenburg. Significant venous reflux is defined  as >500 ms in the superficial venous system, and >1 second in the deep venous system.  Venous Reflux Times +--------------+---------+------+-----------+------------+--------+ RIGHT         Reflux NoRefluxReflux TimeDiameter cmsComments                         Yes                                  +--------------+---------+------+-----------+------------+--------+ CFV           no                                             +--------------+---------+------+-----------+------------+--------+ FV mid        no                                             +--------------+---------+------+-----------+------------+--------+ Popliteal     no                                             +--------------+---------+------+-----------+------------+--------+ GSV at [facility]    no                            0.78             +--------------+---------+------+-----------+------------+--------+ GSV prox thighno                            0.55             +--------------+---------+------+-----------+------------+--------+ GSV mid thigh no                            0.50             +--------------+---------+------+-----------+------------+--------+ GSV dist thighno                            0.50             +--------------+---------+------+-----------+------------+--------+ GSV at knee   no  0.40             +--------------+---------+------+-----------+------------+--------+ GSV prox calf no                            0.39             +--------------+---------+------+-----------+------------+--------+ SSV at [facility]    no                            0.26             +--------------+---------+------+-----------+------------+--------+  +--------------+---------+------+-----------+------------+--------+ LEFT          Reflux NoRefluxReflux TimeDiameter cmsComments                         Yes                                   +--------------+---------+------+-----------+------------+--------+ CFV           no                                             +--------------+---------+------+-----------+------------+--------+ FV mid        no                                             +--------------+---------+------+-----------+------------+--------+ Popliteal     no                                             +--------------+---------+------+-----------+------------+--------+ GSV at [facility]    no                            0.72             +--------------+---------+------+-----------+------------+--------+ GSV prox thighno                            0.39             +--------------+---------+------+-----------+------------+--------+ GSV mid thigh no                            0.38             +--------------+---------+------+-----------+------------+--------+ GSV dist thighno                            0.36             +--------------+---------+------+-----------+------------+--------+ GSV at knee   no                            0.30             +--------------+---------+------+-----------+------------+--------+ GSV prox calf no  0.17             +--------------+---------+------+-----------+------------+--------+ Unable to assess mid and distal calf due to open wounds and bandages.  Summary: Bilateral: Limited examination due to patient pain, bandages and open wounds. - No evidence of deep vein thrombosis seen in the lower extremities, bilaterally, from the common femoral through the popliteal veins. - No evidence of superficial venous thrombosis in the lower extremities, bilaterally. - No evidence of deep venous insufficiency seen bilaterally in the lower extremity. - No evidence of superficial venous reflux seen in the greater saphenous veins bilaterally. - No evidence of superficial venous reflux seen in the short saphenous veins bilaterally.  *See  table(s) above for measurements and observations. Electronically signed by Gordy Bergamo MD on 01/14/2024 at 9:00:59 PM.    Final    DG Chest Port 1 View if patient is in a treatment room. Result Date: 01/14/2024 CLINICAL DATA:  Sepsis. EXAM: PORTABLE CHEST 1 VIEW COMPARISON:  Chest radiograph dated 10/13/2023. FINDINGS: Right sided dialysis catheter in similar position. There is shallow inspiration. There is cardiomegaly with vascular congestion. Bibasilar atelectasis or infiltrate. No pneumothorax. No acute osseous pathology. IMPRESSION: 1. Cardiomegaly with vascular congestion. 2. Bibasilar atelectasis or infiltrate. Electronically Signed   By: Vanetta Chou M.D.   On: 01/14/2024 20:35    Microbiology: Results for orders placed or performed during the hospital encounter of 01/14/24  Culture, blood (Routine x 2)     Status: Abnormal   Collection Time: 01/14/24  8:20 PM   Specimen: BLOOD RIGHT FOREARM  Result Value Ref Range Status   Specimen Description BLOOD RIGHT FOREARM  Final   Special Requests   Final    BOTTLES DRAWN AEROBIC AND ANAEROBIC Blood Culture results may not be optimal due to an inadequate volume of blood received in culture bottles   Culture  Setup Time   Final    GRAM POSITIVE COCCI IN CLUSTERS IN BOTH AEROBIC AND ANAEROBIC BOTTLES CRITICAL VALUE NOTED.  VALUE IS CONSISTENT WITH PREVIOUSLY REPORTED AND CALLED VALUE.    Culture (A)  Final    STAPHYLOCOCCUS AUREUS SUSCEPTIBILITIES PERFORMED ON PREVIOUS CULTURE WITHIN THE LAST 5 DAYS. Performed at [facility], [address], [place], [place] [address]    Report Status 01/17/2024 FINAL  Final  Culture, blood (Routine x 2)     Status: Abnormal   Collection Time: 01/14/24  8:23 PM   Specimen: BLOOD RIGHT FOREARM  Result Value Ref Range Status   Specimen Description BLOOD RIGHT FOREARM  Final   Special Requests   Final    BOTTLES DRAWN AEROBIC AND ANAEROBIC Blood Culture adequate volume   Culture  Setup Time    Final    GRAM POSITIVE COCCI IN CLUSTERS IN BOTH AEROBIC AND ANAEROBIC BOTTLES CRITICAL RESULT CALLED TO, READ BACK BY AND VERIFIED WITH: MAYA Feliciano ORN on [id number] @0905  by SM Performed at [facility], [address] [place], [place] [address]    Culture STAPHYLOCOCCUS AUREUS (A)  Final   Report Status 01/17/2024 FINAL  Final   Organism ID, Bacteria STAPHYLOCOCCUS AUREUS  Final      Susceptibility   Staphylococcus aureus - MIC*    CIPROFLOXACIN <=0.5 SENSITIVE Sensitive     ERYTHROMYCIN <=0.25 SENSITIVE Sensitive     GENTAMICIN <=0.5 SENSITIVE Sensitive     OXACILLIN 0.5 SENSITIVE Sensitive     TETRACYCLINE <=1 SENSITIVE Sensitive     VANCOMYCIN  <=0.5 SENSITIVE Sensitive     TRIMETH /SULFA  <=10  SENSITIVE Sensitive     CLINDAMYCIN <=0.25 SENSITIVE Sensitive     RIFAMPIN <=0.5 SENSITIVE Sensitive     Inducible Clindamycin NEGATIVE Sensitive     LINEZOLID 2 SENSITIVE Sensitive     * STAPHYLOCOCCUS AUREUS  Blood Culture ID Panel (Reflexed)     Status: Abnormal   Collection Time: 01/14/24  8:23 PM  Result Value Ref Range Status   Enterococcus faecalis NOT DETECTED NOT DETECTED Final   Enterococcus Faecium NOT DETECTED NOT DETECTED Final   Listeria monocytogenes NOT DETECTED NOT DETECTED Final   Staphylococcus species DETECTED (A) NOT DETECTED Final    Comment: CRITICAL RESULT CALLED TO, READ BACK BY AND VERIFIED WITH: MAYA Feliciano ORN on [id number] @0905  by SM    Staphylococcus aureus (BCID) DETECTED (A) NOT DETECTED Final    Comment: CRITICAL RESULT CALLED TO, READ BACK BY AND VERIFIED WITH: MAYA Feliciano ORN on [id number] @0905  by SM    Staphylococcus epidermidis NOT DETECTED NOT DETECTED Final   Staphylococcus lugdunensis NOT DETECTED NOT DETECTED Final   Streptococcus species NOT DETECTED NOT DETECTED Final   Streptococcus agalactiae NOT DETECTED NOT DETECTED Final   Streptococcus pneumoniae NOT DETECTED NOT DETECTED Final   Streptococcus pyogenes NOT DETECTED NOT DETECTED Final    A.calcoaceticus-baumannii NOT DETECTED NOT DETECTED Final   Bacteroides fragilis NOT DETECTED NOT DETECTED Final   Enterobacterales NOT DETECTED NOT DETECTED Final   Enterobacter cloacae complex NOT DETECTED NOT DETECTED Final   Escherichia coli NOT DETECTED NOT DETECTED Final   Klebsiella aerogenes NOT DETECTED NOT DETECTED Final   Klebsiella oxytoca NOT DETECTED NOT DETECTED Final   Klebsiella pneumoniae NOT DETECTED NOT DETECTED Final   Proteus species NOT DETECTED NOT DETECTED Final   Salmonella species NOT DETECTED NOT DETECTED Final   Serratia marcescens NOT DETECTED NOT DETECTED Final   Haemophilus influenzae NOT DETECTED NOT DETECTED Final   Neisseria meningitidis NOT DETECTED NOT DETECTED Final   Pseudomonas aeruginosa NOT DETECTED NOT DETECTED Final   Stenotrophomonas maltophilia NOT DETECTED NOT DETECTED Final   Candida albicans NOT DETECTED NOT DETECTED Final   Candida auris NOT DETECTED NOT DETECTED Final   Candida glabrata NOT DETECTED NOT DETECTED Final   Candida krusei NOT DETECTED NOT DETECTED Final   Candida parapsilosis NOT DETECTED NOT DETECTED Final   Candida tropicalis NOT DETECTED NOT DETECTED Final   Cryptococcus neoformans/gattii NOT DETECTED NOT DETECTED Final   Meth resistant mecA/C and MREJ NOT DETECTED NOT DETECTED Final    Comment: Performed at [facility], [address], [place], [place] [address]  Resp panel by RT-PCR (RSV, Flu A&B, Covid) Anterior Nasal Swab     Status: None   Collection Time: 01/14/24  9:30 PM   Specimen: Anterior Nasal Swab  Result Value Ref Range Status   SARS Coronavirus 2 by RT PCR NEGATIVE NEGATIVE Final   Influenza A by PCR NEGATIVE NEGATIVE Final   Influenza B by PCR NEGATIVE NEGATIVE Final    Comment: (NOTE) The Xpert Xpress SARS-CoV-2/FLU/RSV plus assay is intended as an aid in the diagnosis of influenza from Nasopharyngeal swab specimens and should not be used as a sole basis for treatment. Nasal washings  and aspirates are unacceptable for Xpert Xpress SARS-CoV-2/FLU/RSV testing.  Fact Sheet for Patients: BloggerCourse.com  Fact Sheet for Healthcare Providers: SeriousBroker.it  This test is not yet approved or cleared by the [place]  FDA and has been authorized for detection and/or diagnosis of SARS-CoV-2 by FDA under an Emergency Use Authorization (EUA).  This EUA will remain in effect (meaning this test can be used) for the duration of the COVID-19 declaration under Section 564(b)(1) of the Act, 21 U.S.C. section 360bbb-3(b)(1), unless the authorization is terminated or revoked.     Resp Syncytial Virus by PCR NEGATIVE NEGATIVE Final    Comment: (NOTE) Fact Sheet for Patients: BloggerCourse.com  Fact Sheet for Healthcare Providers: SeriousBroker.it  This test is not yet approved or cleared by the [place]  FDA and has been authorized for detection and/or diagnosis of SARS-CoV-2 by FDA under an Emergency Use Authorization (EUA). This EUA will remain in effect (meaning this test can be used) for the duration of the COVID-19 declaration under Section 564(b)(1) of the Act, 21 U.S.C. section 360bbb-3(b)(1), unless the authorization is terminated or revoked.  Performed at [facility], [address], [place], [place] [address]   MRSA Next Gen by PCR, Nasal     Status: None   Collection Time: 01/15/24  1:01 AM   Specimen: Nasal Mucosa; Nasal Swab  Result Value Ref Range Status   MRSA by PCR Next Gen NOT DETECTED NOT DETECTED Final    Comment: (NOTE) The GeneXpert MRSA Assay (FDA approved for NASAL specimens only), is one component of a comprehensive MRSA colonization surveillance program. It is not intended to diagnose MRSA infection nor to guide or monitor treatment for MRSA infections. Test performance is not FDA approved in patients less than 46  years old. Performed at [facility], [address], [place], [place] [address]   Culture, blood (Routine X 2) w Reflex to ID Panel     Status: None   Collection Time: 01/15/24  5:33 PM   Specimen: BLOOD RIGHT HAND  Result Value Ref Range Status   Specimen Description BLOOD RIGHT HAND  Final   Special Requests   Final    BOTTLES DRAWN AEROBIC ONLY Blood Culture results may not be optimal due to an inadequate volume of blood received in culture bottles   Culture   Final    NO GROWTH 5 DAYS Performed at [facility], [address], [place], [place] [address]    Report Status 01/20/2024 FINAL  Final  Culture, blood (Routine X 2) w Reflex to ID Panel     Status: None   Collection Time: 01/15/24  5:33 PM   Specimen: BLOOD RIGHT HAND  Result Value Ref Range Status   Specimen Description BLOOD RIGHT HAND  Final   Special Requests   Final    BOTTLES DRAWN AEROBIC ONLY Blood Culture results may not be optimal due to an inadequate volume of blood received in culture bottles   Culture   Final    NO GROWTH 5 DAYS Performed at [facility], [address], [place], [place] [address]    Report Status 01/20/2024 FINAL  Final  Culture, blood (Routine X 2) w Reflex to ID Panel     Status: None (Preliminary result)   Collection Time: 01/20/24 11:07 AM   Specimen: BLOOD RIGHT HAND  Result Value Ref Range Status   Specimen Description BLOOD RIGHT HAND  Final   Special Requests   Final    BOTTLES DRAWN AEROBIC ONLY Blood Culture results may not be optimal due to an inadequate volume of blood received in culture bottles   Culture   Final    NO GROWTH 3 DAYS Performed at [facility], [address] [place], [place] [address]    Report Status  PENDING  Incomplete  Culture, blood (Routine X 2) w Reflex to ID Panel     Status: None (Preliminary result)   Collection Time: 01/20/24 11:07 AM   Specimen: BLOOD RIGHT HAND  Result Value Ref Range Status   Specimen Description  BLOOD RIGHT HAND  Final   Special Requests   Final    BOTTLES DRAWN AEROBIC ONLY Blood Culture results may not be optimal due to an inadequate volume of blood received in culture bottles   Culture   Final    NO GROWTH 3 DAYS Performed at [facility], [address], [place], [place] [address]    Report Status PENDING  Incomplete   Labs: CBC: Recent Labs  Lab 01/17/24 0220 01/18/24 0454 01/19/24 0426 01/20/24 0450 01/21/24 0613 01/22/24 0505 01/23/24 0306  WBC 14.1* 15.0* 16.3* 14.1* 15.0* 16.8* 17.6*  NEUTROABS 11.5* 11.6* 12.2* 10.1*  --   --   --   HGB 7.2* 7.8* 7.6* 7.5* 7.7* 7.8* 7.7*  HCT 23.4* 26.3* 24.9* 24.5* 24.6* 24.8* 24.7*  MCV 92.1 93.3 91.9 92.1 90.4 90.8 90.8  PLT 170 169 180 191 225 261 281   Basic Metabolic Panel: Recent Labs  Lab 01/19/24 0426 01/20/24 0450 01/21/24 0613 01/22/24 0505 01/23/24 0306  NA 135 134* 138 133* 136  K 5.1 5.4* 4.5 4.9 4.1  CL 97* 94* 93* 94* 94*  CO2 22 24 25 23 23   GLUCOSE 85 90 88 95 116*  BUN 62* 67* 40* 46* 26*  CREATININE 6.40* 6.86* 4.57* 5.56* 3.96*  CALCIUM  8.5* 9.0 9.0 9.0 9.0  MG  --   --  1.9 2.1 1.9  PHOS 7.3* 8.0* 5.4* 6.8* 4.8*   Liver Function Tests: Recent Labs  Lab 01/19/24 0426 01/20/24 0450 01/21/24 0613 01/22/24 0505 01/23/24 0306  ALBUMIN  2.1* 2.0* 2.0* 2.0* 2.0*   CBG: Recent Labs  Lab 01/22/24 1701 01/23/24 0027 01/23/24 0623 01/23/24 1134 01/23/24 1612  GLUCAP 108* 137* 89 100* 96    Discharge time spent: greater than 30 minutes.  Author: Yetta Blanch, MD  [facility]

## 2024-01-24 NOTE — Discharge Planning (Signed)
 [facility] Patient Discharge Orders- [facility]: [place]  Patient's name: Calvin Schultz Admit/DC Dates: 01/14/2024 - 01/23/2024  Discharge Diagnoses: Septic shock/MSSA bacteremia. Continue course of cefazolin    Outpatient Dialysis Orders:  -Heparin : None -EDW No change  -[place]: No change  Anemia Aranesp : Given: N  Date of last dose/amount: --   PRBC's Given: -- Date/# of units: -- ESA dose for discharge: Mircera 150 mcg IV q 2 weeks   Recent Labs  Lab 01/23/24 0306  HGB 7.7*  K 4.1  CALCIUM  9.0  PHOS 4.8*  ALBUMIN  2.0*    Access intervention/Change: none   Medications: -IV Antibiotics:  Cefazolin  2g IV q HD until 02/01/24 for bacteremia   OTHER/APPTS/LABS    Completed by: Maisie Ronnald Acosta PA-C   D/C Meds to be reconciled by nurse after every discharge.    Reviewed by: MD:______ RN_______

## 2024-01-25 ENCOUNTER — Telehealth: Payer: Self-pay | Admitting: Nephrology

## 2024-01-25 LAB — CULTURE, BLOOD (ROUTINE X 2)
Culture: NO GROWTH
Culture: NO GROWTH

## 2024-01-25 NOTE — Progress Notes (Addendum)
 Late Note Entry- Sept 22, 2025  Pt was d/c on Saturday. Contacted [facility] this morning to be advised of pt's d/c date and that pt should resume care today. Renal PA completed orders including need for iv abx with HD and sent to clinic.   Randine Mungo Dialysis Navigator [phone]

## 2024-01-25 NOTE — Telephone Encounter (Signed)
 Transition of care contact from inpatient facility  Date of Discharge: 01/23/24 Date of Contact: 01/25/24 Method of contact: Phone  Attempted to contact patient to discuss transition of care from inpatient admission. Patient did not answer the phone. Will continue outreach attempts.

## 2024-02-02 ENCOUNTER — Telehealth: Payer: Self-pay | Admitting: Cardiology

## 2024-02-02 NOTE — Telephone Encounter (Signed)
 Pt is calling because he said [employer] has been faxed us  to try to get pt a portable o2 machine but has not heard back from us .He wants us  to call them at  [phone]

## 2024-02-03 NOTE — Telephone Encounter (Signed)
 Please have him call his PCP or pulmonary medicine.   Tyeshia Cornforth [place], DO, FACC

## 2024-02-04 ENCOUNTER — Encounter

## 2024-02-04 ENCOUNTER — Encounter (HOSPITAL_COMMUNITY)

## 2024-02-08 ENCOUNTER — Inpatient Hospital Stay: Payer: Self-pay | Admitting: Internal Medicine

## 2024-02-17 ENCOUNTER — Encounter

## 2024-02-17 ENCOUNTER — Encounter (HOSPITAL_COMMUNITY)

## 2024-02-18 ENCOUNTER — Ambulatory Visit

## 2024-02-18 ENCOUNTER — Ambulatory Visit (HOSPITAL_COMMUNITY)

## 2024-02-18 ENCOUNTER — Inpatient Hospital Stay: Payer: Self-pay | Admitting: Internal Medicine

## 2024-03-01 ENCOUNTER — Ambulatory Visit: Admitting: Internal Medicine

## 2024-03-01 ENCOUNTER — Encounter: Payer: Self-pay | Admitting: Internal Medicine

## 2024-03-01 ENCOUNTER — Other Ambulatory Visit: Payer: Self-pay

## 2024-03-01 VITALS — BP 116/62 | HR 79 | Temp 98.0°F

## 2024-03-01 DIAGNOSIS — R7881 Bacteremia: Secondary | ICD-10-CM

## 2024-03-01 NOTE — Progress Notes (Signed)
 Patient: Calvin Schultz  DOB: 1950-06-12 MRN: [id number] PCP: Jesus Elberta Gainer, FNP    Chief Complaint  Patient presents with   Follow-up     Patient Active Problem List   Diagnosis Date Noted   MSSA bacteremia 01/18/2024   Septic shock (HCC) 01/15/2024   ESRD on hemodialysis (HCC) 01/15/2024   Chronic kidney disease (CKD), stage V (HCC) 10/02/2023   Anasarca 10/01/2023   Malnutrition of moderate degree 08/17/2023   Longstanding persistent atrial fibrillation (HCC) 08/14/2023   Typical atrial flutter (HCC) 08/14/2023   Acute blood loss anemia 08/14/2023   Gross hematuria 08/14/2023   Hypokalemia 08/11/2023   Hypomagnesemia 08/11/2023   Depression with anxiety 08/10/2023   Acute cystitis with hematuria 08/07/2023   C. difficile diarrhea 08/07/2023   Right ureteral calculus 08/05/2023   Pressure injury of skin 08/05/2023   Leukocytosis 08/05/2023   History of Clostridium difficile colitis 08/05/2023   Controlled diabetes mellitus type 2 with complications (HCC) 08/05/2023   Debility 07/10/2023   Clostridium difficile colitis 07/03/2023   Acute renal failure superimposed on chronic kidney disease 06/26/2023   Elevated PSA 05/31/2023   Nephrolithiasis 05/31/2023   Bilateral lower extremity edema 05/31/2023   Chronic atrial fibrillation (HCC) 05/31/2023   Normocytic anemia 05/31/2023   Metabolic acidosis 05/31/2023   Abnormal urinalysis 05/31/2023   Proteinuria 05/31/2023   Acute kidney injury superimposed on chronic kidney disease 05/30/2023     Subjective:  Calvin Schultz is a 73 y.o.  m with  pertinent history of ESRD on HD via right IJ, diabetes type 2, chronic A-fib, diastolic heart failure, hyperal anemia presents for hospital follow-up MSSA bacteremia.  Patient has a history of lower extremity venous ulcers, ESRD on HD via right IJ.  HD line out on 9/15 repeat blood cultures likely on 9/12 and HD catheter on 9/17.  TTE without vegetation.  Discussed with  cardiology TEE without vegetation as well.  Plan was to cefazolin  x 2 weeks after line removal EOT 9/28. Today doing well no new complaints.  Review of Systems  All other systems reviewed and are negative.   Past Medical History:  Diagnosis Date   Arthritis    Atrial fibrillation (HCC)    CKD (chronic kidney disease) stage 3, GFR 30-59 ml/min (HCC)    Diabetes mellitus without complication (HCC)    Dysrhythmia    Hyperlipidemia    Hypertension     Outpatient Medications Prior to Visit  Medication Sig Dispense Refill   acetaminophen  (TYLENOL ) 325 MG tablet Take 2 tablets (650 mg total) by mouth every 4 (four) hours as needed for mild pain (pain score 1-3) or fever.     gabapentin  (NEURONTIN ) 100 MG capsule Take 100 mg by mouth at bedtime.     iron  polysaccharides (NIFEREX) 150 MG capsule Take 1 capsule (150 mg total) by mouth daily.     LANTUS SOLOSTAR 100 UNIT/ML Solostar Pen Inject 3-4 Units into the skin daily as needed (for hyperglycemia).     midodrine  (PROAMATINE ) 10 MG tablet Take 1 tablet (10 mg total) by mouth 3 (three) times daily with meals. 90 tablet 0   multivitamin (RENA-VIT) TABS tablet Take 1 tablet by mouth at bedtime.     Nystatin (GERHARDT'S BUTT CREAM) CREA Apply 1 Application topically 2 (two) times daily. 60 each 0   silver  sulfADIAZINE  (SILVADENE ) 1 % cream Apply 1 Application topically daily. Apply to legs     tamsulosin  (FLOMAX ) 0.4 MG CAPS capsule Take 1 capsule (  0.4 mg total) by mouth daily after supper. 30 capsule 0   tiZANidine  (ZANAFLEX ) 4 MG tablet Take 1 tablet (4 mg total) by mouth 2 (two) times daily as needed for muscle spasms.     traZODone  (DESYREL ) 50 MG tablet Take 1 tablet (50 mg total) by mouth at bedtime as needed for sleep. 10 tablet 0   docusate sodium  (COLACE) 100 MG capsule Take 1 capsule (100 mg total) by mouth 2 (two) times daily as needed for mild constipation. (Patient not taking: Reported on 03/01/2024) 10 capsule 0   Nutritional  Supplements (,FEEDING SUPPLEMENT, PROSOURCE PLUS) liquid Take 30 mLs by mouth 2 (two) times daily between meals. (Patient not taking: Reported on 03/01/2024) 887 mL 0   Nutritional Supplements (FEEDING SUPPLEMENT, NEPRO CARB STEADY,) LIQD Take 237 mLs by mouth 3 (three) times daily between meals. (Patient not taking: Reported on 03/01/2024) 10000 mL 0   oxyCODONE -acetaminophen  (PERCOCET) 10-325 MG tablet Take 1 tablet by mouth every 6 (six) hours as needed for pain. (Patient not taking: Reported on 03/01/2024) 20 tablet 0   polyethylene glycol (MIRALAX  / GLYCOLAX ) 17 g packet Take 17 g by mouth daily. (Patient not taking: Reported on 03/01/2024) 14 each 0   sevelamer  carbonate (RENVELA ) 800 MG tablet Take 2 tablets (1,600 mg total) by mouth 3 (three) times daily with meals. 30 tablet 0   No facility-administered medications prior to visit.     No Known Allergies  Social History   Tobacco Use   Smoking status: Never   Smokeless tobacco: Never  Vaping Use   Vaping status: Never Used  Substance Use Topics   Alcohol  use: Never   Drug use: Never    Family History  Problem Relation Age of Onset   Dementia Mother     Objective:   Vitals:   03/01/24 1417  BP: 116/62  Pulse: 79  Temp: 98 F (36.7 C)  TempSrc: Temporal  SpO2: 94%   There is no height or weight on file to calculate BMI.  Physical Exam Constitutional:      General: He is not in acute distress.    Appearance: He is normal weight. He is not toxic-appearing.  HENT:     Head: Normocephalic and atraumatic.     Right Ear: External ear normal.     Left Ear: External ear normal.     Nose: No congestion or rhinorrhea.     Mouth/Throat:     Mouth: Mucous membranes are moist.     Pharynx: Oropharynx is clear.  Eyes:     Extraocular Movements: Extraocular movements intact.     Conjunctiva/sclera: Conjunctivae normal.     Pupils: Pupils are equal, round, and reactive to light.  Cardiovascular:     Rate and Rhythm:  Normal rate and regular rhythm.     Heart sounds: No murmur heard.    No friction rub. No gallop.  Pulmonary:     Effort: Pulmonary effort is normal.     Breath sounds: Normal breath sounds.  Abdominal:     General: Abdomen is flat. Bowel sounds are normal.     Palpations: Abdomen is soft.  Musculoskeletal:        General: No swelling. Normal range of motion.     Cervical back: Normal range of motion and neck supple.  Skin:    General: Skin is warm and dry.  Neurological:     General: No focal deficit present.     Mental Status: He is oriented to  person, place, and time.  Psychiatric:        Mood and Affect: Mood normal.     Lab Results: Lab Results  Component Value Date   WBC 17.6 (H) 01/23/2024   HGB 7.7 (L) 01/23/2024   HCT 24.7 (L) 01/23/2024   MCV 90.8 01/23/2024   PLT 281 01/23/2024    Lab Results  Component Value Date   CREATININE 3.96 (H) 01/23/2024   BUN 26 (H) 01/23/2024   NA 136 01/23/2024   K 4.1 01/23/2024   CL 94 (L) 01/23/2024   CO2 23 01/23/2024    Lab Results  Component Value Date   ALT 11 01/14/2024   AST 20 01/14/2024   ALKPHOS 105 01/14/2024   BILITOT 1.7 (H) 01/14/2024     Assessment & Plan:  #MSSA bacteremia #Lower extremity venous ulcers, followed [facility] #ESRD on HD via right IJ  HD line out on 9/15.  Repeat blood cultures cleared on 9/12.  New HD femoral catheter placed on 9/17.  Completed cefazolin  with hd on 9/28 Blood Cx today off of abx  Follow-up with ID as needed  Loney Stank, MD [facility]   03/01/24  2:26 PM I have personally spent 41 minutes involved in face-to-face and non-face-to-face activities for this patient on the day of the visit. Professional time spent includes the following activities: Preparing to see the patient (review of tests), Obtaining and/or reviewing separately obtained history (admission/discharge record), Performing a medically  appropriate examination and/or evaluation , Ordering medications/tests/procedures, referring and communicating with other health care professionals, Documenting clinical information in the EMR, Independently interpreting results (not separately reported), Communicating results to the patient/family/caregiver, Counseling and educating the patient/family/caregiver and Care coordination (not separately reported).

## 2024-03-06 LAB — CULTURE, BLOOD (SINGLE)
MICRO NUMBER:: 17157997
MICRO NUMBER:: 17158007

## 2024-03-24 ENCOUNTER — Ambulatory Visit (HOSPITAL_COMMUNITY)

## 2024-03-24 ENCOUNTER — Ambulatory Visit

## 2024-04-07 ENCOUNTER — Ambulatory Visit

## 2024-04-07 ENCOUNTER — Ambulatory Visit (HOSPITAL_COMMUNITY)

## 2024-04-18 ENCOUNTER — Telehealth: Payer: Self-pay | Admitting: Cardiology

## 2024-04-18 NOTE — Telephone Encounter (Signed)
°  Patient is asking if Dr Michele could order him  portable O2 with extended battery life

## 2024-04-18 NOTE — Telephone Encounter (Signed)
 Patient is returning phone call.

## 2024-04-18 NOTE — Telephone Encounter (Signed)
 Left message to call office.

## 2024-04-21 NOTE — Progress Notes (Addendum)
 [facility]  Referred by: Jesus Elberta Gainer, NP  Chief Complaint: Wound  PMH Includes: - DM2 - CKD - Obesity - HTN - Atrial fibrillation  - HLD - Malnutrition   Initial History 12/29/23:  Here for evaluation of both legs ulcerations and R foot ulcerations. He was sick and admitted for c. Diff. He developed these wounds a few months ago. They started from his swelling and trauma. He denies fevers or chills. He has some pain mostly at nigh that is burning. The pain is not significant. He notes the pain is mostly a night. Denies any intense pain or rapid worsening. He notes progress with edema. The swelling is less with dialysis.   Admitted 3-7 through 4-18 due to diarrhea. He was previously treated in January for infected kidney stone and diuresed due to volume overload.  Admitted 5-29 through 10-16-23 due to anasarca. Worsening CKD. Advanced to ESRD. Strated on HD. Required RBCs Saw PCP on 11-12-23 due to wound on R leg from hitting his bed. Also wound on R heel present for a long period of time. Noted to be very swollen. Significant edema.   Relevant work up/results: TTE 10-13-23   A1c 5.9 11-12-23 CBC with hgb 8.7 10-19-23 CMP with albumin  2.5 10-16-23  Interval history/progress: Initial visit - see above  01-07-24 Wound on R leg appear slightly larger. Still eschar.  Left leg stable. Right foot stable He is feeling well. Denies concerns.   02-09-24 Here for follow up. 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  He was admitted 9-11 through 01-23-24. Reviewed significant records. Admitted due to septic shock. Wounds with calciphylaxis. Blood cultures with MSSA. No evidence of vegetation. XR were negative for osteomyelitis. He reports still receiving abx at dialysis. He is also receiving STS. He is still having pain.   ABI 01-14-24   Venous:     02-23-24 Wounds are progressing. The eschar  is loosening.  He still has not heard from pain management. That is his major concern.  New wound on left foot in similar place to R foot. R foot is healthier. He is still wearing regular tennis shoes today to visit. He reports using offloading boots regularly at home No systemic symptoms of fevers, chills, nausea or vomiting.   03-08-24 Wounds are showing some progress No concerns.   03-22-24 Wounds are healing up slowly.  Homehealth nurse stopped coming about 2 weeks ago unclear why Noted some redness on R foot No fevers or chills.  Not using offloading boots during the day  04-07-24 Wounds appear stable. Did not receive xeroform, has been using SSD only.   04-21-24 Wounds are improving on legs. He is more swollen today. He notes he gained some fluid they are trying to remove at dialysis. Denies fevers or chills.    Vitals:   04/21/24 1046  BP: (!) 95/56  Pulse: (!) 45  Resp: 16  Temp: 97.4 F (36.3 C)     Exam: Pt is AA in NAD. Oriented Wound(s) exam: Right leg: Large ulcerations on the leg. Eschar is loosening with granulation and buds appearing through. No infectious signs.   Right heel:  New wound on plantar heel. Eschar present.  Posterior heel is showing some progress with granulation and new skin island  5th MTB worse. There is an abscess present. Used a scalpel to open and expressed purulence. Culture taken. Used q tip to probe and  remove any loculations. Excised wound. Close to bone.    Left leg: eschar is loosening. Excised part of wound. Tolerated well.    Left 5th MTB with loosening eschar. No infectious signs.   Wound 12/15/23 Leg Right;Lower;Lateral (Active)  Date First Assessed/Time First Assessed: 12/15/23 0920   Pre-Existing Wound: Yes  Location: Leg  Wound Location Orientation: Right;Lower;Lateral    Assessments 12/15/2023  9:49 AM 04/21/2024 10:43 AM  Wound Image      Site Assessment Necrotic tissue, eschar Slough;Pink;Necrotic tissue, eschar   Peri-Wound Assessment Clean;Dry Clean;Intact;Dry  Drainage Amount Moderate Moderate  Drainage Description Serosanguineous Serosanguineous;Yellow  Wound Odor None None  Treatments -- Cleansed  Wound Length (cm) 17.5 cm 22 cm  Wound Width (cm) 8.5 cm 15 cm  Wound Surface Area (cm^2) 116.83 cm^2 259.18 cm^2  Wound Depth (cm) 0.2 cm 0.3 cm  Wound Volume (cm^3) 15.577 cm^3 51.836 cm^3  Wound Healing % -- -233  Non-staged Wound Description Full thickness --     Active Orders  Date Order Priority Status Authorizing Provider  04/21/24 1121 Wound Care Routine Active Fonda Charlie Morita, MD    - Wound cleansing::    Other (Specify) (soap and water )    - Dressing change frequency::    Change dressing every day    - Primary wound dressing::    Other (Specify) (ssd cream ,xeroform,vashe moistened 1/4 packing strip)    - Secondary wound dressing::    Other (Specify) (dry dressing)    - Additional Orders/Instructions::    Follow nutritous diet    - Follow Up Appointment::    Return to clinic in 1 week  04/07/24 1251 Wound Care Routine Active Lucie Almarie Engels, DO    - Secondary wound dressing::    ABD pad    - Secure with::    Roll gauze    - Additional Orders/Instructions::    Follow nutritous diet    - Follow Up Appointment::    Return to clinic in 2 weeks  03/22/24 1150 Wound Care Routine Active Fonda Charlie Morita, MD    - Dressing change frequency::    Change dressing every day    - Secondary wound dressing::    ABD pad    - Secure with::    Roll gauze    - Additional Orders/Instructions::    Follow nutritous diet    - Follow Up Appointment::    Return to clinic in 1 week  02/09/24 1118 Wound Care Routine Active Fonda Charlie Morita, MD    - Wound cleansing::    Other (Specify) (soap and water )    - Dressing change frequency::    Change dressing every day    - Primary wound dressing::    Other (Specify) (Silvadene  cream,Aquacel)    - Secondary wound dressing::    Other  (Specify) (dry dressing)    - Additional Orders/Instructions::    Follow nutritous diet    - Follow Up Appointment::    Return to clinic in 2 weeks  01/28/24 1022 Wound Care Routine Active Jerilynn Cheryl Pouch, MD    - Wound cleansing::    Other (Specify) (soap and water )    - Dressing change frequency::    Change dressing every day    - Primary wound dressing::    Other (Specify) (Aquacel,Silvadene  cream)    - Secondary wound dressing::    Other (Specify) (dry dressing)    - Additional Orders/Instructions::    Follow nutritous diet    - Follow Up Appointment::  Return to clinic in 2 weeks  12/29/23 1002 Wound Care Routine Active Fonda Charlie Morita, MD    - Dressing change frequency::    Change dressing every day    - Secondary wound dressing::    ABD pad    - Additional Orders/Instructions::    Follow nutritous diet    - Follow Up Appointment::    Return to clinic in 1 week  12/22/23 0934 Wound Care Routine Active Gwendlyn Kimball Norfolk, MD    - Dressing change frequency::    Change dressing every day    - Secondary wound dressing::    ABD pad    - Additional Orders/Instructions::    Follow nutritous diet    - Follow Up Appointment::    Return to clinic in 1 week  12/15/23 1022 Wound Care Routine Active Gwendlyn Kimball [place], MD    - Wound cleansing::    Other (Specify) (soap and water )    - Dressing change frequency::    Change dressing every day    - Primary wound dressing::    Other (Specify) (Silvadene  cream,Aquacel)    - Secondary wound dressing::    Other (Specify) (dry dressing)    - Additional Orders/Instructions::    Follow nutritous diet    - Follow Up Appointment::    Return to clinic in 1 week     Inactive Orders  Date Order Priority Status Authorizing Provider  03/22/24 1113 Debridement Right;Lower;Lateral Leg Routine Completed Fonda Charlie Morita, MD  02/23/24 1221 Wound Care Routine Discontinued Fonda Charlie Morita, MD    - Dressing change frequency::     Change dressing every day    - Secondary wound dressing::    ABD pad    - Secure with::    Roll gauze    - Additional Orders/Instructions::    Follow nutritous diet    - Follow Up Appointment::    Return to clinic in 2 weeks  02/23/24 1050 Debridement Right;Lower;Lateral Leg Routine Completed Fonda Charlie Morita, MD  02/09/24 1102 Debridement Right;Lower;Lateral Leg Routine Completed Fonda Charlie Morita, MD  01/12/24 1030 Wound Care Routine Discontinued Curtistine Rush Recardo Raddle., MD    - Dressing change frequency::    Change dressing every day    - Secondary wound dressing::    ABD pad    - Additional Orders/Instructions::    Follow nutritous diet    - Follow Up Appointment::    Return to clinic in 1 week  01/07/24 1541 Wound Care Routine Discontinued Fonda Charlie Morita, MD    - Dressing change frequency::    Change dressing every day    - Secondary wound dressing::    ABD pad    - Secure with::    Roll gauze    - Secure with::    Tape    - Additional Orders/Instructions::    Follow nutritous diet    - Follow Up Appointment::    Return to clinic in 1 week  12/29/23 0931 Debridement Right;Lower;Lateral Leg Routine Completed Fonda Charlie Morita, MD     Wound 12/15/23 Heel Right (Active)  Date First Assessed/Time First Assessed: 12/15/23 0947   Pre-Existing Wound: Yes  Location: Heel  Wound Location Orientation: Right    Assessments 12/15/2023  9:50 AM 04/21/2024 10:44 AM  Wound Image      Site Assessment Red;Slough Slough;Red  Peri-Wound Assessment Clean;Dry Clean;Dry;Intact  Drainage Amount Moderate Moderate  Drainage Description Serosanguineous Serosanguineous;Yellow  Wound Odor None None  Treatments -- Cleansed  Wound  Length (cm) 1.5 cm 2 cm  Wound Width (cm) 1.5 cm 1.5 cm  Wound Surface Area (cm^2) -- 2.36 cm^2  Wound Depth (cm) 0.1 cm 0.1 cm  Wound Volume (cm^3) 0.118 cm^3 0.157 cm^3  Wound Healing % -- -33  Non-staged Wound Description Full thickness --     Active  Orders  Date Order Priority Status Authorizing Provider  04/21/24 1121 Wound Care Routine Active Fonda Charlie Morita, MD    - Wound cleansing::    Other (Specify) (soap and water )    - Dressing change frequency::    Change dressing every day    - Primary wound dressing::    Other (Specify) (ssd cream ,xeroform,vashe moistened 1/4 packing strip)    - Secondary wound dressing::    Other (Specify) (dry dressing)    - Additional Orders/Instructions::    Follow nutritous diet    - Follow Up Appointment::    Return to clinic in 1 week  04/07/24 1251 Wound Care Routine Active Lucie Almarie Engels, DO    - Secondary wound dressing::    ABD pad    - Secure with::    Roll gauze    - Additional Orders/Instructions::    Follow nutritous diet    - Follow Up Appointment::    Return to clinic in 2 weeks  03/22/24 1150 Wound Care Routine Active Fonda Charlie Morita, MD    - Dressing change frequency::    Change dressing every day    - Secondary wound dressing::    ABD pad    - Secure with::    Roll gauze    - Additional Orders/Instructions::    Follow nutritous diet    - Follow Up Appointment::    Return to clinic in 1 week  02/09/24 1118 Wound Care Routine Active Fonda Charlie Morita, MD    - Wound cleansing::    Other (Specify) (soap and water )    - Dressing change frequency::    Change dressing every day    - Primary wound dressing::    Other (Specify) (Silvadene  cream,Aquacel)    - Secondary wound dressing::    Other (Specify) (dry dressing)    - Additional Orders/Instructions::    Follow nutritous diet    - Follow Up Appointment::    Return to clinic in 2 weeks  01/28/24 1022 Wound Care Routine Active Jerilynn Cheryl Pouch, MD    - Wound cleansing::    Other (Specify) (soap and water )    - Dressing change frequency::    Change dressing every day    - Primary wound dressing::    Other (Specify) (Aquacel,Silvadene  cream)    - Secondary wound dressing::    Other (Specify) (dry dressing)     - Additional Orders/Instructions::    Follow nutritous diet    - Follow Up Appointment::    Return to clinic in 2 weeks  12/29/23 1002 Wound Care Routine Active Fonda Charlie Morita, MD    - Dressing change frequency::    Change dressing every day    - Secondary wound dressing::    ABD pad    - Additional Orders/Instructions::    Follow nutritous diet    - Follow Up Appointment::    Return to clinic in 1 week  12/22/23 0934 Wound Care Routine Active Gwendlyn Kimball [place], MD    - Dressing change frequency::    Change dressing every day    - Secondary wound dressing::    ABD pad    - Additional  Orders/Instructions::    Follow nutritous diet    - Follow Up Appointment::    Return to clinic in 1 week  12/15/23 1022 Wound Care Routine Active Gwendlyn Kimball [place], MD    - Wound cleansing::    Other (Specify) (soap and water )    - Dressing change frequency::    Change dressing every day    - Primary wound dressing::    Other (Specify) (Silvadene  cream,Aquacel)    - Secondary wound dressing::    Other (Specify) (dry dressing)    - Additional Orders/Instructions::    Follow nutritous diet    - Follow Up Appointment::    Return to clinic in 1 week     Inactive Orders  Date Order Priority Status Authorizing Provider  02/23/24 1221 Wound Care Routine Discontinued Fonda Charlie Morita, MD    - Dressing change frequency::    Change dressing every day    - Secondary wound dressing::    ABD pad    - Secure with::    Roll gauze    - Additional Orders/Instructions::    Follow nutritous diet    - Follow Up Appointment::    Return to clinic in 2 weeks  01/12/24 1030 Wound Care Routine Discontinued Curtistine Rush Recardo Raddle., MD    - Dressing change frequency::    Change dressing every day    - Secondary wound dressing::    ABD pad    - Additional Orders/Instructions::    Follow nutritous diet    - Follow Up Appointment::    Return to clinic in 1 week  01/12/24 1013 Debridement Right Heel Routine  Completed Curtistine Rush Recardo Raddle., MD  01/07/24 1541 Wound Care Routine Discontinued Fonda Charlie Morita, MD    - Dressing change frequency::    Change dressing every day    - Secondary wound dressing::    ABD pad    - Secure with::    Roll gauze    - Secure with::    Tape    - Additional Orders/Instructions::    Follow nutritous diet    - Follow Up Appointment::    Return to clinic in 1 week  12/22/23 0929 Debridement Right Heel Routine Completed Gwendlyn Kimball [place], MD  12/15/23 1008 Debridement Right Heel Routine Completed Gwendlyn Kimball [place], MD     Wound 12/15/23 Plantar Right;Lateral (Active)  Date First Assessed/Time First Assessed: 12/15/23 0948   Pre-Existing Wound: Yes  Location: Plantar  Wound Location Orientation: Right;Lateral    Assessments 12/15/2023  9:50 AM 04/21/2024 10:44 AM  Wound Image      Site Assessment Necrotic tissue, eschar Red;Slough  Peri-Wound Assessment Clean;Dry Clean;Dry;Intact  Drainage Amount None Moderate  Drainage Description -- Serosanguineous;Yellow  Wound Odor Moderate None  Wound Length (cm) 0.4 cm 1 cm  Wound Width (cm) 0.4 cm 1 cm  Wound Surface Area (cm^2) 0.13 cm^2 0.79 cm^2  Wound Depth (cm) 0.1 cm 0.1 cm  Wound Volume (cm^3) 0.008 cm^3 0.052 cm^3  Wound Healing % -- -550  Non-staged Wound Description -- Full thickness     Active Orders  Date Order Priority Status Authorizing Provider  04/21/24 1121 Wound Care Routine Active Fonda Charlie Morita, MD    - Wound cleansing::    Other (Specify) (soap and water )    - Dressing change frequency::    Change dressing every day    - Primary wound dressing::    Other (Specify) (ssd cream ,xeroform,vashe moistened 1/4 packing strip)    -  Secondary wound dressing::    Other (Specify) (dry dressing)    - Additional Orders/Instructions::    Follow nutritous diet    - Follow Up Appointment::    Return to clinic in 1 week  04/07/24 1251 Wound Care Routine Active Lucie Almarie Engels,  DO    - Secondary wound dressing::    ABD pad    - Secure with::    Roll gauze    - Additional Orders/Instructions::    Follow nutritous diet    - Follow Up Appointment::    Return to clinic in 2 weeks  03/22/24 1150 Wound Care Routine Active Fonda Charlie Morita, MD    - Dressing change frequency::    Change dressing every day    - Secondary wound dressing::    ABD pad    - Secure with::    Roll gauze    - Additional Orders/Instructions::    Follow nutritous diet    - Follow Up Appointment::    Return to clinic in 1 week  02/09/24 1118 Wound Care Routine Active Fonda Charlie Morita, MD    - Wound cleansing::    Other (Specify) (soap and water )    - Dressing change frequency::    Change dressing every day    - Primary wound dressing::    Other (Specify) (Silvadene  cream,Aquacel)    - Secondary wound dressing::    Other (Specify) (dry dressing)    - Additional Orders/Instructions::    Follow nutritous diet    - Follow Up Appointment::    Return to clinic in 2 weeks  01/28/24 1022 Wound Care Routine Active Jerilynn Cheryl Pouch, MD    - Wound cleansing::    Other (Specify) (soap and water )    - Dressing change frequency::    Change dressing every day    - Primary wound dressing::    Other (Specify) (Aquacel,Silvadene  cream)    - Secondary wound dressing::    Other (Specify) (dry dressing)    - Additional Orders/Instructions::    Follow nutritous diet    - Follow Up Appointment::    Return to clinic in 2 weeks  12/29/23 1002 Wound Care Routine Active Fonda Charlie Morita, MD    - Dressing change frequency::    Change dressing every day    - Secondary wound dressing::    ABD pad    - Additional Orders/Instructions::    Follow nutritous diet    - Follow Up Appointment::    Return to clinic in 1 week  12/22/23 0934 Wound Care Routine Active Gwendlyn Kimball [place], MD    - Dressing change frequency::    Change dressing every day    - Secondary wound dressing::    ABD pad    - Additional  Orders/Instructions::    Follow nutritous diet    - Follow Up Appointment::    Return to clinic in 1 week  12/15/23 1022 Wound Care Routine Active Gwendlyn Kimball [place], MD    - Wound cleansing::    Other (Specify) (soap and water )    - Dressing change frequency::    Change dressing every day    - Primary wound dressing::    Other (Specify) (Silvadene  cream,Aquacel)    - Secondary wound dressing::    Other (Specify) (dry dressing)    - Additional Orders/Instructions::    Follow nutritous diet    - Follow Up Appointment::    Return to clinic in 1 week     Inactive Orders  Date Order Priority Status Authorizing Provider  02/23/24 1221 Wound Care Routine Discontinued Fonda Charlie Morita, MD    - Dressing change frequency::    Change dressing every day    - Secondary wound dressing::    ABD pad    - Secure with::    Roll gauze    - Additional Orders/Instructions::    Follow nutritous diet    - Follow Up Appointment::    Return to clinic in 2 weeks  01/12/24 1030 Wound Care Routine Discontinued Curtistine Rush Recardo Raddle., MD    - Dressing change frequency::    Change dressing every day    - Secondary wound dressing::    ABD pad    - Additional Orders/Instructions::    Follow nutritous diet    - Follow Up Appointment::    Return to clinic in 1 week  01/12/24 1012 Debridement Right;Lateral Plantar Routine Completed Curtistine Rush Recardo Raddle., MD  01/07/24 1541 Wound Care Routine Discontinued Fonda Charlie Morita, MD    - Dressing change frequency::    Change dressing every day    - Secondary wound dressing::    ABD pad    - Secure with::    Roll gauze    - Secure with::    Tape    - Additional Orders/Instructions::    Follow nutritous diet    - Follow Up Appointment::    Return to clinic in 1 week     Wound 12/15/23 Leg Left;Posterior (Active)  Date First Assessed/Time First Assessed: 12/15/23 0948   Pre-Existing Wound: Yes  Location: Leg  Wound Location Orientation: Left;Posterior     Assessments 12/15/2023  9:50 AM 04/21/2024 10:37 AM  Wound Image      Site Assessment Slough Necrotic tissue, eschar  Peri-Wound Assessment Clean;Dry Clean;Dry;Intact  Drainage Amount Moderate Small  Drainage Description Serosanguineous Serosanguineous;Yellow  Wound Odor None None  Treatments -- Cleansed  Wound Length (cm) 1.1 cm 15 cm  Wound Width (cm) 1 cm 9.8 cm  Wound Surface Area (cm^2) -- 115.45 cm^2  Wound Depth (cm) 0.2 cm 0.3 cm  Wound Volume (cm^3) 0.115 cm^3 23.091 cm^3  Wound Healing % -- -19979  Non-staged Wound Description Full thickness Full thickness     Active Orders  Date Order Priority Status Authorizing Provider  04/21/24 1121 Wound Care Routine Active Fonda Charlie Morita, MD    - Wound cleansing::    Other (Specify) (soap and water )    - Dressing change frequency::    Change dressing every day    - Primary wound dressing::    Other (Specify) (ssd cream ,xeroform,vashe moistened 1/4 packing strip)    - Secondary wound dressing::    Other (Specify) (dry dressing)    - Additional Orders/Instructions::    Follow nutritous diet    - Follow Up Appointment::    Return to clinic in 1 week  04/07/24 1251 Wound Care Routine Active Lucie Almarie Engels, DO    - Secondary wound dressing::    ABD pad    - Secure with::    Roll gauze    - Additional Orders/Instructions::    Follow nutritous diet    - Follow Up Appointment::    Return to clinic in 2 weeks  03/22/24 1150 Wound Care Routine Active Fonda Charlie Morita, MD    - Dressing change frequency::    Change dressing every day    - Secondary wound dressing::    ABD pad    - Secure with::  Roll gauze    - Additional Orders/Instructions::    Follow nutritous diet    - Follow Up Appointment::    Return to clinic in 1 week  02/09/24 1118 Wound Care Routine Active Fonda Charlie Morita, MD    - Wound cleansing::    Other (Specify) (soap and water )    - Dressing change frequency::    Change dressing every day     - Primary wound dressing::    Other (Specify) (Silvadene  cream,Aquacel)    - Secondary wound dressing::    Other (Specify) (dry dressing)    - Additional Orders/Instructions::    Follow nutritous diet    - Follow Up Appointment::    Return to clinic in 2 weeks  01/28/24 1022 Wound Care Routine Active Jerilynn Cheryl Pouch, MD    - Wound cleansing::    Other (Specify) (soap and water )    - Dressing change frequency::    Change dressing every day    - Primary wound dressing::    Other (Specify) (Aquacel,Silvadene  cream)    - Secondary wound dressing::    Other (Specify) (dry dressing)    - Additional Orders/Instructions::    Follow nutritous diet    - Follow Up Appointment::    Return to clinic in 2 weeks  12/29/23 1002 Wound Care Routine Active Fonda Charlie Morita, MD    - Dressing change frequency::    Change dressing every day    - Secondary wound dressing::    ABD pad    - Additional Orders/Instructions::    Follow nutritous diet    - Follow Up Appointment::    Return to clinic in 1 week  12/22/23 0934 Wound Care Routine Active Gwendlyn Kimball [place], MD    - Dressing change frequency::    Change dressing every day    - Secondary wound dressing::    ABD pad    - Additional Orders/Instructions::    Follow nutritous diet    - Follow Up Appointment::    Return to clinic in 1 week  12/15/23 1022 Wound Care Routine Active Gwendlyn Kimball [place], MD    - Wound cleansing::    Other (Specify) (soap and water )    - Dressing change frequency::    Change dressing every day    - Primary wound dressing::    Other (Specify) (Silvadene  cream,Aquacel)    - Secondary wound dressing::    Other (Specify) (dry dressing)    - Additional Orders/Instructions::    Follow nutritous diet    - Follow Up Appointment::    Return to clinic in 1 week     Inactive Orders  Date Order Priority Status Authorizing Provider  03/22/24 1120 Debridement Left;Posterior Leg Routine Completed Fonda Charlie Morita, MD   02/23/24 1221 Wound Care Routine Discontinued Fonda Charlie Morita, MD    - Dressing change frequency::    Change dressing every day    - Secondary wound dressing::    ABD pad    - Secure with::    Roll gauze    - Additional Orders/Instructions::    Follow nutritous diet    - Follow Up Appointment::    Return to clinic in 2 weeks  01/12/24 1030 Wound Care Routine Discontinued Curtistine Norleen Recardo Mickey., MD    - Dressing change frequency::    Change dressing every day    - Secondary wound dressing::    ABD pad    - Additional Orders/Instructions::    Follow nutritous diet    -  Follow Up Appointment::    Return to clinic in 1 week  01/07/24 1541 Wound Care Routine Discontinued Fonda Charlie Morita, MD    - Dressing change frequency::    Change dressing every day    - Secondary wound dressing::    ABD pad    - Secure with::    Roll gauze    - Secure with::    Tape    - Additional Orders/Instructions::    Follow nutritous diet    - Follow Up Appointment::    Return to clinic in 1 week  12/15/23 1011 Debridement Left;Posterior Tibial Routine Completed Gwendlyn Kimball [place], MD     Wound 01/26/24 Pretibial Left;Lateral (Active)  Date First Assessed/Time First Assessed: 01/26/24 1355   Location: Pretibial  Wound Location Orientation: Left;Lateral    Assessments 01/26/2024  1:56 PM 04/21/2024 10:37 AM  Wound Image      Site Assessment Necrotic tissue, eschar;Necrotic tissue, slough Necrotic tissue, eschar;Slough  Peri-Wound Assessment Clean;Dry Clean;Intact;Dry  Drainage Amount Moderate Small  Drainage Description Serosanguineous Serosanguineous;Yellow  Wound Odor None None  Treatments Cleansed --  Wound Length (cm) 10 cm 15 cm  Wound Width (cm) 5.5 cm 6.8 cm  Wound Surface Area (cm^2) 43.2 cm^2 80.11 cm^2  Wound Depth (cm) 0.1 cm 0.3 cm  Wound Volume (cm^3) 2.88 cm^3 16.022 cm^3  Wound Healing % -- -456  Non-staged Wound Description Full thickness Full thickness     Active Orders   Date Order Priority Status Authorizing Provider  04/21/24 1121 Wound Care Routine Active Fonda Charlie Morita, MD    - Wound cleansing::    Other (Specify) (soap and water )    - Dressing change frequency::    Change dressing every day    - Primary wound dressing::    Other (Specify) (ssd cream ,xeroform,vashe moistened 1/4 packing strip)    - Secondary wound dressing::    Other (Specify) (dry dressing)    - Additional Orders/Instructions::    Follow nutritous diet    - Follow Up Appointment::    Return to clinic in 1 week  04/07/24 1251 Wound Care Routine Active Lucie Almarie Engels, DO    - Secondary wound dressing::    ABD pad    - Secure with::    Roll gauze    - Additional Orders/Instructions::    Follow nutritous diet    - Follow Up Appointment::    Return to clinic in 2 weeks  03/22/24 1150 Wound Care Routine Active Fonda Charlie Morita, MD    - Dressing change frequency::    Change dressing every day    - Secondary wound dressing::    ABD pad    - Secure with::    Roll gauze    - Additional Orders/Instructions::    Follow nutritous diet    - Follow Up Appointment::    Return to clinic in 1 week  02/09/24 1118 Wound Care Routine Active Fonda Charlie Morita, MD    - Wound cleansing::    Other (Specify) (soap and water )    - Dressing change frequency::    Change dressing every day    - Primary wound dressing::    Other (Specify) (Silvadene  cream,Aquacel)    - Secondary wound dressing::    Other (Specify) (dry dressing)    - Additional Orders/Instructions::    Follow nutritous diet    - Follow Up Appointment::    Return to clinic in 2 weeks  01/28/24 1022 Wound Care Routine Active Jerilynn Cheryl Pouch,  MD    - Wound cleansing::    Other (Specify) (soap and water )    - Dressing change frequency::    Change dressing every day    - Primary wound dressing::    Other (Specify) (Aquacel,Silvadene  cream)    - Secondary wound dressing::    Other (Specify) (dry dressing)    -  Additional Orders/Instructions::    Follow nutritous diet    - Follow Up Appointment::    Return to clinic in 2 weeks     Inactive Orders  Date Order Priority Status Authorizing Provider  03/22/24 1116 Debridement Left;Lateral Pretibial Routine Completed Fonda Charlie Morita, MD  02/23/24 1221 Wound Care Routine Discontinued Fonda Charlie Morita, MD    - Dressing change frequency::    Change dressing every day    - Secondary wound dressing::    ABD pad    - Secure with::    Roll gauze    - Additional Orders/Instructions::    Follow nutritous diet    - Follow Up Appointment::    Return to clinic in 2 weeks     Wound 02/23/24 Pressure Injury Foot Anterior;Left (Active)  Date First Assessed/Time First Assessed: 02/23/24 1035   Pre-Existing Wound: Yes  Primary Wound Type: Pressure Injury  Location: Foot  Wound Location Orientation: Anterior;Left    Assessments 02/23/2024 10:35 AM 04/21/2024 10:39 AM  Wound Image      Site Assessment Red;Slough Necrotic tissue, eschar;Slough  Peri-Wound Assessment Clean Clean;Dry;Intact  Pressure Injury Stage -- Unstageable  Drainage Amount Small Small  Drainage Description Serosanguineous Serosanguineous;Yellow  Wound Odor None None  Wound Length (cm) 0.8 cm 1.5 cm  Wound Width (cm) 1 cm 2 cm  Wound Surface Area (cm^2) 0.63 cm^2 2.36 cm^2  Wound Depth (cm) 0.1 cm 0.1 cm  Wound Volume (cm^3) 0.042 cm^3 0.157 cm^3  Wound Healing % -- -274  Non-staged Wound Description Full thickness Full thickness     Active Orders  Date Order Priority Status Authorizing Provider  04/21/24 1121 Wound Care Routine Active Fonda Charlie Morita, MD    - Wound cleansing::    Other (Specify) (soap and water )    - Dressing change frequency::    Change dressing every day    - Primary wound dressing::    Other (Specify) (ssd cream ,xeroform,vashe moistened 1/4 packing strip)    - Secondary wound dressing::    Other (Specify) (dry dressing)    - Additional  Orders/Instructions::    Follow nutritous diet    - Follow Up Appointment::    Return to clinic in 1 week  04/07/24 1251 Wound Care Routine Active Lucie Almarie Engels, DO    - Secondary wound dressing::    ABD pad    - Secure with::    Roll gauze    - Additional Orders/Instructions::    Follow nutritous diet    - Follow Up Appointment::    Return to clinic in 2 weeks  03/22/24 1150 Wound Care Routine Active Fonda Charlie Morita, MD    - Dressing change frequency::    Change dressing every day    - Secondary wound dressing::    ABD pad    - Secure with::    Roll gauze    - Additional Orders/Instructions::    Follow nutritous diet    - Follow Up Appointment::    Return to clinic in 1 week     Inactive Orders  Date Order Priority Status Authorizing Provider  02/23/24 1221 Wound Care Routine  Discontinued Fonda Charlie Morita, MD    - Dressing change frequency::    Change dressing every day    - Secondary wound dressing::    ABD pad    - Secure with::    Roll gauze    - Additional Orders/Instructions::    Follow nutritous diet    - Follow Up Appointment::    Return to clinic in 2 weeks     Wound 02/23/24 Venous Ulcer Pretibial Right;Medial (Active)  Date First Assessed/Time First Assessed: 02/23/24 1035   Pre-Existing Wound: Yes  Primary Wound Type: Venous Ulcer  Location: Pretibial  Wound Location Orientation: Right;Medial    Assessments 02/23/2024 10:36 AM 04/21/2024 10:42 AM  Wound Image      Site Assessment Necrotic tissue, eschar Pink;Slough;Necrotic tissue, eschar  Peri-Wound Assessment Clean Clean;Dry;Intact  Drainage Amount Moderate Moderate  Drainage Description Serosanguineous;Green Serosanguineous;Yellow  Wound Odor None None  Wound Length (cm) 8.4 cm 8 cm  Wound Width (cm) 4.5 cm 8.8 cm  Wound Surface Area (cm^2) 29.69 cm^2 55.29 cm^2  Wound Depth (cm) 0.1 cm 0.3 cm  Wound Volume (cm^3) 1.979 cm^3 11.058 cm^3  Wound Healing % -- -459  Non-staged Wound  Description Full thickness Full thickness     Active Orders  Date Order Priority Status Authorizing Provider  04/21/24 1121 Wound Care Routine Active Fonda Charlie Morita, MD    - Wound cleansing::    Other (Specify) (soap and water )    - Dressing change frequency::    Change dressing every day    - Primary wound dressing::    Other (Specify) (ssd cream ,xeroform,vashe moistened 1/4 packing strip)    - Secondary wound dressing::    Other (Specify) (dry dressing)    - Additional Orders/Instructions::    Follow nutritous diet    - Follow Up Appointment::    Return to clinic in 1 week  04/07/24 1251 Wound Care Routine Active Lucie Almarie Engels, DO    - Secondary wound dressing::    ABD pad    - Secure with::    Roll gauze    - Additional Orders/Instructions::    Follow nutritous diet    - Follow Up Appointment::    Return to clinic in 2 weeks  03/22/24 1150 Wound Care Routine Active Fonda Charlie Morita, MD    - Dressing change frequency::    Change dressing every day    - Secondary wound dressing::    ABD pad    - Secure with::    Roll gauze    - Additional Orders/Instructions::    Follow nutritous diet    - Follow Up Appointment::    Return to clinic in 1 week     Inactive Orders  Date Order Priority Status Authorizing Provider  02/23/24 1221 Wound Care Routine Discontinued Fonda Charlie Morita, MD    - Dressing change frequency::    Change dressing every day    - Secondary wound dressing::    ABD pad    - Secure with::    Roll gauze    - Additional Orders/Instructions::    Follow nutritous diet    - Follow Up Appointment::    Return to clinic in 2 weeks     Wound 03/22/24 Venous Ulcer Knee Anterior;Left (Active)  Date First Assessed/Time First Assessed: 03/22/24 1059   Pre-Existing Wound: Yes  Primary Wound Type: Venous Ulcer  Location: Knee  Wound Location Orientation: Anterior;Left    Assessments 03/22/2024 10:59 AM 04/21/2024 10:34 AM  Wound Image  Site  Assessment Necrotic tissue, eschar;Slough Slough  Peri-Wound Assessment Clean Clean;Dry;Intact  Drainage Amount Moderate Moderate  Drainage Description Serosanguineous Serosanguineous;Yellow  Wound Odor None --  Wound Length (cm) 3.5 cm 1 cm  Wound Width (cm) 3.5 cm 1 cm  Wound Surface Area (cm^2) 9.62 cm^2 0.79 cm^2  Wound Depth (cm) 0.1 cm 0.1 cm  Wound Volume (cm^3) 0.641 cm^3 0.052 cm^3  Wound Healing % -- 92  Non-staged Wound Description Full thickness Full thickness     Active Orders  Date Order Priority Status Authorizing Provider  04/21/24 1121 Wound Care Routine Active Fonda Charlie Morita, MD    - Wound cleansing::    Other (Specify) (soap and water )    - Dressing change frequency::    Change dressing every day    - Primary wound dressing::    Other (Specify) (ssd cream ,xeroform,vashe moistened 1/4 packing strip)    - Secondary wound dressing::    Other (Specify) (dry dressing)    - Additional Orders/Instructions::    Follow nutritous diet    - Follow Up Appointment::    Return to clinic in 1 week  04/07/24 1251 Wound Care Routine Active Lucie Almarie Engels, DO    - Secondary wound dressing::    ABD pad    - Secure with::    Roll gauze    - Additional Orders/Instructions::    Follow nutritous diet    - Follow Up Appointment::    Return to clinic in 2 weeks  03/22/24 1150 Wound Care Routine Active Fonda Charlie Morita, MD    - Dressing change frequency::    Change dressing every day    - Secondary wound dressing::    ABD pad    - Secure with::    Roll gauze    - Additional Orders/Instructions::    Follow nutritous diet    - Follow Up Appointment::    Return to clinic in 1 week    Assessment Encounter Diagnoses  Name Primary?   Venous stasis ulcers of both lower extremities    (CMD) Yes   Diabetic ulcer of right heel associated with type 2 diabetes mellitus, with fat layer exposed    (CMD)    Diabetic ulcer of other part of right foot associated with  diabetes mellitus due to underlying condition, with fat layer exposed (CMD)      Here for evaluation of numerous wounds. He has been in a wheelchair / non-ambulatory as a result of several hospitalizations.. Majority of the wounds on the feet are pressure related. Biopsy confirmed calciphylaxis of lower extremities.  He is currently on Sodium thiosulfate  at his dialysis center [facility]) and taking 300 mcg of vitamin K2 daily.   Pain - under control with help of [facility].   Legs - showing progress, eschar is loosening. Very swollen today. He is getting fluid removed at dialysis.   Feet wounds  - he is wearing regular shoes and the wheelchair foot plate is causing pressure. Will reach out to [facility]otion for evaluation. He would benefit from foot plates with padding to protect his feet to prevent ulcers.   Abscess on R foot - drained today. Took culture. Place on doxy and get XR. Strict return precautions were given. High risk for worsening and needing surgical intervention.    PLAN: Dressings:  - Apply SSD, xeroform to eschar areas on leg.  - pack vashe dressings to left and R feet - Start doxy, any worsening head to hospital -  Check XR - use offloading boots 24/7 - still not wearing them - causing more pressure on his feet.  - Tubigrip (easier to apply than edemawear) Elevate legs High protein diet  Continue sodium thiosulfate . Taking vitamin k2   Pain - keep f/u with [facility]  F/u 1 week sooner if needed  Debridement Right;Lateral Plantar  Performed by: Fonda Charlie Morita, MD Authorized by: Fonda Charlie Morita, MD  Associated wounds:  Wound 12/15/23 Plantar Right;Lateral  Performed By:  Physician Debridement Type:  Debridement Goals of Debridement:  Decrease risk of infection, Remove devitalized tissue and Promote wound healing Severity of Tissue Pre-Debridement:  Fat Layer Exposed Level of Consciousness Pre-Procedure:  Awake and  Alert Pre-Procedure Verification/Time-Out Taken:  Yes Start Time:  04/21/2024 10:44 AM Pain Control:  Lidocaine  2% Topical Gel Tissue and Other Material Debrided:  Biofilm, Slough, Subcutaneous, Skin - Epidermis and Skin - Dermis Excisional Method:  Excisional Level:  Subcutaneous Tissue Instrument:  Curette Bleeding:  Moderate Hemostasis Achieved:  Pressure Procedural Pain:  Insensate Post Procedural Pain:  Insensate Response to Treatment:  Procedure was Tolerated Well Level of Consciousness Post-Procedure:  Awake and Alert Pre-debridement measurements:    Time taken:  04/21/2024 10:44 AM   Length (cm):  1   Width (cm):  1   Depth (cm):  0.1 Post-debridement measurements:    Time taken:  04/21/2024 10:44 AM   Length (cm):  1   Width (cm):  1.1   Depth (cm):  0.2 Total Area Debrided:    Length (cm):  1   Width (cm):  1.1   Surface Area (cm2):  1.1 Character of Wound / Ulcer Post Debridement:  Improved Severity of Tissue Post Debridement:  Fat Layer Exposed Post Procedure Diagnosis:  Same as Pre-Procedure Wound Dressing:  Other (Comment) Debridement Venous Ulcer Right;Medial Pretibial  Performed by: Fonda Charlie Morita, MD Authorized by: Fonda Charlie Morita, MD  Associated wounds:  Wound 02/23/24 Venous Ulcer Pretibial Right;Medial  Performed By:  Physician Debridement Type:  Debridement Goals of Debridement:  Decrease risk of infection, Remove devitalized tissue and Promote wound healing Severity of Tissue Pre-Debridement:  Fat Layer Exposed Level of Consciousness Pre-Procedure:  Awake and Alert Pre-Procedure Verification/Time-Out Taken:  Yes Start Time:  04/21/2024 10:40 AM Pain Control:  Lidocaine  2% Topical Gel Tissue and Other Material Debrided:  Biofilm, Slough, Subcutaneous, Skin - Epidermis and Skin - Dermis Excisional Method:  Excisional Level:  Subcutaneous Tissue Instrument:  Curette Bleeding:  Moderate Hemostasis Achieved:  Pressure Procedural Pain:   Insensate Post Procedural Pain:  Insensate Response to Treatment:  Procedure was Tolerated Well Level of Consciousness Post-Procedure:  Awake and Alert Pre-debridement measurements:    Time taken:  04/21/2024 10:42 AM   Length (cm):  8   Width (cm):  8.8   Depth (cm):  0.3 Post-debridement measurements:    Time taken:  04/21/2024 10:42 AM   Length (cm):  8   Width (cm):  8   Depth (cm):  0.3 Total Area Debrided:    Length (cm):  6   Width (cm):  7   Surface Area (cm2):  42 Character of Wound / Ulcer Post Debridement:  Improved Severity of Tissue Post Debridement:  Fat Layer Exposed Post Procedure Diagnosis:  Same as Pre-Procedure Wound Dressing:  Other (Comment)    Orders Placed This Encounter  Procedures   Wound Care    Bilateral Legs Wound: Apply SSD cream  at home, Xeroform to eschar, dry dressing, roll gauze, and tubigrip. Change  daily   Right Heel and Plantar: Apply Vashe  moistened 1/4 packing strip, dry dressing Change Daily.    Standing Status:   Standing    Number of Occurrences:   7    Next Expected Occurrence:   04/21/2024    Expiration Date:   04/26/2024    Wound cleansing::   Other (Specify)             soap and water     Dressing change frequency::   Change dressing every day    Primary wound dressing::   Other (Specify)             ssd cream ,xeroform,vashe moistened 1/4 packing strip    Secondary wound dressing::   Other (Specify)             dry dressing    Additional Orders/Instructions::   Follow nutritous diet    Follow Up Appointment::   Return to clinic in 1 week   Anaerobic Culture    Standing Status:   Future    Number of Occurrences:   1    Expected Date:   04/21/2024    Expiration Date:   04/21/2025    Release to patient::   Immediate   Aerobic Culture    Standing Status:   Future    Number of Occurrences:   1    Expected Date:   04/21/2024    Expiration Date:   04/21/2025    Release to patient::   Immediate   XR Foot Minimum 3  Views Right    Standing Status:   Future    Expected Date:   04/21/2024    Expiration Date:   06/22/2026    Release to patient::   Immediate    Fonda Charlie Morita 04/21/2024   Medical History[1]          [1] Past Medical History: Diagnosis Date   Abscess of skin and subcutaneous tissue    Acute blood loss anemia 08/14/2023   Acute cystitis with hematuria 08/07/2023   C. difficile diarrhea 08/07/2023   Gross hematuria 08/14/2023   Hypomagnesemia 08/11/2023   Leukocytosis 08/05/2023   Nephrolithiasis    SCC (squamous cell carcinoma)    Tricompartment osteoarthritis of right knee 11/04/2017

## 2024-04-21 NOTE — Progress Notes (Signed)
 Dressing applied per providers orders.  Patient tolerated procedure well.  Patient given AVS.    One piece of packing used in each foot wound on right side.

## 2024-04-25 ENCOUNTER — Inpatient Hospital Stay (HOSPITAL_COMMUNITY)

## 2024-04-25 ENCOUNTER — Emergency Department (HOSPITAL_COMMUNITY)

## 2024-04-25 ENCOUNTER — Encounter (HOSPITAL_COMMUNITY): Payer: Self-pay

## 2024-04-25 ENCOUNTER — Inpatient Hospital Stay (HOSPITAL_COMMUNITY)
Admission: EM | Admit: 2024-04-25 | Discharge: 2024-05-05 | Disposition: E | Attending: Critical Care Medicine | Admitting: Critical Care Medicine

## 2024-04-25 ENCOUNTER — Other Ambulatory Visit: Payer: Self-pay

## 2024-04-25 DIAGNOSIS — G931 Anoxic brain damage, not elsewhere classified: Secondary | ICD-10-CM | POA: Diagnosis present

## 2024-04-25 DIAGNOSIS — J690 Pneumonitis due to inhalation of food and vomit: Secondary | ICD-10-CM | POA: Diagnosis present

## 2024-04-25 DIAGNOSIS — A419 Sepsis, unspecified organism: Secondary | ICD-10-CM | POA: Diagnosis present

## 2024-04-25 DIAGNOSIS — I469 Cardiac arrest, cause unspecified: Secondary | ICD-10-CM | POA: Diagnosis present

## 2024-04-25 DIAGNOSIS — I120 Hypertensive chronic kidney disease with stage 5 chronic kidney disease or end stage renal disease: Secondary | ICD-10-CM | POA: Diagnosis not present

## 2024-04-25 DIAGNOSIS — I132 Hypertensive heart and chronic kidney disease with heart failure and with stage 5 chronic kidney disease, or end stage renal disease: Secondary | ICD-10-CM | POA: Diagnosis present

## 2024-04-25 DIAGNOSIS — R6521 Severe sepsis with septic shock: Secondary | ICD-10-CM | POA: Diagnosis present

## 2024-04-25 DIAGNOSIS — Z992 Dependence on renal dialysis: Secondary | ICD-10-CM | POA: Diagnosis not present

## 2024-04-25 DIAGNOSIS — R600 Localized edema: Secondary | ICD-10-CM | POA: Diagnosis not present

## 2024-04-25 DIAGNOSIS — N186 End stage renal disease: Secondary | ICD-10-CM | POA: Diagnosis present

## 2024-04-25 DIAGNOSIS — J9601 Acute respiratory failure with hypoxia: Secondary | ICD-10-CM | POA: Diagnosis present

## 2024-04-25 DIAGNOSIS — N390 Urinary tract infection, site not specified: Secondary | ICD-10-CM | POA: Diagnosis present

## 2024-04-25 DIAGNOSIS — G253 Myoclonus: Secondary | ICD-10-CM | POA: Diagnosis not present

## 2024-04-25 DIAGNOSIS — D631 Anemia in chronic kidney disease: Secondary | ICD-10-CM | POA: Diagnosis present

## 2024-04-25 DIAGNOSIS — I21A1 Myocardial infarction type 2: Secondary | ICD-10-CM | POA: Diagnosis present

## 2024-04-25 DIAGNOSIS — R570 Cardiogenic shock: Secondary | ICD-10-CM | POA: Diagnosis present

## 2024-04-25 DIAGNOSIS — L89326 Pressure-induced deep tissue damage of left buttock: Secondary | ICD-10-CM | POA: Diagnosis present

## 2024-04-25 DIAGNOSIS — E872 Acidosis, unspecified: Secondary | ICD-10-CM | POA: Diagnosis present

## 2024-04-25 DIAGNOSIS — Z79899 Other long term (current) drug therapy: Secondary | ICD-10-CM

## 2024-04-25 DIAGNOSIS — I129 Hypertensive chronic kidney disease with stage 1 through stage 4 chronic kidney disease, or unspecified chronic kidney disease: Secondary | ICD-10-CM | POA: Diagnosis not present

## 2024-04-25 DIAGNOSIS — E1122 Type 2 diabetes mellitus with diabetic chronic kidney disease: Secondary | ICD-10-CM | POA: Diagnosis present

## 2024-04-25 DIAGNOSIS — I214 Non-ST elevation (NSTEMI) myocardial infarction: Secondary | ICD-10-CM | POA: Diagnosis not present

## 2024-04-25 DIAGNOSIS — E871 Hypo-osmolality and hyponatremia: Secondary | ICD-10-CM | POA: Diagnosis present

## 2024-04-25 DIAGNOSIS — Z515 Encounter for palliative care: Secondary | ICD-10-CM

## 2024-04-25 DIAGNOSIS — D72823 Leukemoid reaction: Secondary | ICD-10-CM | POA: Diagnosis present

## 2024-04-25 DIAGNOSIS — R680 Hypothermia, not associated with low environmental temperature: Secondary | ICD-10-CM | POA: Diagnosis present

## 2024-04-25 DIAGNOSIS — L89153 Pressure ulcer of sacral region, stage 3: Secondary | ICD-10-CM | POA: Diagnosis present

## 2024-04-25 DIAGNOSIS — I480 Paroxysmal atrial fibrillation: Secondary | ICD-10-CM | POA: Diagnosis present

## 2024-04-25 DIAGNOSIS — L89316 Pressure-induced deep tissue damage of right buttock: Secondary | ICD-10-CM | POA: Diagnosis present

## 2024-04-25 DIAGNOSIS — Z66 Do not resuscitate: Secondary | ICD-10-CM | POA: Diagnosis not present

## 2024-04-25 DIAGNOSIS — Z794 Long term (current) use of insulin: Secondary | ICD-10-CM | POA: Diagnosis not present

## 2024-04-25 DIAGNOSIS — R569 Unspecified convulsions: Secondary | ICD-10-CM | POA: Diagnosis not present

## 2024-04-25 DIAGNOSIS — Z1152 Encounter for screening for COVID-19: Secondary | ICD-10-CM | POA: Diagnosis not present

## 2024-04-25 DIAGNOSIS — I468 Cardiac arrest due to other underlying condition: Secondary | ICD-10-CM | POA: Diagnosis present

## 2024-04-25 DIAGNOSIS — E876 Hypokalemia: Principal | ICD-10-CM | POA: Diagnosis present

## 2024-04-25 DIAGNOSIS — J189 Pneumonia, unspecified organism: Secondary | ICD-10-CM | POA: Diagnosis present

## 2024-04-25 DIAGNOSIS — E785 Hyperlipidemia, unspecified: Secondary | ICD-10-CM | POA: Diagnosis present

## 2024-04-25 DIAGNOSIS — I517 Cardiomegaly: Secondary | ICD-10-CM

## 2024-04-25 LAB — CBC
HCT: 30.5 % — ABNORMAL LOW (ref 39.0–52.0)
Hemoglobin: 8.6 g/dL — ABNORMAL LOW (ref 13.0–17.0)
MCH: 27.5 pg (ref 26.0–34.0)
MCHC: 28.2 g/dL — ABNORMAL LOW (ref 30.0–36.0)
MCV: 97.4 fL (ref 80.0–100.0)
Platelets: 272 K/uL (ref 150–400)
RBC: 3.13 MIL/uL — ABNORMAL LOW (ref 4.22–5.81)
RDW: 16.2 % — ABNORMAL HIGH (ref 11.5–15.5)
WBC: 34.3 K/uL — ABNORMAL HIGH (ref 4.0–10.5)
nRBC: 0 % (ref 0.0–0.2)

## 2024-04-25 LAB — COMPREHENSIVE METABOLIC PANEL WITH GFR
ALT: 9 U/L (ref 0–44)
AST: 20 U/L (ref 15–41)
Albumin: 2.6 g/dL — ABNORMAL LOW (ref 3.5–5.0)
Alkaline Phosphatase: 145 U/L — ABNORMAL HIGH (ref 38–126)
Anion gap: 22 — ABNORMAL HIGH (ref 5–15)
BUN: 18 mg/dL (ref 8–23)
CO2: 22 mmol/L (ref 22–32)
Calcium: 9.2 mg/dL (ref 8.9–10.3)
Chloride: 97 mmol/L — ABNORMAL LOW (ref 98–111)
Creatinine, Ser: 2.83 mg/dL — ABNORMAL HIGH (ref 0.61–1.24)
GFR, Estimated: 23 mL/min — ABNORMAL LOW
Glucose, Bld: 185 mg/dL — ABNORMAL HIGH (ref 70–99)
Potassium: 2.7 mmol/L — CL (ref 3.5–5.1)
Sodium: 140 mmol/L (ref 135–145)
Total Bilirubin: 0.3 mg/dL (ref 0.0–1.2)
Total Protein: 5.5 g/dL — ABNORMAL LOW (ref 6.5–8.1)

## 2024-04-25 LAB — URINALYSIS, ROUTINE W REFLEX MICROSCOPIC
Bilirubin Urine: NEGATIVE
Glucose, UA: NEGATIVE mg/dL
Ketones, ur: NEGATIVE mg/dL
Nitrite: NEGATIVE
Protein, ur: >=300 mg/dL — AB
Specific Gravity, Urine: 1.018 (ref 1.005–1.030)
WBC, UA: >50 WBC/hpf (ref 0–5)
pH: 7 (ref 5.0–8.0)

## 2024-04-25 LAB — I-STAT CG4 LACTIC ACID, ED: Lactic Acid, Venous: 5.3 mmol/L (ref 0.5–1.9)

## 2024-04-25 LAB — I-STAT CHEM 8, ED
BUN: 20 mg/dL (ref 8–23)
Calcium, Ion: 1.26 mmol/L (ref 1.15–1.40)
Chloride: 97 mmol/L — ABNORMAL LOW (ref 98–111)
Creatinine, Ser: 2.9 mg/dL — ABNORMAL HIGH (ref 0.61–1.24)
Glucose, Bld: 182 mg/dL — ABNORMAL HIGH (ref 70–99)
HCT: 31 % — ABNORMAL LOW (ref 39.0–52.0)
Hemoglobin: 10.5 g/dL — ABNORMAL LOW (ref 13.0–17.0)
Potassium: 2.5 mmol/L — CL (ref 3.5–5.1)
Sodium: 138 mmol/L (ref 135–145)
TCO2: 26 mmol/L (ref 22–32)

## 2024-04-25 LAB — TROPONIN T, HIGH SENSITIVITY: Troponin T High Sensitivity: 149 ng/L (ref 0–19)

## 2024-04-25 LAB — CBG MONITORING, ED: Glucose-Capillary: 174 mg/dL — ABNORMAL HIGH (ref 70–99)

## 2024-04-25 LAB — MAGNESIUM: Magnesium: 1.9 mg/dL (ref 1.7–2.4)

## 2024-04-25 MED ORDER — SODIUM CHLORIDE 0.9 % IV SOLN
1.0000 g | Freq: Once | INTRAVENOUS | Status: AC
  Administered 2024-04-25: 1 g via INTRAVENOUS
  Filled 2024-04-25: qty 10

## 2024-04-25 MED ORDER — PROPOFOL 1000 MG/100ML IV EMUL
0.0000 ug/kg/min | INTRAVENOUS | Status: DC
  Administered 2024-04-25: 20 ug/kg/min via INTRAVENOUS
  Administered 2024-04-26: 30 ug/kg/min via INTRAVENOUS
  Administered 2024-04-26 (×2): 55 ug/kg/min via INTRAVENOUS
  Administered 2024-04-26: 10 ug/kg/min via INTRAVENOUS
  Administered 2024-04-26 – 2024-04-28 (×13): 55 ug/kg/min via INTRAVENOUS
  Administered 2024-04-28: 45 ug/kg/min via INTRAVENOUS
  Administered 2024-04-28 (×3): 55 ug/kg/min via INTRAVENOUS
  Administered 2024-04-29: 10 ug/kg/min via INTRAVENOUS
  Administered 2024-04-29: 35 ug/kg/min via INTRAVENOUS
  Filled 2024-04-25 (×4): qty 100
  Filled 2024-04-25: qty 200
  Filled 2024-04-25 (×2): qty 100
  Filled 2024-04-25: qty 200
  Filled 2024-04-25 (×11): qty 100
  Filled 2024-04-25: qty 200
  Filled 2024-04-25: qty 100

## 2024-04-25 MED ORDER — PROPOFOL 1000 MG/100ML IV EMUL: 0.0000 ug/kg/min | INTRAVENOUS | Status: DC

## 2024-04-25 MED ORDER — DOCUSATE SODIUM 100 MG PO CAPS: 100.0000 mg | ORAL_CAPSULE | Freq: Two times a day (BID) | ORAL | Status: DC | PRN

## 2024-04-25 MED ORDER — POTASSIUM CHLORIDE 10 MEQ/100ML IV SOLN
10.0000 meq | INTRAVENOUS | Status: DC
  Administered 2024-04-26: 10 meq via INTRAVENOUS
  Filled 2024-04-25: qty 100

## 2024-04-25 MED ORDER — INSULIN ASPART 100 UNIT/ML IJ SOLN
0.0000 [IU] | INTRAMUSCULAR | Status: DC
  Administered 2024-04-26: 1 [IU] via SUBCUTANEOUS
  Administered 2024-04-26 (×3): 2 [IU] via SUBCUTANEOUS
  Filled 2024-04-25 (×4): qty 2

## 2024-04-25 MED ORDER — AMIODARONE LOAD VIA INFUSION
150.0000 mg | Freq: Once | INTRAVENOUS | Status: AC
  Administered 2024-04-26: 150 mg via INTRAVENOUS
  Filled 2024-04-25: qty 83.34

## 2024-04-25 MED ORDER — EPINEPHRINE HCL 5 MG/250ML IV SOLN IN NS
0.5000 ug/min | INTRAVENOUS | Status: DC
  Administered 2024-04-25: 10 ug/min via INTRAVENOUS
  Administered 2024-04-26: 7 ug/min via INTRAVENOUS
  Administered 2024-04-27: 4 ug/min via INTRAVENOUS
  Administered 2024-04-28: 7 ug/min via INTRAVENOUS
  Administered 2024-04-28: 5 ug/min via INTRAVENOUS
  Filled 2024-04-25 (×5): qty 250

## 2024-04-25 MED ORDER — AMIODARONE HCL IN DEXTROSE 360-4.14 MG/200ML-% IV SOLN
30.0000 mg/h | INTRAVENOUS | Status: DC
  Administered 2024-04-26 – 2024-04-29 (×7): 30 mg/h via INTRAVENOUS
  Filled 2024-04-25 (×6): qty 200

## 2024-04-25 MED ORDER — CHLORHEXIDINE GLUCONATE CLOTH 2 % EX PADS
6.0000 | MEDICATED_PAD | Freq: Every day | CUTANEOUS | Status: DC
  Administered 2024-04-26 – 2024-04-28 (×3): 6 via TOPICAL

## 2024-04-25 MED ORDER — PANTOPRAZOLE SODIUM 40 MG IV SOLR
40.0000 mg | Freq: Every day | INTRAVENOUS | Status: DC
  Administered 2024-04-26 – 2024-04-28 (×4): 40 mg via INTRAVENOUS
  Filled 2024-04-25 (×4): qty 10

## 2024-04-25 MED ORDER — HEPARIN SODIUM (PORCINE) 5000 UNIT/ML IJ SOLN
5000.0000 [IU] | Freq: Three times a day (TID) | INTRAMUSCULAR | Status: DC
  Administered 2024-04-26 – 2024-04-29 (×10): 5000 [IU] via SUBCUTANEOUS
  Filled 2024-04-25 (×10): qty 1

## 2024-04-25 MED ORDER — AMIODARONE HCL IN DEXTROSE 360-4.14 MG/200ML-% IV SOLN
60.0000 mg/h | INTRAVENOUS | Status: AC
  Administered 2024-04-26 (×2): 60 mg/h via INTRAVENOUS
  Filled 2024-04-25 (×2): qty 200

## 2024-04-25 MED ORDER — POLYETHYLENE GLYCOL 3350 17 G PO PACK: 17.0000 g | PACK | Freq: Every day | ORAL | Status: DC | PRN

## 2024-04-25 NOTE — ED Provider Notes (Signed)
 " [place] [facility] AT [facility] Provider Note   HPI/ROS    History obtained from EMS.  Calvin Schultz is a 73 y.o. male who presents for Cardiac Arrest and who  has a past medical history of Arthritis, Atrial fibrillation (HCC), CKD (chronic kidney disease) stage 3, GFR 30-59 ml/min (HCC), Diabetes mellitus without complication (HCC), Dysrhythmia, Hyperlipidemia, and Hypertension.  Patient brought in today by EMS for a witnessed arrest at home.  Per EMS, the wife states that he was sitting in his chair when he slumped over and had no pulses.  Unclear not whether the patient's family did compressions or not.  When EMS arrived they did compressions for roughly 9 minutes and got ROSC.  It was intubated in the field.  Patient was given 50 mg of bicarb, 1 g of calcium , 1500 of NS, and placed on an epi drip at 8 mics per minute prior to arrival.  On arrival he was hypotensive with systolics in the 60s.  MDM   I have reviewed the nursing documentation, vital signs, as well as the past medical history, surgical history, family history, and social history.  Initial Assessment:  Patient hypotensive on arrival and hypothermic.  Good breath sounds bilaterally.  Initial EKG here with some ST elevations in V1 and aVR, without other obvious STEMI pattern.  Unclear etiology of PEA arrest, could be electrolyte abnormality, STEMI, or PE.  Will send off i-STAT BMP.  BMP resulted with hypokalemia to 2.5.  Could be etiology.  Bedside echo with relatively preserved biventricular function without signs of pericardial tamponade.  Cardiology consulted intensive care consulted at this time.  Cardiology agrees that hypokalemia likely etiology of cardiac arrest.  Patient given IV potassium.  Also started on amiodarone .  2 attempts made at A-line without successful cannulation given significant atherosclerosis of disease.  Procedure terminated.  Will likely require more central A-line placement such as  axillary or femoral.  Lactate elevated to 5.3.  CBC and CMP otherwise largely unremarkable.  Feel anion gap is likely secondary to lactate.  Spoke with Dr. Orlin and he plans for admission to the ICU.  Prior to ICU admission patient will have full scans.  Disposition:  I discussed the case with Dr.Naveed who graciously agreed to admit the patient to their service for continued care.   This patient was staffed with Dr. Bernard who supervised the visit and agreed with the plan of care.   Due to the patients current presenting symptoms, physical exam findings, and the workup stated above, it is thought that the etiology of the patients current presentation is:  1. Cardiac arrest [facility])    Clinical Complexity A medically appropriate history, review of systems, and physical exam was performed.  Factors that affect the complexity of this encounter: assessment of correct protocol, laboratory work from this visit, notes from other physicians (family medicine notes), and review of echocardiogram/EKG results  My independent interpretations of diagnostic studies are documented in the ED course above.   If decision rules were used in this patient's evaluation, they are listed below.   Click here for ABCD2, HEART and other calculators  Patient's presentation is most consistent with acute presentation with potential threat to life or bodily function.  MDM generated using voice dictation software and may contain dictation errors. Please contact me for any clarification or with any questions.    Physical Exam, PMH, PSH, Family History, and Social Hsitory   Vitals:   04/25/24 2215 04/25/24 2248 04/25/24  2252 04/25/24 2254  BP:  108/78 117/76 124/80  Pulse:  90 95 91  Resp:  (!) 23 20 (!) 24  Temp:  (!) 94.7 F (34.8 C) (!) 94.6 F (34.8 C) (!) 94.6 F (34.8 C)  SpO2: 100% 100% 100% 100%  Height: 5' 8 (1.727 m)       Physical Exam Constitutional:      Appearance: He is ill-appearing.   HENT:     Head: Normocephalic and atraumatic.  Eyes:     Conjunctiva/sclera: Conjunctivae normal.     Comments: Pupils 3 mm and nonreactive bilaterally  Cardiovascular:     Rate and Rhythm: Normal rate. Rhythm irregular.  Pulmonary:     Effort: Pulmonary effort is normal.     Breath sounds: Normal breath sounds.     Comments: Mechanically ventilated breath sound Abdominal:     General: Abdomen is flat.     Palpations: Abdomen is soft.  Musculoskeletal:        General: Normal range of motion.  Skin:    General: Skin is dry.     Capillary Refill: Capillary refill takes less than 2 seconds.     Comments: Cool  Neurological:     General: No focal deficit present.     Mental Status: He is oriented to person, place, and time.     Past Medical History:  Diagnosis Date   Arthritis    Atrial fibrillation (HCC)    CKD (chronic kidney disease) stage 3, GFR 30-59 ml/min (HCC)    Diabetes mellitus without complication (HCC)    Dysrhythmia    Hyperlipidemia    Hypertension      Past Surgical History:  Procedure Laterality Date   AV FISTULA PLACEMENT Left 10/09/2023   Procedure: ARTERIOVENOUS (AV) FISTULA CREATION, FIRST STAGE BASILIC;  Surgeon: Pearline Norman RAMAN, MD;  Location: [facility] OR;  Service: Vascular;  Laterality: Left;  LEFT ARM AVF VERSUS AVG   CYSTOSCOPY W/ URETERAL STENT PLACEMENT Right 08/05/2023   Procedure: CYSTOSCOPY, WITH RETROGRADE PYELOGRAM AND URETERAL STENT INSERTION;  Surgeon: Roseann Adine PARAS., MD;  Location: [facility] OR;  Service: Urology;  Laterality: Right;   CYSTOSCOPY/URETEROSCOPY/HOLMIUM LASER/STENT PLACEMENT Left 07/24/2023   Procedure: CYSTOSCOPY/URETEROSCOPY/HOLMIUM LASER/ STENT PLACEMENT/REMOVAL OF NEPHROSTOMY TUBE;  Surgeon: Nieves Cough, MD;  Location: [facility] ORS;  Service: Urology;  Laterality: Left;  90 MINUTE CASE   CYSTOSCOPY/URETEROSCOPY/HOLMIUM LASER/STENT PLACEMENT Bilateral 08/18/2023   Procedure: CYSTOSCOPY/URETEROSCOPY/HOLMIUM LASER/STENT PLACEMENT;   Surgeon: Nieves Cough, MD;  Location: [facility] OR;  Service: Urology;  Laterality: Bilateral;   DIALYSIS/PERMA CATHETER INSERTION N/A 01/20/2024   Procedure: DIALYSIS/PERMA CATHETER INSERTION;  Surgeon: Lanis Fonda BRAVO, MD;  Location[facility] INVASIVE CV LAB;  Service: Cardiovascular;  Laterality: N/A;   INSERTION OF DIALYSIS CATHETER Right 10/09/2023   Procedure: INSERTION OF DIALYSIS CATHETER, USING PALINDROME CATHETER KIT 19CM;  Surgeon: Pearline Norman RAMAN, MD;  Location: [facility] OR;  Service: Vascular;  Laterality: Right;  TUNNELED DIALYSIS CATHETER   IR NEPHROSTOMY PLACEMENT LEFT  06/03/2023   KIDNEY SURGERY     TRANSESOPHAGEAL ECHOCARDIOGRAM (CATH LAB) N/A 01/19/2024   Procedure: TRANSESOPHAGEAL ECHOCARDIOGRAM;  Surgeon: Shlomo Wilbert SAUNDERS, MD;  Location: [facility] INVASIVE CV LAB;  Service: Cardiovascular;  Laterality: N/A;   TUNNELLED CATHETER EXCHANGE N/A 11/20/2023   Procedure: TUNNELLED CATHETER EXCHANGE;  Surgeon: Sheree Penne Bruckner, MD;  Location: [facility] LAB;  Service: Cardiovascular;  Laterality: N/A;     Family History  Problem Relation Age of Onset   Dementia Mother     Social History  Tobacco Use   Smoking status: Never   Smokeless tobacco: Never  Substance Use Topics   Alcohol  use: Never     Procedures   If procedures were preformed on this patient, they are listed below:  ARTERIAL LINE  Date/Time: 04/25/2024 11:55 PM  Performed by: Guillermina Hamilton, MD Authorized by: Bernard Drivers, MD   Consent:    Consent obtained:  Emergent situation Indications:    Indications: hemodynamic monitoring   Pre-procedure details:    Skin preparation:  Chlorhexidine  Sedation:    Sedation type:  Deep Anesthesia:    Anesthesia method:  None Procedure details:    Location:  R radial   Allen's test performed: yes     Allen's test abnormal: no     Needle gauge:  20 G   Placement technique:  Seldinger   Number of attempts:  2 Post-procedure details:    Procedure completion:  Procedure  terminated electively by provider  Electronically signed by:   Hamilton Carlin Guillermina, M.D. PGY-2, Emergency Medicine   Please note that this documentation was produced with the assistance of voice-to-text technology and may contain errors.    Guillermina Hamilton, MD 04/25/24 TEDDI    Bernard Drivers, MD 04/30/24 1420  "

## 2024-04-25 NOTE — ED Triage Notes (Signed)
 Pt bibgcems from home witness arrest and cpr with ems for 9 minutes. Ems gave 50 mg bicarb, 1 g calcium , 1500 NS, epi at 8mcg/min.   Bp 68/40 94% Hr 70 Cbg 303

## 2024-04-25 NOTE — H&P (Signed)
 "  NAME:  Calvin Schultz, MRN:  [id number], DOB:  [birth year], LOS: 0 ADMISSION DATE:  04/25/2024, CONSULTATION DATE:  04/25/2024 REFERRING MD:  , CHIEF COMPLAINT:  Cardiac arrest  History of Present Illness:  73 y/o male with PMH for DMT2, ESRD on HD T/TH/S, HTN, A fib, HLD, Chronic LE wounds, C diff and edema, who was in his usual state of health and with his wife and went to turn off the fireplace and suddenly went blank and wife pulled him out of his wheelchair and started CPR and called EMS who continued CPR.  Initial reports suggest PEA arrest.  He was dialysis yesterday, normally MWF and recently switched to T/TH/S.  Intubated in the field.  In the ED he was found to have K: 2.5, Cr 2.90, BNP 640, Trop 149 and LA 5.3.  He was placed on Epi drip.,  POCUS at bedside, no tamponade, LV function seems intact, enlarged right heart and septum irregularity.  Pertinent  Medical History  DMT2, ESRD on HD T/TH/S, HTN, A fib, HLD, Chronic LE wounds, C diff and edema  Significant Hospital Events: Including procedures, antibiotic start and stop dates in addition to other pertinent events   12/22: Admit to ICU  Interim History / Subjective:  N/a  Objective    Blood pressure 124/80, pulse 91, temperature (!) 94.6 F (34.8 C), resp. rate (!) 24, height 5' 8 (1.727 m), SpO2 100%.    Vent Mode: PRVC FiO2 (%):  [100 %] 100 % Set Rate:  [18 bmp] 18 bmp Vt Set:  [550 mL] 550 mL PEEP:  [5 cmH20] 5 cmH20 Plateau Pressure:  [13 cmH20] 13 cmH20  No intake or output data in the 24 hours ending 04/25/24 2332 There were no vitals filed for this visit.  Examination: General: sedated and intubated, NAD HENT: pupils reactive, no icterus Lungs: CTA no creps or wheezes Cardiovascular: reg with irreg, s1s2 Abdomen: soft nt nd bs pos no guarding Extremities: B/l LE in bandages and edema b/l to knees and thighs, no cyanosis or clubbing, right groin dialysis catheter Neuro: sedated and intubated GU:  foley  Resolved problem list   Assessment and Plan  Cardiac arrest-witnessed out of hospital Likely triggered by hypokalemia Will need to r/o Pulmonary embolism, given LE edema and right hart enlargement on POCUS Having frequent PVCs and short bursts of V tach Normothermic protocol Vasopressors tapering Echo Hypokalemia Replace K Monitor levels Acute hypoxic respiratory failure Vent management SAT/SBT when medically appropriate NSTEMI Cardiology consulted Trend troponins Holding off heparin  drip at this time Likely demand ischemia from cardiac arrest ESRD on HD Nephrology consult Will need dialysis Lactic acidosis Should clear with dialysis and treatment of cardiac arrest   Labs   CBC: Recent Labs  Lab 04/25/24 2217 04/25/24 2218  WBC 34.3*  --   HGB 8.6* 10.5*  HCT 30.5* 31.0*  MCV 97.4  --   PLT 272  --     Basic Metabolic Panel: Recent Labs  Lab 04/25/24 2217 04/25/24 2218 04/25/24 2222  NA 140 138  --   K 2.7* 2.5*  --   CL 97* 97*  --   CO2 22  --   --   GLUCOSE 185* 182*  --   BUN 18 20  --   CREATININE 2.83* 2.90*  --   CALCIUM  9.2  --   --   MG  --   --  1.9   GFR: CrCl cannot be calculated (Unknown ideal weight.). Recent Labs  Lab 04/25/24 2217 04/25/24 2218  WBC 34.3*  --   LATICACIDVEN  --  5.3*    Liver Function Tests: Recent Labs  Lab 04/25/24 2217  AST 20  ALT 9  ALKPHOS 145*  BILITOT 0.3  PROT 5.5*  ALBUMIN  2.6*   No results for input(s): LIPASE, AMYLASE in the last 168 hours. No results for input(s): AMMONIA in the last 168 hours.  ABG    Component Value Date/Time   TCO2 26 04/25/2024 2218     Coagulation Profile: No results for input(s): INR, PROTIME in the last 168 hours.  Cardiac Enzymes: No results for input(s): CKTOTAL, CKMB, CKMBINDEX, TROPONINI in the last 168 hours.  HbA1C: Hgb A1c MFr Bld  Date/Time Value Ref Range Status  01/15/2024 08:58 AM 5.6 4.8 - 5.6 % Final    Comment:     (NOTE) Diagnosis of Diabetes The following HbA1c ranges recommended by the American Diabetes Association (ADA) may be used as an aid in the diagnosis of diabetes mellitus.  Hemoglobin             Suggested A1C NGSP%              Diagnosis  <5.7                   Non Diabetic  5.7-6.4                Pre-Diabetic  >6.4                   Diabetic  <7.0                   Glycemic control for                       adults with diabetes.    06/26/2023 04:48 PM 6.2 (H) 4.8 - 5.6 % Final    Comment:    (NOTE) Pre diabetes:          5.7%-6.4%  Diabetes:              >6.4%  Glycemic control for   <7.0% adults with diabetes     CBG: Recent Labs  Lab 04/25/24 2213  GLUCAP 174*    Review of Systems:   Intubated and sedated  Past Medical History:  He,  has a past medical history of Arthritis, Atrial fibrillation (HCC), CKD (chronic kidney disease) stage 3, GFR 30-59 ml/min (HCC), Diabetes mellitus without complication (HCC), Dysrhythmia, Hyperlipidemia, and Hypertension.   Surgical History:   Past Surgical History:  Procedure Laterality Date   AV FISTULA PLACEMENT Left 10/09/2023   Procedure: ARTERIOVENOUS (AV) FISTULA CREATION, FIRST STAGE BASILIC;  Surgeon: Pearline Norman RAMAN, MD;  Location: [facility] OR;  Service: Vascular;  Laterality: Left;  LEFT ARM AVF VERSUS AVG   CYSTOSCOPY W/ URETERAL STENT PLACEMENT Right 08/05/2023   Procedure: CYSTOSCOPY, WITH RETROGRADE PYELOGRAM AND URETERAL STENT INSERTION;  Surgeon: Roseann Adine PARAS., MD;  Location: [facility] OR;  Service: Urology;  Laterality: Right;   CYSTOSCOPY/URETEROSCOPY/HOLMIUM LASER/STENT PLACEMENT Left 07/24/2023   Procedure: CYSTOSCOPY/URETEROSCOPY/HOLMIUM LASER/ STENT PLACEMENT/REMOVAL OF NEPHROSTOMY TUBE;  Surgeon: Nieves Cough, MD;  Location: [facility] ORS;  Service: Urology;  Laterality: Left;  90 MINUTE CASE   CYSTOSCOPY/URETEROSCOPY/HOLMIUM LASER/STENT PLACEMENT Bilateral 08/18/2023   Procedure: CYSTOSCOPY/URETEROSCOPY/HOLMIUM  LASER/STENT PLACEMENT;  Surgeon: Nieves Cough, MD;  Location: [facility] OR;  Service: Urology;  Laterality: Bilateral;   DIALYSIS/PERMA CATHETER INSERTION N/A 01/20/2024   Procedure: DIALYSIS/PERMA CATHETER INSERTION;  Surgeon: Lanis Fonda BRAVO, MD;  Location: [facility] INVASIVE CV LAB;  Service: Cardiovascular;  Laterality: N/A;   INSERTION OF DIALYSIS CATHETER Right 10/09/2023   Procedure: INSERTION OF DIALYSIS CATHETER, USING PALINDROME CATHETER KIT 19CM;  Surgeon: Pearline Norman RAMAN, MD;  Location: [facility] OR;  Service: Vascular;  Laterality: Right;  TUNNELED DIALYSIS CATHETER   IR NEPHROSTOMY PLACEMENT LEFT  06/03/2023   KIDNEY SURGERY     TRANSESOPHAGEAL ECHOCARDIOGRAM (CATH LAB) N/A 01/19/2024   Procedure: TRANSESOPHAGEAL ECHOCARDIOGRAM;  Surgeon: Shlomo Wilbert SAUNDERS, MD;  Location: [facility] INVASIVE CV LAB;  Service: Cardiovascular;  Laterality: N/A;   TUNNELLED CATHETER EXCHANGE N/A 11/20/2023   Procedure: TUNNELLED CATHETER EXCHANGE;  Surgeon: Sheree Penne Bruckner, MD;  Location: [facility];  Service: Cardiovascular;  Laterality: N/A;     Social History:   reports that he has never smoked. He has never used smokeless tobacco. He reports that he does not drink alcohol  and does not use drugs.   Family History:  His family history includes Dementia in his mother.   Allergies Allergies[1]   Home Medications  Prior to Admission medications  Medication Sig Start Date End Date Taking? Authorizing Provider  acetaminophen  (TYLENOL ) 325 MG tablet Take 2 tablets (650 mg total) by mouth every 4 (four) hours as needed for mild pain (pain score 1-3) or fever. 08/05/23   Angiulli, Toribio PARAS, PA-C  docusate sodium  (COLACE) 100 MG capsule Take 1 capsule (100 mg total) by mouth 2 (two) times daily as needed for mild constipation. Patient not taking: Reported on 03/01/2024 01/23/24   Tobie Yetta HERO, MD  gabapentin  (NEURONTIN ) 100 MG capsule Take 100 mg by mouth at bedtime. 12/29/23   [provider]  iron   polysaccharides (NIFEREX) 150 MG capsule Take 1 capsule (150 mg total) by mouth daily. 07/11/23   Vann, Jessica U, DO  LANTUS SOLOSTAR 100 UNIT/ML Solostar Pen Inject 3-4 Units into the skin daily as needed (for hyperglycemia). 11/02/23   [provider]  midodrine  (PROAMATINE ) 10 MG tablet Take 1 tablet (10 mg total) by mouth 3 (three) times daily with meals. 01/23/24   Patel, Pranav M, MD  multivitamin (RENA-VIT) TABS tablet Take 1 tablet by mouth at bedtime. 08/05/23   Angiulli, Toribio PARAS, PA-C  Nutritional Supplements (,FEEDING SUPPLEMENT, PROSOURCE PLUS) liquid Take 30 mLs by mouth 2 (two) times daily between meals. Patient not taking: Reported on 03/01/2024 01/23/24   Tobie Yetta HERO, MD  Nutritional Supplements (FEEDING SUPPLEMENT, NEPRO CARB STEADY,) LIQD Take 237 mLs by mouth 3 (three) times daily between meals. Patient not taking: Reported on 03/01/2024 01/23/24   Tobie Yetta HERO, MD  Nystatin (GERHARDT'S BUTT CREAM) CREA Apply 1 Application topically 2 (two) times daily. 08/20/23   Angiulli, Daniel J, PA-C  oxyCODONE -acetaminophen  (PERCOCET) 10-325 MG tablet Take 1 tablet by mouth every 6 (six) hours as needed for pain. Patient not taking: Reported on 03/01/2024 01/23/24 01/22/25  Patel, Pranav M, MD  polyethylene glycol (MIRALAX  / GLYCOLAX ) 17 g packet Take 17 g by mouth daily. Patient not taking: Reported on 03/01/2024 01/23/24   Tobie Yetta HERO, MD  sevelamer  carbonate (RENVELA ) 800 MG tablet Take 2 tablets (1,600 mg total) by mouth 3 (three) times daily with meals. 01/23/24   Tobie Yetta HERO, MD  silver  sulfADIAZINE  (SILVADENE ) 1 % cream Apply 1 Application topically daily. Apply to legs 11/21/23 07/10/24  [provider]  tamsulosin  (FLOMAX ) 0.4 MG CAPS capsule Take 1 capsule (0.4 mg total) by mouth daily after supper. 10/16/23  Tobie Yetta HERO, MD  tiZANidine  (ZANAFLEX ) 4 MG tablet Take 1 tablet (4 mg total) by mouth 2 (two) times daily as needed for muscle spasms. 08/05/23    Angiulli, Toribio PARAS, PA-C  traZODone  (DESYREL ) 50 MG tablet Take 1 tablet (50 mg total) by mouth at bedtime as needed for sleep. 10/16/23   Tobie Yetta HERO, MD     Critical care time: 77   The patient is critically ill with multiple organ system failure and requires high complexity decision making for assessment and support, frequent evaluation and titration of therapies, advanced monitoring, review of radiographic studies and interpretation of complex data.   Critical Care Time devoted to patient care services, exclusive of separately billable procedures, described in this note is 38 minutes.   Orlin Fairly, MD [facility] See Amion for pager  If no response to pager , please call [phone] until 7pm After 7:00 pm call Elink  [phone] 04/25/2024, 11:32 PM            [1] No Known Allergies  "

## 2024-04-25 NOTE — Progress Notes (Signed)
 Debridement Right;Lower;Lateral Leg  Performed by: Fonda Charlie Morita, MD Authorized by: Fonda Charlie Morita, MD  Associated wounds:  Wound 12/15/23 Leg Right;Lower;Lateral  Performed By:  Physician Debridement Type:  Debridement Goals of Debridement:  Promote wound healing Severity of Tissue Pre-Debridement:  Fat Layer Exposed Level of Consciousness Pre-Procedure:  Awake and Alert Pre-Procedure Verification/Time-Out Taken:  Yes Start Time:  04/25/2024 11:30 AM Tissue and Other Material Debrided:  Slough, Subcutaneous, Biofilm and Eschar Excisional Method:  Excisional Level:  Subcutaneous Tissue Instrument:  Curette Specimen:  None Bleeding:  Moderate Hemostasis Achieved:  Pressure End Time:  04/25/2024 11:34 AM Procedural Pain:  0 Post Procedural Pain:  0 Response to Treatment:  Procedure was Tolerated Well Level of Consciousness Post-Procedure:  Awake and Alert Pre-debridement measurements:    Time taken:  04/25/2024 11:16 AM   Length (cm):  22   Width (cm):  16   Depth (cm):  0.3 Post-debridement measurements:    Time taken:  04/25/2024 11:16 AM   Length (cm):  22   Width (cm):  16   Depth (cm):  0.3 Total Area Debrided:    Length (cm):  3   Width (cm):  4   Surface Area (cm2):  12 Character of Wound / Ulcer Post Debridement:  Stable Severity of Tissue Post Debridement:  Fat Layer Exposed Post Procedure Diagnosis:  Same as Pre-Procedure Wound Dressing:  Other (Comment) Debridement Venous Ulcer Right;Medial Pretibial  Performed by: Fonda Charlie Morita, MD Authorized by: Fonda Charlie Morita, MD  Associated wounds:  Wound 02/23/24 Venous Ulcer Pretibial Right;Medial  Performed By:  Physician Debridement Type:  Debridement Goals of Debridement:  Promote wound healing Severity of Tissue Pre-Debridement:  Fat Layer Exposed Level of Consciousness Pre-Procedure:  Awake and Alert Pre-Procedure Verification/Time-Out Taken:  Yes Start Time:  04/25/2024 11:35  AM Tissue and Other Material Debrided:  Slough, Subcutaneous, Biofilm and Eschar Excisional Method:  Excisional Level:  Subcutaneous Tissue Instrument:  Curette Specimen:  None Bleeding:  Moderate Hemostasis Achieved:  Pressure End Time:  04/25/2024 11:36 AM Procedural Pain:  0 Post Procedural Pain:  0 Response to Treatment:  Procedure was Tolerated Well Level of Consciousness Post-Procedure:  Awake and Alert Pre-debridement measurements:    Time taken:  04/25/2024 11:15 AM   Length (cm):  6.5   Width (cm):  7   Depth (cm):  0.4 Post-debridement measurements:    Time taken:  04/25/2024 11:15 AM   Length (cm):  6.5   Width (cm):  7   Depth (cm):  0.4 Total Area Debrided:    Length (cm):  5   Width (cm):  2   Surface Area (cm2):  10 Character of Wound / Ulcer Post Debridement:  Stable Severity of Tissue Post Debridement:  Fat Layer Exposed Post Procedure Diagnosis:  Same as Pre-Procedure Wound Dressing:  Other (Comment) Debridement Left;Lateral Pretibial  Performed by: Fonda Charlie Morita, MD Authorized by: Fonda Charlie Morita, MD  Associated wounds:  Wound 01/26/24 Pretibial Left;Lateral  Performed By:  Physician Debridement Type:  Debridement Goals of Debridement:  Promote wound healing Severity of Tissue Pre-Debridement:  Fat Layer Exposed Level of Consciousness Pre-Procedure:  Awake and Alert Pre-Procedure Verification/Time-Out Taken:  Yes Start Time:  04/25/2024 11:36 AM Tissue and Other Material Debrided:  Slough, Subcutaneous, Biofilm and Eschar Excisional Method:  Excisional Level:  Subcutaneous Tissue Instrument:  Curette Specimen:  None Bleeding:  Moderate Hemostasis Achieved:  Pressure End Time:  04/25/2024 11:43 AM Procedural Pain:  0 Post Procedural Pain:  0 Response to Treatment:  Procedure was Tolerated Well Level of Consciousness Post-Procedure:  Awake and Alert Pre-debridement measurements:    Time taken:  04/25/2024 11:10 AM   Length (cm):   14.5   Width (cm):  6.5   Depth (cm):  0.4 Post-debridement measurements:    Time taken:  04/25/2024 11:10 AM   Length (cm):  14.5   Width (cm):  6.5   Depth (cm):  0.4 Total Area Debrided:    Length (cm):  5   Width (cm):  4   Surface Area (cm2):  20 Character of Wound / Ulcer Post Debridement:  Stable Severity of Tissue Post Debridement:  Fat Layer Exposed Post Procedure Diagnosis:  Same as Pre-Procedure Wound Dressing:  Other (Comment) Debridement Left;Posterior Leg  Performed by: Fonda Charlie Morita, MD Authorized by: Fonda Charlie Morita, MD  Associated wounds:  Wound 12/15/23 Leg Left;Posterior  Performed By:  Physician Debridement Type:  Debridement Goals of Debridement:  Promote wound healing Severity of Tissue Pre-Debridement:  Fat Layer Exposed Level of Consciousness Pre-Procedure:  Awake and Alert Pre-Procedure Verification/Time-Out Taken:  Yes Start Time:  04/25/2024 11:41 AM Tissue and Other Material Debrided:  Biofilm, Eschar and Slough Excisional Method:  Excisional Level:  Subcutaneous Tissue Instrument:  Curette Specimen:  None Bleeding:  Moderate Hemostasis Achieved:  Pressure End Time:  04/25/2024 11:42 AM Procedural Pain:  0 Post Procedural Pain:  0 Response to Treatment:  Procedure was Tolerated Well Level of Consciousness Post-Procedure:  Awake and Alert Pre-debridement measurements:    Time taken:  04/25/2024 11:14 AM   Length (cm):  18.5   Width (cm):  13.5   Depth (cm):  0.4 Post-debridement measurements:    Time taken:  04/25/2024 11:14 AM   Length (cm):  18.5   Width (cm):  13.5   Depth (cm):  0.4 Total Area Debrided:    Length (cm):  5   Width (cm):  5   Surface Area (cm2):  25 Character of Wound / Ulcer Post Debridement:  Stable Severity of Tissue Post Debridement:  Fat Layer Exposed Post Procedure Diagnosis:  Same as Pre-Procedure Wound Dressing:  Other (Comment)

## 2024-04-25 NOTE — Progress Notes (Signed)
 [facility]  Referred by: Jesus Elberta Gainer, NP  Chief Complaint: Wound  PMH Includes: - DM2 - CKD - Obesity - HTN - Atrial fibrillation  - HLD - Malnutrition   Initial History 12/29/23:  Here for evaluation of both legs ulcerations and R foot ulcerations. He was sick and admitted for c. Diff. He developed these wounds a few months ago. They started from his swelling and trauma. He denies fevers or chills. He has some pain mostly at nigh that is burning. The pain is not significant. He notes the pain is mostly a night. Denies any intense pain or rapid worsening. He notes progress with edema. The swelling is less with dialysis.   Admitted 3-7 through 4-18 due to diarrhea. He was previously treated in January for infected kidney stone and diuresed due to volume overload.  Admitted 5-29 through 10-16-23 due to anasarca. Worsening CKD. Advanced to ESRD. Strated on HD. Required RBCs Saw PCP on 11-12-23 due to wound on R leg from hitting his bed. Also wound on R heel present for a long period of time. Noted to be very swollen. Significant edema.   Relevant work up/results: TTE 10-13-23   A1c 5.9 11-12-23 CBC with hgb 8.7 10-19-23 CMP with albumin  2.5 10-16-23  Interval history/progress: Initial visit - see above  01-07-24 Wound on R leg appear slightly larger. Still eschar.  Left leg stable. Right foot stable He is feeling well. Denies concerns.   02-09-24 Here for follow up. 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  He was admitted 9-11 through 01-23-24. Reviewed significant records. Admitted due to septic shock. Wounds with calciphylaxis. Blood cultures with MSSA. No evidence of vegetation. XR were negative for osteomyelitis. He reports still receiving abx at dialysis. He is also receiving STS. He is still having pain.   ABI 01-14-24   Venous:     02-23-24 Wounds are progressing. The eschar  is loosening.  He still has not heard from pain management. That is his major concern.  New wound on left foot in similar place to R foot. R foot is healthier. He is still wearing regular tennis shoes today to visit. He reports using offloading boots regularly at home No systemic symptoms of fevers, chills, nausea or vomiting.   03-08-24 Wounds are showing some progress No concerns.   03-22-24 Wounds are healing up slowly.  Homehealth nurse stopped coming about 2 weeks ago unclear why Noted some redness on R foot No fevers or chills.  Not using offloading boots during the day  04-07-24 Wounds appear stable. Did not receive xeroform, has been using SSD only.   04-21-24 Wounds are improving on legs. He is more swollen today. He notes he gained some fluid they are trying to remove at dialysis. Denies fevers or chills.    04-25-24 Wounds are overall improving. Footplate is not on today. Brought his boots.  He is tolerating doxycycline . Still not using offloading boots.  Has not done XR yet Accompanied by his niece, who is helping to change dressings   Vitals:   04/25/24 1106  BP: (!) 95/57  Pulse: 84  Temp:       Exam: Pt is AA in NAD. Oriented Wound(s) exam: Right leg: Large ulcerations on the leg. Eschar coming off in areas. Selectively excised portions. No infectious signs.   Right heel:  Plantar heel with some eschar. No infection Posterior aspect with circular  ulcer. No infection.   5th MTB improved. Still some depth. Redness/warmth and streaking have improved.    Left leg: several ulcerations present. Excised to a healthier base as tolerated portions of the wound.    Left 5th MTB and MTH with eschar. No infection.   Wound 12/15/23 Leg Right;Lower;Lateral (Active)  Date First Assessed/Time First Assessed: 12/15/23 0920   Pre-Existing Wound: Yes  Location: Leg  Wound Location Orientation: Right;Lower;Lateral    Assessments 12/15/2023  9:49 AM 04/25/2024 11:16 AM   Wound Image      Site Assessment Necrotic tissue, eschar Necrotic tissue, eschar;Pink;Slough  Peri-Wound Assessment Clean;Dry Clean  Drainage Amount Moderate Moderate  Drainage Description Serosanguineous Serosanguineous  Wound Odor None None  Wound Length (cm) 17.5 cm 22 cm  Wound Width (cm) 8.5 cm 16 cm  Wound Surface Area (cm^2) 116.83 cm^2 --  Wound Depth (cm) 0.2 cm 0.3 cm  Wound Volume (cm^3) 15.577 cm^3 55.292 cm^3  Wound Healing % -- -255  Non-staged Wound Description Full thickness Full thickness     Active Orders  Date Order Priority Status Authorizing Provider  04/25/24 1201 Wound Care Routine Active Fonda Charlie Morita, MD    - Wound cleansing::    Other (Specify) (soap and water )    - Dressing change frequency::    Change dressing every day    - Secondary wound dressing::    Other (Specify) (dry dressing)    - Secure with::    Roll gauze    - Additional Orders/Instructions::    Follow nutritous diet    - Follow Up Appointment::    Return to clinic in 1 week  04/25/24 1130 Debridement Right;Lower;Lateral Leg Routine Active Fonda Charlie Morita, MD  04/21/24 1121 Wound Care Routine Active Fonda Charlie Morita, MD    - Wound cleansing::    Other (Specify) (soap and water )    - Dressing change frequency::    Change dressing every day    - Primary wound dressing::    Other (Specify) (ssd cream ,xeroform,vashe moistened 1/4 packing strip)    - Secondary wound dressing::    Other (Specify) (dry dressing)    - Additional Orders/Instructions::    Follow nutritous diet    - Follow Up Appointment::    Return to clinic in 1 week  04/07/24 1251 Wound Care Routine Active Lucie Almarie Engels, DO    - Secondary wound dressing::    ABD pad    - Secure with::    Roll gauze    - Additional Orders/Instructions::    Follow nutritous diet    - Follow Up Appointment::    Return to clinic in 2 weeks  03/22/24 1150 Wound Care Routine Active Fonda Charlie Morita, MD    -  Dressing change frequency::    Change dressing every day    - Secondary wound dressing::    ABD pad    - Secure with::    Roll gauze    - Additional Orders/Instructions::    Follow nutritous diet    - Follow Up Appointment::    Return to clinic in 1 week  02/09/24 1118 Wound Care Routine Active Fonda Charlie Morita, MD    - Wound cleansing::    Other (Specify) (soap and water )    - Dressing change frequency::    Change dressing every day    - Primary wound dressing::    Other (Specify) (Silvadene  cream,Aquacel)    - Secondary wound dressing::    Other (Specify) (dry dressing)    -  Additional Orders/Instructions::    Follow nutritous diet    - Follow Up Appointment::    Return to clinic in 2 weeks  01/28/24 1022 Wound Care Routine Active Jerilynn Cheryl Pouch, MD    - Wound cleansing::    Other (Specify) (soap and water )    - Dressing change frequency::    Change dressing every day    - Primary wound dressing::    Other (Specify) (Aquacel,Silvadene  cream)    - Secondary wound dressing::    Other (Specify) (dry dressing)    - Additional Orders/Instructions::    Follow nutritous diet    - Follow Up Appointment::    Return to clinic in 2 weeks  12/29/23 1002 Wound Care Routine Active Fonda Charlie Morita, MD    - Dressing change frequency::    Change dressing every day    - Secondary wound dressing::    ABD pad    - Additional Orders/Instructions::    Follow nutritous diet    - Follow Up Appointment::    Return to clinic in 1 week  12/22/23 0934 Wound Care Routine Active Gwendlyn Kimball [place], MD    - Dressing change frequency::    Change dressing every day    - Secondary wound dressing::    ABD pad    - Additional Orders/Instructions::    Follow nutritous diet    - Follow Up Appointment::    Return to clinic in 1 week  12/15/23 1022 Wound Care Routine Active Gwendlyn Kimball [place], MD    - Wound cleansing::    Other (Specify) (soap and water )    - Dressing change frequency::    Change  dressing every day    - Primary wound dressing::    Other (Specify) (Silvadene  cream,Aquacel)    - Secondary wound dressing::    Other (Specify) (dry dressing)    - Additional Orders/Instructions::    Follow nutritous diet    - Follow Up Appointment::    Return to clinic in 1 week     Inactive Orders  Date Order Priority Status Authorizing Provider  03/22/24 1113 Debridement Right;Lower;Lateral Leg Routine Completed Fonda Charlie Morita, MD  02/23/24 1221 Wound Care Routine Discontinued Fonda Charlie Morita, MD    - Dressing change frequency::    Change dressing every day    - Secondary wound dressing::    ABD pad    - Secure with::    Roll gauze    - Additional Orders/Instructions::    Follow nutritous diet    - Follow Up Appointment::    Return to clinic in 2 weeks  02/23/24 1050 Debridement Right;Lower;Lateral Leg Routine Completed Fonda Charlie Morita, MD  02/09/24 1102 Debridement Right;Lower;Lateral Leg Routine Completed Fonda Charlie Morita, MD  01/12/24 1030 Wound Care Routine Discontinued Curtistine Rush Recardo Raddle., MD    - Dressing change frequency::    Change dressing every day    - Secondary wound dressing::    ABD pad    - Additional Orders/Instructions::    Follow nutritous diet    - Follow Up Appointment::    Return to clinic in 1 week  01/07/24 1541 Wound Care Routine Discontinued Fonda Charlie Morita, MD    - Dressing change frequency::    Change dressing every day    - Secondary wound dressing::    ABD pad    - Secure with::    Roll gauze    - Secure with::    Tape    - Additional Orders/Instructions::  Follow nutritous diet    - Follow Up Appointment::    Return to clinic in 1 week  12/29/23 0931 Debridement Right;Lower;Lateral Leg Routine Completed Fonda Charlie Morita, MD     Wound 12/15/23 Heel Right (Active)  Date First Assessed/Time First Assessed: 12/15/23 0947   Pre-Existing Wound: Yes  Location: Heel  Wound Location Orientation: Right    Assessments  12/15/2023  9:50 AM 04/25/2024 11:17 AM  Wound Image      Site Assessment Red;Slough Red  Peri-Wound Assessment Clean;Dry Clean  Drainage Amount Moderate Moderate  Drainage Description Serosanguineous Serosanguineous  Wound Odor None None  Wound Length (cm) 1.5 cm 1.5 cm  Wound Width (cm) 1.5 cm 1.5 cm  Wound Surface Area (cm^2) -- 1.77 cm^2  Wound Depth (cm) 0.1 cm 0.2 cm  Wound Volume (cm^3) 0.118 cm^3 0.236 cm^3  Wound Healing % -- -100  Non-staged Wound Description Full thickness Full thickness     Active Orders  Date Order Priority Status Authorizing Provider  04/25/24 1201 Wound Care Routine Active Fonda Charlie Morita, MD    - Wound cleansing::    Other (Specify) (soap and water )    - Dressing change frequency::    Change dressing every day    - Secondary wound dressing::    Other (Specify) (dry dressing)    - Secure with::    Roll gauze    - Additional Orders/Instructions::    Follow nutritous diet    - Follow Up Appointment::    Return to clinic in 1 week  04/21/24 1121 Wound Care Routine Active Fonda Charlie Morita, MD    - Wound cleansing::    Other (Specify) (soap and water )    - Dressing change frequency::    Change dressing every day    - Primary wound dressing::    Other (Specify) (ssd cream ,xeroform,vashe moistened 1/4 packing strip)    - Secondary wound dressing::    Other (Specify) (dry dressing)    - Additional Orders/Instructions::    Follow nutritous diet    - Follow Up Appointment::    Return to clinic in 1 week  04/07/24 1251 Wound Care Routine Active Lucie Almarie Engels, DO    - Secondary wound dressing::    ABD pad    - Secure with::    Roll gauze    - Additional Orders/Instructions::    Follow nutritous diet    - Follow Up Appointment::    Return to clinic in 2 weeks  03/22/24 1150 Wound Care Routine Active Fonda Charlie Morita, MD    - Dressing change frequency::    Change dressing every day    - Secondary wound dressing::    ABD pad    -  Secure with::    Roll gauze    - Additional Orders/Instructions::    Follow nutritous diet    - Follow Up Appointment::    Return to clinic in 1 week  02/09/24 1118 Wound Care Routine Active Fonda Charlie Morita, MD    - Wound cleansing::    Other (Specify) (soap and water )    - Dressing change frequency::    Change dressing every day    - Primary wound dressing::    Other (Specify) (Silvadene  cream,Aquacel)    - Secondary wound dressing::    Other (Specify) (dry dressing)    - Additional Orders/Instructions::    Follow nutritous diet    - Follow Up Appointment::    Return to clinic in 2 weeks  01/28/24 1022  Wound Care Routine Active Jerilynn Cheryl Pouch, MD    - Wound cleansing::    Other (Specify) (soap and water )    - Dressing change frequency::    Change dressing every day    - Primary wound dressing::    Other (Specify) (Aquacel,Silvadene  cream)    - Secondary wound dressing::    Other (Specify) (dry dressing)    - Additional Orders/Instructions::    Follow nutritous diet    - Follow Up Appointment::    Return to clinic in 2 weeks  12/29/23 1002 Wound Care Routine Active Fonda Charlie Morita, MD    - Dressing change frequency::    Change dressing every day    - Secondary wound dressing::    ABD pad    - Additional Orders/Instructions::    Follow nutritous diet    - Follow Up Appointment::    Return to clinic in 1 week  12/22/23 0934 Wound Care Routine Active Gwendlyn Kimball [place], MD    - Dressing change frequency::    Change dressing every day    - Secondary wound dressing::    ABD pad    - Additional Orders/Instructions::    Follow nutritous diet    - Follow Up Appointment::    Return to clinic in 1 week  12/15/23 1022 Wound Care Routine Active Gwendlyn Kimball [place], MD    - Wound cleansing::    Other (Specify) (soap and water )    - Dressing change frequency::    Change dressing every day    - Primary wound dressing::    Other (Specify) (Silvadene  cream,Aquacel)    -  Secondary wound dressing::    Other (Specify) (dry dressing)    - Additional Orders/Instructions::    Follow nutritous diet    - Follow Up Appointment::    Return to clinic in 1 week     Inactive Orders  Date Order Priority Status Authorizing Provider  02/23/24 1221 Wound Care Routine Discontinued Fonda Charlie Morita, MD    - Dressing change frequency::    Change dressing every day    - Secondary wound dressing::    ABD pad    - Secure with::    Roll gauze    - Additional Orders/Instructions::    Follow nutritous diet    - Follow Up Appointment::    Return to clinic in 2 weeks  01/12/24 1030 Wound Care Routine Discontinued Curtistine Rush Recardo Raddle., MD    - Dressing change frequency::    Change dressing every day    - Secondary wound dressing::    ABD pad    - Additional Orders/Instructions::    Follow nutritous diet    - Follow Up Appointment::    Return to clinic in 1 week  01/12/24 1013 Debridement Right Heel Routine Completed Curtistine Rush Recardo Raddle., MD  01/07/24 1541 Wound Care Routine Discontinued Fonda Charlie Morita, MD    - Dressing change frequency::    Change dressing every day    - Secondary wound dressing::    ABD pad    - Secure with::    Roll gauze    - Secure with::    Tape    - Additional Orders/Instructions::    Follow nutritous diet    - Follow Up Appointment::    Return to clinic in 1 week  12/22/23 0929 Debridement Right Heel Routine Completed Gwendlyn Kimball [place], MD  12/15/23 1008 Debridement Right Heel Routine Completed Gwendlyn Kimball [place], MD  Wound 12/15/23 Plantar Right;Lateral (Active)  Date First Assessed/Time First Assessed: 12/15/23 0948   Pre-Existing Wound: Yes  Location: Plantar  Wound Location Orientation: Right;Lateral    Assessments 12/15/2023  9:50 AM 04/25/2024 11:18 AM  Wound Image      Site Assessment Necrotic tissue, eschar Necrotic tissue, eschar  Peri-Wound Assessment Clean;Dry Clean  Drainage Amount None Moderate  Drainage  Description -- Serosanguineous  Wound Odor Moderate None  Wound Length (cm) 0.4 cm 2 cm  Wound Width (cm) 0.4 cm 3 cm  Wound Surface Area (cm^2) 0.13 cm^2 4.71 cm^2  Wound Depth (cm) 0.1 cm 0.1 cm  Wound Volume (cm^3) 0.008 cm^3 0.314 cm^3  Wound Healing % -- -3825  Non-staged Wound Description -- Full thickness     Active Orders  Date Order Priority Status Authorizing Provider  04/25/24 1201 Wound Care Routine Active Fonda Charlie Morita, MD    - Wound cleansing::    Other (Specify) (soap and water )    - Dressing change frequency::    Change dressing every day    - Secondary wound dressing::    Other (Specify) (dry dressing)    - Secure with::    Roll gauze    - Additional Orders/Instructions::    Follow nutritous diet    - Follow Up Appointment::    Return to clinic in 1 week  04/21/24 1121 Wound Care Routine Active Fonda Charlie Morita, MD    - Wound cleansing::    Other (Specify) (soap and water )    - Dressing change frequency::    Change dressing every day    - Primary wound dressing::    Other (Specify) (ssd cream ,xeroform,vashe moistened 1/4 packing strip)    - Secondary wound dressing::    Other (Specify) (dry dressing)    - Additional Orders/Instructions::    Follow nutritous diet    - Follow Up Appointment::    Return to clinic in 1 week  04/07/24 1251 Wound Care Routine Active Lucie Almarie Engels, DO    - Secondary wound dressing::    ABD pad    - Secure with::    Roll gauze    - Additional Orders/Instructions::    Follow nutritous diet    - Follow Up Appointment::    Return to clinic in 2 weeks  03/22/24 1150 Wound Care Routine Active Fonda Charlie Morita, MD    - Dressing change frequency::    Change dressing every day    - Secondary wound dressing::    ABD pad    - Secure with::    Roll gauze    - Additional Orders/Instructions::    Follow nutritous diet    - Follow Up Appointment::    Return to clinic in 1 week  02/09/24 1118 Wound Care Routine Active  Fonda Charlie Morita, MD    - Wound cleansing::    Other (Specify) (soap and water )    - Dressing change frequency::    Change dressing every day    - Primary wound dressing::    Other (Specify) (Silvadene  cream,Aquacel)    - Secondary wound dressing::    Other (Specify) (dry dressing)    - Additional Orders/Instructions::    Follow nutritous diet    - Follow Up Appointment::    Return to clinic in 2 weeks  01/28/24 1022 Wound Care Routine Active Jerilynn Cheryl Pouch, MD    - Wound cleansing::    Other (Specify) (soap and water )    - Dressing change frequency::  Change dressing every day    - Primary wound dressing::    Other (Specify) (Aquacel,Silvadene  cream)    - Secondary wound dressing::    Other (Specify) (dry dressing)    - Additional Orders/Instructions::    Follow nutritous diet    - Follow Up Appointment::    Return to clinic in 2 weeks  12/29/23 1002 Wound Care Routine Active Fonda Charlie Morita, MD    - Dressing change frequency::    Change dressing every day    - Secondary wound dressing::    ABD pad    - Additional Orders/Instructions::    Follow nutritous diet    - Follow Up Appointment::    Return to clinic in 1 week  12/22/23 0934 Wound Care Routine Active Gwendlyn Kimball [place], MD    - Dressing change frequency::    Change dressing every day    - Secondary wound dressing::    ABD pad    - Additional Orders/Instructions::    Follow nutritous diet    - Follow Up Appointment::    Return to clinic in 1 week  12/15/23 1022 Wound Care Routine Active Gwendlyn Kimball [place], MD    - Wound cleansing::    Other (Specify) (soap and water )    - Dressing change frequency::    Change dressing every day    - Primary wound dressing::    Other (Specify) (Silvadene  cream,Aquacel)    - Secondary wound dressing::    Other (Specify) (dry dressing)    - Additional Orders/Instructions::    Follow nutritous diet    - Follow Up Appointment::    Return to clinic in 1 week     Inactive  Orders  Date Order Priority Status Authorizing Provider  04/21/24 1258 Debridement Right;Lateral Plantar Routine Completed Fonda Charlie Morita, MD  02/23/24 1221 Wound Care Routine Discontinued Fonda Charlie Morita, MD    - Dressing change frequency::    Change dressing every day    - Secondary wound dressing::    ABD pad    - Secure with::    Roll gauze    - Additional Orders/Instructions::    Follow nutritous diet    - Follow Up Appointment::    Return to clinic in 2 weeks  01/12/24 1030 Wound Care Routine Discontinued Curtistine Rush Recardo Raddle., MD    - Dressing change frequency::    Change dressing every day    - Secondary wound dressing::    ABD pad    - Additional Orders/Instructions::    Follow nutritous diet    - Follow Up Appointment::    Return to clinic in 1 week  01/12/24 1012 Debridement Right;Lateral Plantar Routine Completed Curtistine Rush Recardo Raddle., MD  01/07/24 1541 Wound Care Routine Discontinued Fonda Charlie Morita, MD    - Dressing change frequency::    Change dressing every day    - Secondary wound dressing::    ABD pad    - Secure with::    Roll gauze    - Secure with::    Tape    - Additional Orders/Instructions::    Follow nutritous diet    - Follow Up Appointment::    Return to clinic in 1 week     Wound 12/15/23 Leg Left;Posterior (Active)  Date First Assessed/Time First Assessed: 12/15/23 0948   Pre-Existing Wound: Yes  Location: Leg  Wound Location Orientation: Left;Posterior    Assessments 12/15/2023  9:50 AM 04/25/2024 11:14 AM  Wound Image  Site Assessment Slough Necrotic tissue, eschar  Peri-Wound Assessment Clean;Dry Clean  Drainage Amount Moderate Small  Drainage Description Serosanguineous Serosanguineous  Wound Odor None None  Wound Length (cm) 1.1 cm 18.5 cm  Wound Width (cm) 1 cm 13.5 cm  Wound Depth (cm) 0.2 cm 0.4 cm  Wound Volume (cm^3) 0.115 cm^3 52.307 cm^3  Wound Healing % -- -54615  Non-staged Wound Description Full thickness  Full thickness     Active Orders  Date Order Priority Status Authorizing Provider  04/25/24 1141 Debridement Left;Posterior Leg Routine Active Fonda Charlie Morita, MD  04/21/24 1121 Wound Care Routine Active Fonda Charlie Morita, MD    - Wound cleansing::    Other (Specify) (soap and water )    - Dressing change frequency::    Change dressing every day    - Primary wound dressing::    Other (Specify) (ssd cream ,xeroform,vashe moistened 1/4 packing strip)    - Secondary wound dressing::    Other (Specify) (dry dressing)    - Additional Orders/Instructions::    Follow nutritous diet    - Follow Up Appointment::    Return to clinic in 1 week  04/07/24 1251 Wound Care Routine Active Lucie Almarie Engels, DO    - Secondary wound dressing::    ABD pad    - Secure with::    Roll gauze    - Additional Orders/Instructions::    Follow nutritous diet    - Follow Up Appointment::    Return to clinic in 2 weeks  03/22/24 1150 Wound Care Routine Active Fonda Charlie Morita, MD    - Dressing change frequency::    Change dressing every day    - Secondary wound dressing::    ABD pad    - Secure with::    Roll gauze    - Additional Orders/Instructions::    Follow nutritous diet    - Follow Up Appointment::    Return to clinic in 1 week  02/09/24 1118 Wound Care Routine Active Fonda Charlie Morita, MD    - Wound cleansing::    Other (Specify) (soap and water )    - Dressing change frequency::    Change dressing every day    - Primary wound dressing::    Other (Specify) (Silvadene  cream,Aquacel)    - Secondary wound dressing::    Other (Specify) (dry dressing)    - Additional Orders/Instructions::    Follow nutritous diet    - Follow Up Appointment::    Return to clinic in 2 weeks  01/28/24 1022 Wound Care Routine Active Jerilynn Cheryl Pouch, MD    - Wound cleansing::    Other (Specify) (soap and water )    - Dressing change frequency::    Change dressing every day    - Primary wound  dressing::    Other (Specify) (Aquacel,Silvadene  cream)    - Secondary wound dressing::    Other (Specify) (dry dressing)    - Additional Orders/Instructions::    Follow nutritous diet    - Follow Up Appointment::    Return to clinic in 2 weeks  12/29/23 1002 Wound Care Routine Active Fonda Charlie Morita, MD    - Dressing change frequency::    Change dressing every day    - Secondary wound dressing::    ABD pad    - Additional Orders/Instructions::    Follow nutritous diet    - Follow Up Appointment::    Return to clinic in 1 week  12/22/23 0934 Wound Care Routine Active Gwendlyn Lav  Alexa, MD    - Dressing change frequency::    Change dressing every day    - Secondary wound dressing::    ABD pad    - Additional Orders/Instructions::    Follow nutritous diet    - Follow Up Appointment::    Return to clinic in 1 week  12/15/23 1022 Wound Care Routine Active Gwendlyn Kimball Alexa, MD    - Wound cleansing::    Other (Specify) (soap and water )    - Dressing change frequency::    Change dressing every day    - Primary wound dressing::    Other (Specify) (Silvadene  cream,Aquacel)    - Secondary wound dressing::    Other (Specify) (dry dressing)    - Additional Orders/Instructions::    Follow nutritous diet    - Follow Up Appointment::    Return to clinic in 1 week     Inactive Orders  Date Order Priority Status Authorizing Provider  03/22/24 1120 Debridement Left;Posterior Leg Routine Completed Fonda Charlie Morita, MD  02/23/24 1221 Wound Care Routine Discontinued Fonda Charlie Morita, MD    - Dressing change frequency::    Change dressing every day    - Secondary wound dressing::    ABD pad    - Secure with::    Roll gauze    - Additional Orders/Instructions::    Follow nutritous diet    - Follow Up Appointment::    Return to clinic in 2 weeks  01/12/24 1030 Wound Care Routine Discontinued Curtistine Rush Recardo Raddle., MD    - Dressing change frequency::    Change dressing every day     - Secondary wound dressing::    ABD pad    - Additional Orders/Instructions::    Follow nutritous diet    - Follow Up Appointment::    Return to clinic in 1 week  01/07/24 1541 Wound Care Routine Discontinued Fonda Charlie Morita, MD    - Dressing change frequency::    Change dressing every day    - Secondary wound dressing::    ABD pad    - Secure with::    Roll gauze    - Secure with::    Tape    - Additional Orders/Instructions::    Follow nutritous diet    - Follow Up Appointment::    Return to clinic in 1 week  12/15/23 1011 Debridement Left;Posterior Tibial Routine Completed Gwendlyn Kimball Alexa, MD     Wound 01/26/24 Pretibial Left;Lateral (Active)  Date First Assessed/Time First Assessed: 01/26/24 1355   Location: Pretibial  Wound Location Orientation: Left;Lateral    Assessments 01/26/2024  1:56 PM 04/25/2024 11:10 AM  Wound Image      Site Assessment Necrotic tissue, eschar;Necrotic tissue, slough Necrotic tissue, eschar  Peri-Wound Assessment Clean;Dry Clean  Drainage Amount Moderate Moderate  Drainage Description Serosanguineous Serosanguineous  Wound Odor None None  Treatments Cleansed --  Wound Length (cm) 10 cm 14.5 cm  Wound Width (cm) 5.5 cm 6.5 cm  Wound Surface Area (cm^2) 43.2 cm^2 --  Wound Depth (cm) 0.1 cm 0.4 cm  Wound Volume (cm^3) 2.88 cm^3 19.74 cm^3  Wound Healing % -- -585  Non-staged Wound Description Full thickness Full thickness     Active Orders  Date Order Priority Status Authorizing Provider  04/25/24 1201 Wound Care Routine Active Fonda Charlie Morita, MD    - Wound cleansing::    Other (Specify) (soap and water )    - Dressing change frequency::    Change dressing  every day    - Secondary wound dressing::    Other (Specify) (dry dressing)    - Secure with::    Roll gauze    - Additional Orders/Instructions::    Follow nutritous diet    - Follow Up Appointment::    Return to clinic in 1 week  04/25/24 1136 Debridement Left;Lateral  Pretibial Routine Active Fonda Charlie Morita, MD  04/21/24 1121 Wound Care Routine Active Fonda Charlie Morita, MD    - Wound cleansing::    Other (Specify) (soap and water )    - Dressing change frequency::    Change dressing every day    - Primary wound dressing::    Other (Specify) (ssd cream ,xeroform,vashe moistened 1/4 packing strip)    - Secondary wound dressing::    Other (Specify) (dry dressing)    - Additional Orders/Instructions::    Follow nutritous diet    - Follow Up Appointment::    Return to clinic in 1 week  04/07/24 1251 Wound Care Routine Active Lucie Almarie Engels, DO    - Secondary wound dressing::    ABD pad    - Secure with::    Roll gauze    - Additional Orders/Instructions::    Follow nutritous diet    - Follow Up Appointment::    Return to clinic in 2 weeks  03/22/24 1150 Wound Care Routine Active Fonda Charlie Morita, MD    - Dressing change frequency::    Change dressing every day    - Secondary wound dressing::    ABD pad    - Secure with::    Roll gauze    - Additional Orders/Instructions::    Follow nutritous diet    - Follow Up Appointment::    Return to clinic in 1 week  02/09/24 1118 Wound Care Routine Active Fonda Charlie Morita, MD    - Wound cleansing::    Other (Specify) (soap and water )    - Dressing change frequency::    Change dressing every day    - Primary wound dressing::    Other (Specify) (Silvadene  cream,Aquacel)    - Secondary wound dressing::    Other (Specify) (dry dressing)    - Additional Orders/Instructions::    Follow nutritous diet    - Follow Up Appointment::    Return to clinic in 2 weeks  01/28/24 1022 Wound Care Routine Active Jerilynn Cheryl Pouch, MD    - Wound cleansing::    Other (Specify) (soap and water )    - Dressing change frequency::    Change dressing every day    - Primary wound dressing::    Other (Specify) (Aquacel,Silvadene  cream)    - Secondary wound dressing::    Other (Specify) (dry dressing)    -  Additional Orders/Instructions::    Follow nutritous diet    - Follow Up Appointment::    Return to clinic in 2 weeks     Inactive Orders  Date Order Priority Status Authorizing Provider  03/22/24 1116 Debridement Left;Lateral Pretibial Routine Completed Fonda Charlie Morita, MD  02/23/24 1221 Wound Care Routine Discontinued Fonda Charlie Morita, MD    - Dressing change frequency::    Change dressing every day    - Secondary wound dressing::    ABD pad    - Secure with::    Roll gauze    - Additional Orders/Instructions::    Follow nutritous diet    - Follow Up Appointment::    Return to clinic in 2 weeks  Wound 02/23/24 Pressure Injury Foot Anterior;Left (Active)  Date First Assessed/Time First Assessed: 02/23/24 1035   Pre-Existing Wound: Yes  Primary Wound Type: Pressure Injury  Location: Foot  Wound Location Orientation: Anterior;Left    Assessments 02/23/2024 10:35 AM 04/25/2024 11:11 AM  Wound Image      Site Assessment Red;Slough Necrotic tissue, eschar  Peri-Wound Assessment Clean Clean  Pressure Injury Stage -- Unstageable  Drainage Amount Small Small  Drainage Description Serosanguineous Serosanguineous  Wound Odor None None  Wound Length (cm) 0.8 cm 1 cm  Wound Width (cm) 1 cm 1 cm  Wound Surface Area (cm^2) 0.63 cm^2 0.79 cm^2  Wound Depth (cm) 0.1 cm 0.1 cm  Wound Volume (cm^3) 0.042 cm^3 0.052 cm^3  Wound Healing % -- -24  Non-staged Wound Description Full thickness Full thickness     Active Orders  Date Order Priority Status Authorizing Provider  04/21/24 1121 Wound Care Routine Active Fonda Charlie Morita, MD    - Wound cleansing::    Other (Specify) (soap and water )    - Dressing change frequency::    Change dressing every day    - Primary wound dressing::    Other (Specify) (ssd cream ,xeroform,vashe moistened 1/4 packing strip)    - Secondary wound dressing::    Other (Specify) (dry dressing)    - Additional Orders/Instructions::    Follow nutritous  diet    - Follow Up Appointment::    Return to clinic in 1 week  04/07/24 1251 Wound Care Routine Active Lucie Almarie Engels, DO    - Secondary wound dressing::    ABD pad    - Secure with::    Roll gauze    - Additional Orders/Instructions::    Follow nutritous diet    - Follow Up Appointment::    Return to clinic in 2 weeks  03/22/24 1150 Wound Care Routine Active Fonda Charlie Morita, MD    - Dressing change frequency::    Change dressing every day    - Secondary wound dressing::    ABD pad    - Secure with::    Roll gauze    - Additional Orders/Instructions::    Follow nutritous diet    - Follow Up Appointment::    Return to clinic in 1 week     Inactive Orders  Date Order Priority Status Authorizing Provider  02/23/24 1221 Wound Care Routine Discontinued Fonda Charlie Morita, MD    - Dressing change frequency::    Change dressing every day    - Secondary wound dressing::    ABD pad    - Secure with::    Roll gauze    - Additional Orders/Instructions::    Follow nutritous diet    - Follow Up Appointment::    Return to clinic in 2 weeks     Wound 02/23/24 Venous Ulcer Pretibial Right;Medial (Active)  Date First Assessed/Time First Assessed: 02/23/24 1035   Pre-Existing Wound: Yes  Primary Wound Type: Venous Ulcer  Location: Pretibial  Wound Location Orientation: Right;Medial    Assessments 02/23/2024 10:36 AM 04/25/2024 11:15 AM  Wound Image      Site Assessment Necrotic tissue, eschar Necrotic tissue, eschar;Slough  Peri-Wound Assessment Clean Clean  Drainage Amount Moderate Moderate  Drainage Description Serosanguineous;Green Serosanguineous  Wound Odor None None  Wound Length (cm) 8.4 cm 6.5 cm  Wound Width (cm) 4.5 cm 7 cm  Wound Surface Area (cm^2) 29.69 cm^2 --  Wound Depth (cm) 0.1 cm 0.4 cm  Wound Volume (cm^3) 1.979  cm^3 9.529 cm^3  Wound Healing % -- -382  Non-staged Wound Description Full thickness Full thickness     Active Orders  Date Order Priority  Status Authorizing Provider  04/25/24 1135 Debridement Venous Ulcer Right;Medial Pretibial Routine Active Fonda Charlie Morita, MD  04/21/24 1121 Wound Care Routine Active Fonda Charlie Morita, MD    - Wound cleansing::    Other (Specify) (soap and water )    - Dressing change frequency::    Change dressing every day    - Primary wound dressing::    Other (Specify) (ssd cream ,xeroform,vashe moistened 1/4 packing strip)    - Secondary wound dressing::    Other (Specify) (dry dressing)    - Additional Orders/Instructions::    Follow nutritous diet    - Follow Up Appointment::    Return to clinic in 1 week  04/07/24 1251 Wound Care Routine Active Lucie Almarie Engels, DO    - Secondary wound dressing::    ABD pad    - Secure with::    Roll gauze    - Additional Orders/Instructions::    Follow nutritous diet    - Follow Up Appointment::    Return to clinic in 2 weeks  03/22/24 1150 Wound Care Routine Active Fonda Charlie Morita, MD    - Dressing change frequency::    Change dressing every day    - Secondary wound dressing::    ABD pad    - Secure with::    Roll gauze    - Additional Orders/Instructions::    Follow nutritous diet    - Follow Up Appointment::    Return to clinic in 1 week     Inactive Orders  Date Order Priority Status Authorizing Provider  04/21/24 1259 Debridement Venous Ulcer Right;Medial Pretibial Routine Completed Fonda Charlie Morita, MD  02/23/24 1221 Wound Care Routine Discontinued Fonda Charlie Morita, MD    - Dressing change frequency::    Change dressing every day    - Secondary wound dressing::    ABD pad    - Secure with::    Roll gauze    - Additional Orders/Instructions::    Follow nutritous diet    - Follow Up Appointment::    Return to clinic in 2 weeks     Wound 03/22/24 Venous Ulcer Knee Anterior;Left (Active)  Date First Assessed/Time First Assessed: 03/22/24 1059   Pre-Existing Wound: Yes  Primary Wound Type: Venous Ulcer  Location: Knee   Wound Location Orientation: Anterior;Left    Assessments 03/22/2024 10:59 AM 04/25/2024 11:10 AM  Wound Image      Site Assessment Necrotic tissue, eschar;Slough Dry  Peri-Wound Assessment Clean Clean  Drainage Amount Moderate None  Drainage Description Serosanguineous --  Wound Odor None None  Wound Length (cm) 3.5 cm 0.1 cm  Wound Width (cm) 3.5 cm 0.1 cm  Wound Surface Area (cm^2) 9.62 cm^2 0.01 cm^2  Wound Depth (cm) 0.1 cm 0.1 cm  Wound Volume (cm^3) 0.641 cm^3 0.001 cm^3  Wound Healing % -- 100  Non-staged Wound Description Full thickness --     Active Orders  Date Order Priority Status Authorizing Provider  04/21/24 1121 Wound Care Routine Active Fonda Charlie Morita, MD    - Wound cleansing::    Other (Specify) (soap and water )    - Dressing change frequency::    Change dressing every day    - Primary wound dressing::    Other (Specify) (ssd cream ,xeroform,vashe moistened 1/4 packing strip)    - Secondary wound dressing::  Other (Specify) (dry dressing)    - Additional Orders/Instructions::    Follow nutritous diet    - Follow Up Appointment::    Return to clinic in 1 week  04/07/24 1251 Wound Care Routine Active Lucie Almarie Engels, DO    - Secondary wound dressing::    ABD pad    - Secure with::    Roll gauze    - Additional Orders/Instructions::    Follow nutritous diet    - Follow Up Appointment::    Return to clinic in 2 weeks  03/22/24 1150 Wound Care Routine Active Fonda Charlie Morita, MD    - Dressing change frequency::    Change dressing every day    - Secondary wound dressing::    ABD pad    - Secure with::    Roll gauze    - Additional Orders/Instructions::    Follow nutritous diet    - Follow Up Appointment::    Return to clinic in 1 week     Wound 04/25/24 Pressure Injury Plantar Left;Lateral;Proximal (Active)  Date First Assessed/Time First Assessed: 04/25/24 1113   Pre-Existing Wound: Yes  Primary Wound Type: Pressure Injury  Location:  Plantar  Wound Location Orientation: Left;Lateral;Proximal    Assessments 04/25/2024 11:14 AM  Wound Image    Site Assessment Necrotic tissue, eschar  Peri-Wound Assessment Clean  Pressure Injury Stage Unstageable  Drainage Amount Moderate  Drainage Description Serosanguineous  Wound Odor None  Wound Length (cm) 1.5 cm  Wound Width (cm) 2 cm  Wound Surface Area (cm^2) 2.36 cm^2  Wound Depth (cm) 0.1 cm  Wound Volume (cm^3) 0.157 cm^3  Non-staged Wound Description Full thickness     No associated orders.     Wound 04/25/24 Pressure Injury Foot Anterior;Right;Lateral (Active)  Date First Assessed/Time First Assessed: 04/25/24 1119   Pre-Existing Wound: Yes  Primary Wound Type: Pressure Injury  Location: Foot  Wound Location Orientation: Anterior;Right;Lateral    Assessments 04/25/2024 11:20 AM  Wound Image    Site Assessment Slough  Peri-Wound Assessment Clean  Drainage Amount Moderate  Drainage Description Serosanguineous  Wound Odor None  Wound Length (cm) 0.9 cm  Wound Width (cm) 0.6 cm  Wound Surface Area (cm^2) 0.42 cm^2  Wound Depth (cm) 0.3 cm  Wound Volume (cm^3) 0.085 cm^3  Non-staged Wound Description Full thickness     No associated orders.    Assessment Encounter Diagnoses  Name Primary?   Venous stasis ulcers of both lower extremities    (CMD) Yes   Diabetic ulcer of right heel associated with type 2 diabetes mellitus, with fat layer exposed    (CMD)    Diabetic ulcer of other part of right foot associated with diabetes mellitus due to underlying condition, with fat layer exposed (CMD)    Diabetic ulcer of left midfoot associated with type 2 diabetes mellitus, with fat layer exposed    (CMD)       Here for evaluation of numerous wounds. He has been in a wheelchair / non-ambulatory as a result of several hospitalizations.. Majority of the wounds on the feet are pressure related. Biopsy confirmed calciphylaxis of lower extremities.  He is currently on  Sodium thiosulfate  at his dialysis center [facility]) and taking 300 mcg of vitamin K2 daily.   Pain - has improved. No longer seeing [facility] due to wait times.   Legs - showing progress, eschar is loosening. He remains grossly edematous. They are working on removing fluid at dialysis. May  pursue wraps in the future.   Feet wounds  - he is wearing regular shoes and the wheelchair foot plate is causing pressure. He is still not using offloading boots. We discussed today just not using foot plate and floating heels. He will do this.   Abscess on R foot - drained last week. Culture with pseudomonas and MRSA. He is much improved on doxy. Will add cipro. Of note, he is not on zanaflex . We discussed holding trazadone for the week. He will do this.    PLAN: Dressings:  - Apply SSD, xeroform to eschar areas on leg.  - pack vashe dressings to left and R feet - Start doxy, any worsening head to hospital - Check XR - use offloading boots 24/7, float heels in wheelchair  - Tubigrip (easier to apply than edemawear) Elevate legs High protein diet  Continue sodium thiosulfate . Taking vitamin k2   Pain - keep f/u with [facility]  F/u 2 week sooner if needed     Orders Placed This Encounter  Procedures   Debridement Right;Lower;Lateral Leg    This order was created via procedure documentation   Debridement Venous Ulcer Right;Medial Pretibial    This order was created via procedure documentation   Debridement Left;Lateral Pretibial    This order was created via procedure documentation   Debridement Left;Posterior Leg    This order was created via procedure documentation   Wound Care    Bilateral Legs Wound: Apply SSD cream  at home, Xeroform to eschar, dry dressing, roll gauze, and tubigrip. Change daily   Right Heel and Plantar: Apply Vashe  moistened 1/4 packing strip, dry dressing Change Daily.    Wound cleansing::   Other (Specify)              soap and water     Dressing change frequency::   Change dressing every day    Secondary wound dressing::   Other (Specify)             dry dressing    Secure with::   Roll gauze    Additional Orders/Instructions::   Follow nutritous diet    Follow Up Appointment::   Return to clinic in 1 week    Fonda Charlie Morita 04/25/2024   Medical History[1]           [1] Past Medical History: Diagnosis Date   Abscess of skin and subcutaneous tissue    Acute blood loss anemia 08/14/2023   Acute cystitis with hematuria 08/07/2023   C. difficile diarrhea 08/07/2023   Gross hematuria 08/14/2023   Hypomagnesemia 08/11/2023   Leukocytosis 08/05/2023   Nephrolithiasis    SCC (squamous cell carcinoma)    Tricompartment osteoarthritis of right knee 11/04/2017

## 2024-04-25 NOTE — Progress Notes (Signed)
 Dressing applied per providers orders. Patient tolerated procedure well. Patient given AVS. Packing placed: Yes. If yes, quantity 1.

## 2024-04-26 ENCOUNTER — Inpatient Hospital Stay (HOSPITAL_COMMUNITY)

## 2024-04-26 DIAGNOSIS — I469 Cardiac arrest, cause unspecified: Secondary | ICD-10-CM

## 2024-04-26 DIAGNOSIS — Z992 Dependence on renal dialysis: Secondary | ICD-10-CM

## 2024-04-26 DIAGNOSIS — J9601 Acute respiratory failure with hypoxia: Secondary | ICD-10-CM

## 2024-04-26 DIAGNOSIS — A419 Sepsis, unspecified organism: Secondary | ICD-10-CM

## 2024-04-26 DIAGNOSIS — R6521 Severe sepsis with septic shock: Secondary | ICD-10-CM

## 2024-04-26 DIAGNOSIS — E1122 Type 2 diabetes mellitus with diabetic chronic kidney disease: Secondary | ICD-10-CM

## 2024-04-26 DIAGNOSIS — N186 End stage renal disease: Secondary | ICD-10-CM

## 2024-04-26 DIAGNOSIS — I214 Non-ST elevation (NSTEMI) myocardial infarction: Secondary | ICD-10-CM

## 2024-04-26 DIAGNOSIS — R569 Unspecified convulsions: Secondary | ICD-10-CM

## 2024-04-26 DIAGNOSIS — E876 Hypokalemia: Principal | ICD-10-CM

## 2024-04-26 DIAGNOSIS — J690 Pneumonitis due to inhalation of food and vomit: Secondary | ICD-10-CM

## 2024-04-26 DIAGNOSIS — I129 Hypertensive chronic kidney disease with stage 1 through stage 4 chronic kidney disease, or unspecified chronic kidney disease: Secondary | ICD-10-CM

## 2024-04-26 DIAGNOSIS — G253 Myoclonus: Secondary | ICD-10-CM

## 2024-04-26 DIAGNOSIS — E872 Acidosis, unspecified: Secondary | ICD-10-CM

## 2024-04-26 LAB — BASIC METABOLIC PANEL WITH GFR
Anion gap: 22 — ABNORMAL HIGH (ref 5–15)
BUN: 22 mg/dL (ref 8–23)
CO2: 20 mmol/L — ABNORMAL LOW (ref 22–32)
Calcium: 9 mg/dL (ref 8.9–10.3)
Chloride: 95 mmol/L — ABNORMAL LOW (ref 98–111)
Creatinine, Ser: 3.12 mg/dL — ABNORMAL HIGH (ref 0.61–1.24)
GFR, Estimated: 21 mL/min — ABNORMAL LOW
Glucose, Bld: 149 mg/dL — ABNORMAL HIGH (ref 70–99)
Potassium: 3.7 mmol/L (ref 3.5–5.1)
Sodium: 137 mmol/L (ref 135–145)

## 2024-04-26 LAB — I-STAT ARTERIAL BLOOD GAS, ED
Acid-base deficit: 3 mmol/L — ABNORMAL HIGH (ref 0.0–2.0)
Bicarbonate: 24.5 mmol/L (ref 20.0–28.0)
Calcium, Ion: 1.19 mmol/L (ref 1.15–1.40)
HCT: 32 % — ABNORMAL LOW (ref 39.0–52.0)
Hemoglobin: 10.9 g/dL — ABNORMAL LOW (ref 13.0–17.0)
O2 Saturation: 100 %
Patient temperature: 93.4
Potassium: 2.2 mmol/L — CL (ref 3.5–5.1)
Sodium: 137 mmol/L (ref 135–145)
TCO2: 26 mmol/L (ref 22–32)
pCO2 arterial: 50.3 mmHg — ABNORMAL HIGH (ref 32–48)
pH, Arterial: 7.28 — ABNORMAL LOW (ref 7.35–7.45)
pO2, Arterial: 431 mmHg — ABNORMAL HIGH (ref 83–108)

## 2024-04-26 LAB — POCT I-STAT 7, (LYTES, BLD GAS, ICA,H+H)
Acid-base deficit: 1 mmol/L (ref 0.0–2.0)
Bicarbonate: 23.9 mmol/L (ref 20.0–28.0)
Calcium, Ion: 1.17 mmol/L (ref 1.15–1.40)
HCT: 30 % — ABNORMAL LOW (ref 39.0–52.0)
Hemoglobin: 10.2 g/dL — ABNORMAL LOW (ref 13.0–17.0)
O2 Saturation: 98 %
Patient temperature: 38.2
Potassium: 4.3 mmol/L (ref 3.5–5.1)
Sodium: 134 mmol/L — ABNORMAL LOW (ref 135–145)
TCO2: 25 mmol/L (ref 22–32)
pCO2 arterial: 42.2 mmHg (ref 32–48)
pH, Arterial: 7.366 (ref 7.35–7.45)
pO2, Arterial: 120 mmHg — ABNORMAL HIGH (ref 83–108)

## 2024-04-26 LAB — GLUCOSE, CAPILLARY
Glucose-Capillary: 194 mg/dL — ABNORMAL HIGH (ref 70–99)
Glucose-Capillary: 168 mg/dL — ABNORMAL HIGH (ref 70–99)
Glucose-Capillary: 138 mg/dL — ABNORMAL HIGH (ref 70–99)
Glucose-Capillary: 107 mg/dL — ABNORMAL HIGH (ref 70–99)
Glucose-Capillary: 105 mg/dL — ABNORMAL HIGH (ref 70–99)
Glucose-Capillary: 105 mg/dL — ABNORMAL HIGH (ref 70–99)

## 2024-04-26 LAB — RESP PANEL BY RT-PCR (RSV, FLU A&B, COVID)  RVPGX2
Influenza A by PCR: NEGATIVE
Influenza B by PCR: NEGATIVE
Resp Syncytial Virus by PCR: NEGATIVE
SARS Coronavirus 2 by RT PCR: NEGATIVE

## 2024-04-26 LAB — ECHOCARDIOGRAM COMPLETE
Area-P 1/2: 3.45 cm2
Height: 68 in
S' Lateral: 4.2 cm
Weight: 3552.05 [oz_av]

## 2024-04-26 LAB — MAGNESIUM
Magnesium: 1.7 mg/dL (ref 1.7–2.4)
Magnesium: 2 mg/dL (ref 1.7–2.4)

## 2024-04-26 LAB — CG4 I-STAT (LACTIC ACID)
Lactic Acid, Venous: 4 mmol/L (ref 0.5–1.9)
Lactic Acid, Venous: 3.8 mmol/L (ref 0.5–1.9)
Lactic Acid, Venous: 3.8 mmol/L (ref 0.5–1.9)
Lactic Acid, Venous: 1.7 mmol/L (ref 0.5–1.9)

## 2024-04-26 LAB — COMPREHENSIVE METABOLIC PANEL WITH GFR
ALT: 14 U/L (ref 0–44)
AST: 21 U/L (ref 15–41)
Albumin: 2.6 g/dL — ABNORMAL LOW (ref 3.5–5.0)
Alkaline Phosphatase: 135 U/L — ABNORMAL HIGH (ref 38–126)
Anion gap: 22 — ABNORMAL HIGH (ref 5–15)
BUN: 20 mg/dL (ref 8–23)
CO2: 21 mmol/L — ABNORMAL LOW (ref 22–32)
Calcium: 8.9 mg/dL (ref 8.9–10.3)
Chloride: 95 mmol/L — ABNORMAL LOW (ref 98–111)
Creatinine, Ser: 3.08 mg/dL — ABNORMAL HIGH (ref 0.61–1.24)
GFR, Estimated: 21 mL/min — ABNORMAL LOW
Glucose, Bld: 195 mg/dL — ABNORMAL HIGH (ref 70–99)
Potassium: 2.4 mmol/L — CL (ref 3.5–5.1)
Sodium: 138 mmol/L (ref 135–145)
Total Bilirubin: 0.5 mg/dL (ref 0.0–1.2)
Total Protein: 5.2 g/dL — ABNORMAL LOW (ref 6.5–8.1)

## 2024-04-26 LAB — CBC
HCT: 29.1 % — ABNORMAL LOW (ref 39.0–52.0)
Hemoglobin: 8.8 g/dL — ABNORMAL LOW (ref 13.0–17.0)
MCH: 27.8 pg (ref 26.0–34.0)
MCHC: 30.2 g/dL (ref 30.0–36.0)
MCV: 92.1 fL (ref 80.0–100.0)
Platelets: 201 K/uL (ref 150–400)
RBC: 3.16 MIL/uL — ABNORMAL LOW (ref 4.22–5.81)
RDW: 15.9 % — ABNORMAL HIGH (ref 11.5–15.5)
WBC: 46.9 K/uL — ABNORMAL HIGH (ref 4.0–10.5)
nRBC: 0 % (ref 0.0–0.2)

## 2024-04-26 LAB — C DIFFICILE QUICK SCREEN W PCR REFLEX
C Diff antigen: NEGATIVE
C Diff interpretation: NOT DETECTED
C Diff toxin: NEGATIVE

## 2024-04-26 LAB — MRSA NEXT GEN BY PCR, NASAL: MRSA by PCR Next Gen: NOT DETECTED

## 2024-04-26 LAB — PHOSPHORUS: Phosphorus: 3.4 mg/dL (ref 2.5–4.6)

## 2024-04-26 LAB — LACTIC ACID, PLASMA: Lactic Acid, Venous: 4.2 mmol/L (ref 0.5–1.9)

## 2024-04-26 LAB — TRIGLYCERIDES: Triglycerides: 67 mg/dL

## 2024-04-26 LAB — TROPONIN T, HIGH SENSITIVITY: Troponin T High Sensitivity: 189 ng/L (ref 0–19)

## 2024-04-26 MED ORDER — ORAL CARE MOUTH RINSE
15.0000 mL | OROMUCOSAL | Status: DC
  Administered 2024-04-26 – 2024-04-29 (×36): 15 mL via OROMUCOSAL

## 2024-04-26 MED ORDER — POTASSIUM CHLORIDE 10 MEQ/50ML IV SOLN
INTRAVENOUS | Status: AC
  Filled 2024-04-26: qty 50

## 2024-04-26 MED ORDER — IOHEXOL 350 MG/ML SOLN
75.0000 mL | Freq: Once | INTRAVENOUS | Status: AC | PRN
  Administered 2024-04-26: 75 mL via INTRAVENOUS

## 2024-04-26 MED ORDER — SODIUM CHLORIDE 0.9 % IV SOLN
2.0000 g | INTRAVENOUS | Status: DC
  Filled 2024-04-26: qty 20

## 2024-04-26 MED ORDER — PIPERACILLIN-TAZOBACTAM 3.375 G IVPB: 3.3750 g | Freq: Three times a day (TID) | INTRAVENOUS | Status: DC

## 2024-04-26 MED ORDER — POTASSIUM CHLORIDE 10 MEQ/100ML IV SOLN
10.0000 meq | INTRAVENOUS | Status: AC
  Administered 2024-04-26 (×6): 10 meq via INTRAVENOUS
  Filled 2024-04-26 (×5): qty 100

## 2024-04-26 MED ORDER — PIPERACILLIN-TAZOBACTAM IN DEX 2-0.25 GM/50ML IV SOLN
2.2500 g | Freq: Three times a day (TID) | INTRAVENOUS | Status: DC
  Administered 2024-04-26 – 2024-04-28 (×6): 2.25 g via INTRAVENOUS
  Filled 2024-04-26 (×7): qty 50

## 2024-04-26 MED ORDER — MIDAZOLAM-SODIUM CHLORIDE 100-0.9 MG/100ML-% IV SOLN
0.0000 mg/h | INTRAVENOUS | Status: DC
  Administered 2024-04-26: 2 mg/h via INTRAVENOUS
  Administered 2024-04-27 – 2024-04-28 (×2): 4 mg/h via INTRAVENOUS
  Filled 2024-04-26 (×3): qty 100

## 2024-04-26 MED ORDER — VANCOMYCIN HCL 2000 MG/400ML IV SOLN
2000.0000 mg | Freq: Once | INTRAVENOUS | Status: AC
  Administered 2024-04-26: 2000 mg via INTRAVENOUS
  Filled 2024-04-26: qty 400

## 2024-04-26 MED ORDER — POTASSIUM CHLORIDE 10 MEQ/100ML IV SOLN
INTRAVENOUS | Status: AC
  Filled 2024-04-26: qty 100

## 2024-04-26 MED ORDER — MAGNESIUM SULFATE 2 GM/50ML IV SOLN
2.0000 g | Freq: Once | INTRAVENOUS | Status: AC
  Administered 2024-04-26: 2 g via INTRAVENOUS
  Filled 2024-04-26: qty 50

## 2024-04-26 MED ORDER — LEVETIRACETAM (KEPPRA) 500 MG/5 ML ADULT IV PUSH
500.0000 mg | Freq: Two times a day (BID) | INTRAVENOUS | Status: DC
  Administered 2024-04-26 – 2024-04-28 (×5): 500 mg via INTRAVENOUS
  Filled 2024-04-26 (×5): qty 5

## 2024-04-26 MED ORDER — ACETAMINOPHEN 325 MG PO TABS
650.0000 mg | ORAL_TABLET | Freq: Four times a day (QID) | ORAL | Status: DC | PRN
  Administered 2024-04-26: 650 mg
  Filled 2024-04-26: qty 2

## 2024-04-26 MED ORDER — COLLAGENASE 250 UNIT/GM EX OINT
TOPICAL_OINTMENT | Freq: Every day | CUTANEOUS | Status: DC
  Filled 2024-04-26: qty 30

## 2024-04-26 MED ORDER — POTASSIUM CHLORIDE 10 MEQ/100ML IV SOLN
10.0000 meq | INTRAVENOUS | Status: DC
  Administered 2024-04-26: 10 meq via INTRAVENOUS
  Filled 2024-04-26: qty 100

## 2024-04-26 MED ORDER — VANCOMYCIN VARIABLE DOSE PER UNSTABLE RENAL FUNCTION (PHARMACIST DOSING): Status: DC

## 2024-04-26 MED ORDER — ORAL CARE MOUTH RINSE: 15.0000 mL | OROMUCOSAL | Status: DC | PRN

## 2024-04-26 MED ORDER — POTASSIUM CHLORIDE 20 MEQ PO PACK
40.0000 meq | PACK | ORAL | Status: AC
  Administered 2024-04-26 (×3): 40 meq
  Filled 2024-04-26 (×3): qty 2

## 2024-04-26 MED ORDER — PERFLUTREN LIPID MICROSPHERE
1.0000 mL | INTRAVENOUS | Status: AC | PRN
  Administered 2024-04-26: 3 mL via INTRAVENOUS

## 2024-04-26 NOTE — Progress Notes (Signed)
 Initial Nutrition Assessment  DOCUMENTATION CODES:   Not applicable  INTERVENTION:   Discussed nutrition poc with PCCM; no plan for TF initiation today.   Tube Feeding Recommendations if initiated:  Vital 1.5 at 50 ml/hr Pro-Source TF20 60 mL BID TF provides 1960 kcals, 121 g of protein and 912 mL of free water   Add Renal MVI daily  NUTRITION DIAGNOSIS:   Inadequate oral intake related to acute illness as evidenced by NPO status.  GOAL:   Patient will meet greater than or equal to 90% of their needs  MONITOR:   Vent status, Labs, Weight trends  REASON FOR ASSESSMENT:   Ventilator    ASSESSMENT:   73 yo male admitted post PEA arrest likely secondary to hypokalemia. PMH includes DM, ESRD on iHD, HTN, HLD, chronic LE wounds, hx C.Diff  12/22 Admitted, Intubated 12/23 CT A/P: Negative for PE, dilated ascending aorta measuring 42 mm, diffuse body wall edema; EEG: +myoclonic seizures every few seconds with evidence of anoxic/hypoxic brain injury  Currently on vent support, +myoclonic seizures. Propofol  for burst suppression EEG: myoclonic seizures every few seconds with profound diffuse encephalopathy; EEG pattern suggestive of anoxic/hypoxic brain injury per Dr. Shelton Epinephrine  at 5 mcg/min  OG placed yesterday, tip in stomach per imaging, gastric distention also noted.   Last iHD on 12/21, noted plan for iHD vs CRRT tomorrow Outpatient EDW 97.4 kg Current wt: 100.7 kg  Unable to obtain nutrition history from patient at this time  Labs: Potassium 3.7 (wdl) Sodium 137 (wdl) TG 67 BUN 22 Creatinine 3.12 Corrected calcium  10.0 (serum calcium  8.9, albumin  2.6) Phosphorus 3.4 (wdl) Magnesium  1.7 (wdl)  Meds: SS novolog  Keppra  Protonix  KCl Mag sulfate  Pt with multiple skin issues; noted images in chart. WOC RN consulted today.  Question if ulcerations on LE might be calciphylaxis-noted receiving sodium thiosulfate  at [facility] on iHD with chronically  elevated phosphorus, no iPTH available  Wound type per WOC RN: R/L buttocks deep tissue pressure injury: deep purple maroon discoloration with intact skin, intact periwound Stage 3 coccyx pressure injury; 100% red; intact periwound 3.   Left proximal and distal foot full thickness wounds in setting of DM II; 100 % black, dry; periwound intact 4. Right proximal foot full thickness wound in setting of DM II; 90% yellow 10 % red, periwound intact 5. Right heel proximal and distal unstageable pressure injury; proximal wound- 20% red 80% yellow, distal wound- 100% black eschar, intact periwound 6. LLE proximal and posterior full thickness wound; 80% black eschar 20% yellow slough, intact periwound 7. RLE proximal and medial full thickness wound; 100% yellow slough; intact periwound 8. L elbow grade 2 skin tear: 100% red, periwound ecchymosis 9. R hand grade 2 skin tear; 100% red, periwound ecchymosis 10.R elbow grade 2 skin tear; 100% red, periwound ecchymosis 11. L ischium stage 3 pressure injury: 100 % red, intact periwound   NUTRITION - FOCUSED PHYSICAL EXAM:  Unable to assess  Diet Order:   Diet Order             Diet NPO time specified  Diet effective now                   EDUCATION NEEDS:   Not appropriate for education at this time  Skin:  Skin Assessment: Skin Integrity Issues: Skin Integrity Issues:: Other (Comment), DTI, Stage III, Diabetic Ulcer, Unstageable DTI: R/L buttocks Stage III: coccyx, L ischium Unstageable: R. Heel Diabetic Ulcer: R foot Other: BLE with full thickeness  wounds: ?Calciphylaxis on LLE (?other areas), multiple skin tears  Last BM:  12/23  Height:   Ht Readings from Last 1 Encounters:  04/25/24 5' 8 (1.727 m)    Weight:   Wt Readings from Last 1 Encounters:  04/26/24 100.7 kg     BMI:  Body mass index is 33.76 kg/m.  Estimated Nutritional Needs:   Kcal:  1900-2100 kcals  Protein:  110-140 g  Fluid:  1000 mL plus  UOP   Betsey Finger MS, RDN, LDN, CNSC Registered Dietitian 3 Clinical Nutrition RD Inpatient Contact Info in [place]

## 2024-04-26 NOTE — Progress Notes (Addendum)
 eLink Physician-Brief Progress Note Patient Name: Calvin Schultz DOB: 1950-10-03 MRN: [id number]   Date of Service  04/26/2024  HPI/Events of Note  73 year old male with a history of end-stage renal disease on HD, paroxysmal atrial fibrillation, metabolic syndrome who presents to the hospital with out-of-hospital cardiac arrest thought to be secondary to underlying electrolyte disturbances.  Patient is hypothermic with mild hypercapnia.  Severe electrolyte disturbances with hypokalemia to 2.5.  Leukocytosis present.  Imaging reviewed with rib fractures present, patchy airspace opacities.  eICU Interventions  Aggressive electrolyte repletion via OG tube Maintain mechanical ventilation, SBT/SAT Epinephrine  as needed to maintain MAP greater than 65 Normothermia protocol, minimize sedation  DVT prophylaxis with heparin  GI prophylaxis with pantoprazole    0631 - K 2.4, stable La 4.2. Has only received 50 Meq of Kcl. Kcl and Magnesium  infusing. Recheck at noon.  Intervention Category Evaluation Type: New Patient Evaluation  Jinnifer Montejano 04/26/2024, 1:51 AM

## 2024-04-26 NOTE — Progress Notes (Signed)
 LTM EEG hooked up and running - no initial skin breakdown - push button tested - [facility] monitoring.

## 2024-04-26 NOTE — Progress Notes (Signed)
 eLink Physician-Brief Progress Note Patient Name: Calvin Schultz DOB: [birth year] MRN: [id number]   Date of Service  04/26/2024  HPI/Events of Note  Received a call from [facility] about whether they can approach the family about organ donation.  Transition to DNR with interventions requested 12/23 at 7:15 PM, no GOC documented.  eICU Interventions  No contraindications noted in documentation to proceeding with transplantation discussions.    Poor prognosis noted.     Intervention Category Minor Interventions: Routine modifications to care plan (e.g. PRN medications for pain, fever)  Darrious Youman 04/26/2024, 9:14 PM

## 2024-04-26 NOTE — Procedures (Signed)
 Patient Name: Calvin Schultz  MRN: [id number]  Epilepsy Attending: Arlin MALVA Krebs  Referring Physician/Provider: Claudene Fonda BROCKS, NP  Date: 04/26/2024 Duration: 25.25 mins  Patient history: 73yo M s/p cardiac arrest. EEG to evaluate for seizure  Level of alertness: comatose  AEDs during EEG study: Propofol   Technical aspects: This EEG study was done with scalp electrodes positioned according to the 10-20 International system of electrode placement. Electrical activity was reviewed with band pass filter of 1-70Hz , sensitivity of 7 uV/mm, display speed of 75mm/sec with a 60Hz  notched filter applied as appropriate. EEG data were recorded continuously and digitally stored.  Video monitoring was available and reviewed as appropriate.  Description: Patient was noted to have episodes of sudden eye opening with whole body jerking lasting 8-10seconds every few seconds.  Concomitant EEG showed generalized polyspikes consistent with myoclonic seizures. In between seizures EEG showed generalized background suppression. Hyperventilation and photic stimulation were not performed.     ABNORMALITY - Myoclonic seizure, generalized - Background suppression, generalized  IMPRESSION: Patient was noted to have myoclonic seizures every few seconds.  Additionally there was evidence of profound diffuse encephalopathy.  In the setting of cardiac arrest, this EEG pattern is suggestive of anoxic/hypoxic brain injury.  Dr. Harold was notified.   Angie Piercey [place] Swallow

## 2024-04-26 NOTE — Consult Note (Signed)
 WOC Nurse Consult Note:  WOC consult performed remotely utilizing imaging and chart review Reason for Consult: Pressure injuries, skin tears, venous stasis ulcers  Wound type: R/L buttocks deep tissue pressure injury: deep purple maroon discoloration with intact skin, intact periwound Stage 3 coccyx pressure injury; 100% red; intact periwound 3.   Left proximal and distal foot full thickness wounds in setting of DM II; 100 % black, dry; periwound intact 4. Right proximal foot full thickness wound in setting of DM II; 90% yellow 10 % red, periwound intact 5. Right heel proximal and distal unstageable pressure injury; proximal wound- 20% red 80% yellow, distal wound- 100% black eschar, intact periwound 6. LLE proximal and posterior full thickness wound; 80% black eschar 20% yellow slough, intact periwound 7. RLE proximal and medial full thickness wound; 100% yellow slough; intact periwound 8. L elbow grade 2 skin tear: 100% red, periwound ecchymosis 9. R hand grade 2 skin tear; 100% red, periwound ecchymosis 10.R elbow grade 2 skin tear; 100% red, periwound ecchymosis 11. L ischium stage 3 pressure injury: 100 % red, intact periwound Pressure Injury POA: Yes Measurement: see nursing flow sheets Wound bed: see above Drainage (amount, consistency, odor) see nursing flow sheets Periwound: see above Dressing procedure/placement/frequency:  R/L buttocks DTI: Cleanse with soap and water , dry thoroughly, cover with silicone foam dressing, lift each shift to inspect site and change every 3 days and prn soiling. Coccyx/ L ischium: Cleanse wound with NS, pat dry. Place Xeroform over wound bed and cover with silicone foam dressing,  change daily.  L foot proximal and distal wound: paint with betadine and leave open to air. R proximal foot: Cleanse with Vashe (lawson # [id number]) allow to air dry. Apply 1/4 thick layer of Santyl  to wound bed, top with saline moist gauze. Top with silicone foam.  Ok to lift  foam daily for change of gauze and reapplication of Santyl . Ok to change silicone foam every 3 days.   R proximal heel: Cleanse with Vashe (lawson # [id number]) allow to air dry, cover with Xeroform and silicone foam dressing, change daily. R distal heel: paint with betadine and leave open to air. LLE/RLE: Cleanse with Vashe (lawson # [id number]) allow to air dry. Apply 1/4 thick layer of Santyl  to wound bed, top with saline moist gauze. Top with silicone foam.  Ok to lift foam daily for change of gauze and reapplication of Santyl . Ok to change silicone foam every 3 days.   L/R elbow, R hand: Cleanse with NS, allow to air dry. Cover with Xeroform and silicone foam dressing, change daily.                                   WOC team will not follow patient at this time, please re consult if new needs arise or wound deteriorates.   Thank you,  Doyal Polite, MSN, RN, [facility] WOC Team [phone] (Available Mon-Fri 0700-1500)

## 2024-04-26 NOTE — Progress Notes (Signed)
 EEG complete - results pending

## 2024-04-26 NOTE — Consult Note (Signed)
 "  Cardiology Consultation   Patient ID: Calvin Schultz MRN: [id number]; DOB: 1950-12-22  Admit date: 04/25/2024 Date of Consult: 04/26/2024  PCP:  Calvin Elberta Gainer, FNP   [facility] Providers Cardiologist:  Madonna Large, DO        Patient Profile: Calvin Schultz is a 73 y.o. male with a hx of end-stage renal disease on Tuesday Thursday Saturday dialysis, hypertension, paroxysmal atrial fibrillation who is being seen 04/26/2024 for the evaluation of an abnormal ECG after PEA arrest at the request of the emergency department.  History of Present Illness: Calvin Schultz 73 year old male history notable for end-stage renal disease, paroxysmal atrial fibrillation, diabetes, hypertension who presented to the ED after a PEA arrest at home.  Per verbal report of several providers-he was in his usual state of health without complaints of chest pain or dyspnea where he had sudden onset loss of consciousness and loss of pulse which is noted by his wife.  Wife started CPR-called EMS.  On EMS arrival-she was noted to be in PEA-but ultimately received ROSC in the field-was started on epinephrine  post ROSC for hypotension.  Most notably his workup since arrival was notable for a potassium of 2.7-which is the leading theory for etiology of PEA arrest.  Other labs included a pH of 7.28, pCO2 431, potassium of 2.1 on ABG, hemoglobin 10 point, lactate of 5.3.  We have been consulted given an abnormal ECG.  Personally reviewed, no sign of STEMI post arrest. Rate controlled atrial fibrillation with a normal QT interval-right bundle branch block, PVCs-borderline inferior Q waves-overall completely unchanged when comparing prior ECG dated 10/13/2023.  Of note, on chart review he has had several echocardiograms throughout the year-most recent TTE on 01/17/2024 with normal LVEF-no evidence of moderately enlarged, moderately reduced RV function with limited assessment of PASP.  Bedside POCUS completed overall  similar to prior echo-moderately dilated right ventricle-the left ventricular systolic ejection fraction remains preserved without wall motion abnormality.  Current hemodynamics are well supported on epinephrine  currently at 10 mcg/min.   Past Medical History:  Diagnosis Date   Arthritis    Atrial fibrillation (HCC)    CKD (chronic kidney disease) stage 3, GFR 30-59 ml/min (HCC)    Diabetes mellitus without complication (HCC)    Dysrhythmia    Hyperlipidemia    Hypertension     Past Surgical History:  Procedure Laterality Date   AV FISTULA PLACEMENT Left 10/09/2023   Procedure: ARTERIOVENOUS (AV) FISTULA CREATION, FIRST STAGE BASILIC;  Surgeon: Pearline Norman RAMAN, MD;  Location: [facility] OR;  Service: Vascular;  Laterality: Left;  LEFT ARM AVF VERSUS AVG   CYSTOSCOPY W/ URETERAL STENT PLACEMENT Right 08/05/2023   Procedure: CYSTOSCOPY, WITH RETROGRADE PYELOGRAM AND URETERAL STENT INSERTION;  Surgeon: Roseann Adine PARAS., MD;  Location: [facility] OR;  Service: Urology;  Laterality: Right;   CYSTOSCOPY/URETEROSCOPY/HOLMIUM LASER/STENT PLACEMENT Left 07/24/2023   Procedure: CYSTOSCOPY/URETEROSCOPY/HOLMIUM LASER/ STENT PLACEMENT/REMOVAL OF NEPHROSTOMY TUBE;  Surgeon: Nieves Cough, MD;  Location: [facility] ORS;  Service: Urology;  Laterality: Left;  90 MINUTE CASE   CYSTOSCOPY/URETEROSCOPY/HOLMIUM LASER/STENT PLACEMENT Bilateral 08/18/2023   Procedure: CYSTOSCOPY/URETEROSCOPY/HOLMIUM LASER/STENT PLACEMENT;  Surgeon: Nieves Cough, MD;  Location: [facility] OR;  Service: Urology;  Laterality: Bilateral;   DIALYSIS/PERMA CATHETER INSERTION N/A 01/20/2024   Procedure: DIALYSIS/PERMA CATHETER INSERTION;  Surgeon: Lanis Fonda BRAVO, MD;  Location: [facility] INVASIVE CV LAB;  Service: Cardiovascular;  Laterality: N/A;   INSERTION OF DIALYSIS CATHETER Right 10/09/2023   Procedure: INSERTION OF DIALYSIS CATHETER, USING PALINDROME CATHETER KIT 19CM;  Surgeon: Pearline Norman RAMAN, MD;  Location: [facility] OR;  Service: Vascular;  Laterality:  Right;  TUNNELED DIALYSIS CATHETER   IR NEPHROSTOMY PLACEMENT LEFT  06/03/2023   KIDNEY SURGERY     TRANSESOPHAGEAL ECHOCARDIOGRAM (CATH LAB) N/A 01/19/2024   Procedure: TRANSESOPHAGEAL ECHOCARDIOGRAM;  Surgeon: Shlomo Wilbert SAUNDERS, MD;  Location: [facility] INVASIVE CV LAB;  Service: Cardiovascular;  Laterality: N/A;   TUNNELLED CATHETER EXCHANGE N/A 11/20/2023   Procedure: TUNNELLED CATHETER EXCHANGE;  Surgeon: Sheree Penne Bruckner, MD;  Location: [facility];  Service: Cardiovascular;  Laterality: N/A;       Scheduled Meds:  Chlorhexidine  Gluconate Cloth  6 each Topical Daily   heparin   5,000 Units Subcutaneous Q8H   insulin  aspart  0-9 Units Subcutaneous Q4H   pantoprazole  (PROTONIX ) IV  40 mg Intravenous QHS   Continuous Infusions:  amiodarone  60 mg/hr (04/26/24 0005)   Followed by   amiodarone      epinephrine  10 mcg/min (04/25/24 2221)   potassium chloride  10 mEq (04/26/24 0025)   propofol  (DIPRIVAN ) infusion 20 mcg/kg/min (04/25/24 2235)   PRN Meds: docusate sodium , polyethylene glycol  Allergies:   Allergies[1]  Social History:   Social History   Socioeconomic History   Marital status: Married    Spouse name: Not on file   Number of children: 1   Years of education: Not on file   Highest education level: Not on file  Occupational History   Not on file  Tobacco Use   Smoking status: Never   Smokeless tobacco: Never  Vaping Use   Vaping status: Never Used  Substance and Sexual Activity   Alcohol  use: Never   Drug use: Never   Sexual activity: Not Currently  Other Topics Concern   Not on file  Social History Narrative   Not on file   Social Drivers of Health   Tobacco Use: Low Risk (04/25/2024)   Patient History    Smoking Tobacco Use: Never    Smokeless Tobacco Use: Never    Passive Exposure: Not on file  Financial Resource Strain: Not on file  Food Insecurity: No Food Insecurity (01/15/2024)   Epic    Worried About Programme Researcher, Broadcasting/film/video in the Last Year:  Never true    Ran Out of Food in the Last Year: Never true  Transportation Needs: No Transportation Needs (01/15/2024)   Epic    Lack of Transportation (Medical): No    Lack of Transportation (Non-Medical): No  Physical Activity: Not on file  Stress: Not on file  Social Connections: Unknown (01/15/2024)   Social Connection and Isolation Panel    Frequency of Communication with Friends and Family: Twice a week    Frequency of Social Gatherings with Friends and Family: Patient unable to answer    Attends Religious Services: 1 to 4 times per year    Active Member of [employer] or Organizations: No    Attends Banker Meetings: Never    Marital Status: Married  Catering Manager Violence: Not At Risk (01/15/2024)   Epic    Fear of Current or Ex-Partner: No    Emotionally Abused: No    Physically Abused: No    Sexually Abused: No  Depression (PHQ2-9): Not on file  Alcohol  Screen: Not on file  Housing: Low Risk (01/15/2024)   Epic    Unable to Pay for Housing in the Last Year: No    Number of Times Moved in the Last Year: 0    Homeless in the Last Year:  No  Utilities: Not At Risk (01/15/2024)   Epic    Threatened with loss of utilities: No  Health Literacy: Not on file    Family History:    Family History  Problem Relation Age of Onset   Dementia Mother      ROS:  Please see the history of present illness.   All other ROS reviewed and negative.     Physical Exam/Data: Vitals:   04/26/24 0000 04/26/24 0015 04/26/24 0030 04/26/24 0045  BP:   (!) 148/85   Pulse: (!) 114   (!) 101  Resp: 13 (!) 28 (!) 22 19  Temp: (!) 93.5 F (34.2 C) (!) 93.3 F (34.1 C) (!) 93.2 F (34 C) (!) 93.1 F (33.9 C)  SpO2: 100%   100%  Height:       No intake or output data in the 24 hours ending 04/26/24 0111    03/01/2024    2:17 PM 01/23/2024    5:00 AM 01/22/2024   12:27 PM  Last 3 Weights  Weight (lbs) -- 238 lb 5.1 oz 222 lb 3.6 oz  Weight (kg) -- 108.1 kg 100.8 kg     Body  mass index is 36.24 kg/m.  General: Chronically ill-appearing male, intubated and sedated Cardiac: Irregularly irregular rhythm, PVCs noted, otherwise normal S1 and S2 without murmurs rubs or gallops, no appreciable jugular venous distention Lungs: Mechanical breath sounds appreciated bilaterally without crackles Abd: Soft and nondistended Ext: 2-3 plus bilateral lower extremity edema Neuro: Sedated intubated Psych:  Normal affect   EKG:  The EKG was personally reviewed and demonstrates: Atrial fibrillation, RBBB, poor Rwave progression, borderline inferior q-waves   Relevant CV Studies: TTE 01/17/2024  1. Left ventricular ejection fraction, by estimation, is 55%. The left  ventricle has normal function. The left ventricle has no regional wall  motion abnormalities. There is moderate left ventricular hypertrophy. Left  ventricular diastolic parameters are   indeterminate. There is the interventricular septum is flattened in  diastole ('D' shaped left ventricle), consistent with right ventricular  volume overload.   2. Right ventricular systolic function is moderately reduced. The right  ventricular size is moderately enlarged. Tricuspid regurgitation signal is  inadequate for assessing PA pressure.   3. The mitral valve is degenerative. Mild mitral valve regurgitation. No  evidence of mitral stenosis. Moderate mitral annular calcification.   4. The aortic valve is tricuspid. There is mild calcification of the  aortic valve. Aortic valve regurgitation is not visualized. No aortic  stenosis is present.   5. Aortic dilatation noted. There is borderline dilatation of the  ascending aorta, measuring 42 mm.   6. The inferior vena cava is dilated in size with <50% respiratory  variability, suggesting right atrial pressure of 15 mmHg.    Laboratory Data: High Sensitivity Troponin:  No results for input(s): TROPONINIHS in the last 720 hours.   Chemistry Recent Labs  Lab 04/25/24 2217  04/25/24 2218 04/25/24 2222 04/26/24 0015  NA 140 138  --  137  K 2.7* 2.5*  --  2.2*  CL 97* 97*  --   --   CO2 22  --   --   --   GLUCOSE 185* 182*  --   --   BUN 18 20  --   --   CREATININE 2.83* 2.90*  --   --   CALCIUM  9.2  --   --   --   MG  --   --  1.9  --  GFRNONAA 23*  --   --   --   ANIONGAP 22*  --   --   --     Recent Labs  Lab 04/25/24 2217  PROT 5.5*  ALBUMIN  2.6*  AST 20  ALT 9  ALKPHOS 145*  BILITOT 0.3   Lipids No results for input(s): CHOL, TRIG, HDL, LABVLDL, LDLCALC, CHOLHDL in the last 168 hours.  Hematology Recent Labs  Lab 04/25/24 2217 04/25/24 2218 04/26/24 0015  WBC 34.3*  --   --   RBC 3.13*  --   --   HGB 8.6* 10.5* 10.9*  HCT 30.5* 31.0* 32.0*  MCV 97.4  --   --   MCH 27.5  --   --   MCHC 28.2*  --   --   RDW 16.2*  --   --   PLT 272  --   --    Thyroid No results for input(s): TSH, FREET4 in the last 168 hours.  BNPNo results for input(s): BNP, PROBNP in the last 168 hours.  DDimer No results for input(s): DDIMER in the last 168 hours.  Radiology/Studies:  CT Angio Chest PE W and/or Wo Contrast Result Date: 04/26/2024 EXAM: CTA CHEST PE WITHOUT AND WITH CONTRAST CT ABDOMEN AND PELVIS WITHOUT AND WITH CONTRAST 04/26/2024 12:54:18 AM TECHNIQUE: CTA of the chest was performed after the administration of intravenous contrast. Multiplanar reformatted images are provided for review. MIP images are provided for review. CT of the abdomen and pelvis was performed with the administration of intravenous contrast. Automated exposure control, iterative reconstruction, and/or weight based adjustment of the mA/kV was utilized to reduce the radiation dose to as low as reasonably achievable. COMPARISON: Chest x-ray dated 04/24/2024. CLINICAL HISTORY: cardiac arrest FINDINGS: CHEST: PULMONARY ARTERIES: Pulmonary arteries are adequately opacified for evaluation. No intraluminal filling defect to suggest pulmonary embolism. Main  pulmonary artery is normal in caliber. MEDIASTINUM: Enteric tube tip in the stomach. Coronary artery and aortic atherosclerotic calcification. Cardiomegaly. Reflux of contrast into the IVC and hepatic veins compatible with elevated right heart pressures. Dilated ascending aorta measuring 42 mm. No mediastinal lymphadenopathy. LUNGS AND PLEURA: Bilateral patchy airspace opacities and consolidation greatest in the lower lobes. Findings are compatible with aspiration / bronchopneumonia. Moderate right and small left pleural effusions. No pneumothorax. SOFT TISSUES AND BONES: IO catheter in the left humerus. Nondisplaced fractures of the bilateral anterior 6th ribs. Diffuse body wall edema. ABDOMEN AND PELVIS: LIVER: No acute abnormality. GALLBLADDER AND BILE DUCTS: Cholelithiasis. No biliary ductal dilatation. SPLEEN: Spleen demonstrates no acute abnormality. PANCREAS: Pancreas demonstrates no acute abnormality. ADRENAL GLANDS: Adrenal glands demonstrate no acute abnormality. KIDNEYS, URETERS AND BLADDER: Left nephrolithiasis measuring up to 15 mm. No stones in the right kidney or ureters. No hydronephrosis. No perinephric or periureteral stranding. Foley catheter and gas in the decompressed bladder. GI AND BOWEL: Enteric tube tip in the stomach. Small stool ball in the rectum. Normal appendix. Stomach and duodenal sweep demonstrate no acute abnormality. There is no bowel obstruction. No abnormal bowel wall thickening or distension. REPRODUCTIVE: Reproductive organs are unremarkable. PERITONEUM AND RETROPERITONEUM: Right femoral CVC tip in the infrarenal IVC at the level of the liver. Small amount of fluid in the pelvis. No ascites or free air. LYMPH NODES: No lymphadenopathy. BONES AND SOFT TISSUES: No acute abnormality of the visualized bones. No focal soft tissue abnormality. IMPRESSION: 1. Negative for pulmonary embolism. 2. Bilateral patchy airspace opacities and consolidation, greatest in the lower lobes,  compatible with aspiration/bronchopneumonia, with moderate  right and small left pleural effusions. 3. Nondisplaced fractures of the bilateral anterior 6th ribs. 4. Dilated ascending aorta measuring 42 mm.Recommend annual imaging followup by CTA or MRA. This recommendation follows 2010 ACCF/AHA/AATS/ACR/ASA/SCA/SCAI/SIR/STS/SVM Guidelines for the Diagnosis and Management of Patients with Thoracic Aortic Disease. Circulation. 2010; 121: Z733-z630. Aortic aneurysm NOS (ICD10-I71.9) Electronically signed by: Norman Gatlin MD 04/26/2024 01:06 AM EST RP Workstation: [id number]   CT ABDOMEN PELVIS W CONTRAST Result Date: 04/26/2024 EXAM: CTA CHEST PE WITHOUT AND WITH CONTRAST CT ABDOMEN AND PELVIS WITHOUT AND WITH CONTRAST 04/26/2024 12:54:18 AM TECHNIQUE: CTA of the chest was performed after the administration of intravenous contrast. Multiplanar reformatted images are provided for review. MIP images are provided for review. CT of the abdomen and pelvis was performed with the administration of intravenous contrast. Automated exposure control, iterative reconstruction, and/or weight based adjustment of the mA/kV was utilized to reduce the radiation dose to as low as reasonably achievable. COMPARISON: Chest x-ray dated 04/24/2024. CLINICAL HISTORY: cardiac arrest FINDINGS: CHEST: PULMONARY ARTERIES: Pulmonary arteries are adequately opacified for evaluation. No intraluminal filling defect to suggest pulmonary embolism. Main pulmonary artery is normal in caliber. MEDIASTINUM: Enteric tube tip in the stomach. Coronary artery and aortic atherosclerotic calcification. Cardiomegaly. Reflux of contrast into the IVC and hepatic veins compatible with elevated right heart pressures. Dilated ascending aorta measuring 42 mm. No mediastinal lymphadenopathy. LUNGS AND PLEURA: Bilateral patchy airspace opacities and consolidation greatest in the lower lobes. Findings are compatible with aspiration / bronchopneumonia. Moderate right  and small left pleural effusions. No pneumothorax. SOFT TISSUES AND BONES: IO catheter in the left humerus. Nondisplaced fractures of the bilateral anterior 6th ribs. Diffuse body wall edema. ABDOMEN AND PELVIS: LIVER: No acute abnormality. GALLBLADDER AND BILE DUCTS: Cholelithiasis. No biliary ductal dilatation. SPLEEN: Spleen demonstrates no acute abnormality. PANCREAS: Pancreas demonstrates no acute abnormality. ADRENAL GLANDS: Adrenal glands demonstrate no acute abnormality. KIDNEYS, URETERS AND BLADDER: Left nephrolithiasis measuring up to 15 mm. No stones in the right kidney or ureters. No hydronephrosis. No perinephric or periureteral stranding. Foley catheter and gas in the decompressed bladder. GI AND BOWEL: Enteric tube tip in the stomach. Small stool ball in the rectum. Normal appendix. Stomach and duodenal sweep demonstrate no acute abnormality. There is no bowel obstruction. No abnormal bowel wall thickening or distension. REPRODUCTIVE: Reproductive organs are unremarkable. PERITONEUM AND RETROPERITONEUM: Right femoral CVC tip in the infrarenal IVC at the level of the liver. Small amount of fluid in the pelvis. No ascites or free air. LYMPH NODES: No lymphadenopathy. BONES AND SOFT TISSUES: No acute abnormality of the visualized bones. No focal soft tissue abnormality. IMPRESSION: 1. Negative for pulmonary embolism. 2. Bilateral patchy airspace opacities and consolidation, greatest in the lower lobes, compatible with aspiration/bronchopneumonia, with moderate right and small left pleural effusions. 3. Nondisplaced fractures of the bilateral anterior 6th ribs. 4. Dilated ascending aorta measuring 42 mm.Recommend annual imaging followup by CTA or MRA. This recommendation follows 2010 ACCF/AHA/AATS/ACR/ASA/SCA/SCAI/SIR/STS/SVM Guidelines for the Diagnosis and Management of Patients with Thoracic Aortic Disease. Circulation. 2010; 121: [id number]. Aortic aneurysm NOS (ICD10-I71.9) Electronically signed by:  Norman Gatlin MD 04/26/2024 01:06 AM EST RP Workstation: [id number]   CT Head Wo Contrast Result Date: 04/26/2024 CLINICAL DATA:  Provided history: Mental status change, unknown cause s/p arrest, altered ms EXAM: CT HEAD WITHOUT CONTRAST TECHNIQUE: Contiguous axial images were obtained from the base of the skull through the vertex without intravenous contrast. RADIATION DOSE REDUCTION: This exam was performed according to the departmental dose-optimization program which includes  automated exposure control, adjustment of the mA and/or kV according to patient size and/or use of iterative reconstruction technique. COMPARISON:  Head CT 01/14/2024 FINDINGS: Brain: No intracranial hemorrhage, mass effect, or midline shift. Stable degree of atrophy and chronic small vessel ischemia. No hydrocephalus. The basilar cisterns are patent. No evidence of cerebral edema. No evidence of territorial infarct or acute ischemia. No extra-axial or intracranial fluid collection. Vascular: Atherosclerosis of skullbase vasculature without hyperdense vessel or abnormal calcification. Skull: No fracture or focal lesion. Sinuses/Orbits: Mucous retention cyst in the right maxillary sinus. No acute findings. Other: None. IMPRESSION: 1. No acute intracranial abnormality. 2. Stable atrophy and chronic small vessel ischemia. Electronically Signed   By: Andrea Gasman M.D.   On: 04/26/2024 01:04   DG Abd Portable 1 View Result Date: 04/25/2024 CLINICAL DATA:  Nasogastric tube placement. EXAM: PORTABLE ABDOMEN - 1 VIEW COMPARISON:  07/01/2023 FINDINGS: Tip and side port of the enteric tube below the diaphragm in the stomach. There is gaseous gastric distension. Presumed dialysis catheter from an inferior approach overlies the expected upper IVC. Previous left-sided drainage catheter is no longer seen. Suspected left renal calculi. IMPRESSION: 1. Tip and side port of the enteric tube below the diaphragm in the stomach. 2. Gaseous gastric  distension. Electronically Signed   By: Andrea Gasman M.D.   On: 04/25/2024 22:59   DG Chest Port 1 View Result Date: 04/25/2024 CLINICAL DATA:  Unresponsive.  Intubated. EXAM: PORTABLE CHEST 1 VIEW COMPARISON:  01/23/2024 FINDINGS: Endotracheal tube tip is 2.7 cm from the carina. Enteric tube tip below the diaphragm not included in the field of view. Cardiomegaly is stable. Unchanged mediastinal contours. Aortic atherosclerosis. Bilateral pleural effusions and bibasilar opacities, worsened from prior exam. Vascular congestion. No pneumothorax. IMPRESSION: 1. Endotracheal tube tip 2.7 cm from the carina. 2. Bilateral pleural effusions and bibasilar opacities, worsened from prior exam. 3. Cardiomegaly and vascular congestion. Electronically Signed   By: Andrea Gasman M.D.   On: 04/25/2024 22:58     Assessment and Plan: Cardiac arrest (PEA) Workup thus far is most notable for significant hypokalemia on arrival which is currently the leading causative diagnosis.  Postarrest ECG does not demonstrate an acute MI-and therefore there is no indication for urgent catheterization.  As above, POCUS with preserved left ventricular systolic function without wall motion abnormality-though historically has had some right ventricular dysfunction which is again evident today.  Reasonable to rule out pulmonary embolism as a precipitant for elevated right-sided pressures and RV dysfunction.  At this time, low suspicion for RV failure as his extremities are relatively warm-and RV systolic function does not visually appear to be significantly impaired nor is there is significant RV dilation-mild to moderate on my exam. -Would replete potassium aggressively, though appropriate caution given ESRD -Agree with CTA PE study -Remainder of critical care per intensivist  2.  NSTEMI, type II MI Mildly elevated troponin on arrival suspected to be secondary to demand ischemia from cardiac arrest.  Would continue to trend to  peak-the low suspicion for an acute plaque rupture.    Risk Assessment/Risk Scores:    TIMI Risk Score for Unstable Angina or Non-ST Elevation MI:   The patient's TIMI risk score is 3, which indicates a 13% risk of all cause mortality, new or recurrent myocardial infarction or need for urgent revascularization in the next 14 days.    CHA2DS2-VASc Score = 3   This indicates a 3.2% annual risk of stroke. The patient's score is based upon: CHF  History: 0 HTN History: 1 Diabetes History: 1 Stroke History: 0 Vascular Disease History: 0 Age Score: 1 Gender Score: 0        For questions or updates, please contact [facility] Please consult [email or web address] for contact info under    Signed, Dorn Kapur, MD  04/26/2024 1:11 AM     [1] No Known Allergies  "

## 2024-04-26 NOTE — Progress Notes (Addendum)
 "    Advanced Heart Failure Rounding Note  Cardiologist: Madonna Large, DO  Chief Complaint: PEA arrest Patient Profile   Calvin Schultz is a 73 y.o. male with ESRD on iHD T,Thu, Sat, HTN, PA and chronic BLE wounds. Admitted post PEA arrest.   Significant events:  04/25/24: Admitted with witnessed cardiac arrest. Wife started CPR. PEA on EMS arrival. K 2.5 on admission.  04/26/24: EEG consistent with myoclonic seizures every few seconds, profound diffuse encephalopathy. Findings consistent with anoxic brain injury. Nephrology consulted.   Subjective:    Intubated and sedated. Wife and family at the bedside. Upward gaze, intt jerking.   Objective:   Weight Range: 100.7 kg Body mass index is 33.76 kg/m.   Vital Signs:   Temp:  [92.3 F (33.5 C)-98.1 F (36.7 C)] 98.1 F (36.7 C) (12/23 1300) Pulse Rate:  [54-135] 70 (12/23 1300) Resp:  [0-28] 25 (12/23 1300) BP: (80-156)/(54-98) 98/62 (12/23 1300) SpO2:  [93 %-100 %] 100 % (12/23 1300) Arterial Line BP: (91-184)/(47-77) 114/50 (12/23 1300) FiO2 (%):  [40 %-100 %] 40 % (12/23 1042) Weight:  [100.7 kg] 100.7 kg (12/23 0324) Last BM Date : 04/26/24  Weight change: Filed Weights   04/26/24 0324  Weight: 100.7 kg    Intake/Output:   Intake/Output Summary (Last 24 hours) at 04/26/2024 1350 Last data filed at 04/26/2024 1000 Gross per 24 hour  Intake 1539.75 ml  Output --  Net 1539.75 ml     Physical Exam   General:  critically/chronically ill appearing.  Generalized ecchymosis and skin tears.  Cor: Regular rate & irregular rhythm.   Lungs: clear, diminished bases Extremities: BLE wrapped 2/2 chronic leg wounds   Telemetry   A fib 70s, intt PVCs (Personally reviewed)    Labs   CBC Recent Labs    04/25/24 2217 04/25/24 2218 04/26/24 0015 04/26/24 0438  WBC 34.3*  --   --  46.9*  HGB 8.6*   < > 10.9* 8.8*  HCT 30.5*   < > 32.0* 29.1*  MCV 97.4  --   --  92.1  PLT 272  --   --  201   < > = values in this  interval not displayed.   Basic Metabolic Panel Recent Labs    87/76/74 0438 04/26/24 1201  NA 138 137  K 2.4* 3.7  CL 95* 95*  CO2 21* 20*  GLUCOSE 195* 149*  BUN 20 22  CREATININE 3.08* 3.12*  CALCIUM  8.9 9.0  MG 1.7 2.0  PHOS 3.4  --    Liver Function Tests Recent Labs    04/25/24 2217 04/26/24 0438  AST 20 21  ALT 9 14  ALKPHOS 145* 135*  BILITOT 0.3 0.5  PROT 5.5* 5.2*  ALBUMIN  2.6* 2.6*   No results for input(s): LIPASE, AMYLASE in the last 72 hours. Cardiac Enzymes No results for input(s): CKTOTAL, CKMB, CKMBINDEX, TROPONINI in the last 72 hours.  BNP: BNP (last 3 results) Recent Labs    10/01/23 1543  BNP 640.6*    ProBNP (last 3 results) No results for input(s): PROBNP in the last 8760 hours.   D-Dimer No results for input(s): DDIMER in the last 72 hours. Hemoglobin A1C No results for input(s): HGBA1C in the last 72 hours. Fasting Lipid Panel Recent Labs    04/26/24 0438  TRIG 67   Medications:   Scheduled Medications:  Chlorhexidine  Gluconate Cloth  6 each Topical Daily   collagenase    Topical Daily   heparin   5,000 Units Subcutaneous Q8H   insulin  aspart  0-9 Units Subcutaneous Q4H   levETIRAcetam   500 mg Intravenous Q12H   mouth rinse  15 mL Mouth Rinse Q2H   pantoprazole  (PROTONIX ) IV  40 mg Intravenous QHS   vancomycin  variable dose per unstable renal function (pharmacist dosing)   Does not apply See admin instructions    Infusions:  amiodarone  30 mg/hr (04/26/24 1302)   epinephrine  5 mcg/min (04/26/24 1000)   piperacillin -tazobactam (ZOSYN )  IV 2.25 g (04/26/24 1304)   potassium chloride  10 mEq (04/26/24 1255)   potassium chloride      propofol  (DIPRIVAN ) infusion 10 mcg/kg/min (04/26/24 1000)    PRN Medications: docusate sodium , mouth rinse, perflutren  lipid microspheres (DEFINITY ) IV suspension, polyethylene glycol, potassium chloride   Assessment/Plan   PEA arrest > CGS - TEE 9/25 EF 50-55%, LV with  no RWMA, RV mildly reduced, mild MR - Echo today, read pending - Volume stable one exam, difficult to gauge. Mgmt per nephrology.  - Suspect arrest 2/2 electrolyte imbalances. K 2.5. Now WNL.  - Not a candidate for advanced therapies with multiple comorbidities (ESRD, anoxic brain injury. chronic leg wounds) - Poor prognosis, would recommend ongoing GOC discussions.   ESRD - On iHD T, Thu, Sat.  - Nephrology following, plan for possible iHD vs CRRT tomorrow  Anoxic brain injury - EEG today with myoclonic seizures every few seconds, profound diffuse encephalopathy. Findings consistent with anoxic brain injury.  - Neurology following  Hypokalemia  - K 2.5 on admission - Resolved - Keep K >4 and Mg >2  5. Chronic leg wounds - Wrapped. WOC following  Poor prognosis.   CRITICAL CARE Performed by: Beckey LITTIE Coe   Total critical care time: 14 minutes  Critical care time was exclusive of separately billable procedures and treating other patients.  Critical care was necessary to treat or prevent imminent or life-threatening deterioration.  Critical care was time spent personally by me on the following activities: development of treatment plan with patient and/or surrogate as well as nursing, discussions with consultants, evaluation of patient's response to treatment, examination of patient, obtaining history from patient or surrogate, ordering and performing treatments and interventions, ordering and review of laboratory studies, ordering and review of radiographic studies, pulse oximetry and re-evaluation of patient's condition.   Length of Stay: 1  Beckey LITTIE Coe, NP  04/26/2024, 1:50 PM  Advanced Heart Failure Team Pager [phone] (M-F; 7a - 5p)   Please visit [email or web address]: For overnight coverage please call cardiology fellow first. If fellow not available call Shock/ECMO MD on call.  For ECMO / Mechanical Support (Impella, IABP, LVAD) issues call Shock / ECMO MD on call.    Agree  with above   Remains unresponsive on vent.   Echo EF 50-55% with mod RV dysfunction.   Exam and EEG c/w severe anoxic brain injury   General:  unresponsive on vent. + myoclonus HEENT: normal +ETT Neck: supple.  Rnm:pmmzhlojm rate & rhythm.  Lungs: clear Abdomen: soft, nontender, nondistended.Good bowel sounds. Extremities: warm LEs wrapped dt wounds Neuro: unresponsive on vent  He unfortunately appears to have significant anoxic injury. Echo findings as above. No role for mechanical support. D/w CCM team at bedside.   AHF team will sign off.   CRITICAL CARE Performed by: Cherrie Sieving  Total critical care time: 34 minutes  Critical care time was exclusive of separately billable procedures and treating other patients.  Critical care was necessary to treat or prevent imminent or life-threatening deterioration.  Critical care was time spent personally by me (independent of midlevel providers or residents) on the following activities: development of treatment plan with patient and/or surrogate as well as nursing, discussions with consultants, evaluation of patient's response to treatment, examination of patient, obtaining history from patient or surrogate, ordering and performing treatments and interventions, ordering and review of laboratory studies, ordering and review of radiographic studies, pulse oximetry and re-evaluation of patient's condition.  Toribio Fuel, MD  11:25 PM    "

## 2024-04-26 NOTE — Consult Note (Addendum)
 [facility] Nephrology Consult Note: Reason for Consult: To manage dialysis and dialysis related needs Referring Physician: Dr. Harold, Valinda  HPI:  Calvin Schultz is an 73 y.o. male with past medical history significant for hypertension, dyslipidemia, diabetes, A-fib, ESRD on HD who was admitted with PEA cardiac arrest, seen as a consult for the management of ESRD. Reportedly the patient had sudden loss of consciousness and lost his pulse.  The wife started CPR and called EMS.  EMS found him to be in PEA but ultimately obtained ROSC in the field, started epinephrine  for hypotension. In the ER, he was found to have a potassium level of 2.7.  EKG with normal QT interval and essentially unchanged EKG from previous.  Patient was intubated, currently on epinephrine  and admitted to ICU.  Received multiple rounds of potassium chloride . The last dialysis was on 12/21 and outpatient potassium level was 5. The patient is currently intubated, sedated.  Currently on amiodarone , epinephrine , vancomycin  and ceftriaxone .  His wife was presented at the bedside. OP HD orders:  Dialyzes at [facility], MWF, EDW: 97.4 kg, 4 hr, 2k 2.5 ca, last OP HD on 12/21, R groin TDC, Mircera 225 mcg on 12/21 Sod thiosulfate at HD.  Past Medical History:  Diagnosis Date   Arthritis    Atrial fibrillation (HCC)    CKD (chronic kidney disease) stage 3, GFR 30-59 ml/min (HCC)    Diabetes mellitus without complication (HCC)    Dysrhythmia    Hyperlipidemia    Hypertension     Past Surgical History:  Procedure Laterality Date   AV FISTULA PLACEMENT Left 10/09/2023   Procedure: ARTERIOVENOUS (AV) FISTULA CREATION, FIRST STAGE BASILIC;  Surgeon: Pearline Norman RAMAN, MD;  Location: [facility] OR;  Service: Vascular;  Laterality: Left;  LEFT ARM AVF VERSUS AVG   CYSTOSCOPY W/ URETERAL STENT PLACEMENT Right 08/05/2023   Procedure: CYSTOSCOPY, WITH RETROGRADE PYELOGRAM AND URETERAL STENT INSERTION;  Surgeon: Roseann Adine PARAS., MD;   Location: [facility] OR;  Service: Urology;  Laterality: Right;   CYSTOSCOPY/URETEROSCOPY/HOLMIUM LASER/STENT PLACEMENT Left 07/24/2023   Procedure: CYSTOSCOPY/URETEROSCOPY/HOLMIUM LASER/ STENT PLACEMENT/REMOVAL OF NEPHROSTOMY TUBE;  Surgeon: Nieves Cough, MD;  Location: [facility] ORS;  Service: Urology;  Laterality: Left;  90 MINUTE CASE   CYSTOSCOPY/URETEROSCOPY/HOLMIUM LASER/STENT PLACEMENT Bilateral 08/18/2023   Procedure: CYSTOSCOPY/URETEROSCOPY/HOLMIUM LASER/STENT PLACEMENT;  Surgeon: Nieves Cough, MD;  Location: [facility] OR;  Service: Urology;  Laterality: Bilateral;   DIALYSIS/PERMA CATHETER INSERTION N/A 01/20/2024   Procedure: DIALYSIS/PERMA CATHETER INSERTION;  Surgeon: Lanis Fonda BRAVO, MD;  Location: [facility] INVASIVE CV LAB;  Service: Cardiovascular;  Laterality: N/A;   INSERTION OF DIALYSIS CATHETER Right 10/09/2023   Procedure: INSERTION OF DIALYSIS CATHETER, USING PALINDROME CATHETER KIT 19CM;  Surgeon: Pearline Norman RAMAN, MD;  Location: [facility] OR;  Service: Vascular;  Laterality: Right;  TUNNELED DIALYSIS CATHETER   IR NEPHROSTOMY PLACEMENT LEFT  06/03/2023   KIDNEY SURGERY     TRANSESOPHAGEAL ECHOCARDIOGRAM (CATH LAB) N/A 01/19/2024   Procedure: TRANSESOPHAGEAL ECHOCARDIOGRAM;  Surgeon: Shlomo Wilbert SAUNDERS, MD;  Location: [facility] INVASIVE CV LAB;  Service: Cardiovascular;  Laterality: N/A;   TUNNELLED CATHETER EXCHANGE N/A 11/20/2023   Procedure: TUNNELLED CATHETER EXCHANGE;  Surgeon: Sheree Penne Bruckner, MD;  Location: [facility] LAB;  Service: Cardiovascular;  Laterality: N/A;    Family History  Problem Relation Age of Onset   Dementia Mother     Social History:  reports that he has never smoked. He has never used smokeless tobacco. He reports that he does not drink alcohol  and does not  use drugs.  Allergies: Allergies[1]  Medications: I have reviewed the patient's current medications.   Results for orders placed or performed during the hospital encounter of 04/25/24 (from the past 48 hours)  CBG  monitoring, ED     Status: Abnormal   Collection Time: 04/25/24 10:13 PM  Result Value Ref Range   Glucose-Capillary 174 (H) 70 - 99 mg/dL    Comment: Glucose reference range applies only to samples taken after fasting for at least 8 hours.  Urinalysis, Routine w reflex microscopic -Urine, Catheterized     Status: Abnormal   Collection Time: 04/25/24 10:14 PM  Result Value Ref Range   Color, Urine AMBER (A) YELLOW    Comment: BIOCHEMICALS MAY BE AFFECTED BY COLOR   APPearance TURBID (A) CLEAR   Specific Gravity, Urine 1.018 1.005 - 1.030   pH 7.0 5.0 - 8.0   Glucose, UA NEGATIVE NEGATIVE mg/dL   Hgb urine dipstick SMALL (A) NEGATIVE   Bilirubin Urine NEGATIVE NEGATIVE   Ketones, ur NEGATIVE NEGATIVE mg/dL   Protein, ur >=699 (A) NEGATIVE mg/dL   Nitrite NEGATIVE NEGATIVE   Leukocytes,Ua MODERATE (A) NEGATIVE   RBC / HPF 0-5 0 - 5 RBC/hpf   WBC, UA >50 0 - 5 WBC/hpf   Bacteria, UA MANY (A) NONE SEEN   Squamous Epithelial / HPF 0-5 0 - 5 /HPF   Mucus PRESENT    Non Squamous Epithelial 0-5 (A) NONE SEEN    Comment: Performed at [facility] Lab, [address] [place], [place] [address]  CBC     Status: Abnormal   Collection Time: 04/25/24 10:17 PM  Result Value Ref Range   WBC 34.3 (H) 4.0 - 10.5 K/uL   RBC 3.13 (L) 4.22 - 5.81 MIL/uL   Hemoglobin 8.6 (L) 13.0 - 17.0 g/dL   HCT 69.4 (L) 60.9 - 47.9 %   MCV 97.4 80.0 - 100.0 fL   MCH 27.5 26.0 - 34.0 pg   MCHC 28.2 (L) 30.0 - 36.0 g/dL   RDW 83.7 (H) 88.4 - 84.4 %   Platelets 272 150 - 400 K/uL   nRBC 0.0 0.0 - 0.2 %    Comment: Performed at [facility] Lab, [address] [place], [place] [address]  Comprehensive metabolic panel with GFR     Status: Abnormal   Collection Time: 04/25/24 10:17 PM  Result Value Ref Range   Sodium 140 135 - 145 mmol/L    Comment: Electrolytes repeated to verify    Potassium 2.7 (LL) 3.5 - 5.1 mmol/L    Comment: Critical Value, Read Back and verified with K. Delores, RN 04/25/24 2315 by JINNY Grippe   Chloride 97 (L) 98 - 111 mmol/L   CO2 22 22 - 32 mmol/L   Glucose, Bld 185 (H) 70 - 99 mg/dL    Comment: Glucose reference range applies only to samples taken after fasting for at least 8 hours.   BUN 18 8 - 23 mg/dL   Creatinine, Ser 7.16 (H) 0.61 - 1.24 mg/dL   Calcium  9.2 8.9 - 10.3 mg/dL   Total Protein 5.5 (L) 6.5 - 8.1 g/dL   Albumin  2.6 (L) 3.5 - 5.0 g/dL   AST 20 15 - 41 U/L   ALT 9 0 - 44 U/L   Alkaline Phosphatase 145 (H) 38 - 126 U/L   Total Bilirubin 0.3 0.0 - 1.2 mg/dL   GFR, Estimated 23 (L) >60 mL/min    Comment: (NOTE) Calculated using the  CKD-EPI Creatinine Equation (2021)    Anion gap 22 (H) 5 - 15    Comment: Performed at [facility], [address] [place], [place] [address]  Troponin T, High Sensitivity     Status: Abnormal   Collection Time: 04/25/24 10:17 PM  Result Value Ref Range   Troponin T High Sensitivity 149 (HH) 0 - 19 ng/L    Comment: Critical Value, Read Back and verified with K. Delores, RN 04/25/24 2315 by JINNY Grippe (NOTE) Biotin concentrations > 1000 ng/mL falsely decrease TnT results.  Serial cardiac troponin measurements are suggested.  Refer to the Links section for chest pain algorithms and additional  guidance. Performed at [facility], [address], [place], [place] [address]   I-stat chem 8, ED     Status: Abnormal   Collection Time: 04/25/24 10:18 PM  Result Value Ref Range   Sodium 138 135 - 145 mmol/L   Potassium 2.5 (LL) 3.5 - 5.1 mmol/L   Chloride 97 (L) 98 - 111 mmol/L   BUN 20 8 - 23 mg/dL   Creatinine, Ser 7.09 (H) 0.61 - 1.24 mg/dL   Glucose, Bld 817 (H) 70 - 99 mg/dL    Comment: Glucose reference range applies only to samples taken after fasting for at least 8 hours.   Calcium , Ion 1.26 1.15 - 1.40 mmol/L   TCO2 26 22 - 32 mmol/L   Hemoglobin 10.5 (L) 13.0 - 17.0 g/dL   HCT 68.9 (L) 60.9 - 47.9 %   Comment NOTIFIED PHYSICIAN   I-Stat Lactic Acid     Status: Abnormal   Collection Time: 04/25/24  10:18 PM  Result Value Ref Range   Lactic Acid, Venous 5.3 (HH) 0.5 - 1.9 mmol/L   Comment NOTIFIED PHYSICIAN   Magnesium      Status: None   Collection Time: 04/25/24 10:22 PM  Result Value Ref Range   Magnesium  1.9 1.7 - 2.4 mg/dL    Comment: Performed at [facility], [address], [place], [place] [address]  Blood culture (routine x 2)     Status: None (Preliminary result)   Collection Time: 04/25/24 10:34 PM   Specimen: BLOOD LEFT HAND  Result Value Ref Range   Specimen Description BLOOD LEFT HAND    Special Requests AEROBIC BOTTLE ONLY Blood Culture adequate volume    Culture      NO GROWTH < 12 HOURS Performed at [facility], [address], [place], [place] [address]    Report Status PENDING   Blood culture (routine x 2)     Status: None (Preliminary result)   Collection Time: 04/25/24 10:34 PM   Specimen: BLOOD LEFT ARM  Result Value Ref Range   Specimen Description BLOOD LEFT ARM    Special Requests      BOTTLES DRAWN AEROBIC AND ANAEROBIC Blood Culture adequate volume   Culture      NO GROWTH < 12 HOURS Performed at [facility] Lab, [address], [place], [place] [address]    Report Status PENDING   Resp panel by RT-PCR (RSV, Flu A&B, Covid) Anterior Nasal Swab     Status: None   Collection Time: 04/25/24 11:24 PM   Specimen: Anterior Nasal Swab  Result Value Ref Range   SARS Coronavirus 2 by RT PCR NEGATIVE NEGATIVE   Influenza A by PCR NEGATIVE NEGATIVE   Influenza B by PCR NEGATIVE NEGATIVE    Comment: (NOTE) The Xpert Xpress SARS-CoV-2/FLU/RSV plus  assay is intended as an aid in the diagnosis of influenza from Nasopharyngeal swab specimens and should not be used as a sole basis for treatment. Nasal washings and aspirates are unacceptable for Xpert Xpress SARS-CoV-2/FLU/RSV testing.  Fact Sheet for Patients: bloggercourse.com  Fact Sheet for Healthcare  Providers: seriousbroker.it  This test is not yet approved or cleared by the [place]  FDA and has been authorized for detection and/or diagnosis of SARS-CoV-2 by FDA under an Emergency Use Authorization (EUA). This EUA will remain in effect (meaning this test can be used) for the duration of the COVID-19 declaration under Section 564(b)(1) of the Act, 21 U.S.C. section 360bbb-3(b)(1), unless the authorization is terminated or revoked.     Resp Syncytial Virus by PCR NEGATIVE NEGATIVE    Comment: (NOTE) Fact Sheet for Patients: bloggercourse.com  Fact Sheet for Healthcare Providers: seriousbroker.it  This test is not yet approved or cleared by the [place]  FDA and has been authorized for detection and/or diagnosis of SARS-CoV-2 by FDA under an Emergency Use Authorization (EUA). This EUA will remain in effect (meaning this test can be used) for the duration of the COVID-19 declaration under Section 564(b)(1) of the Act, 21 U.S.C. section 360bbb-3(b)(1), unless the authorization is terminated or revoked.  Performed at [facility], [address], [place], [place] [address]   I-Stat arterial blood gas, ED     Status: Abnormal   Collection Time: 04/26/24 12:15 AM  Result Value Ref Range   pH, Arterial 7.280 (L) 7.35 - 7.45   pCO2 arterial 50.3 (H) 32 - 48 mmHg   pO2, Arterial 431 (H) 83 - 108 mmHg   Bicarbonate 24.5 20.0 - 28.0 mmol/L   TCO2 26 22 - 32 mmol/L   O2 Saturation 100 %   Acid-base deficit 3.0 (H) 0.0 - 2.0 mmol/L   Sodium 137 135 - 145 mmol/L   Potassium 2.2 (LL) 3.5 - 5.1 mmol/L   Calcium , Ion 1.19 1.15 - 1.40 mmol/L   HCT 32.0 (L) 39.0 - 52.0 %   Hemoglobin 10.9 (L) 13.0 - 17.0 g/dL   Patient temperature 06.5 F    Collection site Web Designer by RT    Sample type ARTERIAL    Comment NOTIFIED PHYSICIAN   MRSA Next Gen by PCR, Nasal     Status: None   Collection  Time: 04/26/24  1:34 AM   Specimen: Nasal Mucosa; Nasal Swab  Result Value Ref Range   MRSA by PCR Next Gen NOT DETECTED NOT DETECTED    Comment: (NOTE) The GeneXpert MRSA Assay (FDA approved for NASAL specimens only), is one component of a comprehensive MRSA colonization surveillance program. It is not intended to diagnose MRSA infection nor to guide or monitor treatment for MRSA infections. Test performance is not FDA approved in patients less than 67 years old. Performed at [facility], [address], [place], [place] [address]   Troponin T, High Sensitivity     Status: Abnormal   Collection Time: 04/26/24  1:48 AM  Result Value Ref Range   Troponin T High Sensitivity 189 (HH) 0 - 19 ng/L    Comment: Critical value noted. Value is consistent with previously reported and called value  (NOTE) Biotin concentrations > 1000 ng/mL falsely decrease TnT results.  Serial cardiac troponin measurements are suggested.  Refer to the Links section for chest pain algorithms and additional  guidance. Performed at [facility], [address], [place],  [place] [address]   CG4 I-STAT (Lactic acid)     Status: Abnormal   Collection Time: 04/26/24  2:08 AM  Result Value Ref Range   Lactic Acid, Venous 4.0 (HH) 0.5 - 1.9 mmol/L   Comment NOTIFIED PHYSICIAN   Glucose, capillary     Status: Abnormal   Collection Time: 04/26/24  4:05 AM  Result Value Ref Range   Glucose-Capillary 194 (H) 70 - 99 mg/dL    Comment: Glucose reference range applies only to samples taken after fasting for at least 8 hours.  Triglycerides     Status: None   Collection Time: 04/26/24  4:38 AM  Result Value Ref Range   Triglycerides 67 <150 mg/dL    Comment: Performed at [facility], [address], [place], [place] [address]  CBC     Status: Abnormal   Collection Time: 04/26/24  4:38 AM  Result Value Ref Range   WBC 46.9 (H) 4.0 - 10.5 K/uL   RBC 3.16 (L) 4.22 - 5.81 MIL/uL   Hemoglobin 8.8  (L) 13.0 - 17.0 g/dL   HCT 70.8 (L) 60.9 - 47.9 %   MCV 92.1 80.0 - 100.0 fL   MCH 27.8 26.0 - 34.0 pg   MCHC 30.2 30.0 - 36.0 g/dL   RDW 84.0 (H) 88.4 - 84.4 %   Platelets 201 150 - 400 K/uL   nRBC 0.0 0.0 - 0.2 %    Comment: Performed at [facility] Lab, [address], [place], [place] [address]  Magnesium      Status: None   Collection Time: 04/26/24  4:38 AM  Result Value Ref Range   Magnesium  1.7 1.7 - 2.4 mg/dL    Comment: Performed at [facility] Lab, [address] [place], [place] [address]  Phosphorus     Status: None   Collection Time: 04/26/24  4:38 AM  Result Value Ref Range   Phosphorus 3.4 2.5 - 4.6 mg/dL    Comment: Performed at [facility] Lab, [address], [place], [place] [address]  Comprehensive metabolic panel with GFR     Status: Abnormal   Collection Time: 04/26/24  4:38 AM  Result Value Ref Range   Sodium 138 135 - 145 mmol/L    Comment: Electrolytes repeated to verify    Potassium 2.4 (LL) 3.5 - 5.1 mmol/L    Comment: Critical Value, Read Back and verified with TSOSIE BROCKS, RN [phone] 04/26/2024 SANDOVAL K    Chloride 95 (L) 98 - 111 mmol/L   CO2 21 (L) 22 - 32 mmol/L   Glucose, Bld 195 (H) 70 - 99 mg/dL    Comment: Glucose reference range applies only to samples taken after fasting for at least 8 hours.   BUN 20 8 - 23 mg/dL   Creatinine, Ser 6.91 (H) 0.61 - 1.24 mg/dL   Calcium  8.9 8.9 - 10.3 mg/dL   Total Protein 5.2 (L) 6.5 - 8.1 g/dL   Albumin  2.6 (L) 3.5 - 5.0 g/dL   AST 21 15 - 41 U/L   ALT 14 0 - 44 U/L   Alkaline Phosphatase 135 (H) 38 - 126 U/L   Total Bilirubin 0.5 0.0 - 1.2 mg/dL   GFR, Estimated 21 (L) >60 mL/min    Comment: (NOTE) Calculated using the CKD-EPI Creatinine Equation (2021)    Anion gap 22 (H) 5 - 15    Comment: Performed at [facility] Lab, [address], [place], [place] [address]  Lactic  acid, plasma     Status: Abnormal   Collection Time: 04/26/24  5:18 AM  Result Value Ref Range   Lactic Acid, Venous  4.2 (HH) 0.5 - 1.9 mmol/L    Comment: Critical Value, Read Back and verified with TSOSIE BROCKS, RN [phone] 04/26/2024 SANDOVAL K  Performed at [facility], [address] [place], [place] [address]   C Difficile Quick Screen w PCR reflex     Status: None   Collection Time: 04/26/24  6:13 AM   Specimen: STOOL  Result Value Ref Range   C Diff antigen NEGATIVE NEGATIVE   C Diff toxin NEGATIVE NEGATIVE   C Diff interpretation No C. difficile detected.     Comment: Performed at [facility] Lab, [address] [place], [place] [address]  Glucose, capillary     Status: Abnormal   Collection Time: 04/26/24  8:04 AM  Result Value Ref Range   Glucose-Capillary 168 (H) 70 - 99 mg/dL    Comment: Glucose reference range applies only to samples taken after fasting for at least 8 hours.  CG4 I-STAT (Lactic acid)     Status: Abnormal   Collection Time: 04/26/24  9:45 AM  Result Value Ref Range   Lactic Acid, Venous 3.8 (HH) 0.5 - 1.9 mmol/L   Comment NOTIFIED PHYSICIAN   Glucose, capillary     Status: Abnormal   Collection Time: 04/26/24 11:59 AM  Result Value Ref Range   Glucose-Capillary 138 (H) 70 - 99 mg/dL    Comment: Glucose reference range applies only to samples taken after fasting for at least 8 hours.    DG Chest Port 1 View Result Date: 04/26/2024 CLINICAL DATA:  Ventilator dependence. EXAM: PORTABLE CHEST 1 VIEW COMPARISON:  04/25/2024 FINDINGS: 0528 hours. Endotracheal tube tip is 3.2 cm above the base of the carina. The NG tube passes into the stomach although the distal tip position is not included on the film. The cardio pericardial silhouette is enlarged. Vascular congestion with diffuse interstitial and basilar airspace disease is similar to prior compatible with edema or infection. Small bilateral pleural effusions again noted. Telemetry leads overlie the chest. IMPRESSION: No substantial interval change. Vascular congestion with diffuse interstitial and basilar airspace  disease compatible with edema or infection. Electronically Signed   By: Camellia Candle M.D.   On: 04/26/2024 06:05   CT Angio Chest PE W and/or Wo Contrast Result Date: 04/26/2024 EXAM: CTA CHEST PE WITHOUT AND WITH CONTRAST CT ABDOMEN AND PELVIS WITHOUT AND WITH CONTRAST 04/26/2024 12:54:18 AM TECHNIQUE: CTA of the chest was performed after the administration of intravenous contrast. Multiplanar reformatted images are provided for review. MIP images are provided for review. CT of the abdomen and pelvis was performed with the administration of intravenous contrast. Automated exposure control, iterative reconstruction, and/or weight based adjustment of the mA/kV was utilized to reduce the radiation dose to as low as reasonably achievable. COMPARISON: Chest x-ray dated 04/24/2024. CLINICAL HISTORY: cardiac arrest FINDINGS: CHEST: PULMONARY ARTERIES: Pulmonary arteries are adequately opacified for evaluation. No intraluminal filling defect to suggest pulmonary embolism. Main pulmonary artery is normal in caliber. MEDIASTINUM: Enteric tube tip in the stomach. Coronary artery and aortic atherosclerotic calcification. Cardiomegaly. Reflux of contrast into the IVC and hepatic veins compatible with elevated right heart pressures. Dilated ascending aorta measuring 42 mm. No mediastinal lymphadenopathy. LUNGS AND PLEURA: Bilateral patchy airspace opacities and consolidation greatest in the lower lobes. Findings are compatible with aspiration / bronchopneumonia. Moderate right and small left  pleural effusions. No pneumothorax. SOFT TISSUES AND BONES: IO catheter in the left humerus. Nondisplaced fractures of the bilateral anterior 6th ribs. Diffuse body wall edema. ABDOMEN AND PELVIS: LIVER: No acute abnormality. GALLBLADDER AND BILE DUCTS: Cholelithiasis. No biliary ductal dilatation. SPLEEN: Spleen demonstrates no acute abnormality. PANCREAS: Pancreas demonstrates no acute abnormality. ADRENAL GLANDS: Adrenal glands  demonstrate no acute abnormality. KIDNEYS, URETERS AND BLADDER: Left nephrolithiasis measuring up to 15 mm. No stones in the right kidney or ureters. No hydronephrosis. No perinephric or periureteral stranding. Foley catheter and gas in the decompressed bladder. GI AND BOWEL: Enteric tube tip in the stomach. Small stool ball in the rectum. Normal appendix. Stomach and duodenal sweep demonstrate no acute abnormality. There is no bowel obstruction. No abnormal bowel wall thickening or distension. REPRODUCTIVE: Reproductive organs are unremarkable. PERITONEUM AND RETROPERITONEUM: Right femoral CVC tip in the infrarenal IVC at the level of the liver. Small amount of fluid in the pelvis. No ascites or free air. LYMPH NODES: No lymphadenopathy. BONES AND SOFT TISSUES: No acute abnormality of the visualized bones. No focal soft tissue abnormality. IMPRESSION: 1. Negative for pulmonary embolism. 2. Bilateral patchy airspace opacities and consolidation, greatest in the lower lobes, compatible with aspiration/bronchopneumonia, with moderate right and small left pleural effusions. 3. Nondisplaced fractures of the bilateral anterior 6th ribs. 4. Dilated ascending aorta measuring 42 mm.Recommend annual imaging followup by CTA or MRA. This recommendation follows 2010 ACCF/AHA/AATS/ACR/ASA/SCA/SCAI/SIR/STS/SVM Guidelines for the Diagnosis and Management of Patients with Thoracic Aortic Disease. Circulation. 2010; 121: Z733-z630. Aortic aneurysm NOS (ICD10-I71.9) Electronically signed by: Norman Gatlin MD 04/26/2024 01:06 AM EST RP Workstation: [id number]   CT ABDOMEN PELVIS W CONTRAST Result Date: 04/26/2024 EXAM: CTA CHEST PE WITHOUT AND WITH CONTRAST CT ABDOMEN AND PELVIS WITHOUT AND WITH CONTRAST 04/26/2024 12:54:18 AM TECHNIQUE: CTA of the chest was performed after the administration of intravenous contrast. Multiplanar reformatted images are provided for review. MIP images are provided for review. CT of the abdomen and  pelvis was performed with the administration of intravenous contrast. Automated exposure control, iterative reconstruction, and/or weight based adjustment of the mA/kV was utilized to reduce the radiation dose to as low as reasonably achievable. COMPARISON: Chest x-ray dated 04/24/2024. CLINICAL HISTORY: cardiac arrest FINDINGS: CHEST: PULMONARY ARTERIES: Pulmonary arteries are adequately opacified for evaluation. No intraluminal filling defect to suggest pulmonary embolism. Main pulmonary artery is normal in caliber. MEDIASTINUM: Enteric tube tip in the stomach. Coronary artery and aortic atherosclerotic calcification. Cardiomegaly. Reflux of contrast into the IVC and hepatic veins compatible with elevated right heart pressures. Dilated ascending aorta measuring 42 mm. No mediastinal lymphadenopathy. LUNGS AND PLEURA: Bilateral patchy airspace opacities and consolidation greatest in the lower lobes. Findings are compatible with aspiration / bronchopneumonia. Moderate right and small left pleural effusions. No pneumothorax. SOFT TISSUES AND BONES: IO catheter in the left humerus. Nondisplaced fractures of the bilateral anterior 6th ribs. Diffuse body wall edema. ABDOMEN AND PELVIS: LIVER: No acute abnormality. GALLBLADDER AND BILE DUCTS: Cholelithiasis. No biliary ductal dilatation. SPLEEN: Spleen demonstrates no acute abnormality. PANCREAS: Pancreas demonstrates no acute abnormality. ADRENAL GLANDS: Adrenal glands demonstrate no acute abnormality. KIDNEYS, URETERS AND BLADDER: Left nephrolithiasis measuring up to 15 mm. No stones in the right kidney or ureters. No hydronephrosis. No perinephric or periureteral stranding. Foley catheter and gas in the decompressed bladder. GI AND BOWEL: Enteric tube tip in the stomach. Small stool ball in the rectum. Normal appendix. Stomach and duodenal sweep demonstrate no acute abnormality. There is no bowel obstruction. No abnormal  bowel wall thickening or distension.  REPRODUCTIVE: Reproductive organs are unremarkable. PERITONEUM AND RETROPERITONEUM: Right femoral CVC tip in the infrarenal IVC at the level of the liver. Small amount of fluid in the pelvis. No ascites or free air. LYMPH NODES: No lymphadenopathy. BONES AND SOFT TISSUES: No acute abnormality of the visualized bones. No focal soft tissue abnormality. IMPRESSION: 1. Negative for pulmonary embolism. 2. Bilateral patchy airspace opacities and consolidation, greatest in the lower lobes, compatible with aspiration/bronchopneumonia, with moderate right and small left pleural effusions. 3. Nondisplaced fractures of the bilateral anterior 6th ribs. 4. Dilated ascending aorta measuring 42 mm.Recommend annual imaging followup by CTA or MRA. This recommendation follows 2010 ACCF/AHA/AATS/ACR/ASA/SCA/SCAI/SIR/STS/SVM Guidelines for the Diagnosis and Management of Patients with Thoracic Aortic Disease. Circulation. 2010; 121: Z733-z630. Aortic aneurysm NOS (ICD10-I71.9) Electronically signed by: Norman Gatlin MD 04/26/2024 01:06 AM EST RP Workstation: [id number]   CT Head Wo Contrast Result Date: 04/26/2024 CLINICAL DATA:  Provided history: Mental status change, unknown cause s/p arrest, altered ms EXAM: CT HEAD WITHOUT CONTRAST TECHNIQUE: Contiguous axial images were obtained from the base of the skull through the vertex without intravenous contrast. RADIATION DOSE REDUCTION: This exam was performed according to the departmental dose-optimization program which includes automated exposure control, adjustment of the mA and/or kV according to patient size and/or use of iterative reconstruction technique. COMPARISON:  Head CT 01/14/2024 FINDINGS: Brain: No intracranial hemorrhage, mass effect, or midline shift. Stable degree of atrophy and chronic small vessel ischemia. No hydrocephalus. The basilar cisterns are patent. No evidence of cerebral edema. No evidence of territorial infarct or acute ischemia. No extra-axial or  intracranial fluid collection. Vascular: Atherosclerosis of skullbase vasculature without hyperdense vessel or abnormal calcification. Skull: No fracture or focal lesion. Sinuses/Orbits: Mucous retention cyst in the right maxillary sinus. No acute findings. Other: None. IMPRESSION: 1. No acute intracranial abnormality. 2. Stable atrophy and chronic small vessel ischemia. Electronically Signed   By: Andrea Gasman M.D.   On: 04/26/2024 01:04   DG Abd Portable 1 View Result Date: 04/25/2024 CLINICAL DATA:  Nasogastric tube placement. EXAM: PORTABLE ABDOMEN - 1 VIEW COMPARISON:  07/01/2023 FINDINGS: Tip and side port of the enteric tube below the diaphragm in the stomach. There is gaseous gastric distension. Presumed dialysis catheter from an inferior approach overlies the expected upper IVC. Previous left-sided drainage catheter is no longer seen. Suspected left renal calculi. IMPRESSION: 1. Tip and side port of the enteric tube below the diaphragm in the stomach. 2. Gaseous gastric distension. Electronically Signed   By: Andrea Gasman M.D.   On: 04/25/2024 22:59   DG Chest Port 1 View Result Date: 04/25/2024 CLINICAL DATA:  Unresponsive.  Intubated. EXAM: PORTABLE CHEST 1 VIEW COMPARISON:  01/23/2024 FINDINGS: Endotracheal tube tip is 2.7 cm from the carina. Enteric tube tip below the diaphragm not included in the field of view. Cardiomegaly is stable. Unchanged mediastinal contours. Aortic atherosclerosis. Bilateral pleural effusions and bibasilar opacities, worsened from prior exam. Vascular congestion. No pneumothorax. IMPRESSION: 1. Endotracheal tube tip 2.7 cm from the carina. 2. Bilateral pleural effusions and bibasilar opacities, worsened from prior exam. 3. Cardiomegaly and vascular congestion. Electronically Signed   By: Andrea Gasman M.D.   On: 04/25/2024 22:58    ROS: Unable to obtain review of system as patient is currently intubated and sedated. Blood pressure 112/71, pulse 81,  temperature (!) 95.9 F (35.5 C), resp. rate (!) 22, height 5' 8 (1.727 m), weight 100.7 kg, SpO2 100%. Gen: Intubated, sedated Respiratory: Coarse  breath sound bilateral.  Cardiovascular: Regular rate rhythm S1-S2 normal, no rubs GI: Abdomen soft,  nondistended Extremities, no cyanosis, no edema Skin: No rash or ulcer Neurology:  sedated.   Dialysis Access: Right groin tunneled HD catheter.  Assessment/Plan:  # Cardiac arrest, PEA thought to be due to hypokalemia.  However EKG with normal QT interval seen by cardiologist.  CT angio with no PE.  Getting echocardiogram  # ESRD: MWF at [facility], last HD on 12/21 due to holiday schedule.  Outpatient potassium level was 5.  No need for dialysis today.  He will need either CRRT or IHD tomorrow.  # Cardiogenic shock currently on epinephrine .  Monitor BP.  # Anemia of ESRD: Hemoglobin around 8.6-10.9, received Mircera on 12/21.  Monitor hemoglobin and transfuse as needed.  # Hypokalemia: Last outpatient potassium level was around 5.  Receiving multiple rounds of potassium chloride  for hypokalemia in the hospital.  BMP is in progress now.  Monitor lab.  # CKD-MBD: Monitor calcium  phosphorus  Discussed with ICU team and patient's wife.  Odai Wimmer Amelie Romney 04/26/2024, 12:06 PM      [1] No Known Allergies

## 2024-04-26 NOTE — Progress Notes (Addendum)
 "  NAME:  Calvin Schultz, MRN:  [id number], DOB:  17-Jan-1951, LOS: 1 ADMISSION DATE:  04/25/2024, CONSULTATION DATE:  04/25/2024 REFERRING MD:  , CHIEF COMPLAINT:  Cardiac arrest  History of Present Illness:  73 y/o male with PMH for DMT2, ESRD on HD T/TH/S, HTN, A fib, HLD, Chronic LE wounds, C diff and edema, who was in his usual state of health and with his wife and went to turn off the fireplace and suddenly went blank and wife pulled him out of his wheelchair and started CPR and called EMS who continued CPR.  Initial reports suggest PEA arrest.  He was dialysis yesterday, normally MWF and recently switched to T/TH/S.  Intubated in the field.  In the ED he was found to have K: 2.5, Cr 2.90, BNP 640, Trop 149 and LA 5.3.  He was placed on Epi drip.,  POCUS at bedside, no tamponade, LV function seems intact, enlarged right heart and septum irregularity.  Pertinent  Medical History  DMT2, ESRD on HD T/TH/S, HTN, A fib, HLD, Chronic LE wounds, C diff and edema  Significant Hospital Events: Including procedures, antibiotic start and stop dates in addition to other pertinent events   12/22: Admit to ICU due to [facility]  12/23 Patient intubated, on epi, prop, hypokalemic, no cough/gag   Interim History / Subjective:  Intubated  Showing signs of myoclonus   No cough/gag  Objective    Blood pressure 125/64, pulse 73, temperature (!) 95.9 F (35.5 C), resp. rate (!) 25, height 5' 8 (1.727 m), weight 100.7 kg, SpO2 100%.    Vent Mode: PRVC FiO2 (%):  [40 %-100 %] 40 % Set Rate:  [18 bmp-22 bmp] 22 bmp Vt Set:  [550 mL] 550 mL PEEP:  [5 cmH20] 5 cmH20 Plateau Pressure:  [13 cmH20-31 cmH20] 30 cmH20   Intake/Output Summary (Last 24 hours) at 04/26/2024 1043 Last data filed at 04/26/2024 1000 Gross per 24 hour  Intake 1539.75 ml  Output --  Net 1539.75 ml   Filed Weights   04/26/24 0324  Weight: 100.7 kg    Examination: General: acute on chronic lying in icu bed on vent  HEENT:  Normocephalic, PERRLA intact, ETT, OG, Pink MM CV: s1,s2, RRR, no MRG, No JVD  pulm: clear, diminished, no distress on vent  Abs: bs active, soft  Extremities: generalized edema, B/L lower extremity bandage wrappings up to knees, right HD cath  Skin: no rash  Neuro: Rass -3, no cough/gag, no response to painful stimuli, myoclonic jerking of face GU: foley intact- dark/brown    Resolved problem list   Assessment and Plan  Cardiac arrest-witnessed out of hospital suspect related to electrolyte imbalances- hypokalemia Cardiogenic vs Septic shock, more than likely multifactorial- patient has multiple sources of infection- chronic leg wound infection, UTI, and possible aspiration PNA  Lactic Acidosis- trending down  Concern of Anoxic Brain Injury Myoclonus  CT angio of Chest- negative for PE, possible aspiration PNA CT abdomen/pelvis- noted 42 mm dilated descending Aorta CT head negative for acute intracranial abnormality  Echo pending P:  Continue to replace potassium, trend K and Mag  Continue cardiac telemetry, having frequent PVCs  Continue normothermia protocol, currently hypothermic, continue utilization of bear hugger Obtain ECHO  MAP goal > 65, continue to wean down epi, hold off on starting stress steroids, since on low dose epi  Continue propofol   Add keppra  maintenance dosing  Obtain tracheal aspirate, continue to follow cultures  Continue to trend lactic acid  Will  need MRI in 72hrs to evaluate for anoxia   Acute hypoxic respiratory failure Concern for Aspiration Pneumonia P:  Continue ventilator support and lung protective strategies  Continue LTVV  Wean PEEP and Fio2 requirements to sat goal of >92%  HOB > 30 degrees Plat < 30  Aim for Driving pressures < 15  Intermittent Chest X-ray and ABGS Obtain and follow cultures-blood and tracheal aspirate VAP and PAD protocols in place   ESRD on HD- T/TH/S Hypokalemia  P: Continue to trend renal function daily   Continue to monitor and optimize electrolytes daily Continue to monitor urine output Continue strict I/Os Continue Adequate renal perfusion  Avoid nephrotoxic agents  Nephrology consulted, following   UTI  UA- many bacteria, leukocytes  P: Continue abx as above- zosyn /vanc  Trend urine output  Follow urine culture     Labs   CBC: Recent Labs  Lab 04/25/24 2217 04/25/24 2218 04/26/24 0015 04/26/24 0438  WBC 34.3*  --   --  46.9*  HGB 8.6* 10.5* 10.9* 8.8*  HCT 30.5* 31.0* 32.0* 29.1*  MCV 97.4  --   --  92.1  PLT 272  --   --  201    Basic Metabolic Panel: Recent Labs  Lab 04/25/24 2217 04/25/24 2218 04/25/24 2222 04/26/24 0015 04/26/24 0438  NA 140 138  --  137 138  K 2.7* 2.5*  --  2.2* 2.4*  CL 97* 97*  --   --  95*  CO2 22  --   --   --  21*  GLUCOSE 185* 182*  --   --  195*  BUN 18 20  --   --  20  CREATININE 2.83* 2.90*  --   --  3.08*  CALCIUM  9.2  --   --   --  8.9  MG  --   --  1.9  --  1.7  PHOS  --   --   --   --  3.4   GFR: Estimated Creatinine Clearance: 25.3 mL/min (A) (by C-G formula based on SCr of 3.08 mg/dL (H)). Recent Labs  Lab 04/25/24 2217 04/25/24 2218 04/26/24 0208 04/26/24 0438 04/26/24 0518 04/26/24 0945  WBC 34.3*  --   --  46.9*  --   --   LATICACIDVEN  --  5.3* 4.0*  --  4.2* 3.8*    Liver Function Tests: Recent Labs  Lab 04/25/24 2217 04/26/24 0438  AST 20 21  ALT 9 14  ALKPHOS 145* 135*  BILITOT 0.3 0.5  PROT 5.5* 5.2*  ALBUMIN  2.6* 2.6*   No results for input(s): LIPASE, AMYLASE in the last 168 hours. No results for input(s): AMMONIA in the last 168 hours.  ABG    Component Value Date/Time   PHART 7.280 (L) 04/26/2024 0015   PCO2ART 50.3 (H) 04/26/2024 0015   PO2ART 431 (H) 04/26/2024 0015   HCO3 24.5 04/26/2024 0015   TCO2 26 04/26/2024 0015   ACIDBASEDEF 3.0 (H) 04/26/2024 0015   O2SAT 100 04/26/2024 0015     Coagulation Profile: No results for input(s): INR, PROTIME in the last  168 hours.  Cardiac Enzymes: No results for input(s): CKTOTAL, CKMB, CKMBINDEX, TROPONINI in the last 168 hours.  HbA1C: Hgb A1c MFr Bld  Date/Time Value Ref Range Status  01/15/2024 08:58 AM 5.6 4.8 - 5.6 % Final    Comment:    (NOTE) Diagnosis of Diabetes The following HbA1c ranges recommended by the American Diabetes Association (ADA) may be used as  an aid in the diagnosis of diabetes mellitus.  Hemoglobin             Suggested A1C NGSP%              Diagnosis  <5.7                   Non Diabetic  5.7-6.4                Pre-Diabetic  >6.4                   Diabetic  <7.0                   Glycemic control for                       adults with diabetes.    06/26/2023 04:48 PM 6.2 (H) 4.8 - 5.6 % Final    Comment:    (NOTE) Pre diabetes:          5.7%-6.4%  Diabetes:              >6.4%  Glycemic control for   <7.0% adults with diabetes     CBG: Recent Labs  Lab 04/25/24 2213 04/26/24 0405 04/26/24 0804  GLUCAP 174* 194* 168*    Review of Systems:   Intubated and sedated  Past Medical History:  He,  has a past medical history of Arthritis, Atrial fibrillation (HCC), CKD (chronic kidney disease) stage 3, GFR 30-59 ml/min (HCC), Diabetes mellitus without complication (HCC), Dysrhythmia, Hyperlipidemia, and Hypertension.   Surgical History:   Past Surgical History:  Procedure Laterality Date   AV FISTULA PLACEMENT Left 10/09/2023   Procedure: ARTERIOVENOUS (AV) FISTULA CREATION, FIRST STAGE BASILIC;  Surgeon: Pearline Norman RAMAN, MD;  Location: [facility] OR;  Service: Vascular;  Laterality: Left;  LEFT ARM AVF VERSUS AVG   CYSTOSCOPY W/ URETERAL STENT PLACEMENT Right 08/05/2023   Procedure: CYSTOSCOPY, WITH RETROGRADE PYELOGRAM AND URETERAL STENT INSERTION;  Surgeon: Roseann Adine PARAS., MD;  Location: [facility] OR;  Service: Urology;  Laterality: Right;   CYSTOSCOPY/URETEROSCOPY/HOLMIUM LASER/STENT PLACEMENT Left 07/24/2023   Procedure:  CYSTOSCOPY/URETEROSCOPY/HOLMIUM LASER/ STENT PLACEMENT/REMOVAL OF NEPHROSTOMY TUBE;  Surgeon: Nieves Cough, MD;  Location: [facility] ORS;  Service: Urology;  Laterality: Left;  90 MINUTE CASE   CYSTOSCOPY/URETEROSCOPY/HOLMIUM LASER/STENT PLACEMENT Bilateral 08/18/2023   Procedure: CYSTOSCOPY/URETEROSCOPY/HOLMIUM LASER/STENT PLACEMENT;  Surgeon: Nieves Cough, MD;  Location: [facility] OR;  Service: Urology;  Laterality: Bilateral;   DIALYSIS/PERMA CATHETER INSERTION N/A 01/20/2024   Procedure: DIALYSIS/PERMA CATHETER INSERTION;  Surgeon: Lanis Fonda BRAVO, MD;  Location: [facility] INVASIVE CV LAB;  Service: Cardiovascular;  Laterality: N/A;   INSERTION OF DIALYSIS CATHETER Right 10/09/2023   Procedure: INSERTION OF DIALYSIS CATHETER, USING PALINDROME CATHETER KIT 19CM;  Surgeon: Pearline Norman RAMAN, MD;  Location: [facility] OR;  Service: Vascular;  Laterality: Right;  TUNNELED DIALYSIS CATHETER   IR NEPHROSTOMY PLACEMENT LEFT  06/03/2023   KIDNEY SURGERY     TRANSESOPHAGEAL ECHOCARDIOGRAM (CATH LAB) N/A 01/19/2024   Procedure: TRANSESOPHAGEAL ECHOCARDIOGRAM;  Surgeon: Shlomo Wilbert SAUNDERS, MD;  Location: [facility] INVASIVE CV LAB;  Service: Cardiovascular;  Laterality: N/A;   TUNNELLED CATHETER EXCHANGE N/A 11/20/2023   Procedure: TUNNELLED CATHETER EXCHANGE;  Surgeon: Sheree Penne Bruckner, MD;  Location: [facility] PV LAB;  Service: Cardiovascular;  Laterality: N/A;     Social History:   reports that he has never smoked. He has never used smokeless tobacco. He reports that he does not drink  alcohol  and does not use drugs.   Family History:  His family history includes Dementia in his mother.   Allergies Allergies[1]   Home Medications  Prior to Admission medications  Medication Sig Start Date End Date Taking? Authorizing Provider  acetaminophen  (TYLENOL ) 325 MG tablet Take 2 tablets (650 mg total) by mouth every 4 (four) hours as needed for mild pain (pain score 1-3) or fever. 08/05/23   Angiulli, Toribio PARAS, PA-C  docusate sodium   (COLACE) 100 MG capsule Take 1 capsule (100 mg total) by mouth 2 (two) times daily as needed for mild constipation. Patient not taking: Reported on 03/01/2024 01/23/24   Tobie Yetta HERO, MD  gabapentin  (NEURONTIN ) 100 MG capsule Take 100 mg by mouth at bedtime. 12/29/23   [provider]  iron  polysaccharides (NIFEREX) 150 MG capsule Take 1 capsule (150 mg total) by mouth daily. 07/11/23   Vann, Jessica U, DO  LANTUS SOLOSTAR 100 UNIT/ML Solostar Pen Inject 3-4 Units into the skin daily as needed (for hyperglycemia). 11/02/23   [provider]  midodrine  (PROAMATINE ) 10 MG tablet Take 1 tablet (10 mg total) by mouth 3 (three) times daily with meals. 01/23/24   Patel, Pranav M, MD  multivitamin (RENA-VIT) TABS tablet Take 1 tablet by mouth at bedtime. 08/05/23   Angiulli, Toribio PARAS, PA-C  Nutritional Supplements (,FEEDING SUPPLEMENT, PROSOURCE PLUS) liquid Take 30 mLs by mouth 2 (two) times daily between meals. Patient not taking: Reported on 03/01/2024 01/23/24   Tobie Yetta HERO, MD  Nutritional Supplements (FEEDING SUPPLEMENT, NEPRO CARB STEADY,) LIQD Take 237 mLs by mouth 3 (three) times daily between meals. Patient not taking: Reported on 03/01/2024 01/23/24   Tobie Yetta HERO, MD  Nystatin (GERHARDT'S BUTT CREAM) CREA Apply 1 Application topically 2 (two) times daily. 08/20/23   Angiulli, Toribio PARAS, PA-C  oxyCODONE -acetaminophen  (PERCOCET) 10-325 MG tablet Take 1 tablet by mouth every 6 (six) hours as needed for pain. Patient not taking: Reported on 03/01/2024 01/23/24 01/22/25  Patel, Pranav M, MD  polyethylene glycol (MIRALAX  / GLYCOLAX ) 17 g packet Take 17 g by mouth daily. Patient not taking: Reported on 03/01/2024 01/23/24   Tobie Yetta HERO, MD  sevelamer  carbonate (RENVELA ) 800 MG tablet Take 2 tablets (1,600 mg total) by mouth 3 (three) times daily with meals. 01/23/24   Tobie Yetta HERO, MD  silver  sulfADIAZINE  (SILVADENE ) 1 % cream Apply 1 Application topically daily. Apply to legs  11/21/23 07/10/24  [provider]  tamsulosin  (FLOMAX ) 0.4 MG CAPS capsule Take 1 capsule (0.4 mg total) by mouth daily after supper. 10/16/23   Tobie Yetta HERO, MD  tiZANidine  (ZANAFLEX ) 4 MG tablet Take 1 tablet (4 mg total) by mouth 2 (two) times daily as needed for muscle spasms. 08/05/23   Angiulli, Toribio PARAS, PA-C  traZODone  (DESYREL ) 50 MG tablet Take 1 tablet (50 mg total) by mouth at bedtime as needed for sleep. 10/16/23   Tobie Yetta HERO, MD     Critical care time: 45 mins     [facility]   [place] [facility] 04/26/2024, 2:48 PM  Please see [email or web address] for pager details.  From 7A-7P if no response, please call [phone]. After hours, please call ELink [phone].     Critical care attending attestation note: I agree with the Advanced Practitioner's note, impression, and recommendations as outlined. I have taken an independent interval history, reviewed the chart and examined the patient. The following reflects my medical decision making and independent critical care time  Synopsis of assessment and plan: 73 year old male here with status post PEA cardiac arrest at home, noted to be hypokalemic with potassium 2.4, that could be likely cause of cardiac arrest, complicated with acute respiratory failure with hypoxia and hypercapnia in the setting of bilateral multifocal pneumonia, likely aspiration.  Patient is having stimulus induced myoclonus   Physical exam: General: Critically ill-appearing male, orally intubated HEENT: [place]/AT, eyes anicteric.  ETT and OGT in place Neuro: Opens eyes with stimulus, started with generalized myoclonus Chest: Lateral crackles at bases Heart: Regular rate and rhythm, no murmurs or gallops Abdomen: Soft, nondistended, bowel sounds present   Labs and images were reviewed  Assessment and plan: Status post out-of-hospital PEA cardiac arrest, likely due to hypokalemia Probable anoxic encephalopathy Status  myoclonus End-stage renal disease on hemodialysis Hypokalemia Septic shock due to bilateral aspiration pneumonia Leukemoid reaction Lactic acidosis Acute urinary tract infection  Continue supportive care Patient is having status myoclonus Continue propofol  infusion Continue video EEG Neurology is following Nephrology is consulting, probably patient will have hemodialysis tomorrow Hypokalemia is corrected Continue vasopressor support with MAP goal 65 Continue broad-spectrum antibiotics Follow-up cultures Trend CBC and lactate Trend ABGs  Overall prognosis guarded  This patient is critically ill with multiple organ system failure which requires frequent high complexity decision making, assessment, support, evaluation, and titration of therapies. This was completed through the application of advanced monitoring technologies and extensive interpretation of multiple databases.  During this encounter critical care time was devoted to patient care services described in this note for 30 minutes.    Valinda Novas, MD [facility] See Amion for pager If no response to pager, please call [phone] until 7pm After 7pm, Please call E-link [phone]   04/26/2024, 4:53 PM      [1] No Known Allergies  "

## 2024-04-26 NOTE — Progress Notes (Addendum)
 Pharmacy Antibiotic Note  Calvin Schultz is a 73 y.o. male admitted on 04/25/2024 with OOH cardiac arrest.  Pharmacy has been consulted for vancomycin  dosing for aspiration penumonia.  -WBC 47, ESRD HD (last session 12/21), hypothermic -Blood cultures collected, MRSA PCR negative -CTAchest: aspiration/bronchopneumonia  -9/11 MSSA bacteremia completed course of cefazolin   Plan: -Ceftriaxone  2g IV every 24 hours -Vancomycin  2000mg  IV x1 -Vancomycin  variable until hemodialysis scheduled inpatient -Follow up signs of clinical improvement, LOT, de-escalation of antibiotics   Height: 5' 8 (172.7 cm) Weight: 100.7 kg (222 lb 0.1 oz) IBW/kg (Calculated) : 68.4  Temp (24hrs), Avg:93.5 F (34.2 C), Min:92.3 F (33.5 C), Max:94.7 F (34.8 C)  Recent Labs  Lab 04/25/24 2217 04/25/24 2218 04/26/24 0208 04/26/24 0438 04/26/24 0518  WBC 34.3*  --   --  46.9*  --   CREATININE 2.83* 2.90*  --  3.08*  --   LATICACIDVEN  --  5.3* 4.0*  --  4.2*    Estimated Creatinine Clearance: 25.3 mL/min (A) (by C-G formula based on SCr of 3.08 mg/dL (H)).    Allergies[1]  Antimicrobials this admission: Ceftriaxone  12/22 >>  Vancomycin  12/23 >>   Microbiology results: 12/22 BCx:  12/22 MRSA PCR: negative  Thank you for allowing pharmacy to be a part of this patients care.  Lynwood Poplar, PharmD, BCPS Clinical Pharmacist 04/26/2024 6:20 AM       [1] No Known Allergies

## 2024-04-27 ENCOUNTER — Inpatient Hospital Stay (HOSPITAL_COMMUNITY)

## 2024-04-27 DIAGNOSIS — R569 Unspecified convulsions: Secondary | ICD-10-CM | POA: Diagnosis not present

## 2024-04-27 LAB — GLUCOSE, CAPILLARY
Glucose-Capillary: 177 mg/dL — ABNORMAL HIGH (ref 70–99)
Glucose-Capillary: 107 mg/dL — ABNORMAL HIGH (ref 70–99)
Glucose-Capillary: 114 mg/dL — ABNORMAL HIGH (ref 70–99)
Glucose-Capillary: 101 mg/dL — ABNORMAL HIGH (ref 70–99)
Glucose-Capillary: 101 mg/dL — ABNORMAL HIGH (ref 70–99)
Glucose-Capillary: 92 mg/dL (ref 70–99)

## 2024-04-27 LAB — RENAL FUNCTION PANEL
Albumin: 2.5 g/dL — ABNORMAL LOW (ref 3.5–5.0)
Anion gap: 17 — ABNORMAL HIGH (ref 5–15)
BUN: 26 mg/dL — ABNORMAL HIGH (ref 8–23)
CO2: 22 mmol/L (ref 22–32)
Calcium: 8.8 mg/dL — ABNORMAL LOW (ref 8.9–10.3)
Chloride: 95 mmol/L — ABNORMAL LOW (ref 98–111)
Creatinine, Ser: 2.95 mg/dL — ABNORMAL HIGH (ref 0.61–1.24)
GFR, Estimated: 22 mL/min — ABNORMAL LOW
Glucose, Bld: 104 mg/dL — ABNORMAL HIGH (ref 70–99)
Phosphorus: 4.2 mg/dL (ref 2.5–4.6)
Potassium: 4.5 mmol/L (ref 3.5–5.1)
Sodium: 134 mmol/L — ABNORMAL LOW (ref 135–145)

## 2024-04-27 LAB — POCT I-STAT 7, (LYTES, BLD GAS, ICA,H+H)
Acid-base deficit: 3 mmol/L — ABNORMAL HIGH (ref 0.0–2.0)
Bicarbonate: 21 mmol/L (ref 20.0–28.0)
Calcium, Ion: 1.16 mmol/L (ref 1.15–1.40)
HCT: 26 % — ABNORMAL LOW (ref 39.0–52.0)
Hemoglobin: 8.8 g/dL — ABNORMAL LOW (ref 13.0–17.0)
O2 Saturation: 100 %
Patient temperature: 35.5
Potassium: 4.5 mmol/L (ref 3.5–5.1)
Sodium: 132 mmol/L — ABNORMAL LOW (ref 135–145)
TCO2: 22 mmol/L (ref 22–32)
pCO2 arterial: 29.3 mmHg — ABNORMAL LOW (ref 32–48)
pH, Arterial: 7.458 — ABNORMAL HIGH (ref 7.35–7.45)
pO2, Arterial: 171 mmHg — ABNORMAL HIGH (ref 83–108)

## 2024-04-27 LAB — BASIC METABOLIC PANEL WITH GFR
Anion gap: 20 — ABNORMAL HIGH (ref 5–15)
BUN: 27 mg/dL — ABNORMAL HIGH (ref 8–23)
CO2: 21 mmol/L — ABNORMAL LOW (ref 22–32)
Calcium: 9 mg/dL (ref 8.9–10.3)
Chloride: 94 mmol/L — ABNORMAL LOW (ref 98–111)
Creatinine, Ser: 3.32 mg/dL — ABNORMAL HIGH (ref 0.61–1.24)
GFR, Estimated: 19 mL/min — ABNORMAL LOW
Glucose, Bld: 150 mg/dL — ABNORMAL HIGH (ref 70–99)
Potassium: 5 mmol/L (ref 3.5–5.1)
Sodium: 135 mmol/L (ref 135–145)

## 2024-04-27 LAB — CBC
HCT: 26.3 % — ABNORMAL LOW (ref 39.0–52.0)
Hemoglobin: 8.3 g/dL — ABNORMAL LOW (ref 13.0–17.0)
MCH: 27.7 pg (ref 26.0–34.0)
MCHC: 31.6 g/dL (ref 30.0–36.0)
MCV: 87.7 fL (ref 80.0–100.0)
Platelets: 238 K/uL (ref 150–400)
RBC: 3 MIL/uL — ABNORMAL LOW (ref 4.22–5.81)
RDW: 16.2 % — ABNORMAL HIGH (ref 11.5–15.5)
WBC: 44.7 K/uL — ABNORMAL HIGH (ref 4.0–10.5)
nRBC: 0 % (ref 0.0–0.2)

## 2024-04-27 LAB — HEMOGLOBIN A1C
Hgb A1c MFr Bld: 5.3 % (ref 4.8–5.6)
Mean Plasma Glucose: 105.41 mg/dL

## 2024-04-27 MED ORDER — PRISMASOL BGK 4/2.5 32-4-2.5 MEQ/L EC SOLN: Status: DC

## 2024-04-27 MED ORDER — LINEZOLID 600 MG PO TABS
600.0000 mg | ORAL_TABLET | Freq: Two times a day (BID) | ORAL | Status: DC
  Administered 2024-04-27: 600 mg via ORAL
  Filled 2024-04-27: qty 1

## 2024-04-27 MED ORDER — SODIUM CHLORIDE 0.9 % IV SOLN
500.0000 [IU]/h | INTRAVENOUS | Status: DC
  Administered 2024-04-27 – 2024-04-29 (×3): 500 [IU]/h via INTRAVENOUS_CENTRAL
  Filled 2024-04-27: qty 2
  Filled 2024-04-27: qty 10000
  Filled 2024-04-27: qty 2

## 2024-04-27 MED ORDER — POLYETHYLENE GLYCOL 3350 17 G PO PACK: 17.0000 g | PACK | Freq: Every day | ORAL | Status: DC | PRN

## 2024-04-27 MED ORDER — DOCUSATE SODIUM 50 MG/5ML PO LIQD: 100.0000 mg | Freq: Two times a day (BID) | ORAL | Status: DC | PRN

## 2024-04-27 MED ORDER — HEPARIN SODIUM (PORCINE) 1000 UNIT/ML DIALYSIS
1000.0000 [IU] | INTRAMUSCULAR | Status: DC | PRN
  Filled 2024-04-27: qty 6

## 2024-04-27 MED ORDER — LINEZOLID 600 MG PO TABS
600.0000 mg | ORAL_TABLET | Freq: Two times a day (BID) | ORAL | Status: DC
  Administered 2024-04-28: 600 mg
  Filled 2024-04-27: qty 1

## 2024-04-27 NOTE — Progress Notes (Signed)
 [facility] NEPHROLOGY PROGRESS NOTE  Assessment/ Plan: Pt is a 74 y.o. yo male  with past medical history significant for hypertension, dyslipidemia, diabetes, A-fib, ESRD on HD who was admitted with PEA cardiac arrest, seen as a consult for the management of ESRD.  OP HD orders:  Dialyzes at [facility], MWF, EDW: 97.4 kg, 4 hr, 2k 2.5 ca, last OP HD on 12/21, R groin TDC, Mircera 225 mcg on 12/21 Sod thiosulfate at HD.  # Cardiac arrest, PEA thought to be due to hypokalemia.  However EKG with normal QT interval, seen by cardiologist.  CT angio with no PE.  Echo with EF of 50 to 55% EF.   # ESRD: MWF at [facility], last HD on 12/21 due to holiday schedule.  Outpatient potassium level was 5. Currently on epinephrine  with soft BP.  Exam consistent with some peripheral edema.  We will start CRRT today.  Discussed with patient's wife and ICU team. All 4K bath, limited dose of heparin , UF goal 50-150 cc/per hour as tolerated by BP.   # Cardiogenic shock currently on epinephrine .  Monitor BP.   # Anemia of ESRD: Hemoglobin around 8.6-10.9, received Mircera on 12/21.  Monitor hemoglobin and transfuse as needed.   # Hypokalemia: Last outpatient potassium level was around 5.  Received potassium repletion.  CRRT with 4K bath and monitor lab.   # CKD-MBD: Monitor calcium  phosphorus.  # Anoxic brain injury: EEG with myoclonic seizure and profound diffuse encephalopathy consistent with anoxic brain injury.  Neurology team is following.   Subjective: Seen and examined.  Remains intubated, sedated and on a small dose of epinephrine .  Blood pressure soft.  Noted EEG with anoxic brain injury.  His wife present at the bedside. Objective Vital signs in last 24 hours: Vitals:   04/27/24 0745 04/27/24 0800 04/27/24 0801 04/27/24 0815  BP:  105/66    Pulse: 74  76 67  Resp: (!) 22  (!) 22 (!) 22  Temp: (!) 96.4 F (35.8 C)  (!) 96.4 F (35.8 C) (!) 96.4 F (35.8 C)  TempSrc:       SpO2: 100%  100% 100%  Weight:      Height:       Weight change: 5.6 kg  Intake/Output Summary (Last 24 hours) at 04/27/2024 1007 Last data filed at 04/27/2024 0800 Gross per 24 hour  Intake 1899.57 ml  Output 390 ml  Net 1509.57 ml       Labs: RENAL PANEL Recent Labs  Lab 04/25/24 2217 04/25/24 2218 04/25/24 2222 04/26/24 0015 04/26/24 0438 04/26/24 1201 04/26/24 1831 04/27/24 0444  NA 140 138  --  137 138 137 134* 135  K 2.7* 2.5*  --  2.2* 2.4* 3.7 4.3 5.0  CL 97* 97*  --   --  95* 95*  --  94*  CO2 22  --   --   --  21* 20*  --  21*  GLUCOSE 185* 182*  --   --  195* 149*  --  150*  BUN 18 20  --   --  20 22  --  27*  CREATININE 2.83* 2.90*  --   --  3.08* 3.12*  --  3.32*  CALCIUM  9.2  --   --   --  8.9 9.0  --  9.0  MG  --   --  1.9  --  1.7 2.0  --   --   PHOS  --   --   --   --  3.4  --   --   --   ALBUMIN  2.6*  --   --   --  2.6*  --   --   --     Liver Function Tests: Recent Labs  Lab 04/25/24 2217 04/26/24 0438  AST 20 21  ALT 9 14  ALKPHOS 145* 135*  BILITOT 0.3 0.5  PROT 5.5* 5.2*  ALBUMIN  2.6* 2.6*   No results for input(s): LIPASE, AMYLASE in the last 168 hours. No results for input(s): AMMONIA in the last 168 hours. CBC: Recent Labs    06/03/23 0647 06/04/23 0503 06/04/23 1126 06/05/23 0608 07/03/23 1514 07/04/23 0140 10/02/23 1145 10/03/23 0336 10/05/23 0432 10/06/23 0507 10/11/23 9287 10/12/23 0547 01/18/24 0454 01/19/24 0426 04/25/24 2218 04/26/24 0015 04/26/24 0438 04/26/24 1831 04/27/24 0444  HGB 10.2*   < >  --    < >  --    < > 7.6*   < > 8.1*   < > 8.5*   < > 7.8*   < > 10.5* 10.9* 8.8* 10.2* 8.3*  MCV 85.5   < >  --    < >  --    < >  --    < > 86.5   < > 90.0   < > 93.3   < >  --   --  92.1  --  87.7  VITAMINB12  --   --   --   --   --   --   --   --  635  --   --   --  1,000*  --   --   --   --   --   --   FOLATE  --   --   --   --   --   --  21.8  --   --   --   --   --  12.9  --   --   --   --   --    --   FERRITIN  --   --  32  --  88  --  76  --   --   --  115  --  1,284*  --   --   --   --   --   --   TIBC 384  --   --   --  179*  --  249*  --   --   --  224*  --  192*  --   --   --   --   --   --   IRON  45  --   --   --  35*  --  50  --   --   --  37*  --  52  --   --   --   --   --   --   RETICCTPCT  --   --   --   --   --   --  1.5  --   --   --   --   --   --   --   --   --   --   --   --    < > = values in this interval not displayed.    Cardiac Enzymes: No results for input(s): CKTOTAL, CKMB, CKMBINDEX, TROPONINI in the last 168 hours. CBG: Recent Labs  Lab 04/26/24 1534 04/26/24 2015 04/26/24 2323 04/27/24 0344 04/27/24 0810  GLUCAP 107* 105*  105* 107* 114*    Iron  Studies: No results for input(s): IRON , TIBC, TRANSFERRIN, FERRITIN in the last 72 hours. Studies/Results: Overnight EEG with video Result Date: 04/27/2024 Calvin Arlin KIDD, MD     04/27/2024  9:15 AM Patient Name: Calvin Schultz MRN: [id number] Epilepsy Attending: Arlin Schultz Calvin Referring Physician/Provider:  Dr Calvin Schultz Duration: 04/26/2024 1202 to 04/27/2024 0845  Patient history: 73yo M s/p cardiac arrest. EEG to evaluate for seizure  Level of alertness: comatose  AEDs during EEG study: Propofol   Technical aspects: This EEG study was done with scalp electrodes positioned according to the 10-20 International system of electrode placement. Electrical activity was reviewed with band pass filter of 1-70Hz , sensitivity of 7 uV/mm, display speed of 53mm/sec with a 60Hz  notched filter applied as appropriate. EEG data were recorded continuously and digitally stored.  Video monitoring was available and reviewed as appropriate.  Description: At the beginning of the study, patient was noted to have episodes of sudden eye opening with whole body jerking lasting 8-10seconds every few seconds. Concomitant EEG showed generalized polyspikes consistent with myoclonic seizures. In between seizures EEG showed  generalized background suppression. Gradually as medications were adjusted, seizures abated and eeg showed burst suppression with bursts of generalized 4-5hz  theta slowing lasting 0.5 to 1 second with interburst interval of 3-5 seconds. Hyperventilation and photic stimulation were not performed.    ABNORMALITY - Myoclonic seizure, generalized - Burst suppression, generalized  IMPRESSION: At the beginning of the study, patient was noted to have myoclonic seizures every few seconds. Gradually as medications were adjusted, seizures abated. Subsequently, there was profound diffuse encephalopathy.   Arlin Schultz Calvin    ECHOCARDIOGRAM COMPLETE Result Date: 04/26/2024    ECHOCARDIOGRAM REPORT   Patient Name:   Calvin Schultz Date of Exam: 04/26/2024 Medical Rec #:  [id number]   Height:       68.0 in Accession #:    [id number]  Weight:       222.0 lb Date of Birth:  11-01-1950  BSA:          2.136 m Patient Age:    73 years    BP:           115/66 mmHg Patient Gender: M           HR:           83 bpm. Exam Location:  Inpatient Procedure: 2D Echo, Cardiac Doppler, Color Doppler and Intracardiac            Opacification Agent (Both Spectral and Color Flow Doppler were            utilized during procedure). Indications:    Cardiac Arrest  History:        Patient has prior history of Echocardiogram examinations, most                 recent 01/17/2024. Arrythmias:Atrial Fibrillation; Risk                 Factors:Hypertension and Diabetes.  Sonographer:    Philomena Daring Referring Phys: [id number] STEPHANIE M REESE IMPRESSIONS  1. Left ventricular ejection fraction, by estimation, is 50 to 55%. The left ventricle has low normal function. The left ventricle has no regional wall motion abnormalities. There is mild concentric left ventricular hypertrophy. Left ventricular diastolic parameters are indeterminate. There is the interventricular septum is flattened in systole, consistent with right ventricular pressure overload.  2. Right  ventricular systolic function is moderately reduced. The right ventricular size  is moderately enlarged. There is normal pulmonary artery systolic pressure. The estimated right ventricular systolic pressure is 27.1 mmHg.  3. Left atrial size was mild to moderately dilated.  4. Right atrial size was moderately dilated.  5. The mitral valve is normal in structure. Trivial mitral valve regurgitation. No evidence of mitral stenosis.  6. The aortic valve is tricuspid. There is mild calcification of the aortic valve. Aortic valve regurgitation is not visualized. Aortic valve sclerosis/calcification is present, without any evidence of aortic stenosis.  7. Aortic dilatation noted. There is mild dilatation of the ascending aorta, measuring 41 mm.  8. The inferior vena cava is dilated in size with <50% respiratory variability, suggesting right atrial pressure of 15 mmHg. FINDINGS  Left Ventricle: Left ventricular ejection fraction, by estimation, is 50 to 55%. The left ventricle has low normal function. The left ventricle has no regional wall motion abnormalities. Definity  contrast agent was given IV to delineate the left ventricular endocardial borders. The left ventricular internal cavity size was normal in size. There is mild concentric left ventricular hypertrophy. The interventricular septum is flattened in systole, consistent with right ventricular pressure overload. Left ventricular diastolic parameters are indeterminate. Right Ventricle: The right ventricular size is moderately enlarged. No increase in right ventricular wall thickness. Right ventricular systolic function is moderately reduced. There is normal pulmonary artery systolic pressure. The tricuspid regurgitant velocity is 1.74 m/s, and with an assumed right atrial pressure of 15 mmHg, the estimated right ventricular systolic pressure is 27.1 mmHg. Left Atrium: Left atrial size was mild to moderately dilated. Right Atrium: Right atrial size was moderately  dilated. Pericardium: There is no evidence of pericardial effusion. Mitral Valve: The mitral valve is normal in structure. Trivial mitral valve regurgitation. No evidence of mitral valve stenosis. Tricuspid Valve: The tricuspid valve is normal in structure. Tricuspid valve regurgitation is mild . No evidence of tricuspid stenosis. Aortic Valve: The aortic valve is tricuspid. There is mild calcification of the aortic valve. Aortic valve regurgitation is not visualized. Aortic valve sclerosis/calcification is present, without any evidence of aortic stenosis. Pulmonic Valve: The pulmonic valve was normal in structure. Pulmonic valve regurgitation is trivial. No evidence of pulmonic stenosis. Aorta: Aortic dilatation noted. There is mild dilatation of the ascending aorta, measuring 41 mm. Venous: The inferior vena cava is dilated in size with less than 50% respiratory variability, suggesting right atrial pressure of 15 mmHg. IAS/Shunts: No atrial level shunt detected by color flow Doppler.  LEFT VENTRICLE PLAX 2D LVIDd:         5.70 cm   Diastology LVIDs:         4.20 cm   LV e' medial:    8.70 cm/s LV PW:         1.20 cm   LV E/e' medial:  16.1 LV IVS:        1.20 cm   LV e' lateral:   10.60 cm/s LVOT diam:     2.20 cm   LV E/e' lateral: 13.2 LV SV:         58 LV SV Index:   27 LVOT Area:     3.80 cm  RIGHT VENTRICLE            IVC RV S prime:     9.03 cm/s  IVC diam: 2.40 cm TAPSE (M-mode): 1.5 cm LEFT ATRIUM             Index        RIGHT ATRIUM  Index LA diam:        5.20 cm 2.43 cm/m   RA Area:     24.20 cm LA Vol (A2C):   97.5 ml 45.64 ml/m  RA Volume:   72.30 ml  33.84 ml/m LA Vol (A4C):   66.0 ml 30.89 ml/m LA Biplane Vol: 81.4 ml 38.10 ml/m  AORTIC VALVE LVOT Vmax:   94.75 cm/s LVOT Vmean:  60.400 cm/s LVOT VTI:    0.153 m  AORTA Ao Root diam: 3.70 cm Ao Asc diam:  4.10 cm MITRAL VALVE                TRICUSPID VALVE MV Area (PHT): 3.45 cm     TR Peak grad:   12.1 mmHg MV Decel Time: 220 msec      TR Vmax:        174.00 cm/s MV E velocity: 140.33 cm/s MV A velocity: 3.16 cm/s    SHUNTS MV E/A ratio:  44.41        Systemic VTI:  0.15 m                             Systemic Diam: 2.20 cm Toribio Fuel MD Electronically signed by Toribio Fuel MD Signature Date/Time: 04/26/2024/2:37:47 PM    Final    EEG adult Result Date: 04/26/2024 Calvin Arlin KIDD, MD     04/26/2024  1:12 PM Patient Name: Calvin Schultz MRN: [id number] Epilepsy Attending: Arlin Schultz Calvin Referring Physician/Provider: Claudene Fonda BROCKS, NP Date: 04/26/2024 Duration: 25.25 mins Patient history: 73yo M s/p cardiac arrest. EEG to evaluate for seizure Level of alertness: comatose AEDs during EEG study: Propofol  Technical aspects: This EEG study was done with scalp electrodes positioned according to the 10-20 International system of electrode placement. Electrical activity was reviewed with band pass filter of 1-70Hz , sensitivity of 7 uV/mm, display speed of 31mm/sec with a 60Hz  notched filter applied as appropriate. EEG data were recorded continuously and digitally stored.  Video monitoring was available and reviewed as appropriate. Description: Patient was noted to have episodes of sudden eye opening with whole body jerking lasting 8-10seconds every few seconds.  Concomitant EEG showed generalized polyspikes consistent with myoclonic seizures. In between seizures EEG showed generalized background suppression. Hyperventilation and photic stimulation were not performed.   ABNORMALITY - Myoclonic seizure, generalized - Background suppression, generalized IMPRESSION: Patient was noted to have myoclonic seizures every few seconds.  Additionally there was evidence of profound diffuse encephalopathy.  In the setting of cardiac arrest, this EEG pattern is suggestive of anoxic/hypoxic brain injury. Dr. Harold was notified. Arlin Schultz Calvin   DG Chest Port 1 View Result Date: 04/26/2024 CLINICAL DATA:  Ventilator dependence. EXAM: PORTABLE CHEST 1  VIEW COMPARISON:  04/25/2024 FINDINGS: 0528 hours. Endotracheal tube tip is 3.2 cm above the base of the carina. The NG tube passes into the stomach although the distal tip position is not included on the film. The cardio pericardial silhouette is enlarged. Vascular congestion with diffuse interstitial and basilar airspace disease is similar to prior compatible with edema or infection. Small bilateral pleural effusions again noted. Telemetry leads overlie the chest. IMPRESSION: No substantial interval change. Vascular congestion with diffuse interstitial and basilar airspace disease compatible with edema or infection. Electronically Signed   By: Camellia Candle M.D.   On: 04/26/2024 06:05   CT Angio Chest PE W and/or Wo Contrast Result Date: 04/26/2024 EXAM: CTA CHEST PE WITHOUT AND WITH CONTRAST  CT ABDOMEN AND PELVIS WITHOUT AND WITH CONTRAST 04/26/2024 12:54:18 AM TECHNIQUE: CTA of the chest was performed after the administration of intravenous contrast. Multiplanar reformatted images are provided for review. MIP images are provided for review. CT of the abdomen and pelvis was performed with the administration of intravenous contrast. Automated exposure control, iterative reconstruction, and/or weight based adjustment of the mA/kV was utilized to reduce the radiation dose to as low as reasonably achievable. COMPARISON: Chest x-ray dated 04/24/2024. CLINICAL HISTORY: cardiac arrest FINDINGS: CHEST: PULMONARY ARTERIES: Pulmonary arteries are adequately opacified for evaluation. No intraluminal filling defect to suggest pulmonary embolism. Main pulmonary artery is normal in caliber. MEDIASTINUM: Enteric tube tip in the stomach. Coronary artery and aortic atherosclerotic calcification. Cardiomegaly. Reflux of contrast into the IVC and hepatic veins compatible with elevated right heart pressures. Dilated ascending aorta measuring 42 mm. No mediastinal lymphadenopathy. LUNGS AND PLEURA: Bilateral patchy airspace  opacities and consolidation greatest in the lower lobes. Findings are compatible with aspiration / bronchopneumonia. Moderate right and small left pleural effusions. No pneumothorax. SOFT TISSUES AND BONES: IO catheter in the left humerus. Nondisplaced fractures of the bilateral anterior 6th ribs. Diffuse body wall edema. ABDOMEN AND PELVIS: LIVER: No acute abnormality. GALLBLADDER AND BILE DUCTS: Cholelithiasis. No biliary ductal dilatation. SPLEEN: Spleen demonstrates no acute abnormality. PANCREAS: Pancreas demonstrates no acute abnormality. ADRENAL GLANDS: Adrenal glands demonstrate no acute abnormality. KIDNEYS, URETERS AND BLADDER: Left nephrolithiasis measuring up to 15 mm. No stones in the right kidney or ureters. No hydronephrosis. No perinephric or periureteral stranding. Foley catheter and gas in the decompressed bladder. GI AND BOWEL: Enteric tube tip in the stomach. Small stool ball in the rectum. Normal appendix. Stomach and duodenal sweep demonstrate no acute abnormality. There is no bowel obstruction. No abnormal bowel wall thickening or distension. REPRODUCTIVE: Reproductive organs are unremarkable. PERITONEUM AND RETROPERITONEUM: Right femoral CVC tip in the infrarenal IVC at the level of the liver. Small amount of fluid in the pelvis. No ascites or free air. LYMPH NODES: No lymphadenopathy. BONES AND SOFT TISSUES: No acute abnormality of the visualized bones. No focal soft tissue abnormality. IMPRESSION: 1. Negative for pulmonary embolism. 2. Bilateral patchy airspace opacities and consolidation, greatest in the lower lobes, compatible with aspiration/bronchopneumonia, with moderate right and small left pleural effusions. 3. Nondisplaced fractures of the bilateral anterior 6th ribs. 4. Dilated ascending aorta measuring 42 mm.Recommend annual imaging followup by CTA or MRA. This recommendation follows 2010 ACCF/AHA/AATS/ACR/ASA/SCA/SCAI/SIR/STS/SVM Guidelines for the Diagnosis and Management of  Patients with Thoracic Aortic Disease. Circulation. 2010; 121: Z733-z630. Aortic aneurysm NOS (ICD10-I71.9) Electronically signed by: Norman Gatlin MD 04/26/2024 01:06 AM EST RP Workstation: [id number]   CT ABDOMEN PELVIS W CONTRAST Result Date: 04/26/2024 EXAM: CTA CHEST PE WITHOUT AND WITH CONTRAST CT ABDOMEN AND PELVIS WITHOUT AND WITH CONTRAST 04/26/2024 12:54:18 AM TECHNIQUE: CTA of the chest was performed after the administration of intravenous contrast. Multiplanar reformatted images are provided for review. MIP images are provided for review. CT of the abdomen and pelvis was performed with the administration of intravenous contrast. Automated exposure control, iterative reconstruction, and/or weight based adjustment of the mA/kV was utilized to reduce the radiation dose to as low as reasonably achievable. COMPARISON: Chest x-ray dated 04/24/2024. CLINICAL HISTORY: cardiac arrest FINDINGS: CHEST: PULMONARY ARTERIES: Pulmonary arteries are adequately opacified for evaluation. No intraluminal filling defect to suggest pulmonary embolism. Main pulmonary artery is normal in caliber. MEDIASTINUM: Enteric tube tip in the stomach. Coronary artery and aortic atherosclerotic calcification. Cardiomegaly. Reflux of contrast  into the IVC and hepatic veins compatible with elevated right heart pressures. Dilated ascending aorta measuring 42 mm. No mediastinal lymphadenopathy. LUNGS AND PLEURA: Bilateral patchy airspace opacities and consolidation greatest in the lower lobes. Findings are compatible with aspiration / bronchopneumonia. Moderate right and small left pleural effusions. No pneumothorax. SOFT TISSUES AND BONES: IO catheter in the left humerus. Nondisplaced fractures of the bilateral anterior 6th ribs. Diffuse body wall edema. ABDOMEN AND PELVIS: LIVER: No acute abnormality. GALLBLADDER AND BILE DUCTS: Cholelithiasis. No biliary ductal dilatation. SPLEEN: Spleen demonstrates no acute abnormality. PANCREAS:  Pancreas demonstrates no acute abnormality. ADRENAL GLANDS: Adrenal glands demonstrate no acute abnormality. KIDNEYS, URETERS AND BLADDER: Left nephrolithiasis measuring up to 15 mm. No stones in the right kidney or ureters. No hydronephrosis. No perinephric or periureteral stranding. Foley catheter and gas in the decompressed bladder. GI AND BOWEL: Enteric tube tip in the stomach. Small stool ball in the rectum. Normal appendix. Stomach and duodenal sweep demonstrate no acute abnormality. There is no bowel obstruction. No abnormal bowel wall thickening or distension. REPRODUCTIVE: Reproductive organs are unremarkable. PERITONEUM AND RETROPERITONEUM: Right femoral CVC tip in the infrarenal IVC at the level of the liver. Small amount of fluid in the pelvis. No ascites or free air. LYMPH NODES: No lymphadenopathy. BONES AND SOFT TISSUES: No acute abnormality of the visualized bones. No focal soft tissue abnormality. IMPRESSION: 1. Negative for pulmonary embolism. 2. Bilateral patchy airspace opacities and consolidation, greatest in the lower lobes, compatible with aspiration/bronchopneumonia, with moderate right and small left pleural effusions. 3. Nondisplaced fractures of the bilateral anterior 6th ribs. 4. Dilated ascending aorta measuring 42 mm.Recommend annual imaging followup by CTA or MRA. This recommendation follows 2010 ACCF/AHA/AATS/ACR/ASA/SCA/SCAI/SIR/STS/SVM Guidelines for the Diagnosis and Management of Patients with Thoracic Aortic Disease. Circulation. 2010; 121: [id number]. Aortic aneurysm NOS (ICD10-I71.9) Electronically signed by: Norman Gatlin MD 04/26/2024 01:06 AM EST RP Workstation: [id number]   CT Head Wo Contrast Result Date: 04/26/2024 CLINICAL DATA:  Provided history: Mental status change, unknown cause s/p arrest, altered ms EXAM: CT HEAD WITHOUT CONTRAST TECHNIQUE: Contiguous axial images were obtained from the base of the skull through the vertex without intravenous contrast.  RADIATION DOSE REDUCTION: This exam was performed according to the departmental dose-optimization program which includes automated exposure control, adjustment of the mA and/or kV according to patient size and/or use of iterative reconstruction technique. COMPARISON:  Head CT 01/14/2024 FINDINGS: Brain: No intracranial hemorrhage, mass effect, or midline shift. Stable degree of atrophy and chronic small vessel ischemia. No hydrocephalus. The basilar cisterns are patent. No evidence of cerebral edema. No evidence of territorial infarct or acute ischemia. No extra-axial or intracranial fluid collection. Vascular: Atherosclerosis of skullbase vasculature without hyperdense vessel or abnormal calcification. Skull: No fracture or focal lesion. Sinuses/Orbits: Mucous retention cyst in the right maxillary sinus. No acute findings. Other: None. IMPRESSION: 1. No acute intracranial abnormality. 2. Stable atrophy and chronic small vessel ischemia. Electronically Signed   By: Andrea Gasman M.D.   On: 04/26/2024 01:04   DG Abd Portable 1 View Result Date: 04/25/2024 CLINICAL DATA:  Nasogastric tube placement. EXAM: PORTABLE ABDOMEN - 1 VIEW COMPARISON:  07/01/2023 FINDINGS: Tip and side port of the enteric tube below the diaphragm in the stomach. There is gaseous gastric distension. Presumed dialysis catheter from an inferior approach overlies the expected upper IVC. Previous left-sided drainage catheter is no longer seen. Suspected left renal calculi. IMPRESSION: 1. Tip and side port of the enteric tube below the diaphragm in the stomach.  2. Gaseous gastric distension. Electronically Signed   By: Andrea Gasman M.D.   On: 04/25/2024 22:59   DG Chest Port 1 View Result Date: 04/25/2024 CLINICAL DATA:  Unresponsive.  Intubated. EXAM: PORTABLE CHEST 1 VIEW COMPARISON:  01/23/2024 FINDINGS: Endotracheal tube tip is 2.7 cm from the carina. Enteric tube tip below the diaphragm not included in the field of view.  Cardiomegaly is stable. Unchanged mediastinal contours. Aortic atherosclerosis. Bilateral pleural effusions and bibasilar opacities, worsened from prior exam. Vascular congestion. No pneumothorax. IMPRESSION: 1. Endotracheal tube tip 2.7 cm from the carina. 2. Bilateral pleural effusions and bibasilar opacities, worsened from prior exam. 3. Cardiomegaly and vascular congestion. Electronically Signed   By: Andrea Gasman M.D.   On: 04/25/2024 22:58    Medications: Infusions:  amiodarone  30 mg/hr (04/27/24 0800)   epinephrine  2 mcg/min (04/27/24 0800)   heparin  10,000 units/ 20 mL infusion syringe     midazolam  2 mg/hr (04/27/24 0800)   piperacillin -tazobactam (ZOSYN )  IV Stopped (04/27/24 0531)   prismasol  BGK 4/2.5     prismasol  BGK 4/2.5     prismasol  BGK 4/2.5     propofol  (DIPRIVAN ) infusion 55 mcg/kg/min (04/27/24 0944)    Scheduled Medications:  Chlorhexidine  Gluconate Cloth  6 each Topical Daily   collagenase    Topical Daily   heparin   5,000 Units Subcutaneous Q8H   insulin  aspart  0-9 Units Subcutaneous Q4H   levETIRAcetam   500 mg Intravenous Q12H   mouth rinse  15 mL Mouth Rinse Q2H   pantoprazole  (PROTONIX ) IV  40 mg Intravenous QHS   vancomycin  variable dose per unstable renal function (pharmacist dosing)   Does not apply See admin instructions    have reviewed scheduled and prn medications.  Physical Exam: General: Critically ill looking male, intubated, sedated.  Not responsive Heart:RRR, s1s2 nl Lungs: Coarse breath sound bilateral.  e Abdomen:soft,, non-distended Extremities: Bilateral lower extremities pitting edema++ Dialysis Access: Right groin TDC  Calvin Schultz Prasad Ambreen Tufte 04/27/2024,10:07 AM  LOS: 2 days

## 2024-04-27 NOTE — Progress Notes (Addendum)
 "  NAME:  Calvin Schultz, MRN:  [id number], DOB:  Nov 02, 1950, LOS: 2 ADMISSION DATE:  04/25/2024, CONSULTATION DATE:  04/25/2024 REFERRING MD:  , CHIEF COMPLAINT:  Cardiac arrest  History of Present Illness:  73 y/o male with PMH for DMT2, ESRD on HD T/TH/S, HTN, A fib, HLD, Chronic LE wounds, C diff and edema, who was in his usual state of health and with his wife and went to turn off the fireplace and suddenly went blank and wife pulled him out of his wheelchair and started CPR and called EMS who continued CPR.  Initial reports suggest PEA arrest.  He was dialysis yesterday, normally MWF and recently switched to T/TH/S.  Intubated in the field.  In the ED he was found to have K: 2.5, Cr 2.90, BNP 640, Trop 149 and LA 5.3.  He was placed on Epi drip.,  POCUS at bedside, no tamponade, LV function seems intact, enlarged right heart and septum irregularity.  Pertinent  Medical History  DMT2, ESRD on HD T/TH/S, HTN, A fib, HLD, Chronic LE wounds, C diff and edema  Significant Hospital Events: Including procedures, antibiotic start and stop dates in addition to other pertinent events   12/22: Admit to ICU due to [facility]  12/23 Patient intubated, on epi, prop, hypokalemic, no cough/gag  12/24 EEG showing myoclonus at the beginning of EEG, sedation adjusted, myoclonus has improved,   Interim History / Subjective:  Intubated sedated on versed  and prop  Epi 2mcg  Amio gtt   Generalized anasarca   Objective    Blood pressure 105/66, pulse 67, temperature (!) 96.4 F (35.8 C), resp. rate (!) 22, height 5' 8 (1.727 m), weight 106.3 kg, SpO2 100%.    Vent Mode: PRVC FiO2 (%):  [40 %] 40 % Set Rate:  [22 bmp] 22 bmp Vt Set:  [550 mL] 550 mL PEEP:  [5 cmH20] 5 cmH20 Plateau Pressure:  [21 cmH20-30 cmH20] 28 cmH20   Intake/Output Summary (Last 24 hours) at 04/27/2024 9048 Last data filed at 04/27/2024 0800 Gross per 24 hour  Intake 2337.85 ml  Output 390 ml  Net 1947.85 ml   Filed Weights    04/26/24 0324 04/27/24 0457  Weight: 100.7 kg 106.3 kg    Examination: General: acute on chronically ill older adult male, lying in icu bed on vent  HEENT: Normocephalic, PERRLA intact, ETT, OG, Pink MM, missing teeth/poor dentition CV: s1,s2, RRR, no MRG, No JVD  pulm: clear, diminished, no distress on vent, minimal vent settings Abs: bs active, soft  Extremities: anasarca, B/L dressings from knee to feet,  Skin: see images below, multiple skin tears, unstageable wounds lower extremities and sacrum Neuro: Rass -4, heavily sedated, no myoclonic activity, EEG ongoing  GU: foley intact- oliguria to anuria           Resolved problem list  Hypokalemia resolved   Assessment and Plan  Cardiac arrest-witnessed out of hospital suspect related to electrolyte imbalances- hypokalemia Cardiogenic vs Septic shock, more than likely multifactorial- patient has multiple sources of infection- chronic leg wound infection, UTI, and possible aspiration PNA  Leukocytosis> 46.9 to 44.7  Lactic Acidosis- cleared  Concern of Anoxic Brain Injury 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  CT angio of Chest- negative for PE, possible aspiration PNA CT abdomen/pelvis- noted 42 mm dilated descending Aorta CT head negative for acute intracranial abnormality  Echo - LV low normal function- EF 50 to 55%, no regional wall abnormalities, RV enlarged/reduce function  P:  Continue EPI for MAP  goal > 65, hopefully able to wean off today, will depend how patient tolerates CRRT Continue to trend electrolytes via renal function panel Continue EEG for now, will possibly attempt to wean sedation if myoclonus controlled Continue to trend WBC, and fever curve-patient has been hypothermic initially with temperature increase to 100.9 but now trending down, continue to monitor, goal normothermia  Continue Keppra  500mg  BID Will need MRI within 72hr of arrest, should be able to obtain this Friday morning on 12/26  Continue GOC  discussions- Currently limited DNR, wife and daughter understand severity of patient's critical status, if declines recommend transitioning to comfort. HF team following, discussed plan in regards to GOC on 12/23   Acute hypoxic respiratory failure Concern for Aspiration Pneumonia P:  Continue ventilator support and lung protective strategies  Continue LTVV  Wean PEEP and Fio2 requirements to sat goal of >92%  HOB > 30 degrees Plat < 30, currently 29 to 30, continue to trend  Aim for Driving pressures < 15  Intermittent Chest X-ray and ABGS- repeat ABG Obtain and follow cultures-blood and tracheal aspirate VAP and PAD protocols in place  Defer WUA due to myoclonus noted on EEG - continue versed  and propofol    ESRD on HD- T/TH/S Hypokalemia- resolved  P: Continue to trend renal function daily  Continue to monitor and optimize electrolytes daily Continue to monitor urine output Continue strict I/Os Continue Adequate renal perfusion  Avoid nephrotoxic agents  Planning to start CRRT today, nephro following appreciate assistance   UTI  UA- many bacteria, leukocytes  P: Continue zosyn  and vanc  Continue to trend cultures, UC pending     Labs   CBC: Recent Labs  Lab 04/25/24 2217 04/25/24 2218 04/26/24 0015 04/26/24 0438 04/26/24 1831 04/27/24 0444  WBC 34.3*  --   --  46.9*  --  44.7*  HGB 8.6* 10.5* 10.9* 8.8* 10.2* 8.3*  HCT 30.5* 31.0* 32.0* 29.1* 30.0* 26.3*  MCV 97.4  --   --  92.1  --  87.7  PLT 272  --   --  201  --  238    Basic Metabolic Panel: Recent Labs  Lab 04/25/24 2217 04/25/24 2218 04/25/24 2222 04/26/24 0015 04/26/24 0438 04/26/24 1201 04/26/24 1831 04/27/24 0444  NA 140 138  --  137 138 137 134* 135  K 2.7* 2.5*  --  2.2* 2.4* 3.7 4.3 5.0  CL 97* 97*  --   --  95* 95*  --  94*  CO2 22  --   --   --  21* 20*  --  21*  GLUCOSE 185* 182*  --   --  195* 149*  --  150*  BUN 18 20  --   --  20 22  --  27*  CREATININE 2.83* 2.90*  --   --   3.08* 3.12*  --  3.32*  CALCIUM  9.2  --   --   --  8.9 9.0  --  9.0  MG  --   --  1.9  --  1.7 2.0  --   --   PHOS  --   --   --   --  3.4  --   --   --    GFR: Estimated Creatinine Clearance: 24.1 mL/min (A) (by C-G formula based on SCr of 3.32 mg/dL (H)). Recent Labs  Lab 04/25/24 2217 04/25/24 2218 04/26/24 0438 04/26/24 0518 04/26/24 0945 04/26/24 1230 04/26/24 1827 04/27/24 0444  WBC 34.3*  --  46.9*  --   --   --   --  44.7*  LATICACIDVEN  --    < >  --  4.2* 3.8* 3.8* 1.7  --    < > = values in this interval not displayed.    Liver Function Tests: Recent Labs  Lab 04/25/24 2217 04/26/24 0438  AST 20 21  ALT 9 14  ALKPHOS 145* 135*  BILITOT 0.3 0.5  PROT 5.5* 5.2*  ALBUMIN  2.6* 2.6*   No results for input(s): LIPASE, AMYLASE in the last 168 hours. No results for input(s): AMMONIA in the last 168 hours.  ABG    Component Value Date/Time   PHART 7.366 04/26/2024 1831   PCO2ART 42.2 04/26/2024 1831   PO2ART 120 (H) 04/26/2024 1831   HCO3 23.9 04/26/2024 1831   TCO2 25 04/26/2024 1831   ACIDBASEDEF 1.0 04/26/2024 1831   O2SAT 98 04/26/2024 1831     Coagulation Profile: No results for input(s): INR, PROTIME in the last 168 hours.  Cardiac Enzymes: No results for input(s): CKTOTAL, CKMB, CKMBINDEX, TROPONINI in the last 168 hours.  HbA1C: Hgb A1c MFr Bld  Date/Time Value Ref Range Status  04/27/2024 04:41 AM 5.3 4.8 - 5.6 % Final    Comment:    (NOTE) Diagnosis of Diabetes The following HbA1c ranges recommended by the American Diabetes Association (ADA) may be used as an aid in the diagnosis of diabetes mellitus.  Hemoglobin             Suggested A1C NGSP%              Diagnosis  <5.7                   Non Diabetic  5.7-6.4                Pre-Diabetic  >6.4                   Diabetic  <7.0                   Glycemic control for                       adults with diabetes.    01/15/2024 08:58 AM 5.6 4.8 - 5.6 % Final     Comment:    (NOTE) Diagnosis of Diabetes The following HbA1c ranges recommended by the American Diabetes Association (ADA) may be used as an aid in the diagnosis of diabetes mellitus.  Hemoglobin             Suggested A1C NGSP%              Diagnosis  <5.7                   Non Diabetic  5.7-6.4                Pre-Diabetic  >6.4                   Diabetic  <7.0                   Glycemic control for                       adults with diabetes.      CBG: Recent Labs  Lab 04/26/24 1534 04/26/24 2015 04/26/24 2323 04/27/24 0344 04/27/24 0810  GLUCAP 107* 105* 105* 107* 114*  Review of Systems:   Intubated and sedated  Past Medical History:  He,  has a past medical history of Arthritis, Atrial fibrillation (HCC), CKD (chronic kidney disease) stage 3, GFR 30-59 ml/min (HCC), Diabetes mellitus without complication (HCC), Dysrhythmia, Hyperlipidemia, and Hypertension.   Surgical History:   Past Surgical History:  Procedure Laterality Date   AV FISTULA PLACEMENT Left 10/09/2023   Procedure: ARTERIOVENOUS (AV) FISTULA CREATION, FIRST STAGE BASILIC;  Surgeon: Pearline Norman RAMAN, MD;  Location: [facility] OR;  Service: Vascular;  Laterality: Left;  LEFT ARM AVF VERSUS AVG   CYSTOSCOPY W/ URETERAL STENT PLACEMENT Right 08/05/2023   Procedure: CYSTOSCOPY, WITH RETROGRADE PYELOGRAM AND URETERAL STENT INSERTION;  Surgeon: Roseann Adine PARAS., MD;  Location: [facility] OR;  Service: Urology;  Laterality: Right;   CYSTOSCOPY/URETEROSCOPY/HOLMIUM LASER/STENT PLACEMENT Left 07/24/2023   Procedure: CYSTOSCOPY/URETEROSCOPY/HOLMIUM LASER/ STENT PLACEMENT/REMOVAL OF NEPHROSTOMY TUBE;  Surgeon: Nieves Cough, MD;  Location: [facility] ORS;  Service: Urology;  Laterality: Left;  90 MINUTE CASE   CYSTOSCOPY/URETEROSCOPY/HOLMIUM LASER/STENT PLACEMENT Bilateral 08/18/2023   Procedure: CYSTOSCOPY/URETEROSCOPY/HOLMIUM LASER/STENT PLACEMENT;  Surgeon: Nieves Cough, MD;  Location: [facility] OR;  Service: Urology;   Laterality: Bilateral;   DIALYSIS/PERMA CATHETER INSERTION N/A 01/20/2024   Procedure: DIALYSIS/PERMA CATHETER INSERTION;  Surgeon: Lanis Fonda BRAVO, MD;  Location: [facility] INVASIVE CV LAB;  Service: Cardiovascular;  Laterality: N/A;   INSERTION OF DIALYSIS CATHETER Right 10/09/2023   Procedure: INSERTION OF DIALYSIS CATHETER, USING PALINDROME CATHETER KIT 19CM;  Surgeon: Pearline Norman RAMAN, MD;  Location: [facility] OR;  Service: Vascular;  Laterality: Right;  TUNNELED DIALYSIS CATHETER   IR NEPHROSTOMY PLACEMENT LEFT  06/03/2023   KIDNEY SURGERY     TRANSESOPHAGEAL ECHOCARDIOGRAM (CATH LAB) N/A 01/19/2024   Procedure: TRANSESOPHAGEAL ECHOCARDIOGRAM;  Surgeon: Shlomo Wilbert SAUNDERS, MD;  Location: [facility] INVASIVE CV LAB;  Service: Cardiovascular;  Laterality: N/A;   TUNNELLED CATHETER EXCHANGE N/A 11/20/2023   Procedure: TUNNELLED CATHETER EXCHANGE;  Surgeon: Sheree Penne Bruckner, MD;  Location: [facility] LAB;  Service: Cardiovascular;  Laterality: N/A;     Social History:   reports that he has never smoked. He has never used smokeless tobacco. He reports that he does not drink alcohol  and does not use drugs.   Family History:  His family history includes Dementia in his mother.   Allergies Allergies[1]   Home Medications  Prior to Admission medications  Medication Sig Start Date End Date Taking? Authorizing Provider  acetaminophen  (TYLENOL ) 325 MG tablet Take 2 tablets (650 mg total) by mouth every 4 (four) hours as needed for mild pain (pain score 1-3) or fever. 08/05/23   Angiulli, Toribio PARAS, PA-C  docusate sodium  (COLACE) 100 MG capsule Take 1 capsule (100 mg total) by mouth 2 (two) times daily as needed for mild constipation. Patient not taking: Reported on 03/01/2024 01/23/24   Tobie Yetta HERO, MD  gabapentin  (NEURONTIN ) 100 MG capsule Take 100 mg by mouth at bedtime. 12/29/23   [provider]  iron  polysaccharides (NIFEREX) 150 MG capsule Take 1 capsule (150 mg total) by mouth daily. 07/11/23   Vann,  Jessica U, DO  LANTUS SOLOSTAR 100 UNIT/ML Solostar Pen Inject 3-4 Units into the skin daily as needed (for hyperglycemia). 11/02/23   [provider]  midodrine  (PROAMATINE ) 10 MG tablet Take 1 tablet (10 mg total) by mouth 3 (three) times daily with meals. 01/23/24   Patel, Pranav M, MD  multivitamin (RENA-VIT) TABS tablet Take 1 tablet by mouth at bedtime. 08/05/23   Angiulli, Toribio PARAS, PA-C  Nutritional Supplements (,  FEEDING SUPPLEMENT, PROSOURCE PLUS) liquid Take 30 mLs by mouth 2 (two) times daily between meals. Patient not taking: Reported on 03/01/2024 01/23/24   Tobie Yetta HERO, MD  Nutritional Supplements (FEEDING SUPPLEMENT, NEPRO CARB STEADY,) LIQD Take 237 mLs by mouth 3 (three) times daily between meals. Patient not taking: Reported on 03/01/2024 01/23/24   Tobie Yetta HERO, MD  Nystatin (GERHARDT'S BUTT CREAM) CREA Apply 1 Application topically 2 (two) times daily. 08/20/23   Angiulli, Toribio PARAS, PA-C  oxyCODONE -acetaminophen  (PERCOCET) 10-325 MG tablet Take 1 tablet by mouth every 6 (six) hours as needed for pain. Patient not taking: Reported on 03/01/2024 01/23/24 01/22/25  Patel, Pranav M, MD  polyethylene glycol (MIRALAX  / GLYCOLAX ) 17 g packet Take 17 g by mouth daily. Patient not taking: Reported on 03/01/2024 01/23/24   Tobie Yetta HERO, MD  sevelamer  carbonate (RENVELA ) 800 MG tablet Take 2 tablets (1,600 mg total) by mouth 3 (three) times daily with meals. 01/23/24   Patel, Pranav M, MD  silver  sulfADIAZINE  (SILVADENE ) 1 % cream Apply 1 Application topically daily. Apply to legs 11/21/23 07/10/24  [provider]  tamsulosin  (FLOMAX ) 0.4 MG CAPS capsule Take 1 capsule (0.4 mg total) by mouth daily after supper. 10/16/23   Tobie Yetta HERO, MD  tiZANidine  (ZANAFLEX ) 4 MG tablet Take 1 tablet (4 mg total) by mouth 2 (two) times daily as needed for muscle spasms. 08/05/23   Angiulli, Toribio PARAS, PA-C  traZODone  (DESYREL ) 50 MG tablet Take 1 tablet (50 mg total) by mouth at bedtime as  needed for sleep. 10/16/23   Tobie Yetta HERO, MD     Critical care time: 55 mins     [facility]   [facility] 04/27/2024, 9:51 AM  Please see [email or web address] for pager details.  From 7A-7P if no response, please call [phone]. After hours, please call ELink [phone].          [1] No Known Allergies  "

## 2024-04-27 NOTE — Progress Notes (Signed)
 vLTM maintenance  All impedances below 10k  No skin breakdown noted at T4 P8 F8 A2

## 2024-04-27 NOTE — Procedures (Signed)
 Patient Name: Al Bracewell  MRN: [id number]  Epilepsy Attending: Arlin MALVA Krebs  Referring Physician/Provider:  Dr Valinda Novas  Duration: 04/26/2024 1202 to 04/27/2024 1202   Patient history: 73yo M s/p cardiac arrest. EEG to evaluate for seizure   Level of alertness: comatose   AEDs during EEG study: Propofol    Technical aspects: This EEG study was done with scalp electrodes positioned according to the 10-20 International system of electrode placement. Electrical activity was reviewed with band pass filter of 1-70Hz , sensitivity of 7 uV/mm, display speed of 44mm/sec with a 60Hz  notched filter applied as appropriate. EEG data were recorded continuously and digitally stored.  Video monitoring was available and reviewed as appropriate.   Description: At the beginning of the study, patient was noted to have episodes of sudden eye opening with whole body jerking lasting 8-10seconds every few seconds. Concomitant EEG showed generalized polyspikes consistent with myoclonic seizures. In between seizures EEG showed generalized background suppression. Gradually as medications were adjusted, seizures abated and eeg showed burst suppression with bursts of generalized 4-5hz  theta slowing lasting 0.5 to 1 second with interburst interval of 3-5 seconds. Hyperventilation and photic stimulation were not performed.      ABNORMALITY - Myoclonic seizure, generalized - Burst suppression, generalized   IMPRESSION: At the beginning of the study, patient was noted to have myoclonic seizures every few seconds. Gradually as medications were adjusted, seizures abated. Subsequently, there was profound diffuse encephalopathy.     Jamarie Mussa O Owais Pruett

## 2024-04-27 NOTE — Progress Notes (Signed)
 Brief Nutrition Support Note  Nephrology consulted RD as pt is now on CRRT. Full assessment of pt previously done 12/23. Pt's calorie and protein needs increased with the initiation of CRRT. Recommend initiation of enteral nutrition within next 24-48 hr if within GOC to help meet pt's nutrition needs.  Please consult when ready to initiate nutrition interventions. Outlined recommendations below:   INTERVENTION:    Tube Feeding Recommendations if initiated: initiate at 20 ml/h and increase 10 ml q8h to monitor tolerance Vital 1.5 at 50 ml/hr Pro-Source TF20 60 mL BID TF provides 1960 kcals, 121 g of protein and 912 mL of free water    Add Renal MVI daily      Josette Glance, MS, RDN, LDN Clinical Dietitian I Please reach out via secure chat

## 2024-04-27 NOTE — Progress Notes (Signed)
 Pt receives out-pt HD at [facility] [place] on MWF 11:25 am chair time. Will assist as needed.   Randine Mungo Dialysis Navigator [phone]

## 2024-04-28 ENCOUNTER — Inpatient Hospital Stay (HOSPITAL_COMMUNITY)

## 2024-04-28 DIAGNOSIS — R569 Unspecified convulsions: Secondary | ICD-10-CM | POA: Diagnosis not present

## 2024-04-28 DIAGNOSIS — N390 Urinary tract infection, site not specified: Secondary | ICD-10-CM

## 2024-04-28 DIAGNOSIS — G931 Anoxic brain damage, not elsewhere classified: Secondary | ICD-10-CM

## 2024-04-28 LAB — RENAL FUNCTION PANEL
Albumin: 2.6 g/dL — ABNORMAL LOW (ref 3.5–5.0)
Albumin: 2.5 g/dL — ABNORMAL LOW (ref 3.5–5.0)
Anion gap: 16 — ABNORMAL HIGH (ref 5–15)
Anion gap: 13 (ref 5–15)
BUN: 21 mg/dL (ref 8–23)
BUN: 16 mg/dL (ref 8–23)
CO2: 22 mmol/L (ref 22–32)
CO2: 23 mmol/L (ref 22–32)
Calcium: 8.6 mg/dL — ABNORMAL LOW (ref 8.9–10.3)
Calcium: 8.3 mg/dL — ABNORMAL LOW (ref 8.9–10.3)
Chloride: 98 mmol/L (ref 98–111)
Chloride: 98 mmol/L (ref 98–111)
Creatinine, Ser: 2.28 mg/dL — ABNORMAL HIGH (ref 0.61–1.24)
Creatinine, Ser: 1.73 mg/dL — ABNORMAL HIGH (ref 0.61–1.24)
GFR, Estimated: 30 mL/min — ABNORMAL LOW
GFR, Estimated: 42 mL/min — ABNORMAL LOW
Glucose, Bld: 90 mg/dL (ref 70–99)
Glucose, Bld: 85 mg/dL (ref 70–99)
Phosphorus: 3.4 mg/dL (ref 2.5–4.6)
Phosphorus: 2.6 mg/dL (ref 2.5–4.6)
Potassium: 4.6 mmol/L (ref 3.5–5.1)
Potassium: 4.5 mmol/L (ref 3.5–5.1)
Sodium: 135 mmol/L (ref 135–145)
Sodium: 135 mmol/L (ref 135–145)

## 2024-04-28 LAB — CBC
HCT: 27.2 % — ABNORMAL LOW (ref 39.0–52.0)
Hemoglobin: 8.5 g/dL — ABNORMAL LOW (ref 13.0–17.0)
MCH: 27.7 pg (ref 26.0–34.0)
MCHC: 31.3 g/dL (ref 30.0–36.0)
MCV: 88.6 fL (ref 80.0–100.0)
Platelets: 259 K/uL (ref 150–400)
RBC: 3.07 MIL/uL — ABNORMAL LOW (ref 4.22–5.81)
RDW: 16.4 % — ABNORMAL HIGH (ref 11.5–15.5)
WBC: 37.7 K/uL — ABNORMAL HIGH (ref 4.0–10.5)
nRBC: 0.1 % (ref 0.0–0.2)

## 2024-04-28 LAB — GLUCOSE, CAPILLARY
Glucose-Capillary: 75 mg/dL (ref 70–99)
Glucose-Capillary: 76 mg/dL (ref 70–99)
Glucose-Capillary: 84 mg/dL (ref 70–99)
Glucose-Capillary: 90 mg/dL (ref 70–99)
Glucose-Capillary: 91 mg/dL (ref 70–99)
Glucose-Capillary: 92 mg/dL (ref 70–99)

## 2024-04-28 LAB — APTT: aPTT: 44 s — ABNORMAL HIGH (ref 24–36)

## 2024-04-28 LAB — MAGNESIUM: Magnesium: 2 mg/dL (ref 1.7–2.4)

## 2024-04-28 MED ORDER — PIPERACILLIN-TAZOBACTAM 3.375 G IVPB 30 MIN
3.3750 g | Freq: Four times a day (QID) | INTRAVENOUS | Status: DC
  Administered 2024-04-28 – 2024-04-29 (×4): 3.375 g via INTRAVENOUS
  Filled 2024-04-28 (×4): qty 50

## 2024-04-28 MED ORDER — DEXTROSE 50 % IV SOLN
1.0000 | Freq: Once | INTRAVENOUS | Status: AC
  Administered 2024-04-28: 50 mL via INTRAVENOUS
  Filled 2024-04-28: qty 50

## 2024-04-28 MED ORDER — LEVETIRACETAM (KEPPRA) 500 MG/5 ML ADULT IV PUSH
750.0000 mg | Freq: Two times a day (BID) | INTRAVENOUS | Status: DC
  Administered 2024-04-28: 750 mg via INTRAVENOUS
  Filled 2024-04-28: qty 10

## 2024-04-28 NOTE — Procedures (Addendum)
 Patient Name: Calvin Schultz  MRN: [id number]  Epilepsy Attending: Arlin MALVA Krebs  Referring Physician/Provider:  Dr Valinda Novas  Duration: 04/27/2024 1202 to 04/28/2024 0122   Patient history: 73yo M s/p cardiac arrest. EEG to evaluate for seizure   Level of alertness: comatose   AEDs during EEG study: Propofol , LEV   Technical aspects: This EEG study was done with scalp electrodes positioned according to the 10-20 International system of electrode placement. Electrical activity was reviewed with band pass filter of 1-70Hz , sensitivity of 7 uV/mm, display speed of 53mm/sec with a 60Hz  notched filter applied as appropriate. EEG data were recorded continuously and digitally stored.  Video monitoring was available and reviewed as appropriate.   Description: At the beginning of the study, EEG showed burst suppression with bursts of generalized 4-5hz  theta slowing lasting 0.5 to 1 second with interburst interval of 3-5 seconds. Gradually eeg worsened and showed near continuous background suppression. Hyperventilation and photic stimulation were not performed.      ABNORMALITY - Background suppression, generalized   IMPRESSION: This study was suggestive of profound diffuse encephalopathy.  No seizures were noted.   Calvin Schultz

## 2024-04-28 NOTE — Progress Notes (Signed)
 [facility] NEPHROLOGY PROGRESS NOTE  Assessment/ Plan: Pt is a 73 y.o. yo male  with past medical history significant for hypertension, dyslipidemia, diabetes, A-fib, ESRD on HD who was admitted with PEA cardiac arrest, seen as a consult for the management of ESRD.  OP HD orders:  Dialyzes at [facility], MWF, EDW: 97.4 kg, 4 hr, 2k 2.5 ca, last OP HD on 12/21, R groin TDC, Mircera 225 mcg on 12/21 Sod thiosulfate at HD.  # Cardiac arrest, PEA thought to be due to hypokalemia.  However EKG with normal QT interval, seen by cardiologist.  CT angio with no PE.  Echo with EF of 50 to 55% EF.   # ESRD: MWF at [facility], last HD on 12/21 due to holiday schedule.  Outpatient potassium level was 5. Currently on epinephrine  with soft BP.   Continue CRRT at current prescription. Exam consistent with some peripheral  All 4K bath, limited dose of heparin , UF goal 50-150 cc/per hour as tolerated by BP.   # Cardiogenic shock currently on epinephrine .  Monitor BP.   # Anemia of ESRD: Hemoglobin around 8.6-10.9, received Mircera on 12/21.  Monitor hemoglobin and transfuse as needed.   # Hypokalemia: Last outpatient potassium level was around 5.  Received potassium repletion.  CRRT with 4K bath and monitor lab.   # CKD-MBD: Monitor calcium  phosphorus.  # Anoxic brain injury: EEG with myoclonic seizure and profound diffuse encephalopathy consistent with anoxic brain injury.  Neurology team is following.   Subjective: Seen and examined.  Tolerating CRRT well.  Patient's wife and daughter at the bedside.  Discussed with ICU nurse. Objective Vital signs in last 24 hours: Vitals:   04/28/24 0915 04/28/24 0930 04/28/24 0945 04/28/24 1000  BP:      Pulse: 66 (!) 55 71 67  Resp: 20 20 19 20   Temp: 98.1 F (36.7 C) 98.2 F (36.8 C) 98.4 F (36.9 C) 98.4 F (36.9 C)  TempSrc:      SpO2: 100% 100% 100% 100%  Weight:      Height:       Weight change: -4.4 kg  Intake/Output  Summary (Last 24 hours) at 04/28/2024 1019 Last data filed at 04/28/2024 1000 Gross per 24 hour  Intake 1747.75 ml  Output 2826.2 ml  Net -1078.45 ml       Labs: RENAL PANEL Recent Labs  Lab 04/25/24 2217 04/25/24 2218 04/25/24 2222 04/26/24 0015 04/26/24 0438 04/26/24 1201 04/26/24 1831 04/27/24 0444 04/27/24 1010 04/27/24 1730 04/28/24 0415  NA 140   < >  --    < > 138 137 134* 135 132* 134* 135  K 2.7*   < >  --    < > 2.4* 3.7 4.3 5.0 4.5 4.5 4.6  CL 97*   < >  --   --  95* 95*  --  94*  --  95* 98  CO2 22  --   --   --  21* 20*  --  21*  --  22 22  GLUCOSE 185*   < >  --   --  195* 149*  --  150*  --  104* 90  BUN 18   < >  --   --  20 22  --  27*  --  26* 21  CREATININE 2.83*   < >  --   --  3.08* 3.12*  --  3.32*  --  2.95* 2.28*  CALCIUM  9.2  --   --   --  8.9 9.0  --  9.0  --  8.8* 8.6*  MG  --   --  1.9  --  1.7 2.0  --   --   --   --  2.0  PHOS  --   --   --   --  3.4  --   --   --   --  4.2 3.4  ALBUMIN  2.6*  --   --   --  2.6*  --   --   --   --  2.5* 2.6*   < > = values in this interval not displayed.    Liver Function Tests: Recent Labs  Lab 04/25/24 2217 04/26/24 0438 04/27/24 1730 04/28/24 0415  AST 20 21  --   --   ALT 9 14  --   --   ALKPHOS 145* 135*  --   --   BILITOT 0.3 0.5  --   --   PROT 5.5* 5.2*  --   --   ALBUMIN  2.6* 2.6* 2.5* 2.6*   No results for input(s): LIPASE, AMYLASE in the last 168 hours. No results for input(s): AMMONIA in the last 168 hours. CBC: Recent Labs    06/03/23 0647 06/04/23 0503 06/04/23 1126 06/05/23 0608 07/03/23 1514 07/04/23 0140 10/02/23 1145 10/03/23 0336 10/05/23 0432 10/06/23 0507 10/11/23 9287 10/12/23 0547 01/18/24 0454 01/19/24 0426 04/26/24 0438 04/26/24 1831 04/27/24 0444 04/27/24 1010 04/28/24 0415  HGB 10.2*   < >  --    < >  --    < > 7.6*   < > 8.1*   < > 8.5*   < > 7.8*   < > 8.8* 10.2* 8.3* 8.8* 8.5*  MCV 85.5   < >  --    < >  --    < >  --    < > 86.5   < > 90.0    < > 93.3   < > 92.1  --  87.7  --  88.6  VITAMINB12  --   --   --   --   --   --   --   --  635  --   --   --  1,000*  --   --   --   --   --   --   FOLATE  --   --   --   --   --   --  21.8  --   --   --   --   --  12.9  --   --   --   --   --   --   FERRITIN  --   --  32  --  88  --  76  --   --   --  115  --  1,284*  --   --   --   --   --   --   TIBC 384  --   --   --  179*  --  249*  --   --   --  224*  --  192*  --   --   --   --   --   --   IRON  45  --   --   --  35*  --  50  --   --   --  37*  --  52  --   --   --   --   --   --  RETICCTPCT  --   --   --   --   --   --  1.5  --   --   --   --   --   --   --   --   --   --   --   --    < > = values in this interval not displayed.    Cardiac Enzymes: No results for input(s): CKTOTAL, CKMB, CKMBINDEX, TROPONINI in the last 168 hours. CBG: Recent Labs  Lab 04/27/24 1607 04/27/24 2016 04/28/24 0052 04/28/24 0416 04/28/24 0748  GLUCAP 101* 92 92 91 75    Iron  Studies: No results for input(s): IRON , TIBC, TRANSFERRIN, FERRITIN in the last 72 hours. Studies/Results: MR BRAIN WO CONTRAST Result Date: 04/28/2024 EXAM: MRI BRAIN WITHOUT CONTRAST 04/28/2024 02:55:00 AM TECHNIQUE: Multiplanar multisequence MRI of the head/brain was performed without the administration of intravenous contrast. COMPARISON: CT head 04/26/2024 CLINICAL HISTORY: Anoxic brain damage FINDINGS: BRAIN AND VENTRICLES: Diffuse supratentorial and cerebellar cortical restricted diffusion, compatible with hypoxic ischemic injury. Edema and sulcal effacement. Basal cisterns are patent. No midline shift. No intracranial hemorrhage. No mass. No hydrocephalus. Patchy T2 hyperintensities in the white matter are compatible with chornic microvascular ischemic change. Normal flow voids. ORBITS: No acute abnormality. SINUSES AND MASTOIDS: No acute abnormality. BONES AND SOFT TISSUES: Normal marrow signal. No acute soft tissue abnormality. IMPRESSION: 1. Findings  compatible with diffuse acute hypoxic-ischemic injury, as detailed above. Electronically signed by: Gilmore Molt 04/28/2024 03:35 AM EST RP Workstation: [id number]   Overnight EEG with video Result Date: 04/27/2024 [name], MD     04/28/2024  6:19 AM Patient Name: Calvin Schultz MRN: [id number] Epilepsy Attending: Arlin KIDD Shelton Referring Physician/Provider:  Dr Valinda Novas Duration: 04/26/2024 1202 to 04/27/2024 1202  Patient history: 73yo M s/p cardiac arrest. EEG to evaluate for seizure  Level of alertness: comatose  AEDs during EEG study: Propofol   Technical aspects: This EEG study was done with scalp electrodes positioned according to the 10-20 International system of electrode placement. Electrical activity was reviewed with band pass filter of 1-70Hz , sensitivity of 7 uV/mm, display speed of 38mm/sec with a 60Hz  notched filter applied as appropriate. EEG data were recorded continuously and digitally stored.  Video monitoring was available and reviewed as appropriate.  Description: At the beginning of the study, patient was noted to have episodes of sudden eye opening with whole body jerking lasting 8-10seconds every few seconds. Concomitant EEG showed generalized polyspikes consistent with myoclonic seizures. In between seizures EEG showed generalized background suppression. Gradually as medications were adjusted, seizures abated and eeg showed burst suppression with bursts of generalized 4-5hz  theta slowing lasting 0.5 to 1 second with interburst interval of 3-5 seconds. Hyperventilation and photic stimulation were not performed.    ABNORMALITY - Myoclonic seizure, generalized - Burst suppression, generalized  IMPRESSION: At the beginning of the study, patient was noted to have myoclonic seizures every few seconds. Gradually as medications were adjusted, seizures abated. Subsequently, there was profound diffuse encephalopathy.   Arlin KIDD Shelton    ECHOCARDIOGRAM COMPLETE Result Date:  04/26/2024    ECHOCARDIOGRAM REPORT   Patient Name:   Calvin Schultz Date of Exam: 04/26/2024 Medical Rec #:  [id number]   Height:       68.0 in Accession #:    [id number]  Weight:       222.0 lb Date of Birth:  Apr 05, 1951  BSA:          2.136 m  Patient Age:    73 years    BP:           115/66 mmHg Patient Gender: M           HR:           83 bpm. Exam Location:  Inpatient Procedure: 2D Echo, Cardiac Doppler, Color Doppler and Intracardiac            Opacification Agent (Both Spectral and Color Flow Doppler were            utilized during procedure). Indications:    Cardiac Arrest  History:        Patient has prior history of Echocardiogram examinations, most                 recent 01/17/2024. Arrythmias:Atrial Fibrillation; Risk                 Factors:Hypertension and Diabetes.  Sonographer:    Philomena Daring Referring Phys: [id number] STEPHANIE M REESE IMPRESSIONS  1. Left ventricular ejection fraction, by estimation, is 50 to 55%. The left ventricle has low normal function. The left ventricle has no regional wall motion abnormalities. There is mild concentric left ventricular hypertrophy. Left ventricular diastolic parameters are indeterminate. There is the interventricular septum is flattened in systole, consistent with right ventricular pressure overload.  2. Right ventricular systolic function is moderately reduced. The right ventricular size is moderately enlarged. There is normal pulmonary artery systolic pressure. The estimated right ventricular systolic pressure is 27.1 mmHg.  3. Left atrial size was mild to moderately dilated.  4. Right atrial size was moderately dilated.  5. The mitral valve is normal in structure. Trivial mitral valve regurgitation. No evidence of mitral stenosis.  6. The aortic valve is tricuspid. There is mild calcification of the aortic valve. Aortic valve regurgitation is not visualized. Aortic valve sclerosis/calcification is present, without any evidence of aortic stenosis.  7. Aortic  dilatation noted. There is mild dilatation of the ascending aorta, measuring 41 mm.  8. The inferior vena cava is dilated in size with <50% respiratory variability, suggesting right atrial pressure of 15 mmHg. FINDINGS  Left Ventricle: Left ventricular ejection fraction, by estimation, is 50 to 55%. The left ventricle has low normal function. The left ventricle has no regional wall motion abnormalities. Definity  contrast agent was given IV to delineate the left ventricular endocardial borders. The left ventricular internal cavity size was normal in size. There is mild concentric left ventricular hypertrophy. The interventricular septum is flattened in systole, consistent with right ventricular pressure overload. Left ventricular diastolic parameters are indeterminate. Right Ventricle: The right ventricular size is moderately enlarged. No increase in right ventricular wall thickness. Right ventricular systolic function is moderately reduced. There is normal pulmonary artery systolic pressure. The tricuspid regurgitant velocity is 1.74 m/s, and with an assumed right atrial pressure of 15 mmHg, the estimated right ventricular systolic pressure is 27.1 mmHg. Left Atrium: Left atrial size was mild to moderately dilated. Right Atrium: Right atrial size was moderately dilated. Pericardium: There is no evidence of pericardial effusion. Mitral Valve: The mitral valve is normal in structure. Trivial mitral valve regurgitation. No evidence of mitral valve stenosis. Tricuspid Valve: The tricuspid valve is normal in structure. Tricuspid valve regurgitation is mild . No evidence of tricuspid stenosis. Aortic Valve: The aortic valve is tricuspid. There is mild calcification of the aortic valve. Aortic valve regurgitation is not visualized. Aortic valve sclerosis/calcification is present, without any evidence of aortic stenosis. Pulmonic  Valve: The pulmonic valve was normal in structure. Pulmonic valve regurgitation is trivial. No  evidence of pulmonic stenosis. Aorta: Aortic dilatation noted. There is mild dilatation of the ascending aorta, measuring 41 mm. Venous: The inferior vena cava is dilated in size with less than 50% respiratory variability, suggesting right atrial pressure of 15 mmHg. IAS/Shunts: No atrial level shunt detected by color flow Doppler.  LEFT VENTRICLE PLAX 2D LVIDd:         5.70 cm   Diastology LVIDs:         4.20 cm   LV e' medial:    8.70 cm/s LV PW:         1.20 cm   LV E/e' medial:  16.1 LV IVS:        1.20 cm   LV e' lateral:   10.60 cm/s LVOT diam:     2.20 cm   LV E/e' lateral: 13.2 LV SV:         58 LV SV Index:   27 LVOT Area:     3.80 cm  RIGHT VENTRICLE            IVC RV S prime:     9.03 cm/s  IVC diam: 2.40 cm TAPSE (M-mode): 1.5 cm LEFT ATRIUM             Index        RIGHT ATRIUM           Index LA diam:        5.20 cm 2.43 cm/m   RA Area:     24.20 cm LA Vol (A2C):   97.5 ml 45.64 ml/m  RA Volume:   72.30 ml  33.84 ml/m LA Vol (A4C):   66.0 ml 30.89 ml/m LA Biplane Vol: 81.4 ml 38.10 ml/m  AORTIC VALVE LVOT Vmax:   94.75 cm/s LVOT Vmean:  60.400 cm/s LVOT VTI:    0.153 m  AORTA Ao Root diam: 3.70 cm Ao Asc diam:  4.10 cm MITRAL VALVE                TRICUSPID VALVE MV Area (PHT): 3.45 cm     TR Peak grad:   12.1 mmHg MV Decel Time: 220 msec     TR Vmax:        174.00 cm/s MV E velocity: 140.33 cm/s MV A velocity: 3.16 cm/s    SHUNTS MV E/A ratio:  44.41        Systemic VTI:  0.15 m                             Systemic Diam: 2.20 cm Toribio Fuel MD Electronically signed by Toribio Fuel MD Signature Date/Time: 04/26/2024/2:37:47 PM    Final    EEG adult Result Date: 04/26/2024 [name], MD     04/26/2024  1:12 PM Patient Name: Calvin Schultz MRN: [id number] Epilepsy Attending: Arlin KIDD Shelton Referring Physician/Provider: Claudene Fonda BROCKS, NP Date: 04/26/2024 Duration: 25.25 mins Patient history: 73yo M s/p cardiac arrest. EEG to evaluate for seizure Level of alertness: comatose  AEDs during EEG study: Propofol  Technical aspects: This EEG study was done with scalp electrodes positioned according to the 10-20 International system of electrode placement. Electrical activity was reviewed with band pass filter of 1-70Hz , sensitivity of 7 uV/mm, display speed of 1mm/sec with a 60Hz  notched filter applied as appropriate. EEG data were recorded continuously and digitally stored.  Video monitoring  was available and reviewed as appropriate. Description: Patient was noted to have episodes of sudden eye opening with whole body jerking lasting 8-10seconds every few seconds.  Concomitant EEG showed generalized polyspikes consistent with myoclonic seizures. In between seizures EEG showed generalized background suppression. Hyperventilation and photic stimulation were not performed.   ABNORMALITY - Myoclonic seizure, generalized - Background suppression, generalized IMPRESSION: Patient was noted to have myoclonic seizures every few seconds.  Additionally there was evidence of profound diffuse encephalopathy.  In the setting of cardiac arrest, this EEG pattern is suggestive of anoxic/hypoxic brain injury. Dr. Harold was notified. Priyanka O Yadav    Medications: Infusions:  amiodarone  30 mg/hr (04/28/24 1000)   epinephrine  3 mcg/min (04/28/24 1000)   heparin  10,000 units/ 20 mL infusion syringe 500 Units/hr (04/27/24 1429)   midazolam  4 mg/hr (04/28/24 1000)   piperacillin -tazobactam (ZOSYN )  IV Stopped (04/28/24 0656)   prismasol  BGK 4/2.5 400 mL/hr at 04/28/24 0511   prismasol  BGK 4/2.5 400 mL/hr at 04/28/24 0519   prismasol  BGK 4/2.5 1,500 mL/hr at 04/28/24 1003   propofol  (DIPRIVAN ) infusion 55 mcg/kg/min (04/28/24 1000)    Scheduled Medications:  Chlorhexidine  Gluconate Cloth  6 each Topical Daily   collagenase    Topical Daily   heparin   5,000 Units Subcutaneous Q8H   insulin  aspart  0-9 Units Subcutaneous Q4H   levETIRAcetam   500 mg Intravenous Q12H   linezolid   600 mg Per Tube  Q12H   mouth rinse  15 mL Mouth Rinse Q2H   pantoprazole  (PROTONIX ) IV  40 mg Intravenous QHS    have reviewed scheduled and prn medications.  Physical Exam: General: Critically ill looking male, intubated, sedated.  Not responsive Heart:RRR, s1s2 nl Lungs: Coarse breath sound bilateral.  e Abdomen:soft,, non-distended Extremities: Bilateral lower extremities pitting edema++ Dialysis Access: Right groin TDC  Nazaiah Navarrete Prasad Donyel Castagnola 04/28/2024,10:19 AM  LOS: 3 days

## 2024-04-28 NOTE — Progress Notes (Signed)
" °   04/28/24 1235  Spiritual Encounters  Type of Visit Initial  Care provided to: Pt and family  Reason for visit End-of-life  OnCall Visit Yes   Chaplain responded to page from nurse to be with family of patient who was EOL. Chaplain visited with family and upon request, prayed with Patient and his family. Chaplain remains available upon request. Per family, when Patient is extubated tomorrow morning, they would like a chaplain to be present.  Chaplain Therisa Samuel    "

## 2024-04-28 NOTE — Progress Notes (Addendum)
 "  NAME:  Calvin Schultz, MRN:  [id number], DOB:  1950/05/25, LOS: 3 ADMISSION DATE:  04/25/2024, CONSULTATION DATE:  04/25/2024 REFERRING MD:  , CHIEF COMPLAINT:  Cardiac arrest  History of Present Illness:  73 y/o male with PMH for DMT2, ESRD on HD T/TH/S, HTN, A fib, HLD, Chronic LE wounds, C diff and edema, who was in his usual state of health and with his wife and went to turn off the fireplace and suddenly went blank and wife pulled him out of his wheelchair and started CPR and called EMS who continued CPR.  Initial reports suggest PEA arrest.  He was dialysis yesterday, normally MWF and recently switched to T/TH/S.  Intubated in the field.  In the ED he was found to have K: 2.5, Cr 2.90, BNP 640, Trop 149 and LA 5.3.  He was placed on Epi drip.,  POCUS at bedside, no tamponade, LV function seems intact, enlarged right heart and septum irregularity.  Pertinent  Medical History  DMT2, ESRD on HD T/TH/S, HTN, A fib, HLD, Chronic LE wounds, C diff and edema  Significant Hospital Events: Including procedures, antibiotic start and stop dates in addition to other pertinent events   12/22: Admit to ICU due to [facility]  12/23 Patient intubated, on epi, prop, hypokalemic, no cough/gag  12/24 EEG showing myoclonus at the beginning of EEG, sedation adjusted, myoclonus has improved,   Interim History / Subjective:  No more myoclonus, though patient remains deeply sedated with propofol  and Versed  infusion EEG showed diffuse encephalopathy Remain on epinephrine  and amiodarone  infusion   Objective    Blood pressure (!) 109/49, pulse 62, temperature 99.1 F (37.3 C), resp. rate 18, height 5' 8 (1.727 m), weight 101.9 kg, SpO2 100%.    Vent Mode: PRVC FiO2 (%):  [40 %] 40 % Set Rate:  [20 bmp] 20 bmp Vt Set:  [550 mL] 550 mL PEEP:  [5 cmH20] 5 cmH20 Plateau Pressure:  [23 cmH20-30 cmH20] 28 cmH20   Intake/Output Summary (Last 24 hours) at 04/28/2024 1132 Last data filed at 04/28/2024 1100 Gross per  24 hour  Intake 1751.76 ml  Output 2826.2 ml  Net -1074.44 ml   Filed Weights   04/26/24 0324 04/27/24 0457 04/28/24 0407  Weight: 100.7 kg 106.3 kg 101.9 kg    Examination: General: Crtitically ill-appearing elderly male, orally intubated HEENT: [place]/AT, eyes anicteric.  ETT and OGT in place Neuro: Sedated, not following commands.  Eyes are closed.  Pupils 2 mm bilateral, nonreactive to light, absent cough and gag Chest: Coarse breath sounds, no wheezes or rhonchi Heart: Regular rate and rhythm, no murmurs or gallops Abdomen: Soft, nondistended, bowel sounds present Extremities: Anasarca, bilateral lower extremity wounds noted with dressing in place Skin: see images below, multiple skin tears, unstageable wounds lower extremities and sacrum, POA           Resolved problem list  Hypokalemia resolved   Assessment and Plan  Status post out-of-hospital PEA cardiac arrest Severe anoxic brain injury Anoxic encephalopathy Status myoclonus Hypokalemia, resolved End-stage renal disease on CRRT Septic shock due to bilateral multifocal pneumonia Acute urinary tract infection with Pseudomonas Lactic acidosis Multiple skin tear/pressure injuries, POA  MRI brain consistent with diffuse severe anoxic brain injury Goals of care discussion carried with family, they would like to proceed with comfort care and palliative extubation tomorrow Continue supportive care Discontinue LTM Continue CRRT for now, if filter clogs then. Continue vasopressor support for now Stop Zyvox  Continue Zosyn   Remain DNR/DNI  Labs   CBC: Recent Labs  Lab 04/25/24 2217 04/25/24 2218 04/26/24 0438 04/26/24 1831 04/27/24 0444 04/27/24 1010 04/28/24 0415  WBC 34.3*  --  46.9*  --  44.7*  --  37.7*  HGB 8.6*   < > 8.8* 10.2* 8.3* 8.8* 8.5*  HCT 30.5*   < > 29.1* 30.0* 26.3* 26.0* 27.2*  MCV 97.4  --  92.1  --  87.7  --  88.6  PLT 272  --  201  --  238  --  259   < > = values in this interval  not displayed.    Basic Metabolic Panel: Recent Labs  Lab 04/25/24 2222 04/26/24 0015 04/26/24 0438 04/26/24 1201 04/26/24 1831 04/27/24 0444 04/27/24 1010 04/27/24 1730 04/28/24 0415  NA  --    < > 138 137 134* 135 132* 134* 135  K  --    < > 2.4* 3.7 4.3 5.0 4.5 4.5 4.6  CL  --   --  95* 95*  --  94*  --  95* 98  CO2  --   --  21* 20*  --  21*  --  22 22  GLUCOSE  --   --  195* 149*  --  150*  --  104* 90  BUN  --   --  20 22  --  27*  --  26* 21  CREATININE  --   --  3.08* 3.12*  --  3.32*  --  2.95* 2.28*  CALCIUM   --   --  8.9 9.0  --  9.0  --  8.8* 8.6*  MG 1.9  --  1.7 2.0  --   --   --   --  2.0  PHOS  --   --  3.4  --   --   --   --  4.2 3.4   < > = values in this interval not displayed.   GFR: Estimated Creatinine Clearance: 34.4 mL/min (A) (by C-G formula based on SCr of 2.28 mg/dL (H)). Recent Labs  Lab 04/25/24 2217 04/25/24 2218 04/26/24 0438 04/26/24 0518 04/26/24 0945 04/26/24 1230 04/26/24 1827 04/27/24 0444 04/28/24 0415  WBC 34.3*  --  46.9*  --   --   --   --  44.7* 37.7*  LATICACIDVEN  --    < >  --  4.2* 3.8* 3.8* 1.7  --   --    < > = values in this interval not displayed.    Liver Function Tests: Recent Labs  Lab 04/25/24 2217 04/26/24 0438 04/27/24 1730 04/28/24 0415  AST 20 21  --   --   ALT 9 14  --   --   ALKPHOS 145* 135*  --   --   BILITOT 0.3 0.5  --   --   PROT 5.5* 5.2*  --   --   ALBUMIN  2.6* 2.6* 2.5* 2.6*   No results for input(s): LIPASE, AMYLASE in the last 168 hours. No results for input(s): AMMONIA in the last 168 hours.  ABG    Component Value Date/Time   PHART 7.458 (H) 04/27/2024 1010   PCO2ART 29.3 (L) 04/27/2024 1010   PO2ART 171 (H) 04/27/2024 1010   HCO3 21.0 04/27/2024 1010   TCO2 22 04/27/2024 1010   ACIDBASEDEF 3.0 (H) 04/27/2024 1010   O2SAT 100 04/27/2024 1010     Coagulation Profile: No results for input(s): INR, PROTIME in the last 168 hours.  Cardiac Enzymes: No results  for  input(s): CKTOTAL, CKMB, CKMBINDEX, TROPONINI in the last 168 hours.  HbA1C: Hgb A1c MFr Bld  Date/Time Value Ref Range Status  04/27/2024 04:41 AM 5.3 4.8 - 5.6 % Final    Comment:    (NOTE) Diagnosis of Diabetes The following HbA1c ranges recommended by the American Diabetes Association (ADA) may be used as an aid in the diagnosis of diabetes mellitus.  Hemoglobin             Suggested A1C NGSP%              Diagnosis  <5.7                   Non Diabetic  5.7-6.4                Pre-Diabetic  >6.4                   Diabetic  <7.0                   Glycemic control for                       adults with diabetes.    01/15/2024 08:58 AM 5.6 4.8 - 5.6 % Final    Comment:    (NOTE) Diagnosis of Diabetes The following HbA1c ranges recommended by the American Diabetes Association (ADA) may be used as an aid in the diagnosis of diabetes mellitus.  Hemoglobin             Suggested A1C NGSP%              Diagnosis  <5.7                   Non Diabetic  5.7-6.4                Pre-Diabetic  >6.4                   Diabetic  <7.0                   Glycemic control for                       adults with diabetes.      CBG: Recent Labs  Lab 04/27/24 2016 04/28/24 0052 04/28/24 0416 04/28/24 0748 04/28/24 1122  GLUCAP 92 92 91 75 90    The patient is critically ill due to out-of-hospital cardiac arrest/severe anoxic brain injury.  Critical care was necessary to treat or prevent imminent or life-threatening deterioration.  Critical care was time spent personally by me on the following activities: development of treatment plan with patient and/or surrogate as well as nursing, discussions with consultants, evaluation of patient's response to treatment, examination of patient, obtaining history from patient or surrogate, ordering and performing treatments and interventions, ordering and review of laboratory studies, ordering and review of radiographic studies, pulse  oximetry, re-evaluation of patient's condition and participation in multidisciplinary rounds.   During this encounter critical care time was devoted to patient care services described in this note for 40 minutes.     Valinda Novas, MD [facility] See Amion for pager If no response to pager, please call [phone] until 7pm After 7pm, Please call E-link [phone]  "

## 2024-04-28 NOTE — Progress Notes (Signed)
 Patient was transported too and from MRI without complications

## 2024-04-28 NOTE — Progress Notes (Signed)
 LTM EEG discontinued - no skin breakdown at [facility].

## 2024-04-29 LAB — RENAL FUNCTION PANEL
Albumin: 2.6 g/dL — ABNORMAL LOW (ref 3.5–5.0)
Anion gap: 11 (ref 5–15)
BUN: 11 mg/dL (ref 8–23)
CO2: 24 mmol/L (ref 22–32)
Calcium: 8.2 mg/dL — ABNORMAL LOW (ref 8.9–10.3)
Chloride: 99 mmol/L (ref 98–111)
Creatinine, Ser: 1.27 mg/dL — ABNORMAL HIGH (ref 0.61–1.24)
GFR, Estimated: >60 mL/min
Glucose, Bld: 79 mg/dL (ref 70–99)
Phosphorus: 2 mg/dL — ABNORMAL LOW (ref 2.5–4.6)
Potassium: 4.4 mmol/L (ref 3.5–5.1)
Sodium: 134 mmol/L — ABNORMAL LOW (ref 135–145)

## 2024-04-29 LAB — CBC
HCT: 27.2 % — ABNORMAL LOW (ref 39.0–52.0)
Hemoglobin: 8.4 g/dL — ABNORMAL LOW (ref 13.0–17.0)
MCH: 27.4 pg (ref 26.0–34.0)
MCHC: 30.9 g/dL (ref 30.0–36.0)
MCV: 88.6 fL (ref 80.0–100.0)
Platelets: 217 K/uL (ref 150–400)
RBC: 3.07 MIL/uL — ABNORMAL LOW (ref 4.22–5.81)
RDW: 16.6 % — ABNORMAL HIGH (ref 11.5–15.5)
WBC: 30.2 K/uL — ABNORMAL HIGH (ref 4.0–10.5)
nRBC: 0.1 % (ref 0.0–0.2)

## 2024-04-29 LAB — CULTURE, RESPIRATORY W GRAM STAIN: Culture: NORMAL

## 2024-04-29 LAB — APTT: aPTT: 49 s — ABNORMAL HIGH (ref 24–36)

## 2024-04-29 LAB — GLUCOSE, CAPILLARY
Glucose-Capillary: 80 mg/dL (ref 70–99)
Glucose-Capillary: 80 mg/dL (ref 70–99)

## 2024-04-29 LAB — MAGNESIUM: Magnesium: 2.1 mg/dL (ref 1.7–2.4)

## 2024-04-29 LAB — TRIGLYCERIDES: Triglycerides: 120 mg/dL

## 2024-04-29 MED ORDER — GLYCOPYRROLATE 0.2 MG/ML IJ SOLN: 0.2000 mg | INTRAMUSCULAR | Status: DC | PRN

## 2024-04-29 MED ORDER — GLYCOPYRROLATE 1 MG PO TABS: 1.0000 mg | ORAL_TABLET | ORAL | Status: DC | PRN

## 2024-04-29 MED ORDER — FENTANYL 2500MCG IN NS 250ML (10MCG/ML) PREMIX INFUSION
0.0000 ug/h | INTRAVENOUS | Status: DC
  Administered 2024-04-29: 50 ug/h via INTRAVENOUS
  Filled 2024-04-29: qty 250

## 2024-04-29 MED ORDER — ACETAMINOPHEN 650 MG RE SUPP: 650.0000 mg | Freq: Four times a day (QID) | RECTAL | Status: DC | PRN

## 2024-04-29 MED ORDER — POLYVINYL ALCOHOL 1.4 % OP SOLN: 1.0000 [drp] | Freq: Four times a day (QID) | OPHTHALMIC | Status: DC | PRN

## 2024-04-29 MED ORDER — ONDANSETRON 4 MG PO TBDP: 4.0000 mg | ORAL_TABLET | Freq: Four times a day (QID) | ORAL | Status: DC | PRN

## 2024-04-29 MED ORDER — HALOPERIDOL LACTATE 5 MG/ML IJ SOLN: 2.5000 mg | INTRAMUSCULAR | Status: DC | PRN

## 2024-04-29 MED ORDER — ACETAMINOPHEN 325 MG PO TABS: 650.0000 mg | ORAL_TABLET | Freq: Four times a day (QID) | ORAL | Status: DC | PRN

## 2024-04-29 MED ORDER — FENTANYL BOLUS VIA INFUSION
100.0000 ug | INTRAVENOUS | Status: DC | PRN
  Administered 2024-04-29: 100 ug via INTRAVENOUS

## 2024-04-29 MED ORDER — ONDANSETRON HCL 4 MG/2ML IJ SOLN: 4.0000 mg | Freq: Four times a day (QID) | INTRAMUSCULAR | Status: DC | PRN

## 2024-04-29 MED ORDER — DIPHENHYDRAMINE HCL 50 MG/ML IJ SOLN: 25.0000 mg | INTRAMUSCULAR | Status: DC | PRN

## 2024-04-30 LAB — CULTURE, BLOOD (ROUTINE X 2)
Culture: NO GROWTH
Culture: NO GROWTH
Special Requests: ADEQUATE
Special Requests: ADEQUATE

## 2024-04-30 LAB — URINE CULTURE: Culture: 30000 — AB

## 2024-05-05 NOTE — Progress Notes (Signed)
 Nutrition Brief Note  Chart reviewed. Pt now transitioning to comfort care.  No further nutrition interventions planned at this time.  Please re-consult as needed.   Cammy Copa., RD, LDN, CNSC See AMiON for contact information

## 2024-05-05 NOTE — Progress Notes (Signed)
 Contacted [facility] to be advised of pt's passing.   Randine Mungo Dialysis Navigator [phone]

## 2024-05-05 NOTE — IPAL (Signed)
" °  Interdisciplinary Goals of Care Family Meeting   Date carried out:: 04/30/2024  Location of the meeting: Bedside  Member's involved: Physician, Bedside Registered Nurse, and Family Member or next of kin  Durable Power of Attorney or acting medical decision maker: wife  Discussion: We discussed goals of care for Calvin Schultz .  Multiple family members present for withdrawal of care this morning as previously discussed with Dr. Harold. Discussed plan to d/c CRRT then MV and plans to treat all symptoms with medications. All questions answered.   Code status: Full DNR  Disposition: In-patient comfort care   Time spent for the meeting: 6 min.  Calvin Schultz 04-30-2024, 8:34 AM  "

## 2024-05-05 NOTE — Death Summary Note (Signed)
 " DEATH SUMMARY   Patient Details  Name: Calvin Schultz MRN: [id number] DOB: [birth year]  Admission/Discharge Information   Admit Date:  05-16-2024  Date of Death: Date of Death: 2024-05-20  Time of Death: Time of Death: 0922  Length of Stay: 4  Referring Physician: Jesus Elberta Gainer, FNP   Reason(s) for Hospitalization  Cardiac arrest  Diagnoses  Preliminary cause of death: anoxic brain injury Secondary Diagnoses (including complications and co-morbidities):  Principal Problem:   Cardiac arrest (HCC) Status post out-of-hospital PEA cardiac arrest Severe anoxic brain injury Anoxic encephalopathy Status myoclonus Hypokalemia, resolved End-stage renal disease on CRRT Septic shock due to bilateral multifocal pneumonia Acute urinary tract infection with Pseudomonas and Klebsiella  Lactic acidosis Multiple skin tear/pressure injuries, POA Hyponatremia Hypophosphatemia Anemia Aspiration pneumonia Acute respiratory failure with hypoxia requiring mechanical ventilation  Brief Hospital Course (including significant findings, care, treatment, and services provided and events leading to death)  Aragon Scarantino is a 74 y.o. year old male with PMH for DMT2, ESRD on HD T/TH/S, HTN, A fib, HLD, Chronic LE wounds, C diff and edema, who was in his usual state of health and with his wife and went to turn off the fireplace and suddenly went blank and wife pulled him out of his wheelchair and started CPR and called EMS who continued CPR. Initial reports suggest PEA arrest. He was dialysis yesterday, normally MWF and recently switched to T/TH/S. Intubated in the field. In the ED he was found to have K: 2.5, Cr 2.90, BNP 640, Trop 149 and LA 5.3. He was placed on Epi drip., POCUS at bedside, no tamponade, LV function seems intact, enlarged right heart and septum irregularity.   During his hospital stay he developed myoclonus and required heavy sedation with propofol  and versed . He had EEG and MRI  evidence of diffuse anoxic brain injury.   He was supported with CRRT for his chronic renal failure and epinephrine  for hypotension.  He was maintained mechanical ventilation for respiratory support.  He was given antibiotics for UTI and pneumonia-linezolid  and Zosyn .  Due to his poor prognosis with his anoxic brain injury family elected to withdraw aggressive support.  He passed away with family at bedside on 05-20-24.  Pertinent Labs and Studies  Significant Diagnostic Studies MR BRAIN WO CONTRAST Result Date: 04/28/2024 EXAM: MRI BRAIN WITHOUT CONTRAST 04/28/2024 02:55:00 AM TECHNIQUE: Multiplanar multisequence MRI of the head/brain was performed without the administration of intravenous contrast. COMPARISON: CT head 04/26/2024 CLINICAL HISTORY: Anoxic brain damage FINDINGS: BRAIN AND VENTRICLES: Diffuse supratentorial and cerebellar cortical restricted diffusion, compatible with hypoxic ischemic injury. Edema and sulcal effacement. Basal cisterns are patent. No midline shift. No intracranial hemorrhage. No mass. No hydrocephalus. Patchy T2 hyperintensities in the white matter are compatible with chornic microvascular ischemic change. Normal flow voids. ORBITS: No acute abnormality. SINUSES AND MASTOIDS: No acute abnormality. BONES AND SOFT TISSUES: Normal marrow signal. No acute soft tissue abnormality. IMPRESSION: 1. Findings compatible with diffuse acute hypoxic-ischemic injury, as detailed above. Electronically signed by: Gilmore Molt 04/28/2024 03:35 AM EST RP Workstation: [id number]   Overnight EEG with video Result Date: 04/27/2024 [name], MD     04/28/2024  6:19 AM Patient Name: Calvin Schultz MRN: [id number] Epilepsy Attending: Arlin KIDD Shelton Referring Physician/Provider:  Dr Valinda Novas Duration: 04/26/2024 1202 to 04/27/2024 05/31/1200  Patient history: 73yo M s/p cardiac arrest. EEG to evaluate for seizure  Level of alertness: comatose  AEDs during EEG study: Propofol   Technical  aspects: This EEG study was  done with scalp electrodes positioned according to the 10-20 International system of electrode placement. Electrical activity was reviewed with band pass filter of 1-70Hz , sensitivity of 7 uV/mm, display speed of 64mm/sec with a 60Hz  notched filter applied as appropriate. EEG data were recorded continuously and digitally stored.  Video monitoring was available and reviewed as appropriate.  Description: At the beginning of the study, patient was noted to have episodes of sudden eye opening with whole body jerking lasting 8-10seconds every few seconds. Concomitant EEG showed generalized polyspikes consistent with myoclonic seizures. In between seizures EEG showed generalized background suppression. Gradually as medications were adjusted, seizures abated and eeg showed burst suppression with bursts of generalized 4-5hz  theta slowing lasting 0.5 to 1 second with interburst interval of 3-5 seconds. Hyperventilation and photic stimulation were not performed.    ABNORMALITY - Myoclonic seizure, generalized - Burst suppression, generalized  IMPRESSION: At the beginning of the study, patient was noted to have myoclonic seizures every few seconds. Gradually as medications were adjusted, seizures abated. Subsequently, there was profound diffuse encephalopathy.   Arlin MALVA Krebs    ECHOCARDIOGRAM COMPLETE Result Date: 04/26/2024    ECHOCARDIOGRAM REPORT   Patient Name:   Calvin Schultz Date of Exam: 04/26/2024 Medical Rec #:  [id number]   Height:       68.0 in Accession #:    [id number]  Weight:       222.0 lb Date of Birth:  1950/10/11  BSA:          2.136 m Patient Age:    73 years    BP:           115/66 mmHg Patient Gender: M           HR:           83 bpm. Exam Location:  Inpatient Procedure: 2D Echo, Cardiac Doppler, Color Doppler and Intracardiac            Opacification Agent (Both Spectral and Color Flow Doppler were            utilized during procedure). Indications:    Cardiac Arrest   History:        Patient has prior history of Echocardiogram examinations, most                 recent 01/17/2024. Arrythmias:Atrial Fibrillation; Risk                 Factors:Hypertension and Diabetes.  Sonographer:    Philomena Daring Referring Phys: [id number] STEPHANIE M REESE IMPRESSIONS  1. Left ventricular ejection fraction, by estimation, is 50 to 55%. The left ventricle has low normal function. The left ventricle has no regional wall motion abnormalities. There is mild concentric left ventricular hypertrophy. Left ventricular diastolic parameters are indeterminate. There is the interventricular septum is flattened in systole, consistent with right ventricular pressure overload.  2. Right ventricular systolic function is moderately reduced. The right ventricular size is moderately enlarged. There is normal pulmonary artery systolic pressure. The estimated right ventricular systolic pressure is 27.1 mmHg.  3. Left atrial size was mild to moderately dilated.  4. Right atrial size was moderately dilated.  5. The mitral valve is normal in structure. Trivial mitral valve regurgitation. No evidence of mitral stenosis.  6. The aortic valve is tricuspid. There is mild calcification of the aortic valve. Aortic valve regurgitation is not visualized. Aortic valve sclerosis/calcification is present, without any evidence of aortic stenosis.  7. Aortic dilatation noted. There is mild dilatation  of the ascending aorta, measuring 41 mm.  8. The inferior vena cava is dilated in size with <50% respiratory variability, suggesting right atrial pressure of 15 mmHg. FINDINGS  Left Ventricle: Left ventricular ejection fraction, by estimation, is 50 to 55%. The left ventricle has low normal function. The left ventricle has no regional wall motion abnormalities. Definity  contrast agent was given IV to delineate the left ventricular endocardial borders. The left ventricular internal cavity size was normal in size. There is mild concentric left  ventricular hypertrophy. The interventricular septum is flattened in systole, consistent with right ventricular pressure overload. Left ventricular diastolic parameters are indeterminate. Right Ventricle: The right ventricular size is moderately enlarged. No increase in right ventricular wall thickness. Right ventricular systolic function is moderately reduced. There is normal pulmonary artery systolic pressure. The tricuspid regurgitant velocity is 1.74 m/s, and with an assumed right atrial pressure of 15 mmHg, the estimated right ventricular systolic pressure is 27.1 mmHg. Left Atrium: Left atrial size was mild to moderately dilated. Right Atrium: Right atrial size was moderately dilated. Pericardium: There is no evidence of pericardial effusion. Mitral Valve: The mitral valve is normal in structure. Trivial mitral valve regurgitation. No evidence of mitral valve stenosis. Tricuspid Valve: The tricuspid valve is normal in structure. Tricuspid valve regurgitation is mild . No evidence of tricuspid stenosis. Aortic Valve: The aortic valve is tricuspid. There is mild calcification of the aortic valve. Aortic valve regurgitation is not visualized. Aortic valve sclerosis/calcification is present, without any evidence of aortic stenosis. Pulmonic Valve: The pulmonic valve was normal in structure. Pulmonic valve regurgitation is trivial. No evidence of pulmonic stenosis. Aorta: Aortic dilatation noted. There is mild dilatation of the ascending aorta, measuring 41 mm. Venous: The inferior vena cava is dilated in size with less than 50% respiratory variability, suggesting right atrial pressure of 15 mmHg. IAS/Shunts: No atrial level shunt detected by color flow Doppler.  LEFT VENTRICLE PLAX 2D LVIDd:         5.70 cm   Diastology LVIDs:         4.20 cm   LV e' medial:    8.70 cm/s LV PW:         1.20 cm   LV E/e' medial:  16.1 LV IVS:        1.20 cm   LV e' lateral:   10.60 cm/s LVOT diam:     2.20 cm   LV E/e' lateral:  13.2 LV SV:         58 LV SV Index:   27 LVOT Area:     3.80 cm  RIGHT VENTRICLE            IVC RV S prime:     9.03 cm/s  IVC diam: 2.40 cm TAPSE (M-mode): 1.5 cm LEFT ATRIUM             Index        RIGHT ATRIUM           Index LA diam:        5.20 cm 2.43 cm/m   RA Area:     24.20 cm LA Vol (A2C):   97.5 ml 45.64 ml/m  RA Volume:   72.30 ml  33.84 ml/m LA Vol (A4C):   66.0 ml 30.89 ml/m LA Biplane Vol: 81.4 ml 38.10 ml/m  AORTIC VALVE LVOT Vmax:   94.75 cm/s LVOT Vmean:  60.400 cm/s LVOT VTI:    0.153 m  AORTA Ao Root diam: 3.70 cm  Ao Asc diam:  4.10 cm MITRAL VALVE                TRICUSPID VALVE MV Area (PHT): 3.45 cm     TR Peak grad:   12.1 mmHg MV Decel Time: 220 msec     TR Vmax:        174.00 cm/s MV E velocity: 140.33 cm/s MV A velocity: 3.16 cm/s    SHUNTS MV E/A ratio:  44.41        Systemic VTI:  0.15 m                             Systemic Diam: 2.20 cm Toribio Fuel MD Electronically signed by Toribio Fuel MD Signature Date/Time: 04/26/2024/2:37:47 PM    Final    EEG adult Result Date: 04/26/2024 [name], MD     04/26/2024  1:12 PM Patient Name: Calvin Schultz MRN: [id number] Epilepsy Attending: Arlin KIDD Shelton Referring Physician/Provider: Claudene Fonda BROCKS, NP Date: 04/26/2024 Duration: 25.25 mins Patient history: 74yo M s/p cardiac arrest. EEG to evaluate for seizure Level of alertness: comatose AEDs during EEG study: Propofol  Technical aspects: This EEG study was done with scalp electrodes positioned according to the 10-20 International system of electrode placement. Electrical activity was reviewed with band pass filter of 1-70Hz , sensitivity of 7 uV/mm, display speed of 25mm/sec with a 60Hz  notched filter applied as appropriate. EEG data were recorded continuously and digitally stored.  Video monitoring was available and reviewed as appropriate. Description: Patient was noted to have episodes of sudden eye opening with whole body jerking lasting 8-10seconds every few  seconds.  Concomitant EEG showed generalized polyspikes consistent with myoclonic seizures. In between seizures EEG showed generalized background suppression. Hyperventilation and photic stimulation were not performed.   ABNORMALITY - Myoclonic seizure, generalized - Background suppression, generalized IMPRESSION: Patient was noted to have myoclonic seizures every few seconds.  Additionally there was evidence of profound diffuse encephalopathy.  In the setting of cardiac arrest, this EEG pattern is suggestive of anoxic/hypoxic brain injury. Dr. Harold was notified. Arlin KIDD Shelton   DG Chest Port 1 View Result Date: 04/26/2024 CLINICAL DATA:  Ventilator dependence. EXAM: PORTABLE CHEST 1 VIEW COMPARISON:  04/25/2024 FINDINGS: 0528 hours. Endotracheal tube tip is 3.2 cm above the base of the carina. The NG tube passes into the stomach although the distal tip position is not included on the film. The cardio pericardial silhouette is enlarged. Vascular congestion with diffuse interstitial and basilar airspace disease is similar to prior compatible with edema or infection. Small bilateral pleural effusions again noted. Telemetry leads overlie the chest. IMPRESSION: No substantial interval change. Vascular congestion with diffuse interstitial and basilar airspace disease compatible with edema or infection. Electronically Signed   By: Camellia Candle M.D.   On: 04/26/2024 06:05   CT Angio Chest PE W and/or Wo Contrast Result Date: 04/26/2024 EXAM: CTA CHEST PE WITHOUT AND WITH CONTRAST CT ABDOMEN AND PELVIS WITHOUT AND WITH CONTRAST 04/26/2024 12:54:18 AM TECHNIQUE: CTA of the chest was performed after the administration of intravenous contrast. Multiplanar reformatted images are provided for review. MIP images are provided for review. CT of the abdomen and pelvis was performed with the administration of intravenous contrast. Automated exposure control, iterative reconstruction, and/or weight based adjustment of the  mA/kV was utilized to reduce the radiation dose to as low as reasonably achievable. COMPARISON: Chest x-ray dated 04/24/2024. CLINICAL HISTORY: cardiac arrest FINDINGS: CHEST:  PULMONARY ARTERIES: Pulmonary arteries are adequately opacified for evaluation. No intraluminal filling defect to suggest pulmonary embolism. Main pulmonary artery is normal in caliber. MEDIASTINUM: Enteric tube tip in the stomach. Coronary artery and aortic atherosclerotic calcification. Cardiomegaly. Reflux of contrast into the IVC and hepatic veins compatible with elevated right heart pressures. Dilated ascending aorta measuring 42 mm. No mediastinal lymphadenopathy. LUNGS AND PLEURA: Bilateral patchy airspace opacities and consolidation greatest in the lower lobes. Findings are compatible with aspiration / bronchopneumonia. Moderate right and small left pleural effusions. No pneumothorax. SOFT TISSUES AND BONES: IO catheter in the left humerus. Nondisplaced fractures of the bilateral anterior 6th ribs. Diffuse body wall edema. ABDOMEN AND PELVIS: LIVER: No acute abnormality. GALLBLADDER AND BILE DUCTS: Cholelithiasis. No biliary ductal dilatation. SPLEEN: Spleen demonstrates no acute abnormality. PANCREAS: Pancreas demonstrates no acute abnormality. ADRENAL GLANDS: Adrenal glands demonstrate no acute abnormality. KIDNEYS, URETERS AND BLADDER: Left nephrolithiasis measuring up to 15 mm. No stones in the right kidney or ureters. No hydronephrosis. No perinephric or periureteral stranding. Foley catheter and gas in the decompressed bladder. GI AND BOWEL: Enteric tube tip in the stomach. Small stool ball in the rectum. Normal appendix. Stomach and duodenal sweep demonstrate no acute abnormality. There is no bowel obstruction. No abnormal bowel wall thickening or distension. REPRODUCTIVE: Reproductive organs are unremarkable. PERITONEUM AND RETROPERITONEUM: Right femoral CVC tip in the infrarenal IVC at the level of the liver. Small amount of  fluid in the pelvis. No ascites or free air. LYMPH NODES: No lymphadenopathy. BONES AND SOFT TISSUES: No acute abnormality of the visualized bones. No focal soft tissue abnormality. IMPRESSION: 1. Negative for pulmonary embolism. 2. Bilateral patchy airspace opacities and consolidation, greatest in the lower lobes, compatible with aspiration/bronchopneumonia, with moderate right and small left pleural effusions. 3. Nondisplaced fractures of the bilateral anterior 6th ribs. 4. Dilated ascending aorta measuring 42 mm.Recommend annual imaging followup by CTA or MRA. This recommendation follows 2010 ACCF/AHA/AATS/ACR/ASA/SCA/SCAI/SIR/STS/SVM Guidelines for the Diagnosis and Management of Patients with Thoracic Aortic Disease. Circulation. 2010; 121: Z733-z630. Aortic aneurysm NOS (ICD10-I71.9) Electronically signed by: Norman Gatlin MD 04/26/2024 01:06 AM EST RP Workstation: [id number]   CT ABDOMEN PELVIS W CONTRAST Result Date: 04/26/2024 EXAM: CTA CHEST PE WITHOUT AND WITH CONTRAST CT ABDOMEN AND PELVIS WITHOUT AND WITH CONTRAST 04/26/2024 12:54:18 AM TECHNIQUE: CTA of the chest was performed after the administration of intravenous contrast. Multiplanar reformatted images are provided for review. MIP images are provided for review. CT of the abdomen and pelvis was performed with the administration of intravenous contrast. Automated exposure control, iterative reconstruction, and/or weight based adjustment of the mA/kV was utilized to reduce the radiation dose to as low as reasonably achievable. COMPARISON: Chest x-ray dated 04/24/2024. CLINICAL HISTORY: cardiac arrest FINDINGS: CHEST: PULMONARY ARTERIES: Pulmonary arteries are adequately opacified for evaluation. No intraluminal filling defect to suggest pulmonary embolism. Main pulmonary artery is normal in caliber. MEDIASTINUM: Enteric tube tip in the stomach. Coronary artery and aortic atherosclerotic calcification. Cardiomegaly. Reflux of contrast into the  IVC and hepatic veins compatible with elevated right heart pressures. Dilated ascending aorta measuring 42 mm. No mediastinal lymphadenopathy. LUNGS AND PLEURA: Bilateral patchy airspace opacities and consolidation greatest in the lower lobes. Findings are compatible with aspiration / bronchopneumonia. Moderate right and small left pleural effusions. No pneumothorax. SOFT TISSUES AND BONES: IO catheter in the left humerus. Nondisplaced fractures of the bilateral anterior 6th ribs. Diffuse body wall edema. ABDOMEN AND PELVIS: LIVER: No acute abnormality. GALLBLADDER AND BILE DUCTS: Cholelithiasis. No biliary  ductal dilatation. SPLEEN: Spleen demonstrates no acute abnormality. PANCREAS: Pancreas demonstrates no acute abnormality. ADRENAL GLANDS: Adrenal glands demonstrate no acute abnormality. KIDNEYS, URETERS AND BLADDER: Left nephrolithiasis measuring up to 15 mm. No stones in the right kidney or ureters. No hydronephrosis. No perinephric or periureteral stranding. Foley catheter and gas in the decompressed bladder. GI AND BOWEL: Enteric tube tip in the stomach. Small stool ball in the rectum. Normal appendix. Stomach and duodenal sweep demonstrate no acute abnormality. There is no bowel obstruction. No abnormal bowel wall thickening or distension. REPRODUCTIVE: Reproductive organs are unremarkable. PERITONEUM AND RETROPERITONEUM: Right femoral CVC tip in the infrarenal IVC at the level of the liver. Small amount of fluid in the pelvis. No ascites or free air. LYMPH NODES: No lymphadenopathy. BONES AND SOFT TISSUES: No acute abnormality of the visualized bones. No focal soft tissue abnormality. IMPRESSION: 1. Negative for pulmonary embolism. 2. Bilateral patchy airspace opacities and consolidation, greatest in the lower lobes, compatible with aspiration/bronchopneumonia, with moderate right and small left pleural effusions. 3. Nondisplaced fractures of the bilateral anterior 6th ribs. 4. Dilated ascending aorta  measuring 42 mm.Recommend annual imaging followup by CTA or MRA. This recommendation follows 2010 ACCF/AHA/AATS/ACR/ASA/SCA/SCAI/SIR/STS/SVM Guidelines for the Diagnosis and Management of Patients with Thoracic Aortic Disease. Circulation. 2010; 121: Z733-z630. Aortic aneurysm NOS (ICD10-I71.9) Electronically signed by: Norman Gatlin MD 04/26/2024 01:06 AM EST RP Workstation: [id number]   CT Head Wo Contrast Result Date: 04/26/2024 CLINICAL DATA:  Provided history: Mental status change, unknown cause s/p arrest, altered ms EXAM: CT HEAD WITHOUT CONTRAST TECHNIQUE: Contiguous axial images were obtained from the base of the skull through the vertex without intravenous contrast. RADIATION DOSE REDUCTION: This exam was performed according to the departmental dose-optimization program which includes automated exposure control, adjustment of the mA and/or kV according to patient size and/or use of iterative reconstruction technique. COMPARISON:  Head CT 01/14/2024 FINDINGS: Brain: No intracranial hemorrhage, mass effect, or midline shift. Stable degree of atrophy and chronic small vessel ischemia. No hydrocephalus. The basilar cisterns are patent. No evidence of cerebral edema. No evidence of territorial infarct or acute ischemia. No extra-axial or intracranial fluid collection. Vascular: Atherosclerosis of skullbase vasculature without hyperdense vessel or abnormal calcification. Skull: No fracture or focal lesion. Sinuses/Orbits: Mucous retention cyst in the right maxillary sinus. No acute findings. Other: None. IMPRESSION: 1. No acute intracranial abnormality. 2. Stable atrophy and chronic small vessel ischemia. Electronically Signed   By: Andrea Gasman M.D.   On: 04/26/2024 01:04   DG Abd Portable 1 View Result Date: 04/25/2024 CLINICAL DATA:  Nasogastric tube placement. EXAM: PORTABLE ABDOMEN - 1 VIEW COMPARISON:  07/01/2023 FINDINGS: Tip and side port of the enteric tube below the diaphragm in the  stomach. There is gaseous gastric distension. Presumed dialysis catheter from an inferior approach overlies the expected upper IVC. Previous left-sided drainage catheter is no longer seen. Suspected left renal calculi. IMPRESSION: 1. Tip and side port of the enteric tube below the diaphragm in the stomach. 2. Gaseous gastric distension. Electronically Signed   By: Andrea Gasman M.D.   On: 04/25/2024 22:59   DG Chest Port 1 View Result Date: 04/25/2024 CLINICAL DATA:  Unresponsive.  Intubated. EXAM: PORTABLE CHEST 1 VIEW COMPARISON:  01/23/2024 FINDINGS: Endotracheal tube tip is 2.7 cm from the carina. Enteric tube tip below the diaphragm not included in the field of view. Cardiomegaly is stable. Unchanged mediastinal contours. Aortic atherosclerosis. Bilateral pleural effusions and bibasilar opacities, worsened from prior exam. Vascular congestion. No pneumothorax.  IMPRESSION: 1. Endotracheal tube tip 2.7 cm from the carina. 2. Bilateral pleural effusions and bibasilar opacities, worsened from prior exam. 3. Cardiomegaly and vascular congestion. Electronically Signed   By: Andrea Gasman M.D.   On: 04/25/2024 22:58    Microbiology Recent Results (from the past 240 hours)  Urine Culture     Status: Abnormal (Preliminary result)   Collection Time: 04/25/24 10:14 PM   Specimen: Urine, Catheterized  Result Value Ref Range Status   Specimen Description URINE, CATHETERIZED  Final   Special Requests   Final    NONE Performed at [facility], [address] [place], [place] [address]    Culture (A)  Final    30,000 COLONIES/mL KLEBSIELLA AEROGENES 10,000 COLONIES/mL PSEUDOMONAS AERUGINOSA SUSCEPTIBILITIES TO FOLLOW 20,000 COLONIES/mL YEAST    Report Status PENDING  Incomplete   Organism ID, Bacteria KLEBSIELLA AEROGENES (A)  Final      Susceptibility   Klebsiella aerogenes - MIC*    CEFEPIME  <=0.12 SENSITIVE Sensitive     ERTAPENEM <=0.12 SENSITIVE Sensitive     CEFTRIAXONE  <=0.25  SENSITIVE Sensitive     CIPROFLOXACIN <=0.06 SENSITIVE Sensitive     GENTAMICIN <=1 SENSITIVE Sensitive     NITROFURANTOIN 128 RESISTANT Resistant     TRIMETH /SULFA  <=20 SENSITIVE Sensitive     PIP/TAZO Value in next row Sensitive      <=4 SENSITIVEThis is a modified FDA-approved test that has been validated and its performance characteristics determined by the reporting laboratory.  This laboratory is certified under the Clinical Laboratory Improvement Amendments CLIA as qualified to perform high complexity clinical laboratory testing.    MEROPENEM Value in next row Sensitive      <=4 SENSITIVEThis is a modified FDA-approved test that has been validated and its performance characteristics determined by the reporting laboratory.  This laboratory is certified under the Clinical Laboratory Improvement Amendments CLIA as qualified to perform high complexity clinical laboratory testing.    * 30,000 COLONIES/mL KLEBSIELLA AEROGENES  Blood culture (routine x 2)     Status: None (Preliminary result)   Collection Time: 04/25/24 10:34 PM   Specimen: BLOOD LEFT HAND  Result Value Ref Range Status   Specimen Description BLOOD LEFT HAND  Final   Special Requests AEROBIC BOTTLE ONLY Blood Culture adequate volume  Final   Culture   Final    NO GROWTH 4 DAYS Performed at [facility], [address] [place], [place] [address]    Report Status PENDING  Incomplete  Blood culture (routine x 2)     Status: None (Preliminary result)   Collection Time: 04/25/24 10:34 PM   Specimen: BLOOD LEFT ARM  Result Value Ref Range Status   Specimen Description BLOOD LEFT ARM  Final   Special Requests   Final    BOTTLES DRAWN AEROBIC AND ANAEROBIC Blood Culture adequate volume   Culture   Final    NO GROWTH 4 DAYS Performed at [facility], [address] [place], [place] [address]    Report Status PENDING  Incomplete  Resp panel by RT-PCR (RSV, Flu A&B, Covid) Anterior Nasal Swab     Status: None    Collection Time: 04/25/24 11:24 PM   Specimen: Anterior Nasal Swab  Result Value Ref Range Status   SARS Coronavirus 2 by RT PCR NEGATIVE NEGATIVE Final   Influenza A by PCR NEGATIVE NEGATIVE Final   Influenza B by PCR NEGATIVE NEGATIVE Final    Comment: (NOTE) The Xpert Xpress  SARS-CoV-2/FLU/RSV plus assay is intended as an aid in the diagnosis of influenza from Nasopharyngeal swab specimens and should not be used as a sole basis for treatment. Nasal washings and aspirates are unacceptable for Xpert Xpress SARS-CoV-2/FLU/RSV testing.  Fact Sheet for Patients: bloggercourse.com  Fact Sheet for Healthcare Providers: seriousbroker.it  This test is not yet approved or cleared by the [place]  FDA and has been authorized for detection and/or diagnosis of SARS-CoV-2 by FDA under an Emergency Use Authorization (EUA). This EUA will remain in effect (meaning this test can be used) for the duration of the COVID-19 declaration under Section 564(b)(1) of the Act, 21 U.S.C. section 360bbb-3(b)(1), unless the authorization is terminated or revoked.     Resp Syncytial Virus by PCR NEGATIVE NEGATIVE Final    Comment: (NOTE) Fact Sheet for Patients: bloggercourse.com  Fact Sheet for Healthcare Providers: seriousbroker.it  This test is not yet approved or cleared by the [place]  FDA and has been authorized for detection and/or diagnosis of SARS-CoV-2 by FDA under an Emergency Use Authorization (EUA). This EUA will remain in effect (meaning this test can be used) for the duration of the COVID-19 declaration under Section 564(b)(1) of the Act, 21 U.S.C. section 360bbb-3(b)(1), unless the authorization is terminated or revoked.  Performed at [facility], [address], [place], [place] [address]   MRSA Next Gen by PCR, Nasal     Status: None   Collection Time: 04/26/24  1:34  AM   Specimen: Nasal Mucosa; Nasal Swab  Result Value Ref Range Status   MRSA by PCR Next Gen NOT DETECTED NOT DETECTED Final    Comment: (NOTE) The GeneXpert MRSA Assay (FDA approved for NASAL specimens only), is one component of a comprehensive MRSA colonization surveillance program. It is not intended to diagnose MRSA infection nor to guide or monitor treatment for MRSA infections. Test performance is not FDA approved in patients less than 59 years old. Performed at [facility], [address], [place], [place] [address]   C Difficile Quick Screen w PCR reflex     Status: None   Collection Time: 04/26/24  6:13 AM   Specimen: STOOL  Result Value Ref Range Status   C Diff antigen NEGATIVE NEGATIVE Final   C Diff toxin NEGATIVE NEGATIVE Final   C Diff interpretation No C. difficile detected.  Final    Comment: Performed at [facility], [address] [place], [place] [address]  Culture, Respiratory w Gram Stain     Status: None (Preliminary result)   Collection Time: 04/26/24  6:20 PM   Specimen: Tracheal Aspirate; Respiratory  Result Value Ref Range Status   Specimen Description TRACHEAL ASPIRATE  Final   Special Requests NONE  Final   Gram Stain   Final    ABUNDANT WBC PRESENT, PREDOMINANTLY PMN NO ORGANISMS SEEN    Culture   Final    RARE Normal respiratory flora-no Staph aureus or Pseudomonas seen Performed at [facility], [address] [place], [place] [address]    Report Status PENDING  Incomplete    Lab Basic Metabolic Panel: Recent Labs  Lab 04/25/24 2222 04/26/24 0015 04/26/24 0438 04/26/24 1201 04/26/24 1831 04/27/24 0444 04/27/24 1010 04/27/24 1730 04/28/24 0415 04/28/24 1535 19-May-2024 0526  NA  --    < > 138 137   < > 135 132* 134* 135 135 134*  K  --    < > 2.4* 3.7   < >  5.0 4.5 4.5 4.6 4.5 4.4  CL  --   --  95* 95*  --  94*  --  95* 98 98 99  CO2  --   --  21* 20*  --  21*  --  22 22 23 24   GLUCOSE  --   --  195* 149*  --   150*  --  104* 90 85 79  BUN  --   --  20 22  --  27*  --  26* 21 16 11   CREATININE  --   --  3.08* 3.12*  --  3.32*  --  2.95* 2.28* 1.73* 1.27*  CALCIUM   --   --  8.9 9.0  --  9.0  --  8.8* 8.6* 8.3* 8.2*  MG 1.9  --  1.7 2.0  --   --   --   --  2.0  --  2.1  PHOS  --   --  3.4  --   --   --   --  4.2 3.4 2.6 2.0*   < > = values in this interval not displayed.   Liver Function Tests: Recent Labs  Lab 04/25/24 2217 04/26/24 0438 04/27/24 1730 04/28/24 0415 04/28/24 1535 May 08, 2024 0526  AST 20 21  --   --   --   --   ALT 9 14  --   --   --   --   ALKPHOS 145* 135*  --   --   --   --   BILITOT 0.3 0.5  --   --   --   --   PROT 5.5* 5.2*  --   --   --   --   ALBUMIN  2.6* 2.6* 2.5* 2.6* 2.5* 2.6*   No results for input(s): LIPASE, AMYLASE in the last 168 hours. No results for input(s): AMMONIA in the last 168 hours. CBC: Recent Labs  Lab 04/25/24 2217 04/25/24 2218 04/26/24 0438 04/26/24 1831 04/27/24 0444 04/27/24 1010 04/28/24 0415 May 08, 2024 0526  WBC 34.3*  --  46.9*  --  44.7*  --  37.7* 30.2*  HGB 8.6*   < > 8.8* 10.2* 8.3* 8.8* 8.5* 8.4*  HCT 30.5*   < > 29.1* 30.0* 26.3* 26.0* 27.2* 27.2*  MCV 97.4  --  92.1  --  87.7  --  88.6 88.6  PLT 272  --  201  --  238  --  259 217   < > = values in this interval not displayed.   Cardiac Enzymes: No results for input(s): CKTOTAL, CKMB, CKMBINDEX, TROPONINI in the last 168 hours. Sepsis Labs: Recent Labs  Lab 04/26/24 0438 04/26/24 0518 04/26/24 0945 04/26/24 1230 04/26/24 1827 04/27/24 0444 04/28/24 0415 2024-05-08 0526  WBC 46.9*  --   --   --   --  44.7* 37.7* 30.2*  LATICACIDVEN  --  4.2* 3.8* 3.8* 1.7  --   --   --     Procedures/Operations  Intubation   Leita SHAUNNA Gaskins 05/08/2024, 10:00 AM   "

## 2024-05-05 NOTE — Procedures (Signed)
 Extubation Procedure Note  Patient Details:   Name: Calvin Schultz DOB: [birth year] MRN: [id number]   Airway Documentation:    Vent end date: 05/11/2024 Vent end time: 0905   Evaluation  O2 sats: terminal extubation Complications: terminal extubation  Patient terminal extubation tolerate procedure well. Bilateral Breath Sounds: Diminished, Clear   No Pt was terminally extubated per MD order.   Laneta Dibbles 11-May-2024, 9:07 AM

## 2024-05-05 NOTE — Progress Notes (Signed)
" °  Patient asystole at 0915. Auscultated heart and lungs sounds for 2 minutes, no sounds noted. Second RN verified: [name] [name] Pt pronounced at 0922 on May 11, 2024.       Media Information   Document Information  Cardiology Imaging: Cardiac Monitoring Strip    04/27/2024 14:47  Attached To:  Hospital Encounter on 04/25/24  Source Information  Default, Provider, MD   "

## 2024-05-05 NOTE — Progress Notes (Signed)
 Chaplain was   11-May-2024 0900  Spiritual Encounters  Type of Visit Initial  Care provided to: Pt and family  Referral source Nurse (RN/NT/LPN)  Reason for visit Patient death  OnCall Visit No  Spiritual Framework  Presenting Themes Impactful experiences and emotions  Community/Connection Family  Patient Stress Factors Health changes;Major life changes  Family Stress Factors Exhausted;Health changes  Interventions  Spiritual Care Interventions Made Compassionate presence;Established relationship of care and support;Prayer;Normalization of emotions  Intervention Outcomes  Outcomes Reduced anxiety;Reduced fear;Connection to spiritual care   Chaplain was paged to support family during planned extubation. Chaplain remained present with family as they shared memories and reflected on the Pt's life. During extubation, chaplain provided emotional spiritual support to family.

## 2024-05-05 DEATH — deceased

## 2024-05-27 ENCOUNTER — Encounter (HOSPITAL_COMMUNITY): Payer: Self-pay

## 2024-05-27 ENCOUNTER — Encounter

## 2024-05-27 ENCOUNTER — Ambulatory Visit (HOSPITAL_COMMUNITY)
